# Patient Record
Sex: Female | Born: 1955
Health system: Southern US, Community
[De-identification: ages and names within clinical notes are randomized; demographics above are authoritative.]

## PROBLEM LIST (undated history)

## (undated) DIAGNOSIS — T8859XA Other complications of anesthesia, initial encounter: Secondary | ICD-10-CM

## (undated) DIAGNOSIS — R112 Nausea with vomiting, unspecified: Secondary | ICD-10-CM

## (undated) DIAGNOSIS — I10 Essential (primary) hypertension: Secondary | ICD-10-CM

## (undated) DIAGNOSIS — G8929 Other chronic pain: Secondary | ICD-10-CM

## (undated) DIAGNOSIS — F0390 Unspecified dementia without behavioral disturbance: Secondary | ICD-10-CM

## (undated) DIAGNOSIS — Z9889 Other specified postprocedural states: Secondary | ICD-10-CM

## (undated) DIAGNOSIS — E039 Hypothyroidism, unspecified: Secondary | ICD-10-CM

## (undated) DIAGNOSIS — T7840XA Allergy, unspecified, initial encounter: Secondary | ICD-10-CM

## (undated) DIAGNOSIS — G4733 Obstructive sleep apnea (adult) (pediatric): Secondary | ICD-10-CM

## (undated) DIAGNOSIS — R519 Headache, unspecified: Secondary | ICD-10-CM

## (undated) DIAGNOSIS — Z6841 Body Mass Index (BMI) 40.0 and over, adult: Secondary | ICD-10-CM

## (undated) DIAGNOSIS — J449 Chronic obstructive pulmonary disease, unspecified: Secondary | ICD-10-CM

## (undated) DIAGNOSIS — E0590 Thyrotoxicosis, unspecified without thyrotoxic crisis or storm: Secondary | ICD-10-CM

## (undated) DIAGNOSIS — R51 Headache: Secondary | ICD-10-CM

## (undated) DIAGNOSIS — R0600 Dyspnea, unspecified: Secondary | ICD-10-CM

## (undated) DIAGNOSIS — F32A Depression, unspecified: Secondary | ICD-10-CM

## (undated) DIAGNOSIS — R32 Unspecified urinary incontinence: Secondary | ICD-10-CM

## (undated) DIAGNOSIS — B49 Unspecified mycosis: Secondary | ICD-10-CM

## (undated) DIAGNOSIS — K219 Gastro-esophageal reflux disease without esophagitis: Secondary | ICD-10-CM

## (undated) DIAGNOSIS — S300XXA Contusion of lower back and pelvis, initial encounter: Secondary | ICD-10-CM

## (undated) DIAGNOSIS — J45909 Unspecified asthma, uncomplicated: Secondary | ICD-10-CM

## (undated) DIAGNOSIS — G43909 Migraine, unspecified, not intractable, without status migrainosus: Secondary | ICD-10-CM

## (undated) DIAGNOSIS — F4310 Post-traumatic stress disorder, unspecified: Secondary | ICD-10-CM

## (undated) DIAGNOSIS — F319 Bipolar disorder, unspecified: Secondary | ICD-10-CM

## (undated) DIAGNOSIS — T4145XA Adverse effect of unspecified anesthetic, initial encounter: Secondary | ICD-10-CM

## (undated) DIAGNOSIS — L0390 Cellulitis, unspecified: Secondary | ICD-10-CM

## (undated) DIAGNOSIS — F329 Major depressive disorder, single episode, unspecified: Secondary | ICD-10-CM

## (undated) DIAGNOSIS — R45851 Suicidal ideations: Secondary | ICD-10-CM

## (undated) DIAGNOSIS — I2510 Atherosclerotic heart disease of native coronary artery without angina pectoris: Secondary | ICD-10-CM

## (undated) DIAGNOSIS — E785 Hyperlipidemia, unspecified: Secondary | ICD-10-CM

## (undated) DIAGNOSIS — F419 Anxiety disorder, unspecified: Secondary | ICD-10-CM

## (undated) DIAGNOSIS — M1990 Unspecified osteoarthritis, unspecified site: Secondary | ICD-10-CM

## (undated) DIAGNOSIS — I219 Acute myocardial infarction, unspecified: Secondary | ICD-10-CM

## (undated) DIAGNOSIS — I509 Heart failure, unspecified: Secondary | ICD-10-CM

## (undated) DIAGNOSIS — Z9989 Dependence on other enabling machines and devices: Secondary | ICD-10-CM

## (undated) DIAGNOSIS — M549 Dorsalgia, unspecified: Secondary | ICD-10-CM

## (undated) HISTORY — PX: CHOLECYSTECTOMY: SHX55

## (undated) HISTORY — DX: Headache, unspecified: R51.9

## (undated) HISTORY — DX: Morbid (severe) obesity due to excess calories: E66.01

## (undated) HISTORY — DX: Allergy, unspecified, initial encounter: T78.40XA

## (undated) HISTORY — DX: Suicidal ideations: R45.851

## (undated) HISTORY — PX: TUBAL LIGATION: SHX77

## (undated) HISTORY — PX: CARDIAC CATHETERIZATION: SHX172

## (undated) HISTORY — DX: Unspecified asthma, uncomplicated: J45.909

## (undated) HISTORY — DX: Dependence on other enabling machines and devices: Z99.89

## (undated) HISTORY — DX: Depression, unspecified: F32.A

## (undated) HISTORY — DX: Contusion of lower back and pelvis, initial encounter: S30.0XXA

## (undated) HISTORY — DX: Unspecified mycosis: B49

## (undated) HISTORY — DX: Hyperlipidemia, unspecified: E78.5

## (undated) HISTORY — PX: ROTATOR CUFF REPAIR: SHX139

## (undated) HISTORY — DX: Unspecified osteoarthritis, unspecified site: M19.90

## (undated) HISTORY — PX: TONSILLECTOMY: SUR1361

## (undated) HISTORY — DX: Dyspnea, unspecified: R06.00

## (undated) HISTORY — DX: Atherosclerotic heart disease of native coronary artery without angina pectoris: I25.10

## (undated) HISTORY — DX: Other chronic pain: G89.29

## (undated) HISTORY — DX: Gastro-esophageal reflux disease without esophagitis: K21.9

## (undated) HISTORY — DX: Major depressive disorder, single episode, unspecified: F32.9

## (undated) HISTORY — PX: APPENDECTOMY: SHX54

## (undated) HISTORY — DX: Obstructive sleep apnea (adult) (pediatric): G47.33

## (undated) HISTORY — DX: Dorsalgia, unspecified: M54.9

## (undated) HISTORY — DX: Unspecified dementia, unspecified severity, without behavioral disturbance, psychotic disturbance, mood disturbance, and anxiety: F03.90

## (undated) HISTORY — PX: KNEE ARTHROSCOPY: SUR90

## (undated) HISTORY — PX: TOTAL VAGINAL HYSTERECTOMY: SHX2548

## (undated) HISTORY — DX: Other specified postprocedural states: Z98.890

## (undated) HISTORY — DX: Body Mass Index (BMI) 40.0 and over, adult: Z684

## (undated) HISTORY — PX: ABDOMINAL HYSTERECTOMY: SHX81

## (undated) HISTORY — DX: Anxiety disorder, unspecified: F41.9

## (undated) HISTORY — DX: Essential (primary) hypertension: I10

## (undated) HISTORY — DX: Bipolar disorder, unspecified: F31.9

## (undated) HISTORY — DX: Migraine, unspecified, not intractable, without status migrainosus: G43.909

## (undated) HISTORY — DX: Thyrotoxicosis, unspecified without thyrotoxic crisis or storm: E05.90

## (undated) HISTORY — PX: JOINT REPLACEMENT: SHX530

## (undated) HISTORY — DX: Chronic obstructive pulmonary disease, unspecified: J44.9

## (undated) HISTORY — DX: Post-traumatic stress disorder, unspecified: F43.10

## (undated) HISTORY — DX: Headache: R51

## (undated) HISTORY — DX: Unspecified urinary incontinence: R32

## (undated) HISTORY — DX: Cellulitis, unspecified: L03.90

---

## 1976-05-29 HISTORY — PX: HERNIA REPAIR: SHX51

## 2000-02-08 ENCOUNTER — Emergency Department (HOSPITAL_COMMUNITY): Admission: EM | Admit: 2000-02-08 | Discharge: 2000-02-08 | Payer: Self-pay | Admitting: Internal Medicine

## 2000-02-08 ENCOUNTER — Encounter: Payer: Self-pay | Admitting: Internal Medicine

## 2002-06-09 ENCOUNTER — Inpatient Hospital Stay (HOSPITAL_COMMUNITY): Admission: EM | Admit: 2002-06-09 | Discharge: 2002-06-11 | Payer: Self-pay | Admitting: Emergency Medicine

## 2002-06-11 ENCOUNTER — Encounter: Payer: Self-pay | Admitting: *Deleted

## 2002-09-24 ENCOUNTER — Encounter: Payer: Self-pay | Admitting: Unknown Physician Specialty

## 2002-09-24 ENCOUNTER — Ambulatory Visit (HOSPITAL_COMMUNITY): Admission: RE | Admit: 2002-09-24 | Discharge: 2002-09-24 | Payer: Self-pay | Admitting: Unknown Physician Specialty

## 2002-10-17 ENCOUNTER — Ambulatory Visit (HOSPITAL_COMMUNITY): Admission: RE | Admit: 2002-10-17 | Discharge: 2002-10-17 | Payer: Self-pay | Admitting: Internal Medicine

## 2002-10-17 DIAGNOSIS — Z9889 Other specified postprocedural states: Secondary | ICD-10-CM

## 2002-10-17 HISTORY — DX: Other specified postprocedural states: Z98.890

## 2002-10-17 HISTORY — PX: COLONOSCOPY: SHX174

## 2003-09-23 ENCOUNTER — Emergency Department (HOSPITAL_COMMUNITY): Admission: EM | Admit: 2003-09-23 | Discharge: 2003-09-24 | Payer: Self-pay | Admitting: *Deleted

## 2004-09-22 ENCOUNTER — Ambulatory Visit: Payer: Self-pay | Admitting: Cardiology

## 2004-10-21 ENCOUNTER — Ambulatory Visit: Payer: Self-pay | Admitting: Internal Medicine

## 2004-11-08 ENCOUNTER — Ambulatory Visit (HOSPITAL_COMMUNITY): Admission: RE | Admit: 2004-11-08 | Discharge: 2004-11-08 | Payer: Self-pay | Admitting: Internal Medicine

## 2004-11-08 ENCOUNTER — Ambulatory Visit: Payer: Self-pay | Admitting: Internal Medicine

## 2005-02-21 ENCOUNTER — Ambulatory Visit: Payer: Self-pay | Admitting: Internal Medicine

## 2005-04-28 ENCOUNTER — Ambulatory Visit: Payer: Self-pay | Admitting: Internal Medicine

## 2005-05-19 ENCOUNTER — Ambulatory Visit: Payer: Self-pay | Admitting: Internal Medicine

## 2005-08-08 ENCOUNTER — Encounter: Admission: RE | Admit: 2005-08-08 | Discharge: 2005-08-08 | Payer: Self-pay | Admitting: Internal Medicine

## 2006-01-10 ENCOUNTER — Emergency Department (HOSPITAL_COMMUNITY): Admission: EM | Admit: 2006-01-10 | Discharge: 2006-01-10 | Payer: Self-pay | Admitting: Emergency Medicine

## 2006-02-14 ENCOUNTER — Ambulatory Visit (HOSPITAL_COMMUNITY): Payer: Self-pay | Admitting: Psychiatry

## 2006-03-02 ENCOUNTER — Ambulatory Visit: Payer: Self-pay | Admitting: Licensed Clinical Social Worker

## 2006-03-05 ENCOUNTER — Ambulatory Visit (HOSPITAL_COMMUNITY): Payer: Self-pay | Admitting: Psychiatry

## 2006-03-30 ENCOUNTER — Ambulatory Visit: Payer: Self-pay | Admitting: Licensed Clinical Social Worker

## 2006-05-29 HISTORY — PX: BACK SURGERY: SHX140

## 2006-07-30 ENCOUNTER — Ambulatory Visit (HOSPITAL_COMMUNITY): Payer: Self-pay | Admitting: Psychiatry

## 2006-10-01 ENCOUNTER — Ambulatory Visit (HOSPITAL_COMMUNITY): Payer: Self-pay | Admitting: Psychiatry

## 2006-10-02 ENCOUNTER — Emergency Department (HOSPITAL_COMMUNITY): Admission: EM | Admit: 2006-10-02 | Discharge: 2006-10-02 | Payer: Self-pay | Admitting: Emergency Medicine

## 2007-05-30 LAB — HM COLONOSCOPY

## 2007-06-08 ENCOUNTER — Emergency Department (HOSPITAL_COMMUNITY): Admission: EM | Admit: 2007-06-08 | Discharge: 2007-06-08 | Payer: Self-pay | Admitting: Emergency Medicine

## 2007-07-03 ENCOUNTER — Ambulatory Visit (HOSPITAL_COMMUNITY): Payer: Self-pay | Admitting: Psychiatry

## 2007-07-17 ENCOUNTER — Ambulatory Visit: Payer: Self-pay | Admitting: Cardiovascular Disease

## 2008-11-12 ENCOUNTER — Emergency Department (HOSPITAL_COMMUNITY): Admission: EM | Admit: 2008-11-12 | Discharge: 2008-11-12 | Payer: Self-pay | Admitting: Emergency Medicine

## 2009-01-25 ENCOUNTER — Ambulatory Visit (HOSPITAL_COMMUNITY): Payer: Self-pay | Admitting: Psychiatry

## 2009-01-31 ENCOUNTER — Emergency Department (HOSPITAL_COMMUNITY): Admission: EM | Admit: 2009-01-31 | Discharge: 2009-01-31 | Payer: Self-pay | Admitting: Emergency Medicine

## 2009-02-12 DIAGNOSIS — E039 Hypothyroidism, unspecified: Secondary | ICD-10-CM | POA: Insufficient documentation

## 2009-02-12 DIAGNOSIS — F209 Schizophrenia, unspecified: Secondary | ICD-10-CM | POA: Insufficient documentation

## 2009-02-12 DIAGNOSIS — E7800 Pure hypercholesterolemia, unspecified: Secondary | ICD-10-CM | POA: Insufficient documentation

## 2009-02-15 ENCOUNTER — Ambulatory Visit: Payer: Self-pay | Admitting: Pulmonary Disease

## 2009-02-15 DIAGNOSIS — R0602 Shortness of breath: Secondary | ICD-10-CM | POA: Insufficient documentation

## 2009-02-15 DIAGNOSIS — I219 Acute myocardial infarction, unspecified: Secondary | ICD-10-CM | POA: Insufficient documentation

## 2009-02-23 ENCOUNTER — Encounter: Payer: Self-pay | Admitting: Pulmonary Disease

## 2009-02-23 ENCOUNTER — Ambulatory Visit: Payer: Self-pay

## 2009-02-23 ENCOUNTER — Ambulatory Visit (HOSPITAL_COMMUNITY): Admission: RE | Admit: 2009-02-23 | Discharge: 2009-02-23 | Payer: Self-pay | Admitting: Pulmonary Disease

## 2009-03-01 ENCOUNTER — Ambulatory Visit: Payer: Self-pay | Admitting: Gastroenterology

## 2009-03-01 DIAGNOSIS — K219 Gastro-esophageal reflux disease without esophagitis: Secondary | ICD-10-CM | POA: Insufficient documentation

## 2009-03-01 DIAGNOSIS — R1314 Dysphagia, pharyngoesophageal phase: Secondary | ICD-10-CM | POA: Insufficient documentation

## 2009-03-02 ENCOUNTER — Encounter: Payer: Self-pay | Admitting: Urgent Care

## 2009-03-02 ENCOUNTER — Ambulatory Visit (HOSPITAL_COMMUNITY): Payer: Self-pay | Admitting: Psychiatry

## 2009-03-05 ENCOUNTER — Ambulatory Visit: Payer: Self-pay | Admitting: Pulmonary Disease

## 2009-03-08 ENCOUNTER — Ambulatory Visit: Payer: Self-pay | Admitting: Pulmonary Disease

## 2009-03-12 ENCOUNTER — Ambulatory Visit: Payer: Self-pay | Admitting: Cardiology

## 2009-03-16 ENCOUNTER — Ambulatory Visit (HOSPITAL_COMMUNITY): Admission: RE | Admit: 2009-03-16 | Discharge: 2009-03-16 | Payer: Self-pay | Admitting: Pulmonary Disease

## 2009-03-16 ENCOUNTER — Encounter: Payer: Self-pay | Admitting: Pulmonary Disease

## 2009-03-18 ENCOUNTER — Ambulatory Visit (HOSPITAL_COMMUNITY): Admission: RE | Admit: 2009-03-18 | Discharge: 2009-03-18 | Payer: Self-pay | Admitting: Family Medicine

## 2009-03-18 ENCOUNTER — Encounter: Payer: Self-pay | Admitting: Gastroenterology

## 2009-03-18 ENCOUNTER — Ambulatory Visit: Payer: Self-pay | Admitting: Gastroenterology

## 2009-03-18 HISTORY — PX: ESOPHAGOGASTRODUODENOSCOPY: SHX1529

## 2009-03-24 ENCOUNTER — Telehealth (INDEPENDENT_AMBULATORY_CARE_PROVIDER_SITE_OTHER): Payer: Self-pay

## 2009-03-29 ENCOUNTER — Ambulatory Visit: Payer: Self-pay | Admitting: Pulmonary Disease

## 2009-03-29 DIAGNOSIS — K7689 Other specified diseases of liver: Secondary | ICD-10-CM | POA: Insufficient documentation

## 2009-05-11 ENCOUNTER — Ambulatory Visit (HOSPITAL_COMMUNITY): Payer: Self-pay | Admitting: Psychiatry

## 2009-06-16 ENCOUNTER — Ambulatory Visit: Payer: Self-pay | Admitting: Gastroenterology

## 2009-06-16 DIAGNOSIS — K5909 Other constipation: Secondary | ICD-10-CM | POA: Insufficient documentation

## 2009-06-18 ENCOUNTER — Encounter: Payer: Self-pay | Admitting: Gastroenterology

## 2009-06-21 ENCOUNTER — Ambulatory Visit (HOSPITAL_COMMUNITY): Admission: RE | Admit: 2009-06-21 | Discharge: 2009-06-21 | Payer: Self-pay | Admitting: Gastroenterology

## 2009-08-19 ENCOUNTER — Ambulatory Visit (HOSPITAL_COMMUNITY): Payer: Self-pay | Admitting: Psychiatry

## 2009-09-22 ENCOUNTER — Ambulatory Visit (HOSPITAL_COMMUNITY): Payer: Self-pay | Admitting: Psychology

## 2009-09-29 ENCOUNTER — Encounter (INDEPENDENT_AMBULATORY_CARE_PROVIDER_SITE_OTHER): Payer: Self-pay | Admitting: *Deleted

## 2009-10-19 ENCOUNTER — Ambulatory Visit (HOSPITAL_COMMUNITY): Payer: Self-pay | Admitting: Psychiatry

## 2009-10-21 ENCOUNTER — Ambulatory Visit (HOSPITAL_COMMUNITY): Admission: RE | Admit: 2009-10-21 | Discharge: 2009-10-21 | Payer: Self-pay | Admitting: Family Medicine

## 2009-11-30 ENCOUNTER — Ambulatory Visit (HOSPITAL_COMMUNITY): Payer: Self-pay | Admitting: Psychology

## 2010-03-10 ENCOUNTER — Ambulatory Visit (HOSPITAL_COMMUNITY): Payer: Self-pay | Admitting: Psychiatry

## 2010-04-29 ENCOUNTER — Ambulatory Visit (HOSPITAL_COMMUNITY): Payer: Self-pay | Admitting: Psychology

## 2010-05-03 ENCOUNTER — Ambulatory Visit (HOSPITAL_COMMUNITY): Payer: Self-pay | Admitting: Psychiatry

## 2010-05-31 ENCOUNTER — Ambulatory Visit (HOSPITAL_COMMUNITY)
Admission: RE | Admit: 2010-05-31 | Discharge: 2010-05-31 | Payer: Self-pay | Source: Home / Self Care | Attending: Psychiatry | Admitting: Psychiatry

## 2010-06-01 ENCOUNTER — Ambulatory Visit (HOSPITAL_COMMUNITY): Admit: 2010-06-01 | Payer: Self-pay | Admitting: Psychology

## 2010-06-01 ENCOUNTER — Encounter: Payer: Self-pay | Admitting: Internal Medicine

## 2010-06-30 NOTE — Assessment & Plan Note (Signed)
Summary: DYSPHAGIA/constipation   Visit Type:  Follow-up Visit Primary Care Provider:  Thora Lance, M.D.  Chief Complaint:  F/U procedure/dysphagia.  History of Present Illness: Pt concerned about her weight. Swallowing is good. If doesn't chop up meat then she gets choked on it. BMs: can go a week without bowel smoving. Not using meds regularly. Using Miralax and Dulcolax works for constipation. Never tried Amitiza. Has a lot of gas. And has it at appropriate. Gained 100 lbs in one year but doesn't understand why. Weighed 370 lbs last week. Has lost 10 lbs.  Current Medications (verified): 1)  Gabapentin 300 Mg Caps (Gabapentin) .... Take 4 Tablets Daily 2)  Cyclobenzaprine Hcl 10 Mg Tabs (Cyclobenzaprine Hcl) .... As Needed 3)  Zoloft 100 Mg Tabs (Sertraline Hcl) .... 2 By Mouth Daily 4)  Depakote 500 Mg Tbec (Divalproex Sodium) .... Take 1 Tablet By Mouth Two Times A Day 5)  Seroquel Xr 150 Mg Xr24h-Tab (Quetiapine Fumarate) .... Take 1 Tab By Mouth At Bedtime 6)  Aricept 10 Mg Tabs (Donepezil Hcl) .Marland Kitchen.. 1 By Mouth Daily 7)  Levoxyl 125 Mcg Tabs (Levothyroxine Sodium) .... Take 1 Tablet By Mouth Once A Day 8)  Verapamil Hcl Cr 180 Mg Cr-Tabs (Verapamil Hcl) .Marland Kitchen.. 1 By Mouth Daily 9)  Furosemide 40 Mg Tabs (Furosemide) .... Take 1 Tablet By Mouth Every Morning 10)  Simvastatin 40 Mg Tabs (Simvastatin) .... Take 1 Tablet By Mouth Once A Day 11)  Niaspan 500 Mg Cr-Tabs (Niacin (Antihyperlipidemic)) .... Take 1 Tab By Mouth At Bedtime 12)  Aspirin 81 Mg  Tabs (Aspirin) .... Take 1 Tablet By Mouth Once A Day 13)  Multivitamins   Tabs (Multiple Vitamin) .... Take 1 Tablet By Mouth Once A Day 14)  Vitamin D (Ergocalciferol) 50000 Unit Caps (Ergocalciferol) .... Once Weekly 15)  Omeprazole 20 Mg Cpdr (Omeprazole) .... One By Mouth Two Times A Day For Acid Reflux 16)  Restoril 15 Mg Caps (Temazepam) .Marland Kitchen.. 1 By Mouth At Bedtime  Allergies (verified): No Known Drug Allergies  Past History:  Past  Medical History: Last updated: 03/29/2009 Hyperlipidemia Hypertension CAD, hx MIs  Dr Dietrich Pates Bipolar disorder Schizophrenia GERD OSA on CPAP Hyperthyroidism s/p radio-ablation with hypothyroidism Chronic back pain with neuropathy Morbid obesity Dyspnea      - PFT 03/05/09 FEV1 2.77(98%), FVC 3.25(86%), FEV1% 85, TLC 5.88(99%), DLCO 60%      - Methacholine challenge 03/16/09 normal      - CT chest 03/12/09 no pulmonary disease Colonoscopy 10/17/2002 by Dr Karilyn Cota distal non-specific proctitis, small ext hemorrhoids,  Anxiety Disorder Arthritis Depression  Past Surgical History: Last updated: 03/01/2009 Cholecystectomy Knee Arthroscopy Hysterectomy total Appendectomy Tonsillectomy  Family History: Last updated: 03/01/2009 No known family history of colorectal carcinoma, IBD, liver or chronic GI problems. Father - CAD  Mother - Cancer age 93, HTN Brother - deceased HTN, CAD  Social History: Last updated: 03/01/2009 Marital Status: divorced, lives with niece Children: Two kids Occupation: disabled, due to back problems Patient never smoked.  Illicit Drug Use - no Patient does not get regular exercise.   Vital Signs:  Patient profile:   55 year old female Height:      69 inches Weight:      370 pounds BMI:     54.84 Temp:     97.9 degrees F oral Pulse rate:   88 / minute BP sitting:   132 / 94  (left arm) Cuff size:   large  Vitals Entered By: Cloria Spring LPN (  June 16, 2009 9:40 AM)  Physical Exam  General:  Well developed, well nourished, no acute distress. Head:  Normocephalic and atraumatic. Eyes:  PERRLA, no icterus. Mouth:  No deformity or lesions. Lungs:  Clear throughout to auscultation. Heart:  Regular rate and rhythm; no murmurs Abdomen:  obese, soft, non-tender, no masses Extremities:  no cyanosis Neurologic:  Alert and  oriented x4;  grossly normal neurologically. Walks assisted with a cane.  Impression & Recommendations:  Problem #  1:  DYSPHAGIA, PHARYNGOESOPHAGEAL PHASE (ZOX-096.04) Assessment Unchanged  Afetr dilation, still chopping up meats. BaSw. No indication for additional dilation.  Orders: Est. Patient Level V (54098)  Problem # 2:  CONSTIPATION, CHRONIC (ICD-564.09) Assessment: Unchanged  Not getting the ideal response with hig fiber diet, water, and Miralax. Add Amitiza and Miralax. OPV in 4 mos.  Orders: Est. Patient Level V (11914)  Problem # 3:  MORBID OBESITY (ICD-278.01) Assessment: Unchanged Pt's insurance co sent information RE: lap band. Refer to [facility]. Spoke with [facility]. Pt must complete weight loss seminar at [facility] prior to being seen. Given # to enroll. Will fax records to [facility].  CC: Dr. Rudi Heap  Patient Instructions: 1)  Continue omeprazole daily. 2)  ADD BENEFIBER 2 TSP EVERY MORNING. 3)  ADD AMITIZA 24 micrograms at night for 7 days then take twice a day. 4)  Call [facility] [phone] and ask for free enrollment class for weight loss. All information will be faxed to [facility]. 5)  Return visit in 4 months. 6)  The medication list was reviewed and reconciled.  All changed / newly prescribed medications were explained.  A complete medication list was provided to the patient / caregiver. Prescriptions: AMITIZA 24 MCG CAPS (LUBIPROSTONE) 1 by mouth two times a day. May cause nausea.  #60 x 5   Entered and Authorized by:   West Bali MD   Signed by:   West Bali MD on 06/16/2009   Method used:   Print then Give to Patient   RxID:   [id number]

## 2010-06-30 NOTE — Letter (Signed)
Summary: BARIUM SWALLOW ORDER  BARIUM SWALLOW ORDER   Imported By: Ave Filter 06/18/2009 12:51:03  _____________________________________________________________________  External Attachment:    Type:   Image     Comment:   External Document

## 2010-06-30 NOTE — Letter (Signed)
Summary: Recall Office Visit  [facility]  [address]   [place], [place] [address]   Phone: [phone]  Fax: [phone]      Sep 29, 2009   Allison Sharp [address] [place], [place]  [address] [birth year]   Dear [name],   According to our records, it is time for you to schedule a follow-up office visit with [name].   At your convenience, please call [phone] to schedule an office visit. If you have any questions, concerns, or feel that this letter is in error, we would appreciate your call.   Sincerely,    Diana Eves  [facility] Ph: [phone]   Fax: [phone]

## 2010-07-05 ENCOUNTER — Encounter (INDEPENDENT_AMBULATORY_CARE_PROVIDER_SITE_OTHER): Payer: BC Managed Care – PPO | Admitting: Psychology

## 2010-07-05 DIAGNOSIS — F329 Major depressive disorder, single episode, unspecified: Secondary | ICD-10-CM

## 2010-07-05 DIAGNOSIS — F09 Unspecified mental disorder due to known physiological condition: Secondary | ICD-10-CM

## 2010-07-05 DIAGNOSIS — F3289 Other specified depressive episodes: Secondary | ICD-10-CM

## 2010-07-12 ENCOUNTER — Encounter (INDEPENDENT_AMBULATORY_CARE_PROVIDER_SITE_OTHER): Payer: BC Managed Care – PPO | Admitting: Psychiatry

## 2010-07-12 DIAGNOSIS — F3189 Other bipolar disorder: Secondary | ICD-10-CM

## 2010-07-26 ENCOUNTER — Encounter (HOSPITAL_COMMUNITY): Payer: BC Managed Care – PPO | Admitting: Psychiatry

## 2010-07-27 ENCOUNTER — Encounter (HOSPITAL_COMMUNITY): Payer: Self-pay | Admitting: Psychology

## 2010-08-03 ENCOUNTER — Encounter (HOSPITAL_COMMUNITY): Payer: Self-pay | Admitting: Psychology

## 2010-08-04 ENCOUNTER — Encounter (INDEPENDENT_AMBULATORY_CARE_PROVIDER_SITE_OTHER): Payer: BC Managed Care – PPO | Admitting: Psychiatry

## 2010-08-04 DIAGNOSIS — F3189 Other bipolar disorder: Secondary | ICD-10-CM

## 2010-08-08 ENCOUNTER — Encounter: Payer: Self-pay | Admitting: Internal Medicine

## 2010-08-08 LAB — PULMONARY FUNCTION TEST

## 2010-08-09 ENCOUNTER — Emergency Department (HOSPITAL_COMMUNITY)
Admission: EM | Admit: 2010-08-09 | Discharge: 2010-08-09 | Disposition: A | Discharge status: Home / Self Care | Payer: Medicare Other | Attending: Emergency Medicine | Admitting: Emergency Medicine

## 2010-08-09 ENCOUNTER — Emergency Department (HOSPITAL_COMMUNITY): Payer: Medicare Other

## 2010-08-09 DIAGNOSIS — I2510 Atherosclerotic heart disease of native coronary artery without angina pectoris: Secondary | ICD-10-CM | POA: Insufficient documentation

## 2010-08-09 DIAGNOSIS — E7800 Pure hypercholesterolemia, unspecified: Secondary | ICD-10-CM | POA: Insufficient documentation

## 2010-08-09 DIAGNOSIS — M25569 Pain in unspecified knee: Secondary | ICD-10-CM | POA: Insufficient documentation

## 2010-08-09 DIAGNOSIS — Y929 Unspecified place or not applicable: Secondary | ICD-10-CM | POA: Insufficient documentation

## 2010-08-09 DIAGNOSIS — W010XXA Fall on same level from slipping, tripping and stumbling without subsequent striking against object, initial encounter: Secondary | ICD-10-CM | POA: Insufficient documentation

## 2010-08-09 DIAGNOSIS — R609 Edema, unspecified: Secondary | ICD-10-CM | POA: Insufficient documentation

## 2010-08-09 DIAGNOSIS — E039 Hypothyroidism, unspecified: Secondary | ICD-10-CM | POA: Insufficient documentation

## 2010-08-09 DIAGNOSIS — F319 Bipolar disorder, unspecified: Secondary | ICD-10-CM | POA: Insufficient documentation

## 2010-08-09 DIAGNOSIS — S8000XA Contusion of unspecified knee, initial encounter: Secondary | ICD-10-CM | POA: Insufficient documentation

## 2010-08-09 DIAGNOSIS — M545 Low back pain, unspecified: Secondary | ICD-10-CM | POA: Insufficient documentation

## 2010-08-09 DIAGNOSIS — Z79899 Other long term (current) drug therapy: Secondary | ICD-10-CM | POA: Insufficient documentation

## 2010-08-09 DIAGNOSIS — I509 Heart failure, unspecified: Secondary | ICD-10-CM | POA: Insufficient documentation

## 2010-08-09 DIAGNOSIS — I10 Essential (primary) hypertension: Secondary | ICD-10-CM | POA: Insufficient documentation

## 2010-08-09 DIAGNOSIS — I252 Old myocardial infarction: Secondary | ICD-10-CM | POA: Insufficient documentation

## 2010-08-09 DIAGNOSIS — Z9889 Other specified postprocedural states: Secondary | ICD-10-CM | POA: Insufficient documentation

## 2010-08-09 DIAGNOSIS — M25559 Pain in unspecified hip: Secondary | ICD-10-CM | POA: Insufficient documentation

## 2010-08-16 ENCOUNTER — Encounter: Payer: Self-pay | Admitting: Cardiology

## 2010-08-22 ENCOUNTER — Encounter (INDEPENDENT_AMBULATORY_CARE_PROVIDER_SITE_OTHER): Payer: No Typology Code available for payment source | Admitting: Psychology

## 2010-08-22 DIAGNOSIS — F329 Major depressive disorder, single episode, unspecified: Secondary | ICD-10-CM

## 2010-08-22 DIAGNOSIS — F09 Unspecified mental disorder due to known physiological condition: Secondary | ICD-10-CM

## 2010-08-22 DIAGNOSIS — F3289 Other specified depressive episodes: Secondary | ICD-10-CM

## 2010-08-25 ENCOUNTER — Encounter: Payer: Self-pay | Admitting: Internal Medicine

## 2010-08-25 ENCOUNTER — Institutional Professional Consult (permissible substitution): Payer: BC Managed Care – PPO | Admitting: Cardiovascular Disease

## 2010-08-25 DIAGNOSIS — R0602 Shortness of breath: Secondary | ICD-10-CM

## 2010-08-26 ENCOUNTER — Encounter: Payer: Self-pay | Admitting: Internal Medicine

## 2010-08-26 ENCOUNTER — Ambulatory Visit (INDEPENDENT_AMBULATORY_CARE_PROVIDER_SITE_OTHER): Payer: BC Managed Care – PPO | Admitting: Internal Medicine

## 2010-08-26 VITALS — BP 132/84 | HR 79 | Ht 69.0 in | Wt 394.0 lb

## 2010-08-26 DIAGNOSIS — R079 Chest pain, unspecified: Secondary | ICD-10-CM | POA: Insufficient documentation

## 2010-08-26 DIAGNOSIS — E7800 Pure hypercholesterolemia, unspecified: Secondary | ICD-10-CM

## 2010-08-26 DIAGNOSIS — R0602 Shortness of breath: Secondary | ICD-10-CM

## 2010-08-26 DIAGNOSIS — I2510 Atherosclerotic heart disease of native coronary artery without angina pectoris: Secondary | ICD-10-CM | POA: Insufficient documentation

## 2010-08-26 NOTE — Patient Instructions (Addendum)
Your physician has requested that you have a lexiscan myoview. For further information please visit [email or web address] Please follow instruction sheet, as given.  786.05

## 2010-08-26 NOTE — Assessment & Plan Note (Signed)
Keep on the same medical regimen. Will need to be followed

## 2010-08-26 NOTE — Assessment & Plan Note (Signed)
Counseled on the importance of weight loss.  Indeed, this may explain a lot of her symptoms.

## 2010-08-26 NOTE — Progress Notes (Signed)
HPI Patient  is a 55 year old Who was referred by Dr. Orson Aloe and Dr. Stacie Acres for evaluation of chest pain.  The patient has been seen by at [facility] in the past. She has undergone a few cardiac catheterizations. She reportedly had a myocardial infarction back in 1997. She has also been seen by Glennon Hamilton, and Dr Eden Emms. She she had a nuclear stress test back in 2004 that showed no ischemia. She was seen in clinic by Dr. Eden Emms and not set for followup after 2009.  The patient has had intermittent chest pains per her sister.  This is  getting worse. She has chest pain, arm pain/numbness, neck discomfort. She has progressive shortness of breath. She has also had a problem with retaining fluid. She had PFTs done that showed mild small airway obstruction. She is on CPAP and now oxygen is added. She increased her fluid pill by Dr. Orson Aloe and noted no reall difference in her symptoms. Overall, she is fatigued all the time.  Allergies not on file  Current Outpatient Prescriptions  Medication Sig Dispense Refill  . ARIPiprazole (ABILIFY) 10 MG tablet Take 10 mg by mouth daily.        Marland Kitchen aspirin 81 MG EC tablet Take 81 mg by mouth daily.        Marland Kitchen atorvastatin (LIPITOR) 40 MG tablet Take 40 mg by mouth daily.        . diazepam (VALIUM) 5 MG tablet Take 5 mg by mouth daily.        . divalproex (DEPAKOTE) 500 MG EC tablet Take 1,000 mg by mouth at bedtime.       . ergocalciferol (VITAMIN D2) 50000 UNITS capsule Take 50,000 Units by mouth once a week.        . furosemide (LASIX) 40 MG tablet Take 40 mg by mouth daily.        Marland Kitchen gabapentin (NEURONTIN) 300 MG capsule Take 1 tab in the AM, 2 tabs at noon, 2 tabs bedtime      . HYDROcodone-acetaminophen (VICODIN) 5-500 MG per tablet Take 1 tablet by mouth every 6 (six) hours as needed.        Marland Kitchen levothyroxine (SYNTHROID, LEVOTHROID) 125 MCG tablet Take 125 mcg by mouth daily.        Marland Kitchen lubiprostone (AMITIZA) 24 MCG capsule Take 24  mcg by mouth 2 (two) times daily with a meal.        . methocarbamol (ROBAXIN) 750 MG tablet Take 750 mg by mouth 2 (two) times daily.        . Multiple Vitamin (MULTIVITAMIN) tablet Take 1 tablet by mouth daily.        . niacin (NIASPAN) 500 MG CR tablet Take 2,000 mg by mouth at bedtime.       Marland Kitchen omeprazole (PRILOSEC) 20 MG capsule Take 20 mg by mouth 2 (two) times daily.       Marland Kitchen oxyCODONE-acetaminophen (PERCOCET) 5-325 MG per tablet Take 1 tablet by mouth every 4 (four) hours as needed.        . promethazine (PHENERGAN) 25 MG tablet Take 25 mg by mouth every 6 (six) hours as needed.        . verapamil (COVERA HS) 180 MG (CO) 24 hr tablet Take 180 mg by mouth at bedtime.        . cyclobenzaprine (FLEXERIL) 10 MG tablet Take 10 mg by mouth 3 (three) times daily as needed.        Marland Kitchen  donepezil (ARICEPT) 10 MG tablet Take 10 mg by mouth at bedtime as needed.        Marland Kitchen QUEtiapine Fumarate (SEROQUEL XR) 150 MG TB24 Take by mouth.        . temazepam (RESTORIL) 7.5 MG capsule Take 7.5 mg by mouth at bedtime as needed.        Marland Kitchen DISCONTD: simvastatin (ZOCOR) 40 MG tablet Take 40 mg by mouth at bedtime.          Past Medical History  Diagnosis Date  . Hyperlipidemia   . CAD (coronary artery disease)   . Bipolar 1 disorder   . Schizophrenia   . GERD (gastroesophageal reflux disease)   . Hyperthyroidism   . Chronic back pain   . Morbid obesity   . Dyspnea   . Anxiety   . Arthritis   . Depression   . OSA on CPAP     2 liters    Past Surgical History  Procedure Date  . Cholecystectomy   . Knee arthroscopy   . Appendectomy   . Tonsillectomy   . Back surgery   . Total vaginal hysterectomy     Family History  Problem Relation Age of Onset  . Cancer Mother   . Hypertension Mother   . Coronary artery disease Father   . Hypertension Brother   . Coronary artery disease Brother     History   Social History  . Marital Status: Single    Spouse Name: N/A    Number of Children: N/A  .  Years of Education: N/A   Occupational History  . Not on file.   Social History Main Topics  . Smoking status: Never Smoker   . Smokeless tobacco: Not on file  . Alcohol Use: No  . Drug Use: No  . Sexually Active:    Other Topics Concern  . Not on file   Social History Narrative  . No narrative on file    Review of Systems:  All systems reviewed.  They are negative to the above problem except as previously stated.  Vital Signs: BP 132/84  Pulse 79  Ht 5\' 9"  (1.753 m)  Wt 394 lb (178.717 kg)  BMI 58.18 kg/m2  Physical Exam Pateint is a morbidly obese 55 year old in NAD  HEENT:  Normocephalic, atraumatic. EOMI, PERRLA.  Neck: JVP is normal. . No bruits.  Lungs: clear to auscultation. No rales no wheezes.  Heart: Regular rate and rhythm. Normal S1, S2. No S3.   No significant murmurs. PMI not displaced.  Abdomen:  Supple, nontender. Normal bowel sounds. No obvious masses. No hepatomegaly.  Extremities:   Good distal pulses throughout. No pitting edema. Musculoskeletal :moving all extremities.  Neuro:   alert and oriented x3.  CN II-XII grossly intact.  EKG:NSR  79 bpm.   Assessment and Plan:

## 2010-08-26 NOTE — Assessment & Plan Note (Signed)
Evaluation in process. Diastolic dysfunction on echo. Mild obstructive airway disease on PFTs.  Also has CPAP because of sleep apnea encouraged her to continue to use this

## 2010-08-26 NOTE — Assessment & Plan Note (Signed)
Patient is a 55 year old with mild coronary artery disease by report. I have not seen the cath reports from the [facility]. She had a normal Myoview in 2004 in [place]. She comes in today with increasing chest pain, increasing shortness of breath. An echocardiogram was done at [facility] yesterday. This showed normal LV systolic function, but abnormal diastolic function. This may be explaining some or maybe all of her symptoms. I would, though recommend a Myoview to rule out inducible ischemia. Hopefully, this can be done in a 2 day protocol.  I have asked the patient to keep on her same regimen. I will also check a BMET and a BNP today. In the outside labs her BNP was elevated. In addition, I have asked her to sign a release of information so we can get the records from [facility].

## 2010-08-27 LAB — BASIC METABOLIC PANEL
BUN: 15 mg/dL (ref 6–23)
CO2: 25 mEq/L (ref 19–32)
Calcium: 9.6 mg/dL (ref 8.4–10.5)
Sodium: 141 mEq/L (ref 135–145)

## 2010-08-27 LAB — TSH: TSH: 3.234 u[IU]/mL (ref 0.350–4.500)

## 2010-08-29 ENCOUNTER — Telehealth: Payer: Self-pay | Admitting: *Deleted

## 2010-08-29 ENCOUNTER — Telehealth: Payer: Self-pay | Admitting: Cardiology

## 2010-08-29 NOTE — Telephone Encounter (Signed)
Received phone call from Al Pimple [facility] concerning inability to schedule 2 day myoview because of weight limit of the table at 350lbs.  The patient weighs 394 lbs and the study can only be performed at [facility]. Awilda Metro that I will discuss with Dr.Ross to see if she still would like study to be performed.

## 2010-08-29 NOTE — Telephone Encounter (Signed)
ROI faxed to [facility] @ [phone] 08/29/10/KM

## 2010-08-31 ENCOUNTER — Telehealth (HOSPITAL_COMMUNITY): Payer: Self-pay | Admitting: Radiology

## 2010-08-31 NOTE — Telephone Encounter (Signed)
Pt wants to know if the Nuclear Study is going to be done. States she is stilll having significant SOB.

## 2010-09-01 ENCOUNTER — Encounter (HOSPITAL_COMMUNITY): Payer: BC Managed Care – PPO | Admitting: Psychiatry

## 2010-09-01 NOTE — Telephone Encounter (Signed)
Spoke to patient's sister.  Unfortunately she is to heavy to undergo myoview.  Would have to go to another hospital system.  I can forward records if needed.  I would not change regimen.

## 2010-09-02 LAB — DIFFERENTIAL
Basophils Absolute: 0 10*3/uL (ref 0.0–0.1)
Basophils Relative: 1 % (ref 0–1)
Monocytes Relative: 10 % (ref 3–12)
Neutro Abs: 3.6 10*3/uL (ref 1.7–7.7)
Neutrophils Relative %: 54 % (ref 43–77)

## 2010-09-02 LAB — COMPREHENSIVE METABOLIC PANEL
Alkaline Phosphatase: 72 U/L (ref 39–117)
BUN: 12 mg/dL (ref 6–23)
Glucose, Bld: 101 mg/dL — ABNORMAL HIGH (ref 70–99)
Potassium: 3.5 mEq/L (ref 3.5–5.1)
Total Bilirubin: 0.5 mg/dL (ref 0.3–1.2)
Total Protein: 6.7 g/dL (ref 6.0–8.3)

## 2010-09-02 LAB — CBC
HCT: 39 % (ref 36.0–46.0)
Hemoglobin: 13.6 g/dL (ref 12.0–15.0)
MCHC: 35 g/dL (ref 30.0–36.0)
MCV: 90.1 fL (ref 78.0–100.0)
RDW: 13.6 % (ref 11.5–15.5)

## 2010-09-02 LAB — POCT CARDIAC MARKERS
CKMB, poc: <1 ng/mL — ABNORMAL LOW (ref 1.0–8.0)
Troponin i, poc: <0.05 ng/mL (ref 0.00–0.09)

## 2010-09-02 LAB — BRAIN NATRIURETIC PEPTIDE: Pro B Natriuretic peptide (BNP): <30 pg/mL (ref 0.0–100.0)

## 2010-09-05 LAB — BASIC METABOLIC PANEL
BUN: 8 mg/dL (ref 6–23)
Calcium: 9.3 mg/dL (ref 8.4–10.5)
Creatinine, Ser: 0.65 mg/dL (ref 0.4–1.2)
GFR calc non Af Amer: >60 mL/min (ref >60)

## 2010-09-05 LAB — POCT CARDIAC MARKERS: Troponin i, poc: <0.05 ng/mL (ref 0.00–0.09)

## 2010-09-05 LAB — URINALYSIS, ROUTINE W REFLEX MICROSCOPIC
Glucose, UA: NEGATIVE mg/dL
Protein, ur: NEGATIVE mg/dL
pH: 7 (ref 5.0–8.0)

## 2010-09-05 LAB — CBC
Platelets: 141 10*3/uL — ABNORMAL LOW (ref 150–400)
WBC: 7.4 10*3/uL (ref 4.0–10.5)

## 2010-09-05 LAB — DIFFERENTIAL
Eosinophils Absolute: 0.3 10*3/uL (ref 0.0–0.7)
Lymphocytes Relative: 27 % (ref 12–46)
Lymphs Abs: 2 10*3/uL (ref 0.7–4.0)
Neutrophils Relative %: 60 % (ref 43–77)

## 2010-09-06 ENCOUNTER — Telehealth: Payer: Self-pay | Admitting: *Deleted

## 2010-09-06 NOTE — Telephone Encounter (Signed)
I spoke with patient's sister Clydie Braun) and advised her that we set the patient up for a 2 day Steffanie Dunn at [facility] for 4/18 and 4/19. She is to check in to the hospital at the [facility] Admitting at 930am on 4/18. All instructions given to Clydie Braun and she will let her sister know .

## 2010-09-06 NOTE — Progress Notes (Signed)
Addended by: Hardin Negus on: 09/06/2010 06:17 PM   Modules accepted: Orders

## 2010-09-07 NOTE — Progress Notes (Signed)
Addended by: Hardin Negus on: 09/07/2010 12:53 PM   Modules accepted: Orders

## 2010-09-08 ENCOUNTER — Telehealth: Payer: Self-pay | Admitting: Internal Medicine

## 2010-09-08 NOTE — Telephone Encounter (Signed)
Pt calls today b/c she is concerned that [facility] will not be able to accomodate her in future needs such as a cath because of her size ([id number]).  This thought arises b/c she could not have her myoview done at [facility] due to size.  She is having her myoview performed at [facility] on 4/18-4/19. I called the cath lab and inquired on max weight limit ( 450lb) and reassured pt that if she should need another cath they could accomodate her. Pt is reassured and relieved. She understands [facility] does have beds for persons of her size. She would also like to know when she should follow-up up with Dr. Tenny Craw.  I told her I would forward this to Dr. Tenny Craw for review. Mylo Red RN

## 2010-09-19 ENCOUNTER — Emergency Department (HOSPITAL_COMMUNITY): Payer: No Typology Code available for payment source

## 2010-09-19 ENCOUNTER — Emergency Department (HOSPITAL_COMMUNITY)
Admission: EM | Admit: 2010-09-19 | Discharge: 2010-09-19 | Disposition: A | Discharge status: Home / Self Care | Payer: No Typology Code available for payment source | Attending: Emergency Medicine | Admitting: Emergency Medicine

## 2010-09-19 DIAGNOSIS — Z7982 Long term (current) use of aspirin: Secondary | ICD-10-CM | POA: Insufficient documentation

## 2010-09-19 DIAGNOSIS — E7800 Pure hypercholesterolemia, unspecified: Secondary | ICD-10-CM | POA: Insufficient documentation

## 2010-09-19 DIAGNOSIS — W208XXA Other cause of strike by thrown, projected or falling object, initial encounter: Secondary | ICD-10-CM | POA: Insufficient documentation

## 2010-09-19 DIAGNOSIS — E039 Hypothyroidism, unspecified: Secondary | ICD-10-CM | POA: Insufficient documentation

## 2010-09-19 DIAGNOSIS — S91109A Unspecified open wound of unspecified toe(s) without damage to nail, initial encounter: Secondary | ICD-10-CM | POA: Insufficient documentation

## 2010-09-19 DIAGNOSIS — I252 Old myocardial infarction: Secondary | ICD-10-CM | POA: Insufficient documentation

## 2010-09-19 DIAGNOSIS — I10 Essential (primary) hypertension: Secondary | ICD-10-CM | POA: Insufficient documentation

## 2010-09-19 DIAGNOSIS — Z79899 Other long term (current) drug therapy: Secondary | ICD-10-CM | POA: Insufficient documentation

## 2010-09-19 DIAGNOSIS — I2510 Atherosclerotic heart disease of native coronary artery without angina pectoris: Secondary | ICD-10-CM | POA: Insufficient documentation

## 2010-09-19 DIAGNOSIS — F319 Bipolar disorder, unspecified: Secondary | ICD-10-CM | POA: Insufficient documentation

## 2010-09-19 DIAGNOSIS — G4730 Sleep apnea, unspecified: Secondary | ICD-10-CM | POA: Insufficient documentation

## 2010-09-19 DIAGNOSIS — I509 Heart failure, unspecified: Secondary | ICD-10-CM | POA: Insufficient documentation

## 2010-09-21 NOTE — Telephone Encounter (Signed)
Records received form [facility] [place] gave to [facility] 09/21/10/KM

## 2010-09-23 NOTE — Telephone Encounter (Signed)
Called patient and advised that we did not get nuc stress test report from [facility] that was done on 4/18 and 4/19. She will call them on 4/30and have them fax the report.  She is aware that she needs this for her follow up appointment with Dr.Ross.

## 2010-09-26 NOTE — Telephone Encounter (Signed)
Additional records received from [facility]-given to [facility] 4/30.rmf

## 2010-09-27 ENCOUNTER — Encounter (HOSPITAL_COMMUNITY): Payer: BC Managed Care – PPO | Admitting: Psychiatry

## 2010-09-28 ENCOUNTER — Encounter: Payer: Self-pay | Admitting: Internal Medicine

## 2010-09-29 ENCOUNTER — Encounter (HOSPITAL_COMMUNITY): Payer: BC Managed Care – PPO | Admitting: Psychology

## 2010-09-29 ENCOUNTER — Encounter (INDEPENDENT_AMBULATORY_CARE_PROVIDER_SITE_OTHER): Payer: No Typology Code available for payment source | Admitting: Psychiatry

## 2010-09-29 DIAGNOSIS — F3189 Other bipolar disorder: Secondary | ICD-10-CM

## 2010-09-30 ENCOUNTER — Encounter: Payer: Self-pay | Admitting: Internal Medicine

## 2010-09-30 ENCOUNTER — Ambulatory Visit (INDEPENDENT_AMBULATORY_CARE_PROVIDER_SITE_OTHER): Payer: Medicare Other | Admitting: Internal Medicine

## 2010-09-30 DIAGNOSIS — I2510 Atherosclerotic heart disease of native coronary artery without angina pectoris: Secondary | ICD-10-CM

## 2010-09-30 DIAGNOSIS — R0989 Other specified symptoms and signs involving the circulatory and respiratory systems: Secondary | ICD-10-CM

## 2010-10-04 ENCOUNTER — Telehealth: Payer: Self-pay | Admitting: Internal Medicine

## 2010-10-04 NOTE — Telephone Encounter (Signed)
LMOM for call back. 

## 2010-10-04 NOTE — Telephone Encounter (Signed)
Pt wants to know if you got the records from her family doctor.

## 2010-10-05 NOTE — Telephone Encounter (Signed)
Called and spoke with patient and let her know that Annice Pih is off today and I'm not sure if we have the records.  She says she has a copy and would like to go ahead and reschedule her appointment with Dr Tenny Craw.  I told her I would let Annice Pih know and forward this to her scheduler also.

## 2010-10-05 NOTE — Telephone Encounter (Signed)
Pt rtn call to jackie from yesterday pls contact her at [phone]

## 2010-10-11 NOTE — Assessment & Plan Note (Signed)
[facility]                       [place] [facility] OFFICE NOTE   NAME:Allison Sharp, Allison Sharp                         MRN:          [id number]  DATE:07/17/2007                            DOB:          1956-02-26    HISTORY:  Ms. Allison Sharp is a pleasant 55 year old patient of Dr. Samuel Jester.  She had previously been seen by Dr. Debby Bud.  I actually saw her  back in 1995 for atypical chest pain.  The patient has been having  continued atypical chest pain, dyspnea, and mild lower extremity edema.   The patient also has been to the [facility] ER a couple of times.  She  was concerned about her heart, and also about possibly having a stroke.  She describes in stressful situations having squeezing in her chest and  then a drawing of her mouth.  It is hard for her to explain exactly what  she means but it sounds like her muscles around the perioral area  tighten up.  Since I last saw her, she has been diagnosed as  schizophrenia and bipolar.  She is on Abilify and Depakote.  I suspect  that part of this mouth drawing, has to do with dystonia from the  Abilify.  I told her that she should talk to her psychiatrist or Dr.  Charm Barges about this.  She has never had any evidence of previous seizures,  TIA, or CVAs.   In regards to her chest pain, it is atypical and is not necessarily  exertional.  She may actually have a form of panic attacks as well.  She  gets occasional shortness of breath, but no diaphoresis, no  palpitations.  She has chronic mild lower extremity edema with no  history of DVT.  She does not watch her diet very well, and she has been  fairly overweight her whole life.   Since I last saw her, she has gained about 50 pounds.  ROS otherwise  negative   Looking through the patient's old records she has had a normal cath in  [place] back in early 2000.  She has had normal nuclear studies in  our office on 3 occasions, most recently in January 2004.   She has  previously been seen by Dr. __________  and Dr. Dietrich Pates.   CORONARY RISK FACTORS INCLUDE:  Hypertension and being postmenopausal.   PAST MEDICAL HISTORY REMARKABLE FOR:  1. Hyperthyroidism with previous radioactive iodine therapy.  2. Hypercholesterolemia.  3. Previous cholecystectomy.  4. Previous hysterectomy.  5. Bilateral knee arthroscopies with followup right knee surgery.  6. Recent diagnosis in the past year of schizophrenia and bipolar      disease.  7. Reflux.   FAMILY HISTORY:  Remarkable for mother having hypertension.  No  premature coronary artery disease.  Patient is divorced.  She has two  grown sons.  She is currently living with her niece.  She is sedentary.  She does drive.  She does not smoke or drink.  The only thing that seems  to give her some satisfaction is playing with and caring for her  2-year-  old niece.   MEDICATIONS INCLUDE:  1. Abilify he 30 mg a day.  2. Levoxyl 112 mcg a day.  3. Imdur 60 a day.  4. Aricept 10 a day.  5. Depakote 500 nightly.  6. Detrol 4 mg a day.  7. Celebrex 200 b.i.d.  8. Hydroxyzine 25 t.i.d.  9. Zoloft 200 a day.  10.Verapamil 180 a day.  11.Premarin 0.9 a day.  12.Atacand 4/620 b.i.d.   NKDA   PHYSICAL EXAMINATION:  Remarkable for an overweight white female in no  distress.  Weight is 345, blood pressure 110/80, pulse 68 and regular,  respiratory rate is 16, afebrile.  HEENT:  Unremarkable.  Carotids are without bruit.  No lymphadenopathy, thyromegaly or JVP  elevation.  LUNGS:  Clear with good diaphragmatic motion.  No wheezing.  S1-S2 with distant heart sounds.  PMI not palpable.  ABDOMEN:  Protuberant.  Bowel sounds positive.  No hepatosplenomegaly or  hepatojugular reflux.  Status post hysterectomy and cholecystectomy.  No  bruits.  No AAA.  Distal pulses are intact with trace edema.  NEUROLOGIC:  Nonfocal.  SKIN:  Warm and dry.  No muscular weakness.  No evidence of tardive  dyskinesia.   Her  baseline EKG is normal.   IMPRESSION:  1. Atypical chest pain.  Three previous normal Myoviews and a normal      cath.  No need for further workup.  Continue baby aspirin a day.  2. Dyspnea related to being overweight.  No evidence of significant      cardiopulmonary disease, previous echoes with normal LV function,      and no murmurs on exam.  Continue a low salt diet.  3. Hypertension currently well-controlled.  Continue verapamil and      Atacand.  4. Drawing of the mouth, question of dystonia secondary to Abilify.      Follow up with psychiatry for this, and her bipolar disease.  5. Hypothyroidism.  Status post radioactive iodine treatment, TSH, and      T4 per Dr. Charm Barges.  Continue Levoxyl replacement.  6. A spastic bladder.  Continue Detrol 4 mg a day.  7. Arthritis.  Continue Celebrex p.r.n. the patient does not have any      known coronary disease and has good LV function.  I do not see a      contraindication to it if it works better than any other      nonsteroidal.  8. Postmenopausal.  Premarin 0.9 mg a day at the discretion of her      primary care doctor.  9. History of reflux.  Weight loss encouraged, to continue AcipHex 20      b.i.d.   We will see her on an as-needed basis.     Noralyn Pick. Eden Emms, MD, [facility]  Electronically Signed    PCN/MedQ  DD: 07/17/2007  DT: 07/18/2007  Job #: [id number]   cc:   Samuel Jester

## 2010-10-11 NOTE — Group Therapy Note (Signed)
Allison Sharp, Allison Sharp                  ACCOUNT NO.:  [id number]   MEDICAL RECORD NO.:  [id number]          PATIENT TYPE:  EMS   LOCATION:  ED                            FACILITY:  [facility]   PHYSICIAN:  Syed T. Arfeen, M.D.   DATE OF BIRTH:  1955/08/20                                 PROGRESS NOTE   The patient came in today with her niece. She was last seen in February  2009.  The patient reported that she moved to [place] to live  close to her granddaughter. She moved in with her girlfriend however.  She endorsed having agitation, mood swing, and tired living with her and  decided to move back to [place].  She reported that her medicine  was changed in [place] and her psychiatrist stopped Abilify and  tried her on Seroquel, as he felt that the Abilify was not working.  However, the patient continued to endorse mood lability and paranoia.  She reported being easily irritable, agitated, and angry.  She is also  concerned about her memory. She is taking Aricept 10 mg for the past 4  years.  However, she has no neurology workup done.  Her niece reported  that the patient still gets paranoid, especially at the nighttime and  feels that somebody is going to break into her house.  She brought all her medication bottles with her.  She is now seeing a  doctor at [facility].  She is taking Klonopin 1 mg  b.i.d., Lasix 40 mg in the morning, Levoxyl 125 mcg 1 daily, verapamil  ER 180 mg 1 daily, gabapentin 300 mg 1 t.i.d. Depakote ER 500 mg 1  b.i.d., aspirin 81 mg daily, simvastatin 40 mg 1 daily, Zoloft 100 mg 2  tablets a day, Aricept 10 mg 1 daily, Niaspan 500 mg 1 at bedtime,  Flexeril 10 mg 1 b.i.d., as needed and Prevacid daily. She reported no  side effects of medication but feels sometimes that she has difficulty  remembering things.   MENTAL STATUS EXAM:  The patient is obese, casually dressed. Her speech is normal rate and  rhythm.  Her thought  process is logical and goal-directed.  There is  some distraction in concentration and attention.  She denies any  auditory hallucinations, suicidal thoughts or homicidal thoughts.  There  is some paranoia but no delusion or obsession noted.  Her registration  is okay, but she has difficulty in recall. She is able to do serial 7's  with some difficulty. Her insight, judgment and impulse control are  okay.   ASSESSMENT:  Bipolar disorder, depressed. 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   PLAN:  I talked to her in detail getting a neurology consult since she has no  neurology workup in the past few years, though she is taking Aricept but  there were no causes or rule out for dementia.  She is scheduled to see  her primary care doctor next week, and I recommended to have a neurology  workup. I also recommended to have Depakote level since  she has no  Depakote level in past 1 year.  We may consider increasing the Depakote  depending on the Depakote level to target her mood lability.  The  patient would like to see me in [place] office as she lives in  [place].  We will schedule to see her in 4 weeks until her lab work and  neurology workup are done.     Syed T. Lolly Mustache, M.D.  Electronically Signed    STA/MEDQ  D:  01/25/2009  T:  01/25/2009  Job:  [id number]

## 2010-10-14 ENCOUNTER — Telehealth: Payer: Self-pay | Admitting: *Deleted

## 2010-10-14 NOTE — Consult Note (Signed)
NAME:  Allison Sharp, Allison Sharp                            ACCOUNT NO.:  [id number]   MEDICAL RECORD NO.:  [id number]                   PATIENT TYPE:  INP   LOCATION:  NA                                   FACILITY:  [facility]   PHYSICIAN:  R. Roetta Sessions, M.D.              DATE OF BIRTH:  Jun 20, 1955   DATE OF CONSULTATION:  10/02/2002  DATE OF DISCHARGE:                                   CONSULTATION   REQUESTING PHYSICIAN:  Colon Flattery, MD   REASON FOR CONSULTATION:  Chronic constipation and rectal bleeding.   HISTORY OF PRESENT ILLNESS:  Allison Sharp is a 55 year old to the Caucasian female  patient of Dr. Dewaine Conger who presents today for evaluation of chronic  constipation and rectal bleeding.  Allison Sharp says that Allison Sharp has had constipation  for most of her life.  Recently it has been more problematic.  Allison Sharp can go  anywhere from 1-2 weeks without a bowel movement; however, Allison Sharp does pass a  small amount of stool a few times weekly.  Allison Sharp often has the urge to have a  bowel movement, but cannot seem to get the stool out. Allison Sharp has to strain a  lot.  This is when Allison Sharp sees bright red blood noted in the toilet bowl.  At  times Allison Sharp has to digitally disimpact herself. Allison Sharp has been hospitalized in  the 1990s for this as well.  In the past Allison Sharp has used magnesium citrate,  which has helped, but it seems to have lost this effect.  Allison Sharp has also used  lactulose in the past which has helped.  Allison Sharp complains of abdominal swelling  and cramps.  When Allison Sharp has constipation, Allison Sharp gets nausea and occasionally  vomiting. Allison Sharp denies any heartburn.   CURRENT MEDICATIONS:  1. Altace 10 mg daily.  2. Trazodone 150 mg daily.  3. Vioxx 25 mg daily.  4. Zocor 80 mg daily.  5. Vivelle 0.1 patch every Thursday and Sunday.  6. Celexa 20 mg daily.  7. Isosorbide 25 mg daily.  8. Prilosec 20 mg daily.  9. Xanax 0.5 mg p.r.n.  10.      Vicodin p.r.n.  11.      Stool softener 2 q.h.s.  12.      Levoxyl daily.   ALLERGIES:  No known drug  allergies.   PAST MEDICAL HISTORY:  History of hyperthyroidism, status post radioactive  therapy, currently on supplementation. Allison Sharp says that Allison Sharp recently had her  TSH checked with was normal.  Hypercholesterolemia, history of coronary  artery disease, status post 2 MIs, last one in the year 2000.  Allison Sharp has had 5  cardiac catheterizations previously.  Allison Sharp is followed by Dr. Dietrich Pates. Allison Sharp  was hospitalized in January 2004 for chest pain, but Allison Sharp tells me that Allison Sharp  did not have a cardiac catheterization or MI at that time.  Allison Sharp also has  hypertension, gastroesophageal reflux  disease, depression and anxiety,  bilateral osteoarthritis of the knees.   PAST SURGICAL HISTORY:  Cholecystectomy, total hysterectomy, bilateral knee  arthroscopy as well as right knee surgery for realignment.   FAMILY HISTORY:  Mother has hypertension.  Brother is deceased, had  hypertension, hypercholesterolemia, and heart disease.  No family history of  colorectal cancer or chronic GI illnesses.   SOCIAL HISTORY:  Allison Sharp is divorced. Allison Sharp has 2 grown sons. Allison Sharp is disabled.  Allison Sharp has never been a smoker, denies any alcohol use.   REVIEW OF SYSTEMS:  Please see HPI for GI.  GENERAL:  Denies any weight  loss.  CARDIOPULMONARY:  History of chest pain as outlined above. Currently  not experiencing any chest pain or shortness of breath.   PHYSICAL EXAMINATION:  VITAL SIGNS:  Weight 311. Height 5 feet 9 inches.  Temperature 99, blood pressure 120/78, pulse 80.  GENERAL:  A pleasant, morbidly obese, Caucasian female in no acute distress.  SKIN:  Warm and dry.  No jaundice.  HEENT:  Conjunctivae are pink.  Sclerae anicteric. Oropharyngeal mucosa  moist and pink.  No lesions, erythema or exudate.  NECK:  No lymphadenopathy or thyromegaly.  CHEST:  Lungs are clear to auscultation.  CARDIOVASCULAR:  Cardiac exam reveals regular rate and rhythm.  Normal S1,  S2.  No murmurs, rubs, or gallops.  ABDOMEN:  Obese, but symmetrical,  soft, nontender.  No organomegaly or  masses.  RECTAL:  Examination reveals external hemorrhoid, nonthrombosed,  nonbleeding.  No masses in the rectal vault. No stool in the rectal vault.  Secretions are Hemoccult negative.  EXTREMITIES:  No edema.   IMPRESSION:  Allison Sharp has chronic constipation which has been progressively  worsening.  Allison Sharp is having bright red blood per rectum on occasions as well.  It has been many years since her last colonoscopy (done at [facility]).  At this  time, I feel, that we need to proceed with colonoscopy.   PLAN:  1. Colonoscopy in the near future.  The patient refused GoLYTELY prep.  Allison Sharp     has been unable to tolerate this in the past.  We will try QWIK prep,     although Allison Sharp is aware that the results may not be as complete.  2.     Fiber Choice 2 tablets daily.  3. MiraLax 17 gm p.o. b.i.d. for 3 days then daily #527 gm 1 refill given.   I would like to thank Dr. Dewaine Conger for allowing [name] to take part in the care of  this patient.     Tana Coast, Pricilla Larsson, M.D.    LL/MEDQ  D:  10/02/2002  T:  10/03/2002  Job:  [id number]   cc:   Colon Flattery, MD  [address]  [place]  [place] [address]  Fax: [phone]

## 2010-10-14 NOTE — Consult Note (Signed)
NAME:  Sharp, [name]                            ACCOUNT NO.:  [id number]   MEDICAL RECORD NO.:  [id number]                   PATIENT TYPE:  INP   LOCATION:  IC10                                 FACILITY:  [facility]   PHYSICIAN:  E. Graceann Congress, M.D. [facility]         DATE OF BIRTH:  06/01/1955   DATE OF CONSULTATION:  DATE OF DISCHARGE:                                   CONSULTATION   HISTORY OF PRESENT ILLNESS:  The patient is a very nice, obese 55 year old  white female with prolonged chest pain with left arm heaviness lasting  several hours.  It started as left precordial and left shoulder pain, then  became heavier in the chest region and with the left arm feeling quite  heavy.  She had some relief with nitroglycerin spray.  She has a history of  recurrent chest pain in the past and has apparently had four  catheterizations at [facility] with a finding of only minor coronary  artery disease.  The most recent study 12/01 revealed a 25% proximal LAD,  30% mid-distal LAD, normal RCA and circumflex, and normal LV with an EF of  74%.   At that time the patient was in the REVERSAL trial.  She had questionable MI  in 1997 and 1998.   The patient has had no recent exertional chest discomfort and had been pain-  free for a year or so prior to this episode.   ALLERGIES:  None.   PAST MEDICAL HISTORY:  The patient has hypertension, hyperlipidemia, history  of depression, GERD, and arthritis.  She also has had severe back problems,  for which she is disabled totally.   MEDICATIONS:  Medications at home include:   1. Premarin 1.25 daily.  2. TriCor 160 mg.  3. Altace 10 mg.  4. Toprol XL questionably 100 mg.  5. Prilosec 40 mg.  6. Celexa 20 mg h.s.  7. Levoxyl 100 mcg.  8. Trazodone 100 mg.  9. Vioxx.  10.      Zocor 20 mg.  11.      Clarinex.   Since in the hospital, she is on:  1. Aspirin 325 mg.  2. Lopressor 25 mg b.i.d.  3. Isordil 20 mg b.i.d.  4. Zocor 20 mg.  5.  Synthroid 100 mcg daily.  6. Premarin 1.25 mg daily.  7. Protonix 80 mg.  8. Trazodone 100 mg h.s.  9. Celexa 20 mg.  10.      Altace 5 mg.  11.      Lovenox.   SOCIAL HISTORY:  The patient lives at [place], two children.  She lives with  her niece.   FAMILY HISTORY:  Father died at 54 of heart disease.  Mother died at 69 of  cancer.   REVIEW OF SYSTEMS:  HEENT:  Head, eyes, ears, nose, and throat unremarkable  except for some nasal problems.  CARDIOVASCULAR:  As  noted above.  GENITOURINARY:  Frequency.  MUSCULOSKELETAL:  Arthralgias, ankle and back  pain.  CARDIOPULMONARY:  As noted in the present illness.  GASTROINTESTINAL:  Nausea and vomiting.  Otherwise negative.   PHYSICAL EXAMINATION:  VITAL SIGNS:  Blood pressure 116/76, pulse 79, normal  sinus rhythm, respirations 20.  GENERAL:  The patient is obese, in no distress at the present time, feeling  much better than on admission.  NECK:  JVP is not elevated.  Carotid pulses are palpably equal without  bruits.  HEENT:  Unremarkable.  CHEST:  The lungs are clear.  CARDIAC:  Normal with no murmur.  ABDOMEN:  Obesity.  EXTREMITIES:  No edema.  NEUROLOGIC:  No obvious abnormality.   LABORATORY DATA:  EKG within normal limits.  Enzymes have been negative.  Hemoglobin 13.7, hematocrit 40, platelets 259,000, and white count 7300.  Sodium 136, potassium 3.8, chloride 103, CO2 42, BUN 21, creatinine 1.0, and  glucose 120.  Calcium 9.5.   IMPRESSION:  1. Prolonged chest pain, normal EKG, normal enzymes, with history of     nonobstructive coronary artery disease with most recent catheterization     two years ago.  2. Hypertension, well-controlled.  3. Hyperlipidemia, on Zocor 20 mg.  4. Depression.  5. History of hypothyroidism, on Synthroid.    RECOMMENDATIONS:  Because of the previous catheterizations revealing  nonobstructive disease and the somewhat atypical nature of her symptoms, I  have suggested an adenosine  Cardiolite.  If this is abnormal, we would plan  to catheterize her.   Thank you for the opportunity to participate in this nice patient's care.                                                  Cecil Cranker, M.D. [facility]    EJL/MEDQ  D:  06/10/2002  T:  06/10/2002  Job:  [id number]   cc:   Christella Noa, M.D.  [address]  [place], [place] [address]  Fax: [phone]   Gracelyn Nurse, M.D.  [address] [place]  [place] [address]  Fax: [phone]

## 2010-10-14 NOTE — H&P (Signed)
Allison Sharp, Allison Sharp                            ACCOUNT NO.:  [id number]   MEDICAL RECORD NO.:  [id number]                   PATIENT TYPE:  EMS   LOCATION:  ED                                   FACILITY:  [facility]   PHYSICIAN:  Sarita Bottom, M.D.                  DATE OF BIRTH:  Feb 15, 1956   DATE OF ADMISSION:  06/09/2002  DATE OF DISCHARGE:                                HISTORY & PHYSICAL   PRIMARY CARE PHYSICIAN:  Dr. Excell Seltzer, [place], [place].   CHIEF COMPLAINT:  I have chest pain.   HISTORY OF PRESENT ILLNESS:  Ms. Stephaine Sharp is a 55 year old lady with a  history of hypertension, questionable history of myocardial infarction two  times.  She was apparently well until this morning when she woke up with  numbness in her left arm which subsequently progressed to retrosternal chest  pain.  She describes the chest pain as intensive kind of pain, 10/10  initially, but has since gone down to 1/10 after she was given nitroglycerin  in the emergency room.  The pain occurred when she was asleep.  She admits  to nausea and vomiting which was associated with the pain.  She also had  palpitations early on, since resolved, and she also had dizziness.  She said  the pain radiates to her back.  She was treated with IV nitroglycerin in the  ER, and there was an attempt to transfer to [facility] after they spoke to  Dr. Chales Abrahams, but there were no available beds, so the decision was made to  admit the patient to [facility] until a bed becomes available.   REVIEW OF SYSTEMS:  She denies any fever or weight loss.  RESPIRATORY:  Denies any cough or shortness of breath.  Denies any diarrhea.  All other  systems reviewed and were negative.   PAST MEDICAL HISTORY:  1. Hypertension.  2. Depression.  3. Hyperthyroidism.  4. Gastroesophageal reflux disease.  5. Osteoarthritis.  6. Status post four cardiac catheterizations.  The last one was in 1999.     She is not too sure about the results, but they did not  find any     significant blockages, and they did not put in any stents, and they did     not do any recurrent bypass surgeries.  7. Hyperlipidemia.   MEDICATIONS:  1. Premarin 1.25.  2. Tricor 160 mg  3. Altace 10 mg  4. Toprol XL 20 mg.  5. Prilosec 40 mg.  6. Celexa 20 mg.  7. Levoxyl 100 mcg.  8. Trazodone 100 mg  9. Vioxx 20 mg  10.      Zocor 20 mg   ALLERGIES:  No known drug allergies.   FAMILY HISTORY:  Mother died from heart disease at the age of 46.  Father  died from cancer at the age of 13.  SOCIAL HISTORY:  She is divorced, and she is on disability.  She has two  children.  She does not smoke and does not drink alcohol.   PHYSICAL EXAMINATION:  VITAL SIGNS:  Blood pressure is 108/68 with a heart  rate of 80, temperature 97.9.  GENERAL:  A middle-aged lady who is obese, lying comfortably on a stretcher.  She is not in any distress.  HEENT:  She is anicteric.  Pupils equal, round and reactive to light and  accommodation.  NECK:  Supple.  There is no lymphadenopathy.  There is no thyromegaly.  CHEST:  Clear to auscultation.  CARDIOVASCULAR:  Heart sounds 1 and 2 are normal.  She has a regular rhythm  and rate.  No murmurs were heard.  The PMI was not localized.  ABDOMEN:  Soft.  Surgical scar from surgery for a hiatal hernia.  No masses  or organomegaly were felt.  Bowel sounds were present.  CNS:  She is alert and oriented x3.  She has no gross or focal deficits.  EXTREMITIES:  She has no edema.   LABORATORY DATA:  Sodium of 136, potassium 8.8, CO2 42, chloride 21,  creatinine 1.0, glucose of 120, calcium 9.5, troponin 0.01.  CK is 174.  MB  fraction is 2.3.  WBCs 7.3, hemoglobin 13.7, MCV 88.4, platelet count of  259.  Chest x-ray does not show any cardiomegaly.  There are no acute  infiltrates.  There is a questionable widening of the mediastinum.   ASSESSMENT AND PLAN:  1. Acute coronary syndrome.  The patient will be admitted to intensive care     at  this time.  Will discontinue the IV heparin and start her on Lovenox 1     mg/kg q.12h., and start her on aspirin and metoprolol 25 mg b.i.d.     Continue with the Altace at 5 mg daily.  Also begin Integrilin, and give     morphine for pain and Isordil and nitroglycerin p.r.n. orally.  She will     be transferred to [facility] once a bed becomes available, under the care     of Dr. Chales Abrahams.  2. Chest pain with radiation to the back, and a questionable widening of the     mediastinum.  Rule out any aortic dissection with a CT of the chest.  3. Hypertension.  The patient's blood pressure is monitored in the ICU, and     will continue the Altace at 5 mg daily.  4. Hyperlipidemia.  Will continue the Zocor 20 mg daily.  5. Hyperthyroidism.  The patient will be resumed on Levoxyl 100 mcg daily,     and will check a TSH level.  6. Gastroesophageal reflux disease.  Will continue the Prilosec 40 mg daily.  7. We will continue the patient's preadmission medications which include     Premarin, Celexa, and trazodone.  8. The patient will be admitted under the care of the Hospitalist group, and     she will be transferred to [facility] once a bed becomes     available.   The above plan has been discussed with the patient and the patient's sister,  and they both seem agreeable to the plan.  Further work up and management  may be depend on clinical course.   Course of care is more than 45 minutes.  Sarita Bottom, M.D.    DW/MEDQ  D:  06/09/2002  T:  06/09/2002  Job:  [id number]   cc:   Dr. Excell Seltzer at [place](?)

## 2010-10-14 NOTE — Op Note (Signed)
NAME:  Allison Sharp, Allison Sharp                            ACCOUNT NO.:  [id number]   MEDICAL RECORD NO.:  [id number]                   PATIENT TYPE:  AMB   LOCATION:  DAY                                  FACILITY:  [facility]   PHYSICIAN:  Lionel December, M.D.                 DATE OF BIRTH:  1955-07-24   DATE OF PROCEDURE:  10/17/2002  DATE OF DISCHARGE:                                 OPERATIVE REPORT   PROCEDURE:  Total colonoscopy.   INDICATIONS FOR PROCEDURE:  [name] is a 55 year old Caucasian female with  multiple medical problems who has been having rectal bleeding. She also has  constipation. She was seen in the office recently and did not really have  any symptoms of upper GI tract. She now comes in with nausea, vomiting that  started after she took magnesium citrate. Her prep was satisfactory. Her  nausea and vomiting is felt to be related to magnesium citrate, for which  she was given Phenergan with some benefit. The procedure is reviewed with  the patient and informed consent was obtained.   PREOP MEDICATIONS:  Demerol 25 mg  IV, Versed 6 mg IV in divided dose.   INSTRUMENT:  Olympus video system.   FINDINGS:  Procedure performed in endoscopy suite. The patient's vital signs  and O2 saturations were monitored during the procedure and remained stable.  The patient was placed in the left lateral decubitus position,  rectal examination performed. No abnormality noted on external or digital  exam. The scope was placed in the rectum and advanced under direct vision  into the sigmoid colon and beyond. Preparation was felt to be excellent. The  scope was passed through the cecum which was identified by the appendiceal  orifice and the ileocecal valve. Pictures taken for the record. As the scope  was withdrawn, the mucosa was carefully examined and was normal throughout.  The mucosa of the proximal rectum was normal but distally there was  granularity and erythema and loss of vascularity but  there were no erosions  or ulcers. Pictures taken followed by biopsy. The scope was retroflexed to  examine the anorectal junction and she had hemorrhoids below the dentate  line that were prominent. The endoscope was straightened and withdrawn. The  patient tolerated the procedure well.   FINAL DIAGNOSES:  Distal proctitis, small external hemorrhoids, otherwise  normal colonoscopy. Suspect rectal bleeding secondary to hemorrhoids.  Proctitis felt to be nonspecific.   RECOMMENDATIONS:  1. She will continue FiberChoice and MiraLax as before.  2. Rowasa suppository one per rectum at bedtime daily for two weeks.   I will be contacting patient with biopsy results. I will ask her to keep a  written record as to frequency of these bleeding episodes.  Lionel December, M.D.    NR/MEDQ  D:  10/17/2002  T:  10/17/2002  Job:  [id number]   cc:   Colon Flattery, MD  [address]  [place]  [place] [address]  Fax: [phone]

## 2010-10-14 NOTE — Procedures (Signed)
   NAME:  Allison Sharp, Allison Sharp                            ACCOUNT NO.:  [id number]   MEDICAL RECORD NO.:  [id number]                   PATIENT TYPE:  INP   LOCATION:                                       FACILITY:   PHYSICIAN:  Cecil Cranker, M.D. [facility]         DATE OF BIRTH:  09/06/1955   DATE OF PROCEDURE:  06/11/2002  DATE OF DISCHARGE:                                    STRESS TEST   INDICATION:  The patient has a history of nonobstructive coronary artery  disease by catheterization in 2001.  She presented with chest discomfort.  She has had negative cardiac enzymes x3.  She has not had any ischemia noted  on EKG.   RESTING DATA:  EKG:  Sinus rhythm at 91 beats per minute with nonspecific ST  changes, normal axis.  Resting blood pressure 138/76.   Adenosine was infused over 4 minutes.  Cardiolite was injected at 3 minutes.  The patient experienced substernal chest burning.  This resolved quickly in  the recovery.   STRESS DATA:  EKG with no arrhythmias and no ischemic changes.   Final images and results are pending M.D. review.     [name] Mercy Riding, P.A. [facility]                     E. Graceann Congress, M.D. [facility]    AB/MEDQ  D:  06/11/2002  T:  06/11/2002  Job:  [id number]

## 2010-10-14 NOTE — Assessment & Plan Note (Signed)
REcords not received from [facility] (stress test).  Appointment cancelled as nothing new to discuss (without results).

## 2010-10-14 NOTE — Discharge Summary (Signed)
Allison Sharp, Allison Sharp                            ACCOUNT NO.:  [id number]   MEDICAL RECORD NO.:  [id number]                   PATIENT TYPE:  INP   LOCATION:  A219                                 FACILITY:  [facility]   PHYSICIAN:  Gracelyn Nurse, M.D.              DATE OF BIRTH:  1956-05-29   DATE OF ADMISSION:  06/09/2002  DATE OF DISCHARGE:  06/11/2002                                 DISCHARGE SUMMARY   DISCHARGE DIAGNOSES:  1. Chest pain.  2. Nonobstructive coronary artery disease:  Heart catheterization in     December 2001 at [facility] showed 25% proximal left     anterior descending coronary artery, a 30% mid distal left anterior     descending coronary artery, and a normal right coronary artery, and an     ejection fraction of 74%.  3. Right arm numbness.  4. History of lumbar disk disease.  5. Hypertension.  6. Depression.  7. Hypothyroidism.  8. Gastroesophageal reflux disease.  9. Osteoarthritis.  10.      Hyperlipidemia.   DISCHARGE MEDICATIONS:  1. Imdur 30 mg daily.  2. Vicodin one q.4h. p.r.n.  3. Tricor 160 mg daily.  4. Altace 10 mg daily.  5. Toprol-XL 100 mg daily.  6. Prilosec 40 mg daily.  7. Celexa 20 mg daily.  8. Levoxyl 100 mcg daily.  9. Trazodone 100 mg q.h.s.  10.      Vioxx 25 mg daily.  11.      Zocor 20 mg daily.   PROCEDURES:  Cardiolite which showed EF of 59% and no ischemia.   REASON FOR ADMISSION:  This is a 55 year old white female who has a history  of nonobstructive coronary artery disease.  She awoke with numbness in her  left arm which progressed to substernal chest pain.  She describes the pain  as 10/10 initially but since has gone down significantly.  She was given  nitroglycerin in the emergency room which did relieve this.  She did say she  had some nausea and vomiting which she did associated with the pain.   HOSPITAL COURSE:  1. Chest pain.  The patient was admitted, started on nitroglycerin, beta  blocker, aspirin, and also given Lovenox and Integrilin.  She has a     history of nonobstructive coronary artery disease which was determined on     obtaining records from past heart catheterization at [facility].  She was seen     the next morning by [facility] who stopped her Integrilin,     scheduled a Cardiolite stress test which revealed the above results.  It     was thought that the chest pain could be coming from coronary vasospasm     so she was prescribed long-acting nitrates and will follow up at [facility]     [facility].  2. Nonobstructive coronary artery disease.  She remained  on her current     medications.  We are adding the long-acting nitrates.  Her stress test     was normal so the decision was made not to recatheterize her.  Again, she     will follow up at [facility].  3. Left arm numbness.  She has no focal neurologic deficits on exam, just     some mild numbness that seems to be more in a dermatomal pattern on that     side.  She has a history of lumbar disk disease and I suspect she could     have cervical disk disease.  She has followed up with Dr. Otelia Sergeant in     orthopedics in [place] in the past and I recommended that she see him     for further workup on this.  4. Hypertension.  She was treated with her usual medications.  5. All other medical problems were stable during hospital stay.   DISPOSITION:  The patient is discharged in stable condition.  She will  follow up with [facility] on July 10, 2002.                                               Gracelyn Nurse, M.D.    JDJ/MEDQ  D:  06/11/2002  T:  06/12/2002  Job:  [id number]

## 2010-10-14 NOTE — Progress Notes (Signed)
Did not receive records from [facility] regarding stress test. Will not charge for encounter as clinic visit not done.

## 2010-10-14 NOTE — Telephone Encounter (Signed)
Called patient's sister to advise that Dr.Ross did not receive records from [facility]. She states that she will bring a copy to her appointment with Dr.Ross on 11/04/2010.

## 2010-11-01 ENCOUNTER — Encounter: Payer: Self-pay | Admitting: Physician Assistant

## 2010-11-04 ENCOUNTER — Ambulatory Visit: Payer: Medicare Other | Admitting: Internal Medicine

## 2010-11-04 ENCOUNTER — Encounter: Payer: Self-pay | Admitting: Internal Medicine

## 2010-11-04 ENCOUNTER — Ambulatory Visit (INDEPENDENT_AMBULATORY_CARE_PROVIDER_SITE_OTHER): Payer: Medicare Other | Admitting: Internal Medicine

## 2010-11-04 DIAGNOSIS — I2510 Atherosclerotic heart disease of native coronary artery without angina pectoris: Secondary | ICD-10-CM

## 2010-11-04 DIAGNOSIS — I119 Hypertensive heart disease without heart failure: Secondary | ICD-10-CM

## 2010-11-04 DIAGNOSIS — R0602 Shortness of breath: Secondary | ICD-10-CM

## 2010-11-04 DIAGNOSIS — E7800 Pure hypercholesterolemia, unspecified: Secondary | ICD-10-CM

## 2010-11-04 DIAGNOSIS — R0989 Other specified symptoms and signs involving the circulatory and respiratory systems: Secondary | ICD-10-CM

## 2010-11-04 DIAGNOSIS — R0609 Other forms of dyspnea: Secondary | ICD-10-CM

## 2010-11-04 LAB — BASIC METABOLIC PANEL
BUN: 11 mg/dL (ref 6–23)
Chloride: 105 mEq/L (ref 96–112)
Potassium: 4.4 mEq/L (ref 3.5–5.1)
Sodium: 141 mEq/L (ref 135–145)

## 2010-11-04 LAB — BRAIN NATRIURETIC PEPTIDE: Pro B Natriuretic peptide (BNP): 15 pg/mL (ref 0.0–100.0)

## 2010-11-04 MED ORDER — FUROSEMIDE 20 MG PO TABS: ORAL_TABLET | ORAL | Status: DC

## 2010-11-04 NOTE — Patient Instructions (Addendum)
Your physician wants you to follow-up in: November 2012 with Dr. Tenny Craw.  You will receive a reminder letter in the mail two months in advance. If you don't receive a letter, please call our office to schedule the follow-up appointment. You had lab work done today. Take furosemide 20 mg alternating with 40 mg by mouth every other day.

## 2010-11-07 ENCOUNTER — Other Ambulatory Visit: Payer: Self-pay | Admitting: Obstetrics and Gynecology

## 2010-11-07 DIAGNOSIS — Z1231 Encounter for screening mammogram for malignant neoplasm of breast: Secondary | ICD-10-CM

## 2010-11-07 NOTE — Progress Notes (Signed)
HPI Patient is a 55 year old who I saw earlier this spring .  She was referred for evaluation of SOB and chest pain.  Chest pain was atypical. I recomm a myoview to evaluate.  Given her size it could not be done here   She instead had her stress test at [facility] in [place]. It showed normal perfusion. I had also just received the records from [facility]  Last cardiac cath at [facility] in 2001 showed only a 30% lesion in the LAD.  In the interval she says her breathing is improved.  Her weight is down.  She denies chest pains..  No Known Allergies  Current Outpatient Prescriptions  Medication Sig Dispense Refill  . ARIPiprazole (ABILIFY) 10 MG tablet Take 10 mg by mouth daily.        Marland Kitchen aspirin 81 MG EC tablet Take 81 mg by mouth daily.        Marland Kitchen atorvastatin (LIPITOR) 40 MG tablet Take 40 mg by mouth daily.        Marland Kitchen desvenlafaxine (PRISTIQ) 50 MG 24 hr tablet Take 50 mg by mouth daily.        . diazepam (VALIUM) 5 MG tablet Take 5 mg by mouth daily.        . divalproex (DEPAKOTE) 500 MG EC tablet Take 500 mg by mouth 2 (two) times daily.       . ergocalciferol (VITAMIN D2) 50000 UNITS capsule Take 50,000 Units by mouth once a week.        . furosemide (LASIX) 20 MG tablet Take 20 mg alternating with 40 mg every other day.  45 tablet  11  . gabapentin (NEURONTIN) 300 MG capsule Take 1 tab in the AM, 2 tabs at noon, 2 tabs bedtime      . levothyroxine (SYNTHROID, LEVOTHROID) 125 MCG tablet Take 125 mcg by mouth daily.        Marland Kitchen lubiprostone (AMITIZA) 24 MCG capsule Take 24 mcg by mouth 2 (two) times daily with a meal.        . methocarbamol (ROBAXIN) 750 MG tablet Take 750 mg by mouth 2 (two) times daily.        . Multiple Vitamin (MULTIVITAMIN) tablet Take 1 tablet by mouth daily.        . niacin (NIASPAN) 500 MG CR tablet Take 2,000 mg by mouth at bedtime.       Marland Kitchen omeprazole (PRILOSEC) 20 MG capsule Take 20 mg by mouth 2 (two) times daily.       . verapamil (COVERA HS) 180 MG (CO) 24 hr tablet Take 180 mg  by mouth at bedtime.        Marland Kitchen HYDROcodone-acetaminophen (LORTAB) 7.5-500 MG per tablet Take 1 tablet by mouth as needed.      Marland Kitchen ibuprofen (ADVIL,MOTRIN) 800 MG tablet Take 1 tablet by mouth daily.      . mupirocin (BACTROBAN) 2 % ointment Apply 1 application topically as needed.      Marland Kitchen oxyCODONE-acetaminophen (PERCOCET) 5-325 MG per tablet Take 1 tablet by mouth as needed.      . promethazine (PHENERGAN) 25 MG tablet Take 1 tablet by mouth as needed.        Past Medical History  Diagnosis Date  . Hyperlipidemia   . CAD (coronary artery disease)   . Bipolar 1 disorder   . Schizophrenia   . GERD (gastroesophageal reflux disease)   . Hyperthyroidism   . Chronic back pain   . Morbid obesity   .  Dyspnea     PFT 03/05/09 FEV1 2.77(98%), FVC 3.25(86%), FEV1% 85, TLC 5.88(99%), DLCO 60% ,  Methacholine challenge 03/16/09 normal ,  CT chest 03/12/09 no pulmonary disease  . Anxiety   . Arthritis   . Depression   . OSA on CPAP     2 liters  . HTN (hypertension)   . History of colonoscopy 10/17/2002    by Dr Rehman-> distal non-specific proctitis, small ext hemorrhoids,   . Fungal infection   . Cellulitis   . Contusion of sacrum   . Migraine headache   . Vitamin D deficiency     Past Surgical History  Procedure Date  . Cholecystectomy   . Knee arthroscopy   . Appendectomy   . Tonsillectomy   . Back surgery   . Total vaginal hysterectomy     Family History  Problem Relation Age of Onset  . Cancer Mother   . Hypertension Mother   . Coronary artery disease Father   . Hypertension Brother   . Coronary artery disease Brother     History   Social History  . Marital Status: Single    Spouse Name: N/A    Number of Children: 2  . Years of Education: N/A   Occupational History  . Disabled     back problems   Social History Main Topics  . Smoking status: Never Smoker   . Smokeless tobacco: Not on file  . Alcohol Use: No  . Drug Use: No  . Sexually Active:    Other  Topics Concern  . Not on file   Social History Narrative  . No narrative on file    Review of Systems:  All systems reviewed.  They are negative to the above problem except as previously stated.  Vital Signs: BP 118/62  Pulse 65  Ht 5\' 9"  (1.753 m)  Wt 377 lb (171.006 kg)  BMI 55.67 kg/m2  Physical Exam  HEENT:  Normocephalic, atraumatic. EOMI, PERRLA.  Neck: JVP is normal. No thyromegaly. No bruits.  Lungs: clear to auscultation. No rales no wheezes.  Heart: Regular rate and rhythm. Normal S1, S2. No S3.   No significant murmurs. PMI not displaced.  Abdomen:  Supple, nontender. Normal bowel sounds. No masses. No hepatomegaly.  Extremities:   Good distal pulses throughout. No lower extremity edema.  Musculoskeletal :moving all extremities.  Neuro:   alert and oriented x3.  CN II-XII grossly intact.   Assessment and Plan:

## 2010-11-07 NOTE — Assessment & Plan Note (Signed)
Will need to follow. 

## 2010-11-07 NOTE — Assessment & Plan Note (Signed)
Improved with wt loss

## 2010-11-07 NOTE — Assessment & Plan Note (Signed)
Mild by remote cath.  Normal myoview recently.  Patient denies symptoms   Follow.

## 2010-11-15 ENCOUNTER — Ambulatory Visit
Admission: RE | Admit: 2010-11-15 | Discharge: 2010-11-15 | Disposition: A | Discharge status: Home / Self Care | Payer: Medicare Other | Source: Ambulatory Visit | Attending: Obstetrics and Gynecology | Admitting: Obstetrics and Gynecology

## 2010-11-15 DIAGNOSIS — Z1231 Encounter for screening mammogram for malignant neoplasm of breast: Secondary | ICD-10-CM

## 2010-11-22 ENCOUNTER — Encounter: Payer: Self-pay | Admitting: Internal Medicine

## 2010-11-23 ENCOUNTER — Encounter: Payer: Self-pay | Admitting: Internal Medicine

## 2010-11-29 ENCOUNTER — Encounter (INDEPENDENT_AMBULATORY_CARE_PROVIDER_SITE_OTHER): Payer: Medicare Other | Admitting: Psychiatry

## 2010-11-29 DIAGNOSIS — F3189 Other bipolar disorder: Secondary | ICD-10-CM

## 2010-12-20 ENCOUNTER — Encounter (INDEPENDENT_AMBULATORY_CARE_PROVIDER_SITE_OTHER): Payer: Medicare Other | Admitting: Psychology

## 2010-12-20 DIAGNOSIS — F3289 Other specified depressive episodes: Secondary | ICD-10-CM

## 2010-12-20 DIAGNOSIS — F329 Major depressive disorder, single episode, unspecified: Secondary | ICD-10-CM

## 2010-12-20 DIAGNOSIS — F09 Unspecified mental disorder due to known physiological condition: Secondary | ICD-10-CM

## 2011-01-03 ENCOUNTER — Encounter (HOSPITAL_COMMUNITY): Payer: Medicare Other | Admitting: Psychology

## 2011-01-04 ENCOUNTER — Other Ambulatory Visit: Payer: Self-pay | Admitting: Internal Medicine

## 2011-01-04 ENCOUNTER — Encounter (HOSPITAL_BASED_OUTPATIENT_CLINIC_OR_DEPARTMENT_OTHER): Payer: Medicare Other | Admitting: Internal Medicine

## 2011-01-04 DIAGNOSIS — D696 Thrombocytopenia, unspecified: Secondary | ICD-10-CM

## 2011-01-04 LAB — CBC WITH DIFFERENTIAL/PLATELET
Basophils Absolute: 0 10*3/uL (ref 0.0–0.1)
Eosinophils Absolute: 0.2 10*3/uL (ref 0.0–0.5)
HCT: 42.6 % (ref 34.8–46.6)
HGB: 14.8 g/dL (ref 11.6–15.9)
MONO#: 0.5 10*3/uL (ref 0.1–0.9)
NEUT#: 2.8 10*3/uL (ref 1.5–6.5)
NEUT%: 48.4 % (ref 38.4–76.8)
WBC: 5.8 10*3/uL (ref 3.9–10.3)
lymph#: 2.3 10*3/uL (ref 0.9–3.3)

## 2011-01-04 LAB — COMPREHENSIVE METABOLIC PANEL
Albumin: 4.1 g/dL (ref 3.5–5.2)
BUN: 9 mg/dL (ref 6–23)
CO2: 29 mEq/L (ref 19–32)
Calcium: 9.3 mg/dL (ref 8.4–10.5)
Chloride: 102 mEq/L (ref 96–112)
Creatinine, Ser: 0.84 mg/dL (ref 0.50–1.10)
Potassium: 4 mEq/L (ref 3.5–5.3)

## 2011-01-31 ENCOUNTER — Encounter (HOSPITAL_COMMUNITY): Payer: Medicare Other | Admitting: Psychiatry

## 2011-02-02 ENCOUNTER — Encounter (HOSPITAL_COMMUNITY): Payer: Medicare Other | Admitting: Psychiatry

## 2011-02-03 ENCOUNTER — Encounter (HOSPITAL_COMMUNITY): Payer: Medicare Other | Admitting: Psychology

## 2011-03-02 ENCOUNTER — Encounter (INDEPENDENT_AMBULATORY_CARE_PROVIDER_SITE_OTHER): Payer: Medicare Other | Admitting: Psychiatry

## 2011-03-02 DIAGNOSIS — F3189 Other bipolar disorder: Secondary | ICD-10-CM

## 2011-04-03 ENCOUNTER — Telehealth: Payer: Self-pay | Admitting: Urgent Care

## 2011-04-03 ENCOUNTER — Ambulatory Visit: Payer: No Typology Code available for payment source | Admitting: Urgent Care

## 2011-04-03 NOTE — Telephone Encounter (Signed)
Please send letter-missed appt. Thanks

## 2011-04-03 NOTE — Telephone Encounter (Signed)
Pt was a no show

## 2011-04-04 ENCOUNTER — Encounter: Payer: Self-pay | Admitting: Urgent Care

## 2011-04-04 NOTE — Telephone Encounter (Signed)
Letter was mailed for patient to call and set up OV

## 2011-05-11 ENCOUNTER — Ambulatory Visit (INDEPENDENT_AMBULATORY_CARE_PROVIDER_SITE_OTHER): Payer: No Typology Code available for payment source | Admitting: Psychiatry

## 2011-05-11 ENCOUNTER — Encounter (HOSPITAL_COMMUNITY): Payer: No Typology Code available for payment source | Admitting: Psychiatry

## 2011-05-11 DIAGNOSIS — F3189 Other bipolar disorder: Secondary | ICD-10-CM

## 2011-05-11 MED ORDER — ARIPIPRAZOLE 10 MG PO TABS: 5.0000 mg | ORAL_TABLET | Freq: Every day | ORAL | Status: DC

## 2011-05-11 NOTE — Progress Notes (Signed)
Allison Estabrook Rich[id number]  Subjective Patient came in today for her followup appointment. Recently she has been feeling more depressed and sad. Her best friend died in the summer and since then she has notice more isolated withdrawn with poor sleep and no energy. She's overwhelmed despite she is hoping the holidays will bring her mood back. I have notice she has start feeling more depressed since Abilify as discontinued. Patient told that she was doing very well without Abilify however now she realized she may needed back. She denies any side effects of medication. She denies any agitation anger or mood swings but admitted to depressed socially isolated were drawn.  Mental Status Examination Patient is casually dressed and fairly groomed. She is morbid obese. Her speech is slow soft but clear. She has poor attention and poor concentration. Her thought process is slow but logical linear goal-directed. She denies any active or passive suicidal thoughts or homicidal thoughts. She described her mood as sad and depressed and her affect is constricted. She's alert oriented x3. Her insight judgment and pulse control is okay  Current Medication Current outpatient prescriptions:aspirin 81 MG EC tablet, Take 81 mg by mouth daily.  , Disp: , Rfl: ;  atorvastatin (LIPITOR) 40 MG tablet, Take 40 mg by mouth daily.  , Disp: , Rfl: ;  desvenlafaxine (PRISTIQ) 50 MG 24 hr tablet, Take 50 mg by mouth daily.  , Disp: , Rfl: ;  diazepam (VALIUM) 5 MG tablet, Take 5 mg by mouth daily.  , Disp: , Rfl:  ergocalciferol (VITAMIN D2) 50000 UNITS capsule, Take 50,000 Units by mouth once a week.  , Disp: , Rfl: ;  furosemide (LASIX) 20 MG tablet, Take 20 mg alternating with 40 mg every other day., Disp: 45 tablet, Rfl: 11;  gabapentin (NEURONTIN) 300 MG capsule, Take 1 tab in the AM, 2 tabs at noon, 2 tabs bedtime, Disp: , Rfl: ;  levothyroxine (SYNTHROID, LEVOTHROID) 125 MCG tablet, Take 125 mcg by mouth daily.  , Disp: , Rfl:    Multiple Vitamin (MULTIVITAMIN) tablet, Take 1 tablet by mouth daily.  , Disp: , Rfl: ;  niacin (NIASPAN) 500 MG CR tablet, Take 2,000 mg by mouth at bedtime. , Disp: , Rfl: ;  omeprazole (PRILOSEC) 20 MG capsule, Take 20 mg by mouth 2 (two) times daily. , Disp: , Rfl: ;  promethazine (PHENERGAN) 25 MG tablet, Take 1 tablet by mouth as needed., Disp: , Rfl:  verapamil (COVERA HS) 180 MG (CO) 24 hr tablet, Take 180 mg by mouth at bedtime.  , Disp: , Rfl: ;  ARIPiprazole (ABILIFY) 10 MG tablet, Take 0.5 tablets (5 mg total) by mouth daily., Disp: 30 tablet, Rfl: 1;  divalproex (DEPAKOTE) 500 MG EC tablet, Take 500 mg by mouth 2 (two) times daily. , Disp: , Rfl: ;  HYDROcodone-acetaminophen (LORTAB) 7.5-500 MG per tablet, Take 1 tablet by mouth as needed., Disp: , Rfl:  ibuprofen (ADVIL,MOTRIN) 800 MG tablet, Take 1 tablet by mouth daily., Disp: , Rfl: ;  lubiprostone (AMITIZA) 24 MCG capsule, Take 24 mcg by mouth 2 (two) times daily with a meal.  , Disp: , Rfl: ;  methocarbamol (ROBAXIN) 750 MG tablet, Take 750 mg by mouth 2 (two) times daily.  , Disp: , Rfl: ;  mupirocin (BACTROBAN) 2 % ointment, Apply 1 application topically as needed., Disp: , Rfl:  oxyCODONE-acetaminophen (PERCOCET) 5-325 MG per tablet, Take 1 tablet by mouth as needed., Disp: , Rfl:    Assessment Bipolar disorder NOS  Plan I will restart Abilify 5 mg daily. She used to take 10 mg however he start with low-dose. We will continue her other medication including Depakote does take and Valium. I've explained risks and benefits of medication in length. She had cut down somewhat for the pain medication. I recommended to call us if she has question or concern about the medication. I also encouraged her to see a therapist regularly. I will see her again in 4-6 weeks

## 2011-05-12 ENCOUNTER — Other Ambulatory Visit (HOSPITAL_COMMUNITY): Payer: Self-pay | Admitting: Psychiatry

## 2011-05-25 ENCOUNTER — Encounter (HOSPITAL_COMMUNITY): Payer: No Typology Code available for payment source | Admitting: Psychiatry

## 2011-06-01 ENCOUNTER — Ambulatory Visit: Payer: Medicare Other | Admitting: Gastroenterology

## 2011-06-01 ENCOUNTER — Telehealth: Payer: Self-pay | Admitting: Gastroenterology

## 2011-06-01 NOTE — Telephone Encounter (Signed)
NO show X 2. 

## 2011-06-01 NOTE — Telephone Encounter (Signed)
Pt was a no show

## 2011-06-02 ENCOUNTER — Ambulatory Visit (HOSPITAL_COMMUNITY): Payer: Medicare Other | Admitting: Psychology

## 2011-06-20 ENCOUNTER — Other Ambulatory Visit (HOSPITAL_COMMUNITY): Payer: Self-pay | Admitting: Psychiatry

## 2011-06-20 DIAGNOSIS — F319 Bipolar disorder, unspecified: Secondary | ICD-10-CM

## 2011-06-20 MED ORDER — DIVALPROEX SODIUM ER 500 MG PO TB24: ORAL_TABLET | ORAL | Status: DC

## 2011-06-20 MED ORDER — DIAZEPAM 5 MG PO TABS: 5.0000 mg | ORAL_TABLET | Freq: Every day | ORAL | Status: DC

## 2011-06-20 MED ORDER — DIVALPROEX SODIUM 500 MG PO DR TAB: 500.0000 mg | DELAYED_RELEASE_TABLET | Freq: Two times a day (BID) | ORAL | Status: DC

## 2011-06-22 ENCOUNTER — Ambulatory Visit (HOSPITAL_COMMUNITY): Payer: Medicare Other | Admitting: Psychiatry

## 2011-06-27 ENCOUNTER — Encounter (HOSPITAL_COMMUNITY): Payer: Self-pay | Admitting: *Deleted

## 2011-06-27 ENCOUNTER — Emergency Department (HOSPITAL_COMMUNITY)
Admission: EM | Admit: 2011-06-27 | Discharge: 2011-06-28 | Disposition: A | Discharge status: Other Acute Inpatient Hospital | Payer: Medicare Other | Attending: Psychiatry | Admitting: Psychiatry

## 2011-06-27 DIAGNOSIS — I2510 Atherosclerotic heart disease of native coronary artery without angina pectoris: Secondary | ICD-10-CM | POA: Insufficient documentation

## 2011-06-27 DIAGNOSIS — Z79899 Other long term (current) drug therapy: Secondary | ICD-10-CM | POA: Insufficient documentation

## 2011-06-27 DIAGNOSIS — F3189 Other bipolar disorder: Secondary | ICD-10-CM | POA: Insufficient documentation

## 2011-06-27 DIAGNOSIS — F319 Bipolar disorder, unspecified: Secondary | ICD-10-CM

## 2011-06-27 DIAGNOSIS — I10 Essential (primary) hypertension: Secondary | ICD-10-CM | POA: Insufficient documentation

## 2011-06-27 DIAGNOSIS — R0609 Other forms of dyspnea: Secondary | ICD-10-CM | POA: Insufficient documentation

## 2011-06-27 DIAGNOSIS — IMO0002 Reserved for concepts with insufficient information to code with codable children: Secondary | ICD-10-CM | POA: Insufficient documentation

## 2011-06-27 DIAGNOSIS — R0989 Other specified symptoms and signs involving the circulatory and respiratory systems: Secondary | ICD-10-CM | POA: Insufficient documentation

## 2011-06-27 LAB — RAPID URINE DRUG SCREEN, HOSP PERFORMED
Barbiturates: NOT DETECTED
Benzodiazepines: NOT DETECTED
Cocaine: NOT DETECTED
Opiates: NOT DETECTED
Tetrahydrocannabinol: NOT DETECTED

## 2011-06-27 LAB — CBC
MCH: 30.7 pg (ref 26.0–34.0)
MCV: 90.1 fL (ref 78.0–100.0)
Platelets: 143 10*3/uL — ABNORMAL LOW (ref 150–400)
RBC: 4.46 MIL/uL (ref 3.87–5.11)

## 2011-06-27 LAB — COMPREHENSIVE METABOLIC PANEL
ALT: 11 U/L (ref 0–35)
AST: 19 U/L (ref 0–37)
Albumin: 3.5 g/dL (ref 3.5–5.2)
Calcium: 9.7 mg/dL (ref 8.4–10.5)
Creatinine, Ser: 0.7 mg/dL (ref 0.50–1.10)
Sodium: 139 mEq/L (ref 135–145)
Total Protein: 7.3 g/dL (ref 6.0–8.3)

## 2011-06-27 LAB — URINE MICROSCOPIC-ADD ON

## 2011-06-27 LAB — URINALYSIS, ROUTINE W REFLEX MICROSCOPIC
Bilirubin Urine: NEGATIVE
Hgb urine dipstick: NEGATIVE
Ketones, ur: NEGATIVE mg/dL
Nitrite: NEGATIVE
Urobilinogen, UA: 0.2 mg/dL (ref 0.0–1.0)
pH: 8 (ref 5.0–8.0)

## 2011-06-27 MED ORDER — ZIPRASIDONE MESYLATE 20 MG IM SOLR: 10.0000 mg | Freq: Once | INTRAMUSCULAR | Status: DC

## 2011-06-27 MED ORDER — DIAZEPAM 5 MG PO TABS: 5.0000 mg | ORAL_TABLET | Freq: Every day | ORAL | Status: DC

## 2011-06-27 MED ORDER — FUROSEMIDE 40 MG PO TABS
40.0000 mg | ORAL_TABLET | ORAL | Status: DC
  Filled 2011-06-27: qty 1

## 2011-06-27 MED ORDER — ONDANSETRON HCL 4 MG PO TABS
4.0000 mg | ORAL_TABLET | Freq: Three times a day (TID) | ORAL | Status: DC | PRN
  Administered 2011-06-27: 4 mg via ORAL
  Filled 2011-06-27: qty 1

## 2011-06-27 MED ORDER — NIACIN ER (ANTIHYPERLIPIDEMIC) 500 MG PO TBCR
500.0000 mg | EXTENDED_RELEASE_TABLET | Freq: Every day | ORAL | Status: DC
  Administered 2011-06-27: 500 mg via ORAL
  Filled 2011-06-27 (×2): qty 1

## 2011-06-27 MED ORDER — FUROSEMIDE 20 MG PO TABS: 20.0000 mg | ORAL_TABLET | Freq: Every day | ORAL | Status: DC

## 2011-06-27 MED ORDER — VITAMIN D3 25 MCG (1000 UNIT) PO TABS
2000.0000 [IU] | ORAL_TABLET | Freq: Every day | ORAL | Status: DC
  Administered 2011-06-27: 2000 [IU] via ORAL
  Filled 2011-06-27 (×2): qty 2

## 2011-06-27 MED ORDER — ASPIRIN EC 81 MG PO TBEC
81.0000 mg | DELAYED_RELEASE_TABLET | Freq: Every day | ORAL | Status: DC
  Filled 2011-06-27: qty 1

## 2011-06-27 MED ORDER — DESVENLAFAXINE SUCCINATE ER 50 MG PO TB24
50.0000 mg | ORAL_TABLET | Freq: Every day | ORAL | Status: DC
Start: 2011-06-28 — End: 2011-06-28
  Filled 2011-06-27: qty 1

## 2011-06-27 MED ORDER — GABAPENTIN 300 MG PO CAPS
300.0000 mg | ORAL_CAPSULE | Freq: Every day | ORAL | Status: DC
  Filled 2011-06-27: qty 1

## 2011-06-27 MED ORDER — METHOCARBAMOL 500 MG PO TABS
750.0000 mg | ORAL_TABLET | Freq: Every day | ORAL | Status: DC
  Administered 2011-06-27: 750 mg via ORAL
  Filled 2011-06-27: qty 2

## 2011-06-27 MED ORDER — GABAPENTIN 300 MG PO CAPS
600.0000 mg | ORAL_CAPSULE | ORAL | Status: DC
  Administered 2011-06-27: 600 mg via ORAL
  Filled 2011-06-27 (×3): qty 2

## 2011-06-27 MED ORDER — NICOTINE 21 MG/24HR TD PT24: 21.0000 mg | MEDICATED_PATCH | Freq: Every day | TRANSDERMAL | Status: DC

## 2011-06-27 MED ORDER — VERAPAMIL HCL ER 180 MG PO TBCR
180.0000 mg | EXTENDED_RELEASE_TABLET | Freq: Every day | ORAL | Status: DC
  Filled 2011-06-27: qty 1

## 2011-06-27 MED ORDER — ALUM & MAG HYDROXIDE-SIMETH 200-200-20 MG/5ML PO SUSP: 30.0000 mL | ORAL | Status: DC | PRN

## 2011-06-27 MED ORDER — IBUPROFEN 200 MG PO TABS
600.0000 mg | ORAL_TABLET | Freq: Three times a day (TID) | ORAL | Status: DC | PRN
  Administered 2011-06-27: 600 mg via ORAL
  Filled 2011-06-27: qty 3

## 2011-06-27 MED ORDER — DIAZEPAM 5 MG PO TABS
5.0000 mg | ORAL_TABLET | Freq: Once | ORAL | Status: AC
  Administered 2011-06-27: 5 mg via ORAL
  Filled 2011-06-27: qty 1

## 2011-06-27 MED ORDER — DIVALPROEX SODIUM ER 500 MG PO TB24: 500.0000 mg | ORAL_TABLET | Freq: Every day | ORAL | Status: DC

## 2011-06-27 MED ORDER — ARIPIPRAZOLE 5 MG PO TABS
5.0000 mg | ORAL_TABLET | Freq: Every day | ORAL | Status: DC
  Filled 2011-06-27: qty 1

## 2011-06-27 MED ORDER — LUBIPROSTONE 24 MCG PO CAPS
24.0000 ug | ORAL_CAPSULE | Freq: Two times a day (BID) | ORAL | Status: DC
  Filled 2011-06-27 (×2): qty 1

## 2011-06-27 MED ORDER — DIVALPROEX SODIUM ER 500 MG PO TB24
1000.0000 mg | ORAL_TABLET | Freq: Every day | ORAL | Status: DC
  Administered 2011-06-27: 1000 mg via ORAL
  Filled 2011-06-27 (×2): qty 2

## 2011-06-27 MED ORDER — ADULT MULTIVITAMIN W/MINERALS CH: 1.0000 | ORAL_TABLET | Freq: Every day | ORAL | Status: DC

## 2011-06-27 MED ORDER — SIMVASTATIN 20 MG PO TABS: 20.0000 mg | ORAL_TABLET | Freq: Every day | ORAL | Status: DC

## 2011-06-27 MED ORDER — GABAPENTIN 300 MG PO CAPS: 300.0000 mg | ORAL_CAPSULE | ORAL | Status: DC

## 2011-06-27 MED ORDER — MELOXICAM 15 MG PO TABS
15.0000 mg | ORAL_TABLET | Freq: Every evening | ORAL | Status: DC
  Administered 2011-06-27: 15 mg via ORAL
  Filled 2011-06-27 (×2): qty 1

## 2011-06-27 MED ORDER — FUROSEMIDE 20 MG PO TABS: 20.0000 mg | ORAL_TABLET | ORAL | Status: DC

## 2011-06-27 MED ORDER — LEVOTHYROXINE SODIUM 125 MCG PO TABS
125.0000 ug | ORAL_TABLET | Freq: Every day | ORAL | Status: DC
  Filled 2011-06-27 (×2): qty 1

## 2011-06-27 MED ORDER — ACETAMINOPHEN 325 MG PO TABS
650.0000 mg | ORAL_TABLET | ORAL | Status: DC | PRN
  Administered 2011-06-27: 650 mg via ORAL
  Filled 2011-06-27: qty 2

## 2011-06-27 MED ORDER — PANTOPRAZOLE SODIUM 40 MG PO TBEC
40.0000 mg | DELAYED_RELEASE_TABLET | Freq: Every day | ORAL | Status: DC
  Administered 2011-06-27: 40 mg via ORAL
  Filled 2011-06-27: qty 1

## 2011-06-27 MED ORDER — ROSUVASTATIN CALCIUM 20 MG PO TABS
20.0000 mg | ORAL_TABLET | Freq: Every day | ORAL | Status: DC
  Administered 2011-06-27: 20 mg via ORAL
  Filled 2011-06-27 (×2): qty 1

## 2011-06-27 NOTE — ED Notes (Signed)
Per pt's sister [name], pt has had adverse reactions to Haldol and Ativan during previous hospitalizations and requests the pt is not given the medications.

## 2011-06-27 NOTE — ED Notes (Signed)
Pt in c/o hallucinations and paranoia over last few days, pt is currently being treated for bipolar disorder and states she is compliant with her medications. Pt denies being suicidal but is afraid one of her hallucinations will hurt her.
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
## 2011-06-27 NOTE — ED Provider Notes (Signed)
History     CSN: [id number]  Arrival date & time 06/27/11  1424   First MD Initiated Contact with Patient 06/27/11 1513      Chief Complaint  Patient presents with  . Medical Clearance  . Hallucinations    (Consider location/radiation/quality/duration/timing/severity/associated sxs/prior treatment) HPI... level V caveat for bipolar illness and schizophrenia... she has long-standing mental health problems. Has been hearing voices lately. Has been admitted to [facility] x2 in the past year.  Sister reports paranoia, increased agitation, near death experience this summer. She has been taken medications.  She is in therapy with a [facility] psychiatrist  Past Medical History  Diagnosis Date  . Hyperlipidemia   . CAD (coronary artery disease)   . Bipolar 1 disorder   . Schizophrenia   . GERD (gastroesophageal reflux disease)   . Hyperthyroidism   . Chronic back pain   . Morbid obesity   . Dyspnea     PFT 03/05/09 FEV1 2.77(98%), FVC 3.25(86%), FEV1% 85, TLC 5.88(99%), DLCO 60% ,  Methacholine challenge 03/16/09 normal ,  CT chest 03/12/09 no pulmonary disease  . Anxiety   . Arthritis   . Depression   . OSA on CPAP     2 liters  . HTN (hypertension)   . History of colonoscopy 10/17/2002    by Dr Rehman-> distal non-specific proctitis, small ext hemorrhoids,   . Fungal infection   . Cellulitis   . Contusion of sacrum   . Migraine headache   . Vitamin D deficiency     Past Surgical History  Procedure Date  . Cholecystectomy   . Knee arthroscopy   . Appendectomy   . Tonsillectomy   . Back surgery   . Total vaginal hysterectomy     Family History  Problem Relation Age of Onset  . Cancer Mother   . Hypertension Mother   . Coronary artery disease Father   . Hypertension Brother   . Coronary artery disease Brother     History  Substance Use Topics  . Smoking status: Never Smoker   . Smokeless tobacco: Not on file  . Alcohol Use: No    OB History    Grav Para Term Preterm Abortions TAB SAB Ect Mult Living                  Review of Systems  Unable to perform ROS: Other    Allergies  Ativan and Haldol  Home Medications   Current Outpatient Rx  Name Route Sig Dispense Refill  . ARIPIPRAZOLE 10 MG PO TABS Oral Take 0.5 tablets (5 mg total) by mouth daily. 30 tablet 1  . ASPIRIN 81 MG PO TBEC Oral Take 81 mg by mouth daily.      . ATORVASTATIN CALCIUM 40 MG PO TABS Oral Take 40 mg by mouth daily.      . DESVENLAFAXINE SUCCINATE ER 50 MG PO TB24 Oral Take 50 mg by mouth daily.      Marland Kitchen DIAZEPAM 5 MG PO TABS Oral Take 1 tablet (5 mg total) by mouth daily. 30 tablet 0  . DIVALPROEX SODIUM ER 500 MG PO TB24  Take 2 at bed time 60 tablet 0  . ERGOCALCIFEROL 50000 UNITS PO CAPS Oral Take 50,000 Units by mouth once a week.      . FUROSEMIDE 20 MG PO TABS  Take 20 mg alternating with 40 mg every other day. 45 tablet 11  . GABAPENTIN 300 MG PO CAPS  Take 1  tab in the AM, 2 tabs at noon, 2 tabs bedtime    . HYDROCODONE-ACETAMINOPHEN 7.5-500 MG PO TABS Oral Take 1 tablet by mouth as needed.    . IBUPROFEN 800 MG PO TABS Oral Take 1 tablet by mouth daily.    Marland Kitchen LEVOTHYROXINE SODIUM 125 MCG PO TABS Oral Take 125 mcg by mouth daily.      . LUBIPROSTONE 24 MCG PO CAPS Oral Take 24 mcg by mouth 2 (two) times daily with a meal.      . METHOCARBAMOL 750 MG PO TABS Oral Take 750 mg by mouth 2 (two) times daily.     Marland Kitchen ONE-DAILY MULTI VITAMINS PO TABS Oral Take 1 tablet by mouth daily.      Marland Kitchen MUPIROCIN 2 % EX OINT Topical Apply 1 application topically as needed.    Marland Kitchen NIACIN ER (ANTIHYPERLIPIDEMIC) 500 MG PO TBCR Oral Take 2,000 mg by mouth at bedtime.     . OMEPRAZOLE 20 MG PO CPDR Oral Take 20 mg by mouth 2 (two) times daily.     . OXYCODONE-ACETAMINOPHEN 5-325 MG PO TABS Oral Take 1 tablet by mouth as needed.    Marland Kitchen PROMETHAZINE HCL 25 MG PO TABS Oral Take 1 tablet by mouth as needed.    Marland Kitchen VERAPAMIL HCL 180 MG (CO) PO TB24 Oral Take 180 mg by mouth at  bedtime.        BP 148/86  Pulse 85  Temp(Src) 98.5 F (36.9 C) (Oral)  Resp 16  SpO2 99%  Physical Exam  Nursing note and vitals reviewed. Constitutional: She is oriented to person, place, and time.       Agitated, morbidly obese  HENT:  Head: Normocephalic and atraumatic.  Eyes: Conjunctivae and EOM are normal. Pupils are equal, round, and reactive to light.  Neck: Normal range of motion. Neck supple.  Cardiovascular: Normal rate and regular rhythm.   Pulmonary/Chest: Effort normal and breath sounds normal.  Abdominal: Soft. Bowel sounds are normal.  Musculoskeletal: Normal range of motion.  Neurological: She is alert and oriented to person, place, and time.  Skin: Skin is warm and dry.  Psychiatric:       Flight of ideas, agitated, states she is having auditory hallucinations    ED Course  Procedures (including critical care time)  Labs Reviewed  URINALYSIS, ROUTINE W REFLEX MICROSCOPIC - Abnormal; Notable for the following:    APPearance CLOUDY (*)    Leukocytes, UA TRACE (*)    All other components within normal limits  URINE MICROSCOPIC-ADD ON - Abnormal; Notable for the following:    Squamous Epithelial / LPF FEW (*)    Bacteria, UA FEW (*)    All other components within normal limits  URINE RAPID DRUG SCREEN (HOSP PERFORMED)  CBC  COMPREHENSIVE METABOLIC PANEL  ETHANOL   No results found.   No diagnosis found.    MDM  Behavioral health consult. Probable admission        Donnetta Hutching, MD 06/27/11 1550

## 2011-06-28 ENCOUNTER — Inpatient Hospital Stay (HOSPITAL_COMMUNITY)
Admission: EM | Admit: 2011-06-28 | Discharge: 2011-07-03 | DRG: 885 | Disposition: A | Discharge status: Home / Self Care | Payer: No Typology Code available for payment source | Source: Other Acute Inpatient Hospital | Attending: Psychiatry | Admitting: Psychiatry

## 2011-06-28 ENCOUNTER — Encounter (HOSPITAL_COMMUNITY): Payer: Self-pay | Admitting: *Deleted

## 2011-06-28 DIAGNOSIS — I10 Essential (primary) hypertension: Secondary | ICD-10-CM

## 2011-06-28 DIAGNOSIS — R45851 Suicidal ideations: Secondary | ICD-10-CM

## 2011-06-28 DIAGNOSIS — F4310 Post-traumatic stress disorder, unspecified: Secondary | ICD-10-CM | POA: Diagnosis present

## 2011-06-28 DIAGNOSIS — E785 Hyperlipidemia, unspecified: Secondary | ICD-10-CM

## 2011-06-28 DIAGNOSIS — E039 Hypothyroidism, unspecified: Secondary | ICD-10-CM

## 2011-06-28 DIAGNOSIS — M129 Arthropathy, unspecified: Secondary | ICD-10-CM

## 2011-06-28 DIAGNOSIS — Z79899 Other long term (current) drug therapy: Secondary | ICD-10-CM

## 2011-06-28 DIAGNOSIS — I252 Old myocardial infarction: Secondary | ICD-10-CM

## 2011-06-28 DIAGNOSIS — R51 Headache: Secondary | ICD-10-CM

## 2011-06-28 DIAGNOSIS — K5900 Constipation, unspecified: Secondary | ICD-10-CM

## 2011-06-28 DIAGNOSIS — F259 Schizoaffective disorder, unspecified: Principal | ICD-10-CM

## 2011-06-28 DIAGNOSIS — Z888 Allergy status to other drugs, medicaments and biological substances status: Secondary | ICD-10-CM

## 2011-06-28 DIAGNOSIS — I2510 Atherosclerotic heart disease of native coronary artery without angina pectoris: Secondary | ICD-10-CM

## 2011-06-28 DIAGNOSIS — G8929 Other chronic pain: Secondary | ICD-10-CM

## 2011-06-28 DIAGNOSIS — F411 Generalized anxiety disorder: Secondary | ICD-10-CM

## 2011-06-28 DIAGNOSIS — G4733 Obstructive sleep apnea (adult) (pediatric): Secondary | ICD-10-CM

## 2011-06-28 DIAGNOSIS — K219 Gastro-esophageal reflux disease without esophagitis: Secondary | ICD-10-CM

## 2011-06-28 DIAGNOSIS — Z7982 Long term (current) use of aspirin: Secondary | ICD-10-CM

## 2011-06-28 DIAGNOSIS — F250 Schizoaffective disorder, bipolar type: Secondary | ICD-10-CM | POA: Diagnosis present

## 2011-06-28 DIAGNOSIS — F319 Bipolar disorder, unspecified: Secondary | ICD-10-CM

## 2011-06-28 DIAGNOSIS — M549 Dorsalgia, unspecified: Secondary | ICD-10-CM

## 2011-06-28 HISTORY — DX: Hypothyroidism, unspecified: E03.9

## 2011-06-28 HISTORY — DX: Other complications of anesthesia, initial encounter: T88.59XA

## 2011-06-28 HISTORY — DX: Adverse effect of unspecified anesthetic, initial encounter: T41.45XA

## 2011-06-28 HISTORY — DX: Acute myocardial infarction, unspecified: I21.9

## 2011-06-28 LAB — PLATELET COUNT: Platelets: 154 10*3/uL (ref 150–400)

## 2011-06-28 MED ORDER — DIAZEPAM 5 MG PO TABS
5.0000 mg | ORAL_TABLET | Freq: Every day | ORAL | Status: DC
  Administered 2011-06-28 – 2011-07-02 (×5): 5 mg via ORAL
  Filled 2011-06-28 (×5): qty 1

## 2011-06-28 MED ORDER — LEVOTHYROXINE SODIUM 125 MCG PO TABS
125.0000 ug | ORAL_TABLET | Freq: Every day | ORAL | Status: DC
  Administered 2011-06-28: 125 ug via ORAL
  Filled 2011-06-28: qty 1

## 2011-06-28 MED ORDER — ADULT MULTIVITAMIN W/MINERALS CH
1.0000 | ORAL_TABLET | Freq: Every day | ORAL | Status: DC
  Administered 2011-06-28 – 2011-07-03 (×6): 1 via ORAL
  Filled 2011-06-28 (×6): qty 1

## 2011-06-28 MED ORDER — NIACIN ER 500 MG PO TBCR
500.0000 mg | EXTENDED_RELEASE_TABLET | Freq: Every day | ORAL | Status: DC
  Administered 2011-06-28 – 2011-07-01 (×4): 500 mg via ORAL
  Filled 2011-06-28 (×6): qty 1

## 2011-06-28 MED ORDER — MAGNESIUM HYDROXIDE 400 MG/5ML PO SUSP
30.0000 mL | Freq: Every day | ORAL | Status: DC | PRN
  Administered 2011-07-03: 30 mL via ORAL

## 2011-06-28 MED ORDER — FUROSEMIDE 20 MG PO TABS
20.0000 mg | ORAL_TABLET | Freq: Every day | ORAL | Status: DC
  Administered 2011-06-28: 20 mg via ORAL
  Filled 2011-06-28: qty 1

## 2011-06-28 MED ORDER — MELOXICAM 7.5 MG PO TABS
15.0000 mg | ORAL_TABLET | Freq: Every evening | ORAL | Status: DC
  Administered 2011-06-28 – 2011-07-02 (×5): 15 mg via ORAL
  Filled 2011-06-28 (×9): qty 2

## 2011-06-28 MED ORDER — FUROSEMIDE 20 MG PO TABS
20.0000 mg | ORAL_TABLET | ORAL | Status: DC
  Administered 2011-06-28 – 2011-07-02 (×3): 20 mg via ORAL
  Filled 2011-06-28 (×4): qty 1

## 2011-06-28 MED ORDER — GABAPENTIN 300 MG PO CAPS
300.0000 mg | ORAL_CAPSULE | ORAL | Status: DC
  Administered 2011-06-28 – 2011-07-03 (×6): 300 mg via ORAL
  Filled 2011-06-28 (×7): qty 1

## 2011-06-28 MED ORDER — SIMVASTATIN 20 MG PO TABS
20.0000 mg | ORAL_TABLET | Freq: Every day | ORAL | Status: DC
  Administered 2011-06-28 – 2011-07-03 (×6): 20 mg via ORAL
  Filled 2011-06-28 (×7): qty 1

## 2011-06-28 MED ORDER — SUMATRIPTAN SUCCINATE 50 MG PO TABS
50.0000 mg | ORAL_TABLET | ORAL | Status: DC | PRN
  Administered 2011-06-28 – 2011-06-29 (×2): 50 mg via ORAL
  Filled 2011-06-28 (×2): qty 1

## 2011-06-28 MED ORDER — GABAPENTIN 600 MG PO TABS
600.0000 mg | ORAL_TABLET | Freq: Two times a day (BID) | ORAL | Status: DC
  Filled 2011-06-28 (×2): qty 1

## 2011-06-28 MED ORDER — DIVALPROEX SODIUM ER 500 MG PO TB24
1000.0000 mg | ORAL_TABLET | Freq: Every day | ORAL | Status: DC
  Administered 2011-06-28 – 2011-07-02 (×5): 1000 mg via ORAL
  Filled 2011-06-28 (×6): qty 2

## 2011-06-28 MED ORDER — ARIPIPRAZOLE 10 MG PO TABS
10.0000 mg | ORAL_TABLET | Freq: Every day | ORAL | Status: DC
  Administered 2011-06-28 – 2011-07-03 (×6): 10 mg via ORAL
  Filled 2011-06-28 (×6): qty 1

## 2011-06-28 MED ORDER — GABAPENTIN 600 MG PO TABS
600.0000 mg | ORAL_TABLET | Freq: Every day | ORAL | Status: DC
  Administered 2011-06-28 – 2011-07-02 (×5): 600 mg via ORAL
  Filled 2011-06-28 (×6): qty 1

## 2011-06-28 MED ORDER — DESVENLAFAXINE SUCCINATE ER 50 MG PO TB24
50.0000 mg | ORAL_TABLET | Freq: Every day | ORAL | Status: DC
  Filled 2011-06-28 (×2): qty 1

## 2011-06-28 MED ORDER — FUROSEMIDE 40 MG PO TABS
40.0000 mg | ORAL_TABLET | ORAL | Status: DC
  Administered 2011-06-29 – 2011-07-03 (×3): 40 mg via ORAL
  Filled 2011-06-28 (×4): qty 1

## 2011-06-28 MED ORDER — DULOXETINE HCL 30 MG PO CPEP
30.0000 mg | ORAL_CAPSULE | Freq: Every day | ORAL | Status: DC
  Administered 2011-06-29: 30 mg via ORAL
  Filled 2011-06-28: qty 1

## 2011-06-28 MED ORDER — DULOXETINE HCL 30 MG PO CPEP
30.0000 mg | ORAL_CAPSULE | Freq: Every day | ORAL | Status: DC
Start: 2011-06-28 — End: 2011-06-28
  Administered 2011-06-28: 30 mg via ORAL
  Filled 2011-06-28 (×2): qty 1

## 2011-06-28 MED ORDER — VITAMIN D3 25 MCG (1000 UNIT) PO TABS
2000.0000 [IU] | ORAL_TABLET | Freq: Every day | ORAL | Status: DC
  Administered 2011-06-28 – 2011-07-03 (×6): 2000 [IU] via ORAL
  Filled 2011-06-28 (×7): qty 2

## 2011-06-28 MED ORDER — VERAPAMIL HCL ER 180 MG PO TBCR
180.0000 mg | EXTENDED_RELEASE_TABLET | Freq: Every day | ORAL | Status: DC
  Administered 2011-06-29 – 2011-07-03 (×5): 180 mg via ORAL
  Filled 2011-06-28 (×6): qty 1

## 2011-06-28 MED ORDER — ACETAMINOPHEN 325 MG PO TABS
650.0000 mg | ORAL_TABLET | Freq: Four times a day (QID) | ORAL | Status: DC | PRN
  Administered 2011-06-28 – 2011-07-02 (×3): 650 mg via ORAL

## 2011-06-28 MED ORDER — LUBIPROSTONE 24 MCG PO CAPS
24.0000 ug | ORAL_CAPSULE | Freq: Two times a day (BID) | ORAL | Status: DC
  Administered 2011-06-28 – 2011-07-03 (×10): 24 ug via ORAL
  Filled 2011-06-28 (×12): qty 1

## 2011-06-28 MED ORDER — VENLAFAXINE HCL ER 75 MG PO CP24
75.0000 mg | ORAL_CAPSULE | Freq: Every day | ORAL | Status: DC
  Administered 2011-06-28: 75 mg via ORAL
  Filled 2011-06-28: qty 1

## 2011-06-28 MED ORDER — ALUM & MAG HYDROXIDE-SIMETH 200-200-20 MG/5ML PO SUSP: 30.0000 mL | ORAL | Status: DC | PRN

## 2011-06-28 MED ORDER — ADULT MULTIVITAMIN W/MINERALS CH
1.0000 | ORAL_TABLET | Freq: Every day | ORAL | Status: DC
  Filled 2011-06-28 (×2): qty 1

## 2011-06-28 MED ORDER — FUROSEMIDE 20 MG PO TABS: 20.0000 mg | ORAL_TABLET | ORAL | Status: DC

## 2011-06-28 MED ORDER — ASPIRIN EC 81 MG PO TBEC
81.0000 mg | DELAYED_RELEASE_TABLET | Freq: Every day | ORAL | Status: DC
  Administered 2011-06-28 – 2011-07-03 (×6): 81 mg via ORAL
  Filled 2011-06-28 (×6): qty 1

## 2011-06-28 MED ORDER — BUTALBITAL-APAP-CAFFEINE 50-325-40 MG PO TABS
1.0000 | ORAL_TABLET | ORAL | Status: AC
  Administered 2011-06-28: 1 via ORAL
  Filled 2011-06-28: qty 1

## 2011-06-28 MED ORDER — GABAPENTIN 600 MG PO TABS
600.0000 mg | ORAL_TABLET | Freq: Every day | ORAL | Status: DC
  Administered 2011-06-28 – 2011-07-02 (×5): 600 mg via ORAL
  Filled 2011-06-28 (×6): qty 1
  Filled 2011-06-28: qty 2

## 2011-06-28 MED ORDER — LEVOTHYROXINE SODIUM 125 MCG PO TABS
125.0000 ug | ORAL_TABLET | Freq: Every day | ORAL | Status: DC
  Administered 2011-06-28 – 2011-07-02 (×5): 125 ug via ORAL
  Filled 2011-06-28 (×7): qty 1

## 2011-06-28 MED ORDER — METHOCARBAMOL 750 MG PO TABS
750.0000 mg | ORAL_TABLET | Freq: Every day | ORAL | Status: DC
  Administered 2011-06-28 – 2011-07-03 (×6): 750 mg via ORAL
  Filled 2011-06-28 (×7): qty 1

## 2011-06-28 MED ORDER — DULOXETINE HCL 30 MG PO CPEP: 30.0000 mg | ORAL_CAPSULE | Freq: Every day | ORAL | Status: DC
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
## 2011-06-28 NOTE — Progress Notes (Signed)
In dayroom on approach. Agreed to speak with Clinical research associate in hallway. Appears flat but does brighten at intervals. Calm and cooperative with assessment. States she has had a good day. Discussed her continuous headache, but now states it has completely resolve since getting the Fioricet. Asked Clinical research associate if it was possible to get a prescription for it to use after DC. Encouraged to discuss that with MD in AM. Also states that since her headache has resolved she hasnt had any AH/VH. Discussed O2 requirements for her CPAP and how she typically uses it at home. Concentrator will be provided. Otherwise appears to be doing well and offers no additional questions. Continues to ambulate with a cane without issue. Denies SI/HI and contracts for safety. POC and medications for the shift reviewed and understanding verbalized. Safety has been maintained with Q15 minute observation. Will continue current POC.

## 2011-06-28 NOTE — Tx Team (Addendum)
Interdisciplinary Treatment Plan Update (Adult)  Date:  06/28/2011  Time Reviewed:  10:50 AM   Progress in Treatment: Attending groups:  No, but new Participating in groups:    Seems willing to do so Taking medication as prescribed:  Yes   Tolerating medication:   Yes Family/Significant other contact made:  Yes Patient understands diagnosis:   Yes Discussing patient identified problems/goals with staff:   Yes Medical problems stabilized or resolved:   Yes Denies suicidal/homicidal ideation:  Denies today Issues/concerns per patient self-inventory:   None Other:  New problem(s) identified: No, Describe:    Reason for Continuation of Hospitalization: Depression Medication stabilization Other; describe control of PTSD symptoms, sleep and appetite disturbances  Interventions implemented related to continuation of hospitalization:  Medication monitoring and adjustment, safety checks Q15 min., group therapy, psychoeducation, collateral contact  Additional comments:  Needs some grief work - possibly Hospice; sleep is increased and appetite is decreased.  Estimated length of stay:  5 days  Discharge Plan:  Follow up with [facility] in [place], will return to live with nephew  New goal(s):  #5 - Return sleep and appetite to normal pattern; #6 - Start work on [employer] on grief issues.  Review of initial/current patient goals per problem list:   1.  Goal(s):  Reduce depression from 10 at admission to no more than 3 at discharge.  Met:  No  Target date:  By Discharge   As evidenced by:  "10" today  2.  Goal(s):  Reduce anxiety from 10 at admission to no more than 3 at discharge.  Met:  No  Target date:  By Discharge   As evidenced by:  "0" today - needs longer  3.  Goal(s):  Deny SI for 48 hours prior to discharge.  Met:  No  Target date:  By Discharge   As evidenced by:  Denies today - needs longer  4.  Goal(s):  Return psychotic symptoms to baseline.  Met:   No  Target date:  By Discharge   As evidenced by:  Denies today - needs longer  Attendees: Patient:  Allison Sharp  06/28/2011 10:33 AM   Family:     Physician:  Dr. Harvie Heck Readling 06/28/2011 10:33 AM   Nursing:   Robbie Louis, RN 06/28/2011     Case Manager:  Ambrose Mantle, LCSW 06/28/2011    Counselor:  Veto Kemps, MT-BC 06/28/2011    Other:   Lynann Bologna, NP 06/28/2011 10:33 AM   Other:      Other:      Other:       Scribe for Treatment Team:   Sarina Ser, 06/28/2011, 10:49 AM

## 2011-06-28 NOTE — Discharge Planning (Signed)
Patient will reside at discharge with her nephew.  She will follow up with Dr. Lolly Mustache and Dr. Kieth Brightly in the [facility] in [place].  Case Manager will refer her to support groups, and will look into Hospice in particular for her grief issues.  Ambrose Mantle, LCSW 06/28/2011, 11:27 AM
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
## 2011-06-28 NOTE — Progress Notes (Signed)
Patient ID: Allison Sharp, female   DOB: [birth year], 56 y.o.   MRN: [id number] Pt is awake and active on the unit this AM. Pt c/o HA pain 10/10 today. PRN medication administered. Pt denies SI/HI and AVH but states that she has had hallucinations within the last 24 hours. Pt is attending groups and is cooperative with staff. New order written for CPAP use with O2 at bed time.  Pt is ambulating well with canes. Writer will continue to monitor.
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
## 2011-06-28 NOTE — Progress Notes (Signed)
Recreation Therapy Notes  06/28/2011         Time: 0930      Group Topic/Focus: The focus of this group is on enhancing the patient's understanding of leisure, barriers to leisure, and the importance of engaging in positive leisure activities upon discharge for improved total health.  Participation Level: Active  Participation Quality: Appropriate and Attentive  Affect: Appropriate  Cognitive: Oriented   Additional Comments: Patient pleasant and cooperative, but questioning why RT encouraged patients to leave their homes and./or participate in activities with others at times. The importance of social supports were discussed, but patient continues to say she is happier alone   Allison Sharp 06/28/2011 12:04 PM

## 2011-06-29 DIAGNOSIS — F259 Schizoaffective disorder, unspecified: Principal | ICD-10-CM

## 2011-06-29 DIAGNOSIS — F4310 Post-traumatic stress disorder, unspecified: Secondary | ICD-10-CM

## 2011-06-29 LAB — T4, FREE: Free T4: 1.11 ng/dL (ref 0.80–1.80)

## 2011-06-29 LAB — T3, FREE: T3, Free: 3 pg/mL (ref 2.3–4.2)

## 2011-06-29 MED ORDER — BISACODYL 5 MG PO TBEC
15.0000 mg | DELAYED_RELEASE_TABLET | Freq: Every day | ORAL | Status: DC | PRN
  Administered 2011-06-29: 15 mg via ORAL
  Filled 2011-06-29: qty 3

## 2011-06-29 MED ORDER — FLEET ENEMA 7-19 GM/118ML RE ENEM
1.0000 | ENEMA | Freq: Two times a day (BID) | RECTAL | Status: DC | PRN
  Administered 2011-06-29: 1 via RECTAL
  Filled 2011-06-29: qty 1

## 2011-06-29 MED ORDER — GLYCERIN (LAXATIVE) 2.1 G RE SUPP
1.0000 | Freq: Two times a day (BID) | RECTAL | Status: DC | PRN
  Administered 2011-06-29: 1 via RECTAL
  Filled 2011-06-29: qty 1

## 2011-06-29 MED ORDER — DULOXETINE HCL 60 MG PO CPEP
60.0000 mg | ORAL_CAPSULE | Freq: Every day | ORAL | Status: DC
  Administered 2011-06-30 – 2011-07-03 (×4): 60 mg via ORAL
  Filled 2011-06-29 (×5): qty 1

## 2011-06-29 MED ORDER — BUTALBITAL-APAP-CAFFEINE 50-325-40 MG PO TABS
1.0000 | ORAL_TABLET | Freq: Three times a day (TID) | ORAL | Status: DC | PRN
  Administered 2011-06-29 – 2011-07-03 (×3): 1 via ORAL
  Filled 2011-06-29: qty 1
  Filled 2011-06-29: qty 9
  Filled 2011-06-29 (×3): qty 1

## 2011-06-29 NOTE — Discharge Planning (Signed)
Met with patient in Aftercare Planning Group.   Reported she slept through the night, which was very unusual for her.  Talked about her "nephew" (who is actually niece's ex-husband) has told her to get well and her home with him will be waiting as soon as she is ready.  Was upbeat, more relaxed affect today.    Discussed Hospice options with her.  Case Manager will look into the agency's [facility] presence, and if possible schedule groups there; however, if she needs to come to [facility] for group work for her grief issues, her niece has told her that they will facilitate her transportation for this.  Ambrose Mantle, LCSW 06/29/2011, 12:13 PM

## 2011-06-29 NOTE — Progress Notes (Addendum)
Patient in her bed during this assessment. Pt has C-Pap machine with oxygen running at 2 L per min. She stated; "I must have my oxygen at 2 L, whenever I'm in bed". She reports having a good day. She denies SI/HI and denies hallucinations. Pt complaint of constipation, she said her last bowel moment was on Tues and she is worried of having impaction; "I don't want to have to go across the street to have that taken care". Pt received laxative earlier per report, but she has no result. Writer to notify physician on call to receive order for a stronger laxative for patient. Q 15 minute check continues to maintain safety.  2230: Pt received bedtime medications without any difficulty. Writer offered suppository for constipations; she refused and stated ;"It won't work , need to go across the street". Writer again offered PRN Fleet enema; Pt agreed to try enema. She received and  tolerates enema. Pt have result and reports feeling relieved.

## 2011-06-29 NOTE — Progress Notes (Signed)
[facility] MD Progress Note  06/29/2011 2:30 PM  Diagnosis:  Axis I: Schizoaffective Disorder - Bipolar Type.  Posttraumatic Stress Disorder.   The patient was seen today and reports the following:   ADL's: Intact.  Sleep: The patient reports to sleeping very well last night without difficulty.  Appetite: The patient report a good appetite today.  Mild>(1-10) >Severe  Hopelessness (1-10): 5  Depression (1-10): 10  Anxiety (1-10): 2-3   Suicidal Ideation: The patient denies any suicidal ideations today.  Plan: No  Intent: No  Means: No   Homicidal Ideation: The patient adamantly denies any homicidal ideations today.  Plan: No  Intent: No.  Means: No   General Appearance /Behavior: Casual and cooperative.  Eye Contact: Good.  Speech: Appropriate in rate and volume.  Motor Behavior: Appropriate.  Level of Consciousness: Alert and Oriented x 3.  Mental Status: Alert and Oriented x 3.  Mood: Severely Depressed.  Affect: Essentially Flat.  Anxiety Level: Mild Anxiety Noted.  Thought Process: WNL.  Thought Content: The patient denies any current auditory or visual hallucinations or delusional thinking today.  Perception:. wnl.  Judgment: Fair to Good.  Insight: Fair to Good.  Cognition: Orientated to time, place and person.  Sleep:  Number of Hours: 6.75    Vital Signs:Blood pressure 111/75, pulse 105, temperature 98 F (36.7 C), temperature source Oral, resp. rate 17, height 5\' 9"  (1.753 m), weight 169.645 kg (374 lb). Current Medications: Current Facility-Administered Medications  Medication Dose Route Frequency Provider Last Rate Last Dose  . acetaminophen (TYLENOL) tablet 650 mg  650 mg Oral Q6H PRN Franchot Gallo, MD   650 mg at 06/28/11 1300  . alum & mag hydroxide-simeth (MAALOX/MYLANTA) 200-200-20 MG/5ML suspension 30 mL  30 mL Oral Q4H PRN Franchot Gallo, MD      . ARIPiprazole (ABILIFY) tablet 10 mg  10 mg Oral Daily Franchot Gallo, MD   10 mg at 06/29/11 0806  .  aspirin EC tablet 81 mg  81 mg Oral Daily Franchot Gallo, MD   81 mg at 06/29/11 0806  . butalbital-acetaminophen-caffeine (FIORICET, ESGIC) 50-325-40 MG per tablet 1 tablet  1 tablet Oral NOW Franchot Gallo, MD   1 tablet at 06/28/11 1818  . butalbital-acetaminophen-caffeine (FIORICET, ESGIC) 50-325-40 MG per tablet 1 tablet  1 tablet Oral Q8H PRN Franchot Gallo, MD   1 tablet at 06/29/11 0944  . cholecalciferol (VITAMIN D) tablet 2,000 Units  2,000 Units Oral Daily Franchot Gallo, MD   2,000 Units at 06/29/11 0806  . diazepam (VALIUM) tablet 5 mg  5 mg Oral QHS Franchot Gallo, MD   5 mg at 06/28/11 2155  . divalproex (DEPAKOTE ER) 24 hr tablet 1,000 mg  1,000 mg Oral QHS Franchot Gallo, MD   1,000 mg at 06/28/11 2155  . DULoxetine (CYMBALTA) DR capsule 30 mg  30 mg Oral Daily Franchot Gallo, MD   30 mg at 06/29/11 0808  . furosemide (LASIX) tablet 40 mg  40 mg Oral Q48H Randy Readling, MD   40 mg at 06/29/11 0807   And  . furosemide (LASIX) tablet 20 mg  20 mg Oral Q48H Randy Readling, MD   20 mg at 06/28/11 1045  . gabapentin (NEURONTIN) capsule 300 mg  300 mg Oral Q24H Franchot Gallo, MD   300 mg at 06/29/11 0808  . gabapentin (NEURONTIN) tablet 600 mg  600 mg Oral Q1400 Franchot Gallo, MD   600 mg at 06/29/11 1413  . gabapentin (NEURONTIN) tablet 600 mg  600 mg Oral QHS Franchot Gallo, MD   600 mg at 06/28/11 2156  . Glycerin (Adult) 2.1 G suppository 1 suppository  1 suppository Rectal BID PRN Viviann Spare, NP      . levothyroxine (SYNTHROID, LEVOTHROID) tablet 125 mcg  125 mcg Oral QHS Franchot Gallo, MD   125 mcg at 06/28/11 2155  . lubiprostone (AMITIZA) capsule 24 mcg  24 mcg Oral BID WC Franchot Gallo, MD   24 mcg at 06/29/11 0808  . magnesium hydroxide (MILK OF MAGNESIA) suspension 30 mL  30 mL Oral Daily PRN Franchot Gallo, MD      . meloxicam (MOBIC) tablet 15 mg  15 mg Oral QPM Franchot Gallo, MD   15 mg at 06/28/11 1705  . methocarbamol (ROBAXIN) tablet 750 mg  750 mg Oral Daily  Franchot Gallo, MD   750 mg at 06/29/11 0809  . mulitivitamin with minerals tablet 1 tablet  1 tablet Oral Daily Franchot Gallo, MD   1 tablet at 06/29/11 0808  . niacin (SLO-NIACIN) CR tablet 500 mg  500 mg Oral QHS Franchot Gallo, MD   500 mg at 06/28/11 2156  . simvastatin (ZOCOR) tablet 20 mg  20 mg Oral Daily Franchot Gallo, MD   20 mg at 06/29/11 0809  . sodium phosphate (FLEET) 7-19 GM/118ML enema 1 enema  1 enema Rectal BID PRN Viviann Spare, NP      . SUMAtriptan (IMITREX) tablet 50 mg  50 mg Oral Q2H PRN Viviann Spare, NP   50 mg at 06/29/11 0916  . verapamil (CALAN-SR) CR tablet 180 mg  180 mg Oral Q breakfast Franchot Gallo, MD   180 mg at 06/29/11 1610   Lab Results:  Results for orders placed during the hospital encounter of 06/28/11 (from the past 48 hour(s))  TSH     Status: Normal   Collection Time   06/28/11  7:50 PM      Component Value Range Comment   TSH 1.997  0.350 - 4.500 (uIU/mL)   T3, FREE     Status: Normal   Collection Time   06/28/11  7:50 PM      Component Value Range Comment   T3, Free 3.0  2.3 - 4.2 (pg/mL)   T4, FREE     Status: Normal   Collection Time   06/28/11  7:50 PM      Component Value Range Comment   Free T4 1.11  0.80 - 1.80 (ng/dL)   VALPROIC ACID LEVEL     Status: Normal   Collection Time   06/28/11  7:50 PM      Component Value Range Comment   Valproic Acid Lvl 57.2  50.0 - 100.0 (ug/mL)   PLATELET COUNT     Status: Normal   Collection Time   06/28/11  7:50 PM      Component Value Range Comment   Platelets 154  150 - 400 (K/uL)    Time was spent today discussing with the patient her current symptoms.  The patient states that while her depression is a "10" she does feel this is improving.  She does report concerns about constipation and the Midlevel provider will address this.  Overall she is pleased with her current treatment.  Treatment Plan Summary:  1. Daily contact with patient to assess and evaluate symptoms and progress in  treatment  2. Medication management  3. The patient will deny suicidal ideations or homicidal ideations for 48 hours prior to discharge and  have a depression and anxiety rating of 3 or less. The patient will also deny any auditory or visual hallucinations or delusional thinking.   Plan:  1. Will increase Cymbalta to 60 mgs po q am for depression and pain.  2. Will continue other medications.  3. Will continue to monitor.  4. Midlevel to address constipation.  Randy Readling 06/29/2011, 2:30 PM

## 2011-06-29 NOTE — Progress Notes (Signed)
Pt has been up and has been active and has been attending and participating in various milieu activities throughout the day today, did endorse having feelings of depression, denied any suicidal thoughts, pt has complained of constipation today and has received medications to help, support provided, will continue to monitor

## 2011-06-29 NOTE — Progress Notes (Signed)
Patient ID: Allison Sharp, female   DOB: 07/29/1955, 56 y.o.   MRN: [id number]  Past admission records received from [facility] from 2007 and placed in the shadow record.

## 2011-06-30 MED ORDER — GATORADE (BH)
240.0000 mL | Freq: Three times a day (TID) | ORAL | Status: DC | PRN
  Filled 2011-06-30: qty 480

## 2011-06-30 MED ORDER — MAGNESIUM CITRATE PO SOLN
1.0000 | Freq: Once | ORAL | Status: AC
  Administered 2011-06-30: 1 via ORAL

## 2011-06-30 MED ORDER — POLYETHYLENE GLYCOL 3350 17 G PO PACK
17.0000 g | PACK | Freq: Every day | ORAL | Status: DC
  Administered 2011-06-30: 17 g via ORAL
  Filled 2011-06-30 (×3): qty 1

## 2011-06-30 NOTE — Progress Notes (Signed)
[facility] Group Notes:  (Counselor/Nursing/MHT/Case Management/Adjunct)  06/30/2011 8:18 AM  Type of Therapy:  Music Therapy 06/29/11  Participation Level:  Active  Participation Quality:  Appropriate  Affect:  Depressed  Cognitive:  Appropriate  Insight:  Good  Engagement in Group:  Good  Engagement in Therapy:  Good  Modes of Intervention:  Activity, Support and Music  Summary of Progress/Problems: Patient participated actively in music listening and relaxation activities. Talked about how she had used music in the past to relieve some of her back pain. She was able to express feelings related to sadness however music made her feel at peace.   Allison Sharp, [name] 06/30/2011, 8:18 AM

## 2011-06-30 NOTE — Progress Notes (Signed)
Pt quiet but attending groups with good participation.  Rates depression at 8 and hopelessness at 5.  No physical complaints noted on Patient inventory.  Denies SI.  Reports poor sleep but good appetite.  Extremely focused on bowels and redirection given. Reports greater that 3 stools this shift.  Energy reported as low.  Support offered. Compliant with CPAP during sleep time.  15' checks cont for safety.

## 2011-06-30 NOTE — Progress Notes (Signed)
[facility] MD Progress Note  06/30/2011 11:43 AM  Diagnosis:  Axis I: Schizoaffective Disorder - Bipolar Type.  Posttraumatic Stress Disorder.   The patient was seen today and reports the following:   ADL's: Intact.  Sleep: The patient reports to having some difficulty sleeping last night secondary to worrying about her constipation.  Appetite: The patient report a good appetite today.   Mild>(1-10) >Severe  Hopelessness (1-10): 5  Depression (1-10): 8  Anxiety (1-10): 5 Pain (1-10):  6 (From reported constipation).   Suicidal Ideation: The patient adamantly denies any suicidal ideations today.  Plan: No  Intent: No  Means: No   Homicidal Ideation: The patient adamantly denies any homicidal ideations today.  Plan: No  Intent: No.  Means: No   General Appearance /Behavior: Casual and cooperative.  Eye Contact: Good.  Speech: Appropriate in rate and volume.  Motor Behavior: Appropriate.  Level of Consciousness: Alert and Oriented x 3.  Mental Status: Alert and Oriented x 3.  Mood: Severely Depressed.  Affect: Remains Flat.  Anxiety Level: Moderate Anxiety Noted.  Thought Process: WNL.  Thought Content: The patient denies any current auditory or visual hallucinations or delusional thinking today.  Perception:. wnl.  Judgment: Fair to Good.  Insight: Fair to Good.  Cognition: Orientated to time, place and person.  Sleep:  Number of Hours: 5.75    Vital Signs:Blood pressure 104/57, pulse 82, temperature 98.3 F (36.8 C), temperature source Oral, resp. rate 18, height 5\' 9"  (1.753 m), weight 169.645 kg (374 lb). Current Medications: Current Facility-Administered Medications  Medication Dose Route Frequency Provider Last Rate Last Dose  . acetaminophen (TYLENOL) tablet 650 mg  650 mg Oral Q6H PRN Franchot Gallo, MD   650 mg at 06/28/11 1300  . alum & mag hydroxide-simeth (MAALOX/MYLANTA) 200-200-20 MG/5ML suspension 30 mL  30 mL Oral Q4H PRN Franchot Gallo, MD      .  ARIPiprazole (ABILIFY) tablet 10 mg  10 mg Oral Daily Franchot Gallo, MD   10 mg at 06/30/11 0827  . aspirin EC tablet 81 mg  81 mg Oral Daily Franchot Gallo, MD   81 mg at 06/30/11 1610  . bisacodyl (DULCOLAX) EC tablet 15 mg  15 mg Oral Daily PRN Viviann Spare, NP   15 mg at 06/29/11 1812  . butalbital-acetaminophen-caffeine (FIORICET, ESGIC) 50-325-40 MG per tablet 1 tablet  1 tablet Oral Q8H PRN Franchot Gallo, MD   1 tablet at 06/29/11 0944  . cholecalciferol (VITAMIN D) tablet 2,000 Units  2,000 Units Oral Daily Franchot Gallo, MD   2,000 Units at 06/30/11 [phone]  . diazepam (VALIUM) tablet 5 mg  5 mg Oral QHS Franchot Gallo, MD   5 mg at 06/29/11 2149  . divalproex (DEPAKOTE ER) 24 hr tablet 1,000 mg  1,000 mg Oral QHS Franchot Gallo, MD   1,000 mg at 06/29/11 2149  . DULoxetine (CYMBALTA) DR capsule 60 mg  60 mg Oral Daily Franchot Gallo, MD   60 mg at 06/30/11 0828  . furosemide (LASIX) tablet 40 mg  40 mg Oral Q48H Viaan Knippenberg, MD   40 mg at 06/29/11 0807   And  . furosemide (LASIX) tablet 20 mg  20 mg Oral Q48H Dmario Russom, MD   20 mg at 06/30/11 0829  . gabapentin (NEURONTIN) capsule 300 mg  300 mg Oral Q24H Franchot Gallo, MD   300 mg at 06/30/11 0827  . gabapentin (NEURONTIN) tablet 600 mg  600 mg Oral Q1400 Franchot Gallo, MD  600 mg at 06/29/11 1413  . gabapentin (NEURONTIN) tablet 600 mg  600 mg Oral QHS Franchot Gallo, MD   600 mg at 06/29/11 2151  . Glycerin (Adult) 2.1 G suppository 1 suppository  1 suppository Rectal BID PRN Viviann Spare, NP   1 suppository at 06/29/11 1445  . levothyroxine (SYNTHROID, LEVOTHROID) tablet 125 mcg  125 mcg Oral QHS Franchot Gallo, MD   125 mcg at 06/29/11 2150  . lubiprostone (AMITIZA) capsule 24 mcg  24 mcg Oral BID WC Franchot Gallo, MD   24 mcg at 06/30/11 0827  . magnesium citrate solution 1 Bottle  1 Bottle Oral Once Franchot Gallo, MD   1 Bottle at 06/30/11 0931  . magnesium hydroxide (MILK OF MAGNESIA) suspension 30 mL  30 mL  Oral Daily PRN Franchot Gallo, MD      . meloxicam (MOBIC) tablet 15 mg  15 mg Oral QPM Franchot Gallo, MD   15 mg at 06/29/11 1712  . methocarbamol (ROBAXIN) tablet 750 mg  750 mg Oral Daily Franchot Gallo, MD   750 mg at 06/30/11 0827  . mulitivitamin with minerals tablet 1 tablet  1 tablet Oral Daily Franchot Gallo, MD   1 tablet at 06/30/11 0827  . niacin (SLO-NIACIN) CR tablet 500 mg  500 mg Oral QHS Franchot Gallo, MD   500 mg at 06/29/11 2151  . simvastatin (ZOCOR) tablet 20 mg  20 mg Oral Daily Franchot Gallo, MD   20 mg at 06/30/11 0829  . sodium phosphate (FLEET) 7-19 GM/118ML enema 1 enema  1 enema Rectal BID PRN Viviann Spare, NP   1 enema at 06/29/11 2220  . SUMAtriptan (IMITREX) tablet 50 mg  50 mg Oral Q2H PRN Viviann Spare, NP   50 mg at 06/29/11 0916  . verapamil (CALAN-SR) CR tablet 180 mg  180 mg Oral Q breakfast Franchot Gallo, MD   180 mg at 06/30/11 0827  . DISCONTD: DULoxetine (CYMBALTA) DR capsule 30 mg  30 mg Oral Daily Franchot Gallo, MD   30 mg at 06/29/11 0808   Lab Results:  Results for orders placed during the hospital encounter of 06/28/11 (from the past 48 hour(s))  TSH     Status: Normal   Collection Time   06/28/11  7:50 PM      Component Value Range Comment   TSH 1.997  0.350 - 4.500 (uIU/mL)   T3, FREE     Status: Normal   Collection Time   06/28/11  7:50 PM      Component Value Range Comment   T3, Free 3.0  2.3 - 4.2 (pg/mL)   T4, FREE     Status: Normal   Collection Time   06/28/11  7:50 PM      Component Value Range Comment   Free T4 1.11  0.80 - 1.80 (ng/dL)   VALPROIC ACID LEVEL     Status: Normal   Collection Time   06/28/11  7:50 PM      Component Value Range Comment   Valproic Acid Lvl 57.2  50.0 - 100.0 (ug/mL)   PLATELET COUNT     Status: Normal   Collection Time   06/28/11  7:50 PM      Component Value Range Comment   Platelets 154  150 - 400 (K/uL)    Time was spent today discussing with the patient her concerns about  constipation.  According to Nursing, the patient had a large BM yesterday after a fleets enema.  The patient now reports that she continues to be constipated and needs additional medications.  She reports to drinking miralax mixed with Gatorade thruout the day at her home.  Treatment Plan Summary:  1. Daily contact with patient to assess and evaluate symptoms and progress in treatment  2. Medication management  3. The patient will deny suicidal ideations or homicidal ideations for 48 hours prior to discharge and have a depression and anxiety rating of 3 or less. The patient will also deny any auditory or visual hallucinations or delusional thinking.   Plan:  1. Will continue current medications.  2. Will give Mag Citrate 1 bottle po now for reported constipation. 3. Will order Miralax 17 grams po qhs with Gatorade for constipation. 4. Will continue to monitor.    Kydan Shanholtzer 06/30/2011, 11:43 AM
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
## 2011-06-30 NOTE — Discharge Planning (Signed)
During Aftercare Planning Group, Case Manager provided psychoeducation on "Suicide Prevention Information."  This included descriptions of risk factors for suicide, warning signs that an individual is in crisis and thinking of suicide, and what to do if this occurs.  Pt indicated understanding of information provided, and will read brochure given upon discharge.     Patient more depressed today, did not sleep well last night.  No case management needs today.  Ambrose Mantle, LCSW 06/30/2011, 11:54 AM

## 2011-06-30 NOTE — Progress Notes (Signed)
[facility] Group Notes:  (Counselor/Nursing/MHT/Case Management/Adjunct)  06/30/2011 12:46 PM  Type of Therapy:  Group Therapy   Participation Level:  Active  Participation Quality:  Appropriate  Affect:  Depressed  Cognitive:  Appropriate  Insight:  Good  Engagement in Group:  Good  Engagement in Therapy:  Good  Modes of Intervention:  Education, Problem-solving, Support and Exploration  Summary of Progress/Problems: Patient was active in discussion of expression of anger. She stated that it takes a lot to make her mad. She also holds everything and then she kind of blows up. Receptive to other ways of managing anger,   Antonea Gaut, [name] 06/30/2011, 12:46 PM

## 2011-07-01 MED ORDER — POLYETHYLENE GLYCOL 3350 17 G PO PACK
17.0000 g | PACK | Freq: Every day | ORAL | Status: DC
  Administered 2011-07-01 – 2011-07-03 (×3): 17 g via ORAL
  Filled 2011-07-01 (×3): qty 1

## 2011-07-01 NOTE — Progress Notes (Signed)
  Allison Sharp is a 56 y.o. female [id number] 05/26/1956  06/28/2011 Principal Problem:  *Schizoaffective disorder, bipolar type Active Problems:  PTSD (post-traumatic stress disorder)   Mental Status: alert & oriented says she is anxious wants to know why she has been sleeping all day.  Subjective/Objective: Had her CPAP on  Was passing gas and asks for more constipation meds.Said she had some reults from yesterday but needs some more help.     Filed Vitals:   07/01/11 0814  BP: 128/77  Pulse: 83  Temp:   Resp:     Lab Results:   BMET    Component Value Date/Time   NA 139 06/27/2011 1525   K 4.2 06/27/2011 1525   CL 101 06/27/2011 1525   CO2 29 06/27/2011 1525   GLUCOSE 93 06/27/2011 1525   BUN 17 06/27/2011 1525   CREATININE 0.70 06/27/2011 1525   CREATININE 0.67 08/26/2010 1635   CALCIUM 9.7 06/27/2011 1525   GFRNONAA >90 06/27/2011 1525   GFRAA >90 06/27/2011 1525    Medications:  Scheduled:     . ARIPiprazole  10 mg Oral Daily  . aspirin EC  81 mg Oral Daily  . cholecalciferol  2,000 Units Oral Daily  . diazepam  5 mg Oral QHS  . divalproex  1,000 mg Oral QHS  . DULoxetine  60 mg Oral Daily  . furosemide  40 mg Oral Q48H   And  . furosemide  20 mg Oral Q48H  . gabapentin  300 mg Oral Q24H  . gabapentin  600 mg Oral Q1400  . gabapentin  600 mg Oral QHS  . levothyroxine  125 mcg Oral QHS  . lubiprostone  24 mcg Oral BID WC  . meloxicam  15 mg Oral QPM  . methocarbamol  750 mg Oral Daily  . mulitivitamin with minerals  1 tablet Oral Daily  . niacin  500 mg Oral QHS  . polyethylene glycol  17 g Oral QHS  . simvastatin  20 mg Oral Daily  . verapamil  180 mg Oral Q breakfast     PRN Meds acetaminophen, alum & mag hydroxide-simeth, bisacodyl, butalbital-acetaminophen-caffeine, gatorade (BH), Glycerin (Adult), magnesium hydroxide, sodium phosphate, SUMAtriptan  Can repeat Miralax today.  Continue with rest of plan  of care. Dejai Schubach,MICKIE D. 07/01/2011

## 2011-07-01 NOTE — Progress Notes (Signed)
Patient ID: VLASTA BASKIN, female   DOB: [birth year], 56 y.o.   MRN: [id number]  [facility] Group Notes:  (Counselor/Nursing/MHT/Case Management/Adjunct)  07/01/2011 11 AM  Type of Therapy:  Group Therapy, Dance/Movement Therapy   Participation Level:  Active  Participation Quality:  Appropriate  Affect:  Appropriate  Cognitive:  Oriented  Insight:  Limited  Engagement in Group:  Good  Engagement in Therapy:  Good  Modes of Intervention:  Clarification, Problem-solving, Role-play, Socialization and Support  Summary of Progress/Problems:  Group enacted activities they enjoy to release physical tension as well as to embody tasks to use when feeling stuck. Pt shared that she likes to cook and sew. She showed no resistance to participate and was able to fully open her arms. While she may need to become aware of safety in taking risks to try new coping skills, she showed that she is willing to help herself in her recovery.   Thomasena Edis, [employer]

## 2011-07-01 NOTE — Progress Notes (Signed)
Patient ID: Allison Sharp, female   DOB: 1955-11-28, 56 y.o.   MRN: [id number] The patient spent most of the evening resting in bed. Continues to obsess about her bowels, requesting a laxative. Was pleased to confirm that she would be started on Miralax this evening. Denies any suicidal ideation. Denies any auditory hallucinations. Compliant with medication.

## 2011-07-01 NOTE — Progress Notes (Signed)
Pt is depressed and has a sad look on her face   She interacts appropriately with others and is pleasant on approach   She denies suicidal and homicidal ideation and denies auditory and visual hallucinations   She attends and participates in group activities  Pt has an unsteady gait and walks with a cane  Verbal support given  Medications administered and effectiveness monitored  Q 15 min checks   Pt is safe at present

## 2011-07-02 MED ORDER — BISACODYL 5 MG PO TBEC
5.0000 mg | DELAYED_RELEASE_TABLET | Freq: Three times a day (TID) | ORAL | Status: DC
  Administered 2011-07-02 – 2011-07-03 (×4): 5 mg via ORAL
  Filled 2011-07-02 (×6): qty 1

## 2011-07-02 MED ORDER — NIACIN ER 500 MG PO CPCR
500.0000 mg | ORAL_CAPSULE | Freq: Every day | ORAL | Status: DC
  Administered 2011-07-02: 500 mg via ORAL
  Filled 2011-07-02 (×2): qty 1

## 2011-07-02 NOTE — Progress Notes (Addendum)
  AVALON COPPINGER is a 56 y.o. female [id number] [birth year]  06/28/2011 Principal Problem:  *Schizoaffective disorder, bipolar type Active Problems:  PTSD (post-traumatic stress disorder)   Mental Status: Alert & oriented mood and affect brighter denies SI/HI/AVH.  Subjective/Objective: Wants the Miralax TID and is hoping for discharge tomorrow. Says the Cymbalta is helping but constipates her.    Filed Vitals:   07/02/11 0819  BP: 129/86  Pulse: 102  Temp:   Resp:     Lab Results:   BMET    Component Value Date/Time   NA 139 06/27/2011 1525   K 4.2 06/27/2011 1525   CL 101 06/27/2011 1525   CO2 29 06/27/2011 1525   GLUCOSE 93 06/27/2011 1525   BUN 17 06/27/2011 1525   CREATININE 0.70 06/27/2011 1525   CREATININE 0.67 08/26/2010 1635   CALCIUM 9.7 06/27/2011 1525   GFRNONAA >90 06/27/2011 1525   GFRAA >90 06/27/2011 1525    Medications:  Scheduled:     . ARIPiprazole  10 mg Oral Daily  . aspirin EC  81 mg Oral Daily  . cholecalciferol  2,000 Units Oral Daily  . diazepam  5 mg Oral QHS  . divalproex  1,000 mg Oral QHS  . DULoxetine  60 mg Oral Daily  . furosemide  40 mg Oral Q48H   And  . furosemide  20 mg Oral Q48H  . gabapentin  300 mg Oral Q24H  . gabapentin  600 mg Oral Q1400  . gabapentin  600 mg Oral QHS  . levothyroxine  125 mcg Oral QHS  . lubiprostone  24 mcg Oral BID WC  . meloxicam  15 mg Oral QPM  . methocarbamol  750 mg Oral Daily  . mulitivitamin with minerals  1 tablet Oral Daily  . niacin  500 mg Oral QHS  . polyethylene glycol  17 g Oral Daily  . simvastatin  20 mg Oral Daily  . verapamil  180 mg Oral Q breakfast  . DISCONTD: niacin  500 mg Oral QHS  . DISCONTD: polyethylene glycol  17 g Oral QHS     PRN Meds acetaminophen, alum & mag hydroxide-simeth, bisacodyl, butalbital-acetaminophen-caffeine, gatorade (BH), Glycerin (Adult), magnesium hydroxide, sodium phosphate, SUMAtriptan  Plan: Continue current plan. Will add Dulcolax.                              Amena Dockham,MICKIE D. 07/02/2011

## 2011-07-02 NOTE — Progress Notes (Signed)
Pt is depressed and has a tense look on her face   She said she did not sleep well last night  She did use her cpap machine   She attends and participates in groups  And interacts appropriately with others   She continues to say she has not had a bowel movement  Although she is getting laxatives   She also said her energy level is very low   Verbal support given  Medications administered and effectiveness monitored   Q 15 min checks   Pt safe at present

## 2011-07-02 NOTE — Progress Notes (Signed)
Patient ID: Allison Sharp, female   DOB: 1956/05/01, 55 y.o.   MRN: [id number] The patient was in her room resting in bed with her eyes closed all evening. C-pap in place. Did not attend evening group. Had to be awakened for HS meds. Stated that she was tired this evening and needed to rest.

## 2011-07-02 NOTE — Progress Notes (Signed)
Patient ID: Allison Sharp, female   DOB: 10/23/1955, 56 y.o.   MRN: [id number]  [facility] Group Notes:  (Counselor/Nursing/MHT/Case Management/Adjunct)  07/02/2011 11 AM  Type of Therapy:  Group Therapy, Dance/Movement Therapy   Participation Level:  Active  Participation Quality:  Appropriate  Affect:  Appropriate  Cognitive:  Appropriate  Insight:  Good  Engagement in Group:  Good  Engagement in Therapy:  Good  Modes of Intervention:  Clarification, Problem-solving, Role-play, Socialization and Support  Summary of Progress/Problems: Group focused on what makes a external support healthy or unhealthy. The group then practiced how to show others the ways in which they need to be supported, as well as how to give themselves support. Pt reported that she was feeling partially awake and sleepy. Pt shared good insight about the group process and was creative when she was the leader. This suggests that pt is capable of being active in creating an effective recovery plan.   Thomasena Edis, Jezabella Schriever

## 2011-07-03 MED ORDER — DIAZEPAM 5 MG PO TABS: 5.0000 mg | ORAL_TABLET | Freq: Every day | ORAL | Status: DC

## 2011-07-03 MED ORDER — GABAPENTIN 300 MG PO CAPS: 300.0000 mg | ORAL_CAPSULE | ORAL | Status: DC

## 2011-07-03 MED ORDER — ONE-DAILY MULTI VITAMINS PO TABS: 1.0000 | ORAL_TABLET | Freq: Every day | ORAL | Status: DC

## 2011-07-03 MED ORDER — LUBIPROSTONE 24 MCG PO CAPS: 24.0000 ug | ORAL_CAPSULE | Freq: Two times a day (BID) | ORAL | Status: AC

## 2011-07-03 MED ORDER — DULOXETINE HCL 60 MG PO CPEP: 60.0000 mg | ORAL_CAPSULE | Freq: Every day | ORAL | Status: DC

## 2011-07-03 MED ORDER — POLYETHYLENE GLYCOL 3350 17 G PO PACK: 17.0000 g | PACK | Freq: Every day | ORAL | Status: AC

## 2011-07-03 MED ORDER — BUTALBITAL-APAP-CAFFEINE 50-325-40 MG PO TABS: ORAL_TABLET | ORAL | Status: DC

## 2011-07-03 MED ORDER — DIVALPROEX SODIUM ER 500 MG PO TB24: 1000.0000 mg | ORAL_TABLET | Freq: Every day | ORAL | Status: DC

## 2011-07-03 MED ORDER — OMEPRAZOLE 20 MG PO CPDR: 20.0000 mg | DELAYED_RELEASE_CAPSULE | Freq: Every day | ORAL | Status: DC

## 2011-07-03 MED ORDER — ARIPIPRAZOLE 10 MG PO TABS: 10.0000 mg | ORAL_TABLET | Freq: Every day | ORAL | Status: DC

## 2011-07-03 NOTE — Progress Notes (Addendum)
Patient ID: Allison Sharp, female   DOB: [birth year], 56 y.o.   MRN: [id number] Showered this evening and came out to med window for her meds. Seems focused on her bowels and requested miralax tonight, in addition to dulcolax. stating her pain meds were constipating.  Reported last bm was yesterday. Was offered mom instead as miralax ordered for am.  Pleasant, but rather flat affect,quiet. Legs remain swollen and uncomfortable, walking with cane.  Was in dayroom for snack,  Little interaction noted with peers or staff.  Will continue to monitor.

## 2011-07-03 NOTE — [facility] Suicide Risk Assessment (Signed)
Suicide Risk Assessment  Discharge Assessment     Demographic factors:  Assessment Details Time of Assessment: Admission Information Obtained From: Patient Current Mental Status:  Current Mental Status:  (Denies SI) Risk Reduction Factors:  Risk Reduction Factors: Sense of responsibility to family;Religious beliefs about death;Positive therapeutic relationship  CLINICAL FACTORS:   Depression:   Anhedonia Insomnia More than one psychiatric diagnosis Previous Psychiatric Diagnoses and Treatments Medical Diagnoses and Treatments/Surgeries  COGNITIVE FEATURES THAT CONTRIBUTE TO RISK:  None Noted.    Diagnosis:  Axis I: Schizoaffective Disorder - Bipolar Type.  Posttraumatic Stress Disorder.   The patient was seen today and reports the following:   ADL's: Intact.  Sleep: The patient reports to having some difficulty sleeping but states this is much improved since admission. Appetite: The patient report a good appetite today.   Mild>(1-10) >Severe  Hopelessness (1-10): 0  Depression (1-10): 5  Anxiety (1-10): 0   Suicidal Ideation: The patient adamantly denies any suicidal ideations today.  Plan: No  Intent: No  Means: No   Homicidal Ideation: The patient adamantly denies any homicidal ideations today.  Plan: No  Intent: No.  Means: No   General Appearance /Behavior: Casual and cooperative.  Eye Contact: Good.  Speech: Appropriate in rate and volume.  Motor Behavior: Appropriate.  Level of Consciousness: Alert and Oriented x 3.  Mental Status: Alert and Oriented x 3.  Mood: Mild to Moderately Depressed. (States she will be much less depressed once she is home). Affect: Mildly Constricted..  Anxiety Level: No Anxiety Reported or Noted.  Thought Process: WNL.  Thought Content: The patient denies any auditory or visual hallucinations or delusional thinking today.  Perception:. wnl.  Judgment: Good.  Insight: Good.  Cognition: Orientated to time, place and person.    Time was spent today discussing with the patient her desire for discharge.  The patient states that she is feeling much better but now is somewhat depressed because she misses her dog.   She plans to live with her sister for a few days after discharge and then return to live with her brother.  She also has scheduled follow ups both with her Psychiatrist and her therapist at the end of this week.  Treatment Plan Summary:  1. Daily contact with patient to assess and evaluate symptoms and progress in treatment  2. Medication management  3. The patient will deny suicidal ideations or homicidal ideations for 48 hours prior to discharge and have a depression and anxiety rating of 3 or less. The patient will also deny any auditory or visual hallucinations or delusional thinking.   Plan:  1. Will continue current medications.  2. Will increase Miralax to 17 grams po TID with Gatorade for constipation.  3. Will continue to monitor.  4. Discharge today to outpatient follow up.  SUICIDE RISK:   Minimal: No identifiable suicidal ideation.  Patients presenting with no risk factors but with morbid ruminations; may be classified as minimal risk based on the severity of the depressive symptoms  Allison Sharp 07/03/2011, 12:25 PM

## 2011-07-03 NOTE — Progress Notes (Signed)
Pt was discharged home today.  She denied any S/I H/I or A/V hallucinations.    She was given f/u appointment, rx, sample medications, hotline info booklet, and information on grief counseling.  She voiced understanding to all instructions provided.  She declined the need for smoking cessation materials.

## 2011-07-03 NOTE — Progress Notes (Signed)
[facility] Case Management Discharge Plan:  Will you be returning to the same living situation after discharge: Yes,  will return to her home with nephew, but first will stay with sister for a few days At discharge, do you have transportation home?:Yes,  sister will pick up Do you have the ability to pay for your medications:Yes,  insurance and income  Interagency Information:     Release of information consent forms completed and in the chart;  Patient's signature needed at discharge.  Patient to Follow up at:  Follow-up Information    Follow up with Dr. Lolly Mustache on 07/06/2011. (11:30AM appointment; please arrive at 11:15AM)    Contact information:   Jess Barters [address]Alison Murray  [phone]      Follow up with Dr. Talmadge Chad on 07/07/2011. (1:45PM appointment)    Contact information:   [facility] [phone] [phone]      Follow up with [facility]. (Go to Support Groups as outlined in materials given to you at discharge)          Patient denies SI/HI:   Yes,      Safety Planning and Suicide Prevention discussed:  Yes,  During various groups, Case Managers have provided psychoeducation on "Suicide Prevention Information."  This included descriptions of risk factors for suicide, warning signs that an individual is in crisis and thinking of suicide, and what to do if this occurs.  Pt indicated understanding of information provided, and will read brochure given upon discharge.     Barrier to discharge identified:No.  Summary and Recommendations:  Go to medication management, counseling, and add grief support groups.   Sarina Ser 07/03/2011, 12:51 PM

## 2011-07-03 NOTE — Progress Notes (Signed)
Recreation Therapy Notes  07/03/2011         Time: 0930      Group Topic/Focus: The focus of this group is on enhancing the patient's understanding of leisure, barriers to leisure, and the importance of engaging in positive leisure activities upon discharge for improved total health.  Participation Level: Active  Participation Quality: Appropriate and Attentive  Affect: Appropriate  Cognitive: Oriented   Additional Comments: None.   [name] 07/03/2011 10:04 AM

## 2011-07-03 NOTE — Tx Team (Signed)
Interdisciplinary Treatment Plan Update (Adult)  Date:  07/03/2011  Time Reviewed:  10:15AM-11:00AM  Progress in Treatment: Attending groups:  Yes Participating in groups:    Yes Taking medication as prescribed:    Yes, no refusals Tolerating medication:   Yes, no side effects have been noted by staff or reported by patient Family/Significant other contact made:  Yes Patient understands diagnosis:   Yes Discussing patient identified problems/goals with staff:   Yes Medical problems stabilized or resolved:   Yes, remains focused on bowels Denies suicidal/homicidal ideation:  Yes Issues/concerns per patient self-inventory:   None Other:  New problem(s) identified: No, Describe:    Reason for Continuation of Hospitalization: None  Interventions implemented related to continuation of hospitalization:  Medication monitoring and adjustment, safety checks Q15 min., suicide risk assessment, group therapy, psychoeducation, collateral contact, aftercare planning, ongoing physician assessments, medication education - until discharge  Additional comments:  Can talk about her friends who died in the fire more now, without crying.  Feels this is a sign her medicine is working.  Estimated length of stay:  D/C Today  Discharge Plan:  Sister will transport home, and will stay with sister for a few days with dog as well, before going home to nephew's house.  Will follow up with Dr. Lolly Mustache, Dr. Kieth Brightly, and with [facility] support groups.  New goal(s):  Not applicable  Review of initial/current patient goals per problem list:   1.  Goal(s):  Reduce depression from 10 at admission to no more than 3 at discharge.  Met:  No  Target date:  By Discharge   As evidenced by:  Depression today is "5" and patient feels strongly that it will quickly decrease to 3 when gets home.  2.  Goal(s):  Reduce anxiety from 10 at admission to no more than 3 at discharge.  Met:  Yes  Target date:   By Discharge   As evidenced by:  "0" today  3.  Goal(s):  Deny SI for 48 hours prior to discharge.  Met:  Yes  Target date:  By Discharge   As evidenced by:  Has denied for awhile.  4.  Goal(s):  Return psychotic symptoms to baseline.  Met:  Yes  Target date:  By Discharge   As evidenced by:  No auditory hallucinations since arrived at hospital  5.  Goal(s): Return sleep and appetite to normal pattern  Met:  Yes  Target date:  By Discharge   As evidenced by:  Both are returned to normal, per patient report  4.  Goal(s):  Start work on [employer] on grief issues.  Met:  No  Target date:  By Discharge   As evidenced by:  Did all her groups on [address] hall, comfortably  Attendees: Patient:  Allison Sharp  07/03/2011 10:30AM  Family:     Physician:  Dr. Harvie Heck Readling 07/03/2011 10:30AM  Nursing:   Robbie Louis, RN 07/03/2011 10:30AM    Case Manager:  Ambrose Mantle, LCSW 07/03/2011 10:30AM  Counselor:  Veto Kemps, MT-BC 07/03/2011 10:30AM  Other:   Lynann Bologna, NP 07/03/2011 10:30AM  Other:      Other:      Other:       Scribe for Treatment Team:   Sarina Ser, 07/03/2011, 11:21 AM

## 2011-07-04 NOTE — Progress Notes (Signed)
Patient Discharge Instructions:  Admission Note Faxed,  07/04/2011 After Visit Summary Faxed,  07/04/2011 Faxed to the Next Level Care provider:  07/04/2011 Facesheet faxed 07/04/2011   Faxed to [facility] - Dr. Lolly Mustache, Dr. Talmadge Chad @ [phone]  Heloise Purpura Eduard Clos, 07/04/2011, 4:24 PM

## 2011-07-05 NOTE — Discharge Summary (Signed)
Physician Discharge Summary Note  Patient:  Allison Sharp is an 56 y.o., female MRN:  [id number] DOB:  1955-12-29 Patient phone:  [phone] (home)  Patient address:   [address] [place] [address],   Date of Admission:  06/28/2011 Date of Discharge: 07/03/2011    Discharge Diagnoses: Principal Problem:  *Schizoaffective disorder, bipolar type Active Problems:  PTSD (post-traumatic stress disorder)   Axis Diagnosis:   AXIS I:  Schizoaffective Disorder, Bipolar type; PTSD AXIS II:  No diagnosis AXIS III:  headache Past Medical History  Diagnosis Date  . Hyperlipidemia   . CAD (coronary artery disease)   . Bipolar 1 disorder   . Schizophrenia   . GERD (gastroesophageal reflux disease)   . Hyperthyroidism   . Chronic back pain   . Morbid obesity   . Dyspnea     PFT 03/05/09 FEV1 2.77(98%), FVC 3.25(86%), FEV1% 85, TLC 5.88(99%), DLCO 60% ,  Methacholine challenge 03/16/09 normal ,  CT chest 03/12/09 no pulmonary disease  . Anxiety   . Arthritis   . Depression   . OSA on CPAP     2 liters  . HTN (hypertension)   . History of colonoscopy 10/17/2002    by Dr Rehman-> distal non-specific proctitis, small ext hemorrhoids,   . Fungal infection   . Cellulitis   . Contusion of sacrum   . Migraine headache   . Vitamin d deficiency   . Sleep apnea   . Complication of anesthesia     States she typically gets sick s/p anesthesia  . Hypothyroidism   . Myocardial infarction     NOV 1997   AXIS IV:  Grief and Loss issues AXIS V:  51-60 moderate symptoms  Level of Care:  OP  Hospital Course:   Rennae presented with an exacerbation of her depression for the previous one month. She been finding herself isolating, hygiene had deteriorated, mood very depressed. She had begun to gain some weight. Sleep deteriorated. On the day of presentation she had developed some paranoia believing that her sister may obtain at her food. She was asking for help with her mood.  She was  admitted for acute stabilization unit and we elected to increase her Abilify from 5 mg daily to 10 mg daily. Wynona Canes was discontinued and we started her on Cymbalta to address not only for depression but some issues with chronic pain. She had a headache for a couple of days which responded well to Fioricet and she was given a prescription for this or discharge. While on our unit her group participation was satisfactory, she was cooperative with peers and staff.  By February 4 she was in full contact with reality for the previous 5 days. She had had no suicidal or homicidal thoughts and felt ready for outpatient treatment.  Consults:  none  Significant Diagnostic Studies:  Valproic Acid 57.2 on current dose.  TSH 1.997; T4 1.11; T3 3.0.  Discharge Vitals:   Blood pressure 130/86, pulse 88, temperature 97.2 F (36.2 C), temperature source Oral, resp. rate 18, height 5\' 9"  (1.753 m), weight 169.645 kg (374 lb).  Mental Status Exam: See Mental Status Examination and Suicide Risk Assessment completed by Attending Physician prior to discharge.  Discharge destination:  Home  Is patient on multiple antipsychotic therapies at discharge:  No   Has Patient had three or more failed trials of antipsychotic monotherapy by history:  No  Recommended Plan for Multiple Antipsychotic Therapies: N/A   Medication List  As  of 07/05/2011 12:01 PM   START taking these medications         butalbital-acetaminophen-caffeine 50-325-40 MG per tablet   Commonly known as: FIORICET, ESGIC   Take 1 tablet every 8 hours as needed for headache.  Do not exceed 2 tablets daily.      DULoxetine 60 MG capsule   Commonly known as: CYMBALTA   Take 1 capsule (60 mg total) by mouth daily. For depression      lubiprostone 24 MCG capsule   Commonly known as: AMITIZA   Take 1 capsule (24 mcg total) by mouth 2 (two) times daily with a meal. For IBS with constipation      polyethylene glycol packet   Commonly known as:  MIRALAX / GLYCOLAX   Take 17 g by mouth daily. For constipation         CHANGE how you take these medications         ARIPiprazole 10 MG tablet   Commonly known as: ABILIFY   Take 1 tablet (10 mg total) by mouth daily. For mood stability and voices.   What changed: - dose - doctor's instructions      diazepam 5 MG tablet   Commonly known as: VALIUM   Take 1 tablet (5 mg total) by mouth at bedtime. For anxiety   What changed: doctor's instructions      divalproex 500 MG 24 hr tablet   Commonly known as: DEPAKOTE ER   Take 2 tablets (1,000 mg total) by mouth at bedtime. For mood stability   What changed: doctor's instructions      gabapentin 300 MG capsule   Commonly known as: NEURONTIN   Take 1-2 capsules (300-600 mg total) by mouth See admin instructions. Take 1 tab in the AM, 2 tabs at noon, 2 tabs at bedtime. For chronic pain and anxiety.   What changed: doctor's instructions      multivitamin tablet   Take 1 tablet by mouth daily. Vitamin supplement.   What changed: doctor's instructions      omeprazole 20 MG capsule   Commonly known as: PRILOSEC   Take 1 capsule (20 mg total) by mouth daily with breakfast. For acid reflux.   What changed: doctor's instructions         CONTINUE taking these medications         aspirin 81 MG EC tablet      atorvastatin 40 MG tablet   Commonly known as: LIPITOR      furosemide 20 MG tablet   Commonly known as: LASIX      levothyroxine 125 MCG tablet   Commonly known as: SYNTHROID, LEVOTHROID      meloxicam 15 MG tablet   Commonly known as: MOBIC      methocarbamol 750 MG tablet   Commonly known as: ROBAXIN      niacin 500 MG CR tablet   Commonly known as: NIASPAN      verapamil 180 MG (CO) 24 hr tablet   Commonly known as: COVERA HS      Vitamin D 2000 UNITS tablet         STOP taking these medications         desvenlafaxine 50 MG 24 hr tablet          Where to get your medications    These are the  prescriptions that you need to pick up.   You may get these medications from any pharmacy.  ARIPiprazole 10 MG tablet   butalbital-acetaminophen-caffeine 50-325-40 MG per tablet   diazepam 5 MG tablet   divalproex 500 MG 24 hr tablet   DULoxetine 60 MG capsule         Information on where to get these meds is not yet available. Ask your nurse or doctor.         gabapentin 300 MG capsule   lubiprostone 24 MCG capsule   multivitamin tablet   omeprazole 20 MG capsule   polyethylene glycol packet           Follow-up Information    Follow up with Dr. Lolly Mustache on 07/06/2011. (11:30AM appointment; please arrive at 11:15AM)    Contact information:   [facility] [address]  [phone]      Follow up with Dr. Talmadge Chad on 07/07/2011. (1:45PM appointment)    Contact information:   [facility] [id number] [phone]      Follow [facility] with [facility]. (Go to Support Groups as outlined in materials given to you at discharge)          Follow-up recommendations:  Activity:  unrestricted Diet:  regular    Signed: Tateanna Bach A 07/05/2011, 12:01 PM

## 2011-07-06 ENCOUNTER — Encounter (HOSPITAL_COMMUNITY): Payer: Self-pay | Admitting: Psychiatry

## 2011-07-06 ENCOUNTER — Ambulatory Visit (INDEPENDENT_AMBULATORY_CARE_PROVIDER_SITE_OTHER): Payer: Medicare Other | Admitting: Psychiatry

## 2011-07-06 ENCOUNTER — Ambulatory Visit (HOSPITAL_COMMUNITY): Payer: Medicare Other | Admitting: Psychiatry

## 2011-07-06 VITALS — Wt 390.0 lb

## 2011-07-06 DIAGNOSIS — F319 Bipolar disorder, unspecified: Secondary | ICD-10-CM

## 2011-07-06 NOTE — Progress Notes (Signed)
Chief complaint I'm doing better on her medication  History of presenting illness Patient is 56 year old Caucasian female who came for her appointment with her sister. [facility] to significant depression having suicidal thinking and hearing voices. Patient told her best friend died in 01/25/23 with her husband when their home burned in fire. Patient told since then she's been more depressed and having paranoia. Patient also told living with her other sister did not help her. She was not paying bills on time and patient is behind her bills and financial distress. At hospital her medications were changed she is taking now Cymbalta and her prestiq was discontinued. She remains on Abilify Depakote and Valium. Patient told he is doing much better with Cymbalta however she has constipation and requires every third day enema to relieve constipation. Patient does not want to go back on prestiq since Cymbalta helping her depression. She denies any agitation anger or mood swings. She is sleeping at least 6 hours every night. She is moved to her nephew and her elder sister is managing her medication. She denies any paranoid thinking or reported any hallucination. She denies any other side effects of medication. Her sister also endorse much improvement with Cymbalta and Abilify.  Past psychiatric history Patient has multiple psychiatric admission. She has at least 3 his suicidal attempt in the past. Her last admission was in January 2013. In the past she had tried Paxil Prozac Wellbutrin Effexor Lexapro amitriptyline however she always had a good response with Cymbalta but it causes constipation. She has been diagnosed with bipolar disorder  Medical history Patient has history of hyperlipidemia, GERD, chronic back pain, obesity, arthritis, hypertension, abstract and sleep apnea, vitamin D deficiency and history of myocardial infarction. Her primary care physician is [facility].  Lab results Her blood work is reviewed. Her Depakote level is 57 which was drawn on January 31.  Family history Patient has family member who has been diagnosed with bipolar disorder  Mental status examination Patient is morbid obese female who is casually dressed and fairly groomed. Her speech is monotonous but relevant. Her thought process is slow but logical and linear. Her attention and concentration is distracted. She denies any active or passive suicidal thoughts or homicidal thoughts. She still has residual paranoia that people calling her but there were no delusion or obsessions. She denies any auditory or visual hallucination. She's alert and oriented x3. Her insight judgment and impulse control is fair.  Assessment Axis I bipolar disorder Axis II deferred Axis III see medical history Axis IV mild to moderate Axis V 55-60  Plan I recommended to try Cymbalta 30 mg if that can help reducing her constipation. I also recommended to see her primary care physician if any of other medication can be reduced. She is taking Neurontin and multiple pain medication. I explained risks and benefits of medication. I also recommended to see therapist for increase coping and social skills. I also encouraged her to start some home projects to keep herself busy. Patient used to make quilt and not thinking to restart doing it. I recommended if decrease Cymbalta causing her more depressed and she should call us immediately. I will see in 2 weeks, time spent 30 minutes

## 2011-07-07 ENCOUNTER — Encounter (HOSPITAL_COMMUNITY): Payer: Self-pay | Admitting: Psychology

## 2011-07-07 ENCOUNTER — Ambulatory Visit (INDEPENDENT_AMBULATORY_CARE_PROVIDER_SITE_OTHER): Payer: No Typology Code available for payment source | Admitting: Psychology

## 2011-07-07 DIAGNOSIS — F4310 Post-traumatic stress disorder, unspecified: Secondary | ICD-10-CM

## 2011-07-07 DIAGNOSIS — F312 Bipolar disorder, current episode manic severe with psychotic features: Secondary | ICD-10-CM

## 2011-07-07 NOTE — Progress Notes (Signed)
Patient:  Allison Sharp   DOB: 09-Oct-1955  MR Number: [id number]  Location: [facility] [address] [place] [address] Dept: [phone]  Start: 2 PM End: 3 PM  Provider/Observer:     Hershal Coria PSYD  Chief Complaint:      Chief Complaint  Patient presents with  . Anxiety  . Depression  . Paranoid  . Hallucinations    Reason For Service:     the patient was referred because of the number of difficulties including significant change in her functioning after Aricept was stopped. She had significant difficulty with her severe sleep apnea and they have been working on trying to correct the problems with her CPAP device. The patient is continued to have significant word finding issues. The patient sister reports that the patient stopped reading or engaging in other things even that she used to enjoy a lot of books. The symptoms have deteriorated over the past five years.  More recently, the patient was hospitalized after an extended period of time of not sleeping very well and then developing visual hallucinations another dissociative types of experiences. These do appear to be related to extended sleep deprivation and worsening of her underlying psychiatric condition related to bipolar disorder.  Interventions Strategy:  Cognitive/behavioral psychotherapeutic interventions  Participation Level:   Active  Participation Quality:  Appropriate      Behavioral Observation:  Fairly Groomed, Alert, and Depressed.   Current Psychosocial Factors: The patient has been discharged from the inpatient unit and has since seen Dr. Lolly Mustache as well. She and her sister both report that she has been doing much better since her release from the hospital. They report that she simply was not able to get over the loss of her friend and began to sleep very poorly and been then began to have dissociative-like  experiences and also hallucinatory types of experiences. She was admitted to the hospital and spent a week there.  Content of Session:   Review current symptoms and continued to work on therapeutic interventions.  Current Status:   Much improved over her status prior to her hospitalization.  Patient Progress:   Good  Target Goals:   Target goals include reducing the intensity, frequency, and duration of significant mood changes as well as issues of depression and residual effects of PTSD. Dissociative experiences are also goals for reduction.  Last Reviewed:   07/07/2011  Goals Addressed Today:    Today we worked on issues primarily with regard to understanding the pattern of the development of these dissociative experiences as well some of the basic coping skills she needs to maintain after discharge.  Impression/Diagnosis:   at this point, I do think that there is some significant depression and anxiety types of symptoms but her most recent pattern is consistent with a bipolar disorder with psychotic features. She ignores the sustained pattern of sleep disturbance in sleep loss and inability to deal with and adjust to the loss of a friend which triggered long-standing emotions and conflicts around the death of her parents. Long-standing issues related to PTSD in childhood traumas led to an exacerbation of underlying dissociative coping skills.  Diagnosis:    Axis I:  1. Bipolar I disorder, most recent episode (or current) manic, severe, specified as with psychotic behavior   2. Post traumatic stress disorder (PTSD)         Axis II: Deferred

## 2011-07-25 ENCOUNTER — Ambulatory Visit (INDEPENDENT_AMBULATORY_CARE_PROVIDER_SITE_OTHER): Payer: No Typology Code available for payment source | Admitting: Psychiatry

## 2011-07-25 ENCOUNTER — Encounter (HOSPITAL_COMMUNITY): Payer: Self-pay | Admitting: Psychiatry

## 2011-07-25 VITALS — Wt 388.0 lb

## 2011-07-25 DIAGNOSIS — F319 Bipolar disorder, unspecified: Secondary | ICD-10-CM

## 2011-07-25 MED ORDER — DIAZEPAM 5 MG PO TABS: 5.0000 mg | ORAL_TABLET | Freq: Every day | ORAL | Status: DC

## 2011-07-25 MED ORDER — ARIPIPRAZOLE 10 MG PO TABS: 10.0000 mg | ORAL_TABLET | Freq: Every day | ORAL | Status: DC

## 2011-07-25 MED ORDER — DULOXETINE HCL 60 MG PO CPEP: 60.0000 mg | ORAL_CAPSULE | Freq: Every day | ORAL | Status: DC

## 2011-07-25 MED ORDER — DIVALPROEX SODIUM ER 500 MG PO TB24: 1000.0000 mg | ORAL_TABLET | Freq: Every day | ORAL | Status: DC

## 2011-07-25 NOTE — Progress Notes (Signed)
Chief complaint I tried 30 mg Cymbalta but did not work   History of presenting illness Patient is 56 year old Caucasian female who came for her appointment with her sister. Patient told she tried cutting down her Cymbalta from 60 mg to 30 mg but she experience nervousness anxiety and more depressed. She also believe hearing voices and scared feelings. She is taking 60 mg now and doing very well. Her sister also endorse that patient is doing much better with the current regime and does not want to change the dosage. On her last visit he did do Cymbalta as patient complained of constipation however patient realized that her constipation does not get better with 30 mg of Cymbalta. Patient is scheduled to see her pain doctor and primary care physician and will discuss this issue with them. Overall patient is stable and reported no agitation anger crying spells or any hallucination. She is very excited as she is planning to see her grandchildren in [place]. She is not sure when will happen but hoping to soon see them. She is sleeping fine. Her appetite is unchanged. She is also happy that did not gain more weight. She is spending more time outside and more involved in her life.   Current psychiatric medication Abilify 10 mg daily Valium 5 mg at bedtime Depakote 1000  mg at bedtime Cymbalta 60 mg daily  Past psychiatric history Patient has multiple psychiatric admission. She has at least 3 his suicidal attempt in the past. Her last admission was in January 2013. In the past she had tried Paxil Prozac Wellbutrin Effexor Lexapro amitriptyline however she always had a good response with Cymbalta but it causes constipation. She has been diagnosed with bipolar disorder  Medical history Patient has history of hyperlipidemia, GERD, chronic back pain, obesity, arthritis, hypertension, abstract and sleep apnea, vitamin D deficiency and history of myocardial infarction. Her primary care physician is [facility].  Lab results Her blood work is reviewed. Her Depakote level is 57 which was drawn on January 31.  Family history Patient has family member who has been diagnosed with bipolar disorder  Mental status examination Patient is morbid obese female who is casually dressed and fairly groomed. Her speech is monotonous but relevant. Her thought process is slow but logical and linear. Her attention and concentration is distracted. She denies any active or passive suicidal thoughts or homicidal thoughts. She still has residual paranoia that people calling her but there were no delusion or obsessions. She denies any auditory or visual hallucination. She's alert and oriented x3. Her insight judgment and impulse control is fair.  Assessment Axis I bipolar disorder  Axis II deferred  Axis III see medical history Axis IV mild to moderate  Axis V 55-60  Plan I will continue Cymbalta 60 mg. I explained possible withdrawal symptoms of reducing Cymbalta. Patient does not want to change her medication. We will continue her Valium Depakote and Abilify at present does. We also talked about other possibilities that cause constipation. At this time patient is taking Neurontin 300 mg 5 times a day I recommended to discuss with her pain doctor if she can reduce the dose to 3 times a day that may help reducing her weight and possible constipation. She will continue to see therapist on November basis. I have explained risks and benefits of medication. I will see her again in one month. Time spent 30

## 2011-07-28 ENCOUNTER — Ambulatory Visit (HOSPITAL_COMMUNITY): Payer: Medicare Other | Admitting: Psychology

## 2011-08-04 ENCOUNTER — Other Ambulatory Visit: Payer: Self-pay

## 2011-08-04 ENCOUNTER — Encounter (HOSPITAL_COMMUNITY): Payer: Self-pay

## 2011-08-04 ENCOUNTER — Emergency Department (HOSPITAL_COMMUNITY): Payer: Medicare Other

## 2011-08-04 ENCOUNTER — Emergency Department (HOSPITAL_COMMUNITY)
Admission: EM | Admit: 2011-08-04 | Discharge: 2011-08-04 | Disposition: A | Discharge status: Home / Self Care | Payer: Medicare Other | Attending: Emergency Medicine | Admitting: Emergency Medicine

## 2011-08-04 DIAGNOSIS — R079 Chest pain, unspecified: Secondary | ICD-10-CM | POA: Insufficient documentation

## 2011-08-04 DIAGNOSIS — I252 Old myocardial infarction: Secondary | ICD-10-CM | POA: Insufficient documentation

## 2011-08-04 DIAGNOSIS — R42 Dizziness and giddiness: Secondary | ICD-10-CM | POA: Insufficient documentation

## 2011-08-04 DIAGNOSIS — I2510 Atherosclerotic heart disease of native coronary artery without angina pectoris: Secondary | ICD-10-CM | POA: Insufficient documentation

## 2011-08-04 DIAGNOSIS — M129 Arthropathy, unspecified: Secondary | ICD-10-CM | POA: Insufficient documentation

## 2011-08-04 DIAGNOSIS — Z79899 Other long term (current) drug therapy: Secondary | ICD-10-CM | POA: Insufficient documentation

## 2011-08-04 DIAGNOSIS — I10 Essential (primary) hypertension: Secondary | ICD-10-CM | POA: Insufficient documentation

## 2011-08-04 DIAGNOSIS — K219 Gastro-esophageal reflux disease without esophagitis: Secondary | ICD-10-CM | POA: Insufficient documentation

## 2011-08-04 DIAGNOSIS — E785 Hyperlipidemia, unspecified: Secondary | ICD-10-CM | POA: Insufficient documentation

## 2011-08-04 DIAGNOSIS — R110 Nausea: Secondary | ICD-10-CM | POA: Insufficient documentation

## 2011-08-04 DIAGNOSIS — E039 Hypothyroidism, unspecified: Secondary | ICD-10-CM | POA: Insufficient documentation

## 2011-08-04 DIAGNOSIS — Z7982 Long term (current) use of aspirin: Secondary | ICD-10-CM | POA: Insufficient documentation

## 2011-08-04 DIAGNOSIS — F3130 Bipolar disorder, current episode depressed, mild or moderate severity, unspecified: Secondary | ICD-10-CM | POA: Insufficient documentation

## 2011-08-04 LAB — DIFFERENTIAL
Basophils Relative: 1 % (ref 0–1)
Eosinophils Absolute: 0.3 10*3/uL (ref 0.0–0.7)
Lymphs Abs: 2.7 10*3/uL (ref 0.7–4.0)
Neutrophils Relative %: 55 % (ref 43–77)

## 2011-08-04 LAB — TROPONIN I: Troponin I: <0.3 ng/mL (ref <0.30)

## 2011-08-04 LAB — BASIC METABOLIC PANEL
GFR calc Af Amer: >90 mL/min (ref >90)
GFR calc non Af Amer: >90 mL/min (ref >90)
Glucose, Bld: 92 mg/dL (ref 70–99)
Potassium: 3.6 mEq/L (ref 3.5–5.1)
Sodium: 140 mEq/L (ref 135–145)

## 2011-08-04 LAB — CBC
MCH: 31 pg (ref 26.0–34.0)
Platelets: 140 10*3/uL — ABNORMAL LOW (ref 150–400)
RBC: 4.2 MIL/uL (ref 3.87–5.11)

## 2011-08-04 MED ORDER — PANTOPRAZOLE SODIUM 40 MG IV SOLR
40.0000 mg | Freq: Once | INTRAVENOUS | Status: AC
  Administered 2011-08-04: 40 mg via INTRAVENOUS
  Filled 2011-08-04: qty 40

## 2011-08-04 MED ORDER — MORPHINE SULFATE 2 MG/ML IJ SOLN
2.0000 mg | Freq: Once | INTRAMUSCULAR | Status: AC
  Administered 2011-08-04: 2 mg via INTRAVENOUS
  Filled 2011-08-04: qty 1

## 2011-08-04 MED ORDER — ACETAMINOPHEN 325 MG PO TABS
650.0000 mg | ORAL_TABLET | Freq: Once | ORAL | Status: AC
  Administered 2011-08-04: 650 mg via ORAL
  Filled 2011-08-04: qty 2

## 2011-08-04 MED ORDER — ONDANSETRON HCL 4 MG/2ML IJ SOLN
4.0000 mg | Freq: Once | INTRAMUSCULAR | Status: AC
  Administered 2011-08-04: 4 mg via INTRAVENOUS
  Filled 2011-08-04: qty 2

## 2011-08-04 NOTE — ED Notes (Signed)
Previous assessment charted by Youlanda Mighty Rn

## 2011-08-04 NOTE — Discharge Instructions (Signed)
Your blood work, heart numbers, EKG and chest xray were normal here today.Continue your home medicines. Use a little mylanta before each meal and at bedtime.  Your caregiver has diagnosed you as having chest pain that is not specific for one problem, but does not require admission.  Chest pain comes from many different causes.  SEEK IMMEDIATE MEDICAL ATTENTION IF: You have severe chest pain, especially if the pain is crushing or pressure-like and spreads to the arms, back, neck, or jaw, or if you have sweating, nausea (feeling sick to your stomach), or shortness of breath. THIS IS AN EMERGENCY. Don't wait to see if the pain will go away. Get medical help at once. Call 911 or 0 (operator). DO NOT drive yourself to the hospital.  Your chest pain gets worse and does not go away with rest.  You have an attack of chest pain lasting longer than usual, despite rest and treatment with the medications your caregiver has prescribed.  You wake from sleep with chest pain or shortness of breath.  You feel dizzy or faint.  You have chest pain not typical of your usual pain for which you originally saw your caregiver.

## 2011-08-04 NOTE — ED Notes (Signed)
Pt states that she became dizzy today, then began to experience chest pressure located in center of chest that radiates to back. Pt also c/o sob, nausea.

## 2011-08-04 NOTE — ED Provider Notes (Signed)
History     CSN: [id number]  Arrival date & time 08/04/11  1422   First MD Initiated Contact with Patient 08/04/11 1440      Chief Complaint  Patient presents with  . Chest Pain  . Nausea  . Dizziness    (Consider location/radiation/quality/duration/timing/severity/associated sxs/prior treatment) HPI  Allison Sharp is a 56 y.o. female who presents to the Emergency Department complaining of  Dizziness associated with mid chest pain that radiates to her back. She is currently being treated for URI with second course of antibiotics (Levaquin 750 mg). With sharp pain to back she experienced nausea, no vomiting and shortness of breath.Denies fever, chills, diarrhea, vomiting.She has had a cough associated with the URI for over a week.    PCP Dr. Stacie Acres  Past Medical History  Diagnosis Date  . Hyperlipidemia   . CAD (coronary artery disease)   . Bipolar 1 disorder   . Schizophrenia   . GERD (gastroesophageal reflux disease)   . Hyperthyroidism   . Chronic back pain   . Morbid obesity   . Dyspnea     PFT 03/05/09 FEV1 2.77(98%), FVC 3.25(86%), FEV1% 85, TLC 5.88(99%), DLCO 60% ,  Methacholine challenge 03/16/09 normal ,  CT chest 03/12/09 no pulmonary disease  . Anxiety   . Arthritis   . Depression   . OSA on CPAP     2 liters  . HTN (hypertension)   . History of colonoscopy 10/17/2002    by Dr Rehman-> distal non-specific proctitis, small ext hemorrhoids,   . Fungal infection   . Cellulitis   . Contusion of sacrum   . Migraine headache   . Vitamin d deficiency   . Sleep apnea   . Complication of anesthesia     States she typically gets sick s/p anesthesia  . Hypothyroidism   . Myocardial infarction     NOV 1997    Past Surgical History  Procedure Date  . Cholecystectomy   . Knee arthroscopy   . Appendectomy   . Tonsillectomy   . Back surgery   . Total vaginal hysterectomy   . Cardiac catheterization     nov 1997  . Abdominal hysterectomy     sept 1996  .  Joint replacement     bil knee replacement  . Tubal ligation     Family History  Problem Relation Age of Onset  . Cancer Mother   . Hypertension Mother   . Coronary artery disease Father   . Hypertension Brother   . Coronary artery disease Brother   . Anesthesia problems Neg Hx   . Hypotension Neg Hx   . Malignant hyperthermia Neg Hx   . Pseudochol deficiency Neg Hx     History  Substance Use Topics  . Smoking status: Never Smoker   . Smokeless tobacco: Not on file  . Alcohol Use: No    OB History    Grav Para Term Preterm Abortions TAB SAB Ect Mult Living                  Review of Systems A 10 review of systems reviewed and are negative for acute change except as noted in the HPI. Allergies  Ativan and Haldol  Home Medications   Current Outpatient Rx  Name Route Sig Dispense Refill  . ARIPIPRAZOLE 10 MG PO TABS Oral Take 1 tablet (10 mg total) by mouth daily. 30 tablet 1  . ASPIRIN 81 MG PO TBEC Oral Take 81 mg by  mouth daily.      . ATORVASTATIN CALCIUM 40 MG PO TABS Oral Take 40 mg by mouth at bedtime.     Marland Kitchen BUTALBITAL-APAP-CAFFEINE 50-325-40 MG PO TABS  Take 1 tablet every 8 hours as needed for headache.  Do not exceed 2 tablets daily. 24 tablet 0  . VITAMIN D 2000 UNITS PO TABS Oral Take 2,000 Units by mouth daily.    Marland Kitchen DIAZEPAM 5 MG PO TABS Oral Take 1 tablet (5 mg total) by mouth at bedtime. For anxiety 30 tablet 1  . DIVALPROEX SODIUM ER 500 MG PO TB24 Oral Take 2 tablets (1,000 mg total) by mouth at bedtime. 60 tablet 1  . DULOXETINE HCL 60 MG PO CPEP Oral Take 1 capsule (60 mg total) by mouth daily. 30 capsule 1  . FUROSEMIDE 20 MG PO TABS Oral Take 20-40 mg by mouth See admin instructions. Pt ALTERNATES between 20 mg and 40 mg once daily.    Marland Kitchen GABAPENTIN 300 MG PO CAPS Oral Take 1-2 capsules (300-600 mg total) by mouth See admin instructions. Take 1 tab in the AM, 2 tabs at noon, 2 tabs at bedtime. For chronic pain and anxiety.    Marland Kitchen LEVOTHYROXINE SODIUM  125 MCG PO TABS Oral Take 125 mcg by mouth at bedtime.     . MELOXICAM 15 MG PO TABS Oral Take 15 mg by mouth every evening.    Marland Kitchen METHOCARBAMOL 750 MG PO TABS Oral Take 750 mg by mouth at bedtime.     Marland Kitchen ONE-DAILY MULTI VITAMINS PO TABS Oral Take 1 tablet by mouth daily. Vitamin supplement.    Marland Kitchen NIACIN ER (ANTIHYPERLIPIDEMIC) 500 MG PO TBCR Oral Take 500 mg by mouth at bedtime.    . OMEPRAZOLE 20 MG PO CPDR Oral Take 1 capsule (20 mg total) by mouth daily with breakfast. For acid reflux.    Marland Kitchen VERAPAMIL HCL ER (CO) 180 MG PO TB24 Oral Take 180 mg by mouth daily with breakfast.       BP 134/82  Pulse 83  Temp 98.1 F (36.7 C)  Resp 18  Ht 5\' 9"  (1.753 m)  Wt 388 lb (175.996 kg)  BMI 57.30 kg/m2  SpO2 99%  Physical Exam Physical examination:  Nursing notes reviewed; Vital signs and O2 SAT reviewed;  Constitutional: Well developed, Well nourished, Well hydrated, In no acute distress; Head:  Normocephalic, atraumatic; Eyes: EOMI, PERRL, No scleral icterus; ENMT: Mouth and pharynx normal, Mucous membranes moist; Neck: Supple, Full range of motion, No lymphadenopathy; Cardiovascular: Regular rate and rhythm, No murmur, rub, or gallop; Respiratory: Breath sounds clear & equal bilaterally, No rales, rhonchi, wheezes, or rub, Normal respiratory effort/excursion; Chest: Nontender, Movement normal; Abdomen: Soft, Nontender, Nondistended, Normal bowel sounds; Genitourinary: No CVA tenderness; Extremities: Pulses normal, No tenderness, No edema, No calf edema or asymmetry.; Neuro: AA&Ox3, Major CN grossly intact.  No gross focal motor or sensory deficits in extremities.; Skin: Color normal, Warm, Dry  ED Course  Procedures (including critical care time)   Date: 08/04/2011  1435  Rate: 84  Rhythm: normal sinus rhythm  QRS Axis: normal  Intervals: normal  ST/T Wave abnormalities: normal  Conduction Disutrbances:incomplete RBBB  Narrative Interpretation:   Old EKG Reviewed: changes notedc/w 01/28/09 QT  no longer prolonged  Results for orders placed during the hospital encounter of 08/04/11  CBC      Component Value Range   WBC 7.9  4.0 - 10.5 (K/uL)   RBC 4.20  3.87 - 5.11 (MIL/uL)  Hemoglobin 13.0  12.0 - 15.0 (g/dL)   HCT 40.9  81.1 - 91.4 (%)   MCV 90.5  78.0 - 100.0 (fL)   MCH 31.0  26.0 - 34.0 (pg)   MCHC 34.2  30.0 - 36.0 (g/dL)   RDW 78.2  95.6 - 21.3 (%)   Platelets 140 (*) 150 - 400 (K/uL)  DIFFERENTIAL      Component Value Range   Neutrophils Relative 55  43 - 77 (%)   Neutro Abs 4.4  1.7 - 7.7 (K/uL)   Lymphocytes Relative 34  12 - 46 (%)   Lymphs Abs 2.7  0.7 - 4.0 (K/uL)   Monocytes Relative 8  3 - 12 (%)   Monocytes Absolute 0.6  0.1 - 1.0 (K/uL)   Eosinophils Relative 3  0 - 5 (%)   Eosinophils Absolute 0.3  0.0 - 0.7 (K/uL)   Basophils Relative 1  0 - 1 (%)   Basophils Absolute 0.0  0.0 - 0.1 (K/uL)  BASIC METABOLIC PANEL      Component Value Range   Sodium 140  135 - 145 (mEq/L)   Potassium 3.6  3.5 - 5.1 (mEq/L)   Chloride 102  96 - 112 (mEq/L)   CO2 29  19 - 32 (mEq/L)   Glucose, Bld 92  70 - 99 (mg/dL)   BUN 11  6 - 23 (mg/dL)   Creatinine, Ser 0.86  0.50 - 1.10 (mg/dL)   Calcium 9.7  8.4 - 57.8 (mg/dL)   GFR calc non Af Amer >90  >90 (mL/min)   GFR calc Af Amer >90  >90 (mL/min)  TROPONIN I      Component Value Range   Troponin I <0.30  <0.30 (ng/mL)  Dg Chest 2 View  08/04/2011  *RADIOLOGY REPORT*  Clinical Data: Chest pain.  Nausea and dizziness.  Hypertension. Coronary artery disease.  Morbid obesity.  CHEST - 2 VIEW  Comparison: 02/23/2009 and CT of 03/12/2009  Findings: Numerous leads and wires project over the chest.  Midline trachea.  Mild cardiomegaly. No pleural effusion or pneumothorax. Chronic mild interstitial thickening is nonspecific. No congestive failure.  Clear lungs.  IMPRESSION: Mild cardiomegaly, without acute disease.  Original Report Authenticated By: Consuello Bossier, M.D.    MDM  Patient with sharp mid chest pain that radiates  to back that began today at home. She is currently on antibiotics for URI. Labs are unremarkable. EKG, chest xray, troponin negative. Given IVF, analgesic, antiemetic PPI with relief.  Pt feels improved after observation and/or treatment in ED.Pt stable in ED with no significant deterioration in condition.The patient appears reasonably screened and/or stabilized for discharge and I doubt any other medical condition or other [facility] requiring further screening, evaluation, or treatment in the ED at this time prior to discharge.  MDM Reviewed: previous chart, nursing note and vitals Interpretation: labs, ECG and x-ray           Nicoletta Dress. Colon Branch, MD 08/04/11 [id number]

## 2011-08-04 NOTE — ED Notes (Signed)
Snack given until pt can get a meal.   

## 2011-08-07 ENCOUNTER — Ambulatory Visit (INDEPENDENT_AMBULATORY_CARE_PROVIDER_SITE_OTHER): Payer: No Typology Code available for payment source | Admitting: Psychology

## 2011-08-07 DIAGNOSIS — F4310 Post-traumatic stress disorder, unspecified: Secondary | ICD-10-CM

## 2011-08-07 DIAGNOSIS — F312 Bipolar disorder, current episode manic severe with psychotic features: Secondary | ICD-10-CM

## 2011-08-08 ENCOUNTER — Encounter (HOSPITAL_COMMUNITY): Payer: Self-pay | Admitting: Psychology

## 2011-08-08 NOTE — Progress Notes (Signed)
Patient:  Allison Sharp   DOB: 01-07-1956  MR Number: [id number]  Location: [facility] [address] [place] [address] Dept: [phone]  Start: 2 PM End: 3 PM  Provider/Observer:     Hershal Coria PSYD  Chief Complaint:      Chief Complaint  Patient presents with  . Anxiety  . Depression  . Stress    Reason For Service:     the patient was referred because of the number of difficulties including significant change in her functioning after Aricept was stopped. She had significant difficulty with her severe sleep apnea and they have been working on trying to correct the problems with her CPAP device. The patient is continued to have significant word finding issues. The patient sister reports that the patient stopped reading or engaging in other things even that she used to enjoy a lot of books. The symptoms have deteriorated over the past five years.  More recently, the patient was hospitalized after an extended period of time of not sleeping very well and then developing visual hallucinations another dissociative types of experiences. These do appear to be related to extended sleep deprivation and worsening of her underlying psychiatric condition related to bipolar disorder.  Interventions Strategy:  Cognitive/behavioral psychotherapeutic interventions  Participation Level:   Active  Participation Quality:  Appropriate      Behavioral Observation:  Fairly Groomed, Alert, and Depressed.   Current Psychosocial Factors: The patient reports that she has been doing much better recently. She reports that there has been a significant psychosocial stressor having to do with her daughter and grandchildren. There is a situation where the patient had stayed with her grandchild and her daughter overnight and there were late night pain-free phone conversations between her daughter and her daughter's  ex-husband. The patient reports that she is stressed by this but also maintains a relationship with the ex-husband that is positive as well as the ex-husband's family.  Content of Session:   Review current symptoms and continued to work on therapeutic interventions.  Current Status:   Much improved over her status prior to her hospitalization.  Patient Progress:   Good  Target Goals:   Target goals include reducing the intensity, frequency, and duration of significant mood changes as well as issues of depression and residual effects of PTSD. Dissociative experiences are also goals for reduction.  Last Reviewed:   08/07/2011  Goals Addressed Today:    Today we worked on issues primarily with regard to understanding the pattern of the development of these dissociative experiences as well some of the basic coping skills she needs to maintain after discharge.  Impression/Diagnosis:   at this point, I do think that there is some significant depression and anxiety types of symptoms but her most recent pattern is consistent with a bipolar disorder with psychotic features. She ignores the sustained pattern of sleep disturbance in sleep loss and inability to deal with and adjust to the loss of a friend which triggered long-standing emotions and conflicts around the death of her parents. Long-standing issues related to PTSD in childhood traumas led to an exacerbation of underlying dissociative coping skills.  Diagnosis:    Axis I:  1. Bipolar I disorder, most recent episode (or current) manic, severe, specified as with psychotic behavior   2. Post traumatic stress disorder (PTSD)         Axis II: Deferred

## 2011-08-15 ENCOUNTER — Telehealth (HOSPITAL_COMMUNITY): Payer: Self-pay | Admitting: *Deleted

## 2011-08-15 NOTE — Telephone Encounter (Signed)
She needs to see her Primary Care Physician for headache medicine.

## 2011-08-22 ENCOUNTER — Ambulatory Visit (HOSPITAL_COMMUNITY): Payer: No Typology Code available for payment source | Admitting: Psychiatry

## 2011-08-29 ENCOUNTER — Encounter (HOSPITAL_COMMUNITY): Payer: Self-pay | Admitting: Psychiatry

## 2011-08-29 ENCOUNTER — Ambulatory Visit (INDEPENDENT_AMBULATORY_CARE_PROVIDER_SITE_OTHER): Payer: No Typology Code available for payment source | Admitting: Psychiatry

## 2011-08-29 DIAGNOSIS — F319 Bipolar disorder, unspecified: Secondary | ICD-10-CM

## 2011-08-29 MED ORDER — DIAZEPAM 5 MG PO TABS: 5.0000 mg | ORAL_TABLET | Freq: Every day | ORAL | Status: DC

## 2011-08-29 NOTE — Progress Notes (Signed)
Chief complaint I am doing better on my medication.    History of presenting illness Patient is 56 year old Caucasian female who came for her follow up appointment. Patient told she recently visited her son who has diagnosed with cancer .  Patient was stressed about her son but hoping that he will recover with cancer .  Patient is living with her nephew and feel very relaxed and calm there .  She is been compliant with her Cymbalta , Valium , Abilify and Depakote .  She is sleeping fine and reported no side effects of medication.  Since she increased Cymbalta to 60 mg her anxiety and depression is much improved.  She denies any agitation anger or mood swings.  She denies any crying spells her social isolation .  She is actually handling better on medication despite she is concerned about her son .  She spending more time outside however her weight remained unchanged from the past .  Current psychiatric medication Abilify 10 mg daily Valium 5 mg at bedtime Depakote 1000  mg at bedtime Cymbalta 60 mg daily  Past psychiatric history Patient has multiple psychiatric admission. She has at least 3 his suicidal attempt in the past. Her last admission was in January 2013. In the past she had tried Paxil Prozac Wellbutrin Effexor Lexapro amitriptyline however she always had a good response with Cymbalta but it causes constipation. She has been diagnosed with bipolar disorder  Medical history Patient has history of hyperlipidemia, GERD, chronic back pain, obesity, arthritis, hypertension, abstract and sleep apnea, vitamin D deficiency and history of myocardial infarction. Her primary care physician is [facility].  Family history Patient has family member who has been diagnosed with bipolar disorder  Mental status examination Patient is morbid obese female who is casually dressed and fairly groomed. Her speech is slow but relevant. Her thought process is also slow but logical and  linear. Her attention and concentration is fair.  She denies any active or passive suicidal thoughts or homicidal thoughts. She denies any auditory or visual hallucination.  Her paranoia and delusions are less intense and less frequent.  She's alert and oriented x3. Her insight judgment and impulse control is fair.  Assessment Axis I bipolar disorder  Axis II deferred  Axis III see medical history Axis IV mild to moderate  Axis V 55-60  Plan I will continue her current psychiatric medication which are Cymbalta 60 mg, Abilify 10 mg daily, Valium 5 mg at bedtime and Depakote thousand milligram at bedtime.  She denies any side effects of medication.  She's not drinking alcohol or using any drugs.  She does not ask a refill for her Valium.  She will continue to see therapist for increase coping and social skills I will see her again in 8 weeks.

## 2011-09-08 ENCOUNTER — Ambulatory Visit (HOSPITAL_COMMUNITY): Payer: Self-pay | Admitting: Psychology

## 2011-09-15 ENCOUNTER — Institutional Professional Consult (permissible substitution): Payer: Self-pay | Admitting: Pulmonary Disease

## 2011-09-19 ENCOUNTER — Ambulatory Visit: Payer: Self-pay | Admitting: Pulmonary Disease

## 2011-09-25 ENCOUNTER — Encounter (HOSPITAL_COMMUNITY): Payer: Self-pay | Admitting: Emergency Medicine

## 2011-09-25 ENCOUNTER — Emergency Department (HOSPITAL_COMMUNITY)
Admission: EM | Admit: 2011-09-25 | Discharge: 2011-09-25 | Disposition: A | Discharge status: Other Acute Inpatient Hospital | Payer: Medicare Other | Attending: Emergency Medicine | Admitting: Emergency Medicine

## 2011-09-25 ENCOUNTER — Other Ambulatory Visit (HOSPITAL_COMMUNITY): Payer: Self-pay | Admitting: Psychiatry

## 2011-09-25 ENCOUNTER — Inpatient Hospital Stay (HOSPITAL_COMMUNITY)
Admission: AD | Admit: 2011-09-25 | Discharge: 2011-10-03 | DRG: 885 | Disposition: A | Discharge status: Home / Self Care | Payer: No Typology Code available for payment source | Source: Ambulatory Visit | Attending: Psychiatry | Admitting: Psychiatry

## 2011-09-25 DIAGNOSIS — F319 Bipolar disorder, unspecified: Secondary | ICD-10-CM

## 2011-09-25 DIAGNOSIS — M549 Dorsalgia, unspecified: Secondary | ICD-10-CM | POA: Insufficient documentation

## 2011-09-25 DIAGNOSIS — R45851 Suicidal ideations: Secondary | ICD-10-CM

## 2011-09-25 DIAGNOSIS — R112 Nausea with vomiting, unspecified: Secondary | ICD-10-CM | POA: Diagnosis not present

## 2011-09-25 DIAGNOSIS — G8929 Other chronic pain: Secondary | ICD-10-CM | POA: Insufficient documentation

## 2011-09-25 DIAGNOSIS — Z6841 Body Mass Index (BMI) 40.0 and over, adult: Secondary | ICD-10-CM

## 2011-09-25 DIAGNOSIS — F3289 Other specified depressive episodes: Secondary | ICD-10-CM | POA: Insufficient documentation

## 2011-09-25 DIAGNOSIS — F250 Schizoaffective disorder, bipolar type: Secondary | ICD-10-CM | POA: Diagnosis present

## 2011-09-25 DIAGNOSIS — I2510 Atherosclerotic heart disease of native coronary artery without angina pectoris: Secondary | ICD-10-CM | POA: Insufficient documentation

## 2011-09-25 DIAGNOSIS — I10 Essential (primary) hypertension: Secondary | ICD-10-CM | POA: Insufficient documentation

## 2011-09-25 DIAGNOSIS — G4733 Obstructive sleep apnea (adult) (pediatric): Secondary | ICD-10-CM | POA: Insufficient documentation

## 2011-09-25 DIAGNOSIS — E785 Hyperlipidemia, unspecified: Secondary | ICD-10-CM | POA: Diagnosis present

## 2011-09-25 DIAGNOSIS — I252 Old myocardial infarction: Secondary | ICD-10-CM | POA: Insufficient documentation

## 2011-09-25 DIAGNOSIS — E039 Hypothyroidism, unspecified: Secondary | ICD-10-CM | POA: Diagnosis present

## 2011-09-25 DIAGNOSIS — F259 Schizoaffective disorder, unspecified: Principal | ICD-10-CM | POA: Diagnosis present

## 2011-09-25 DIAGNOSIS — F32A Depression, unspecified: Secondary | ICD-10-CM

## 2011-09-25 DIAGNOSIS — R110 Nausea: Secondary | ICD-10-CM | POA: Insufficient documentation

## 2011-09-25 DIAGNOSIS — F4310 Post-traumatic stress disorder, unspecified: Secondary | ICD-10-CM | POA: Diagnosis present

## 2011-09-25 DIAGNOSIS — R450 Nervousness: Secondary | ICD-10-CM | POA: Insufficient documentation

## 2011-09-25 DIAGNOSIS — F329 Major depressive disorder, single episode, unspecified: Secondary | ICD-10-CM | POA: Insufficient documentation

## 2011-09-25 DIAGNOSIS — G43909 Migraine, unspecified, not intractable, without status migrainosus: Secondary | ICD-10-CM | POA: Diagnosis present

## 2011-09-25 DIAGNOSIS — K5900 Constipation, unspecified: Secondary | ICD-10-CM | POA: Diagnosis present

## 2011-09-25 DIAGNOSIS — Z7982 Long term (current) use of aspirin: Secondary | ICD-10-CM

## 2011-09-25 DIAGNOSIS — Z79899 Other long term (current) drug therapy: Secondary | ICD-10-CM | POA: Insufficient documentation

## 2011-09-25 DIAGNOSIS — K219 Gastro-esophageal reflux disease without esophagitis: Secondary | ICD-10-CM | POA: Diagnosis present

## 2011-09-25 DIAGNOSIS — F202 Catatonic schizophrenia: Secondary | ICD-10-CM | POA: Insufficient documentation

## 2011-09-25 DIAGNOSIS — M129 Arthropathy, unspecified: Secondary | ICD-10-CM | POA: Diagnosis present

## 2011-09-25 LAB — COMPREHENSIVE METABOLIC PANEL WITH GFR
CO2: 29 meq/L (ref 19–32)
Calcium: 10 mg/dL (ref 8.4–10.5)
Creatinine, Ser: 0.72 mg/dL (ref 0.50–1.10)
GFR calc Af Amer: >90 mL/min (ref >90)
GFR calc non Af Amer: >90 mL/min (ref >90)
Glucose, Bld: 94 mg/dL (ref 70–99)

## 2011-09-25 LAB — CBC
HCT: 41.8 % (ref 36.0–46.0)
Hemoglobin: 13.9 g/dL (ref 12.0–15.0)
MCH: 30.7 pg (ref 26.0–34.0)
MCHC: 33.3 g/dL (ref 30.0–36.0)
MCV: 92.3 fL (ref 78.0–100.0)
Platelets: 125 K/uL — ABNORMAL LOW (ref 150–400)
RBC: 4.53 MIL/uL (ref 3.87–5.11)
RDW: 13 % (ref 11.5–15.5)
WBC: 7 10*3/uL (ref 4.0–10.5)

## 2011-09-25 LAB — COMPREHENSIVE METABOLIC PANEL
ALT: 12 U/L (ref 0–35)
AST: 15 U/L (ref 0–37)
Albumin: 3.8 g/dL (ref 3.5–5.2)
Alkaline Phosphatase: 100 U/L (ref 39–117)
BUN: 12 mg/dL (ref 6–23)
Chloride: 101 mEq/L (ref 96–112)
Potassium: 4.2 mEq/L (ref 3.5–5.1)
Sodium: 141 mEq/L (ref 135–145)
Total Bilirubin: 0.4 mg/dL (ref 0.3–1.2)
Total Protein: 7.2 g/dL (ref 6.0–8.3)

## 2011-09-25 LAB — RAPID URINE DRUG SCREEN, HOSP PERFORMED
Amphetamines: NOT DETECTED
Barbiturates: NOT DETECTED
Benzodiazepines: NOT DETECTED
Cocaine: NOT DETECTED
Opiates: NOT DETECTED
Tetrahydrocannabinol: NOT DETECTED

## 2011-09-25 LAB — ETHANOL: Alcohol, Ethyl (B): <11 mg/dL (ref 0–11)

## 2011-09-25 MED ORDER — ARIPIPRAZOLE 15 MG PO TABS
15.0000 mg | ORAL_TABLET | Freq: Every day | ORAL | Status: DC
  Filled 2011-09-25: qty 1

## 2011-09-25 MED ORDER — GABAPENTIN 600 MG PO TABS
600.0000 mg | ORAL_TABLET | Freq: Two times a day (BID) | ORAL | Status: DC
  Administered 2011-09-25: 600 mg via ORAL
  Filled 2011-09-25 (×2): qty 1

## 2011-09-25 MED ORDER — FUROSEMIDE 20 MG PO TABS
20.0000 mg | ORAL_TABLET | ORAL | Status: DC
  Filled 2011-09-25: qty 1

## 2011-09-25 MED ORDER — VITAMIN D3 25 MCG (1000 UNIT) PO TABS
2000.0000 [IU] | ORAL_TABLET | Freq: Every day | ORAL | Status: DC
  Filled 2011-09-25: qty 2

## 2011-09-25 MED ORDER — DIVALPROEX SODIUM ER 500 MG PO TB24
1000.0000 mg | ORAL_TABLET | Freq: Every day | ORAL | Status: DC
  Administered 2011-09-25: 1000 mg via ORAL
  Filled 2011-09-25: qty 2

## 2011-09-25 MED ORDER — ARIPIPRAZOLE 15 MG PO TABS: 15.0000 mg | ORAL_TABLET | Freq: Every day | ORAL | Status: DC

## 2011-09-25 MED ORDER — ASPIRIN 81 MG PO CHEW
CHEWABLE_TABLET | ORAL | Status: AC
  Filled 2011-09-25: qty 1

## 2011-09-25 MED ORDER — ACETAMINOPHEN 325 MG PO TABS: 650.0000 mg | ORAL_TABLET | Freq: Four times a day (QID) | ORAL | Status: DC | PRN

## 2011-09-25 MED ORDER — ALPRAZOLAM 0.5 MG PO TABS
0.5000 mg | ORAL_TABLET | Freq: Three times a day (TID) | ORAL | Status: DC | PRN
  Administered 2011-09-25: 0.5 mg via ORAL
  Filled 2011-09-25: qty 1

## 2011-09-25 MED ORDER — ONDANSETRON HCL 8 MG PO TABS: 4.0000 mg | ORAL_TABLET | Freq: Three times a day (TID) | ORAL | Status: DC | PRN

## 2011-09-25 MED ORDER — FUROSEMIDE 20 MG PO TABS: 40.0000 mg | ORAL_TABLET | ORAL | Status: DC

## 2011-09-25 MED ORDER — PANTOPRAZOLE SODIUM 40 MG PO TBEC
80.0000 mg | DELAYED_RELEASE_TABLET | Freq: Every day | ORAL | Status: DC
  Administered 2011-09-25: 80 mg via ORAL
  Filled 2011-09-25: qty 2

## 2011-09-25 MED ORDER — GABAPENTIN 300 MG PO CAPS
300.0000 mg | ORAL_CAPSULE | Freq: Every day | ORAL | Status: DC
  Filled 2011-09-25: qty 1

## 2011-09-25 MED ORDER — ADULT MULTIVITAMIN W/MINERALS CH
1.0000 | ORAL_TABLET | Freq: Every day | ORAL | Status: DC
  Filled 2011-09-25: qty 1

## 2011-09-25 MED ORDER — ALUM & MAG HYDROXIDE-SIMETH 200-200-20 MG/5ML PO SUSP: 30.0000 mL | ORAL | Status: DC | PRN

## 2011-09-25 MED ORDER — DULOXETINE HCL 60 MG PO CPEP
60.0000 mg | ORAL_CAPSULE | Freq: Every day | ORAL | Status: DC
  Filled 2011-09-25: qty 1

## 2011-09-25 MED ORDER — MAGNESIUM HYDROXIDE 400 MG/5ML PO SUSP: 30.0000 mL | Freq: Every day | ORAL | Status: DC | PRN

## 2011-09-25 MED ORDER — LEVOTHYROXINE SODIUM 125 MCG PO TABS
125.0000 ug | ORAL_TABLET | Freq: Every day | ORAL | Status: DC
  Filled 2011-09-25: qty 1

## 2011-09-25 MED ORDER — ACETAMINOPHEN 325 MG PO TABS: 650.0000 mg | ORAL_TABLET | ORAL | Status: DC | PRN

## 2011-09-25 MED ORDER — BENZTROPINE MESYLATE 1 MG PO TABS
1.0000 mg | ORAL_TABLET | Freq: Two times a day (BID) | ORAL | Status: DC | PRN
  Filled 2011-09-25: qty 1

## 2011-09-25 MED ORDER — BENZTROPINE MESYLATE 1 MG PO TABS: 1.0000 mg | ORAL_TABLET | Freq: Two times a day (BID) | ORAL | Status: DC | PRN

## 2011-09-25 MED ORDER — POLYETHYLENE GLYCOL 3350 17 G PO PACK
17.0000 g | PACK | Freq: Three times a day (TID) | ORAL | Status: DC
  Administered 2011-09-25: 17 g via ORAL
  Filled 2011-09-25 (×3): qty 1

## 2011-09-25 MED ORDER — MAGNESIUM CITRATE PO SOLN
1.0000 | Freq: Every day | ORAL | Status: DC | PRN
  Filled 2011-09-25: qty 296

## 2011-09-25 MED ORDER — VERAPAMIL HCL ER 180 MG PO TBCR
180.0000 mg | EXTENDED_RELEASE_TABLET | Freq: Every day | ORAL | Status: DC
  Filled 2011-09-25: qty 1

## 2011-09-25 MED ORDER — LUBIPROSTONE 8 MCG PO CAPS
8.0000 ug | ORAL_CAPSULE | Freq: Two times a day (BID) | ORAL | Status: DC
  Filled 2011-09-25 (×2): qty 1

## 2011-09-25 MED ORDER — DIAZEPAM 5 MG PO TABS: 5.0000 mg | ORAL_TABLET | Freq: Every evening | ORAL | Status: DC | PRN

## 2011-09-25 MED ORDER — NIACIN ER 500 MG PO CPCR
500.0000 mg | ORAL_CAPSULE | Freq: Every day | ORAL | Status: DC
  Filled 2011-09-25: qty 1

## 2011-09-25 MED ORDER — ALBUTEROL SULFATE HFA 108 (90 BASE) MCG/ACT IN AERS: 2.0000 | INHALATION_SPRAY | Freq: Four times a day (QID) | RESPIRATORY_TRACT | Status: DC | PRN

## 2011-09-25 MED ORDER — ATORVASTATIN CALCIUM 40 MG PO TABS
40.0000 mg | ORAL_TABLET | Freq: Every day | ORAL | Status: DC
  Administered 2011-09-25: 40 mg via ORAL
  Filled 2011-09-25: qty 1

## 2011-09-25 MED ORDER — IBUPROFEN 200 MG PO TABS: 600.0000 mg | ORAL_TABLET | Freq: Three times a day (TID) | ORAL | Status: DC | PRN

## 2011-09-25 MED ORDER — ASPIRIN EC 81 MG PO TBEC
81.0000 mg | DELAYED_RELEASE_TABLET | Freq: Every day | ORAL | Status: DC
  Administered 2011-09-25: 81 mg via ORAL
  Filled 2011-09-25: qty 1

## 2011-09-25 MED ORDER — PROMETHAZINE HCL 25 MG PO TABS
25.0000 mg | ORAL_TABLET | Freq: Four times a day (QID) | ORAL | Status: DC | PRN
  Filled 2011-09-25: qty 1

## 2011-09-25 MED ORDER — BUTALBITAL-APAP-CAFFEINE 50-325-40 MG PO TABS
1.0000 | ORAL_TABLET | Freq: Three times a day (TID) | ORAL | Status: DC | PRN
  Filled 2011-09-25: qty 1

## 2011-09-25 NOTE — ED Notes (Signed)
Belenda Cruise, ACT Team at bedside

## 2011-09-25 NOTE — ED Notes (Signed)
Received report

## 2011-09-25 NOTE — BH Assessment (Signed)
Assessment Note   Allison Sharp is an 56 y.o. female that was referred to [facility] by her sister today due to SI with plan.  Sister present during interview per pt request.  Pt reports she has been having SI over the last week and has had a plan to overdose on her medications for 2 days.  Pt has overdosed in the past in 1978 and 1994 and was hospitalized once in 1994 at [facility] for this.  Pt stated she feels she can do nothing right and does not want to live anymore.  Pt and sister reported she was admitted inpatient to [facility] for Bipolar Disorder in February 2013 for Bipolar Disorder and grief issues, as her friends passed away.  At that time her medications were changed.  Pt stated she felt [facility] helped her.  Pt is a pt of Dr. Sheela Stack and she is followed by him as well as a psychologist on an outpatient basis.  Pt reported she is compliant with her meds.  Pt denies HI.  Pt denies AVH, but reports she has heard voices and seen people not there in the past.  Pt denies SA.  Pt denies mood swings, but admits to racing thoughts.  Pt's sister also stated pt is paranoid and believes that their older sister poisoned one of her sandwiches in the past.  Pt also stepped on her sister's scale and weight 400 lbs, and she is sure her sister saw that weight.  Pt stated her weight, worrying about her sons (one of whom she had a falling out with and the other's whereabouts unknown) and the death of her friends last year in a fire have led to increasing depression.  However, pt stated that she always said if she hit 400 lbs, she would kill herself by report.  Pt reports symptoms of anxiety.  Pt has not bathed in 5 days, has not groomed herself and has not gotten out of the bed except to eat since Friday.  Per pt as well as per [facility], pt has been accepted by Dr. Lolly Mustache and was sent to [facility] for med clearance.  Per Dr. Oletta Lamas, pt is med cleared and was informed [facility] will call when bed available.  Completed assessment, assessment notification and  faxed to [facility] to log.  Updated ED staff.  Axis I: Bipolar, Depressed Axis II: Deferred Axis III:  Past Medical History  Diagnosis Date  . Hyperlipidemia   . CAD (coronary artery disease)   . Bipolar 1 disorder   . Schizophrenia   . GERD (gastroesophageal reflux disease)   . Hyperthyroidism   . Chronic back pain   . Morbid obesity   . Dyspnea     PFT 03/05/09 FEV1 2.77(98%), FVC 3.25(86%), FEV1% 85, TLC 5.88(99%), DLCO 60% ,  Methacholine challenge 03/16/09 normal ,  CT chest 03/12/09 no pulmonary disease  . Anxiety   . Arthritis   . Depression   . OSA on CPAP     2 liters  . HTN (hypertension)   . History of colonoscopy 10/17/2002    by Dr Rehman-> distal non-specific proctitis, small ext hemorrhoids,   . Fungal infection   . Cellulitis   . Contusion of sacrum   . Migraine headache   . Vitamin d deficiency   . Sleep apnea   . Complication of anesthesia     States she typically gets sick s/p anesthesia  . Hypothyroidism   . Myocardial infarction     NOV 1997  Axis IV: other psychosocial or environmental problems, problems related to social environment and problems with primary support group Axis V: 21-30 behavior considerably influenced by delusions or hallucinations OR serious impairment in judgment, communication OR inability to function in almost all areas  Past Medical History:  Past Medical History  Diagnosis Date  . Hyperlipidemia   . CAD (coronary artery disease)   . Bipolar 1 disorder   . Schizophrenia   . GERD (gastroesophageal reflux disease)   . Hyperthyroidism   . Chronic back pain   . Morbid obesity   . Dyspnea     PFT 03/05/09 FEV1 2.77(98%), FVC 3.25(86%), FEV1% 85, TLC 5.88(99%), DLCO 60% ,  Methacholine challenge 03/16/09 normal ,  CT chest 03/12/09 no pulmonary disease  . Anxiety   . Arthritis   . Depression   . OSA on CPAP     2 liters  . HTN (hypertension)   . History of colonoscopy 10/17/2002    by Dr Rehman-> distal non-specific  proctitis, small ext hemorrhoids,   . Fungal infection   . Cellulitis   . Contusion of sacrum   . Migraine headache   . Vitamin d deficiency   . Sleep apnea   . Complication of anesthesia     States she typically gets sick s/p anesthesia  . Hypothyroidism   . Myocardial infarction     NOV 1997    Past Surgical History  Procedure Date  . Cholecystectomy   . Knee arthroscopy   . Appendectomy   . Tonsillectomy   . Back surgery   . Total vaginal hysterectomy   . Cardiac catheterization     nov 1997  . Abdominal hysterectomy     sept 1996  . Joint replacement     bil knee replacement  . Tubal ligation     Family History:  Family History  Problem Relation Age of Onset  . Cancer Mother   . Hypertension Mother   . Coronary artery disease Father   . Hypertension Brother   . Coronary artery disease Brother   . Anesthesia problems Neg Hx   . Hypotension Neg Hx   . Malignant hyperthermia Neg Hx   . Pseudochol deficiency Neg Hx     Social History:  reports that she has never smoked. She does not have any smokeless tobacco history on file. She reports that she does not drink alcohol or use illicit drugs.  Additional Social History:  Alcohol / Drug Use Pain Medications: see list Prescriptions: see list Over the Counter: see list History of alcohol / drug use?: No history of alcohol / drug abuse Longest period of sobriety (when/how long): na Negative Consequences of Use:  (na) Withdrawal Symptoms:  (na) Allergies:  Allergies  Allergen Reactions  . Ativan (Lorazepam) Other (See Comments)    Delirium  . Haldol (Haloperidol Decanoate) Other (See Comments)    Delirium   . Naproxen Nausea And Vomiting    Home Medications:  (Not in a hospital admission)  OB/GYN Status:  No LMP recorded. Patient has had a hysterectomy.  General Assessment Data Location of Assessment: [facility] Living Arrangements: Alone Can pt return to current living arrangement?: Yes Admission  Status: Voluntary Is patient capable of signing voluntary admission?: Yes Transfer from: Acute Hospital Referral Source: Self/Family/Friend  Education Status Is patient currently in school?: No  Risk to self Suicidal Ideation: Yes-Currently Present Suicidal Intent: Yes-Currently Present Is patient at risk for suicide?: Yes Suicidal Plan?: Yes-Currently Present Specify Current Suicidal Plan:  to overdose on her medications Access to Means: Yes Specify Access to Suicidal Means: has medications What has been your use of drugs/alcohol within the last 12 months?: denies use Previous Attempts/Gestures: Yes How many times?: 2  (1978, 1944 - overdosed on medications) Other Self Harm Risks: denies Triggers for Past Attempts: Family contact;Other (Comment) (relationship and family issues, depression) Intentional Self Injurious Behavior: None (pt denies) Family Suicide History: No Recent stressful life event(s): Recent negative physical changes;Conflict (Comment) (conflict with son, significant weight gain) Persecutory voices/beliefs?: No Depression: Yes Depression Symptoms: Despondent;Insomnia;Tearfulness;Isolating;Fatigue;Guilt;Loss of interest in usual pleasures;Feeling worthless/self pity Substance abuse history and/or treatment for substance abuse?: No Suicide prevention information given to non-admitted patients: Not applicable  Risk to Others Homicidal Ideation: No Thoughts of Harm to Others: No Current Homicidal Intent: No Current Homicidal Plan: No Access to Homicidal Means: No Identified Victim: na History of harm to others?: No Assessment of Violence: None Noted Violent Behavior Description: na - pt calm, cooperative Does patient have access to weapons?: No Criminal Charges Pending?: No Does patient have a court date: No  Psychosis Hallucinations: None noted (has had A/V hallucinations in past) Delusions:  (believes her sister poisoned her food, paranoia of  sister)  Mental Status Report Appear/Hygiene: Poor hygiene Eye Contact: Good Motor Activity: Unremarkable Speech: Logical/coherent Level of Consciousness: Alert Mood: Depressed;Sad;Helpless Affect: Depressed;Sad Anxiety Level: Moderate Thought Processes: Coherent;Relevant Judgement: Unimpaired Orientation: Person;Place;Time;Situation Obsessive Compulsive Thoughts/Behaviors: None  Cognitive Functioning Concentration: Decreased Memory: Recent Impaired;Remote Impaired IQ: Average Insight: Poor Impulse Control: Fair Appetite: Poor Weight Loss: 0  Weight Gain: 75  (in last 6 months plus) Sleep: Decreased Total Hours of Sleep: 5  Vegetative Symptoms: Staying in bed;Not bathing;Decreased grooming (has not bathed in 5 days, will not get out of bed)  Prior Inpatient Therapy Prior Inpatient Therapy: Yes Prior Therapy Dates: 1994, 2013 Prior Therapy Facilty/Provider(s): [facility], [facility] Reason for Treatment: Overdose, Bipolar Disorder  Prior Outpatient Therapy Prior Outpatient Therapy: Yes Prior Therapy Dates: 2013 - current Prior Therapy Facilty/Provider(s): Dr. Sheela Stack office Reason for Treatment: Bipolar Disorder  ADL Screening (condition at time of admission) Patient's cognitive ability adequate to safely complete daily activities?: Yes Patient able to express need for assistance with ADLs?: Yes Independently performs ADLs?: Yes Communication: Independent Dressing (OT): Independent Grooming: Independent Feeding: Independent Bathing: Independent Toileting: Independent In/Out Bed: Independent Walks in Home: Independent Weakness of Legs: None Weakness of Arms/Hands: None  Home Assistive Devices/Equipment Home Assistive Devices/Equipment: None    Abuse/Neglect Assessment (Assessment to be complete while patient is alone) Physical Abuse: Denies Verbal Abuse: Denies Sexual Abuse: Denies Exploitation of patient/patient's resources: Denies Self-Neglect: Denies Values /  Beliefs Cultural Requests During Hospitalization: None Spiritual Requests During Hospitalization: None Consults Spiritual Care Consult Needed: No Social Work Consult Needed: No Merchant navy officer (For Healthcare) Advance Directive: Patient does not have advance directive;Patient would not like information Pre-existing out of facility DNR order (yellow form or pink MOST form): Other (comment) (unknown)    Additional Information 1:1 In Past 12 Months?: No CIRT Risk: No Elopement Risk: No Does patient have medical clearance?: Yes     Disposition:  Disposition Disposition of Patient: Inpatient treatment program (Pt accepted [facility] by Dr. Lolly Mustache) Type of inpatient treatment program: Adult (Pt accepted [facility] by Dr. Lolly Mustache)  On Site Evaluation by:   Reviewed with Physician:  Lolita Lenz, Rennis Harding 09/25/2011 6:30 PM

## 2011-09-25 NOTE — ED Notes (Signed)
Pt belongings all sent home with sister. Only items left is CPAP machine (at desk), cane & glasses.

## 2011-09-25 NOTE — ED Notes (Signed)
Gave pt. coke to drink; sitter at bedside.

## 2011-09-25 NOTE — ED Notes (Signed)
Pt. Transported to [facility] via security, pt. Alert and oriented, NAD noted

## 2011-09-25 NOTE — ED Notes (Signed)
Pt changed to large gowns wanded by security sitter called for charge and house coverage notified

## 2011-09-25 NOTE — ED Notes (Signed)
Reports continued depression few months. Has estranged son x 3 yrs, recently found out he has cancer "but he won't even talk to me. I've tried to contact him many times. He won't have a thing to do with me". States she's "a hermit". Never steps foot out of the house, never goes out anywhere. States is disgusted with her weight gain but realizes that she eats more with her depression. States been having suicidal ideations with a plan for past few days.

## 2011-09-25 NOTE — ED Notes (Signed)
Has been depressed and feels like she wants to hurt self has been in the bed

## 2011-09-25 NOTE — ED Provider Notes (Signed)
History     CSN: [id number]  Arrival date & time 09/25/11  1230   First MD Initiated Contact with Patient 09/25/11 1556      Chief Complaint  Patient presents with  . Suicidal    (Consider location/radiation/quality/duration/timing/severity/associated sxs/prior treatment) HPI Comments: Patient reports worsening depression with associated plan to overdose on pills. She reports that she has a prior history of depression and bipolar disorder and sees a psychiatrist. She reports that she is a "stress eater." She reports that she has been very overweight and that makes her depression worse. She reports that she discuss herself. In addition, she has discovered that her estranged son has developed cancer and he still refuses to associate with her and will not see her. This is made her more depressed as well. She reports that she was admitted at behavioral health clinic in February do to severe depression and her medications were adjusted which was verified by the patient's sister. This did improve her symptoms at the time. She reports some physical symptoms of not sleeping, overeating and she reports that she does occasionally have waves of nausea which she attributes to her anxiety levels. She denies any chest pain, fevers, cough, vomiting or diarrhea.  The history is provided by the patient and a relative.    Past Medical History  Diagnosis Date  . Hyperlipidemia   . CAD (coronary artery disease)   . Bipolar 1 disorder   . Schizophrenia   . GERD (gastroesophageal reflux disease)   . Hyperthyroidism   . Chronic back pain   . Morbid obesity   . Dyspnea     PFT 03/05/09 FEV1 2.77(98%), FVC 3.25(86%), FEV1% 85, TLC 5.88(99%), DLCO 60% ,  Methacholine challenge 03/16/09 normal ,  CT chest 03/12/09 no pulmonary disease  . Anxiety   . Arthritis   . Depression   . OSA on CPAP     2 liters  . HTN (hypertension)   . History of colonoscopy 10/17/2002    by Dr Rehman-> distal non-specific  proctitis, small ext hemorrhoids,   . Fungal infection   . Cellulitis   . Contusion of sacrum   . Migraine headache   . Vitamin d deficiency   . Sleep apnea   . Complication of anesthesia     States she typically gets sick s/p anesthesia  . Hypothyroidism   . Myocardial infarction     NOV 1997    Past Surgical History  Procedure Date  . Cholecystectomy   . Knee arthroscopy   . Appendectomy   . Tonsillectomy   . Back surgery   . Total vaginal hysterectomy   . Cardiac catheterization     nov 1997  . Abdominal hysterectomy     sept 1996  . Joint replacement     bil knee replacement  . Tubal ligation     Family History  Problem Relation Age of Onset  . Cancer Mother   . Hypertension Mother   . Coronary artery disease Father   . Hypertension Brother   . Coronary artery disease Brother   . Anesthesia problems Neg Hx   . Hypotension Neg Hx   . Malignant hyperthermia Neg Hx   . Pseudochol deficiency Neg Hx     History  Substance Use Topics  . Smoking status: Never Smoker   . Smokeless tobacco: Not on file  . Alcohol Use: No    OB History    Grav Para Term Preterm Abortions TAB SAB Ect Mult Living  Review of Systems  Constitutional: Negative for fever and chills.  Respiratory: Negative for shortness of breath.   Cardiovascular: Negative for chest pain.  Gastrointestinal: Positive for nausea. Negative for vomiting, abdominal pain and diarrhea.  Psychiatric/Behavioral: Positive for suicidal ideas, sleep disturbance and self-injury. Negative for hallucinations. The patient is nervous/anxious.   All other systems reviewed and are negative.    Allergies  Ativan; Haldol; and Naproxen  Home Medications   Current Outpatient Rx  Name Route Sig Dispense Refill  . ALBUTEROL SULFATE HFA 108 (90 BASE) MCG/ACT IN AERS Inhalation Inhale 2 puffs into the lungs every 6 (six) hours as needed. For shortness of breath    . ARIPIPRAZOLE 10 MG PO TABS Oral  Take 10 mg by mouth daily.    . ASPIRIN 81 MG PO TBEC Oral Take 81 mg by mouth at bedtime.     . ATORVASTATIN CALCIUM 40 MG PO TABS Oral Take 40 mg by mouth at bedtime.     Marland Kitchen BUTALBITAL-APAP-CAFFEINE 50-325-40 MG PO TABS  Take 1 tablet every 8 hours as needed for headache.  Do not exceed 2 tablets daily. 24 tablet 0  . VITAMIN D 2000 UNITS PO TABS Oral Take 2,000 Units by mouth every morning.     Marland Kitchen DIAZEPAM 5 MG PO TABS Oral Take 1 tablet (5 mg total) by mouth at bedtime. For anxiety 30 tablet 1  . DIVALPROEX SODIUM ER 500 MG PO TB24 Oral Take 2 tablets (1,000 mg total) by mouth at bedtime. 60 tablet 1  . DULOXETINE HCL 60 MG PO CPEP Oral Take 60 mg by mouth every morning.    . FUROSEMIDE 20 MG PO TABS Oral Take 20-40 mg by mouth See admin instructions. Pt ALTERNATES between 20 mg and 40 mg once daily.    Marland Kitchen GABAPENTIN 300 MG PO CAPS Oral Take 300-600 mg by mouth See admin instructions. Take 1 tab in the AM, 2 tabs at noon, 2 tabs at bedtime. For chronic pain and anxiety.    Marland Kitchen LEVOTHYROXINE SODIUM 125 MCG PO TABS Oral Take 125 mcg by mouth every morning.     Marland Kitchen AMITIZA PO Oral Take 1 tablet by mouth 2 (two) times daily.    Marland Kitchen MAGNESIUM CITRATE 1.745 GM/30ML PO SOLN Oral Take 1 Bottle by mouth once. **If bowel movement has not occurred in 3 days, take one bottle on the 4th day as directed**    . ONE-DAILY MULTI VITAMINS PO TABS Oral Take 1 tablet by mouth every morning. Vitamin supplement.    Marland Kitchen NIACIN ER (ANTIHYPERLIPIDEMIC) 500 MG PO TBCR Oral Take 500 mg by mouth every evening.     Marland Kitchen OMEPRAZOLE 20 MG PO CPDR Oral Take 20 mg by mouth 2 (two) times daily. For acid reflux.    Marland Kitchen POLYETHYLENE GLYCOL 3350 PO PACK Oral Take 17 g by mouth 3 (three) times daily.    Marland Kitchen PROMETHAZINE HCL 25 MG PO TABS Oral Take 25 mg by mouth every 6 (six) hours as needed. For nausea    . VERAPAMIL HCL ER (CO) 180 MG PO TB24 Oral Take 180 mg by mouth daily with breakfast.     . ARIPIPRAZOLE 10 MG PO TABS Oral Take 10 mg by mouth  every morning.      BP 137/79  Pulse 79  Temp(Src) 98.4 F (36.9 C) (Oral)  Resp 20  SpO2 97%  Physical Exam  Nursing note and vitals reviewed. Constitutional: She appears well-developed and well-nourished. No distress.  HENT:  Head: Normocephalic.  Eyes: Conjunctivae are normal. Pupils are equal, round, and reactive to light.  Neck: Neck supple.  Cardiovascular: Normal rate.   Pulmonary/Chest: Effort normal.  Abdominal: Soft. She exhibits no distension. There is no tenderness.  Neurological: She is alert.  Skin: Skin is warm and dry. No rash noted.  Psychiatric: Her mood appears not anxious. Her speech is not rapid and/or pressured, not delayed and not tangential. She expresses inappropriate judgment. She does not express impulsivity. She exhibits a depressed mood. She expresses suicidal ideation. She expresses suicidal plans.    ED Course  Procedures (including critical care time)  Labs Reviewed  CBC - Abnormal; Notable for the following:    Platelets 125 (*)    All other components within normal limits  URINE RAPID DRUG SCREEN (HOSP PERFORMED)  ETHANOL  COMPREHENSIVE METABOLIC PANEL   No results found.   1. Depression    8:06 PM Pt uses home CPAP which she brought with her.  She can use as needed.  Per [facility], bed is still pending, but pt can go once it is available.     MDM  I discussed with the Oncologist. She informs me that the patient is accepted by Dr. Lolly Mustache at [facility] who has accepted her pending bed availability.  Psych holding orders are placed.  The patient is medically cleared and can follow up as outpatient for any further medical issues.          Gavin Pound. Oletta Lamas, MD 09/25/11 2007

## 2011-09-26 ENCOUNTER — Encounter (HOSPITAL_COMMUNITY): Payer: Self-pay | Admitting: *Deleted

## 2011-09-26 DIAGNOSIS — F259 Schizoaffective disorder, unspecified: Principal | ICD-10-CM

## 2011-09-26 DIAGNOSIS — F4310 Post-traumatic stress disorder, unspecified: Secondary | ICD-10-CM

## 2011-09-26 LAB — VALPROIC ACID LEVEL: Valproic Acid Lvl: 71.4 ug/mL (ref 50.0–100.0)

## 2011-09-26 LAB — TSH: TSH: 4.808 u[IU]/mL — ABNORMAL HIGH (ref 0.350–4.500)

## 2011-09-26 MED ORDER — ALUM & MAG HYDROXIDE-SIMETH 200-200-20 MG/5ML PO SUSP
30.0000 mL | ORAL | Status: DC | PRN
  Administered 2011-09-27: 30 mL via ORAL

## 2011-09-26 MED ORDER — ACETAMINOPHEN 325 MG PO TABS
650.0000 mg | ORAL_TABLET | Freq: Four times a day (QID) | ORAL | Status: DC | PRN
  Administered 2011-09-27 – 2011-10-02 (×4): 650 mg via ORAL
  Filled 2011-09-26: qty 2

## 2011-09-26 MED ORDER — PANTOPRAZOLE SODIUM 40 MG PO TBEC
40.0000 mg | DELAYED_RELEASE_TABLET | Freq: Every day | ORAL | Status: DC
  Administered 2011-09-26 – 2011-10-03 (×8): 40 mg via ORAL
  Filled 2011-09-26 (×8): qty 1

## 2011-09-26 MED ORDER — FUROSEMIDE 40 MG PO TABS
40.0000 mg | ORAL_TABLET | Freq: Every day | ORAL | Status: DC
  Administered 2011-09-26: 40 mg via ORAL
  Filled 2011-09-26 (×2): qty 1

## 2011-09-26 MED ORDER — LEVOTHYROXINE SODIUM 125 MCG PO TABS
125.0000 ug | ORAL_TABLET | Freq: Every day | ORAL | Status: DC
  Administered 2011-09-27 – 2011-10-03 (×7): 125 ug via ORAL
  Filled 2011-09-26 (×7): qty 1
  Filled 2011-09-26: qty 7
  Filled 2011-09-26: qty 1

## 2011-09-26 MED ORDER — FUROSEMIDE 20 MG PO TABS
20.0000 mg | ORAL_TABLET | ORAL | Status: DC
  Administered 2011-09-27 – 2011-10-03 (×4): 20 mg via ORAL
  Filled 2011-09-26 (×4): qty 1

## 2011-09-26 MED ORDER — FUROSEMIDE 20 MG PO TABS
20.0000 mg | ORAL_TABLET | Freq: Every day | ORAL | Status: DC
  Filled 2011-09-26 (×2): qty 1

## 2011-09-26 MED ORDER — SUMATRIPTAN SUCCINATE 6 MG/0.5ML ~~LOC~~ SOLN
6.0000 mg | SUBCUTANEOUS | Status: DC | PRN
  Filled 2011-09-26: qty 0.5

## 2011-09-26 MED ORDER — ASPIRIN EC 81 MG PO TBEC
81.0000 mg | DELAYED_RELEASE_TABLET | Freq: Every day | ORAL | Status: DC
  Administered 2011-09-26 – 2011-10-03 (×8): 81 mg via ORAL
  Filled 2011-09-26 (×6): qty 1
  Filled 2011-09-26: qty 7
  Filled 2011-09-26 (×3): qty 1

## 2011-09-26 MED ORDER — DICLOFENAC SODIUM 1 % TD GEL
Freq: Four times a day (QID) | TRANSDERMAL | Status: DC
  Administered 2011-09-26 – 2011-10-02 (×17): via TOPICAL
  Administered 2011-10-02 (×2): 1 via TOPICAL
  Administered 2011-10-02: via TOPICAL
  Filled 2011-09-26: qty 100

## 2011-09-26 MED ORDER — ALBUTEROL SULFATE HFA 108 (90 BASE) MCG/ACT IN AERS: 2.0000 | INHALATION_SPRAY | RESPIRATORY_TRACT | Status: DC | PRN

## 2011-09-26 MED ORDER — ATORVASTATIN CALCIUM 40 MG PO TABS
40.0000 mg | ORAL_TABLET | Freq: Every day | ORAL | Status: DC
  Administered 2011-09-26 – 2011-10-02 (×7): 40 mg via ORAL
  Filled 2011-09-26 (×6): qty 1
  Filled 2011-09-26: qty 7
  Filled 2011-09-26 (×2): qty 1

## 2011-09-26 MED ORDER — NIACIN 500 MG PO TABS
500.0000 mg | ORAL_TABLET | Freq: Every day | ORAL | Status: DC
Start: 2011-09-26 — End: 2011-09-26
  Filled 2011-09-26: qty 1

## 2011-09-26 MED ORDER — MAGNESIUM HYDROXIDE 400 MG/5ML PO SUSP: 30.0000 mL | Freq: Every day | ORAL | Status: DC | PRN

## 2011-09-26 MED ORDER — NIACIN ER 500 MG PO CPCR
500.0000 mg | ORAL_CAPSULE | Freq: Every day | ORAL | Status: DC
  Administered 2011-09-26 – 2011-10-02 (×7): 500 mg via ORAL
  Filled 2011-09-26 (×4): qty 1
  Filled 2011-09-26: qty 7
  Filled 2011-09-26 (×4): qty 1

## 2011-09-26 MED ORDER — VERAPAMIL HCL ER 180 MG PO TBCR
180.0000 mg | EXTENDED_RELEASE_TABLET | Freq: Every day | ORAL | Status: DC
  Administered 2011-09-26 – 2011-10-03 (×8): 180 mg via ORAL
  Filled 2011-09-26 (×3): qty 1
  Filled 2011-09-26: qty 7
  Filled 2011-09-26 (×6): qty 1

## 2011-09-26 MED ORDER — GABAPENTIN 300 MG PO CAPS
300.0000 mg | ORAL_CAPSULE | Freq: Every day | ORAL | Status: DC
  Administered 2011-09-27 – 2011-10-03 (×7): 300 mg via ORAL
  Filled 2011-09-26: qty 1
  Filled 2011-09-26: qty 35
  Filled 2011-09-26 (×7): qty 1

## 2011-09-26 MED ORDER — POLYETHYLENE GLYCOL 3350 17 G PO PACK
17.0000 g | PACK | Freq: Three times a day (TID) | ORAL | Status: DC
  Administered 2011-09-26 – 2011-10-01 (×15): 17 g via ORAL
  Filled 2011-09-26 (×3): qty 1
  Filled 2011-09-26: qty 9
  Filled 2011-09-26 (×9): qty 1
  Filled 2011-09-26: qty 9
  Filled 2011-09-26 (×4): qty 1
  Filled 2011-09-26: qty 9
  Filled 2011-09-26 (×9): qty 1

## 2011-09-26 MED ORDER — FUROSEMIDE 40 MG PO TABS
40.0000 mg | ORAL_TABLET | ORAL | Status: DC
  Administered 2011-09-26 – 2011-10-02 (×4): 40 mg via ORAL
  Filled 2011-09-26 (×5): qty 1

## 2011-09-26 MED ORDER — GABAPENTIN 300 MG PO CAPS
600.0000 mg | ORAL_CAPSULE | Freq: Two times a day (BID) | ORAL | Status: DC
  Administered 2011-09-26 – 2011-09-27 (×2): 600 mg via ORAL
  Filled 2011-09-26 (×6): qty 2

## 2011-09-26 MED ORDER — VITAMIN D3 25 MCG (1000 UNIT) PO TABS
2000.0000 [IU] | ORAL_TABLET | Freq: Every day | ORAL | Status: DC
  Administered 2011-09-26 – 2011-10-03 (×8): 2000 [IU] via ORAL
  Filled 2011-09-26 (×9): qty 2
  Filled 2011-09-26: qty 14
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
## 2011-09-26 NOTE — Progress Notes (Addendum)
[facility] Group Notes: (Counselor/Nursing/MHT/Case Management/Adjunct)  09/26/2011 @ 11:00  Feelings About Diagnosis  Type of Therapy: Group Therapy   Participation Level: Minimal   Participation Quality: Attentive   Affect: Blunted   Cognitive: Appropriate   Insight: None   Engagement in Group: Minimal   Engagement in Therapy: None   Modes of Intervention: Support and Exploration   Summary of Progress/Problems: Trysta was attentive but not personally engaged in group process.    Billie Lade  09/26/2011 12:19 PM     [facility] Group Notes: (Counselor/Nursing/MHT/Case Management/Adjunct) 09/26/2011   @1 :15pm Breathing & Meditation for Anxiety/Anger   Type of Therapy:  Group Therapy  Participation Level:  Active  Participation Quality: Attentive, Sharing, Appropriate    Affect:  Appropriate  Cognitive:  Appropriate  Insight:  Good  Engagement in Group: Good  Engagement in Therapy: Good  Modes of Intervention:  Support and Exploration  Summary of Progress/Problems: Rachal took part in focused breathing and progressive muscle relaxation exercises. She processed her experience, saying that they helped her relax and are something she can incorporate into everyday life.  Adlai requested a copy of the cd for home use.  Billie Lade 09/26/2011 4:15 PM

## 2011-09-26 NOTE — Tx Team (Signed)
Interdisciplinary Treatment Plan Update (Adult)  Date:  09/26/2011  Time Reviewed:  10:40 AM   Progress in Treatment: Attending groups:   Yes   Participating in groups:  Yes Taking medication as prescribed:  Yes Tolerating medication:  Yes Family/Significant othe contact made: Patient to assess for collateral contact Patient understands diagnosis:  Yes Discussing patient identified problems/goals with staff: Yes Medical problems stabilized or resolved: Yes Denies suicidal/homicidal ideation:No.  Endorses SI but contracts for safety on the milieu Issues/concerns per patient self-inventory:  Other:  New problem(s) identified:  Reason for Continuation of Hospitalization: Anxiety Depression Medication stabilization Suicidal ideation  Interventions implemented related to continuation of hospitalization:  Medication Management; safety checks q 15 mins  Additional comments:  Estimated length of stay: 3-5 days  Discharge Plan:  Home with follow up with [facility] [place], [place]  New goal(s):  Review of initial/current patient goals per problem list:    1.  Goal(s):  Eliminate SI/thoughts of self harm  Met:  No  Target date: d/c  As evidenced [id number] will no longer endorse SI/thoughts of self harm  2.  Goal (s):  Reduce depression/hopelessness/helplessness (all rated at ten today  Met:  No  Target date: d/c  As evidenced by:  Patient will rate symptoms at four or below  3.  Goal(s):  Reduce anxiety (currently rated at eight)  Met:  No  Target date: d/c  As evidenced by: Patient will rate anxiety at four or below  4.  Goal(s):  Stabilize on medications  Met:  No  Target date: d/c  As evidenced by:  Patient will advised medications are working - symptoms have improved  Attendees: Patient:     Family:     Physician:  Orson Aloe, MD 09/26/2011 10:40 AM   Nursing:   Cammy Brochure, RN 09/26/2011 10:40 AM   CaseManager:  Juline Patch, LCSW  09/26/2011 10:40 AM   Counselor:  [name], LCSW 09/26/2011 10:40 AM   Other:  Faythe Dingwall, RN 09/26/2011 10:40 AM   Other:  Reyes Ivan, LCSWA 09/26/2011  10:40 AM   Other:     Other:      Scribe for Treatment Team:   Wynn Banker, LCSW,  09/26/2011 10:40 AM

## 2011-09-26 NOTE — Progress Notes (Signed)
Adult Psychosocial Assessment Update Interdisciplinary Team  Previous [facility] admissions/discharges:  Admissions Discharges  Date:  06/28/11 Date:    07/03/11  Date: Date:  Date: Date:  Date: Date:  Date: Date:   Changes since the last Psychosocial Assessment (including adherence to outpatient mental health and/or substance abuse treatment, situational issues contributing to decompensation and/or relapse). Allison Sharp reports that she had developed a plan of suicide, but sister found out about it and   Brought her to the hospital. Has a son she is estranged from and has learned that he has  Terminal cancer. He refuses to see her. She believes nothing is going right and every-  Thing she touches turns to destruction for the last 3-4 weeks since she learned about   Son's cancer. Has not spoken to him for 5-7 years.      Discharge Plan 1. Will you be returning to the same living situation after discharge?   Yes:     X No:      If no, what is your plan?    Lives with nephew, sister is about 10 minutes away. Environment is good, but she is   Considering moving to senior living apartment so that she can be by Allison Sharp Allison Sharp her   Own space   2. Would you like a referral for services when you are discharged? Yes:     If yes, for what services?  No:   X     Dr Allison Sharp fo medications and Dr. Kieth Sharp for therapy       Summary and Recommendations (to be completed by the evaluator) Allison Sharp is a 56 year old divorced female diagnosed with Bipolar Disorder, Depressed.  She reports that her church family is supportive to her and her support group there can   Help her get through this time, although she has been isolating and not going to church  Much lately. She would like to be able to access support, but also does not want to get  Out of bed and do anything differently.Reports she has another son but in the [employer] and stationed far away so they don't talk much.  Allison Sharp would benefit  from crisis stabilization,   Medication evaluation, therapy groups for processing thoughts/feelings/experiences,   psychoed groups for coping skills and case management for discharge planning.            Signature:  Allison Sharp, 09/26/2011 8:31 AM

## 2011-09-26 NOTE — Progress Notes (Signed)
[facility] MD Progress Note  09/26/2011 6:13 PM  Diagnosis:  Axis I: Post Traumatic Stress Disorder and Schizoaffective Disorder  ADL's:  Intact  Sleep: Fair  Appetite:  Fair  Suicidal Ideation:  Pt denies any suicidal ideation. Homicidal Ideation:  Denies adamantly any homicidal thoughts.  Mental Status Examination/Evaluation: Objective:  Appearance: Casual  Eye Contact::  Good  Speech:  Clear and Coherent  Volume:  Normal  Mood:  Dysphoric  Affect:  Congruent  Thought Process:  Coherent  Orientation:  Full  Thought Content:  WDL  Suicidal Thoughts:  No  Homicidal Thoughts:  No  Memory:  Immediate;   Fair  Judgement:  Fair  Insight:  Fair  Psychomotor Activity:  Normal  Concentration:  Fair  Recall:  Fair  Akathisia:  No  AIMS (if indicated):     Assets:  Communication Skills Desire for Improvement  Sleep:  Number of Hours: 4.5    Vital Signs:Blood pressure 123/87, pulse 92, temperature 97.3 F (36.3 C), temperature source Oral, resp. rate 20, height 5\' 9"  (1.753 m), weight 181.892 kg (401 lb). Current Medications: Current Facility-Administered Medications  Medication Dose Route Frequency Provider Last Rate Last Dose  . acetaminophen (TYLENOL) tablet 650 mg  650 mg Oral Q6H PRN Mickeal Skinner, MD      . albuterol (PROVENTIL HFA;VENTOLIN HFA) 108 (90 BASE) MCG/ACT inhaler 2 puff  2 puff Inhalation Q4H PRN Sanjuana Kava, NP      . alum & mag hydroxide-simeth (MAALOX/MYLANTA) 200-200-20 MG/5ML suspension 30 mL  30 mL Oral Q4H PRN Mickeal Skinner, MD      . aspirin EC tablet 81 mg  81 mg Oral Daily Sanjuana Kava, NP   81 mg at 09/26/11 1630  . atorvastatin (LIPITOR) tablet 40 mg  40 mg Oral q1800 Sanjuana Kava, NP   40 mg at 09/26/11 1711  . cholecalciferol (VITAMIN D) tablet 2,000 Units  2,000 Units Oral Daily Sanjuana Kava, NP   2,000 Units at 09/26/11 1629  . diclofenac sodium (VOLTAREN) 1 % transdermal gel   Topical QID Mike Craze, MD      . furosemide (LASIX) tablet  40 mg  40 mg Oral QODAY Mike Craze, MD   40 mg at 09/26/11 1635   And  . furosemide (LASIX) tablet 20 mg  20 mg Oral QODAY Mike Craze, MD      . gabapentin (NEURONTIN) capsule 300 mg  300 mg Oral QAC breakfast Sanjuana Kava, NP      . gabapentin (NEURONTIN) capsule 600 mg  600 mg Oral BID Sanjuana Kava, NP   600 mg at 09/26/11 1628  . levothyroxine (SYNTHROID, LEVOTHROID) tablet 125 mcg  125 mcg Oral QAC breakfast Sanjuana Kava, NP      . magnesium hydroxide (MILK OF MAGNESIA) suspension 30 mL  30 mL Oral Daily PRN Mickeal Skinner, MD      . niacin tablet 500 mg  500 mg Oral QHS Sanjuana Kava, NP      . pantoprazole (PROTONIX) EC tablet 40 mg  40 mg Oral Q1200 Sanjuana Kava, NP   40 mg at 09/26/11 1630  . polyethylene glycol (MIRALAX / GLYCOLAX) packet 17 g  17 g Oral TID Mike Craze, MD   17 g at 09/26/11 1711  . SUMAtriptan (IMITREX) injection 6 mg  6 mg Subcutaneous Q2H PRN Mike Craze, MD      . verapamil (CALAN-SR) CR tablet 180 mg  180 mg Oral Daily Sanjuana Kava, NP   180 mg at 09/26/11 1628  . DISCONTD: furosemide (LASIX) tablet 20 mg  20 mg Oral Daily Sanjuana Kava, NP      . DISCONTD: furosemide (LASIX) tablet 40 mg  40 mg Oral Daily Sanjuana Kava, NP   40 mg at 09/26/11 1629   Facility-Administered Medications Ordered in Other Encounters  Medication Dose Route Frequency Provider Last Rate Last Dose  . DISCONTD: acetaminophen (TYLENOL) tablet 650 mg  650 mg Oral Q4H PRN Gavin Pound. Ghim, MD      . DISCONTD: acetaminophen (TYLENOL) tablet 650 mg  650 mg Oral Q6H PRN Mickeal Skinner, MD      . DISCONTD: albuterol (PROVENTIL HFA;VENTOLIN HFA) 108 (90 BASE) MCG/ACT inhaler 2 puff  2 puff Inhalation Q6H PRN Mickeal Skinner, MD      . DISCONTD: ALPRAZolam Prudy Feeler) tablet 0.5 mg  0.5 mg Oral TID PRN Gavin Pound. Ghim, MD   0.5 mg at 09/25/11 2055  . DISCONTD: alum & mag hydroxide-simeth (MAALOX/MYLANTA) 200-200-20 MG/5ML suspension 30 mL  30 mL Oral Q4H PRN Mickeal Skinner, MD      .  DISCONTD: ARIPiprazole (ABILIFY) tablet 15 mg  15 mg Oral Daily Mickeal Skinner, MD      . DISCONTD: ARIPiprazole (ABILIFY) tablet 15 mg  15 mg Oral Daily Mickeal Skinner, MD      . DISCONTD: aspirin 81 MG chewable tablet           . DISCONTD: aspirin EC tablet 81 mg  81 mg Oral QHS Mickeal Skinner, MD   81 mg at 09/25/11 2255  . DISCONTD: atorvastatin (LIPITOR) tablet 40 mg  40 mg Oral QHS Mickeal Skinner, MD   40 mg at 09/25/11 2258  . DISCONTD: benztropine (COGENTIN) tablet 1 mg  1 mg Oral BID PRN Mickeal Skinner, MD      . DISCONTD: benztropine (COGENTIN) tablet 1 mg  1 mg Oral BID PRN Mickeal Skinner, MD      . DISCONTD: butalbital-acetaminophen-caffeine (FIORICET, ESGIC) 50-325-40 MG per tablet 1 tablet  1 tablet Oral Q8H PRN Mickeal Skinner, MD      . DISCONTD: cholecalciferol (VITAMIN D) tablet 2,000 Units  2,000 Units Oral Daily Mickeal Skinner, MD      . DISCONTD: diazepam (VALIUM) tablet 5 mg  5 mg Oral QHS PRN Mickeal Skinner, MD      . DISCONTD: divalproex (DEPAKOTE ER) 24 hr tablet 1,000 mg  1,000 mg Oral QHS Mickeal Skinner, MD   1,000 mg at 09/25/11 2257  . DISCONTD: DULoxetine (CYMBALTA) DR capsule 60 mg  60 mg Oral Daily Mickeal Skinner, MD      . DISCONTD: furosemide (LASIX) tablet 20 mg  20 mg Oral QODAY Mickeal Skinner, MD      . DISCONTD: furosemide (LASIX) tablet 40 mg  40 mg Oral QODAY Mickeal Skinner, MD      . DISCONTD: gabapentin (NEURONTIN) capsule 300 mg  300 mg Oral QAC breakfast Mickeal Skinner, MD      . DISCONTD: gabapentin (NEURONTIN) tablet 600 mg  600 mg Oral BID Mickeal Skinner, MD   600 mg at 09/25/11 2258  . DISCONTD: ibuprofen (ADVIL,MOTRIN) tablet 600 mg  600 mg Oral Q8H PRN Gavin Pound. Ghim, MD      . DISCONTD: levothyroxine (SYNTHROID, LEVOTHROID) tablet 125 mcg  125 mcg Oral QAC breakfast Mickeal Skinner, MD      . DISCONTD: lubiprostone (AMITIZA) capsule 8 mcg  8 mcg Oral BID  WC Mickeal Skinner, MD      . DISCONTD: magnesium citrate solution 1 Bottle  1 Bottle Oral Daily  PRN Mickeal Skinner, MD      . DISCONTD: magnesium hydroxide (MILK OF MAGNESIA) suspension 30 mL  30 mL Oral Daily PRN Mickeal Skinner, MD      . DISCONTD: mulitivitamin with minerals tablet 1 tablet  1 tablet Oral Daily Mickeal Skinner, MD      . DISCONTD: niacin CR capsule 500 mg  500 mg Oral q1800 Mickeal Skinner, MD      . DISCONTD: ondansetron (ZOFRAN) tablet 4 mg  4 mg Oral Q8H PRN Gavin Pound. Ghim, MD      . DISCONTD: pantoprazole (PROTONIX) EC tablet 80 mg  80 mg Oral Q1200 Mickeal Skinner, MD   80 mg at 09/25/11 2254  . DISCONTD: polyethylene glycol (MIRALAX / GLYCOLAX) packet 17 g  17 g Oral TID Mickeal Skinner, MD   17 g at 09/25/11 2258  . DISCONTD: promethazine (PHENERGAN) tablet 25 mg  25 mg Oral Q6H PRN Mickeal Skinner, MD      . DISCONTD: verapamil (CALAN-SR) CR tablet 180 mg  180 mg Oral Daily Mickeal Skinner, MD        Lab Results:  Results for orders placed during the hospital encounter of 09/25/11 (from the past 48 hour(s))  TSH     Status: Abnormal   Collection Time   09/26/11  6:15 AM      Component Value Range Comment   TSH 4.808 (*) 0.350 - 4.500 (uIU/mL)   VALPROIC ACID LEVEL     Status: Normal   Collection Time   09/26/11  6:15 AM      Component Value Range Comment   Valproic Acid Lvl 71.4  50.0 - 100.0 (ug/mL)     Physical Findings: AIMS:  , ,  ,  ,    CIWA:    COWS:     Treatment Plan Summary: Daily contact with patient to assess and evaluate symptoms and progress in treatment Medication management  Plan: Admit. Push Neurontin and continue Cymbalta, Lasix, and Synthroid. Kewana Sanon 09/26/2011, 6:13 PM

## 2011-09-26 NOTE — Progress Notes (Signed)
Pt. Has been up & about --ambulating with a cane.Pt. Uses c-pap with concentrator  when in bed.Pt. Was encouraged to attend AA & NA.Pt. Was anxious about her medications but calmed down after they were straightened out.Pt. Seemed surprised that she did not have more medications @ [facility] & stated "I will need to talk to the Dr in the AM."Pt. Has been very pleasant & cooperative.Continues on 15 minute checks. Pt. Safety maintained.

## 2011-09-26 NOTE — Progress Notes (Signed)
Patient seen during discharge planning group.  She reports admitting to hospital due to [facility] with plan.  She shared sister found out and had her admitted to the hospital.  Patient continues to endorse SI but contracts for safety.  She advised that her son from whom she has been estranged for seven years is dying of cancer and will not allow her to make contact with him.  Patient shared that she feels everything in her life is going bad.  She also expressed concern with her weight gain.  She reports having home, transportation, and access to meds.  She is followed outpatient by the [facility] in [place].  She advised that she no longer wants to see Dr. Arlina Robes at discharge.  Patient is open to being see for female therapist at the practice.

## 2011-09-26 NOTE — Progress Notes (Signed)
Did not complete self inventory sheet, has not had any meds since admission, explained to herMD would be seeing her and orders would be written, needs her home meds she states, has SI thoughts, she is depressed and anxious Going to DR for meals and attending group q5min safety checks continue and support offered safety maintained

## 2011-09-26 NOTE — Tx Team (Signed)
Initial Interdisciplinary Treatment Plan  PATIENT STRENGTHS: (choose at least two) Ability for insight Average or above average intelligence Capable of independent living Communication skills General fund of knowledge Motivation for treatment/growth Supportive family/friends  PATIENT STRESSORS: Financial difficulties Health problems Marital or family conflict   PROBLEM LIST: Problem List/Patient Goals Date to be addressed Date deferred Reason deferred Estimated date of resolution  Depression      Anxiety      Suicidal thoughts                                           DISCHARGE CRITERIA:  Ability to meet basic life and health needs Adequate post-discharge living arrangements Improved stabilization in mood, thinking, and/or behavior Medical problems require only outpatient monitoring Motivation to continue treatment in a less acute level of care Need for constant or close observation no longer present Reduction of life-threatening or endangering symptoms to within safe limits Safe-care adequate arrangements made Verbal commitment to aftercare and medication compliance  PRELIMINARY DISCHARGE PLAN: Outpatient therapy Return to previous living arrangement Return to previous work or school arrangements  PATIENT/FAMIILY INVOLVEMENT: This treatment plan has been presented to and reviewed with the patient, Allison Sharp.  The patient has been given the opportunity to ask questions and make suggestions.  [name] 09/26/2011, 12:41 AM

## 2011-09-26 NOTE — [facility] Suicide Risk Assessment (Addendum)
Suicide Risk Assessment  Admission Assessment     Demographic factors:  Assessment Details Time of Assessment: Admission Information Obtained From: Patient Current Mental Status:  Current Mental Status: Suicidal ideation indicated by patient Loss Factors:  Loss Factors: Loss of significant relationship;Decline in physical health;Financial problems / change in socioeconomic status Historical Factors:  Historical Factors: Prior suicide attempts Risk Reduction Factors:  Risk Reduction Factors: Sense of responsibility to family;Positive coping skills or problem solving skills;Positive social support  CLINICAL FACTORS:   Schizophrenia:   Paranoid or undifferentiated type Chronic Pain Previous Psychiatric Diagnoses and Treatments  COGNITIVE FEATURES THAT CONTRIBUTE TO RISK:  Thought constriction (tunnel vision)    SUICIDE RISK:   Moderate:  Frequent suicidal ideation with limited intensity, and duration, some specificity in terms of plans, no associated intent, good self-control, limited dysphoria/symptomatology, some risk factors present, and identifiable protective factors, including available and accessible social support.  Reason for hospitalization: .seeing and hearing things, seeing herself laying dead in the casket  Diagnosis:  Axis I: Post Traumatic Stress Disorder and Schizoaffective Disorder  ADL's:  Intact  Sleep: Fair  Appetite:  Fair  Suicidal Ideation:  Pt denies any suicidal ideation. Homicidal Ideation:  Denies adamantly any homicidal thoughts.  Mental Status Examination/Evaluation: Objective:  Appearance: Casual  Eye Contact::  Good  Speech:  Clear and Coherent  Volume:  Normal  Mood:  Dysphoric  Affect:  Congruent  Thought Process:  Coherent  Orientation:  Full  Thought Content:  WDL  Suicidal Thoughts:  No  Homicidal Thoughts:  No  Memory:  Immediate;   Fair  Judgement:  Fair  Insight:  Fair  Psychomotor Activity:  Normal  Concentration:  Fair    Recall:  Fair  Akathisia:  No  AIMS (if indicated):     Assets:  Communication Skills Desire for Improvement  Sleep:  Number of Hours: 4.5    Vital Signs:Blood pressure 123/87, pulse 92, temperature 97.3 F (36.3 C), temperature source Oral, resp. rate 20, height 5\' 9"  (1.753 m), weight 181.892 kg (401 lb). Current Medications: Current Facility-Administered Medications  Medication Dose Route Frequency Provider Last Rate Last Dose  . acetaminophen (TYLENOL) tablet 650 mg  650 mg Oral Q6H PRN Mickeal Skinner, MD      . albuterol (PROVENTIL HFA;VENTOLIN HFA) 108 (90 BASE) MCG/ACT inhaler 2 puff  2 puff Inhalation Q4H PRN Sanjuana Kava, NP      . alum & mag hydroxide-simeth (MAALOX/MYLANTA) 200-200-20 MG/5ML suspension 30 mL  30 mL Oral Q4H PRN Mickeal Skinner, MD      . aspirin EC tablet 81 mg  81 mg Oral Daily Sanjuana Kava, NP   81 mg at 09/26/11 1630  . atorvastatin (LIPITOR) tablet 40 mg  40 mg Oral q1800 Sanjuana Kava, NP   40 mg at 09/26/11 1711  . cholecalciferol (VITAMIN D) tablet 2,000 Units  2,000 Units Oral Daily Sanjuana Kava, NP   2,000 Units at 09/26/11 1629  . diclofenac sodium (VOLTAREN) 1 % transdermal gel   Topical QID Mike Craze, MD      . furosemide (LASIX) tablet 40 mg  40 mg Oral QODAY Mike Craze, MD   40 mg at 09/26/11 1635   And  . furosemide (LASIX) tablet 20 mg  20 mg Oral QODAY Mike Craze, MD      . gabapentin (NEURONTIN) capsule 300 mg  300 mg Oral QAC breakfast Sanjuana Kava, NP      . gabapentin (  NEURONTIN) capsule 600 mg  600 mg Oral BID Sanjuana Kava, NP   600 mg at 09/26/11 1628  . levothyroxine (SYNTHROID, LEVOTHROID) tablet 125 mcg  125 mcg Oral QAC breakfast Sanjuana Kava, NP      . magnesium hydroxide (MILK OF MAGNESIA) suspension 30 mL  30 mL Oral Daily PRN Mickeal Skinner, MD      . niacin tablet 500 mg  500 mg Oral QHS Sanjuana Kava, NP      . pantoprazole (PROTONIX) EC tablet 40 mg  40 mg Oral Q1200 Sanjuana Kava, NP   40 mg at 09/26/11 1630   . polyethylene glycol (MIRALAX / GLYCOLAX) packet 17 g  17 g Oral TID Mike Craze, MD   17 g at 09/26/11 1711  . SUMAtriptan (IMITREX) injection 6 mg  6 mg Subcutaneous Q2H PRN Mike Craze, MD      . verapamil (CALAN-SR) CR tablet 180 mg  180 mg Oral Daily Sanjuana Kava, NP   180 mg at 09/26/11 1628  . DISCONTD: furosemide (LASIX) tablet 20 mg  20 mg Oral Daily Sanjuana Kava, NP      . DISCONTD: furosemide (LASIX) tablet 40 mg  40 mg Oral Daily Sanjuana Kava, NP   40 mg at 09/26/11 1629   Facility-Administered Medications Ordered in Other Encounters  Medication Dose Route Frequency Provider Last Rate Last Dose  . DISCONTD: acetaminophen (TYLENOL) tablet 650 mg  650 mg Oral Q4H PRN Gavin Pound. Ghim, MD      . DISCONTD: acetaminophen (TYLENOL) tablet 650 mg  650 mg Oral Q6H PRN Mickeal Skinner, MD      . DISCONTD: albuterol (PROVENTIL HFA;VENTOLIN HFA) 108 (90 BASE) MCG/ACT inhaler 2 puff  2 puff Inhalation Q6H PRN Mickeal Skinner, MD      . DISCONTD: ALPRAZolam Prudy Feeler) tablet 0.5 mg  0.5 mg Oral TID PRN Gavin Pound. Ghim, MD   0.5 mg at 09/25/11 2055  . DISCONTD: alum & mag hydroxide-simeth (MAALOX/MYLANTA) 200-200-20 MG/5ML suspension 30 mL  30 mL Oral Q4H PRN Mickeal Skinner, MD      . DISCONTD: ARIPiprazole (ABILIFY) tablet 15 mg  15 mg Oral Daily Mickeal Skinner, MD      . DISCONTD: ARIPiprazole (ABILIFY) tablet 15 mg  15 mg Oral Daily Mickeal Skinner, MD      . DISCONTD: aspirin 81 MG chewable tablet           . DISCONTD: aspirin EC tablet 81 mg  81 mg Oral QHS Mickeal Skinner, MD   81 mg at 09/25/11 2255  . DISCONTD: atorvastatin (LIPITOR) tablet 40 mg  40 mg Oral QHS Mickeal Skinner, MD   40 mg at 09/25/11 2258  . DISCONTD: benztropine (COGENTIN) tablet 1 mg  1 mg Oral BID PRN Mickeal Skinner, MD      . DISCONTD: benztropine (COGENTIN) tablet 1 mg  1 mg Oral BID PRN Mickeal Skinner, MD      . DISCONTD: butalbital-acetaminophen-caffeine (FIORICET, ESGIC) 50-325-40 MG per tablet 1 tablet  1  tablet Oral Q8H PRN Mickeal Skinner, MD      . DISCONTD: cholecalciferol (VITAMIN D) tablet 2,000 Units  2,000 Units Oral Daily Mickeal Skinner, MD      . DISCONTD: diazepam (VALIUM) tablet 5 mg  5 mg Oral QHS PRN Mickeal Skinner, MD      . DISCONTD: divalproex (DEPAKOTE ER) 24 hr tablet 1,000 mg  1,000 mg Oral QHS Mickeal Skinner, MD   1,000 mg  at 09/25/11 2257  . DISCONTD: DULoxetine (CYMBALTA) DR capsule 60 mg  60 mg Oral Daily Mickeal Skinner, MD      . DISCONTD: furosemide (LASIX) tablet 20 mg  20 mg Oral QODAY Mickeal Skinner, MD      . DISCONTD: furosemide (LASIX) tablet 40 mg  40 mg Oral QODAY Mickeal Skinner, MD      . DISCONTD: gabapentin (NEURONTIN) capsule 300 mg  300 mg Oral QAC breakfast Mickeal Skinner, MD      . DISCONTD: gabapentin (NEURONTIN) tablet 600 mg  600 mg Oral BID Mickeal Skinner, MD   600 mg at 09/25/11 2258  . DISCONTD: ibuprofen (ADVIL,MOTRIN) tablet 600 mg  600 mg Oral Q8H PRN Gavin Pound. Ghim, MD      . DISCONTD: levothyroxine (SYNTHROID, LEVOTHROID) tablet 125 mcg  125 mcg Oral QAC breakfast Mickeal Skinner, MD      . DISCONTD: lubiprostone (AMITIZA) capsule 8 mcg  8 mcg Oral BID WC Mickeal Skinner, MD      . DISCONTD: magnesium citrate solution 1 Bottle  1 Bottle Oral Daily PRN Mickeal Skinner, MD      . DISCONTD: magnesium hydroxide (MILK OF MAGNESIA) suspension 30 mL  30 mL Oral Daily PRN Mickeal Skinner, MD      . DISCONTD: mulitivitamin with minerals tablet 1 tablet  1 tablet Oral Daily Mickeal Skinner, MD      . DISCONTD: niacin CR capsule 500 mg  500 mg Oral q1800 Mickeal Skinner, MD      . DISCONTD: ondansetron (ZOFRAN) tablet 4 mg  4 mg Oral Q8H PRN Gavin Pound. Ghim, MD      . DISCONTD: pantoprazole (PROTONIX) EC tablet 80 mg  80 mg Oral Q1200 Mickeal Skinner, MD   80 mg at 09/25/11 2254  . DISCONTD: polyethylene glycol (MIRALAX / GLYCOLAX) packet 17 g  17 g Oral TID Mickeal Skinner, MD   17 g at 09/25/11 2258  . DISCONTD: promethazine (PHENERGAN) tablet 25 mg  25 mg Oral  Q6H PRN Mickeal Skinner, MD      . DISCONTD: verapamil (CALAN-SR) CR tablet 180 mg  180 mg Oral Daily Mickeal Skinner, MD        Lab Results:  Results for orders placed during the hospital encounter of 09/25/11 (from the past 48 hour(s))  TSH     Status: Abnormal   Collection Time   09/26/11  6:15 AM      Component Value Range Comment   TSH 4.808 (*) 0.350 - 4.500 (uIU/mL)   VALPROIC ACID LEVEL     Status: Normal   Collection Time   09/26/11  6:15 AM      Component Value Range Comment   Valproic Acid Lvl 71.4  50.0 - 100.0 (ug/mL)     Physical Findings: AIMS:  , ,  ,  ,    CIWA:    COWS:     Admit. Push Neurontin and continue Cymbalta, Lasix, and Synthroid.  Risk of harm to self is elevated by her past behavior, her diagnoses, and her past addictive behavior  Risk of harm to others is minimal in that she has not been involved in fights or had any legal charges filed on her.  Plan: We will admit the patient for crisis stabilization and treatment. I talked to pt about starting Imitrex for migrains and stopping Xanax because of risk of disinhibition and memory loss as well as stopping Abilify due to ineffectivity. I explained the risks and benefits of medication in  detail.  We will continue on q. 15 checks the unit protocol. At this time there is no clinical indication for one-to-one observation as patient contract for safety and presents little risk to harm themself and others.  We will increase collateral information. I encourage patient to participate in group milieu therapy. Pt will be seen in treatment team meeting soon for further treatment and appropriate discharge planning. Please see history and physical note for more detailed information ELOS: 3 to 5 days.   Loren Sawaya 09/26/2011, 6:13 PM

## 2011-09-26 NOTE — Progress Notes (Signed)
56yo female who presents voluntarily and in no acute distress for the treatment of SI, Depression, and Anxiety. Appears flat and depressed. Calm and cooperative with admission assessment. States that since she d/c'd from here after last February admission, she has been getting increasingly depressed, anxious and recently suffered from Suicidal Ideations. Her main stressors are her weight gain (50lbs since last admission by her report) and being estranged from her son who is dying from cancer. States they had an argument over his former GF and he has never forgiven her. States her plan was to OD on pills. Currently endorses passive SI, but contracts for safety. Denies HI/AVH. Denies ETOH and illicit drug use. See admission assessment for greater medical detail. Denies pain/discomfort. Unit policies and expectations reviewed and understanding verbalized. POC reviewed and understanding verbalized. Consents obtained. Pt is on Q15 minute observation for safety. Escorted to unit and oriented by [facility], [facility]. Emergency Contact: [name] (sister) [phone]

## 2011-09-27 MED ORDER — GABAPENTIN 300 MG PO CAPS
600.0000 mg | ORAL_CAPSULE | Freq: Every day | ORAL | Status: DC
  Administered 2011-09-27 – 2011-10-02 (×6): 600 mg via ORAL
  Filled 2011-09-27 (×8): qty 2

## 2011-09-27 MED ORDER — CHLORPROMAZINE HCL 50 MG PO TABS
50.0000 mg | ORAL_TABLET | Freq: Every evening | ORAL | Status: DC | PRN
  Administered 2011-09-27: 50 mg via ORAL
  Filled 2011-09-27 (×5): qty 1

## 2011-09-27 MED ORDER — DIVALPROEX SODIUM ER 500 MG PO TB24
1000.0000 mg | ORAL_TABLET | Freq: Every day | ORAL | Status: DC
  Administered 2011-09-27 – 2011-10-02 (×6): 1000 mg via ORAL
  Filled 2011-09-27 (×5): qty 2
  Filled 2011-09-27: qty 14
  Filled 2011-09-27 (×3): qty 2

## 2011-09-27 MED ORDER — SUMATRIPTAN SUCCINATE 100 MG PO TABS: 100.0000 mg | ORAL_TABLET | ORAL | Status: DC | PRN

## 2011-09-27 MED ORDER — DULOXETINE HCL 60 MG PO CPEP
60.0000 mg | ORAL_CAPSULE | Freq: Every day | ORAL | Status: DC
  Administered 2011-09-28: 60 mg via ORAL
  Filled 2011-09-27 (×3): qty 1

## 2011-09-27 MED ORDER — GABAPENTIN 300 MG PO CAPS
600.0000 mg | ORAL_CAPSULE | Freq: Every day | ORAL | Status: DC
  Administered 2011-09-27 – 2011-10-02 (×6): 600 mg via ORAL
  Filled 2011-09-27 (×7): qty 2

## 2011-09-27 NOTE — Progress Notes (Signed)
Patient reports both her depression and hopelessness at a 10/10.  Complains of some off and on suicidal ideation, but contracts for safety.  Complains of headache and nausea, refused imitrex stating that is caused "nausea".  She is participating in groups and interacting with others.

## 2011-09-27 NOTE — Progress Notes (Addendum)
[facility] MD Progress Note  09/27/2011 3:43 PM  S: "I did not sleep last night because of my neurontin evening dosing is not right. I need to be given it at bedtime to help me sleep. That is why I have not been sleeping well. My mood is slightly better today. I had a very bad headache this morning, took Imitrex injection, made me very sick. I threw up, and stayed nauseated most of the morning"  Diagnosis:   Axis I: Schizoaffective Disorder, bipolar type, PTSD Axis II: Deferred Axis III:  Past Medical History  Diagnosis Date  . Hyperlipidemia   . CAD (coronary artery disease)   . Bipolar 1 disorder   . Schizophrenia   . GERD (gastroesophageal reflux disease)   . Hyperthyroidism   . Chronic back pain   . Morbid obesity   . Dyspnea     PFT 03/05/09 FEV1 2.77(98%), FVC 3.25(86%), FEV1% 85, TLC 5.88(99%), DLCO 60% ,  Methacholine challenge 03/16/09 normal ,  CT chest 03/12/09 no pulmonary disease  . Anxiety   . Arthritis   . Depression   . OSA on CPAP     2 liters  . HTN (hypertension)   . History of colonoscopy 10/17/2002    by Dr Rehman-> distal non-specific proctitis, small ext hemorrhoids,   . Fungal infection   . Cellulitis   . Contusion of sacrum   . Migraine headache   . Vitamin d deficiency   . Sleep apnea   . Myocardial infarction     NOV 1997  . Hypothyroidism     States she only has hyperthyroidism  . Complication of anesthesia     States she typically gets sick s/p anesthesia   Axis IV: other psychosocial or environmental problems Axis V: 41-50 serious symptoms  ADL's:  Intact  Sleep: Fair  Appetite:  Good  Suicidal Ideation:  Plan:  Na Means: No Intent: No Homicidal Ideation:  Plan:  No Intent:  No Means:  No  AEB (as evidenced by):Per patient's report  Mental Status Examination/Evaluation: Objective:  Appearance: Casual  Eye Contact::  Good  Speech:  Clear and Coherent  Volume:  Normal  Mood:  Euthymic, and yet slightly depressed.  Affect:   Appropriate  Thought Process:  Coherent  Orientation:  Full  Thought Content:  Rumination  Suicidal Thoughts:  No  Homicidal Thoughts:  No  Memory:  Immediate;   Good Recent;   Good Remote;   Good  Judgement:  Fair  Insight:  Fair  Psychomotor Activity:  Normal  Concentration:  Good  Recall:  Good  Akathisia:  No  Handed:  Right  AIMS (if indicated):     Assets:  Desire for Improvement  Sleep:  Number of Hours: 4.75    Vital Signs:Blood pressure 124/77, pulse 78, temperature 97.6 F (36.4 C), temperature source Oral, resp. rate 20, height 5\' 9"  (1.753 m), weight 181.892 kg (401 lb). Current Medications: Current Facility-Administered Medications  Medication Dose Route Frequency Provider Last Rate Last Dose  . acetaminophen (TYLENOL) tablet 650 mg  650 mg Oral Q6H PRN Mickeal Skinner, MD   650 mg at 09/27/11 0833  . albuterol (PROVENTIL HFA;VENTOLIN HFA) 108 (90 BASE) MCG/ACT inhaler 2 puff  2 puff Inhalation Q4H PRN Sanjuana Kava, NP      . alum & mag hydroxide-simeth (MAALOX/MYLANTA) 200-200-20 MG/5ML suspension 30 mL  30 mL Oral Q4H PRN Mickeal Skinner, MD   30 mL at 09/27/11 8469  . aspirin EC tablet 81  mg  81 mg Oral Daily Sanjuana Kava, NP   81 mg at 09/27/11 9604  . atorvastatin (LIPITOR) tablet 40 mg  40 mg Oral q1800 Sanjuana Kava, NP   40 mg at 09/26/11 1711  . cholecalciferol (VITAMIN D) tablet 2,000 Units  2,000 Units Oral Daily Sanjuana Kava, NP   2,000 Units at 09/27/11 [phone]  . diclofenac sodium (VOLTAREN) 1 % transdermal gel   Topical QID Mike Craze, MD      . furosemide (LASIX) tablet 40 mg  40 mg Oral QODAY Mike Craze, MD   40 mg at 09/26/11 1635   And  . furosemide (LASIX) tablet 20 mg  20 mg Oral QODAY Mike Craze, MD   20 mg at 09/27/11 8119  . gabapentin (NEURONTIN) capsule 300 mg  300 mg Oral QAC breakfast Sanjuana Kava, NP   300 mg at 09/27/11 0640  . gabapentin (NEURONTIN) capsule 600 mg  600 mg Oral BID Sanjuana Kava, NP   600 mg at 09/27/11 1478    . levothyroxine (SYNTHROID, LEVOTHROID) tablet 125 mcg  125 mcg Oral QAC breakfast Sanjuana Kava, NP   125 mcg at 09/27/11 0640  . magnesium hydroxide (MILK OF MAGNESIA) suspension 30 mL  30 mL Oral Daily PRN Mickeal Skinner, MD      . niacin CR capsule 500 mg  500 mg Oral QHS Mike Craze, MD   500 mg at 09/26/11 2148  . pantoprazole (PROTONIX) EC tablet 40 mg  40 mg Oral Q1200 Sanjuana Kava, NP   40 mg at 09/27/11 1151  . polyethylene glycol (MIRALAX / GLYCOLAX) packet 17 g  17 g Oral TID Mike Craze, MD   17 g at 09/27/11 1151  . SUMAtriptan (IMITREX) injection 6 mg  6 mg Subcutaneous Q2H PRN Mike Craze, MD      . verapamil (CALAN-SR) CR tablet 180 mg  180 mg Oral Daily Sanjuana Kava, NP   180 mg at 09/27/11 2956  . DISCONTD: furosemide (LASIX) tablet 20 mg  20 mg Oral Daily Sanjuana Kava, NP      . DISCONTD: furosemide (LASIX) tablet 40 mg  40 mg Oral Daily Sanjuana Kava, NP   40 mg at 09/26/11 1629  . DISCONTD: niacin tablet 500 mg  500 mg Oral QHS Sanjuana Kava, NP        Lab Results:  Results for orders placed during the hospital encounter of 09/25/11 (from the past 48 hour(s))  TSH     Status: Abnormal   Collection Time   09/26/11  6:15 AM      Component Value Range Comment   TSH 4.808 (*) 0.350 - 4.500 (uIU/mL)   VALPROIC ACID LEVEL     Status: Normal   Collection Time   09/26/11  6:15 AM      Component Value Range Comment   Valproic Acid Lvl 71.4  50.0 - 100.0 (ug/mL)     Physical Findings: AIMS:  , ,  ,  ,    CIWA:    COWS:     Treatment Plan Summary: Daily contact with patient to assess and evaluate symptoms and progress in treatment Medication management  Plan: Change Neurontin 600 mg evening dosing to 1700, and 2200 per patient's request. Discontinue Imitrex 6 mg injections, switch to po. Continue current treatment plan.  Armandina Stammer I 09/27/2011, 3:43 PM

## 2011-09-27 NOTE — Progress Notes (Signed)
Patient ID: Allison Sharp, female   DOB: Jan 08, 1956, 56 y.o.   MRN: [id number] Restarted pt's Depakote 1000 mg q HS and Cymbalta 60 mg q AM.  Will substitute Thorazine for her anxiety/sleeping pill instead of the Xanax that she has taken for years.  She is on Depakote and Neurontin so there should not be a problem with withdrawal seizures by stopping the Xanax and giving Thorazine.

## 2011-09-27 NOTE — Progress Notes (Signed)
Pt denies SI/HI/AVH. PT mood depressed. Pt assertive. Pt had complaint of not sleeping last night. States that if she does not sleep well tonight then she will be irritable. Spoke with MD. Nighttime medications added to assist pt with sleep. Support and encouragement offered. Pt receptive.

## 2011-09-28 ENCOUNTER — Ambulatory Visit: Payer: Self-pay | Admitting: Internal Medicine

## 2011-09-28 MED ORDER — CHLORPROMAZINE HCL 10 MG PO TABS
5.0000 mg | ORAL_TABLET | Freq: Three times a day (TID) | ORAL | Status: DC
  Administered 2011-09-28 – 2011-09-29 (×5): 5 mg via ORAL
  Administered 2011-09-30: 22:00:00 via ORAL
  Administered 2011-09-30 – 2011-10-03 (×9): 5 mg via ORAL
  Filled 2011-09-28 (×8): qty 1
  Filled 2011-09-28 (×2): qty 11
  Filled 2011-09-28 (×9): qty 1
  Filled 2011-09-28: qty 11
  Filled 2011-09-28: qty 1

## 2011-09-28 MED ORDER — DULOXETINE HCL 20 MG PO CPEP
40.0000 mg | ORAL_CAPSULE | Freq: Two times a day (BID) | ORAL | Status: DC
  Administered 2011-09-28 – 2011-10-03 (×10): 40 mg via ORAL
  Filled 2011-09-28 (×2): qty 28
  Filled 2011-09-28 (×12): qty 2

## 2011-09-28 MED ORDER — CHLORPROMAZINE HCL 25 MG PO TABS
25.0000 mg | ORAL_TABLET | Freq: Every evening | ORAL | Status: DC | PRN
  Administered 2011-09-28 – 2011-09-30 (×3): 25 mg via ORAL
  Administered 2011-10-01: 22:00:00 via ORAL
  Administered 2011-10-02: 25 mg via ORAL
  Filled 2011-09-28 (×9): qty 1
  Filled 2011-09-28: qty 14
  Filled 2011-09-28 (×2): qty 1
  Filled 2011-09-28: qty 14
  Filled 2011-09-28 (×2): qty 1

## 2011-09-28 NOTE — Progress Notes (Signed)
Pt states states that Thorazine relieved her headache.

## 2011-09-28 NOTE — Plan of Care (Signed)
Problem: Food- and Nutrition-Related Knowledge Deficit (NB-1.1) Goal: Nutrition education Formal process to instruct or train a patient/client in a skill or to impart knowledge to help patients/clients voluntarily manage or modify food choices and eating behavior to maintain or improve health.  Outcome: Completed/Met Date Met:  09/28/11 Knowledge deficit resolved 5/2 with diet education.   Comments:  Discussed and provided healthy nutrition handout obtained from ADA nutrition care manual. Instructed patient how to read food labels. Patient very interested in nutrition information and eager to start making healthier choices. She asked very good questions and asked to be provided sample menus and information on low-sodium, low-cholesterol foods. I have provided patient with all handouts and education she requested. She asked very good questions and is without any further nutrition realted questions at this time.  Patient did admit that she has problems with over eating. We discussed portion sizes/ portion conrtol. I recommend patient be referred to:   Overeaters Anonymous - [facility] Various locations in [place] and [place]  [phone] http://www.oa.org Description: 12-step program of recovery for compulsive overeaters. Newcomers welcome. Various meetings locations and times throughout [address] and [employer]. Online and telephone meetings offered as well. Mondays at 7:00pm: [employer], [address], [place] [address]. Wednesdays at [facility]: [facility], [address], [place] [address]. Thursdays at 7:30pm: [facility], [address], [place] [address]. Thursdays at 7:00pm: [facility] [employer] [facility], [address], [place] [address]. Fridays at 7:00pm: [employer], [address] [place] [address].  *Upon discharge please provide patient with eater anonymous information.   Patient  verbalized understanding of the nutrition information we discussed today. She thanked me for helping her understand this information. Expect fair compliance. She stated her sister will help her shop for healthy foods.   RD available for nutrition needs.  Iven Finn [facility] [phone]

## 2011-09-28 NOTE — Progress Notes (Signed)
Patient ID: Allison Sharp, female   DOB: 1955/06/05, 56 y.o.   MRN: [id number]   Patient appears to be sleeping. Respirations even and non-labored. Will continue to monitor on q 15 minute checks.

## 2011-09-28 NOTE — Discharge Planning (Signed)
Allison Sharp did not attend AM group.  She did not get up until 10:00.  Was happy she slept so well as she had not been sleeping well previously.  Does not want the Dr to reduce the dose of Thorazine.  Promises she will go to AM group tomorrow.  She still describes visions of looking down on her dead body, and states she needs to be here "at least through the weekend" since she was just here in Feb.  Acknoweldged that her weight is a major cause of depression, and found the 12 step group that she attended last night helpful.  Sees Dr Lolly Mustache and Dr Kieth Brightly.  Wants to switch to a female therapist.

## 2011-09-28 NOTE — Progress Notes (Signed)
[facility] MD Progress Note  09/28/2011 2:26 PM  Diagnosis:  Axis I: Post Traumatic Stress Disorder and Schizoaffective Disorder  ADL's:  Intact  Sleep: Fair  Appetite:  Fair  Suicidal Ideation:  Pt sees herself lying dead on the grass when she looks down.  She gets teary-eyed when she explains that she doesn't know how to explain it, but she has been depressed a long time and feels so discouraged.  She does not want to go home until she is better. Homicidal Ideation:  Denies adamantly any homicidal thoughts.  Mental Status Examination/Evaluation: Objective:  Appearance: Casual  Eye Contact::  Good  Speech:  Clear and Coherent  Volume:  Normal  Mood:  Dysphoric  Affect:  Congruent  Thought Process:  Coherent  Orientation:  Full  Thought Content:  WDL  Suicidal Thoughts:  No  Homicidal Thoughts:  No  Memory:  Immediate;   Fair  Judgement:  Fair  Insight:  Fair  Psychomotor Activity:  Normal  Concentration:  Fair  Recall:  Fair  Akathisia:  No  AIMS (if indicated):     Assets:  Communication Skills Desire for Improvement  Sleep:  Number of Hours: 6.75    ROS: Neuro: pt reports headaches so bad that she vomits unrelieved by Tylenol, but no ataxia, weakness  Genl health: pt has now topped 401 pounds.  She emotionally eats to comfort herself.  Will get dietitian to consult on her.  Requested info regarding Overeaters Anonymous.   MS: the cream helps the pain in her knees, no soreness or weakness.  Chondrotin Sulfate and Glucosamine caused her to vomit.  Vital Signs:Blood pressure 125/83, pulse 76, temperature 97.6 F (36.4 C), temperature source Oral, resp. rate 16, height 5\' 9"  (1.753 m), weight 181.892 kg (401 lb). Current Medications: Current Facility-Administered Medications  Medication Dose Route Frequency Provider Last Rate Last Dose  . acetaminophen (TYLENOL) tablet 650 mg  650 mg Oral Q6H PRN Mickeal Skinner, MD   650 mg at 09/28/11 1157  . albuterol (PROVENTIL  HFA;VENTOLIN HFA) 108 (90 BASE) MCG/ACT inhaler 2 puff  2 puff Inhalation Q4H PRN Sanjuana Kava, NP      . alum & mag hydroxide-simeth (MAALOX/MYLANTA) 200-200-20 MG/5ML suspension 30 mL  30 mL Oral Q4H PRN Mickeal Skinner, MD   30 mL at 09/27/11 9811  . aspirin EC tablet 81 mg  81 mg Oral Daily Sanjuana Kava, NP   81 mg at 09/28/11 0946  . atorvastatin (LIPITOR) tablet 40 mg  40 mg Oral q1800 Sanjuana Kava, NP   40 mg at 09/27/11 1713  . chlorproMAZINE (THORAZINE) tablet 25 mg  25 mg Oral QHS,MR X 1 Mike Craze, MD      . chlorproMAZINE (THORAZINE) tablet 5 mg  5 mg Oral TID Mike Craze, MD      . cholecalciferol (VITAMIN D) tablet 2,000 Units  2,000 Units Oral Daily Sanjuana Kava, NP   2,000 Units at 09/28/11 0946  . diclofenac sodium (VOLTAREN) 1 % transdermal gel   Topical QID Mike Craze, MD      . divalproex (DEPAKOTE ER) 24 hr tablet 1,000 mg  1,000 mg Oral QHS Mike Craze, MD   1,000 mg at 09/27/11 2124  . DULoxetine (CYMBALTA) DR capsule 40 mg  40 mg Oral BID Mike Craze, MD      . furosemide (LASIX) tablet 40 mg  40 mg Oral QODAY Mike Craze, MD   40 mg at  09/28/11 0948   And  . furosemide (LASIX) tablet 20 mg  20 mg Oral QODAY Mike Craze, MD   20 mg at 09/27/11 4098  . gabapentin (NEURONTIN) capsule 300 mg  300 mg Oral QAC breakfast Sanjuana Kava, NP   300 mg at 09/28/11 1191  . gabapentin (NEURONTIN) capsule 600 mg  600 mg Oral QAC supper Sanjuana Kava, NP   600 mg at 09/27/11 1623  . gabapentin (NEURONTIN) capsule 600 mg  600 mg Oral QHS Sanjuana Kava, NP   600 mg at 09/27/11 2125  . levothyroxine (SYNTHROID, LEVOTHROID) tablet 125 mcg  125 mcg Oral QAC breakfast Sanjuana Kava, NP   125 mcg at 09/28/11 [phone]  . magnesium hydroxide (MILK OF MAGNESIA) suspension 30 mL  30 mL Oral Daily PRN Mickeal Skinner, MD      . niacin CR capsule 500 mg  500 mg Oral QHS Mike Craze, MD   500 mg at 09/27/11 2125  . pantoprazole (PROTONIX) EC tablet 40 mg  40 mg Oral Q1200 Sanjuana Kava, NP   40 mg at 09/28/11 1155  . polyethylene glycol (MIRALAX / GLYCOLAX) packet 17 g  17 g Oral TID Mike Craze, MD   17 g at 09/28/11 1155  . verapamil (CALAN-SR) CR tablet 180 mg  180 mg Oral Daily Sanjuana Kava, NP   180 mg at 09/28/11 0947  . DISCONTD: chlorproMAZINE (THORAZINE) tablet 50 mg  50 mg Oral QHS,MR X 1 Mike Craze, MD   50 mg at 09/27/11 2123  . DISCONTD: DULoxetine (CYMBALTA) DR capsule 60 mg  60 mg Oral Daily Mike Craze, MD   60 mg at 09/28/11 0946  . DISCONTD: gabapentin (NEURONTIN) capsule 600 mg  600 mg Oral BID Sanjuana Kava, NP   600 mg at 09/27/11 9562  . DISCONTD: SUMAtriptan (IMITREX) injection 6 mg  6 mg Subcutaneous Q2H PRN Mike Craze, MD      . DISCONTD: SUMAtriptan (IMITREX) tablet 100 mg  100 mg Oral Q2H PRN Sanjuana Kava, NP        Lab Results:  No results found for this or any previous visit (from the past 48 hour(s)).  Physical Findings: AIMS:  , ,  ,  ,    CIWA:    COWS:     Treatment Plan Summary: Daily contact with patient to assess and evaluate symptoms and progress in treatment Medication management  Plan: Analgesic balm has helped her knees.  Depression still quite deep without suicide.  Will push Cymbalta.  Thorazine was too strong in that she slept until 10 AM.   Will reduce Thorazine at HS, but will offer very small doses during the day to see if that helps.  Zakiah Gauthreaux 09/28/2011, 2:26 PM

## 2011-09-28 NOTE — Progress Notes (Signed)
[facility] Group Notes: (Counselor/Nursing/MHT/Case Management/Adjunct) 09/28/2011   @  11:00am  Finding Balance in Life  Type of Therapy:  Group Therapy  Participation Level:  Active  Participation Quality:  Attentive, Sharing   Affect:  Blunted  Engagement in Therapy:  Good  Modes of Intervention:  Support and Exploration  Summary of Progress/Problems: Allison Sharp processed the lack of happiness in he life. She clarified this by defining her idea of happiness - being able to smile and laugh and find joy in things she used to. Allison Sharp identified that her depression steals her happiness, and that she does not have the energy to even start to get it back. She also expressed concern that she has not seen any change since being in the hospital, and that she is losing the hope she had for getting better. Allison Sharp stated she does not want to die, and is willing to keep looking for a reason to live, though she feels herself losing strength and energy to hold on.  Billie Lade 09/28/2011   3:22 PM     [facility] Group Notes: (Counselor/Nursing/MHT/Case Management/Adjunct) 09/28/2011   @1 :15pm Art Therapy: The Mask I Wear  Type of Therapy:  Group Therapy  Participation Level:  Limited  Participation Quality:  Attentive  Affect:  Blunted  Cognitive:  Appropriate  Insight:  Limited  Engagement in Group:   Engagement in Therapy:  Limited  Modes of Intervention:  Support and Exploration  Summary of Progress/Problems: Allison Sharp participated in art therapy exercise but did not share her experience with others. She stated that she has a mask she wears so much that others cannot tell when she needs help, but did not share what the mask is or what it covers.   Billie Lade 09/28/2011  3:25 PM

## 2011-09-28 NOTE — Progress Notes (Signed)
Pt denies SI/HI/AVH, however pt states that she keeps having visions that she is dead laying in a field of green grass. Pt rates her hopelessness and depression both as a 10. Pt states that she slept will last night and was not able to get up until 10 am. She states that MD decreased her Thorazine so that she would not sleep as long. Pt states that she has attended all groups except for the AM group which she slept through. Support and encouragement offered to pt. Pt receptive.

## 2011-09-28 NOTE — H&P (Signed)
Medical/psychiatric screening examination/treatment/procedure(s) were performed by non-physician practitioner and as supervising physician I was immediately available for consultation/collaboration.  I have seen and examined this patient and agree the major elements of this evaluation.  

## 2011-09-29 MED ORDER — IBUPROFEN 200 MG PO TABS: 100.0000 mg | ORAL_TABLET | ORAL | Status: DC | PRN
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
## 2011-09-29 NOTE — Progress Notes (Signed)
Pt. Very pleasant and cooperative. Stated she was tired and wanted to go to bed early. Very upset that her expensive c-pap machine was removed from her room stating it must remain here see what you can do. Pt. Contracts for safety and stated she had a good day today. Denies SI or hI. Pt. Did attend group this pm.

## 2011-09-29 NOTE — Progress Notes (Signed)
[facility] Adult [facility]patient Family/Significant Other Suicide Prevention Education  Suicide Prevention Education:  Contact Attempts: [name], sister [phone]) has been identified by the patient as the family member/significant other with whom the patient will be residing, and identified as the person(s) who will aid the patient in the event of a mental health crisis.  With written consent from the patient, two attempts were made to provide suicide prevention education, prior to and/or following the patient's discharge.  We were unsuccessful in providing suicide prevention education.  A suicide education pamphlet was given to the patient to share with family/significant other.  Date and time of first attempt: 09/29/2011  3:08pm (called and phone was answered by other sister, Dennie Bible, who counselor does not have consent to talk to; counselor will call back)   Billie Lade 09/29/2011, 3:11 PM

## 2011-09-29 NOTE — Progress Notes (Signed)
Patient seen during d/c planning group.  She reports being a little better today and rates depression and anxiety at eight.  She denies anxiety at zero.  She denies SI but states she still sees herself as dead.  Patient shared she is excited that her sister will be visiting today.  She advised her focus today will be on controlling her eating and making changes in her living arrangements.
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
## 2011-09-30 MED ORDER — NYSTATIN 100000 UNIT/GM EX POWD
Freq: Three times a day (TID) | CUTANEOUS | Status: DC | PRN
  Administered 2011-09-30: 23:00:00 via TOPICAL
  Filled 2011-09-30: qty 15

## 2011-09-30 NOTE — Progress Notes (Signed)
  Allison Sharp is a 56 y.o. female [id number] 1956-03-18  09/25/2011 Principal Problem:  *Schizoaffective disorder, bipolar type Active Problems:  PTSD (post-traumatic stress disorder)   Mental Status: Alert & oriented mood is less depressed now a 7/10. Denies Si/HI/AVH.   Subjective/Objective:  Hasn't slept through the night in years! Thorazine adjustment yesterday has made her less sluggish. Feels she left too soon last time and is learning a lot from groups. Mentioned IOP but she is not sure she could manage commuting from [place]. Did write about her emotions as suggested by Dr.Walker and she looks forward to their time Monday to continue their work.    Filed Vitals:   09/30/11 0908  BP: 146/90  Pulse: 96  Temp:   Resp:     Lab Results:   BMET    Component Value Date/Time   NA 141 09/25/2011 1449   K 4.2 09/25/2011 1449   CL 101 09/25/2011 1449   CO2 29 09/25/2011 1449   GLUCOSE 94 09/25/2011 1449   BUN 12 09/25/2011 1449   CREATININE 0.72 09/25/2011 1449   CREATININE 0.67 08/26/2010 1635   CALCIUM 10.0 09/25/2011 1449   GFRNONAA >90 09/25/2011 1449   GFRAA >90 09/25/2011 1449    Medications:  Scheduled:     . aspirin EC  81 mg Oral Daily  . atorvastatin  40 mg Oral q1800  . chlorproMAZINE  25 mg Oral QHS,MR X 1  . chlorproMAZINE  5 mg Oral TID  . cholecalciferol  2,000 Units Oral Daily  . diclofenac sodium   Topical QID  . divalproex  1,000 mg Oral QHS  . DULoxetine  40 mg Oral BID  . furosemide  40 mg Oral QODAY   And  . furosemide  20 mg Oral QODAY  . gabapentin  300 mg Oral QAC breakfast  . gabapentin  600 mg Oral QAC supper  . gabapentin  600 mg Oral QHS  . levothyroxine  125 mcg Oral QAC breakfast  . niacin  500 mg Oral QHS  . pantoprazole  40 mg Oral Q1200  . polyethylene glycol  17 g Oral TID  . verapamil  180 mg Oral Daily     PRN Meds acetaminophen, albuterol, alum & mag hydroxide-simeth, ibuprofen, magnesium hydroxide Plan: continue current  plan of care.   Jozlyn Schatz,MICKIE D. 09/30/2011

## 2011-09-30 NOTE — Progress Notes (Signed)
Patient ID: Allison Sharp, female   DOB: [birth year], 56 y.o.   MRN: [id number] Pt. In bed, eyes closed, CPAP intact, no distress noted. Staff will monitor q56min for safety.

## 2011-09-30 NOTE — Progress Notes (Signed)
Patient ID: Allison Sharp, female   DOB: 06-10-1955, 56 y.o.   MRN: [id number] Received order from Dr. Charlann Boxer for Nystatin powder to apply beneath left breast, writer will initiate tonight, pharmacy notified.

## 2011-09-30 NOTE — Progress Notes (Signed)
[facility] Group Notes:  (Counselor/Nursing/MHT/Case Management/Adjunct)  09/30/2011 1315  Type of Therapy:  Group Therapy  Participation Level:  Active  Participation Quality:  Appropriate  Affect:  Appropriate  Cognitive:  Appropriate  Insight:  Good  Engagement in Group:  Good  Engagement in Therapy:  Good  Modes of Intervention:  Activity, Clarification, Problem-solving, Socialization and Support  Summary of Progress/Problems: Pt attended group on self-sabotage and enabling behaviors. Pt were asked to describe self-sabotage and get into groups of two too review the "7 signs of self-sabotage" handout". Pt stated that she self-sabotages by setting unattainable goals. Pt states that she isolates herself and purposefully does not answer her cell phone.   Allison Sharp, Allison Sharp 09/30/2011, 4:48 PM

## 2011-09-30 NOTE — Progress Notes (Signed)
[facility] Group Notes:  (Counselor/Nursing/MHT/Case Management/Adjunct)  09/30/2011 0830  Type of Therapy:  Discharge Planning  Summary of Progress/Problems: Pt states that there have not been any changes to her medications. Pt denies SI/HI. Pt states that she slept all night. Pt stated that she will be going to a senior living arrangement after D/C.   CROSSWELL, DESIREE L 09/30/2011, 2:31 PM

## 2011-09-30 NOTE — Progress Notes (Signed)
Pt has been attending the program, partisipating and interacting with her peers. Rates her depression and her hopelessness at a 7 and denies SI and HI. States she continues to work on her issues of loss and letting go. Given support, reassurance and praise.

## 2011-09-30 NOTE — Progress Notes (Signed)
Patient ID: Allison Sharp, female   DOB: 11/21/1955, 56 y.o.   MRN: [id number] Pt. Reports pain at "10" in back, but plans to go to bed early as this brings her relief "been up all day". Pt. Reports group very helpful, but found it hard to think of 25 good things to say about herself. Pt. Says she got some input from her sister. Pt. Told about how she keeps a friend dog when she goes out of town for weeks at the time. Pt. Reports that she is a good person. Pt. Smiles, realizes she has one more thing to add to her list. Pt. Denies SHI. Staff will monitor q25min for safety.

## 2011-10-01 NOTE — Progress Notes (Signed)
[facility] Group Notes:  (Counselor/Nursing/MHT/Case Management/Adjunct)  10/01/2011 1315  Type of Therapy:  Group Therapy  Participation Level:  Active  Participation Quality:  Appropriate  Affect:  Appropriate  Cognitive:  Appropriate  Insight:  Good  Engagement in Group:  Good  Engagement in Therapy:  Good  Modes of Intervention:  Clarification, Problem-solving, Socialization and Support  Summary of Progress/Problems: Pt attended and participated in group on supports. Pt's were asked what they think support means and how they seek support outside of the hospital. Pt stated that she receives support from her sisters because she is always there for her.   CROSSWELL, DESIREE L 10/01/2011, 3:39 PM

## 2011-10-01 NOTE — Progress Notes (Signed)
  Allison Sharp is a 56 y.o. female [id number] 1956-03-12  09/25/2011 Principal Problem:  *Schizoaffective disorder, bipolar type Active Problems:  PTSD (post-traumatic stress disorder)   Mental Status: Alert 7 oriented mood and affect brighter denies SI/HI/AVH says that hopelessness and depression remain a 7.   Subjective/Objective: Still feels that she is benefiting from the groups etc.    Filed Vitals:   10/01/11 0722  BP: 129/91  Pulse: 113  Temp:   Resp:     Lab Results:   BMET    Component Value Date/Time   NA 141 09/25/2011 1449   K 4.2 09/25/2011 1449   CL 101 09/25/2011 1449   CO2 29 09/25/2011 1449   GLUCOSE 94 09/25/2011 1449   BUN 12 09/25/2011 1449   CREATININE 0.72 09/25/2011 1449   CREATININE 0.67 08/26/2010 1635   CALCIUM 10.0 09/25/2011 1449   GFRNONAA >90 09/25/2011 1449   GFRAA >90 09/25/2011 1449    Medications:  Scheduled:     . aspirin EC  81 mg Oral Daily  . atorvastatin  40 mg Oral q1800  . chlorproMAZINE  25 mg Oral QHS,MR X 1  . chlorproMAZINE  5 mg Oral TID  . cholecalciferol  2,000 Units Oral Daily  . diclofenac sodium   Topical QID  . divalproex  1,000 mg Oral QHS  . DULoxetine  40 mg Oral BID  . furosemide  40 mg Oral QODAY   And  . furosemide  20 mg Oral QODAY  . gabapentin  300 mg Oral QAC breakfast  . gabapentin  600 mg Oral QAC supper  . gabapentin  600 mg Oral QHS  . levothyroxine  125 mcg Oral QAC breakfast  . niacin  500 mg Oral QHS  . pantoprazole  40 mg Oral Q1200  . polyethylene glycol  17 g Oral TID  . verapamil  180 mg Oral Daily     PRN Meds acetaminophen, albuterol, alum & mag hydroxide-simeth, ibuprofen, magnesium hydroxide, nystatin  Plan: continue current plan of care.  Shyla Gayheart,MICKIE D. 10/01/2011

## 2011-10-01 NOTE — Progress Notes (Signed)
Pt states she is feeling more hopeful today. States that she prayed that her son would get off of drugs and he did. In the meantime he decided that he didn't want to talk with his mother any more. Pt has been dealing with this separation and loss of her younger son. States that she is going to start praying again and ask God to allow her son to talk with her. States she has no idea what she has done. She wants very much to talk with him before he dies of cancer.  Pt denies SI and HI. Given support and reassurance along with praise.

## 2011-10-01 NOTE — Progress Notes (Signed)
Patient ID: Allison Sharp, female   DOB: Feb 22, 1956, 56 y.o.   MRN: [id number] The patient is resting in bed with her c-pap machine on. No distress noted. 15 minute checks maintained for safety.

## 2011-10-02 MED ORDER — VERAPAMIL HCL 180 MG (CO) PO TB24: 180.0000 mg | ORAL_TABLET | Freq: Every day | ORAL | Status: DC

## 2011-10-02 MED ORDER — NYSTATIN 100000 UNIT/GM EX POWD: 1.0000 g | Freq: Three times a day (TID) | CUTANEOUS | Status: DC | PRN

## 2011-10-02 MED ORDER — ONE-DAILY MULTI VITAMINS PO TABS: 1.0000 | ORAL_TABLET | ORAL | Status: DC

## 2011-10-02 MED ORDER — ASPIRIN 81 MG PO TBEC: 81.0000 mg | DELAYED_RELEASE_TABLET | Freq: Every day | ORAL | Status: DC

## 2011-10-02 MED ORDER — CHLORPROMAZINE HCL 25 MG PO TABS: 25.0000 mg | ORAL_TABLET | Freq: Every evening | ORAL | Status: DC | PRN

## 2011-10-02 MED ORDER — VITAMIN D 50 MCG (2000 UT) PO TABS: 2000.0000 [IU] | ORAL_TABLET | ORAL | Status: DC

## 2011-10-02 MED ORDER — FUROSEMIDE 20 MG PO TABS: 20.0000 mg | ORAL_TABLET | ORAL | Status: DC

## 2011-10-02 MED ORDER — POLYETHYLENE GLYCOL 3350 17 G PO PACK: 17.0000 g | PACK | Freq: Three times a day (TID) | ORAL | Status: DC

## 2011-10-02 MED ORDER — NIACIN ER (ANTIHYPERLIPIDEMIC) 500 MG PO TBCR: 500.0000 mg | EXTENDED_RELEASE_TABLET | Freq: Every evening | ORAL | Status: DC

## 2011-10-02 MED ORDER — LEVOTHYROXINE SODIUM 125 MCG PO TABS: 125.0000 ug | ORAL_TABLET | ORAL | Status: DC

## 2011-10-02 MED ORDER — DIVALPROEX SODIUM ER 500 MG PO TB24: 1000.0000 mg | ORAL_TABLET | Freq: Every day | ORAL | Status: DC

## 2011-10-02 MED ORDER — DICLOFENAC SODIUM 1 % TD GEL: 1.0000 "application " | Freq: Four times a day (QID) | TRANSDERMAL | Status: DC

## 2011-10-02 MED ORDER — ATORVASTATIN CALCIUM 40 MG PO TABS: 40.0000 mg | ORAL_TABLET | Freq: Every day | ORAL | Status: DC

## 2011-10-02 MED ORDER — DULOXETINE HCL 20 MG PO CPEP: 40.0000 mg | ORAL_CAPSULE | Freq: Two times a day (BID) | ORAL | Status: DC

## 2011-10-02 MED ORDER — CHLORPROMAZINE HCL 10 MG PO TABS: 5.0000 mg | ORAL_TABLET | Freq: Three times a day (TID) | ORAL | Status: DC

## 2011-10-02 NOTE — Progress Notes (Signed)
[facility] Group Notes: (Counselor/Nursing/MHT/Case Management/Adjunct) 10/02/2011   @  11:00am Overcoming Obstacles to Wellness   Type of Therapy:  Group Therapy  Participation Level:  Active  Participation Quality:  Attentive, Sharing Appropraite  Affect:  Appropriate  Cognitive:  Appropriate  Insight:  Good  Engagement in Group: Good  Engagement in Therapy:  Good  Modes of Intervention:  Support and Exploration  Summary of Progress/Problems: Allison Sharp explored ways that she knows when she is well, including being able to walk for exercise, walk her dog, do daily activities such as showering and eating, and keeping the house clean. She also identified what it looks like when she begins to go into crisis, and processed ways that her friends and family can support her when she is in crisis. Allison Sharp was open to the [employer] program and asked questions about getting involved.   Billie Lade 10/02/2011 4:40 PM       [facility] Group Notes: (Counselor/Nursing/MHT/Case Management/Adjunct) 10/02/2011   @1 :15pm Breathing & Meditation for Anxiety/Anger   Type of Therapy:  Group Therapy  Participation Level:    Participation Quality:    Affect:    Cognitive:  Appropriate  Insight:    Engagement in Group:   Engagement in Therapy:    Modes of Intervention:  Support and Exploration  Summary of Progress/Problems: Allison Sharp participated in circle breathing and lovingkindness meditation. She explored  How it helped to relax her and asked for a copy of the CD to be used at home.   Billie Lade 10/02/2011 4:43 PM

## 2011-10-02 NOTE — Progress Notes (Signed)
Patient seen during d/c planning group.  She reports being better today and denies SI/HI.  Patient advised groups have been very beneficial and she is applying information learn to her life. Patient hopeful to discharge soon.
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
## 2011-10-02 NOTE — Tx Team (Signed)
Interdisciplinary Treatment Plan Update (Adult)  Date:  10/02/2011  Time Reviewed:  10:40 AM   Progress in Treatment: Attending groups:   Yes   Participating in groups:  Yes Taking medication as prescribed:  Yes Tolerating medication:  Yes Family/Significant othe contact made: Counselor to make contact with sister Patient understands diagnosis:  Yes Discussing patient identified problems/goals with staff: Yes Medical problems stabilized or resolved: Yes Denies suicidal/homicidal ideation:Yes Issues/concerns per patient self-inventory:  Other:  New problem(s) identified:  Reason for Continuation of Hospitalization: Anxiety Depression Medication stabilization  Interventions implemented related to continuation of hospitalization:  Medication Management; safety checks q 15 mins  Additional comments:  Estimated length of stay: 1-2 days  Discharge Plan:  Home with outpatient follow up  New goal(s):  Review of initial/current patient goals per problem list:   1.  Goal(s):  Eliminate S/other thoughts of self harm  Met:  Yes  Target date: d/c As evidenced by:  Patient no longer endorsing SI or other thoughts of self harm  2.  Goal (s):  Reduce depression/anxiety (rated at ten on admission)  Met:  Yes  Target date: d/c  As evidenced by:  Patient will rate symptoms or four or below  3.  Goal(s):   Stabilize on medications  Met:  Yes  Target date:  d/c  As evidenced by:  Patient reports medications are working  4.  Goal(s):  Assist with outpatient follow up  Met:  Yes  Target date: d/c  As evidenced [id number] follow up scheduled  Attendees: Patient:     Family:     Physician:  Orson Aloe, MD 10/02/2011 10:40 AM   Nursing:   Barrie Folk, RN 10/02/2011 10:40 AM   CaseManager:  Juline Patch, LCSW 10/02/2011 10:40 AM   Counselor:  [name], LCSW 10/02/2011 10:40 AM   Other:  Serena Colonel, NP 10/02/2011 10:40 AM   Other:  Reyes Ivan, LCSWA 10/02/2011   10:40 AM   Other:  Berneice Heinrich, RN 10/02/2011  10:50 AM   Other:      Scribe for Treatment Team:   Wynn Banker, LCSW,  10/02/2011 10:40 AM

## 2011-10-02 NOTE — Progress Notes (Signed)
Patient participating in group, active in the millue today. Patient reports sleep as fair last night. Patient reports good appetite and low energy level at this time. Patient describes ability to pay attention as good. On a scale from 1-10 depression rated as 6/10, feeling hopeless rated 6/10. Patient denies si/hi also denies A/V hallucinations. Patient describes chronic back pain as 9/10, medications given. Patient verbalizes "feeling better and looking forward to discharge." Patient remains safe on the unit, will continue to monitor.

## 2011-10-02 NOTE — Progress Notes (Signed)
Patient ID: Allison Sharp, female   DOB: Dec 26, 1955, 56 y.o.   MRN: [id number] The patient still has a flat affect and depressed mood. She is pleasant and interacts appropriately in the milieu. Compliant with medication. Retired to bed immediately after group. Resting in bed with c-pap on.

## 2011-10-03 MED ORDER — DULOXETINE HCL 60 MG PO CPEP
60.0000 mg | ORAL_CAPSULE | Freq: Two times a day (BID) | ORAL | Status: DC
  Filled 2011-10-03: qty 28

## 2011-10-03 MED ORDER — DULOXETINE HCL 60 MG PO CPEP: 60.0000 mg | ORAL_CAPSULE | Freq: Two times a day (BID) | ORAL | Status: DC
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
## 2011-10-03 NOTE — Discharge Summary (Signed)
I agree with this D/C Summary.  

## 2011-10-03 NOTE — Tx Team (Signed)
Interdisciplinary Treatment Plan Update (Adult)  Date:  10/03/2011  Time Reviewed:  10:39 AM   Progress in Treatment: Attending groups:   Yes   Participating in groups:  Yes Taking medication as prescribed:  Yes Tolerating medication:  Yes Family/Significant othe contact made: Contact made with family Patient understands diagnosis:  Yes Discussing patient identified problems/goals with staff: Yes Medical problems stabilized or resolved: Yes Denies suicidal/homicidal ideation:Yes Issues/concerns per patient self-inventory:  Other:  New problem(s) identified:  Reason for Continuation of Hospitalization:  Interventions implemented related to continuation of hospitalization:  Additional comments:  Estimated length of stay:  Discharge home today  Discharge Plan:  Home with outpatient follow up  New goal(s):  Review of initial/current patient goals per problem list:    1.  Goal(s):  Eliminate SI/thoughts of self harm  Met:  Yes  Target date:  D/c  As evidenced by: Patient no longer endorsing SI/self harm  2.  Goal (s):  Reduce depression/anxiety (rated at ten on admission)  Met:  Yes  Target date: d/c   As evidenced by:  Patient will rate symptoms at four or below (currently rates both at one)  3.  Goal(s):  Stabilize on medications  Met:  Yes  Target date: d/c  As evidenced by:  Patient reports being stable - medications working  4.  Goal(s):  Assist with outpatient follow up  Met:  Yes  Target date: d/c  As evidenced by:  Outpatient appointments scheduled  Attendees: Patient:  Allison Sharp 10/03/2011  11:10 AM  Family:     Physician:  Orson Aloe, MD 10/03/2011 10:39 AM   Nursing:   Neill Loft, RN 10/03/2011 10:39 AM   CaseManager:  Juline Patch, LCSW 10/03/2011 10:39 AM   Counselor:  Nori Riis, LCSW 10/03/2011 10:39 AM   Other:  Ellison Hughs, PharmD 10/03/2011 10:39 AM   Other:  Reyes Ivan, LCSWA 10/03/2011  10:39 AM   Other:     Other:       Scribe for Treatment Team:   Wynn Banker, LCSW,  10/03/2011 10:39 AM   .

## 2011-10-03 NOTE — Progress Notes (Signed)
Patient ID: Allison Sharp, female   DOB: 1956-03-14, 56 y.o.   MRN: [id number] Pt. Denies lethality or other problems and was given discharge instructions, medications to take home with explanations and follow-up instructions for after-care. Pt. states she understands; belongings were returned to her, including those from the locker and Pt. was accompanied, in good condition, to the front lobby to meet her ride home.

## 2011-10-03 NOTE — Progress Notes (Signed)
[facility] Case Management Discharge Plan:  Will you be returning to the same living situation after discharge: Yes,  Patient returning to her home with family At discharge, do you have transportation home?:Yes,  Family to transport patient home Do you have the ability to pay for your medications:Yes,  Patient has insurance and can make co-pay  Interagency Information:     Release of information consent forms completed and in the chart;  Patient's signature needed at discharge.  Patient to Follow up at:    Patient denies SI/HI:   Yes,  Patient is no longer endorsing SI    Safety Planning and Suicide Prevention discussed:  Yes,  Reviewed during aftercare groups  Barrier to discharge identified:No.  Summary and Recommendations:  Patient encouraged to follow up with outpatient recommendations   Wynn Banker 10/03/2011, 10:37 AM

## 2011-10-03 NOTE — Progress Notes (Signed)
Currently resting quietly in bed with eyes closed. Respirations are even and unlabored. CPAP and O2 concentrator noted at pts bedside. No acute distress noted. Safety has been maintained with Q15 minute observation. Will continue current POC.

## 2011-10-05 ENCOUNTER — Encounter (HOSPITAL_COMMUNITY): Payer: Self-pay | Admitting: Psychiatry

## 2011-10-05 ENCOUNTER — Ambulatory Visit (INDEPENDENT_AMBULATORY_CARE_PROVIDER_SITE_OTHER): Payer: No Typology Code available for payment source | Admitting: Psychiatry

## 2011-10-05 VITALS — Wt 399.0 lb

## 2011-10-05 DIAGNOSIS — F319 Bipolar disorder, unspecified: Secondary | ICD-10-CM

## 2011-10-05 NOTE — Progress Notes (Signed)
Chief complaint I was [facility] due to severe depression and having suicidal thoughts.      History of presenting illness Patient is 56 year old Caucasian female who came for her follow up appointment. Patient patient was recently [facility] to [facility] after experiencing significant depression having suicidal thoughts.  She was missing his son who has throat cancer and refuse to talk to him.  Patient has not seen him in past 10 years.  On her last visit patient endorsed that she had talked to him and visited him recently but today patient told that she was lying and she has not seen him in 10 years.  Patient endorse he was very upset 10 years ago and I even don't remember what made him so bad .  Patient feels guilty about not helping him who is suffering from pain and needed help .  These thoughts causes patient over depressed with feelings of hopelessness and helplessness.  The patient denied any suicidal plan but having significant depression and needed a safer place to avoid any self abusive behavior.  Patient also endorse significant psychosocial stress in her life.  Her niece is getting divorced and she is feeling very sad about it.  Upon discharge she was staying with her sister but realized that she need to move out.  Prior to psychiatric admission she was staying with her niece and sharing the place with them.  However now she had decided to live in senior living apartment.  She recently applied for place in [place].  She is hoping to have a good news from them.  During her hospital stay her medicine has been changed and adjusted.  She is taking Cymbalta 60 mg twice a day, her Abilify and Valium has been discontinued and she started on Thorazine during the day and also at bedtime.  She endorse much improvement in sleep and racing thoughts but Thorazine however she is not taking today and she feels sedated.  Currently patient denies any active or passive suicidal  thoughts or homicidal thoughts.  She realized that her son is mad at her but there is nothing she can do about it.  She is praying and going to the church regularly.  She denies any crying spells or any hallucination or paranoid thinking.  Currently she denies any side effects of medication.  She is also upset her weight gain and hoping to reduce weight with change of medication.  She endorse in past few weeks she was eating too much as a coping skill to deal with her depression.  She wants to try a female therapist in this office.  Current psychiatric medication Depakote 1000  mg at bedtime, last level 70.1 Cymbalta 60 mg twice a day Thorazine 25 mg at bedtime and 5 mg 3 times a day but she is taking only at bedtime. Neurontin 300 mg up to 4 times a day given by his primary care physician.  Past psychiatric history Patient has multiple psychiatric admission. She has at least 3 his suicidal attempt in the past. Her last admission was  2 weeks ago.  In the past she had tried Paxil Prozac Wellbutrin Effexor Lexapro amitriptyline however she always had a good response with Cymbalta but it causes constipation. She has been diagnosed with bipolar disorder  Medical history Patient has history of hyperlipidemia, GERD, chronic back pain, obesity, arthritis, hypertension, abstract and sleep apnea, vitamin D deficiency and history of myocardial infarction. Her primary care physician is  Dr.  Ileana Ladd in [facility].  Family history Patient has family member who has been diagnosed with bipolar disorder  Social history Patient currently living with her niece and her husband however they are getting divorced.  In the past she was living with her sister however she was not happy with her and decided to move with her niece.  Earlier she was living on her own but she has difficulty keeping appointment and taking medication on time and her sister convince her to live with her .  Patient  now decided to live in senior living apartments .  She recently applied and waiting for response.    Mental status examination Patient is morbid obese female who is casually dressed and fairly groomed. Her speech is slow but relevant. Her thought process is also slow but logical and linear. Her attention and concentration is fair.   she described her mood anxious and depressed and her affect is constricted .  She denies any active or passive suicidal thoughts or homicidal thoughts. She denies any auditory or visual hallucination.  she denies any paranoia, delusional very good session.  She has no tremors or shakes present at this time.  She's alert and oriented x3. Her insight judgment and impulse control is fair.  Assessment Axis I bipolar disorder  Axis II deferred  Axis III see medical history Axis IV mild to moderate  Axis V 55-60  Plan IReviewed psychosocial stressors, recent discharge summary , medication response and blood results.  She likes her current medication .  Her Depakote level is 71.  Her CBC and chemistry is within normal limits.  She is taking Thorazine 25 mg at bedtime and extra 5 mg at bedtime.  She's not taking any Thorazine given today.  She likes increase Cymbalta however in the past she had constipation with Cymbalta .  We will closely monitor side effects of Cymbalta .  I recommend to call us if she is in a question or concern about the medication.  She is scheduled to see Florencia Reasons in this office for counseling.  I discussed safety plan that in case she is having suicidal thoughts or homicidal thoughts and she need to call 911 or go to local emergency room.  I will see her again in 2 weeks.  Time spent 30 minutes.

## 2011-10-06 NOTE — Progress Notes (Signed)
Patient Discharge Instructions:  Next Level Care Provider Has Access to the EMR, 10/05/2011  [facility] has emr access via [facility]/Epic.  Wandra Scot, 10/06/2011, 2:35 PM

## 2011-10-09 ENCOUNTER — Ambulatory Visit: Payer: Self-pay | Admitting: Pulmonary Disease

## 2011-10-10 ENCOUNTER — Ambulatory Visit (INDEPENDENT_AMBULATORY_CARE_PROVIDER_SITE_OTHER): Payer: Self-pay | Admitting: Psychiatry

## 2011-10-10 DIAGNOSIS — F319 Bipolar disorder, unspecified: Secondary | ICD-10-CM
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
## 2011-10-12 NOTE — Patient Instructions (Signed)
Discussed orally 

## 2011-10-17 ENCOUNTER — Other Ambulatory Visit (HOSPITAL_COMMUNITY): Payer: Self-pay | Admitting: Psychiatry

## 2011-10-17 ENCOUNTER — Telehealth (HOSPITAL_COMMUNITY): Payer: Self-pay | Admitting: *Deleted

## 2011-10-20 ENCOUNTER — Telehealth (HOSPITAL_COMMUNITY): Payer: Self-pay | Admitting: *Deleted

## 2011-10-24 ENCOUNTER — Other Ambulatory Visit (HOSPITAL_COMMUNITY): Payer: Self-pay | Admitting: Psychiatry

## 2011-10-26 ENCOUNTER — Encounter (HOSPITAL_COMMUNITY): Payer: Self-pay | Admitting: Psychiatry

## 2011-10-26 ENCOUNTER — Ambulatory Visit (INDEPENDENT_AMBULATORY_CARE_PROVIDER_SITE_OTHER): Payer: No Typology Code available for payment source | Admitting: Psychiatry

## 2011-10-26 VITALS — Wt 398.0 lb

## 2011-10-26 DIAGNOSIS — F319 Bipolar disorder, unspecified: Secondary | ICD-10-CM

## 2011-10-26 MED ORDER — DIVALPROEX SODIUM ER 500 MG PO TB24: 1000.0000 mg | ORAL_TABLET | Freq: Every day | ORAL | Status: DC

## 2011-10-26 MED ORDER — CHLORPROMAZINE HCL 25 MG PO TABS: 25.0000 mg | ORAL_TABLET | Freq: Every day | ORAL | Status: DC

## 2011-10-26 MED ORDER — DULOXETINE HCL 60 MG PO CPEP: 60.0000 mg | ORAL_CAPSULE | Freq: Two times a day (BID) | ORAL | Status: DC

## 2011-10-26 NOTE — Progress Notes (Signed)
Chief complaint Medication management and followup.  I am feeling very tired and like to stop Thorazine during the daytime.      History of presenting illness Patient is 56 year old Caucasian female who came for her follow up appointment with her sister. Patient complained of increased tiredness and fatigue during the day with Thorazine.  She is taking Thorazine at bedtime and sleeping better however wondering if she can stop taking Thorazine during the day.  She takes Thorazine 5 mg 3 times a day.  Overall her depression agitation and mood swings are better.  She wants to be more involved in her daily life.  She's been going outside few times for walking.  She is able to lose 1 pound from the last visit .  She still waiting for her own place however she has a lot of support from her sister.  Patient and her sister are going to [place] for vacation and then in July they're going to visit family member N4.  Patient is taking her medication regularly.  She is taking Cymbalta 60 mg twice a day.  In the past she had complained of constipation with Cymbalta however she start taking MiraLAX and denies any recent constipation.  She sleeps better.  She denies any crying spells or any recent panic attack.  She denies any tremors or shakes.  She's not drinking or using any illegal substance.  She is seeing Florencia Reasons for counseling and she likes to continue her session with her.    Current psychiatric medication Depakote 1000  mg at bedtime, last level 70.1 Cymbalta 60 mg twice a day Thorazine 25 mg at bedtime. Neurontin 300 mg up to 4 times a day given by his primary care physician.  Past psychiatric history Patient has multiple psychiatric admission. She has at least 3 his suicidal attempt in the past. Her last admission was  2 weeks ago.  In the past she had tried Paxil Prozac Wellbutrin Effexor Lexapro amitriptyline however she always had a good response with Cymbalta but it causes constipation. She  has been diagnosed with bipolar disorder  Medical history Patient has history of hyperlipidemia, GERD, chronic back pain, obesity, arthritis, hypertension, abstract and sleep apnea, vitamin D deficiency and history of myocardial infarction. Her primary care physician is  Dr. Ileana Ladd in [facility].  Family history Patient has family member who has been diagnosed with bipolar disorder  Social history Patient currently living with her niece and her husband however they are getting divorced.  In the past she was living with her sister however she was not happy with her and decided to move with her niece.  Earlier she was living on her own but she has difficulty keeping appointment and taking medication on time and her sister convince her to live with her .  Patient now decided to live in senior living apartments .  She recently applied and waiting for response.    Mental status examination Patient is morbid obese female who is casually dressed and fairly groomed. Her speech is slow but relevant. Her thought process is slow but logical and linear. Her attention and concentration is fair.   she described her mood is tired and exhausted and her affect is also constricted.  She denies any active or passive suicidal thoughts or homicidal thoughts. She denies any auditory or visual hallucination.  she denies any paranoia, delusional very good session.  She has no tremors or shakes present at this time.  She's  alert and oriented x3. Her insight judgment and impulse control is fair.  Assessment Axis I bipolar disorder  Axis II deferred  Axis III see medical history Axis IV mild to moderate  Axis V 55-60  Plan I reviewed psychosocial stressors, last progress note, medication response and side effects.  I do believe patient will get better in her energy if we reduced to take Thorazine only at bedtime.  I will discontinue Thorazine during the day.  I recommend to continue her  Depakote and Cymbalta as prescribed.  I encourage to keep walking and do regular exercise and monitor her calorie intake.  She likes to keep appointment with a therapist for coping and social skills.  I explained the risks and benefits of medication and recommended to call us if she has any question or concern about the medication .  I will see her again in 6 weeks.  Time spent 30 minutes.

## 2011-10-30 ENCOUNTER — Ambulatory Visit (INDEPENDENT_AMBULATORY_CARE_PROVIDER_SITE_OTHER): Payer: No Typology Code available for payment source | Admitting: Psychiatry

## 2011-10-30 DIAGNOSIS — F319 Bipolar disorder, unspecified: Secondary | ICD-10-CM
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
## 2011-10-30 NOTE — Patient Instructions (Signed)
Discussed orally 

## 2011-11-03 ENCOUNTER — Other Ambulatory Visit (HOSPITAL_COMMUNITY): Payer: Self-pay | Admitting: Psychiatry

## 2011-11-03 ENCOUNTER — Telehealth (HOSPITAL_COMMUNITY): Payer: Self-pay | Admitting: *Deleted

## 2011-11-27 ENCOUNTER — Ambulatory Visit (HOSPITAL_COMMUNITY): Payer: Self-pay | Admitting: Psychiatry

## 2011-11-29 ENCOUNTER — Encounter: Payer: Self-pay | Admitting: Internal Medicine

## 2011-12-01 ENCOUNTER — Ambulatory Visit (INDEPENDENT_AMBULATORY_CARE_PROVIDER_SITE_OTHER): Payer: Medicare Other | Admitting: Internal Medicine

## 2011-12-01 ENCOUNTER — Encounter: Payer: Self-pay | Admitting: Internal Medicine

## 2011-12-01 VITALS — BP 111/75 | HR 75 | Ht 69.0 in | Wt 399.4 lb

## 2011-12-01 DIAGNOSIS — R0600 Dyspnea, unspecified: Secondary | ICD-10-CM

## 2011-12-01 DIAGNOSIS — R0609 Other forms of dyspnea: Secondary | ICD-10-CM

## 2011-12-01 DIAGNOSIS — R0989 Other specified symptoms and signs involving the circulatory and respiratory systems: Secondary | ICD-10-CM

## 2011-12-01 NOTE — Progress Notes (Signed)
HPI Patient is a 56 year old  With a history of depression, HTN, morbid obesity, dyslipidemia.  I saw her back in June 2012. She was referred for abnormal EKG.   The patient has been evaluated multiple times in the past for CP She had a cath at [facility] in 2000 that was reported normal.  She has had at least 5 myoviews.  Last was at [facility] in 2012 (normal). She notes chronic chest pressure.  Chronic SOB especially with exertion. SHe wears CPAP at night with O2  Allergies  Allergen Reactions  . Ativan (Lorazepam) Other (See Comments)    Delirium  . Naproxen Nausea And Vomiting    Current Outpatient Prescriptions  Medication Sig Dispense Refill  . albuterol (VENTOLIN HFA) 108 (90 BASE) MCG/ACT inhaler Inhale 2 puffs into the lungs every 6 (six) hours as needed. For shortness of breath      . aspirin 81 MG EC tablet Take 1 tablet (81 mg total) by mouth at bedtime. For prevention of stroke and heart attack  30 tablet  0  . atorvastatin (LIPITOR) 40 MG tablet Take 1 tablet (40 mg total) by mouth at bedtime. To help lower cholesterol.  30 tablet  0  . chlorproMAZINE (THORAZINE) 25 MG tablet Take 1 tablet (25 mg total) by mouth at bedtime.  30 tablet  1  . Cholecalciferol (VITAMIN D) 2000 UNITS tablet Take 1 tablet (2,000 Units total) by mouth every morning. For Vitamin D replacement and mood control.  30 tablet  0  . diclofenac sodium (VOLTAREN) 1 % GEL Apply 1 application topically 4 (four) times daily. For management of pain.  1 Tube  0  . divalproex (DEPAKOTE ER) 500 MG 24 hr tablet Take 2 tablets (1,000 mg total) by mouth at bedtime. For mood control.  60 tablet  1  . donepezil (ARICEPT) 10 MG tablet Take 10 mg by mouth every morning.      . DULoxetine (CYMBALTA) 60 MG capsule Take 1 capsule (60 mg total) by mouth 2 (two) times daily. For management of pain and mood.  60 capsule  1  . furosemide (LASIX) 20 MG tablet Take 1-2 tablets (20-40 mg total) by mouth See admin instructions. Pt  ALTERNATES between 20 mg and 40 mg once daily. For edema  45 tablet  0  . gabapentin (NEURONTIN) 300 MG capsule Take 300-600 mg by mouth See admin instructions. Take 1 tab in the AM, 2 tabs at noon, 2 tabs at bedtime. For chronic pain and anxiety.      Marland Kitchen levothyroxine (SYNTHROID, LEVOTHROID) 125 MCG tablet Take 1 tablet (125 mcg total) by mouth every morning. For thyroid replacement.  30 tablet  0  . Multiple Vitamin (MULTIVITAMIN) tablet Take 1 tablet by mouth every morning. Vitamin supplement.  30 tablet  0  . niacin (NIASPAN) 500 MG CR tablet Take 1 tablet (500 mg total) by mouth every evening. For nutritional supplementation.  30 tablet  0  . nystatin (MYCOSTATIN/NYSTOP) 100000 UNIT/GM POWD Apply 1 g (100,000 Units total) topically 3 (three) times daily as needed (reddness and moisture). For yeast infection  60 g  0  . omeprazole (PRILOSEC) 20 MG capsule Take 20 mg by mouth 2 (two) times daily. For acid reflux.      . polyethylene glycol (MIRALAX / GLYCOLAX) packet Take 17 g by mouth 3 (three) times daily. For constipation  14 each  0  . verapamil (COVERA HS) 180 MG (CO) 24 hr tablet Take 1  tablet (180 mg total) by mouth daily with breakfast. For your heart  30 tablet  0    Past Medical History  Diagnosis Date  . Hyperlipidemia   . CAD (coronary artery disease)   . Bipolar 1 disorder   . Schizophrenia   . GERD (gastroesophageal reflux disease)   . Hyperthyroidism   . Chronic back pain   . Morbid obesity   . Dyspnea     PFT 03/05/09 FEV1 2.77(98%), FVC 3.25(86%), FEV1% 85, TLC 5.88(99%), DLCO 60% ,  Methacholine challenge 03/16/09 normal ,  CT chest 03/12/09 no pulmonary disease  . Anxiety   . Arthritis   . Depression   . OSA on CPAP     2 liters  . HTN (hypertension)   . History of colonoscopy 10/17/2002    by Dr Rehman-> distal non-specific proctitis, small ext hemorrhoids,   . Fungal infection   . Cellulitis   . Contusion of sacrum   . Migraine headache   . Vitamin d  deficiency   . Sleep apnea   . Myocardial infarction     NOV 1997  . Hypothyroidism     States she only has hyperthyroidism  . Complication of anesthesia     States she typically gets sick s/p anesthesia    Past Surgical History  Procedure Date  . Cholecystectomy   . Knee arthroscopy   . Appendectomy   . Tonsillectomy   . Back surgery   . Total vaginal hysterectomy   . Cardiac catheterization     nov 1997  . Abdominal hysterectomy     sept 1996  . Tubal ligation   . Joint replacement     bil knee replacement    Family History  Problem Relation Age of Onset  . Cancer Mother   . Hypertension Mother   . Coronary artery disease Father   . Hypertension Brother   . Coronary artery disease Brother   . Anesthesia problems Neg Hx   . Hypotension Neg Hx   . Malignant hyperthermia Neg Hx   . Pseudochol deficiency Neg Hx     History   Social History  . Marital Status: Single    Spouse Name: N/A    Number of Children: 2  . Years of Education: N/A   Occupational History  . Disabled     back problems   Social History Main Topics  . Smoking status: Never Smoker   . Smokeless tobacco: Not on file  . Alcohol Use: No  . Drug Use: No  . Sexually Active: No   Other Topics Concern  . Not on file   Social History Narrative  . No narrative on file    Review of Systems:  All systems reviewed.  They are negative to the above problem except as previously stated.  Vital Signs: BP 111/75  Pulse 75  Ht 5\' 9"  (1.753 m)  Wt 399 lb 6.4 oz (181.167 kg)  BMI 58.98 kg/m2  Physical Exam Patient is a morbidly 56 year old in NAD HEENT:  Normocephalic, atraumatic. EOMI, PERRLA.  Neck: JVP is normal. No bruits.  Lungs: clear to auscultation. No rales no wheezes.  Heart: Regular rate and rhythm. Normal S1, S2. No S3.   No significant murmurs. PMI not displaced.  Abdomen:  Supple, nontender. Normal bowel sounds. No palpable masses. No hepatomegaly.  Extremities:   Good distal  pulses throughout. Lipedema Musculoskeletal :moving all extremities.  Neuro:   alert and oriented x3.  CN II-XII grossly  intact.  EKG:  (08/04/2011)  SR.  INcomp RBBB.  Qtc 460 msec Assessment and Plan:  1.  Abnormal EKG:  I am not concerned about EKG findings for which she was referred.  Not specific  2.  SOB.  I am not convinced that this is an angina equivalent.  I would not pursue other cardiac testing.  I would not rec repeat PFTs.  SHe has gained 50# since last visit.  I encouraged her to lose wt.  3. Obesity.  The patient is morbidly obese and I think this is contrib to majority of symptoms.  I would recomm wt loss  Would consider dietician or weight watchers.  Defer to primary MD.  F/U is PRN.

## 2011-12-07 ENCOUNTER — Encounter (HOSPITAL_COMMUNITY): Payer: Self-pay | Admitting: Psychiatry

## 2011-12-07 ENCOUNTER — Ambulatory Visit (INDEPENDENT_AMBULATORY_CARE_PROVIDER_SITE_OTHER): Payer: Self-pay | Admitting: Psychiatry

## 2011-12-07 ENCOUNTER — Telehealth (HOSPITAL_COMMUNITY): Payer: Self-pay | Admitting: *Deleted

## 2011-12-07 ENCOUNTER — Other Ambulatory Visit (HOSPITAL_COMMUNITY): Payer: Self-pay | Admitting: Psychiatry

## 2011-12-07 VITALS — Wt 388.0 lb

## 2011-12-07 DIAGNOSIS — F319 Bipolar disorder, unspecified: Secondary | ICD-10-CM

## 2011-12-07 MED ORDER — CHLORPROMAZINE HCL 25 MG PO TABS: ORAL_TABLET | ORAL | Status: DC

## 2011-12-07 NOTE — Progress Notes (Signed)
Chief complaint I'm not happy they I'm living and I'm thinking to go back to live with my sister.        History of presenting illness Patient is 56 year old Caucasian female who came for her follow up appointment with her sister. Patient endorse increased anxiety and nervousness living with her niece and her husband.  Patient told her niece husband is drinking and he does not feel comfortable living there.  He was to go back to live with her sister who she was living before however she had argument and do not get along with her sister and decided to live with her nephew.  Patient came today with her other sister who usually comes with her appointment and take care of the medication and doctors appointment.  Patient cannot live with this sister since she has her own family.  Patient is not able to make decision about her living situation.  Her sister who is very involved in her treatment plan and medication management came today with her.  She recommended that patient should go to assisted living place.  I agreed with this advice since patient has tense relationship with her other sister and she cannot live where family members are drinking.  Patient admitted poor sleep feeling tired and having anxiety spells.  Though she denies any suicidal thinking but endorse some time crying spells.  Recently she has seen  Pain management who had prescribed her Butrans pain patches and recommended to decrease Neurontin .  She will schedule to see them again for followup.  Patient feel that her pain has been not getting better with these patches.  She is taking Neurontin only 2 times a day.  She will start taking Neurontin in next few weeks.  She also felt that 25 mg Thorazine sometimes not enough at bedtime.  She admitted racing thoughts and thinking about her future.  She had stopped taking Thorazine during the day as it was making him more drowsy and groggy.  She denies any agitation or severe anger.  She has lost some  weight from her last visit however she continues to have swelling in her feet and legs.  She missed appointment with her therapist however she reschedule another appointment.   Current psychiatric medication Depakote 1000  mg at bedtime, last level 70.1 Cymbalta 60 mg twice a day Thorazine 25 mg at bedtime. Neurontin 300 mg now taking 3 times a day which is prescribed by primary care physician.  Past psychiatric history Patient has multiple psychiatric admission. She has at least 3 his suicidal attempt in the past. Her last admission was  2 weeks ago.  In the past she had tried Paxil Prozac Wellbutrin Effexor Lexapro amitriptyline however she always had a good response with Cymbalta but it causes constipation. She has been diagnosed with bipolar disorder  Medical history Patient has history of hyperlipidemia, GERD, chronic back pain, obesity, arthritis, hypertension, abstract and sleep apnea, vitamin D deficiency and history of myocardial infarction. Her primary care physician is  Dr. Ileana Ladd in [facility].  Family history Patient has family member who has been diagnosed with bipolar disorder  Social history Patient currently living with her niece and her husband however they are getting divorced.  Her niece husband is drinking.  In the past she was living with her sister however she was not happy with her and decided to move with her niece.  Earlier she was living on her own but she has difficulty keeping appointment  and taking medication on time and her sister convince her to live with her .    Mental status examination Patient is morbid obese female who is casually dressed and fairly groomed.  She appeared tense. Her speech is slow but relevant. Her thought process is slow but logical and linear. Her attention and concentration is fair.   she described her mood is anxious and tired.  Her affect is constricted.  She denies any active or passive suicidal thoughts  or homicidal thoughts. She denies any auditory or visual hallucination.  she denies any paranoia, delusional very good session.  She has no tremors or shakes present at this time.  She's alert and oriented x3. Her insight judgment and impulse control is fair.  Assessment Axis I bipolar disorder  Axis II deferred  Axis III see medical history Axis IV mild to moderate  Axis V 55-60  Plan I reviewed psychosocial stressors, medication update, last progress note, medication response and side effects.  I do believe patient having more anxiety since Neurontin has been reduced and Thorazine has been reduced.  However her weight has been lost since Neurontin decreased.  I discussed in detail with the patient and her sister who is involve in her treatment plan to consider assisted living facility for better management and medication compliance.  Patient wants her sister to be in charge of her finances and doctors appointment.  And patient's sister who came today is agreed with the plan.  I recommend to try Thorazine 25 mg up to 50 mg if needed for insomnia which she had tried in the hospital without any side effects.  I believe her anxiety is situational and may get better when she make a decision about her future living.  I recommend to call us if she is a question or concern about the medication or if she feels worsening of the symptoms.  I reinforced to contact her pain management Dr. since detail that is not helping her.  I will continue her current psychiatric medication at this time patient has one additional refill on every medication.  However if she started to take more Thorazine that she will call us for higher dose.  Time spent 30 minutes.  I will see her again in 6 weeks.  Portion of this note is generated with voice dictation software and may contain typographical error.

## 2011-12-29 ENCOUNTER — Ambulatory Visit (HOSPITAL_COMMUNITY): Payer: Self-pay | Admitting: Psychiatry

## 2012-01-02 ENCOUNTER — Emergency Department (HOSPITAL_COMMUNITY)
Admission: EM | Admit: 2012-01-02 | Discharge: 2012-01-02 | Disposition: A | Discharge status: Home / Self Care | Payer: Medicare Other | Attending: Emergency Medicine | Admitting: Emergency Medicine

## 2012-01-02 ENCOUNTER — Emergency Department (HOSPITAL_COMMUNITY): Payer: Medicare Other

## 2012-01-02 ENCOUNTER — Encounter (HOSPITAL_COMMUNITY): Payer: Self-pay | Admitting: Emergency Medicine

## 2012-01-02 DIAGNOSIS — K219 Gastro-esophageal reflux disease without esophagitis: Secondary | ICD-10-CM | POA: Insufficient documentation

## 2012-01-02 DIAGNOSIS — E785 Hyperlipidemia, unspecified: Secondary | ICD-10-CM | POA: Insufficient documentation

## 2012-01-02 DIAGNOSIS — I10 Essential (primary) hypertension: Secondary | ICD-10-CM | POA: Insufficient documentation

## 2012-01-02 DIAGNOSIS — F209 Schizophrenia, unspecified: Secondary | ICD-10-CM | POA: Insufficient documentation

## 2012-01-02 DIAGNOSIS — I517 Cardiomegaly: Secondary | ICD-10-CM | POA: Insufficient documentation

## 2012-01-02 DIAGNOSIS — K5900 Constipation, unspecified: Secondary | ICD-10-CM

## 2012-01-02 DIAGNOSIS — I252 Old myocardial infarction: Secondary | ICD-10-CM | POA: Insufficient documentation

## 2012-01-02 DIAGNOSIS — I2510 Atherosclerotic heart disease of native coronary artery without angina pectoris: Secondary | ICD-10-CM | POA: Insufficient documentation

## 2012-01-02 DIAGNOSIS — F319 Bipolar disorder, unspecified: Secondary | ICD-10-CM | POA: Insufficient documentation

## 2012-01-02 MED ORDER — FLEET ENEMA 7-19 GM/118ML RE ENEM
1.0000 | ENEMA | Freq: Once | RECTAL | Status: AC
  Administered 2012-01-02: 1 via RECTAL

## 2012-01-02 NOTE — ED Notes (Signed)
Large, light brown stool passed. Formed.

## 2012-01-02 NOTE — ED Notes (Signed)
Cleaned herself with wash cloth.

## 2012-01-02 NOTE — ED Notes (Signed)
Patient transported to X-ray 

## 2012-01-02 NOTE — ED Notes (Signed)
Patient up to bedside commode at this time

## 2012-01-02 NOTE — ED Notes (Signed)
Resting sitting up in bed. No distress at this time. Call bell within reach. Will continue to monitor. Family at bedside.

## 2012-01-02 NOTE — ED Notes (Signed)
Patient back to room from xray. No distress. Call bell within reach. Family at bedside. Equal chest rise and fall, regular, unlabored. Will continue to monitor.

## 2012-01-02 NOTE — ED Notes (Signed)
Patient resting sitting up in bed. No distress. Denies needs. Call bell within reach.

## 2012-01-02 NOTE — ED Notes (Signed)
Patient reports, "I have been impacted for two weeks." States she is also experiencing acid reflux and vomited last night.

## 2012-01-02 NOTE — ED Notes (Signed)
MD at bedside. 

## 2012-01-02 NOTE — ED Provider Notes (Signed)
History     CSN: [id number]  Arrival date & time 01/02/12  1901   First MD Initiated Contact with Patient 01/02/12 1914      Chief Complaint  Patient presents with  . Fecal Impaction    (Consider location/radiation/quality/duration/timing/severity/associated sxs/prior treatment) HPI  Patient reports she has chronic back and knee pain from prior surgeries. She was taking gabapentin 5 times a day however she was seen at a chronic pain clinic about 3 weeks ago and was taken off her gabapentin and placed onto bustrans-5 mcg per hour patch, however it wasn't controlling her pain good enough so it was increased to 10 mcg per hour. She reports her last bowel movement was at least 2 weeks ago. She states she is passing gas. She states she has some diffuse abdominal pain and feels bloated. She states last night she had nausea and had vomiting twice. She states it's burning like her reflux. She states she's had chronic constipation but now it just worse. She states that just prior to coming to the ER she had a bowel movement that filled up the toilet and actually clogged the toilet. She relates however she still has a feeling of pressure and feels like she is still constipated. Patient states she has increased her MiraLAX from 3 times a day to 4 times a day.  PCP Dr. Stacie Acres at [facility] in [place]  Past Medical History  Diagnosis Date  . Hyperlipidemia   . CAD (coronary artery disease)   . Bipolar 1 disorder   . Schizophrenia   . GERD (gastroesophageal reflux disease)   . Hyperthyroidism   . Chronic back pain   . Morbid obesity   . Dyspnea     PFT 03/05/09 FEV1 2.77(98%), FVC 3.25(86%), FEV1% 85, TLC 5.88(99%), DLCO 60% ,  Methacholine challenge 03/16/09 normal ,  CT chest 03/12/09 no pulmonary disease  . Anxiety   . Arthritis   . Depression   . OSA on CPAP     2 liters  . HTN (hypertension)   . History of colonoscopy 10/17/2002    by Dr Rehman-> distal non-specific proctitis,  small ext hemorrhoids,   . Fungal infection   . Cellulitis   . Contusion of sacrum   . Migraine headache   . Vitamin d deficiency   . Sleep apnea   . Myocardial infarction     NOV 1997  . Hypothyroidism     States she only has hyperthyroidism  . Complication of anesthesia     States she typically gets sick s/p anesthesia    Past Surgical History  Procedure Date  . Cholecystectomy   . Knee arthroscopy   . Appendectomy   . Tonsillectomy   . Back surgery   . Total vaginal hysterectomy   . Cardiac catheterization     nov 1997  . Abdominal hysterectomy     sept 1996  . Tubal ligation   . Joint replacement     bil knee replacement    Family History  Problem Relation Age of Onset  . Cancer Mother   . Hypertension Mother   . Coronary artery disease Father   . Hypertension Brother   . Coronary artery disease Brother   . Anesthesia problems Neg Hx   . Hypotension Neg Hx   . Malignant hyperthermia Neg Hx   . Pseudochol deficiency Neg Hx     History  Substance Use Topics  . Smoking status: Never Smoker   . Smokeless tobacco: Not  on file  . Alcohol Use: No  lives at home   OB History    Grav Para Term Preterm Abortions TAB SAB Ect Mult Living                  Review of Systems  All other systems reviewed and are negative.    Allergies  Ativan and Naproxen  Home Medications   Current Outpatient Rx  Name Route Sig Dispense Refill  . ALBUTEROL SULFATE HFA 108 (90 BASE) MCG/ACT IN AERS Inhalation Inhale 2 puffs into the lungs every 6 (six) hours as needed. For shortness of breath    . ASPIRIN 81 MG PO TBEC Oral Take 81 mg by mouth every evening. For prevention of stroke and heart attack    . ATORVASTATIN CALCIUM 40 MG PO TABS Oral Take 1 tablet (40 mg total) by mouth at bedtime. To help lower cholesterol. 30 tablet 0  . BUPRENORPHINE 10 MCG/HR TD PTWK Transdermal Place 10 mcg onto the skin once a week.    . CHLORPROMAZINE HCL 25 MG PO TABS  Take 1 to 2 tab at  bed time 60 tablet 1  . VITAMIN D 2000 UNITS PO TABS Oral Take 1 tablet (2,000 Units total) by mouth every morning. For Vitamin D replacement and mood control. 30 tablet 0  . DICLOFENAC SODIUM 1 % TD GEL Topical Apply 1 application topically 4 (four) times daily as needed. For management of pain.    Marland Kitchen DIVALPROEX SODIUM ER 500 MG PO TB24 Oral Take 2 tablets (1,000 mg total) by mouth at bedtime. For mood control. 60 tablet 1  . DONEPEZIL HCL 10 MG PO TABS Oral Take 10 mg by mouth every morning.    . DULOXETINE HCL 60 MG PO CPEP Oral Take 1 capsule (60 mg total) by mouth 2 (two) times daily. For management of pain and mood. 60 capsule 1  . FUROSEMIDE 20 MG PO TABS Oral Take 1-2 tablets (20-40 mg total) by mouth See admin instructions. Pt ALTERNATES between 20 mg and 40 mg once daily. For edema 45 tablet 0  . LEVOTHYROXINE SODIUM 125 MCG PO TABS Oral Take 1 tablet (125 mcg total) by mouth every morning. For thyroid replacement. 30 tablet 0  . ONE-DAILY MULTI VITAMINS PO TABS Oral Take 1 tablet by mouth every morning. Vitamin supplement. 30 tablet 0  . NIACIN ER (ANTIHYPERLIPIDEMIC) 500 MG PO TBCR Oral Take 1 tablet (500 mg total) by mouth every evening. For nutritional supplementation. 30 tablet 0  . NYSTATIN 100000 UNIT/GM EX POWD Topical Apply 1 g (100,000 Units total) topically 3 (three) times daily as needed (reddness and moisture). For yeast infection 60 g 0  . OMEPRAZOLE 20 MG PO CPDR Oral Take 20 mg by mouth 2 (two) times daily. For acid reflux.    Marland Kitchen POLYETHYLENE GLYCOL 3350 PO PACK Oral Take 17 g by mouth 3 (three) times daily. For constipation 14 each 0  . UNABLE TO FIND Oral Take 4-8 oz by mouth daily. Med Name: for JOINT health NAME UNKNOWN    . UNABLE TO FIND  as directed. Med Name: CPAP and OXYGEN used daily at bedtime    . VERAPAMIL HCL ER (CO) 180 MG PO TB24 Oral Take 1 tablet (180 mg total) by mouth daily with breakfast. For your heart 30 tablet 0    BP 122/62  Pulse 73  Temp 97.8 F  (36.6 C) (Oral)  Resp 18  Ht 5\' 9"  (1.753 m)  Wt  388 lb (175.996 kg)  BMI 57.30 kg/m2  SpO2 99%  Vital signs normal    Physical Exam  Nursing note and vitals reviewed. Constitutional: She is oriented to person, place, and time. She appears well-developed and well-nourished.  Non-toxic appearance. She does not appear ill. No distress.  HENT:  Head: Normocephalic and atraumatic.  Right Ear: External ear normal.  Left Ear: External ear normal.  Nose: Nose normal. No mucosal edema or rhinorrhea.  Mouth/Throat: Oropharynx is clear and moist and mucous membranes are normal. No dental abscesses or uvula swelling.  Eyes: Conjunctivae and EOM are normal. Pupils are equal, round, and reactive to light.  Neck: Normal range of motion and full passive range of motion without pain. Neck supple.  Cardiovascular: Normal rate, regular rhythm and normal heart sounds.  Exam reveals no gallop and no friction rub.   No murmur heard. Pulmonary/Chest: Effort normal and breath sounds normal. No respiratory distress. She has no wheezes. She has no rhonchi. She has no rales. She exhibits no tenderness and no crepitus.  Abdominal: Soft. Normal appearance and bowel sounds are normal. She exhibits distension. There is no tenderness. There is no rebound and no guarding.  Musculoskeletal: Normal range of motion. She exhibits no edema and no tenderness.       Moves all extremities well.   Neurological: She is alert and oriented to person, place, and time. She has normal strength. No cranial nerve deficit.  Skin: Skin is warm, dry and intact. No rash noted. No erythema. No pallor.  Psychiatric: She has a normal mood and affect. Her speech is normal and behavior is normal. Her mood appears not anxious.    ED Course  Procedures (including critical care time)   Medications  sodium phosphate (FLEET) 7-19 GM/118ML enema 1 enema (1 enema Rectal Given 01/02/12 2143)    Have discussed her chronic constipation has been  made worse by her new pain medication.   Dg Abd Acute W/chest  01/02/2012  *RADIOLOGY REPORT*  Clinical Data: Abdominal pain and constipation.  ACUTE ABDOMEN SERIES (ABDOMEN 2 VIEW & CHEST 1 VIEW)  Comparison: 08/09/2010  Findings: Stable mild cardiomegaly noted.  Lungs appear clear.  No free intraperitoneal gas is evident beneath the hemidiaphragms.  The upright view only includes the upper abdomen, not extending below the iliac crests.  Prominence of stool throughout the colon suggests constipation.  Lumbosacral posterolateral rod pedicle screw fixation noted.  IMPRESSION:  1. Prominence of stool throughout the colon suggests constipation. 2.  Stable mild cardiomegaly.  Original Report Authenticated By: Dellia Cloud, M.D.     1. Constipation    Plan discharge  Devoria Albe, MD, FACEP    MDM          Ward Givens, MD 01/02/12 2204

## 2012-01-02 NOTE — ED Notes (Signed)
Patient states she has not been able to pass a BM in two weeks. States just prior to arrival she past a very large BM that could not be flushed down the toilet. Remains having diffuse abdominal cramping, mainly on bilateral sides that radiates to lower middle abdomen. States abdomen is swollen. Has vomiting last night but none today. Last fluid intake was throughout the day. Last food intake was a honey bun for lunch. Tolerated well.

## 2012-01-03 ENCOUNTER — Telehealth: Payer: Self-pay | Admitting: *Deleted

## 2012-01-03 NOTE — Telephone Encounter (Signed)
Called and informed pt. APPT on 01/08/2012 @ 9:30 AM with Tana Coast, PA for constipation.

## 2012-01-03 NOTE — Telephone Encounter (Signed)
PLEASE CALL PT.  SHE NEEDS TO CONTINUE MIRALAX Q1H FOR 4 HOURS FOR THE NEXT 3 DAYS. TAKE DULCOLAX 10 MG WITH THE FIRST AND LAST DOSE. SHE WAS A NO SHOW FOR HER LAST 2 APPTS. SHE NEEDS THE FIRST AVAILABLE E30 SLOT FOR CONSTIPATION.

## 2012-01-03 NOTE — Telephone Encounter (Signed)
Called pt. She said she took the Miralax every hr x 4 last night. No results. Then got up twice and did it 2 more times. No results. She has done it twice this morning. Please advise!

## 2012-01-03 NOTE — Telephone Encounter (Signed)
Ms Allison Sharp called today. She was in the ED last night for constipation and they told her to follow up with [name] today. She is taking a lot of laxatives and is getting no relief.  Please follow up with her. Thanks.

## 2012-01-05 ENCOUNTER — Telehealth (HOSPITAL_COMMUNITY): Payer: Self-pay | Admitting: *Deleted

## 2012-01-05 ENCOUNTER — Encounter: Payer: Self-pay | Admitting: Internal Medicine

## 2012-01-05 ENCOUNTER — Emergency Department (HOSPITAL_COMMUNITY)
Admission: EM | Admit: 2012-01-05 | Discharge: 2012-01-05 | Disposition: A | Discharge status: Home / Self Care | Payer: Medicare Other | Attending: Emergency Medicine | Admitting: Emergency Medicine

## 2012-01-05 DIAGNOSIS — I10 Essential (primary) hypertension: Secondary | ICD-10-CM | POA: Insufficient documentation

## 2012-01-05 DIAGNOSIS — Z886 Allergy status to analgesic agent status: Secondary | ICD-10-CM | POA: Insufficient documentation

## 2012-01-05 DIAGNOSIS — Z79899 Other long term (current) drug therapy: Secondary | ICD-10-CM | POA: Insufficient documentation

## 2012-01-05 DIAGNOSIS — E0500 Thyrotoxicosis with diffuse goiter without thyrotoxic crisis or storm: Secondary | ICD-10-CM | POA: Insufficient documentation

## 2012-01-05 DIAGNOSIS — K219 Gastro-esophageal reflux disease without esophagitis: Secondary | ICD-10-CM | POA: Insufficient documentation

## 2012-01-05 DIAGNOSIS — I2510 Atherosclerotic heart disease of native coronary artery without angina pectoris: Secondary | ICD-10-CM | POA: Insufficient documentation

## 2012-01-05 DIAGNOSIS — Z888 Allergy status to other drugs, medicaments and biological substances status: Secondary | ICD-10-CM | POA: Insufficient documentation

## 2012-01-05 DIAGNOSIS — E785 Hyperlipidemia, unspecified: Secondary | ICD-10-CM | POA: Insufficient documentation

## 2012-01-05 DIAGNOSIS — F209 Schizophrenia, unspecified: Secondary | ICD-10-CM

## 2012-01-05 LAB — CBC WITH DIFFERENTIAL/PLATELET
Basophils Absolute: 0 10*3/uL (ref 0.0–0.1)
Basophils Relative: 0 % (ref 0–1)
Eosinophils Absolute: 0.3 10*3/uL (ref 0.0–0.7)
MCH: 30.5 pg (ref 26.0–34.0)
MCHC: 33.3 g/dL (ref 30.0–36.0)
Neutro Abs: 3.7 10*3/uL (ref 1.7–7.7)
Neutrophils Relative %: 49 % (ref 43–77)
RDW: 13.2 % (ref 11.5–15.5)

## 2012-01-05 LAB — URINALYSIS, ROUTINE W REFLEX MICROSCOPIC
Glucose, UA: NEGATIVE mg/dL
Hgb urine dipstick: NEGATIVE
Protein, ur: 30 mg/dL — AB
Specific Gravity, Urine: 1.037 — ABNORMAL HIGH (ref 1.005–1.030)
pH: 5.5 (ref 5.0–8.0)

## 2012-01-05 LAB — COMPREHENSIVE METABOLIC PANEL
ALT: 18 U/L (ref 0–35)
Alkaline Phosphatase: 98 U/L (ref 39–117)
CO2: 31 mEq/L (ref 19–32)
Chloride: 100 mEq/L (ref 96–112)
GFR calc Af Amer: 82 mL/min — ABNORMAL LOW (ref >90)
GFR calc non Af Amer: 71 mL/min — ABNORMAL LOW (ref >90)
Glucose, Bld: 120 mg/dL — ABNORMAL HIGH (ref 70–99)
Potassium: 4.2 mEq/L (ref 3.5–5.1)
Sodium: 138 mEq/L (ref 135–145)
Total Bilirubin: 0.3 mg/dL (ref 0.3–1.2)

## 2012-01-05 LAB — RAPID URINE DRUG SCREEN, HOSP PERFORMED
Benzodiazepines: POSITIVE — AB
Cocaine: NOT DETECTED

## 2012-01-05 LAB — URINE MICROSCOPIC-ADD ON

## 2012-01-05 MED ORDER — CHLORPROMAZINE HCL 10 MG PO TABS: 10.0000 mg | ORAL_TABLET | Freq: Three times a day (TID) | ORAL | Status: DC

## 2012-01-05 MED ORDER — ONDANSETRON 4 MG PO TBDP
4.0000 mg | ORAL_TABLET | Freq: Once | ORAL | Status: AC
  Administered 2012-01-05: 4 mg via ORAL
  Filled 2012-01-05: qty 1

## 2012-01-05 MED ORDER — MORPHINE SULFATE 4 MG/ML IJ SOLN
4.0000 mg | Freq: Once | INTRAMUSCULAR | Status: AC
  Administered 2012-01-05: 4 mg via INTRAMUSCULAR
  Filled 2012-01-05: qty 1

## 2012-01-05 NOTE — ED Notes (Signed)
Attempted to get urine, not enough collected. Will try again. Pt given H20.

## 2012-01-05 NOTE — ED Provider Notes (Signed)
History     CSN: [id number]  Arrival date & time 01/05/12  1413   First MD Initiated Contact with Patient 01/05/12 1505      Chief Complaint  Patient presents with  . Medical Clearance  . Abdominal Pain    (Consider location/radiation/quality/duration/timing/severity/associated sxs/prior treatment) The history is provided by the patient and medical records.   the patient is a 56 year old, female, with a history of schizophrenia.  She presents to the emergency department complaining of hearing voices.  The voices or speaking in the background.  They are not telling her to do anything in particular.  She is frustrated because she cannot understand what the voices are saying.  This has caused her not to be able to sleep.  She also complains of generalized pain.  She denies nausea, vomiting, fevers, chills, cough, shortness breath.  She is constipated.  She denies urinary tract symptoms.  She states she takes Cymbalta 60 mg twice a day and Thorazine 25 mg twice a day.  For her schizophrenia.  She denies suicidal or homicidal ideations.  Past Medical History  Diagnosis Date  . Hyperlipidemia   . CAD (coronary artery disease)   . Bipolar 1 disorder   . Schizophrenia   . GERD (gastroesophageal reflux disease)   . Hyperthyroidism   . Chronic back pain   . Morbid obesity   . Dyspnea     PFT 03/05/09 FEV1 2.77(98%), FVC 3.25(86%), FEV1% 85, TLC 5.88(99%), DLCO 60% ,  Methacholine challenge 03/16/09 normal ,  CT chest 03/12/09 no pulmonary disease  . Anxiety   . Arthritis   . Depression   . OSA on CPAP     2 liters  . HTN (hypertension)   . History of colonoscopy 10/17/2002    by Dr Rehman-> distal non-specific proctitis, small ext hemorrhoids,   . Fungal infection   . Cellulitis   . Contusion of sacrum   . Migraine headache   . Vitamin d deficiency   . Sleep apnea   . Myocardial infarction     NOV 1997  . Hypothyroidism     States she only has hyperthyroidism  . Complication of  anesthesia     States she typically gets sick s/p anesthesia    Past Surgical History  Procedure Date  . Cholecystectomy   . Knee arthroscopy   . Appendectomy   . Tonsillectomy   . Back surgery   . Total vaginal hysterectomy   . Cardiac catheterization     nov 1997  . Abdominal hysterectomy     sept 1996  . Tubal ligation   . Joint replacement     bil knee replacement  . Colonoscopy 10/17/2002     Distal proctitis, small external hemorrhoids, otherwise/  normal colonoscopy. Suspect rectal bleeding secondary to hemorrhoids  . Esophagogastroduodenoscopy 03/18/09    fundic gland polyps/mild gastritis    Family History  Problem Relation Age of Onset  . Cancer Mother   . Hypertension Mother   . Coronary artery disease Father   . Hypertension Brother   . Coronary artery disease Brother   . Anesthesia problems Neg Hx   . Hypotension Neg Hx   . Malignant hyperthermia Neg Hx   . Pseudochol deficiency Neg Hx     History  Substance Use Topics  . Smoking status: Never Smoker   . Smokeless tobacco: Not on file  . Alcohol Use: No    OB History    Grav Para Term Preterm Abortions TAB SAB  Ect Mult Living                  Review of Systems  Constitutional: Negative for fever and chills.  HENT: Negative for congestion.   Eyes: Negative for visual disturbance.  Respiratory: Negative for cough and shortness of breath.   Cardiovascular: Negative for chest pain.  Gastrointestinal: Positive for constipation. Negative for nausea, vomiting and abdominal pain.  Musculoskeletal: Positive for myalgias.  Skin: Negative for rash.  Neurological: Negative for headaches.  Psychiatric/Behavioral: Positive for hallucinations. Negative for suicidal ideas.       Auditory hallucinations  All other systems reviewed and are negative.    Allergies  Ativan and Naproxen  Home Medications     BP 135/87  Pulse 93  Temp 98.5 F (36.9 C) (Oral)  Resp 20  SpO2 94%  Physical Exam    Nursing note and vitals reviewed. Constitutional: She is oriented to person, place, and time. No distress.       Morbidly obese  HENT:  Head: Normocephalic and atraumatic.  Eyes: Conjunctivae are normal.  Neck: Normal range of motion. Neck supple.  Cardiovascular: Normal rate.   No murmur heard. Pulmonary/Chest: Effort normal and breath sounds normal.  Musculoskeletal: Normal range of motion.  Neurological: She is alert and oriented to person, place, and time.  Skin: Skin is warm and dry.  Psychiatric:       Flat affect    ED Course  Procedures (including critical care time) 56 year old, female, with known schizophrenia, presents to emergency department.  Hearing voices.  She is not homicidal or suicidal.  She does not have any evidence of drug or alcohol intoxication.  We will perform labs screening evaluation and console.  Psychiatry concerning the dosages of her medications.  I do not believe she needs admission to the hospital.  At this time.  Labs Reviewed  CBC WITH DIFFERENTIAL - Abnormal; Notable for the following:    Platelets 143 (*)     All other components within normal limits  URINALYSIS, ROUTINE W REFLEX MICROSCOPIC  COMPREHENSIVE METABOLIC PANEL  URINE RAPID DRUG SCREEN (HOSP PERFORMED)   No results found.   No diagnosis found.  7:18 PM telepsych consult obtained.  Psychiatrist, is not find any indication for admission.  He said to increase the Thorazine to 25 mg 3 times a day.  MDM  Schizophrenia hearing voices No indication for admission.        Cheri Guppy, MD 01/05/12 1919

## 2012-01-05 NOTE — ED Notes (Signed)
Pt presenting to ed with c/o hearing voices pt states voices are not telling her to do anything in particular. Pt denies SI/HI. Pt states she has been following up with her psychiatrist and she just needs to know what's causing this

## 2012-01-07 ENCOUNTER — Emergency Department (HOSPITAL_COMMUNITY)
Admission: EM | Admit: 2012-01-07 | Discharge: 2012-01-09 | Disposition: A | Discharge status: Other Acute Inpatient Hospital | Payer: Medicare Other | Attending: Emergency Medicine | Admitting: Emergency Medicine

## 2012-01-07 ENCOUNTER — Encounter (HOSPITAL_COMMUNITY): Payer: Self-pay | Admitting: Emergency Medicine

## 2012-01-07 DIAGNOSIS — I10 Essential (primary) hypertension: Secondary | ICD-10-CM | POA: Insufficient documentation

## 2012-01-07 DIAGNOSIS — I2510 Atherosclerotic heart disease of native coronary artery without angina pectoris: Secondary | ICD-10-CM | POA: Insufficient documentation

## 2012-01-07 DIAGNOSIS — E0590 Thyrotoxicosis, unspecified without thyrotoxic crisis or storm: Secondary | ICD-10-CM | POA: Insufficient documentation

## 2012-01-07 DIAGNOSIS — G4733 Obstructive sleep apnea (adult) (pediatric): Secondary | ICD-10-CM | POA: Insufficient documentation

## 2012-01-07 DIAGNOSIS — R440 Auditory hallucinations: Secondary | ICD-10-CM

## 2012-01-07 DIAGNOSIS — R109 Unspecified abdominal pain: Secondary | ICD-10-CM | POA: Insufficient documentation

## 2012-01-07 DIAGNOSIS — I252 Old myocardial infarction: Secondary | ICD-10-CM | POA: Insufficient documentation

## 2012-01-07 DIAGNOSIS — H5316 Psychophysical visual disturbances: Secondary | ICD-10-CM | POA: Insufficient documentation

## 2012-01-07 DIAGNOSIS — F209 Schizophrenia, unspecified: Secondary | ICD-10-CM | POA: Insufficient documentation

## 2012-01-07 DIAGNOSIS — Z96659 Presence of unspecified artificial knee joint: Secondary | ICD-10-CM | POA: Insufficient documentation

## 2012-01-07 DIAGNOSIS — F319 Bipolar disorder, unspecified: Secondary | ICD-10-CM | POA: Insufficient documentation

## 2012-01-07 DIAGNOSIS — K5900 Constipation, unspecified: Secondary | ICD-10-CM | POA: Insufficient documentation

## 2012-01-07 DIAGNOSIS — R441 Visual hallucinations: Secondary | ICD-10-CM

## 2012-01-07 LAB — RAPID URINE DRUG SCREEN, HOSP PERFORMED
Amphetamines: NOT DETECTED
Barbiturates: NOT DETECTED
Benzodiazepines: POSITIVE — AB
Cocaine: NOT DETECTED
Opiates: NOT DETECTED
Tetrahydrocannabinol: NOT DETECTED

## 2012-01-07 LAB — ETHANOL: Alcohol, Ethyl (B): <11 mg/dL (ref 0–11)

## 2012-01-07 LAB — COMPREHENSIVE METABOLIC PANEL
ALT: 39 U/L — ABNORMAL HIGH (ref 0–35)
Albumin: 3.6 g/dL (ref 3.5–5.2)
Alkaline Phosphatase: 120 U/L — ABNORMAL HIGH (ref 39–117)
BUN: 8 mg/dL (ref 6–23)
Chloride: 99 mEq/L (ref 96–112)
Glucose, Bld: 108 mg/dL — ABNORMAL HIGH (ref 70–99)
Potassium: 3.8 mEq/L (ref 3.5–5.1)
Sodium: 138 mEq/L (ref 135–145)
Total Bilirubin: 0.4 mg/dL (ref 0.3–1.2)

## 2012-01-07 LAB — CBC WITH DIFFERENTIAL/PLATELET
Basophils Relative: 1 % (ref 0–1)
Hemoglobin: 13.9 g/dL (ref 12.0–15.0)
Lymphs Abs: 2.5 10*3/uL (ref 0.7–4.0)
Monocytes Relative: 11 % (ref 3–12)
Neutro Abs: 4.7 10*3/uL (ref 1.7–7.7)
Neutrophils Relative %: 56 % (ref 43–77)
RBC: 4.6 MIL/uL (ref 3.87–5.11)

## 2012-01-07 LAB — URINALYSIS, ROUTINE W REFLEX MICROSCOPIC
Bilirubin Urine: NEGATIVE
Glucose, UA: NEGATIVE mg/dL
Hgb urine dipstick: NEGATIVE
Specific Gravity, Urine: 1.016 (ref 1.005–1.030)
Urobilinogen, UA: 2 mg/dL — ABNORMAL HIGH (ref 0.0–1.0)

## 2012-01-07 MED ORDER — CHLORPROMAZINE HCL 25 MG PO TABS
50.0000 mg | ORAL_TABLET | Freq: Three times a day (TID) | ORAL | Status: DC
  Administered 2012-01-07 – 2012-01-08 (×2): 50 mg via ORAL
  Filled 2012-01-07 (×2): qty 1
  Filled 2012-01-07 (×2): qty 2

## 2012-01-07 MED ORDER — FUROSEMIDE 40 MG PO TABS
40.0000 mg | ORAL_TABLET | ORAL | Status: DC
  Administered 2012-01-08: 40 mg via ORAL
  Filled 2012-01-07: qty 1

## 2012-01-07 MED ORDER — FUROSEMIDE 20 MG PO TABS
20.0000 mg | ORAL_TABLET | ORAL | Status: DC
Start: 2012-01-09 — End: 2012-01-09
  Administered 2012-01-09: 20 mg via ORAL
  Filled 2012-01-07: qty 1

## 2012-01-07 MED ORDER — VERAPAMIL HCL 180 MG (CO) PO TB24: 180.0000 mg | ORAL_TABLET | Freq: Every day | ORAL | Status: DC

## 2012-01-07 MED ORDER — DONEPEZIL HCL 10 MG PO TABS
10.0000 mg | ORAL_TABLET | Freq: Every day | ORAL | Status: DC
  Administered 2012-01-08 – 2012-01-09 (×2): 10 mg via ORAL
  Filled 2012-01-07 (×2): qty 1

## 2012-01-07 MED ORDER — NICOTINE 21 MG/24HR TD PT24: 21.0000 mg | MEDICATED_PATCH | Freq: Every day | TRANSDERMAL | Status: DC

## 2012-01-07 MED ORDER — LEVOTHYROXINE SODIUM 125 MCG PO TABS
125.0000 ug | ORAL_TABLET | Freq: Every day | ORAL | Status: DC
  Administered 2012-01-08 – 2012-01-09 (×2): 125 ug via ORAL
  Filled 2012-01-07 (×3): qty 1

## 2012-01-07 MED ORDER — CEPHALEXIN 500 MG PO CAPS
500.0000 mg | ORAL_CAPSULE | Freq: Three times a day (TID) | ORAL | Status: DC
  Administered 2012-01-07 – 2012-01-09 (×6): 500 mg via ORAL
  Filled 2012-01-07 (×6): qty 1

## 2012-01-07 MED ORDER — DIVALPROEX SODIUM ER 500 MG PO TB24
1000.0000 mg | ORAL_TABLET | Freq: Every day | ORAL | Status: DC
  Administered 2012-01-07 – 2012-01-08 (×2): 1000 mg via ORAL
  Filled 2012-01-07 (×3): qty 2

## 2012-01-07 MED ORDER — ASPIRIN EC 81 MG PO TBEC
81.0000 mg | DELAYED_RELEASE_TABLET | Freq: Every evening | ORAL | Status: DC
  Administered 2012-01-08 – 2012-01-09 (×2): 81 mg via ORAL
  Filled 2012-01-07 (×3): qty 1

## 2012-01-07 MED ORDER — ZOLPIDEM TARTRATE 5 MG PO TABS: 10.0000 mg | ORAL_TABLET | Freq: Every evening | ORAL | Status: DC | PRN

## 2012-01-07 MED ORDER — ALBUTEROL SULFATE HFA 108 (90 BASE) MCG/ACT IN AERS: 2.0000 | INHALATION_SPRAY | Freq: Four times a day (QID) | RESPIRATORY_TRACT | Status: DC | PRN

## 2012-01-07 MED ORDER — POLYETHYLENE GLYCOL 3350 17 G PO PACK
17.0000 g | PACK | Freq: Three times a day (TID) | ORAL | Status: DC
  Administered 2012-01-07: 17 g via ORAL
  Filled 2012-01-07 (×8): qty 1

## 2012-01-07 MED ORDER — FUROSEMIDE 20 MG PO TABS
20.0000 mg | ORAL_TABLET | Freq: Every day | ORAL | Status: DC
Start: 2012-01-07 — End: 2012-01-07

## 2012-01-07 MED ORDER — BUPRENORPHINE 10 MCG/HR TD PTWK: 10.0000 ug | MEDICATED_PATCH | TRANSDERMAL | Status: DC

## 2012-01-07 MED ORDER — ATORVASTATIN CALCIUM 40 MG PO TABS
40.0000 mg | ORAL_TABLET | Freq: Every day | ORAL | Status: DC
  Administered 2012-01-07 – 2012-01-08 (×2): 40 mg via ORAL
  Filled 2012-01-07 (×4): qty 1

## 2012-01-07 MED ORDER — ONDANSETRON HCL 4 MG PO TABS: 4.0000 mg | ORAL_TABLET | Freq: Three times a day (TID) | ORAL | Status: DC | PRN

## 2012-01-07 MED ORDER — NIACIN ER (ANTIHYPERLIPIDEMIC) 500 MG PO TBCR
500.0000 mg | EXTENDED_RELEASE_TABLET | Freq: Every evening | ORAL | Status: DC
  Administered 2012-01-08 – 2012-01-09 (×2): 500 mg via ORAL
  Filled 2012-01-07 (×3): qty 1

## 2012-01-07 MED ORDER — PANTOPRAZOLE SODIUM 40 MG PO TBEC
40.0000 mg | DELAYED_RELEASE_TABLET | Freq: Every day | ORAL | Status: DC
  Administered 2012-01-08 – 2012-01-09 (×2): 40 mg via ORAL
  Filled 2012-01-07 (×2): qty 1

## 2012-01-07 MED ORDER — ALUM & MAG HYDROXIDE-SIMETH 200-200-20 MG/5ML PO SUSP: 30.0000 mL | ORAL | Status: DC | PRN

## 2012-01-07 MED ORDER — ACETAMINOPHEN 325 MG PO TABS
650.0000 mg | ORAL_TABLET | ORAL | Status: DC | PRN
Start: 2012-01-07 — End: 2012-01-09

## 2012-01-07 MED ORDER — VERAPAMIL HCL ER 180 MG PO TBCR
180.0000 mg | EXTENDED_RELEASE_TABLET | Freq: Every day | ORAL | Status: DC
  Administered 2012-01-08 – 2012-01-09 (×2): 180 mg via ORAL
  Filled 2012-01-07 (×2): qty 1

## 2012-01-07 MED ORDER — DULOXETINE HCL 60 MG PO CPEP
60.0000 mg | ORAL_CAPSULE | Freq: Two times a day (BID) | ORAL | Status: DC
  Administered 2012-01-07 – 2012-01-09 (×4): 60 mg via ORAL
  Filled 2012-01-07 (×5): qty 1

## 2012-01-07 NOTE — ED Provider Notes (Cosign Needed)
History     CSN: [id number]  Arrival date & time 01/07/12  1315   First MD Initiated Contact with Patient 01/07/12 1507      Chief Complaint  Patient presents with  . V70.1   chief complaint hearing voices  (Consider location/radiation/quality/duration/timing/severity/associated sxs/prior treatment) HPI  Patient was seen by me at [facility] ED on August 6 for constipation after being started on a new narcotic for her chronic back pain. She was advised to her regimen of MiraLAX which she relates has resolved her constipation. She reports however about that same time she started hearing forces. She states she's heard voices many times before. She's been admitted twice this year to a psychiatric facility for hearing voices. The last time was in April at behavioral health. She states she just hears voices talking around her head she can't always hear what they are saying. She however states today her air concentrator told her "don't fall, don't fall, don't fall" and " stay in bed, stay in bed, stay in bed". She also reports stay with her sister and she sees a shelf on the wall containing pineapple and bananas that she wants to eat. She states however her sister states there's no shelf there. She states however she can touch the shelf. She denies voices tell her to hurt herself or hurt anybody else although they have in the past. Patient also was noted to be hearing voices from the radio today although the radio was not playing. She has an appointment with her psychiatrist on the 22nd and she has an appointment with her therapist tomorrow. She was seen in the ED on August 9 and had a tele-psych consult that recommended increasing her Thorazine to 25 mg 3 times a day.  PCP PA [facility] in [place] Psychiatrist Dr Lolly Mustache  Past Medical History  Diagnosis Date  . Hyperlipidemia   . CAD (coronary artery disease)   . Bipolar 1 disorder   . Schizophrenia   . GERD (gastroesophageal  reflux disease)   . Hyperthyroidism   . Chronic back pain   . Morbid obesity   . Dyspnea     PFT 03/05/09 FEV1 2.77(98%), FVC 3.25(86%), FEV1% 85, TLC 5.88(99%), DLCO 60% ,  Methacholine challenge 03/16/09 normal ,  CT chest 03/12/09 no pulmonary disease  . Anxiety   . Arthritis   . Depression   . OSA on CPAP     2 liters  . HTN (hypertension)   . History of colonoscopy 10/17/2002    by Dr Rehman-> distal non-specific proctitis, small ext hemorrhoids,   . Fungal infection   . Cellulitis   . Contusion of sacrum   . Migraine headache   . Vitamin d deficiency   . Sleep apnea   . Myocardial infarction     NOV 1997  . Hypothyroidism     States she only has hyperthyroidism  . Complication of anesthesia     States she typically gets sick s/p anesthesia    Past Surgical History  Procedure Date  . Cholecystectomy   . Knee arthroscopy   . Appendectomy   . Tonsillectomy   . Back surgery   . Total vaginal hysterectomy   . Abdominal hysterectomy     sept 1996  . Tubal ligation   . Colonoscopy 10/17/2002     Distal proctitis, small external hemorrhoids, otherwise/  normal colonoscopy. Suspect rectal bleeding secondary to hemorrhoids  . Esophagogastroduodenoscopy 03/18/09    fundic gland polyps/mild gastritis  .  Cardiac catheterization     nov 1997  . Joint replacement     bil knee replacement    Family History  Problem Relation Age of Onset  . Cancer Mother   . Hypertension Mother   . Coronary artery disease Father   . Hypertension Brother   . Coronary artery disease Brother   . Anesthesia problems Neg Hx   . Hypotension Neg Hx   . Malignant hyperthermia Neg Hx   . Pseudochol deficiency Neg Hx     History  Substance Use Topics  . Smoking status: Never Smoker   . Smokeless tobacco: Not on file  . Alcohol Use: No  lives with nephew Oxygen 3 lpm [place] at night CPAP at night  OB History    Grav Para Term Preterm Abortions TAB SAB Ect Mult Living                   Review of Systems  All other systems reviewed and are negative.    Allergies  Ativan and Naproxen  Home Medications   Patient's Medications  New Prescriptions   No medications on file  Previous Medications   ALBUTEROL (VENTOLIN HFA) 108 (90 BASE) MCG/ACT INHALER    Inhale 2 puffs into the lungs every 6 (six) hours as needed. For shortness of breath   ASPIRIN 81 MG EC TABLET    Take 81 mg by mouth every evening. For prevention of stroke and heart attack   ATORVASTATIN (LIPITOR) 40 MG TABLET    Take 1 tablet (40 mg total) by mouth at bedtime. To help lower cholesterol.   BUPRENORPHINE (BUTRANS) 10 MCG/HR PTWK    Place 10 mcg onto the skin once a week. Swapped out on Saturdays.   CHLORPROMAZINE (THORAZINE) 25 MG TABLET    Take 50 mg by mouth 3 (three) times daily.    CHOLECALCIFEROL (VITAMIN D) 2000 UNITS TABLET    Take 1 tablet (2,000 Units total) by mouth every morning. For Vitamin D replacement and mood control.   DIVALPROEX (DEPAKOTE ER) 500 MG 24 HR TABLET    Take 2 tablets (1,000 mg total) by mouth at bedtime. For mood control.   DONEPEZIL (ARICEPT) 10 MG TABLET    Take 10 mg by mouth every morning.   DULOXETINE (CYMBALTA) 60 MG CAPSULE    Take 1 capsule (60 mg total) by mouth 2 (two) times daily. For management of pain and mood.   FUROSEMIDE (LASIX) 20 MG TABLET    Take 20-40 mg by mouth daily. Alternates between 1 and 2 tabs daily   LEVOTHYROXINE (SYNTHROID, LEVOTHROID) 125 MCG TABLET    Take 1 tablet (125 mcg total) by mouth every morning. For thyroid replacement.   MULTIPLE VITAMIN (MULTIVITAMIN WITH MINERALS) TABS    Take 1 tablet by mouth daily.   NIACIN (NIASPAN) 500 MG CR TABLET    Take 1 tablet (500 mg total) by mouth every evening. For nutritional supplementation.   NYSTATIN (MYCOSTATIN/NYSTOP) 100000 UNIT/GM POWD    Apply 1 g (100,000 Units total) topically 3 (three) times daily as needed (reddness and moisture). For yeast infection   OMEPRAZOLE (PRILOSEC) 20 MG  CAPSULE    Take 20 mg by mouth 2 (two) times daily. For acid reflux.   POLYETHYLENE GLYCOL (MIRALAX / GLYCOLAX) PACKET    Take 17 g by mouth 3 (three) times daily. For constipation   UNABLE TO FIND    as directed. Med Name: CPAP and OXYGEN used daily at bedtime  VERAPAMIL (COVERA HS) 180 MG (CO) 24 HR TABLET    Take 1 tablet (180 mg total) by mouth daily with breakfast. For your heart  Modified Medications   No medications on file  Discontinued Medications   CHLORPROMAZINE (THORAZINE) 10 MG TABLET    Take 1 tablet (10 mg total) by mouth 3 (three) times daily.     BP 131/79  Pulse 75  Temp 98.5 F (36.9 C) (Oral)  Resp 18  SpO2 97%  Vital signs normal   Physical Exam  Nursing note and vitals reviewed. Constitutional: She is oriented to person, place, and time. She appears well-developed and well-nourished.  Non-toxic appearance. She does not appear ill. No distress.       Morbidly obese  HENT:  Head: Normocephalic and atraumatic.  Right Ear: External ear normal.  Left Ear: External ear normal.  Nose: Nose normal. No mucosal edema or rhinorrhea.  Mouth/Throat: Oropharynx is clear and moist and mucous membranes are normal. No dental abscesses or uvula swelling.  Eyes: Conjunctivae and EOM are normal. Pupils are equal, round, and reactive to light.  Neck: Normal range of motion and full passive range of motion without pain. Neck supple.  Cardiovascular: Normal rate, regular rhythm and normal heart sounds.  Exam reveals no gallop and no friction rub.   No murmur heard. Pulmonary/Chest: Effort normal and breath sounds normal. No respiratory distress. She has no wheezes. She has no rhonchi. She has no rales. She exhibits no tenderness and no crepitus.  Abdominal: Soft. Normal appearance and bowel sounds are normal. She exhibits no distension. There is no tenderness. There is no rebound and no guarding.  Musculoskeletal: Normal range of motion. She exhibits no edema and no tenderness.        Moves all extremities well.   Neurological: She is alert and oriented to person, place, and time. She has normal strength. No cranial nerve deficit.  Skin: Skin is warm, dry and intact. No rash noted. No erythema. No pallor.  Psychiatric: She has a normal mood and affect. Her speech is normal and behavior is normal. Her mood appears not anxious.    ED Course  Procedures (including critical care time)  Patient continued on the Thorazine regimen 25 mg 3 times a day that was recommended this past week by the tele- psychiatrist.  17:14 Reita Cliche [facility] will evaluate patient.   Patient started on Keflex for possible UTI.  [facility] requesting thyroid studies to be done.    Results for orders placed during the hospital encounter of 01/07/12  CBC WITH DIFFERENTIAL      Component Value Range   WBC 8.5  4.0 - 10.5 K/uL   RBC 4.60  3.87 - 5.11 MIL/uL   Hemoglobin 13.9  12.0 - 15.0 g/dL   HCT 46.9  62.9 - 52.8 %   MCV 91.1  78.0 - 100.0 fL   MCH 30.2  26.0 - 34.0 pg   MCHC 33.2  30.0 - 36.0 g/dL   RDW 41.3  24.4 - 01.0 %   Platelets 137 (*) 150 - 400 K/uL   Neutrophils Relative 56  43 - 77 %   Neutro Abs 4.7  1.7 - 7.7 K/uL   Lymphocytes Relative 29  12 - 46 %   Lymphs Abs 2.5  0.7 - 4.0 K/uL   Monocytes Relative 11  3 - 12 %   Monocytes Absolute 1.0  0.1 - 1.0 K/uL   Eosinophils Relative 3  0 - 5 %  Eosinophils Absolute 0.3  0.0 - 0.7 K/uL   Basophils Relative 1  0 - 1 %   Basophils Absolute 0.1  0.0 - 0.1 K/uL  COMPREHENSIVE METABOLIC PANEL      Component Value Range   Sodium 138  135 - 145 mEq/L   Potassium 3.8  3.5 - 5.1 mEq/L   Chloride 99  96 - 112 mEq/L   CO2 32  19 - 32 mEq/L   Glucose, Bld 108 (*) 70 - 99 mg/dL   BUN 8  6 - 23 mg/dL   Creatinine, Ser 1.61  0.50 - 1.10 mg/dL   Calcium 9.6  8.4 - 09.6 mg/dL   Total Protein 7.1  6.0 - 8.3 g/dL   Albumin 3.6  3.5 - 5.2 g/dL   AST 38 (*) 0 - 37 U/L   ALT 39 (*) 0 - 35 U/L   Alkaline Phosphatase 120 (*) 39 - 117 U/L   Total  Bilirubin 0.4  0.3 - 1.2 mg/dL   GFR calc non Af Amer 78 (*) >90 mL/min   GFR calc Af Amer >90  >90 mL/min  ETHANOL      Component Value Range   Alcohol, Ethyl (B) <11  0 - 11 mg/dL  ACETAMINOPHEN LEVEL      Component Value Range   Acetaminophen (Tylenol), Serum <15.0  10 - 30 ug/mL  URINALYSIS, ROUTINE W REFLEX MICROSCOPIC      Component Value Range   Color, Urine YELLOW  YELLOW   APPearance CLOUDY (*) CLEAR   Specific Gravity, Urine 1.016  1.005 - 1.030   pH 7.5  5.0 - 8.0   Glucose, UA NEGATIVE  NEGATIVE mg/dL   Hgb urine dipstick NEGATIVE  NEGATIVE   Bilirubin Urine NEGATIVE  NEGATIVE   Ketones, ur NEGATIVE  NEGATIVE mg/dL   Protein, ur NEGATIVE  NEGATIVE mg/dL   Urobilinogen, UA 2.0 (*) 0.0 - 1.0 mg/dL   Nitrite NEGATIVE  NEGATIVE   Leukocytes, UA MODERATE (*) NEGATIVE  URINE RAPID DRUG SCREEN (HOSP PERFORMED)      Component Value Range   Opiates NONE DETECTED  NONE DETECTED   Cocaine NONE DETECTED  NONE DETECTED   Benzodiazepines POSITIVE (*) NONE DETECTED   Amphetamines NONE DETECTED  NONE DETECTED   Tetrahydrocannabinol NONE DETECTED  NONE DETECTED   Barbiturates NONE DETECTED  NONE DETECTED  URINE MICROSCOPIC-ADD ON      Component Value Range   Squamous Epithelial / LPF RARE  RARE   WBC, UA 11-20  <3 WBC/hpf   Bacteria, UA MANY (*) RARE  VALPROIC ACID LEVEL      Component Value Range   Valproic Acid Lvl 27.1 (*) 50.0 - 100.0 ug/mL   Laboratory interpretation all normal except subtherapeutic upper gases, possible UTI  Dg Abd Acute W/chest  01/02/2012  *RADIOLOGY REPORT*  Clinical Data: Abdominal pain and constipation.  ACUTE ABDOMEN SERIES (ABDOMEN 2 VIEW & CHEST 1 VIEW)  Comparison: 08/09/2010  Findings: Stable mild cardiomegaly noted.  Lungs appear clear.  No free intraperitoneal gas is evident beneath the hemidiaphragms.  The upright view only includes the upper abdomen, not extending below the iliac crests.  Prominence of stool throughout the colon suggests  constipation.  Lumbosacral posterolateral rod pedicle screw fixation noted.  IMPRESSION:  1. Prominence of stool throughout the colon suggests constipation. 2.  Stable mild cardiomegaly.  Original Report Authenticated By: Dellia Cloud, M.D.      1. Bipolar 1 disorder   2. Auditory hallucination  3. Visual hallucination     Plan awaiting psychiatric evaluation and placement.    Devoria Albe, MD, FACEP   MDM          Ward Givens, MD 01/07/12 [phone]

## 2012-01-07 NOTE — ED Notes (Addendum)
Pt cannot ambulate without cane, MD notified.

## 2012-01-07 NOTE — ED Notes (Signed)
ACT consultant at bedside.

## 2012-01-07 NOTE — ED Notes (Signed)
Spoke with RN Chari Manning to call security to wand visitor in pts room.

## 2012-01-07 NOTE — BH Assessment (Signed)
Assessment Note   Allison Sharp is an 56 y.o. female who presents volutnarily to [facility] with her sister Allison Sharp with chief complaints of SI with no plan and increasing hallucinations over the past 7 days.  Pt reports AH, VH, & tactile hallucinations. She says she hears garbled voices of several people and someone singing. During the assessment, she sees a sign on the floor waving and sees the TV set moving towards her and back again rapidly. She says she could feel a desk beside her bed that wasn't really there. Sister states that pt's increasing SI and hallucinations indicate pt is "in a downward spiral" and this is a pattern for her until meds are stabilized. Pt says she sees Dr. Lolly Mustache  And is complaint w/ her psych meds. No delusions noted. Pt endorses depressed mood with fatigue, insomnia (sleeping 1 hr per night), tearfulness, isolating, guilt, loss of interest, worthlessness, decreased grooming and staying in bed all day. She endorses moderate anxiety. Pt has been in [facility] on 1/30 13 and 09/18/11 for bipolar disorder and SI. Her affect is blunted. She can return home to live with her nephew. She denies HI and no hx of SA.    Axis I: 296.54 Bipolar I D/O, Most Recent Episode Depressed, Severe w/ Psychotic Features Axis II: Deferred Axis III:  Past Medical History  Diagnosis Date  . Hyperlipidemia   . CAD (coronary artery disease)   . Bipolar 1 disorder   . Schizophrenia   . GERD (gastroesophageal reflux disease)   . Hyperthyroidism   . Chronic back pain   . Morbid obesity   . Dyspnea     PFT 03/05/09 FEV1 2.77(98%), FVC 3.25(86%), FEV1% 85, TLC 5.88(99%), DLCO 60% ,  Methacholine challenge 03/16/09 normal ,  CT chest 03/12/09 no pulmonary disease  . Anxiety   . Arthritis   . Depression   . OSA on CPAP     2 liters  . HTN (hypertension)   . History of colonoscopy 10/17/2002    by Dr Rehman-> distal non-specific proctitis, small ext hemorrhoids,   . Fungal infection   . Cellulitis   .  Contusion of sacrum   . Migraine headache   . Vitamin d deficiency   . Sleep apnea   . Myocardial infarction     NOV 1997  . Hypothyroidism     States she only has hyperthyroidism  . Complication of anesthesia     States she typically gets sick s/p anesthesia   Axis IV: other psychosocial or environmental problems, problems related to social environment and problems with primary support group Axis V: 21-30 behavior considerably influenced by delusions or hallucinations OR serious impairment in judgment, communication OR inability to function in almost all areas  Past Medical History:  Past Medical History  Diagnosis Date  . Hyperlipidemia   . CAD (coronary artery disease)   . Bipolar 1 disorder   . Schizophrenia   . GERD (gastroesophageal reflux disease)   . Hyperthyroidism   . Chronic back pain   . Morbid obesity   . Dyspnea     PFT 03/05/09 FEV1 2.77(98%), FVC 3.25(86%), FEV1% 85, TLC 5.88(99%), DLCO 60% ,  Methacholine challenge 03/16/09 normal ,  CT chest 03/12/09 no pulmonary disease  . Anxiety   . Arthritis   . Depression   . OSA on CPAP     2 liters  . HTN (hypertension)   . History of colonoscopy 10/17/2002    by Dr Karilyn Cota distal non-specific  proctitis, small ext hemorrhoids,   . Fungal infection   . Cellulitis   . Contusion of sacrum   . Migraine headache   . Vitamin d deficiency   . Sleep apnea   . Myocardial infarction     NOV 1997  . Hypothyroidism     States she only has hyperthyroidism  . Complication of anesthesia     States she typically gets sick s/p anesthesia    Past Surgical History  Procedure Date  . Cholecystectomy   . Knee arthroscopy   . Appendectomy   . Tonsillectomy   . Back surgery   . Total vaginal hysterectomy   . Abdominal hysterectomy     sept 1996  . Tubal ligation   . Colonoscopy 10/17/2002     Distal proctitis, small external hemorrhoids, otherwise/  normal colonoscopy. Suspect rectal bleeding secondary to hemorrhoids  .  Esophagogastroduodenoscopy 03/18/09    fundic gland polyps/mild gastritis  . Cardiac catheterization     nov 1997  . Joint replacement     bil knee replacement    Family History:  Family History  Problem Relation Age of Onset  . Cancer Mother   . Hypertension Mother   . Coronary artery disease Father   . Hypertension Brother   . Coronary artery disease Brother   . Anesthesia problems Neg Hx   . Hypotension Neg Hx   . Malignant hyperthermia Neg Hx   . Pseudochol deficiency Neg Hx     Social History:  reports that she has never smoked. She does not have any smokeless tobacco history on file. She reports that she does not drink alcohol or use illicit drugs.  Additional Social History:  Alcohol / Drug Use Pain Medications: see med list Prescriptions: see PTA list Over the Counter: n/a History of alcohol / drug use?: No history of alcohol / drug abuse Longest period of sobriety (when/how long): na  CIWA: CIWA-Ar BP: 112/95 mmHg Pulse Rate: 63  COWS:    Allergies:  Allergies  Allergen Reactions  . Ativan (Lorazepam) Other (See Comments)    Delirium  . Naproxen Nausea And Vomiting    Home Medications:  (Not in a hospital admission)  OB/GYN Status:  No LMP recorded. Patient has had a hysterectomy.  General Assessment Data Location of Assessment: [facility] Living Arrangements: Other relatives Can pt return to current living arrangement?: Yes Admission Status: Voluntary Is patient capable of signing voluntary admission?: Yes Transfer from: Home Referral Source: Self/Family/Friend  Education Status Is patient currently in school?: No Current Grade: na Highest grade of school patient has completed: 1 Name of school: [employer] person: na  Risk to self Suicidal Ideation: Yes-Currently Present Suicidal Intent: No Is patient at risk for suicide?: No Suicidal Plan?: No Access to Means: No What has been your use of drugs/alcohol within the last 12  months?: none Previous Attempts/Gestures: Yes How many times?: 2  (OD on meds 1978 and 1994) Other Self Harm Risks: pt denies Triggers for Past Attempts: Family contact;Other (Comment) (relationship issues) Intentional Self Injurious Behavior: None Family Suicide History: No Recent stressful life event(s): Other (Comment) (pt states "everything" is stressful but doesn't give specifi) Persecutory voices/beliefs?: No Depression: Yes Depression Symptoms: Insomnia;Tearfulness;Isolating;Loss of interest in usual pleasures;Fatigue;Guilt;Feeling worthless/self pity Substance abuse history and/or treatment for substance abuse?: No Suicide prevention information given to non-admitted patients: Not applicable  Risk to Others Homicidal Ideation: No Thoughts of Harm to Others: No Current Homicidal Intent: No Current Homicidal  Plan: No Access to Homicidal Means: No Identified Victim: none History of harm to others?: No Assessment of Violence: None Noted Violent Behavior Description: na Does patient have access to weapons?: No Criminal Charges Pending?: No Does patient have a court date: No  Psychosis Hallucinations: Tactile;Auditory;Visual Delusions: None noted  Mental Status Report Appear/Hygiene: Other (Comment) (morbidly obese in gown b/c can't fit in scrubs ) Eye Contact: Good Motor Activity: Unable to assess Speech: Logical/coherent Level of Consciousness: Alert Mood: Depressed;Anxious Affect: Blunted Anxiety Level: Moderate Thought Processes: Relevant;Coherent Judgement: Impaired Orientation: Person;Place;Situation;Time Obsessive Compulsive Thoughts/Behaviors: None  Cognitive Functioning Concentration: Decreased Memory: Remote Impaired;Recent Impaired IQ: Average Insight: Poor Impulse Control: Fair Appetite: Poor Weight Loss: 0  Weight Gain: 75  (in last 6+ mos) Sleep: Decreased Total Hours of Sleep: 1  Vegetative Symptoms: Staying in bed;Decreased  grooming  ADLScreening [facility] Assessment Services) Patient's cognitive ability adequate to safely complete daily activities?: Yes Patient able to express need for assistance with ADLs?: Yes Independently performs ADLs?: Yes  Abuse/Neglect [facility]) Physical Abuse: Denies Verbal Abuse: Denies Sexual Abuse: Denies  Prior Inpatient Therapy Prior Inpatient Therapy: Yes Prior Therapy Dates: 1994 & 06/28/2011 & 09/18/11 Prior Therapy Facilty/Provider[facility]): [facility] Reason for Treatment: overdose/bipolar disorder  Prior Outpatient Therapy Prior Outpatient Therapy: Yes Prior Therapy Dates: currently Prior Therapy Facilty/Provider(s): Dr Luana Shu Reason for Treatment: bipolar disorder  ADL Screening (condition at time of admission) Patient's cognitive ability adequate to safely complete daily activities?: Yes Patient able to express need for assistance with ADLs?: Yes Independently performs ADLs?: Yes Weakness of Legs: None Weakness of Arms/Hands: None  Home Assistive Devices/Equipment Home Assistive Devices/Equipment: CPAP;Oxygen;Cane (specify quad or straight)    Abuse/Neglect Assessment (Assessment to be complete while patient is alone) Physical Abuse: Denies Verbal Abuse: Denies Sexual Abuse: Denies Exploitation of patient/patient's resources: Denies Self-Neglect: Denies Values / Beliefs Cultural Requests During Hospitalization: None Spiritual Requests During Hospitalization: None   Advance Directives (For Healthcare) Advance Directive: Patient does not have advance directive;Patient would not like information    Additional Information 1:1 In Past 12 Months?: No CIRT Risk: No Elopement Risk: No Does patient have medical clearance?: Yes     Disposition:  Disposition Disposition of Patient: Inpatient treatment program Type of inpatient treatment program: Adult  On Site Evaluation by:   Reviewed with Physician:     Donnamarie Rossetti P 01/07/2012 9:13 PM

## 2012-01-07 NOTE — ED Notes (Signed)
Pt in gown due to pt unable to fit in scrubs. RN Chari Manning notified.

## 2012-01-07 NOTE — ED Notes (Signed)
Pt here w/ sister. Pt states she is hearing voices, that her oxygen concentrator talks to her. She also describes visual hallucinations, thinking there is a shelf in her bedroom which contains a bag of dried pineapple on one shelf and a bag of dried bananas on the other shelf. Pt seen at [facility] 8/6 for constipation and was quite angry that she was not admitted. She was seen here 8/9 for psych eval and was not admitted. Pt and sister both quite verbal regarding not being admitted here for further evaluation.

## 2012-01-07 NOTE — ED Notes (Signed)
Placed red socks and fall risk bracelet on pt.  

## 2012-01-07 NOTE — ED Notes (Signed)
Pt, visitor, and belongings have been wanded.

## 2012-01-07 NOTE — ED Notes (Signed)
Pt has one bag of belongings. Pt's sister has one bag of belongings. Both bags are under desk behind nurses station.

## 2012-01-08 ENCOUNTER — Ambulatory Visit (HOSPITAL_COMMUNITY): Payer: Self-pay | Admitting: Psychiatry

## 2012-01-08 ENCOUNTER — Ambulatory Visit: Payer: Self-pay | Admitting: Gastroenterology

## 2012-01-08 LAB — TSH: TSH: 3.536 u[IU]/mL (ref 0.350–4.500)

## 2012-01-08 LAB — T4: T4, Total: 6.7 ug/dL (ref 5.0–12.5)

## 2012-01-08 NOTE — ED Notes (Signed)
Pharmacy notified of needs for scheduled meds.

## 2012-01-08 NOTE — ED Notes (Signed)
RT assessed CPAP.

## 2012-01-08 NOTE — ED Notes (Signed)
Pt states "do you know when someone will be around to see me?"; informed pt psychiatrist just came & he should be around shortly, pt verbalized understanding.

## 2012-01-08 NOTE — ED Notes (Signed)
Patient is resting comfortably. 

## 2012-01-08 NOTE — Progress Notes (Signed)
RT set patient up on BIPAP @ approximately 2400, per patient home settings of 11 IPAP, 4.5 EPAP, 3LO2.  Patient was resting comfortably and her vitals were stable.

## 2012-01-08 NOTE — ED Notes (Signed)
Bed:WA30<BR> Expected date:<BR> Expected time:<BR> Means of arrival:<BR> Comments:<BR> CLOSED

## 2012-01-08 NOTE — Progress Notes (Signed)
Patient did not tolerate full face mask. Wanted to try nasal mask. Patient states that the nasal mask is much more comfortable and that is the one she prefers.

## 2012-01-09 ENCOUNTER — Encounter (HOSPITAL_COMMUNITY): Payer: Self-pay | Admitting: Behavioral Health

## 2012-01-09 ENCOUNTER — Inpatient Hospital Stay (HOSPITAL_COMMUNITY)
Admission: AD | Admit: 2012-01-09 | Discharge: 2012-01-19 | DRG: 885 | Disposition: A | Discharge status: Home / Self Care | Payer: No Typology Code available for payment source | Source: Ambulatory Visit | Attending: Psychiatry | Admitting: Psychiatry

## 2012-01-09 ENCOUNTER — Telehealth (HOSPITAL_COMMUNITY): Payer: Self-pay | Admitting: *Deleted

## 2012-01-09 DIAGNOSIS — I219 Acute myocardial infarction, unspecified: Secondary | ICD-10-CM

## 2012-01-09 DIAGNOSIS — G8929 Other chronic pain: Secondary | ICD-10-CM | POA: Diagnosis present

## 2012-01-09 DIAGNOSIS — F319 Bipolar disorder, unspecified: Secondary | ICD-10-CM

## 2012-01-09 DIAGNOSIS — M129 Arthropathy, unspecified: Secondary | ICD-10-CM | POA: Diagnosis present

## 2012-01-09 DIAGNOSIS — K219 Gastro-esophageal reflux disease without esophagitis: Secondary | ICD-10-CM | POA: Diagnosis present

## 2012-01-09 DIAGNOSIS — F259 Schizoaffective disorder, unspecified: Principal | ICD-10-CM | POA: Diagnosis present

## 2012-01-09 DIAGNOSIS — I252 Old myocardial infarction: Secondary | ICD-10-CM

## 2012-01-09 DIAGNOSIS — Z7982 Long term (current) use of aspirin: Secondary | ICD-10-CM

## 2012-01-09 DIAGNOSIS — E7800 Pure hypercholesterolemia, unspecified: Secondary | ICD-10-CM

## 2012-01-09 DIAGNOSIS — M549 Dorsalgia, unspecified: Secondary | ICD-10-CM | POA: Diagnosis present

## 2012-01-09 DIAGNOSIS — F4310 Post-traumatic stress disorder, unspecified: Secondary | ICD-10-CM | POA: Diagnosis present

## 2012-01-09 DIAGNOSIS — F209 Schizophrenia, unspecified: Secondary | ICD-10-CM

## 2012-01-09 DIAGNOSIS — I2510 Atherosclerotic heart disease of native coronary artery without angina pectoris: Secondary | ICD-10-CM

## 2012-01-09 DIAGNOSIS — I10 Essential (primary) hypertension: Secondary | ICD-10-CM | POA: Diagnosis present

## 2012-01-09 DIAGNOSIS — F250 Schizoaffective disorder, bipolar type: Secondary | ICD-10-CM

## 2012-01-09 DIAGNOSIS — R079 Chest pain, unspecified: Secondary | ICD-10-CM | POA: Diagnosis not present

## 2012-01-09 DIAGNOSIS — E039 Hypothyroidism, unspecified: Secondary | ICD-10-CM

## 2012-01-09 DIAGNOSIS — E785 Hyperlipidemia, unspecified: Secondary | ICD-10-CM | POA: Diagnosis present

## 2012-01-09 DIAGNOSIS — Z79899 Other long term (current) drug therapy: Secondary | ICD-10-CM

## 2012-01-09 DIAGNOSIS — G4733 Obstructive sleep apnea (adult) (pediatric): Secondary | ICD-10-CM | POA: Diagnosis present

## 2012-01-09 DIAGNOSIS — Z6841 Body Mass Index (BMI) 40.0 and over, adult: Secondary | ICD-10-CM

## 2012-01-09 MED ORDER — PANTOPRAZOLE SODIUM 40 MG PO TBEC
40.0000 mg | DELAYED_RELEASE_TABLET | ORAL | Status: DC
  Administered 2012-01-09 – 2012-01-19 (×20): 40 mg via ORAL
  Filled 2012-01-09: qty 28
  Filled 2012-01-09 (×3): qty 1
  Filled 2012-01-09 (×2): qty 28
  Filled 2012-01-09 (×6): qty 1
  Filled 2012-01-09: qty 28
  Filled 2012-01-09 (×15): qty 1

## 2012-01-09 MED ORDER — DIVALPROEX SODIUM ER 500 MG PO TB24
1000.0000 mg | ORAL_TABLET | Freq: Every day | ORAL | Status: DC
  Administered 2012-01-09 – 2012-01-18 (×10): 1000 mg via ORAL
  Filled 2012-01-09 (×13): qty 2

## 2012-01-09 MED ORDER — POLYETHYLENE GLYCOL 3350 17 G PO PACK
17.0000 g | PACK | ORAL | Status: DC
  Administered 2012-01-11 – 2012-01-16 (×13): 17 g via ORAL
  Filled 2012-01-09 (×38): qty 1

## 2012-01-09 MED ORDER — VERAPAMIL HCL 180 MG (CO) PO TB24: 180.0000 mg | ORAL_TABLET | Freq: Every day | ORAL | Status: DC

## 2012-01-09 MED ORDER — FUROSEMIDE 20 MG PO TABS
30.0000 mg | ORAL_TABLET | Freq: Every day | ORAL | Status: DC
  Administered 2012-01-10 – 2012-01-11 (×2): 30 mg via ORAL
  Filled 2012-01-09 (×2): qty 1.5
  Filled 2012-01-09: qty 2
  Filled 2012-01-09 (×2): qty 1.5

## 2012-01-09 MED ORDER — ASPIRIN 81 MG PO CHEW
81.0000 mg | CHEWABLE_TABLET | Freq: Every day | ORAL | Status: DC
  Administered 2012-01-10 – 2012-01-19 (×10): 81 mg via ORAL
  Filled 2012-01-09 (×13): qty 1

## 2012-01-09 MED ORDER — NYSTATIN 100000 UNIT/GM EX POWD
1.0000 g | Freq: Three times a day (TID) | CUTANEOUS | Status: DC | PRN
  Administered 2012-01-19: 1 g via TOPICAL
  Filled 2012-01-09: qty 15

## 2012-01-09 MED ORDER — VITAMIN D 1000 UNITS PO TABS
2000.0000 [IU] | ORAL_TABLET | Freq: Every day | ORAL | Status: DC
  Administered 2012-01-10 – 2012-01-19 (×10): 2000 [IU] via ORAL
  Filled 2012-01-09 (×12): qty 2

## 2012-01-09 MED ORDER — ALUM & MAG HYDROXIDE-SIMETH 200-200-20 MG/5ML PO SUSP: 30.0000 mL | ORAL | Status: DC | PRN

## 2012-01-09 MED ORDER — ACETAMINOPHEN 325 MG PO TABS
650.0000 mg | ORAL_TABLET | Freq: Four times a day (QID) | ORAL | Status: DC | PRN
  Administered 2012-01-11: 650 mg via ORAL

## 2012-01-09 MED ORDER — DONEPEZIL HCL 10 MG PO TABS
10.0000 mg | ORAL_TABLET | Freq: Every day | ORAL | Status: DC
  Administered 2012-01-10 – 2012-01-18 (×9): 10 mg via ORAL
  Filled 2012-01-09 (×12): qty 1

## 2012-01-09 MED ORDER — ATORVASTATIN CALCIUM 40 MG PO TABS
40.0000 mg | ORAL_TABLET | Freq: Every day | ORAL | Status: DC
  Administered 2012-01-09 – 2012-01-18 (×10): 40 mg via ORAL
  Filled 2012-01-09 (×13): qty 1

## 2012-01-09 MED ORDER — LEVOTHYROXINE SODIUM 125 MCG PO TABS
125.0000 ug | ORAL_TABLET | Freq: Every day | ORAL | Status: DC
  Administered 2012-01-10 – 2012-01-19 (×10): 125 ug via ORAL
  Filled 2012-01-09 (×14): qty 1

## 2012-01-09 MED ORDER — MAGNESIUM HYDROXIDE 400 MG/5ML PO SUSP: 30.0000 mL | Freq: Every day | ORAL | Status: DC | PRN

## 2012-01-09 MED ORDER — NIACIN ER (ANTIHYPERLIPIDEMIC) 500 MG PO TBCR
500.0000 mg | EXTENDED_RELEASE_TABLET | Freq: Every evening | ORAL | Status: DC
  Administered 2012-01-10 – 2012-01-18 (×9): 500 mg via ORAL
  Filled 2012-01-09 (×11): qty 1

## 2012-01-09 MED ORDER — ADULT MULTIVITAMIN W/MINERALS CH
1.0000 | ORAL_TABLET | Freq: Every day | ORAL | Status: DC
  Administered 2012-01-10 – 2012-01-19 (×10): 1 via ORAL
  Filled 2012-01-09 (×13): qty 1

## 2012-01-09 MED ORDER — ALBUTEROL SULFATE HFA 108 (90 BASE) MCG/ACT IN AERS: 2.0000 | INHALATION_SPRAY | Freq: Four times a day (QID) | RESPIRATORY_TRACT | Status: DC | PRN

## 2012-01-09 MED ORDER — DULOXETINE HCL 60 MG PO CPEP
60.0000 mg | ORAL_CAPSULE | Freq: Two times a day (BID) | ORAL | Status: DC
  Administered 2012-01-10 – 2012-01-19 (×19): 60 mg via ORAL
  Filled 2012-01-09 (×8): qty 1
  Filled 2012-01-09: qty 28
  Filled 2012-01-09 (×4): qty 1
  Filled 2012-01-09: qty 28
  Filled 2012-01-09 (×2): qty 1
  Filled 2012-01-09: qty 28
  Filled 2012-01-09 (×8): qty 1
  Filled 2012-01-09: qty 28
  Filled 2012-01-09 (×2): qty 1

## 2012-01-09 MED ORDER — VERAPAMIL HCL ER 180 MG PO TBCR
180.0000 mg | EXTENDED_RELEASE_TABLET | Freq: Every day | ORAL | Status: DC
  Administered 2012-01-10 – 2012-01-11 (×2): 180 mg via ORAL
  Filled 2012-01-09 (×4): qty 1

## 2012-01-09 NOTE — ED Provider Notes (Addendum)
Act team indicates pt accepted to [facility], dr readling, bed ready. Recent ua noted, culture remains pending. Will start on keflex 500 qid in interim pending culture.   Suzi Roots, MD 01/09/12 1623  Suzi Roots, MD 01/09/12 [phone]

## 2012-01-09 NOTE — Telephone Encounter (Signed)
Spoke to the sister who told that patient has been acting bizarre in past 10 days.  She's been talking to herself.  She is having hallucination.  She was given pain medication last week and patient started to notice the symptoms.  Currently patient is in [place] [facility] however [facility].  She was seen by psychiatrist who with the medication management.  I recommend her sister to contact the inpatient psychiatry when patient transferred to unit.  She acknowledged.
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
## 2012-01-09 NOTE — ED Notes (Signed)
Gave pt belongings to sister.

## 2012-01-09 NOTE — Progress Notes (Signed)
D: Patient calm and pleasant.  Patient settleing in on the unit with no complaints. Patient is familiar with [facility] because she was discharged May 2013.  Patient rates depression and hopelessness 8/10.  Patient using cane and wheelchair on the unit.    A: Staff to monitor Q 15 mins for safety.  CPAP initiated along with 3L of oxygen.  Offered encouragement and support. Scheduled medications administered per MD orders.   R: Patient remains safe on the unit. Taking scheduled medications per orders but  refused miralax tonight.
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
## 2012-01-09 NOTE — Progress Notes (Signed)
Pt has been accepted to [facility] by Dr. Allena Katz, bed 406-1. EDP notified and is in agreement with the disposition. RN made aware to call report. All support paperwork has been completed and sent to [facility] for review. No further needs identified at this time.

## 2012-01-10 DIAGNOSIS — F4310 Post-traumatic stress disorder, unspecified: Secondary | ICD-10-CM

## 2012-01-10 DIAGNOSIS — F259 Schizoaffective disorder, unspecified: Principal | ICD-10-CM

## 2012-01-10 LAB — URINE CULTURE

## 2012-01-10 MED ORDER — CIPROFLOXACIN HCL 500 MG PO TABS
500.0000 mg | ORAL_TABLET | ORAL | Status: AC
  Administered 2012-01-10 – 2012-01-17 (×14): 500 mg via ORAL
  Filled 2012-01-10 (×17): qty 1

## 2012-01-10 MED ORDER — ENSURE COMPLETE PO LIQD
237.0000 mL | Freq: Every day | ORAL | Status: DC
  Administered 2012-01-10 – 2012-01-18 (×9): 237 mL via ORAL

## 2012-01-10 NOTE — Progress Notes (Signed)
Nutrition Brief Note  Patient identified on the Nutrition Risk Report for problems chewing/swallowing foods and/or liquids.   Body mass index is 59.00 kg/(m^2). Pt meets criteria for extreme class III obesity based on current BMI.   - Pt reports eating well PTA and 10 pound intentional weight loss in the past month. Pt reports for the past month she has had problems swallowing food, stating even 1 grain of rice she can get choked on. Pt reports she has been followed by an outpatient gastroenterologist and was supposed to follow up with him this week about this issue. Pt reports recently her partial got broken which makes chewing difficult. Pt states her family has been concerned with her worsening coughing with eating.   Intervention: Discussed with MD, recommend outpatient GI follow up. Pt interested in getting Ensure for additional nutrition as she reports she has not been eating well during admission - will order. Recommend pt continue to select soft foods at mealtimes.   No further nutrition intervention indicated at this time.   Dietitian# [phone]

## 2012-01-10 NOTE — Progress Notes (Signed)
[facility] Group Notes:  (Counselor/Nursing/MHT/Case Management/Adjunct)  01/10/2012 8:27 PM  Type of Therapy:  Wrap up group  Participation Level:  Active  Participation Quality:  Appropriate  Affect:  Appropriate  Cognitive:  Appropriate  Insight:  Good  Engagement in Group:  Good  Engagement in Therapy:  Good  Modes of Intervention:  Wrap up group  Summary of Progress/Problems: Shared that she had a good day to day and that she did complete her daily goal for today.  Allison Sharp 01/10/2012, 8:27 PM

## 2012-01-10 NOTE — Progress Notes (Signed)
01/10/2012         Time: 0930      Group Topic/Focus: The focus of this group is on discussing various aspects of wellness, balancing those aspects and exploring ways to increase the ability to experience wellness.  Participation Level: Active  Participation Quality: Appropriate  Affect: Depressed  Cognitive: Alert   Additional Comments: Patient flat, asking why she was placed on the 400 hall this admission. Patient came to group with encouragement from RT, was ambulating without assistive devices but chose to use her cane with encouragement. Patient appropriate throughout group, appears more anxious than on her previous admission, possible because of the unit she is on.   [name] 01/10/2012 12:43 PM

## 2012-01-10 NOTE — Progress Notes (Signed)
[facility] Group Notes:  (Counselor/Nursing/MHT/Case Management/Adjunct)  01/10/2012 3:07 PM  Type of Therapy:  Psycho-education group Participation Level:  Did Not Attend    Allison Sharp 01/10/2012, 3:07 PM

## 2012-01-10 NOTE — Progress Notes (Signed)
Currently resting quietly in bed with eyes closed. CPAP and O2 concentrator at 3L noted at bedside and appears to be functioning without issue. Respirations are even and unlabored. No acute distress noted. Safety has been maintained with Q15 minute observation. Will continue current POC.

## 2012-01-10 NOTE — Progress Notes (Signed)
Psychoeducational Group Note  Date:  01/10/2012 Time:  1100  Group Topic/Focus:  Personal Choices and Values:   The focus of this group is to help patients assess and explore the importance of values in their lives, how their values affect their decisions, how they express their values and what opposes their expression.  Participation Level:  Active  Participation Quality:  Appropriate, Sharing and Supportive  Affect:  Flat  Cognitive:  Appropriate  Insight:  Good  Engagement in Group:  Good  Additional Comments:  none  Rosaura Bolon M 01/10/2012, 11:38 AM
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
## 2012-01-10 NOTE — Progress Notes (Addendum)
Pt. Stated she feels very tired as did not sleep good and wanted to go back to bed. Facial expression is blank -pt denies Si or HI and contracts for safety. States her depression is a 1/10 and hopelessness a 1/10. Pt refused to attend morning group. Pt returned from lunch stating she felt very short of breath and was freezing cold. Pt temp. Was 98.8 and pulse oz was 97% on room air,. PA made aware. Pt insisted she wanted her C-pap machine and to be place don 2 liters nasal canula. Machine was taken into the room for patient.

## 2012-01-10 NOTE — Discharge Planning (Signed)
01/10/2012  Allison Sharp did not attend d/c planning group on this date. SW met with Allison Sharp individually at this time.   Met with Allison Sharp. In treatment team.  Allison Sharp stated she would like to follow-up with Dr. Lolly Mustache and Georgana Curio at [facility], [place].  Allison Sharp. Stated she is hearing voices but couldn't make them out, and she has been seeing stuff like furniture that is not there at home and knows this because her sister has told her it is not there.  Allison Sharp. Reported depression 10/10 and and anxiety 9-10/10.  SW will continue to assess for referrals.     Clarice Pole, LCASA 01/10/2012, 3:37 PM

## 2012-01-11 MED ORDER — VERAPAMIL HCL ER 180 MG PO TBCR
180.0000 mg | EXTENDED_RELEASE_TABLET | Freq: Every day | ORAL | Status: DC
  Administered 2012-01-12 – 2012-01-19 (×7): 180 mg via ORAL
  Filled 2012-01-11 (×10): qty 1

## 2012-01-11 MED ORDER — FUROSEMIDE 20 MG PO TABS
30.0000 mg | ORAL_TABLET | Freq: Every day | ORAL | Status: DC
  Administered 2012-01-12 – 2012-01-19 (×7): 30 mg via ORAL
  Filled 2012-01-11 (×10): qty 1.5

## 2012-01-11 MED ORDER — CHLORPROMAZINE HCL 25 MG PO TABS
25.0000 mg | ORAL_TABLET | Freq: Every day | ORAL | Status: DC
  Administered 2012-01-11: 25 mg via ORAL
  Filled 2012-01-11 (×3): qty 1

## 2012-01-11 MED ORDER — CHLORPROMAZINE HCL 10 MG PO TABS
10.0000 mg | ORAL_TABLET | ORAL | Status: DC
  Administered 2012-01-11 – 2012-01-13 (×3): 10 mg via ORAL
  Filled 2012-01-11 (×7): qty 1

## 2012-01-11 NOTE — Progress Notes (Signed)
Psychoeducational Group Note  Date:  01/11/2012 Time:  1100  Group Topic/Focus:  Self Esteem Action Plan:   The focus of this group is to help patients create a plan to continue to build self-esteem after discharge.  Participation Level:  Active  Participation Quality:  Drowsy  Affect:  Flat  Cognitive:  Appropriate  Insight:  Good  Engagement in Group:  Good  Additional Comments:  Pt stated she was not with it today, pt stated she feel tired.  Isla Pence M 01/11/2012, 12:27 PM

## 2012-01-11 NOTE — Tx Team (Signed)
Interdisciplinary Treatment Plan Update (Adult) Date: 01/11/2012  Time Reviewed: 3:23 PM  Progress in Treatment: Attending groups: Yes Participating in groups: Yes Taking medication as prescribed: Yes Tolerating medication: Yes Family/Significant othe contact made: Contact needed with pt. Sister to explore aftercare medication compliance and consistency Patient understands diagnosis: Yes Discussing patient identified problems/goals with staff: Yes Medical problems stabilized or resolved: Yes Denies suicidal/homicidal ideation: Yes Issues/concerns per patient self-inventory: None identified Other: N/A New problem(s) identified: None Identified Reason for Continuation of Hospitalization: Anxiety Depression Hallucinations Medication stabilization Interventions implemented related to continuation of hospitalization: mood stabilization, medication monitoring and adjustment, group therapy and psycho education, safety checks q 15 mins Additional comments: N/A Estimated length of stay:  Discharge Plan: SW is assessing for appropriate referrals.  New goal(s): N/A Review of initial/current patient goals per problem list:  1. Goal(s): Reduce depressive symptoms  Met: No  Target date: by discharge  As evidenced by: Reducing depression from a 10 to a 3 as reported by pt.  2. Goal (s): Eliminate Suicidal Ideation  Met: No  Target date: by discharge  As evidenced by: Eliminate suicidal ideation.  3. Goal(s): Reduce Psychosis  Met: No  Target date: by discharge  As evidenced by: Reduce psychotic symptoms to baseline, as reported by pt.  4. Goal(s):  Medication Stabilization  Met: No  Target date: by discharge  As evidenced by: Increased depakote levels. 4. Goal(s):  Address Aftercare Plan  Met: No  Target date: by discharge  As evidenced by:  Confirmation of referrals needed.  Explore referral to In-home services if needed.  Attendees: Patient: Allison Sharp   01/11/2012  3:27 PM  Family:    Physician: Franchot Gallo, MD 01/11/2012 3:23 PM   Nursing:    Case Manager: Clarice Pole, LCASA 01/11/2012 3:23 PM   Counselor: Veto Kemps, MT-BC 01/11/2012 3:23 PM   Other:  01/11/2012 3:23 PM   Other:    Other:    Other:    Scribe for Treatment Team:  Clarice Pole, LCASA 01/11/2012 3:23 PM

## 2012-01-11 NOTE — Progress Notes (Signed)
[facility] Group Notes:  (Counselor/Nursing/MHT/Case Management/Adjunct)  01/11/2012 3:41 PM  Type of Therapy:  Group Therapy 9:30, Music Therapy  1:15  Participation Level:  None  Participation Quality:  Drowsy  Affect:  Depressed  Cognitive:  Not enough interaction to assess  Insight:  Not enough interaction to assess  Engagement in Group:  None  Engagement in Therapy:  None  Modes of Intervention:  Education and Support  Summary of Progress/Problems: Patient came into group late. She was drowsy and did not participate.  Patient did not attend music therapy.   HartisAram Sharp 01/11/2012, 3:41 PM

## 2012-01-11 NOTE — Discharge Planning (Signed)
01/11/2012  SW met with Zetta Bills in discharge planning group.  SW found JAVIONNA LEDER to need contacts made on their behalf.  SW found DANICA CAMARENA has current service providers, and they are [facility] in [place] with Dr. Lolly Mustache and Gigi Gin for couseling.  SW found Denna Fryberger Rich to rate her depression at 10/10 and anxiety at 10/10 today.  SW will continue to assess for referrals.  Clarice Pole, LCASA 01/11/2012, 3:23 PM

## 2012-01-11 NOTE — Progress Notes (Signed)
[facility] MD Progress Note  01/11/2012 9:32 PM  Current Mental Status Per Physician:  Diagnosis:  Axis I: Schizoaffective Disorder - Depressed Type.  Posttraumatic Stress Disorder.   The patient was seen today and reports the following:   ADL's: Intact.  Sleep: The patient reports that she is having significant difficulty initiating and maintaining sleep, but according to staff she slept 6.75 hours last night. Appetite: The patient reports her appetite is improving..   Mild>(1-10) >Severe  Hopelessness (1-10): 10  Depression (1-10): 10  Anxiety (1-10): 10   Suicidal Ideation: The patient denies any current suicidal ideations today.  Plan: No  Intent: No  Means: No   Homicidal Ideation: The patient denies any homicidal ideations today.  Plan: No  Intent: No.  Means: No   General Appearance/Behavior: The patient was cooperative today with this provider but significantly depressed in appearance.  Eye Contact: Fair to Good.  Speech: Appropriate in rate and volume with no pressuring noted today.  Motor Behavior: wnl.  Level of Consciousness: Alert and Oriented x 3.  Mental Status: Alert and Oriented x 3.  Mood: Severely depressed.  Affect: Essentially flat.  Anxiety Level: Severe anxiety reported.  Thought Process: wnl.  Thought Content: The patient denies any current auditory  hallucinations today. The patient also denies any delusional thinking. She states however that she is experiencing "visual hallucinations" of seeing "little objects" moving past her. Perception: wnl.  Judgment: Good.  Insight: Good.  Cognition: Oriented to person, place and time.  Sleep:  Number of Hours: 6.75    Vital Signs:Blood pressure 114/75, pulse 90, temperature 98.2 F (36.8 C), temperature source Oral, resp. rate 20, height 5\' 8"  (1.727 m), weight 175.996 kg (388 lb).  Current Medications: Current Facility-Administered Medications  Medication Dose Route Frequency Provider Last Rate Last Dose    . acetaminophen (TYLENOL) tablet 650 mg  650 mg Oral Q6H PRN Curlene Labrum Dann Galicia, MD   650 mg at 01/11/12 1709  . albuterol (PROVENTIL HFA;VENTOLIN HFA) 108 (90 BASE) MCG/ACT inhaler 2 puff  2 puff Inhalation Q6H PRN Curlene Labrum Lajoya Dombek, MD      . alum & mag hydroxide-simeth (MAALOX/MYLANTA) 200-200-20 MG/5ML suspension 30 mL  30 mL Oral Q4H PRN Curlene Labrum Kadon Andrus, MD      . aspirin chewable tablet 81 mg  81 mg Oral Daily Curlene Labrum Gianne Shugars, MD   81 mg at 01/11/12 0811  . atorvastatin (LIPITOR) tablet 40 mg  40 mg Oral QHS Curlene Labrum Maryjo Ragon, MD   40 mg at 01/11/12 2127  . chlorproMAZINE (THORAZINE) tablet 10 mg  10 mg Oral BH-q8a2p Curlene Labrum Joliyah Lippens, MD   10 mg at 01/11/12 1410  . chlorproMAZINE (THORAZINE) tablet 25 mg  25 mg Oral QHS Curlene Labrum Lexus Shampine, MD   25 mg at 01/11/12 2127  . cholecalciferol (VITAMIN D) tablet 2,000 Units  2,000 Units Oral Q breakfast Ronny Bacon, MD   2,000 Units at 01/11/12 [phone]  . ciprofloxacin (CIPRO) tablet 500 mg  500 mg Oral BH-qamhs Curlene Labrum Saphyra Hutt, MD   500 mg at 01/11/12 2126  . divalproex (DEPAKOTE ER) 24 hr tablet 1,000 mg  1,000 mg Oral QHS Curlene Labrum Denton Derks, MD   1,000 mg at 01/11/12 2126  . donepezil (ARICEPT) tablet 10 mg  10 mg Oral QHS Curlene Labrum Wright Gravely, MD   10 mg at 01/11/12 2126  . DULoxetine (CYMBALTA) DR capsule 60 mg  60 mg Oral BID Ronny Bacon, MD   60 mg  at 01/11/12 1709  . feeding supplement (ENSURE COMPLETE) liquid 237 mL  237 mL Oral Q2000 Lavena Bullion, RD   237 mL at 01/11/12 2130  . furosemide (LASIX) tablet 30 mg  30 mg Oral Daily Curlene Labrum Obert Espindola, MD      . levothyroxine (SYNTHROID, LEVOTHROID) tablet 125 mcg  125 mcg Oral QAC breakfast Curlene Labrum Dixie Jafri, MD   125 mcg at 01/11/12 0630  . magnesium hydroxide (MILK OF MAGNESIA) suspension 30 mL  30 mL Oral Daily PRN Curlene Labrum Korby Ratay, MD      . multivitamin with minerals tablet 1 tablet  1 tablet Oral Daily Ronny Bacon, MD   1 tablet at 01/11/12 0810  . niacin (NIASPAN) CR tablet 500 mg   500 mg Oral QPM Curlene Labrum Keyvon Herter, MD   500 mg at 01/11/12 1708  . nystatin (MYCOSTATIN/NYSTOP) topical powder 1 g  1 g Topical TID PRN Curlene Labrum Elonda Giuliano, MD      . pantoprazole (PROTONIX) EC tablet 40 mg  40 mg Oral BH-qamhs Curlene Labrum Apryll Hinkle, MD   40 mg at 01/11/12 2127  . polyethylene glycol (MIRALAX / GLYCOLAX) packet 17 g  17 g Oral BH-[id number] Curlene Labrum Virna Livengood, MD   17 g at 01/11/12 2126  . verapamil (CALAN-SR) CR tablet 180 mg  180 mg Oral Daily Curlene Labrum Huy Majid, MD      . DISCONTD: furosemide (LASIX) tablet 30 mg  30 mg Oral Daily Curlene Labrum Nyree Yonker, MD   30 mg at 01/11/12 0810  . DISCONTD: verapamil (CALAN-SR) CR tablet 180 mg  180 mg Oral Daily Curlene Labrum Oluwatobi Visser, MD   180 mg at 01/11/12 1207   Lab Results: No results found for this or any previous visit (from the past 48 hour(s)).  Physical Findings: AIMS: Facial and Oral Movements Muscles of Facial Expression: None, normal Lips and Perioral Area: None, normal Jaw: None, normal Tongue: None, normal,Extremity Movements Upper (arms, wrists, hands, fingers): None, normal Lower (legs, knees, ankles, toes): None, normal, Trunk Movements Neck, shoulders, hips: None, normal, Overall Severity Severity of abnormal movements (highest score from questions above): None, normal Incapacitation due to abnormal movements: None, normal Patient's awareness of abnormal movements (rate only patient's report): No Awareness, Dental Status Current problems with teeth and/or dentures?: No Does patient usually wear dentures?: No   Review of Systems:  Neurological: The patient denies any headaches today. She denies any seizures or dizziness.  G.I.: The patient denies any constipation or G.I. Upset today.  Musculoskeletal: The patient reports some ongoing chronic musculoskeletal pain.   The patient was seen today and reports significant difficulty initiating and maintaining sleep.  However according to staff she slept 6.75 hours last night. She reports that  her appetite is improving and also reports ongoing severe feelings of sadness, anhedonia and depressed mood.  She denies any current suicidal or homicidal ideations and reports severe anxiety.  The patient denies any current auditory hallucinations today. The patient also denies any delusional thinking. She states however that she is experiencing "visual hallucinations" of seeing "little objects" moving past her. She states that she would like to be restarted on the medication Thorazine as she was taking prior to admission.  Treatment Plan Summary:  1. Daily contact with patient to assess and evaluate symptoms and progress in treatment.  2. Medication management  3. The patient will deny suicidal ideations or homicidal ideations for 48 hours prior to discharge and have a depression and anxiety rating of 3  or less. The patient will also deny any auditory or visual hallucinations or delusional thinking.  4. The patient will deny any symptoms of substance withdrawal at time of discharge.   Plan:  1. Will continue the patient on her current medications as listed above.  2. Will restart the patient on the medication Thorazine at 10 mgs po q am and 2 pm and 25 mgs po qhs as she was taking at home for anxiety and hallucinations. 3. Will continue C-PAP with O2 to be used at anytime the patient is laying down.  4. Laboratory Studies reviewed. 5. Will continue to monitor.  6. Will consider moving the patient to the [address] when a bed is available.  Jeneva Schweizer 01/11/2012, 9:32 PM

## 2012-01-11 NOTE — Progress Notes (Signed)
D:  Patient very sad and tearful today.  States that she is upset that a female patient keeps trying to talk to her and she does not want to interact with him.  She is up and attending groups, but is not as interactive today.  She complained of visual hallucinations this afternoon stating she was seeing flashing like movements. A:  Patient was reassured that the hallucinations are not real and cannot hurt her.  Dr. Allena Katz was notified.   R:  Very depressed today.  Receptive to education.  She has been able to keep herself distanced from the female patient.  She has been assertive in keeping away from him and states it has helped her to feel safer.

## 2012-01-11 NOTE — Progress Notes (Signed)
Psychoeducational Group Note  Date:  01/11/2012 Time:  2000  Group Topic/Focus:  karaoke  Participation Level:  Minimal  Participation Quality:  Appropriate  Affect:  Blunted  Cognitive:  Alert  Insight:  Limited  Engagement in Group:  Limited  Additional Comments:  Pt did attend karaoke, affect has been flat and blunted during group.   Kaleen Odea R 01/11/2012, 8:51 PM
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
## 2012-01-11 NOTE — Progress Notes (Signed)
D:Pt seems extremely sad & depressed. Pt.stated that she had a HA = 10 & decided to go back to bed & opted not to go to the cafeteria for supper. Pt. Resting in bed with C-PAP & O2 on.Pt. Continues to c/o  Of visual hallucinations that come in bursts.A: Pt given tylenol for HA ;Supported & encouraged; Continues on 15 minute checks. R: Pt safety maintained.

## 2012-01-12 LAB — COMPREHENSIVE METABOLIC PANEL
AST: 15 U/L (ref 0–37)
Albumin: 3.3 g/dL — ABNORMAL LOW (ref 3.5–5.2)
BUN: 9 mg/dL (ref 6–23)
Calcium: 9.2 mg/dL (ref 8.4–10.5)
Chloride: 102 mEq/L (ref 96–112)
Creatinine, Ser: 0.94 mg/dL (ref 0.50–1.10)
Total Bilirubin: 0.3 mg/dL (ref 0.3–1.2)

## 2012-01-12 LAB — URINALYSIS, ROUTINE W REFLEX MICROSCOPIC
Ketones, ur: NEGATIVE mg/dL
Nitrite: NEGATIVE
Protein, ur: NEGATIVE mg/dL
Urobilinogen, UA: 1 mg/dL (ref 0.0–1.0)

## 2012-01-12 LAB — URINE MICROSCOPIC-ADD ON

## 2012-01-12 MED ORDER — CHLORPROMAZINE HCL 25 MG PO TABS
25.0000 mg | ORAL_TABLET | Freq: Every evening | ORAL | Status: DC | PRN
  Administered 2012-01-12 – 2012-01-13 (×2): 25 mg via ORAL
  Filled 2012-01-12 (×5): qty 1

## 2012-01-12 NOTE — Progress Notes (Signed)
[facility] Group Notes:  (Counselor/Nursing/MHT/Case Management/Adjunct)  01/12/2012 2:40 PM  Type of Therapy:  Group Therapy  Participation Level:  Did Not Attend    Allison Sharp 01/12/2012, 2:40 PM

## 2012-01-12 NOTE — Progress Notes (Signed)
Allison Sharp IS ACUTELY SAD, DEPRESSED AND FLAT THIS MORNING. sHE TAKES HER MEDS AS ORDERED. SHE IS UNABEL TO IDENTIFY WHAT HAPPENED TO MAKE HER DECOMPLENSATE. SHE SPEAKS AND MOVES WITH PSYCHOOTOR RETARDATION.              A SHE ATTENDS HER GROUPS AND IS TRANSFERRED TO THE [address] md AND PT REQUEST.              r sAFETY IS MAINTAINED AND poc CONT pd rn bc

## 2012-01-12 NOTE — Progress Notes (Signed)
[facility] MD Progress Note  01/12/2012 6:15 PM  Current Mental Status Per Physician:  Diagnosis:  Axis I: Schizoaffective Disorder - Depressed Type.  Posttraumatic Stress Disorder.   The patient was seen today and reports the following:   ADL's: Intact.  Sleep: The patient reports that she is having ongoing significant difficulty initiating and maintaining sleep, but according to staff she again slept 6.75 hours last night.  Appetite: The patient reports her appetite is poor today.   Mild>(1-10) >Severe  Hopelessness (1-10): 10  Depression (1-10): 10  Anxiety (1-10): 10   Suicidal Ideation: The patient denies any current suicidal ideations today.  Plan: No  Intent: No  Means: No   Homicidal Ideation: The patient denies any homicidal ideations today.  Plan: No  Intent: No.  Means: No   General Appearance/Behavior: The patient remained cooperative today with this provider but significantly depressed in appearance.  Eye Contact: Fair to Good.  Speech: Appropriate in rate and volume with no pressuring noted today.  Motor Behavior: wnl.  Level of Consciousness: Alert and Oriented x 3.  Mental Status: Alert and Oriented x 3.  Mood: Severely depressed.  Affect: Essentially flat.  Anxiety Level: Severe anxiety reported.  Thought Process: wnl.  Thought Content: The patient denies any current auditory hallucinations today. The patient also denies any delusional thinking as well as resolution of her visual hallucinations. Perception: wnl.  Judgment: Good.  Insight: Good.  Cognition: Oriented to person, place and time.  Sleep:  Number of Hours: 6.75    Vital Signs:Blood pressure 132/85, pulse 85, temperature 97.6 F (36.4 C), temperature source Oral, resp. rate 18, height 5\' 8"  (1.727 m), weight 175.996 kg (388 lb).  Current Medications: Current Facility-Administered Medications  Medication Dose Route Frequency Provider Last Rate Last Dose  . acetaminophen (TYLENOL) tablet 650 mg   650 mg Oral Q6H PRN Curlene Labrum Arbie Reisz, MD   650 mg at 01/11/12 1709  . albuterol (PROVENTIL HFA;VENTOLIN HFA) 108 (90 BASE) MCG/ACT inhaler 2 puff  2 puff Inhalation Q6H PRN Curlene Labrum Talor Cheema, MD      . alum & mag hydroxide-simeth (MAALOX/MYLANTA) 200-200-20 MG/5ML suspension 30 mL  30 mL Oral Q4H PRN Curlene Labrum Aliegha Paullin, MD      . aspirin chewable tablet 81 mg  81 mg Oral Daily Curlene Labrum Rickell Wiehe, MD   81 mg at 01/12/12 0817  . atorvastatin (LIPITOR) tablet 40 mg  40 mg Oral QHS Curlene Labrum Cherice Glennie, MD   40 mg at 01/11/12 2127  . chlorproMAZINE (THORAZINE) tablet 10 mg  10 mg Oral BH-[id number] Curlene Labrum Joleigh Mineau, MD   10 mg at 01/12/12 0817  . chlorproMAZINE (THORAZINE) tablet 25 mg  25 mg Oral QHS,MR X 1 Shauntell Iglesia D Traeger Sultana, MD      . cholecalciferol (VITAMIN D) tablet 2,000 Units  2,000 Units Oral Q breakfast Ronny Bacon, MD   2,000 Units at 01/12/12 0816  . ciprofloxacin (CIPRO) tablet 500 mg  500 mg Oral BH-qamhs Curlene Labrum Daine Croker, MD   500 mg at 01/12/12 0817  . divalproex (DEPAKOTE ER) 24 hr tablet 1,000 mg  1,000 mg Oral QHS Curlene Labrum Mecca Guitron, MD   1,000 mg at 01/11/12 2126  . donepezil (ARICEPT) tablet 10 mg  10 mg Oral QHS Curlene Labrum Bobbe Quilter, MD   10 mg at 01/11/12 2126  . DULoxetine (CYMBALTA) DR capsule 60 mg  60 mg Oral BID Curlene Labrum Winta Barcelo, MD   60 mg at 01/12/12 1727  . feeding supplement (  ENSURE COMPLETE) liquid 237 mL  237 mL Oral Q2000 Lavena Bullion, RD   237 mL at 01/11/12 2130  . furosemide (LASIX) tablet 30 mg  30 mg Oral Daily Curlene Labrum Lathaniel Legate, MD   30 mg at 01/12/12 0816  . levothyroxine (SYNTHROID, LEVOTHROID) tablet 125 mcg  125 mcg Oral QAC breakfast Ronny Bacon, MD   125 mcg at 01/12/12 9604  . magnesium hydroxide (MILK OF MAGNESIA) suspension 30 mL  30 mL Oral Daily PRN Curlene Labrum Rage Beever, MD      . multivitamin with minerals tablet 1 tablet  1 tablet Oral Daily Curlene Labrum Callista Hoh, MD   1 tablet at 01/12/12 0817  . niacin (NIASPAN) CR tablet 500 mg  500 mg Oral QPM Curlene Labrum Audryna Wendt, MD    500 mg at 01/12/12 1727  . nystatin (MYCOSTATIN/NYSTOP) topical powder 1 g  1 g Topical TID PRN Ronny Bacon, MD      . pantoprazole (PROTONIX) EC tablet 40 mg  40 mg Oral BH-qamhs Curlene Labrum Marylouise Mallet, MD   40 mg at 01/12/12 0817  . polyethylene glycol (MIRALAX / GLYCOLAX) packet 17 g  17 g Oral [id number] Curlene Labrum Chalese Peach, MD   17 g at 01/12/12 0817  . verapamil (CALAN-SR) CR tablet 180 mg  180 mg Oral Daily Curlene Labrum Marielis Samara, MD   180 mg at 01/12/12 1433  . DISCONTD: chlorproMAZINE (THORAZINE) tablet 25 mg  25 mg Oral QHS Curlene Labrum Consuela Widener, MD   25 mg at 01/11/12 2127   Lab Results: No results found for this or any previous visit (from the past 48 hour(s)).  Physical Findings: AIMS: Facial and Oral Movements Muscles of Facial Expression: None, normal Lips and Perioral Area: None, normal Jaw: None, normal Tongue: None, normal,Extremity Movements Upper (arms, wrists, hands, fingers): None, normal Lower (legs, knees, ankles, toes): None, normal, Trunk Movements Neck, shoulders, hips: None, normal, Overall Severity Severity of abnormal movements (highest score from questions above): None, normal Incapacitation due to abnormal movements: None, normal Patient's awareness of abnormal movements (rate only patient's report): No Awareness, Dental Status Current problems with teeth and/or dentures?: No Does patient usually wear dentures?: No   Review of Systems:  Neurological: The patient denies any headaches today. She denies any seizures or dizziness.  G.I.: The patient denies any constipation or G.I. Upset today.  Musculoskeletal: The patient reports some ongoing chronic musculoskeletal pain.   The patient was seen today and reports significant difficulty initiating and maintaining sleep. However according to staff she once again slept 6.75 hours last night. She reports that her appetite is poor today and also reports ongoing severe feelings of sadness, anhedonia and depressed mood. She denies  any current suicidal or homicidal ideations and reports severe anxiety. The patient denies any current auditory hallucinations today or delusional thinking. She states that her visual hallucinations have resolved.  The patient denies any medication related side effects today and requests if it would be possible to order her nighttime Thorazine with a "repeat" if needed for sleep.  Treatment Plan Summary:  1. Daily contact with patient to assess and evaluate symptoms and progress in treatment.  2. Medication management  3. The patient will deny suicidal ideations or homicidal ideations for 48 hours prior to discharge and have a depression and anxiety rating of 3 or less. The patient will also deny any auditory or visual hallucinations or delusional thinking.  4. The patient will deny any symptoms of substance withdrawal at time of  discharge.   Plan:  1. Will continue the patient on her current medications as listed above.  2. Will increase the medication Thorazine to 10 mgs po q am and 2 pm and 25 mgs po qhs with a repeat - prn - for sleep. 3. Will continue C-PAP with O2 to be used at anytime the patient is laying down.  4. Laboratory Studies reviewed.  5. Will order a repeat UA as well as a repeat serum Depakote Level and CMP for this evening. 6. Will continue to monitor.  7. Will move the patient to the [address] today and will make a Do Not Admit once the patient's roommate discharges.  Lasharon Dunivan 01/12/2012, 6:15 PM

## 2012-01-12 NOTE — Progress Notes (Signed)
Report received from S.Johnson RN. Patient currently lying in bed asleep with c-pap and 3L O2. No distress noted, safety maintained, will continue to monitor.

## 2012-01-12 NOTE — Progress Notes (Signed)
Patient ID: Allison Sharp, female   DOB: 07-28-1955, 56 y.o.   MRN: [id number] D: Pt. In room, reports on this admission "the bottom feel out again." Pt. Rates depression at 10. A: Pt encouraged to continue to verbalize feelings and attend group. Staff will monitor q21min for safety. R: Pt. Is safe on the unit.

## 2012-01-12 NOTE — Progress Notes (Signed)
Adult Psychosocial Assessment Update Interdisciplinary Team  Previous [facility] admissions/discharges:  Admissions Discharges  Date:09/25/11 Date: 10/03/11  Date:06/28/11 Date:  Date: Date:  Date: Date:  Date: Date:   Changes since the last Psychosocial Assessment (including adherence to outpatient mental health and/or substance abuse treatment, situational issues contributing to decompensation and/or relapse). Increased depression, visual, auditory and tactile hallucinations, increase depression with insomnia, isolation, not getting out of bed, not taking care of HDL's             Discharge Plan 1. Will you be returning to the same living situation after discharge?   Yes:x No:      If no, what is your plan?    Lives with her nephew       2. Would you like a referral for services when you are discharged? Yes:     If yes, for what services?  No:   x    Patient will return to Dr. Lolly Mustache and Florencia Reasons       Summary and Recommendations (to be completed by the evaluator) Patient is a 56 year old white obese female with diagnosis of Bipolar D/O, most recent episode depressed, severe, with psychotic features. Patient was admitted after decompensation and depressive symptoms. She is experiencing auditory, visual and tactile hallucinations. She will benefit from crisis stabilization, medication evaluation, group therapy, and psycho-education groups to work on coping skills, case management for outpatient appointments and counselor to contact family for suicide prevention.                       Signature:  Veto Kemps, 01/12/2012 9:30 AM

## 2012-01-12 NOTE — Progress Notes (Signed)
Psychoeducational Group Note  Date:  01/12/2012 Time:  2000  Group Topic/Focus:  Wrap-Up Group:   The focus of this group is to help patients review their daily goal of treatment and discuss progress on daily workbooks.  Participation Level: Did Not Attend  Participation Quality:  Not Applicable  Affect:  Not Applicable  Cognitive:  Not Applicable  Insight:  Not Applicable  Engagement in Group: Not Applicable  Additional Comments:  The pt. did not feel well enough to attend group this evening.                         :

## 2012-01-12 NOTE — Progress Notes (Signed)
Pt laying in bed resting with eyes closed. Respirations even and unlabored. No distress noted.  

## 2012-01-12 NOTE — Progress Notes (Signed)
01/12/2012         Time: 0930      Group Topic/Focus: The focus of the group is on enhancing the patients' ability to cope with stressors by understanding what coping is, why it is important, the negative effects of stress and developing healthier coping skills. Patients practice [place] and discuss how exercise can be used as a healthy coping strategy.  Participation Level: Did not attend  Participation Quality: Not Applicable  Affect: Not Applicable  Cognitive: Not Applicable   Additional Comments: Patient in bed, using CPAP machine, reports poor sleep last night. RT provided patient with her Friday workbook for unit programming after group.    Mariene Dickerman 01/12/2012 12:05 PM

## 2012-01-13 LAB — VALPROIC ACID LEVEL: Valproic Acid Lvl: 61.9 ug/mL (ref 50.0–100.0)

## 2012-01-13 MED ORDER — OXYCODONE HCL 5 MG PO TABS
5.0000 mg | ORAL_TABLET | Freq: Four times a day (QID) | ORAL | Status: DC
  Administered 2012-01-13 – 2012-01-15 (×8): 5 mg via ORAL
  Filled 2012-01-13 (×8): qty 1

## 2012-01-13 MED ORDER — NON FORMULARY: Freq: Every day | Status: DC

## 2012-01-13 MED ORDER — CHLORPROMAZINE HCL 25 MG PO TABS
25.0000 mg | ORAL_TABLET | Freq: Every evening | ORAL | Status: DC | PRN
  Administered 2012-01-13 – 2012-01-15 (×4): 25 mg via ORAL
  Filled 2012-01-13 (×8): qty 1

## 2012-01-13 MED ORDER — ILOPERIDONE 4 MG PO TABS
2.0000 mg | ORAL_TABLET | Freq: Two times a day (BID) | ORAL | Status: DC
  Administered 2012-01-13 – 2012-01-16 (×6): 2 mg via ORAL
  Filled 2012-01-13 (×8): qty 1

## 2012-01-13 NOTE — Progress Notes (Signed)
Patient attended wrap up group this evening. She is complaint with her medications but reports that she has had auditory and visual hallucinations. She reports that she saw her mother. Patient appears sad and depressed. Support and encouragement offered, safety maintained on unit, will continue to monitor.

## 2012-01-13 NOTE — Progress Notes (Signed)
Psychoeducational Group Note  Date:  01/13/2012 Time:  2000  Group Topic/Focus:  Wrap-Up Group:   The focus of this group is to help patients review their daily goal of treatment and discuss progress on daily workbooks.  Participation Level:  Active  Participation Quality:  Attentive  Affect:  Appropriate  Cognitive:  Oriented  Insight:  Good  Engagement in Group:  Good  Additional Comments:  Pt said she was glad to be at [facility], Vonte Rossin Celcia 01/13/2012, 10:17 PM

## 2012-01-13 NOTE — Progress Notes (Signed)
Psychoeducational Group Note  Date:  01/13/2012 Time:    Group Topic/Focus:  Identifying Needs:   The focus of this group is to help patients identify their personal needs that have been historically problematic and identify healthy behaviors to address their needs.  Participation Level: Did Not Attend  Participation Quality:  Not Applicable  Affect:  Not Applicable  Cognitive:  Not Applicable  Insight:  Not Applicable  Engagement in Group: Not Applicable  Additional Comments:    Rich Brave 01/13/2012, 2:10 PM

## 2012-01-13 NOTE — Progress Notes (Signed)
[facility] Group Notes:  (Counselor/Nursing/MHT/Case Management/Adjunct)  01/13/2012 3:37 PM  Type of Therapy:  Group Therapy  Participation Level:  Did Not Attend  Allison Sharp 01/13/2012, 3:37 PM

## 2012-01-13 NOTE — Progress Notes (Signed)
[facility] MD Progress Note  01/13/2012 10:31 AM  Current Mental Status Per Physician:  Diagnosis:  Axis I: Schizoaffective Disorder - Depressed Type.  Posttraumatic Stress Disorder.   The patient was seen today and reports the following:   ADL's: Intact.  Sleep: Reports poor sleep and can't seem to wake up this AM.  Feels that the Throazine is knocking her out during the day.  She had failed on more than 4 antipsychotics, will add Fanapt during the day and keep the Thorazine at night. Appetite: The patient reports her appetite is poor today.   Mild>(1-10) >Severe  Hopelessness (1-10): 10  Depression (1-10): 10  Anxiety (1-10): 10   Suicidal Ideation: The patient denies any current suicidal ideations today.  Plan: No  Intent: No  Means: No   Homicidal Ideation: The patient denies any homicidal ideations today.  Plan: No  Intent: No.  Means: No   General Appearance/Behavior: The patient remained cooperative today with this provider but significantly depressed in appearance.  Eye Contact: Fair to Good.  Speech: Appropriate in rate and volume with no pressuring noted today.  Motor Behavior: wnl.  Level of Consciousness: Alert and Oriented x 3.  Mental Status: Alert and Oriented x 3.  Mood: Severely depressed.  Affect: Essentially flat.  Anxiety Level: Severe anxiety reported.  Thought Process: wnl.  Thought Content: The patient noters auditory hallucinations today. No delusions or visual hallucinations other than grief hallucinations about her parents Perception: wnl.  Judgment: Good.  Insight: Good.  Cognition: Oriented to person, place and time.  Sleep:  Number of Hours: 5.5    Vital Signs:Blood pressure 117/76, pulse 82, temperature 96.8 F (36 C), temperature source Oral, resp. rate 16, height 5\' 8"  (1.727 m), weight 175.996 kg (388 lb).  Current Medications: Current Facility-Administered Medications  Medication Dose Route Frequency Provider Last Rate Last Dose  .  acetaminophen (TYLENOL) tablet 650 mg  650 mg Oral Q6H PRN Curlene Labrum Readling, MD   650 mg at 01/11/12 1709  . albuterol (PROVENTIL HFA;VENTOLIN HFA) 108 (90 BASE) MCG/ACT inhaler 2 puff  2 puff Inhalation Q6H PRN Curlene Labrum Readling, MD      . alum & mag hydroxide-simeth (MAALOX/MYLANTA) 200-200-20 MG/5ML suspension 30 mL  30 mL Oral Q4H PRN Curlene Labrum Readling, MD      . aspirin chewable tablet 81 mg  81 mg Oral Daily Curlene Labrum Readling, MD   81 mg at 01/13/12 0902  . atorvastatin (LIPITOR) tablet 40 mg  40 mg Oral QHS Curlene Labrum Readling, MD   40 mg at 01/12/12 2120  . chlorproMAZINE (THORAZINE) tablet 25 mg  25 mg Oral QHS,MR X 1 Mike Craze, MD      . cholecalciferol (VITAMIN D) tablet 2,000 Units  2,000 Units Oral Q breakfast Ronny Bacon, MD   2,000 Units at 01/13/12 0901  . ciprofloxacin (CIPRO) tablet 500 mg  500 mg Oral BH-qamhs Curlene Labrum Readling, MD   500 mg at 01/13/12 0902  . divalproex (DEPAKOTE ER) 24 hr tablet 1,000 mg  1,000 mg Oral QHS Curlene Labrum Readling, MD   1,000 mg at 01/12/12 2121  . donepezil (ARICEPT) tablet 10 mg  10 mg Oral QHS Curlene Labrum Readling, MD   10 mg at 01/12/12 2121  . DULoxetine (CYMBALTA) DR capsule 60 mg  60 mg Oral BID Curlene Labrum Readling, MD   60 mg at 01/13/12 0900  . feeding supplement (ENSURE COMPLETE) liquid 237 mL  237 mL Oral Q2000  Lavena Bullion, RD   237 mL at 01/12/12 2124  . furosemide (LASIX) tablet 30 mg  30 mg Oral Daily Curlene Labrum Readling, MD   30 mg at 01/13/12 0901  . iloperidone (FANAPT) tablet 2 mg  2 mg Oral BID Mike Craze, MD      . levothyroxine (SYNTHROID, LEVOTHROID) tablet 125 mcg  125 mcg Oral QAC breakfast Ronny Bacon, MD   125 mcg at 01/13/12 0636  . magnesium hydroxide (MILK OF MAGNESIA) suspension 30 mL  30 mL Oral Daily PRN Curlene Labrum Readling, MD      . multivitamin with minerals tablet 1 tablet  1 tablet Oral Daily Ronny Bacon, MD   1 tablet at 01/13/12 0901  . niacin (NIASPAN) CR tablet 500 mg  500 mg Oral QPM Curlene Labrum Readling, MD    500 mg at 01/12/12 1727  . nystatin (MYCOSTATIN/NYSTOP) topical powder 1 g  1 g Topical TID PRN Curlene Labrum Readling, MD      . pantoprazole (PROTONIX) EC tablet 40 mg  40 mg Oral BH-qamhs Curlene Labrum Readling, MD   40 mg at 01/13/12 0902  . polyethylene glycol (MIRALAX / GLYCOLAX) packet 17 g  17 g Oral [id number] Curlene Labrum Readling, MD   17 g at 01/13/12 0900  . verapamil (CALAN-SR) CR tablet 180 mg  180 mg Oral Daily Curlene Labrum Readling, MD   180 mg at 01/13/12 0900  . DISCONTD: chlorproMAZINE (THORAZINE) tablet 10 mg  10 mg Oral BH-[id number] Curlene Labrum Readling, MD   10 mg at 01/13/12 0903  . DISCONTD: chlorproMAZINE (THORAZINE) tablet 25 mg  25 mg Oral QHS Curlene Labrum Readling, MD   25 mg at 01/11/12 2127  . DISCONTD: chlorproMAZINE (THORAZINE) tablet 25 mg  25 mg Oral QHS,MR X 1 Curlene Labrum Readling, MD   25 mg at 01/13/12 0140   Lab Results:  Results for orders placed during the hospital encounter of 01/09/12 (from the past 48 hour(s))  URINALYSIS, ROUTINE W REFLEX MICROSCOPIC     Status: Abnormal   Collection Time   01/12/12  6:39 PM      Component Value Range Comment   Color, Urine YELLOW  YELLOW    APPearance TURBID (*) CLEAR    Specific Gravity, Urine 1.012  1.005 - 1.030    pH 6.5  5.0 - 8.0    Glucose, UA NEGATIVE  NEGATIVE mg/dL    Hgb urine dipstick MODERATE (*) NEGATIVE    Bilirubin Urine NEGATIVE  NEGATIVE    Ketones, ur NEGATIVE  NEGATIVE mg/dL    Protein, ur NEGATIVE  NEGATIVE mg/dL    Urobilinogen, UA 1.0  0.0 - 1.0 mg/dL    Nitrite NEGATIVE  NEGATIVE    Leukocytes, UA MODERATE (*) NEGATIVE   URINE MICROSCOPIC-ADD ON     Status: Abnormal   Collection Time   01/12/12  6:39 PM      Component Value Range Comment   Squamous Epithelial / LPF MANY (*) RARE    WBC, UA 0-2  <3 WBC/hpf    RBC / HPF 0-2  <3 RBC/hpf    Bacteria, UA FEW (*) RARE    Casts HYALINE CASTS (*) NEGATIVE    Urine-Other AMORPHOUS URATES/PHOSPHATES   MUCOUS PRESENT  VALPROIC ACID LEVEL     Status: Normal   Collection Time     01/12/12  7:36 PM      Component Value Range Comment   Valproic Acid Lvl 61.9  50.0 -  100.0 ug/mL   COMPREHENSIVE METABOLIC PANEL     Status: Abnormal   Collection Time   01/12/12  7:36 PM      Component Value Range Comment   Sodium 140  135 - 145 mEq/L    Potassium 3.9  3.5 - 5.1 mEq/L    Chloride 102  96 - 112 mEq/L    CO2 33 (*) 19 - 32 mEq/L    Glucose, Bld 122 (*) 70 - 99 mg/dL    BUN 9  6 - 23 mg/dL    Creatinine, Ser 1.61  0.50 - 1.10 mg/dL    Calcium 9.2  8.4 - 09.6 mg/dL    Total Protein 6.4  6.0 - 8.3 g/dL    Albumin 3.3 (*) 3.5 - 5.2 g/dL    AST 15  0 - 37 U/L    ALT 14  0 - 35 U/L    Alkaline Phosphatase 83  39 - 117 U/L    Total Bilirubin 0.3  0.3 - 1.2 mg/dL    GFR calc non Af Amer 66 (*) >90 mL/min    GFR calc Af Amer 77 (*) >90 mL/min     Physical Findings: AIMS: Facial and Oral Movements Muscles of Facial Expression: None, normal Lips and Perioral Area: None, normal Jaw: None, normal Tongue: None, normal,Extremity Movements Upper (arms, wrists, hands, fingers): None, normal Lower (legs, knees, ankles, toes): None, normal, Trunk Movements Neck, shoulders, hips: None, normal, Overall Severity Severity of abnormal movements (highest score from questions above): None, normal Incapacitation due to abnormal movements: None, normal Patient's awareness of abnormal movements (rate only patient's report): No Awareness, Dental Status Current problems with teeth and/or dentures?: No Does patient usually wear dentures?: No   Review of Systems:  Neurological: The patient denies any headaches today. She denies any seizures or dizziness.  G.I.: The patient denies any constipation or G.I. Upset today.  Musculoskeletal: The patient reports some ongoing chronic musculoskeletal pain.   Missed her 1400 dose of Thorazine yesterday. Am seeking clarification why from nursing.  Notes still having voices and seeing her parents.  This seems to be grief hallucinations. Notes pain  now that her bup   Treatment Plan Summary:  1. Daily contact with patient to assess and evaluate symptoms and progress in treatment.  2. Medication management  3. The patient will deny suicidal ideations or homicidal ideations for 48 hours prior to discharge and have a depression and anxiety rating of 3 or less. The patient will also deny any auditory or visual hallucinations or delusional thinking.  4. The patient will deny any symptoms of substance withdrawal at time of discharge.  5. The patient will report sustained pain control with BuTrans when we can get that restarted.  Plan:  1. Will continue the patient on her current medications as listed above.  2. Will keep Thorazine 25 mg po qhs with a repeat - prn - for sleep. 3. Will continue C-PAP with O2 to be used at anytime the patient is laying down.  4. Laboratory Studies reviewed.  5. Will order Fanapt in place of daytime Throazine.  She had failed on 4 other antipsychotics. 6. Will continue to monitor.   Syleena Mchan 01/13/2012, 10:31 AM

## 2012-01-13 NOTE — Progress Notes (Signed)
D Franki is acutely depressed...spending most of her day, alone, sleeping in her bed. She awakens periodically..comes to the med window and asks for meds and then returns to her bed. She makes little to no eye contact.She maintains a flat affect. She takes no interest in any activity in the hospital.             A She takes her medications as ordered. She completed her AM self inventory this morning and on it she wrote she denied SI, she rated her feelings of depression and hopelessness " 10 / 10 " and wrote: " I had nightmares last night, saw my mom and dad and heard old timey music" " it scared me".             R Safety is in place and POC moves forward with therapeutic relationship fostered PD RN [facility]

## 2012-01-13 NOTE — Progress Notes (Signed)
Psychoeducational Group Note  Date:  01/13/2012 Time:  1515  Group Topic/Focus:  Developing a Wellness Toolbox:   The focus of this group is to help patients develop a "wellness toolbox" with skills and strategies to promote recovery upon discharge.  Participation Level:  Active  Participation Quality:  Attentive  Affect:  Appropriate  Cognitive:  Oriented  Insight:  Good  Engagement in Group:  Good  Additional Comments:  Was very active  Allison Sharp 01/13/2012, 5:01 PM

## 2012-01-14 NOTE — Progress Notes (Signed)
[facility] Group Notes:  (Counselor/Nursing/MHT/Case Management/Adjunct)  01/14/2012 7:01 PM  Type of Therapy:  After Care Planning group  Pt. participated in after care planning group  And was given  SI pamphlet and crisis hot line numbers. The pt. Agreed to use them if needed. Pt. Was also given information about the [employer] and a list of free support groups located in [facility].  Each pt. Was encouraged to attend support groups out side of the hospital. The them book was introduced on Health support systems this morning in group and each pt. Was encourage to reach out and utilize their supports in times of crisis as well as becoming self supporting for themselves. Pt. Stated that she was okay today but came to hospital for hearing voices. Pt. Slept okay and denies SI/HI today.   Lamar Blinks [place] 01/14/2012, 7:01 PM

## 2012-01-14 NOTE — Progress Notes (Signed)
Writer entered patients room and observed her lying in bed asleep wearing her c-pap. Patient was easily aroused when her name was called. Patient was given her scheduled ensure. Patient reported that she had been up all day today and was tired being the reason she did not attend wrap up group. Patient currently denies having pain, -si/hi/a/v hall. Support and encouragement offered, patient informed that there were no changes to her medication and she was pleased. Safety maintained on unit, will continue to monitor.

## 2012-01-14 NOTE — Progress Notes (Signed)
[facility] MD Progress Note  01/14/2012 7:42 AM  Current Mental Status Per Physician:  Diagnosis:  Axis I: Schizoaffective Disorder - Depressed Type.  Posttraumatic Stress Disorder.   The patient was seen today and reports the following:   ADL's: Intact.  Sleep: Reports good sleep.  She woke up at 0630 and was seen by herself in the day room.Marland Kitchen Appetite: The patient reports her appetite is still poor today.   Suicidal Ideation: The patient denies any current suicidal ideations today.  Plan: No  Intent: No  Means: No   Homicidal Ideation: The patient denies any homicidal ideations today.  Plan: No  Intent: No.  Means: No   General Appearance/Behavior: The patient remained cooperative today with this provider but less depressed in appearance.  Eye Contact: Good.  Speech: Appropriate in rate and volume with no pressuring noted today.  Motor Behavior: wnl.  Level of Consciousness: Alert and Oriented x 3.  Mental Status: Alert and Oriented x 3.  Mood: Severely depressed.  Affect: Essentially flat.  Anxiety Level: Severe anxiety reported.  Thought Process: wnl.  Thought Content: The patient notes NO auditory hallucinations today. No delusions or visual hallucinations other than grief hallucinations about her parents Perception: wnl.  Judgment: Good.  Insight: Good.  Cognition: Oriented to person, place and time.  Sleep:  Number of Hours: 5.75    Vital Signs:Blood pressure 117/76, pulse 82, temperature 96.8 F (36 C), temperature source Oral, resp. rate 16, height 5\' 8"  (1.727 m), weight 175.996 kg (388 lb).  Current Medications: Current Facility-Administered Medications  Medication Dose Route Frequency Provider Last Rate Last Dose  . acetaminophen (TYLENOL) tablet 650 mg  650 mg Oral Q6H PRN Curlene Labrum Readling, MD   650 mg at 01/11/12 1709  . albuterol (PROVENTIL HFA;VENTOLIN HFA) 108 (90 BASE) MCG/ACT inhaler 2 puff  2 puff Inhalation Q6H PRN Curlene Labrum Readling, MD      . alum & mag  hydroxide-simeth (MAALOX/MYLANTA) 200-200-20 MG/5ML suspension 30 mL  30 mL Oral Q4H PRN Curlene Labrum Readling, MD      . aspirin chewable tablet 81 mg  81 mg Oral Daily Curlene Labrum Readling, MD   81 mg at 01/13/12 0902  . atorvastatin (LIPITOR) tablet 40 mg  40 mg Oral QHS Curlene Labrum Readling, MD   40 mg at 01/13/12 2201  . chlorproMAZINE (THORAZINE) tablet 25 mg  25 mg Oral QHS,MR X 1 Mike Craze, MD   25 mg at 01/13/12 2314  . cholecalciferol (VITAMIN D) tablet 2,000 Units  2,000 Units Oral Q breakfast Ronny Bacon, MD   2,000 Units at 01/13/12 0901  . ciprofloxacin (CIPRO) tablet 500 mg  500 mg Oral BH-qamhs Curlene Labrum Readling, MD   500 mg at 01/13/12 2202  . divalproex (DEPAKOTE ER) 24 hr tablet 1,000 mg  1,000 mg Oral QHS Curlene Labrum Readling, MD   1,000 mg at 01/13/12 2202  . donepezil (ARICEPT) tablet 10 mg  10 mg Oral QHS Curlene Labrum Readling, MD   10 mg at 01/13/12 2202  . DULoxetine (CYMBALTA) DR capsule 60 mg  60 mg Oral BID Curlene Labrum Readling, MD   60 mg at 01/13/12 1619  . feeding supplement (ENSURE COMPLETE) liquid 237 mL  237 mL Oral Q2000 Lavena Bullion, RD   237 mL at 01/13/12 2004  . furosemide (LASIX) tablet 30 mg  30 mg Oral Daily Curlene Labrum Readling, MD   30 mg at 01/13/12 0901  . iloperidone (FANAPT) tablet  2 mg  2 mg Oral BID Mike Craze, MD   2 mg at 01/13/12 1619  . levothyroxine (SYNTHROID, LEVOTHROID) tablet 125 mcg  125 mcg Oral QAC breakfast Ronny Bacon, MD   125 mcg at 01/14/12 [phone]  . magnesium hydroxide (MILK OF MAGNESIA) suspension 30 mL  30 mL Oral Daily PRN Curlene Labrum Readling, MD      . multivitamin with minerals tablet 1 tablet  1 tablet Oral Daily Ronny Bacon, MD   1 tablet at 01/13/12 0901  . niacin (NIASPAN) CR tablet 500 mg  500 mg Oral QPM Curlene Labrum Readling, MD   500 mg at 01/13/12 1619  . NON FORMULARY   Apply externally Daily Mike Craze, MD      . nystatin (MYCOSTATIN/NYSTOP) topical powder 1 g  1 g Topical TID PRN Curlene Labrum Readling, MD      . oxyCODONE (Oxy  IR/ROXICODONE) immediate release tablet 5 mg  5 mg Oral Q6H Mike Craze, MD   5 mg at 01/14/12 9604  . pantoprazole (PROTONIX) EC tablet 40 mg  40 mg Oral BH-qamhs Curlene Labrum Readling, MD   40 mg at 01/13/12 2202  . polyethylene glycol (MIRALAX / GLYCOLAX) packet 17 g  17 g Oral [id number] Curlene Labrum Readling, MD   17 g at 01/13/12 2201  . verapamil (CALAN-SR) CR tablet 180 mg  180 mg Oral Daily Curlene Labrum Readling, MD   180 mg at 01/13/12 0900  . DISCONTD: chlorproMAZINE (THORAZINE) tablet 10 mg  10 mg Oral BH-[id number] Curlene Labrum Readling, MD   10 mg at 01/13/12 0903  . DISCONTD: chlorproMAZINE (THORAZINE) tablet 25 mg  25 mg Oral QHS,MR X 1 Curlene Labrum Readling, MD   25 mg at 01/13/12 0140   Lab Results:  Results for orders placed during the hospital encounter of 01/09/12 (from the past 48 hour(s))  URINALYSIS, ROUTINE W REFLEX MICROSCOPIC     Status: Abnormal   Collection Time   01/12/12  6:39 PM      Component Value Range Comment   Color, Urine YELLOW  YELLOW    APPearance TURBID (*) CLEAR    Specific Gravity, Urine 1.012  1.005 - 1.030    pH 6.5  5.0 - 8.0    Glucose, UA NEGATIVE  NEGATIVE mg/dL    Hgb urine dipstick MODERATE (*) NEGATIVE    Bilirubin Urine NEGATIVE  NEGATIVE    Ketones, ur NEGATIVE  NEGATIVE mg/dL    Protein, ur NEGATIVE  NEGATIVE mg/dL    Urobilinogen, UA 1.0  0.0 - 1.0 mg/dL    Nitrite NEGATIVE  NEGATIVE    Leukocytes, UA MODERATE (*) NEGATIVE   URINE MICROSCOPIC-ADD ON     Status: Abnormal   Collection Time   01/12/12  6:39 PM      Component Value Range Comment   Squamous Epithelial / LPF MANY (*) RARE    WBC, UA 0-2  <3 WBC/hpf    RBC / HPF 0-2  <3 RBC/hpf    Bacteria, UA FEW (*) RARE    Casts HYALINE CASTS (*) NEGATIVE    Urine-Other AMORPHOUS URATES/PHOSPHATES   MUCOUS PRESENT  VALPROIC ACID LEVEL     Status: Normal   Collection Time   01/12/12  7:36 PM      Component Value Range Comment   Valproic Acid Lvl 61.9  50.0 - 100.0 ug/mL   COMPREHENSIVE METABOLIC PANEL      Status: Abnormal   Collection Time  01/12/12  7:36 PM      Component Value Range Comment   Sodium 140  135 - 145 mEq/L    Potassium 3.9  3.5 - 5.1 mEq/L    Chloride 102  96 - 112 mEq/L    CO2 33 (*) 19 - 32 mEq/L    Glucose, Bld 122 (*) 70 - 99 mg/dL    BUN 9  6 - 23 mg/dL    Creatinine, Ser 4.09  0.50 - 1.10 mg/dL    Calcium 9.2  8.4 - 81.1 mg/dL    Total Protein 6.4  6.0 - 8.3 g/dL    Albumin 3.3 (*) 3.5 - 5.2 g/dL    AST 15  0 - 37 U/L    ALT 14  0 - 35 U/L    Alkaline Phosphatase 83  39 - 117 U/L    Total Bilirubin 0.3  0.3 - 1.2 mg/dL    GFR calc non Af Amer 66 (*) >90 mL/min    GFR calc Af Amer 77 (*) >90 mL/min     Physical Findings: AIMS: Facial and Oral Movements Muscles of Facial Expression: None, normal Lips and Perioral Area: None, normal Jaw: None, normal Tongue: None, normal,Extremity Movements Upper (arms, wrists, hands, fingers): None, normal Lower (legs, knees, ankles, toes): None, normal, Trunk Movements Neck, shoulders, hips: None, normal, Overall Severity Severity of abnormal movements (highest score from questions above): None, normal Incapacitation due to abnormal movements: None, normal Patient's awareness of abnormal movements (rate only patient's report): No Awareness, Dental Status Current problems with teeth and/or dentures?: No Does patient usually wear dentures?: No   Review of Systems:  Neurological: No headaches, seizures or dizziness.  G.I.: Taking something for constipation and noting no change with the switch back to Oxy.  Musculoskeletal: no pain, aches, or weakness.   Treatment Plan Summary:  1. Daily contact with patient to assess and evaluate symptoms and progress in treatment.  2. Medication management  3. The patient will deny suicidal ideations or homicidal ideations for 48 hours prior to discharge and have a depression and anxiety rating of 3 or less. The patient will also deny any auditory or visual hallucinations or delusional  thinking.  4. The patient will deny any symptoms of substance withdrawal at time of discharge.  5. The patient will report sustained pain control with BuTrans when we can get that restarted.  Plan:  1. Will continue the patient on her current medications as listed above.  2. Will keep Thorazine 25 mg po qhs with a repeat - prn - for sleep. 3. Will continue C-PAP with O2 to be used at anytime the patient is laying down.  4. Laboratory Studies reviewed.  5. Will continuer Fanapt during the day. 6. Will continue to monitor.  7. Pt thought that her niece driving here and picking up a BuTrans patch and bringing it back here was totally ridiculous.  Pt also thought going home today was totally ridiculous, too.  She is looking forward to participating in group and other programming here today.  May be ready for D/C tomorrow.  Jayke Caul 01/14/2012, 7:42 AM

## 2012-01-14 NOTE — Progress Notes (Signed)
[facility] Group Notes:  (Counselor/Nursing/MHT/Case Management/Adjunct)  01/14/2012 7:24 PM  Type of Therapy:  Group Therapy  Participation Level:  Active  Participation Quality:  Appropriate and Attentive  Affect:  Appropriate  Cognitive:  Appropriate  Insight:  Good  Engagement in Group:  Good  Engagement in Therapy:  Good  Modes of Intervention:  Clarification, Education, Socialization and Support  Summary of Progress/Problems: Pt. participated in group on finding and having healthy supports systems in their lives after they have been discharged. Each pt. Stated what support meant to them and stated who a support was in their life. Patients who did not have may supports were encouraged to find healthy supports through local supports groups and  Places within there communities. Each pt. participated in activity on what it means to feel support verses having someone just tell you that they will be a support to you. The pt. Spoke about a support being someone who doe snot judge you. Pt. Spoke about friends and family being a support  Neila Gear 01/14/2012, 7:24 PM

## 2012-01-14 NOTE — Progress Notes (Signed)
Psychoeducational Group Note  Date:  01/14/2012 Time:  1515  Group Topic/Focus:  Self Care:   The focus of this group is to help patients understand the importance of self-care in order to improve or restore emotional, physical, spiritual, interpersonal, and financial health.  Participation Level:  Active  Participation Quality:  Appropriate  Affect:  Appropriate  Cognitive:  Oriented  Insight:  Good  Engagement in Group:  Good  Additional Comments:  Pt said she is learning to take care of herself  Starleen Arms 01/14/2012, 7:15 PM

## 2012-01-14 NOTE — Progress Notes (Signed)
Allison Sharp has been out of her room many times today. She has made pleasant, casual conversation. She has attended groups, she has taken her meals in the cafe'...she has spoken with staff many different times. She even has made jokes with this nurse.                                       A She takes her meds as ordered. SHe agrees with her physician and this writer that she feels better today and is acting less depressed. She completed her self inventory and on it she wrote she denied SI, she rated her depression and hopelessness " 9/9" and stated her DC plan was to " get out more".                                       R Safety is in place, poc includes continuing to foster therapeutic relationship PD RN [facility]

## 2012-01-14 NOTE — Progress Notes (Signed)
Psychoeducational Group Note  Date:  01/14/2012 Time:  2000  Group Topic/Focus:  Wrap-Up Group:   The focus of this group is to help patients review their daily goal of treatment and discuss progress on daily workbooks.  Participation Level: Did Not Attend  Participation Quality:  Not Applicable  Affect:  Not Applicable  Cognitive:  Not Applicable  Insight:  Not Applicable  Engagement in Group: Not Applicable  Additional Comments:  Pt. Did not feel well enough to attend group.   Westly Pam 01/14/2012, 9:58 PM[facility]HH Group Notes:  (Counselor/Nursing/MHT/Case Management/Adjunct)  01/14/2012 9:58 PM                    :

## 2012-01-15 MED ORDER — BUPRENORPHINE 10 MCG/HR TD PTWK: 10.0000 ug | MEDICATED_PATCH | TRANSDERMAL | Status: DC

## 2012-01-15 MED ORDER — BUPRENORPHINE 10 MCG/HR TD PTWK
10.0000 ug | MEDICATED_PATCH | Freq: Once | TRANSDERMAL | Status: DC
  Filled 2012-01-15: qty 1

## 2012-01-15 MED ORDER — NON FORMULARY: Freq: Every day | Status: DC

## 2012-01-15 MED ORDER — GRX ANALGESIC BALM EX OINT
1.0000 "application " | TOPICAL_OINTMENT | CUTANEOUS | Status: DC
  Administered 2012-01-15 – 2012-01-16 (×2): 1 via CUTANEOUS
  Filled 2012-01-15: qty 85
  Filled 2012-01-15: qty 28

## 2012-01-15 NOTE — Progress Notes (Signed)
Psychoeducational Group Note  Date:  01/15/2012 Time:  2025  Group Topic/Focus:  Wrap-Up Group:   The focus of this group is to help patients review their daily goal of treatment and discuss progress on daily workbooks.  Participation Level:  Active  Participation Quality:  Appropriate, Attentive, Drowsy, Sharing and Supportive  Affect:  Appropriate, Depressed and Flat  Cognitive:  Alert, Appropriate and Oriented  Insight:  Good  Engagement in Group:  Good  Additional Comments:  Patient attended and participated in wrap-up group this evening. Patient shared that she was tired all day and that she had a nice time visiting with her family today.  Domonick Sittner, Newton Pigg 01/15/2012, 9:00 PM

## 2012-01-15 NOTE — Progress Notes (Signed)
D: Pt denies SI/HI/AV. Pt is pleasant and cooperative. Pt rates depression at a 8, anxiety at a 2, and Helplessness/hopelessness at a 8. Pt when she returns home would to follow up with [facility] outpatient clinic in [place].  A: Pt was offered support and encouragement. Pt was given scheduled medications. Pt was encourage to attend groups. Q 15 minute checks were done for safety.  R:Pt attends groups and interacts well with peers and staff. Pt is taking medication. Pt has no complaints.Pt receptive to treatment and safety maintained on unit.

## 2012-01-15 NOTE — Progress Notes (Signed)
[facility] MD Progress Note  01/15/2012 12:28 PM  S: "I was hearing voices and seeing things last week. But it is better now, I hope that it continues to get better. I am sleeping well also. I wear C-pap machine at night as well. My appetite is not so good, I will try to go to lunch to see what I can grab. No suicidal thoughts".  Diagnosis:   Axis I: Post Traumatic Stress Disorder and Schizoaffective disorder, bipolar type Axis II: Deferred Axis III:  Past Medical History  Diagnosis Date  . Hyperlipidemia   . CAD (coronary artery disease)   . Bipolar 1 disorder   . Schizophrenia   . GERD (gastroesophageal reflux disease)   . Hyperthyroidism   . Chronic back pain   . Morbid obesity   . Dyspnea     PFT 03/05/09 FEV1 2.77(98%), FVC 3.25(86%), FEV1% 85, TLC 5.88(99%), DLCO 60% ,  Methacholine challenge 03/16/09 normal ,  CT chest 03/12/09 no pulmonary disease  . Anxiety   . Arthritis   . Depression   . OSA on CPAP     2 liters  . HTN (hypertension)   . History of colonoscopy 10/17/2002    by Dr Rehman-> distal non-specific proctitis, small ext hemorrhoids,   . Fungal infection   . Cellulitis   . Contusion of sacrum   . Migraine headache   . Vitamin d deficiency   . Sleep apnea   . Myocardial infarction     NOV 1997  . Hypothyroidism     States she only has hyperthyroidism  . Complication of anesthesia     States she typically gets sick s/p anesthesia   Axis IV: other psychosocial or environmental problems Axis V: 61-70 mild symptoms  ADL's:  Intact  Sleep: Good  Appetite:  Fair  Suicidal Ideation: "No" Plan:  No Intent:  No Means:  No Homicidal Ideation: "No" Plan:  No Intent:  No Means:  No  AEB (as evidenced by): per patient's report  Mental Status Examination/Evaluation: Objective:  Appearance: Casual, Obese  Eye Contact::  Good  Speech:  Clear and Coherent  Volume:  Normal  Mood:  "My mood is improving"  Affect:  Flat  Thought Process:  Coherent and Intact   Orientation:  Full  Thought Content:  Rumination  Suicidal Thoughts:  No  Homicidal Thoughts:  No  Memory:  Immediate;   Good Recent;   Good Remote;   Good  Judgement:  Fair  Insight:  Fair  Psychomotor Activity:  Normal  Concentration:  Good  Recall:  Good  Akathisia:  No  Handed:  Right  AIMS (if indicated):     Assets:  Desire for Improvement  Sleep:  Number of Hours: 6.75    Vital Signs:Blood pressure 108/67, pulse 73, temperature 97.9 F (36.6 C), temperature source Oral, resp. rate 16, height 5\' 8"  (1.727 m), weight 175.996 kg (388 lb). Current Medications: Current Facility-Administered Medications  Medication Dose Route Frequency Provider Last Rate Last Dose  . acetaminophen (TYLENOL) tablet 650 mg  650 mg Oral Q6H PRN Curlene Labrum Readling, MD   650 mg at 01/11/12 1709  . albuterol (PROVENTIL HFA;VENTOLIN HFA) 108 (90 BASE) MCG/ACT inhaler 2 puff  2 puff Inhalation Q6H PRN Curlene Labrum Readling, MD      . alum & mag hydroxide-simeth (MAALOX/MYLANTA) 200-200-20 MG/5ML suspension 30 mL  30 mL Oral Q4H PRN Curlene Labrum Readling, MD      . aspirin chewable tablet 81 mg  81 mg Oral Daily Curlene Labrum Readling, MD   81 mg at 01/15/12 0829  . atorvastatin (LIPITOR) tablet 40 mg  40 mg Oral QHS Curlene Labrum Readling, MD   40 mg at 01/14/12 2145  . chlorproMAZINE (THORAZINE) tablet 25 mg  25 mg Oral QHS,MR X 1 Mike Craze, MD   25 mg at 01/14/12 2146  . cholecalciferol (VITAMIN D) tablet 2,000 Units  2,000 Units Oral Q breakfast Ronny Bacon, MD   2,000 Units at 01/15/12 0829  . ciprofloxacin (CIPRO) tablet 500 mg  500 mg Oral BH-qamhs Curlene Labrum Readling, MD   500 mg at 01/15/12 0830  . divalproex (DEPAKOTE ER) 24 hr tablet 1,000 mg  1,000 mg Oral QHS Curlene Labrum Readling, MD   1,000 mg at 01/14/12 2145  . donepezil (ARICEPT) tablet 10 mg  10 mg Oral QHS Curlene Labrum Readling, MD   10 mg at 01/14/12 2145  . DULoxetine (CYMBALTA) DR capsule 60 mg  60 mg Oral BID Curlene Labrum Readling, MD   60 mg at 01/15/12 0830  .  feeding supplement (ENSURE COMPLETE) liquid 237 mL  237 mL Oral Q2000 Lavena Bullion, RD   237 mL at 01/14/12 2107  . furosemide (LASIX) tablet 30 mg  30 mg Oral Daily Curlene Labrum Readling, MD   30 mg at 01/15/12 0829  . iloperidone (FANAPT) tablet 2 mg  2 mg Oral BID Mike Craze, MD   2 mg at 01/15/12 0830  . levothyroxine (SYNTHROID, LEVOTHROID) tablet 125 mcg  125 mcg Oral QAC breakfast Ronny Bacon, MD   125 mcg at 01/15/12 [phone]  . magnesium hydroxide (MILK OF MAGNESIA) suspension 30 mL  30 mL Oral Daily PRN Curlene Labrum Readling, MD      . multivitamin with minerals tablet 1 tablet  1 tablet Oral Daily Ronny Bacon, MD   1 tablet at 01/15/12 0829  . niacin (NIASPAN) CR tablet 500 mg  500 mg Oral QPM Curlene Labrum Readling, MD   500 mg at 01/14/12 1847  . NON FORMULARY   Apply externally Daily Mike Craze, MD      . nystatin (MYCOSTATIN/NYSTOP) topical powder 1 g  1 g Topical TID PRN Curlene Labrum Readling, MD      . oxyCODONE (Oxy IR/ROXICODONE) immediate release tablet 5 mg  5 mg Oral Q6H Mike Craze, MD   5 mg at 01/15/12 1213  . pantoprazole (PROTONIX) EC tablet 40 mg  40 mg Oral BH-qamhs Curlene Labrum Readling, MD   40 mg at 01/15/12 0829  . polyethylene glycol (MIRALAX / GLYCOLAX) packet 17 g  17 g Oral [id number] Curlene Labrum Readling, MD   17 g at 01/15/12 0830  . verapamil (CALAN-SR) CR tablet 180 mg  180 mg Oral Daily Curlene Labrum Readling, MD   180 mg at 01/15/12 9604    Lab Results: No results found for this or any previous visit (from the past 48 hour(s)).  Physical Findings: AIMS: Facial and Oral Movements Muscles of Facial Expression: None, normal Lips and Perioral Area: None, normal Jaw: None, normal Tongue: None, normal,Extremity Movements Upper (arms, wrists, hands, fingers): None, normal Lower (legs, knees, ankles, toes): None, normal, Trunk Movements Neck, shoulders, hips: None, normal, Overall Severity Severity of abnormal movements (highest score from questions above): None,  normal Incapacitation due to abnormal movements: None, normal Patient's awareness of abnormal movements (rate only patient's report): No Awareness, Dental Status Current problems with teeth and/or dentures?: No  Does patient usually wear dentures?: No  CIWA:    COWS:     Treatment Plan Summary: Daily contact with patient to assess and evaluate symptoms and progress in treatment Medication management  Plan: Continue current treatment plan.  Armandina Stammer I 01/15/2012, 12:28 PM

## 2012-01-15 NOTE — Progress Notes (Signed)
[facility] Group Notes: (Counselor/Nursing/MHT/Case Management/Adjunct) 01/15/2012   @  1:15-2:30pm Overcoming Obstacles to Wellness   Type of Therapy:  Group Therapy  Participation Level:  Minimal  Participation Quality: Attentive    Affect:  Blunted  Cognitive:  Appropriate  Insight:  None  Engagement in Group: Minimal  Engagement in Therapy:  Minimal  Modes of Intervention:  Support and Exploration  Summary of Progress/Problems: Allison Sharp was attentive and nodded along with statements of other group members, but did not share her own experience.  [name], LCSW 01/15/2012  2:34 PM

## 2012-01-15 NOTE — Progress Notes (Signed)
Date: 01/15/2012        Time: 1145      Group Topic/Focus: Patient invited to participate in animal assisted therapy. Pets as a coping skill and responsibility were discussed.  Participation Level: Active  Participation Quality: Attentive  Affect: Blunted  Cognitive: Alert  Additional Comments: Patient continues to look very flat, reports that she is feeling tired today.  Nicolaas Savo 01/15/2012 12:30 PM

## 2012-01-15 NOTE — Progress Notes (Signed)
Psychoeducational Group Note  Date:  01/15/2012 Time:  1100  Group Topic/Focus:  Self Care:   The focus of this group is to help patients understand the importance of self-care in order to improve or restore emotional, physical, spiritual, interpersonal, and financial health.  Participation Level:  Active  Participation Quality:  Appropriate, Attentive and Sharing  Affect:  Appropriate  Cognitive:  Appropriate  Insight:  Good  Engagement in Group:  Good  Additional Comments:  Pt was appropriate and attentive. Pt shared that she had taken a vacation before being admitted to our facility and it helped with her stress level.  Sharyn Lull 01/15/2012, 11:50 AM

## 2012-01-15 NOTE — Progress Notes (Signed)
Patient seen during d/c planning group.  She currently denies A/VH  And SI/HI.  She rates symptoms at eight across the board.  She plans to return to her home and follow up with [facility] outpatient clinic in [place].  Patient asked that her sister be contacted for discharge planning.

## 2012-01-16 ENCOUNTER — Other Ambulatory Visit: Payer: Self-pay

## 2012-01-16 MED ORDER — BUPROPION HCL ER (SR) 150 MG PO TB12
150.0000 mg | ORAL_TABLET | Freq: Every day | ORAL | Status: DC
  Administered 2012-01-16 – 2012-01-19 (×4): 150 mg via ORAL
  Filled 2012-01-16 (×2): qty 1
  Filled 2012-01-16: qty 14
  Filled 2012-01-16: qty 1
  Filled 2012-01-16: qty 14
  Filled 2012-01-16 (×2): qty 1

## 2012-01-16 MED ORDER — CHLORPROMAZINE HCL 25 MG PO TABS
25.0000 mg | ORAL_TABLET | Freq: Every day | ORAL | Status: DC
  Administered 2012-01-16: 25 mg via ORAL
  Filled 2012-01-16 (×3): qty 1

## 2012-01-16 NOTE — Progress Notes (Signed)
Patient ID: Allison Sharp, female   DOB: 04/10/1956, 55 y.o.   MRN: [id number] Pt reports she slept well last night and her appetite is poor .  She did not eat breakfast and did eat her lunch.  Her energy level is low by her report and she appears very drowsy and has been sleeping most of the day.  She rouses easily but returns to sleep.  She does use a cane to ambulate when she gets up but has really only gotten up to see MD and to use the restroom.  She does use her C-pap when sleeping.  Pt c/o  chest pain in area of sternum.  She says she has had this before, had an EKG done and the MD was unable to determine the cause.  BP was 131/80, pulse was 78 and regular.  EKG done per MD order.

## 2012-01-16 NOTE — Progress Notes (Signed)
[facility] MD Progress Note  01/16/2012 11:51 AM  Current Mental Status Per Physician:  Diagnosis:  Axis I:  Schizoaffective Disorder - Depressed Type.   Posttraumatic Stress Disorder.   The patient was seen today and reports the following:   ADL's: Intact.  Sleep: The patient reports that she is having ongoing significant difficulty initiating and maintaining sleep, but according to staff she again slept 6.75 hours last night.  Appetite: The patient reports her appetite is poor today.   Mild>(1-10) >Severe  Hopelessness (1-10): 8  Depression (1-10): 8  Anxiety (1-10): 8   Suicidal Ideation: The patient denies any current suicidal ideations today.  Plan: No  Intent: No  Means: No   Homicidal Ideation: The patient denies any homicidal ideations today.  Plan: No  Intent: No.  Means: No   General Appearance/Behavior: The patient remained cooperative today with this provider but remained significantly depressed in appearance.  Eye Contact: Fair to Good.  Speech: Appropriate in rate and volume with no pressuring noted today.  Motor Behavior: wnl.  Level of Consciousness: Sedated but Oriented x 3.  Mental Status: Sedated but Oriented x 3.  Mood: Severely depressed.  Affect: Essentially flat.  Anxiety Level: Severe anxiety reported.  Thought Process: wnl.  Thought Content: The patient denies any current auditory hallucinations today. The patient also denies any delusional thinking today as well as any visual hallucinations.  Perception: wnl.  Judgment: Fair.  Insight: Fair.  Cognition: Oriented to person, place and time.  Sleep:  Number of Hours: 6    Vital Signs:Blood pressure 108/67, pulse 73, temperature 97.9 F (36.6 C), temperature source Oral, resp. rate 16, height 5\' 8"  (1.727 m), weight 175.996 kg (388 lb).  Current Medications: Current Facility-Administered Medications  Medication Dose Route Frequency Provider Last Rate Last Dose  . acetaminophen (TYLENOL) tablet 650 mg   650 mg Oral Q6H PRN Curlene Labrum Zaylon Bossier, MD   650 mg at 01/11/12 1709  . albuterol (PROVENTIL HFA;VENTOLIN HFA) 108 (90 BASE) MCG/ACT inhaler 2 puff  2 puff Inhalation Q6H PRN Curlene Labrum Diem Pagnotta, MD      . alum & mag hydroxide-simeth (MAALOX/MYLANTA) 200-200-20 MG/5ML suspension 30 mL  30 mL Oral Q4H PRN Curlene Labrum Keilly Fatula, MD      . aspirin chewable tablet 81 mg  81 mg Oral Daily Curlene Labrum Marialuisa Basara, MD   81 mg at 01/16/12 0844  . atorvastatin (LIPITOR) tablet 40 mg  40 mg Oral QHS Curlene Labrum Jaqlyn Gruenhagen, MD   40 mg at 01/15/12 0000  . buPROPion (WELLBUTRIN SR) 12 hr tablet 150 mg  150 mg Oral Daily Curlene Labrum Hairo Garraway, MD      . chlorproMAZINE (THORAZINE) tablet 25 mg  25 mg Oral QHS Curlene Labrum Janelli Welling, MD      . cholecalciferol (VITAMIN D) tablet 2,000 Units  2,000 Units Oral Q breakfast Ronny Bacon, MD   2,000 Units at 01/16/12 [phone]  . ciprofloxacin (CIPRO) tablet 500 mg  500 mg Oral BH-qamhs Curlene Labrum Rance Smithson, MD   500 mg at 01/16/12 0844  . divalproex (DEPAKOTE ER) 24 hr tablet 1,000 mg  1,000 mg Oral QHS Curlene Labrum Sherelle Castelli, MD   1,000 mg at 01/15/12 2156  . donepezil (ARICEPT) tablet 10 mg  10 mg Oral QHS Curlene Labrum Leonela Kivi, MD   10 mg at 01/15/12 2156  . DULoxetine (CYMBALTA) DR capsule 60 mg  60 mg Oral BID Curlene Labrum Anushree Dorsi, MD   60 mg at 01/16/12 0844  . feeding  supplement (ENSURE COMPLETE) liquid 237 mL  237 mL Oral Q2000 Lavena Bullion, RD   237 mL at 01/15/12 2000  . furosemide (LASIX) tablet 30 mg  30 mg Oral Daily Curlene Labrum Chalyn Amescua, MD   30 mg at 01/16/12 0843  . GRX ANALGESIC BALM OINT 1 application  1 application Apply externally [id number] Ronny Bacon, MD   1 application at 01/16/12 0800  . levothyroxine (SYNTHROID, LEVOTHROID) tablet 125 mcg  125 mcg Oral QAC breakfast Ronny Bacon, MD   125 mcg at 01/16/12 1610  . magnesium hydroxide (MILK OF MAGNESIA) suspension 30 mL  30 mL Oral Daily PRN Curlene Labrum Leticia Coletta, MD      . multivitamin with minerals tablet 1 tablet  1 tablet Oral Daily Ronny Bacon, MD   1 tablet at 01/16/12 0844  . niacin (NIASPAN) CR tablet 500 mg  500 mg Oral QPM Curlene Labrum Cayleigh Paull, MD   500 mg at 01/15/12 1607  . nystatin (MYCOSTATIN/NYSTOP) topical powder 1 g  1 g Topical TID PRN Curlene Labrum Nikko Quast, MD      . pantoprazole (PROTONIX) EC tablet 40 mg  40 mg Oral BH-qamhs Curlene Labrum Doxie Augenstein, MD   40 mg at 01/16/12 0843  . polyethylene glycol (MIRALAX / GLYCOLAX) packet 17 g  17 g Oral [id number] Curlene Labrum Lindwood Mogel, MD   17 g at 01/16/12 0845  . verapamil (CALAN-SR) CR tablet 180 mg  180 mg Oral Daily Curlene Labrum Halley Shepheard, MD   180 mg at 01/16/12 0845  . DISCONTD: buprenorphine (BUTRANS - dosed mcg/hr) patch 10 mcg  10 mcg Transdermal Weekly Curlene Labrum Elbridge Magowan, MD      . DISCONTD: buprenorphine (BUTRANS - dosed mcg/hr) patch 10 mcg  10 mcg Transdermal Weekly Curlene Labrum Deyanira Fesler, MD      . DISCONTD: buprenorphine (BUTRANS - dosed mcg/hr) patch 10 mcg  10 mcg Transdermal Once Ronny Bacon, MD      . DISCONTD: buprenorphine (BUTRANS - dosed mcg/hr) patch 10 mcg  10 mcg Transdermal Weekly Curlene Labrum Alycen Mack, MD      . DISCONTD: chlorproMAZINE (THORAZINE) tablet 25 mg  25 mg Oral QHS,MR X 1 Mike Craze, MD   25 mg at 01/15/12 2156  . DISCONTD: iloperidone (FANAPT) tablet 2 mg  2 mg Oral BID Mike Craze, MD   2 mg at 01/16/12 0845  . DISCONTD: NON FORMULARY   Apply externally Daily Mike Craze, MD      . DISCONTD: NON FORMULARY   Apply externally Daily Ronny Bacon, MD      . DISCONTD: oxyCODONE (Oxy IR/ROXICODONE) immediate release tablet 5 mg  5 mg Oral Q6H Mike Craze, MD   5 mg at 01/15/12 1213   Lab Results: No results found for this or any previous visit (from the past 48 hour(s)).  Physical Findings: AIMS: Facial and Oral Movements Muscles of Facial Expression: None, normal Lips and Perioral Area: None, normal Jaw: None, normal Tongue: None, normal,Extremity Movements Upper (arms, wrists, hands, fingers): None, normal Lower (legs, knees, ankles, toes):  None, normal, Trunk Movements Neck, shoulders, hips: None, normal, Overall Severity Severity of abnormal movements (highest score from questions above): None, normal Incapacitation due to abnormal movements: None, normal Patient's awareness of abnormal movements (rate only patient's report): No Awareness, Dental Status Current problems with teeth and/or dentures?: No Does patient usually wear dentures?: No   Review of Systems:  Neurological: The patient denies any headaches today. She  denies any seizures or dizziness.  G.I.: The patient denies any constipation or G.I. Upset today.  Musculoskeletal: The patient reports some ongoing chronic musculoskeletal pain but in fair to good control today.   The patient was seen today by this provider and reports to sleeping well but feeling significantly oversedated during the day.  The patient reports a decreased appetite and reports severe feelings of sadness, anhedonia and depressed mood.  The patient denies any suicidal or homicidal ideations today.  The patient reports severe anxiety today but also denies any auditory or visual hallucinations or delusional thinking.  It appears that the patient was started on Fanapt over the weekend and this will be discontinued today.   Treatment Plan Summary:  1. Daily contact with patient to assess and evaluate symptoms and progress in treatment.  2. Medication management  3. The patient will deny suicidal ideations or homicidal ideations for 48 hours prior to discharge and have a depression and anxiety rating of 3 or less. The patient will also deny any auditory or visual hallucinations or delusional thinking.  4. The patient will deny any symptoms of substance withdrawal at time of discharge.   Plan:  1. Will continue the patient on her current medications as listed above.  2. Will decrease the medication Thorazine to 25 mgs po qhs without a repeat for sleep.  3. Will discontinue the medication Fanapt today due  to oversedation. 4. Will start the patient on the medication Wellbutrin SR at 150 mgs po q am to also address her depressive symptoms. 5. Will continue C-PAP with O2 to be used at anytime the patient is laying down.  6. Laboratory Studies reviewed.  7. Will continue to monitor.   Emmanuela Ghazi 01/16/2012, 11:51 AM

## 2012-01-16 NOTE — Progress Notes (Signed)
Psychoeducational Group Note  Date:  01/16/2012 Time:  1100  Group Topic/Focus:  Recovery Goals:   The focus of this group is to identify appropriate goals for recovery and establish a plan to achieve them.  Participation Level: Did Not Attend  Participation Quality:  Not Applicable  Affect:  Not Applicable  Cognitive:  Not Applicable  Insight:  Not Applicable  Engagement in Group: Not Applicable  Additional Comments:  Pt did not attend group and was observed lying in bed resting.   Sharyn Lull 01/16/2012, 1:21 PM

## 2012-01-16 NOTE — Progress Notes (Signed)
Progress Note:   Subjective:  Patient reported having chest pain today.  On further questioning the patient states she has had chest pain, substernal that she notes feels "heavy" that she rates as a 6-7.  It is non-radiating in nature, and exacerbated by moving.  She reports that she has had this before and has had a cardiac work up by Dr. Tenny Craw at [place].  Allison Sharp states she had the chest pain last night when she was playing the piano, and it stopped when she started moving furniture.  She denies any nausea, vomiting or diaphoresis associated with this pain.  EKG was done and was largely unremarkable.  Her cardiology office was contacted and the cardiologist on call Dr. Shirlee Latch reviewed the EKG and felt that no other action was needed at this time.   height is 5\' 8"  (1.727 m) and weight is 175.996 kg (388 lb). Her oral temperature is 97.9 F (36.6 C). Her blood pressure is 108/67 and her pulse is 73. Her respiration is 16.  Assessment: Chest pain of non cardiac etiology.  Plan: Continue current plan of care. Rona Ravens. Jshaun Abernathy [facility] 01/16/2012

## 2012-01-16 NOTE — Progress Notes (Signed)
Pt currently resting in bed. Pt has no complaints.Pt breath sounds are even and unlabored. Pt safety is maintain by q 15 minute checks. 

## 2012-01-16 NOTE — Progress Notes (Signed)
[facility] Group Notes:  (Counselor/Nursing/MHT/Case Management/Adjunct) 01/16/2012  1:15pm-2:30pm Feelings about Diagnosis and Recovery   Type of Therapy:  Group Therapy  Participation Level:  Did Not Attend     [name], LCSW 01/16/2012  3:17 PM

## 2012-01-16 NOTE — Progress Notes (Signed)
Allison Sharp did not attend group and has not been out of the bed except to see the doctor this morning.   [employer]

## 2012-01-17 LAB — COMPREHENSIVE METABOLIC PANEL
AST: 17 U/L (ref 0–37)
Albumin: 3.3 g/dL — ABNORMAL LOW (ref 3.5–5.2)
Alkaline Phosphatase: 74 U/L (ref 39–117)
BUN: 9 mg/dL (ref 6–23)
Chloride: 101 mEq/L (ref 96–112)
Potassium: 4 mEq/L (ref 3.5–5.1)
Total Bilirubin: 0.4 mg/dL (ref 0.3–1.2)
Total Protein: 6.5 g/dL (ref 6.0–8.3)

## 2012-01-17 LAB — VALPROIC ACID LEVEL: Valproic Acid Lvl: 54.9 ug/mL (ref 50.0–100.0)

## 2012-01-17 MED ORDER — CHLORPROMAZINE HCL 10 MG PO TABS
10.0000 mg | ORAL_TABLET | Freq: Every day | ORAL | Status: DC
  Administered 2012-01-17 – 2012-01-18 (×2): 10 mg via ORAL
  Filled 2012-01-17: qty 1
  Filled 2012-01-17 (×3): qty 14
  Filled 2012-01-17 (×2): qty 1
  Filled 2012-01-17: qty 14

## 2012-01-17 MED ORDER — BUPRENORPHINE 10 MCG/HR TD PTWK
10.0000 ug | MEDICATED_PATCH | TRANSDERMAL | Status: DC
  Administered 2012-01-17: 10 ug via TRANSDERMAL
  Filled 2012-01-17 (×2): qty 1

## 2012-01-17 NOTE — Progress Notes (Signed)
D: Pt in bed resting with eyes closed. Respirations even and unlabored with Cpap in use. Pt appears to be in no signs of distress at this time. A: Q59min checks remains for this pt. R: Pt remains safe at this time.

## 2012-01-17 NOTE — Progress Notes (Signed)
[facility] Group Notes:  (Counselor/Nursing/MHT/Case Management/Adjunct)  01/17/2012 10:20 PM  Type of Therapy:  Wrap up group  Participation Level:  Active  Participation Quality:  Appropriate  Affect:  Appropriate  Cognitive:  Appropriate  Insight:  Good  Engagement in Group:  Good  Engagement in Therapy:  Good  Modes of Intervention:  Wrap up group  Summary of Progress/Problems:   Allison Sharp 01/17/2012, 10:20 PM

## 2012-01-17 NOTE — Progress Notes (Signed)
[facility] Group Notes: (Counselor/Nursing/MHT/Case Management/Adjunct) 01/17/2012   1:15-2:30pm Emotion Regulation  Type of Therapy:  Group Therapy  Participation Level: Allison Sharp did not attend group  [name], LCSW 01/17/2012  2:31 PM

## 2012-01-17 NOTE — Progress Notes (Signed)
D: Patient denies SI/HI. The patient has a depressed mood and affect. The patient rates her hopelessness and depression both an 8 out of 10 (1 low/10 high). The patient reports sleeping well but states that her appetite and energy level are poor. The patient is having minimal interaction in the milieu and states that she is feeling "too tired today." The patient is interacting poorly on the unit and isolates to her room at time.  A: Patient given emotional support from RN. Patient encouraged to come to staff with concerns and/or questions. Patient's medication routine continued. Patient's orders and plan of care reviewed. The patient was encouraged to attend groups and to participate on the unit.  R: Patient remains appropriate and cooperative but still isolates to room. Will continue to monitor patient q15 minutes for safety and will continue to encourage patient to attend groups.

## 2012-01-17 NOTE — Progress Notes (Signed)
Psychoeducational Group Note  Date:  01/17/2012 Time:  1100  Group Topic/Focus:  Personal Choices and Values:   The focus of this group is to help patients assess and explore the importance of values in their lives, how their values affect their decisions, how they express their values and what opposes their expression.  Participation Level: Did Not Attend  Participation Quality:  Not Applicable  Affect:  Not Applicable  Cognitive:  Not Applicable  Insight:  Not Applicable  Engagement in Group: Not Applicable  Additional Comments:  Pt remained in bed, pt did not attend group.  Karleen Hampshire Brittini 01/17/2012, 1:11 PM

## 2012-01-17 NOTE — Progress Notes (Signed)
[facility] MD Progress Note  01/17/2012 3:21 PM  Current Mental Status Per Physician:  Diagnosis:  Axis I: Schizoaffective Disorder - Depressed Type.  Posttraumatic Stress Disorder.   The patient was seen today and reports the following:   ADL's: Intact.  Sleep: The patient reports that she slept reasonably well last night. Appetite: The patient reports her appetite is again decreased today.   Mild>(1-10) >Severe  Hopelessness (1-10): 8  Depression (1-10): 8  Anxiety (1-10): 8   Suicidal Ideation: The patient denies any current suicidal ideations today.  Plan: No  Intent: No  Means: No   Homicidal Ideation: The patient denies any homicidal ideations today.  Plan: No  Intent: No.  Means: No   General Appearance/Behavior: The patient remained cooperative today with this provider but remained significantly depressed in appearance.  Eye Contact: Fair to Good.  Speech: Appropriate in rate and volume with no pressuring noted today.  Motor Behavior: wnl.  Level of Consciousness: Sedated but Oriented x 3.  Mental Status: Sedated but Oriented x 3.  Mood: Severely depressed.  Affect: Essentially flat.  Anxiety Level: Severe anxiety reported.  Thought Process: wnl.  Thought Content: The patient denies any current auditory or visual hallucinations today. The patient also denies any delusional thinking today.  Perception: wnl.  Judgment: Fair.  Insight: Fair.  Cognition: Oriented to person, place and time.  Sleep:  Number of Hours: 6.5    Vital Signs:Blood pressure 87/60, pulse 101, temperature 97.7 F (36.5 C), temperature source Oral, resp. rate 20, height 5\' 8"  (1.727 m), weight 175.996 kg (388 lb).  Current Medications: Current Facility-Administered Medications  Medication Dose Route Frequency Provider Last Rate Last Dose  . acetaminophen (TYLENOL) tablet 650 mg  650 mg Oral Q6H PRN Curlene Labrum Faizah Kandler, MD   650 mg at 01/11/12 1709  . albuterol (PROVENTIL HFA;VENTOLIN HFA) 108 (90  BASE) MCG/ACT inhaler 2 puff  2 puff Inhalation Q6H PRN Curlene Labrum Melaya Hoselton, MD      . alum & mag hydroxide-simeth (MAALOX/MYLANTA) 200-200-20 MG/5ML suspension 30 mL  30 mL Oral Q4H PRN Curlene Labrum Ardice Boyan, MD      . aspirin chewable tablet 81 mg  81 mg Oral Daily Curlene Labrum Shirlyn Savin, MD   81 mg at 01/17/12 0841  . atorvastatin (LIPITOR) tablet 40 mg  40 mg Oral QHS Curlene Labrum Kirti Carl, MD   40 mg at 01/16/12 2126  . buprenorphine (BUTRANS - dosed mcg/hr) patch 10 mcg  10 mcg Transdermal Weekly Curlene Labrum Meaghan Whistler, MD      . buPROPion [facility] SR) 12 hr tablet 150 mg  150 mg Oral Daily Curlene Labrum Ayomide Purdy, MD   150 mg at 01/17/12 0842  . chlorproMAZINE (THORAZINE) tablet 10 mg  10 mg Oral QHS Curlene Labrum Jatin Naumann, MD      . cholecalciferol (VITAMIN D) tablet 2,000 Units  2,000 Units Oral Q breakfast Ronny Bacon, MD   2,000 Units at 01/17/12 [phone]  . ciprofloxacin (CIPRO) tablet 500 mg  500 mg Oral BH-qamhs Curlene Labrum Kalea Perine, MD   500 mg at 01/17/12 0841  . divalproex (DEPAKOTE ER) 24 hr tablet 1,000 mg  1,000 mg Oral QHS Curlene Labrum Daniella Dewberry, MD   1,000 mg at 01/16/12 2126  . donepezil (ARICEPT) tablet 10 mg  10 mg Oral QHS Curlene Labrum Kesley Gaffey, MD   10 mg at 01/16/12 2126  . DULoxetine (CYMBALTA) DR capsule 60 mg  60 mg Oral BID Ronny Bacon, MD   60 mg at  01/17/12 8469  . feeding supplement (ENSURE COMPLETE) liquid 237 mL  237 mL Oral Q2000 Lavena Bullion, RD   237 mL at 01/16/12 2123  . furosemide (LASIX) tablet 30 mg  30 mg Oral Daily Curlene Labrum Nomi Rudnicki, MD   30 mg at 01/16/12 0843  . GRX ANALGESIC BALM OINT 1 application  1 application Apply externally [id number] Ronny Bacon, MD   1 application at 01/16/12 0800  . levothyroxine (SYNTHROID, LEVOTHROID) tablet 125 mcg  125 mcg Oral QAC breakfast Ronny Bacon, MD   125 mcg at 01/17/12 6295  . magnesium hydroxide (MILK OF MAGNESIA) suspension 30 mL  30 mL Oral Daily PRN Curlene Labrum Vanisha Whiten, MD      . multivitamin with minerals tablet 1 tablet  1 tablet Oral  Daily Ronny Bacon, MD   1 tablet at 01/17/12 0841  . niacin (NIASPAN) CR tablet 500 mg  500 mg Oral QPM Curlene Labrum Christmas Faraci, MD   500 mg at 01/16/12 1703  . nystatin (MYCOSTATIN/NYSTOP) topical powder 1 g  1 g Topical TID PRN Curlene Labrum Bethann Qualley, MD      . pantoprazole (PROTONIX) EC tablet 40 mg  40 mg Oral BH-qamhs Curlene Labrum Vernis Eid, MD   40 mg at 01/17/12 0842  . polyethylene glycol (MIRALAX / GLYCOLAX) packet 17 g  17 g Oral [id number] Curlene Labrum  Shellhammer, MD   17 g at 01/16/12 0845  . verapamil (CALAN-SR) CR tablet 180 mg  180 mg Oral Daily Curlene Labrum Josimar Corning, MD   180 mg at 01/16/12 0845  . DISCONTD: chlorproMAZINE (THORAZINE) tablet 25 mg  25 mg Oral QHS Curlene Labrum Marlo Arriola, MD   25 mg at 01/16/12 2126   Lab Results: No results found for this or any previous visit (from the past 48 hour(s)).  Physical Findings: AIMS: Facial and Oral Movements Muscles of Facial Expression: None, normal Lips and Perioral Area: None, normal Jaw: None, normal Tongue: None, normal,Extremity Movements Upper (arms, wrists, hands, fingers): None, normal Lower (legs, knees, ankles, toes): None, normal, Trunk Movements Neck, shoulders, hips: None, normal, Overall Severity Severity of abnormal movements (highest score from questions above): None, normal Incapacitation due to abnormal movements: None, normal Patient's awareness of abnormal movements (rate only patient's report): No Awareness, Dental Status Current problems with teeth and/or dentures?: No Does patient usually wear dentures?: No   Review of Systems:  Neurological: The patient denies any headaches today. She denies any seizures or dizziness.  G.I.: The patient denies any constipation or G.I. Upset today.  Musculoskeletal: The patient reports some ongoing chronic musculoskeletal pain but in fair to poor control today.   The patient was seen today by this provider and reports to sleeping well but again reports to feeling oversedated during the day. The  patient reports a decreased appetite and reports severe feelings of sadness, anhedonia and depressed mood. The patient denies any suicidal or homicidal ideations today and continues to report severe anxiety.  She denies any auditory or visual hallucinations or delusional thinking today.    The patient asked that the medication Butrans patch be restarted today to address her pain and this will be ordered.  Treatment Plan Summary:  1. Daily contact with patient to assess and evaluate symptoms and progress in treatment.  2. Medication management  3. The patient will deny suicidal ideations or homicidal ideations for 48 hours prior to discharge and have a depression and anxiety rating of 3 or less. The patient will also deny any  auditory or visual hallucinations or delusional thinking.  4. The patient will deny any symptoms of substance withdrawal at time of discharge.   Plan:   1.  The patient was continued on the medication Wellbutrin SR at 150 mgs po q am for depression.  2.  The patient was continued on the medication Depakote ER at 1000 mgs po qhs for mood stabilization.  3.  The patient was continued on the medication Cymbalta at 60 mgs po BID for depression and pain.  4.  The patient was continued on the medication Aricept at 10 mgs po qhs for "memory" issues.  5.  The patient was continued on the medication Thorazine at the reduced dosage of 10 mgs po qhs for sleep.  This was decreased due to oversedation today.  6.  The patient was restarted on the medication Butrans at 10 mcg patch every 7 days for pain.   7.  Will continue the patient on her current non-psychiatric medications as listed above.   8.  Will continue C-PAP with O2 to be used at anytime the patient is laying down.   9.  Laboratory Studies reviewed.   10. Will order a repeat CMP, Serum Depakote Level and UA for this evening.  11. Will continue to monitor.   Talyn Eddie 01/17/2012, 3:21 PM

## 2012-01-17 NOTE — Progress Notes (Signed)
[facility] Group Notes:  (Counselor/Nursing/MHT/Case Management/Adjunct)  01/17/2012 3:36 PM  Type of Therapy:  Psychoeducational Skills  Participation Level:  Did Not Attend  Summary of Progress/Problems: Allison Sharp did not attend Psycho education group on labels.    Wandra Scot 01/17/2012, 3:36 PM

## 2012-01-18 ENCOUNTER — Ambulatory Visit (HOSPITAL_COMMUNITY): Payer: Self-pay | Admitting: Psychiatry

## 2012-01-18 LAB — URINALYSIS, ROUTINE W REFLEX MICROSCOPIC
Glucose, UA: NEGATIVE mg/dL
Ketones, ur: NEGATIVE mg/dL
Nitrite: NEGATIVE
Specific Gravity, Urine: 1.027 (ref 1.005–1.030)
pH: 6.5 (ref 5.0–8.0)

## 2012-01-18 LAB — URINE MICROSCOPIC-ADD ON

## 2012-01-18 NOTE — Progress Notes (Signed)
Psychoeducational Group Note  Date:  01/18/2012 Time:  1100  Group Topic/Focus:  Overcoming Stress:   The focus of this group is to define stress and help patients assess their triggers.  Participation Level:  Active  Participation Quality:  Appropriate, Attentive and Sharing  Affect:  Appropriate  Cognitive:  Alert  Insight:  Good  Engagement in Group:  Good  Additional Comments:  Pt was very appropriate and attentive while attending group. Pt shared that confrontation causes her to become stressed out.   Sharyn Lull 01/18/2012, 11:56 AM

## 2012-01-18 NOTE — Progress Notes (Signed)
Patient attended Karaoke group this evening and was attentive and supportive.  Pt left group at 9 pm, reporting it became too loud for her.

## 2012-01-18 NOTE — Progress Notes (Signed)
[facility] MD Progress Note  01/18/2012 4:52 PM  Current Mental Status Per Physician:  Diagnosis:  Axis I: Schizoaffective Disorder - Depressed Type.  Posttraumatic Stress Disorder.   The patient was seen today and reports the following:   ADL's: Intact.  Sleep: The patient reports that she slept very well last night.  Appetite: The patient reports her appetite is good today.   Mild>(1-10) >Severe  Hopelessness (1-10): 4  Depression (1-10): 6  Anxiety (1-10): 4   Suicidal Ideation: The patient denies any current suicidal ideations today.  Plan: No  Intent: No  Means: No   Homicidal Ideation: The patient denies any homicidal ideations today.  Plan: No  Intent: No.  Means: No   General Appearance/Behavior: The patient remained cooperative today with this provider but was much brighter in appearance.  Eye Contact: Fair to Good.  Speech: Appropriate in rate and volume with no pressuring noted today.  Motor Behavior: wnl.  Level of Consciousness: Alert and Oriented x 3.  Mental Status: Alert and Oriented x 3.  Mood: Moderately depressed.  Affect: Moderately constricted.  Anxiety Level: Mild to moderate anxiety reported.  Thought Process: wnl.  Thought Content: The patient denies any current auditory or visual hallucinations today. The patient also denies any delusional thinking today.  Perception: wnl.  Judgment: Fair to Good.  Insight: Fair to Good.  Cognition: Oriented to person, place and time.  Sleep:  Number of Hours: 6.25    Vital Signs:Blood pressure 118/84, pulse 101, temperature 98 F (36.7 C), temperature source Oral, resp. rate 20, height 5\' 8"  (1.727 m), weight 175.996 kg (388 lb).  Current Medications: Current Facility-Administered Medications  Medication Dose Route Frequency Provider Last Rate Last Dose  . acetaminophen (TYLENOL) tablet 650 mg  650 mg Oral Q6H PRN Curlene Labrum Vaughn Beaumier, MD   650 mg at 01/11/12 1709  . albuterol (PROVENTIL HFA;VENTOLIN HFA) 108 (90  BASE) MCG/ACT inhaler 2 puff  2 puff Inhalation Q6H PRN Curlene Labrum Tally Mattox, MD      . alum & mag hydroxide-simeth (MAALOX/MYLANTA) 200-200-20 MG/5ML suspension 30 mL  30 mL Oral Q4H PRN Curlene Labrum Teyah Rossy, MD      . aspirin chewable tablet 81 mg  81 mg Oral Daily Curlene Labrum Iley Breeden, MD   81 mg at 01/18/12 0757  . atorvastatin (LIPITOR) tablet 40 mg  40 mg Oral QHS Curlene Labrum Nicie Milan, MD   40 mg at 01/17/12 2211  . buprenorphine (BUTRANS - dosed mcg/hr) patch 10 mcg  10 mcg Transdermal Weekly Curlene Labrum Kameelah Minish, MD   10 mcg at 01/17/12 1638  . buPROPion (WELLBUTRIN SR) 12 hr tablet 150 mg  150 mg Oral Daily Curlene Labrum Ned Kakar, MD   150 mg at 01/18/12 0757  . chlorproMAZINE (THORAZINE) tablet 10 mg  10 mg Oral QHS Curlene Labrum Zamariah Seaborn, MD   10 mg at 01/17/12 2211  . cholecalciferol (VITAMIN D) tablet 2,000 Units  2,000 Units Oral Q breakfast Ronny Bacon, MD   2,000 Units at 01/18/12 0756  . divalproex (DEPAKOTE ER) 24 hr tablet 1,000 mg  1,000 mg Oral QHS Curlene Labrum Alexius Ellington, MD   1,000 mg at 01/17/12 2210  . donepezil (ARICEPT) tablet 10 mg  10 mg Oral QHS Curlene Labrum Riyad Keena, MD   10 mg at 01/17/12 2210  . DULoxetine (CYMBALTA) DR capsule 60 mg  60 mg Oral BID Curlene Labrum Rawleigh Rode, MD   60 mg at 01/18/12 1645  . feeding supplement (ENSURE COMPLETE) liquid 237 mL  237 mL Oral Q2000 Lavena Bullion, RD   237 mL at 01/17/12 2210  . furosemide (LASIX) tablet 30 mg  30 mg Oral Daily Curlene Labrum Ikechukwu Cerny, MD   30 mg at 01/18/12 0757  . GRX ANALGESIC BALM OINT 1 application  1 application Apply externally [id number] Ronny Bacon, MD   1 application at 01/16/12 0800  . levothyroxine (SYNTHROID, LEVOTHROID) tablet 125 mcg  125 mcg Oral QAC breakfast Ronny Bacon, MD   125 mcg at 01/18/12 [phone]  . magnesium hydroxide (MILK OF MAGNESIA) suspension 30 mL  30 mL Oral Daily PRN Curlene Labrum Matti Minney, MD      . multivitamin with minerals tablet 1 tablet  1 tablet Oral Daily Ronny Bacon, MD   1 tablet at 01/18/12 0757  . niacin  (NIASPAN) CR tablet 500 mg  500 mg Oral QPM Curlene Labrum Geoge Lawrance, MD   500 mg at 01/18/12 1645  . nystatin (MYCOSTATIN/NYSTOP) topical powder 1 g  1 g Topical TID PRN Curlene Labrum Chauntel Windsor, MD      . pantoprazole (PROTONIX) EC tablet 40 mg  40 mg Oral BH-qamhs Curlene Labrum Bohdi Leeds, MD   40 mg at 01/18/12 0757  . polyethylene glycol (MIRALAX / GLYCOLAX) packet 17 g  17 g Oral [id number] Curlene Labrum Erron Wengert, MD   17 g at 01/16/12 0845  . verapamil (CALAN-SR) CR tablet 180 mg  180 mg Oral Daily Ronny Bacon, MD   180 mg at 01/18/12 0756   Lab Results:  Results for orders placed during the hospital encounter of 01/09/12 (from the past 48 hour(s))  COMPREHENSIVE METABOLIC PANEL     Status: Abnormal   Collection Time   01/17/12  7:37 PM      Component Value Range Comment   Sodium 138  135 - 145 mEq/L    Potassium 4.0  3.5 - 5.1 mEq/L    Chloride 101  96 - 112 mEq/L    CO2 30  19 - 32 mEq/L    Glucose, Bld 115 (*) 70 - 99 mg/dL    BUN 9  6 - 23 mg/dL    Creatinine, Ser 9.60  0.50 - 1.10 mg/dL    Calcium 9.6  8.4 - 45.4 mg/dL    Total Protein 6.5  6.0 - 8.3 g/dL    Albumin 3.3 (*) 3.5 - 5.2 g/dL    AST 17  0 - 37 U/L    ALT 10  0 - 35 U/L    Alkaline Phosphatase 74  39 - 117 U/L    Total Bilirubin 0.4  0.3 - 1.2 mg/dL    GFR calc non Af Amer 75 (*) >90 mL/min    GFR calc Af Amer 86 (*) >90 mL/min   VALPROIC ACID LEVEL     Status: Normal   Collection Time   01/17/12  7:37 PM      Component Value Range Comment   Valproic Acid Lvl 54.9  50.0 - 100.0 ug/mL   URINALYSIS, ROUTINE W REFLEX MICROSCOPIC     Status: Abnormal   Collection Time   01/17/12  9:25 PM      Component Value Range Comment   Color, Urine AMBER (*) YELLOW BIOCHEMICALS MAY BE AFFECTED BY COLOR   APPearance CLOUDY (*) CLEAR    Specific Gravity, Urine 1.027  1.005 - 1.030    pH 6.5  5.0 - 8.0    Glucose, UA NEGATIVE  NEGATIVE mg/dL    Hgb urine dipstick  SMALL (*) NEGATIVE    Bilirubin Urine SMALL (*) NEGATIVE    Ketones, ur NEGATIVE   NEGATIVE mg/dL    Protein, ur NEGATIVE  NEGATIVE mg/dL    Urobilinogen, UA 1.0  0.0 - 1.0 mg/dL    Nitrite NEGATIVE  NEGATIVE    Leukocytes, UA LARGE (*) NEGATIVE   URINE MICROSCOPIC-ADD ON     Status: Abnormal   Collection Time   01/17/12  9:25 PM      Component Value Range Comment   Squamous Epithelial / LPF FEW (*) RARE    WBC, UA 11-20  <3 WBC/hpf    RBC / HPF 3-6  <3 RBC/hpf    Bacteria, UA MANY (*) RARE    Crystals CA OXALATE CRYSTALS (*) NEGATIVE    Urine-Other MUCOUS PRESENT   AMORPHOUS URATES/PHOSPHATES   Physical Findings: AIMS: Facial and Oral Movements Muscles of Facial Expression: None, normal Lips and Perioral Area: None, normal Jaw: None, normal Tongue: None, normal,Extremity Movements Upper (arms, wrists, hands, fingers): None, normal Lower (legs, knees, ankles, toes): None, normal, Trunk Movements Neck, shoulders, hips: None, normal, Overall Severity Severity of abnormal movements (highest score from questions above): None, normal Incapacitation due to abnormal movements: None, normal Patient's awareness of abnormal movements (rate only patient's report): No Awareness, Dental Status Current problems with teeth and/or dentures?: No Does patient usually wear dentures?: No   Review of Systems:  Neurological: The patient denies any headaches today. She denies any seizures or dizziness.  G.I.: The patient denies any constipation or G.I. Upset today.  Musculoskeletal: The patient reports some ongoing chronic musculoskeletal pain but states this is under good control today.   The patient was seen today by this provider and reports to sleeping very well.  She reports a much improved appetite as well as improved symptoms of depression.  The patient reports moderate feels of sadness, anhedonia and depressed mood.  She denies any suicidal or homicidal ideations today as well as any auditory or visual hallucinations or delusional thinking.  She reports mild to moderate anxiety  symptoms today and denies any medication related side effects today.  It was discussed with the patient a possible discharge tomorrow if her symptoms continue to improve and she agreed.  Treatment Plan Summary:  1. Daily contact with patient to assess and evaluate symptoms and progress in treatment.  2. Medication management  3. The patient will deny suicidal ideations or homicidal ideations for 48 hours prior to discharge and have a depression and anxiety rating of 3 or less. The patient will also deny any auditory or visual hallucinations or delusional thinking.  4. The patient will deny any symptoms of substance withdrawal at time of discharge.   Plan:  1. The patient was continued on the medication Wellbutrin SR at 150 mgs po q am for depression.  2. The patient was continued on the medication Depakote ER at 1000 mgs po qhs for mood stabilization.  3. The patient was continued on the medication Cymbalta at 60 mgs po BID for depression and pain.  4. The patient was continued on the medication Aricept at 10 mgs po qhs for "memory" issues.  5. The patient was continued on the medication Thorazine at 10 mgs po qhs for sleep.  6. The patient was continue on the medication Butrans at 10 mcg patch every 7 days for pain.  7. Will continue the patient on her current non-psychiatric medications as listed above.  8. Will continue C-PAP with O2 to be  used at anytime the patient is laying down.  9. Laboratory Studies reviewed.  10. Will continue to monitor.   Allison Sharp 01/18/2012, 4:52 PM

## 2012-01-18 NOTE — Progress Notes (Signed)
D: Patient denies SI/HI and auditory and visual hallucinations. Patient has depressed mood and a flat affect. The patient rates her depression a 6 out of 10 and her hopelessness a 6 out of 10 (1 low/10 high). The patient reports sleeping well and states that her appetite and energy level are good. The patient also states that she "feels good today, for the first time in a long time."  A: Patient given emotional support from RN. Patient encouraged to come to staff with concerns and/or questions. Patient's medication routine continued. Patient's orders and plan of care reviewed.  R: Patient remains appropriate and cooperative. Will continue to monitor patient q15 minutes for safety.

## 2012-01-18 NOTE — Progress Notes (Signed)
D: Pt seems much brighter this PM. Pt. More talkative--stated that she is doing great,& has been up all day.Denies SI,HI, & AVH.Pt is currently in [place].A: Supported & encouraged;on 15 minute checks. R: Pt safety maintained.

## 2012-01-18 NOTE — Progress Notes (Signed)
Psychoeducational Group Note  Date:  01/18/2012 Time:  1000  Group Topic/Focus:  Therapeutic Activity-Question Ball  Participation Level:  Active  Participation Quality:  Appropriate, Attentive, Sharing and Supportive  Affect:  Depressed  Cognitive:  Alert and Oriented  Insight:  Good  Engagement in Group:  Good  Additional Comments:  Patient shared with group coping skills she has learned during her admission.  Allison Sharp 01/18/2012, 10:56 AM 

## 2012-01-19 MED ORDER — VERAPAMIL HCL ER 180 MG PO TBCR: 180.0000 mg | EXTENDED_RELEASE_TABLET | Freq: Every day | ORAL | Status: DC

## 2012-01-19 MED ORDER — POLYETHYLENE GLYCOL 3350 17 G PO PACK: 17.0000 g | PACK | ORAL | Status: AC

## 2012-01-19 MED ORDER — ATORVASTATIN CALCIUM 40 MG PO TABS: 40.0000 mg | ORAL_TABLET | Freq: Every day | ORAL | Status: DC

## 2012-01-19 MED ORDER — GRX ANALGESIC BALM EX OINT: 1.0000 "application " | TOPICAL_OINTMENT | CUTANEOUS | Status: DC

## 2012-01-19 MED ORDER — DIVALPROEX SODIUM ER 500 MG PO TB24: 1000.0000 mg | ORAL_TABLET | Freq: Every day | ORAL | Status: DC

## 2012-01-19 MED ORDER — ASPIRIN 81 MG PO CHEW: 81.0000 mg | CHEWABLE_TABLET | Freq: Every day | ORAL | Status: DC

## 2012-01-19 MED ORDER — BUPROPION HCL ER (SR) 150 MG PO TB12: 150.0000 mg | ORAL_TABLET | Freq: Every day | ORAL | Status: DC

## 2012-01-19 MED ORDER — DONEPEZIL HCL 10 MG PO TABS: 10.0000 mg | ORAL_TABLET | Freq: Every day | ORAL | Status: DC

## 2012-01-19 MED ORDER — FUROSEMIDE 20 MG PO TABS: 30.0000 mg | ORAL_TABLET | Freq: Every day | ORAL | Status: DC

## 2012-01-19 MED ORDER — CHOLECALCIFEROL 50 MCG (2000 UT) PO TABS: 2000.0000 [IU] | ORAL_TABLET | Freq: Every day | ORAL | Status: DC

## 2012-01-19 MED ORDER — ADULT MULTIVITAMIN W/MINERALS CH: 1.0000 | ORAL_TABLET | Freq: Every day | ORAL | Status: DC

## 2012-01-19 MED ORDER — NYSTATIN 100000 UNIT/GM EX POWD: 1.0000 g | Freq: Three times a day (TID) | CUTANEOUS | Status: DC | PRN

## 2012-01-19 MED ORDER — CHLORPROMAZINE HCL 10 MG PO TABS: 10.0000 mg | ORAL_TABLET | Freq: Every day | ORAL | Status: DC

## 2012-01-19 MED ORDER — ALBUTEROL SULFATE HFA 108 (90 BASE) MCG/ACT IN AERS: 2.0000 | INHALATION_SPRAY | Freq: Four times a day (QID) | RESPIRATORY_TRACT | Status: DC | PRN

## 2012-01-19 MED ORDER — LEVOTHYROXINE SODIUM 125 MCG PO TABS: 125.0000 ug | ORAL_TABLET | Freq: Every day | ORAL | Status: DC

## 2012-01-19 MED ORDER — ENSURE COMPLETE PO LIQD: 237.0000 mL | Freq: Every day | ORAL | Status: DC

## 2012-01-19 MED ORDER — PANTOPRAZOLE SODIUM 40 MG PO TBEC: 40.0000 mg | DELAYED_RELEASE_TABLET | ORAL | Status: DC

## 2012-01-19 MED ORDER — NIACIN ER (ANTIHYPERLIPIDEMIC) 500 MG PO TBCR: 500.0000 mg | EXTENDED_RELEASE_TABLET | Freq: Every evening | ORAL | Status: DC

## 2012-01-19 MED ORDER — BUPRENORPHINE 10 MCG/HR TD PTWK: 10.0000 ug | MEDICATED_PATCH | TRANSDERMAL | Status: DC

## 2012-01-19 MED ORDER — DULOXETINE HCL 60 MG PO CPEP: 60.0000 mg | ORAL_CAPSULE | Freq: Two times a day (BID) | ORAL | Status: DC

## 2012-01-19 NOTE — Progress Notes (Signed)
[facility] Case Management Discharge Plan:  Will you be returning to the same living situation after discharge: Yes,  Patient returning to her home At discharge, do you have transportation home?:Yes,  sister will transport patient home Do you have the ability to pay for your medications:Yes,  Patient has ability to obtain medications  Interagency Information:     Release of information consent forms completed and in the chart;  Patient's signature needed at discharge.  Patient to Follow up at:  Follow-up Information    Follow up with Dr. Lolly Mustache - [facility] on 01/25/2012. (appt. at    with Dr. Lolly Mustache at  9:45)    Contact information:   [facility]  [place], [place] [phone]      Follow up with Florencia Reasons - [facility] on 01/22/2012. (You are schedule with Florencia Reasons on Monday, January 22, 2012 at 1:00PM )    Contact information:   [address] [place], [place]  [address]  [phone]         Patient denies SI/HI:   Yes,  Patient is no longer endorsign SI or other thoughts of self harm    Safety Planning and Suicide Prevention discussed:  Yes,  Reviewed during aftercare gorup  Barrier to discharge identified: No barriers identified  Summary and Recommendations: Patient encourage to be compliant with medications and follow up with outpatient recommendations    Livio Ledwith, Joesph July 01/19/2012, 11:21 AM

## 2012-01-19 NOTE — Progress Notes (Signed)
D: Pt in bed resting with eyes closed. Respirations even and unlabored with Cpap machine in use at 3l/min of O2 added. At this time pt appears to be in no signs of distress A: Q44min checks remains for this pt. R: Pt remains safe at this time.

## 2012-01-19 NOTE — Progress Notes (Signed)
[facility] Family/Significant Other Suicide Prevention Education  Suicide Prevention Education:  Education Completed; Allison Sharp (neice) has been identified by the patient as the family member/significant other with whom the patient will be residing, and identified as the person(s) who will aid the patient in the event of a mental health crisis (suicidal ideations/suicide attempt).  With written consent from the patient, the family member/significant other has been provided the following suicide prevention education, prior to the and/or following the discharge of the patient.  The suicide prevention education provided includes the following:  Suicide risk factors  Suicide prevention and interventions  National Suicide Hotline telephone number  [facility] assessment telephone number  [facility] Emergency Assistance 911  [facility] and/or Residential Mobile Crisis Unit telephone number  Request made of family/significant other to:  Remove weapons (e.g., guns, rifles, knives), all items previously/currently identified as safety concern.    Remove drugs/medications (over-the-counter, prescriptions, illicit drugs), all items previously/currently identified as a safety concern.  The family member/significant other verbalizes understanding of the suicide prevention education information provided.  The family member/significant other agrees to remove the items of safety concern listed above.  Met with patient's niece in the lobby. Told her that counselor had attempted to call her mother, Allison Sharp to go over suicide prevention information. Niece was willing to take pamphlet and stated that she was very familiar with the information since patient had been here several times. The plans are for patient to stay with them for awhile.  Allison Sharp 01/19/2012, 12:38 PM

## 2012-01-19 NOTE — Progress Notes (Signed)
Psychoeducational Group Note  Date:  01/19/2012 Time: 1100  Group Topic/Focus:  Recovery Goals:   The focus of this group is to identify appropriate goals for recovery and establish a plan to achieve them.  Participation Level:  Active  Participation Quality:  Appropriate and Attentive  Affect:  Appropriate  Cognitive:  Appropriate  Insight:  Good  Engagement in Group:  Good  Additional Comments:  Pt participated in group. Pt explained defined in own words of relapse and was given term of relapse.Pt also was given information on recognizing when in crisis of relapsing. Pt stated ways, times, behaviors, and attitudes when in stages of relapsing. Pt also was given a handout on defining relapse, and coping skills to prevent relapsing. Pt was appropriate and shared during group.  Karleen Hampshire Brittini 01/19/2012, 3:08 PM

## 2012-01-19 NOTE — Progress Notes (Signed)
[facility] Group Notes:  (Counselor/Nursing/MHT/Case Management/Adjunct)  01/19/2012  2:30  PM  Type of Therapy: Group Therapy   Participation Level: Did not attend       Allison Sharp C 01/19/2012  2:30 PM  
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
## 2012-01-19 NOTE — Progress Notes (Addendum)
Nrsg DC note  MD completed suicide risk assessment as well as DC order in chart. Pt denied SI, HI, and / or presence of audit, vis, tactile halluc. Nrsg DC SRA completed. Pt was given AVS, states she understands and can / will comply.Belongings returned to her and she was escorted to bldg entrance per policy and DC'd [facility]

## 2012-01-22 ENCOUNTER — Ambulatory Visit (HOSPITAL_COMMUNITY): Payer: Self-pay | Admitting: Psychiatry

## 2012-01-23 NOTE — Progress Notes (Signed)
Patient Discharge Instructions:  After Visit Summary (AVS):   Access to EMR:  01/23/2012 Psychiatric Admission Assessment Note:   Access to EMR:  01/23/2012 Suicide Risk Assessment - Discharge Assessment:   Access to EMR:  01/23/2012 Next Level Care Provider Has Access to the EMR, 01/23/2012  Records provided to [facility] Dr. Lolly Mustache and Florencia Reasons via [facility]/Epic access.  Wandra Scot, 01/23/2012, 5:04 PM

## 2012-01-25 ENCOUNTER — Encounter (HOSPITAL_COMMUNITY): Payer: Self-pay | Admitting: Psychiatry

## 2012-01-25 ENCOUNTER — Ambulatory Visit (INDEPENDENT_AMBULATORY_CARE_PROVIDER_SITE_OTHER): Payer: Self-pay | Admitting: Psychiatry

## 2012-01-25 VITALS — Wt 380.0 lb

## 2012-01-25 DIAGNOSIS — F319 Bipolar disorder, unspecified: Secondary | ICD-10-CM

## 2012-01-25 NOTE — Progress Notes (Signed)
Chief complaint I was admitted to the hospital due to visual hallucination.  I'm feeling better now.          History of presenting illness Patient is 56 year old Caucasian female who came for her follow up appointment with her sister.  [facility] due to increased visual hallucination.  Patient was taking pain medication which believed to be causing hallucination and patient does not feel comfortable and required inpatient hospitalization.  Patient also endorse increase distressed due to her living situation.  She is not thinking to live with her sister at [facility].  Patient has issue with her sister before however she wants to again.  She does not want to stay with her nephew anymore.  During hospitalization her Thorazine has been reduced.  She is taking only 10 mg at bedtime.  Her Neurontin and other pain medications were also start.  She is taking pain patches which is helping her.  Patient admitted taking decision sometime difficult about her living.  She had applied for assisted living but not sure when she will be able to get place.  Today she came with her sister who is very involved in her treatment plan.   she also want her to be living in assisted living.  Overall patient is doing better now.  She is sleeping better.  She denies any hallucination paranoia or agitation.  She denies any crying spells.  She has no issues with the medication.  She's excited as she lost weight from the past.    Current psychiatric medication Depakote 1000 mg at bedtime, last level  54.9 on August 21. Cmbalta 60 mg twice a day Thorazine 10 mg at bedtime.  Wellbutrin SR 150 mg daily She's also taking Aricept 10 mg a neurologist.  Past psychiatric history Patient has multiple psychiatric admission. She has at least 3 his suicidal attempt in the past. Her last admission was  2 weeks ago.  In the past she had tried Paxil Prozac Wellbutrin Effexor Lexapro amitriptyline  however she always had a good response with Cymbalta but it causes constipation. She has been diagnosed with bipolar disorder  Medical history Patient has history of hyperlipidemia, GERD, chronic back pain, obesity, arthritis, hypertension, abstract and sleep apnea, vitamin D deficiency and history of myocardial infarction. Her primary care physician is  Dr. Ileana Ladd in [facility].  Family history Patient has family member who has been diagnosed with bipolar disorder  Social history Patient currently living with her niece and her husband however they are getting divorced.  Her niece husband is drinking.  In the past she was living with her sister however she was not happy with her and decided to move with her niece.  Earlier she was living on her own but she has difficulty keeping appointment and taking medication on time and her sister convince her to live with her .    Mental status examination Patient is morbid obese female who is casually dressed and fairly groomed.   she is pleasant and cooperative.  Her speech is clear and coherent.  She has no tremors or shakes.  Her thought processes slow but logical.  She described her mood is good and her affect is mood appropriate.  She denies any auditory or visual hallucination.  There were no psychotic symptoms present at this time.  Her attention concentration is fair.  She's alert and oriented x3.  Her insight judgment and impulse control  is fair.  Assessment Axis I bipolar disorder  Axis II deferred  Axis III see medical history Axis IV mild to moderate  Axis V 55-60  Plan I reviewed psychosocial stressors, medication update,  recent discharge summary and blood results.  At this time patient is fairly stable on her current psychiatric medication.  Her Neurontin has been discontinued.  She was started on Wellbutrin and her Thorazine dose has been reduced.  Patient denies any side effects of medication.  Patient is  thinking to move to live with her sister however she liked ago assisted living eventually.  I discussed in detail the risks and benefits of medication and recommended to call us if she is any question or concern or if she feels worsening of the symptoms.  I will see her again in 4 weeks.  Time spent 30 minutes.  Portion of this note is generated with voice dictation software and may contain typographical error.

## 2012-01-30 ENCOUNTER — Ambulatory Visit (INDEPENDENT_AMBULATORY_CARE_PROVIDER_SITE_OTHER): Payer: Self-pay | Admitting: Psychiatry

## 2012-01-30 DIAGNOSIS — F319 Bipolar disorder, unspecified: Secondary | ICD-10-CM

## 2012-01-30 NOTE — Patient Instructions (Signed)
Discussed orally 

## 2012-01-30 NOTE — Progress Notes (Signed)
Patient:  Allison Sharp   DOB: [birth year]  MR Number: [id number]  [facility]  [address] [place],  [place], [address]  Start: Tuesday 01/30/2012 2:00 PM End: Tuesday 01/30/2012 2:50 PM  Provider/Observer:     Florencia Reasons, MSW, LCSW   Chief Complaint:      Chief Complaint  Patient presents with  . Depression  . Anxiety    Reason For Service:     The patient is referred for services by psychiatrist Dr. Lolly Mustache to improve coping skills as patient has a long-standing history of mood disturbances. She is resuming services with this clinician after 2-1/2 month absence. Patient has had 2 psychiatric hospitalizations since last session due to depression and hallucinations. She was discharged about 10 days ago. Patient is seen today for follow up appointment.   Interventions Strategy:  Supportive therapy  Participation Level:   Active  Participation Quality:  Appropriate      Behavioral Observation:  Casual, Alert, and Appropriate.   Current Psychosocial Factors: The patient is planning to move from her nephew's home to her sister's home.  Content of Session:    reviewing symptoms, processing feelings, identifying ways to use support system, exploring relaxation and coping techniques, identifying ways to improve sleep hygiene  Current Status:   The patient reports improved mood but continued sleep difficulty as well as anxiety.  Patient Progress:   Fair. The patient reports she has had 2 psychiatric hospitalizations since her last session. The first one occurred due to to medication issues, hallucinations, and depression. She reports the second psychiatric hospitalization was triggered by increased stress regarding patient residing with her nephew. Per patient's report, her nephew's girlfriend feels uncomfortable with the patient residing with her nephew. She states that her nephew had made comments to patient about his girlfriend's remarks. She reports talking with  her nephew last night about moving out the beginning of October. Patient plans to reside with her sister with whom she has had conflict in the past. However, patient and sister have discussed the past issues, have been able to resolve them,  and have open communication about expectations of their upcoming living arrangement. Patient expresses some sadness and loss issues related to leaving her nephew's home as she and her nephew are very close. This also will reduce the frequency that she normally sees her 58-year-old great nephew. However, patient reports she'll still be able to see her great nephew and has already made a plan for regular visits and contact. Patient is pleased that she was able to be assertive with her nephew and expresses relief now that she has a plan. The patient reports sleep difficulty. However, she has tried to maintain involvement in activities and reports she now is driving again and visiting friends and family.     d Target Goals:   Improve mood, decrease anxiety  Last Reviewed:     Goals Addressed Today:    Improve mood, decrease anxiety  Impression/Diagnosis:   The patient presents with a long-standing history of mood disturbances with 3 suicide attempts and several psychiatric hospitalizations. The most recent hospitalization occurred in August 2013 due to to depression and hallucinations . Patient has a history of bipolar disorder and a significant family history of bipolar disorder. Her symptoms have included guilt, depressed mood, anxiety, mood swings,  and excessive worrying. Diagnosis: Bipolar 1 disorder   Diagnosis:  Axis I:  1. Bipolar 1 disorder  Axis II: Deferred

## 2012-01-31 ENCOUNTER — Ambulatory Visit (INDEPENDENT_AMBULATORY_CARE_PROVIDER_SITE_OTHER): Payer: Medicare Other | Admitting: Gastroenterology

## 2012-01-31 ENCOUNTER — Encounter: Payer: Self-pay | Admitting: Gastroenterology

## 2012-01-31 VITALS — BP 132/90 | HR 74 | Temp 98.2°F | Ht 69.0 in | Wt 378.4 lb

## 2012-01-31 DIAGNOSIS — K219 Gastro-esophageal reflux disease without esophagitis: Secondary | ICD-10-CM

## 2012-01-31 DIAGNOSIS — K5909 Other constipation: Secondary | ICD-10-CM

## 2012-01-31 DIAGNOSIS — R1314 Dysphagia, pharyngoesophageal phase: Secondary | ICD-10-CM

## 2012-01-31 MED ORDER — LUBIPROSTONE 8 MCG PO CAPS: ORAL_CAPSULE | ORAL | Status: AC

## 2012-01-31 MED ORDER — DEXLANSOPRAZOLE 60 MG PO CPDR: DELAYED_RELEASE_CAPSULE | ORAL | Status: DC

## 2012-01-31 NOTE — Patient Instructions (Signed)
THE GOAL IS TO HAVE 2-3 BMs A WEEK & GET YOUR REFLUX BETTER CONTROLLED.   Complete barium swallow.  STOP OMEPRAZOLE. START DEXILANT.  FOLLOW A LOW FAT/HIGH FIBER DIET. SEE INFO BELOW.  DRINK WATER TO KEEP URINE LIGHT YELLOW. AVOID FRUIT JUICE, SODA, AND SWEET TEA.  START AMITIZA 8 MCG AT BEDTIME. USE SAMPLE AND IF NO NAUSEA OR DIARRHEA, PICK UP PRESCRIPTION AND TAKE BID.  CONTINUE MIRALAX BID UNLESS YOU ARE HAVING DIARRHEA. IF YOU HAVE DIARRHEA. USE AS NEEDED.  SEE NUTRITION.  FOLLOW UP IN 4 MOS.  Low-Fat Diet BREADS, CEREALS, PASTA, RICE, DRIED PEAS, AND BEANS These products are high in carbohydrates and most are low in fat. Therefore, they can be increased in the diet as substitutes for fatty foods. They too, however, contain calories and should not be eaten in excess. Cereals can be eaten for snacks as well as for breakfast.  Include foods that contain fiber (fruits, vegetables, whole grains, and legumes). Research shows that fiber may lower blood cholesterol levels, especially the water-soluble fiber found in fruits, vegetables, oat products, and legumes. FRUITS AND VEGETABLES It is good to eat fruits and vegetables. Besides being sources of fiber, both are rich in vitamins and some minerals. They help you get the daily allowances of these nutrients. Fruits and vegetables can be used for snacks and desserts. MEATS Limit lean meat, chicken, [place], and fish to no more than 6 ounces per day. Beef, Pork, and Lamb Use lean cuts of beef, pork, and lamb. Lean cuts include:  Extra-lean ground beef.  Arm roast.  Sirloin tip.  Center-cut ham.  Round steak.  Loin chops.  Rump roast.  Tenderloin.  Trim all fat off the outside of meats before cooking. It is not necessary to severely decrease the intake of red meat, but lean choices should be made. Lean meat is rich in protein and contains a highly absorbable form of iron. Premenopausal women, in particular, should avoid reducing lean red  meat because this could increase the risk for low red blood cells (iron-deficiency anemia).  Chicken and [place] These are good sources of protein. The fat of poultry can be reduced by removing the skin and underlying fat layers before cooking. Chicken and [place] can be substituted for lean red meat in the diet. Poultry should not be fried or covered with high-fat sauces. Fish and Shellfish Fish is a good source of protein. Shellfish contain cholesterol, but they usually are low in saturated fatty acids. The preparation of fish is important. Like chicken and [place], they should not be fried or covered with high-fat sauces. EGGS Egg whites contain no fat or cholesterol. They can be eaten often. Try 1 to 2 egg whites instead of whole eggs in recipes or use egg substitutes that do not contain yolk.  MILK AND DAIRY PRODUCTS Use skim or 1% milk instead of 2% or whole milk. Decrease whole milk, natural, and processed cheeses. Use nonfat or low-fat (2%) cottage cheese or low-fat cheeses made from vegetable oils. Choose nonfat or low-fat (1 to 2%) yogurt. Experiment with evaporated skim milk in recipes that call for heavy cream. Substitute low-fat yogurt or low-fat cottage cheese for sour cream in dips and salad dressings. Have at least 2 servings of low-fat dairy products, such as 2 glasses of skim (or 1%) milk each day to help get your daily calcium intake.  FATS AND OILS Butterfat, lard, and beef fats are high in saturated fat and cholesterol. These should be avoided.Vegetable fats  do not contain cholesterol. AVOID coconut oil, palm oil, and palm kernel oil, WHICH are very high in saturated fats. These should be limited. These fats are often used in bakery goods, processed foods, popcorn, oils, and nondairy creamers. Vegetable shortenings and some peanut butters contain hydrogenated oils, which are also saturated fats. Read the labels on these foods and check for saturated vegetable oils.  Desirable liquid  vegetable oils are corn oil, cottonseed oil, olive oil, canola oil, safflower oil, soybean oil, and sunflower oil. Peanut oil is not as good, but small amounts are acceptable. Buy a heart-healthy tub margarine that has no partially hydrogenated oils in the ingredients. AVOID Mayonnaise and salad dressings often are made from unsaturated fats.  OTHER EATING TIPS Snacks  Most sweets should be limited as snacks. They tend to be rich in calories and fats, and their caloric content outweighs their nutritional value. Some good choices in snacks are graham crackers, melba toast, soda crackers, bagels (no egg), English muffins, fruits, and vegetables. These snacks are preferable to snack crackers, [place] fries, and chips. Popcorn should be air-popped or cooked in small amounts of liquid vegetable oil.  Desserts Eat fruit, low-fat yogurt, and fruit ices instead of pastries, cake, and cookies. Sherbet, angel food cake, gelatin dessert, frozen low-fat yogurt, or other frozen products that do not contain saturated fat (pure fruit juice bars, frozen ice pops) are also acceptable.   COOKING METHODS Choose those methods that use little or no fat. They include: Poaching.  Braising.  Steaming.  Grilling.  Baking.  Stir-frying.  Broiling.  Microwaving.  Foods can be cooked in a nonstick pan without added fat, or use a nonfat cooking spray in regular cookware. Limit fried foods and avoid frying in saturated fat. Add moisture to lean meats by using water, broth, cooking wines, and other nonfat or low-fat sauces along with the cooking methods mentioned above. Soups and stews should be chilled after cooking. The fat that forms on top after a few hours in the refrigerator should be skimmed off. When preparing meals, avoid using excess salt. Salt can contribute to raising blood pressure in some people.  EATING AWAY FROM HOME Order entres, potatoes, and vegetables without sauces or butter. When meat exceeds the size  of a deck of cards (3 to 4 ounces), the rest can be taken home for another meal. Choose vegetable or fruit salads and ask for low-calorie salad dressings to be served on the side. Use dressings sparingly. Limit high-fat toppings, such as bacon, crumbled eggs, cheese, sunflower seeds, and olives. Ask for heart-healthy tub margarine instead of butter.  High-Fiber Diet A high-fiber diet changes your normal diet to include more whole grains, legumes, fruits, and vegetables. Changes in the diet involve replacing refined carbohydrates with unrefined foods. The calorie level of the diet is essentially unchanged. The Dietary Reference Intake (recommended amount) for adult males is 38 grams per day. For adult females, it is 25 grams per day. Pregnant and lactating women should consume 28 grams of fiber per day. Fiber is the intact part of a plant that is not broken down during digestion. Functional fiber is fiber that has been isolated from the plant to provide a beneficial effect in the body. PURPOSE  Increase stool bulk.   Ease and regulate bowel movements.   Lower cholesterol.  INDICATIONS THAT YOU NEED MORE FIBER  Constipation and hemorrhoids.   Uncomplicated diverticulosis (intestine condition) and irritable bowel syndrome.   Weight management.   As a protective  measure against hardening of the arteries (atherosclerosis), diabetes, and cancer.   GUIDELINES FOR INCREASING FIBER IN THE DIET  Start adding fiber to the diet slowly. A gradual increase of about 5 more grams (2 slices of whole-wheat bread, 2 servings of most fruits or vegetables, or 1 bowl of high-fiber cereal) per day is best. Too rapid an increase in fiber may result in constipation, flatulence, and bloating.   Drink enough water and fluids to keep your urine clear or pale yellow. Water, juice, or caffeine-free drinks are recommended. Not drinking enough fluid may cause constipation.   Eat a variety of high-fiber foods rather  than one type of fiber.   Try to increase your intake of fiber through using high-fiber foods rather than fiber pills or supplements that contain small amounts of fiber.   The goal is to change the types of food eaten. Do not supplement your present diet with high-fiber foods, but replace foods in your present diet.  INCLUDE A VARIETY OF FIBER SOURCES  Replace refined and processed grains with whole grains, canned fruits with fresh fruits, and incorporate other fiber sources. White rice, white breads, and most bakery goods contain little or no fiber.   Brown whole-grain rice, buckwheat oats, and many fruits and vegetables are all good sources of fiber. These include: broccoli, Brussels sprouts, cabbage, cauliflower, beets, sweet potatoes, white potatoes (skin on), carrots, tomatoes, eggplant, squash, berries, fresh fruits, and dried fruits.   Cereals appear to be the richest source of fiber. Cereal fiber is found in whole grains and bran. Bran is the fiber-rich outer coat of cereal grain, which is largely removed in refining. In whole-grain cereals, the bran remains. In breakfast cereals, the largest amount of fiber is found in those with "bran" in their names. The fiber content is sometimes indicated on the label.   You may need to include additional fruits and vegetables each day.   In baking, for 1 cup white flour, you may use the following substitutions:   1 cup whole-wheat flour minus 2 tablespoons.   1/2 cup white flour plus 1/2 cup whole-wheat flour.

## 2012-01-31 NOTE — Progress Notes (Addendum)
Subjective:    Patient ID: Allison Sharp, female    DOB: 01/28/1956, 56 y.o.   MRN: [id number]  PCP: [facility]  HPI  LAST SEEN JAN 2011 C/O DYSPHAGIA-WEIGHT 370 LBS. EGD/DIL W/ PROPOFOL OCT 2010-EMPIRIC & BASW JAN 2011: NL  TROUBLE SWALLOWING SOLIDS. REFLUX OUT OF CONTROL FOR 2 MOS. GAINED UP TO 399 LBS AND NOW DOWN TO 378 LBS. STILL UP 8 LBS SINCE 201). TAKING MIRALAX TWICE AND MGCITRATE: 1-2 TIMES A WEEK. GOTS GAS FROM BELOW. BELCHING. REFLUX Sx: COMES BACK UP NASTY TASTE. APPETITE BEEN ALL RIGHT. AMITIZA 24 MCG MADE HER HAVE DIARRHEA EVEN WHEN SHE TOOL IOT EVERY OTHER DAY.  BM: ONCE A WEEK SINCE > 10 YEARS.  Past Medical History  Diagnosis Date  . Hyperlipidemia   . CAD (coronary artery disease)   . Bipolar 1 disorder   . Schizophrenia   . GERD (gastroesophageal reflux disease)   . Hyperthyroidism   . Chronic back pain   . Dyspnea     PFT 03/05/09 FEV1 2.77(98%), FVC 3.25(86%), FEV1% 85, TLC 5.88(99%), DLCO 60% ,  Methacholine challenge 03/16/09 normal ,  CT chest 03/12/09 no pulmonary disease  . Anxiety   . Arthritis   . Depression   . OSA on CPAP     2 liters  . HTN (hypertension)   . History of colonoscopy 10/17/2002    by Dr Rehman-> distal non-specific proctitis, small ext hemorrhoids,   . Fungal infection   . Cellulitis   . Contusion of sacrum   . Migraine headache   . Vitamin d deficiency   . Sleep apnea   . Myocardial infarction     NOV 1997  . Hypothyroidism     States she only has hyperthyroidism  . Complication of anesthesia     States she typically gets sick s/p anesthesia  . Morbid obesity with body mass index of 50.0-59.9 in adult JAN 2011 370 LBS BMI 54.6    2004 311 BMI 45.9     Past Surgical History  Procedure Date  . Cholecystectomy   . Knee arthroscopy   . Appendectomy   . Tonsillectomy   . Back surgery   . Total vaginal hysterectomy   . Abdominal hysterectomy     sept 1996  . Tubal ligation   . Colonoscopy 10/17/2002     Distal proctitis,  small external hemorrhoids, otherwise/  normal colonoscopy. Suspect rectal bleeding secondary to hemorrhoids  . Esophagogastroduodenoscopy 03/18/09    fundic gland polyps/mild gastritis  . Cardiac catheterization     nov 1997  . Joint replacement     bil knee replacement    Allergies  Allergen Reactions  . Ativan (Lorazepam) Other (See Comments)    Delirium  . Haldol (Haloperidol)     Unsure of reaction  . Naproxen Nausea And Vomiting    Current Outpatient Prescriptions  Medication Sig Dispense Refill  . albuterol (VENTOLIN HFA) 108 (90 BASE) MCG/ACT inhaler Inhale 2 puffs into the lungs every 6 (six) hours as needed for wheezing or shortness of breath.    Marland Kitchen aspirin 81 MG chewable tablet Chew 1 tablet (81 mg total) by mouth daily. For blood thinner.    Marland Kitchen atorvastatin (LIPITOR) 40 MG tablet Take 1 tablet (40 mg total) by mouth at bedtime. For high cholesterol.    . buprenorphine (BUTRANS) 10 MCG/HR PTWK Place 1 patch (10 mcg total) onto the skin once a week. For chronic moderate to severe pain.    Marland Kitchen buPROPion [facility]  SR) 150 MG 12 hr tablet Take 1 tablet (150 mg total) by mouth daily. For depression.    . chlorproMAZINE (THORAZINE) 10 MG tablet Take 1 tablet (10 mg total) by mouth at bedtime. For sleep and voices.    . cholecalciferol 2000 UNITS TABS Take 1 tablet (2,000 Units total) by mouth daily with breakfast. Nutritional supplement.    . divalproex (DEPAKOTE ER) 500 MG 24 hr tablet Take 2 tablets (1,000 mg total) by mouth at bedtime. For mood stabilization.    Marland Kitchen donepezil (ARICEPT) 10 MG tablet Take 1 tablet (10 mg total) by mouth at bedtime. For memory issues.    . DULoxetine (CYMBALTA) 60 MG capsule Take 1 capsule (60 mg total) by mouth 2 (two) times daily. For depression and pain.    . feeding supplement (ENSURE COMPLETE) LIQD Take 237 mLs by mouth daily at 8 pm. As a nutritional supplement.    . furosemide (LASIX) 20 MG tablet Take 1.5 tablets (30 mg total) by mouth daily.  For blood pressure control.    . levothyroxine (SYNTHROID, LEVOTHROID) 125 MCG tablet Take 1 tablet (125 mcg total) by mouth daily before breakfast. For hypothyroidism.    . Multiple Vitamin (MULTIVITAMIN WITH MINERALS) TABS Take 1 tablet by mouth daily. For nutritional supplementation.    . niacin (NIASPAN) 500 MG CR tablet Take 1 tablet (500 mg total) by mouth every evening. For elevated cholesterol.    Marland Kitchen omeprazole (PRILOSEC) 20 MG capsule Take 20 mg by mouth daily.     . polyethylene glycol (MIRALAX / GLYCOLAX) packet Take 17 g by mouth BID    . verapamil (CALAN-SR) 180 MG CR tablet Take 1 tablet (180 mg total) by mouth daily. For blood pressure.    . Menthol-Methyl Salicylate (GRX ANALGESIC BALM) OINT Apply 1 application topically 3 (three) times daily at 8am, 2pm and bedtime. For back pain.    Marland Kitchen nystatin (MYCOSTATIN/NYSTOP) 100000 UNIT/GM POWD Apply 1 g topically 3 (three) times daily as needed (redness and moisture).        Review of Systems     Objective:   Physical Exam  Constitutional: She is oriented to person, place, and time. She appears well-nourished. No distress.  HENT:  Head: Normocephalic and atraumatic.  Mouth/Throat: Oropharynx is clear and moist. No oropharyngeal exudate.  Eyes: Pupils are equal, round, and reactive to light. No scleral icterus.  Neck: Normal range of motion. Neck supple.  Cardiovascular: Normal rate, regular rhythm and normal heart sounds.   Pulmonary/Chest: Effort normal and breath sounds normal. No respiratory distress.  Abdominal: Soft. Bowel sounds are normal. She exhibits no distension. There is no tenderness.       OBESE   Musculoskeletal: She exhibits no edema.  Lymphadenopathy:    She has no cervical adenopathy.  Neurological: She is alert and oriented to person, place, and time.       WALKS ASSISTED WITH A CANE. HAS DIFFICULTY GETTING OUT OF CHAIR. NO  NEW FOCAL DEFICITS   Psychiatric:       FLAT AFFECT, NL MOOD            Assessment & Plan:

## 2012-01-31 NOTE — Assessment & Plan Note (Signed)
DUE TO MEDS AND LIKELY SLOW COLON TRANSIT. AMITZA 24 MCG BID GAVE HER DIARRHEA. THE GOAL IS TO HAVE 2-3 BMs A WEEK.  FOLLOW A LOW FAT/HIGH FIBER DIET. SEE INFO BELOW.  DRINK WATER TO KEEP URINE LIGHT YELLOW. AVOID FRUIT JUICE, SODA, AND SWEET TEA.  START AMITIZA 8 MCG AT BEDTIME. USE SAMPLE AND IF NO NAUSEA OR DIARRHEA, PICK UP PRESCRIPTION AND TAKE BID.  CONTINUE MIRALAX BID UNLESS YOU ARE HAVING DIARRHEA. IF YOU HAVE DIARRHEA. USE AS NEEDED.  SEE NUTRITION.  FOLLOW UP IN 4 MOS.

## 2012-01-31 NOTE — Progress Notes (Signed)
Faxed to PCP

## 2012-01-31 NOTE — Progress Notes (Signed)
Reminder in epic to follow up with SF in E[facility] in 4 months °

## 2012-01-31 NOTE — Addendum Note (Signed)
Addended by: Jennings Books on: 01/31/2012 09:29 AM   Modules accepted: Orders

## 2012-01-31 NOTE — Assessment & Plan Note (Signed)
  FOLLOW A LOW FAT/HIGH FIBER DIET. SEE INFO BELOW.  SEE NUTRITION.  FOLLOW UP IN 4 MOS.

## 2012-01-31 NOTE — Assessment & Plan Note (Signed)
UNCONTROLLED LIKELY DUE TO LIFESTYLE FACTORS. FAILED PROTONIX AND OMEPRAZOLE.  THE GOAL IS  GET YOUR REFLUX BETTER CONTROLLED.  Complete barium swallow.  STOP OMEPRAZOLE. START DEXILANT.  FOLLOW A LOW FAT/HIGH FIBER DIET. HO GIVEN.  DRINK WATER TO KEEP URINE LIGHT YELLOW. AVOID FRUIT JUICE, SODA, AND SWEET TEA.  SEE NUTRITION.  FOLLOW UP IN 4 MOS.

## 2012-01-31 NOTE — Assessment & Plan Note (Signed)
PT C/O DYSPHAGIA IN FEB 2012. GAINED 8 LBS SINCE LAST VISIT. SX LIKELY DUE TO UNCONTROLLED GERD.  THE GOAL IS TO HAVE 2-3 BMs A WEEK & GET YOUR REFLUX BETTER CONTROLLED.   Complete barium swallow.  STOP OMEPRAZOLE. START DEXILANT.  FOLLOW A LOW FAT/HIGH FIBER DIET. SEE INFO BELOW.  SEE NUTRITION.  FOLLOW UP IN 4 MOS.

## 2012-02-01 ENCOUNTER — Telehealth: Payer: Self-pay | Admitting: Gastroenterology

## 2012-02-01 NOTE — Telephone Encounter (Signed)
Referral has been faxed to [facility] consult at [facility] and she will contact the patient with date and time

## 2012-02-01 NOTE — Telephone Encounter (Signed)
REVIEWED.  

## 2012-02-01 NOTE — Progress Notes (Signed)
PT NEEDS A BARIUM PILL ESOPHAGRAM NOT JUST A BARIUM SWALLOW.

## 2012-02-05 ENCOUNTER — Telehealth (HOSPITAL_COMMUNITY): Payer: Self-pay | Admitting: Dietician

## 2012-02-05 ENCOUNTER — Ambulatory Visit (HOSPITAL_COMMUNITY): Payer: Medicare Other

## 2012-02-05 NOTE — Telephone Encounter (Signed)
Received referral via fax from Dr. Darrick Penna for dx: morbid obesity (low fat, high fiber calorie restricted diet with adequate protein intake) on 02/01/12.

## 2012-02-05 NOTE — Telephone Encounter (Signed)
Opened in error

## 2012-02-06 ENCOUNTER — Other Ambulatory Visit: Payer: Self-pay | Admitting: Obstetrics and Gynecology

## 2012-02-06 ENCOUNTER — Other Ambulatory Visit: Payer: Self-pay | Admitting: Family Medicine

## 2012-02-06 DIAGNOSIS — Z1231 Encounter for screening mammogram for malignant neoplasm of breast: Secondary | ICD-10-CM

## 2012-02-09 NOTE — Telephone Encounter (Signed)
Sent letter to pt home via [employer] in attempt to contact pt to schedule appointment.  

## 2012-02-11 NOTE — Discharge Summary (Signed)
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 in alcohol and or illegal drug use while on prescription medicines. In the event of worsening symptoms, patient is instructed to call the crisis hotline, 911 and or go to the nearest ED for appropriate evaluation and treatment of symptoms.  Signed: Franchot Gallo 02/11/2012 9:52 PM

## 2012-02-12 ENCOUNTER — Telehealth: Payer: Self-pay | Admitting: Gastroenterology

## 2012-02-12 NOTE — Telephone Encounter (Signed)
Routing to Julie.  

## 2012-02-12 NOTE — Telephone Encounter (Signed)
Pt called this afternoon to let us know that her insurance will not cover her amitiza or the new acid reflux medication we put her on. She asked if we could send in something else that her insurance will cover and is there any samples she can take to hold her over because her reflux has gotten really bad. [phone]

## 2012-02-12 NOTE — Telephone Encounter (Signed)
Pt informed. Protonix called to [facility] at [facility]. They have faxed over the info for the PA on the Amitiza. Number to call if needed is [phone].

## 2012-02-12 NOTE — Telephone Encounter (Signed)
We are already working on PA's for pts medications.

## 2012-02-12 NOTE — Telephone Encounter (Signed)
Try protonix 40mg  before breakfast & dinner, #63, 0RF Pt may pick up amitiza #20 samples to take BID prn constipation. Can we get Prior Auth?

## 2012-02-13 NOTE — Telephone Encounter (Signed)
Sent letter to pt home via [employer] in attempt to contact pt to schedule appointment.  

## 2012-02-15 ENCOUNTER — Ambulatory Visit (HOSPITAL_COMMUNITY): Payer: Self-pay | Admitting: Psychiatry

## 2012-02-15 ENCOUNTER — Other Ambulatory Visit (HOSPITAL_COMMUNITY): Payer: Self-pay | Admitting: *Deleted

## 2012-02-17 ENCOUNTER — Other Ambulatory Visit (HOSPITAL_COMMUNITY): Payer: Self-pay | Admitting: Psychiatry

## 2012-02-17 MED ORDER — BUPROPION HCL ER (SR) 150 MG PO TB12: 150.0000 mg | ORAL_TABLET | Freq: Every day | ORAL | Status: DC

## 2012-02-19 NOTE — Telephone Encounter (Signed)
Pt has not responded to attempts to contact to schedule appointment. Referral filed.  

## 2012-02-22 ENCOUNTER — Other Ambulatory Visit (HOSPITAL_COMMUNITY): Payer: Self-pay | Admitting: *Deleted

## 2012-02-27 ENCOUNTER — Other Ambulatory Visit (HOSPITAL_COMMUNITY): Payer: Self-pay | Admitting: *Deleted

## 2012-02-27 DIAGNOSIS — F319 Bipolar disorder, unspecified: Secondary | ICD-10-CM

## 2012-02-27 MED ORDER — BUPROPION HCL ER (SR) 150 MG PO TB12: 150.0000 mg | ORAL_TABLET | Freq: Every day | ORAL | Status: DC

## 2012-02-27 MED ORDER — CHLORPROMAZINE HCL 10 MG PO TABS: 10.0000 mg | ORAL_TABLET | Freq: Every day | ORAL | Status: DC

## 2012-02-27 NOTE — Telephone Encounter (Signed)
Prescriptions for Bupropion and Chlorpromazine were"Phoned IN" to pharmacy. "Print" function selected in error.

## 2012-02-27 NOTE — Telephone Encounter (Signed)
Please disregard routing information. RX authorized by [employer]

## 2012-02-27 NOTE — Telephone Encounter (Addendum)
Received faxed order for refills for Bupropion and Chlorpromazine

## 2012-02-28 ENCOUNTER — Other Ambulatory Visit (HOSPITAL_COMMUNITY): Payer: Self-pay | Admitting: *Deleted

## 2012-02-28 DIAGNOSIS — F319 Bipolar disorder, unspecified: Secondary | ICD-10-CM

## 2012-02-28 MED ORDER — CHLORPROMAZINE HCL 10 MG PO TABS: 10.0000 mg | ORAL_TABLET | Freq: Every day | ORAL | Status: DC

## 2012-02-28 MED ORDER — BUPROPION HCL ER (SR) 150 MG PO TB12: 150.0000 mg | ORAL_TABLET | Freq: Every day | ORAL | Status: DC

## 2012-02-28 NOTE — Telephone Encounter (Signed)
Pharmacy called.They stated they had not receivd the RX given yesterday by phone. Will resend electronically today

## 2012-02-29 ENCOUNTER — Ambulatory Visit (HOSPITAL_COMMUNITY): Payer: Self-pay | Admitting: Psychiatry

## 2012-02-29 ENCOUNTER — Ambulatory Visit
Admission: RE | Admit: 2012-02-29 | Discharge: 2012-02-29 | Disposition: A | Discharge status: Home / Self Care | Payer: Medicare Other | Source: Ambulatory Visit | Attending: Obstetrics and Gynecology | Admitting: Obstetrics and Gynecology

## 2012-02-29 DIAGNOSIS — Z1231 Encounter for screening mammogram for malignant neoplasm of breast: Secondary | ICD-10-CM

## 2012-03-04 ENCOUNTER — Other Ambulatory Visit (HOSPITAL_COMMUNITY): Payer: Self-pay

## 2012-03-05 ENCOUNTER — Ambulatory Visit (INDEPENDENT_AMBULATORY_CARE_PROVIDER_SITE_OTHER): Payer: Self-pay | Admitting: Psychiatry

## 2012-03-05 ENCOUNTER — Encounter (HOSPITAL_COMMUNITY): Payer: Self-pay | Admitting: Psychiatry

## 2012-03-05 VITALS — Wt 367.0 lb

## 2012-03-05 DIAGNOSIS — F319 Bipolar disorder, unspecified: Secondary | ICD-10-CM

## 2012-03-05 MED ORDER — DIVALPROEX SODIUM ER 500 MG PO TB24: 1000.0000 mg | ORAL_TABLET | Freq: Every day | ORAL | Status: DC

## 2012-03-05 MED ORDER — DULOXETINE HCL 60 MG PO CPEP: 60.0000 mg | ORAL_CAPSULE | Freq: Two times a day (BID) | ORAL | Status: DC

## 2012-03-05 MED ORDER — CHLORPROMAZINE HCL 25 MG PO TABS: 25.0000 mg | ORAL_TABLET | Freq: Every day | ORAL | Status: DC

## 2012-03-05 NOTE — Progress Notes (Signed)
Chief complaint I cannot sleep.  However I'm doing better on the medication.  I'm very happy he lost weight.            History of presenting illness Patient is 56 year old Caucasian female who came for her follow up appointment.  Patient is doing better on her medication.  She is complying with her medication and denies any side effects.  She complained insomnia and despite taking Depakote and all her other medication she only sleeps 2-3 hours.  She's staying with her sister and her plan is to move at assisted-living center when bed is available.  She feels her living situation is going to settle basal.  She is wondering if Thorazine can be increased which was reduced on her last hospitalization.  She's not sleeping well with Thorazine 10 mg.  I remember higher dose of Thorazine was making her very sedated and restless.  I recommend to try Thorazine 25 mg and if she still has insomnia and then she can discuss this issue on her next visit.  Overall patient doing better.  She denies any agitation anger mood swing.  She denies any crying spells.  She is more pleasant and cooperative with the family members.  She's not drinking or using any illegal substance.  She is more active and able to reduce her weight from her last visit.  She is more cautious about her diet and calorie intake.  Current psychiatric medication Depakote 1000 mg at bedtime, last level  54.9 on August 21. Cmbalta 60 mg twice a day Thorazine 10 mg at bedtime.  Wellbutrin SR 150 mg daily She's also taking Aricept 10 mg a neurologist.  Past psychiatric history Patient has multiple psychiatric admission. She has at least 3 his suicidal attempt in the past. Her last admission was in August 2013.  In the past she had tried Paxil Prozac Wellbutrin Effexor Lexapro amitriptyline however she always had a good response with Cymbalta but it causes constipation. She has been diagnosed with bipolar disorder  Medical history Patient has history of  hyperlipidemia, GERD, chronic back pain, obesity, arthritis, hypertension, abstract and sleep apnea, vitamin D deficiency and history of myocardial infarction. Her primary care physician is  Dr. Ileana Ladd in [facility].  Family history Patient has family member who has been diagnosed with bipolar disorder  Social history Patient currently living with her sister .  She has a plan to move into assisted living in the future.    Mental status examination Patient is casually dressed and fairly groomed.  She is obese female.  She maintained fair eye contact.  Her speech is fast but clear and coherent.  Her thought process is also fast but logical linear and goal-directed.  She appears today much, .  She described her mood is good and her affect is bright.  He denies any active or passive suicidal thoughts or homicidal thoughts.  She denies any auditory or visual hallucination.  Her fund of knowledge is adequate.  There were no tremors or shakes.  She denies any delusion or paranoid thinking.  Her attention and concentration is better.  She's alert oriented x3.  Her insight judgment and impulse control is okay.  Assessment Axis I bipolar disorder  Axis II deferred  Axis III see medical history Axis IV mild to moderate  Axis V 55-60  Plan I reviewed psychosocial stressors, medication update,  her living situation and response to the medication.  Patient complained of insomnia .  I  would increase her Thorazine to 25 mg at bedtime.  Patient use to take much higher doses approaching however she complaint off sedation and tired feeling in the daytime.  I recommend to call us if she is any question or concern if she feels worsening of the symptom.  I discuss in detail the risk and benefits of medication including extrapyramidal side effects with Thorazine.  I also discussed that is a possibility she may see a new psychiatrist on her next visit since I am going full-time to  [place] office.  Greater than 50% of the time is a spent on counseling and coordination of care.  Followup in 4 weeks.    Portion of this note is generated with voice dictation software and may contain typographical error.

## 2012-03-13 ENCOUNTER — Other Ambulatory Visit (HOSPITAL_COMMUNITY): Payer: Self-pay | Admitting: *Deleted

## 2012-03-13 ENCOUNTER — Other Ambulatory Visit (HOSPITAL_COMMUNITY): Payer: Self-pay | Admitting: Psychiatry

## 2012-03-13 DIAGNOSIS — F319 Bipolar disorder, unspecified: Secondary | ICD-10-CM

## 2012-03-14 ENCOUNTER — Other Ambulatory Visit (HOSPITAL_COMMUNITY): Payer: Self-pay | Admitting: Psychiatry

## 2012-03-14 ENCOUNTER — Telehealth (HOSPITAL_COMMUNITY): Payer: Self-pay | Admitting: *Deleted

## 2012-03-14 DIAGNOSIS — F319 Bipolar disorder, unspecified: Secondary | ICD-10-CM

## 2012-03-15 MED ORDER — CHLORPROMAZINE HCL 50 MG PO TABS: 50.0000 mg | ORAL_TABLET | Freq: Every day | ORAL | Status: DC

## 2012-03-15 NOTE — Telephone Encounter (Signed)
Call returned.  Patient complained of poor sleep and Thorazine 25 mg.  She had tried 50 mg in the past.  She is requesting to increase her Thorazine.  I recommend that we will try 50 mg however if she started to feel a dizzy then she need to inform us immediately.  Patient acknowledged.

## 2012-03-19 ENCOUNTER — Other Ambulatory Visit (HOSPITAL_COMMUNITY): Payer: Self-pay | Admitting: Psychiatry

## 2012-03-19 DIAGNOSIS — F319 Bipolar disorder, unspecified: Secondary | ICD-10-CM

## 2012-03-19 MED ORDER — BUPROPION HCL ER (SR) 150 MG PO TB12: 150.0000 mg | ORAL_TABLET | Freq: Every day | ORAL | Status: DC

## 2012-04-04 ENCOUNTER — Encounter (HOSPITAL_COMMUNITY): Payer: Self-pay | Admitting: Psychiatry

## 2012-04-04 ENCOUNTER — Ambulatory Visit (HOSPITAL_COMMUNITY): Payer: Self-pay | Admitting: Psychiatry

## 2012-04-04 ENCOUNTER — Ambulatory Visit (INDEPENDENT_AMBULATORY_CARE_PROVIDER_SITE_OTHER): Payer: Self-pay | Admitting: Psychiatry

## 2012-04-04 VITALS — BP 122/84 | HR 64 | Ht 68.0 in | Wt 363.8 lb

## 2012-04-04 DIAGNOSIS — F429 Obsessive-compulsive disorder, unspecified: Secondary | ICD-10-CM

## 2012-04-04 DIAGNOSIS — F250 Schizoaffective disorder, bipolar type: Secondary | ICD-10-CM

## 2012-04-04 DIAGNOSIS — E559 Vitamin D deficiency, unspecified: Secondary | ICD-10-CM

## 2012-04-04 DIAGNOSIS — F319 Bipolar disorder, unspecified: Secondary | ICD-10-CM

## 2012-04-04 DIAGNOSIS — F4310 Post-traumatic stress disorder, unspecified: Secondary | ICD-10-CM

## 2012-04-04 DIAGNOSIS — F5105 Insomnia due to other mental disorder: Secondary | ICD-10-CM

## 2012-04-04 DIAGNOSIS — F419 Anxiety disorder, unspecified: Secondary | ICD-10-CM | POA: Insufficient documentation

## 2012-04-04 MED ORDER — DIVALPROEX SODIUM ER 500 MG PO TB24: 1000.0000 mg | ORAL_TABLET | Freq: Every day | ORAL | Status: DC

## 2012-04-04 MED ORDER — BUPROPION HCL ER (SR) 150 MG PO TB12: 150.0000 mg | ORAL_TABLET | Freq: Every day | ORAL | Status: DC

## 2012-04-04 MED ORDER — CHLORPROMAZINE HCL 50 MG PO TABS: 50.0000 mg | ORAL_TABLET | Freq: Every day | ORAL | Status: AC

## 2012-04-04 MED ORDER — SPHYGMOMANOMETER MISC: 1.0000 | Freq: Once | Status: DC

## 2012-04-04 MED ORDER — DULOXETINE HCL 60 MG PO CPEP: 60.0000 mg | ORAL_CAPSULE | Freq: Two times a day (BID) | ORAL | Status: DC

## 2012-04-04 MED ORDER — CHLORPROMAZINE HCL 25 MG PO TABS: 25.0000 mg | ORAL_TABLET | Freq: Three times a day (TID) | ORAL | Status: DC
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
## 2012-04-04 NOTE — Patient Instructions (Addendum)
SAMe 200mg  at night might help with getting to sleep or pushing to 400mg  at night might help with getting to sleep.  It also may help with managing stress, anxiety, or depression and KNEE pain.  [employer] is the best brand.    BEHOLD BEAUTY!!!!!  Get to church and get a back up plan for rides there.   Keep using the CD for relaxation and for going to sleep with at bed time.

## 2012-04-16 ENCOUNTER — Other Ambulatory Visit (HOSPITAL_COMMUNITY): Payer: Self-pay | Admitting: *Deleted

## 2012-04-22 ENCOUNTER — Other Ambulatory Visit (HOSPITAL_COMMUNITY): Payer: Self-pay | Admitting: Psychiatry

## 2012-05-06 ENCOUNTER — Ambulatory Visit (INDEPENDENT_AMBULATORY_CARE_PROVIDER_SITE_OTHER): Payer: Self-pay | Admitting: Psychiatry

## 2012-05-06 ENCOUNTER — Encounter (HOSPITAL_COMMUNITY): Payer: Self-pay | Admitting: Psychiatry

## 2012-05-06 VITALS — Wt 360.2 lb

## 2012-05-06 DIAGNOSIS — F319 Bipolar disorder, unspecified: Secondary | ICD-10-CM

## 2012-05-06 DIAGNOSIS — F429 Obsessive-compulsive disorder, unspecified: Secondary | ICD-10-CM

## 2012-05-06 DIAGNOSIS — F250 Schizoaffective disorder, bipolar type: Secondary | ICD-10-CM

## 2012-05-06 DIAGNOSIS — F419 Anxiety disorder, unspecified: Secondary | ICD-10-CM

## 2012-05-06 DIAGNOSIS — F4310 Post-traumatic stress disorder, unspecified: Secondary | ICD-10-CM

## 2012-05-06 DIAGNOSIS — E039 Hypothyroidism, unspecified: Secondary | ICD-10-CM

## 2012-05-06 DIAGNOSIS — G4700 Insomnia, unspecified: Secondary | ICD-10-CM

## 2012-05-06 MED ORDER — BUPROPION HCL ER (SR) 150 MG PO TB12: 150.0000 mg | ORAL_TABLET | Freq: Every day | ORAL | Status: DC

## 2012-05-06 MED ORDER — DIVALPROEX SODIUM ER 500 MG PO TB24: 1000.0000 mg | ORAL_TABLET | Freq: Every day | ORAL | Status: DC

## 2012-05-06 MED ORDER — MIRTAZAPINE 7.5 MG PO TABS: 7.5000 mg | ORAL_TABLET | Freq: Every day | ORAL | Status: DC

## 2012-05-06 MED ORDER — DULOXETINE HCL 60 MG PO CPEP: 60.0000 mg | ORAL_CAPSULE | Freq: Every day | ORAL | Status: DC

## 2012-05-06 NOTE — Patient Instructions (Addendum)
Get Depakote level today.  Stop the Thorazine, in substitution will try the Remeron.  Try Remeron for the nightmares and daytime anxiety.  An alternative is Minipress.  Use meditation!  (That is listening to God or your higher power.)  Getting into yoga.  Yoga by Renee Ramus on DVD would be a very good alternative to actual classes.    Listening to relaxing music and soaking in a bath tub and using bubble bath soap bubbles is helpful too.   Fragrances that are calming to you should be used every day.   "I am Wishes Fulfilled Meditation" by Marylene Buerger and Lyndal Pulley may be helpful for meditation  We are aiming for BALANCE.  Call if problems.

## 2012-05-06 NOTE — Progress Notes (Signed)
Chief complaint Chief Complaint  Patient presents with  . Follow-up  . Manic Behavior  . Medication Refill   Subjective: I've lost almost 65 pounds!  I've been having nightmares and I am sleepy during the day.    History of presenting illness Patient is 56 year old Caucasian female who came for her follow up appointment.  She has stopped taking the Thorazine during the day and is not as sleepy during the day.  She is having nightmares and screaming in her CPAP mask.  She has never taken Minipress.  She has never taken Remeron either. The Remeron may be the better choice now. Will use it instead of the Thorazine.  The Remeron may help the day time anxiety as well as the nightmares.  She is not noting much memory advancement with the Aricept.  Due for Depakote level.  Current psychiatric medication Depakote 1000 mg at bedtime, will get level on Monday Cmbalta 60 mg twice a day Thorazine 10 mg at bedtime. Wellbutrin SR 150 mg daily She's also taking Aricept 10 mg a neurologist.  Past psychiatric history Patient has multiple psychiatric admission. She has at least 3 his suicidal attempt in the past. Her last admission was in August 2013.  In the past she had tried Paxil Prozac Wellbutrin Effexor Lexapro amitriptyline however she always had a good response with Cymbalta but it causes constipation, which is relieved by Miralax. She has been diagnosed with bipolar disorder  Medical history Patient has history of hyperlipidemia, GERD, chronic back pain, obesity, arthritis, hypertension, abstract and sleep apnea, vitamin D deficiency and history of myocardial infarction. Her primary care physician is  Dr. Ileana Ladd in [facility].  Family history Patient has family member who has been diagnosed with bipolar disorder  Social history Patient currently living with her sister .  She has a plan to move into assisted living in the future.    Mental status  examination Patient is casually dressed and fairly groomed.  She is obese female.  She maintained fair eye contact.  Her speech is fast but clear and coherent.  Her thought process is also fast but logical linear and goal-directed.  She appears today much, .  She described her mood is good and her affect is bright.  He denies any active or passive suicidal thoughts or homicidal thoughts.  She denies any auditory or visual hallucination.  Her fund of knowledge is adequate.  There were no tremors or shakes.  She denies any delusion or paranoid thinking.  Her attention and concentration is better.  She's alert oriented x3.  Her insight judgment and impulse control is okay.  The same today.   Assessment Axis I bipolar disorder, OCD, PTSD, record indicates schizophrenia, but that may be doubtful.  Axis II deferred  Axis III see medical history Axis IV mild to moderate  Axis V 55-60  Plan I took her vitals.  I reviewed CC, tobacco/med/surg Hx, meds effects/ side effects, problem list, therapies and responses as well as current situation/symptoms discussed options. See orders and pt instructions for more details.  Dan Humphreys, Marlaya Turck 05/06/2012

## 2012-05-06 NOTE — Addendum Note (Signed)
Addended by: Mike Craze on: 05/06/2012 12:02 PM   Modules accepted: Orders

## 2012-05-08 ENCOUNTER — Encounter: Payer: Self-pay | Admitting: *Deleted

## 2012-06-03 ENCOUNTER — Ambulatory Visit (HOSPITAL_COMMUNITY): Payer: Self-pay | Admitting: Psychiatry

## 2012-06-12 ENCOUNTER — Telehealth (HOSPITAL_COMMUNITY): Payer: Self-pay | Admitting: Psychiatry

## 2012-06-12 DIAGNOSIS — F250 Schizoaffective disorder, bipolar type: Secondary | ICD-10-CM

## 2012-06-12 NOTE — Telephone Encounter (Signed)
Lab ordered.

## 2012-06-19 NOTE — Telephone Encounter (Signed)
Phone message completed in the phone message section.  

## 2012-07-04 ENCOUNTER — Ambulatory Visit (HOSPITAL_COMMUNITY): Payer: Self-pay | Admitting: Psychiatry

## 2012-07-10 ENCOUNTER — Other Ambulatory Visit (HOSPITAL_COMMUNITY): Payer: Self-pay | Admitting: Psychiatry

## 2012-07-10 DIAGNOSIS — F250 Schizoaffective disorder, bipolar type: Secondary | ICD-10-CM

## 2012-07-10 NOTE — Telephone Encounter (Signed)
Pt has appointment on 2/14 at 9:00 AM. Script sent via eScript.

## 2012-07-12 ENCOUNTER — Ambulatory Visit (HOSPITAL_COMMUNITY): Payer: Self-pay | Admitting: Psychiatry

## 2012-07-16 ENCOUNTER — Telehealth (HOSPITAL_COMMUNITY): Payer: Self-pay | Admitting: Psychiatry

## 2012-07-16 DIAGNOSIS — R413 Other amnesia: Secondary | ICD-10-CM

## 2012-07-16 MED ORDER — DONEPEZIL HCL 10 MG PO TABS: 10.0000 mg | ORAL_TABLET | Freq: Every day | ORAL | Status: DC

## 2012-07-16 NOTE — Telephone Encounter (Signed)
Script approved for 14 days until appointment.

## 2012-07-22 ENCOUNTER — Other Ambulatory Visit (HOSPITAL_COMMUNITY): Payer: Self-pay | Admitting: Psychiatry

## 2012-07-22 DIAGNOSIS — F4310 Post-traumatic stress disorder, unspecified: Secondary | ICD-10-CM

## 2012-07-22 DIAGNOSIS — R413 Other amnesia: Secondary | ICD-10-CM

## 2012-07-22 NOTE — Telephone Encounter (Signed)
Refill request approved via eScripts.  

## 2012-07-29 ENCOUNTER — Ambulatory Visit (INDEPENDENT_AMBULATORY_CARE_PROVIDER_SITE_OTHER): Payer: No Typology Code available for payment source | Admitting: Psychiatry

## 2012-07-29 ENCOUNTER — Encounter (HOSPITAL_COMMUNITY): Payer: Self-pay | Admitting: Psychiatry

## 2012-07-29 VITALS — Wt 360.6 lb

## 2012-07-29 DIAGNOSIS — F5105 Insomnia due to other mental disorder: Secondary | ICD-10-CM

## 2012-07-29 DIAGNOSIS — F4310 Post-traumatic stress disorder, unspecified: Secondary | ICD-10-CM

## 2012-07-29 DIAGNOSIS — G4700 Insomnia, unspecified: Secondary | ICD-10-CM

## 2012-07-29 DIAGNOSIS — R413 Other amnesia: Secondary | ICD-10-CM

## 2012-07-29 DIAGNOSIS — F419 Anxiety disorder, unspecified: Secondary | ICD-10-CM

## 2012-07-29 DIAGNOSIS — F429 Obsessive-compulsive disorder, unspecified: Secondary | ICD-10-CM

## 2012-07-29 DIAGNOSIS — F319 Bipolar disorder, unspecified: Secondary | ICD-10-CM

## 2012-07-29 DIAGNOSIS — F250 Schizoaffective disorder, bipolar type: Secondary | ICD-10-CM

## 2012-07-29 MED ORDER — DULOXETINE HCL 60 MG PO CPEP: 60.0000 mg | ORAL_CAPSULE | Freq: Two times a day (BID) | ORAL | Status: DC

## 2012-07-29 MED ORDER — DONEPEZIL HCL 10 MG PO TABS: 10.0000 mg | ORAL_TABLET | Freq: Every day | ORAL | Status: DC

## 2012-07-29 MED ORDER — DIVALPROEX SODIUM ER 500 MG PO TB24: 1000.0000 mg | ORAL_TABLET | Freq: Every day | ORAL | Status: DC

## 2012-07-29 MED ORDER — BUPROPION HCL ER (SR) 150 MG PO TB12: 150.0000 mg | ORAL_TABLET | Freq: Every day | ORAL | Status: DC

## 2012-07-29 MED ORDER — MIRTAZAPINE 7.5 MG PO TABS: 7.5000 mg | ORAL_TABLET | Freq: Every day | ORAL | Status: DC

## 2012-07-29 NOTE — Patient Instructions (Addendum)
For arthritic pain.  Tumeric is also helpful for arthritis.   Krill oil and cod liver oil may be helpful for arthritis.   [employer] is a great source for all of these.  [phone]  Glori Luis is a mushroom that has the strongest antiinflammatory properties of any substance known to mankind.  It can be ordered from [email or web address]  Yoga is a very helpful exercise method.  On TV, on line, or by DVD Adelfa Koh is a source of high quality information about yoga and videos on yoga.  Renee Ramus is the world's number one video yoga instructor according to some experts.  There are exceptional health benefits that can be achieved through yoga.  The main principles of yoga is acceptance, no competition, no comparison, and no judgement.  It is exceptional in helping people meditate and get to a very relaxed state.   Call if problems or concerns.

## 2012-07-29 NOTE — Progress Notes (Signed)
[facility] [id number] Progress Note Allison Sharp MRN: [id number] DOB: 02/03/1956 Age: 57 y.o.  Date: 07/29/2012 Start Time: 1:15 PM End Time: 1:45 PM  Chief Complaint: Chief Complaint  Patient presents with  . Depression  . Follow-up  . Medication Refill   Subjective: "Im holding my own.  I've moved in with my other sister's house". Depression 0/10 and Anxiety 6/10, where 1 is the best and 10 is the worst.  Pain is 6/10 is primarily in her knees and her swollen hands.  History of presenting illness Patient is 57 year old Caucasian female who came for her follow up appointment.  Pt reports that she is compliant with the psychotropic medications with good benefit and no noticeable side effects. She is in bright spirits and looking the best as I have seen her in over a year period of time.  The move to her other sister's house suits her well.  She is looking forward to walking in the nearby park.  Depakote level is 81.2.  Current psychiatric medication Depakote 1000 mg at bedtime, will get level on Monday Remeron 7.5 mg at bedtime Cymbalta 60 mg at night Aricept 10 mg in AM Wellbutrin SR 150 mg daily  Past psychiatric history Patient has multiple psychiatric admission. She has at least 3 his suicidal attempt in the past. Her last admission was in August 2013.  In the past she had tried Paxil Prozac Wellbutrin Effexor Lexapro amitriptyline however she always had a good response with Cymbalta but it causes constipation, which is relieved by Miralax. She has been diagnosed with bipolar disorder  Medical history Patient has history of hyperlipidemia, GERD, chronic back pain, obesity, arthritis, hypertension, abstract and sleep apnea, vitamin D deficiency and history of myocardial infarction. Her primary care physician is  Dr. Ileana Sharp in [facility].  Family history Patient has family member who has been diagnosed with bipolar disorder Family  History family history includes Alcohol abuse in her father; Bipolar disorder in her brother, mother, and sister; Cancer in her mother; Coronary artery disease in her brother and father; Dementia in her mother; Depression in her brother, mother, and sisters; Hypertension in her brother and mother; and Paranoid behavior in her sister.  There is no history of Anesthesia problems, and Hypotension, and Malignant hyperthermia, and Pseudochol deficiency, .  Social history Patient currently living with her sister .  She has a plan to move into assisted living in the future.    Mental status examination Patient is casually dressed and fairly groomed.  She is obese female.  She maintained fair eye contact.  Her speech is fast but clear and coherent.  Her thought process is also fast but logical linear and goal-directed.  She appears today much, .  She described her mood is good and her affect is bright.  He denies any active or passive suicidal thoughts or homicidal thoughts.  She denies any auditory or visual hallucination.  Her fund of knowledge is adequate.  There were no tremors or shakes.  She denies any delusion or paranoid thinking.  Her attention and concentration is the best as I have seen with her.  She's alert oriented x3.  Her insight judgment and impulse control is okay.    Lab Results:  Results for orders placed in visit on 06/12/12 (from the past 8736 hour(s))  VALPROIC ACID LEVEL   Collection Time    06/12/12  2:40 PM      Result Value Range   Valproic  Acid Lvl 81.2  50.0 - 100.0 ug/mL  Results for orders placed during the hospital encounter of 01/09/12 (from the past 8736 hour(s))  URINALYSIS, ROUTINE W REFLEX MICROSCOPIC   Collection Time    01/12/12  6:39 PM      Result Value Range   Color, Urine YELLOW  YELLOW   APPearance TURBID (*) CLEAR   Specific Gravity, Urine 1.012  1.005 - 1.030   pH 6.5  5.0 - 8.0   Glucose, UA NEGATIVE  NEGATIVE mg/dL   Hgb urine dipstick MODERATE (*)  NEGATIVE   Bilirubin Urine NEGATIVE  NEGATIVE   Ketones, ur NEGATIVE  NEGATIVE mg/dL   Protein, ur NEGATIVE  NEGATIVE mg/dL   Urobilinogen, UA 1.0  0.0 - 1.0 mg/dL   Nitrite NEGATIVE  NEGATIVE   Leukocytes, UA MODERATE (*) NEGATIVE  URINE MICROSCOPIC-ADD ON   Collection Time    01/12/12  6:39 PM      Result Value Range   Squamous Epithelial / LPF MANY (*) RARE   WBC, UA 0-2  <3 WBC/hpf   RBC / HPF 0-2  <3 RBC/hpf   Bacteria, UA FEW (*) RARE   Casts HYALINE CASTS (*) NEGATIVE   Urine-Other AMORPHOUS URATES/PHOSPHATES    VALPROIC ACID LEVEL   Collection Time    01/12/12  7:36 PM      Result Value Range   Valproic Acid Lvl 61.9  50.0 - 100.0 ug/mL  COMPREHENSIVE METABOLIC PANEL   Collection Time    01/12/12  7:36 PM      Result Value Range   Sodium 140  135 - 145 mEq/L   Potassium 3.9  3.5 - 5.1 mEq/L   Chloride 102  96 - 112 mEq/L   CO2 33 (*) 19 - 32 mEq/L   Glucose, Bld 122 (*) 70 - 99 mg/dL   BUN 9  6 - 23 mg/dL   Creatinine, Ser 8.65  0.50 - 1.10 mg/dL   Calcium 9.2  8.4 - 78.4 mg/dL   Total Protein 6.4  6.0 - 8.3 g/dL   Albumin 3.3 (*) 3.5 - 5.2 g/dL   AST 15  0 - 37 U/L   ALT 14  0 - 35 U/L   Alkaline Phosphatase 83  39 - 117 U/L   Total Bilirubin 0.3  0.3 - 1.2 mg/dL   GFR calc non Af Amer 66 (*) >90 mL/min   GFR calc Af Amer 77 (*) >90 mL/min  COMPREHENSIVE METABOLIC PANEL   Collection Time    01/17/12  7:37 PM      Result Value Range   Sodium 138  135 - 145 mEq/L   Potassium 4.0  3.5 - 5.1 mEq/L   Chloride 101  96 - 112 mEq/L   CO2 30  19 - 32 mEq/L   Glucose, Bld 115 (*) 70 - 99 mg/dL   BUN 9  6 - 23 mg/dL   Creatinine, Ser 6.96  0.50 - 1.10 mg/dL   Calcium 9.6  8.4 - 29.5 mg/dL   Total Protein 6.5  6.0 - 8.3 g/dL   Albumin 3.3 (*) 3.5 - 5.2 g/dL   AST 17  0 - 37 U/L   ALT 10  0 - 35 U/L   Alkaline Phosphatase 74  39 - 117 U/L   Total Bilirubin 0.4  0.3 - 1.2 mg/dL   GFR calc non Af Amer 75 (*) >90 mL/min   GFR calc Af Amer 86 (*) >90 mL/min   VALPROIC  ACID LEVEL   Collection Time    01/17/12  7:37 PM      Result Value Range   Valproic Acid Lvl 54.9  50.0 - 100.0 ug/mL  URINALYSIS, ROUTINE W REFLEX MICROSCOPIC   Collection Time    01/17/12  9:25 PM      Result Value Range   Color, Urine AMBER (*) YELLOW   APPearance CLOUDY (*) CLEAR   Specific Gravity, Urine 1.027  1.005 - 1.030   pH 6.5  5.0 - 8.0   Glucose, UA NEGATIVE  NEGATIVE mg/dL   Hgb urine dipstick SMALL (*) NEGATIVE   Bilirubin Urine SMALL (*) NEGATIVE   Ketones, ur NEGATIVE  NEGATIVE mg/dL   Protein, ur NEGATIVE  NEGATIVE mg/dL   Urobilinogen, UA 1.0  0.0 - 1.0 mg/dL   Nitrite NEGATIVE  NEGATIVE   Leukocytes, UA LARGE (*) NEGATIVE  URINE MICROSCOPIC-ADD ON   Collection Time    01/17/12  9:25 PM      Result Value Range   Squamous Epithelial / LPF FEW (*) RARE   WBC, UA 11-20  <3 WBC/hpf   RBC / HPF 3-6  <3 RBC/hpf   Bacteria, UA MANY (*) RARE   Crystals CA OXALATE CRYSTALS (*) NEGATIVE   Urine-Other MUCOUS PRESENT    Results for orders placed during the hospital encounter of 01/07/12 (from the past 8736 hour(s))  URINE CULTURE   Collection Time    01/07/12  3:36 PM      Result Value Range   Specimen Description URINE, CLEAN CATCH     Special Requests NONE     Culture  Setup Time 01/08/2012 03:15     Colony Count 20,OOO COLONIES/ML     Culture KLEBSIELLA PNEUMONIAE     Report Status 01/10/2012 FINAL     Organism ID, Bacteria KLEBSIELLA PNEUMONIAE    URINALYSIS, ROUTINE W REFLEX MICROSCOPIC   Collection Time    01/07/12  3:36 PM      Result Value Range   Color, Urine YELLOW  YELLOW   APPearance CLOUDY (*) CLEAR   Specific Gravity, Urine 1.016  1.005 - 1.030   pH 7.5  5.0 - 8.0   Glucose, UA NEGATIVE  NEGATIVE mg/dL   Hgb urine dipstick NEGATIVE  NEGATIVE   Bilirubin Urine NEGATIVE  NEGATIVE   Ketones, ur NEGATIVE  NEGATIVE mg/dL   Protein, ur NEGATIVE  NEGATIVE mg/dL   Urobilinogen, UA 2.0 (*) 0.0 - 1.0 mg/dL   Nitrite NEGATIVE  NEGATIVE    Leukocytes, UA MODERATE (*) NEGATIVE  URINE MICROSCOPIC-ADD ON   Collection Time    01/07/12  3:36 PM      Result Value Range   Squamous Epithelial / LPF RARE  RARE   WBC, UA 11-20  <3 WBC/hpf   Bacteria, UA MANY (*) RARE  URINE RAPID DRUG SCREEN (HOSP PERFORMED)   Collection Time    01/07/12  3:37 PM      Result Value Range   Opiates NONE DETECTED  NONE DETECTED   Cocaine NONE DETECTED  NONE DETECTED   Benzodiazepines POSITIVE (*) NONE DETECTED   Amphetamines NONE DETECTED  NONE DETECTED   Tetrahydrocannabinol NONE DETECTED  NONE DETECTED   Barbiturates NONE DETECTED  NONE DETECTED  CBC WITH DIFFERENTIAL   Collection Time    01/07/12  4:19 PM      Result Value Range   WBC 8.5  4.0 - 10.5 K/uL   RBC 4.60  3.87 - 5.11 MIL/uL   Hemoglobin  13.9  12.0 - 15.0 g/dL   HCT 40.9  81.1 - 91.4 %   MCV 91.1  78.0 - 100.0 fL   MCH 30.2  26.0 - 34.0 pg   MCHC 33.2  30.0 - 36.0 g/dL   RDW 78.2  95.6 - 21.3 %   Platelets 137 (*) 150 - 400 K/uL   Neutrophils Relative 56  43 - 77 %   Neutro Abs 4.7  1.7 - 7.7 K/uL   Lymphocytes Relative 29  12 - 46 %   Lymphs Abs 2.5  0.7 - 4.0 K/uL   Monocytes Relative 11  3 - 12 %   Monocytes Absolute 1.0  0.1 - 1.0 K/uL   Eosinophils Relative 3  0 - 5 %   Eosinophils Absolute 0.3  0.0 - 0.7 K/uL   Basophils Relative 1  0 - 1 %   Basophils Absolute 0.1  0.0 - 0.1 K/uL  COMPREHENSIVE METABOLIC PANEL   Collection Time    01/07/12  4:19 PM      Result Value Range   Sodium 138  135 - 145 mEq/L   Potassium 3.8  3.5 - 5.1 mEq/L   Chloride 99  96 - 112 mEq/L   CO2 32  19 - 32 mEq/L   Glucose, Bld 108 (*) 70 - 99 mg/dL   BUN 8  6 - 23 mg/dL   Creatinine, Ser 0.86  0.50 - 1.10 mg/dL   Calcium 9.6  8.4 - 57.8 mg/dL   Total Protein 7.1  6.0 - 8.3 g/dL   Albumin 3.6  3.5 - 5.2 g/dL   AST 38 (*) 0 - 37 U/L   ALT 39 (*) 0 - 35 U/L   Alkaline Phosphatase 120 (*) 39 - 117 U/L   Total Bilirubin 0.4  0.3 - 1.2 mg/dL   GFR calc non Af Amer 78 (*) >90  mL/min   GFR calc Af Amer >90  >90 mL/min  ETHANOL   Collection Time    01/07/12  4:19 PM      Result Value Range   Alcohol, Ethyl (B) <11  0 - 11 mg/dL  ACETAMINOPHEN LEVEL   Collection Time    01/07/12  4:19 PM      Result Value Range   Acetaminophen (Tylenol), Serum <15.0  10 - 30 ug/mL  VALPROIC ACID LEVEL   Collection Time    01/07/12  7:04 PM      Result Value Range   Valproic Acid Lvl 27.1 (*) 50.0 - 100.0 ug/mL  T3   Collection Time    01/07/12 11:44 PM      Result Value Range   T3, Total 91.9  80.0 - 204.0 ng/dl  T4   Collection Time    01/07/12 11:44 PM      Result Value Range   T4, Total 6.7  5.0 - 12.5 ug/dL  TSH   Collection Time    01/07/12 11:44 PM      Result Value Range   TSH 3.536  0.350 - 4.500 uIU/mL  Results for orders placed during the hospital encounter of 01/05/12 (from the past 8736 hour(s))  COMPREHENSIVE METABOLIC PANEL   Collection Time    01/05/12  3:16 PM      Result Value Range   Sodium 138  135 - 145 mEq/L   Potassium 4.2  3.5 - 5.1 mEq/L   Chloride 100  96 - 112 mEq/L   CO2 31  19 - 32 mEq/L  Glucose, Bld 120 (*) 70 - 99 mg/dL   BUN 11  6 - 23 mg/dL   Creatinine, Ser 1.61  0.50 - 1.10 mg/dL   Calcium 9.5  8.4 - 09.6 mg/dL   Total Protein 7.2  6.0 - 8.3 g/dL   Albumin 3.6  3.5 - 5.2 g/dL   AST 20  0 - 37 U/L   ALT 18  0 - 35 U/L   Alkaline Phosphatase 98  39 - 117 U/L   Total Bilirubin 0.3  0.3 - 1.2 mg/dL   GFR calc non Af Amer 71 (*) >90 mL/min   GFR calc Af Amer 82 (*) >90 mL/min  CBC WITH DIFFERENTIAL   Collection Time    01/05/12  3:16 PM      Result Value Range   WBC 7.6  4.0 - 10.5 K/uL   RBC 4.62  3.87 - 5.11 MIL/uL   Hemoglobin 14.1  12.0 - 15.0 g/dL   HCT 04.5  40.9 - 81.1 %   MCV 91.6  78.0 - 100.0 fL   MCH 30.5  26.0 - 34.0 pg   MCHC 33.3  30.0 - 36.0 g/dL   RDW 91.4  78.2 - 95.6 %   Platelets 143 (*) 150 - 400 K/uL   Neutrophils Relative 49  43 - 77 %   Neutro Abs 3.7  1.7 - 7.7 K/uL   Lymphocytes  Relative 38  12 - 46 %   Lymphs Abs 2.9  0.7 - 4.0 K/uL   Monocytes Relative 9  3 - 12 %   Monocytes Absolute 0.7  0.1 - 1.0 K/uL   Eosinophils Relative 4  0 - 5 %   Eosinophils Absolute 0.3  0.0 - 0.7 K/uL   Basophils Relative 0  0 - 1 %   Basophils Absolute 0.0  0.0 - 0.1 K/uL  URINALYSIS, ROUTINE W REFLEX MICROSCOPIC   Collection Time    01/05/12  4:07 PM      Result Value Range   Color, Urine AMBER (*) YELLOW   APPearance CLOUDY (*) CLEAR   Specific Gravity, Urine 1.037 (*) 1.005 - 1.030   pH 5.5  5.0 - 8.0   Glucose, UA NEGATIVE  NEGATIVE mg/dL   Hgb urine dipstick NEGATIVE  NEGATIVE   Bilirubin Urine MODERATE (*) NEGATIVE   Ketones, ur TRACE (*) NEGATIVE mg/dL   Protein, ur 30 (*) NEGATIVE mg/dL   Urobilinogen, UA 1.0  0.0 - 1.0 mg/dL   Nitrite NEGATIVE  NEGATIVE   Leukocytes, UA MODERATE (*) NEGATIVE  URINE RAPID DRUG SCREEN (HOSP PERFORMED)   Collection Time    01/05/12  4:07 PM      Result Value Range   Opiates NONE DETECTED  NONE DETECTED   Cocaine NONE DETECTED  NONE DETECTED   Benzodiazepines POSITIVE (*) NONE DETECTED   Amphetamines NONE DETECTED  NONE DETECTED   Tetrahydrocannabinol NONE DETECTED  NONE DETECTED   Barbiturates NONE DETECTED  NONE DETECTED  URINE MICROSCOPIC-ADD ON   Collection Time    01/05/12  4:07 PM      Result Value Range   Squamous Epithelial / LPF FEW (*) RARE   WBC, UA 11-20  <3 WBC/hpf   Crystals CA OXALATE CRYSTALS (*) NEGATIVE   Urine-Other MUCOUS PRESENT    Results for orders placed during the hospital encounter of 09/25/11 (from the past 8736 hour(s))  TSH   Collection Time    09/26/11  6:15 AM      Result  Value Range   TSH 4.808 (*) 0.350 - 4.500 uIU/mL  VALPROIC ACID LEVEL   Collection Time    09/26/11  6:15 AM      Result Value Range   Valproic Acid Lvl 71.4  50.0 - 100.0 ug/mL  Results for orders placed during the hospital encounter of 09/25/11 (from the past 8736 hour(s))  URINE RAPID DRUG SCREEN (HOSP PERFORMED)    Collection Time    09/25/11  2:42 PM      Result Value Range   Opiates NONE DETECTED  NONE DETECTED   Cocaine NONE DETECTED  NONE DETECTED   Benzodiazepines NONE DETECTED  NONE DETECTED   Amphetamines NONE DETECTED  NONE DETECTED   Tetrahydrocannabinol NONE DETECTED  NONE DETECTED   Barbiturates NONE DETECTED  NONE DETECTED  ETHANOL   Collection Time    09/25/11  2:49 PM      Result Value Range   Alcohol, Ethyl (B) <11  0 - 11 mg/dL  CBC   Collection Time    09/25/11  2:49 PM      Result Value Range   WBC 7.0  4.0 - 10.5 K/uL   RBC 4.53  3.87 - 5.11 MIL/uL   Hemoglobin 13.9  12.0 - 15.0 g/dL   HCT 96.0  45.4 - 09.8 %   MCV 92.3  78.0 - 100.0 fL   MCH 30.7  26.0 - 34.0 pg   MCHC 33.3  30.0 - 36.0 g/dL   RDW 11.9  14.7 - 82.9 %   Platelets 125 (*) 150 - 400 K/uL  COMPREHENSIVE METABOLIC PANEL   Collection Time    09/25/11  2:49 PM      Result Value Range   Sodium 141  135 - 145 mEq/L   Potassium 4.2  3.5 - 5.1 mEq/L   Chloride 101  96 - 112 mEq/L   CO2 29  19 - 32 mEq/L   Glucose, Bld 94  70 - 99 mg/dL   BUN 12  6 - 23 mg/dL   Creatinine, Ser 5.62  0.50 - 1.10 mg/dL   Calcium 13.0  8.4 - 86.5 mg/dL   Total Protein 7.2  6.0 - 8.3 g/dL   Albumin 3.8  3.5 - 5.2 g/dL   AST 15  0 - 37 U/L   ALT 12  0 - 35 U/L   Alkaline Phosphatase 100  39 - 117 U/L   Total Bilirubin 0.4  0.3 - 1.2 mg/dL   GFR calc non Af Amer >90  >90 mL/min   GFR calc Af Amer >90  >90 mL/min  Results for orders placed during the hospital encounter of 08/04/11 (from the past 8736 hour(s))  CBC   Collection Time    08/04/11  3:35 PM      Result Value Range   WBC 7.9  4.0 - 10.5 K/uL   RBC 4.20  3.87 - 5.11 MIL/uL   Hemoglobin 13.0  12.0 - 15.0 g/dL   HCT 78.4  69.6 - 29.5 %   MCV 90.5  78.0 - 100.0 fL   MCH 31.0  26.0 - 34.0 pg   MCHC 34.2  30.0 - 36.0 g/dL   RDW 28.4  13.2 - 44.0 %   Platelets 140 (*) 150 - 400 K/uL  DIFFERENTIAL   Collection Time    08/04/11  3:35 PM      Result Value Range    Neutrophils Relative 55  43 - 77 %   Neutro Abs 4.4  1.7 - 7.7 K/uL   Lymphocytes Relative 34  12 - 46 %   Lymphs Abs 2.7  0.7 - 4.0 K/uL   Monocytes Relative 8  3 - 12 %   Monocytes Absolute 0.6  0.1 - 1.0 K/uL   Eosinophils Relative 3  0 - 5 %   Eosinophils Absolute 0.3  0.0 - 0.7 K/uL   Basophils Relative 1  0 - 1 %   Basophils Absolute 0.0  0.0 - 0.1 K/uL  BASIC METABOLIC PANEL   Collection Time    08/04/11  3:35 PM      Result Value Range   Sodium 140  135 - 145 mEq/L   Potassium 3.6  3.5 - 5.1 mEq/L   Chloride 102  96 - 112 mEq/L   CO2 29  19 - 32 mEq/L   Glucose, Bld 92  70 - 99 mg/dL   BUN 11  6 - 23 mg/dL   Creatinine, Ser 1.61  0.50 - 1.10 mg/dL   Calcium 9.7  8.4 - 09.6 mg/dL   GFR calc non Af Amer >90  >90 mL/min   GFR calc Af Amer >90  >90 mL/min  TROPONIN I   Collection Time    08/04/11  3:35 PM      Result Value Range   Troponin I <0.30  <0.30 ng/mL   Assessment Axis I bipolar disorder, OCD, PTSD, record indicates schizophrenia, but that may be doubtful.  Axis II deferred  Axis III see medical history Axis IV mild to moderate  Axis V 55-60  Plan/Discussion: I took her vitals.  I reviewed CC, tobacco/med/surg Hx, meds effects/ side effects, problem list, therapies and responses as well as current situation/symptoms discussed options. Continue current effective medications. See orders and pt instructions for more details.  Medical Decision Making Problem Points:  Established problem, stable/improving (1), Review of last therapy session (1) and Review of psycho-social stressors (1) Data Points:  Review or order clinical lab tests (1) Review of medication regiment & side effects (2)  I certify that outpatient services furnished can reasonably be expected to improve the patient's condition.   Orson Aloe, MD, [facility]

## 2012-07-30 ENCOUNTER — Ambulatory Visit (HOSPITAL_COMMUNITY): Payer: Self-pay | Admitting: Psychiatry

## 2012-08-13 ENCOUNTER — Telehealth: Payer: Self-pay | Admitting: Physician Assistant

## 2012-08-13 NOTE — Telephone Encounter (Signed)
[facility] after speaking with Dr. Christell Constant.  She was placed on Augmentin.  Symptoms haven't resolved and wants to know if we want to add additional antibiotics.  Instructed patient that the antibiotics will take some time to work and to let us know if her symptoms worsen while on antibiotic or don't improve after a few more days.    Also concerned that her urine output is less than usual since starting Augmentin despite increasing fluid intake and wants to know if Augmentin can affect this.

## 2012-08-13 NOTE — Telephone Encounter (Signed)
Please call about a follow up appt patient is sick.

## 2012-08-15 ENCOUNTER — Ambulatory Visit (INDEPENDENT_AMBULATORY_CARE_PROVIDER_SITE_OTHER): Payer: Medicare Other | Admitting: Family Medicine

## 2012-08-15 ENCOUNTER — Encounter: Payer: Self-pay | Admitting: Family Medicine

## 2012-08-15 ENCOUNTER — Ambulatory Visit (INDEPENDENT_AMBULATORY_CARE_PROVIDER_SITE_OTHER): Payer: Medicare Other

## 2012-08-15 ENCOUNTER — Telehealth: Payer: Self-pay | Admitting: Nurse Practitioner

## 2012-08-15 VITALS — BP 152/86 | HR 97 | Temp 97.4°F | Ht 69.0 in | Wt 354.6 lb

## 2012-08-15 DIAGNOSIS — R0989 Other specified symptoms and signs involving the circulatory and respiratory systems: Secondary | ICD-10-CM

## 2012-08-15 DIAGNOSIS — R0609 Other forms of dyspnea: Secondary | ICD-10-CM

## 2012-08-15 DIAGNOSIS — I10 Essential (primary) hypertension: Secondary | ICD-10-CM

## 2012-08-15 DIAGNOSIS — G4733 Obstructive sleep apnea (adult) (pediatric): Secondary | ICD-10-CM

## 2012-08-15 DIAGNOSIS — I25810 Atherosclerosis of coronary artery bypass graft(s) without angina pectoris: Secondary | ICD-10-CM

## 2012-08-15 DIAGNOSIS — J45901 Unspecified asthma with (acute) exacerbation: Secondary | ICD-10-CM

## 2012-08-15 DIAGNOSIS — R0600 Dyspnea, unspecified: Secondary | ICD-10-CM

## 2012-08-15 MED ORDER — ALBUTEROL SULFATE HFA 108 (90 BASE) MCG/ACT IN AERS: 2.0000 | INHALATION_SPRAY | Freq: Four times a day (QID) | RESPIRATORY_TRACT | Status: DC | PRN

## 2012-08-15 MED ORDER — MONTELUKAST SODIUM 10 MG PO TABS: 10.0000 mg | ORAL_TABLET | Freq: Every day | ORAL | Status: DC

## 2012-08-15 MED ORDER — PREDNISONE 10 MG PO TABS: 10.0000 mg | ORAL_TABLET | Freq: Every day | ORAL | Status: DC

## 2012-08-15 NOTE — Telephone Encounter (Signed)
PT AWARE OF APPT 

## 2012-08-15 NOTE — Telephone Encounter (Signed)
WTBS. Tight in chest

## 2012-08-15 NOTE — Progress Notes (Signed)
Subjective:     Patient ID: Allison Sharp, female   DOB: 1955/12/11, 57 y.o.   MRN: [id number]  HPI   Review of Systems     Objective:   Physical Exam     Assessment:         Plan:         [facility] reading (PRIMARY) by  Dr. Leodis Sias: Chest x-ray: Preliminary reading: Hyperinflation no acute abnormality detected.

## 2012-08-15 NOTE — Progress Notes (Signed)
Subjective:     Patient ID: Allison Sharp, female   DOB: 12-11-1955, 57 y.o.   MRN: [id number]  Sinusitis Associated symptoms include coughing and shortness of breath.   Patient was seen last Saturday at a local urgent care clinic. Prescribed Augmentin. Not getting any better. Chest congestion chest tightness wheezing dyspnea and symptoms getting worse. Patient does history of obstructive sleep apnea. She has a history of asthma. She has a history of coronary artery disease. She denies having any anginal type chest pain. Definitely getting worse Past Medical History  Diagnosis Date  . Hyperlipidemia   . CAD (coronary artery disease)   . Bipolar 1 disorder   . Schizophrenia   . GERD (gastroesophageal reflux disease)   . Hyperthyroidism   . Chronic back pain   . Dyspnea     PFT 03/05/09 FEV1 2.77(98%), FVC 3.25(86%), FEV1% 85, TLC 5.88(99%), DLCO 60% ,  Methacholine challenge 03/16/09 normal ,  CT chest 03/12/09 no pulmonary disease  . Anxiety   . Arthritis   . Depression   . OSA on CPAP     2 liters  . HTN (hypertension)   . History of colonoscopy 10/17/2002    by Dr Rehman-> distal non-specific proctitis, small ext hemorrhoids,   . Fungal infection   . Cellulitis   . Contusion of sacrum   . Migraine headache   . Vitamin D deficiency   . Sleep apnea   . Myocardial infarction     NOV 1997  . Hypothyroidism     States she only has hyperthyroidism  . Complication of anesthesia     States she typically gets sick s/p anesthesia  . Morbid obesity with body mass index of 50.0-59.9 in adult JAN 2011 370 LBS    2004 311 BMI 45.9  . Obsessive-compulsive disorder   . PTSD (post-traumatic stress disorder)    Past Surgical History  Procedure Laterality Date  . Cholecystectomy    . Knee arthroscopy    . Appendectomy    . Tonsillectomy    . Back surgery    . Total vaginal hysterectomy    . Abdominal hysterectomy      sept 1996  . Tubal ligation    . Colonoscopy  10/17/2002      Distal proctitis, small external hemorrhoids, otherwise/  normal colonoscopy. Suspect rectal bleeding secondary to hemorrhoids  . Esophagogastroduodenoscopy  03/18/09    fundic gland polyps/mild gastritis  . Cardiac catheterization      nov 1997  . Joint replacement      bil knee replacement   History   Social History  . Marital Status: Single    Spouse Name: N/A    Number of Children: 2  . Years of Education: N/A   Occupational History  . Disabled     back problems   Social History Main Topics  . Smoking status: Never Smoker   . Smokeless tobacco: Not on file  . Alcohol Use: No  . Drug Use: No  . Sexually Active: No   Other Topics Concern  . Not on file   Social History Narrative  . No narrative on file   Family History  Problem Relation Age of Onset  . Cancer Mother   . Hypertension Mother   . Bipolar disorder Mother   . Dementia Mother   . Depression Mother   . Coronary artery disease Father   . Alcohol abuse Father   . Hypertension Brother   . Coronary artery disease Brother   .  Bipolar disorder Brother   . Depression Brother   . Anesthesia problems Neg Hx   . Hypotension Neg Hx   . Malignant hyperthermia Neg Hx   . Pseudochol deficiency Neg Hx   . Depression Sister   . Paranoid behavior Sister   . Bipolar disorder Sister   . Depression Sister    Current Outpatient Prescriptions on File Prior to Visit  Medication Sig Dispense Refill  . albuterol (VENTOLIN HFA) 108 (90 BASE) MCG/ACT inhaler Inhale 2 puffs into the lungs every 6 (six) hours as needed for wheezing or shortness of breath.  18 g  0  . aspirin 81 MG chewable tablet Chew 1 tablet (81 mg total) by mouth daily. For blood thinner.  30 tablet  0  . atorvastatin (LIPITOR) 40 MG tablet Take 1 tablet (40 mg total) by mouth at bedtime. For high cholesterol.  30 tablet  0  . Blood Pressure Monitoring (SPHYGMOMANOMETER) MISC 1 Device by Does not apply route once.  1 each  0  . buprenorphine (BUTRANS)  10 MCG/HR PTWK Place 1 patch (10 mcg total) onto the skin once a week. For chronic moderate to severe pain.  2 patch  0  . buPROPion (WELLBUTRIN SR) 150 MG 12 hr tablet Take 1 tablet (150 mg total) by mouth daily with breakfast.  30 tablet  2  . cholecalciferol 2000 UNITS TABS Take 1 tablet (2,000 Units total) by mouth daily with breakfast. Nutritional supplement.  30 tablet  0  . donepezil (ARICEPT) 10 MG tablet Take 1 tablet (10 mg total) by mouth daily with breakfast.  30 tablet  2  . DULoxetine (CYMBALTA) 60 MG capsule Take 1 capsule (60 mg total) by mouth 2 (two) times daily.  60 capsule  3  . feeding supplement (ENSURE COMPLETE) LIQD Take 237 mLs by mouth daily at 8 pm. As a nutritional supplement.  30 Bottle  0  . furosemide (LASIX) 20 MG tablet Take 1.5 tablets (30 mg total) by mouth daily. For blood pressure control.  45 tablet  0  . levothyroxine (SYNTHROID, LEVOTHROID) 125 MCG tablet Take 1 tablet (125 mcg total) by mouth daily before breakfast. For hypothyroidism.  30 tablet  0  . Menthol-Methyl Salicylate (GRX ANALGESIC BALM) OINT Apply 1 application topically 3 (three) times daily at 8am, 2pm and bedtime. For back pain.  410 g  0  . mirtazapine (REMERON) 7.5 MG tablet Take 1 tablet (7.5 mg total) by mouth at bedtime.  30 tablet  2  . Multiple Vitamin (MULTIVITAMIN WITH MINERALS) TABS Take 1 tablet by mouth daily. For nutritional supplementation.  30 tablet  0  . niacin (NIASPAN) 500 MG CR tablet Take 1 tablet (500 mg total) by mouth every evening. For elevated cholesterol.  30 tablet  0  . nystatin (MYCOSTATIN/NYSTOP) 100000 UNIT/GM POWD Apply 1 g topically 3 (three) times daily as needed (redness and moisture).  30 g  0  . polyethylene glycol (MIRALAX / GLYCOLAX) packet Take 17 g by mouth daily.      . verapamil (CALAN-SR) 180 MG CR tablet Take 1 tablet (180 mg total) by mouth daily. For blood pressure.  30 tablet  0  . dexlansoprazole (DEXILANT) 60 MG capsule 1 PO EVERY MORNING  31  capsule  11  . divalproex (DEPAKOTE ER) 500 MG 24 hr tablet Take 2 tablets (1,000 mg total) by mouth daily.  60 tablet  2   No current facility-administered medications on file prior to visit.  Allergies  Allergen Reactions  . Haldol (Haloperidol) Other (See Comments)    Hallucinating   . Ativan (Lorazepam) Other (See Comments)    Delirium  . Naproxen Nausea And Vomiting    There is no immunization history on file for this patient. Prior to Admission medications   Medication Sig Start Date End Date Taking? Authorizing Provider  albuterol (VENTOLIN HFA) 108 (90 BASE) MCG/ACT inhaler Inhale 2 puffs into the lungs every 6 (six) hours as needed for wheezing or shortness of breath. 01/19/12 01/18/13 Yes Curlene Labrum Readling, MD  aspirin 81 MG chewable tablet Chew 1 tablet (81 mg total) by mouth daily. For blood thinner. 01/19/12 01/18/13 Yes Curlene Labrum Readling, MD  atorvastatin (LIPITOR) 40 MG tablet Take 1 tablet (40 mg total) by mouth at bedtime. For high cholesterol. 01/19/12 01/18/13 Yes Curlene Labrum Readling, MD  Blood Pressure Monitoring (SPHYGMOMANOMETER) MISC 1 Device by Does not apply route once. 04/04/12  Yes Mike Craze, MD  buprenorphine (BUTRANS) 10 MCG/HR PTWK Place 1 patch (10 mcg total) onto the skin once a week. For chronic moderate to severe pain. 01/19/12  Yes Ronny Bacon, MD  buPROPion (WELLBUTRIN SR) 150 MG 12 hr tablet Take 1 tablet (150 mg total) by mouth daily with breakfast. 07/29/12  Yes Mike Craze, MD  cholecalciferol 2000 UNITS TABS Take 1 tablet (2,000 Units total) by mouth daily with breakfast. Nutritional supplement. 01/19/12 01/18/13 Yes Curlene Labrum Readling, MD  donepezil (ARICEPT) 10 MG tablet Take 1 tablet (10 mg total) by mouth daily with breakfast. 07/29/12  Yes Mike Craze, MD  DULoxetine (CYMBALTA) 60 MG capsule Take 1 capsule (60 mg total) by mouth 2 (two) times daily. 07/29/12  Yes Mike Craze, MD  feeding supplement (ENSURE COMPLETE) LIQD Take 237 mLs by mouth daily  at 8 pm. As a nutritional supplement. 01/19/12  Yes Curlene Labrum Readling, MD  furosemide (LASIX) 20 MG tablet Take 1.5 tablets (30 mg total) by mouth daily. For blood pressure control. 01/19/12 01/18/13 Yes Curlene Labrum Readling, MD  levothyroxine (SYNTHROID, LEVOTHROID) 125 MCG tablet Take 1 tablet (125 mcg total) by mouth daily before breakfast. For hypothyroidism. 01/19/12 01/18/13 Yes Curlene Labrum Readling, MD  Menthol-Methyl Salicylate (GRX ANALGESIC BALM) OINT Apply 1 application topically 3 (three) times daily at 8am, 2pm and bedtime. For back pain. 01/19/12  Yes Curlene Labrum Readling, MD  mirtazapine (REMERON) 7.5 MG tablet Take 1 tablet (7.5 mg total) by mouth at bedtime. 07/29/12  Yes Mike Craze, MD  Multiple Vitamin (MULTIVITAMIN WITH MINERALS) TABS Take 1 tablet by mouth daily. For nutritional supplementation. 01/19/12  Yes Curlene Labrum Readling, MD  niacin (NIASPAN) 500 MG CR tablet Take 1 tablet (500 mg total) by mouth every evening. For elevated cholesterol. 01/19/12 01/18/13 Yes Curlene Labrum Readling, MD  nystatin (MYCOSTATIN/NYSTOP) 100000 UNIT/GM POWD Apply 1 g topically 3 (three) times daily as needed (redness and moisture). 01/19/12  Yes Ronny Bacon, MD  omeprazole (PRILOSEC) 20 MG capsule  06/22/12  Yes Historical Provider, MD  polyethylene glycol (MIRALAX / GLYCOLAX) packet Take 17 g by mouth daily.   Yes Historical Provider, MD  verapamil (CALAN-SR) 180 MG CR tablet Take 1 tablet (180 mg total) by mouth daily. For blood pressure. 01/19/12 01/18/13 Yes Curlene Labrum Readling, MD  dexlansoprazole (DEXILANT) 60 MG capsule 1 PO EVERY MORNING 01/31/12 03/01/12  [place] Bali, MD  divalproex (DEPAKOTE ER) 500 MG 24 hr tablet Take 2 tablets (1,000 mg total) by mouth  daily. 07/29/12   Mike Craze, MD    Review of Systems  Constitutional: Positive for activity change and fatigue.  HENT: Positive for rhinorrhea.   Respiratory: Positive for cough, chest tightness, shortness of breath and wheezing.   Cardiovascular: Negative.    Gastrointestinal: Negative.   Endocrine: Negative.   Genitourinary: Negative.   Musculoskeletal: Negative.   Allergic/Immunologic: Negative.   Neurological: Negative.   Hematological: Negative.        Objective:   Physical Exam BP 152/86  Pulse 97  Temp(Src) 97.4 F (36.3 C) (Oral)  Ht 5\' 9"  (1.753 m)  Wt 354 lb 9.6 oz (160.846 kg)  BMI 52.34 kg/m2 Patient is morbidly obese. Chest paroxysmal coughing. The cough is characterized by wheezy coughVital signs as documented.  Skin warm and dry and without overt rashes. Head & Neck without JVD. Lungs : Good bilateral air entry equal. She has bilateral wheezes and rattling. Scattered crackles all over the lung fields. Talking precipitates paroxysms of cough. There is no retractions or use of accessory muscles. Heart exam notable for regular rhythm, normal sounds and absence of murmurs, rubs or gallops. Abdomen unremarkable and without evidence of organomegaly, masses, or abdominal aortic enlargement.  Breast exam: not performed. Gyn Exam: Not performed. External Genitalia: Vagina:: Cervix: Uterus: Adnexae: R/V: Extremities nonedematous.     Assessment:       Dyspnea and respiratory abnormality - Plan: DG Chest 2 View, Pulse oximetry (single), CANCELED: Pulse oximetry (single)  Chest congestion - Plan: DG Chest 2 View, Pulse oximetry (single), CANCELED: Pulse oximetry (single)  Obstructive sleep apnea  Asthma with acute exacerbation - Plan: Pulse oximetry (single), albuterol (VENTOLIN HFA) 108 (90 BASE) MCG/ACT inhaler, predniSONE (DELTASONE) 10 MG tablet, montelukast (SINGULAIR) 10 MG tablet, CANCELED: Pulse oximetry (single)  CAD (coronary artery disease) of artery bypass graft - Plan: EKG 12-Lead  Unspecified essential hypertension  Morbid obesity   Plan:         Orders Placed This Encounter  Procedures  . DG Chest 2 View    Order Specific Question:  Reason for Exam (SYMPTOM  OR DIAGNOSIS REQUIRED)    Answer:   Congestion and cough    Order Specific Question:  Is the patient pregnant?    Answer:  No    Order Specific Question:  Preferred imaging location?    Answer:  Internal  . EKG 12-Lead   Re: order for her albuterol inhaler was made. Also gave her a prednisone Dosepak. And started on Singulair 10 mg daily by mouth daily. Discussed with patient asthma she gets worse to the emergency room stat. Otherwise I like to follow her up in one week. Petros Ahart P. [place] Charon, M.D.

## 2012-08-26 ENCOUNTER — Ambulatory Visit: Payer: Medicare Other | Admitting: Family Medicine

## 2012-08-27 ENCOUNTER — Encounter (HOSPITAL_COMMUNITY): Payer: Self-pay | Admitting: Psychiatry

## 2012-08-27 ENCOUNTER — Ambulatory Visit (INDEPENDENT_AMBULATORY_CARE_PROVIDER_SITE_OTHER): Payer: No Typology Code available for payment source | Admitting: Psychiatry

## 2012-08-27 VITALS — Wt 356.0 lb

## 2012-08-27 DIAGNOSIS — E559 Vitamin D deficiency, unspecified: Secondary | ICD-10-CM

## 2012-08-27 DIAGNOSIS — F419 Anxiety disorder, unspecified: Secondary | ICD-10-CM

## 2012-08-27 DIAGNOSIS — F319 Bipolar disorder, unspecified: Secondary | ICD-10-CM

## 2012-08-27 DIAGNOSIS — F250 Schizoaffective disorder, bipolar type: Secondary | ICD-10-CM

## 2012-08-27 DIAGNOSIS — F5105 Insomnia due to other mental disorder: Secondary | ICD-10-CM

## 2012-08-27 DIAGNOSIS — G8929 Other chronic pain: Secondary | ICD-10-CM | POA: Insufficient documentation

## 2012-08-27 DIAGNOSIS — M549 Dorsalgia, unspecified: Secondary | ICD-10-CM | POA: Insufficient documentation

## 2012-08-27 DIAGNOSIS — R413 Other amnesia: Secondary | ICD-10-CM

## 2012-08-27 DIAGNOSIS — F4310 Post-traumatic stress disorder, unspecified: Secondary | ICD-10-CM

## 2012-08-27 DIAGNOSIS — F429 Obsessive-compulsive disorder, unspecified: Secondary | ICD-10-CM

## 2012-08-27 MED ORDER — DULOXETINE HCL 60 MG PO CPEP: 60.0000 mg | ORAL_CAPSULE | Freq: Two times a day (BID) | ORAL | Status: DC

## 2012-08-27 MED ORDER — BUPROPION HCL ER (SR) 150 MG PO TB12: 150.0000 mg | ORAL_TABLET | Freq: Every day | ORAL | Status: DC

## 2012-08-27 MED ORDER — DONEPEZIL HCL 10 MG PO TABS: 10.0000 mg | ORAL_TABLET | Freq: Every day | ORAL | Status: DC

## 2012-08-27 MED ORDER — DIVALPROEX SODIUM ER 500 MG PO TB24: ORAL_TABLET | ORAL | Status: DC

## 2012-08-27 MED ORDER — PRAZOSIN HCL 1 MG PO CAPS: ORAL_CAPSULE | ORAL | Status: DC

## 2012-08-27 MED ORDER — AMITRIPTYLINE HCL 25 MG PO TABS: 25.0000 mg | ORAL_TABLET | Freq: Every day | ORAL | Status: DC

## 2012-08-27 MED ORDER — GABAPENTIN 100 MG PO CAPS: ORAL_CAPSULE | ORAL | Status: DC

## 2012-08-27 NOTE — Patient Instructions (Signed)
Yoga is a very helpful exercise method.  On TV, on line, or by DVD Adelfa Koh is a source of high quality information about yoga and videos on yoga.  Renee Ramus is the world's number one video yoga instructor according to some experts.  There are exceptional health benefits that can be achieved through yoga.  The main principles of yoga is acceptance, no competition, no comparison, and no judgement.  It is exceptional in helping people meditate and get to a very relaxed state.   Positions that may be helpful for relaxing your back include monkey, plank and side plank.  Lying spinal twist is also very helpful.    Set a timer for 8 minutes and walk for that amount of time in the house or in the yard.  Mark "8" on a calendar for that day.  Do that every day this week.  Then next week increase the time to 9 minutes and then mark the calendar with a 9 for that day.  Each week increase your exercise by one minute.  Keep a record of this so you can see what progress you are making.  Do this every day, just like eating and sleeping.  It is good for pain control, depression, and for your soul/spirit.  Bring the record in for your next visit so we can talk about your effort and how you feel with the new exercise program going and working for you.  Strongly consider attending at least 6 [employer] Meetings to help you learn about how your helping others to the exclusion of helping yourself is actually hurting yourself and is actually an addiction to fixing others and that you need to work the 12 Step to Happiness through the [employer]. [employer] Family Groups could be helpful with how to deal with substance abusing family and friends. Or your own issues of being in victim role.  There are only 40 [employer] Family Group meetings a week here in [place].  Online are current listing of those meetings @ [email or web address]  There are [employer].  Search on line and there you can learn the format  and can access the schedule for yourself.  Their number is [phone]  Call if problems or concerns.

## 2012-08-27 NOTE — Progress Notes (Signed)
[facility] [id number] Progress Note BAUDELIA SCHROEPFER MRN: [id number] DOB: 07-Oct-1955 Age: 57 y.o.  Date: 08/27/2012 Start Time: 2:45 PM End Time: 3:25 PM  Chief Complaint: Chief Complaint  Patient presents with  . Depression  . Follow-up  . Medication Refill   Subjective: "Im so full of rage that I can't contain myself". Depression 10/10 and Anxiety 10/10, where 1 is the best and 10 is the worst.  Pain is 2 or 3/10 is primarily in her knees and back  History of presenting illness Patient is 57 year old Caucasian female who came for her follow up appointment.  Pt reports that she is compliant with the psychotropic medications with poor benefit and no noticeable side effects.  Pt has not been sleeping well recently.  She has stopped the BuTrans because it was ineffective.  She was thinking about harming someone yesterday.  She retreated to her room and turned on the CPAP and O2 and just layed in bed until today when she had this appointment.  She is not able to got o her friends house which was her primary retreat because that friend died in a fire 2 years ago.  She feels so trapped and unable to contain her emotions.    Current psychiatric medication Depakote 1000 mg at bedtime Cymbalta 60 mg twice a day Aricept 10 mg in AM Wellbutrin SR 150 mg daily Is off BuTrans as it was not effective  Past psychiatric history Patient has multiple psychiatric admission. She has at least 3 his suicidal attempt in the past. Her last admission was in August 2013.  In the past she had tried Paxil Prozac Wellbutrin Effexor Lexapro amitriptyline however she always had a good response with Cymbalta but it causes constipation, which is relieved by Miralax. She has been diagnosed with bipolar disorder  Medical history Patient has history of hyperlipidemia, GERD, chronic back pain, obesity, arthritis, hypertension, abstract and sleep apnea, vitamin D deficiency and history of myocardial infarction. Her  primary care physician is  Dr. Ileana Ladd in [facility].  Family history Patient has family member who has been diagnosed with bipolar disorder Family History family history includes Alcohol abuse in her father; Bipolar disorder in her brother, mother, and sister; Cancer in her mother; Coronary artery disease in her brother and father; Dementia in her mother; Depression in her brother, mother, and sisters; Hypertension in her brother and mother; and Paranoid behavior in her sister.  There is no history of Anesthesia problems, and Hypotension, and Malignant hyperthermia, and Pseudochol deficiency, .  Social history Patient currently living with her sister .  She has a plan to move into assisted living in the future.    Mental status examination Patient is casually dressed and fairly groomed.  She is obese female.  She maintained fair eye contact.  Her speech is fast but clear and coherent.  Her thought process is also fast but logical linear and goal-directed.  She appears today more agitated than her last visit.  She described her mood is good and her affect is bright.  He denies any active or passive suicidal thoughts or homicidal thoughts.  She denies any auditory or visual hallucination.  Her fund of knowledge is adequate.  There were no tremors or shakes.  She denies any delusion or paranoid thinking.  Her attention and concentration is the best as I have seen with her.  She's alert oriented x3.  Her insight judgment and impulse control is okay.  Lab Results:  Results for orders placed in visit on 06/12/12 (from the past 8736 hour(s))  VALPROIC ACID LEVEL   Collection Time    06/12/12  2:40 PM      Result Value Range   Valproic Acid Lvl 81.2  50.0 - 100.0 ug/mL  Results for orders placed during the hospital encounter of 01/09/12 (from the past 8736 hour(s))  URINALYSIS, ROUTINE W REFLEX MICROSCOPIC   Collection Time    01/12/12  6:39 PM      Result Value Range    Color, Urine YELLOW  YELLOW   APPearance TURBID (*) CLEAR   Specific Gravity, Urine 1.012  1.005 - 1.030   pH 6.5  5.0 - 8.0   Glucose, UA NEGATIVE  NEGATIVE mg/dL   Hgb urine dipstick MODERATE (*) NEGATIVE   Bilirubin Urine NEGATIVE  NEGATIVE   Ketones, ur NEGATIVE  NEGATIVE mg/dL   Protein, ur NEGATIVE  NEGATIVE mg/dL   Urobilinogen, UA 1.0  0.0 - 1.0 mg/dL   Nitrite NEGATIVE  NEGATIVE   Leukocytes, UA MODERATE (*) NEGATIVE  URINE MICROSCOPIC-ADD ON   Collection Time    01/12/12  6:39 PM      Result Value Range   Squamous Epithelial / LPF MANY (*) RARE   WBC, UA 0-2  <3 WBC/hpf   RBC / HPF 0-2  <3 RBC/hpf   Bacteria, UA FEW (*) RARE   Casts HYALINE CASTS (*) NEGATIVE   Urine-Other AMORPHOUS URATES/PHOSPHATES    VALPROIC ACID LEVEL   Collection Time    01/12/12  7:36 PM      Result Value Range   Valproic Acid Lvl 61.9  50.0 - 100.0 ug/mL  COMPREHENSIVE METABOLIC PANEL   Collection Time    01/12/12  7:36 PM      Result Value Range   Sodium 140  135 - 145 mEq/L   Potassium 3.9  3.5 - 5.1 mEq/L   Chloride 102  96 - 112 mEq/L   CO2 33 (*) 19 - 32 mEq/L   Glucose, Bld 122 (*) 70 - 99 mg/dL   BUN 9  6 - 23 mg/dL   Creatinine, Ser 8.29  0.50 - 1.10 mg/dL   Calcium 9.2  8.4 - 56.2 mg/dL   Total Protein 6.4  6.0 - 8.3 g/dL   Albumin 3.3 (*) 3.5 - 5.2 g/dL   AST 15  0 - 37 U/L   ALT 14  0 - 35 U/L   Alkaline Phosphatase 83  39 - 117 U/L   Total Bilirubin 0.3  0.3 - 1.2 mg/dL   GFR calc non Af Amer 66 (*) >90 mL/min   GFR calc Af Amer 77 (*) >90 mL/min  COMPREHENSIVE METABOLIC PANEL   Collection Time    01/17/12  7:37 PM      Result Value Range   Sodium 138  135 - 145 mEq/L   Potassium 4.0  3.5 - 5.1 mEq/L   Chloride 101  96 - 112 mEq/L   CO2 30  19 - 32 mEq/L   Glucose, Bld 115 (*) 70 - 99 mg/dL   BUN 9  6 - 23 mg/dL   Creatinine, Ser 1.30  0.50 - 1.10 mg/dL   Calcium 9.6  8.4 - 86.5 mg/dL   Total Protein 6.5  6.0 - 8.3 g/dL   Albumin 3.3 (*) 3.5 - 5.2 g/dL    AST 17  0 - 37 U/L   ALT 10  0 - 35 U/L  Alkaline Phosphatase 74  39 - 117 U/L   Total Bilirubin 0.4  0.3 - 1.2 mg/dL   GFR calc non Af Amer 75 (*) >90 mL/min   GFR calc Af Amer 86 (*) >90 mL/min  VALPROIC ACID LEVEL   Collection Time    01/17/12  7:37 PM      Result Value Range   Valproic Acid Lvl 54.9  50.0 - 100.0 ug/mL  URINALYSIS, ROUTINE W REFLEX MICROSCOPIC   Collection Time    01/17/12  9:25 PM      Result Value Range   Color, Urine AMBER (*) YELLOW   APPearance CLOUDY (*) CLEAR   Specific Gravity, Urine 1.027  1.005 - 1.030   pH 6.5  5.0 - 8.0   Glucose, UA NEGATIVE  NEGATIVE mg/dL   Hgb urine dipstick SMALL (*) NEGATIVE   Bilirubin Urine SMALL (*) NEGATIVE   Ketones, ur NEGATIVE  NEGATIVE mg/dL   Protein, ur NEGATIVE  NEGATIVE mg/dL   Urobilinogen, UA 1.0  0.0 - 1.0 mg/dL   Nitrite NEGATIVE  NEGATIVE   Leukocytes, UA LARGE (*) NEGATIVE  URINE MICROSCOPIC-ADD ON   Collection Time    01/17/12  9:25 PM      Result Value Range   Squamous Epithelial / LPF FEW (*) RARE   WBC, UA 11-20  <3 WBC/hpf   RBC / HPF 3-6  <3 RBC/hpf   Bacteria, UA MANY (*) RARE   Crystals CA OXALATE CRYSTALS (*) NEGATIVE   Urine-Other MUCOUS PRESENT    Results for orders placed during the hospital encounter of 01/07/12 (from the past 8736 hour(s))  URINE CULTURE   Collection Time    01/07/12  3:36 PM      Result Value Range   Specimen Description URINE, CLEAN CATCH     Special Requests NONE     Culture  Setup Time 01/08/2012 03:15     Colony Count 20,OOO COLONIES/ML     Culture KLEBSIELLA PNEUMONIAE     Report Status 01/10/2012 FINAL     Organism ID, Bacteria KLEBSIELLA PNEUMONIAE    URINALYSIS, ROUTINE W REFLEX MICROSCOPIC   Collection Time    01/07/12  3:36 PM      Result Value Range   Color, Urine YELLOW  YELLOW   APPearance CLOUDY (*) CLEAR   Specific Gravity, Urine 1.016  1.005 - 1.030   pH 7.5  5.0 - 8.0   Glucose, UA NEGATIVE  NEGATIVE mg/dL   Hgb urine dipstick NEGATIVE   NEGATIVE   Bilirubin Urine NEGATIVE  NEGATIVE   Ketones, ur NEGATIVE  NEGATIVE mg/dL   Protein, ur NEGATIVE  NEGATIVE mg/dL   Urobilinogen, UA 2.0 (*) 0.0 - 1.0 mg/dL   Nitrite NEGATIVE  NEGATIVE   Leukocytes, UA MODERATE (*) NEGATIVE  URINE MICROSCOPIC-ADD ON   Collection Time    01/07/12  3:36 PM      Result Value Range   Squamous Epithelial / LPF RARE  RARE   WBC, UA 11-20  <3 WBC/hpf   Bacteria, UA MANY (*) RARE  URINE RAPID DRUG SCREEN (HOSP PERFORMED)   Collection Time    01/07/12  3:37 PM      Result Value Range   Opiates NONE DETECTED  NONE DETECTED   Cocaine NONE DETECTED  NONE DETECTED   Benzodiazepines POSITIVE (*) NONE DETECTED   Amphetamines NONE DETECTED  NONE DETECTED   Tetrahydrocannabinol NONE DETECTED  NONE DETECTED   Barbiturates NONE DETECTED  NONE DETECTED  CBC WITH  DIFFERENTIAL   Collection Time    01/07/12  4:19 PM      Result Value Range   WBC 8.5  4.0 - 10.5 K/uL   RBC 4.60  3.87 - 5.11 MIL/uL   Hemoglobin 13.9  12.0 - 15.0 g/dL   HCT 40.9  81.1 - 91.4 %   MCV 91.1  78.0 - 100.0 fL   MCH 30.2  26.0 - 34.0 pg   MCHC 33.2  30.0 - 36.0 g/dL   RDW 78.2  95.6 - 21.3 %   Platelets 137 (*) 150 - 400 K/uL   Neutrophils Relative 56  43 - 77 %   Neutro Abs 4.7  1.7 - 7.7 K/uL   Lymphocytes Relative 29  12 - 46 %   Lymphs Abs 2.5  0.7 - 4.0 K/uL   Monocytes Relative 11  3 - 12 %   Monocytes Absolute 1.0  0.1 - 1.0 K/uL   Eosinophils Relative 3  0 - 5 %   Eosinophils Absolute 0.3  0.0 - 0.7 K/uL   Basophils Relative 1  0 - 1 %   Basophils Absolute 0.1  0.0 - 0.1 K/uL  COMPREHENSIVE METABOLIC PANEL   Collection Time    01/07/12  4:19 PM      Result Value Range   Sodium 138  135 - 145 mEq/L   Potassium 3.8  3.5 - 5.1 mEq/L   Chloride 99  96 - 112 mEq/L   CO2 32  19 - 32 mEq/L   Glucose, Bld 108 (*) 70 - 99 mg/dL   BUN 8  6 - 23 mg/dL   Creatinine, Ser 0.86  0.50 - 1.10 mg/dL   Calcium 9.6  8.4 - 57.8 mg/dL   Total Protein 7.1  6.0 - 8.3 g/dL    Albumin 3.6  3.5 - 5.2 g/dL   AST 38 (*) 0 - 37 U/L   ALT 39 (*) 0 - 35 U/L   Alkaline Phosphatase 120 (*) 39 - 117 U/L   Total Bilirubin 0.4  0.3 - 1.2 mg/dL   GFR calc non Af Amer 78 (*) >90 mL/min   GFR calc Af Amer >90  >90 mL/min  ETHANOL   Collection Time    01/07/12  4:19 PM      Result Value Range   Alcohol, Ethyl (B) <11  0 - 11 mg/dL  ACETAMINOPHEN LEVEL   Collection Time    01/07/12  4:19 PM      Result Value Range   Acetaminophen (Tylenol), Serum <15.0  10 - 30 ug/mL  VALPROIC ACID LEVEL   Collection Time    01/07/12  7:04 PM      Result Value Range   Valproic Acid Lvl 27.1 (*) 50.0 - 100.0 ug/mL  T3   Collection Time    01/07/12 11:44 PM      Result Value Range   T3, Total 91.9  80.0 - 204.0 ng/dl  T4   Collection Time    01/07/12 11:44 PM      Result Value Range   T4, Total 6.7  5.0 - 12.5 ug/dL  TSH   Collection Time    01/07/12 11:44 PM      Result Value Range   TSH 3.536  0.350 - 4.500 uIU/mL  Results for orders placed during the hospital encounter of 01/05/12 (from the past 8736 hour(s))  COMPREHENSIVE METABOLIC PANEL   Collection Time    01/05/12  3:16 PM  Result Value Range   Sodium 138  135 - 145 mEq/L   Potassium 4.2  3.5 - 5.1 mEq/L   Chloride 100  96 - 112 mEq/L   CO2 31  19 - 32 mEq/L   Glucose, Bld 120 (*) 70 - 99 mg/dL   BUN 11  6 - 23 mg/dL   Creatinine, Ser 4.09  0.50 - 1.10 mg/dL   Calcium 9.5  8.4 - 81.1 mg/dL   Total Protein 7.2  6.0 - 8.3 g/dL   Albumin 3.6  3.5 - 5.2 g/dL   AST 20  0 - 37 U/L   ALT 18  0 - 35 U/L   Alkaline Phosphatase 98  39 - 117 U/L   Total Bilirubin 0.3  0.3 - 1.2 mg/dL   GFR calc non Af Amer 71 (*) >90 mL/min   GFR calc Af Amer 82 (*) >90 mL/min  CBC WITH DIFFERENTIAL   Collection Time    01/05/12  3:16 PM      Result Value Range   WBC 7.6  4.0 - 10.5 K/uL   RBC 4.62  3.87 - 5.11 MIL/uL   Hemoglobin 14.1  12.0 - 15.0 g/dL   HCT 91.4  78.2 - 95.6 %   MCV 91.6  78.0 - 100.0 fL   MCH 30.5  26.0  - 34.0 pg   MCHC 33.3  30.0 - 36.0 g/dL   RDW 21.3  08.6 - 57.8 %   Platelets 143 (*) 150 - 400 K/uL   Neutrophils Relative 49  43 - 77 %   Neutro Abs 3.7  1.7 - 7.7 K/uL   Lymphocytes Relative 38  12 - 46 %   Lymphs Abs 2.9  0.7 - 4.0 K/uL   Monocytes Relative 9  3 - 12 %   Monocytes Absolute 0.7  0.1 - 1.0 K/uL   Eosinophils Relative 4  0 - 5 %   Eosinophils Absolute 0.3  0.0 - 0.7 K/uL   Basophils Relative 0  0 - 1 %   Basophils Absolute 0.0  0.0 - 0.1 K/uL  URINALYSIS, ROUTINE W REFLEX MICROSCOPIC   Collection Time    01/05/12  4:07 PM      Result Value Range   Color, Urine AMBER (*) YELLOW   APPearance CLOUDY (*) CLEAR   Specific Gravity, Urine 1.037 (*) 1.005 - 1.030   pH 5.5  5.0 - 8.0   Glucose, UA NEGATIVE  NEGATIVE mg/dL   Hgb urine dipstick NEGATIVE  NEGATIVE   Bilirubin Urine MODERATE (*) NEGATIVE   Ketones, ur TRACE (*) NEGATIVE mg/dL   Protein, ur 30 (*) NEGATIVE mg/dL   Urobilinogen, UA 1.0  0.0 - 1.0 mg/dL   Nitrite NEGATIVE  NEGATIVE   Leukocytes, UA MODERATE (*) NEGATIVE  URINE RAPID DRUG SCREEN (HOSP PERFORMED)   Collection Time    01/05/12  4:07 PM      Result Value Range   Opiates NONE DETECTED  NONE DETECTED   Cocaine NONE DETECTED  NONE DETECTED   Benzodiazepines POSITIVE (*) NONE DETECTED   Amphetamines NONE DETECTED  NONE DETECTED   Tetrahydrocannabinol NONE DETECTED  NONE DETECTED   Barbiturates NONE DETECTED  NONE DETECTED  URINE MICROSCOPIC-ADD ON   Collection Time    01/05/12  4:07 PM      Result Value Range   Squamous Epithelial / LPF FEW (*) RARE   WBC, UA 11-20  <3 WBC/hpf   Crystals CA OXALATE CRYSTALS (*) NEGATIVE  Urine-Other MUCOUS PRESENT    Results for orders placed during the hospital encounter of 09/25/11 (from the past 8736 hour(s))  TSH   Collection Time    09/26/11  6:15 AM      Result Value Range   TSH 4.808 (*) 0.350 - 4.500 uIU/mL  VALPROIC ACID LEVEL   Collection Time    09/26/11  6:15 AM      Result Value Range    Valproic Acid Lvl 71.4  50.0 - 100.0 ug/mL  Results for orders placed during the hospital encounter of 09/25/11 (from the past 8736 hour(s))  URINE RAPID DRUG SCREEN (HOSP PERFORMED)   Collection Time    09/25/11  2:42 PM      Result Value Range   Opiates NONE DETECTED  NONE DETECTED   Cocaine NONE DETECTED  NONE DETECTED   Benzodiazepines NONE DETECTED  NONE DETECTED   Amphetamines NONE DETECTED  NONE DETECTED   Tetrahydrocannabinol NONE DETECTED  NONE DETECTED   Barbiturates NONE DETECTED  NONE DETECTED  ETHANOL   Collection Time    09/25/11  2:49 PM      Result Value Range   Alcohol, Ethyl (B) <11  0 - 11 mg/dL  CBC   Collection Time    09/25/11  2:49 PM      Result Value Range   WBC 7.0  4.0 - 10.5 K/uL   RBC 4.53  3.87 - 5.11 MIL/uL   Hemoglobin 13.9  12.0 - 15.0 g/dL   HCT 16.1  09.6 - 04.5 %   MCV 92.3  78.0 - 100.0 fL   MCH 30.7  26.0 - 34.0 pg   MCHC 33.3  30.0 - 36.0 g/dL   RDW 40.9  81.1 - 91.4 %   Platelets 125 (*) 150 - 400 K/uL  COMPREHENSIVE METABOLIC PANEL   Collection Time    09/25/11  2:49 PM      Result Value Range   Sodium 141  135 - 145 mEq/L   Potassium 4.2  3.5 - 5.1 mEq/L   Chloride 101  96 - 112 mEq/L   CO2 29  19 - 32 mEq/L   Glucose, Bld 94  70 - 99 mg/dL   BUN 12  6 - 23 mg/dL   Creatinine, Ser 7.82  0.50 - 1.10 mg/dL   Calcium 95.6  8.4 - 21.3 mg/dL   Total Protein 7.2  6.0 - 8.3 g/dL   Albumin 3.8  3.5 - 5.2 g/dL   AST 15  0 - 37 U/L   ALT 12  0 - 35 U/L   Alkaline Phosphatase 100  39 - 117 U/L   Total Bilirubin 0.4  0.3 - 1.2 mg/dL   GFR calc non Af Amer >90  >90 mL/min   GFR calc Af Amer >90  >90 mL/min   Assessment Axis I bipolar disorder, OCD, PTSD, record indicates schizophrenia, but that may be doubtful.  Axis II deferred  Axis III see medical history Axis IV mild to moderate  Axis V 55-60  Plan/Discussion: I took her vitals.  I reviewed CC, tobacco/med/surg Hx, meds effects/ side effects, problem list, therapies and  responses as well as current situation/symptoms discussed options. Increase Depakote and add Elavil and Minipress for sleep. See orders and pt instructions for more details.  MEDICATIONS this encounter: Meds ordered this encounter  Medications  . DULoxetine (CYMBALTA) 60 MG capsule    Sig: Take 1 capsule (60 mg total) by mouth 2 (two) times daily.  Dispense:  60 capsule    Refill:  3  . donepezil (ARICEPT) 10 MG tablet    Sig: Take 1 tablet (10 mg total) by mouth daily with breakfast.    Dispense:  30 tablet    Refill:  2  . divalproex (DEPAKOTE ER) 500 MG 24 hr tablet    Sig: Take by mouth 3 at night. On occasions may take one during the day for extreme stress.    Dispense:  100 tablet    Refill:  2  . buPROPion (WELLBUTRIN SR) 150 MG 12 hr tablet    Sig: Take 1 tablet (150 mg total) by mouth daily with breakfast.    Dispense:  30 tablet    Refill:  2  . gabapentin (NEURONTIN) 100 MG capsule    Sig: Take by mouth start with 1 three times a day, may take 2 at bed for sleep, then can increase to 2 three times a day and 2-4 at night.    Dispense:  130 capsule    Refill:  2  . amitriptyline (ELAVIL) 25 MG tablet    Sig: Take 1 tablet (25 mg total) by mouth at bedtime.    Dispense:  30 tablet    Refill:  1  . prazosin (MINIPRESS) 1 MG capsule    Sig: Take by mouth one at night, may repeat in 1 hour, the next night may try 2 and repeat with ONE in 1 hour, then the next night may try THREE and may repeat with ONE cap.  May take up to 5 at night.  Find the dose that works the best. CALL with the best dose, because you will run out and I need to prescribe the apporpriate stronger dose.    Dispense:  150 capsule    Refill:  1    Medical Decision Making Problem Points:  Established problem, stable/improving (1), Review of last therapy session (1) and Review of psycho-social stressors (1) Data Points:  Review or order clinical lab tests (1) Review of medication regiment & side effects  (2)  I certify that outpatient services furnished can reasonably be expected to improve the patient's condition.   Orson Aloe, MD, [facility]

## 2012-08-29 ENCOUNTER — Ambulatory Visit (INDEPENDENT_AMBULATORY_CARE_PROVIDER_SITE_OTHER): Payer: No Typology Code available for payment source | Admitting: Psychiatry

## 2012-08-29 ENCOUNTER — Encounter (HOSPITAL_COMMUNITY): Payer: Self-pay | Admitting: Psychiatry

## 2012-08-29 VITALS — Wt 351.0 lb

## 2012-08-29 DIAGNOSIS — F4310 Post-traumatic stress disorder, unspecified: Secondary | ICD-10-CM

## 2012-08-29 DIAGNOSIS — F319 Bipolar disorder, unspecified: Secondary | ICD-10-CM

## 2012-08-29 DIAGNOSIS — F5105 Insomnia due to other mental disorder: Secondary | ICD-10-CM

## 2012-08-29 DIAGNOSIS — F429 Obsessive-compulsive disorder, unspecified: Secondary | ICD-10-CM

## 2012-08-29 MED ORDER — PRAZOSIN HCL 1 MG PO CAPS: 3.0000 mg | ORAL_CAPSULE | Freq: Every day | ORAL | Status: DC

## 2012-08-29 NOTE — Progress Notes (Signed)
[facility] [id number] Progress Note Allison Sharp MRN: [id number] DOB: Sep 20, 1955 Age: 57 y.o.  Date: 08/29/2012 Start Time: 1:22 PM End Time: 1:45 PM  Chief Complaint: Chief Complaint  Patient presents with  . Anxiety  . Depression  . Follow-up  . Medication Refill   Subjective: "Im so glad to be at 351, 350 was my first goal". Depression 3/10 and Anxiety 0/10, where 1 is the best and 10 is the worst.  Pain is 0/10   History of presenting illness Patient is 57 year old Caucasian female who came for her follow up appointment.  Pt reports that she is compliant with the psychotropic medications with good benefit and no noticeable side effects.  Her sleeping has improved with Elavil 25 and Minipress 3 mg.  Her anger and rage have subsided with the increase in Depakote.  Current psychiatric medication Depakote 1500 mg at bedtime and 500 during the day for a couple of day Cymbalta 60 mg twice a day Aricept 10 mg in AM Wellbutrin SR 150 mg daily Neurontin 100 mg two times a day Elavil 25 mg at bed time Minipress 2 or 3 mg at bed time  Past psychiatric history Patient has multiple psychiatric admission. She has at least 3 his suicidal attempt in the past. Her last admission was in August 2013.  In the past she had tried Paxil Prozac Wellbutrin Effexor Lexapro amitriptyline however she always had a good response with Cymbalta but it causes constipation, which is relieved by Miralax. She has been diagnosed with bipolar disorder  Medical history Patient has history of hyperlipidemia, GERD, chronic back pain, obesity, arthritis, hypertension, abstract and sleep apnea, vitamin D deficiency and history of myocardial infarction. Her primary care physician is  Dr. Ileana Ladd in [facility].  Family history Patient has family member who has been diagnosed with bipolar disorder Family History family history includes Alcohol abuse in her father; Bipolar disorder  in her brother, mother, and sister; Cancer in her mother; Coronary artery disease in her brother and father; Dementia in her mother; Depression in her brother, mother, and sisters; Hypertension in her brother and mother; and Paranoid behavior in her sister.  There is no history of Anesthesia problems, and Hypotension, and Malignant hyperthermia, and Pseudochol deficiency, .  Social history Patient currently living with her sister .  She has a plan to move into assisted living in the future.    Mental status examination Patient is casually dressed and fairly groomed.  She is obese female.  She maintained fair eye contact.  Her speech is fast but clear and coherent.  Her thought process is also fast but logical linear and goal-directed.  She appears today more agitated than her last visit.  She described her mood is good and her affect is bright.  He denies any active or passive suicidal thoughts or homicidal thoughts.  She denies any auditory or visual hallucination.  Her fund of knowledge is adequate.  There were no tremors or shakes.  She denies any delusion or paranoid thinking.  Her attention and concentration is the best as I have seen with her.  She's alert oriented x3.  Her insight judgment and impulse control is okay.    Lab Results:  Results for orders placed in visit on 06/12/12 (from the past 8736 hour(s))  VALPROIC ACID LEVEL   Collection Time    06/12/12  2:40 PM      Result Value Range   Valproic Acid Lvl 81.2  50.0 - 100.0 ug/mL  Results for orders placed during the hospital encounter of 01/09/12 (from the past 8736 hour(s))  URINALYSIS, ROUTINE W REFLEX MICROSCOPIC   Collection Time    01/12/12  6:39 PM      Result Value Range   Color, Urine YELLOW  YELLOW   APPearance TURBID (*) CLEAR   Specific Gravity, Urine 1.012  1.005 - 1.030   pH 6.5  5.0 - 8.0   Glucose, UA NEGATIVE  NEGATIVE mg/dL   Hgb urine dipstick MODERATE (*) NEGATIVE   Bilirubin Urine NEGATIVE  NEGATIVE    Ketones, ur NEGATIVE  NEGATIVE mg/dL   Protein, ur NEGATIVE  NEGATIVE mg/dL   Urobilinogen, UA 1.0  0.0 - 1.0 mg/dL   Nitrite NEGATIVE  NEGATIVE   Leukocytes, UA MODERATE (*) NEGATIVE  URINE MICROSCOPIC-ADD ON   Collection Time    01/12/12  6:39 PM      Result Value Range   Squamous Epithelial / LPF MANY (*) RARE   WBC, UA 0-2  <3 WBC/hpf   RBC / HPF 0-2  <3 RBC/hpf   Bacteria, UA FEW (*) RARE   Casts HYALINE CASTS (*) NEGATIVE   Urine-Other AMORPHOUS URATES/PHOSPHATES    VALPROIC ACID LEVEL   Collection Time    01/12/12  7:36 PM      Result Value Range   Valproic Acid Lvl 61.9  50.0 - 100.0 ug/mL  COMPREHENSIVE METABOLIC PANEL   Collection Time    01/12/12  7:36 PM      Result Value Range   Sodium 140  135 - 145 mEq/L   Potassium 3.9  3.5 - 5.1 mEq/L   Chloride 102  96 - 112 mEq/L   CO2 33 (*) 19 - 32 mEq/L   Glucose, Bld 122 (*) 70 - 99 mg/dL   BUN 9  6 - 23 mg/dL   Creatinine, Ser 1.61  0.50 - 1.10 mg/dL   Calcium 9.2  8.4 - 09.6 mg/dL   Total Protein 6.4  6.0 - 8.3 g/dL   Albumin 3.3 (*) 3.5 - 5.2 g/dL   AST 15  0 - 37 U/L   ALT 14  0 - 35 U/L   Alkaline Phosphatase 83  39 - 117 U/L   Total Bilirubin 0.3  0.3 - 1.2 mg/dL   GFR calc non Af Amer 66 (*) >90 mL/min   GFR calc Af Amer 77 (*) >90 mL/min  COMPREHENSIVE METABOLIC PANEL   Collection Time    01/17/12  7:37 PM      Result Value Range   Sodium 138  135 - 145 mEq/L   Potassium 4.0  3.5 - 5.1 mEq/L   Chloride 101  96 - 112 mEq/L   CO2 30  19 - 32 mEq/L   Glucose, Bld 115 (*) 70 - 99 mg/dL   BUN 9  6 - 23 mg/dL   Creatinine, Ser 0.45  0.50 - 1.10 mg/dL   Calcium 9.6  8.4 - 40.9 mg/dL   Total Protein 6.5  6.0 - 8.3 g/dL   Albumin 3.3 (*) 3.5 - 5.2 g/dL   AST 17  0 - 37 U/L   ALT 10  0 - 35 U/L   Alkaline Phosphatase 74  39 - 117 U/L   Total Bilirubin 0.4  0.3 - 1.2 mg/dL   GFR calc non Af Amer 75 (*) >90 mL/min   GFR calc Af Amer 86 (*) >90 mL/min  VALPROIC ACID LEVEL   Collection  Time    01/17/12   7:37 PM      Result Value Range   Valproic Acid Lvl 54.9  50.0 - 100.0 ug/mL  URINALYSIS, ROUTINE W REFLEX MICROSCOPIC   Collection Time    01/17/12  9:25 PM      Result Value Range   Color, Urine AMBER (*) YELLOW   APPearance CLOUDY (*) CLEAR   Specific Gravity, Urine 1.027  1.005 - 1.030   pH 6.5  5.0 - 8.0   Glucose, UA NEGATIVE  NEGATIVE mg/dL   Hgb urine dipstick SMALL (*) NEGATIVE   Bilirubin Urine SMALL (*) NEGATIVE   Ketones, ur NEGATIVE  NEGATIVE mg/dL   Protein, ur NEGATIVE  NEGATIVE mg/dL   Urobilinogen, UA 1.0  0.0 - 1.0 mg/dL   Nitrite NEGATIVE  NEGATIVE   Leukocytes, UA LARGE (*) NEGATIVE  URINE MICROSCOPIC-ADD ON   Collection Time    01/17/12  9:25 PM      Result Value Range   Squamous Epithelial / LPF FEW (*) RARE   WBC, UA 11-20  <3 WBC/hpf   RBC / HPF 3-6  <3 RBC/hpf   Bacteria, UA MANY (*) RARE   Crystals CA OXALATE CRYSTALS (*) NEGATIVE   Urine-Other MUCOUS PRESENT    Results for orders placed during the hospital encounter of 01/07/12 (from the past 8736 hour(s))  URINE CULTURE   Collection Time    01/07/12  3:36 PM      Result Value Range   Specimen Description URINE, CLEAN CATCH     Special Requests NONE     Culture  Setup Time 01/08/2012 03:15     Colony Count 20,OOO COLONIES/ML     Culture KLEBSIELLA PNEUMONIAE     Report Status 01/10/2012 FINAL     Organism ID, Bacteria KLEBSIELLA PNEUMONIAE    URINALYSIS, ROUTINE W REFLEX MICROSCOPIC   Collection Time    01/07/12  3:36 PM      Result Value Range   Color, Urine YELLOW  YELLOW   APPearance CLOUDY (*) CLEAR   Specific Gravity, Urine 1.016  1.005 - 1.030   pH 7.5  5.0 - 8.0   Glucose, UA NEGATIVE  NEGATIVE mg/dL   Hgb urine dipstick NEGATIVE  NEGATIVE   Bilirubin Urine NEGATIVE  NEGATIVE   Ketones, ur NEGATIVE  NEGATIVE mg/dL   Protein, ur NEGATIVE  NEGATIVE mg/dL   Urobilinogen, UA 2.0 (*) 0.0 - 1.0 mg/dL   Nitrite NEGATIVE  NEGATIVE   Leukocytes, UA MODERATE (*) NEGATIVE  URINE  MICROSCOPIC-ADD ON   Collection Time    01/07/12  3:36 PM      Result Value Range   Squamous Epithelial / LPF RARE  RARE   WBC, UA 11-20  <3 WBC/hpf   Bacteria, UA MANY (*) RARE  URINE RAPID DRUG SCREEN (HOSP PERFORMED)   Collection Time    01/07/12  3:37 PM      Result Value Range   Opiates NONE DETECTED  NONE DETECTED   Cocaine NONE DETECTED  NONE DETECTED   Benzodiazepines POSITIVE (*) NONE DETECTED   Amphetamines NONE DETECTED  NONE DETECTED   Tetrahydrocannabinol NONE DETECTED  NONE DETECTED   Barbiturates NONE DETECTED  NONE DETECTED  CBC WITH DIFFERENTIAL   Collection Time    01/07/12  4:19 PM      Result Value Range   WBC 8.5  4.0 - 10.5 K/uL   RBC 4.60  3.87 - 5.11 MIL/uL   Hemoglobin 13.9  12.0 -  15.0 g/dL   HCT 16.1  09.6 - 04.5 %   MCV 91.1  78.0 - 100.0 fL   MCH 30.2  26.0 - 34.0 pg   MCHC 33.2  30.0 - 36.0 g/dL   RDW 40.9  81.1 - 91.4 %   Platelets 137 (*) 150 - 400 K/uL   Neutrophils Relative 56  43 - 77 %   Neutro Abs 4.7  1.7 - 7.7 K/uL   Lymphocytes Relative 29  12 - 46 %   Lymphs Abs 2.5  0.7 - 4.0 K/uL   Monocytes Relative 11  3 - 12 %   Monocytes Absolute 1.0  0.1 - 1.0 K/uL   Eosinophils Relative 3  0 - 5 %   Eosinophils Absolute 0.3  0.0 - 0.7 K/uL   Basophils Relative 1  0 - 1 %   Basophils Absolute 0.1  0.0 - 0.1 K/uL  COMPREHENSIVE METABOLIC PANEL   Collection Time    01/07/12  4:19 PM      Result Value Range   Sodium 138  135 - 145 mEq/L   Potassium 3.8  3.5 - 5.1 mEq/L   Chloride 99  96 - 112 mEq/L   CO2 32  19 - 32 mEq/L   Glucose, Bld 108 (*) 70 - 99 mg/dL   BUN 8  6 - 23 mg/dL   Creatinine, Ser 7.82  0.50 - 1.10 mg/dL   Calcium 9.6  8.4 - 95.6 mg/dL   Total Protein 7.1  6.0 - 8.3 g/dL   Albumin 3.6  3.5 - 5.2 g/dL   AST 38 (*) 0 - 37 U/L   ALT 39 (*) 0 - 35 U/L   Alkaline Phosphatase 120 (*) 39 - 117 U/L   Total Bilirubin 0.4  0.3 - 1.2 mg/dL   GFR calc non Af Amer 78 (*) >90 mL/min   GFR calc Af Amer >90  >90 mL/min  ETHANOL    Collection Time    01/07/12  4:19 PM      Result Value Range   Alcohol, Ethyl (B) <11  0 - 11 mg/dL  ACETAMINOPHEN LEVEL   Collection Time    01/07/12  4:19 PM      Result Value Range   Acetaminophen (Tylenol), Serum <15.0  10 - 30 ug/mL  VALPROIC ACID LEVEL   Collection Time    01/07/12  7:04 PM      Result Value Range   Valproic Acid Lvl 27.1 (*) 50.0 - 100.0 ug/mL  T3   Collection Time    01/07/12 11:44 PM      Result Value Range   T3, Total 91.9  80.0 - 204.0 ng/dl  T4   Collection Time    01/07/12 11:44 PM      Result Value Range   T4, Total 6.7  5.0 - 12.5 ug/dL  TSH   Collection Time    01/07/12 11:44 PM      Result Value Range   TSH 3.536  0.350 - 4.500 uIU/mL  Results for orders placed during the hospital encounter of 01/05/12 (from the past 8736 hour(s))  COMPREHENSIVE METABOLIC PANEL   Collection Time    01/05/12  3:16 PM      Result Value Range   Sodium 138  135 - 145 mEq/L   Potassium 4.2  3.5 - 5.1 mEq/L   Chloride 100  96 - 112 mEq/L   CO2 31  19 - 32 mEq/L   Glucose, Bld 120 (*)  70 - 99 mg/dL   BUN 11  6 - 23 mg/dL   Creatinine, Ser 4.09  0.50 - 1.10 mg/dL   Calcium 9.5  8.4 - 81.1 mg/dL   Total Protein 7.2  6.0 - 8.3 g/dL   Albumin 3.6  3.5 - 5.2 g/dL   AST 20  0 - 37 U/L   ALT 18  0 - 35 U/L   Alkaline Phosphatase 98  39 - 117 U/L   Total Bilirubin 0.3  0.3 - 1.2 mg/dL   GFR calc non Af Amer 71 (*) >90 mL/min   GFR calc Af Amer 82 (*) >90 mL/min  CBC WITH DIFFERENTIAL   Collection Time    01/05/12  3:16 PM      Result Value Range   WBC 7.6  4.0 - 10.5 K/uL   RBC 4.62  3.87 - 5.11 MIL/uL   Hemoglobin 14.1  12.0 - 15.0 g/dL   HCT 91.4  78.2 - 95.6 %   MCV 91.6  78.0 - 100.0 fL   MCH 30.5  26.0 - 34.0 pg   MCHC 33.3  30.0 - 36.0 g/dL   RDW 21.3  08.6 - 57.8 %   Platelets 143 (*) 150 - 400 K/uL   Neutrophils Relative 49  43 - 77 %   Neutro Abs 3.7  1.7 - 7.7 K/uL   Lymphocytes Relative 38  12 - 46 %   Lymphs Abs 2.9  0.7 - 4.0 K/uL    Monocytes Relative 9  3 - 12 %   Monocytes Absolute 0.7  0.1 - 1.0 K/uL   Eosinophils Relative 4  0 - 5 %   Eosinophils Absolute 0.3  0.0 - 0.7 K/uL   Basophils Relative 0  0 - 1 %   Basophils Absolute 0.0  0.0 - 0.1 K/uL  URINALYSIS, ROUTINE W REFLEX MICROSCOPIC   Collection Time    01/05/12  4:07 PM      Result Value Range   Color, Urine AMBER (*) YELLOW   APPearance CLOUDY (*) CLEAR   Specific Gravity, Urine 1.037 (*) 1.005 - 1.030   pH 5.5  5.0 - 8.0   Glucose, UA NEGATIVE  NEGATIVE mg/dL   Hgb urine dipstick NEGATIVE  NEGATIVE   Bilirubin Urine MODERATE (*) NEGATIVE   Ketones, ur TRACE (*) NEGATIVE mg/dL   Protein, ur 30 (*) NEGATIVE mg/dL   Urobilinogen, UA 1.0  0.0 - 1.0 mg/dL   Nitrite NEGATIVE  NEGATIVE   Leukocytes, UA MODERATE (*) NEGATIVE  URINE RAPID DRUG SCREEN (HOSP PERFORMED)   Collection Time    01/05/12  4:07 PM      Result Value Range   Opiates NONE DETECTED  NONE DETECTED   Cocaine NONE DETECTED  NONE DETECTED   Benzodiazepines POSITIVE (*) NONE DETECTED   Amphetamines NONE DETECTED  NONE DETECTED   Tetrahydrocannabinol NONE DETECTED  NONE DETECTED   Barbiturates NONE DETECTED  NONE DETECTED  URINE MICROSCOPIC-ADD ON   Collection Time    01/05/12  4:07 PM      Result Value Range   Squamous Epithelial / LPF FEW (*) RARE   WBC, UA 11-20  <3 WBC/hpf   Crystals CA OXALATE CRYSTALS (*) NEGATIVE   Urine-Other MUCOUS PRESENT    Results for orders placed during the hospital encounter of 09/25/11 (from the past 8736 hour(s))  TSH   Collection Time    09/26/11  6:15 AM      Result Value Range  TSH 4.808 (*) 0.350 - 4.500 uIU/mL  VALPROIC ACID LEVEL   Collection Time    09/26/11  6:15 AM      Result Value Range   Valproic Acid Lvl 71.4  50.0 - 100.0 ug/mL  Results for orders placed during the hospital encounter of 09/25/11 (from the past 8736 hour(s))  URINE RAPID DRUG SCREEN (HOSP PERFORMED)   Collection Time    09/25/11  2:42 PM      Result Value  Range   Opiates NONE DETECTED  NONE DETECTED   Cocaine NONE DETECTED  NONE DETECTED   Benzodiazepines NONE DETECTED  NONE DETECTED   Amphetamines NONE DETECTED  NONE DETECTED   Tetrahydrocannabinol NONE DETECTED  NONE DETECTED   Barbiturates NONE DETECTED  NONE DETECTED  ETHANOL   Collection Time    09/25/11  2:49 PM      Result Value Range   Alcohol, Ethyl (B) <11  0 - 11 mg/dL  CBC   Collection Time    09/25/11  2:49 PM      Result Value Range   WBC 7.0  4.0 - 10.5 K/uL   RBC 4.53  3.87 - 5.11 MIL/uL   Hemoglobin 13.9  12.0 - 15.0 g/dL   HCT 11.9  14.7 - 82.9 %   MCV 92.3  78.0 - 100.0 fL   MCH 30.7  26.0 - 34.0 pg   MCHC 33.3  30.0 - 36.0 g/dL   RDW 56.2  13.0 - 86.5 %   Platelets 125 (*) 150 - 400 K/uL  COMPREHENSIVE METABOLIC PANEL   Collection Time    09/25/11  2:49 PM      Result Value Range   Sodium 141  135 - 145 mEq/L   Potassium 4.2  3.5 - 5.1 mEq/L   Chloride 101  96 - 112 mEq/L   CO2 29  19 - 32 mEq/L   Glucose, Bld 94  70 - 99 mg/dL   BUN 12  6 - 23 mg/dL   Creatinine, Ser 7.84  0.50 - 1.10 mg/dL   Calcium 69.6  8.4 - 29.5 mg/dL   Total Protein 7.2  6.0 - 8.3 g/dL   Albumin 3.8  3.5 - 5.2 g/dL   AST 15  0 - 37 U/L   ALT 12  0 - 35 U/L   Alkaline Phosphatase 100  39 - 117 U/L   Total Bilirubin 0.4  0.3 - 1.2 mg/dL   GFR calc non Af Amer >90  >90 mL/min   GFR calc Af Amer >90  >90 mL/min   Assessment Axis I bipolar disorder, OCD, PTSD, record indicates schizophrenia, but that may be doubtful.  Axis II deferred  Axis III see medical history Axis IV mild to moderate  Axis V 55-60  Plan/Discussion: I took her vitals.  I reviewed CC, tobacco/med/surg Hx, meds effects/ side effects, problem list, therapies and responses as well as current situation/symptoms discussed options. Continue current effective medications. See orders and pt instructions for more details.  MEDICATIONS this encounter: Meds ordered this encounter  Medications  . prazosin  (MINIPRESS) 1 MG capsule    Sig: Take 3 capsules (3 mg total) by mouth at bedtime. For insomnia    Dispense:  90 capsule    Refill:  1    Medical Decision Making Problem Points:  Established problem, stable/improving (1), Review of last therapy session (1) and Review of psycho-social stressors (1) Data Points:  Review or order clinical lab tests (1) Review  of medication regiment & side effects (2)  I certify that outpatient services furnished can reasonably be expected to improve the patient's condition.   Orson Aloe, MD, [facility]

## 2012-08-29 NOTE — Patient Instructions (Signed)
CUT BACK/CUT OUT on sugar and carbohydrates, that means very limited fruits and starchy vegetables and very limited grains, breads  The goal is low GLYCEMIC INDEX.  CUT OUT all wheat, rye, or barley for the GLUTEN in them.  HIGH fat and LOW carbohydrate diet is the KEY.  Eat avocados, eggs, lean meat like grass fed beef and chicken  Nuts and seeds would be good foods as well.   Stevia is an excellent sweetener.  Safe for the brain.   Lowella Grip is also a good safe sweetener, not the baking blend form of Truvia  Almond butter is awesome.  Check out all this on the Internet.  Dr Heber [place] is on the Internet with some good info about this.   http://www.drperlmutter.com is where that is.  An excellent site for info on this diet is http://paleoleap.com  [employer] makes dark chocolate that is sweetened with Stevia that is safe.  Relaxation is the ultimate solution for you.  You can seek it through tub baths, bubble baths, essential oils or incense, walking or chatting with friends, listening to soft music, watching a candle burn and just letting all thoughts go and appreciating the true essence of the Creator.  Pets or animals may be very helpful.  You might spend some time with them and then go do more directed meditation.  Take care of yourself.  No one else is standing up to do the job and only you know what you need.   GET SERIOUS about taking care of yourself.  Do the next right thing and that often means doing something to care for yourself along the lines of are you hungry, are you angry, are you lonely, are you tired, are you scared?  [employer] is what that stands for.  Call if problems or concerns.

## 2012-09-02 ENCOUNTER — Telehealth: Payer: Self-pay | Admitting: Physician Assistant

## 2012-09-02 NOTE — Telephone Encounter (Signed)
Please write what she needs and I will sign

## 2012-09-02 NOTE — Telephone Encounter (Signed)
NEEDS A NOTE STATING WHY SHE NEEDS OXYGEN AND CPAP WITH A DX. PLEASE WRITE OUT ON A NOTE AND SHE WILL PICK IT UP. FORWARD TO [facility] B/C [facility] WAS PROVIDER. NEED THIS SO THAT SHE CAN GET HELP WITH MEDICATIONS (CHEAPER). (CHART IN ROUTE)

## 2012-09-03 ENCOUNTER — Ambulatory Visit (HOSPITAL_COMMUNITY): Payer: Self-pay | Admitting: Psychiatry

## 2012-09-05 NOTE — Telephone Encounter (Signed)
Please review and order .

## 2012-09-05 NOTE — Telephone Encounter (Signed)
Please review

## 2012-09-07 NOTE — Telephone Encounter (Signed)
Please review

## 2012-09-16 NOTE — Telephone Encounter (Signed)
Pt picked up letter on 09/16/12

## 2012-09-26 ENCOUNTER — Telehealth: Payer: Self-pay | Admitting: Nurse Practitioner

## 2012-09-26 NOTE — Telephone Encounter (Signed)
New rs sent to [facility] for cpap mask

## 2012-09-30 ENCOUNTER — Encounter: Payer: Self-pay | Admitting: Nurse Practitioner

## 2012-09-30 ENCOUNTER — Ambulatory Visit (INDEPENDENT_AMBULATORY_CARE_PROVIDER_SITE_OTHER): Payer: Medicare Other | Admitting: Nurse Practitioner

## 2012-09-30 ENCOUNTER — Ambulatory Visit (HOSPITAL_COMMUNITY): Payer: Self-pay | Admitting: Psychiatry

## 2012-09-30 VITALS — BP 150/96 | HR 92 | Temp 97.6°F | Ht 69.0 in | Wt 367.2 lb

## 2012-09-30 DIAGNOSIS — F0390 Unspecified dementia without behavioral disturbance: Secondary | ICD-10-CM

## 2012-09-30 DIAGNOSIS — Z1211 Encounter for screening for malignant neoplasm of colon: Secondary | ICD-10-CM

## 2012-09-30 DIAGNOSIS — M25469 Effusion, unspecified knee: Secondary | ICD-10-CM

## 2012-09-30 DIAGNOSIS — M25569 Pain in unspecified knee: Secondary | ICD-10-CM

## 2012-09-30 DIAGNOSIS — M25561 Pain in right knee: Secondary | ICD-10-CM

## 2012-09-30 DIAGNOSIS — Z131 Encounter for screening for diabetes mellitus: Secondary | ICD-10-CM

## 2012-09-30 DIAGNOSIS — G8929 Other chronic pain: Secondary | ICD-10-CM

## 2012-09-30 DIAGNOSIS — M25461 Effusion, right knee: Secondary | ICD-10-CM

## 2012-09-30 DIAGNOSIS — Z0000 Encounter for general adult medical examination without abnormal findings: Secondary | ICD-10-CM

## 2012-09-30 DIAGNOSIS — Z23 Encounter for immunization: Secondary | ICD-10-CM

## 2012-09-30 DIAGNOSIS — R079 Chest pain, unspecified: Secondary | ICD-10-CM

## 2012-09-30 DIAGNOSIS — E039 Hypothyroidism, unspecified: Secondary | ICD-10-CM

## 2012-09-30 DIAGNOSIS — R51 Headache: Secondary | ICD-10-CM

## 2012-09-30 DIAGNOSIS — K5900 Constipation, unspecified: Secondary | ICD-10-CM

## 2012-09-30 DIAGNOSIS — M549 Dorsalgia, unspecified: Secondary | ICD-10-CM

## 2012-09-30 LAB — POCT URINALYSIS DIPSTICK
Glucose, UA: NEGATIVE
Leukocytes, UA: NEGATIVE
Nitrite, UA: NEGATIVE
Protein, UA: NEGATIVE
Spec Grav, UA: 1.02
Urobilinogen, UA: 1

## 2012-09-30 LAB — CBC
Hemoglobin: 13.7 g/dL (ref 12.0–15.0)
MCHC: 34.1 g/dL (ref 30.0–36.0)
MCV: 91.3 fl (ref 78.0–100.0)
Platelets: 187 10*3/uL (ref 150.0–400.0)
RBC: 4.39 Mil/uL (ref 3.87–5.11)

## 2012-09-30 LAB — SEDIMENTATION RATE: Sed Rate: 12 mm/hr (ref 0–22)

## 2012-09-30 MED ORDER — TETANUS-DIPHTH-ACELL PERTUSSIS 5-2.5-18.5 LF-MCG/0.5 IM SUSP
0.5000 mL | Freq: Once | INTRAMUSCULAR | Status: AC
  Administered 2012-09-30: 0.5 mL via INTRAMUSCULAR

## 2012-09-30 NOTE — Progress Notes (Signed)
Patient ID: Allison Sharp, female   DOB: 1955/08/26, 57 y.o.   MRN: [id number] Subjective:     Allison Sharp is a 57 y.o. female and is here for a comprehensive physical exam. The patient reports problems - headache for 2 weeks, worse in last 3 days; chronic back & knee pain; chest heaviness with cough; constipation.  History   Social History  . Marital Status: Single    Spouse Name: N/A    Number of Children: 2  . Years of Education: N/A   Occupational History  . Disabled     back problems   Social History Main Topics  . Smoking status: Never Smoker   . Smokeless tobacco: Not on file  . Alcohol Use: No  . Drug Use: No  . Sexually Active: No   Other Topics Concern  . Not on file   Social History Narrative  . No narrative on file   Health Maintenance  Topic Date Due  . Pap Smear  02/18/1972  . Tetanus/tdap  02/17/1973  . Colonoscopy  02/18/2004  . Influenza Vaccine  01/27/2013  . Mammogram  02/28/2014    The following portions of the patient's history were reviewed and updated as appropriate: allergies, current medications, past family history, past medical history, past social history, past surgical history and problem list.  Review of Systems Constitutional: positive for sweats, negative for chills, fatigue and fevers Eyes: negative Ears, nose, mouth, throat, and face: negative for hearing loss, hoarseness, nasal congestion, sore throat and voice change Respiratory: positive for cough, dyspnea on exertion and heaviness in chest for 2 weeks, accompanied by cough, negative for sputum and wheezing Cardiovascular: positive for chest pressure/discomfort, dyspnea and orthopnea, negative for chest pain, fatigue, irregular heart beat, lower extremity edema, orthopnea and syncope, . Gastrointestinal: positive for constipation and dysphagia, negative for abdominal pain, change in bowel habits, diarrhea, dyspepsia, nausea and vomiting Genitourinary:negative Integument/breast:  negative for rash Musculoskeletal:positive for arthralgias, back pain and bilateral knee pain with R knee swelling. Post bilateral knee replacements., negative for muscle weakness and stiff joints Neurological: positive for headaches and memory problems, negative for coordination problems, seizures, tremors and weakness Behavioral/Psych: positive for sleep disturbance, negative for aggressive behavior, decreased appetite, excessive alcohol consumption, illegal drug usage and increased appetite Endocrine: negative for temperature intolerance Allergic/Immunologic: negative for urticaria   Objective:    BP 150/96  Pulse 92  Temp(Src) 97.6 F (36.4 C) (Oral)  Ht 5\' 9"  (1.753 m)  Wt 367 lb 4 oz (166.584 kg)  BMI 54.21 kg/m2  SpO2 97% General appearance: alert, cooperative, appears stated age, no distress and morbidly obese Head: Normocephalic, without obvious abnormality, atraumatic Eyes: conjunctivae/corneas clear. PERRL, EOM's intact. Fundi benign. Ears: normal TM and external ear canal right ear, abnormal TM left ear - air-fluid level and normal canal & external L ear and . Throat: lips, mucosa, and tongue normal; teeth and gums normal Back: Slight kyphosis, midline scar lumbar spine with thickening at thoracic/lumbar junction at scar, tender to palpation over thickening and at bilateral sacroiliac joints. Lungs: clear to auscultation bilaterally Heart: regular rate and rhythm, S1, S2 normal, no murmur, click, rub or gallop Abdomen: soft, non-tender; bowel sounds normal; no masses,  no organomegaly Extremities: Homans sign is negative, no sign of DVT and R knee warm to touch, slightly swollen, feet cool to touch, normal color, pedal pulses +1 bilaterally Pulses: strong radial pulses, pedal pulses diminished, large body habitus Skin: Skin color, texture, turgor normal. No rashes  or lesions Lymph nodes: Cervical, supraclavicular, and axillary nodes normal. and tonsilar nodes enlarged,  nontender, moveable Neurologic: Grossly normal    Assessment:    1) Morbid obesity: reports 60 lb. Weight loss, continues to make efforts to increase activity and make diet changes with smaller portions, fewer carbs, and sugary foods. 2)Need for Tdap. 3)Chronic back and knee pain 4)Headache, reported dementia by family member 5) history hypothyroidism 6)Chest heaviness 7)Hypertension 8)Constipation, dysphasia, need for colon cancer screen   Plan:    1) Offered MNT to compliment efforts at weight loss, pt will consider in future. LIPIDS today.   2)Administered in office today. 3)Order Tens unit, Pain management referral, ANA, uric acid, ESR today 4)Neurology referral 5) TSH today 6)EKG in office, normal. Consider referral to pulmonology. 7)CBC, CMET 8)Referral to GI See After Visit Summary for Counseling Recommendations

## 2012-09-30 NOTE — Patient Instructions (Addendum)
You have a referral to neurology for headache and dementia evaluation, GI for constipation, dysphagia and colon cancer screen, pain management referral for chronic back and knee pain. I will call you regarding any abnormal lab results. Pleasure to meet you today!

## 2012-10-01 LAB — COMPREHENSIVE METABOLIC PANEL
AST: 16 U/L (ref 0–37)
BUN: 13 mg/dL (ref 6–23)
Calcium: 9.3 mg/dL (ref 8.4–10.5)
Chloride: 101 mEq/L (ref 96–112)
Creatinine, Ser: 0.9 mg/dL (ref 0.4–1.2)
Glucose, Bld: 75 mg/dL (ref 70–99)

## 2012-10-01 LAB — TSH: TSH: 2.72 u[IU]/mL (ref 0.35–5.50)

## 2012-10-01 LAB — CHOLESTEROL, TOTAL: Cholesterol: 167 mg/dL (ref 0–200)

## 2012-10-01 LAB — URIC ACID: Uric Acid, Serum: 5 mg/dL (ref 2.4–7.0)

## 2012-10-02 ENCOUNTER — Telehealth: Payer: Self-pay | Admitting: Nurse Practitioner

## 2012-10-02 DIAGNOSIS — K5900 Constipation, unspecified: Secondary | ICD-10-CM

## 2012-10-02 DIAGNOSIS — I10 Essential (primary) hypertension: Secondary | ICD-10-CM

## 2012-10-02 DIAGNOSIS — J45901 Unspecified asthma with (acute) exacerbation: Secondary | ICD-10-CM

## 2012-10-02 DIAGNOSIS — Z299 Encounter for prophylactic measures, unspecified: Secondary | ICD-10-CM

## 2012-10-02 DIAGNOSIS — E785 Hyperlipidemia, unspecified: Secondary | ICD-10-CM

## 2012-10-02 DIAGNOSIS — E559 Vitamin D deficiency, unspecified: Secondary | ICD-10-CM

## 2012-10-02 DIAGNOSIS — K219 Gastro-esophageal reflux disease without esophagitis: Secondary | ICD-10-CM

## 2012-10-02 DIAGNOSIS — E039 Hypothyroidism, unspecified: Secondary | ICD-10-CM

## 2012-10-02 MED ORDER — ALBUTEROL SULFATE HFA 108 (90 BASE) MCG/ACT IN AERS: 2.0000 | INHALATION_SPRAY | Freq: Four times a day (QID) | RESPIRATORY_TRACT | Status: DC | PRN

## 2012-10-02 MED ORDER — MONTELUKAST SODIUM 10 MG PO TABS: 10.0000 mg | ORAL_TABLET | Freq: Every day | ORAL | Status: DC

## 2012-10-02 MED ORDER — ASPIRIN 81 MG PO CHEW: 81.0000 mg | CHEWABLE_TABLET | Freq: Every day | ORAL | Status: DC

## 2012-10-02 MED ORDER — MAGNESIUM 200 MG PO TABS: 1.0000 | ORAL_TABLET | ORAL | Status: DC

## 2012-10-02 MED ORDER — ATORVASTATIN CALCIUM 20 MG PO TABS: 20.0000 mg | ORAL_TABLET | Freq: Every day | ORAL | Status: DC

## 2012-10-02 MED ORDER — FUROSEMIDE 20 MG PO TABS
30.0000 mg | ORAL_TABLET | Freq: Every day | ORAL | Status: DC
Start: 2012-10-02 — End: 2012-10-31

## 2012-10-02 MED ORDER — FLUTICASONE PROPIONATE 50 MCG/ACT NA SUSP: 1.0000 | Freq: Two times a day (BID) | NASAL | Status: DC

## 2012-10-02 MED ORDER — CHOLECALCIFEROL 50 MCG (2000 UT) PO TABS: 2000.0000 [IU] | ORAL_TABLET | Freq: Every day | ORAL | Status: DC

## 2012-10-02 MED ORDER — OMEPRAZOLE 20 MG PO CPDR: 20.0000 mg | DELAYED_RELEASE_CAPSULE | Freq: Every day | ORAL | Status: DC

## 2012-10-02 MED ORDER — POLYETHYLENE GLYCOL 3350 17 G PO PACK: 17.0000 g | PACK | Freq: Every day | ORAL | Status: DC

## 2012-10-02 MED ORDER — LEVOTHYROXINE SODIUM 125 MCG PO TABS: 125.0000 ug | ORAL_TABLET | Freq: Every day | ORAL | Status: DC

## 2012-10-02 NOTE — Telephone Encounter (Signed)
Patient advised of medication changes.

## 2012-10-02 NOTE — Telephone Encounter (Signed)
See telephone notes

## 2012-10-12 ENCOUNTER — Other Ambulatory Visit (HOSPITAL_COMMUNITY): Payer: Self-pay | Admitting: Psychiatry

## 2012-10-12 ENCOUNTER — Other Ambulatory Visit (HOSPITAL_COMMUNITY): Payer: Self-pay | Admitting: Family Medicine

## 2012-10-22 ENCOUNTER — Ambulatory Visit (INDEPENDENT_AMBULATORY_CARE_PROVIDER_SITE_OTHER): Payer: No Typology Code available for payment source | Admitting: Psychiatry

## 2012-10-22 ENCOUNTER — Encounter (HOSPITAL_COMMUNITY): Payer: Self-pay | Admitting: Psychiatry

## 2012-10-22 VITALS — BP 136/101 | HR 98 | Ht 68.25 in | Wt 371.2 lb

## 2012-10-22 DIAGNOSIS — F4310 Post-traumatic stress disorder, unspecified: Secondary | ICD-10-CM

## 2012-10-22 DIAGNOSIS — F429 Obsessive-compulsive disorder, unspecified: Secondary | ICD-10-CM

## 2012-10-22 DIAGNOSIS — F5105 Insomnia due to other mental disorder: Secondary | ICD-10-CM

## 2012-10-22 DIAGNOSIS — F319 Bipolar disorder, unspecified: Secondary | ICD-10-CM

## 2012-10-22 DIAGNOSIS — F250 Schizoaffective disorder, bipolar type: Secondary | ICD-10-CM

## 2012-10-22 DIAGNOSIS — E559 Vitamin D deficiency, unspecified: Secondary | ICD-10-CM

## 2012-10-22 DIAGNOSIS — F419 Anxiety disorder, unspecified: Secondary | ICD-10-CM

## 2012-10-22 DIAGNOSIS — R413 Other amnesia: Secondary | ICD-10-CM

## 2012-10-22 DIAGNOSIS — G8929 Other chronic pain: Secondary | ICD-10-CM

## 2012-10-22 MED ORDER — GABAPENTIN 100 MG PO CAPS: ORAL_CAPSULE | ORAL | Status: DC

## 2012-10-22 MED ORDER — PRAZOSIN HCL 2 MG PO CAPS: 6.0000 mg | ORAL_CAPSULE | Freq: Every day | ORAL | Status: DC

## 2012-10-22 MED ORDER — DULOXETINE HCL 60 MG PO CPEP: 60.0000 mg | ORAL_CAPSULE | Freq: Two times a day (BID) | ORAL | Status: DC

## 2012-10-22 MED ORDER — MIRTAZAPINE 15 MG PO TABS: 7.5000 mg | ORAL_TABLET | Freq: Every day | ORAL | Status: DC

## 2012-10-22 MED ORDER — BUPROPION HCL ER (SR) 150 MG PO TB12: 150.0000 mg | ORAL_TABLET | Freq: Every day | ORAL | Status: DC

## 2012-10-22 MED ORDER — DIVALPROEX SODIUM ER 500 MG PO TB24: ORAL_TABLET | ORAL | Status: DC

## 2012-10-22 MED ORDER — AMITRIPTYLINE HCL 50 MG PO TABS: ORAL_TABLET | ORAL | Status: DC

## 2012-10-22 MED ORDER — DONEPEZIL HCL 10 MG PO TABS: 10.0000 mg | ORAL_TABLET | Freq: Every day | ORAL | Status: DC

## 2012-10-22 NOTE — Patient Instructions (Signed)
Minipress has been changed to TWO mg, so increase to 3 tonight and 4 tomorrow night if that doesn't work.  Increase Elavil to 50 mg at night  Cut Remeron to 7.5 mg that seems to work better for getting to sleep for most folks.  I intend for these changes to get you back to sleeping.  Call [facility] if that doesn't happen.  Try [employer], they help people with skewed issues with food.  Take care of yourself.  No one else is standing up to do the job and only you know what you need.   GET SERIOUS about taking care of yourself.  Do the next right thing and that often means doing something to care for yourself along the lines of are you hungry, are you angry, are you lonely, are you tired, are you scared?  [employer] is what that stands for.  Call if problems or concerns.

## 2012-10-22 NOTE — Addendum Note (Signed)
Addended by: Mike Craze on: 10/22/2012 10:10 AM   Modules accepted: Orders

## 2012-10-22 NOTE — Progress Notes (Signed)
[facility] [id number] Progress Note Allison Sharp MRN: [id number] DOB: 22-Nov-1955 Age: 57 y.o.  Date: 10/22/2012 Start Time: 9:45 AM End Time: 10:05 AM  Chief Complaint: Chief Complaint  Patient presents with  . Depression  . Follow-up  . Medication Refill   Subjective: "Im not sleeping and I've eatten my way back to 371". Depression 10/10 and Anxiety 9/10, where 1 is the best and 10 is the worst.  Pain is 6/10 her back and shoulder are the primary problem  History of presenting illness Patient is 57 year old Caucasian female who came for her follow up appointment.  Pt reports that she is compliant with the psychotropic medications with good benefit and no noticeable side effects.  Her sleeping has improved with Elavil 25 and Minipress 3 mg.  Her anger and rage have subsided with the increase in Depakote.  Current psychiatric medication Depakote 1500 mg at bedtime and 500 during the day for a couple of day Cymbalta 60 mg twice a day Aricept 10 mg in AM Wellbutrin SR 150 mg daily Neurontin 100 mg three times a day Elavil 25 mg at bed time Minipress 5 mg at bed time  Past psychiatric history Patient has multiple psychiatric admission. She has at least 3 his suicidal attempt in the past. Her last admission was in August 2013.  In the past she had tried Paxil Prozac Wellbutrin Effexor Lexapro amitriptyline however she always had a good response with Cymbalta but it causes constipation, which is relieved by Miralax. She has been diagnosed with bipolar disorder  Allergies: Allergies  Allergen Reactions  . Haldol (Haloperidol) Other (See Comments)    Hallucinating   . Ativan (Lorazepam) Other (See Comments)    Delirium  . Naproxen Nausea And Vomiting    Medical History: Past Medical History  Diagnosis Date  . Hyperlipidemia   . CAD (coronary artery disease)   . Bipolar 1 disorder   . Schizophrenia   . GERD (gastroesophageal reflux disease)   . Hyperthyroidism   .  Chronic back pain   . Dyspnea     PFT 03/05/09 FEV1 2.77(98%), FVC 3.25(86%), FEV1% 85, TLC 5.88(99%), DLCO 60% ,  Methacholine challenge 03/16/09 normal ,  CT chest 03/12/09 no pulmonary disease  . Anxiety   . Arthritis   . Depression   . OSA on CPAP     2 liters  . HTN (hypertension)   . History of colonoscopy 10/17/2002    by Dr Rehman-> distal non-specific proctitis, small ext hemorrhoids,   . Fungal infection   . Cellulitis   . Contusion of sacrum   . Migraine headache   . Vitamin D deficiency   . Sleep apnea   . Myocardial infarction     NOV 1997  . Hypothyroidism     States she only has hyperthyroidism  . Complication of anesthesia     States she typically gets sick s/p anesthesia  . Morbid obesity with body mass index of 50.0-59.9 in adult JAN 2011 370 LBS    2004 311 BMI 45.9  . Obsessive-compulsive disorder   . PTSD (post-traumatic stress disorder)   . Asthma   . Allergy   . Urine incontinence   . Chronic headaches   . Suicidal ideation   . COPD (chronic obstructive pulmonary disease)   Patient has history of hyperlipidemia, GERD, chronic back pain, obesity, arthritis, hypertension, abstract and sleep apnea, vitamin D deficiency and history of myocardial infarction. Her primary care physician is  Dr. Ileana Ladd  in [place] [facility].  Surgical History: Past Surgical History  Procedure Laterality Date  . Cholecystectomy    . Knee arthroscopy    . Appendectomy    . Tonsillectomy    . Back surgery  2008  . Total vaginal hysterectomy    . Abdominal hysterectomy      sept 1996  . Tubal ligation    . Colonoscopy  10/17/2002     Distal proctitis, small external hemorrhoids, otherwise/  normal colonoscopy. Suspect rectal bleeding secondary to hemorrhoids  . Esophagogastroduodenoscopy  03/18/09    fundic gland polyps/mild gastritis  . Cardiac catheterization      nov 1997  . Joint replacement      bil knee replacement  . Hernia repair  1978    Family History family history includes Alcohol abuse in her father; Bipolar disorder in her brother, mother, and sister; Cancer in her mother and son; Coronary artery disease in her brother and father; Dementia in her mother; Depression in her brother, mother, and sisters; Hypertension in her brother, mother, and sister; and Paranoid behavior in her sister.  There is no history of Anesthesia problems, and Hypotension, and Malignant hyperthermia, and Pseudochol deficiency, .  Social history Patient currently living with her sister .  She has a plan to move into assisted living in the future.    Mental status examination Patient is casually dressed and fairly groomed.  She is obese female.  She maintained fair eye contact.  Her speech is fast but clear and coherent.  Her thought process is also fast but logical linear and goal-directed.  She appears today more agitated than her last visit.  She described her mood is good and her affect is bright.  He denies any active or passive suicidal thoughts or homicidal thoughts.  She denies any auditory or visual hallucination.  Her fund of knowledge is adequate.  There were no tremors or shakes.  She denies any delusion or paranoid thinking.  Her attention and concentration is the best as I have seen with her.  She's alert oriented x3.  Her insight judgment and impulse control is okay.    Lab Results:  Results for orders placed in visit on 09/30/12 (from the past 8736 hour(s))  HM PAP SMEAR   Collection Time    02/27/12 12:00 AM      Result Value Range   HM Pap smear Mammogram    CBC   Collection Time    09/30/12 11:57 AM      Result Value Range   WBC 9.5  4.5 - 10.5 K/uL   RBC 4.39  3.87 - 5.11 Mil/uL   Platelets 187.0  150.0 - 400.0 K/uL   Hemoglobin 13.7  12.0 - 15.0 g/dL   HCT 78.2  95.6 - 21.3 %   MCV 91.3  78.0 - 100.0 fl   MCHC 34.1  30.0 - 36.0 g/dL   RDW 08.6  57.8 - 46.9 %  COMPREHENSIVE METABOLIC PANEL   Collection Time    09/30/12  11:57 AM      Result Value Range   Sodium 139  135 - 145 mEq/L   Potassium 4.2  3.5 - 5.1 mEq/L   Chloride 101  96 - 112 mEq/L   CO2 30  19 - 32 mEq/L   Glucose, Bld 75  70 - 99 mg/dL   BUN 13  6 - 23 mg/dL   Creatinine, Ser 0.9  0.4 - 1.2 mg/dL   Total Bilirubin  0.5  0.3 - 1.2 mg/dL   Alkaline Phosphatase 96  39 - 117 U/L   AST 16  0 - 37 U/L   ALT 13  0 - 35 U/L   Total Protein 7.3  6.0 - 8.3 g/dL   Albumin 4.0  3.5 - 5.2 g/dL   Calcium 9.3  8.4 - 16.1 mg/dL   GFR 09.60  >45.40 mL/min  CHOLESTEROL, TOTAL   Collection Time    09/30/12 11:57 AM      Result Value Range   Cholesterol 167  0 - 200 mg/dL  TSH   Collection Time    09/30/12 11:57 AM      Result Value Range   TSH 2.72  0.35 - 5.50 uIU/mL  HEMOGLOBIN A1C   Collection Time    09/30/12 11:57 AM      Result Value Range   Hemoglobin A1C 5.4  4.6 - 6.5 %  VITAMIN D 25 HYDROXY   Collection Time    09/30/12 11:57 AM      Result Value Range   Vit D, 25-Hydroxy 37  30 - 89 ng/mL  SEDIMENTATION RATE   Collection Time    09/30/12 11:57 AM      Result Value Range   Sed Rate 12  0 - 22 mm/hr  ANA   Collection Time    09/30/12 11:57 AM      Result Value Range   ANA NEG  NEGATIVE  URIC ACID   Collection Time    09/30/12 11:57 AM      Result Value Range   Uric Acid, Serum 5.0  2.4 - 7.0 mg/dL  POCT URINALYSIS DIPSTICK   Collection Time    09/30/12 12:08 PM      Result Value Range   Color, UA yellow     Clarity, UA clear     Glucose, UA neg     Bilirubin, UA neg     Ketones, UA neg     Spec Grav, UA 1.020     Blood, UA neg     pH, UA 6.5     Protein, UA neg     Urobilinogen, UA 1.0     Nitrite, UA neg     Leukocytes, UA Negative    Results for orders placed in visit on 06/12/12 (from the past 8736 hour(s))  VALPROIC ACID LEVEL   Collection Time    06/12/12  2:40 PM      Result Value Range   Valproic Acid Lvl 81.2  50.0 - 100.0 ug/mL  Results for orders placed during the hospital encounter of 01/09/12  (from the past 8736 hour(s))  URINALYSIS, ROUTINE W REFLEX MICROSCOPIC   Collection Time    01/12/12  6:39 PM      Result Value Range   Color, Urine YELLOW  YELLOW   APPearance TURBID (*) CLEAR   Specific Gravity, Urine 1.012  1.005 - 1.030   pH 6.5  5.0 - 8.0   Glucose, UA NEGATIVE  NEGATIVE mg/dL   Hgb urine dipstick MODERATE (*) NEGATIVE   Bilirubin Urine NEGATIVE  NEGATIVE   Ketones, ur NEGATIVE  NEGATIVE mg/dL   Protein, ur NEGATIVE  NEGATIVE mg/dL   Urobilinogen, UA 1.0  0.0 - 1.0 mg/dL   Nitrite NEGATIVE  NEGATIVE   Leukocytes, UA MODERATE (*) NEGATIVE  URINE MICROSCOPIC-ADD ON   Collection Time    01/12/12  6:39 PM      Result Value Range   Squamous Epithelial /  LPF MANY (*) RARE   WBC, UA 0-2  <3 WBC/hpf   RBC / HPF 0-2  <3 RBC/hpf   Bacteria, UA FEW (*) RARE   Casts HYALINE CASTS (*) NEGATIVE   Urine-Other AMORPHOUS URATES/PHOSPHATES    VALPROIC ACID LEVEL   Collection Time    01/12/12  7:36 PM      Result Value Range   Valproic Acid Lvl 61.9  50.0 - 100.0 ug/mL  COMPREHENSIVE METABOLIC PANEL   Collection Time    01/12/12  7:36 PM      Result Value Range   Sodium 140  135 - 145 mEq/L   Potassium 3.9  3.5 - 5.1 mEq/L   Chloride 102  96 - 112 mEq/L   CO2 33 (*) 19 - 32 mEq/L   Glucose, Bld 122 (*) 70 - 99 mg/dL   BUN 9  6 - 23 mg/dL   Creatinine, Ser 1.61  0.50 - 1.10 mg/dL   Calcium 9.2  8.4 - 09.6 mg/dL   Total Protein 6.4  6.0 - 8.3 g/dL   Albumin 3.3 (*) 3.5 - 5.2 g/dL   AST 15  0 - 37 U/L   ALT 14  0 - 35 U/L   Alkaline Phosphatase 83  39 - 117 U/L   Total Bilirubin 0.3  0.3 - 1.2 mg/dL   GFR calc non Af Amer 66 (*) >90 mL/min   GFR calc Af Amer 77 (*) >90 mL/min  COMPREHENSIVE METABOLIC PANEL   Collection Time    01/17/12  7:37 PM      Result Value Range   Sodium 138  135 - 145 mEq/L   Potassium 4.0  3.5 - 5.1 mEq/L   Chloride 101  96 - 112 mEq/L   CO2 30  19 - 32 mEq/L   Glucose, Bld 115 (*) 70 - 99 mg/dL   BUN 9  6 - 23 mg/dL   Creatinine,  Ser 0.45  0.50 - 1.10 mg/dL   Calcium 9.6  8.4 - 40.9 mg/dL   Total Protein 6.5  6.0 - 8.3 g/dL   Albumin 3.3 (*) 3.5 - 5.2 g/dL   AST 17  0 - 37 U/L   ALT 10  0 - 35 U/L   Alkaline Phosphatase 74  39 - 117 U/L   Total Bilirubin 0.4  0.3 - 1.2 mg/dL   GFR calc non Af Amer 75 (*) >90 mL/min   GFR calc Af Amer 86 (*) >90 mL/min  VALPROIC ACID LEVEL   Collection Time    01/17/12  7:37 PM      Result Value Range   Valproic Acid Lvl 54.9  50.0 - 100.0 ug/mL  URINALYSIS, ROUTINE W REFLEX MICROSCOPIC   Collection Time    01/17/12  9:25 PM      Result Value Range   Color, Urine AMBER (*) YELLOW   APPearance CLOUDY (*) CLEAR   Specific Gravity, Urine 1.027  1.005 - 1.030   pH 6.5  5.0 - 8.0   Glucose, UA NEGATIVE  NEGATIVE mg/dL   Hgb urine dipstick SMALL (*) NEGATIVE   Bilirubin Urine SMALL (*) NEGATIVE   Ketones, ur NEGATIVE  NEGATIVE mg/dL   Protein, ur NEGATIVE  NEGATIVE mg/dL   Urobilinogen, UA 1.0  0.0 - 1.0 mg/dL   Nitrite NEGATIVE  NEGATIVE   Leukocytes, UA LARGE (*) NEGATIVE  URINE MICROSCOPIC-ADD ON   Collection Time    01/17/12  9:25 PM      Result  Value Range   Squamous Epithelial / LPF FEW (*) RARE   WBC, UA 11-20  <3 WBC/hpf   RBC / HPF 3-6  <3 RBC/hpf   Bacteria, UA MANY (*) RARE   Crystals CA OXALATE CRYSTALS (*) NEGATIVE   Urine-Other MUCOUS PRESENT    Results for orders placed during the hospital encounter of 01/07/12 (from the past 8736 hour(s))  URINE CULTURE   Collection Time    01/07/12  3:36 PM      Result Value Range   Specimen Description URINE, CLEAN CATCH     Special Requests NONE     Culture  Setup Time 01/08/2012 03:15     Colony Count 20,OOO COLONIES/ML     Culture KLEBSIELLA PNEUMONIAE     Report Status 01/10/2012 FINAL     Organism ID, Bacteria KLEBSIELLA PNEUMONIAE    URINALYSIS, ROUTINE W REFLEX MICROSCOPIC   Collection Time    01/07/12  3:36 PM      Result Value Range   Color, Urine YELLOW  YELLOW   APPearance CLOUDY (*) CLEAR    Specific Gravity, Urine 1.016  1.005 - 1.030   pH 7.5  5.0 - 8.0   Glucose, UA NEGATIVE  NEGATIVE mg/dL   Hgb urine dipstick NEGATIVE  NEGATIVE   Bilirubin Urine NEGATIVE  NEGATIVE   Ketones, ur NEGATIVE  NEGATIVE mg/dL   Protein, ur NEGATIVE  NEGATIVE mg/dL   Urobilinogen, UA 2.0 (*) 0.0 - 1.0 mg/dL   Nitrite NEGATIVE  NEGATIVE   Leukocytes, UA MODERATE (*) NEGATIVE  URINE MICROSCOPIC-ADD ON   Collection Time    01/07/12  3:36 PM      Result Value Range   Squamous Epithelial / LPF RARE  RARE   WBC, UA 11-20  <3 WBC/hpf   Bacteria, UA MANY (*) RARE  URINE RAPID DRUG SCREEN (HOSP PERFORMED)   Collection Time    01/07/12  3:37 PM      Result Value Range   Opiates NONE DETECTED  NONE DETECTED   Cocaine NONE DETECTED  NONE DETECTED   Benzodiazepines POSITIVE (*) NONE DETECTED   Amphetamines NONE DETECTED  NONE DETECTED   Tetrahydrocannabinol NONE DETECTED  NONE DETECTED   Barbiturates NONE DETECTED  NONE DETECTED  CBC WITH DIFFERENTIAL   Collection Time    01/07/12  4:19 PM      Result Value Range   WBC 8.5  4.0 - 10.5 K/uL   RBC 4.60  3.87 - 5.11 MIL/uL   Hemoglobin 13.9  12.0 - 15.0 g/dL   HCT 30.8  65.7 - 84.6 %   MCV 91.1  78.0 - 100.0 fL   MCH 30.2  26.0 - 34.0 pg   MCHC 33.2  30.0 - 36.0 g/dL   RDW 96.2  95.2 - 84.1 %   Platelets 137 (*) 150 - 400 K/uL   Neutrophils Relative % 56  43 - 77 %   Neutro Abs 4.7  1.7 - 7.7 K/uL   Lymphocytes Relative 29  12 - 46 %   Lymphs Abs 2.5  0.7 - 4.0 K/uL   Monocytes Relative 11  3 - 12 %   Monocytes Absolute 1.0  0.1 - 1.0 K/uL   Eosinophils Relative 3  0 - 5 %   Eosinophils Absolute 0.3  0.0 - 0.7 K/uL   Basophils Relative 1  0 - 1 %   Basophils Absolute 0.1  0.0 - 0.1 K/uL  COMPREHENSIVE METABOLIC PANEL   Collection Time  01/07/12  4:19 PM      Result Value Range   Sodium 138  135 - 145 mEq/L   Potassium 3.8  3.5 - 5.1 mEq/L   Chloride 99  96 - 112 mEq/L   CO2 32  19 - 32 mEq/L   Glucose, Bld 108 (*) 70 - 99 mg/dL    BUN 8  6 - 23 mg/dL   Creatinine, Ser 0.98  0.50 - 1.10 mg/dL   Calcium 9.6  8.4 - 11.9 mg/dL   Total Protein 7.1  6.0 - 8.3 g/dL   Albumin 3.6  3.5 - 5.2 g/dL   AST 38 (*) 0 - 37 U/L   ALT 39 (*) 0 - 35 U/L   Alkaline Phosphatase 120 (*) 39 - 117 U/L   Total Bilirubin 0.4  0.3 - 1.2 mg/dL   GFR calc non Af Amer 78 (*) >90 mL/min   GFR calc Af Amer >90  >90 mL/min  ETHANOL   Collection Time    01/07/12  4:19 PM      Result Value Range   Alcohol, Ethyl (B) <11  0 - 11 mg/dL  ACETAMINOPHEN LEVEL   Collection Time    01/07/12  4:19 PM      Result Value Range   Acetaminophen (Tylenol), Serum <15.0  10 - 30 ug/mL  VALPROIC ACID LEVEL   Collection Time    01/07/12  7:04 PM      Result Value Range   Valproic Acid Lvl 27.1 (*) 50.0 - 100.0 ug/mL  T3   Collection Time    01/07/12 11:44 PM      Result Value Range   T3, Total 91.9  80.0 - 204.0 ng/dl  T4   Collection Time    01/07/12 11:44 PM      Result Value Range   T4, Total 6.7  5.0 - 12.5 ug/dL  TSH   Collection Time    01/07/12 11:44 PM      Result Value Range   TSH 3.536  0.350 - 4.500 uIU/mL  Results for orders placed during the hospital encounter of 01/05/12 (from the past 8736 hour(s))  COMPREHENSIVE METABOLIC PANEL   Collection Time    01/05/12  3:16 PM      Result Value Range   Sodium 138  135 - 145 mEq/L   Potassium 4.2  3.5 - 5.1 mEq/L   Chloride 100  96 - 112 mEq/L   CO2 31  19 - 32 mEq/L   Glucose, Bld 120 (*) 70 - 99 mg/dL   BUN 11  6 - 23 mg/dL   Creatinine, Ser 1.47  0.50 - 1.10 mg/dL   Calcium 9.5  8.4 - 82.9 mg/dL   Total Protein 7.2  6.0 - 8.3 g/dL   Albumin 3.6  3.5 - 5.2 g/dL   AST 20  0 - 37 U/L   ALT 18  0 - 35 U/L   Alkaline Phosphatase 98  39 - 117 U/L   Total Bilirubin 0.3  0.3 - 1.2 mg/dL   GFR calc non Af Amer 71 (*) >90 mL/min   GFR calc Af Amer 82 (*) >90 mL/min  CBC WITH DIFFERENTIAL   Collection Time    01/05/12  3:16 PM      Result Value Range   WBC 7.6  4.0 - 10.5 K/uL    RBC 4.62  3.87 - 5.11 MIL/uL   Hemoglobin 14.1  12.0 - 15.0 g/dL   HCT 42.3  36.0 - 46.0 %   MCV 91.6  78.0 - 100.0 fL   MCH 30.5  26.0 - 34.0 pg   MCHC 33.3  30.0 - 36.0 g/dL   RDW 11.9  14.7 - 82.9 %   Platelets 143 (*) 150 - 400 K/uL   Neutrophils Relative % 49  43 - 77 %   Neutro Abs 3.7  1.7 - 7.7 K/uL   Lymphocytes Relative 38  12 - 46 %   Lymphs Abs 2.9  0.7 - 4.0 K/uL   Monocytes Relative 9  3 - 12 %   Monocytes Absolute 0.7  0.1 - 1.0 K/uL   Eosinophils Relative 4  0 - 5 %   Eosinophils Absolute 0.3  0.0 - 0.7 K/uL   Basophils Relative 0  0 - 1 %   Basophils Absolute 0.0  0.0 - 0.1 K/uL  URINALYSIS, ROUTINE W REFLEX MICROSCOPIC   Collection Time    01/05/12  4:07 PM      Result Value Range   Color, Urine AMBER (*) YELLOW   APPearance CLOUDY (*) CLEAR   Specific Gravity, Urine 1.037 (*) 1.005 - 1.030   pH 5.5  5.0 - 8.0   Glucose, UA NEGATIVE  NEGATIVE mg/dL   Hgb urine dipstick NEGATIVE  NEGATIVE   Bilirubin Urine MODERATE (*) NEGATIVE   Ketones, ur TRACE (*) NEGATIVE mg/dL   Protein, ur 30 (*) NEGATIVE mg/dL   Urobilinogen, UA 1.0  0.0 - 1.0 mg/dL   Nitrite NEGATIVE  NEGATIVE   Leukocytes, UA MODERATE (*) NEGATIVE  URINE RAPID DRUG SCREEN (HOSP PERFORMED)   Collection Time    01/05/12  4:07 PM      Result Value Range   Opiates NONE DETECTED  NONE DETECTED   Cocaine NONE DETECTED  NONE DETECTED   Benzodiazepines POSITIVE (*) NONE DETECTED   Amphetamines NONE DETECTED  NONE DETECTED   Tetrahydrocannabinol NONE DETECTED  NONE DETECTED   Barbiturates NONE DETECTED  NONE DETECTED  URINE MICROSCOPIC-ADD ON   Collection Time    01/05/12  4:07 PM      Result Value Range   Squamous Epithelial / LPF FEW (*) RARE   WBC, UA 11-20  <3 WBC/hpf   Crystals CA OXALATE CRYSTALS (*) NEGATIVE   Urine-Other MUCOUS PRESENT     Assessment Axis I bipolar disorder, OCD, PTSD, record indicates schizophrenia, but that may be doubtful.  Axis II deferred  Axis III see medical  history Axis IV mild to moderate  Axis V 55-60  Plan/Discussion: I took her vitals.  I reviewed CC, tobacco/med/surg Hx, meds effects/ side effects, problem list, therapies and responses as well as current situation/symptoms discussed options. Adjust sleeping meds or admit to hospital See orders and pt instructions for more details.  MEDICATIONS this encounter: No orders of the defined types were placed in this encounter.   Medical Decision Making Problem Points:  Established problem, stable/improving (1), Review of last therapy session (1) and Review of psycho-social stressors (1) Data Points:  Review or order clinical lab tests (1) Review of medication regiment & side effects (2)  I certify that outpatient services furnished can reasonably be expected to improve the patient's condition.   Orson Aloe, MD, [facility]

## 2012-10-23 ENCOUNTER — Encounter: Payer: Self-pay | Admitting: Internal Medicine

## 2012-10-24 ENCOUNTER — Telehealth (HOSPITAL_COMMUNITY): Payer: Self-pay | Admitting: Licensed Clinical Social Worker

## 2012-10-24 ENCOUNTER — Encounter (HOSPITAL_COMMUNITY): Payer: Self-pay | Admitting: Emergency Medicine

## 2012-10-24 ENCOUNTER — Encounter (HOSPITAL_COMMUNITY): Payer: Self-pay | Admitting: *Deleted

## 2012-10-24 ENCOUNTER — Inpatient Hospital Stay (HOSPITAL_COMMUNITY)
Admission: AD | Admit: 2012-10-24 | Discharge: 2012-10-31 | DRG: 885 | Disposition: A | Discharge status: Home / Self Care | Payer: No Typology Code available for payment source | Source: Intra-hospital | Attending: Psychiatry | Admitting: Psychiatry

## 2012-10-24 ENCOUNTER — Emergency Department (HOSPITAL_COMMUNITY)
Admission: EM | Admit: 2012-10-24 | Discharge: 2012-10-24 | Disposition: A | Discharge status: Other Acute Inpatient Hospital | Payer: Medicare Other | Attending: Emergency Medicine | Admitting: Emergency Medicine

## 2012-10-24 ENCOUNTER — Telehealth (HOSPITAL_COMMUNITY): Payer: Self-pay | Admitting: Psychiatry

## 2012-10-24 DIAGNOSIS — R1314 Dysphagia, pharyngoesophageal phase: Secondary | ICD-10-CM

## 2012-10-24 DIAGNOSIS — Z8709 Personal history of other diseases of the respiratory system: Secondary | ICD-10-CM | POA: Insufficient documentation

## 2012-10-24 DIAGNOSIS — Z7982 Long term (current) use of aspirin: Secondary | ICD-10-CM | POA: Insufficient documentation

## 2012-10-24 DIAGNOSIS — F429 Obsessive-compulsive disorder, unspecified: Secondary | ICD-10-CM

## 2012-10-24 DIAGNOSIS — Z8619 Personal history of other infectious and parasitic diseases: Secondary | ICD-10-CM | POA: Insufficient documentation

## 2012-10-24 DIAGNOSIS — E7800 Pure hypercholesterolemia, unspecified: Secondary | ICD-10-CM

## 2012-10-24 DIAGNOSIS — I10 Essential (primary) hypertension: Secondary | ICD-10-CM

## 2012-10-24 DIAGNOSIS — K5900 Constipation, unspecified: Secondary | ICD-10-CM

## 2012-10-24 DIAGNOSIS — R45851 Suicidal ideations: Secondary | ICD-10-CM

## 2012-10-24 DIAGNOSIS — F329 Major depressive disorder, single episode, unspecified: Secondary | ICD-10-CM | POA: Insufficient documentation

## 2012-10-24 DIAGNOSIS — F4310 Post-traumatic stress disorder, unspecified: Secondary | ICD-10-CM

## 2012-10-24 DIAGNOSIS — R1013 Epigastric pain: Secondary | ICD-10-CM | POA: Insufficient documentation

## 2012-10-24 DIAGNOSIS — Z9889 Other specified postprocedural states: Secondary | ICD-10-CM | POA: Insufficient documentation

## 2012-10-24 DIAGNOSIS — Z6841 Body Mass Index (BMI) 40.0 and over, adult: Secondary | ICD-10-CM | POA: Insufficient documentation

## 2012-10-24 DIAGNOSIS — M129 Arthropathy, unspecified: Secondary | ICD-10-CM | POA: Insufficient documentation

## 2012-10-24 DIAGNOSIS — J449 Chronic obstructive pulmonary disease, unspecified: Secondary | ICD-10-CM | POA: Insufficient documentation

## 2012-10-24 DIAGNOSIS — F607 Dependent personality disorder: Secondary | ICD-10-CM | POA: Diagnosis present

## 2012-10-24 DIAGNOSIS — F489 Nonpsychotic mental disorder, unspecified: Secondary | ICD-10-CM | POA: Insufficient documentation

## 2012-10-24 DIAGNOSIS — E039 Hypothyroidism, unspecified: Secondary | ICD-10-CM

## 2012-10-24 DIAGNOSIS — F319 Bipolar disorder, unspecified: Secondary | ICD-10-CM

## 2012-10-24 DIAGNOSIS — I219 Acute myocardial infarction, unspecified: Secondary | ICD-10-CM

## 2012-10-24 DIAGNOSIS — Z79899 Other long term (current) drug therapy: Secondary | ICD-10-CM | POA: Insufficient documentation

## 2012-10-24 DIAGNOSIS — E785 Hyperlipidemia, unspecified: Secondary | ICD-10-CM | POA: Insufficient documentation

## 2012-10-24 DIAGNOSIS — M549 Dorsalgia, unspecified: Secondary | ICD-10-CM | POA: Insufficient documentation

## 2012-10-24 DIAGNOSIS — E559 Vitamin D deficiency, unspecified: Secondary | ICD-10-CM

## 2012-10-24 DIAGNOSIS — K219 Gastro-esophageal reflux disease without esophagitis: Secondary | ICD-10-CM

## 2012-10-24 DIAGNOSIS — Z9981 Dependence on supplemental oxygen: Secondary | ICD-10-CM | POA: Insufficient documentation

## 2012-10-24 DIAGNOSIS — Z87828 Personal history of other (healed) physical injury and trauma: Secondary | ICD-10-CM | POA: Insufficient documentation

## 2012-10-24 DIAGNOSIS — F332 Major depressive disorder, recurrent severe without psychotic features: Principal | ICD-10-CM | POA: Diagnosis present

## 2012-10-24 DIAGNOSIS — F250 Schizoaffective disorder, bipolar type: Secondary | ICD-10-CM | POA: Diagnosis present

## 2012-10-24 DIAGNOSIS — Z299 Encounter for prophylactic measures, unspecified: Secondary | ICD-10-CM

## 2012-10-24 DIAGNOSIS — F259 Schizoaffective disorder, unspecified: Secondary | ICD-10-CM | POA: Insufficient documentation

## 2012-10-24 DIAGNOSIS — R45850 Homicidal ideations: Secondary | ICD-10-CM

## 2012-10-24 DIAGNOSIS — F5105 Insomnia due to other mental disorder: Secondary | ICD-10-CM

## 2012-10-24 DIAGNOSIS — R413 Other amnesia: Secondary | ICD-10-CM

## 2012-10-24 DIAGNOSIS — J4489 Other specified chronic obstructive pulmonary disease: Secondary | ICD-10-CM | POA: Insufficient documentation

## 2012-10-24 DIAGNOSIS — F419 Anxiety disorder, unspecified: Secondary | ICD-10-CM

## 2012-10-24 DIAGNOSIS — G8929 Other chronic pain: Secondary | ICD-10-CM

## 2012-10-24 DIAGNOSIS — J45901 Unspecified asthma with (acute) exacerbation: Secondary | ICD-10-CM

## 2012-10-24 DIAGNOSIS — R450 Nervousness: Secondary | ICD-10-CM | POA: Insufficient documentation

## 2012-10-24 DIAGNOSIS — K5909 Other constipation: Secondary | ICD-10-CM

## 2012-10-24 DIAGNOSIS — F3289 Other specified depressive episodes: Secondary | ICD-10-CM | POA: Insufficient documentation

## 2012-10-24 DIAGNOSIS — R1310 Dysphagia, unspecified: Secondary | ICD-10-CM | POA: Insufficient documentation

## 2012-10-24 DIAGNOSIS — IMO0002 Reserved for concepts with insufficient information to code with codable children: Secondary | ICD-10-CM | POA: Insufficient documentation

## 2012-10-24 DIAGNOSIS — I2510 Atherosclerotic heart disease of native coronary artery without angina pectoris: Secondary | ICD-10-CM

## 2012-10-24 DIAGNOSIS — R51 Headache: Secondary | ICD-10-CM | POA: Insufficient documentation

## 2012-10-24 DIAGNOSIS — E569 Vitamin deficiency, unspecified: Secondary | ICD-10-CM | POA: Insufficient documentation

## 2012-10-24 DIAGNOSIS — K7689 Other specified diseases of liver: Secondary | ICD-10-CM

## 2012-10-24 DIAGNOSIS — R0602 Shortness of breath: Secondary | ICD-10-CM

## 2012-10-24 DIAGNOSIS — Z872 Personal history of diseases of the skin and subcutaneous tissue: Secondary | ICD-10-CM | POA: Insufficient documentation

## 2012-10-24 DIAGNOSIS — F411 Generalized anxiety disorder: Secondary | ICD-10-CM | POA: Insufficient documentation

## 2012-10-24 DIAGNOSIS — J45909 Unspecified asthma, uncomplicated: Secondary | ICD-10-CM | POA: Diagnosis present

## 2012-10-24 DIAGNOSIS — G4730 Sleep apnea, unspecified: Secondary | ICD-10-CM | POA: Insufficient documentation

## 2012-10-24 DIAGNOSIS — Z87448 Personal history of other diseases of urinary system: Secondary | ICD-10-CM | POA: Insufficient documentation

## 2012-10-24 DIAGNOSIS — G4733 Obstructive sleep apnea (adult) (pediatric): Secondary | ICD-10-CM | POA: Insufficient documentation

## 2012-10-24 LAB — BASIC METABOLIC PANEL
BUN: 15 mg/dL (ref 6–23)
Chloride: 100 mEq/L (ref 96–112)
GFR calc Af Amer: >90 mL/min (ref >90)
GFR calc non Af Amer: >90 mL/min (ref >90)
Potassium: 4 mEq/L (ref 3.5–5.1)

## 2012-10-24 LAB — URINALYSIS, ROUTINE W REFLEX MICROSCOPIC
Glucose, UA: NEGATIVE mg/dL
Hgb urine dipstick: NEGATIVE
Ketones, ur: NEGATIVE mg/dL
Leukocytes, UA: NEGATIVE
Protein, ur: NEGATIVE mg/dL
Urobilinogen, UA: 1 mg/dL (ref 0.0–1.0)

## 2012-10-24 LAB — CBC
HCT: 38.7 % (ref 36.0–46.0)
MCHC: 32.6 g/dL (ref 30.0–36.0)
Platelets: 166 10*3/uL (ref 150–400)
RDW: 12.9 % (ref 11.5–15.5)
WBC: 7.8 10*3/uL (ref 4.0–10.5)

## 2012-10-24 LAB — RAPID URINE DRUG SCREEN, HOSP PERFORMED
Amphetamines: NOT DETECTED
Tetrahydrocannabinol: NOT DETECTED

## 2012-10-24 LAB — VALPROIC ACID LEVEL: Valproic Acid Lvl: 69.4 ug/mL (ref 50.0–100.0)

## 2012-10-24 MED ORDER — PREDNISONE 10 MG PO TABS
10.0000 mg | ORAL_TABLET | Freq: Every day | ORAL | Status: DC
  Administered 2012-10-25 – 2012-10-26 (×2): 10 mg via ORAL
  Filled 2012-10-24 (×4): qty 1

## 2012-10-24 MED ORDER — AMITRIPTYLINE HCL 25 MG PO TABS: 50.0000 mg | ORAL_TABLET | Freq: Every day | ORAL | Status: DC

## 2012-10-24 MED ORDER — ALBUTEROL SULFATE HFA 108 (90 BASE) MCG/ACT IN AERS
2.0000 | INHALATION_SPRAY | Freq: Four times a day (QID) | RESPIRATORY_TRACT | Status: DC | PRN
  Administered 2012-10-26 – 2012-10-31 (×9): 2 via RESPIRATORY_TRACT
  Filled 2012-10-24: qty 6.7

## 2012-10-24 MED ORDER — PANTOPRAZOLE SODIUM 40 MG PO TBEC
40.0000 mg | DELAYED_RELEASE_TABLET | Freq: Every day | ORAL | Status: DC
  Administered 2012-10-24: 40 mg via ORAL

## 2012-10-24 MED ORDER — FLUTICASONE PROPIONATE 50 MCG/ACT NA SUSP
1.0000 | Freq: Two times a day (BID) | NASAL | Status: DC
  Administered 2012-10-24: 1 via NASAL
  Filled 2012-10-24: qty 16

## 2012-10-24 MED ORDER — ALUM & MAG HYDROXIDE-SIMETH 200-200-20 MG/5ML PO SUSP: 30.0000 mL | ORAL | Status: DC | PRN

## 2012-10-24 MED ORDER — PANTOPRAZOLE SODIUM 40 MG PO TBEC
40.0000 mg | DELAYED_RELEASE_TABLET | Freq: Every day | ORAL | Status: DC
  Administered 2012-10-25 – 2012-10-31 (×7): 40 mg via ORAL
  Filled 2012-10-24 (×9): qty 1

## 2012-10-24 MED ORDER — PREDNISONE 20 MG PO TABS
10.0000 mg | ORAL_TABLET | Freq: Every day | ORAL | Status: DC
  Administered 2012-10-24: 10 mg via ORAL
  Filled 2012-10-24: qty 1

## 2012-10-24 MED ORDER — ADULT MULTIVITAMIN W/MINERALS CH
1.0000 | ORAL_TABLET | Freq: Every day | ORAL | Status: DC
  Administered 2012-10-25 – 2012-10-31 (×7): 1 via ORAL
  Filled 2012-10-24 (×9): qty 1

## 2012-10-24 MED ORDER — POLYETHYLENE GLYCOL 3350 17 G PO PACK
17.0000 g | PACK | Freq: Every day | ORAL | Status: DC
  Administered 2012-10-24: 17 g via ORAL
  Filled 2012-10-24: qty 1

## 2012-10-24 MED ORDER — ATORVASTATIN CALCIUM 20 MG PO TABS
20.0000 mg | ORAL_TABLET | Freq: Every day | ORAL | Status: DC
  Administered 2012-10-25 – 2012-10-31 (×7): 20 mg via ORAL
  Filled 2012-10-24 (×9): qty 1

## 2012-10-24 MED ORDER — PRAZOSIN HCL 1 MG PO CAPS
6.0000 mg | ORAL_CAPSULE | Freq: Every day | ORAL | Status: DC
  Administered 2012-10-24: 6 mg via ORAL
  Filled 2012-10-24 (×3): qty 6

## 2012-10-24 MED ORDER — AMITRIPTYLINE HCL 50 MG PO TABS
50.0000 mg | ORAL_TABLET | Freq: Every day | ORAL | Status: DC
  Administered 2012-10-24 – 2012-10-30 (×7): 50 mg via ORAL
  Filled 2012-10-24 (×9): qty 1

## 2012-10-24 MED ORDER — ALPRAZOLAM 0.5 MG PO TABS
1.0000 mg | ORAL_TABLET | Freq: Once | ORAL | Status: AC
  Administered 2012-10-24: 1 mg via ORAL
  Filled 2012-10-24: qty 2

## 2012-10-24 MED ORDER — MAGNESIUM HYDROXIDE 400 MG/5ML PO SUSP: 30.0000 mL | Freq: Every day | ORAL | Status: DC | PRN

## 2012-10-24 MED ORDER — BUPROPION HCL ER (SR) 150 MG PO TB12
150.0000 mg | ORAL_TABLET | Freq: Every day | ORAL | Status: DC
  Administered 2012-10-25: 150 mg via ORAL
  Filled 2012-10-24 (×2): qty 1

## 2012-10-24 MED ORDER — PANTOPRAZOLE SODIUM 40 MG PO TBEC
40.0000 mg | DELAYED_RELEASE_TABLET | Freq: Every day | ORAL | Status: DC
  Filled 2012-10-24: qty 1

## 2012-10-24 MED ORDER — MIRTAZAPINE 7.5 MG PO TABS
7.5000 mg | ORAL_TABLET | Freq: Every day | ORAL | Status: DC
  Filled 2012-10-24: qty 1

## 2012-10-24 MED ORDER — ATORVASTATIN CALCIUM 20 MG PO TABS
20.0000 mg | ORAL_TABLET | Freq: Every day | ORAL | Status: DC
  Administered 2012-10-24: 20 mg via ORAL
  Filled 2012-10-24: qty 1

## 2012-10-24 MED ORDER — DULOXETINE HCL 60 MG PO CPEP
60.0000 mg | ORAL_CAPSULE | Freq: Two times a day (BID) | ORAL | Status: DC
  Administered 2012-10-24: 60 mg via ORAL
  Filled 2012-10-24 (×2): qty 1

## 2012-10-24 MED ORDER — POLYETHYLENE GLYCOL 3350 17 G PO PACK
17.0000 g | PACK | Freq: Every day | ORAL | Status: DC
  Administered 2012-10-25 – 2012-10-31 (×7): 17 g via ORAL
  Filled 2012-10-24 (×10): qty 1

## 2012-10-24 MED ORDER — MIRTAZAPINE 7.5 MG PO TABS
7.5000 mg | ORAL_TABLET | Freq: Every day | ORAL | Status: DC
  Administered 2012-10-24: 7.5 mg via ORAL
  Filled 2012-10-24 (×2): qty 1

## 2012-10-24 MED ORDER — MONTELUKAST SODIUM 10 MG PO TABS
10.0000 mg | ORAL_TABLET | Freq: Every day | ORAL | Status: DC
  Filled 2012-10-24: qty 1

## 2012-10-24 MED ORDER — DONEPEZIL HCL 10 MG PO TABS
10.0000 mg | ORAL_TABLET | Freq: Every day | ORAL | Status: DC
  Filled 2012-10-24: qty 1

## 2012-10-24 MED ORDER — ALBUTEROL SULFATE HFA 108 (90 BASE) MCG/ACT IN AERS: 2.0000 | INHALATION_SPRAY | Freq: Four times a day (QID) | RESPIRATORY_TRACT | Status: DC | PRN

## 2012-10-24 MED ORDER — BUPROPION HCL ER (SR) 150 MG PO TB12
150.0000 mg | ORAL_TABLET | Freq: Every day | ORAL | Status: DC
  Filled 2012-10-24: qty 1

## 2012-10-24 MED ORDER — DIVALPROEX SODIUM ER 500 MG PO TB24
1500.0000 mg | ORAL_TABLET | Freq: Every day | ORAL | Status: DC
  Filled 2012-10-24: qty 3

## 2012-10-24 MED ORDER — DONEPEZIL HCL 10 MG PO TABS
10.0000 mg | ORAL_TABLET | Freq: Every day | ORAL | Status: DC
  Administered 2012-10-25 – 2012-10-31 (×7): 10 mg via ORAL
  Filled 2012-10-24 (×10): qty 1

## 2012-10-24 MED ORDER — PRAZOSIN HCL 2 MG PO CAPS
6.0000 mg | ORAL_CAPSULE | Freq: Every day | ORAL | Status: DC
  Filled 2012-10-24: qty 4

## 2012-10-24 MED ORDER — ASPIRIN 81 MG PO CHEW
81.0000 mg | CHEWABLE_TABLET | Freq: Every day | ORAL | Status: DC
  Administered 2012-10-25 – 2012-10-31 (×7): 81 mg via ORAL
  Filled 2012-10-24 (×9): qty 1

## 2012-10-24 MED ORDER — ACETAMINOPHEN 325 MG PO TABS
650.0000 mg | ORAL_TABLET | Freq: Four times a day (QID) | ORAL | Status: DC | PRN
  Administered 2012-10-27 – 2012-10-28 (×2): 650 mg via ORAL

## 2012-10-24 MED ORDER — LEVOTHYROXINE SODIUM 125 MCG PO TABS
125.0000 ug | ORAL_TABLET | Freq: Every day | ORAL | Status: DC
  Filled 2012-10-24: qty 1

## 2012-10-24 MED ORDER — LEVOTHYROXINE SODIUM 125 MCG PO TABS
125.0000 ug | ORAL_TABLET | Freq: Every day | ORAL | Status: DC
  Administered 2012-10-25 – 2012-10-31 (×7): 125 ug via ORAL
  Filled 2012-10-24 (×10): qty 1

## 2012-10-24 MED ORDER — MONTELUKAST SODIUM 10 MG PO TABS
10.0000 mg | ORAL_TABLET | Freq: Every day | ORAL | Status: DC
  Administered 2012-10-24 – 2012-10-26 (×3): 10 mg via ORAL
  Filled 2012-10-24 (×5): qty 1

## 2012-10-24 MED ORDER — FLUTICASONE PROPIONATE 50 MCG/ACT NA SUSP
1.0000 | Freq: Two times a day (BID) | NASAL | Status: DC
  Administered 2012-10-25 – 2012-10-28 (×6): 1 via NASAL
  Filled 2012-10-24: qty 16

## 2012-10-24 MED ORDER — DULOXETINE HCL 60 MG PO CPEP
60.0000 mg | ORAL_CAPSULE | Freq: Two times a day (BID) | ORAL | Status: DC
  Administered 2012-10-24 – 2012-10-31 (×14): 60 mg via ORAL
  Filled 2012-10-24 (×18): qty 1

## 2012-10-24 MED ORDER — ADULT MULTIVITAMIN W/MINERALS CH
1.0000 | ORAL_TABLET | Freq: Every day | ORAL | Status: DC
  Administered 2012-10-24: 1 via ORAL
  Filled 2012-10-24: qty 1

## 2012-10-24 MED ORDER — DIVALPROEX SODIUM ER 500 MG PO TB24
1500.0000 mg | ORAL_TABLET | Freq: Every day | ORAL | Status: DC
  Administered 2012-10-24 – 2012-10-30 (×7): 1500 mg via ORAL
  Filled 2012-10-24 (×9): qty 3

## 2012-10-24 MED ORDER — ASPIRIN 81 MG PO CHEW
81.0000 mg | CHEWABLE_TABLET | Freq: Every day | ORAL | Status: DC
  Administered 2012-10-24: 81 mg via ORAL
  Filled 2012-10-24: qty 1

## 2012-10-24 NOTE — [facility] Suicide Risk Assessment (Deleted)
[facility] INPATIENT:  Family/Significant Other Suicide Prevention Education  Suicide Prevention Education:  Contact Attempts: Joselyn Arrow, Daughter, [phone]; has been identified by the patient as the family member/significant other with whom the patient will be residing, and identified as the person(s) who will aid the patient in the event of a mental health crisis.  With written consent from the patient, two attempts were made to provide suicide prevention education, prior to and/or following the patient's discharge.  We were unsuccessful in providing suicide prevention education.  A suicide education pamphlet was given to the patient to share with family/significant other.  Daughter's phone number is not working.  Date and time of first attempt:   10/24/12@ 10:30 AM Date and time of second attempt:  10/24/12 @ 1:00 PM  Tanaysha Alkins Hairston 10/24/2012, 1:03 PM

## 2012-10-24 NOTE — Telephone Encounter (Signed)
Discussed the need to admit and adjust sleep meds with Intake worker at [facility].  They wil get he radmitted.

## 2012-10-24 NOTE — ED Notes (Signed)
Report called to Zella Ball, RN. To be transported via [facility]

## 2012-10-24 NOTE — Tx Team (Signed)
Initial Interdisciplinary Treatment Plan  PATIENT STRENGTHS: (choose at least two) Ability for insight Active sense of humor Average or above average intelligence Capable of independent living Communication skills Financial means General fund of knowledge Motivation for treatment/growth Religious Affiliation Special hobby/interest  PATIENT STRESSORS: Marital or family conflict   PROBLEM LIST: Problem List/Patient Goals Date to be addressed Date deferred Reason deferred Estimated date of resolution  "Want to learn to cope with the feeling of rage that has come over me." 24 Oct 2012     "Need to find something that helps me sleep." 24 Oct 2012     "Get depression back under control." 24 Oct 2012                                          DISCHARGE CRITERIA:  Ability to meet basic life and health needs Adequate post-discharge living arrangements Improved stabilization in mood, thinking, and/or behavior Medical problems require only outpatient monitoring Need for constant or close observation no longer present  PRELIMINARY DISCHARGE PLAN: Outpatient therapy   PATIENT/FAMIILY INVOLVEMENT: This treatment plan has been presented to and reviewed with the patient, Allison Sharp, and/or family member.  The patient and family have been given the opportunity to ask questions and make suggestions.  Izola Price Mae 10/24/2012, 3:22 PM

## 2012-10-24 NOTE — ED Notes (Signed)
Patient is aware that we need a urine specimen. Patient is unable to urinate at this time.

## 2012-10-24 NOTE — Progress Notes (Signed)
Patient ID: Allison Sharp, female   DOB: [birth year], 57 y.o.   MRN: [id number] D:  57 year old widowed Caucasian female admitted with increased depression and feelings of rage.  She is well know to the unit and was last here nearly one year ago.  States she and her niece took an apartment together in March and their lifestyles are very different.  Patient states that niece refuses to unpack boxes that have been present since the move and the clutter and disorganization are becoming too much for her to handle.  She states she has a best friend that she is considering asking for help to get the work done after she is discharged.  She is on multiple medications.  She has a history of MI, bilateral knee replacement, back surgery, sleep apnea, COPD, HTN, and hypothyroidism.  She sleeps with a CPAP machine connected to oxygen.  She is close with her sister, who also helps her with transportation.  She is a patient of Dr. Nicholes Calamity who is leaving his outpatient practice soon and this is another source of distress for her.  States she has finally become very comfortable with him and hates the fact that he is leaving.  Would like to get back in with Dr. Lolly Mustache after discharge as she worked with him for six years prior to Dr. Dan Humphreys.  Also sees a PCP at [facility] and sees him regularly.  A:  Admission completed.  Patient oriented to the unit and to her surroundings.  Also oriented to the group schedules.  She was offered food and fluids.  15 minute checks were initiated.  Skin assessment completed and is intact, but she does have a couple of bruises on her upper arms.  She states she has been off balance recently and the bruising is from falling against the wall.  R:  Pleasant and cooperative.  Interacting well with staff and peers.  Started attending and participating in groups right away on arrival to the unit.  Safety is maintained.  Patient is receptive to ideas and suggestions regarding lifestyle changes.

## 2012-10-24 NOTE — BH Assessment (Addendum)
Assessment Note   Allison Sharp is an 57 y.o. female referred to [facility] by her psychiatrist Dr. Dan Humphreys. Sts that she is increasingly angry and irritable. Her anger is directed to her family whom she resides with, sister and niece. Sts that their is also a 29 yr old great nephew but feels calm around him. Her feelings toward her niece has escalated to homicidal thoughts. Sts that last night she had urges to strangle her niece while sleeping. Patient has felt homicidal for a week or so and also feels suicidal currently. She has a plan to overdose. She sts, "I have plenty of medications to do what I need to do!!". Patient reports 2-3 prior inpatient hospitalizations for suicide attempts (all overdoses). She was admitted to [facility] (1x) and [facility] (2x's). Patient is unable to contract for safety against harming herself and also others. Patient's reports increased depression and anxiety triggered by recently moving into a new home with her family. Says that boxes are everywhere and she is the only one taking responsibility and unpacking the boxes. Patient says when she left home this am she told her family, "Those darn boxes better be unpacked at least the the living room should be neat". Patient admits to feeling OCD in addition to her diagnosis of depression, schizophrenia, and Bipolar I Disorder.   She denies current symptoms of  AVH's. However, admits to a previous history of AVH's.   No alcohol and drug use reported.   Patient referred to [facility] stating she would like her medications reviewed and possibly adjusted.      Axis I: Bipolar, Depressed without psychotic features Axis II: Deferred Axis III:  Past Medical History  Diagnosis Date  . Hyperlipidemia   . CAD (coronary artery disease)   . Bipolar 1 disorder   . Schizophrenia   . GERD (gastroesophageal reflux disease)   . Hyperthyroidism   . Chronic back pain   . Dyspnea     PFT 03/05/09 FEV1 2.77(98%), FVC 3.25(86%), FEV1% 85, TLC  5.88(99%), DLCO 60% ,  Methacholine challenge 03/16/09 normal ,  CT chest 03/12/09 no pulmonary disease  . Anxiety   . Arthritis   . Depression   . OSA on CPAP     2 liters  . HTN (hypertension)   . History of colonoscopy 10/17/2002    by Dr Rehman-> distal non-specific proctitis, small ext hemorrhoids,   . Fungal infection   . Cellulitis   . Contusion of sacrum   . Migraine headache   . Vitamin D deficiency   . Sleep apnea   . Myocardial infarction     NOV 1997  . Hypothyroidism     States she only has hyperthyroidism  . Complication of anesthesia     States she typically gets sick s/p anesthesia  . Morbid obesity with body mass index of 50.0-59.9 in adult JAN 2011 370 LBS    2004 311 BMI 45.9  . Obsessive-compulsive disorder   . PTSD (post-traumatic stress disorder)   . Asthma   . Allergy   . Urine incontinence   . Chronic headaches   . Suicidal ideation   . COPD (chronic obstructive pulmonary disease)    Axis IV: other psychosocial or environmental problems, problems related to social environment and problems with primary support group Axis V: 31-40 impairment in reality testing  Past Medical History:  Past Medical History  Diagnosis Date  . Hyperlipidemia   . CAD (coronary artery disease)   . Bipolar 1  disorder   . Schizophrenia   . GERD (gastroesophageal reflux disease)   . Hyperthyroidism   . Chronic back pain   . Dyspnea     PFT 03/05/09 FEV1 2.77(98%), FVC 3.25(86%), FEV1% 85, TLC 5.88(99%), DLCO 60% ,  Methacholine challenge 03/16/09 normal ,  CT chest 03/12/09 no pulmonary disease  . Anxiety   . Arthritis   . Depression   . OSA on CPAP     2 liters  . HTN (hypertension)   . History of colonoscopy 10/17/2002    by Dr Rehman-> distal non-specific proctitis, small ext hemorrhoids,   . Fungal infection   . Cellulitis   . Contusion of sacrum   . Migraine headache   . Vitamin D deficiency   . Sleep apnea   . Myocardial infarction     NOV 1997  .  Hypothyroidism     States she only has hyperthyroidism  . Complication of anesthesia     States she typically gets sick s/p anesthesia  . Morbid obesity with body mass index of 50.0-59.9 in adult JAN 2011 370 LBS    2004 311 BMI 45.9  . Obsessive-compulsive disorder   . PTSD (post-traumatic stress disorder)   . Asthma   . Allergy   . Urine incontinence   . Chronic headaches   . Suicidal ideation   . COPD (chronic obstructive pulmonary disease)     Past Surgical History  Procedure Laterality Date  . Cholecystectomy    . Knee arthroscopy    . Appendectomy    . Tonsillectomy    . Back surgery  2008  . Total vaginal hysterectomy    . Abdominal hysterectomy      sept 1996  . Tubal ligation    . Colonoscopy  10/17/2002     Distal proctitis, small external hemorrhoids, otherwise/  normal colonoscopy. Suspect rectal bleeding secondary to hemorrhoids  . Esophagogastroduodenoscopy  03/18/09    fundic gland polyps/mild gastritis  . Cardiac catheterization      nov 1997  . Joint replacement      bil knee replacement  . Hernia repair  1978    Family History:  Family History  Problem Relation Age of Onset  . Cancer Mother   . Hypertension Mother   . Bipolar disorder Mother   . Dementia Mother   . Depression Mother   . Coronary artery disease Father   . Alcohol abuse Father   . Hypertension Brother   . Coronary artery disease Brother   . Bipolar disorder Brother   . Depression Brother   . Anesthesia problems Neg Hx   . Hypotension Neg Hx   . Malignant hyperthermia Neg Hx   . Pseudochol deficiency Neg Hx   . Depression Sister   . Paranoid behavior Sister   . Bipolar disorder Sister   . Depression Sister   . Hypertension Sister   . Cancer Son     thyroid    Social History:  reports that she has never smoked. She has never used smokeless tobacco. She reports that she does not drink alcohol or use illicit drugs.  Additional Social History:     CIWA:   COWS:     Allergies:  Allergies  Allergen Reactions  . Haldol (Haloperidol) Other (See Comments)    Hallucinating   . Ativan (Lorazepam) Other (See Comments)    Delirium  . Naproxen Nausea And Vomiting    Home Medications:  (Not in a hospital admission)  OB/GYN Status:  No LMP recorded. Patient has had a hysterectomy.                                                            Disposition:     On Site Evaluation by:   Reviewed with Physician:     Melynda Ripple [facility] 10/24/2012 12:48 PM

## 2012-10-24 NOTE — Progress Notes (Signed)
Pt did not attend Karaoke group.  

## 2012-10-24 NOTE — ED Notes (Signed)
Pt c/o of increased depression and anger issue since seeing her psychiatrist. States that he has "changed her medication", and doesn't seem to be working. HI/SI.

## 2012-10-24 NOTE — BHH Counselor (Signed)
Patient accepted to [facility] by Alvy Beal, NP to Dr. Daleen Bo. The room assignment is 502-2. EDP-Dr. Patria Mane made aware of patient's disposition. Patient's nurse-Eric also made aware. The nursing report # is [phone]. Support paperwork completed and faxed to [facility] assessment office. Patient is voluntary and will be transported to [facility] via hospital security.

## 2012-10-24 NOTE — Consult Note (Signed)
Reason for Consult:  SI/HI and worsening depression Referring Physician: EDP  Allison Sharp is an 57 y.o. female.  HPI: Patient states that she has had a worsening of depression and increased rage and thoughts of hurting herself or other for 1 week.  States that she saw her primary psychiatrist Dr. Dan Humphreys and her medications were adjusted but there has been no improvement only worsening.  States that she was informed by Dr. Dan Humphreys if she got no better to come to the hospital.  Patient states that she has had a problem with depression for a long time and that her first admission for treatment was in the late seventies.  Patient states that she has been compliant with medications and out patient services.  Patient is currently having SI with plan "I got enough pills".  Patient is currently HI with plan "I am going to kill my niece by smothering her."  Patient denies AVH.  Patient states that her mind feels "like it is going 200 mph and I can't stop it."  Patient lives with her sister and daughter (niece).    Past Medical History  Diagnosis Date  . Hyperlipidemia   . CAD (coronary artery disease)   . Bipolar 1 disorder   . Schizophrenia   . GERD (gastroesophageal reflux disease)   . Hyperthyroidism   . Chronic back pain   . Dyspnea     PFT 03/05/09 FEV1 2.77(98%), FVC 3.25(86%), FEV1% 85, TLC 5.88(99%), DLCO 60% ,  Methacholine challenge 03/16/09 normal ,  CT chest 03/12/09 no pulmonary disease  . Anxiety   . Arthritis   . Depression   . OSA on CPAP     2 liters  . HTN (hypertension)   . History of colonoscopy 10/17/2002    by Dr Rehman-> distal non-specific proctitis, small ext hemorrhoids,   . Fungal infection   . Cellulitis   . Contusion of sacrum   . Migraine headache   . Vitamin D deficiency   . Sleep apnea   . Myocardial infarction     NOV 1997  . Hypothyroidism     States she only has hyperthyroidism  . Complication of anesthesia     States she typically gets sick s/p anesthesia   . Morbid obesity with body mass index of 50.0-59.9 in adult JAN 2011 370 LBS    2004 311 BMI 45.9  . Obsessive-compulsive disorder   . PTSD (post-traumatic stress disorder)   . Asthma   . Allergy   . Urine incontinence   . Chronic headaches   . Suicidal ideation   . COPD (chronic obstructive pulmonary disease)     Past Surgical History  Procedure Laterality Date  . Cholecystectomy    . Knee arthroscopy    . Appendectomy    . Tonsillectomy    . Back surgery  2008  . Total vaginal hysterectomy    . Abdominal hysterectomy      sept 1996  . Tubal ligation    . Colonoscopy  10/17/2002     Distal proctitis, small external hemorrhoids, otherwise/  normal colonoscopy. Suspect rectal bleeding secondary to hemorrhoids  . Esophagogastroduodenoscopy  03/18/09    fundic gland polyps/mild gastritis  . Cardiac catheterization      nov 1997  . Joint replacement      bil knee replacement  . Hernia repair  1978    Family History  Problem Relation Age of Onset  . Cancer Mother   . Hypertension Mother   .  Bipolar disorder Mother   . Dementia Mother   . Depression Mother   . Coronary artery disease Father   . Alcohol abuse Father   . Hypertension Brother   . Coronary artery disease Brother   . Bipolar disorder Brother   . Depression Brother   . Anesthesia problems Neg Hx   . Hypotension Neg Hx   . Malignant hyperthermia Neg Hx   . Pseudochol deficiency Neg Hx   . Depression Sister   . Paranoid behavior Sister   . Bipolar disorder Sister   . Depression Sister   . Hypertension Sister   . Cancer Son     thyroid    Social History:  reports that she has never smoked. She has never used smokeless tobacco. She reports that she does not drink alcohol or use illicit drugs.  Allergies:  Allergies  Allergen Reactions  . Haldol (Haloperidol) Other (See Comments)    Hallucinating   . Ativan (Lorazepam) Other (See Comments)    Delirium  . Naproxen Nausea And Vomiting     Medications: I have reviewed the patient's current medications.  Results for orders placed during the hospital encounter of 10/24/12 (from the past 48 hour(s))  CBC     Status: None   Collection Time    10/24/12  8:30 AM      Result Value Range   WBC 7.8  4.0 - 10.5 K/uL   RBC 4.28  3.87 - 5.11 MIL/uL   Hemoglobin 12.6  12.0 - 15.0 g/dL   HCT 16.1  09.6 - 04.5 %   MCV 90.4  78.0 - 100.0 fL   MCH 29.4  26.0 - 34.0 pg   MCHC 32.6  30.0 - 36.0 g/dL   RDW 40.9  81.1 - 91.4 %   Platelets 166  150 - 400 K/uL  BASIC METABOLIC PANEL     Status: Abnormal   Collection Time    10/24/12  8:30 AM      Result Value Range   Sodium 137  135 - 145 mEq/L   Potassium 4.0  3.5 - 5.1 mEq/L   Chloride 100  96 - 112 mEq/L   CO2 29  19 - 32 mEq/L   Glucose, Bld 102 (*) 70 - 99 mg/dL   BUN 15  6 - 23 mg/dL   Creatinine, Ser 7.82  0.50 - 1.10 mg/dL   Calcium 9.8  8.4 - 95.6 mg/dL   GFR calc non Af Amer >90  >90 mL/min   GFR calc Af Amer >90  >90 mL/min   Comment:            The eGFR has been calculated     using the CKD EPI equation.     This calculation has not been     validated in all clinical     situations.     eGFR's persistently     <90 mL/min signify     possible Chronic Kidney Disease.  VALPROIC ACID LEVEL     Status: None   Collection Time    10/24/12  8:30 AM      Result Value Range   Valproic Acid Lvl 69.4  50.0 - 100.0 ug/mL  ETHANOL     Status: None   Collection Time    10/24/12  8:30 AM      Result Value Range   Alcohol, Ethyl (B) <11  0 - 11 mg/dL   Comment:  LOWEST DETECTABLE LIMIT FOR     SERUM ALCOHOL IS 11 mg/dL     FOR MEDICAL PURPOSES ONLY  URINALYSIS, ROUTINE W REFLEX MICROSCOPIC     Status: None   Collection Time    10/24/12  9:13 AM      Result Value Range   Color, Urine YELLOW  YELLOW   APPearance CLEAR  CLEAR   Specific Gravity, Urine 1.021  1.005 - 1.030   pH 5.5  5.0 - 8.0   Glucose, UA NEGATIVE  NEGATIVE mg/dL   Hgb urine dipstick  NEGATIVE  NEGATIVE   Bilirubin Urine NEGATIVE  NEGATIVE   Ketones, ur NEGATIVE  NEGATIVE mg/dL   Protein, ur NEGATIVE  NEGATIVE mg/dL   Urobilinogen, UA 1.0  0.0 - 1.0 mg/dL   Nitrite NEGATIVE  NEGATIVE   Leukocytes, UA NEGATIVE  NEGATIVE   Comment: MICROSCOPIC NOT DONE ON URINES WITH NEGATIVE PROTEIN, BLOOD, LEUKOCYTES, NITRITE, OR GLUCOSE <1000 mg/dL.  URINE RAPID DRUG SCREEN (HOSP PERFORMED)     Status: None   Collection Time    10/24/12  9:13 AM      Result Value Range   Opiates NONE DETECTED  NONE DETECTED   Cocaine NONE DETECTED  NONE DETECTED   Benzodiazepines NONE DETECTED  NONE DETECTED   Amphetamines NONE DETECTED  NONE DETECTED   Tetrahydrocannabinol NONE DETECTED  NONE DETECTED   Barbiturates NONE DETECTED  NONE DETECTED   Comment:            DRUG SCREEN FOR MEDICAL PURPOSES     ONLY.  IF CONFIRMATION IS NEEDED     FOR ANY PURPOSE, NOTIFY LAB     WITHIN 5 DAYS.                LOWEST DETECTABLE LIMITS     FOR URINE DRUG SCREEN     Drug Class       Cutoff (ng/mL)     Amphetamine      1000     Barbiturate      200     Benzodiazepine   200     Tricyclics       300     Opiates          300     Cocaine          300     THC              50    No results found.  Review of Systems  Cardiovascular: Negative.        HTN   Gastrointestinal: Negative.        GERD  Musculoskeletal: Positive for back pain (chronic back pain and arthritis).  Neurological: Negative.   Psychiatric/Behavioral: Positive for depression and suicidal ideas. Negative for hallucinations, memory loss and substance abuse. The patient is nervous/anxious and has insomnia.    Blood pressure 128/75, pulse 110, temperature 98.4 F (36.9 C), temperature source Oral, SpO2 98.00%. Physical Exam  Psychiatric: Cognition and memory are normal. She expresses homicidal and suicidal ideation. She expresses suicidal plans and homicidal plans.    Assessment/Plan: Recommendation:  In patient treatment     Patient accepted to [facility]  [address]  Assunta Found 10/24/2012, 10:42 AM   Patient was seen and examined personally. She was morbidly obese and has chronic mental illness like, bipolar disorder, PTSD from only survivor of house fine about two years ago and has been on out patient care over six years. Patient was  referred by outpatient psychiatrist as she failed to respond to out patient medication management. Case discussed with treatment team and in agreement with the above plan of in patient treatment for crisis stabilization, safety and appropriate medication management.   Vibhav Waddill,JANARDHAHA R. 10/24/2012 3:53 PM

## 2012-10-24 NOTE — BHH Group Notes (Signed)
BHH LCSW Group Therapy  10/24/2012  1:15 PM   Type of Therapy:  Group Therapy  Participation Level:  Active  Participation Quality:  Appropriate and Attentive  Affect:  Appropriate  Cognitive:  Alert and Appropriate  Insight:  Developing/Improving and Engaged  Engagement in Therapy:  Developing/Improving and Engaged  Modes of Intervention:  Activity, Clarification, Confrontation, Discussion, Education, Exploration, Limit-setting, Orientation, Problem-solving, Rapport Building, Reality Testing, Socialization and Support  Summary of Progress/Problems: Patient was attentive and engaged with speaker from [employer].  Patient expressed interest in their programs and services and received information on their agency.  Patient processed ways they can relate to the speaker.       Briscoe Daniello Horton, LCSWA 10/24/2012 12:58 PM    

## 2012-10-24 NOTE — Discharge Instructions (Signed)
Be sure to follow up with resources and follow ups given.  If you find that you can not keep an appointment, call and reschedule. °

## 2012-10-24 NOTE — Progress Notes (Signed)
Adult Psychoeducational Group Note  Date:  10/24/2012 Time:  1:28 PM  Group Topic/Focus:  Rediscovering Joy:   The focus of this group is to explore various ways to relieve stress in a positive manner.  Participation Level:  Active  Participation Quality:  Appropriate  Affect:  Appropriate  Cognitive:  Appropriate  Insight: Appropriate  Engagement in Group:  Engaged  Modes of Intervention:  Discussion  Additional Comments:  Pt participated in the discussion of what brings joy to your life. She wrote on the white board that watching a baby learn to walk brings her joy. She spoke of her grandchildren and wishing she saw them more than she does.  Reynolds Bowl 10/24/2012, 1:28 PM

## 2012-10-24 NOTE — ED Provider Notes (Signed)
History     CSN: [id number]  Arrival date & time 10/24/12  4098   First MD Initiated Contact with Patient 10/24/12 0757      Chief Complaint  Patient presents with  . Medical Clearance    The history is provided by the patient.   patient reports increasing depression and anger issues.  She followed up with her psychiatrist 2 days ago and he made some medication recommendations and medication changes.  She's been trying this and still not sleeping.  The patient reports that her psychiatrist asked her come [facility] if her symptoms did not improve.  She states not only that her symptoms are improved but there worsening.  She has homicidal and suicidal thoughts.  She is extremely agitated and angry per the family.  The patient is been compliant with her medications.  She is requesting help and assistance.  Her symptoms are moderate to severe in severity.  Nothing worsens or improves her symptoms.  She has only slept 4 hours in the past 2 nights.   Past Medical History  Diagnosis Date  . Hyperlipidemia   . CAD (coronary artery disease)   . Bipolar 1 disorder   . Schizophrenia   . GERD (gastroesophageal reflux disease)   . Hyperthyroidism   . Chronic back pain   . Dyspnea     PFT 03/05/09 FEV1 2.77(98%), FVC 3.25(86%), FEV1% 85, TLC 5.88(99%), DLCO 60% ,  Methacholine challenge 03/16/09 normal ,  CT chest 03/12/09 no pulmonary disease  . Anxiety   . Arthritis   . Depression   . OSA on CPAP     2 liters  . HTN (hypertension)   . History of colonoscopy 10/17/2002    by Dr Rehman-> distal non-specific proctitis, small ext hemorrhoids,   . Fungal infection   . Cellulitis   . Contusion of sacrum   . Migraine headache   . Vitamin D deficiency   . Sleep apnea   . Myocardial infarction     NOV 1997  . Hypothyroidism     States she only has hyperthyroidism  . Complication of anesthesia     States she typically gets sick s/p  anesthesia  . Morbid obesity with body mass index of 50.0-59.9 in adult JAN 2011 370 LBS    2004 311 BMI 45.9  . Obsessive-compulsive disorder   . PTSD (post-traumatic stress disorder)   . Asthma   . Allergy   . Urine incontinence   . Chronic headaches   . Suicidal ideation   . COPD (chronic obstructive pulmonary disease)     Past Surgical History  Procedure Laterality Date  . Cholecystectomy    . Knee arthroscopy    . Appendectomy    . Tonsillectomy    . Back surgery  2008  . Total vaginal hysterectomy    . Abdominal hysterectomy      sept 1996  . Tubal ligation    . Colonoscopy  10/17/2002     Distal proctitis, small external hemorrhoids, otherwise/  normal colonoscopy. Suspect rectal bleeding secondary to hemorrhoids  . Esophagogastroduodenoscopy  03/18/09    fundic gland polyps/mild gastritis  . Cardiac catheterization      nov 1997  . Joint replacement      bil knee replacement  . Hernia repair  1978    Family History  Problem Relation Age of Onset  . Cancer Mother   . Hypertension Mother   . Bipolar disorder  Mother   . Dementia Mother   . Depression Mother   . Coronary artery disease Father   . Alcohol abuse Father   . Hypertension Brother   . Coronary artery disease Brother   . Bipolar disorder Brother   . Depression Brother   . Anesthesia problems Neg Hx   . Hypotension Neg Hx   . Malignant hyperthermia Neg Hx   . Pseudochol deficiency Neg Hx   . Depression Sister   . Paranoid behavior Sister   . Bipolar disorder Sister   . Depression Sister   . Hypertension Sister   . Cancer Son     thyroid    History  Substance Use Topics  . Smoking status: Never Smoker   . Smokeless tobacco: Never Used  . Alcohol Use: No    OB History   Grav Para Term Preterm Abortions TAB SAB Ect Mult Living                  Review of Systems  All other systems reviewed and are negative.    Allergies  Haldol; Ativan; and Naproxen  Home Medications    Current Outpatient Rx  Name  Route  Sig  Dispense  Refill  . albuterol (VENTOLIN HFA) 108 (90 BASE) MCG/ACT inhaler   Inhalation   Inhale 2 puffs into the lungs every 6 (six) hours as needed for wheezing or shortness of breath.   18 g   3   . amitriptyline (ELAVIL) 50 MG tablet      TAKE ONE TABLET AT BEDTIME   30 tablet   1   . amoxicillin-clavulanate (AUGMENTIN) 875-125 MG per tablet               . aspirin 81 MG chewable tablet   Oral   Chew 1 tablet (81 mg total) by mouth daily. For blood thinner.   30 tablet   0   . atorvastatin (LIPITOR) 20 MG tablet   Oral   Take 1 tablet (20 mg total) by mouth daily.   90 tablet   3   . Blood Pressure Monitoring (SPHYGMOMANOMETER) MISC   Does not apply   1 Device by Does not apply route once.   1 each   0   . buPROPion (WELLBUTRIN SR) 150 MG 12 hr tablet   Oral   Take 1 tablet (150 mg total) by mouth daily with breakfast.   30 tablet   2   . Cholecalciferol 2000 UNITS TABS   Oral   Take 1 tablet (2,000 Units total) by mouth daily with breakfast. Nutritional supplement.   30 tablet   0   . EXPIRED: dexlansoprazole (DEXILANT) 60 MG capsule      1 PO EVERY MORNING   31 capsule   11   . divalproex (DEPAKOTE ER) 500 MG 24 hr tablet      Take by mouth 3 at night. On occasions may take one during the day for extreme stress.   100 tablet   2   . donepezil (ARICEPT) 10 MG tablet   Oral   Take 1 tablet (10 mg total) by mouth daily with breakfast.   30 tablet   2   . DULoxetine (CYMBALTA) 60 MG capsule   Oral   Take 1 capsule (60 mg total) by mouth 2 (two) times daily.   60 capsule   3   . feeding supplement (ENSURE COMPLETE) LIQD   Oral   Take 237 mLs by mouth daily  at 8 pm. As a nutritional supplement.   30 Bottle   0   . fluticasone (FLONASE) 50 MCG/ACT nasal spray   Nasal   Place 1 spray into the nose 2 (two) times daily.   16 g   6   . furosemide (LASIX) 20 MG tablet   Oral   Take 1.5  tablets (30 mg total) by mouth daily. For blood pressure control.   45 tablet   0   . gabapentin (NEURONTIN) 100 MG capsule      Take by mouth start with 1 three times a day, may take 2 at bed for sleep, then can increase to 2 three times a day and 2-4 at night.   130 capsule   2   . levothyroxine (SYNTHROID, LEVOTHROID) 125 MCG tablet   Oral   Take 1 tablet (125 mcg total) by mouth daily before breakfast. For hypothyroidism.   30 tablet   0   . Magnesium 200 MG TABS   Oral   Take 1 tablet (200 mg total) by mouth 1 day or 1 dose.   30 each      . Menthol-Methyl Salicylate (GRX ANALGESIC BALM) OINT   Apply externally   Apply 1 application topically 3 (three) times daily at 8am, 2pm and bedtime. For back pain.   410 g   0   . mirtazapine (REMERON) 15 MG tablet   Oral   Take 0.5 tablets (7.5 mg total) by mouth at bedtime.   15 tablet   1   . montelukast (SINGULAIR) 10 MG tablet   Oral   Take 1 tablet (10 mg total) by mouth at bedtime.   30 tablet   3   . Multiple Vitamin (MULTIVITAMIN WITH MINERALS) TABS   Oral   Take 1 tablet by mouth daily. For nutritional supplementation.   30 tablet   0   . nabumetone (RELAFEN) 500 MG tablet               . niacin (NIASPAN) 500 MG CR tablet      TAKE ONE TABLET AT BEDTIME   30 tablet   0     Must be seen before next refill   . nystatin (MYCOSTATIN/NYSTOP) 100000 UNIT/GM POWD   Topical   Apply 1 g topically 3 (three) times daily as needed (redness and moisture).   30 g   0   . omeprazole (PRILOSEC) 20 MG capsule   Oral   Take 1 capsule (20 mg total) by mouth daily.   30 capsule   6   . polyethylene glycol (MIRALAX / GLYCOLAX) packet   Oral   Take 17 g by mouth daily.   14 each   6   . prazosin (MINIPRESS) 2 MG capsule   Oral   Take 3-4 capsules (6-8 mg total) by mouth at bedtime. For insomnia   120 capsule   1   . predniSONE (DELTASONE) 10 MG tablet   Oral   Take 1 tablet (10 mg total) by mouth  daily.   24 tablet   0     40 mg daily for 3 days then 30 mg daily for 2 day ...     BP 128/75  Pulse 110  Temp(Src) 98.4 F (36.9 C) (Oral)  SpO2 98%  Physical Exam  Nursing note and vitals reviewed. Constitutional: She is oriented to person, place, and time. She appears well-developed and well-nourished. No distress.  HENT:  Head: Normocephalic and atraumatic.  Eyes: EOM are normal.  Neck: Normal range of motion.  Cardiovascular: Normal rate, regular rhythm and normal heart sounds.   Pulmonary/Chest: Effort normal and breath sounds normal.  Abdominal: Soft. She exhibits no distension. There is no tenderness.  Musculoskeletal: Normal range of motion.  Neurological: She is alert and oriented to person, place, and time.  Skin: Skin is warm and dry.  Psychiatric: Judgment normal. Her mood appears anxious. Her speech is rapid and/or pressured. She is agitated and hyperactive. She expresses homicidal and suicidal ideation. She is inattentive.    ED Course  Procedures (including critical care time)  Labs Reviewed - No data to display No results found.   No diagnosis found.    MDM  Spoke with the ACT team.  They are aware.  Dr. Tilman Neat nurse is also aware.  The patient will likely need to be admitted a behavior health.  Benzodiazepines now to help with acute agitation.        Lyanne Co, MD 10/24/12 [phone]

## 2012-10-24 NOTE — ED Notes (Signed)
Per pt, history of bipolar disorder, med changes on Tuesday, not able to sleep, increased depression-HI/SI

## 2012-10-25 DIAGNOSIS — F332 Major depressive disorder, recurrent severe without psychotic features: Principal | ICD-10-CM

## 2012-10-25 MED ORDER — TRAZODONE HCL 100 MG PO TABS
100.0000 mg | ORAL_TABLET | Freq: Every evening | ORAL | Status: DC | PRN
  Administered 2012-10-25 – 2012-10-30 (×7): 100 mg via ORAL
  Filled 2012-10-25 (×7): qty 1

## 2012-10-25 MED ORDER — BUPROPION HCL ER (SR) 100 MG PO TB12
200.0000 mg | ORAL_TABLET | Freq: Every day | ORAL | Status: DC
  Administered 2012-10-26 – 2012-10-31 (×6): 200 mg via ORAL
  Filled 2012-10-25 (×9): qty 2

## 2012-10-25 NOTE — Progress Notes (Signed)
Adult Psychoeducational Group Note  Date:  10/25/2012 Time:  11:00 AM  Group Topic/Focus:  Relapse Prevention Planning:   The focus of this group is to define relapse and discuss the need for planning to combat relapse.  Participation Level:  Active  Participation Quality:  Appropriate  Affect:  Appropriate  Cognitive:  Appropriate  Insight: Appropriate  Engagement in Group:  Engaged  Modes of Intervention:  Discussion and Education  Additional Comments:    Barth Kirks 10/25/2012, 12:30 PM
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
## 2012-10-25 NOTE — Progress Notes (Signed)
  D) Patient pleasant and cooperative upon my assessment. Patient completed Patient Self Inventory, reports slept "fair," and  appetite is "good." Patient rates depression as   10/10, patient rates hopeless feelings as  8/10. Patient denies SI/HI, denies A/V hallucinations.   A) Patient offered support and encouragement, patient encouraged to discuss feelings/concerns with staff. Patient verbalized understanding. Patient monitored Q15 minutes for safety. Patient met with MD  to discuss today's goals and plan of care.  R) Patient visible in milieu, attending groups in day room and meals in dining room. Patient appropriate with staff and peers.   Patient taking medications as ordered. Will continue to monitor.

## 2012-10-25 NOTE — Progress Notes (Signed)
D: Patient denies SI/HI/AVH. Patient rates hopelessness as 9,  depression as 10, and anxiety as 9.  Patient affect is sad. Mood is depressed.  Pt states "Everything is happening all at once and I can't deal with it.  It's just everything.  I can't process it all."  Pt forwards little.  Patient did NOT attend evening group. Patient visible on the milieu. No distress noted. A: Support and encouragement offered. Scheduled medications given to pt. Q 15 min checks continued for patient safety. R: Patient receptive. Patient remains safe on the unit.

## 2012-10-25 NOTE — [facility] Suicide Risk Assessment (Signed)
Suicide Risk Assessment  Admission Assessment     Nursing information obtained from:  Patient Demographic factors:  Caucasian;Divorced or widowed;Low socioeconomic status Current Mental Status:  Self-harm thoughts Loss Factors:    Historical Factors:  Prior suicide attempts;Family history of mental illness or substance abuse Risk Reduction Factors:  Sense of responsibility to family;Religious beliefs about death;Living with another person, especially a relative;Positive therapeutic relationship;Positive coping skills or problem solving skills  CLINICAL FACTORS:   Depression:   Anhedonia Hopelessness Impulsivity Insomnia Severe  COGNITIVE FEATURES THAT CONTRIBUTE TO RISK:  Thought constriction (tunnel vision)    SUICIDE RISK:   Mild:  Suicidal ideation of limited frequency, intensity, duration, and specificity.  There are no identifiable plans, no associated intent, mild dysphoria and related symptoms, good self-control (both objective and subjective assessment), few other risk factors, and identifiable protective factors, including available and accessible social support.  PLAN OF CARE: Adjust medications as needed. Provide supportive counselling and education.  I certify that inpatient services furnished can reasonably be expected to improve the patient's condition.  Lakena Sparlin 10/25/2012, 11:15 AM

## 2012-10-25 NOTE — [facility] Group Notes (Signed)
[facility] LCSW Aftercare Discharge Planning Group Note   10/25/2012 8:45 AM  Participation Quality:  Alert and Appropriate   Mood/Affect:  Appropriate  Depression Rating:  10   Anxiety Rating:  9  Thoughts of Suicide:  Pt denies SI/HI  Will you contract for safety?   Yes  Current AVH:  No  Plan for Discharge/Comments:  Pt attended discharge planning group and actively participated in group.  CSW provided pt with today's workbook.  Pt shared she came to the hospital for thoughts of hurting herself and others.  Pt states that living with her sister and niece is stressful.  Pt states that she sees Dr. Dan Humphreys outpatient in [facility] for medication management.  Pt states that she doesn't see a therapist due to finances.  Pt states that she can go back home.  No further needs voiced by pt at this time.    Transportation Means: Pt states that her sister will pick her up upon d/c  Supports: Pt states that her sister is supportive despite living situation being stressful  Allison Sharp, LCSWA 10/25/2012 9:54 AM

## 2012-10-25 NOTE — Tx Team (Signed)
Interdisciplinary Treatment Plan Update (Adult)  Date: 10/25/2012  Time Reviewed:  9:45 AM  Progress in Treatment: Attending groups: Yes Participating in groups:  Yes Taking medication as prescribed:  Yes Tolerating medication:  Yes Family/Significant othe contact made: CSW assessing Patient understands diagnosis:  Yes Discussing patient identified problems/goals with staff:  Yes Medical problems stabilized or resolved:  Yes Denies suicidal/homicidal ideation: Yes Issues/concerns per patient self-inventory:  Yes Other:  New problem(s) identified: N/A  Discharge Plan or Barriers: CSW assessing for appropriate follow up for pt.    Reason for Continuation of Hospitalization: Anxiety Depression Medication Stabilization  Comments: N/A  Estimated length of stay: 3-5 days  For review of initial/current patient goals, please see plan of care.  Attendees: Patient:     Family:     Physician:  Dr. Daleen Bo 10/25/2012 8:34 AM   Nursing:   Merrily Brittle, RN 10/25/2012 8:34 AM   Clinical Social Worker:  Reyes Ivan, LCSWA 10/25/2012 8:34 AM   Other:  Nestor Ramp, RN 10/25/2012 8:34 AM   Other:  Fransisca Kaufmann, NP 10/25/2012 8:34 AM  Other:  Juline Patch, LCSW 10/25/2012 8:34 AM  Other:     Other:    Other:    Other:    Other:    Other:    Other:     Scribe for Treatment Team:   Carmina Miller, 10/25/2012 8:34 AM

## 2012-10-25 NOTE — Progress Notes (Signed)
The focus of this group is to help patients review their daily goal of treatment and discuss progress on daily workbooks.  During wrap up group, Allison Sharp shared that she had a good day that seemed to go downhill. Patient stated that she was proud of herself for utilizing a new skill, which is asking others for help. Patient stated that she is normally who others go to, and that asking for help is new for her.

## 2012-10-26 MED ORDER — NIACIN ER 500 MG PO CPCR
500.0000 mg | ORAL_CAPSULE | Freq: Every day | ORAL | Status: DC
  Filled 2012-10-26 (×2): qty 1

## 2012-10-26 MED ORDER — RISPERIDONE 0.5 MG PO TABS
0.5000 mg | ORAL_TABLET | Freq: Two times a day (BID) | ORAL | Status: DC
  Administered 2012-10-26 – 2012-10-28 (×4): 0.5 mg via ORAL
  Filled 2012-10-26 (×6): qty 1

## 2012-10-26 MED ORDER — POTASSIUM CHLORIDE CRYS ER 10 MEQ PO TBCR
10.0000 meq | EXTENDED_RELEASE_TABLET | Freq: Every day | ORAL | Status: DC
  Administered 2012-10-26 – 2012-10-31 (×6): 10 meq via ORAL
  Filled 2012-10-26 (×8): qty 1

## 2012-10-26 MED ORDER — NIACIN ER 500 MG PO TBCR
500.0000 mg | EXTENDED_RELEASE_TABLET | Freq: Every day | ORAL | Status: DC
  Administered 2012-10-26 – 2012-10-30 (×5): 500 mg via ORAL
  Filled 2012-10-26 (×7): qty 1

## 2012-10-26 MED ORDER — DIAZEPAM 2 MG PO TABS
2.0000 mg | ORAL_TABLET | Freq: Once | ORAL | Status: AC
  Administered 2012-10-26: 2 mg via ORAL
  Filled 2012-10-26: qty 1

## 2012-10-26 MED ORDER — FUROSEMIDE 20 MG PO TABS
30.0000 mg | ORAL_TABLET | Freq: Every day | ORAL | Status: DC
  Administered 2012-10-26 – 2012-10-31 (×6): 30 mg via ORAL
  Filled 2012-10-26 (×8): qty 1.5

## 2012-10-26 MED ORDER — VERAPAMIL HCL ER 180 MG PO TBCR
180.0000 mg | EXTENDED_RELEASE_TABLET | Freq: Every day | ORAL | Status: DC
  Administered 2012-10-26 – 2012-10-31 (×6): 180 mg via ORAL
  Filled 2012-10-26 (×8): qty 1

## 2012-10-26 NOTE — Progress Notes (Signed)
[facility] MD Progress Note  10/26/2012 11:51 AM Allison Sharp  MRN:  [id number] Subjective:  10/10 depression and 9/10 anxiety, complaining of agitation--Risperdal 0.5 mg BID started, she still had thoughts of wanting to "strangle someone" this morning--no one in particular.  Her blood pressure was elevated and her pharmacy called and her cardiac medications verified since she could not remember them and reinstated after verification (see below in note).  Sleep improved greatly with the Trazodone last night and repeat x 1 dose added PRN, appetite "good", her sister and niece are supportive--should visit later.  Diagnosis:   Axis I: Anxiety Disorder NOS and Bipolar, Depressed Axis II: Deferred Axis III:  Past Medical History  Diagnosis Date  . Hyperlipidemia   . CAD (coronary artery disease)   . Bipolar 1 disorder   . Schizophrenia   . GERD (gastroesophageal reflux disease)   . Hyperthyroidism   . Chronic back pain   . Dyspnea     PFT 03/05/09 FEV1 2.77(98%), FVC 3.25(86%), FEV1% 85, TLC 5.88(99%), DLCO 60% ,  Methacholine challenge 03/16/09 normal ,  CT chest 03/12/09 no pulmonary disease  . Anxiety   . Arthritis   . Depression   . OSA on CPAP     2 liters  . HTN (hypertension)   . History of colonoscopy 10/17/2002    by Dr Rehman-> distal non-specific proctitis, small ext hemorrhoids,   . Fungal infection   . Cellulitis   . Contusion of sacrum   . Migraine headache   . Vitamin D deficiency   . Sleep apnea   . Myocardial infarction     NOV 1997  . Hypothyroidism     States she only has hyperthyroidism  . Complication of anesthesia     States she typically gets sick s/p anesthesia  . Morbid obesity with body mass index of 50.0-59.9 in adult JAN 2011 370 LBS    2004 311 BMI 45.9  . Obsessive-compulsive disorder   . PTSD (post-traumatic stress disorder)   . Asthma   . Allergy   . Urine incontinence   . Chronic headaches   . Suicidal ideation   . COPD (chronic obstructive  pulmonary disease)    Axis IV: other psychosocial or environmental problems, problems related to social environment and problems with primary support group Axis V: 41-50 serious symptoms  ADL's:  Intact  Sleep: Good  Appetite:  Good  Suicidal Ideation:  Denies Homicidal Ideation:  Plan:  None Intent:  None Means:  None AEB (as evidenced by):  Psychiatric Specialty Exam: Review of Systems  Constitutional: Negative.   HENT: Negative.   Eyes: Negative.   Respiratory: Negative.   Cardiovascular: Negative.   Gastrointestinal: Negative.   Genitourinary: Negative.   Musculoskeletal: Positive for back pain.  Skin: Negative.   Neurological: Negative.   Endo/Heme/Allergies: Negative.   Psychiatric/Behavioral: Positive for depression. The patient is nervous/anxious.     Blood pressure 152/86, pulse 128, temperature 98.2 F (36.8 C), temperature source Oral, resp. rate 20, weight 167.831 kg (370 lb), last menstrual period 02/18/1995.Body mass index is 55.82 kg/(m^2).  General Appearance: Casual  Eye Contact::  Good  Speech:  Normal Rate  Volume:  Normal  Mood:  Anxious and Depressed  Affect:  Congruent  Thought Process:  Coherent  Orientation:  Full (Time, Place, and Person)  Thought Content:  WDL  Suicidal Thoughts:  No  Homicidal Thoughts:  Yes.  without intent/plan  Memory:  Immediate;   Fair Recent;   Fair Remote;  Fair  Judgement:  Fair  Insight:  Fair  Psychomotor Activity:  Increased  Concentration:  Fair  Recall:  Fair  Akathisia:  No  Handed:  Right  AIMS (if indicated):     Assets:  Communication Skills Resilience Social Support  Sleep:  Number of Hours: 6.75   Current Medications: Current Facility-Administered Medications  Medication Dose Route Frequency Provider Last Rate Last Dose  . acetaminophen (TYLENOL) tablet 650 mg  650 mg Oral Q6H PRN Shuvon Rankin, NP      . albuterol (PROVENTIL HFA;VENTOLIN HFA) 108 (90 BASE) MCG/ACT inhaler 2 puff  2 puff  Inhalation Q6H PRN Shuvon Rankin, NP   2 puff at 10/26/12 0852  . alum & mag hydroxide-simeth (MAALOX/MYLANTA) 200-200-20 MG/5ML suspension 30 mL  30 mL Oral Q4H PRN Shuvon Rankin, NP      . amitriptyline (ELAVIL) tablet 50 mg  50 mg Oral QHS Shuvon Rankin, NP   50 mg at 10/25/12 2104  . aspirin chewable tablet 81 mg  81 mg Oral Daily Shuvon Rankin, NP   81 mg at 10/26/12 0853  . atorvastatin (LIPITOR) tablet 20 mg  20 mg Oral Daily Shuvon Rankin, NP   20 mg at 10/26/12 0853  . [facility] SR) 12 hr tablet 200 mg  200 mg Oral Q breakfast Himabindu Ravi, MD   200 mg at 10/26/12 0852  . diazepam (VALIUM) tablet 2 mg  2 mg Oral Once Nanine Means, NP      . divalproex (DEPAKOTE ER) 24 hr tablet 1,500 mg  1,500 mg Oral QHS Shuvon Rankin, NP   1,500 mg at 10/25/12 2103  . donepezil (ARICEPT) tablet 10 mg  10 mg Oral Q breakfast Shuvon Rankin, NP   10 mg at 10/26/12 0853  . DULoxetine (CYMBALTA) DR capsule 60 mg  60 mg Oral BID Shuvon Rankin, NP   60 mg at 10/26/12 0853  . fluticasone (FLONASE) 50 MCG/ACT nasal spray 1 spray  1 spray Each Nare BID Shuvon Rankin, NP   1 spray at 10/26/12 0852  . furosemide (LASIX) tablet 30 mg  30 mg Oral Daily Nanine Means, NP      . levothyroxine (SYNTHROID, LEVOTHROID) tablet 125 mcg  125 mcg Oral QAC breakfast Shuvon Rankin, NP   125 mcg at 10/26/12 0618  . magnesium hydroxide (MILK OF MAGNESIA) suspension 30 mL  30 mL Oral Daily PRN Shuvon Rankin, NP      . montelukast (SINGULAIR) tablet 10 mg  10 mg Oral QHS Shuvon Rankin, NP   10 mg at 10/25/12 2104  . multivitamin with minerals tablet 1 tablet  1 tablet Oral Daily Shuvon Rankin, NP   1 tablet at 10/26/12 0853  . niacin CR capsule 500 mg  500 mg Oral QHS Nanine Means, NP      . pantoprazole (PROTONIX) EC tablet 40 mg  40 mg Oral Daily Shuvon Rankin, NP   40 mg at 10/26/12 0853  . polyethylene glycol (MIRALAX / GLYCOLAX) packet 17 g  17 g Oral Daily Shuvon Rankin, NP   17 g at 10/26/12 0853  . potassium  chloride (K-DUR,KLOR-CON) CR tablet 10 mEq  10 mEq Oral Daily Nanine Means, NP      . risperiDONE (RISPERDAL) tablet 0.5 mg  0.5 mg Oral BID Nanine Means, NP      . traZODone (DESYREL) tablet 100 mg  100 mg Oral QHS PRN Nanine Means, NP   100 mg at 10/25/12 2103  . verapamil (CALAN-SR) CR tablet  180 mg  180 mg Oral Daily Nanine Means, NP        Lab Results: No results found for this or any previous visit (from the past 48 hour(s)).  Physical Findings: AIMS: Facial and Oral Movements Muscles of Facial Expression: None, normal Lips and Perioral Area: None, normal Jaw: None, normal Tongue: None, normal,Extremity Movements Upper (arms, wrists, hands, fingers): None, normal Lower (legs, knees, ankles, toes): None, normal, Trunk Movements Neck, shoulders, hips: None, normal, Overall Severity Severity of abnormal movements (highest score from questions above): None, normal Incapacitation due to abnormal movements: None, normal Patient's awareness of abnormal movements (rate only patient's report): No Awareness, Dental Status Current problems with teeth and/or dentures?: No Does patient usually wear dentures?: No  CIWA:    COWS:     Treatment Plan Summary: Daily contact with patient to assess and evaluate symptoms and progress in treatment Medication management  Plan:  Review of chart, vital signs, medications, and notes. 1-Individual and group therapy 2-Medication management for depression and anxiety:  Medications reviewed with the patient and she stated agitation--Risperdal 0.5 mg BID added with a one time dose of Valium 2 mg (worked for her in the ED), prednisone was cancelled due to her only taking it last month for acute asthma issues for one week.  Blood pressure elevated along with pulse--patient could not remember her cardiac medications so her pharmacy was called and medications verified---Lasix 30 mg daily for edema and HTN started with potassium 10 mEq for hypokalemic effects of  Lasix, Verapamil SR 180 mg daily HTN, Niacin CR 500 mg at bedtime for hypercholesteremia, and Lipitor 20 mg for hyperlipidemia---these medications added after pharmacist verification. 3-Coping skills for anxiety, chronic mental illness, and agitation 4-Continue crisis stabilization and management 5-Address health issues--monitoring vital signs, blood pressure elevated (see above to medications) 6-Treatment plan in progress to prevent relapse of mood instability and agitation with HI  Medical Decision Making Problem Points:  Established problem, stable/improving (1) and Review of psycho-social stressors (1) Data Points:  Review of new medications or change in dosage (2)  I certify that inpatient services furnished can reasonably be expected to improve the patient's condition.   Nanine Means, PMH-NP 10/26/2012, 11:51 AM I agreed for the finding and involve in the treatment plan.

## 2012-10-26 NOTE — Progress Notes (Signed)
.  Psychoeducational Group Note    Date: 10/26/2012 Time:  0900   Goal Setting Purpose of Group: To be able to set a goal that is measurable and that can be accomplished in one day Participation Level:  Active  Participation Quality:  Appropriate  Affect:  Appropriate  Cognitive:  Alert  Insight:  Improving  Engagement in Group:  Engaged  Additional Comments:  Sherene Plancarte A 

## 2012-10-26 NOTE — Progress Notes (Signed)
Psychoeducational Group Note  Date: 10/26/2012 Time:  1015  Group Topic/Focus:  Identifying Needs:   The focus of this group is to help patients identify their personal needs that have been historically problematic and identify healthy behaviors to address their needs.  Participation Level:  Active  Participation Quality:  Appropriate  Affect:  Appropriate  Cognitive:  Alert  Insight:  Improving  Engagement in Group:  Engaged  Additional Comments:    Indiana Pechacek A  
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
## 2012-10-27 DIAGNOSIS — F410 Panic disorder [episodic paroxysmal anxiety] without agoraphobia: Secondary | ICD-10-CM

## 2012-10-27 DIAGNOSIS — F3130 Bipolar disorder, current episode depressed, mild or moderate severity, unspecified: Secondary | ICD-10-CM

## 2012-10-27 MED ORDER — LORATADINE 10 MG PO TABS
10.0000 mg | ORAL_TABLET | Freq: Every day | ORAL | Status: DC
  Administered 2012-10-28 – 2012-10-31 (×4): 10 mg via ORAL
  Filled 2012-10-27 (×7): qty 1

## 2012-10-27 NOTE — Progress Notes (Signed)
[facility] Group Notes:  (Nursing/MHT/Case Management/Adjunct)  Date:  10/27/2012  Time:  12:07 AM  Type of Therapy:  Group Therapy  Participation Level:  Minimal  Participation Quality:  Appropriate  Affect:  Appropriate  Cognitive:  Appropriate  Insight:  Appropriate  Engagement in Group:  Engaged  Modes of Intervention:  Socialization and Support  Summary of Progress/Problems: Pt. Stated her energy level was a 8 out of 10.  Pt. Stated she was feeling a little better after having a little stress in the morning.  Pt. Stated "peace" makes her whole, and this could be attain by learning to let things go and not loose temper.  Sondra Come 10/27/2012, 12:07 AM

## 2012-10-27 NOTE — [facility] Group Notes (Signed)
[facility] Group Notes:  (Clinical Social Work)  10/27/2012   3:00-4:00PM  Summary of Progress/Problems:   The main focus of today's process group was to   identify the patient's current support system and decide on other supports that can be put in place.  Four definitions/levels of support were discussed.  An emphasis was placed on using counselor, doctor, therapy groups, 12-step groups, and problem-specific support groups to expand supports. The patient stated her family is supportive, and talked about how her sister does not even realize sometimes that the patient will go a few weeks without even leaving the house.  She admitted to the group that the more she does this, the more difficult it is to go outside, especially to go things like support groups.  Type of Therapy:  Process Group  Participation Level:  Active  Participation Quality:  Attentive and Sharing  Affect:  Depressed and Flat  Cognitive:  Appropriate and Oriented  Insight:  Engaged  Engagement in Therapy:  Engaged  Modes of Intervention:  Education,  Support and [employer], LCSW 10/27/2012, 5:11 PM

## 2012-10-27 NOTE — Progress Notes (Signed)
Psychoeducational Group Note  Date:  10/27/2012 Time:  1015  Group Topic/Focus:  Making Healthy Choices:   The focus of this group is to help patients identify negative/unhealthy choices they were using prior to admission and identify positive/healthier coping strategies to replace them upon discharge.  Participation Level:  Active  Participation Quality:  Appropriate  Affect:  Appropriate  Cognitive:  Alert  Insight:  Improving  Engagement in Group:  Engaged  Additional Comments:  Paid attention and participated   Eddye Broxterman A 10/27/2012 

## 2012-10-27 NOTE — Progress Notes (Signed)
D   Allison Sharp feels " a little" more in control today, she says " a little better". She takes her meds as they are ordered by the MD. [facility] is quite attentive in  [employer]  this afternoon , is actively trying to understand  Her feelings and her behavior and is trying to develop healthier coping skills.   A She completes her AM self inventory and on it she writes she denies SI, she rates her depression and hopelessness " 7/7" and she states her depresiion

## 2012-10-27 NOTE — Progress Notes (Signed)
Adult Psychoeducational Group Note  Date:  10/27/2012 Time:  10:15 PM  Group Topic/Focus:  Goals Group:   The focus of this group is to help patients establish daily goals to achieve during treatment and discuss how the patient can incorporate goal setting into their daily lives to aide in recovery.  Participation Level:  None  Participation Quality:  none  Affect:  None   Cognitive:  None  Insight: None  Engagement in Group:  None  Modes of Intervention:None    Additional Comments: Pt knees where hurting and didn't attend group.  Terie Purser R 10/27/2012, 10:15 PM

## 2012-10-27 NOTE — Progress Notes (Signed)
Late entry for 10/26/2012  1500 ,  entered 10/27/2012    D  Allison Sharp remains quite in distress today.[name] states " I've never had rage like this before...". She shares that she is worried about  Staying in control today...that she has never had so much trouble just being with  Other people. She is seen standing in front of this nurse, at the med window and she is encouraged by this nurse, to keep her voice low, to slow her breathing rate down and to think about how good it feels to think positively.   A She  Takes her meds as they are ordered. SHe completes her AM self inventory and on it she writes she denies SI, she rates her depression and hopelessness " 10/9" and states her depression and hopelessness "7/5".   r sAFETY IS IN PLACE AND poc CONT.

## 2012-10-27 NOTE — Progress Notes (Signed)
Patient ID: Allison Sharp, female   DOB: [birth year], 57 y.o.   MRN: [id number] [facility] MD Progress Note  10/27/2012 3:17 PM Allison Sharp  MRN:  [id number] Subjective:  Still depressed but notes that she is better today than yesterday. Reports that her symptoms have worsened over the last month. The only new issue is her Singulair which she has been on for the past month and a half. She notes that she is more irritable, more aggressive and hostile with her family. She is frustrated that hey have moved into their home in January, but there are still boxes everywhere and her family all work during the day, and shis is on disability and stays home. She does not feel able to move the boxes and notes that her family's unwillingness to put things away is making her angery. Diagnosis:   Axis I: Anxiety Disorder NOS and Bipolar, Depressed Axis II: Deferred Axis III:  Past Medical History  Diagnosis Date  . Hyperlipidemia   . CAD (coronary artery disease)   . Bipolar 1 disorder   . Schizophrenia   . GERD (gastroesophageal reflux disease)   . Hyperthyroidism   . Chronic back pain   . Dyspnea     PFT 03/05/09 FEV1 2.77(98%), FVC 3.25(86%), FEV1% 85, TLC 5.88(99%), DLCO 60% ,  Methacholine challenge 03/16/09 normal ,  CT chest 03/12/09 no pulmonary disease  . Anxiety   . Arthritis   . Depression   . OSA on CPAP     2 liters  . HTN (hypertension)   . History of colonoscopy 10/17/2002    by Dr Rehman-> distal non-specific proctitis, small ext hemorrhoids,   . Fungal infection   . Cellulitis   . Contusion of sacrum   . Migraine headache   . Vitamin D deficiency   . Sleep apnea   . Myocardial infarction     NOV 1997  . Hypothyroidism     States she only has hyperthyroidism  . Complication of anesthesia     States she typically gets sick s/p anesthesia  . Morbid obesity with body mass index of 50.0-59.9 in adult JAN 2011 370 LBS    2004 311 BMI 45.9  . Obsessive-compulsive disorder   . PTSD  (post-traumatic stress disorder)   . Asthma   . Allergy   . Urine incontinence   . Chronic headaches   . Suicidal ideation   . COPD (chronic obstructive pulmonary disease)    Axis IV: other psychosocial or environmental problems, problems related to social environment and problems with primary support group Axis V: 41-50 serious symptoms  ADL's:  Intact  Sleep: Good  Appetite:  Good  Suicidal Ideation:  Denies Homicidal Ideation:  Plan:  None Intent:  None Means:  None AEB (as evidenced by): Patient's report of decreased symptoms, improved mood, sleep and appetite.  Psychiatric Specialty Exam: Review of Systems  Constitutional: Negative.   HENT: Negative.   Eyes: Negative.   Respiratory: Negative.   Cardiovascular: Negative.   Gastrointestinal: Negative.   Genitourinary: Negative.   Musculoskeletal: Positive for back pain.  Skin: Negative.   Neurological: Negative.   Endo/Heme/Allergies: Negative.   Psychiatric/Behavioral: Positive for depression. The patient is nervous/anxious.     Blood pressure 115/79, pulse 89, temperature 98.4 F (36.9 C), temperature source Oral, resp. rate 18, weight 167.831 kg (370 lb), last menstrual period 02/18/1995.Body mass index is 55.82 kg/(m^2).  General Appearance: Casual  Eye Contact::  Good  Speech:  Normal Rate  Volume:  Normal  Mood:  Anxious and Depressed  Affect:  Congruent  Thought Process:  Coherent  Orientation:  Full (Time, Place, and Person)  Thought Content:  WDL  Suicidal Thoughts:  No  Homicidal Thoughts:  Yes.  without intent/plan  Memory:  Immediate;   Fair Recent;   Fair Remote;   Fair  Judgement:  Fair  Insight:  Fair  Psychomotor Activity:  Increased  Concentration:  Fair  Recall:  Fair  Akathisia:  No  Handed:  Right  AIMS (if indicated):     Assets:  Communication Skills Resilience Social Support  Sleep:  Number of Hours: 6   Current Medications: Current Facility-Administered Medications   Medication Dose Route Frequency Provider Last Rate Last Dose  . acetaminophen (TYLENOL) tablet 650 mg  650 mg Oral Q6H PRN Shuvon Rankin, NP      . albuterol (PROVENTIL HFA;VENTOLIN HFA) 108 (90 BASE) MCG/ACT inhaler 2 puff  2 puff Inhalation Q6H PRN Shuvon Rankin, NP   2 puff at 10/27/12 0845  . alum & mag hydroxide-simeth (MAALOX/MYLANTA) 200-200-20 MG/5ML suspension 30 mL  30 mL Oral Q4H PRN Shuvon Rankin, NP      . amitriptyline (ELAVIL) tablet 50 mg  50 mg Oral QHS Shuvon Rankin, NP   50 mg at 10/26/12 2141  . aspirin chewable tablet 81 mg  81 mg Oral Daily Shuvon Rankin, NP   81 mg at 10/27/12 0846  . atorvastatin (LIPITOR) tablet 20 mg  20 mg Oral Daily Shuvon Rankin, NP   20 mg at 10/27/12 0845  . buPROPion [employer] SR) 12 hr tablet 200 mg  200 mg Oral Q breakfast Himabindu Ravi, MD   200 mg at 10/27/12 0846  . divalproex (DEPAKOTE ER) 24 hr tablet 1,500 mg  1,500 mg Oral QHS Shuvon Rankin, NP   1,500 mg at 10/26/12 2141  . donepezil (ARICEPT) tablet 10 mg  10 mg Oral Q breakfast Shuvon Rankin, NP   10 mg at 10/27/12 0846  . DULoxetine (CYMBALTA) DR capsule 60 mg  60 mg Oral BID Shuvon Rankin, NP   60 mg at 10/27/12 0846  . fluticasone (FLONASE) 50 MCG/ACT nasal spray 1 spray  1 spray Each Nare BID Shuvon Rankin, NP   1 spray at 10/26/12 0852  . furosemide (LASIX) tablet 30 mg  30 mg Oral Daily Nanine Means, NP   30 mg at 10/27/12 0845  . levothyroxine (SYNTHROID, LEVOTHROID) tablet 125 mcg  125 mcg Oral QAC breakfast Shuvon Rankin, NP   125 mcg at 10/27/12 0626  . magnesium hydroxide (MILK OF MAGNESIA) suspension 30 mL  30 mL Oral Daily PRN Shuvon Rankin, NP      . montelukast (SINGULAIR) tablet 10 mg  10 mg Oral QHS Shuvon Rankin, NP   10 mg at 10/26/12 2141  . multivitamin with minerals tablet 1 tablet  1 tablet Oral Daily Shuvon Rankin, NP   1 tablet at 10/27/12 0800  . niacin (SLO-NIACIN) CR tablet 500 mg  500 mg Oral QHS Himabindu Ravi, MD   500 mg at 10/26/12 2142  .  pantoprazole (PROTONIX) EC tablet 40 mg  40 mg Oral Daily Shuvon Rankin, NP   40 mg at 10/27/12 0846  . polyethylene glycol (MIRALAX / GLYCOLAX) packet 17 g  17 g Oral Daily Shuvon Rankin, NP   17 g at 10/27/12 0844  . potassium chloride (K-DUR,KLOR-CON) CR tablet 10 mEq  10 mEq Oral Daily Nanine Means, NP   10 mEq at 10/27/12 0846  .  risperiDONE (RISPERDAL) tablet 0.5 mg  0.5 mg Oral BID Nanine Means, NP   0.5 mg at 10/27/12 0846  . traZODone (DESYREL) tablet 100 mg  100 mg Oral QHS PRN Nanine Means, NP   100 mg at 10/27/12 0110  . verapamil (CALAN-SR) CR tablet 180 mg  180 mg Oral Daily Nanine Means, NP   180 mg at 10/27/12 1610    Lab Results: No results found for this or any previous visit (from the past 48 hour(s)).  Physical Findings: AIMS: Facial and Oral Movements Muscles of Facial Expression: None, normal Lips and Perioral Area: None, normal Jaw: None, normal Tongue: None, normal,Extremity Movements Upper (arms, wrists, hands, fingers): None, normal Lower (legs, knees, ankles, toes): None, normal, Trunk Movements Neck, shoulders, hips: None, normal, Overall Severity Severity of abnormal movements (highest score from questions above): None, normal Incapacitation due to abnormal movements: None, normal Patient's awareness of abnormal movements (rate only patient's report): No Awareness, Dental Status Current problems with teeth and/or dentures?: No Does patient usually wear dentures?: No  CIWA:    COWS:     Treatment Plan Summary: Daily contact with patient to assess and evaluate symptoms and progress in treatment Medication management  Plan:  Review of chart, vital signs, medications, and notes. 1-Individual and group therapy 2-Medication management for depression and anxiety:  Medications reviewed with the patient and she stated agitation--Risperdal 0.5 mg BID added with a one time dose of Valium 2 mg (worked for her in the ED), prednisone was cancelled due to her only  taking it last month for acute asthma issues for one week.  Blood pressure elevated along with pulse--patient could not remember her cardiac medications so her pharmacy was called and medications verified---Lasix 30 mg daily for edema and HTN started with potassium 10 mEq for hypokalemic effects of Lasix, Verapamil SR 180 mg daily HTN, Niacin CR 500 mg at bedtime for hypercholesteremia, and Lipitor 20 mg for hyperlipidemia---these medications added after pharmacist verification. 3-Coping skills for anxiety, chronic mental illness, and agitation 4-Continue crisis stabilization and management 5-Address health issues--monitoring vital signs, blood pressure elevated (see above to medications) 6-Treatment plan in progress to prevent relapse of mood instability and agitation with HI 7. Will discontinue the Singulair at hs due to incidental reports of aggressive behavior changes. Will add Zyrtec to cover allergies as written. 8. Will continue to monitor for changes at this time, will increase risperdal if symptoms increase. Medical Decision Making Problem Points:  Established problem, stable/improving (1) and Review of psycho-social stressors (1) Data Points:  Review of new medications or change in dosage (2)  I certify that inpatient services furnished can reasonably be expected to improve the patient's condition.  Rona Ravens. [place]burn RPAC 12:14 AM 10/28/2012     I agreed for the finding and involve in the treatment plan.I am agreed with the findings and involve in the treatment plan.

## 2012-10-27 NOTE — Progress Notes (Signed)
Psychoeducational Group Note  Psychoeducational Group Note  Date: 10/27/2012 Time:  10/27/2012  Group Topic/Focus:  Gratefulness:  The focus of this group is to help patients identify what two things they are most grateful for in their lives. What helps ground them and to center them on their work to their recovery.  Participation Level:  Active  Participation Quality:  Appropriate  Affect:  Flat  Cognitive:  Alert  Insight:  Improving  Engagement in Group:  Engaged  Additional Comments: Pt stated "I am grateful for my nephew. He called me yesterday. Even though I was here, he still felt safe to call me and I am grateful for that".

## 2012-10-27 NOTE — Progress Notes (Signed)
Writer spoke with patient 1:1 and she reports that her day started out pretty rough but got better as the day went on. Writer asked was there anything in particular that caused her day to start off bad and she reported that 2 particular patients were in the dayroom this morning and one talked non-stop and the other was cursing every other word. She reports that she returned to her room because it was too much for her. Writer informed patient of her scheduled meds and trazadone available to aid with sleep. Patient is agreeable to medications ordered. Patient denies si/hi/a/v hallucinations, safety maintained on unit, will continue to monitor.

## 2012-10-28 ENCOUNTER — Ambulatory Visit: Payer: Medicare Other | Attending: Physical Therapy | Admitting: Physical Therapy

## 2012-10-28 DIAGNOSIS — F411 Generalized anxiety disorder: Secondary | ICD-10-CM

## 2012-10-28 MED ORDER — GABAPENTIN 100 MG PO CAPS
200.0000 mg | ORAL_CAPSULE | Freq: Three times a day (TID) | ORAL | Status: DC
  Administered 2012-10-28 – 2012-10-31 (×10): 200 mg via ORAL
  Filled 2012-10-28 (×15): qty 2

## 2012-10-28 NOTE — Tx Team (Signed)
Interdisciplinary Treatment Plan Update (Adult)  Date: 10/28/2012  Time Reviewed:  9:45 AM  Progress in Treatment: Attending groups: Yes Participating in groups:  Yes Taking medication as prescribed:  Yes Tolerating medication:  Yes Family/Significant othe contact made: CSW assessing  Patient understands diagnosis:  Yes Discussing patient identified problems/goals with staff:  Yes Medical problems stabilized or resolved:  Yes Denies suicidal/homicidal ideation: Yes Issues/concerns per patient self-inventory:  Yes Other:  New problem(s) identified: N/A  Discharge Plan or Barriers: CSW assessing for appropriate referrals.  Reason for Continuation of Hospitalization: Anxiety Depression Medication Stabilization  Comments: N/A  Estimated length of stay: 3-5 days  For review of initial/current patient goals, please see plan of care.  Attendees: Patient:     Family:     Physician:  Dr. Johnalagadda 10/28/2012 9:59 AM   Nursing:   Beverly Knight, RN 10/28/2012 9:59 AM   Clinical Social Worker:  Jessiah Steinhart Horton, LCSWA 10/28/2012 9:59 AM   Other: Laura Davis, NP 10/28/2012 9:59 AM   Other:  Maseta Dorley, MA care coordination 10/28/2012 9:59 AM   Other:  Andrea Thorne, RN 10/28/2012 10:00 AM   Other:  Quylle Hodnett, LCSW 10/28/2012 10:00 AM   Other:    Other:    Other:    Other:    Other:    Other:     Scribe for Treatment Team:   Horton, Tkai Serfass Nicole, 10/28/2012 9:59 AM  

## 2012-10-28 NOTE — Progress Notes (Signed)
PT screen note:  Note on order that MD wants PT to bring cane to pt for her to use at [facility] due to she uses one at home, however PT cannot leave Rehab depts cane at [facility].  Spoke with RN and they feel they have one there that she can use.  Please re-order if need to assess safety with cane.  PT to sign off at this time.  Thanks, Harriet Butte, PT

## 2012-10-28 NOTE — Progress Notes (Signed)
Recreation Therapy Notes  Date: 06.02.2014 Time: 3:00pm Location: [address] Dayroom      Group Topic/Focus: Leisure Education  Participation Level: Active  Participation Quality: Appropriate  Affect: Euthymic  Cognitive: Appropriate   Additional Comments: Activity: Healthy Eating & Fitness Trivia ; Explanation: Patients were asked to divide themselves into three teams. Teams were asked questions from one of the following five categories: Fruits, Fitness, Veggies, Exercises, Producer, television/film/video. Patient teams were awarded one point for each correct answer.   Patient actively participated in group activity. Patient worked well with her team mates. Patient listened to wrap up discussion about the benefits of exercise to whole wellness.   Marykay Lex Foy Vanduyne, LRT/CTRS  Jearl Klinefelter 10/28/2012 4:44 PM

## 2012-10-28 NOTE — Progress Notes (Signed)
Adult Psychoeducational Group Note  Date:  10/28/2012 Time:  11:00AM Group Topic/Focus:  Wellness Toolbox:   The focus of this group is to discuss various aspects of wellness, balancing those aspects and exploring ways to increase the ability to experience wellness.  Patients will create a wellness toolbox for use upon discharge.  Participation Level:  Active  Participation Quality:  Appropriate and Attentive  Affect:  Appropriate  Cognitive:  Alert and Appropriate  Insight: Appropriate  Engagement in Group:  Engaged  Modes of Intervention:  Discussion  Additional Comments:  Pt. Was attentive and appropriate during today's group discussion. Pt. Was able to complete Wellness Self Care Assessment. Pt. Shared that she has no problem with telling people NO and not feeling guilty about it. Pt. However did share on areas she need improvement on. Pt. Stated that she is willing to start writing in a journal.   Bing Plume D 10/28/2012, 1:23 PM

## 2012-10-28 NOTE — Progress Notes (Signed)
D:  Patient's self inventory sheet, patient has fair sleep, good appetite, low energy level, improving attention span.  Rated depression and hopelessness #7.  Denied withdrawals.  Denied SI.  Has pain in knees and swollen hands.  Unsure of pain goal.  Worst pain #8.   "Want to ask for help and speak my mind.  Pain cream for hands and knees."  No problems for taking medications after discharge. A:  Medications administered per MD orders.  Emotional support and encouragement given to patient. R:  Denied SI and HI.  Denied A/V hallucinations.  Will continue with 15 minute checks for safety.  Safety maintained.   Patient will be evaluated by PT, requests cane.   Would like to discuss neurotin tid that she takes at home.

## 2012-10-28 NOTE — Progress Notes (Signed)
Patient ID: Allison Sharp, female   DOB: February 25, 1956, 57 y.o.   MRN: [id number] D: Pt. Reports she's here because "about to rage and having SH thoughts" Pt. Denies any thoughts at this time. Pt. Reports depression at "7" of 10. Pt. Uses a cane for chronic back and leg pain. A: Writer provided emotional support. Pt. Will be monitored q55min for safety. Staff encouraged group. R: Pt. Is safe on the unit. Pt. Attended group.

## 2012-10-28 NOTE — [facility] Group Notes (Signed)
[facility] LCSW Aftercare Discharge Planning Group Note   10/28/2012 8:45 AM  Participation Quality:  Did Not Attend  Allison Sharp, LCSWA 10/28/2012 10:19 AM

## 2012-10-28 NOTE — [facility] Group Notes (Signed)
[facility] LCSW Group Therapy  10/28/2012  1:15 PM   Type of Therapy:  Group Therapy  Participation Level:  Active  Participation Quality:  Appropriate and Attentive  Affect:  Appropriate  Cognitive:  Alert and Appropriate  Insight:  Developing/Improving and Engaged  Engagement in Therapy:  Developing/Improving and Engaged  Modes of Intervention:  Clarification, Confrontation, Discussion, Education, Exploration, Limit-setting, Orientation, Problem-solving, Rapport Building, Dance movement psychotherapist, Socialization and Support  Summary of Progress/Problems: The topic for group today was overcoming obstacles.  Pt discussed overcoming obstacles and what this means for pt. Pt shared that her biggest obstacle is lowering her expectations for others.  Pt explained that she has high expectations for others and goes into a rage when these expectations are not met to her standards; pt states that this is her OCD.  Pt states that she is actively working on this now and has been tested on the unit and feels she is handling her anger well here.  Pt actively participated to group discussion.    Reyes Ivan, [place] 10/28/2012 2:58 PM

## 2012-10-28 NOTE — Tx Team (Signed)
Interdisciplinary Treatment Plan Update (Adult)  Date: 10/28/2012  Time Reviewed:  9:45 AM  Progress in Treatment: Attending groups: Yes Participating in groups:  Yes Taking medication as prescribed:  Yes Tolerating medication:  Yes Family/Significant othe contact made: CSW assessing  Patient understands diagnosis:  Yes Discussing patient identified problems/goals with staff:  Yes Medical problems stabilized or resolved:  Yes Denies suicidal/homicidal ideation: Yes Issues/concerns per patient self-inventory:  Yes Other:  New problem(s) identified: N/A  Discharge Plan or Barriers: CSW assessing for appropriate referrals.  Reason for Continuation of Hospitalization: Anxiety Depression Medication Stabilization  Comments: N/A  Estimated length of stay: 3-5 days  For review of initial/current patient goals, please see plan of care.  Attendees: Patient:     Family:     Physician:  Dr. Johnalagadda 10/28/2012 9:59 AM   Nursing:   Beverly Knight, RN 10/28/2012 9:59 AM   Clinical Social Worker:  Shavar Gorka Horton, LCSWA 10/28/2012 9:59 AM   Other: Laura Davis, NP 10/28/2012 9:59 AM   Other:  Maseta Dorley, MA care coordination 10/28/2012 9:59 AM   Other:  Andrea Thorne, RN 10/28/2012 10:00 AM   Other:  Quylle Hodnett, LCSW 10/28/2012 10:00 AM   Other:    Other:    Other:    Other:    Other:    Other:     Scribe for Treatment Team:   Horton, Akansha Wyche Nicole, 10/28/2012 9:59 AM  

## 2012-10-28 NOTE — Progress Notes (Signed)
[facility] MD Progress Note  10/28/2012 12:45 PM Allison Sharp  MRN:  [id number]  Subjective:  Patient stated that she continue to feel stress, depressed, suicidal and homicidal thoughts. She is worried about Dr. Dan Humphreys leaving and unable to find the provider next month. She would like to follow up with Dr. Lolly Mustache. She has been participating on unit activities and feels she is learning coping skills to deal with her depression and stress. Reports that her symptoms have worsened over the last month. She notes that she is more irritable, more aggressive and hostile with her family who moved into their home in January, but there are still boxes everywhere and her family all work during the day, and she is on disability and stays home. She does not feel able to move the boxes and notes that her family's unwillingness to put things away is making her angry. She has been not steady on her feet and asks for a cane.   Diagnosis:   Axis I: Anxiety Disorder NOS and Bipolar, Depressed Axis II: Deferred Axis III:  Past Medical History  Diagnosis Date  . Hyperlipidemia   . CAD (coronary artery disease)   . Bipolar 1 disorder   . Schizophrenia   . GERD (gastroesophageal reflux disease)   . Hyperthyroidism   . Chronic back pain   . Dyspnea     PFT 03/05/09 FEV1 2.77(98%), FVC 3.25(86%), FEV1% 85, TLC 5.88(99%), DLCO 60% ,  Methacholine challenge 03/16/09 normal ,  CT chest 03/12/09 no pulmonary disease  . Anxiety   . Arthritis   . Depression   . OSA on CPAP     2 liters  . HTN (hypertension)   . History of colonoscopy 10/17/2002    by Dr Rehman-> distal non-specific proctitis, small ext hemorrhoids,   . Fungal infection   . Cellulitis   . Contusion of sacrum   . Migraine headache   . Vitamin D deficiency   . Sleep apnea   . Myocardial infarction     NOV 1997  . Hypothyroidism     States she only has hyperthyroidism  . Complication of anesthesia     States she typically gets sick s/p anesthesia  .  Morbid obesity with body mass index of 50.0-59.9 in adult JAN 2011 370 LBS    2004 311 BMI 45.9  . Obsessive-compulsive disorder   . PTSD (post-traumatic stress disorder)   . Asthma   . Allergy   . Urine incontinence   . Chronic headaches   . Suicidal ideation   . COPD (chronic obstructive pulmonary disease)    Axis IV: other psychosocial or environmental problems, problems related to social environment and problems with primary support group Axis V: 41-50 serious symptoms  ADL's:  Intact  Sleep: Good  Appetite:  Good  Suicidal Ideation:  Denies Homicidal Ideation:  Plan:  None Intent:  None Means:  None AEB (as evidenced by): Patient's report of decreased symptoms, improved mood, sleep and appetite.  Psychiatric Specialty Exam: Review of Systems  Constitutional: Negative.   HENT: Negative.   Eyes: Negative.   Respiratory: Negative.   Cardiovascular: Negative.   Gastrointestinal: Negative.   Genitourinary: Negative.   Musculoskeletal: Positive for back pain.  Skin: Negative.   Neurological: Negative.   Endo/Heme/Allergies: Negative.   Psychiatric/Behavioral: Positive for depression. The patient is nervous/anxious.     Blood pressure 110/74, pulse 98, temperature 97.9 F (36.6 C), temperature source Oral, resp. rate 20, weight 370 lb (167.831 kg), last menstrual  period 02/18/1995.Body mass index is 55.82 kg/(m^2).  General Appearance: Casual  Eye Contact::  Good  Speech:  Normal Rate  Volume:  Normal  Mood:  Anxious and Depressed  Affect:  Congruent  Thought Process:  Coherent  Orientation:  Full (Time, Place, and Person)  Thought Content:  WDL  Suicidal Thoughts:  No  Homicidal Thoughts:  Yes.  without intent/plan  Memory:  Immediate;   Fair Recent;   Fair Remote;   Fair  Judgement:  Fair  Insight:  Fair  Psychomotor Activity:  Increased  Concentration:  Fair  Recall:  Fair  Akathisia:  No  Handed:  Right  AIMS (if indicated):     Assets:   Communication Skills Resilience Social Support  Sleep:  Number of Hours: 6   Current Medications: Current Facility-Administered Medications  Medication Dose Route Frequency Provider Last Rate Last Dose  . acetaminophen (TYLENOL) tablet 650 mg  650 mg Oral Q6H PRN Shuvon Rankin, NP   650 mg at 10/28/12 0941  . albuterol (PROVENTIL HFA;VENTOLIN HFA) 108 (90 BASE) MCG/ACT inhaler 2 puff  2 puff Inhalation Q6H PRN Shuvon Rankin, NP   2 puff at 10/28/12 0609  . alum & mag hydroxide-simeth (MAALOX/MYLANTA) 200-200-20 MG/5ML suspension 30 mL  30 mL Oral Q4H PRN Shuvon Rankin, NP      . amitriptyline (ELAVIL) tablet 50 mg  50 mg Oral QHS Shuvon Rankin, NP   50 mg at 10/27/12 2148  . aspirin chewable tablet 81 mg  81 mg Oral Daily Shuvon Rankin, NP   81 mg at 10/28/12 0826  . atorvastatin (LIPITOR) tablet 20 mg  20 mg Oral Daily Shuvon Rankin, NP   20 mg at 10/28/12 0827  . buPROPion [facility] SR) 12 hr tablet 200 mg  200 mg Oral Q breakfast Himabindu Ravi, MD   200 mg at 10/28/12 0827  . divalproex (DEPAKOTE ER) 24 hr tablet 1,500 mg  1,500 mg Oral QHS Shuvon Rankin, NP   1,500 mg at 10/27/12 2148  . donepezil (ARICEPT) tablet 10 mg  10 mg Oral Q breakfast Shuvon Rankin, NP   10 mg at 10/28/12 0828  . DULoxetine (CYMBALTA) DR capsule 60 mg  60 mg Oral BID Shuvon Rankin, NP   60 mg at 10/28/12 0828  . fluticasone (FLONASE) 50 MCG/ACT nasal spray 1 spray  1 spray Each Nare BID Shuvon Rankin, NP   1 spray at 10/28/12 0829  . furosemide (LASIX) tablet 30 mg  30 mg Oral Daily Nanine Means, NP   30 mg at 10/28/12 0829  . levothyroxine (SYNTHROID, LEVOTHROID) tablet 125 mcg  125 mcg Oral QAC breakfast Shuvon Rankin, NP   125 mcg at 10/28/12 0609  . loratadine (CLARITIN) tablet 10 mg  10 mg Oral Daily Verne Spurr, PA-C   10 mg at 10/28/12 0830  . magnesium hydroxide (MILK OF MAGNESIA) suspension 30 mL  30 mL Oral Daily PRN Shuvon Rankin, NP      . multivitamin with minerals tablet 1 tablet  1 tablet Oral  Daily Shuvon Rankin, NP   1 tablet at 10/28/12 0830  . niacin (SLO-NIACIN) CR tablet 500 mg  500 mg Oral QHS Himabindu Ravi, MD   500 mg at 10/27/12 2148  . pantoprazole (PROTONIX) EC tablet 40 mg  40 mg Oral Daily Shuvon Rankin, NP   40 mg at 10/28/12 0830  . polyethylene glycol (MIRALAX / GLYCOLAX) packet 17 g  17 g Oral Daily Shuvon Rankin, NP   17 g at  10/28/12 1151  . potassium chloride (K-DUR,KLOR-CON) CR tablet 10 mEq  10 mEq Oral Daily Nanine Means, NP   10 mEq at 10/28/12 0831  . risperiDONE (RISPERDAL) tablet 0.5 mg  0.5 mg Oral BID Nanine Means, NP   0.5 mg at 10/28/12 0831  . traZODone (DESYREL) tablet 100 mg  100 mg Oral QHS PRN Nanine Means, NP   100 mg at 10/27/12 2149  . verapamil (CALAN-SR) CR tablet 180 mg  180 mg Oral Daily Nanine Means, NP   180 mg at 10/28/12 1610    Lab Results: No results found for this or any previous visit (from the past 48 hour(s)).  Physical Findings: AIMS: Facial and Oral Movements Muscles of Facial Expression: None, normal Lips and Perioral Area: None, normal Jaw: None, normal Tongue: None, normal,Extremity Movements Upper (arms, wrists, hands, fingers): None, normal Lower (legs, knees, ankles, toes): None, normal, Trunk Movements Neck, shoulders, hips: None, normal, Overall Severity Severity of abnormal movements (highest score from questions above): None, normal Incapacitation due to abnormal movements: None, normal Patient's awareness of abnormal movements (rate only patient's report): No Awareness, Dental Status Current problems with teeth and/or dentures?: No Does patient usually wear dentures?: No  CIWA:  CIWA-Ar Total: 1 COWS:  COWS Total Score: 2  Treatment Plan Summary: Daily contact with patient to assess and evaluate symptoms and progress in treatment Medication management  Plan:  Review of chart, vital signs, medications, and notes. 1-Individual and group therapy 2-Medication management for depression and anxiety:    Medications reviewed with the patient -Start neurontin 200 mg TID and discontinue.  Blood pressure elevated along with pulse--patient could not remember her cardiac medications so her pharmacy was called and medications verified---Lasix 30 mg daily for edema and HTN started with potassium 10 mEq for hypokalemic effects of Lasix, Verapamil SR 180 mg daily HTN, Niacin CR 500 mg at bedtime for hypercholesteremia, and Lipitor 20 mg for hyperlipidemia---these medications added after pharmacist verification.  3-Coping skills for anxiety, chronic mental illness, and agitation 4-Continue crisis stabilization and management 5-Address health issues--monitoring vital signs, blood pressure elevated 6-Treatment plan in progress to prevent relapse of mood instability and agitation with HI 7. Continue Zyrtec to cover allergies as written. 8. Will continue to monitor for changes at this time, will increase risperdal if symptoms increase.  Medical Decision Making Problem Points:  Established problem, stable/improving (1) and Review of psycho-social stressors (1) Data Points:  Review of new medications or change in dosage (2)  I certify that inpatient services furnished can reasonably be expected to improve the patient's condition.     I agreed for the finding and involve in the treatment plan.I am agreed with the findings and involve in the treatment plan.

## 2012-10-28 NOTE — Progress Notes (Signed)
Writer spoke with patient at medication window and she requested her inhaler which she received. Patient reports having had a good day which was better than her day on yesterday but she did c/o her knees hurting her and was given tylenol for pain she rated an 8 along with cold packs. Patient inquired about the changes to her medications and was informed of what medications to expect in the am. Patient was observed earlier sitting in the dayroom watching tv and talking with peers. Patient did not plan on attending group d/t her knees hurting. Support and encouragement offered, safety maintained on unit, will continue to monitor.

## 2012-10-28 NOTE — Progress Notes (Signed)
Adult Psychoeducational Group Note  Date:  10/28/2012 Time:  8:00PM Group Topic/Focus:  Wrap-Up Group:   The focus of this group is to help patients review their daily goal of treatment and discuss progress on daily workbooks.  Participation Level:  Active  Participation Quality:  Appropriate and Attentive  Affect:  Appropriate  Cognitive:  Alert and Appropriate  Insight: Appropriate  Engagement in Group:  Engaged  Modes of Intervention:  Discussion  Additional Comments:  Pt. Was attentive and appropriate during tonight's group discussion. Pt. Stated that she is doing a lot better now that she has received a cane to assist her with getting around better. Pt shared that she is had a good day today. Pt. Enjoyed the Wellness self Care assessment and was able to learn new things and how she need to take the time and care for herself.   Bing Plume D 10/28/2012, 8:37 PM

## 2012-10-29 ENCOUNTER — Ambulatory Visit (HOSPITAL_COMMUNITY): Payer: Self-pay | Admitting: Psychiatry

## 2012-10-29 NOTE — Progress Notes (Signed)
Recreation Therapy Notes  Date: 06.03.2014 Time: 2:45pm Location: [address] Dayroom      Group Topic/Focus: Musician (AAA/T)  Participation Level: Active  Participation Quality: Appropriate  Affect: Euthymic  Cognitive: Appropriate  Additional Comments: 06.03.2014 Session =  AAA  ; Dog Team = [facility] & handler  Patient pet and visited with [place]. Patient shared stories with peers about her dog. Patient interacted appropriately with peer, LRT and dog team.   Jearl Klinefelter, LRT/CTRS  Ghislaine Harcum L 10/29/2012 4:30 PM

## 2012-10-29 NOTE — [facility] Group Notes (Signed)
[facility] LCSW Aftercare Discharge Planning Group Note   10/29/2012 8:45 AM  Participation Quality:  Alert and Appropriate   Mood/Affect:  Appropriate  Depression Rating:  7   Anxiety Rating:  7  Thoughts of Suicide:  Pt denies SI/HI  Will you contract for safety?   Yes  Current AVH:  No  Plan for Discharge/Comments:  Pt attended discharge planning group and actively participated in group.  CSW provided pt with today's workbook.  Pt states that she had poor sleep last night due to going to bed too early.  Pt will return home in [place].  Pt was seeing Dr. Dan Humphreys in [facility] in [place] for medication management, but due to him leaving the practice pt would like to switch back to Dr. Lolly Mustache here in [facility] outpatient clinic.  CSW scheduled pt's follow up appointment with Dr. Lolly Mustache.  Pt states that she doesn't see a therapist due to finances.  No further needs voiced by pt at this time.    Transportation Means: Pt states that her sister will pick her up upon d/c  Supports: Pt states that her sister is supportive despite living situation being stressful  Reyes Ivan, LCSWA 10/29/2012 10:03 AM

## 2012-10-29 NOTE — [facility] Group Notes (Signed)
[facility] LCSW Group Therapy  10/29/2012  1:15 PM   Type of Therapy:  Group Therapy  Participation Level:  Active  Participation Quality:  Appropriate and Attentive  Affect:  Appropriate  Cognitive:  Alert and Appropriate  Insight:  Developing/Improving and Engaged  Engagement in Therapy:  Developing/Improving and Engaged  Modes of Intervention:  Clarification, Confrontation, Discussion, Education, Exploration, Limit-setting, Orientation, Problem-solving, Rapport Building, Dance movement psychotherapist, Socialization and Support  Summary of Progress/Problems:  The topic for group therapy was feelings about diagnosis.  Pt actively participated in group discussion on their past and current diagnosis and how they feel towards this.  Pt also identified how society and family members judge them, based on their diagnosis as well as stereotypes and stigmas.   Pt shared that she often feels why does she have this diagnosis and wants answers.  Pt was able to process symptoms of anger, rage and depression and voice how this has impacted her and her family.  Pt states that she feels guilty for her anger towards her family when they are the one caring for her.  Pt actively participated in group discussion.    Allison Sharp, [place] 10/29/2012 2:48 PM

## 2012-10-29 NOTE — Progress Notes (Signed)
Adult Psychoeducational Group Note  Date:  10/29/2012 Time:  1:22 PM  Group Topic/Focus:  Recovery Goals:   The focus of this group is to identify appropriate goals for recovery and establish a plan to achieve them.  Participation Level:  Active  Participation Quality:  Appropriate and Attentive  Affect:  Appropriate  Cognitive:  Alert and Appropriate  Insight: Appropriate  Engagement in Group:  Engaged  Modes of Intervention:  Discussion and Education  Additional Comments:  Pt attended group and participated in group discussion.  Shelly Bombard D 10/29/2012, 1:22 PM

## 2012-10-29 NOTE — [facility] Suicide Risk Assessment (Signed)
[facility] INPATIENT:  Family/Significant Other Suicide Prevention Education  Suicide Prevention Education:  Education Completed; Allison Sharp - sister [phone]),  (name of family member/significant other) has been identified by the patient as the family member/significant other with whom the patient will be residing, and identified as the person(s) who will aid the patient in the event of a mental health crisis (suicidal ideations/suicide attempt).  With written consent from the patient, the family member/significant other has been provided the following suicide prevention education, prior to the and/or following the discharge of the patient.  The suicide prevention education provided includes the following:  Suicide risk factors  Suicide prevention and interventions  National Suicide Hotline telephone number  [facility] assessment telephone number  [facility] Emergency Assistance 911  [facility] and/or Residential Mobile Crisis Unit telephone number  Request made of family/significant other to:  Remove weapons (e.g., guns, rifles, knives), all items previously/currently identified as safety concern.    Remove drugs/medications (over-the-counter, prescriptions, illicit drugs), all items previously/currently identified as a safety concern.  The family member/significant other verbalizes understanding of the suicide prevention education information provided.  The family member/significant other agrees to remove the items of safety concern listed above.  Allison Sharp 10/29/2012, 10:14 AM

## 2012-10-29 NOTE — Progress Notes (Signed)
Grief & Loss Group   The group discussed significant losses through the use of a creative photograph activity. The activity involved the selection of a photograph that connected with members' grief and loss experiences and emotions.  Pt was present and attentive in group. Pt contributed an idea for a group rule. Pt left the group midway when another group member was talking about the importance of letting women go first in the cafeteria. The pt seemed frustrated when she heard the comment, returned the picture to the table, and left the room.   Sherol Dade Counselor Intern Haroldine Laws

## 2012-10-29 NOTE — Progress Notes (Signed)
D:  Patient's self inventory sheet, patient has poor sleep, good appetite, low energy level, improving attentin span.  Rated depression and hopelessness #7.  Denied withdrawals.  Denied SI.   Has less physical problems today than yesterday.  After discharge, plans to "tell how I feel more often, ask for help." A:  Medications administered per MD orders.  Emotional support and encouragement given patient. R:  Denied SI and HI.   Denied A/V hallucinations.   Will continue to monitor patient every 15 minutes with safety checks.  Safety maintained.

## 2012-10-29 NOTE — Progress Notes (Signed)
Patient ID: Allison Sharp, female   DOB: May 03, 1956, 57 y.o.   MRN: [id number] [facility] MD Progress Note  10/29/2012 4:08 PM Allison Sharp  MRN:  [id number]  Subjective: Allison Sharp reports feeling better since a certain patient on the unit was discharged today stating "He really triggered me just like my family at home." However, patient reports she was better able to deal with the stressor stating "Usually I would start yelling. But I would just come to my room to collect myself." Patient has talked to her family about how she feels and reports they will try to do more to help her. Reports depression is at six versus a ten on admission. The patient feels that her symptoms are improving and she is satisfied with her current regimen. Patient stated "I'm getting to the point where I do not get upset and angered so easily."   Diagnosis:   Axis I: Anxiety Disorder NOS and Bipolar, Depressed Axis II: Deferred Axis III:  Past Medical History  Diagnosis Date  . Hyperlipidemia   . CAD (coronary artery disease)   . Bipolar 1 disorder   . Schizophrenia   . GERD (gastroesophageal reflux disease)   . Hyperthyroidism   . Chronic back pain   . Dyspnea     PFT 03/05/09 FEV1 2.77(98%), FVC 3.25(86%), FEV1% 85, TLC 5.88(99%), DLCO 60% ,  Methacholine challenge 03/16/09 normal ,  CT chest 03/12/09 no pulmonary disease  . Anxiety   . Arthritis   . Depression   . OSA on CPAP     2 liters  . HTN (hypertension)   . History of colonoscopy 10/17/2002    by Dr Rehman-> distal non-specific proctitis, small ext hemorrhoids,   . Fungal infection   . Cellulitis   . Contusion of sacrum   . Migraine headache   . Vitamin D deficiency   . Sleep apnea   . Myocardial infarction     NOV 1997  . Hypothyroidism     States she only has hyperthyroidism  . Complication of anesthesia     States she typically gets sick s/p anesthesia  . Morbid obesity with body mass index of 50.0-59.9 in adult JAN 2011 370 LBS    2004 311 BMI 45.9   . Obsessive-compulsive disorder   . PTSD (post-traumatic stress disorder)   . Asthma   . Allergy   . Urine incontinence   . Chronic headaches   . Suicidal ideation   . COPD (chronic obstructive pulmonary disease)    Axis IV: other psychosocial or environmental problems, problems related to social environment and problems with primary support group Axis V: 41-50 serious symptoms  ADL's:  Intact  Sleep: Good  Appetite:  Good  Suicidal Ideation:  Denies Homicidal Ideation:  Plan:  None Intent:  None Means:  None AEB (as evidenced by): Patient's report of decreased symptoms, improved mood, sleep and appetite.  Psychiatric Specialty Exam: Review of Systems  Constitutional: Negative.   HENT: Negative.   Eyes: Negative.   Respiratory: Negative.   Cardiovascular: Negative.   Gastrointestinal: Negative.   Genitourinary: Negative.   Musculoskeletal: Positive for back pain.  Skin: Negative.   Neurological: Negative.   Endo/Heme/Allergies: Negative.   Psychiatric/Behavioral: Positive for depression. Negative for suicidal ideas, hallucinations, memory loss and substance abuse. The patient is nervous/anxious. The patient does not have insomnia.     Blood pressure 119/81, pulse 83, temperature 97.9 F (36.6 C), temperature source Oral, resp. rate 18, weight 167.831 kg (370 lb),  last menstrual period 02/18/1995.Body mass index is 55.82 kg/(m^2).  General Appearance: Casual  Eye Contact::  Good  Speech:  Normal Rate  Volume:  Normal  Mood:  Anxious and Depressed  Affect:  Congruent  Thought Process:  Coherent  Orientation:  Full (Time, Place, and Person)  Thought Content:  WDL  Suicidal Thoughts:  No  Homicidal Thoughts:  Denies  Memory:  Immediate;   Fair Recent;   Fair Remote;   Fair  Judgement:  Fair  Insight:  Fair  Psychomotor Activity:  Increased  Concentration:  Fair  Recall:  Fair  Akathisia:  No  Handed:  Right  AIMS (if indicated):     Assets:   Communication Skills Resilience Social Support  Sleep:  Number of Hours: 5   Current Medications: Current Facility-Administered Medications  Medication Dose Route Frequency Provider Last Rate Last Dose  . acetaminophen (TYLENOL) tablet 650 mg  650 mg Oral Q6H PRN Shuvon Rankin, NP   650 mg at 10/28/12 0941  . albuterol (PROVENTIL HFA;VENTOLIN HFA) 108 (90 BASE) MCG/ACT inhaler 2 puff  2 puff Inhalation Q6H PRN Shuvon Rankin, NP   2 puff at 10/29/12 0757  . alum & mag hydroxide-simeth (MAALOX/MYLANTA) 200-200-20 MG/5ML suspension 30 mL  30 mL Oral Q4H PRN Shuvon Rankin, NP      . amitriptyline (ELAVIL) tablet 50 mg  50 mg Oral QHS Shuvon Rankin, NP   50 mg at 10/28/12 2128  . aspirin chewable tablet 81 mg  81 mg Oral Daily Shuvon Rankin, NP   81 mg at 10/29/12 0750  . atorvastatin (LIPITOR) tablet 20 mg  20 mg Oral Daily Shuvon Rankin, NP   20 mg at 10/29/12 0750  . buPROPion [facility] SR) 12 hr tablet 200 mg  200 mg Oral Q breakfast Himabindu Ravi, MD   200 mg at 10/29/12 0751  . divalproex (DEPAKOTE ER) 24 hr tablet 1,500 mg  1,500 mg Oral QHS Shuvon Rankin, NP   1,500 mg at 10/28/12 2127  . donepezil (ARICEPT) tablet 10 mg  10 mg Oral Q breakfast Shuvon Rankin, NP   10 mg at 10/29/12 0752  . DULoxetine (CYMBALTA) DR capsule 60 mg  60 mg Oral BID Shuvon Rankin, NP   60 mg at 10/29/12 0752  . fluticasone (FLONASE) 50 MCG/ACT nasal spray 1 spray  1 spray Each Nare BID Shuvon Rankin, NP   1 spray at 10/28/12 1656  . furosemide (LASIX) tablet 30 mg  30 mg Oral Daily Nanine Means, NP   30 mg at 10/29/12 0753  . gabapentin (NEURONTIN) capsule 200 mg  200 mg Oral TID Nehemiah Settle, MD   200 mg at 10/29/12 1148  . levothyroxine (SYNTHROID, LEVOTHROID) tablet 125 mcg  125 mcg Oral QAC breakfast Shuvon Rankin, NP   125 mcg at 10/29/12 0557  . loratadine (CLARITIN) tablet 10 mg  10 mg Oral Daily Verne Spurr, PA-C   10 mg at 10/29/12 0754  . magnesium hydroxide (MILK OF MAGNESIA)  suspension 30 mL  30 mL Oral Daily PRN Shuvon Rankin, NP      . multivitamin with minerals tablet 1 tablet  1 tablet Oral Daily Shuvon Rankin, NP   1 tablet at 10/29/12 0754  . niacin (SLO-NIACIN) CR tablet 500 mg  500 mg Oral QHS Himabindu Ravi, MD   500 mg at 10/28/12 2127  . pantoprazole (PROTONIX) EC tablet 40 mg  40 mg Oral Daily Shuvon Rankin, NP   40 mg at 10/29/12 0755  .  polyethylene glycol (MIRALAX / GLYCOLAX) packet 17 g  17 g Oral Daily Shuvon Rankin, NP   17 g at 10/29/12 0829  . potassium chloride (K-DUR,KLOR-CON) CR tablet 10 mEq  10 mEq Oral Daily Nanine Means, NP   10 mEq at 10/29/12 0755  . traZODone (DESYREL) tablet 100 mg  100 mg Oral QHS PRN Nanine Means, NP   100 mg at 10/28/12 2128  . verapamil (CALAN-SR) CR tablet 180 mg  180 mg Oral Daily Nanine Means, NP   180 mg at 10/29/12 4540    Lab Results: No results found for this or any previous visit (from the past 48 hour(s)).  Physical Findings: AIMS: Facial and Oral Movements Muscles of Facial Expression: None, normal Lips and Perioral Area: None, normal Jaw: None, normal Tongue: None, normal,Extremity Movements Upper (arms, wrists, hands, fingers): None, normal Lower (legs, knees, ankles, toes): None, normal, Trunk Movements Neck, shoulders, hips: None, normal, Overall Severity Severity of abnormal movements (highest score from questions above): None, normal Incapacitation due to abnormal movements: None, normal Patient's awareness of abnormal movements (rate only patient's report): No Awareness, Dental Status Current problems with teeth and/or dentures?: No Does patient usually wear dentures?: No  CIWA:  CIWA-Ar Total: 2 COWS:  COWS Total Score: 2  Treatment Plan Summary: Daily contact with patient to assess and evaluate symptoms and progress in treatment Medication management  Plan:  Review of chart, vital signs, medications, and notes. Continue crisis management and stabilization.  Medication management:  Medications reviewed with patient who reports no untoward effects.  Encouraged patient to attend groups and participate in group counseling sessions and activities.  Discharge plan in progress.  Address health issues: Vitals reviewed and stable.  Continue current treatment plan.  Medical Decision Making Problem Points:  Established problem, stable/improving (1) and Review of psycho-social stressors (1) Data Points:  Review of new medications or change in dosage (2)  Fransisca Kaufmann NP-C 10/29/12 16:17 I certify that inpatient services furnished can reasonably be expected to improve the patient's condition.

## 2012-10-29 NOTE — Progress Notes (Signed)
Pt met with other pt's in 500 day room following recreation therapy group. Pt's spoke with chaplain about frustrations with conflict generated by a fellow pt. Spoke about distraction from focusing on themselves and anxiety level. Pt's spoke about what they were working on at [facility] and ways they have been able to remain focused.  Another pt wished to pray with the group and [place] to participate. She stated she would like to pray for inner peace and spoke with the rest of the group about this being a common need.  Stated that she was unable to attend church Sunday and that her faith figures prominently in her self understanding and healing. Feels supported by her bible study small group, who know her struggles.

## 2012-10-29 NOTE — Progress Notes (Signed)
Adult Psychoeducational Group Note  Date:  10/29/2012 Time:  8:58 PM  Group Topic/Focus:  Wrap-Up Group:   The focus of this group is to help patients review their daily goal of treatment and discuss progress on daily workbooks.  Participation Level:  Active  Participation Quality:  Appropriate  Affect:  Blunted and Flat  Cognitive:  Appropriate  Insight: Improving  Engagement in Group:  Engaged  Modes of Intervention:  Discussion and Exploration  Additional Comments:  Pt stated that her goal was to work on her anger issues which she wasn't able to do until today due to another patient. However, that patient has since discharged and the patient has felt better since. Stated that she learned to "walk away" when she got angry with him.   Humberto Seals Monique 10/29/2012, 8:58 PM

## 2012-10-30 MED ORDER — ALBUTEROL SULFATE (5 MG/ML) 0.5% IN NEBU: 2.5000 mg | INHALATION_SOLUTION | Freq: Four times a day (QID) | RESPIRATORY_TRACT | Status: DC | PRN

## 2012-10-30 MED ORDER — ALBUTEROL SULFATE (5 MG/ML) 0.5% IN NEBU
2.5000 mg | INHALATION_SOLUTION | Freq: Once | RESPIRATORY_TRACT | Status: AC
  Administered 2012-10-30: 2.5 mg via RESPIRATORY_TRACT
  Filled 2012-10-30: qty 0.5

## 2012-10-30 NOTE — Progress Notes (Signed)
D:  Ziya reports that she slept poorly and her appetite is good.  She rates her depression and hopelessness at 6/10 and states that she is much better than when she came in.  She denies SI/HI/AVH at this time.  She is attending groups and is interacting appropriately with staff and other patients.   A:  Medications administered as ordered.  Safety checks q 15 minutes.  Emotional support provided. R:  Safety maintained on unit.

## 2012-10-30 NOTE — Clinical Social Work Note (Signed)
CSW spoke with pt's sister, Ms Jarold Motto again on this date.  CSW discussed pt's discharge plans with sister and how pt can best be supported at home.  Ms. Jarold Motto states that her daughter, pt's niece came to live with them a few months ago and she is what stresses pt out.  Ms. Jarold Motto states that she knows how pt is and how to support her but her niece doesn't and doesn't want to hear it from her.  Ms. Jarold Motto states that it would be helpful for niece to hear these concerns by CSW.  CSW will relay this to pt tomorrow and possibly meet with niece or talk to her on the phone to support pt in d/c.    Reyes Ivan, [place] 10/30/2012  3:51 PM

## 2012-10-30 NOTE — Progress Notes (Signed)
Recreation Therapy Notes  Date: 06.04.2014  Time: 3:00pm  Location: 500 Hall Dayroom   Group Topic/Focus: Geophysicist/field seismologist   Participation Level:  Active   Participation Quality:  Appropriate and Attentive   Affect:  Euthymic   Cognitive:  Appropriate   Additional Comments: Activity: Adapted Toilet Paper Game; Explanation: Patients were instructed to take as many squares of toilet paper as they will need for the remainder of the afternoon. Patients were then instructed they were to identify that many number of ways they personally develop or grow. Due to concerned looks on patient faces, as well as lack of motivation of patients to participate in activity LRT adapted activity for group to come up with a list of 18 ways they can personally develop.   Patient participated in group activity. Patient offered suggestions and activities to group list. Patient with peers successfully identified 18 ways they can personally develop post d/c. Patient contributed to wrap up discussion about the importance of personal development.   Allison Sharp Allison Sharp, LRT/CTRS  Shraddha Lebron L 10/30/2012 4:40 PM

## 2012-10-30 NOTE — BHH Group Notes (Signed)
[facility] LCSW Aftercare Discharge Planning Group Note   10/30/2012 8:45 AM  Participation Quality:  Alert and Appropriate   Mood/Affect:  Appropriate  Depression Rating:  6-7   Anxiety Rating:  5  Thoughts of Suicide:  Pt denies SI/HI  Will you contract for safety?   Yes  Current AVH:  No  Plan for Discharge/Comments:  Pt attended discharge planning group and actively participated in group.  CSW provided pt with today's workbook.  Pt states that she feels much better today and contributes it to going to groups and learning to cope with her symptoms.  Pt will return home in [place].  Pt was seeing Dr. Dan Humphreys in [facility] in [place] for medication management, but due to him leaving the practice pt would like to switch back to Dr. Lolly Mustache here in [facility] outpatient clinic.  CSW scheduled pt's follow up appointment with Dr. Lolly Mustache in July as well as confirmed pt's last appointment with Dr. Dan Humphreys in June.  Pt now is open to following up with a therapist in [place]'s outpatient clinic. CSW will schedule this appointment.  No further needs voiced by pt at this time.    Transportation Means: Pt states that her sister will pick her up upon d/c  Supports: Family is supportive  Reyes Ivan, LCSWA 10/30/2012 10:05 AM

## 2012-10-30 NOTE — Progress Notes (Signed)
Patient ID: Allison Sharp, female   DOB: 1955-09-27, 57 y.o.   MRN: [id number] Patient ID: Allison Sharp, female   DOB: 08-May-1956, 57 y.o.   MRN: [id number] [facility] MD Progress Note  10/30/2012 2:58 PM Allison Sharp  MRN:  [id number]  Subjective: Allison Sharp reports feeling an improvement in her symptoms. She is hopeful about going home tomorrow. Rates her depression at six and anxiety at five. Patient reports increase in symptoms is related to worrying about what will happen after her discharge. She reports talking to sister about her expectations related to coming home and having "a stable house." She also requested that the case manager call her sister to talk about her concerns. Patient intends to save her daily handouts from her stay and show them to her family.   Objective: Patient observed coughing during assessment. Lungs with bilateral wheezes throughout.  Diagnosis:   Axis I: Anxiety Disorder NOS and Bipolar, Depressed Axis II: Deferred Axis III:  Past Medical History  Diagnosis Date  . Hyperlipidemia   . CAD (coronary artery disease)   . Bipolar 1 disorder   . Schizophrenia   . GERD (gastroesophageal reflux disease)   . Hyperthyroidism   . Chronic back pain   . Dyspnea     PFT 03/05/09 FEV1 2.77(98%), FVC 3.25(86%), FEV1% 85, TLC 5.88(99%), DLCO 60% ,  Methacholine challenge 03/16/09 normal ,  CT chest 03/12/09 no pulmonary disease  . Anxiety   . Arthritis   . Depression   . OSA on CPAP     2 liters  . HTN (hypertension)   . History of colonoscopy 10/17/2002    by Dr Rehman-> distal non-specific proctitis, small ext hemorrhoids,   . Fungal infection   . Cellulitis   . Contusion of sacrum   . Migraine headache   . Vitamin D deficiency   . Sleep apnea   . Myocardial infarction     NOV 1997  . Hypothyroidism     States she only has hyperthyroidism  . Complication of anesthesia     States she typically gets sick s/p anesthesia  . Morbid obesity with body mass index of 50.0-59.9 in  adult JAN 2011 370 LBS    2004 311 BMI 45.9  . Obsessive-compulsive disorder   . PTSD (post-traumatic stress disorder)   . Asthma   . Allergy   . Urine incontinence   . Chronic headaches   . Suicidal ideation   . COPD (chronic obstructive pulmonary disease)    Axis IV: other psychosocial or environmental problems, problems related to social environment and problems with primary support group Axis V: 41-50 serious symptoms  ADL's:  Intact  Sleep: Fair  Appetite:  Good  Suicidal Ideation:  Denies Homicidal Ideation:  Plan:  None Intent:  None Means:  None AEB (as evidenced by): Patient's report of decreased symptoms, improved mood, sleep and appetite.  Psychiatric Specialty Exam: Review of Systems  Constitutional: Negative.   HENT: Negative.   Eyes: Negative.   Respiratory: Positive for cough, shortness of breath and wheezing.   Cardiovascular: Negative.   Gastrointestinal: Negative.   Genitourinary: Negative.   Musculoskeletal: Positive for back pain.  Skin: Negative.   Neurological: Negative.   Endo/Heme/Allergies: Negative.   Psychiatric/Behavioral: Positive for depression. Negative for suicidal ideas, hallucinations, memory loss and substance abuse. The patient is nervous/anxious. The patient does not have insomnia.     Blood pressure 113/68, pulse 116, temperature 97.9 F (36.6 C), temperature source Oral, resp. rate 16,  weight 167.831 kg (370 lb), last menstrual period 02/18/1995.Body mass index is 55.82 kg/(m^2).  General Appearance: Casual  Eye Contact::  Good  Speech:  Normal Rate  Volume:  Normal  Mood:  Anxious and Depressed  Affect:  Congruent  Thought Process:  Coherent  Orientation:  Full (Time, Place, and Person)  Thought Content:  WDL  Suicidal Thoughts:  No  Homicidal Thoughts:  Denies  Memory:  Immediate;   Fair Recent;   Fair Remote;   Fair  Judgement:  Fair  Insight:  Fair  Psychomotor Activity:  Increased  Concentration:  Fair  Recall:   Fair  Akathisia:  No  Handed:  Right  AIMS (if indicated):     Assets:  Communication Skills Resilience Social Support  Sleep:  Number of Hours: 5.5   Current Medications: Current Facility-Administered Medications  Medication Dose Route Frequency Provider Last Rate Last Dose  . acetaminophen (TYLENOL) tablet 650 mg  650 mg Oral Q6H PRN Shuvon Rankin, NP   650 mg at 10/28/12 0941  . albuterol (PROVENTIL HFA;VENTOLIN HFA) 108 (90 BASE) MCG/ACT inhaler 2 puff  2 puff Inhalation Q6H PRN Shuvon Rankin, NP   2 puff at 10/30/12 1409  . albuterol (PROVENTIL) (5 MG/ML) 0.5% nebulizer solution 2.5 mg  2.5 mg Nebulization Q6H PRN Fransisca Kaufmann, NP      . albuterol (PROVENTIL) (5 MG/ML) 0.5% nebulizer solution 2.5 mg  2.5 mg Nebulization Once Fransisca Kaufmann, NP      . alum & mag hydroxide-simeth (MAALOX/MYLANTA) 200-200-20 MG/5ML suspension 30 mL  30 mL Oral Q4H PRN Shuvon Rankin, NP      . amitriptyline (ELAVIL) tablet 50 mg  50 mg Oral QHS Shuvon Rankin, NP   50 mg at 10/29/12 2120  . aspirin chewable tablet 81 mg  81 mg Oral Daily Shuvon Rankin, NP   81 mg at 10/30/12 0757  . atorvastatin (LIPITOR) tablet 20 mg  20 mg Oral Daily Shuvon Rankin, NP   20 mg at 10/30/12 0758  . buPROPion [facility] SR) 12 hr tablet 200 mg  200 mg Oral Q breakfast Himabindu Ravi, MD   200 mg at 10/30/12 0757  . divalproex (DEPAKOTE ER) 24 hr tablet 1,500 mg  1,500 mg Oral QHS Shuvon Rankin, NP   1,500 mg at 10/29/12 2119  . donepezil (ARICEPT) tablet 10 mg  10 mg Oral Q breakfast Shuvon Rankin, NP   10 mg at 10/30/12 0756  . DULoxetine (CYMBALTA) DR capsule 60 mg  60 mg Oral BID Shuvon Rankin, NP   60 mg at 10/30/12 0756  . fluticasone (FLONASE) 50 MCG/ACT nasal spray 1 spray  1 spray Each Nare BID Shuvon Rankin, NP   1 spray at 10/28/12 1656  . furosemide (LASIX) tablet 30 mg  30 mg Oral Daily Nanine Means, NP   30 mg at 10/30/12 0758  . gabapentin (NEURONTIN) capsule 200 mg  200 mg Oral TID Nehemiah Settle, MD    200 mg at 10/30/12 1201  . levothyroxine (SYNTHROID, LEVOTHROID) tablet 125 mcg  125 mcg Oral QAC breakfast Shuvon Rankin, NP   125 mcg at 10/30/12 0619  . loratadine (CLARITIN) tablet 10 mg  10 mg Oral Daily Verne Spurr, PA-C   10 mg at 10/30/12 0757  . magnesium hydroxide (MILK OF MAGNESIA) suspension 30 mL  30 mL Oral Daily PRN Shuvon Rankin, NP      . multivitamin with minerals tablet 1 tablet  1 tablet Oral Daily Shuvon Rankin, NP  1 tablet at 10/30/12 0758  . niacin (SLO-NIACIN) CR tablet 500 mg  500 mg Oral QHS Himabindu Ravi, MD   500 mg at 10/29/12 2120  . pantoprazole (PROTONIX) EC tablet 40 mg  40 mg Oral Daily Shuvon Rankin, NP   40 mg at 10/30/12 0757  . polyethylene glycol (MIRALAX / GLYCOLAX) packet 17 g  17 g Oral Daily Shuvon Rankin, NP   17 g at 10/30/12 0755  . potassium chloride (K-DUR,KLOR-CON) CR tablet 10 mEq  10 mEq Oral Daily Nanine Means, NP   10 mEq at 10/30/12 0757  . traZODone (DESYREL) tablet 100 mg  100 mg Oral QHS PRN Nanine Means, NP   100 mg at 10/29/12 2120  . verapamil (CALAN-SR) CR tablet 180 mg  180 mg Oral Daily Nanine Means, NP   180 mg at 10/30/12 0757    Lab Results: No results found for this or any previous visit (from the past 48 hour(s)).  Physical Findings: AIMS: Facial and Oral Movements Muscles of Facial Expression: None, normal Lips and Perioral Area: None, normal Jaw: None, normal Tongue: None, normal,Extremity Movements Upper (arms, wrists, hands, fingers): None, normal Lower (legs, knees, ankles, toes): None, normal, Trunk Movements Neck, shoulders, hips: None, normal, Overall Severity Severity of abnormal movements (highest score from questions above): None, normal Incapacitation due to abnormal movements: None, normal Patient's awareness of abnormal movements (rate only patient's report): No Awareness, Dental Status Current problems with teeth and/or dentures?: No Does patient usually wear dentures?: No  CIWA:  CIWA-Ar Total:  2 COWS:  COWS Total Score: 2  Treatment Plan Summary: Daily contact with patient to assess and evaluate symptoms and progress in treatment Medication management  Plan:  Review of chart, vital signs, medications, and notes. Continue crisis management and stabilization.  Medication management: Medications reviewed with patient who reports no untoward effects.  Encouraged patient to attend groups and participate in group counseling sessions and activities.  Discharge plan in progress.  Address health issues: Vitals reviewed and stable. Patient reports increased wheezing and SOB related to her asthma. Ordered now dose of albuterol nebulizer treatment and also ordered every six hours as needed.  Continue current treatment plan.  Medical Decision Making Problem Points:  Established problem, stable/improving (1) and Review of psycho-social stressors (1) Data Points:  Review of new medications or change in dosage (2)  Fransisca Kaufmann NP-C 10/30/12 16:17 I certify that inpatient services furnished can reasonably be expected to improve the patient's condition.

## 2012-10-30 NOTE — Tx Team (Signed)
Interdisciplinary Treatment Plan Update (Adult)  Date: 10/30/2012  Time Reviewed:  9:45 AM  Progress in Treatment: Attending groups: Yes Participating in groups:  Yes Taking medication as prescribed:  Yes Tolerating medication:  Yes Family/Significant othe contact made: Contact made with sister Patient understands diagnosis:  Yes Discussing patient identified problems/goals with staff:  Yes Medical problems stabilized or resolved:  Yes Denies suicidal/homicidal ideation: Yes Issues/concerns per patient self-inventory:  Yes Other:  New problem(s) identified: N/A  Discharge Plan or Barriers: Pt has follow up scheduled with [facility] for medication management and therapy.    Reason for Continuation of Hospitalization: Anxiety Depression Medication Stabilization  Comments: N/A  Estimated length of stay: 1 day, d/c tomorrow  For review of initial/current patient goals, please see plan of care.  Attendees: Patient:     Family:     Physician:  Dr. Javier Glazier 10/30/2012 9:37 AM   Nursing:   Carney Living, RN 10/30/2012 9:37 AM   Clinical Social Worker:  Reyes Ivan, LCSWA 10/30/2012 9:37 AM   Other: Fransisca Kaufmann, NP 10/30/2012 9:37 AM   Other:  Frankey Shown, MA care coordination 10/30/2012 9:37 AM   Other:  Juline Patch, LCSW 10/30/2012 9:37 AM   Other:  Chinita Greenland, RN 10/30/2012 9:37 AM  Other: Tomasita Morrow, care coordination  10/30/2012 9:37 AM  Other:    Other:    Other:    Other:    Other:     Scribe for Treatment Team:   Carmina Miller, 10/30/2012 9:37 AM

## 2012-10-30 NOTE — Progress Notes (Signed)
Chaplain follow up.  Pt expressed feeling better / less anxious.  Reports she has not been sleeping well.   Spoke with chaplain about her home environment (living with sister, niece and great nephew).  Described sister and niece fighting often and pt is hopeful that she will be able to communicate to family that this is not helpful for her.   Living with sister due to early onset dementia.   Motivated by great nephew (7), whom she talks with often.    Spoke with chaplain about her bible study group, which she finds very helpful.  She has known these 6 people over a 5 year period and feels they are people with whom she is able to be honest.    Requested prayers with chaplain.   Will continue to follow for support during admission.   Belva Crome MDiv

## 2012-10-30 NOTE — [facility] Group Notes (Signed)
[facility] LCSW Group Therapy  10/30/2012  1:15 PM   Type of Therapy:  Group Therapy  Participation Level:  Active  Participation Quality:  Appropriate and Attentive  Affect:  Appropriate  Cognitive:  Alert and Appropriate  Insight:  Developing/Improving and Engaged  Engagement in Therapy:  Developing/Improving and Engaged  Modes of Intervention:  Clarification, Confrontation, Discussion, Education, Exploration, Limit-setting, Orientation, Problem-solving, Rapport Building, Dance movement psychotherapist, Socialization and Support  Summary of Progress/Problems: The topic for group today was emotional regulation.  Pt participated in the discussion regarding what emotional regulation is and how it affects their life, positive and negative.  Pt discussed coping skills and ways they can regulate their emotions in a positive manner.   Pt shared that she regulates her emotions negatively by bottling up her emotions which results in rage and anger.  Pt shared that she is nervous about going home, as she believes not much will change.  Pt discussed ways she can cope in the home when she is feeling overwhelmed, such as going to a friend's house.    Reyes Ivan, LCSWA 10/30/2012 2:06 PM

## 2012-10-30 NOTE — Progress Notes (Signed)
Patient ID: Allison GOGUEN, female   DOB: September 04, 1955, 57 y.o.   MRN: [id number] D: Pt. Visible on unit in dayroom watching TV and interacting with other clients. Pt. Reports depression at "3" of 10. "doing better I talked to the SW today and I talked to my family twice today" "I think they finally understand what I need" "peace and not stressors" Pt. Reports she feels ready to go home. A: Writer reviewed meds with client, and encouraged group. Staff will monitor q62min for safety. R: Pt. Attended and participated in group. Pt. Is safe on the unit.

## 2012-10-30 NOTE — Progress Notes (Signed)
Pt.  States that her depression has decreased from a 10 to a 7.  She states that it is getting better since a pt. That was on the same hall was discharged, says he kept her upset and things are much calmer on the hall now.  Pt. Had requested something for anxiety but no longer needs it now.  Pt. States she has learned to ask for help at [facility] and not to isolate but to allow others to help her.  No complaints of pain or discomfort this pm.  Negative SI and HI. At this time.

## 2012-10-30 NOTE — Progress Notes (Signed)
Adult Psychoeducational Group Note  Date:  10/30/2012 Time:  8:00PM Group Topic/Focus:  Wrap-Up Group:   The focus of this group is to help patients review their daily goal of treatment and discuss progress on daily workbooks.  Participation Level:  Active  Participation Quality:  Appropriate and Attentive  Affect:  Appropriate  Cognitive:  Alert and Appropriate  Insight: Appropriate  Engagement in Group:  Engaged  Modes of Intervention:  Discussion  Additional Comments:  Pt. Was attentive and appropriate during tonight's group discussion. Pt. Shared that she is nervous about being discharged tomorrow. Pt. Stated that social worker was able to talk to her family members about how they can make life at home easier. Pt shared that she is continuing to learn how to deal with stress better and will use coping skills learned once discharged.   Bing Plume D 10/30/2012, 9:10 PM

## 2012-10-31 ENCOUNTER — Telehealth (HOSPITAL_COMMUNITY): Payer: Self-pay | Admitting: Psychiatry

## 2012-10-31 DIAGNOSIS — F259 Schizoaffective disorder, unspecified: Secondary | ICD-10-CM

## 2012-10-31 MED ORDER — CETIRIZINE HCL 10 MG PO TABS: 10.0000 mg | ORAL_TABLET | Freq: Every day | ORAL | Status: DC

## 2012-10-31 MED ORDER — DIVALPROEX SODIUM ER 500 MG PO TB24: 1500.0000 mg | ORAL_TABLET | Freq: Every day | ORAL | Status: DC

## 2012-10-31 MED ORDER — GABAPENTIN 100 MG PO CAPS: 200.0000 mg | ORAL_CAPSULE | Freq: Three times a day (TID) | ORAL | Status: DC

## 2012-10-31 MED ORDER — ALBUTEROL SULFATE HFA 108 (90 BASE) MCG/ACT IN AERS: 2.0000 | INHALATION_SPRAY | Freq: Four times a day (QID) | RESPIRATORY_TRACT | Status: DC | PRN

## 2012-10-31 MED ORDER — TRAZODONE HCL 100 MG PO TABS: 100.0000 mg | ORAL_TABLET | Freq: Every evening | ORAL | Status: DC | PRN

## 2012-10-31 MED ORDER — DULOXETINE HCL 60 MG PO CPEP: 60.0000 mg | ORAL_CAPSULE | Freq: Two times a day (BID) | ORAL | Status: DC

## 2012-10-31 MED ORDER — FLUTICASONE PROPIONATE 50 MCG/ACT NA SUSP: 1.0000 | Freq: Two times a day (BID) | NASAL | Status: DC

## 2012-10-31 MED ORDER — POLYETHYLENE GLYCOL 3350 17 G PO PACK: 17.0000 g | PACK | Freq: Every day | ORAL | Status: DC

## 2012-10-31 MED ORDER — ASPIRIN 81 MG PO CHEW: 81.0000 mg | CHEWABLE_TABLET | Freq: Every day | ORAL | Status: DC

## 2012-10-31 MED ORDER — NYSTATIN 100000 UNIT/GM EX POWD: 1.0000 g | Freq: Three times a day (TID) | CUTANEOUS | Status: DC | PRN

## 2012-10-31 MED ORDER — SENNA-DOCUSATE SODIUM 8.6-50 MG PO TABS: 1.0000 | ORAL_TABLET | Freq: Every day | ORAL | Status: DC

## 2012-10-31 MED ORDER — AMITRIPTYLINE HCL 50 MG PO TABS: 50.0000 mg | ORAL_TABLET | Freq: Every day | ORAL | Status: DC

## 2012-10-31 MED ORDER — FUROSEMIDE 20 MG PO TABS: 30.0000 mg | ORAL_TABLET | Freq: Every day | ORAL | Status: DC

## 2012-10-31 MED ORDER — OMEPRAZOLE 20 MG PO CPDR: 20.0000 mg | DELAYED_RELEASE_CAPSULE | Freq: Every day | ORAL | Status: DC

## 2012-10-31 MED ORDER — LEVOTHYROXINE SODIUM 125 MCG PO TABS: 125.0000 ug | ORAL_TABLET | Freq: Every day | ORAL | Status: DC

## 2012-10-31 MED ORDER — VERAPAMIL HCL ER 180 MG PO TBCR: 180.0000 mg | EXTENDED_RELEASE_TABLET | Freq: Every day | ORAL | Status: DC

## 2012-10-31 MED ORDER — MAGNESIUM 200 MG PO TABS: 1.0000 | ORAL_TABLET | ORAL | Status: DC

## 2012-10-31 MED ORDER — BUPROPION HCL ER (SR) 200 MG PO TB12: 200.0000 mg | ORAL_TABLET | Freq: Every day | ORAL | Status: DC

## 2012-10-31 MED ORDER — DONEPEZIL HCL 10 MG PO TABS: 10.0000 mg | ORAL_TABLET | Freq: Every day | ORAL | Status: DC

## 2012-10-31 MED ORDER — CHOLECALCIFEROL 50 MCG (2000 UT) PO TABS: 2000.0000 [IU] | ORAL_TABLET | Freq: Every day | ORAL | Status: AC

## 2012-10-31 MED ORDER — ADULT MULTIVITAMIN W/MINERALS CH: 1.0000 | ORAL_TABLET | Freq: Every day | ORAL | Status: DC

## 2012-10-31 MED ORDER — ATORVASTATIN CALCIUM 20 MG PO TABS: 20.0000 mg | ORAL_TABLET | Freq: Every day | ORAL | Status: DC

## 2012-10-31 MED ORDER — PRAZOSIN HCL 2 MG PO CAPS: 6.0000 mg | ORAL_CAPSULE | Freq: Every day | ORAL | Status: DC

## 2012-10-31 MED ORDER — MONTELUKAST SODIUM 10 MG PO TABS: 10.0000 mg | ORAL_TABLET | Freq: Every day | ORAL | Status: DC

## 2012-10-31 NOTE — [facility] Group Notes (Signed)
Adult Psychoeducational Group Note  Date:  10/31/2012 Time:  10:00am  Group Topic/Focus: Therapeutic Activity - Swing Man Self Esteem Action Plan:   The focus of this group is to help patients create a plan to continue to build self-esteem after discharge.  Participation Level:  Active  Participation Quality:  Appropriate  Affect:  Appropriate  Cognitive:  Appropriate  Insight: Appropriate  Engagement in Group:  Engaged  Modes of Intervention:  Activity  Additional Comments:  Neeti was engaged and answered questions in group.  Caroll Rancher A 10/31/2012, 11:16 AM

## 2012-10-31 NOTE — [facility] Group Notes (Signed)
[facility] LCSW Aftercare Discharge Planning Group Note   10/31/2012 8:45 AM  Participation Quality:  Alert and Appropriate   Mood/Affect:  Appropriate and Calm  Depression Rating: 3   Anxiety Rating:  2  Thoughts of Suicide:  Pt denies SI/HI  Will you contract for safety?   Yes  Current AVH:  No  Plan for Discharge/Comments:  Pt attended discharge planning group and actively participated in group.  CSW provided pt with today's workbook.  Pt reports feeling stable to d/c today.  Pt states that after CSW spoke with pt's sister yesterday, pt spoke with both sister and niece about her needs and how to best support her when she returns home.  Pt states that she has never done this before or spoke up for herself to them before and feels things will be different when she returns home.  Pt has follow up scheduled at [facility] for medication management and therapy. No further needs voiced by pt at this time.    Transportation Means: Pt states that her niece will pick her up today  Supports: Family is supportive  Reyes Ivan, LCSWA 10/31/2012 10:25 AM

## 2012-10-31 NOTE — Progress Notes (Signed)
[facility] Adult Case Management Discharge Plan :  Will you be returning to the same living situation after discharge: Yes,  returning home At discharge, do you have transportation home?:Yes,  niece is picking pt up Do you have the ability to pay for your medications:Yes,  access to meds  Release of information consent forms completed and in the chart;  Patient's signature needed at discharge.  Patient to Follow up at: Follow-up Information   Follow up with [facility] Out[facility] On 11/12/2012. (Appointment scheduled at 10:45 am with Dr. Dan Humphreys for medication management)    Contact information:   [address] [place], [place] [address] [phone]      Follow up with [facility] On 12/02/2012. (Appointment scheduled at 1:15 pm with Dr. Lolly Mustache for medication management)    Contact information:   [address] [place], [place] [address] [phone]      Follow up with [facility] - Outpatient On 11/04/2012. (Appointment scheduled at 2:45 pm with Florencia Reasons for therapy)    Contact information:   [address] [place], [place] [address] [phone]      Patient denies SI/HI:   Yes,  denies SI/HI    Safety Planning and Suicide Prevention discussed:  Yes,  discussed with pt and pt's sister (see suicide prevention note)  Horton, Salome Arnt 10/31/2012, 10:27 AM

## 2012-10-31 NOTE — [facility] Suicide Risk Assessment (Signed)
Suicide Risk Assessment  Discharge Assessment     Demographic Factors:  Adolescent or young adult, Caucasian, Low socioeconomic status, Living alone and Unemployed  Mental Status Per Nursing Assessment::   On Admission:  Self-harm thoughts  Current Mental Status by Physician: She is calm, quiet and cooperative. she is walking with cane. she has good mood and bright affect. she has normal speech and thoughts. she has denied SI/HI. She will be staying with her sister after discharge.  Loss Factors: Financial problems/change in socioeconomic status  Historical Factors: Prior suicide attempts and Family history of mental illness or substance abuse  Risk Reduction Factors:   Sense of responsibility to family, Religious beliefs about death, Living with another person, especially a relative, Positive social support, Positive therapeutic relationship and Positive coping skills or problem solving skills  Continued Clinical Symptoms:  Severe Anxiety and/or Agitation Depression:   Aggression Anhedonia Schizophrenia:   Paranoid or undifferentiated type Previous Psychiatric Diagnoses and Treatments Medical Diagnoses and Treatments/Surgeries  Cognitive Features That Contribute To Risk:  Closed-mindedness Polarized thinking    Suicide Risk:  Minimal: No identifiable suicidal ideation.  Patients presenting with no risk factors but with morbid ruminations; may be classified as minimal risk based on the severity of the depressive symptoms  Discharge Diagnoses:   AXIS I:  Major Depression, Recurrent severe, Post Traumatic Stress Disorder and Schizoaffective Disorder AXIS II:  Dependent Personality and asthma AXIS III:   Past Medical History  Diagnosis Date  . Hyperlipidemia   . CAD (coronary artery disease)   . Bipolar 1 disorder   . Schizophrenia   . GERD (gastroesophageal reflux disease)   . Hyperthyroidism   . Chronic back pain   . Dyspnea     PFT 03/05/09 FEV1 2.77(98%), FVC  3.25(86%), FEV1% 85, TLC 5.88(99%), DLCO 60% ,  Methacholine challenge 03/16/09 normal ,  CT chest 03/12/09 no pulmonary disease  . Anxiety   . Arthritis   . Depression   . OSA on CPAP     2 liters  . HTN (hypertension)   . History of colonoscopy 10/17/2002    by Dr Rehman-> distal non-specific proctitis, small ext hemorrhoids,   . Fungal infection   . Cellulitis   . Contusion of sacrum   . Migraine headache   . Vitamin D deficiency   . Sleep apnea   . Myocardial infarction     NOV 1997  . Hypothyroidism     States she only has hyperthyroidism  . Complication of anesthesia     States she typically gets sick s/p anesthesia  . Morbid obesity with body mass index of 50.0-59.9 in adult JAN 2011 370 LBS    2004 311 BMI 45.9  . Obsessive-compulsive disorder   . PTSD (post-traumatic stress disorder)   . Asthma   . Allergy   . Urine incontinence   . Chronic headaches   . Suicidal ideation   . COPD (chronic obstructive pulmonary disease)    AXIS IV:  economic problems, occupational problems, other psychosocial or environmental problems, problems related to social environment and problems with access to health care services AXIS V:  51-60 moderate symptoms  Plan Of Care/Follow-up recommendations:  Activity:  as tolerated Diet:  regular  Is patient on multiple antipsychotic therapies at discharge:  No   Has Patient had three or more failed trials of antipsychotic monotherapy by history:  No  Recommended Plan for Multiple Antipsychotic Therapies: Not applicable  Allison Sharp,Allison Sharp. 10/31/2012, 9:57 AM

## 2012-10-31 NOTE — Discharge Summary (Signed)
Physician Discharge Summary Note  Patient:  Allison Sharp is an 57 y.o., female MRN:  [id number] DOB:  12-19-1955 Patient phone:  [phone] (home)  Patient address:   [address] [place] [address],   Date of Admission:  10/24/2012 Date of Discharge: 10/31/12  Reason for Admission:  Depression with SI and HI  Discharge Diagnoses: Principal Problem:   Schizoaffective disorder, bipolar type  Review of Systems  Constitutional: Negative.  Negative for fever, chills, weight loss and malaise/fatigue.  HENT: Negative.  Negative for hearing loss, ear pain, neck pain, tinnitus and ear discharge.   Eyes: Negative.   Respiratory: Positive for wheezing (Much decreased from yesterday.). Negative for cough.   Cardiovascular: Negative.   Gastrointestinal: Negative for heartburn, nausea, vomiting, abdominal pain and diarrhea.  Genitourinary: Negative for dysuria, urgency and frequency.  Musculoskeletal: Positive for back pain. Negative for myalgias.  Skin: Negative.  Negative for itching and rash.  Neurological: Negative for dizziness, tingling, tremors, sensory change, speech change and headaches.  Endo/Heme/Allergies: Negative for environmental allergies and polydipsia. Does not bruise/bleed easily.  Psychiatric/Behavioral: Positive for depression. Negative for suicidal ideas, hallucinations, memory loss and substance abuse. The patient is nervous/anxious. The patient does not have insomnia.    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 Hyperlipidemia   . CAD (coronary artery disease)   . Bipolar 1 disorder   . Schizophrenia   . GERD (gastroesophageal reflux disease)   . Hyperthyroidism   . Chronic back pain   . Dyspnea     PFT 03/05/09 FEV1 2.77(98%), FVC 3.25(86%), FEV1% 85, TLC 5.88(99%), DLCO 60% ,   Methacholine challenge 03/16/09 normal ,  CT chest 03/12/09 no pulmonary disease  . Anxiety   . Arthritis   . Depression   . OSA on CPAP     2 liters  . HTN (hypertension)   . History of colonoscopy 10/17/2002    by Dr Rehman-> distal non-specific proctitis, small ext hemorrhoids,   . Fungal infection   . Cellulitis   . Contusion of sacrum   . Migraine headache   . Vitamin D deficiency   . Sleep apnea   . Myocardial infarction     NOV 1997  . Hypothyroidism     States she only has hyperthyroidism  . Complication of anesthesia     States she typically gets sick s/p anesthesia  . Morbid obesity with body mass index of 50.0-59.9 in adult JAN 2011 370 LBS    2004 311 BMI 45.9  . Obsessive-compulsive disorder   . PTSD (post-traumatic stress disorder)   . Asthma   . Allergy   . Urine incontinence   . Chronic headaches   . Suicidal ideation   . COPD (chronic obstructive pulmonary disease)    AXIS IV:  economic problems, occupational problems, other psychosocial or environmental problems, problems related to social environment and problems with access to health care services AXIS V:  61-70 mild symptoms  Level of Care:  OP  Hospital Course:  Allison Sharp is an 57 y.o. Caucasian female referred to [facility] by her psychiatrist Dr. Dan Humphreys. Patient reports she has been more depressed lately, denies any particular reason. Sts that she is increasingly angry and irritable. Per [facility] assessment, patient reports her anger is directed to her family whom she resides with, sister  and niece. Her feelings toward her niece has escalated to homicidal thoughts. Sts that last night she had urges to strangle her niece while sleeping. Patient has felt homicidal for a week or so and also feels suicidal currently. She has a plan to overdose. She sts, "I have plenty of medications to do what I need to do!!". Patient reports 2-3 prior inpatient hospitalizations for suicide attempts (all overdoses). She was admitted to  [facility] (1x) and [facility] (2x's). Patient's reports increased depression and anxiety triggered by recently moving into a new home with her family. Says that boxes are everywhere and she is the only one taking responsibility and unpacking the boxes. Patient says when she left home this am she told her family, "Those darn boxes better be unpacked at least the the living room should be neat". Patient admits to feeling OCD in addition to her diagnosis of depression, schizophrenia, and Bipolar I Disorder.       The duration of stay was seven days. The patient was seen and evaluated by the Treatment team consisting of Psychiatrist, NP-C, RN, Case Manager, and Therapist for evaluation and treatment plan with goal of stabilization upon discharge. The patient's physical and mental health problems were identified and treated appropriately. Allison Sharp complained of having an asthma flare during her admission and was noted to be wheezing heavily upon assessment by midlevel provider. She received prn doses of albuterol nebulizer treatments with significant improvement in symptoms prior to her discharge.       Multiple modalities of treatment were used including medication, individual and group therapies, unit programming, improved nutrition, physical activity, and family sessions as needed. Her medications were managed by the MD. The patient's home medications for numerous medical problems were continued while she was in the hospital. On admission to [facility] her wellbutrin was increased to 300 mg. Patient went to the scheduled groups and working on developing coping skills for her issues with anger and depression. The patient was able to express her feelings to her family reporting that "My sister has heard me. She handles everything for me. I think things will be better when I get home."      The symptoms of depression were monitored daily by evaluation by clinical provider.  The patient's mental and emotional status was  evaluated by a daily self inventory completed by the patient. Improvement was demonstrated by declining numbers on the self assessment, improving vital signs, increased cognition, and improvement in mood, sleep, appetite as well as a reduction in physical symptoms.      The patient was evaluated and found to be stable enough for discharge and was released to home per the initial plan of treatment. Allison Sharp denied any SI or HI prior to her discharge. She was given prescription for changes in medication dosages and instructed to resume her home medications. Allison Sharp was found physically and mentally stable for discharge. Patient was upset to find out during her stay that Dr. Dan Humphreys is leaving the practice but felt good about possibly following up with Dr.Arfeen.   Mental Status Exam:  For mental status exam please see mental status exam and  suicide risk assessment completed by attending physician prior to discharge.  Consults:  psychiatry  Significant Diagnostic Studies:  labs: Chem profile, CBC, UA, UDS  Discharge Vitals:   Blood pressure 125/85, pulse 97, temperature 97 F (36.1 C), temperature source Oral, resp. rate 18, weight 167.831 kg (370 lb), last menstrual period 02/18/1995. Body mass index is 55.82 kg/(m^2).  Lab Results:   No results found for this or any previous visit (from the past 72 hour(s)).  Physical Findings: AIMS: Facial and Oral Movements Muscles of Facial Expression: None, normal Lips and Perioral Area: None, normal Jaw: None, normal Tongue: None, normal,Extremity Movements Upper (arms, wrists, hands, fingers): None, normal Lower (legs, knees, ankles, toes): None, normal, Trunk Movements Neck, shoulders, hips: None, normal, Overall Severity Severity of abnormal movements (highest score from questions above): None, normal Incapacitation due to abnormal movements: None, normal Patient's awareness of abnormal movements (rate only patient's report): No Awareness, Dental  Status Current problems with teeth and/or dentures?: No Does patient usually wear dentures?: No  CIWA:  CIWA-Ar Total: 2 COWS:  COWS Total Score: 2  Psychiatric Specialty Exam: See Psychiatric Specialty Exam and Suicide Risk Assessment completed by Attending Physician prior to discharge.  Discharge destination:  Home  Is patient on multiple antipsychotic therapies at discharge:  No   Has Patient had three or more failed trials of antipsychotic monotherapy by history:  No  Recommended Plan for Multiple Antipsychotic Therapies: N/A  Discharge Orders   Future Appointments Provider Department Dept Phone   11/11/2012 8:30 AM Casimiro Needle L. Smiley Houseman, MD [facility] [phone]   12/24/2012 9:15 AM Hart Carwin, MD [facility] [phone]   Future Orders Complete By Expires     Activity as tolerated - No restrictions  As directed     Diet - low sodium heart healthy  As directed         Medication List    STOP taking these medications       mirtazapine 15 MG tablet  Commonly known as:  REMERON      TAKE these medications     Indication   albuterol 108 (90 BASE) MCG/ACT inhaler  Commonly known as:  PROVENTIL HFA;VENTOLIN HFA  Inhale 2 puffs into the lungs every 6 (six) hours as needed for wheezing or shortness of breath.   Indication:  Chronic Obstructive Lung Disease     amitriptyline 50 MG tablet  Commonly known as:  ELAVIL  Take 1 tablet (50 mg total) by mouth at bedtime.   Indication:  Depression, Neurogenic Pain     aspirin 81 MG chewable tablet  Chew 1 tablet (81 mg total) by mouth daily. For blood thinner.   Indication:  Mild to Moderate Pain, Joint Damage causing Pain and Loss of Function     atorvastatin 20 MG tablet  Commonly known as:  LIPITOR  Take 1 tablet (20 mg total) by mouth daily.   Indication:  Disease of the Heart and Blood Vessels     buPROPion 200 MG 12 hr tablet  Commonly known as:  WELLBUTRIN SR  Take 1 tablet  (200 mg total) by mouth daily with breakfast. For depression   Indication:  Major Depressive Disorder     cetirizine 10 MG tablet  Commonly known as:  ZYRTEC  Take 1 tablet (10 mg total) by mouth daily.   Indication:  Hayfever     Cholecalciferol 2000 UNITS Tabs  Take 1 tablet (2,000 Units total) by mouth daily with breakfast. Nutritional supplement.   Indication:  Vitamin D Deficiency     divalproex 500 MG 24 hr tablet  Commonly known as:  DEPAKOTE ER  Take 3 tablets (1,500 mg total) by mouth at bedtime. On occasions may take one during the day for extreme stress.   Indication:  mood stability     donepezil 10 MG tablet  Commonly known as:  ARICEPT  Take 1 tablet (10 mg total) by mouth daily with breakfast.   Indication:  Alzheimer's Disease     DULoxetine 60 MG capsule  Commonly known as:  CYMBALTA  Take 1 capsule (60 mg total) by mouth 2 (two) times daily.   Indication:  Major Depressive Disorder, Musculoskeletal Pain     fluticasone 50 MCG/ACT nasal spray  Commonly known as:  FLONASE  Place 1 spray into the nose 2 (two) times daily.   Indication:  Nonallergic Rhinitis     furosemide 20 MG tablet  Commonly known as:  LASIX  Take 1.5 tablets (30 mg total) by mouth daily. For blood pressure control.   Indication:  Edema, High Blood Pressure     gabapentin 100 MG capsule  Commonly known as:  NEURONTIN  Take 2 capsules (200 mg total) by mouth 3 (three) times daily. For pain and mood control   Indication:  Neuropathic Pain     levothyroxine 125 MCG tablet  Commonly known as:  SYNTHROID, LEVOTHROID  Take 1 tablet (125 mcg total) by mouth daily before breakfast. For hypothyroidism.   Indication:  Underactive Thyroid     Magnesium 200 MG Tabs  Take 1 tablet (200 mg total) by mouth 1 day or 1 dose.   Indication:  low magnesium levels     montelukast 10 MG tablet  Commonly known as:  SINGULAIR  Take 1 tablet (10 mg total) by mouth at bedtime.   Indication:  Asthma      multivitamin with minerals Tabs  Take 1 tablet by mouth daily. For nutritional supplementation.   Indication:  vitamin supplementation     niacin 500 MG CR tablet  Commonly known as:  NIASPAN  TAKE ONE TABLET AT BEDTIME      nystatin 100000 UNIT/GM Powd  Apply 1 g topically 3 (three) times daily as needed (redness and moisture).   Indication:  Skin Infection due to Candida Yeast     omeprazole 20 MG capsule  Commonly known as:  PRILOSEC  Take 1 capsule (20 mg total) by mouth daily.   Indication:  Gastroesophageal Reflux Disease with Current Symptoms     polyethylene glycol packet  Commonly known as:  MIRALAX / GLYCOLAX  Take 17 g by mouth daily.   Indication:  Constipation     prazosin 2 MG capsule  Commonly known as:  MINIPRESS  Take 3-4 capsules (6-8 mg total) by mouth at bedtime. For insomnia   Indication:  High Blood Pressure     sennosides-docusate sodium 8.6-50 MG tablet  Commonly known as:  SENOKOT-S  Take 1 tablet by mouth daily.   Indication:  Constipation     verapamil 180 MG CR tablet  Commonly known as:  CALAN-SR  Take 1 tablet (180 mg total) by mouth daily.   Indication:  High Blood Pressure of Unknown Cause           Follow-up Information   Follow up with [facility] - Outpatient On 11/12/2012. (Appointment scheduled at 10:45 am with Dr. Dan Humphreys for medication management)    Contact information:   [address] [place] [place], [place] [address] [phone]      Follow up with [facility] - Outpatient On 12/02/2012. (Appointment scheduled at 1:15 pm with Dr. Lolly Mustache for medication management)    Contact information:   [address] [place], [place] [address] [phone]      Follow up with [facility] -  Outpatient On 11/04/2012. (Appointment scheduled at 2:45 pm with Florencia Reasons for therapy)    Contact information:   [address], [address] [place], [place] [address] [phone]      Follow-up recommendations:   Activity:  Resume usual activities Diet:  Regular  Comments:   Take all your medications as prescribed by your mental healthcare provider.  Report any adverse effects and or reactions from your medicines to your outpatient provider promptly.  Patient is instructed and cautioned to not engage in alcohol and or illegal drug use while on prescription medicines.  In the event of worsening symptoms, patient is instructed to call the crisis hotline, 911 and or go to the nearest ED for appropriate evaluation and treatment of symptoms.  Follow-up with your primary care provider for your other medical issues, concerns and or health care needs.   Total Discharge Time:  Greater than 30 minutes.  SignedFransisca Kaufmann NP-C 10/31/2012, 11:52 AM  Patient is personally seen, examined for suicidal risk assessment, case discussed with case manager and physician extender. Reviewed the information documented and agree with the treatment plan.  Delyle Weider,JANARDHAHA R. 10/31/2012 6:46 PM

## 2012-10-31 NOTE — Progress Notes (Signed)
Allison Sharp was discharged home with her family.  Discharge instructions, follow up information and prescriptions given to patient upon discharge.  Patient verbalized understanding.  Personal belongings returned to patient from her locker.  Patient denies SI/HI/AVH at this time and she is very positive about returning home.

## 2012-11-04 ENCOUNTER — Emergency Department (HOSPITAL_COMMUNITY): Payer: Medicare Other

## 2012-11-04 ENCOUNTER — Ambulatory Visit (INDEPENDENT_AMBULATORY_CARE_PROVIDER_SITE_OTHER): Payer: No Typology Code available for payment source | Admitting: Psychiatry

## 2012-11-04 ENCOUNTER — Encounter (HOSPITAL_COMMUNITY): Payer: Self-pay

## 2012-11-04 ENCOUNTER — Inpatient Hospital Stay (HOSPITAL_COMMUNITY)
Admission: EM | Admit: 2012-11-04 | Discharge: 2012-11-07 | DRG: 190 | Disposition: A | Discharge status: Home Health | Payer: Medicare Other | Attending: Internal Medicine | Admitting: Internal Medicine

## 2012-11-04 DIAGNOSIS — F411 Generalized anxiety disorder: Secondary | ICD-10-CM | POA: Diagnosis present

## 2012-11-04 DIAGNOSIS — G43909 Migraine, unspecified, not intractable, without status migrainosus: Secondary | ICD-10-CM | POA: Diagnosis present

## 2012-11-04 DIAGNOSIS — I2510 Atherosclerotic heart disease of native coronary artery without angina pectoris: Secondary | ICD-10-CM

## 2012-11-04 DIAGNOSIS — F319 Bipolar disorder, unspecified: Secondary | ICD-10-CM

## 2012-11-04 DIAGNOSIS — R45850 Homicidal ideations: Secondary | ICD-10-CM

## 2012-11-04 DIAGNOSIS — Z96659 Presence of unspecified artificial knee joint: Secondary | ICD-10-CM

## 2012-11-04 DIAGNOSIS — F5105 Insomnia due to other mental disorder: Secondary | ICD-10-CM

## 2012-11-04 DIAGNOSIS — K219 Gastro-esophageal reflux disease without esophagitis: Secondary | ICD-10-CM

## 2012-11-04 DIAGNOSIS — I10 Essential (primary) hypertension: Secondary | ICD-10-CM | POA: Diagnosis present

## 2012-11-04 DIAGNOSIS — F419 Anxiety disorder, unspecified: Secondary | ICD-10-CM

## 2012-11-04 DIAGNOSIS — F332 Major depressive disorder, recurrent severe without psychotic features: Secondary | ICD-10-CM

## 2012-11-04 DIAGNOSIS — F4310 Post-traumatic stress disorder, unspecified: Secondary | ICD-10-CM

## 2012-11-04 DIAGNOSIS — F250 Schizoaffective disorder, bipolar type: Secondary | ICD-10-CM

## 2012-11-04 DIAGNOSIS — K7689 Other specified diseases of liver: Secondary | ICD-10-CM

## 2012-11-04 DIAGNOSIS — F17200 Nicotine dependence, unspecified, uncomplicated: Secondary | ICD-10-CM | POA: Diagnosis present

## 2012-11-04 DIAGNOSIS — E559 Vitamin D deficiency, unspecified: Secondary | ICD-10-CM

## 2012-11-04 DIAGNOSIS — Z66 Do not resuscitate: Secondary | ICD-10-CM | POA: Diagnosis present

## 2012-11-04 DIAGNOSIS — G4700 Insomnia, unspecified: Secondary | ICD-10-CM | POA: Diagnosis present

## 2012-11-04 DIAGNOSIS — I252 Old myocardial infarction: Secondary | ICD-10-CM

## 2012-11-04 DIAGNOSIS — J9600 Acute respiratory failure, unspecified whether with hypoxia or hypercapnia: Secondary | ICD-10-CM

## 2012-11-04 DIAGNOSIS — J441 Chronic obstructive pulmonary disease with (acute) exacerbation: Principal | ICD-10-CM

## 2012-11-04 DIAGNOSIS — F429 Obsessive-compulsive disorder, unspecified: Secondary | ICD-10-CM

## 2012-11-04 DIAGNOSIS — E785 Hyperlipidemia, unspecified: Secondary | ICD-10-CM | POA: Diagnosis present

## 2012-11-04 DIAGNOSIS — Z9989 Dependence on other enabling machines and devices: Secondary | ICD-10-CM | POA: Diagnosis present

## 2012-11-04 DIAGNOSIS — G4733 Obstructive sleep apnea (adult) (pediatric): Secondary | ICD-10-CM | POA: Diagnosis present

## 2012-11-04 DIAGNOSIS — J45901 Unspecified asthma with (acute) exacerbation: Secondary | ICD-10-CM | POA: Diagnosis present

## 2012-11-04 DIAGNOSIS — E039 Hypothyroidism, unspecified: Secondary | ICD-10-CM

## 2012-11-04 DIAGNOSIS — G8929 Other chronic pain: Secondary | ICD-10-CM

## 2012-11-04 DIAGNOSIS — R413 Other amnesia: Secondary | ICD-10-CM

## 2012-11-04 DIAGNOSIS — J4541 Moderate persistent asthma with (acute) exacerbation: Secondary | ICD-10-CM

## 2012-11-04 DIAGNOSIS — Z6841 Body Mass Index (BMI) 40.0 and over, adult: Secondary | ICD-10-CM

## 2012-11-04 DIAGNOSIS — E7800 Pure hypercholesterolemia, unspecified: Secondary | ICD-10-CM

## 2012-11-04 DIAGNOSIS — Z79899 Other long term (current) drug therapy: Secondary | ICD-10-CM

## 2012-11-04 LAB — URINALYSIS, ROUTINE W REFLEX MICROSCOPIC
Nitrite: NEGATIVE
Specific Gravity, Urine: 1.01 (ref 1.005–1.030)
Urobilinogen, UA: 0.2 mg/dL (ref 0.0–1.0)
pH: 8 (ref 5.0–8.0)

## 2012-11-04 LAB — CBC WITH DIFFERENTIAL/PLATELET
Basophils Absolute: 0.1 10*3/uL (ref 0.0–0.1)
HCT: 38.3 % (ref 36.0–46.0)
Lymphocytes Relative: 35 % (ref 12–46)
Lymphs Abs: 3.2 10*3/uL (ref 0.7–4.0)
Monocytes Absolute: 0.6 10*3/uL (ref 0.1–1.0)
Neutro Abs: 4.6 10*3/uL (ref 1.7–7.7)
Platelets: 158 10*3/uL (ref 150–400)
RBC: 4.17 MIL/uL (ref 3.87–5.11)
RDW: 12.9 % (ref 11.5–15.5)
WBC: 9 10*3/uL (ref 4.0–10.5)

## 2012-11-04 LAB — TROPONIN I: Troponin I: <0.3 ng/mL (ref <0.30)

## 2012-11-04 LAB — BASIC METABOLIC PANEL
CO2: 29 mEq/L (ref 19–32)
Chloride: 101 mEq/L (ref 96–112)
Creatinine, Ser: 0.81 mg/dL (ref 0.50–1.10)
GFR calc Af Amer: >90 mL/min (ref >90)
Sodium: 139 mEq/L (ref 135–145)

## 2012-11-04 MED ORDER — TRAZODONE HCL 50 MG PO TABS
100.0000 mg | ORAL_TABLET | Freq: Every evening | ORAL | Status: DC | PRN
  Administered 2012-11-04 – 2012-11-06 (×3): 100 mg via ORAL
  Filled 2012-11-04 (×3): qty 2

## 2012-11-04 MED ORDER — LORATADINE 10 MG PO TABS
10.0000 mg | ORAL_TABLET | Freq: Every day | ORAL | Status: DC
  Administered 2012-11-05 – 2012-11-07 (×3): 10 mg via ORAL
  Filled 2012-11-04 (×3): qty 1

## 2012-11-04 MED ORDER — OXYCODONE HCL 5 MG PO TABS: 5.0000 mg | ORAL_TABLET | ORAL | Status: DC | PRN

## 2012-11-04 MED ORDER — ACETAMINOPHEN 650 MG RE SUPP: 650.0000 mg | Freq: Four times a day (QID) | RECTAL | Status: DC | PRN

## 2012-11-04 MED ORDER — SODIUM CHLORIDE 0.9 % IV SOLN: 250.0000 mL | INTRAVENOUS | Status: DC | PRN

## 2012-11-04 MED ORDER — PREDNISONE 50 MG PO TABS
60.0000 mg | ORAL_TABLET | Freq: Once | ORAL | Status: AC
  Administered 2012-11-04: 60 mg via ORAL
  Filled 2012-11-04: qty 1

## 2012-11-04 MED ORDER — GABAPENTIN 100 MG PO CAPS
200.0000 mg | ORAL_CAPSULE | Freq: Three times a day (TID) | ORAL | Status: DC
  Administered 2012-11-04 – 2012-11-07 (×9): 200 mg via ORAL
  Filled 2012-11-04 (×3): qty 2
  Filled 2012-11-04: qty 1
  Filled 2012-11-04 (×6): qty 2

## 2012-11-04 MED ORDER — POLYETHYLENE GLYCOL 3350 17 G PO PACK
17.0000 g | PACK | Freq: Every day | ORAL | Status: DC
  Administered 2012-11-05 – 2012-11-07 (×3): 17 g via ORAL
  Filled 2012-11-04 (×3): qty 1

## 2012-11-04 MED ORDER — IPRATROPIUM BROMIDE 0.02 % IN SOLN
0.5000 mg | Freq: Four times a day (QID) | RESPIRATORY_TRACT | Status: DC
  Administered 2012-11-04 – 2012-11-07 (×8): 0.5 mg via RESPIRATORY_TRACT
  Filled 2012-11-04 (×9): qty 2.5

## 2012-11-04 MED ORDER — NIACIN ER (ANTIHYPERLIPIDEMIC) 500 MG PO TBCR
500.0000 mg | EXTENDED_RELEASE_TABLET | Freq: Every day | ORAL | Status: DC
  Administered 2012-11-04 – 2012-11-06 (×3): 500 mg via ORAL
  Filled 2012-11-04 (×4): qty 1

## 2012-11-04 MED ORDER — DULOXETINE HCL 60 MG PO CPEP
60.0000 mg | ORAL_CAPSULE | Freq: Two times a day (BID) | ORAL | Status: DC
  Administered 2012-11-04 – 2012-11-07 (×6): 60 mg via ORAL
  Filled 2012-11-04 (×6): qty 1

## 2012-11-04 MED ORDER — FUROSEMIDE 20 MG PO TABS
30.0000 mg | ORAL_TABLET | Freq: Every day | ORAL | Status: DC
  Administered 2012-11-05 – 2012-11-07 (×3): 30 mg via ORAL
  Filled 2012-11-04 (×3): qty 2

## 2012-11-04 MED ORDER — ALBUTEROL SULFATE (5 MG/ML) 0.5% IN NEBU
10.0000 mg | INHALATION_SOLUTION | Freq: Once | RESPIRATORY_TRACT | Status: AC
  Administered 2012-11-04: 10 mg via RESPIRATORY_TRACT
  Filled 2012-11-04: qty 2

## 2012-11-04 MED ORDER — FLUTICASONE PROPIONATE 50 MCG/ACT NA SUSP
1.0000 | Freq: Two times a day (BID) | NASAL | Status: DC
  Administered 2012-11-04 – 2012-11-07 (×6): 1 via NASAL
  Filled 2012-11-04: qty 16

## 2012-11-04 MED ORDER — DONEPEZIL HCL 5 MG PO TABS
10.0000 mg | ORAL_TABLET | Freq: Every day | ORAL | Status: DC
  Administered 2012-11-05 – 2012-11-07 (×3): 10 mg via ORAL
  Filled 2012-11-04 (×4): qty 2

## 2012-11-04 MED ORDER — VERAPAMIL HCL ER 180 MG PO TBCR
180.0000 mg | EXTENDED_RELEASE_TABLET | Freq: Every morning | ORAL | Status: DC
  Administered 2012-11-05 – 2012-11-07 (×3): 180 mg via ORAL
  Filled 2012-11-04 (×4): qty 1

## 2012-11-04 MED ORDER — SODIUM CHLORIDE 0.9 % IJ SOLN: 3.0000 mL | INTRAMUSCULAR | Status: DC | PRN

## 2012-11-04 MED ORDER — SENNOSIDES-DOCUSATE SODIUM 8.6-50 MG PO TABS
1.0000 | ORAL_TABLET | Freq: Every day | ORAL | Status: DC
  Administered 2012-11-04 – 2012-11-07 (×4): 1 via ORAL
  Filled 2012-11-04 (×4): qty 1

## 2012-11-04 MED ORDER — BUPROPION HCL ER (SR) 100 MG PO TB12
200.0000 mg | ORAL_TABLET | Freq: Every day | ORAL | Status: DC
  Administered 2012-11-04 – 2012-11-06 (×3): 200 mg via ORAL
  Filled 2012-11-04 (×4): qty 2

## 2012-11-04 MED ORDER — PREDNISONE 10 MG PO TABS
50.0000 mg | ORAL_TABLET | Freq: Every day | ORAL | Status: DC
  Administered 2012-11-05: 50 mg via ORAL
  Filled 2012-11-04: qty 2

## 2012-11-04 MED ORDER — ACETAMINOPHEN 325 MG PO TABS
650.0000 mg | ORAL_TABLET | Freq: Four times a day (QID) | ORAL | Status: DC | PRN
  Administered 2012-11-04 – 2012-11-06 (×3): 650 mg via ORAL
  Filled 2012-11-04 (×3): qty 2

## 2012-11-04 MED ORDER — AMITRIPTYLINE HCL 25 MG PO TABS
50.0000 mg | ORAL_TABLET | Freq: Every day | ORAL | Status: DC
  Administered 2012-11-04 – 2012-11-06 (×3): 50 mg via ORAL
  Filled 2012-11-04 (×3): qty 2

## 2012-11-04 MED ORDER — ALBUTEROL SULFATE (5 MG/ML) 0.5% IN NEBU: 2.5000 mg | INHALATION_SOLUTION | RESPIRATORY_TRACT | Status: DC

## 2012-11-04 MED ORDER — SODIUM CHLORIDE 0.9 % IV SOLN: INTRAVENOUS | Status: AC

## 2012-11-04 MED ORDER — IPRATROPIUM BROMIDE 0.02 % IN SOLN
1.0000 mg | Freq: Once | RESPIRATORY_TRACT | Status: AC
  Administered 2012-11-04: 1 mg via RESPIRATORY_TRACT
  Filled 2012-11-04: qty 5

## 2012-11-04 MED ORDER — ALBUTEROL SULFATE (5 MG/ML) 0.5% IN NEBU: 2.5000 mg | INHALATION_SOLUTION | RESPIRATORY_TRACT | Status: DC | PRN

## 2012-11-04 MED ORDER — ENOXAPARIN SODIUM 80 MG/0.8ML ~~LOC~~ SOLN
80.0000 mg | SUBCUTANEOUS | Status: DC
  Administered 2012-11-04: 80 mg via SUBCUTANEOUS
  Filled 2012-11-04: qty 0.8

## 2012-11-04 MED ORDER — ALBUTEROL SULFATE (5 MG/ML) 0.5% IN NEBU
2.5000 mg | INHALATION_SOLUTION | Freq: Four times a day (QID) | RESPIRATORY_TRACT | Status: DC
  Administered 2012-11-04 – 2012-11-07 (×8): 2.5 mg via RESPIRATORY_TRACT
  Filled 2012-11-04 (×9): qty 0.5

## 2012-11-04 MED ORDER — FLUTICASONE PROPIONATE 50 MCG/ACT NA SUSP
NASAL | Status: AC
  Filled 2012-11-04: qty 16

## 2012-11-04 MED ORDER — NIACIN ER 250 MG PO CPCR
ORAL_CAPSULE | ORAL | Status: AC
  Filled 2012-11-04: qty 2

## 2012-11-04 MED ORDER — SODIUM CHLORIDE 0.9 % IJ SOLN
3.0000 mL | Freq: Two times a day (BID) | INTRAMUSCULAR | Status: DC
  Administered 2012-11-04 – 2012-11-07 (×6): 3 mL via INTRAVENOUS

## 2012-11-04 MED ORDER — ATORVASTATIN CALCIUM 20 MG PO TABS
20.0000 mg | ORAL_TABLET | Freq: Every day | ORAL | Status: DC
  Administered 2012-11-04 – 2012-11-06 (×3): 20 mg via ORAL
  Filled 2012-11-04 (×3): qty 1

## 2012-11-04 MED ORDER — DIVALPROEX SODIUM ER 500 MG PO TB24
1500.0000 mg | ORAL_TABLET | Freq: Every day | ORAL | Status: DC
  Administered 2012-11-04 – 2012-11-06 (×3): 1500 mg via ORAL
  Filled 2012-11-04 (×3): qty 3

## 2012-11-04 MED ORDER — ASPIRIN 81 MG PO CHEW
81.0000 mg | CHEWABLE_TABLET | Freq: Every day | ORAL | Status: DC
  Administered 2012-11-05 – 2012-11-06 (×3): 81 mg via ORAL
  Filled 2012-11-04 (×3): qty 1

## 2012-11-04 MED ORDER — MONTELUKAST SODIUM 10 MG PO TABS
10.0000 mg | ORAL_TABLET | Freq: Every day | ORAL | Status: DC
  Administered 2012-11-04 – 2012-11-06 (×3): 10 mg via ORAL
  Filled 2012-11-04 (×3): qty 1

## 2012-11-04 MED ORDER — LEVOTHYROXINE SODIUM 25 MCG PO TABS
125.0000 ug | ORAL_TABLET | Freq: Every day | ORAL | Status: DC
  Administered 2012-11-05 – 2012-11-07 (×3): 125 ug via ORAL
  Filled 2012-11-04 (×3): qty 1

## 2012-11-04 NOTE — H&P (Signed)
PCP:   Cindra Presume, FNP   Chief Complaint:  Shortness of breath  HPI: 57 year old female with a history of COPD, obesity sleep apnea, major depression who [facility] [place] after she was admitted for suicidal ideations. Patient says that she has been having shortness of breath over the past one week, she also admits to have cough but no phlegm. She denies fever. Patient says that she tried to use albuterol inhaler which did not help her. She denies chest pain, denies nausea vomiting or diarrhea. Denies dysuria urgency or frequency of urination Allergies:   Allergies  Allergen Reactions  . Haldol (Haloperidol) Other (See Comments)    Hallucinating   . Ativan (Lorazepam) Other (See Comments)    Delirium  . Naproxen Nausea And Vomiting      Past Medical History  Diagnosis Date  . Hyperlipidemia   . CAD (coronary artery disease)   . Bipolar 1 disorder   . GERD (gastroesophageal reflux disease)   . Hyperthyroidism   . Chronic back pain   . Dyspnea     PFT 03/05/09 FEV1 2.77(98%), FVC 3.25(86%), FEV1% 85, TLC 5.88(99%), DLCO 60% ,  Methacholine challenge 03/16/09 normal ,  CT chest 03/12/09 no pulmonary disease  . Anxiety   . Arthritis   . Depression   . OSA on CPAP     2 liters  . HTN (hypertension)   . History of colonoscopy 10/17/2002    by Dr Rehman-> distal non-specific proctitis, small ext hemorrhoids,   . Fungal infection   . Cellulitis   . Contusion of sacrum   . Migraine headache   . Vitamin D deficiency   . Sleep apnea   . Myocardial infarction     NOV 1997  . Hypothyroidism     States she only has hyperthyroidism  . Complication of anesthesia     States she typically gets sick s/p anesthesia  . Morbid obesity with body mass index of 50.0-59.9 in adult JAN 2011 370 LBS    2004 311 BMI 45.9  . Obsessive-compulsive disorder   . PTSD (post-traumatic stress disorder)   . Asthma   . Allergy   . Urine incontinence    . Chronic headaches   . Suicidal ideation   . COPD (chronic obstructive pulmonary disease)   . Schizoaffective disorder, bipolar type     Past Surgical History  Procedure Laterality Date  . Cholecystectomy    . Knee arthroscopy    . Appendectomy    . Tonsillectomy    . Back surgery  2008  . Total vaginal hysterectomy    . Abdominal hysterectomy      sept 1996  . Tubal ligation    . Colonoscopy  10/17/2002     Distal proctitis, small external hemorrhoids, otherwise/  normal colonoscopy. Suspect rectal bleeding secondary to hemorrhoids  . Esophagogastroduodenoscopy  03/18/09    fundic gland polyps/mild gastritis  . Cardiac catheterization      nov 1997  . Joint replacement      bil knee replacement  . Hernia repair  1978    Prior to Admission medications   Medication Sig Start Date End Date Taking? Authorizing Provider  albuterol (PROVENTIL HFA;VENTOLIN HFA) 108 (90 BASE) MCG/ACT inhaler Inhale 2 puffs into the lungs every 6 (six) hours as needed for wheezing or shortness of breath. 10/31/12 10/31/13 Yes Fransisca Kaufmann, NP  amitriptyline (ELAVIL) 50 MG tablet Take 1 tablet (50 mg total) by mouth at bedtime.  10/31/12  Yes Fransisca Kaufmann, NP  aspirin 81 MG chewable tablet Chew 1 tablet (81 mg total) by mouth daily. For blood thinner. 10/31/12  Yes Fransisca Kaufmann, NP  atorvastatin (LIPITOR) 20 MG tablet Take 20 mg by mouth at bedtime. 10/31/12  Yes Fransisca Kaufmann, NP  buPROPion [facility] SR) 200 MG 12 hr tablet Take 200 mg by mouth at bedtime. For depression 10/31/12  Yes Fransisca Kaufmann, NP  cetirizine (ZYRTEC) 10 MG tablet Take 10 mg by mouth every morning. 10/31/12  Yes Fransisca Kaufmann, NP  Cholecalciferol 2000 UNITS TABS Take 1 tablet (2,000 Units total) by mouth daily with breakfast. Nutritional supplement. 10/31/12 10/31/13 Yes Fransisca Kaufmann, NP  divalproex (DEPAKOTE ER) 500 MG 24 hr tablet Take 3 tablets (1,500 mg total) by mouth at bedtime. On occasions may take one during the day for extreme stress. 10/31/12  Yes  Fransisca Kaufmann, NP  donepezil (ARICEPT) 10 MG tablet Take 1 tablet (10 mg total) by mouth daily with breakfast. 10/31/12  Yes Fransisca Kaufmann, NP  DULoxetine (CYMBALTA) 60 MG capsule Take 1 capsule (60 mg total) by mouth 2 (two) times daily. 10/31/12  Yes Fransisca Kaufmann, NP  fluticasone (FLONASE) 50 MCG/ACT nasal spray Place 1 spray into the nose 2 (two) times daily. 10/31/12  Yes Fransisca Kaufmann, NP  furosemide (LASIX) 20 MG tablet Take 1.5 tablets (30 mg total) by mouth daily. For blood pressure control. 10/31/12  Yes Fransisca Kaufmann, NP  gabapentin (NEURONTIN) 100 MG capsule Take 2 capsules (200 mg total) by mouth 3 (three) times daily. For pain and mood control 10/31/12  Yes Fransisca Kaufmann, NP  levothyroxine (SYNTHROID, LEVOTHROID) 125 MCG tablet Take 1 tablet (125 mcg total) by mouth daily before breakfast. For hypothyroidism. 10/31/12 10/31/13 Yes Fransisca Kaufmann, NP  Magnesium 200 MG TABS Take 1 tablet by mouth daily. 10/31/12  Yes Fransisca Kaufmann, NP  montelukast (SINGULAIR) 10 MG tablet Take 1 tablet (10 mg total) by mouth at bedtime. 10/31/12  Yes Fransisca Kaufmann, NP  Multiple Vitamin (MULTIVITAMIN WITH MINERALS) TABS Take 1 tablet by mouth daily. For nutritional supplementation. 10/31/12  Yes Fransisca Kaufmann, NP  niacin (NIASPAN) 500 MG CR tablet Take 500 mg by mouth at bedtime.   Yes Historical Provider, MD  nystatin (MYCOSTATIN/NYSTOP) 100000 UNIT/GM POWD Apply 1 g topically 3 (three) times daily as needed (redness and moisture). 10/31/12  Yes Fransisca Kaufmann, NP  omeprazole (PRILOSEC) 20 MG capsule Take 1 capsule (20 mg total) by mouth daily. 10/31/12  Yes Fransisca Kaufmann, NP  polyethylene glycol (MIRALAX / GLYCOLAX) packet Take 17 g by mouth daily. 10/31/12  Yes Fransisca Kaufmann, NP  traZODone (DESYREL) 100 MG tablet Take 1 tablet (100 mg total) by mouth at bedtime as needed for sleep or depression. 10/31/12  Yes Fransisca Kaufmann, NP  verapamil (CALAN-SR) 180 MG CR tablet Take 180 mg by mouth every morning. 10/31/12  Yes Fransisca Kaufmann, NP  sennosides-docusate sodium  (SENOKOT-S) 8.6-50 MG tablet Take 1 tablet by mouth daily. 10/31/12   Fransisca Kaufmann, NP    Social History:  reports that she has been passively smoking.  She has never used smokeless tobacco. She reports that she does not drink alcohol or use illicit drugs.  Family History  Problem Relation Age of Onset  . Cancer Mother   . Hypertension Mother   . Bipolar disorder Mother   . Dementia Mother   . Depression Mother   . Coronary artery disease Father   . Alcohol abuse Father   . Hypertension  Brother   . Coronary artery disease Brother   . Bipolar disorder Brother   . Depression Brother   . Anesthesia problems Neg Hx   . Hypotension Neg Hx   . Malignant hyperthermia Neg Hx   . Pseudochol deficiency Neg Hx   . Depression Sister   . Paranoid behavior Sister   . Bipolar disorder Sister   . Depression Sister   . Hypertension Sister   . Cancer Son     thyroid    Review of Systems:  HEENT: Denies headache, blurred vision, runny nose, sore throat,  Neck: Positive history of thyroid problems,lymphadenopathy Chest : See history of present illness Heart : Denies Chest pain,  positive history of coronary arterey disease GI: Denies  nausea, vomiting, diarrhea, constipation GU: Denies dysuria, urgency, frequency of urination, hematuria Neuro: Denies stroke, seizures, syncope Psych: Positive history of depression, bipolar disorder, she denies suicidal ideations at this time   Physical Exam: Blood pressure 123/55, pulse 92, temperature 98 F (36.7 C), resp. rate 14, height 5' 8.5" (1.74 m), weight 167.831 kg (370 lb), last menstrual period 02/18/1995, SpO2 95.00%. Constitutional:   Patient is a well-developed and well-nourished female* in no acute distress and cooperative with exam. Head: Normocephalic and atraumatic Mouth: Mucus membranes moist Eyes: PERRL, EOMI, conjunctivae normal Neck: Supple, No Thyromegaly Cardiovascular: RRR, S1 normal, S2 normal Pulmonary/Chest: Bilateral  wheezing Abdominal: Soft. Non-tender, non-distended, bowel sounds are normal, no masses, organomegaly, or guarding present.  Neurological: A&O x3, Strenght is normal and symmetric bilaterally, cranial nerve II-XII are grossly intact, no focal motor deficit, sensory intact to light touch bilaterally.  Extremities : Trace edema in the lower extremities   Labs on Admission:  Results for orders placed during the hospital encounter of 11/04/12 (from the past 48 hour(s))  BASIC METABOLIC PANEL     Status: Abnormal   Collection Time    11/04/12  4:34 PM      Result Value Range   Sodium 139  135 - 145 mEq/L   Potassium 3.9  3.5 - 5.1 mEq/L   Chloride 101  96 - 112 mEq/L   CO2 29  19 - 32 mEq/L   Glucose, Bld 100 (*) 70 - 99 mg/dL   BUN 14  6 - 23 mg/dL   Creatinine, Ser 5.40  0.50 - 1.10 mg/dL   Calcium 9.1  8.4 - 98.1 mg/dL   GFR calc non Af Amer 79 (*) >90 mL/min   GFR calc Af Amer >90  >90 mL/min   Comment:            The eGFR has been calculated     using the CKD EPI equation.     This calculation has not been     validated in all clinical     situations.     eGFR's persistently     <90 mL/min signify     possible Chronic Kidney Disease.  CBC WITH DIFFERENTIAL     Status: Abnormal   Collection Time    11/04/12  4:34 PM      Result Value Range   WBC 9.0  4.0 - 10.5 K/uL   RBC 4.17  3.87 - 5.11 MIL/uL   Hemoglobin 13.0  12.0 - 15.0 g/dL   HCT 19.1  47.8 - 29.5 %   MCV 91.8  78.0 - 100.0 fL   MCH 31.2  26.0 - 34.0 pg   MCHC 33.9  30.0 - 36.0 g/dL   RDW 62.1  30.8 -  15.5 %   Platelets 158  150 - 400 K/uL   Neutrophils Relative % 52  43 - 77 %   Neutro Abs 4.6  1.7 - 7.7 K/uL   Lymphocytes Relative 35  12 - 46 %   Lymphs Abs 3.2  0.7 - 4.0 K/uL   Monocytes Relative 7  3 - 12 %   Monocytes Absolute 0.6  0.1 - 1.0 K/uL   Eosinophils Relative 6 (*) 0 - 5 %   Eosinophils Absolute 0.5  0.0 - 0.7 K/uL   Basophils Relative 1  0 - 1 %   Basophils Absolute 0.1  0.0 - 0.1 K/uL   TROPONIN I     Status: None   Collection Time    11/04/12  4:34 PM      Result Value Range   Troponin I <0.30  <0.30 ng/mL   Comment:            Due to the release kinetics of cTnI,     a negative result within the first hours     of the onset of symptoms does not rule out     myocardial infarction with certainty.     If myocardial infarction is still suspected,     repeat the test at appropriate intervals.  URINALYSIS, ROUTINE W REFLEX MICROSCOPIC     Status: None   Collection Time    11/04/12  5:35 PM      Result Value Range   Color, Urine YELLOW  YELLOW   APPearance CLEAR  CLEAR   Specific Gravity, Urine 1.010  1.005 - 1.030   pH 8.0  5.0 - 8.0   Glucose, UA NEGATIVE  NEGATIVE mg/dL   Hgb urine dipstick NEGATIVE  NEGATIVE   Bilirubin Urine NEGATIVE  NEGATIVE   Ketones, ur NEGATIVE  NEGATIVE mg/dL   Protein, ur NEGATIVE  NEGATIVE mg/dL   Urobilinogen, UA 0.2  0.0 - 1.0 mg/dL   Nitrite NEGATIVE  NEGATIVE   Leukocytes, UA NEGATIVE  NEGATIVE   Comment: MICROSCOPIC NOT DONE ON URINES WITH NEGATIVE PROTEIN, BLOOD, LEUKOCYTES, NITRITE, OR GLUCOSE <1000 mg/dL.    Radiological Exams on Admission: Dg Chest 2 View  11/04/2012   *RADIOLOGY REPORT*  Clinical Data: Shortness of breath, cough  CHEST - 2 VIEW  Comparison: 08/15/2012  Findings: Lungs mildly hyperinflated with somewhat coarse interstitial markings.  Heart size normal.  Tortuous thoracic aorta.  No effusion.  IMPRESSION:  1.  No acute disease   Original Report Authenticated By: D. Andria Rhein, MD    Assessment/Plan Active Problems:   HYPOTHYROIDISM   Morbid obesity   Coronary artery disease   Anxiety   MDD (major depressive disorder), recurrent severe, without psychosis   COPD exacerbation   OSA on CPAP  COPD exacerbation Patient has bilateral wheezing, will start on DuoNeb nebulizers every 6 hours along with albuterol every 2 hours when necessary nebulizers. Patient has been given prednisone 60 mg in the ED, will  start prednisone 50 mg from tomorrow which can be tapered over next few days. I do not think patient would benefit from antibiotics at this time.  History of CAD Patient had MI in 1997, I will obtain an echocardiogram to rule out CHF. We'll also obtain BNP Patient is currently on Lasix 30 mg by mouth daily which will be continued  Hypothyroidism Continue Synthroid  Obstructive sleep apnea Patient uses CPAP at bedtime  Depression Will continue with Cymbalta, amitriptyline, valproate  Insomnia Continue trazodone  DVTProphylaxis  Lovenox  Code status: DO NOT RESUSCITATE, discussed with patient in detail regarding CPR, intubation and mechanical ventilation. Patient wants to be DO NOT RESUSCITATE, which was  confirmed with patient's sister at bedside  Family discussion: Discussed with patient's sister at bedside   Time Spent on Admission: 75 min  [facility] Pager: [phone] 11/04/2012, 8:57 PM

## 2012-11-04 NOTE — ED Notes (Signed)
Pt c/o SOB and cough x1 week. Pt states symptoms have progressively worsened even with the use of her home inhaler. Pt states "my chest hurts when I cough". Cough in unproductive.

## 2012-11-04 NOTE — Patient Instructions (Signed)
Discussed orally 

## 2012-11-04 NOTE — ED Notes (Signed)
Pt c/o SOB and nonproductive cough since last Wednesday.   Also c/o mouth staying dry.

## 2012-11-04 NOTE — ED Notes (Signed)
Pt ambulated in hall with cane and assistance. Sat dropped to 83 % pt became SOB and wheezing increased

## 2012-11-04 NOTE — ED Provider Notes (Signed)
History     CSN: [id number]  Arrival date & time 11/04/12  1552   First MD Initiated Contact with Patient 11/04/12 1605      Chief Complaint  Patient presents with  . Shortness of Breath     HPI Pt was seen at 1615.   Per pt, c/o gradual onset and worsening of persistent non-productive cough, wheezing and SOB for the past 1 week.  Describes her symptoms as "my asthma is acting up."  Symptoms worsen when she ambulates. Pt states her symptoms began when she was admitted at [facility] last week, and was being given nebs with transient relief. States since she was discharged 4 days ago, she has been using her home MDI without relief. Has been associated with generalized weakness/fatigue. Denies CP/palpitations, no back pain, no abd pain, no N/V/D, no fevers, no rash, no focal motor weakness, no tingling/numbness in extremities.     Past Medical History  Diagnosis Date  . Hyperlipidemia   . CAD (coronary artery disease)   . Bipolar 1 disorder   . GERD (gastroesophageal reflux disease)   . Hyperthyroidism   . Chronic back pain   . Dyspnea     PFT 03/05/09 FEV1 2.77(98%), FVC 3.25(86%), FEV1% 85, TLC 5.88(99%), DLCO 60% ,  Methacholine challenge 03/16/09 normal ,  CT chest 03/12/09 no pulmonary disease  . Anxiety   . Arthritis   . Depression   . OSA on CPAP     2 liters  . HTN (hypertension)   . History of colonoscopy 10/17/2002    by Dr Rehman-> distal non-specific proctitis, small ext hemorrhoids,   . Fungal infection   . Cellulitis   . Contusion of sacrum   . Migraine headache   . Vitamin D deficiency   . Sleep apnea   . Myocardial infarction     NOV 1997  . Hypothyroidism     States she only has hyperthyroidism  . Complication of anesthesia     States she typically gets sick s/p anesthesia  . Morbid obesity with body mass index of 50.0-59.9 in adult JAN 2011 370 LBS    2004 311 BMI 45.9  . Obsessive-compulsive disorder   . PTSD (post-traumatic stress disorder)   . Asthma   .  Allergy   . Urine incontinence   . Chronic headaches   . Suicidal ideation   . COPD (chronic obstructive pulmonary disease)   . Schizoaffective disorder, bipolar type     Past Surgical History  Procedure Laterality Date  . Cholecystectomy    . Knee arthroscopy    . Appendectomy    . Tonsillectomy    . Back surgery  2008  . Total vaginal hysterectomy    . Abdominal hysterectomy      sept 1996  . Tubal ligation    . Colonoscopy  10/17/2002     Distal proctitis, small external hemorrhoids, otherwise/  normal colonoscopy. Suspect rectal bleeding secondary to hemorrhoids  . Esophagogastroduodenoscopy  03/18/09    fundic gland polyps/mild gastritis  . Cardiac catheterization      nov 1997  . Joint replacement      bil knee replacement  . Hernia repair  1978    Family History  Problem Relation Age of Onset  . Cancer Mother   . Hypertension Mother   . Bipolar disorder Mother   . Dementia Mother   . Depression Mother   . Coronary artery disease Father   . Alcohol abuse Father   . Hypertension  Brother   . Coronary artery disease Brother   . Bipolar disorder Brother   . Depression Brother   . Anesthesia problems Neg Hx   . Hypotension Neg Hx   . Malignant hyperthermia Neg Hx   . Pseudochol deficiency Neg Hx   . Depression Sister   . Paranoid behavior Sister   . Bipolar disorder Sister   . Depression Sister   . Hypertension Sister   . Cancer Son     thyroid    History  Substance Use Topics  . Smoking status: Passive Smoke Exposure - Never Smoker  . Smokeless tobacco: Never Used  . Alcohol Use: No      Review of Systems ROS: Statement: All systems negative except as marked or noted in the HPI; Constitutional: Negative for fever and chills. +generalized weakness/fatigue.; ; Eyes: Negative for eye pain, redness and discharge. ; ; ENMT: Negative for ear pain, hoarseness, nasal congestion, sinus pressure and sore throat. ; ; Cardiovascular: Negative for chest pain,  palpitations, diaphoresis, and peripheral edema. ; ; Respiratory: +cough, wheezing, SOB. Negative for stridor. ; ; Gastrointestinal: Negative for nausea, vomiting, diarrhea, abdominal pain, blood in stool, hematemesis, jaundice and rectal bleeding. . ; ; Genitourinary: Negative for dysuria, flank pain and hematuria. ; ; Musculoskeletal: Negative for back pain and neck pain. Negative for swelling and trauma.; ; Skin: Negative for pruritus, rash, abrasions, blisters, bruising and skin lesion.; ; Neuro: Negative for headache, lightheadedness and neck stiffness. Negative for weakness, altered level of consciousness , altered mental status, extremity weakness, paresthesias, involuntary movement, seizure and syncope.       Allergies  Haldol; Ativan; and Naproxen  Home Medications   Current Outpatient Rx  Name  Route  Sig  Dispense  Refill  . albuterol (PROVENTIL HFA;VENTOLIN HFA) 108 (90 BASE) MCG/ACT inhaler   Inhalation   Inhale 2 puffs into the lungs every 6 (six) hours as needed for wheezing or shortness of breath.         Marland Kitchen amitriptyline (ELAVIL) 50 MG tablet   Oral   Take 1 tablet (50 mg total) by mouth at bedtime.         Marland Kitchen aspirin 81 MG chewable tablet   Oral   Chew 1 tablet (81 mg total) by mouth daily. For blood thinner.   30 tablet   0   . atorvastatin (LIPITOR) 20 MG tablet   Oral   Take 20 mg by mouth at bedtime.         Marland Kitchen buPROPion (WELLBUTRIN SR) 200 MG 12 hr tablet   Oral   Take 200 mg by mouth at bedtime. For depression         . cetirizine (ZYRTEC) 10 MG tablet   Oral   Take 10 mg by mouth every morning.         . Cholecalciferol 2000 UNITS TABS   Oral   Take 1 tablet (2,000 Units total) by mouth daily with breakfast. Nutritional supplement.   30 each      . divalproex (DEPAKOTE ER) 500 MG 24 hr tablet   Oral   Take 3 tablets (1,500 mg total) by mouth at bedtime. On occasions may take one during the day for extreme stress.         . donepezil  (ARICEPT) 10 MG tablet   Oral   Take 1 tablet (10 mg total) by mouth daily with breakfast.   30 tablet   2   . DULoxetine (CYMBALTA) 60 MG capsule  Oral   Take 1 capsule (60 mg total) by mouth 2 (two) times daily.   60 capsule   3   . fluticasone (FLONASE) 50 MCG/ACT nasal spray   Nasal   Place 1 spray into the nose 2 (two) times daily.   16 g   6   . furosemide (LASIX) 20 MG tablet   Oral   Take 1.5 tablets (30 mg total) by mouth daily. For blood pressure control.   45 tablet   0   . gabapentin (NEURONTIN) 100 MG capsule   Oral   Take 2 capsules (200 mg total) by mouth 3 (three) times daily. For pain and mood control   90 capsule   0   . levothyroxine (SYNTHROID, LEVOTHROID) 125 MCG tablet   Oral   Take 1 tablet (125 mcg total) by mouth daily before breakfast. For hypothyroidism.         . Magnesium 200 MG TABS   Oral   Take 1 tablet by mouth daily.         . montelukast (SINGULAIR) 10 MG tablet   Oral   Take 1 tablet (10 mg total) by mouth at bedtime.         . Multiple Vitamin (MULTIVITAMIN WITH MINERALS) TABS   Oral   Take 1 tablet by mouth daily. For nutritional supplementation.   30 tablet   0   . niacin (NIASPAN) 500 MG CR tablet   Oral   Take 500 mg by mouth at bedtime.         Marland Kitchen nystatin (MYCOSTATIN/NYSTOP) 100000 UNIT/GM POWD   Topical   Apply 1 g topically 3 (three) times daily as needed (redness and moisture).      0   . omeprazole (PRILOSEC) 20 MG capsule   Oral   Take 1 capsule (20 mg total) by mouth daily.   30 capsule   6   . polyethylene glycol (MIRALAX / GLYCOLAX) packet   Oral   Take 17 g by mouth daily.   14 each   0   . traZODone (DESYREL) 100 MG tablet   Oral   Take 1 tablet (100 mg total) by mouth at bedtime as needed for sleep or depression.   30 tablet   0   . verapamil (CALAN-SR) 180 MG CR tablet   Oral   Take 180 mg by mouth every morning.         . sennosides-docusate sodium (SENOKOT-S) 8.6-50 MG  tablet   Oral   Take 1 tablet by mouth daily.           BP 125/78  Pulse 90  Temp(Src) 98 F (36.7 C)  Resp 26  Ht 5' 8.5" (1.74 m)  Wt 370 lb (167.831 kg)  BMI 55.43 kg/m2  SpO2 98%  LMP 02/18/1995  Physical Exam 1620: Physical examination:  Nursing notes reviewed; Vital signs and O2 SAT reviewed;  Constitutional: Well developed, Well nourished, Well hydrated, Uncomfortable appearing; Head:  Normocephalic, atraumatic; Eyes: EOMI, PERRL, No scleral icterus; ENMT: Mouth and pharynx normal, Mucous membranes moist; Neck: Supple, Full range of motion, No lymphadenopathy; Cardiovascular: Regular rate and rhythm, No gallop; Respiratory: Breath sounds coarse & equal bilaterally, scattered insp/exp wheezes bilat, with occasional audible wheezing. Tachypneic. Speaking in phrases. No retrax or access mm use.; Chest: Nontender, Movement normal; Abdomen: Soft, Nontender, Nondistended, Normal bowel sounds; Genitourinary: No CVA tenderness; Extremities: Pulses normal, No tenderness, No edema, No calf edema or asymmetry.; Neuro: AA&Ox3, Major CN grossly  intact.  Speech clear. No gross focal motor or sensory deficits in extremities.; Skin: Color normal, Warm, Dry.; Psych:  Anxious.   ED Course  Procedures   1625:  Sitting upright, tachypneic, wheezing, uncomfortable appearing.  Steroids and continuous neb started.  1825:  Hour long neb completed. Lungs continue coarse with insp/exp wheezing bilat, Sats 99% R/A.  Pt ambulated with c/o increasing SOB, wheezing, and tachypnea, with Sats dropping to 83% R/A.  Pt escorted back to stretcher with Sats again increasing to 98% R/A at rest. Lungs continue coarse with insp/exp wheezing.  Will admit. Dx and testing d/w pt and family.  Questions answered.  Verb understanding, agreeable to admit.  1845: T/C to [facility]. Kerry Hough, case discussed, including:  HPI, pertinent PM/SHx, VS/PE, dx testing, ED course and treatment:  Agreeable to observation admit, requests to  write temporary orders, obtain medical bed to team 2.   MDM  MDM Reviewed: previous chart, nursing note and vitals Reviewed previous: labs and ECG Interpretation: labs, ECG and x-ray Total time providing critical care: 30-74 minutes. This excludes time spent performing separately reportable procedures and services. Consults: admitting MD   CRITICAL CARE Performed by: Laray Anger Total critical care time: 35 Critical care time was exclusive of separately billable procedures and treating other patients. Critical care was necessary to treat or prevent imminent or life-threatening deterioration. Critical care was time spent personally by me on the following activities: development of treatment plan with patient and/or surrogate as well as nursing, discussions with consultants, evaluation of patient's response to treatment, examination of patient, obtaining history from patient or surrogate, ordering and performing treatments and interventions, ordering and review of laboratory studies, ordering and review of radiographic studies, pulse oximetry and re-evaluation of patient's condition.    Date: 11/04/2012  Rate: 76  Rhythm: normal sinus rhythm  QRS Axis: normal  Intervals: normal  ST/T Wave abnormalities: normal  Conduction Disutrbances:none  Narrative Interpretation:   Old EKG Reviewed: unchanged; no significant changes from previous EKG dated 01/16/2012.  Results for orders placed during the hospital encounter of 11/04/12  BASIC METABOLIC PANEL      Result Value Range   Sodium 139  135 - 145 mEq/L   Potassium 3.9  3.5 - 5.1 mEq/L   Chloride 101  96 - 112 mEq/L   CO2 29  19 - 32 mEq/L   Glucose, Bld 100 (*) 70 - 99 mg/dL   BUN 14  6 - 23 mg/dL   Creatinine, Ser 1.61  0.50 - 1.10 mg/dL   Calcium 9.1  8.4 - 09.6 mg/dL   GFR calc non Af Amer 79 (*) >90 mL/min   GFR calc Af Amer >90  >90 mL/min  CBC WITH DIFFERENTIAL      Result Value Range   WBC 9.0  4.0 - 10.5 K/uL    RBC 4.17  3.87 - 5.11 MIL/uL   Hemoglobin 13.0  12.0 - 15.0 g/dL   HCT 04.5  40.9 - 81.1 %   MCV 91.8  78.0 - 100.0 fL   MCH 31.2  26.0 - 34.0 pg   MCHC 33.9  30.0 - 36.0 g/dL   RDW 91.4  78.2 - 95.6 %   Platelets 158  150 - 400 K/uL   Neutrophils Relative % 52  43 - 77 %   Neutro Abs 4.6  1.7 - 7.7 K/uL   Lymphocytes Relative 35  12 - 46 %   Lymphs Abs 3.2  0.7 - 4.0 K/uL  Monocytes Relative 7  3 - 12 %   Monocytes Absolute 0.6  0.1 - 1.0 K/uL   Eosinophils Relative 6 (*) 0 - 5 %   Eosinophils Absolute 0.5  0.0 - 0.7 K/uL   Basophils Relative 1  0 - 1 %   Basophils Absolute 0.1  0.0 - 0.1 K/uL  TROPONIN I      Result Value Range   Troponin I <0.30  <0.30 ng/mL                   Laray Anger, DO 11/07/12 0106

## 2012-11-04 NOTE — Progress Notes (Signed)
Patient:  Allison Sharp   DOB: May 18, 1956  MR Number: [id number]  Location: [facility]:  [address] [place],  [place], [address]  Start: Monday 11/04/2012 3:00 PM End: Monday 11/04/2012 3:30 PM  Provider/Observer:     Florencia Reasons, MSW, LCSW   Chief Complaint:      Chief Complaint  Patient presents with  . Depression  . Anxiety    Reason For Service:     The patient has a long-standing history of mood disturbances and several psychiatric hospitalizations. She is resuming services with this clinician after 8 month absence. She has been referred for followup treatment upon her discharge on 10/31/2012  from hospitalization at the [facility] where she was treated for suicidal and homicidal ideations.    Interventions Strategy:  Supportive therapy  Participation Level:   Active  Participation Quality:  Appropriate      Behavioral Observation:  Casual, Alert, and Appropriate.   Current Psychosocial Factors:   Content of Session:    reviewing symptoms, processing feelings, identifying ways to be assertive , reinforcing patient's efforts to improve self-care  Current Status:   The patient reports improved mood and decreased anxiety. She rates anxiety and depression at 1 on a 10 point scale with one being none and  10 being severe. She denies suicidal ideations, homicidal ideations, and hallucinations. Patient agrees to call this practice, call 911, or have someone take her to the emergency room should symptoms worsen.  Patient Progress:   Good. The patient reports being  hospitalized in June 2013 due to experiencing suicidal and homicidal ideations triggered by anger and frustration regarding arguing between her sister and her niece. She has been residing with them since November 2013 and reports she had been internalizing her feelings regarding their behavior. She reports being upset  by the effects on her as well as the effects on her 36-year-old nephew. She is  pleased that her sister and niece had a positive response when she expressed her concerns while she was hospitalized. Since her return home, patient reports things have been going very well and that sister and niece no longer argue in patient's presence. 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 Therapist works with patient to reinforce use of assertiveness skills and efforts to improve self-care.    Target Goals:   Improve mood, decrease anxiety  Last Reviewed:     Goals Addressed Today:    Improve mood, decrease anxiety  Impression/Diagnosis:   The patient presents with a long-standing history of mood disturbances with 3 suicide attempts and several psychiatric hospitalizations. The most recent hospitalization occurred in August 2013 due to to depression and hallucinations . Patient has a history of bipolar disorder and a significant family history of bipolar disorder. Her symptoms have included guilt, depressed mood, anxiety, mood swings,  and excessive worrying. Diagnosis: Bipolar 1 disorder   Diagnosis:  Axis I:  Bipolar 1 disorder          Axis II: Deferred

## 2012-11-04 NOTE — Progress Notes (Signed)
Patient Discharge Instructions:  Next Level Care Provider Has Access to the EMR, 11/04/12 Records provided to [facility] via [facility]/Epic Access  Jerelene Redden, 11/04/2012, 3:21 PM

## 2012-11-05 DIAGNOSIS — J45901 Unspecified asthma with (acute) exacerbation: Secondary | ICD-10-CM

## 2012-11-05 DIAGNOSIS — I517 Cardiomegaly: Secondary | ICD-10-CM

## 2012-11-05 DIAGNOSIS — J9600 Acute respiratory failure, unspecified whether with hypoxia or hypercapnia: Secondary | ICD-10-CM | POA: Diagnosis present

## 2012-11-05 LAB — COMPREHENSIVE METABOLIC PANEL
BUN: 13 mg/dL (ref 6–23)
Calcium: 9.3 mg/dL (ref 8.4–10.5)
GFR calc Af Amer: >90 mL/min (ref >90)
Glucose, Bld: 114 mg/dL — ABNORMAL HIGH (ref 70–99)
Sodium: 141 mEq/L (ref 135–145)
Total Protein: 6.9 g/dL (ref 6.0–8.3)

## 2012-11-05 LAB — CBC
HCT: 38.6 % (ref 36.0–46.0)
Hemoglobin: 12.7 g/dL (ref 12.0–15.0)
MCH: 30.2 pg (ref 26.0–34.0)
MCHC: 32.9 g/dL (ref 30.0–36.0)
RDW: 13 % (ref 11.5–15.5)

## 2012-11-05 LAB — PRO B NATRIURETIC PEPTIDE: Pro B Natriuretic peptide (BNP): 64.9 pg/mL (ref 0–125)

## 2012-11-05 MED ORDER — LEVOFLOXACIN 750 MG PO TABS
750.0000 mg | ORAL_TABLET | Freq: Every day | ORAL | Status: DC
  Administered 2012-11-05 – 2012-11-07 (×3): 750 mg via ORAL
  Filled 2012-11-05 (×3): qty 1

## 2012-11-05 MED ORDER — METHYLPREDNISOLONE SODIUM SUCC 125 MG IJ SOLR
60.0000 mg | Freq: Four times a day (QID) | INTRAMUSCULAR | Status: DC
  Administered 2012-11-05 – 2012-11-07 (×8): 60 mg via INTRAVENOUS
  Filled 2012-11-05 (×8): qty 2

## 2012-11-05 MED ORDER — ENOXAPARIN SODIUM 60 MG/0.6ML ~~LOC~~ SOLN
60.0000 mg | SUBCUTANEOUS | Status: DC
  Administered 2012-11-05: 60 mg via SUBCUTANEOUS
  Filled 2012-11-05: qty 0.6

## 2012-11-05 MED ORDER — BIOTENE DRY MOUTH MT LIQD
15.0000 mL | Freq: Two times a day (BID) | OROMUCOSAL | Status: DC
  Administered 2012-11-05 – 2012-11-07 (×5): 15 mL via OROMUCOSAL

## 2012-11-05 NOTE — Progress Notes (Signed)
UR chart review completed.  

## 2012-11-05 NOTE — Progress Notes (Signed)
Pt setup on CPAP of 11 with 2lpm bleed in per setting at home. Pt tolerating well will continue to monitor throughout the night

## 2012-11-05 NOTE — Progress Notes (Signed)
[facility] PROGRESS NOTE  Allison Sharp AVW:[id number] DOB: 02-04-1956 DOA: 11/04/2012 PCP: Maximino Sarin Cox, FNP  Assessment/Plan: Asthma exacerbation: Patient continues with bilateral wheezing and decreased air flow. Continue  DuoNeb nebulizers every 6 hours along with albuterol every 2 hours when necessary nebulizers. Patient  given prednisone 60 mg in the ED, She will be started on solumedrol. Will start Levaquin as well.    History of CAD: Patient had MI in 1997, await echocardiogram to rule out CHF. BNP pending. Patient is currently on Lasix 30 mg by mouth daily which will be continued. Intake and output. Continue ASA.  HTN. Fair control.    Hypothyroidism: Continue Synthroid   Obstructive sleep apnea: Patient uses CPAP at bedtime. Tolerated well last night.    Depression: stable at baseline. Continue with Cymbalta, amitriptyline, valproate   Insomnia:Continue trazodone. Stable   DVT Prophylaxis:Lovenox   Morbid obesity. BMI 55.6. Will request nutritional consult.    Code Status: DNR Family Communication: none available Disposition Plan: home when ready hopefully tomorrow   Consultants:  none  Procedures:  none  Antibiotics:  Levaquin 11/05/12>>  HPI/Subjective: Sitting up in bed. Reports breathing better but not at baseline  Objective: Filed Vitals:   11/04/12 2311 11/05/12 0250 11/05/12 0607 11/05/12 0655  BP:  118/77 120/88   Pulse:  84 82   Temp:   97.3 F (36.3 C)   TempSrc:   Oral   Resp:  20 20   Height:      Weight:   171.8 kg (378 lb 12 oz)   SpO2: 96% 93% 96% 96%    Intake/Output Summary (Last 24 hours) at 11/05/12 0909 Last data filed at 11/05/12 0841  Gross per 24 hour  Intake    880 ml  Output    750 ml  Net    130 ml   Filed Weights   11/04/12 1558 11/05/12 0607  Weight: 167.831 kg (370 lb) 171.8 kg (378 lb 12 oz)    Exam:   General:  Morbidly obese, NAD  Cardiovascular: RRR No MGR 1+ LE edema  Respiratory: mild  increased work of breathing with conversation. BS with diffuse rhonchi and expiratory wheeze   Abdomen: soft +BS non-tender to palpation  Musculoskeletal: no clubbing no cyanosis   Data Reviewed: Basic Metabolic Panel:  Recent Labs Lab 11/04/12 1634 11/05/12 0442  NA 139 141  K 3.9 4.2  CL 101 104  CO2 29 28  GLUCOSE 100* 114*  BUN 14 13  CREATININE 0.81 0.66  CALCIUM 9.1 9.3   Liver Function Tests:  Recent Labs Lab 11/05/12 0442  AST 13  ALT 8  ALKPHOS 82  BILITOT 0.3  PROT 6.9  ALBUMIN 3.4*   No results found for this basename: LIPASE, AMYLASE,  in the last 168 hours No results found for this basename: AMMONIA,  in the last 168 hours CBC:  Recent Labs Lab 11/04/12 1634 11/05/12 0442  WBC 9.0 9.0  NEUTROABS 4.6  --   HGB 13.0 12.7  HCT 38.3 38.6  MCV 91.8 91.7  PLT 158 154   Cardiac Enzymes:  Recent Labs Lab 11/04/12 1634  TROPONINI <0.30   BNP (last 3 results) No results found for this basename: PROBNP,  in the last 8760 hours CBG: No results found for this basename: GLUCAP,  in the last 168 hours  No results found for this or any previous visit (from the past 240 hour(s)).   Studies: Dg Chest 2 View  11/04/2012   *  RADIOLOGY REPORT*  Clinical Data: Shortness of breath, cough  CHEST - 2 VIEW  Comparison: 08/15/2012  Findings: Lungs mildly hyperinflated with somewhat coarse interstitial markings.  Heart size normal.  Tortuous thoracic aorta.  No effusion.  IMPRESSION:  1.  No acute disease   Original Report Authenticated By: D. Andria Rhein, MD    Scheduled Meds: . sodium chloride   Intravenous STAT  . albuterol  2.5 mg Nebulization Q6H  . amitriptyline  50 mg Oral QHS  . antiseptic oral rinse  15 mL Mouth Rinse BID  . aspirin  81 mg Oral Daily  . atorvastatin  20 mg Oral QHS  . buPROPion  200 mg Oral QHS  . divalproex  1,500 mg Oral QHS  . donepezil  10 mg Oral Q breakfast  . DULoxetine  60 mg Oral BID  . enoxaparin (LOVENOX) injection  80  mg Subcutaneous Q24H  . fluticasone  1 spray Each Nare BID  . furosemide  30 mg Oral Daily  . gabapentin  200 mg Oral TID  . ipratropium  0.5 mg Nebulization Q6H  . levofloxacin  750 mg Oral Daily  . levothyroxine  125 mcg Oral QAC breakfast  . loratadine  10 mg Oral Daily  . montelukast  10 mg Oral QHS  . niacin  500 mg Oral QHS  . polyethylene glycol  17 g Oral Daily  . predniSONE  50 mg Oral Q breakfast  . senna-docusate  1 tablet Oral Daily  . sodium chloride  3 mL Intravenous Q12H  . verapamil  180 mg Oral q morning - 10a   Continuous Infusions:   Active Problems:   HYPOTHYROIDISM   Morbid obesity   Coronary artery disease   Anxiety   MDD (major depressive disorder), recurrent severe, without psychosis   COPD exacerbation   OSA on CPAP    Time spent: 30 minutes    [facility]  [facility] Pager [phone]. If 7PM-7AM, please contact night-coverage at [email or web address], password [facility] 11/05/2012, 9:09 AM  LOS: 1 day   Attending note:  Patient seen and examined.  Above note reviewed and agree.  She continues to wheeze and is short of breath and will need IV steroids.  Continue neb treatments. BNP normal.  MEMON,JEHANZEB

## 2012-11-05 NOTE — Care Management Note (Signed)
    Page 1 of 2   11/07/2012     9:56:03 AM   CARE MANAGEMENT NOTE 11/07/2012  Patient:  Allison Sharp, Allison Sharp   Account Number:  [id number]  Date Initiated:  11/05/2012  Documentation initiated by:  Sharrie Rothman  Subjective/Objective Assessment:   Pt admitted from home with COPD exacerbation. Pt lives with her sister who helps care for her. Pt has O2 and cpap, and cane for home use. Pt would like a rolling walker for home use.     Action/Plan:   Will continue to follow for [facility] needs. Pt would like [place] for rolling walker.   Anticipated DC Date:  11/07/2012   Anticipated DC Plan:  HOME/SELF CARE      DC Planning Services  CM consult      PAC Choice  DURABLE MEDICAL EQUIPMENT   Choice offered to / List presented to:  C-1 Patient   DME arranged  WALKER - ROLLING      DME agency  [facility]     HH arranged  HH-2 PT      HH agency  [facility]   Status of service:  Completed, signed off Medicare Important Message given?  YES (If response is "NO", the following Medicare IM given date fields will be blank) Date Medicare IM given:  11/07/2012 Date Additional Medicare IM given:    Discharge Disposition:  HOME W HOME HEALTH SERVICES  Per UR Regulation:    If discussed at Long Length of Stay Meetings, dates discussed:    Comments:  11/07/12 0955 Arlyss Queen, RN BSN CM Pt discharged home today with [facility] PT. Alroy Bailiff of [facility] is aware and will collect the pts information from the chart. Rolling walker ordered from [facility] per pts request. Dan Humphreys to be delivered to pts room prior to discharge. Hh services to start within 48 hours. Pt and pts nurse aware of discharge arrangements.  11/05/12 1335 Arlyss Queen, RN BSN CM

## 2012-11-05 NOTE — Progress Notes (Signed)
*  PRELIMINARY RESULTS* Echocardiogram 2D Echocardiogram has been performed.  Conrad Freeport 11/05/2012, 10:51 AM

## 2012-11-06 LAB — CBC
Hemoglobin: 13.1 g/dL (ref 12.0–15.0)
RBC: 4.31 MIL/uL (ref 3.87–5.11)
WBC: 10.4 10*3/uL (ref 4.0–10.5)

## 2012-11-06 LAB — BASIC METABOLIC PANEL
CO2: 29 mEq/L (ref 19–32)
Chloride: 102 mEq/L (ref 96–112)
GFR calc non Af Amer: >90 mL/min (ref >90)
Glucose, Bld: 155 mg/dL — ABNORMAL HIGH (ref 70–99)
Potassium: 4.1 mEq/L (ref 3.5–5.1)
Sodium: 141 mEq/L (ref 135–145)

## 2012-11-06 LAB — URINE CULTURE

## 2012-11-06 MED ORDER — GUAIFENESIN ER 600 MG PO TB12
600.0000 mg | ORAL_TABLET | Freq: Two times a day (BID) | ORAL | Status: DC | PRN
  Administered 2012-11-06: 600 mg via ORAL
  Filled 2012-11-06: qty 1

## 2012-11-06 MED ORDER — FUROSEMIDE 10 MG/ML IJ SOLN
40.0000 mg | Freq: Once | INTRAMUSCULAR | Status: AC
  Administered 2012-11-06: 40 mg via INTRAVENOUS
  Filled 2012-11-06: qty 4

## 2012-11-06 MED ORDER — ENOXAPARIN SODIUM 80 MG/0.8ML ~~LOC~~ SOLN
80.0000 mg | SUBCUTANEOUS | Status: DC
  Administered 2012-11-06: 80 mg via SUBCUTANEOUS
  Filled 2012-11-06: qty 0.8

## 2012-11-06 NOTE — Progress Notes (Signed)
[facility] PROGRESS NOTE  Allison Sharp ZOX:[id number] DOB: [birth year] DOA: 11/04/2012 PCP: Maximino Sarin Cox, FNP  Assessment/Plan: Asthma exacerbation: Much improved this am. Better air flow, less wheeze.  Continue DuoNeb nebulizers every 6 hours along with albuterol every 2 hours when necessary nebulizers. Will start taper. Levaquin day #2.   History of CAD: Patient had MI in 1997, echocardiogram with mild LVH and grade 1 diastolic dysfunction. BNP 64. Patient is currently on Lasix 30 mg by mouth daily which will be continued. Will also give one dose IV lasix.Volume status -265.  Continue ASA.   HTN. Fair control. Monitor  Hypothyroidism: Continue Synthroid   Obstructive sleep apnea: Patient uses CPAP at bedtime. Tolerating well.   Depression: stable at baseline. Continue with Cymbalta, amitriptyline, valproate  Insomnia:Continue trazodone. Stable   DVT Prophylaxis:Lovenox   Morbid obesity. BMI 55.6. Will request nutritional consult.   Code Status: DNR Family Communication: none available Disposition Plan: home likely tomorrow   Consultants:  none  Procedures:  none  Antibiotics:  Levaquin 11/05/12>>  HPI/Subjective: Awake reports feeling better but weak  Objective: Filed Vitals:   11/05/12 2221 11/06/12 0538 11/06/12 0827 11/06/12 0934  BP: 135/86 161/94  127/83  Pulse: 90 91  90  Temp: 97.5 F (36.4 C) 98.2 F (36.8 C)    TempSrc:      Resp: 22 20    Height:      Weight:      SpO2: 92% 98% 98% 95%    Intake/Output Summary (Last 24 hours) at 11/06/12 1117 Last data filed at 11/06/12 0836  Gross per 24 hour  Intake    880 ml  Output    955 ml  Net    -75 ml   Filed Weights   11/04/12 1558 11/05/12 0607  Weight: 167.831 kg (370 lb) 171.8 kg (378 lb 12 oz)    Exam:   General:  Morbidly obese NAD  Cardiovascular: RRR No MGR trace LE edema  Respiratory: normal effort improved air movement. Only occasional faint wheeze, no rhonchi fine  crackles left base.   Abdomen: obese soft +BS non-tender to palpation  Musculoskeletal: no clubbing no cyanosis   Data Reviewed: Basic Metabolic Panel:  Recent Labs Lab 11/04/12 1634 11/05/12 0442 11/06/12 0435  NA 139 141 141  K 3.9 4.2 4.1  CL 101 104 102  CO2 29 28 29   GLUCOSE 100* 114* 155*  BUN 14 13 14   CREATININE 0.81 0.66 0.74  CALCIUM 9.1 9.3 9.7   Liver Function Tests:  Recent Labs Lab 11/05/12 0442  AST 13  ALT 8  ALKPHOS 82  BILITOT 0.3  PROT 6.9  ALBUMIN 3.4*   No results found for this basename: LIPASE, AMYLASE,  in the last 168 hours No results found for this basename: AMMONIA,  in the last 168 hours CBC:  Recent Labs Lab 11/04/12 1634 11/05/12 0442 11/06/12 0435  WBC 9.0 9.0 10.4  NEUTROABS 4.6  --   --   HGB 13.0 12.7 13.1  HCT 38.3 38.6 39.7  MCV 91.8 91.7 92.1  PLT 158 154 167   Cardiac Enzymes:  Recent Labs Lab 11/04/12 1634  TROPONINI <0.30   BNP (last 3 results)  Recent Labs  11/05/12 0442  PROBNP 64.9   CBG: No results found for this basename: GLUCAP,  in the last 168 hours  Recent Results (from the past 240 hour(s))  URINE CULTURE     Status: None   Collection Time  11/04/12  5:35 PM      Result Value Range Status   Specimen Description URINE, CLEAN CATCH   Final   Special Requests NONE   Final   Culture  Setup Time 11/05/2012 02:46   Final   Colony Count NO GROWTH   Final   Culture NO GROWTH   Final   Report Status 11/06/2012 FINAL   Final     Studies: Dg Chest 2 View  11/04/2012   *RADIOLOGY REPORT*  Clinical Data: Shortness of breath, cough  CHEST - 2 VIEW  Comparison: 08/15/2012  Findings: Lungs mildly hyperinflated with somewhat coarse interstitial markings.  Heart size normal.  Tortuous thoracic aorta.  No effusion.  IMPRESSION:  1.  No acute disease   Original Report Authenticated By: D. Deanne Coffer III, MD    Scheduled Meds: . albuterol  2.5 mg Nebulization Q6H  . amitriptyline  50 mg Oral QHS  .  antiseptic oral rinse  15 mL Mouth Rinse BID  . aspirin  81 mg Oral Daily  . atorvastatin  20 mg Oral QHS  . buPROPion  200 mg Oral QHS  . divalproex  1,500 mg Oral QHS  . donepezil  10 mg Oral Q breakfast  . DULoxetine  60 mg Oral BID  . enoxaparin (LOVENOX) injection  80 mg Subcutaneous Q24H  . fluticasone  1 spray Each Nare BID  . furosemide  40 mg Intravenous Once  . furosemide  30 mg Oral Daily  . gabapentin  200 mg Oral TID  . ipratropium  0.5 mg Nebulization Q6H  . levofloxacin  750 mg Oral Daily  . levothyroxine  125 mcg Oral QAC breakfast  . loratadine  10 mg Oral Daily  . methylPREDNISolone (SOLU-MEDROL) injection  60 mg Intravenous Q6H  . montelukast  10 mg Oral QHS  . niacin  500 mg Oral QHS  . polyethylene glycol  17 g Oral Daily  . senna-docusate  1 tablet Oral Daily  . sodium chloride  3 mL Intravenous Q12H  . verapamil  180 mg Oral q morning - 10a   Continuous Infusions:   Active Problems:   HYPOTHYROIDISM   Morbid obesity   Coronary artery disease   Anxiety   MDD (major depressive disorder), recurrent severe, without psychosis   Asthma with acute exacerbation   OSA on CPAP   Acute respiratory failure    Time spent: 30 minutes [facility] M  [facility] Pager [phone]. If 7PM-7AM, please contact night-coverage at [email or web address], password [email or web address] 11/06/2012, 11:17 AM  LOS: 2 days   Attending note:  Patient seen and examined.  Above note reviewed.  Patient's wheezing has appeared to improved with IV steroids. I think we need to continue current treatments for one more day. Continue nebulizer treatments and steroids. She does have some crackles at her bases and has some diastolic dysfunction on echo. We will give 1 dose of IV Lasix and monitor for response. Anticipate that she may be able to discharge tomorrow.  Jacoby Zanni

## 2012-11-06 NOTE — Plan of Care (Signed)
Problem: Phase I Progression Outcomes Goal: Progress activity as tolerated unless otherwise ordered Outcome: Completed/Met Date Met:  11/06/12 Pt up to chair today and ambulated in hallway with PT using walker.  Tolerated well.

## 2012-11-06 NOTE — Evaluation (Signed)
Physical Therapy Evaluation Patient Details Name: Allison Sharp MRN: [id number] DOB: [birth year] Today's Date: 11/06/2012 Time: 4098-1191 PT Time Calculation (min): 26 min  PT Assessment / Plan / Recommendation Clinical Impression  Pt was seen for evaluation. She is a very pleasant, morbidly obese pt with exacerbation of COPD.  She lives with her sister but is alone during the day.  She states that she has become weakened and deconditioned during the past year and now feels unstable with gait, even using a cane.  She has had bilateral TKR and rehabbed  well from this.  Her gait with a cane is actually stable today, however the pt states that sometimes her RLE "gives way" .  Her gait with a walker appears to be very stable and is appropriate for her to use.  She would benefit from a general strengthening/conditioning program and is agreeable to do OP PT.  We will try to get this arranged.    PT Assessment  All further PT needs can be met in the next venue of care    Follow Up Recommendations       Does the patient have the potential to tolerate intense rehabilitation      Barriers to Discharge        Equipment Recommendations       Recommendations for Other Services     Frequency      Precautions / Restrictions Precautions Precautions: Fall Restrictions Weight Bearing Restrictions: No   Pertinent Vitals/Pain       Mobility  Bed Mobility Bed Mobility: Supine to Sit;Sit to Supine Supine to Sit: HOB elevated;6: Modified independent (Device/Increase time) Details for Bed Mobility Assistance: sleeps with HOB elevated Transfers Transfers: Sit to Stand;Stand to Sit Sit to Stand: 6: Modified independent (Device/Increase time);From bed;With upper extremity assist Stand to Sit: 6: Modified independent (Device/Increase time);With upper extremity assist;To bed;To chair/3-in-1 Ambulation/Gait Ambulation/Gait Assistance: 6: Modified independent (Device/Increase time) Ambulation  Distance (Feet): 200 Feet Assistive device: Rolling walker Gait Pattern: Within Functional Limits Gait velocity: WNL Stairs: No Wheelchair Mobility Wheelchair Mobility: No    Exercises     PT Diagnosis:    PT Problem List: Decreased strength;Decreased activity tolerance;Decreased mobility;Cardiopulmonary status limiting activity;Obesity PT Treatment Interventions:     PT Goals    Visit Information  Last PT Received On: 11/06/12    Subjective Data  Subjective: I don't feel safe when I'm walking Patient Stated Goal: to get stronger   Prior Functioning  Home Living Lives With: Family Available Help at Discharge: Family;Available PRN/intermittently Type of Home: House Home Access: Stairs to enter [employer] of Steps: 2 Entrance Stairs-Rails: None Home Layout: One level Bathroom Shower/Tub: Engineer, manufacturing systems: Standard Home Adaptive Equipment: Straight cane Additional Comments: uses cane for gait Prior Function Level of Independence: Independent with assistive device(s) Able to Take Stairs?: Yes Driving: No Vocation: Unemployed Communication Communication: No difficulties    Cognition  Cognition Arousal/Alertness: Awake/alert Behavior During Therapy: WFL for tasks assessed/performed Overall Cognitive Status: Within Functional Limits for tasks assessed    Extremity/Trunk Assessment Right Lower Extremity Assessment RLE ROM/Strength/Tone: WFL for tasks assessed RLE Sensation: WFL - Light Touch RLE Coordination: WFL - gross motor Left Lower Extremity Assessment LLE ROM/Strength/Tone: WFL for tasks assessed LLE Sensation: WFL - Light Touch LLE Coordination: WFL - gross motor Trunk Assessment Trunk Assessment: Normal   Balance Balance Balance Assessed: Yes (WNL by functional observation)  End of Session PT - End of Session Equipment Utilized During Treatment: Gait belt  Activity Tolerance: Patient tolerated treatment well Patient left: in  chair;with call bell/phone within reach  GP     Myrlene Broker L 11/06/2012, 11:21 AM

## 2012-11-07 LAB — BASIC METABOLIC PANEL
BUN: 17 mg/dL (ref 6–23)
CO2: 31 mEq/L (ref 19–32)
Chloride: 102 mEq/L (ref 96–112)
GFR calc Af Amer: >90 mL/min (ref >90)
Glucose, Bld: 149 mg/dL — ABNORMAL HIGH (ref 70–99)
Potassium: 4.3 mEq/L (ref 3.5–5.1)

## 2012-11-07 MED ORDER — LEVOFLOXACIN 750 MG PO TABS: 750.0000 mg | ORAL_TABLET | Freq: Every day | ORAL | Status: DC

## 2012-11-07 MED ORDER — PREDNISONE 10 MG PO TABS: ORAL_TABLET | ORAL | Status: DC

## 2012-11-07 MED ORDER — IPRATROPIUM BROMIDE 0.02 % IN SOLN: 0.5000 mg | Freq: Two times a day (BID) | RESPIRATORY_TRACT | Status: DC

## 2012-11-07 MED ORDER — ALBUTEROL SULFATE (5 MG/ML) 0.5% IN NEBU: 2.5000 mg | INHALATION_SOLUTION | Freq: Two times a day (BID) | RESPIRATORY_TRACT | Status: DC

## 2012-11-07 MED ORDER — PREDNISONE 20 MG PO TABS
40.0000 mg | ORAL_TABLET | Freq: Every day | ORAL | Status: DC
  Administered 2012-11-07: 40 mg via ORAL
  Filled 2012-11-07: qty 2

## 2012-11-07 NOTE — Discharge Summary (Signed)
Physician Discharge Summary  Allison Sharp JXB:[id number] DOB: April 13, 1956 DOA: 11/04/2012  PCP: Cindra Presume, FNP  Admit date: 11/04/2012 Discharge date: 11/07/2012  Time spent:  minutes  Recommendations for Outpatient Follow-up:  1. Follow up with PCP 1 week for evaluation of symptoms. 2. HH PT for strength   Discharge Diagnoses:  Principal Problem:   Asthma with acute exacerbation Active Problems:   HYPOTHYROIDISM   Morbid obesity   Coronary artery disease   Anxiety   MDD (major depressive disorder), recurrent severe, without psychosis   OSA on CPAP   Acute respiratory failure   Discharge Condition: stable Diet recommendation: carb modified  Filed Weights   11/04/12 1558 11/05/12 0607  Weight: 167.831 kg (370 lb) 171.8 kg (378 lb 12 oz)    History of present illness:  57 year old female with a history of COPD, obesity sleep apnea, major depression who [facility] [place] after she was admitted for suicidal ideations presented to ED on 11/04/12 with cc dyspnea. Patient reported that she had been having shortness of breath over the previous one week, she also admited to having cough but no phlegm. She denied fever. Patient said that she tried to use albuterol inhaler which did not help her. She denied chest pain, denied nausea vomiting or diarrhea. Denied dysuria urgency or frequency of urination   Hospital Course:  Asthma exacerbation: Admitted to telemetry. Given DuoNeb nebulizers every 6 hours along with albuterol every 2 hours when necessary nebulizers. Given prednisone 60 mg in the ED, and started on solumedrol as well as Levaquin. After day #1 she quickly improved. At discharge she has normal respiratory effort with only faint wheeze. Oxygen saturation level maintained >90 on room air at rest and with exertion. She will be discharged with prednisone taper and 2 more days of levaquin to complete 5 day course.    History of CAD: Patient  had MI in 1997, echocardiogram with mild LVH and grade 1 diastolic dysfunction and BNP 64. She was given one bolus dose of lasix.  Patient is currently on Lasix 30 mg by mouth daily and will resume this at discharge. Volume status at discharge 1.4L.  HTN. Fair control during this hospitalization  Hypothyroidism: Continue Synthroid   Obstructive sleep apnea: Patient uses CPAP at bedtime. T  Depression:  Remained stable at baseline. Continue with Cymbalta, amitriptyline, valproate  Insomnia:Continue trazodone.   Morbid obesity. BMI 55.6. Nutritional consult.      Procedures:  none  Consultations:  none  Discharge Exam: Filed Vitals:   11/06/12 2055 11/07/12 0601 11/07/12 0713 11/07/12 0757  BP: 126/72 128/82    Pulse: 101   85  Temp: 97.9 F (36.6 C) 97.6 F (36.4 C)    TempSrc: Oral Oral    Resp: 20 18  17   Height:      Weight:      SpO2: 93% 92% 94% 95%    General: obese NAD Cardiovascular: RRR No MGR trace LE edema Respiratory: normal effort BS with faint expiratory wheeze otherwise clear Abdomen: soft +BS non-tender  Discharge Instructions  Discharge Orders   Future Appointments Provider Department Dept Phone   11/11/2012 8:30 AM Casimiro Needle L. Smiley Houseman, MD [facility] [phone]   12/24/2012 9:15 AM Hart Carwin, MD [facility] [phone]   Future Orders Complete By Expires     Call MD for:  difficulty breathing, headache or visual disturbances  As directed     Call MD for:  persistant nausea and vomiting  As directed     Diet - low sodium heart healthy  As directed     Increase activity slowly  As directed         Medication List    TAKE these medications       albuterol 108 (90 BASE) MCG/ACT inhaler  Commonly known as:  PROVENTIL HFA;VENTOLIN HFA  Inhale 2 puffs into the lungs every 6 (six) hours as needed for wheezing or shortness of breath.     amitriptyline 50 MG tablet  Commonly known as:  ELAVIL  Take 1  tablet (50 mg total) by mouth at bedtime.     aspirin 81 MG chewable tablet  Chew 1 tablet (81 mg total) by mouth daily. For blood thinner.     atorvastatin 20 MG tablet  Commonly known as:  LIPITOR  Take 20 mg by mouth at bedtime.     buPROPion 200 MG 12 hr tablet  Commonly known as:  WELLBUTRIN SR  Take 200 mg by mouth at bedtime. For depression     cetirizine 10 MG tablet  Commonly known as:  ZYRTEC  Take 10 mg by mouth every morning.     Cholecalciferol 2000 UNITS Tabs  Take 1 tablet (2,000 Units total) by mouth daily with breakfast. Nutritional supplement.     divalproex 500 MG 24 hr tablet  Commonly known as:  DEPAKOTE ER  Take 3 tablets (1,500 mg total) by mouth at bedtime. On occasions may take one during the day for extreme stress.     donepezil 10 MG tablet  Commonly known as:  ARICEPT  Take 1 tablet (10 mg total) by mouth daily with breakfast.     DULoxetine 60 MG capsule  Commonly known as:  CYMBALTA  Take 1 capsule (60 mg total) by mouth 2 (two) times daily.     fluticasone 50 MCG/ACT nasal spray  Commonly known as:  FLONASE  Place 1 spray into the nose 2 (two) times daily.     furosemide 20 MG tablet  Commonly known as:  LASIX  Take 1.5 tablets (30 mg total) by mouth daily. For blood pressure control.     gabapentin 100 MG capsule  Commonly known as:  NEURONTIN  Take 2 capsules (200 mg total) by mouth 3 (three) times daily. For pain and mood control     levofloxacin 750 MG tablet  Commonly known as:  LEVAQUIN  Take 1 tablet (750 mg total) by mouth daily.     levothyroxine 125 MCG tablet  Commonly known as:  SYNTHROID, LEVOTHROID  Take 1 tablet (125 mcg total) by mouth daily before breakfast. For hypothyroidism.     Magnesium 200 MG Tabs  Take 1 tablet by mouth daily.     montelukast 10 MG tablet  Commonly known as:  SINGULAIR  Take 1 tablet (10 mg total) by mouth at bedtime.     multivitamin with minerals Tabs  Take 1 tablet by mouth daily.  For nutritional supplementation.     niacin 500 MG CR tablet  Commonly known as:  NIASPAN  Take 500 mg by mouth at bedtime.     nystatin 100000 UNIT/GM Powd  Apply 1 g topically 3 (three) times daily as needed (redness and moisture).     omeprazole 20 MG capsule  Commonly known as:  PRILOSEC  Take 1 capsule (20 mg total) by mouth daily.     polyethylene glycol packet  Commonly known as:  MIRALAX / GLYCOLAX  Take 17 g by mouth daily.     predniSONE 10 MG tablet  Commonly known as:  DELTASONE  Take 4 tabs 6/13, 6/14, 6/15, take 3 tabs 6/16, 6/17, 6/18, take 2 tabs 6/19, 6/20, 6/21, take 1 tab 6/22, 6/23, 6/24 then stop     sennosides-docusate sodium 8.6-50 MG tablet  Commonly known as:  SENOKOT-S  Take 1 tablet by mouth daily.     traZODone 100 MG tablet  Commonly known as:  DESYREL  Take 1 tablet (100 mg total) by mouth at bedtime as needed for sleep or depression.     verapamil 180 MG CR tablet  Commonly known as:  CALAN-SR  Take 180 mg by mouth every morning.       Allergies  Allergen Reactions  . Haldol (Haloperidol) Other (See Comments)    Hallucinating   . Ativan (Lorazepam) Other (See Comments)    Delirium  . Naproxen Nausea And Vomiting      The results of significant diagnostics from this hospitalization (including imaging, microbiology, ancillary and laboratory) are listed below for reference.    Significant Diagnostic Studies: Dg Chest 2 View  11/04/2012   *RADIOLOGY REPORT*  Clinical Data: Shortness of breath, cough  CHEST - 2 VIEW  Comparison: 08/15/2012  Findings: Lungs mildly hyperinflated with somewhat coarse interstitial markings.  Heart size normal.  Tortuous thoracic aorta.  No effusion.  IMPRESSION:  1.  No acute disease   Original Report Authenticated By: D. Andria Rhein, MD    Microbiology: Recent Results (from the past 240 hour(s))  URINE CULTURE     Status: None   Collection Time    11/04/12  5:35 PM      Result Value Range Status    Specimen Description URINE, CLEAN CATCH   Final   Special Requests NONE   Final   Culture  Setup Time 11/05/2012 02:46   Final   Colony Count NO GROWTH   Final   Culture NO GROWTH   Final   Report Status 11/06/2012 FINAL   Final     Labs: Basic Metabolic Panel:  Recent Labs Lab 11/04/12 1634 11/05/12 0442 11/06/12 0435 11/07/12 0445  NA 139 141 141 141  K 3.9 4.2 4.1 4.3  CL 101 104 102 102  CO2 29 28 29 31   GLUCOSE 100* 114* 155* 149*  BUN 14 13 14 17   CREATININE 0.81 0.66 0.74 0.73  CALCIUM 9.1 9.3 9.7 9.6   Liver Function Tests:  Recent Labs Lab 11/05/12 0442  AST 13  ALT 8  ALKPHOS 82  BILITOT 0.3  PROT 6.9  ALBUMIN 3.4*   No results found for this basename: LIPASE, AMYLASE,  in the last 168 hours No results found for this basename: AMMONIA,  in the last 168 hours CBC:  Recent Labs Lab 11/04/12 1634 11/05/12 0442 11/06/12 0435  WBC 9.0 9.0 10.4  NEUTROABS 4.6  --   --   HGB 13.0 12.7 13.1  HCT 38.3 38.6 39.7  MCV 91.8 91.7 92.1  PLT 158 154 167   Cardiac Enzymes:  Recent Labs Lab 11/04/12 1634  TROPONINI <0.30   BNP: BNP (last 3 results)  Recent Labs  11/05/12 0442  PROBNP 64.9   CBG: No results found for this basename: GLUCAP,  in the last 168 hours     Signed:  Gwenyth Bender  [facility] 11/07/2012, 9:15 AM Attending: Patient seen and examined. The patient is medically stable for discharge.

## 2012-11-07 NOTE — Progress Notes (Signed)
Patient with orders to be discharge home. Discharge instructions given, patient verbalized understanding. Prescriptions given. Patient in stable condition upon discharge. Patient left with sister in private vehicle.

## 2012-11-11 ENCOUNTER — Telehealth (HOSPITAL_COMMUNITY): Payer: Self-pay | Admitting: Psychiatry

## 2012-11-11 ENCOUNTER — Ambulatory Visit: Payer: Self-pay | Admitting: Neurology

## 2012-11-11 MED ORDER — TRAZODONE HCL 100 MG PO TABS: 100.0000 mg | ORAL_TABLET | Freq: Every evening | ORAL | Status: DC | PRN

## 2012-11-11 MED ORDER — BUPROPION HCL ER (SR) 200 MG PO TB12: 200.0000 mg | ORAL_TABLET | Freq: Every morning | ORAL | Status: DC

## 2012-11-11 NOTE — Telephone Encounter (Signed)
Scripts written per request.

## 2012-11-12 ENCOUNTER — Ambulatory Visit (HOSPITAL_COMMUNITY): Payer: No Typology Code available for payment source | Admitting: Psychiatry

## 2012-11-18 ENCOUNTER — Ambulatory Visit (INDEPENDENT_AMBULATORY_CARE_PROVIDER_SITE_OTHER): Payer: Medicare Other | Admitting: Nurse Practitioner

## 2012-11-18 ENCOUNTER — Encounter: Payer: Self-pay | Admitting: Nurse Practitioner

## 2012-11-18 VITALS — BP 128/86 | HR 83 | Temp 98.1°F | Resp 18 | Ht 69.0 in | Wt 356.0 lb

## 2012-11-18 DIAGNOSIS — Z09 Encounter for follow-up examination after completed treatment for conditions other than malignant neoplasm: Secondary | ICD-10-CM

## 2012-11-18 DIAGNOSIS — J45901 Unspecified asthma with (acute) exacerbation: Secondary | ICD-10-CM

## 2012-11-18 DIAGNOSIS — J441 Chronic obstructive pulmonary disease with (acute) exacerbation: Secondary | ICD-10-CM

## 2012-11-18 DIAGNOSIS — M1710 Unilateral primary osteoarthritis, unspecified knee: Secondary | ICD-10-CM

## 2012-11-18 DIAGNOSIS — J4489 Other specified chronic obstructive pulmonary disease: Secondary | ICD-10-CM

## 2012-11-18 DIAGNOSIS — M1711 Unilateral primary osteoarthritis, right knee: Secondary | ICD-10-CM

## 2012-11-18 DIAGNOSIS — J449 Chronic obstructive pulmonary disease, unspecified: Secondary | ICD-10-CM

## 2012-11-18 DIAGNOSIS — IMO0002 Reserved for concepts with insufficient information to code with codable children: Secondary | ICD-10-CM

## 2012-11-18 DIAGNOSIS — I10 Essential (primary) hypertension: Secondary | ICD-10-CM

## 2012-11-18 DIAGNOSIS — G4733 Obstructive sleep apnea (adult) (pediatric): Secondary | ICD-10-CM

## 2012-11-18 NOTE — Patient Instructions (Signed)
I'll see you again in November for follow up on Hypertension, thyroid, cholesterol and pap smear. In the meantime, you will see pulmonology, Gi, and the [facility]. Great to see you today!

## 2012-11-19 ENCOUNTER — Encounter: Payer: Self-pay | Admitting: Nurse Practitioner

## 2012-11-19 MED ORDER — DICLOFENAC SODIUM 1 % TD GEL: 4.0000 g | Freq: Four times a day (QID) | TRANSDERMAL | Status: DC

## 2012-11-19 NOTE — Progress Notes (Signed)
Subjective:     Allison Sharp is a 57 y.o. female and is here for a follow up post-hospitalization for asthma exacerbation (notes reviewed). She was hospitalized for 4 days and treated with prednisone, duonebs q6h, and levaquin. She will finish the prednisone taper in 2 days. She reports relief of SOB and wheezing since being discharged from the hospital. CXR during hospitalization showed interstitial markings. Pt has Hx of COPD and obstructive sleep apnea. Likely these conditions are exacerbated by her weight, BMI 52. She has not had a sleep study in several years, nor has her CPAP been re-evaluated since 2012. I will consult pulmonology for eval & treatment of ongoing management.  Prior to this hospitalization, she was admitted to behavioral health for worsened depression with homicidal ideation (notes reviewed). She will continue to be followed as outpatient by behavioral health.   We discussed weight loss and the fact that many of her health problems are r/t to weight, such as hypercholesterolemia, arthritis, asthma, and sleep apnea. I will refer to Allison Sharp at [facility] for medical management of weight loss.   History   Social History  . Marital Status: Single    Spouse Name: N/A    Number of Children: 2  . Years of Education: N/A   Occupational History  . Disabled     back problems   Social History Main Topics  . Smoking status: Passive Smoke Exposure - Never Smoker  . Smokeless tobacco: Never Used  . Alcohol Use: No  . Drug Use: No  . Sexually Active: No   Other Topics Concern  . Not on file   Social History Narrative   Allison Sharp is disabled and lives with her sister and niece. Her sister is trained as a Publishing rights manager, but not currently practicing.   She has two grown sons that she does not see often, as they live in other states.   Allison Sharp has a long history of mental illness including depression, PTSD, suicidal and homicidal ideation.   She has been  obese most all of her life. Her weight has significantly impacted her QOL. She is currently using a walker to help with ambulation.    Health Maintenance  Topic Date Due  . Influenza Vaccine  01/27/2013  . Mammogram  02/28/2014  . Pap Smear  02/27/2015  . Colonoscopy  05/29/2017  . Tetanus/tdap  10/01/2022    The following portions of the patient's history were reviewed and updated as appropriate: allergies, current medications, past family history, past medical history, past social history, past surgical history and problem list.  Review of Systems Constitutional: positive for obesity, negative for anorexia and fevers Eyes: negative for irritation, redness and visual disturbance Ears, nose, mouth, throat, and face: positive for extremely dry mouth Respiratory: negative for wheezing and recent hospitalization for asthma exacerbation Cardiovascular: negative for chest pain and chest pressure/discomfort Gastrointestinal: negative for abdominal pain, constipation, diarrhea, dysphagia and reflux symptoms Musculoskeletal:positive for arthralgias, back pain and bilateral knee pain, has had bilateral knee replacements Neurological: positive for headaches and memory problems, negative for seizures and tremors Behavioral/Psych: positive for depression, sleep disturbance and recent hospitalization for worsened depression and homicidal and suicidal ideation. Endocrine: negative for diabetic symptoms including polydipsia, polyphagia and polyuria, temperature intolerance and currently treated for hypothyroidism   Objective:    BP 128/86  Pulse 83  Temp(Src) 98.1 F (36.7 C) (Oral)  Resp 18  Ht 5\' 9"  (1.753 m)  Wt 356 lb (161.481 kg)  BMI 52.55 kg/m2  SpO2 98%  LMP 02/18/1995 General appearance: alert, cooperative, appears stated age, no distress and morbidly obese Head: Normocephalic, without obvious abnormality, atraumatic Eyes: negative findings: lids and lashes normal, conjunctivae and  sclerae normal, corneas clear and pupils equal, round, reactive to light and accomodation Throat: abnormal findings: angular stomatitis, fissured tongue and xerostomia Neck: no adenopathy, no carotid bruit, supple, symmetrical, trachea midline and thyroid not enlarged, symmetric, no tenderness/mass/nodules Lungs: clear to auscultation bilaterally and takes deep breath every few minutes-resembles tic Heart: regular rate and rhythm, S1, S2 normal, no murmur, click, rub or gallop Abdomen: abnormal findings:  obese and epigastric tenderness Extremities: extremities normal, atraumatic, no cyanosis or edema R knee warm Pulses: 2+ and symmetric Skin: Skin color, texture, turgor normal. No rashes or lesions Lymph nodes: Cervical, supraclavicular, and axillary nodes normal. Neurologic: Grossly normal    Assessment:   1. Hospital follow-up for asthma/COPD exacerbation 2.obstructive sleep apnea 3.severe obesity 4.HTN-well controlled 5. Osteoarthritis knees, neck. Hx bilateral knee replacements 6.hypercholesterolemia 7.hypothyroidism       Plan:    1-2. Ref to pulmonology fro eval & Tx of COPD, obstructive sleep apnea 3. Refer to [facility] for medical weight loss 4.Continue current meds 5.Voltaren gel & weight loss 6.Continue current meds, check labs in Sept. 7. Cont current meds, check TSH in Sept.  See After Visit Summary for Counseling Recommendations

## 2012-11-20 ENCOUNTER — Encounter (HOSPITAL_COMMUNITY): Payer: Self-pay | Admitting: Psychiatry

## 2012-11-20 ENCOUNTER — Ambulatory Visit (INDEPENDENT_AMBULATORY_CARE_PROVIDER_SITE_OTHER): Payer: No Typology Code available for payment source | Admitting: Psychiatry

## 2012-11-20 VITALS — BP 110/63 | HR 85 | Ht 68.0 in | Wt 377.4 lb

## 2012-11-20 DIAGNOSIS — F5105 Insomnia due to other mental disorder: Secondary | ICD-10-CM

## 2012-11-20 DIAGNOSIS — F319 Bipolar disorder, unspecified: Secondary | ICD-10-CM

## 2012-11-20 DIAGNOSIS — M549 Dorsalgia, unspecified: Secondary | ICD-10-CM

## 2012-11-20 DIAGNOSIS — E559 Vitamin D deficiency, unspecified: Secondary | ICD-10-CM

## 2012-11-20 DIAGNOSIS — F429 Obsessive-compulsive disorder, unspecified: Secondary | ICD-10-CM

## 2012-11-20 DIAGNOSIS — F250 Schizoaffective disorder, bipolar type: Secondary | ICD-10-CM

## 2012-11-20 DIAGNOSIS — F419 Anxiety disorder, unspecified: Secondary | ICD-10-CM

## 2012-11-20 DIAGNOSIS — R413 Other amnesia: Secondary | ICD-10-CM

## 2012-11-20 DIAGNOSIS — G8929 Other chronic pain: Secondary | ICD-10-CM

## 2012-11-20 DIAGNOSIS — F4310 Post-traumatic stress disorder, unspecified: Secondary | ICD-10-CM

## 2012-11-20 MED ORDER — GABAPENTIN 100 MG PO CAPS: 200.0000 mg | ORAL_CAPSULE | Freq: Three times a day (TID) | ORAL | Status: DC

## 2012-11-20 MED ORDER — TRAZODONE HCL 100 MG PO TABS: 150.0000 mg | ORAL_TABLET | Freq: Every evening | ORAL | Status: DC | PRN

## 2012-11-20 MED ORDER — BUPROPION HCL ER (SR) 200 MG PO TB12: 200.0000 mg | ORAL_TABLET | Freq: Every morning | ORAL | Status: DC

## 2012-11-20 MED ORDER — AMITRIPTYLINE HCL 50 MG PO TABS: 50.0000 mg | ORAL_TABLET | Freq: Every day | ORAL | Status: DC

## 2012-11-20 NOTE — Patient Instructions (Signed)
Keep it up.  Find some creative outlet.  Call if problems or concerns.

## 2012-11-20 NOTE — Progress Notes (Signed)
[facility] [id number] Progress Note Allison Sharp MRN: [id number] DOB: 04/16/1956 Age: 57 y.o.  Date: 11/20/2012 Start Time: 11:30 AM End Time: 11:53 AM  Chief Complaint: Chief Complaint  Patient presents with  . Depression  . Other  . Follow-up  . Medication Refill   Subjective: "I got my meds adjusted in [facility], but then went back in the hospital for another 4 days for my asthma". Depression 0/10 and Anxiety 0/10, where 1 is the best and 10 is the worst.  Pain is 4/10 her knees  History of presenting illness Patient is 57 year old Caucasian female who came for her follow up appointment.  Pt reports that she is compliant with the psychotropic medications with good benefit and no noticeable side effects.  Side effect of hand tremors went away.  She is looking pretty well now.  Current psychiatric medication Depakote 1500 mg at bedtime Cymbalta 60 mg twice a day Aricept 10 mg in AM Wellbutrin SR 200 mg daily Neurontin 200 mg three times a day Elavil 50 mg at bed time Trazodone 100 mg   Past psychiatric history Patient has multiple psychiatric admission. She has at least 3 his suicidal attempt in the past. Her last admission was in August 2013.  In the past she had tried Paxil Prozac Wellbutrin Effexor Lexapro amitriptyline however she always had a good response with Cymbalta but it causes constipation, which is relieved by Miralax. She has been diagnosed with bipolar disorder  Vitals: BP 110/63  Pulse 85  Ht 5\' 8"  (1.727 m)  Wt 377 lb 6.4 oz (171.188 kg)  BMI 57.4 kg/m2  LMP 02/18/1995  Allergies: Allergies  Allergen Reactions  . Haldol (Haloperidol) Other (See Comments)    Hallucinating   . Ativan (Lorazepam) Other (See Comments)    Delirium  . Naproxen Nausea And Vomiting   Medical History: Past Medical History  Diagnosis Date  . Hyperlipidemia   . CAD (coronary artery disease)   . Bipolar 1 disorder   . GERD (gastroesophageal reflux disease)   .  Hyperthyroidism   . Chronic back pain   . Dyspnea     PFT 03/05/09 FEV1 2.77(98%), FVC 3.25(86%), FEV1% 85, TLC 5.88(99%), DLCO 60% ,  Methacholine challenge 03/16/09 normal ,  CT chest 03/12/09 no pulmonary disease  . Anxiety   . Arthritis   . Depression   . OSA on CPAP     2 liters  . HTN (hypertension)   . History of colonoscopy 10/17/2002    by Dr Rehman-> distal non-specific proctitis, small ext hemorrhoids,   . Fungal infection   . Cellulitis   . Contusion of sacrum   . Migraine headache   . Vitamin D deficiency   . Sleep apnea   . Myocardial infarction     NOV 1997  . Hypothyroidism     States she only has hyperthyroidism  . Complication of anesthesia     States she typically gets sick s/p anesthesia  . Morbid obesity with body mass index of 50.0-59.9 in adult JAN 2011 370 LBS    2004 311 BMI 45.9  . Obsessive-compulsive disorder   . PTSD (post-traumatic stress disorder)   . Asthma   . Allergy   . Urine incontinence   . Chronic headaches   . Suicidal ideation   . COPD (chronic obstructive pulmonary disease)   . Schizoaffective disorder, bipolar type   Patient has history of hyperlipidemia, GERD, chronic back pain, obesity, arthritis, hypertension, abstract and sleep apnea, vitamin  D deficiency and history of myocardial infarction. Her primary care physician is  Dr. Ileana Ladd in [facility].  Surgical History: Past Surgical History  Procedure Laterality Date  . Cholecystectomy    . Knee arthroscopy    . Appendectomy    . Tonsillectomy    . Back surgery  2008  . Total vaginal hysterectomy    . Abdominal hysterectomy      sept 1996  . Tubal ligation    . Colonoscopy  10/17/2002     Distal proctitis, small external hemorrhoids, otherwise/  normal colonoscopy. Suspect rectal bleeding secondary to hemorrhoids  . Esophagogastroduodenoscopy  03/18/09    fundic gland polyps/mild gastritis  . Cardiac catheterization      nov 1997  . Joint  replacement      bil knee replacement  . Hernia repair  1978   Family History family history includes Alcohol abuse in her father; Bipolar disorder in her brother, mother, and sister; Cancer in her mother and son; Coronary artery disease in her brother and father; Dementia in her mother; Depression in her brother, mother, and sisters; Hypertension in her brother, mother, and sister; and Paranoid behavior in her sister.  There is no history of Anesthesia problems, and Hypotension, and Malignant hyperthermia, and Pseudochol deficiency, . Reviewed and nothing is new today.  Social history Patient currently living with her sister .  She has a plan to move into assisted living in the future.    Mental status examination Patient is casually dressed and fairly groomed.  She is obese female.  She maintained fair eye contact.  Her speech is fast but clear and coherent.  Her thought process is also fast but logical linear and goal-directed.  She appears today more agitated than her last visit.  She described her mood is good and her affect is bright.  He denies any active or passive suicidal thoughts or homicidal thoughts.  She denies any auditory or visual hallucination.  Her fund of knowledge is adequate.  There were no tremors or shakes.  She denies any delusion or paranoid thinking.  Her attention and concentration is the best as I have seen with her.  She's alert oriented x3.  Her insight judgment and impulse control is okay.    Lab Results:  Results for orders placed during the hospital encounter of 11/04/12 (from the past 2016 hour(s))  BASIC METABOLIC PANEL   Collection Time    11/04/12  4:34 PM      Result Value Range   Sodium 139  135 - 145 mEq/L   Potassium 3.9  3.5 - 5.1 mEq/L   Chloride 101  96 - 112 mEq/L   CO2 29  19 - 32 mEq/L   Glucose, Bld 100 (*) 70 - 99 mg/dL   BUN 14  6 - 23 mg/dL   Creatinine, Ser 1.61  0.50 - 1.10 mg/dL   Calcium 9.1  8.4 - 09.6 mg/dL   GFR calc non Af Amer  79 (*) >90 mL/min   GFR calc Af Amer >90  >90 mL/min  CBC WITH DIFFERENTIAL   Collection Time    11/04/12  4:34 PM      Result Value Range   WBC 9.0  4.0 - 10.5 K/uL   RBC 4.17  3.87 - 5.11 MIL/uL   Hemoglobin 13.0  12.0 - 15.0 g/dL   HCT 04.5  40.9 - 81.1 %   MCV 91.8  78.0 - 100.0 fL   MCH  31.2  26.0 - 34.0 pg   MCHC 33.9  30.0 - 36.0 g/dL   RDW 16.1  09.6 - 04.5 %   Platelets 158  150 - 400 K/uL   Neutrophils Relative % 52  43 - 77 %   Neutro Abs 4.6  1.7 - 7.7 K/uL   Lymphocytes Relative 35  12 - 46 %   Lymphs Abs 3.2  0.7 - 4.0 K/uL   Monocytes Relative 7  3 - 12 %   Monocytes Absolute 0.6  0.1 - 1.0 K/uL   Eosinophils Relative 6 (*) 0 - 5 %   Eosinophils Absolute 0.5  0.0 - 0.7 K/uL   Basophils Relative 1  0 - 1 %   Basophils Absolute 0.1  0.0 - 0.1 K/uL  TROPONIN I   Collection Time    11/04/12  4:34 PM      Result Value Range   Troponin I <0.30  <0.30 ng/mL  URINE CULTURE   Collection Time    11/04/12  5:35 PM      Result Value Range   Specimen Description URINE, CLEAN CATCH     Special Requests NONE     Culture  Setup Time 11/05/2012 02:46     Colony Count NO GROWTH     Culture NO GROWTH     Report Status 11/06/2012 FINAL    URINALYSIS, ROUTINE W REFLEX MICROSCOPIC   Collection Time    11/04/12  5:35 PM      Result Value Range   Color, Urine YELLOW  YELLOW   APPearance CLEAR  CLEAR   Specific Gravity, Urine 1.010  1.005 - 1.030   pH 8.0  5.0 - 8.0   Glucose, UA NEGATIVE  NEGATIVE mg/dL   Hgb urine dipstick NEGATIVE  NEGATIVE   Bilirubin Urine NEGATIVE  NEGATIVE   Ketones, ur NEGATIVE  NEGATIVE mg/dL   Protein, ur NEGATIVE  NEGATIVE mg/dL   Urobilinogen, UA 0.2  0.0 - 1.0 mg/dL   Nitrite NEGATIVE  NEGATIVE   Leukocytes, UA NEGATIVE  NEGATIVE  CBC   Collection Time    11/05/12  4:42 AM      Result Value Range   WBC 9.0  4.0 - 10.5 K/uL   RBC 4.21  3.87 - 5.11 MIL/uL   Hemoglobin 12.7  12.0 - 15.0 g/dL   HCT 40.9  81.1 - 91.4 %   MCV 91.7  78.0 -  100.0 fL   MCH 30.2  26.0 - 34.0 pg   MCHC 32.9  30.0 - 36.0 g/dL   RDW 78.2  95.6 - 21.3 %   Platelets 154  150 - 400 K/uL  COMPREHENSIVE METABOLIC PANEL   Collection Time    11/05/12  4:42 AM      Result Value Range   Sodium 141  135 - 145 mEq/L   Potassium 4.2  3.5 - 5.1 mEq/L   Chloride 104  96 - 112 mEq/L   CO2 28  19 - 32 mEq/L   Glucose, Bld 114 (*) 70 - 99 mg/dL   BUN 13  6 - 23 mg/dL   Creatinine, Ser 0.86  0.50 - 1.10 mg/dL   Calcium 9.3  8.4 - 57.8 mg/dL   Total Protein 6.9  6.0 - 8.3 g/dL   Albumin 3.4 (*) 3.5 - 5.2 g/dL   AST 13  0 - 37 U/L   ALT 8  0 - 35 U/L   Alkaline Phosphatase 82  39 - 117 U/L  Total Bilirubin 0.3  0.3 - 1.2 mg/dL   GFR calc non Af Amer >90  >90 mL/min   GFR calc Af Amer >90  >90 mL/min  PRO B NATRIURETIC PEPTIDE   Collection Time    11/05/12  4:42 AM      Result Value Range   Pro B Natriuretic peptide (BNP) 64.9  0 - 125 pg/mL  CBC   Collection Time    11/06/12  4:35 AM      Result Value Range   WBC 10.4  4.0 - 10.5 K/uL   RBC 4.31  3.87 - 5.11 MIL/uL   Hemoglobin 13.1  12.0 - 15.0 g/dL   HCT 82.9  56.2 - 13.0 %   MCV 92.1  78.0 - 100.0 fL   MCH 30.4  26.0 - 34.0 pg   MCHC 33.0  30.0 - 36.0 g/dL   RDW 86.5  78.4 - 69.6 %   Platelets 167  150 - 400 K/uL  BASIC METABOLIC PANEL   Collection Time    11/06/12  4:35 AM      Result Value Range   Sodium 141  135 - 145 mEq/L   Potassium 4.1  3.5 - 5.1 mEq/L   Chloride 102  96 - 112 mEq/L   CO2 29  19 - 32 mEq/L   Glucose, Bld 155 (*) 70 - 99 mg/dL   BUN 14  6 - 23 mg/dL   Creatinine, Ser 2.95  0.50 - 1.10 mg/dL   Calcium 9.7  8.4 - 28.4 mg/dL   GFR calc non Af Amer >90  >90 mL/min   GFR calc Af Amer >90  >90 mL/min  BASIC METABOLIC PANEL   Collection Time    11/07/12  4:45 AM      Result Value Range   Sodium 141  135 - 145 mEq/L   Potassium 4.3  3.5 - 5.1 mEq/L   Chloride 102  96 - 112 mEq/L   CO2 31  19 - 32 mEq/L   Glucose, Bld 149 (*) 70 - 99 mg/dL   BUN 17  6 - 23  mg/dL   Creatinine, Ser 1.32  0.50 - 1.10 mg/dL   Calcium 9.6  8.4 - 44.0 mg/dL   GFR calc non Af Amer >90  >90 mL/min   GFR calc Af Amer >90  >90 mL/min  Results for orders placed during the hospital encounter of 10/24/12 (from the past 2016 hour(s))  CBC   Collection Time    10/24/12  8:30 AM      Result Value Range   WBC 7.8  4.0 - 10.5 K/uL   RBC 4.28  3.87 - 5.11 MIL/uL   Hemoglobin 12.6  12.0 - 15.0 g/dL   HCT 10.2  72.5 - 36.6 %   MCV 90.4  78.0 - 100.0 fL   MCH 29.4  26.0 - 34.0 pg   MCHC 32.6  30.0 - 36.0 g/dL   RDW 44.0  34.7 - 42.5 %   Platelets 166  150 - 400 K/uL  BASIC METABOLIC PANEL   Collection Time    10/24/12  8:30 AM      Result Value Range   Sodium 137  135 - 145 mEq/L   Potassium 4.0  3.5 - 5.1 mEq/L   Chloride 100  96 - 112 mEq/L   CO2 29  19 - 32 mEq/L   Glucose, Bld 102 (*) 70 - 99 mg/dL   BUN 15  6 - 23  mg/dL   Creatinine, Ser 4.78  0.50 - 1.10 mg/dL   Calcium 9.8  8.4 - 29.5 mg/dL   GFR calc non Af Amer >90  >90 mL/min   GFR calc Af Amer >90  >90 mL/min  VALPROIC ACID LEVEL   Collection Time    10/24/12  8:30 AM      Result Value Range   Valproic Acid Lvl 69.4  50.0 - 100.0 ug/mL  ETHANOL   Collection Time    10/24/12  8:30 AM      Result Value Range   Alcohol, Ethyl (B) <11  0 - 11 mg/dL  URINALYSIS, ROUTINE W REFLEX MICROSCOPIC   Collection Time    10/24/12  9:13 AM      Result Value Range   Color, Urine YELLOW  YELLOW   APPearance CLEAR  CLEAR   Specific Gravity, Urine 1.021  1.005 - 1.030   pH 5.5  5.0 - 8.0   Glucose, UA NEGATIVE  NEGATIVE mg/dL   Hgb urine dipstick NEGATIVE  NEGATIVE   Bilirubin Urine NEGATIVE  NEGATIVE   Ketones, ur NEGATIVE  NEGATIVE mg/dL   Protein, ur NEGATIVE  NEGATIVE mg/dL   Urobilinogen, UA 1.0  0.0 - 1.0 mg/dL   Nitrite NEGATIVE  NEGATIVE   Leukocytes, UA NEGATIVE  NEGATIVE  URINE RAPID DRUG SCREEN (HOSP PERFORMED)   Collection Time    10/24/12  9:13 AM      Result Value Range   Opiates NONE  DETECTED  NONE DETECTED   Cocaine NONE DETECTED  NONE DETECTED   Benzodiazepines NONE DETECTED  NONE DETECTED   Amphetamines NONE DETECTED  NONE DETECTED   Tetrahydrocannabinol NONE DETECTED  NONE DETECTED   Barbiturates NONE DETECTED  NONE DETECTED  Results for orders placed in visit on 09/30/12 (from the past 2016 hour(s))  CBC   Collection Time    09/30/12 11:57 AM      Result Value Range   WBC 9.5  4.5 - 10.5 K/uL   RBC 4.39  3.87 - 5.11 Mil/uL   Platelets 187.0  150.0 - 400.0 K/uL   Hemoglobin 13.7  12.0 - 15.0 g/dL   HCT 62.1  30.8 - 65.7 %   MCV 91.3  78.0 - 100.0 fl   MCHC 34.1  30.0 - 36.0 g/dL   RDW 84.6  96.2 - 95.2 %  COMPREHENSIVE METABOLIC PANEL   Collection Time    09/30/12 11:57 AM      Result Value Range   Sodium 139  135 - 145 mEq/L   Potassium 4.2  3.5 - 5.1 mEq/L   Chloride 101  96 - 112 mEq/L   CO2 30  19 - 32 mEq/L   Glucose, Bld 75  70 - 99 mg/dL   BUN 13  6 - 23 mg/dL   Creatinine, Ser 0.9  0.4 - 1.2 mg/dL   Total Bilirubin 0.5  0.3 - 1.2 mg/dL   Alkaline Phosphatase 96  39 - 117 U/L   AST 16  0 - 37 U/L   ALT 13  0 - 35 U/L   Total Protein 7.3  6.0 - 8.3 g/dL   Albumin 4.0  3.5 - 5.2 g/dL   Calcium 9.3  8.4 - 84.1 mg/dL   GFR 32.44  >01.02 mL/min  CHOLESTEROL, TOTAL   Collection Time    09/30/12 11:57 AM      Result Value Range   Cholesterol 167  0 - 200 mg/dL  TSH  Collection Time    09/30/12 11:57 AM      Result Value Range   TSH 2.72  0.35 - 5.50 uIU/mL  HEMOGLOBIN A1C   Collection Time    09/30/12 11:57 AM      Result Value Range   Hemoglobin A1C 5.4  4.6 - 6.5 %  VITAMIN D 25 HYDROXY   Collection Time    09/30/12 11:57 AM      Result Value Range   Vit D, 25-Hydroxy 37  30 - 89 ng/mL  SEDIMENTATION RATE   Collection Time    09/30/12 11:57 AM      Result Value Range   Sed Rate 12  0 - 22 mm/hr  ANA   Collection Time    09/30/12 11:57 AM      Result Value Range   ANA NEG  NEGATIVE  URIC ACID   Collection Time     09/30/12 11:57 AM      Result Value Range   Uric Acid, Serum 5.0  2.4 - 7.0 mg/dL  POCT URINALYSIS DIPSTICK   Collection Time    09/30/12 12:08 PM      Result Value Range   Color, UA yellow     Clarity, UA clear     Glucose, UA neg     Bilirubin, UA neg     Ketones, UA neg     Spec Grav, UA 1.020     Blood, UA neg     pH, UA 6.5     Protein, UA neg     Urobilinogen, UA 1.0     Nitrite, UA neg     Leukocytes, UA Negative      Assessment Axis I bipolar disorder, OCD, PTSD, record indicates schizophrenia, but that may be doubtful.  Axis II deferred  Axis III see medical history Axis IV mild to moderate  Axis V 55-60  Plan/Discussion: I took her vitals.  I reviewed CC, tobacco/med/surg Hx, meds effects/ side effects, problem list, therapies and responses as well as current situation/symptoms discussed options. Continue current effective medications adjust up Trazodone for better night time sedation.  See orders and pt instructions for more details.  MEDICATIONS this encounter: Meds ordered this encounter  Medications  . traZODone (DESYREL) 100 MG tablet    Sig: Take 1.5-2 tablets (150-200 mg total) by mouth at bedtime as needed for depression (insomnia).    Dispense:  60 tablet    Refill:  2  . gabapentin (NEURONTIN) 100 MG capsule    Sig: Take 2 capsules (200 mg total) by mouth 3 (three) times daily. For pain and mood control    Dispense:  180 capsule    Refill:  2  . buPROPion (WELLBUTRIN SR) 200 MG 12 hr tablet    Sig: Take 1 tablet (200 mg total) by mouth every morning. For depression    Dispense:  30 tablet    Refill:  2  . amitriptyline (ELAVIL) 50 MG tablet    Sig: Take 1 tablet (50 mg total) by mouth at bedtime.    Dispense:  30 tablet    Refill:  2   Medical Decision Making Problem Points:  Established problem, stable/improving (1), Established problem, worsening (2), Review of last therapy session (1) and Review of psycho-social stressors (1) Data Points:   Review or order clinical lab tests (1) Review of medication regiment & side effects (2) Review of new medications or change in dosage (2)  I certify that outpatient services furnished can  reasonably be expected to improve the patient's condition.   Orson Aloe, MD, [facility]

## 2012-11-21 ENCOUNTER — Telehealth: Payer: Self-pay | Admitting: Nurse Practitioner

## 2012-11-24 ENCOUNTER — Encounter (HOSPITAL_COMMUNITY): Payer: Self-pay

## 2012-11-24 ENCOUNTER — Emergency Department (HOSPITAL_COMMUNITY)
Admission: EM | Admit: 2012-11-24 | Discharge: 2012-11-24 | Disposition: A | Discharge status: Home / Self Care | Payer: Medicare Other | Attending: Emergency Medicine | Admitting: Emergency Medicine

## 2012-11-24 ENCOUNTER — Emergency Department (HOSPITAL_COMMUNITY): Payer: Medicare Other

## 2012-11-24 DIAGNOSIS — I10 Essential (primary) hypertension: Secondary | ICD-10-CM | POA: Insufficient documentation

## 2012-11-24 DIAGNOSIS — Z8619 Personal history of other infectious and parasitic diseases: Secondary | ICD-10-CM | POA: Insufficient documentation

## 2012-11-24 DIAGNOSIS — S92309A Fracture of unspecified metatarsal bone(s), unspecified foot, initial encounter for closed fracture: Secondary | ICD-10-CM | POA: Insufficient documentation

## 2012-11-24 DIAGNOSIS — F411 Generalized anxiety disorder: Secondary | ICD-10-CM | POA: Insufficient documentation

## 2012-11-24 DIAGNOSIS — Z6841 Body Mass Index (BMI) 40.0 and over, adult: Secondary | ICD-10-CM | POA: Insufficient documentation

## 2012-11-24 DIAGNOSIS — Z79899 Other long term (current) drug therapy: Secondary | ICD-10-CM | POA: Insufficient documentation

## 2012-11-24 DIAGNOSIS — W108XXA Fall (on) (from) other stairs and steps, initial encounter: Secondary | ICD-10-CM | POA: Insufficient documentation

## 2012-11-24 DIAGNOSIS — K219 Gastro-esophageal reflux disease without esophagitis: Secondary | ICD-10-CM | POA: Insufficient documentation

## 2012-11-24 DIAGNOSIS — Y9389 Activity, other specified: Secondary | ICD-10-CM | POA: Insufficient documentation

## 2012-11-24 DIAGNOSIS — G43909 Migraine, unspecified, not intractable, without status migrainosus: Secondary | ICD-10-CM | POA: Insufficient documentation

## 2012-11-24 DIAGNOSIS — E785 Hyperlipidemia, unspecified: Secondary | ICD-10-CM | POA: Insufficient documentation

## 2012-11-24 DIAGNOSIS — R45851 Suicidal ideations: Secondary | ICD-10-CM | POA: Insufficient documentation

## 2012-11-24 DIAGNOSIS — F319 Bipolar disorder, unspecified: Secondary | ICD-10-CM | POA: Insufficient documentation

## 2012-11-24 DIAGNOSIS — S92351A Displaced fracture of fifth metatarsal bone, right foot, initial encounter for closed fracture: Secondary | ICD-10-CM

## 2012-11-24 DIAGNOSIS — I252 Old myocardial infarction: Secondary | ICD-10-CM | POA: Insufficient documentation

## 2012-11-24 DIAGNOSIS — Y9289 Other specified places as the place of occurrence of the external cause: Secondary | ICD-10-CM | POA: Insufficient documentation

## 2012-11-24 DIAGNOSIS — E559 Vitamin D deficiency, unspecified: Secondary | ICD-10-CM | POA: Insufficient documentation

## 2012-11-24 DIAGNOSIS — Z87448 Personal history of other diseases of urinary system: Secondary | ICD-10-CM | POA: Insufficient documentation

## 2012-11-24 DIAGNOSIS — G8929 Other chronic pain: Secondary | ICD-10-CM | POA: Insufficient documentation

## 2012-11-24 DIAGNOSIS — S91109A Unspecified open wound of unspecified toe(s) without damage to nail, initial encounter: Secondary | ICD-10-CM | POA: Insufficient documentation

## 2012-11-24 DIAGNOSIS — IMO0002 Reserved for concepts with insufficient information to code with codable children: Secondary | ICD-10-CM | POA: Insufficient documentation

## 2012-11-24 DIAGNOSIS — Z872 Personal history of diseases of the skin and subcutaneous tissue: Secondary | ICD-10-CM | POA: Insufficient documentation

## 2012-11-24 DIAGNOSIS — I2510 Atherosclerotic heart disease of native coronary artery without angina pectoris: Secondary | ICD-10-CM | POA: Insufficient documentation

## 2012-11-24 DIAGNOSIS — S91119A Laceration without foreign body of unspecified toe without damage to nail, initial encounter: Secondary | ICD-10-CM

## 2012-11-24 DIAGNOSIS — M129 Arthropathy, unspecified: Secondary | ICD-10-CM | POA: Insufficient documentation

## 2012-11-24 DIAGNOSIS — J449 Chronic obstructive pulmonary disease, unspecified: Secondary | ICD-10-CM | POA: Insufficient documentation

## 2012-11-24 DIAGNOSIS — Z8659 Personal history of other mental and behavioral disorders: Secondary | ICD-10-CM | POA: Insufficient documentation

## 2012-11-24 DIAGNOSIS — G4733 Obstructive sleep apnea (adult) (pediatric): Secondary | ICD-10-CM | POA: Insufficient documentation

## 2012-11-24 DIAGNOSIS — E0590 Thyrotoxicosis, unspecified without thyrotoxic crisis or storm: Secondary | ICD-10-CM | POA: Insufficient documentation

## 2012-11-24 DIAGNOSIS — J4489 Other specified chronic obstructive pulmonary disease: Secondary | ICD-10-CM | POA: Insufficient documentation

## 2012-11-24 MED ORDER — OXYCODONE-ACETAMINOPHEN 5-325 MG PO TABS
1.0000 | ORAL_TABLET | Freq: Once | ORAL | Status: AC
  Administered 2012-11-24: 1 via ORAL
  Filled 2012-11-24: qty 1

## 2012-11-24 MED ORDER — OXYCODONE-ACETAMINOPHEN 5-325 MG PO TABS: 1.0000 | ORAL_TABLET | ORAL | Status: DC | PRN

## 2012-11-24 NOTE — ED Notes (Signed)
The patient states that she tripped and fell injuring her right ankle and right foot.  States that the area is painful, however denies the need for pain medication at this point.

## 2012-11-24 NOTE — ED Notes (Signed)
Pt lost balance on her steps and fell, injured right ankle.  Denies any other injury.  Arrived form home by ems.

## 2012-11-24 NOTE — ED Provider Notes (Signed)
History    CSN: [id number] Arrival date & time 11/24/12  1105  First MD Initiated Contact with Patient 11/24/12 1116     Chief Complaint  Patient presents with  . Fall   (Consider location/radiation/quality/duration/timing/severity/associated sxs/prior Treatment) Patient is a 57 y.o. female presenting with foot injury. The history is provided by the patient.  Foot Injury Location:  Foot Time since incident:  1 hour Injury: yes   Mechanism of injury: fall   Mechanism of injury comment:  Slipped and fell down two steps.   Fall:    Fall occurred:  Down stairs   Impact surface:  Psychiatric nurse of impact:  Feet   Entrapped after fall: no   Foot location:  R foot Pain details:    Quality:  Aching   Radiates to:  Does not radiate   Severity:  Moderate   Onset quality:  Sudden   Timing:  Constant   Progression:  Unchanged Chronicity:  New Dislocation: no   Foreign body present:  No foreign bodies Tetanus status:  Out of date Prior injury to area:  No Relieved by:  Nothing Worsened by:  Activity and bearing weight Ineffective treatments:  None tried Associated symptoms: no back pain, no decreased ROM, no fever, no muscle weakness, no neck pain, no numbness, no stiffness, no swelling and no tingling    Past Medical History  Diagnosis Date  . Hyperlipidemia   . CAD (coronary artery disease)   . Bipolar 1 disorder   . GERD (gastroesophageal reflux disease)   . Hyperthyroidism   . Chronic back pain   . Dyspnea     PFT 03/05/09 FEV1 2.77(98%), FVC 3.25(86%), FEV1% 85, TLC 5.88(99%), DLCO 60% ,  Methacholine challenge 03/16/09 normal ,  CT chest 03/12/09 no pulmonary disease  . Anxiety   . Arthritis   . Depression   . OSA on CPAP     2 liters  . HTN (hypertension)   . History of colonoscopy 10/17/2002    by Dr Rehman-> distal non-specific proctitis, small ext hemorrhoids,   . Fungal infection   . Cellulitis   . Contusion of sacrum   . Migraine headache   . Vitamin D  deficiency   . Sleep apnea   . Myocardial infarction     NOV 1997  . Hypothyroidism     States she only has hyperthyroidism  . Complication of anesthesia     States she typically gets sick s/p anesthesia  . Morbid obesity with body mass index of 50.0-59.9 in adult JAN 2011 370 LBS    2004 311 BMI 45.9  . Obsessive-compulsive disorder   . PTSD (post-traumatic stress disorder)   . Asthma   . Allergy   . Urine incontinence   . Chronic headaches   . Suicidal ideation   . COPD (chronic obstructive pulmonary disease)   . Schizoaffective disorder, bipolar type    Past Surgical History  Procedure Laterality Date  . Cholecystectomy    . Knee arthroscopy    . Appendectomy    . Tonsillectomy    . Back surgery  2008  . Total vaginal hysterectomy    . Abdominal hysterectomy      sept 1996  . Tubal ligation    . Colonoscopy  10/17/2002     Distal proctitis, small external hemorrhoids, otherwise/  normal colonoscopy. Suspect rectal bleeding secondary to hemorrhoids  . Esophagogastroduodenoscopy  03/18/09    fundic gland polyps/mild gastritis  . Cardiac catheterization  nov 1997  . Joint replacement      bil knee replacement  . Hernia repair  1978   Family History  Problem Relation Age of Onset  . Cancer Mother   . Hypertension Mother   . Bipolar disorder Mother   . Dementia Mother   . Depression Mother   . Coronary artery disease Father   . Alcohol abuse Father   . Hypertension Brother   . Coronary artery disease Brother   . Bipolar disorder Brother   . Depression Brother   . Anesthesia problems Neg Hx   . Hypotension Neg Hx   . Malignant hyperthermia Neg Hx   . Pseudochol deficiency Neg Hx   . Depression Sister   . Paranoid behavior Sister   . Bipolar disorder Sister   . Depression Sister   . Hypertension Sister   . Cancer Son     thyroid   History  Substance Use Topics  . Smoking status: Passive Smoke Exposure - Never Smoker  . Smokeless tobacco: Never Used   . Alcohol Use: No   OB History   Grav Para Term Preterm Abortions TAB SAB Ect Mult Living                 Review of Systems  Constitutional: Negative for fever and chills.  HENT: Negative for neck pain.   Respiratory: Negative for chest tightness, shortness of breath and wheezing.   Cardiovascular: Negative for chest pain.  Genitourinary: Negative for dysuria and difficulty urinating.  Musculoskeletal: Positive for joint swelling and arthralgias. Negative for back pain and stiffness.  Skin: Negative for color change and wound.  Neurological: Negative for dizziness, syncope, weakness, numbness and headaches.  All other systems reviewed and are negative.    Allergies  Haldol; Ativan; and Naproxen  Home Medications   Current Outpatient Rx  Name  Route  Sig  Dispense  Refill  . albuterol (PROVENTIL HFA;VENTOLIN HFA) 108 (90 BASE) MCG/ACT inhaler   Inhalation   Inhale 2 puffs into the lungs every 6 (six) hours as needed for wheezing or shortness of breath.         Marland Kitchen amitriptyline (ELAVIL) 50 MG tablet   Oral   Take 1 tablet (50 mg total) by mouth at bedtime.   30 tablet   2   . aspirin 81 MG chewable tablet   Oral   Chew 1 tablet (81 mg total) by mouth daily. For blood thinner.   30 tablet   0   . atorvastatin (LIPITOR) 20 MG tablet   Oral   Take 20 mg by mouth at bedtime.         Marland Kitchen buPROPion (WELLBUTRIN SR) 200 MG 12 hr tablet   Oral   Take 1 tablet (200 mg total) by mouth every morning. For depression   30 tablet   2   . cetirizine (ZYRTEC) 10 MG tablet   Oral   Take 10 mg by mouth every morning.         . Cholecalciferol 2000 UNITS TABS   Oral   Take 1 tablet (2,000 Units total) by mouth daily with breakfast. Nutritional supplement.   30 each      . divalproex (DEPAKOTE ER) 500 MG 24 hr tablet   Oral   Take 3 tablets (1,500 mg total) by mouth at bedtime. On occasions may take one during the day for extreme stress.         . donepezil  (ARICEPT) 10 MG tablet  Oral   Take 1 tablet (10 mg total) by mouth daily with breakfast.   30 tablet   2   . DULoxetine (CYMBALTA) 60 MG capsule   Oral   Take 1 capsule (60 mg total) by mouth 2 (two) times daily.   60 capsule   3   . fluticasone (FLONASE) 50 MCG/ACT nasal spray   Nasal   Place 1 spray into the nose 2 (two) times daily.   16 g   6   . furosemide (LASIX) 20 MG tablet   Oral   Take 1.5 tablets (30 mg total) by mouth daily. For blood pressure control.   45 tablet   0   . gabapentin (NEURONTIN) 100 MG capsule   Oral   Take 2 capsules (200 mg total) by mouth 3 (three) times daily. For pain and mood control   180 capsule   2   . levothyroxine (SYNTHROID, LEVOTHROID) 125 MCG tablet   Oral   Take 1 tablet (125 mcg total) by mouth daily before breakfast. For hypothyroidism.         . Magnesium 200 MG TABS   Oral   Take 1 tablet by mouth daily.         . Multiple Vitamin (MULTIVITAMIN WITH MINERALS) TABS   Oral   Take 1 tablet by mouth daily. For nutritional supplementation.   30 tablet   0   . niacin (NIASPAN) 500 MG CR tablet   Oral   Take 500 mg by mouth at bedtime.         Marland Kitchen nystatin (MYCOSTATIN/NYSTOP) 100000 UNIT/GM POWD   Topical   Apply 1 g topically 3 (three) times daily as needed (redness and moisture).      0   . omeprazole (PRILOSEC) 20 MG capsule   Oral   Take 1 capsule (20 mg total) by mouth daily.   30 capsule   6   . polyethylene glycol (MIRALAX / GLYCOLAX) packet   Oral   Take 17 g by mouth daily.   14 each   0   . sennosides-docusate sodium (SENOKOT-S) 8.6-50 MG tablet   Oral   Take 1 tablet by mouth daily.         . traZODone (DESYREL) 100 MG tablet   Oral   Take 1.5-2 tablets (150-200 mg total) by mouth at bedtime as needed for depression (insomnia).   60 tablet   2   . verapamil (CALAN-SR) 180 MG CR tablet   Oral   Take 180 mg by mouth every morning.          BP 137/85  Pulse 86  Temp(Src) 98.3  F (36.8 C) (Oral)  Resp 22  Ht 5\' 9"  (1.753 m)  Wt 371 lb (168.284 kg)  BMI 54.76 kg/m2  SpO2 100%  LMP 02/18/1995 Physical Exam  Nursing note and vitals reviewed. Constitutional: She is oriented to person, place, and time. She appears well-developed and well-nourished. No distress.  HENT:  Head: Normocephalic and atraumatic.  Cardiovascular: Normal rate, regular rhythm, normal heart sounds and intact distal pulses.   Pulmonary/Chest: Effort normal and breath sounds normal. No respiratory distress.  Musculoskeletal: She exhibits tenderness.  ttp of the lateral right foot, mild STS is present.  ROM is preserved.  DP pulse is brisk, distal sensation intact. Small superificial laceration to plantar surface of right fifth toe.  Bleeding controlled.   No erythema or deformity.  No proximal tenderness  Neurological: She is alert and oriented to  person, place, and time. She exhibits normal muscle tone. Coordination normal.  Skin: Skin is warm and dry.    ED Course  Procedures (including critical care time) Labs Reviewed - No data to display Dg Ankle Complete Right  11/24/2012   *RADIOLOGY REPORT*  Clinical Data: Fall.  RIGHT ANKLE - COMPLETE 3+ VIEW  Comparison: Report from 02/08/2000  Findings: Jones fracture of the proximal fifth metatarsal noted. Small plantar calcaneal spurs are observed.  On the frontal projection of there is a suggestion of a lateral avulsion from the anterior calcaneus.  IMPRESSION:  1.  Nondisplaced Jones fracture, proximal fifth metatarsal. 2.  Suspected avulsion of the extensor digitorum brevis attachment from the calcaneus.   Original Report Authenticated By: Gaylyn Rong, M.D.     MDM     Cam walker applied, laceration to the right fifth toe was cleaned , bandaged and toes were buddy taped for support.  Wound is < 1 cm and edges are well approximated.  Remains NV intact.  Pt agrees to elevate, ice and close f/u with orthopedics.    Tula Schryver L. Trisha Mangle,  PA-C 11/25/12 2237

## 2012-11-25 ENCOUNTER — Other Ambulatory Visit (HOSPITAL_COMMUNITY): Payer: Self-pay | Admitting: Family Medicine

## 2012-11-25 NOTE — ED Provider Notes (Signed)
Medical screening examination/treatment/procedure(s) were performed by non-physician practitioner and as supervising physician I was immediately available for consultation/collaboration.   Benny Lennert, MD 11/25/12 [phone]

## 2012-11-26 ENCOUNTER — Telehealth: Payer: Self-pay | Admitting: Nurse Practitioner

## 2012-11-26 DIAGNOSIS — S92909S Unspecified fracture of unspecified foot, sequela: Secondary | ICD-10-CM

## 2012-11-26 NOTE — Telephone Encounter (Signed)
Patient fell Sunday and fractured her foot. She is unable to stand unassisted, she just falls down when she tries to stand. Patient already has home health coming in for PT but she needs a referral for an evualation. Patient will need assessment as to what home health can do to help her with daily activities.

## 2012-11-26 NOTE — Telephone Encounter (Signed)
Left message for patient to return call.

## 2012-11-26 NOTE — Telephone Encounter (Signed)
Pt request PT referral for home PT for decreased mobility secondary to fractured foot.

## 2012-11-26 NOTE — Telephone Encounter (Signed)
Please advise 

## 2012-11-26 NOTE — Telephone Encounter (Signed)
Last lipid 11/29/11    DWM

## 2012-11-27 ENCOUNTER — Telehealth: Payer: Self-pay | Admitting: *Deleted

## 2012-11-27 DIAGNOSIS — R269 Unspecified abnormalities of gait and mobility: Secondary | ICD-10-CM

## 2012-11-27 NOTE — Telephone Encounter (Signed)
[facility] called after they received referral fax from [name]. Laney Potash the PT coordinator stated that they have already discharged patient on Monday 11/25/12. Please advise?

## 2012-11-27 NOTE — Telephone Encounter (Signed)
Patient's treatment started 11/13/12 and ended 11/25/12.

## 2012-11-27 NOTE — Telephone Encounter (Addendum)
  Pt fell 5 days ago and fractured her R foot. She was evaluated and treated in ED. She is wearing an Print production planner, but having great difficulty bearing weight on the r foot due to pain. Her situation is complicated by morbid obesity. This is causing difficulty with transfers and gait. Additionally, she fell in her home, 4 days ago, after the incident while attempting to get out of bed. I am concerned that she is at high risk for falling and will fall again. She will benefit from bariatric sized wheelchair use for several weeks-4-6 weeks, while her foot heals. Wheelchair use will decrease her risk for additional falls. I also recommend a bariatric sized bedside commode. She will follow up with orthopedist tomorrow.

## 2012-11-27 NOTE — Telephone Encounter (Deleted)
Pt fell 5 days ago and fractured her R foot. She was evaluated and treated in ED. She is wearing an Print production planner, but having great difficulty bearing weight on the r foot due to pain. Her situation is complicated by morbid obesity. Additionally, she fell in her home, 4 days ago, after the incident while attempting to get out of bed. I am concerned that she is at high risk for falling and will fall again. She will benefit from bariatric sized wheelchair use for several weeks-4-6 weeks, while her foot heals. Wheelchair use will decrease her risk for additional falls.

## 2012-12-02 ENCOUNTER — Ambulatory Visit (HOSPITAL_COMMUNITY): Payer: Self-pay | Admitting: Psychiatry

## 2012-12-02 ENCOUNTER — Institutional Professional Consult (permissible substitution): Payer: Self-pay | Admitting: Pulmonary Disease

## 2012-12-03 ENCOUNTER — Ambulatory Visit (HOSPITAL_COMMUNITY): Payer: No Typology Code available for payment source | Admitting: Psychiatry

## 2012-12-03 ENCOUNTER — Encounter (HOSPITAL_COMMUNITY): Payer: Self-pay | Admitting: Psychiatry

## 2012-12-04 ENCOUNTER — Other Ambulatory Visit: Payer: Self-pay | Admitting: Family Medicine

## 2012-12-04 ENCOUNTER — Other Ambulatory Visit (HOSPITAL_COMMUNITY): Payer: Self-pay | Admitting: Family Medicine

## 2012-12-06 NOTE — Telephone Encounter (Signed)
Last seen 12/26/11  Last lipids 11/29/11   [facility]

## 2012-12-06 NOTE — Telephone Encounter (Signed)
Last seen 11/2011   [facility]

## 2012-12-09 ENCOUNTER — Ambulatory Visit: Payer: Self-pay | Admitting: Neurology

## 2012-12-11 ENCOUNTER — Encounter: Payer: Self-pay | Admitting: Physical Medicine & Rehabilitation

## 2012-12-16 ENCOUNTER — Ambulatory Visit (HOSPITAL_COMMUNITY): Payer: Self-pay | Admitting: Psychiatry

## 2012-12-24 ENCOUNTER — Ambulatory Visit: Payer: Self-pay | Admitting: Internal Medicine

## 2012-12-26 ENCOUNTER — Other Ambulatory Visit (HOSPITAL_COMMUNITY): Payer: Self-pay | Admitting: *Deleted

## 2012-12-26 ENCOUNTER — Other Ambulatory Visit (HOSPITAL_COMMUNITY): Payer: Self-pay | Admitting: Psychiatry

## 2012-12-26 DIAGNOSIS — F259 Schizoaffective disorder, unspecified: Secondary | ICD-10-CM

## 2012-12-26 DIAGNOSIS — F4310 Post-traumatic stress disorder, unspecified: Secondary | ICD-10-CM

## 2012-12-26 NOTE — Telephone Encounter (Signed)
Elavil refilled through escripts. Called pharmacy to confirm they had no refills on file. No refills are on file and last fill was in June. Upon reviewing chart it appeared a prescription was sent June with 2 refills but while reviewing with Dr. Lucianne Muss it never went through electronically but stated "no print". Dr. Lucianne Muss gave approval for refill through escripts.

## 2012-12-30 ENCOUNTER — Encounter (HOSPITAL_COMMUNITY): Payer: Self-pay | Admitting: Psychiatry

## 2012-12-30 ENCOUNTER — Ambulatory Visit (HOSPITAL_COMMUNITY): Payer: Self-pay | Admitting: Psychiatry

## 2012-12-30 NOTE — Progress Notes (Unsigned)
Outpatient Therapist Discharge Summary   Allison Sharp    Apr 07, 1956   Admission Date: ***    Discharge Date:  12/30/2012  Reason for Discharge:  Patient left phone message indicating she decided to pursue services from another practice.   Diagnosis:  Axis I:  No diagnosis found.  Axis II:  ***  Axis III:  ***  Axis IV:  ***  Axis V:  ***  Comments:    [name]

## 2012-12-31 ENCOUNTER — Other Ambulatory Visit: Payer: Self-pay | Admitting: *Deleted

## 2012-12-31 DIAGNOSIS — I10 Essential (primary) hypertension: Secondary | ICD-10-CM

## 2012-12-31 MED ORDER — FUROSEMIDE 20 MG PO TABS: 30.0000 mg | ORAL_TABLET | Freq: Every day | ORAL | Status: DC

## 2012-12-31 NOTE — Telephone Encounter (Signed)
Patient last seen in office on 08-15-12. Was to follow up in 1 week. Please advise

## 2013-01-06 ENCOUNTER — Other Ambulatory Visit (HOSPITAL_COMMUNITY): Payer: Self-pay | Admitting: Family Medicine

## 2013-01-06 ENCOUNTER — Other Ambulatory Visit: Payer: Self-pay | Admitting: Family Medicine

## 2013-01-09 ENCOUNTER — Other Ambulatory Visit: Payer: Self-pay | Admitting: *Deleted

## 2013-01-09 DIAGNOSIS — I10 Essential (primary) hypertension: Secondary | ICD-10-CM

## 2013-01-09 MED ORDER — FUROSEMIDE 20 MG PO TABS: 30.0000 mg | ORAL_TABLET | Freq: Every day | ORAL | Status: DC

## 2013-01-09 NOTE — Telephone Encounter (Signed)
Please advise refill? 

## 2013-01-10 ENCOUNTER — Ambulatory Visit (HOSPITAL_COMMUNITY): Payer: Self-pay | Admitting: Psychiatry

## 2013-01-13 ENCOUNTER — Encounter: Payer: Medicare Other | Attending: Physical Medicine & Rehabilitation | Admitting: Physical Medicine & Rehabilitation

## 2013-01-14 ENCOUNTER — Ambulatory Visit (HOSPITAL_COMMUNITY): Payer: Self-pay | Admitting: Psychiatry

## 2013-01-21 ENCOUNTER — Telehealth: Payer: Self-pay | Admitting: Nurse Practitioner

## 2013-01-21 NOTE — Telephone Encounter (Signed)
Patient Information:  Caller Name: Allison Sharp  Phone: [phone]  Patient: Allison, Sharp  Gender: Female  DOB: 1956-02-19  Age: 57 Years  PCP: Allison Sharp  Office Follow Up:  Does the office need to follow up with this patient?: No  Instructions For The Office: N/A  RN Note:  informed Allison Sharp that if the Monistat did not help, she really needed to be evaluated and confirm a yeast/bacterial infection; said she can't be seen before next Wed; advised an earlier appt or even using [facility] one evening  Symptoms  Reason For Call & Symptoms: sxs started a wk ago; having yellowish d/c and vaginal irritation; c/o foul odor to the d/c; used Monistat and using personal wipes; denies UTI sxs  Reviewed Health History In EMR: Yes  Reviewed Medications In EMR: Yes  Reviewed Allergies In EMR: Yes  Reviewed Surgeries / Procedures: Yes  Date of Onset of Symptoms: 01/14/2013  Treatments Tried: Monistat for 7 days  Treatments Tried Worked: No  Guideline(s) Used:  Vaginal Discharge  Disposition Per Guideline:   See Within 3 Days in Office  Reason For Disposition Reached:   Bad smelling vaginal discharge  Advice Given:  N/A  Patient Will Follow Care Advice:  YES

## 2013-01-21 NOTE — Telephone Encounter (Signed)
Just an FYI

## 2013-01-23 ENCOUNTER — Other Ambulatory Visit (HOSPITAL_COMMUNITY): Payer: Self-pay | Admitting: Psychiatry

## 2013-01-29 ENCOUNTER — Ambulatory Visit: Payer: Medicare Other | Admitting: Nurse Practitioner

## 2013-04-14 ENCOUNTER — Ambulatory Visit: Payer: Self-pay | Admitting: Nurse Practitioner

## 2013-05-28 NOTE — Telephone Encounter (Signed)
Close Encounter 

## 2013-07-01 ENCOUNTER — Encounter: Payer: Self-pay | Admitting: Nurse Practitioner

## 2013-07-01 ENCOUNTER — Ambulatory Visit (INDEPENDENT_AMBULATORY_CARE_PROVIDER_SITE_OTHER): Payer: Medicare Other | Admitting: Nurse Practitioner

## 2013-07-01 ENCOUNTER — Other Ambulatory Visit (HOSPITAL_COMMUNITY)
Admission: RE | Admit: 2013-07-01 | Discharge: 2013-07-01 | Disposition: A | Discharge status: Home / Self Care | Payer: Medicare Other | Source: Ambulatory Visit | Attending: Nurse Practitioner | Admitting: Nurse Practitioner

## 2013-07-01 VITALS — BP 138/90 | HR 118 | Temp 97.6°F | Ht 69.0 in | Wt 351.0 lb

## 2013-07-01 DIAGNOSIS — R350 Frequency of micturition: Secondary | ICD-10-CM

## 2013-07-01 DIAGNOSIS — L538 Other specified erythematous conditions: Secondary | ICD-10-CM

## 2013-07-01 DIAGNOSIS — E785 Hyperlipidemia, unspecified: Secondary | ICD-10-CM

## 2013-07-01 DIAGNOSIS — N760 Acute vaginitis: Secondary | ICD-10-CM | POA: Insufficient documentation

## 2013-07-01 DIAGNOSIS — Z124 Encounter for screening for malignant neoplasm of cervix: Secondary | ICD-10-CM | POA: Insufficient documentation

## 2013-07-01 DIAGNOSIS — L304 Erythema intertrigo: Secondary | ICD-10-CM

## 2013-07-01 DIAGNOSIS — K5909 Other constipation: Secondary | ICD-10-CM

## 2013-07-01 DIAGNOSIS — K219 Gastro-esophageal reflux disease without esophagitis: Secondary | ICD-10-CM

## 2013-07-01 DIAGNOSIS — E039 Hypothyroidism, unspecified: Secondary | ICD-10-CM

## 2013-07-01 DIAGNOSIS — I10 Essential (primary) hypertension: Secondary | ICD-10-CM

## 2013-07-01 DIAGNOSIS — K921 Melena: Secondary | ICD-10-CM

## 2013-07-01 DIAGNOSIS — Z131 Encounter for screening for diabetes mellitus: Secondary | ICD-10-CM

## 2013-07-01 DIAGNOSIS — Z1151 Encounter for screening for human papillomavirus (HPV): Secondary | ICD-10-CM | POA: Insufficient documentation

## 2013-07-01 DIAGNOSIS — Z1239 Encounter for other screening for malignant neoplasm of breast: Secondary | ICD-10-CM

## 2013-07-01 DIAGNOSIS — E559 Vitamin D deficiency, unspecified: Secondary | ICD-10-CM

## 2013-07-01 DIAGNOSIS — N898 Other specified noninflammatory disorders of vagina: Secondary | ICD-10-CM

## 2013-07-01 DIAGNOSIS — Z113 Encounter for screening for infections with a predominantly sexual mode of transmission: Secondary | ICD-10-CM | POA: Insufficient documentation

## 2013-07-01 DIAGNOSIS — K146 Glossodynia: Secondary | ICD-10-CM

## 2013-07-01 LAB — URINALYSIS, ROUTINE W REFLEX MICROSCOPIC
BILIRUBIN URINE: NEGATIVE
HGB URINE DIPSTICK: NEGATIVE
NITRITE: NEGATIVE
PH: 6 (ref 5.0–8.0)
Specific Gravity, Urine: 1.02 (ref 1.000–1.030)
URINE GLUCOSE: NEGATIVE
Urobilinogen, UA: 1 (ref 0.0–1.0)

## 2013-07-01 LAB — CBC
HEMATOCRIT: 47.8 % — AB (ref 36.0–46.0)
Hemoglobin: 15.6 g/dL — ABNORMAL HIGH (ref 12.0–15.0)
MCHC: 32.7 g/dL (ref 30.0–36.0)
MCV: 94.9 fl (ref 78.0–100.0)
Platelets: 152 10*3/uL (ref 150.0–400.0)
RBC: 5.03 Mil/uL (ref 3.87–5.11)
RDW: 13.8 % (ref 11.5–14.6)
WBC: 9.4 10*3/uL (ref 4.5–10.5)

## 2013-07-01 LAB — COMPREHENSIVE METABOLIC PANEL
ALBUMIN: 4.3 g/dL (ref 3.5–5.2)
ALT: 10 U/L (ref 0–35)
AST: 17 U/L (ref 0–37)
Alkaline Phosphatase: 72 U/L (ref 39–117)
BUN: 11 mg/dL (ref 6–23)
CALCIUM: 9.4 mg/dL (ref 8.4–10.5)
CHLORIDE: 103 meq/L (ref 96–112)
CO2: 25 meq/L (ref 19–32)
CREATININE: 1 mg/dL (ref 0.4–1.2)
GFR: 60.94 mL/min (ref >60.00)
GLUCOSE: 75 mg/dL (ref 70–99)
POTASSIUM: 3.4 meq/L — AB (ref 3.5–5.1)
Sodium: 138 mEq/L (ref 135–145)
Total Bilirubin: 0.6 mg/dL (ref 0.3–1.2)
Total Protein: 7.8 g/dL (ref 6.0–8.3)

## 2013-07-01 LAB — TSH: TSH: 0.88 u[IU]/mL (ref 0.35–5.50)

## 2013-07-01 LAB — POCT URINALYSIS DIPSTICK
Blood: NEGATIVE
Glucose: NEGATIVE
Nitrite: NEGATIVE
PH: 6
PROTEIN: 30
SPECIFIC GRAVITY: 1.025
UUROB: 2

## 2013-07-01 LAB — HEMOGLOBIN A1C: Hgb A1c MFr Bld: 5.3 % (ref 4.6–6.5)

## 2013-07-01 LAB — LIPID PANEL
CHOLESTEROL: 138 mg/dL (ref 0–200)
HDL: 44.5 mg/dL (ref >39.00)
LDL Cholesterol: 64 mg/dL (ref 0–99)
TRIGLYCERIDES: 149 mg/dL (ref 0.0–149.0)
Total CHOL/HDL Ratio: 3
VLDL: 29.8 mg/dL (ref 0.0–40.0)

## 2013-07-01 LAB — VITAMIN B12: Vitamin B-12: 425 pg/mL (ref 211–911)

## 2013-07-01 LAB — H. PYLORI ANTIBODY, IGG: H Pylori IgG: NEGATIVE

## 2013-07-01 MED ORDER — MICONAZOLE NITRATE 2 % EX OINT: TOPICAL_OINTMENT | CUTANEOUS | Status: DC

## 2013-07-01 NOTE — Assessment & Plan Note (Signed)
Start probiotic: alternate Culterelle & Align daily. Eat fresh fruit daily (pear). Use citrucel daily. 4-5 16 oz water daily.

## 2013-07-01 NOTE — Assessment & Plan Note (Signed)
Continue meds at current dose. Check TSH today.

## 2013-07-01 NOTE — Assessment & Plan Note (Signed)
Recent 20 lb weight loss. Exercise goal: 10-15 minute walk 2-3 times daily. Calorie goal 2000 cals/day. Increase proteins with nuts, seeds, & eggs. Follow up with [facility] for on-going treatment & support.

## 2013-07-01 NOTE — Assessment & Plan Note (Signed)
CMET today. Fair control: 138/90, not taken meds today. No pitting edema. Mild LE swelling. Continue verapamil.

## 2013-07-01 NOTE — Patient Instructions (Addendum)
Our office will call you with lab results. For reflux, start probiotcs daily. Begin with Culterelle & take 1 T daily for 2 weeks. Than Add Align and alternate each daily. This will help with constipation and reflux. For constipation: take citrucel daily, drink 4 to 5 16 oz bottles water daily. Eat something fresh every day-pears work well for constipation. For nasal stuffiness, start Neilmed sinus rinses daily. Continue with diet & exercise for ongoing weight loss. Get back to [facility] if possible. In the meantime, try to walk for 10 - 15 minutes, 2 -3 times daily. Calorie goal is 2000 calories daily. Continue to increase protein with eggs, nuts, and seeds. For intertrigo, keep skin as dry as possible-change bra if needed or keep dry tissue in place. Use cream twice daily. Wash with mild soap at least once daily [facility]). Once redness clears, start using A&D zinc daily. For Scar under L breast, cover w/band-aid, mole skin, or bunion pads to prevent chaffing. Great to see you!  Intertrigo Intertrigo is a skin condition that occurs in between folds of skin in places on the body that rub together a lot and do not get much ventilation. It is caused by heat, moisture, friction, sweat retention, and lack of air circulation, which produces red, irritated patches and, sometimes, scaling or drainage. People who have diabetes, who are obese, or who have treatment with antibiotics are at increased risk for intertrigo. The most common sites for intertrigo to occur include:  The groin.  The breasts.  The armpits.  Folds of abdominal skin.  Webbed spaces between the fingers or toes. Intertrigo may be aggravated by:  Sweat.  Feces.  Yeast or bacteria that are present near skin folds.  Urine.  Vaginal discharge. HOME CARE INSTRUCTIONS  The following steps can be taken to reduce friction and keep the affected area cool and dry:  Expose skin folds to the air.  Keep deep skin folds separated with cotton or  linen cloth. Avoid tight fitting clothing that could cause chafing.  Wear open-toed shoes or sandals to help reduce moisture between the toes.  Apply absorbent powders to affected areas as directed by your caregiver.  Apply over-the-counter barrier pastes, such as zinc oxide, as directed by your caregiver.  If you develop a fungal infection in the affected area, your caregiver may have you use antifungal creams. SEEK MEDICAL CARE IF:   The rash is not improving after 1 week of treatment.  The rash is getting worse (more red, more swollen, more painful, or spreading).  You have a fever or chills. MAKE SURE YOU:   Understand these instructions.  Will watch your condition.  Will get help right away if you are not doing well or get worse. Document Released: 05/15/2005 Document Revised: 08/07/2011 Document Reviewed: 10/28/2009 [facility] Patient Information 2014 [place].  Constipation, Adult Constipation is when a person has fewer than 3 bowel movements a week; has difficulty having a bowel movement; or has stools that are dry, hard, or larger than normal. As people grow older, constipation is more common. If you try to fix constipation with medicines that make you have a bowel movement (laxatives), the problem may get worse. Long-term laxative use may cause the muscles of the colon to become weak. A low-fiber diet, not taking in enough fluids, and taking certain medicines may make constipation worse. CAUSES   Certain medicines, such as antidepressants, pain medicine, iron supplements, antacids, and water pills.   Certain diseases, such as diabetes, irritable  bowel syndrome (IBS), thyroid disease, or depression.   Not drinking enough water.   Not eating enough fiber-rich foods.   Stress or travel.  Lack of physical activity or exercise.  Not going to the restroom when there is the urge to have a bowel movement.  Ignoring the urge to have a bowel movement.  Using  laxatives too much. SYMPTOMS   Having fewer than 3 bowel movements a week.   Straining to have a bowel movement.   Having hard, dry, or larger than normal stools.   Feeling full or bloated.   Pain in the lower abdomen.  Not feeling relief after having a bowel movement. DIAGNOSIS  Your caregiver will take a medical history and perform a physical exam. Further testing may be done for severe constipation. Some tests may include:   A barium enema X-ray to examine your rectum, colon, and sometimes, your small intestine.  A sigmoidoscopy to examine your lower colon.  A colonoscopy to examine your entire colon. TREATMENT  Treatment will depend on the severity of your constipation and what is causing it. Some dietary treatments include drinking more fluids and eating more fiber-rich foods. Lifestyle treatments may include regular exercise. If these diet and lifestyle recommendations do not help, your caregiver may recommend taking over-the-counter laxative medicines to help you have bowel movements. Prescription medicines may be prescribed if over-the-counter medicines do not work.  HOME CARE INSTRUCTIONS   Increase dietary fiber in your diet, such as fruits, vegetables, whole grains, and beans. Limit high-fat and processed sugars in your diet, such as [place] fries, hamburgers, cookies, candies, and soda.   A fiber supplement may be added to your diet if you cannot get enough fiber from foods.   Drink enough fluids to keep your urine clear or pale yellow.   Exercise regularly or as directed by your caregiver.   Go to the restroom when you have the urge to go. Do not hold it.  Only take medicines as directed by your caregiver. Do not take other medicines for constipation without talking to your caregiver first. [place] IF:   You have bright red blood in your stool.   Your constipation lasts for more than 4 days or gets worse.   You have abdominal or  rectal pain.   You have thin, pencil-like stools.  You have unexplained weight loss. MAKE SURE YOU:   Understand these instructions.  Will watch your condition.  Will get help right away if you are not doing well or get worse. Document Released: 02/11/2004 Document Revised: 08/07/2011 Document Reviewed: 02/24/2013 [facility] Patient Information 2014 [place], [place].

## 2013-07-01 NOTE — Assessment & Plan Note (Signed)
Stop nystatin powder. Start miconozole cream. Skin care instructions given.

## 2013-07-01 NOTE — Progress Notes (Signed)
Pre-visit discussion using our clinic review tool. No additional management support is needed unless otherwise documented below in the visit note.  

## 2013-07-01 NOTE — Assessment & Plan Note (Signed)
Continue meds. Consider stopping niacin. Check lipids today.

## 2013-07-01 NOTE — Assessment & Plan Note (Signed)
Check vit D level today   

## 2013-07-01 NOTE — Progress Notes (Signed)
Subjective:     Allison Sharp is a 58 y.o. female. She returns for follow up of HTN, reflux, hyperlipidemia, and thyroid disease after 7 mos. Since I last saw Allison Sharp, she remarried, moved to [facility]; divorced and returned to [facility]. She lives with her sister, Allison Sharp who is a heavy smoker. Allison Sharp is concerned that her asthma will flare and has recently experienced nasal stuffiness.   She intentionally lost 20 pounds and is no longer using a walker or a handicap placard. She is motivated to continue with weight loss.   She complains of: chronic knee pain with R worse than L. She had bilateral knee replacements in the past; water brash, occuring during day and night; constipation unrelieved by miralax; urinary frequency described as 10 urinations in a day and stress incontinence; recurrent sores in mouth & nose; vaginal discharge; bright red blood & clots in toilet.   The following portions of the patient's history were reviewed and updated as appropriate: allergies, current medications, past medical history, past social history, past surgical history and problem list.  Review of Systems Pertinent items are noted in HPI.    Objective:    BP 138/90  Pulse 118  Temp(Src) 97.6 F (36.4 C) (Temporal)  Ht 5\' 9"  (1.753 m)  Wt 351 lb (159.213 kg)  BMI 51.81 kg/m2  SpO2 97%  LMP 02/18/1995 BP 138/90  Pulse 118  Temp(Src) 97.6 F (36.4 C) (Temporal)  Ht 5\' 9"  (1.753 m)  Wt 351 lb (159.213 kg)  BMI 51.81 kg/m2  SpO2 97%  LMP 02/18/1995 General appearance: alert, cooperative, appears stated age and no distress Head: Normocephalic, without obvious abnormality, atraumatic Eyes: negative findings: lids and lashes normal and conjunctivae and sclerae normal Ears: bilateral middle ear effusions, o/w nml TM & canal Nose: Nares normal. Septum midline. Mucosa normal. No drainage or sinus tenderness. Throat: dry mucous membranes, missing several teeth Neck: no adenopathy, supple, symmetrical, trachea  midline and thyroid not enlarged, symmetric, no tenderness/mass/nodules Lungs: clear to auscultation bilaterally Heart: regular rate and rhythm, S1, S2 normal, no murmur, click, rub or gallop Abdomen: soft, non-tender; bowel sounds normal; no masses,  no organomegaly Pelvic: external genitalia normal, no bladder tenderness, perianal skin: no external genital warts noted, urethra without abnormality or discharge, uterus surgically absent and positive scant yellow mucous in vagina. Do not find cervix. has raised purple lesion-looks like mole on R vag wall.  Extremities: edema non-pitting, LE edema, obese Pulses: 2+ and symmetric Skin: sweaty. disfiguring large scar L breast. intertrigo under R breast-erythema, thickened skin, red satellite lesions along periphery Lymph nodes: Cervical, supraclavicular, and axillary nodes normal.    Assessment:    1 HTN, fair control.  2 hyperlipidemia, taking niaspan & lipitor 20 mg 3 hypothyroidism, diaphoretic today. Taking 125 mcg synthroid 4 urinary frequency & vag d/c. Stopped lasix 2 mos ago. 10 urinations daily.DD: UTI, Diabetes insipidus, DM, anxiety, caffeine 5 xerostomia, sores nose & mouth. DD: SE of meds, b12 deficiency, AI disease, HSV 6 reflux, water brash, taking 20 mg omperazole 7 constipation, using stool softener, miralax. Likely SE of psych meds. 8 morbid obesity. Recent intentional 20 pound weight loss. 9 blood in toilet, external non thrombosed hemorrhoid on exam,  POC stool card neg for blood 10 preventive care: needs pap, MMG       Plan:     1 Continue verapamil. Exercise, weight loss. CBC. CMET, ua 2 continue meds. Lipids today. Exercise, plant fats & proteins, cut out sugar. 3  continue meds. TSH. 4 UA, culture 5 continue biotene products, hydration: 4-5, 16 oz water daily  6 Start probiotic, small meals, remain upright 3 hrs after eating, H. Pylori IgG 7 eat pear daily. citrucel daily 8 Exercise & calorie goal given. F/u w/  [facility] 9  Tx constipation, 10 pap, wet prep, sched MMG

## 2013-07-01 NOTE — Assessment & Plan Note (Signed)
Start probiotic. Do not over eat. Do not lie down within 2 hours of eating. Watch for food triggers. Test H. Pylori. Treat constipation.

## 2013-07-02 ENCOUNTER — Ambulatory Visit: Payer: No Typology Code available for payment source | Admitting: Psychology

## 2013-07-02 ENCOUNTER — Telehealth: Payer: Self-pay | Admitting: Nurse Practitioner

## 2013-07-02 LAB — URINE CULTURE
Colony Count: NO GROWTH
ORGANISM ID, BACTERIA: NO GROWTH

## 2013-07-02 LAB — VITAMIN D 25 HYDROXY (VIT D DEFICIENCY, FRACTURES): VIT D 25 HYDROXY: 31 ng/mL (ref 30–89)

## 2013-07-02 NOTE — Telephone Encounter (Signed)
Relevant patient education assigned to patient using Emmi. ° °

## 2013-07-04 ENCOUNTER — Telehealth: Payer: Self-pay | Admitting: Nurse Practitioner

## 2013-07-04 ENCOUNTER — Other Ambulatory Visit: Payer: Self-pay | Admitting: *Deleted

## 2013-07-04 DIAGNOSIS — M549 Dorsalgia, unspecified: Secondary | ICD-10-CM

## 2013-07-04 DIAGNOSIS — B9689 Other specified bacterial agents as the cause of diseases classified elsewhere: Secondary | ICD-10-CM

## 2013-07-04 DIAGNOSIS — N760 Acute vaginitis: Principal | ICD-10-CM

## 2013-07-04 DIAGNOSIS — F419 Anxiety disorder, unspecified: Secondary | ICD-10-CM

## 2013-07-04 DIAGNOSIS — F5105 Insomnia due to other mental disorder: Secondary | ICD-10-CM

## 2013-07-04 DIAGNOSIS — E039 Hypothyroidism, unspecified: Secondary | ICD-10-CM

## 2013-07-04 DIAGNOSIS — G8929 Other chronic pain: Secondary | ICD-10-CM

## 2013-07-04 DIAGNOSIS — F4310 Post-traumatic stress disorder, unspecified: Secondary | ICD-10-CM

## 2013-07-04 MED ORDER — NIACIN ER (ANTIHYPERLIPIDEMIC) 500 MG PO TBCR: 500.0000 mg | EXTENDED_RELEASE_TABLET | Freq: Every day | ORAL | Status: DC

## 2013-07-04 MED ORDER — LEVOTHYROXINE SODIUM 112 MCG PO TABS: 112.0000 ug | ORAL_TABLET | Freq: Every day | ORAL | Status: DC

## 2013-07-04 MED ORDER — MIRTAZAPINE 15 MG PO TABS: 7.5000 mg | ORAL_TABLET | Freq: Every day | ORAL | Status: DC

## 2013-07-04 MED ORDER — DIVALPROEX SODIUM ER 500 MG PO TB24: 1500.0000 mg | ORAL_TABLET | Freq: Every day | ORAL | Status: DC

## 2013-07-04 MED ORDER — AMITRIPTYLINE HCL 50 MG PO TABS: 50.0000 mg | ORAL_TABLET | Freq: Every day | ORAL | Status: DC

## 2013-07-04 MED ORDER — METRONIDAZOLE 500 MG PO TABS: 500.0000 mg | ORAL_TABLET | Freq: Two times a day (BID) | ORAL | Status: DC

## 2013-07-04 NOTE — Telephone Encounter (Signed)
Patient notified of results and given information. Pt scheduled f/u appt for 08/15/13 at 10:30 am.

## 2013-07-04 NOTE — Telephone Encounter (Signed)
Urinary frequency not due to UTI or DM. Marland Kitchen Needs to eliminate caffeine. Vag d/c-pos gardnerella, will treat w/oral metronidazole.May need vag estrogen. HGB slightly elevated-probably due to 2nd hand smoke. Cont w/chol meds. TSH low, decrease synthroid. Needs to cont w/Vit D 2000 iu qd. Take b complex qd. Water brash: Neg h pylori. Weight loss, small meals no lying down or reclining within 3 hrs of eating.

## 2013-07-04 NOTE — Telephone Encounter (Signed)
Please advise refills 

## 2013-07-09 ENCOUNTER — Telehealth: Payer: Self-pay | Admitting: Nurse Practitioner

## 2013-07-09 NOTE — Telephone Encounter (Signed)
Please call patient, she has a question about her recent lab results.

## 2013-07-09 NOTE — Telephone Encounter (Signed)
Pt left a voicemail stating that  Allison Sharp was going to help her with her medication until she was able to her doctor. She needs a refill on Depakote She also states someone called her with her test results saying her thyroid was too high but she was unsure what we were going to now. Please advise.

## 2013-07-10 MED ORDER — DIVALPROEX SODIUM ER 500 MG PO TB24: 1500.0000 mg | ORAL_TABLET | Freq: Every day | ORAL | Status: DC

## 2013-07-10 NOTE — Telephone Encounter (Signed)
Called [facility], who stated that they did not receive rx. Resent rx to pharmacy.

## 2013-07-10 NOTE — Telephone Encounter (Signed)
Lm w/ sister. Depakote was ordered w/other meds 2/6. Thyroid was not high, TSH was low, so synthroid was decreased. Pap nml.

## 2013-07-28 ENCOUNTER — Ambulatory Visit (HOSPITAL_COMMUNITY): Payer: Self-pay | Admitting: Psychiatry

## 2013-08-15 ENCOUNTER — Encounter: Payer: Medicare Other | Admitting: Nurse Practitioner

## 2013-08-15 NOTE — Progress Notes (Signed)
Erroneous encounter

## 2013-08-18 ENCOUNTER — Telehealth: Payer: Self-pay | Admitting: *Deleted

## 2013-08-18 NOTE — Telephone Encounter (Signed)
            Pls call pt to advise she missed follow up appt. Last week. Ask if she wants to reschedule.     Irene Pap, NP

## 2013-08-18 NOTE — Telephone Encounter (Signed)
Pt stated that she will call office in a few days to reschedule appointment.

## 2013-09-29 ENCOUNTER — Ambulatory Visit: Payer: Self-pay | Admitting: Nurse Practitioner

## 2013-12-10 ENCOUNTER — Ambulatory Visit
Admission: RE | Admit: 2013-12-10 | Discharge: 2013-12-10 | Disposition: A | Discharge status: Home / Self Care | Payer: Self-pay | Source: Ambulatory Visit | Attending: Nurse Practitioner | Admitting: Nurse Practitioner

## 2013-12-10 ENCOUNTER — Ambulatory Visit (INDEPENDENT_AMBULATORY_CARE_PROVIDER_SITE_OTHER): Payer: Medicare Other | Admitting: Nurse Practitioner

## 2013-12-10 ENCOUNTER — Encounter: Payer: Self-pay | Admitting: Nurse Practitioner

## 2013-12-10 VITALS — BP 140/95 | HR 95 | Temp 98.4°F | Ht 69.0 in | Wt 343.0 lb

## 2013-12-10 DIAGNOSIS — K5909 Other constipation: Secondary | ICD-10-CM

## 2013-12-10 DIAGNOSIS — I10 Essential (primary) hypertension: Secondary | ICD-10-CM

## 2013-12-10 DIAGNOSIS — L905 Scar conditions and fibrosis of skin: Secondary | ICD-10-CM

## 2013-12-10 DIAGNOSIS — E039 Hypothyroidism, unspecified: Secondary | ICD-10-CM

## 2013-12-10 DIAGNOSIS — F329 Major depressive disorder, single episode, unspecified: Secondary | ICD-10-CM

## 2013-12-10 DIAGNOSIS — F319 Bipolar disorder, unspecified: Secondary | ICD-10-CM

## 2013-12-10 DIAGNOSIS — F489 Nonpsychotic mental disorder, unspecified: Secondary | ICD-10-CM

## 2013-12-10 DIAGNOSIS — E559 Vitamin D deficiency, unspecified: Secondary | ICD-10-CM

## 2013-12-10 DIAGNOSIS — K219 Gastro-esophageal reflux disease without esophagitis: Secondary | ICD-10-CM

## 2013-12-10 DIAGNOSIS — E785 Hyperlipidemia, unspecified: Secondary | ICD-10-CM

## 2013-12-10 DIAGNOSIS — F3289 Other specified depressive episodes: Secondary | ICD-10-CM

## 2013-12-10 DIAGNOSIS — Z1239 Encounter for other screening for malignant neoplasm of breast: Secondary | ICD-10-CM

## 2013-12-10 DIAGNOSIS — F5105 Insomnia due to other mental disorder: Secondary | ICD-10-CM

## 2013-12-10 LAB — VITAMIN D 25 HYDROXY (VIT D DEFICIENCY, FRACTURES): VITD: 26.91 ng/mL

## 2013-12-10 LAB — COMPREHENSIVE METABOLIC PANEL
ALBUMIN: 4.5 g/dL (ref 3.5–5.2)
ALT: 11 U/L (ref 0–35)
AST: 17 U/L (ref 0–37)
Alkaline Phosphatase: 87 U/L (ref 39–117)
BUN: 12 mg/dL (ref 6–23)
CO2: 31 meq/L (ref 19–32)
Calcium: 10.6 mg/dL — ABNORMAL HIGH (ref 8.4–10.5)
Chloride: 102 mEq/L (ref 96–112)
Creatinine, Ser: 1 mg/dL (ref 0.4–1.2)
GFR: 59.46 mL/min — AB (ref >60.00)
GLUCOSE: 114 mg/dL — AB (ref 70–99)
POTASSIUM: 4.5 meq/L (ref 3.5–5.1)
SODIUM: 142 meq/L (ref 135–145)
TOTAL PROTEIN: 7.9 g/dL (ref 6.0–8.3)
Total Bilirubin: 0.8 mg/dL (ref 0.2–1.2)

## 2013-12-10 LAB — CBC
HCT: 48.7 % — ABNORMAL HIGH (ref 36.0–46.0)
Hemoglobin: 16.4 g/dL — ABNORMAL HIGH (ref 12.0–15.0)
MCHC: 33.6 g/dL (ref 30.0–36.0)
MCV: 92.7 fl (ref 78.0–100.0)
Platelets: 185 10*3/uL (ref 150.0–400.0)
RBC: 5.25 Mil/uL — AB (ref 3.87–5.11)
RDW: 13.5 % (ref 11.5–15.5)
WBC: 10.6 10*3/uL — AB (ref 4.0–10.5)

## 2013-12-10 LAB — TSH: TSH: 4.26 u[IU]/mL (ref 0.35–4.50)

## 2013-12-10 LAB — MICROALBUMIN / CREATININE URINE RATIO
Creatinine,U: 138.6 mg/dL
Microalb Creat Ratio: 0.6 mg/g (ref 0.0–30.0)
Microalb, Ur: 0.9 mg/dL (ref 0.0–1.9)

## 2013-12-10 MED ORDER — TRAZODONE HCL 100 MG PO TABS: 100.0000 mg | ORAL_TABLET | Freq: Every day | ORAL | Status: DC

## 2013-12-10 MED ORDER — DIVALPROEX SODIUM ER 500 MG PO TB24: 500.0000 mg | ORAL_TABLET | Freq: Three times a day (TID) | ORAL | Status: DC

## 2013-12-10 MED ORDER — DULOXETINE HCL 60 MG PO CPEP: 60.0000 mg | ORAL_CAPSULE | Freq: Every day | ORAL | Status: DC

## 2013-12-10 NOTE — Patient Instructions (Signed)
Take meds as discussed. Follow up in 4 weeks-take blood pressure medicine before coming to appointment. See plastic surgeon. See psychiatry. My office will call with lab results. Great job with weight loss!!! You are being good to yourself with your new diet changes! Great to see you!

## 2013-12-10 NOTE — Progress Notes (Signed)
Pre visit review using our clinic review tool, if applicable. No additional management support is needed unless otherwise documented below in the visit note. 

## 2013-12-11 DIAGNOSIS — L905 Scar conditions and fibrosis of skin: Secondary | ICD-10-CM | POA: Insufficient documentation

## 2013-12-11 LAB — LIPID PANEL
CHOLESTEROL: 168 mg/dL (ref 0–200)
HDL: 48.6 mg/dL (ref >39.00)
LDL CALC: 89 mg/dL (ref 0–99)
NonHDL: 119.4
Total CHOL/HDL Ratio: 3
Triglycerides: 150 mg/dL — ABNORMAL HIGH (ref 0.0–149.0)
VLDL: 30 mg/dL (ref 0.0–40.0)
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
## 2013-12-11 NOTE — Assessment & Plan Note (Signed)
Symptoms well controlled.  Lost 8 more pounds. Eating better & smaller portions.

## 2013-12-11 NOTE — Assessment & Plan Note (Signed)
Taking 2000 iu qd. Check level today.

## 2013-12-11 NOTE — Assessment & Plan Note (Signed)
No underwire. Wear sports bra if tolerat. Ref to plastic surgeon for eval & treat.

## 2013-12-11 NOTE — Assessment & Plan Note (Signed)
Continue trazodone. Stop remeron & amitryptilline.

## 2013-12-12 ENCOUNTER — Telehealth: Payer: Self-pay | Admitting: Nurse Practitioner

## 2013-12-12 DIAGNOSIS — E039 Hypothyroidism, unspecified: Secondary | ICD-10-CM

## 2013-12-12 NOTE — Telephone Encounter (Signed)
pls call pt: Advise Vit D still too low-needs 2000 iu D3 daily, take with supper. Not getting enough thyroid hormone-instruct her to take daily, 1 hour before breakfst and coffee-keep beside bed w/glass of water & set alarm 1 hr before gets up or just wait to eat or drink for 1 hour after she takes it. I will check level again when she returns in 4 weeks. All other labs look good.

## 2013-12-12 NOTE — Telephone Encounter (Signed)
Patient notified of results. Patient expressed understanding.  

## 2013-12-22 ENCOUNTER — Telehealth: Payer: Self-pay | Admitting: *Deleted

## 2013-12-22 DIAGNOSIS — F319 Bipolar disorder, unspecified: Secondary | ICD-10-CM

## 2013-12-22 MED ORDER — DULOXETINE HCL 60 MG PO CPEP: 60.0000 mg | ORAL_CAPSULE | Freq: Every day | ORAL | Status: DC

## 2013-12-22 NOTE — Telephone Encounter (Signed)
Patient left vm requesting cb. Returned call, patient requested that Cymbalta rx be resent to pharmacy. Pharmacy did not receiv original rx sent on 715/2015. Patient also needed the directions for vit d & dose. Patient informed.

## 2013-12-25 ENCOUNTER — Other Ambulatory Visit: Payer: Self-pay | Admitting: *Deleted

## 2013-12-25 ENCOUNTER — Telehealth: Payer: Self-pay | Admitting: *Deleted

## 2013-12-25 DIAGNOSIS — J410 Simple chronic bronchitis: Secondary | ICD-10-CM

## 2013-12-25 MED ORDER — ALBUTEROL SULFATE HFA 108 (90 BASE) MCG/ACT IN AERS: 2.0000 | INHALATION_SPRAY | Freq: Four times a day (QID) | RESPIRATORY_TRACT | Status: DC | PRN

## 2013-12-25 NOTE — Telephone Encounter (Addendum)
Patient called office requesting proair hfa, 90 mcg 2 puffs 4 times daily prn. Rx has not been filled by provider.

## 2014-01-07 ENCOUNTER — Ambulatory Visit (INDEPENDENT_AMBULATORY_CARE_PROVIDER_SITE_OTHER): Payer: Medicare Other | Admitting: Nurse Practitioner

## 2014-01-07 ENCOUNTER — Encounter: Payer: Self-pay | Admitting: Nurse Practitioner

## 2014-01-07 VITALS — BP 119/73 | HR 88 | Temp 98.6°F | Ht 69.0 in | Wt 347.0 lb

## 2014-01-07 DIAGNOSIS — M25562 Pain in left knee: Secondary | ICD-10-CM

## 2014-01-07 DIAGNOSIS — F332 Major depressive disorder, recurrent severe without psychotic features: Secondary | ICD-10-CM

## 2014-01-07 DIAGNOSIS — K5900 Constipation, unspecified: Secondary | ICD-10-CM

## 2014-01-07 DIAGNOSIS — K219 Gastro-esophageal reflux disease without esophagitis: Secondary | ICD-10-CM

## 2014-01-07 DIAGNOSIS — M25561 Pain in right knee: Secondary | ICD-10-CM

## 2014-01-07 DIAGNOSIS — K5909 Other constipation: Secondary | ICD-10-CM

## 2014-01-07 DIAGNOSIS — M25569 Pain in unspecified knee: Secondary | ICD-10-CM

## 2014-01-07 DIAGNOSIS — E039 Hypothyroidism, unspecified: Secondary | ICD-10-CM

## 2014-01-07 LAB — TSH: TSH: 0.35 u[IU]/mL (ref 0.35–4.50)

## 2014-01-07 MED ORDER — OMEPRAZOLE 20 MG PO CPDR: 20.0000 mg | DELAYED_RELEASE_CAPSULE | Freq: Every day | ORAL | Status: DC

## 2014-01-07 MED ORDER — BISACODYL 10 MG RE SUPP: 10.0000 mg | RECTAL | Status: DC | PRN

## 2014-01-07 MED ORDER — CELECOXIB 200 MG PO CAPS: 200.0000 mg | ORAL_CAPSULE | Freq: Two times a day (BID) | ORAL | Status: DC

## 2014-01-07 MED ORDER — DULOXETINE HCL 60 MG PO CPEP: 60.0000 mg | ORAL_CAPSULE | Freq: Two times a day (BID) | ORAL | Status: DC

## 2014-01-07 NOTE — Assessment & Plan Note (Signed)
Pt has been taking med 1 hr before breakfast & coffee as instructed for 1 mo.  TSH today Adjust as needed.

## 2014-01-07 NOTE — Assessment & Plan Note (Addendum)
Risk factor: obsesity Bilat knee replcmts Celebrex trial. GERD.

## 2014-01-07 NOTE — Progress Notes (Signed)
Subjective:     Allison Sharp is a 58 y.o. female presents for follow up thyroid med adjustment. She had been taking w/breakfast. I instructed to take 1 hr before food & coffee 4 wks ago-she has been doing this. Will check level today.  She has appt pending w/BH in [place], [place] this week. She needs cymbalta refilled. Plan is for psychiatry to begin filling meds again. She c/o worse indigestion & constipation last week. Ran out of omeprazole, hasn't refilled due to cost. Feels like food coming back up. Gained 4 lbs. Since last visit. States not overeating. C/o bilateral knee pain. Used celebrex before w/relief. Best NSAID choice given GERD. C/o trouble going back to sleep after waking to go to b-room at night. Feels tired during day-takes nap after lunch. Discussed other causes of daytime sleepiness-lack of activity, foods-low quality carbs meats w/tryptophan.  The following portions of the patient's history were reviewed and updated as appropriate: allergies, current medications, past medical history, past social history, past surgical history and problem list.  Review of Systems Pertinent items are noted in HPI.    Objective:    BP 119/73  Pulse 88  Temp(Src) 98.6 F (37 C) (Temporal)  Ht 5\' 9"  (1.753 m)  Wt 347 lb (157.398 kg)  BMI 51.22 kg/m2  SpO2 97%  LMP 02/18/1995 BP 119/73  Pulse 88  Temp(Src) 98.6 F (37 C) (Temporal)  Ht 5\' 9"  (1.753 m)  Wt 347 lb (157.398 kg)  BMI 51.22 kg/m2  SpO2 97%  LMP 02/18/1995 General appearance: alert, cooperative, appears stated age and no distress Head: Normocephalic, without obvious abnormality, atraumatic Eyes: negative findings: lids and lashes normal and conjunctivae and sclerae normal    Assessment:     1. Unspecified hypothyroidism - TSH  2. Unspecified constipation - bisacodyl (DULCOLAX) 10 MG suppository; Place 1 suppository (10 mg total) rectally as needed for moderate constipation.  Dispense: 12 suppository; Refill: 0  3.  Gastroesophageal reflux disease without esophagitis - omeprazole (PRILOSEC) 20 MG capsule; Take 1 capsule (20 mg total) by mouth daily.  Dispense: 30 capsule; Refill: 6  4. Bilateral knee pain - celecoxib (CELEBREX) 200 MG capsule; Take 1 capsule (200 mg total) by mouth 2 (two) times daily.  Dispense: 30 capsule; Refill: 3  5. Major depressive disorder, recurrent episode, severe, without mention of psychotic behavior - DULoxetine (CYMBALTA) 60 MG capsule; Take 1 capsule (60 mg total) by mouth 2 (two) times daily.  Dispense: 60 capsule; Refill: 0  See problem list for complete A&P See pt instructions. F/u 6 mos or PRN constipation

## 2014-01-07 NOTE — Progress Notes (Signed)
Pre visit review using our clinic review tool, if applicable. No additional management support is needed unless otherwise documented below in the visit note. 

## 2014-01-07 NOTE — Assessment & Plan Note (Signed)
Pt ran out of med. Last week got constipated & began to have indigestion again. Refill med Tx constipation.

## 2014-01-07 NOTE — Assessment & Plan Note (Signed)
Dulcolax suppositories daily until 3 bowel movements 3 servings uncooked foods daily.

## 2014-01-07 NOTE — Assessment & Plan Note (Signed)
Appt pending w/BH in [place] this week. Dr Reather Converse. Refill cymbalta today. psych to treat in future.

## 2014-01-07 NOTE — Patient Instructions (Signed)
Continue to take thyroid medicine 1 hour before breakfast & coffee. I will call with lab results.  Keep appointment with behavioral health.  Start dulcolax suppositories, 1 daily until you have at least 3-5 bowel movements. This will make reflux better, but re-start omeprazole. Continue to eat fresh foods (uncooked) every day-at least 3 servings. More is better.  Napping may interfere with sleep quality at night. Try to avoid chicken & fish at lunch, low quality carbohydrates (less than 4 grams fiber per serving) at breakfast. This will help with not being so tired in afternoons. Also a 15 to 20  Minute walk at lunch will boost energy in afternoons.  If constipation & reflux don't get better, come back to see me in 1 month, otherwise I will see you in 6 months.  Flu shots will be available in a few weeks.  Nice to see you!

## 2014-01-08 ENCOUNTER — Telehealth: Payer: Self-pay | Admitting: Nurse Practitioner

## 2014-01-08 ENCOUNTER — Other Ambulatory Visit: Payer: Self-pay | Admitting: *Deleted

## 2014-01-08 DIAGNOSIS — K219 Gastro-esophageal reflux disease without esophagitis: Secondary | ICD-10-CM

## 2014-01-08 MED ORDER — OMEPRAZOLE 20 MG PO CPDR: 20.0000 mg | DELAYED_RELEASE_CAPSULE | Freq: Every day | ORAL | Status: DC

## 2014-01-08 NOTE — Telephone Encounter (Signed)
pls call pt: Advise Thyroid control is much better with taking med correctly, but because it changed so much, let's check 1 more time in 6 weeks to make sure it is stable. Pls sched lab appt. In 6 wks.

## 2014-01-08 NOTE — Telephone Encounter (Signed)
Patient was notified of results. Patient will cb to schedule f/u lab.

## 2014-01-16 ENCOUNTER — Telehealth: Payer: Self-pay | Admitting: Family Medicine

## 2014-01-16 ENCOUNTER — Other Ambulatory Visit: Payer: Self-pay | Admitting: *Deleted

## 2014-01-16 ENCOUNTER — Other Ambulatory Visit: Payer: Self-pay | Admitting: Family Medicine

## 2014-01-16 DIAGNOSIS — M25561 Pain in right knee: Secondary | ICD-10-CM

## 2014-01-16 DIAGNOSIS — M25562 Pain in left knee: Secondary | ICD-10-CM

## 2014-01-16 DIAGNOSIS — E039 Hypothyroidism, unspecified: Secondary | ICD-10-CM

## 2014-01-16 DIAGNOSIS — F332 Major depressive disorder, recurrent severe without psychotic features: Secondary | ICD-10-CM

## 2014-01-16 MED ORDER — LEVOTHYROXINE SODIUM 112 MCG PO TABS: 112.0000 ug | ORAL_TABLET | Freq: Every day | ORAL | Status: DC

## 2014-01-16 MED ORDER — CELECOXIB 200 MG PO CAPS: 200.0000 mg | ORAL_CAPSULE | Freq: Two times a day (BID) | ORAL | Status: DC

## 2014-01-16 MED ORDER — DULOXETINE HCL 60 MG PO CPEP: 60.0000 mg | ORAL_CAPSULE | Freq: Two times a day (BID) | ORAL | Status: DC

## 2014-01-16 NOTE — Telephone Encounter (Signed)
Patient called office requesting corrections on her 2 of her meds, Cymbalta and Celebrex. Patient stated that Cymbalta should read 2 tabs 2x daily. Patient has 2 tabs left of Cymbalta. Celebrex rx had quanity of #30 tabs instead of #60. Patient is out of Celebrex. Please advise?

## 2014-01-16 NOTE — Telephone Encounter (Signed)
Pt called requesting a "correction" in her cymbalta rx: she has apparently been taking TWO of the 60mg  caps TWICE per day instead of 1 bid as prescribed. She has run out of meds and wants rf and wants [name] to change the sig to 2 tabs bid.  I refused to do this, and I recommended pt decrease dosing of cymbalta to ONE 60mg  cap bid.  I did allow for RF of this med today with this sig, #60, no RF's.  As per review of Layne Weaver's last o/v, Pt has appt with [facility] next month.

## 2014-01-22 ENCOUNTER — Other Ambulatory Visit: Payer: Self-pay

## 2014-01-22 DIAGNOSIS — E039 Hypothyroidism, unspecified: Secondary | ICD-10-CM

## 2014-01-22 MED ORDER — LEVOTHYROXINE SODIUM 112 MCG PO TABS: 112.0000 ug | ORAL_TABLET | Freq: Every day | ORAL | Status: DC

## 2014-01-23 ENCOUNTER — Other Ambulatory Visit: Payer: Self-pay

## 2014-02-09 ENCOUNTER — Telehealth: Payer: Self-pay

## 2014-02-09 NOTE — Telephone Encounter (Signed)
[facility] Last OV 01/07/2014

## 2014-02-10 NOTE — Telephone Encounter (Signed)
Allison Sharp told me she had appt. Pending w/Dr Pierre-psychiatry, in [place], [name]. He should be prescribing all her psych meds.Did she keep appt.?

## 2014-02-11 NOTE — Telephone Encounter (Signed)
Spoke w/ pt and she said that Dr. Reather Converse will be refilling this medication.  [facility] and make sure the requests go to correct MD.

## 2014-02-24 ENCOUNTER — Telehealth: Payer: Self-pay

## 2014-02-24 ENCOUNTER — Other Ambulatory Visit: Payer: Self-pay

## 2014-02-24 DIAGNOSIS — M25561 Pain in right knee: Secondary | ICD-10-CM

## 2014-02-24 DIAGNOSIS — E039 Hypothyroidism, unspecified: Secondary | ICD-10-CM

## 2014-02-24 DIAGNOSIS — M25562 Pain in left knee: Secondary | ICD-10-CM

## 2014-02-24 DIAGNOSIS — K219 Gastro-esophageal reflux disease without esophagitis: Secondary | ICD-10-CM

## 2014-02-24 MED ORDER — CELECOXIB 200 MG PO CAPS: 200.0000 mg | ORAL_CAPSULE | Freq: Two times a day (BID) | ORAL | Status: DC

## 2014-02-24 MED ORDER — NYSTATIN-TRIAMCINOLONE 100000-0.1 UNIT/GM-% EX OINT: TOPICAL_OINTMENT | Freq: Two times a day (BID) | CUTANEOUS | Status: DC

## 2014-02-24 MED ORDER — OMEPRAZOLE 20 MG PO CPDR: 20.0000 mg | DELAYED_RELEASE_CAPSULE | Freq: Every day | ORAL | Status: DC

## 2014-02-24 MED ORDER — DIVALPROEX SODIUM ER 500 MG PO TB24: 500.0000 mg | ORAL_TABLET | Freq: Three times a day (TID) | ORAL | Status: DC

## 2014-02-24 MED ORDER — VERAPAMIL HCL ER 180 MG PO TBCR: 180.0000 mg | EXTENDED_RELEASE_TABLET | Freq: Every morning | ORAL | Status: DC

## 2014-02-24 MED ORDER — ATORVASTATIN CALCIUM 20 MG PO TABS: 20.0000 mg | ORAL_TABLET | Freq: Every day | ORAL | Status: DC

## 2014-02-24 MED ORDER — LEVOTHYROXINE SODIUM 112 MCG PO TABS: 112.0000 ug | ORAL_TABLET | Freq: Every day | ORAL | Status: DC

## 2014-02-24 NOTE — Addendum Note (Signed)
Addended by: Monico Blitz T on: 02/24/2014 03:10 PM   Modules accepted: Orders

## 2014-02-24 NOTE — Telephone Encounter (Signed)
Requesting 90 supply for divalproex sod er 500 mg tab, 1 tab po tid on occasions may take 1 during the day for extreme stress, qty 90 w/ 1 rf, atorvastatin 20 mg 1 tab po daily qty 90 w/ 1 rf, and celecoxib 200 mg capsule 1 cap bid qty 180 w. 1 rf, rx done and sent to pharmacy

## 2014-02-25 ENCOUNTER — Other Ambulatory Visit: Payer: Self-pay | Admitting: *Deleted

## 2014-02-25 DIAGNOSIS — F5105 Insomnia due to other mental disorder: Secondary | ICD-10-CM

## 2014-02-25 NOTE — Telephone Encounter (Signed)
All psych meds should be filled by Dr Reather Converse in [place], [name]. I think I filled this for her 1 time, with understanding that Dr Reather Converse would take over. Please ask pt to call Dr Nuala Alpha office.

## 2014-02-25 NOTE — Telephone Encounter (Signed)
Refill request for trazodone Last filled by MD on- 12/10/13 #30 x1 Last Appt: 01/07/2014 Next Appt: 02/27/2014 Please advise refill?

## 2014-02-27 ENCOUNTER — Ambulatory Visit: Payer: Medicare Other | Admitting: Nurse Practitioner

## 2014-03-06 NOTE — Telephone Encounter (Signed)
Per Ival Bible, pharmacy was contacted & told to contact Dr Reather Converse.

## 2014-04-01 ENCOUNTER — Ambulatory Visit (INDEPENDENT_AMBULATORY_CARE_PROVIDER_SITE_OTHER): Payer: Medicare Other | Admitting: Nurse Practitioner

## 2014-04-01 ENCOUNTER — Encounter: Payer: Self-pay | Admitting: Nurse Practitioner

## 2014-04-01 VITALS — BP 124/84 | HR 95 | Temp 98.0°F | Resp 18 | Ht 69.0 in | Wt 338.0 lb

## 2014-04-01 DIAGNOSIS — N3090 Cystitis, unspecified without hematuria: Secondary | ICD-10-CM | POA: Insufficient documentation

## 2014-04-01 DIAGNOSIS — N3001 Acute cystitis with hematuria: Secondary | ICD-10-CM

## 2014-04-01 DIAGNOSIS — M25561 Pain in right knee: Secondary | ICD-10-CM

## 2014-04-01 DIAGNOSIS — R300 Dysuria: Secondary | ICD-10-CM

## 2014-04-01 DIAGNOSIS — E039 Hypothyroidism, unspecified: Secondary | ICD-10-CM

## 2014-04-01 DIAGNOSIS — M25562 Pain in left knee: Secondary | ICD-10-CM

## 2014-04-01 LAB — CBC
HEMATOCRIT: 42.6 % (ref 36.0–46.0)
HEMOGLOBIN: 14 g/dL (ref 12.0–15.0)
MCHC: 32.8 g/dL (ref 30.0–36.0)
MCV: 93.1 fl (ref 78.0–100.0)
Platelets: 110 10*3/uL — ABNORMAL LOW (ref 150.0–400.0)
RBC: 4.58 Mil/uL (ref 3.87–5.11)
RDW: 14.6 % (ref 11.5–15.5)
WBC: 5.1 10*3/uL (ref 4.0–10.5)

## 2014-04-01 LAB — POCT URINALYSIS DIPSTICK
Bilirubin, UA: NEGATIVE
Glucose, UA: NEGATIVE
KETONES UA: NEGATIVE
Nitrite, UA: NEGATIVE
PROTEIN UA: 30
Spec Grav, UA: 1.02
UROBILINOGEN UA: 0.2
pH, UA: 6.5

## 2014-04-01 MED ORDER — PHENAZOPYRIDINE HCL 200 MG PO TABS: 200.0000 mg | ORAL_TABLET | Freq: Three times a day (TID) | ORAL | Status: DC | PRN

## 2014-04-01 MED ORDER — CELECOXIB 200 MG PO CAPS: 200.0000 mg | ORAL_CAPSULE | Freq: Two times a day (BID) | ORAL | Status: DC

## 2014-04-01 MED ORDER — NITROFURANTOIN MONOHYD MACRO 100 MG PO CAPS: 100.0000 mg | ORAL_CAPSULE | Freq: Two times a day (BID) | ORAL | Status: DC

## 2014-04-01 NOTE — Progress Notes (Signed)
Pre visit review using our clinic review tool, if applicable. No additional management support is needed unless otherwise documented below in the visit note. 

## 2014-04-01 NOTE — Progress Notes (Signed)
Subjective:     Allison Sharp is a 58 y.o. female who presents for f/u of hypothyroidism, knee pain & dysuria. She reports no overt s&s of thyroid disease. She continues to struggle with obesity, but is keeping weight stable. Last TSH had changed considerably after she starting taking levothyroxine appropriately. I will check level today to see if stable. She has ongoing R knee pain-has had  Bilateral knee replacements. Shehas had minimal relief w/celebrex-taking qd. Advised OK to increase to BID on days she is running errands. Also advised to apply aspercream to knee TID followed by heat. Regarding dysuria: symptoms started about 1 mo ago. Associated symptoms: suprapubic pressure & back pain, occasional dizzyness. She denies fever, nausea, chills.   The following portions of the patient's history were reviewed and updated as appropriate: allergies, current medications, past medical history, past social history, past surgical history and problem list.  Review of Systems Pertinent items are noted in HPI.    Objective:    BP 124/84 mmHg  Pulse 95  Temp(Src) 98 F (36.7 C) (Oral)  Resp 18  Ht 5\' 9"  (1.753 m)  Wt 338 lb (153.316 kg)  BMI 49.89 kg/m2  SpO2 93%  LMP 02/18/1995 BP 124/84 mmHg  Pulse 95  Temp(Src) 98 F (36.7 C) (Oral)  Resp 18  Ht 5\' 9"  (1.753 m)  Wt 338 lb (153.316 kg)  BMI 49.89 kg/m2  SpO2 93%  LMP 02/18/1995 General appearance: alert, cooperative, appears stated age and no distress Head: Normocephalic, without obvious abnormality, atraumatic Eyes: negative findings: lids and lashes normal and conjunctivae and sclerae normal Back: no CVA tenderness   Abdomen: pendulous, obese   Assessment:     1. Dysuria - Urine culture - POCT urinalysis dipstick - CBC  2. Hypothyroidism, unspecified hypothyroidism type - TSH  3. Acute cystitis with hematuria - nitrofurantoin, macrocrystal-monohydrate, (MACROBID) 100 MG capsule; Take 1 capsule (100 mg total) by mouth  2 (two) times daily.  Dispense: 10 capsule; Refill: 0 - phenazopyridine (PYRIDIUM) 200 MG tablet; Take 1 tablet (200 mg total) by mouth 3 (three) times daily as needed for pain.  Dispense: 6 tablet; Refill: 0  4. Bilateral knee pain - celecoxib (CELEBREX) 200 MG capsule; Take 1 capsule (200 mg total) by mouth 2 (two) times daily.  Dispense: 180 capsule; Refill: 1 Aspercream tid followed by heat  Gave pt HO to maximize nutrition & help with weight loss. F/u 6 mos or sooner if no resolve of urinary symptoms within 4 days.

## 2014-04-01 NOTE — Patient Instructions (Signed)
Start antibiotic. Our office will call if we need to change the antibiotic. Take pyridium to relax bladder, caution: urine tears & sweat will be orange. Do not be alarmed! Sip hydrating fluids (water, juice, colorless soda, decaff tea) every hour to flush kidneys. Return if symptoms do not improve or you feel worse.  OK to take celebrex twice daily on days that you have many errands. Refer to handout for best nutrition.  We will call with lab results regarding thyroid. Remember to take vitamin D3 2000 iu daily with meal. Nice to see you!   Urinary Tract Infection Urinary tract infections (UTIs) can develop anywhere along your urinary tract. Your urinary tract is your body's drainage system for removing wastes and extra water. Your urinary tract includes two kidneys, two ureters, a bladder, and a urethra. Your kidneys are a pair of bean-shaped organs. Each kidney is about the size of your fist. They are located below your ribs, one on each side of your spine. CAUSES Infections are caused by microbes, which are microscopic organisms, including fungi, viruses, and bacteria. These organisms are so small that they can only be seen through a microscope. Bacteria are the microbes that most commonly cause UTIs. SYMPTOMS  Symptoms of UTIs may vary by age and gender of the patient and by the location of the infection. Symptoms in young women typically include a frequent and intense urge to urinate and a painful, burning feeling in the bladder or urethra during urination. Older women and men are more likely to be tired, shaky, and weak and have muscle aches and abdominal pain. A fever may mean the infection is in your kidneys. Other symptoms of a kidney infection include pain in your back or sides below the ribs, nausea, and vomiting. DIAGNOSIS To diagnose a UTI, your caregiver will ask you about your symptoms. Your caregiver also will ask to provide a urine sample. The urine sample will be tested for bacteria  and white blood cells. White blood cells are made by your body to help fight infection. TREATMENT  Typically, UTIs can be treated with medication. Because most UTIs are caused by a bacterial infection, they usually can be treated with the use of antibiotics. The choice of antibiotic and length of treatment depend on your symptoms and the type of bacteria causing your infection. HOME CARE INSTRUCTIONS  If you were prescribed antibiotics, take them exactly as your caregiver instructs you. Finish the medication even if you feel better after you have only taken some of the medication.  Drink enough water and fluids to keep your urine clear or pale yellow.  Avoid caffeine, tea, and carbonated beverages. They tend to irritate your bladder.  Empty your bladder often. Avoid holding urine for long periods of time.  Empty your bladder before and after sexual intercourse.  After a bowel movement, women should cleanse from front to back. Use each tissue only once. SEEK MEDICAL CARE IF:   You have back pain.  You develop a fever.  Your symptoms do not begin to resolve within 3 days. SEEK IMMEDIATE MEDICAL CARE IF:   You have severe back pain or lower abdominal pain.  You develop chills.  You have nausea or vomiting.  You have continued burning or discomfort with urination. MAKE SURE YOU:   Understand these instructions.  Will watch your condition.  Will get help right away if you are not doing well or get worse. Document Released: 02/22/2005 Document Revised: 11/14/2011 Document Reviewed: 06/23/2011 Exit[employer] Patient Information  2014 [place], [place].

## 2014-04-02 LAB — TSH: TSH: 2.86 u[IU]/mL (ref 0.35–4.50)

## 2014-04-03 ENCOUNTER — Ambulatory Visit: Payer: Self-pay | Admitting: Nurse Practitioner

## 2014-04-04 LAB — URINE CULTURE

## 2014-04-06 ENCOUNTER — Telehealth: Payer: Self-pay | Admitting: Nurse Practitioner

## 2014-04-06 DIAGNOSIS — N3001 Acute cystitis with hematuria: Secondary | ICD-10-CM

## 2014-04-06 DIAGNOSIS — D696 Thrombocytopenia, unspecified: Secondary | ICD-10-CM

## 2014-04-06 MED ORDER — SULFAMETHOXAZOLE-TRIMETHOPRIM 800-160 MG PO TABS: 1.0000 | ORAL_TABLET | Freq: Two times a day (BID) | ORAL | Status: DC

## 2014-04-06 NOTE — Telephone Encounter (Signed)
Spoke with pt, advised message from [place] in detail. Pt made appt to see Layne.

## 2014-04-06 NOTE — Telephone Encounter (Signed)
pls call pt: Advise Urine culture result shows bacteria is resistant to ABX. Will need to change to bactrim. Sent script.  Thyroid studies look good, no change. Platelets were low, may be medication related. She needs to come in for more labs & discuss med changes. Schedule visit for about 3 weeks, I can recheck urine also. In the meantime, she should take meds as follows: Stop lasix & atorvastatin. Celebrex once daily. Take antibiotic at breakfast & bedtime. Morning: levothyroxine 1 hr before coffee, breakfast & other meds. Breakfast: cymbalta, depakote, aspercream on knee Lunch: omeprazole, depakote, celebrex Supper: verapamil, depakote, Vitamin D, aspercream on knee Bedtime: cymbalta, remeron, trazodone, dulcolax, aspercream on knee  Copy & paste med schedule into mychart note if she has access to computer at home or mail copy.  I will discuss reasons for med schedule at follow up. Future labs are ordered for f/u visit.

## 2014-04-07 ENCOUNTER — Telehealth: Payer: Self-pay | Admitting: Family Medicine

## 2014-04-07 ENCOUNTER — Other Ambulatory Visit: Payer: Self-pay | Admitting: Nurse Practitioner

## 2014-04-07 DIAGNOSIS — Z9181 History of falling: Secondary | ICD-10-CM

## 2014-04-07 NOTE — Telephone Encounter (Signed)
I spoke with pt, can we give her a Rx for a cane?

## 2014-04-07 NOTE — Telephone Encounter (Signed)
Called pt back. Patient had questions about instructions for taking divalproex.

## 2014-04-07 NOTE — Telephone Encounter (Signed)
Allison Sharp called and would like someone to call her with the latest test results. She would also like for Dr. Anitra Lauth to send a RX to [facility] in [facility] for a heavy duty cane. Her BD: 04/16/2056. Phone: [phone].

## 2014-04-07 NOTE — Telephone Encounter (Signed)
pls call pt: Clarify what her questions are. Jonelle Sidle just spoke with her yesterday. I will send order for cane.

## 2014-05-05 ENCOUNTER — Telehealth: Payer: Self-pay

## 2014-05-05 ENCOUNTER — Ambulatory Visit: Payer: Medicare Other | Admitting: Nurse Practitioner

## 2014-05-05 ENCOUNTER — Other Ambulatory Visit: Payer: Self-pay | Admitting: Nurse Practitioner

## 2014-05-05 DIAGNOSIS — E039 Hypothyroidism, unspecified: Secondary | ICD-10-CM

## 2014-05-05 MED ORDER — LEVOTHYROXINE SODIUM 112 MCG PO TABS: 112.0000 ug | ORAL_TABLET | Freq: Every day | ORAL | Status: DC

## 2014-05-05 NOTE — Telephone Encounter (Signed)
Pt would like refill on Levothyroxine 112 mcg. Last refill 02/24/2014. Please advise.

## 2014-05-08 ENCOUNTER — Other Ambulatory Visit: Payer: Self-pay | Admitting: *Deleted

## 2014-05-08 DIAGNOSIS — E039 Hypothyroidism, unspecified: Secondary | ICD-10-CM

## 2014-05-08 MED ORDER — LEVOTHYROXINE SODIUM 112 MCG PO TABS: 112.0000 ug | ORAL_TABLET | Freq: Every day | ORAL | Status: DC

## 2014-05-28 ENCOUNTER — Other Ambulatory Visit: Payer: Self-pay | Admitting: *Deleted

## 2014-06-01 DIAGNOSIS — F3130 Bipolar disorder, current episode depressed, mild or moderate severity, unspecified: Secondary | ICD-10-CM | POA: Diagnosis not present

## 2014-06-01 MED ORDER — NYSTATIN-TRIAMCINOLONE 100000-0.1 UNIT/GM-% EX OINT: TOPICAL_OINTMENT | Freq: Two times a day (BID) | CUTANEOUS | Status: DC

## 2014-06-01 NOTE — Telephone Encounter (Signed)
Refill request for nystatin-triam ointment Last filled by MD on- 02/24/14 #30g x0 Last Appt: 04/01/2014 Next Appt: none Please advise refill?

## 2014-06-08 DIAGNOSIS — F3130 Bipolar disorder, current episode depressed, mild or moderate severity, unspecified: Secondary | ICD-10-CM | POA: Diagnosis not present

## 2014-06-11 DIAGNOSIS — F3130 Bipolar disorder, current episode depressed, mild or moderate severity, unspecified: Secondary | ICD-10-CM | POA: Diagnosis not present

## 2014-06-12 ENCOUNTER — Other Ambulatory Visit: Payer: Self-pay | Admitting: Nurse Practitioner

## 2014-06-12 DIAGNOSIS — B372 Candidiasis of skin and nail: Secondary | ICD-10-CM

## 2014-06-12 MED ORDER — CLOTRIMAZOLE 1 % EX OINT: TOPICAL_OINTMENT | CUTANEOUS | Status: DC

## 2014-06-15 NOTE — Progress Notes (Signed)
Patient notified

## 2014-06-18 DIAGNOSIS — F3130 Bipolar disorder, current episode depressed, mild or moderate severity, unspecified: Secondary | ICD-10-CM | POA: Diagnosis not present

## 2014-06-30 DIAGNOSIS — S301XXA Contusion of abdominal wall, initial encounter: Secondary | ICD-10-CM | POA: Diagnosis not present

## 2014-06-30 DIAGNOSIS — W19XXXA Unspecified fall, initial encounter: Secondary | ICD-10-CM | POA: Diagnosis not present

## 2014-06-30 DIAGNOSIS — Z043 Encounter for examination and observation following other accident: Secondary | ICD-10-CM | POA: Diagnosis not present

## 2014-06-30 DIAGNOSIS — I10 Essential (primary) hypertension: Secondary | ICD-10-CM | POA: Diagnosis not present

## 2014-06-30 DIAGNOSIS — W1830XA Fall on same level, unspecified, initial encounter: Secondary | ICD-10-CM | POA: Diagnosis not present

## 2014-06-30 DIAGNOSIS — M25562 Pain in left knee: Secondary | ICD-10-CM | POA: Diagnosis not present

## 2014-06-30 DIAGNOSIS — S8002XA Contusion of left knee, initial encounter: Secondary | ICD-10-CM | POA: Diagnosis not present

## 2014-06-30 DIAGNOSIS — Z96652 Presence of left artificial knee joint: Secondary | ICD-10-CM | POA: Diagnosis not present

## 2014-06-30 DIAGNOSIS — M25569 Pain in unspecified knee: Secondary | ICD-10-CM | POA: Diagnosis not present

## 2014-06-30 DIAGNOSIS — M899 Disorder of bone, unspecified: Secondary | ICD-10-CM | POA: Diagnosis not present

## 2014-06-30 DIAGNOSIS — G8911 Acute pain due to trauma: Secondary | ICD-10-CM | POA: Diagnosis not present

## 2014-06-30 DIAGNOSIS — I252 Old myocardial infarction: Secondary | ICD-10-CM | POA: Diagnosis not present

## 2014-07-02 ENCOUNTER — Ambulatory Visit: Payer: Self-pay

## 2014-08-13 ENCOUNTER — Ambulatory Visit: Payer: Self-pay | Admitting: Gastroenterology

## 2014-08-20 DIAGNOSIS — M2550 Pain in unspecified joint: Secondary | ICD-10-CM | POA: Diagnosis not present

## 2014-08-20 DIAGNOSIS — M25561 Pain in right knee: Secondary | ICD-10-CM | POA: Diagnosis not present

## 2014-08-20 DIAGNOSIS — F4310 Post-traumatic stress disorder, unspecified: Secondary | ICD-10-CM | POA: Diagnosis not present

## 2014-08-20 DIAGNOSIS — F331 Major depressive disorder, recurrent, moderate: Secondary | ICD-10-CM | POA: Diagnosis not present

## 2014-08-20 DIAGNOSIS — F3175 Bipolar disorder, in partial remission, most recent episode depressed: Secondary | ICD-10-CM | POA: Diagnosis not present

## 2014-08-26 DIAGNOSIS — F4310 Post-traumatic stress disorder, unspecified: Secondary | ICD-10-CM | POA: Diagnosis not present

## 2014-08-26 DIAGNOSIS — Z886 Allergy status to analgesic agent status: Secondary | ICD-10-CM | POA: Diagnosis not present

## 2014-08-26 DIAGNOSIS — Z79899 Other long term (current) drug therapy: Secondary | ICD-10-CM | POA: Diagnosis not present

## 2014-08-26 DIAGNOSIS — Z90710 Acquired absence of both cervix and uterus: Secondary | ICD-10-CM | POA: Diagnosis not present

## 2014-08-26 DIAGNOSIS — Z888 Allergy status to other drugs, medicaments and biological substances status: Secondary | ICD-10-CM | POA: Diagnosis not present

## 2014-08-26 DIAGNOSIS — Z96653 Presence of artificial knee joint, bilateral: Secondary | ICD-10-CM | POA: Diagnosis not present

## 2014-08-26 DIAGNOSIS — M7631 Iliotibial band syndrome, right leg: Secondary | ICD-10-CM | POA: Diagnosis not present

## 2014-08-26 DIAGNOSIS — F331 Major depressive disorder, recurrent, moderate: Secondary | ICD-10-CM | POA: Diagnosis not present

## 2014-08-26 DIAGNOSIS — E039 Hypothyroidism, unspecified: Secondary | ICD-10-CM | POA: Diagnosis not present

## 2014-08-26 DIAGNOSIS — Z96651 Presence of right artificial knee joint: Secondary | ICD-10-CM | POA: Diagnosis not present

## 2014-08-26 DIAGNOSIS — Z471 Aftercare following joint replacement surgery: Secondary | ICD-10-CM | POA: Diagnosis not present

## 2014-08-26 DIAGNOSIS — M25561 Pain in right knee: Secondary | ICD-10-CM | POA: Diagnosis not present

## 2014-08-28 DIAGNOSIS — H40033 Anatomical narrow angle, bilateral: Secondary | ICD-10-CM | POA: Diagnosis not present

## 2014-08-28 DIAGNOSIS — H2513 Age-related nuclear cataract, bilateral: Secondary | ICD-10-CM | POA: Diagnosis not present

## 2014-08-31 DIAGNOSIS — S0501XA Injury of conjunctiva and corneal abrasion without foreign body, right eye, initial encounter: Secondary | ICD-10-CM | POA: Diagnosis not present

## 2014-08-31 DIAGNOSIS — H578 Other specified disorders of eye and adnexa: Secondary | ICD-10-CM | POA: Diagnosis not present

## 2014-09-14 DIAGNOSIS — R319 Hematuria, unspecified: Secondary | ICD-10-CM | POA: Diagnosis not present

## 2014-09-14 DIAGNOSIS — R1030 Lower abdominal pain, unspecified: Secondary | ICD-10-CM | POA: Diagnosis not present

## 2014-09-14 DIAGNOSIS — N390 Urinary tract infection, site not specified: Secondary | ICD-10-CM | POA: Diagnosis not present

## 2014-09-14 DIAGNOSIS — R300 Dysuria: Secondary | ICD-10-CM | POA: Diagnosis not present

## 2014-09-17 ENCOUNTER — Ambulatory Visit: Payer: Self-pay | Admitting: Gastroenterology

## 2014-10-29 DIAGNOSIS — F319 Bipolar disorder, unspecified: Secondary | ICD-10-CM | POA: Diagnosis not present

## 2014-11-03 ENCOUNTER — Ambulatory Visit (HOSPITAL_COMMUNITY): Payer: Self-pay | Admitting: Psychiatry

## 2014-11-16 ENCOUNTER — Other Ambulatory Visit: Payer: Self-pay | Admitting: Family Medicine

## 2014-11-16 ENCOUNTER — Telehealth: Payer: Self-pay | Admitting: Nurse Practitioner

## 2014-11-16 DIAGNOSIS — E039 Hypothyroidism, unspecified: Secondary | ICD-10-CM

## 2014-11-16 MED ORDER — LEVOTHYROXINE SODIUM 112 MCG PO TABS: 112.0000 ug | ORAL_TABLET | Freq: Every day | ORAL | Status: DC

## 2014-11-16 NOTE — Telephone Encounter (Signed)
i sent 90 days on thyroid med. pls sched OV within next 3 mos.

## 2014-11-16 NOTE — Telephone Encounter (Signed)
Refill request for levothyroxine.  PCP states none but you saw her a few times.  Last OV 04/01/14.  Please advise.

## 2014-11-16 NOTE — Telephone Encounter (Signed)
Pt's sister answered phone.  She will have patient CB.  No one is on DPR.

## 2014-11-18 NOTE — Telephone Encounter (Signed)
LM w/ patient's sister.  She is to tell pt to CB.

## 2014-12-29 ENCOUNTER — Other Ambulatory Visit: Payer: Self-pay

## 2014-12-29 DIAGNOSIS — Z1231 Encounter for screening mammogram for malignant neoplasm of breast: Secondary | ICD-10-CM

## 2015-01-13 ENCOUNTER — Encounter: Payer: Self-pay | Admitting: Gastroenterology

## 2015-01-13 ENCOUNTER — Ambulatory Visit: Payer: Self-pay | Admitting: Gastroenterology

## 2015-01-13 ENCOUNTER — Telehealth: Payer: Self-pay | Admitting: Gastroenterology

## 2015-01-13 NOTE — Telephone Encounter (Signed)
PATIENT WAS A NO SHOW AND LETTER SENT  °

## 2015-01-16 ENCOUNTER — Emergency Department (HOSPITAL_COMMUNITY)
Admission: EM | Admit: 2015-01-16 | Discharge: 2015-01-17 | Disposition: A | Discharge status: Other Acute Inpatient Hospital | Payer: Medicare Other | Attending: Emergency Medicine | Admitting: Emergency Medicine

## 2015-01-16 ENCOUNTER — Encounter (HOSPITAL_COMMUNITY): Payer: Self-pay | Admitting: Emergency Medicine

## 2015-01-16 DIAGNOSIS — F419 Anxiety disorder, unspecified: Secondary | ICD-10-CM | POA: Diagnosis not present

## 2015-01-16 DIAGNOSIS — E785 Hyperlipidemia, unspecified: Secondary | ICD-10-CM | POA: Diagnosis not present

## 2015-01-16 DIAGNOSIS — E039 Hypothyroidism, unspecified: Secondary | ICD-10-CM | POA: Insufficient documentation

## 2015-01-16 DIAGNOSIS — I10 Essential (primary) hypertension: Secondary | ICD-10-CM | POA: Insufficient documentation

## 2015-01-16 DIAGNOSIS — Z872 Personal history of diseases of the skin and subcutaneous tissue: Secondary | ICD-10-CM | POA: Insufficient documentation

## 2015-01-16 DIAGNOSIS — J449 Chronic obstructive pulmonary disease, unspecified: Secondary | ICD-10-CM | POA: Insufficient documentation

## 2015-01-16 DIAGNOSIS — R45851 Suicidal ideations: Secondary | ICD-10-CM | POA: Diagnosis not present

## 2015-01-16 DIAGNOSIS — I2510 Atherosclerotic heart disease of native coronary artery without angina pectoris: Secondary | ICD-10-CM | POA: Diagnosis not present

## 2015-01-16 DIAGNOSIS — G8929 Other chronic pain: Secondary | ICD-10-CM | POA: Insufficient documentation

## 2015-01-16 DIAGNOSIS — G4733 Obstructive sleep apnea (adult) (pediatric): Secondary | ICD-10-CM | POA: Diagnosis not present

## 2015-01-16 DIAGNOSIS — Z9981 Dependence on supplemental oxygen: Secondary | ICD-10-CM | POA: Diagnosis not present

## 2015-01-16 DIAGNOSIS — Z79899 Other long term (current) drug therapy: Secondary | ICD-10-CM | POA: Diagnosis not present

## 2015-01-16 DIAGNOSIS — I252 Old myocardial infarction: Secondary | ICD-10-CM | POA: Insufficient documentation

## 2015-01-16 DIAGNOSIS — F329 Major depressive disorder, single episode, unspecified: Secondary | ICD-10-CM | POA: Diagnosis not present

## 2015-01-16 DIAGNOSIS — K219 Gastro-esophageal reflux disease without esophagitis: Secondary | ICD-10-CM | POA: Insufficient documentation

## 2015-01-16 DIAGNOSIS — R4589 Other symptoms and signs involving emotional state: Secondary | ICD-10-CM

## 2015-01-16 DIAGNOSIS — R4689 Other symptoms and signs involving appearance and behavior: Secondary | ICD-10-CM

## 2015-01-16 DIAGNOSIS — G43909 Migraine, unspecified, not intractable, without status migrainosus: Secondary | ICD-10-CM | POA: Insufficient documentation

## 2015-01-16 LAB — COMPREHENSIVE METABOLIC PANEL
ALK PHOS: 57 U/L (ref 38–126)
ALT: 10 U/L — AB (ref 14–54)
AST: 16 U/L (ref 15–41)
Albumin: 3.9 g/dL (ref 3.5–5.0)
Anion gap: 7 (ref 5–15)
BUN: 11 mg/dL (ref 6–20)
CHLORIDE: 104 mmol/L (ref 101–111)
CO2: 29 mmol/L (ref 22–32)
CREATININE: 0.88 mg/dL (ref 0.44–1.00)
Calcium: 9.3 mg/dL (ref 8.9–10.3)
GFR calc Af Amer: >60 mL/min (ref >60)
GFR calc non Af Amer: >60 mL/min (ref >60)
Glucose, Bld: 100 mg/dL — ABNORMAL HIGH (ref 65–99)
Potassium: 3.9 mmol/L (ref 3.5–5.1)
SODIUM: 140 mmol/L (ref 135–145)
Total Bilirubin: 0.5 mg/dL (ref 0.3–1.2)
Total Protein: 6.8 g/dL (ref 6.5–8.1)

## 2015-01-16 LAB — CBC WITH DIFFERENTIAL/PLATELET
BASOS ABS: 0 10*3/uL (ref 0.0–0.1)
Basophils Relative: 0 % (ref 0–1)
EOS ABS: 0.2 10*3/uL (ref 0.0–0.7)
EOS PCT: 2 % (ref 0–5)
HCT: 41.3 % (ref 36.0–46.0)
HEMOGLOBIN: 14 g/dL (ref 12.0–15.0)
LYMPHS PCT: 42 % (ref 12–46)
Lymphs Abs: 2.6 10*3/uL (ref 0.7–4.0)
MCH: 31.2 pg (ref 26.0–34.0)
MCHC: 33.9 g/dL (ref 30.0–36.0)
MCV: 92 fL (ref 78.0–100.0)
Monocytes Absolute: 0.6 10*3/uL (ref 0.1–1.0)
Monocytes Relative: 10 % (ref 3–12)
NEUTROS PCT: 46 % (ref 43–77)
Neutro Abs: 2.8 10*3/uL (ref 1.7–7.7)
PLATELETS: 87 10*3/uL — AB (ref 150–400)
RBC: 4.49 MIL/uL (ref 3.87–5.11)
RDW: 13.1 % (ref 11.5–15.5)
WBC: 6.2 10*3/uL (ref 4.0–10.5)

## 2015-01-16 LAB — URINALYSIS, ROUTINE W REFLEX MICROSCOPIC
BILIRUBIN URINE: NEGATIVE
GLUCOSE, UA: NEGATIVE mg/dL
HGB URINE DIPSTICK: NEGATIVE
KETONES UR: NEGATIVE mg/dL
Nitrite: NEGATIVE
PROTEIN: 30 mg/dL — AB
Specific Gravity, Urine: 1.037 — ABNORMAL HIGH (ref 1.005–1.030)
Urobilinogen, UA: 1 mg/dL (ref 0.0–1.0)
pH: 8 (ref 5.0–8.0)

## 2015-01-16 LAB — RAPID URINE DRUG SCREEN, HOSP PERFORMED
AMPHETAMINES: NOT DETECTED
BARBITURATES: NOT DETECTED
BENZODIAZEPINES: NOT DETECTED
Cocaine: NOT DETECTED
Opiates: NOT DETECTED
TETRAHYDROCANNABINOL: NOT DETECTED

## 2015-01-16 LAB — URINE MICROSCOPIC-ADD ON

## 2015-01-16 LAB — ETHANOL: Alcohol, Ethyl (B): <5 mg/dL (ref <5)

## 2015-01-16 LAB — VALPROIC ACID LEVEL: Valproic Acid Lvl: 93 ug/mL (ref 50.0–100.0)

## 2015-01-16 MED ORDER — DIVALPROEX SODIUM ER 500 MG PO TB24
1500.0000 mg | ORAL_TABLET | Freq: Every day | ORAL | Status: DC
  Administered 2015-01-16: 1500 mg via ORAL
  Filled 2015-01-16 (×2): qty 3

## 2015-01-16 MED ORDER — LEVOTHYROXINE SODIUM 112 MCG PO TABS
112.0000 ug | ORAL_TABLET | Freq: Every day | ORAL | Status: DC
  Administered 2015-01-17: 112 ug via ORAL
  Filled 2015-01-16 (×2): qty 1

## 2015-01-16 MED ORDER — ALUM & MAG HYDROXIDE-SIMETH 200-200-20 MG/5ML PO SUSP: 30.0000 mL | ORAL | Status: DC | PRN

## 2015-01-16 MED ORDER — TRAZODONE HCL 100 MG PO TABS
200.0000 mg | ORAL_TABLET | Freq: Every day | ORAL | Status: DC
  Administered 2015-01-16: 200 mg via ORAL
  Filled 2015-01-16: qty 2

## 2015-01-16 MED ORDER — PANTOPRAZOLE SODIUM 40 MG PO TBEC
40.0000 mg | DELAYED_RELEASE_TABLET | Freq: Every day | ORAL | Status: DC
  Administered 2015-01-16 – 2015-01-17 (×2): 40 mg via ORAL
  Filled 2015-01-16 (×2): qty 1

## 2015-01-16 MED ORDER — DULOXETINE HCL 60 MG PO CPEP
60.0000 mg | ORAL_CAPSULE | Freq: Two times a day (BID) | ORAL | Status: DC
  Administered 2015-01-16 – 2015-01-17 (×2): 60 mg via ORAL
  Filled 2015-01-16 (×3): qty 1

## 2015-01-16 MED ORDER — CLONIDINE HCL 0.1 MG PO TABS
0.2000 mg | ORAL_TABLET | Freq: Every day | ORAL | Status: DC
  Administered 2015-01-16: 0.2 mg via ORAL
  Filled 2015-01-16: qty 2

## 2015-01-16 MED ORDER — ONDANSETRON HCL 4 MG PO TABS: 4.0000 mg | ORAL_TABLET | Freq: Three times a day (TID) | ORAL | Status: DC | PRN

## 2015-01-16 MED ORDER — DIVALPROEX SODIUM ER 500 MG PO TB24: 1500.0000 mg | ORAL_TABLET | Freq: Every evening | ORAL | Status: DC | PRN

## 2015-01-16 MED ORDER — ALBUTEROL SULFATE HFA 108 (90 BASE) MCG/ACT IN AERS: 2.0000 | INHALATION_SPRAY | Freq: Four times a day (QID) | RESPIRATORY_TRACT | Status: DC | PRN

## 2015-01-16 MED ORDER — ACETAMINOPHEN 325 MG PO TABS
650.0000 mg | ORAL_TABLET | ORAL | Status: DC | PRN
  Administered 2015-01-17: 650 mg via ORAL
  Filled 2015-01-16: qty 2

## 2015-01-16 NOTE — ED Notes (Signed)
Pt states she has not used CPAP x 2 years

## 2015-01-16 NOTE — ED Notes (Signed)
Pt presents with family with c/o hearing voices x 1 week, no commands, pt describes  them as "background noise"Pt endorses SI with plan to overdose on mediation as she did in the past.

## 2015-01-16 NOTE — ED Provider Notes (Addendum)
CSN: [id number]     Arrival date & time 01/16/15  1854 History   First MD Initiated Contact with Patient 01/16/15 2020     Chief Complaint  Patient presents with  . Suicidal     (Consider location/radiation/quality/duration/timing/severity/associated sxs/prior Treatment) HPI Comments: Patient here complaining of increasing suicidal ideations with plan to overdose on medication. History of suicide attempt in the past. Denies any current ingestions. No command hallucinations. No homicidal ideations. Has been compliant with her medications for her depression. Symptoms have been persistent and nothing makes them better or worse.  The history is provided by the patient.    Past Medical History  Diagnosis Date  . Hyperlipidemia   . CAD (coronary artery disease)   . Bipolar 1 disorder   . GERD (gastroesophageal reflux disease)   . Hyperthyroidism   . Chronic back pain   . Dyspnea     PFT 03/05/09 FEV1 2.77(98%), FVC 3.25(86%), FEV1% 85, TLC 5.88(99%), DLCO 60% ,  Methacholine challenge 03/16/09 normal ,  CT chest 03/12/09 no pulmonary disease  . Anxiety   . Arthritis   . Depression   . OSA on CPAP     2 liters  . HTN (hypertension)   . History of colonoscopy 10/17/2002    by Dr Rehman-> distal non-specific proctitis, small ext hemorrhoids,   . Fungal infection   . Cellulitis   . Contusion of sacrum   . Migraine headache   . Vitamin D deficiency   . Sleep apnea   . Myocardial infarction     NOV 1997  . Hypothyroidism     States she only has hyperthyroidism  . Complication of anesthesia     States she typically gets sick s/p anesthesia  . Morbid obesity with body mass index of 50.0-59.9 in adult JAN 2011 370 LBS    2004 311 BMI 45.9  . Obsessive-compulsive disorder   . PTSD (post-traumatic stress disorder)   . Asthma   . Allergy   . Urine incontinence   . Chronic headaches   . Suicidal ideation   . COPD (chronic obstructive pulmonary disease)   . Schizoaffective disorder,  bipolar type    Past Surgical History  Procedure Laterality Date  . Cholecystectomy    . Knee arthroscopy    . Appendectomy    . Tonsillectomy    . Back surgery  2008  . Total vaginal hysterectomy    . Abdominal hysterectomy      sept 1996  . Tubal ligation    . Colonoscopy  10/17/2002     Distal proctitis, small external hemorrhoids, otherwise/  normal colonoscopy. Suspect rectal bleeding secondary to hemorrhoids  . Esophagogastroduodenoscopy  03/18/09    fundic gland polyps/mild gastritis  . Cardiac catheterization      nov 1997  . Joint replacement      bil knee replacement  . Hernia repair  1978   Family History  Problem Relation Age of Onset  . Cancer Mother   . Hypertension Mother   . Bipolar disorder Mother   . Dementia Mother   . Depression Mother   . Coronary artery disease Father   . Alcohol abuse Father   . Hypertension Brother   . Coronary artery disease Brother   . Bipolar disorder Brother   . Depression Brother   . Anesthesia problems Neg Hx   . Hypotension Neg Hx   . Malignant hyperthermia Neg Hx   . Pseudochol deficiency Neg Hx   . Depression Sister   .  Paranoid behavior Sister   . Bipolar disorder Sister   . Depression Sister   . Hypertension Sister   . Cancer Son     thyroid   Social History  Substance Use Topics  . Smoking status: Never Smoker   . Smokeless tobacco: Never Used  . Alcohol Use: No   OB History    No data available     Review of Systems  All other systems reviewed and are negative.     Allergies  Haldol; Ativan; and Naproxen  Home Medications   Prior to Admission medications   Medication Sig Start Date End Date Taking? Authorizing Provider  albuterol (PROVENTIL HFA;VENTOLIN HFA) 108 (90 BASE) MCG/ACT inhaler Inhale 2 puffs into the lungs every 6 (six) hours as needed for wheezing or shortness of breath. 12/25/13 02/03/15 Yes Irene Pap, NP  cloNIDine (CATAPRES) 0.2 MG tablet Take 0.2 mg by mouth at bedtime.  12/28/14  Yes Historical Provider, MD  divalproex (DEPAKOTE ER) 500 MG 24 hr tablet Take 1 tablet (500 mg total) by mouth 3 (three) times daily. On occasions may take one during the day for extreme stress. Patient taking differently: Take 1,500 mg by mouth at bedtime and may repeat dose one time if needed.  02/24/14  Yes Irene Pap, NP  DULoxetine (CYMBALTA) 60 MG capsule Take 1 capsule (60 mg total) by mouth 2 (two) times daily. 01/16/14  Yes Tammi Sou, MD  levothyroxine (SYNTHROID, LEVOTHROID) 112 MCG tablet Take 1 tablet (112 mcg total) by mouth daily before breakfast. For hypothyroidism. 11/16/14 11/16/15 Yes Irene Pap, NP  Melatonin 10 MG CAPS Take 10 mg by mouth at bedtime.   Yes Historical Provider, MD  omeprazole (PRILOSEC) 20 MG capsule Take 1 capsule (20 mg total) by mouth daily. 02/24/14  Yes Irene Pap, NP  traZODone (DESYREL) 100 MG tablet Take 1 tablet (100 mg total) by mouth at bedtime. Patient taking differently: Take 200 mg by mouth at bedtime.  12/10/13  Yes Irene Pap, NP  bisacodyl (DULCOLAX) 10 MG suppository Place 1 suppository (10 mg total) rectally as needed for moderate constipation. Patient not taking: Reported on 01/16/2015 01/07/14   Irene Pap, NP  celecoxib (CELEBREX) 200 MG capsule Take 1 capsule (200 mg total) by mouth 2 (two) times daily. Patient not taking: Reported on 01/16/2015 04/01/14   Irene Pap, NP  Clotrimazole 1 % OINT Apply sparingly twice daily to rash. Patient not taking: Reported on 01/16/2015 06/12/14   Irene Pap, NP  Multiple Vitamin (MULTIVITAMIN WITH MINERALS) TABS Take 1 tablet by mouth daily. For nutritional supplementation. Patient not taking: Reported on 01/16/2015 10/31/12   Niel Hummer, NP  verapamil (CALAN-SR) 180 MG CR tablet Take 1 tablet (180 mg total) by mouth every morning. Patient not taking: Reported on 01/16/2015 02/24/14   Irene Pap, NP   BP 143/90 mmHg  Pulse 76  Temp(Src) 98.2 F (36.8 C) (Oral)   Resp 23  Ht 5\' 9"  (1.753 m)  Wt 350 lb (158.759 kg)  BMI 51.66 kg/m2  SpO2 96%  LMP 02/18/1995 Physical Exam  Constitutional: She is oriented to person, place, and time. She appears well-developed and well-nourished.  Non-toxic appearance. No distress.  HENT:  Head: Normocephalic and atraumatic.  Eyes: Conjunctivae, EOM and lids are normal. Pupils are equal, round, and reactive to light.  Neck: Normal range of motion. Neck supple. No tracheal deviation present. No thyroid mass present.  Cardiovascular: Normal rate,  regular rhythm and normal heart sounds.  Exam reveals no gallop.   No murmur heard. Pulmonary/Chest: Effort normal and breath sounds normal. No stridor. No respiratory distress. She has no decreased breath sounds. She has no wheezes. She has no rhonchi. She has no rales.  Abdominal: Soft. Normal appearance and bowel sounds are normal. She exhibits no distension. There is no tenderness. There is no rebound and no CVA tenderness.  Musculoskeletal: Normal range of motion. She exhibits no edema or tenderness.  Neurological: She is alert and oriented to person, place, and time. She has normal strength. No cranial nerve deficit or sensory deficit. GCS eye subscore is 4. GCS verbal subscore is 5. GCS motor subscore is 6.  Skin: Skin is warm and dry. No abrasion and no rash noted.  Psychiatric: Her speech is normal. Her affect is blunt. She is slowed. She expresses suicidal ideation. She expresses suicidal plans. She expresses no homicidal plans.  Nursing note and vitals reviewed.   ED Course  Procedures (including critical care time) Labs Review Labs Reviewed  CBC WITH DIFFERENTIAL/PLATELET  COMPREHENSIVE METABOLIC PANEL  VALPROIC ACID LEVEL  ETHANOL  URINE RAPID DRUG SCREEN, HOSP PERFORMED    Imaging Review No results found. I have personally reviewed and evaluated these images and lab results as part of my medical decision-making.   EKG Interpretation None      MDM    Final diagnoses:  None   Pt to medically cleared and then seen by psych for placement    Lacretia Leigh, MD 01/16/15 2046  Lacretia Leigh, MD 03/08/15 1302

## 2015-01-16 NOTE — ED Notes (Signed)
Please update niece [name] at [phone] anytime after 0800 hrs, sister Mardene Celeste [phone] at anytime.

## 2015-01-16 NOTE — BH Assessment (Signed)
Assessment completed. Consulted Serena Colonel, NP who recommended inpatient treatment.

## 2015-01-16 NOTE — ED Notes (Signed)
Staffing notified of need for sitter.  

## 2015-01-16 NOTE — BH Assessment (Addendum)
Tele Assessment Note   Allison Sharp is an 59 y.o. female presenting to [facility] endorsing suicidal ideations with a plan to overdose with her medications. Pt stated "I have just reached that point of not being able to deal". "I have been hearing voices for the past two weeks". "I knew I needed to come and get help". "I'm just as low as I have ever been in my numerous days at [facility]". Pt reported that she has attempted suicide in the past and has been hospitalized as well. PT is currently not receiving any mental health treatment but she reported that she would like to have access to a psychiatrist and a therapist. PT is endorsing multiple depressive symptoms and shared that her sleep and appetite are poor.  Pt also reported that she is experiencing auditory hallucinations which she referred to as "background noise". Pt did not report alcohol or illicit drug abuse. Pt denies having access to weapon or firearms at this time. PT did not report any pending criminal charges or upcoming court dates. Pt denied any physical, sexual or emotional abuse. Inpatient treatment is recommended.   Axis I: Major Depression, Recurrent severe  Past Medical History:  Past Medical History  Diagnosis Date  . Hyperlipidemia   . CAD (coronary artery disease)   . Bipolar 1 disorder   . GERD (gastroesophageal reflux disease)   . Hyperthyroidism   . Chronic back pain   . Dyspnea     PFT 03/05/09 FEV1 2.77(98%), FVC 3.25(86%), FEV1% 85, TLC 5.88(99%), DLCO 60% ,  Methacholine challenge 03/16/09 normal ,  CT chest 03/12/09 no pulmonary disease  . Anxiety   . Arthritis   . Depression   . OSA on CPAP     2 liters  . HTN (hypertension)   . History of colonoscopy 10/17/2002    by Dr Rehman-> distal non-specific proctitis, small ext hemorrhoids,   . Fungal infection   . Cellulitis   . Contusion of sacrum   . Migraine headache   . Vitamin D deficiency   . Sleep apnea   . Myocardial infarction     NOV 1997  . Hypothyroidism      States she only has hyperthyroidism  . Complication of anesthesia     States she typically gets sick s/p anesthesia  . Morbid obesity with body mass index of 50.0-59.9 in adult JAN 2011 370 LBS    2004 311 BMI 45.9  . Obsessive-compulsive disorder   . PTSD (post-traumatic stress disorder)   . Asthma   . Allergy   . Urine incontinence   . Chronic headaches   . Suicidal ideation   . COPD (chronic obstructive pulmonary disease)   . Schizoaffective disorder, bipolar type     Past Surgical History  Procedure Laterality Date  . Cholecystectomy    . Knee arthroscopy    . Appendectomy    . Tonsillectomy    . Back surgery  2008  . Total vaginal hysterectomy    . Abdominal hysterectomy      sept 1996  . Tubal ligation    . Colonoscopy  10/17/2002     Distal proctitis, small external hemorrhoids, otherwise/  normal colonoscopy. Suspect rectal bleeding secondary to hemorrhoids  . Esophagogastroduodenoscopy  03/18/09    fundic gland polyps/mild gastritis  . Cardiac catheterization      nov 1997  . Joint replacement      bil knee replacement  . Hernia repair  1978    Family History:  Family History  Problem Relation Age of Onset  . Cancer Mother   . Hypertension Mother   . Bipolar disorder Mother   . Dementia Mother   . Depression Mother   . Coronary artery disease Father   . Alcohol abuse Father   . Hypertension Brother   . Coronary artery disease Brother   . Bipolar disorder Brother   . Depression Brother   . Anesthesia problems Neg Hx   . Hypotension Neg Hx   . Malignant hyperthermia Neg Hx   . Pseudochol deficiency Neg Hx   . Depression Sister   . Paranoid behavior Sister   . Bipolar disorder Sister   . Depression Sister   . Hypertension Sister   . Cancer Son     thyroid    Social History:  reports that she has never smoked. She has never used smokeless tobacco. She reports that she does not drink alcohol or use illicit drugs.  Additional Social History:   Alcohol / Drug Use History of alcohol / drug use?: No history of alcohol / drug abuse  CIWA: CIWA-Ar BP: 143/90 mmHg Pulse Rate: 76 COWS:    PATIENT STRENGTHS: (choose at least two) Average or above average intelligence Motivation for treatment/growth Supportive family/friends  Allergies:  Allergies  Allergen Reactions  . Haldol [Haloperidol] Other (See Comments)    Hallucinating   . Ativan [Lorazepam] Other (See Comments)    Delirium  . Naproxen Nausea And Vomiting    Home Medications:  (Not in a hospital admission)  OB/GYN Status:  Patient's last menstrual period was 02/18/1995.  General Assessment Data Location of Assessment: [facility] TTS Assessment: In system Is this a Tele or Face-to-Face Assessment?: Face-to-Face Is this an Initial Assessment or a Re-assessment for this encounter?: Initial Assessment Marital status: Single Living Arrangements: Other relatives Can pt return to current living arrangement?: Yes Admission Status: Voluntary Is patient capable of signing voluntary admission?: Yes Referral Source: Self/Family/Friend Insurance type: [place] Living Arrangements: Other relatives Name of Psychiatrist: No provider reported at this time  Name of Therapist: No provider reported at this time.   Education Status Is patient currently in school?: No Current Grade: N/A Highest grade of school patient has completed: N/A Name of school: N/A Contact person: N/A  Risk to self with the past 6 months Suicidal Ideation: Yes-Currently Present Has patient been a risk to self within the past 6 months prior to admission? : No Suicidal Intent: Yes-Currently Present Has patient had any suicidal intent within the past 6 months prior to admission? : No Is patient at risk for suicide?: Yes Suicidal Plan?: Yes-Currently Present Has patient had any suicidal plan within the past 6 months prior to admission? : No Specify Current Suicidal Plan:  "Overdose on pills"  Access to Means: Yes Specify Access to Suicidal Means: Pt reported that she has a variety of prescriptions.  What has been your use of drugs/alcohol within the last 12 months?: No alcohol or drug use reported.  Previous Attempts/Gestures: Yes How many times?: 1 Other Self Harm Risks: No other self harm risk reported.  Triggers for Past Attempts: Other (Comment) (Death of a friend ) Intentional Self Injurious Behavior: None Family Suicide History: No Recent stressful life event(s): Other (Comment) (Moving, conflict with older sister and mental health ) Persecutory voices/beliefs?: No Depression: Yes Depression Symptoms: Insomnia, Tearfulness, Despondent, Fatigue, Isolating, Loss of interest in usual pleasures, Guilt, Feeling angry/irritable Substance abuse history and/or treatment  for substance abuse?: No Suicide prevention information given to non-admitted patients: Not applicable  Risk to Others within the past 6 months Homicidal Ideation: No Does patient have any lifetime risk of violence toward others beyond the six months prior to admission? : No Thoughts of Harm to Others: No Current Homicidal Intent: No Current Homicidal Plan: No Access to Homicidal Means: No Identified Victim: N/A History of harm to others?: No Assessment of Violence: On admission Violent Behavior Description: No violent behaviors observed. Pt is calm and cooperative at this time.  Does patient have access to weapons?: No Criminal Charges Pending?: No Does patient have a court date: No Is patient on probation?: No  Psychosis Hallucinations: Auditory Delusions: None noted  Mental Status Report Appearance/Hygiene: In hospital gown Eye Contact: Good Motor Activity: Unable to assess Speech: Logical/coherent Level of Consciousness: Alert Mood: Depressed, Sad Affect: Appropriate to circumstance Anxiety Level: Moderate Thought Processes: Coherent, Relevant Judgement:  Unimpaired Orientation: Person, Place, Time, Situation Obsessive Compulsive Thoughts/Behaviors: None  Cognitive Functioning Concentration: Decreased Memory: Recent Intact, Remote Intact IQ: Average Insight: Fair Impulse Control: Fair Appetite: Poor Weight Loss:  (Unknown ) Weight Gain: 0 Sleep: Decreased Total Hours of Sleep: 4 Vegetative Symptoms: None  ADLScreening [facility] Assessment Services) Patient's cognitive ability adequate to safely complete daily activities?: Yes Patient able to express need for assistance with ADLs?: Yes Independently performs ADLs?: Yes (appropriate for developmental age)  Prior Inpatient Therapy Prior Inpatient Therapy: Yes Prior Therapy Dates: 2014 Prior Therapy Facilty[facility]Provider([facility]): [facility]  Reason for Treatment: MDD  Prior Outpatient Therapy Prior Outpatient Therapy: Yes Prior Therapy Dates: 2015 Prior Therapy Facilty/Provider(s): Dr. Reather Converse  Reason for Treatment: MDD Does patient have an ACCT team?: No Does patient have Intensive In-House Services?  : No Does patient have [facility] services? : No Does patient have P4CC services?: No  ADL Screening (condition at time of admission) Patient's cognitive ability adequate to safely complete daily activities?: Yes Patient able to express need for assistance with ADLs?: Yes Independently performs ADLs?: Yes (appropriate for developmental age)       Abuse/Neglect Assessment (Assessment to be complete while patient is alone) Physical Abuse: Denies Verbal Abuse: Denies Sexual Abuse: Denies Exploitation of patient/patient's resources: Denies Self-Neglect: Denies     Regulatory affairs officer (For Healthcare) Does patient have an advance directive?: No Would patient like information on creating an advanced directive?: No - patient declined information    Additional Information 1:1 In Past 12 Months?: No CIRT Risk: No Elopement Risk: No Does patient have medical clearance?: Yes     Disposition:   Disposition Initial Assessment Completed for this Encounter: Yes  Trevan Messman S 01/16/2015 9:49 PM

## 2015-01-17 ENCOUNTER — Inpatient Hospital Stay (HOSPITAL_COMMUNITY)
Admission: AD | Admit: 2015-01-17 | Discharge: 2015-01-23 | DRG: 885 | Disposition: A | Discharge status: Home / Self Care | Payer: 59 | Source: Intra-hospital | Attending: Psychiatry | Admitting: Psychiatry

## 2015-01-17 ENCOUNTER — Encounter (HOSPITAL_COMMUNITY): Payer: Self-pay

## 2015-01-17 DIAGNOSIS — G4700 Insomnia, unspecified: Secondary | ICD-10-CM | POA: Diagnosis present

## 2015-01-17 DIAGNOSIS — I2510 Atherosclerotic heart disease of native coronary artery without angina pectoris: Secondary | ICD-10-CM | POA: Diagnosis not present

## 2015-01-17 DIAGNOSIS — Z6841 Body Mass Index (BMI) 40.0 and over, adult: Secondary | ICD-10-CM | POA: Diagnosis not present

## 2015-01-17 DIAGNOSIS — Z79899 Other long term (current) drug therapy: Secondary | ICD-10-CM | POA: Diagnosis not present

## 2015-01-17 DIAGNOSIS — I10 Essential (primary) hypertension: Secondary | ICD-10-CM | POA: Diagnosis present

## 2015-01-17 DIAGNOSIS — F323 Major depressive disorder, single episode, severe with psychotic features: Principal | ICD-10-CM | POA: Diagnosis present

## 2015-01-17 DIAGNOSIS — Z872 Personal history of diseases of the skin and subcutaneous tissue: Secondary | ICD-10-CM | POA: Diagnosis not present

## 2015-01-17 DIAGNOSIS — Z96653 Presence of artificial knee joint, bilateral: Secondary | ICD-10-CM | POA: Diagnosis present

## 2015-01-17 DIAGNOSIS — D682 Hereditary deficiency of other clotting factors: Secondary | ICD-10-CM | POA: Clinically undetermined

## 2015-01-17 DIAGNOSIS — R45851 Suicidal ideations: Secondary | ICD-10-CM | POA: Diagnosis not present

## 2015-01-17 DIAGNOSIS — K219 Gastro-esophageal reflux disease without esophagitis: Secondary | ICD-10-CM | POA: Diagnosis not present

## 2015-01-17 DIAGNOSIS — F332 Major depressive disorder, recurrent severe without psychotic features: Secondary | ICD-10-CM | POA: Diagnosis not present

## 2015-01-17 DIAGNOSIS — G8929 Other chronic pain: Secondary | ICD-10-CM | POA: Diagnosis not present

## 2015-01-17 DIAGNOSIS — G4733 Obstructive sleep apnea (adult) (pediatric): Secondary | ICD-10-CM | POA: Diagnosis not present

## 2015-01-17 DIAGNOSIS — F329 Major depressive disorder, single episode, unspecified: Secondary | ICD-10-CM | POA: Diagnosis not present

## 2015-01-17 DIAGNOSIS — I252 Old myocardial infarction: Secondary | ICD-10-CM | POA: Diagnosis not present

## 2015-01-17 DIAGNOSIS — E039 Hypothyroidism, unspecified: Secondary | ICD-10-CM | POA: Diagnosis not present

## 2015-01-17 DIAGNOSIS — J449 Chronic obstructive pulmonary disease, unspecified: Secondary | ICD-10-CM | POA: Diagnosis present

## 2015-01-17 DIAGNOSIS — F4310 Post-traumatic stress disorder, unspecified: Secondary | ICD-10-CM | POA: Diagnosis not present

## 2015-01-17 DIAGNOSIS — G43909 Migraine, unspecified, not intractable, without status migrainosus: Secondary | ICD-10-CM | POA: Diagnosis not present

## 2015-01-17 DIAGNOSIS — E785 Hyperlipidemia, unspecified: Secondary | ICD-10-CM | POA: Diagnosis not present

## 2015-01-17 DIAGNOSIS — Z9981 Dependence on supplemental oxygen: Secondary | ICD-10-CM | POA: Diagnosis not present

## 2015-01-17 DIAGNOSIS — F419 Anxiety disorder, unspecified: Secondary | ICD-10-CM | POA: Diagnosis not present

## 2015-01-17 MED ORDER — DIVALPROEX SODIUM ER 500 MG PO TB24
1500.0000 mg | ORAL_TABLET | Freq: Every day | ORAL | Status: DC
  Administered 2015-01-17: 1500 mg via ORAL
  Filled 2015-01-17 (×4): qty 3

## 2015-01-17 MED ORDER — ALBUTEROL SULFATE HFA 108 (90 BASE) MCG/ACT IN AERS
2.0000 | INHALATION_SPRAY | Freq: Four times a day (QID) | RESPIRATORY_TRACT | Status: DC | PRN
  Administered 2015-01-18 – 2015-01-19 (×2): 2 via RESPIRATORY_TRACT
  Filled 2015-01-17 (×2): qty 6.7

## 2015-01-17 MED ORDER — DIAZEPAM 2 MG PO TABS
2.0000 mg | ORAL_TABLET | Freq: Once | ORAL | Status: AC
Start: 2015-01-17 — End: 2015-01-17
  Administered 2015-01-17: 2 mg via ORAL
  Filled 2015-01-17: qty 1

## 2015-01-17 MED ORDER — DULOXETINE HCL 60 MG PO CPEP
60.0000 mg | ORAL_CAPSULE | Freq: Two times a day (BID) | ORAL | Status: DC
  Administered 2015-01-17 – 2015-01-20 (×6): 60 mg via ORAL
  Filled 2015-01-17 (×12): qty 1

## 2015-01-17 MED ORDER — PANTOPRAZOLE SODIUM 40 MG PO TBEC
40.0000 mg | DELAYED_RELEASE_TABLET | Freq: Every day | ORAL | Status: DC
  Administered 2015-01-18 – 2015-01-23 (×6): 40 mg via ORAL
  Filled 2015-01-17 (×9): qty 1

## 2015-01-17 MED ORDER — LEVOTHYROXINE SODIUM 112 MCG PO TABS
112.0000 ug | ORAL_TABLET | Freq: Every day | ORAL | Status: DC
  Administered 2015-01-18 – 2015-01-23 (×6): 112 ug via ORAL
  Filled 2015-01-17 (×9): qty 1

## 2015-01-17 MED ORDER — CLONIDINE HCL 0.2 MG PO TABS
0.2000 mg | ORAL_TABLET | Freq: Every day | ORAL | Status: DC
  Administered 2015-01-17 – 2015-01-20 (×3): 0.2 mg via ORAL
  Filled 2015-01-17: qty 1
  Filled 2015-01-17: qty 2
  Filled 2015-01-17 (×4): qty 1

## 2015-01-17 MED ORDER — TRAZODONE HCL 100 MG PO TABS
200.0000 mg | ORAL_TABLET | Freq: Every day | ORAL | Status: DC
  Administered 2015-01-17 – 2015-01-18 (×2): 200 mg via ORAL
  Filled 2015-01-17 (×4): qty 2

## 2015-01-17 NOTE — BHH Counselor (Addendum)
[facility] Assessment Progress Note  Counselor reassessed pt this morning. She continues to endorse SI with a plan to take pills. She denies HI. She says that she continues to hear "background noises", but implies that she is able to tolerate the noise, as she is used to hearing it.  Consulted with Dr. Adele Schilder and Reginold Agent, NP. Pt continues to meet IP criteria. TTS will continue to seek placement.     Kenna Gilbert. Lovena Le, [place], [place], LPCA Counselor

## 2015-01-17 NOTE — ED Notes (Signed)
Pt resting quietly.  NAD noted.  Sitter at bedside

## 2015-01-17 NOTE — ED Notes (Signed)
Pt has safety sitter bedside for SI

## 2015-01-17 NOTE — ED Notes (Signed)
Pt given her personal bible from her belongings bag.  There is a 5x7 photo used as a Medical laboratory scientific officer.  RN encouraged her to leave that photo in her belongings bag but Pt asked for it to remain in her bible.

## 2015-01-17 NOTE — ED Notes (Addendum)
Pt has no complaints this morning.  She is curious as to what the plans are for her placement.  States "she slept well last night".  Glasses given to patient per her request

## 2015-01-17 NOTE — BHH Counselor (Signed)
[facility] Assessment Progress Note  Per Dr. Adele Schilder and Reginold Agent, NP, pt meets criteria for IP hospitalization. She has been accepted to [facility] 506-2. She can come after 830pm. Support paperwork has been signed and faxed to [facility]. Pt's nurse, Otho Perl, made aware.   Kenna Gilbert. Lovena Le, [place], [place], LPCA Counselor

## 2015-01-17 NOTE — ED Notes (Signed)
Pt awake.   Eating breakfast.  Sitter at bedside.  No needs at this time per patient.

## 2015-01-17 NOTE — ED Notes (Signed)
Pt given cheese stick

## 2015-01-17 NOTE — Tx Team (Signed)
Initial Interdisciplinary Treatment Plan   PATIENT STRESSORS: Health problems Marital or family conflict   PATIENT STRENGTHS: Ability for insight General fund of knowledge Motivation for treatment/growth Supportive family/friends   PROBLEM LIST: Problem List/Patient Goals Date to be addressed Date deferred Reason deferred Estimated date of resolution  Risk for suicide      Psychosis (AH)      "management skills to keep from getting overwhelmed"                                           DISCHARGE CRITERIA:  Improved stabilization in mood, thinking, and/or behavior Verbal commitment to aftercare and medication compliance  PRELIMINARY DISCHARGE PLAN: Attend aftercare/continuing care group Outpatient therapy  PATIENT/FAMIILY INVOLVEMENT: This treatment plan has been presented to and reviewed with the patient, Allison Sharp.  The patient and family have been given the opportunity to ask questions and make suggestions.  Vonzella Nipple A 01/17/2015, 11:48 PM
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
## 2015-01-17 NOTE — ED Notes (Signed)
Sitter at bedside.

## 2015-01-17 NOTE — ED Notes (Signed)
Safety sitter bedside with Pt due to SI

## 2015-01-17 NOTE — ED Notes (Signed)
Patient given coffee. 

## 2015-01-18 ENCOUNTER — Encounter (HOSPITAL_COMMUNITY): Payer: Self-pay | Admitting: Psychiatry

## 2015-01-18 DIAGNOSIS — F4310 Post-traumatic stress disorder, unspecified: Secondary | ICD-10-CM

## 2015-01-18 MED ORDER — CLOTRIMAZOLE 1 % EX SOLN
Freq: Two times a day (BID) | CUTANEOUS | Status: DC
  Filled 2015-01-18: qty 10

## 2015-01-18 MED ORDER — CLOTRIMAZOLE 1 % EX CREA
TOPICAL_CREAM | Freq: Once | CUTANEOUS | Status: AC
  Administered 2015-01-18: 12:00:00 via TOPICAL

## 2015-01-18 MED ORDER — CLOTRIMAZOLE 1 % EX CREA
TOPICAL_CREAM | Freq: Two times a day (BID) | CUTANEOUS | Status: DC
  Administered 2015-01-18 – 2015-01-19 (×3): via TOPICAL
  Filled 2015-01-18: qty 15

## 2015-01-18 MED ORDER — MELATONIN 10 MG PO CAPS: 10.0000 mg | ORAL_CAPSULE | Freq: Every day | ORAL | Status: DC

## 2015-01-18 MED ORDER — DIVALPROEX SODIUM ER 500 MG PO TB24
2000.0000 mg | ORAL_TABLET | Freq: Every day | ORAL | Status: DC
  Administered 2015-01-18: 2000 mg via ORAL
  Filled 2015-01-18 (×2): qty 4

## 2015-01-18 MED ORDER — DICLOFENAC SODIUM 1 % TD GEL
2.0000 g | Freq: Four times a day (QID) | TRANSDERMAL | Status: DC
  Administered 2015-01-18 – 2015-01-22 (×6): 2 g via TOPICAL
  Filled 2015-01-18 (×2): qty 100
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
## 2015-01-18 NOTE — Plan of Care (Signed)
Problem: Alteration in mood Goal: LTG-Patient reports reduction in suicidal thoughts (Patient reports reduction in suicidal thoughts and is able to verbalize a safety plan for whenever patient is feeling suicidal)  Outcome: Not Progressing Pt passive SI-contracts for safety

## 2015-01-18 NOTE — BHH Group Notes (Signed)
[facility] LCSW Aftercare Discharge Planning Group Note   01/18/2015 4:21 PM  Participation Quality:  Invited.  In bed.  Chose to not attend    [place], Barbaraann Rondo B

## 2015-01-18 NOTE — Progress Notes (Cosign Needed)
D) Pt sleepy and c/o pain this a.m. Did not attend a.m. Discharge planning group. However pt was oob shortly after and participated in recreation and group therapy. At times pt has been out in the milieu reading or watching t.v. Jeanny endorses auditory hallucinations although not command in nature. Pt c/o anxiety and depression and feeling "not too good about myself". Pt inquired about having her cane brought in from home. Writer relayed this information to M.D. Pt provided with a shower chair per order. A) Level 3 obs for safety, med ed reinforced, support and encouragement provided. Safety precautions reinforced. R) Cooperative.

## 2015-01-18 NOTE — Progress Notes (Signed)
PHARMACIST - PHYSICIAN ORDER COMMUNICATION  CONCERNING: P&T Medication Policy on Herbal Medications  DESCRIPTION:  This patient's order for:  Melatonin  has been noted.  This product(s) is classified as an "herbal" or natural product. Due to a lack of definitive safety studies or FDA approval, nonstandard manufacturing practices, plus the potential risk of unknown drug-drug interactions while on inpatient medications, the Pharmacy and Therapeutics Committee does not permit the use of "herbal" or natural products of this type within [facility].   ACTION TAKEN: Please reevaluate patient's clinical condition at discharge and address if the herbal or natural product(s) should be resumed at that time.  Netta Cedars, PharmD, BCPS Pager: [phone] 01/18/2015@3 :35 PM

## 2015-01-18 NOTE — H&P (Signed)
Psychiatric Admission Assessment Child/Adolescent  Patient Identification: Allison Sharp MRN:  [id number] Date of Evaluation:  01/18/2015 Chief Complaint:  MDD, RECURRENT,SEVERE WITH PSYCHOTIC FEATURES Principal Diagnosis: <principal problem not specified> Diagnosis:   Patient Active Problem List   Diagnosis Date Noted  . Acute cystitis with hematuria [N30.01] 04/01/2014  . Bilateral knee pain [M25.561, M25.562] 01/07/2014  . Scar condition and fibrosis of skin [L90.5] 12/11/2013  . Essential hypertension, benign [I10] 07/01/2013  . Acute respiratory failure [J96.00] 11/05/2012  . Asthma with acute exacerbation [J45.901] 11/04/2012  . OSA on CPAP [G47.33] 11/04/2012  . MDD (major depressive disorder), recurrent severe, without psychosis [F33.2] 10/24/2012  . Back pain, chronic [M54.9, G89.29] 08/27/2012  . Vitamin D insufficiency [E55.9] 04/04/2012  . Insomnia due to mental disorder [F48.9, F51.05] 04/04/2012  . Anxiety [F41.9] 04/04/2012  . OCD (obsessive compulsive disorder) [F42] 04/04/2012  . PTSD (post-traumatic stress disorder) [F43.10] 06/28/2011  . Schizoaffective disorder, bipolar type [F25.0] 06/28/2011  . Coronary artery disease [I25.10] 08/26/2010  . FATTY LIVER DISEASE [K76.89] 03/29/2009  . GERD [K21.9] 03/01/2009  . Morbid obesity [E66.01] 02/15/2009  . HYPOTHYROIDISM [E03.9] 02/12/2009  . HYPERCHOLESTEROLEMIA [E78.0] 02/12/2009   History of Present Illness: Allison Sharp is a 59 year old obese Caucasian female. She says she has long history of depression. Diagnosed with bipolar disorder about 8 years ago. Has been hospitalized in  psychiatric hospitals for a least 3 separate times in the past. This time around, she states, "It was on Saturday evening, my niece took me to the [facility]. I have just been hearing voices talking around me. The voices were not telling me to do anything. I have been feeling very depressed, hurting emotionally, unable to get myself to poke  up. I was lying down in my bed on Saturday, had my bottle of pills in my hand. Then, I thought, what if I take all the pills in these bottles. I did not take the pills, rather, I called my niece, told her, you either will come here & find me dead or you take me to the hospital. The suicidal thoughts started about 2 weeks ago. My depression has been worsening since about 4 weeks. The voices started about 2 weeks ago as well. In 1994, I attempted suicide by overdose, was hospitalized, my stomach pumped as a result. I was then transferred to this hospital where I stayed between 1-2 weeks. I was seeing a psychiatrist at the [facility] in [place], [place]. But this behavioral hospital closed as of January, 2016. I don't have a primary psychiatrist as of yet".  Elements:  Location:  Bipolar affective disorder, depressed. Quality:  Worsening symptoms of depression, auditory hallucinations, suicidal ideations. Severity:  Severe. Timing:  Current, acute. Duration:  Chronic, been depressed for a long time. Context:  "My depression got bad, hears voices, became suicidal".  Associated Signs/Symptoms:  Depression Symptoms:  depressed mood, insomnia, psychomotor agitation, feelings of worthlessness/guilt, hopelessness, anxiety, weight gain,  (Hypo) Manic Symptoms:  Irritable Mood, Labiality of Mood,  Anxiety Symptoms:  Excessive Worry, high anxiety  Psychotic Symptoms:  Hallucinations: Auditory  PTSD Symptoms: NA  Total Time spent with patient: 1 hour  Past Medical History:  Past Medical History  Diagnosis Date  . Hyperlipidemia   . CAD (coronary artery disease)   . Bipolar 1 disorder   . GERD (gastroesophageal reflux disease)   . Hyperthyroidism   . Chronic back pain   . Dyspnea     PFT 03/05/09 FEV1 2.77(98%),  FVC 3.25(86%), FEV1% 85, TLC 5.88(99%), DLCO 60% ,  Methacholine challenge 03/16/09 normal ,  CT chest 03/12/09 no pulmonary disease  . Anxiety   . Arthritis    . Depression   . OSA on CPAP     2 liters  . HTN (hypertension)   . History of colonoscopy 10/17/2002    by Dr Rehman-> distal non-specific proctitis, small ext hemorrhoids,   . Fungal infection   . Cellulitis   . Contusion of sacrum   . Migraine headache   . Vitamin D deficiency   . Sleep apnea   . Myocardial infarction     NOV 1997  . Hypothyroidism     States she only has hyperthyroidism  . Complication of anesthesia     States she typically gets sick s/p anesthesia  . Morbid obesity with body mass index of 50.0-59.9 in adult JAN 2011 370 LBS    2004 311 BMI 45.9  . Obsessive-compulsive disorder   . PTSD (post-traumatic stress disorder)   . Asthma   . Allergy   . Urine incontinence   . Chronic headaches   . Suicidal ideation   . COPD (chronic obstructive pulmonary disease)   . Schizoaffective disorder, bipolar type     Past Surgical History  Procedure Laterality Date  . Cholecystectomy    . Knee arthroscopy    . Appendectomy    . Tonsillectomy    . Back surgery  2008  . Total vaginal hysterectomy    . Abdominal hysterectomy      sept 1996  . Tubal ligation    . Colonoscopy  10/17/2002     Distal proctitis, small external hemorrhoids, otherwise/  normal colonoscopy. Suspect rectal bleeding secondary to hemorrhoids  . Esophagogastroduodenoscopy  03/18/09    fundic gland polyps/mild gastritis  . Cardiac catheterization      nov 1997  . Joint replacement      bil knee replacement  . Hernia repair  1978   Family History:  Family History  Problem Relation Age of Onset  . Cancer Mother   . Hypertension Mother   . Bipolar disorder Mother   . Dementia Mother   . Depression Mother   . Coronary artery disease Father   . Alcohol abuse Father   . Hypertension Brother   . Coronary artery disease Brother   . Bipolar disorder Brother   . Depression Brother   . Anesthesia problems Neg Hx   . Hypotension Neg Hx   . Malignant hyperthermia Neg Hx   . Pseudochol  deficiency Neg Hx   . Depression Sister   . Paranoid behavior Sister   . Bipolar disorder Sister   . Depression Sister   . Hypertension Sister   . Cancer Son     thyroid   Social History:  History  Alcohol Use No     History  Drug Use No    Social History   Social History  . Marital Status: Single    Spouse Name: N/A  . Number of Children: 2  . Years of Education: N/A   Occupational History  . Disabled     back problems   Social History Main Topics  . Smoking status: Never Smoker   . Smokeless tobacco: Never Used  . Alcohol Use: No  . Drug Use: No  . Sexual Activity: No   Other Topics Concern  . None   Social History Narrative   [name] is disabled and lives with her sister, Layali.  She has another sister with whom she has lived with in the past, and who is a Designer, jewellery, but not currently practicing.   She has two grown sons that she does not see often, as they live in other states. She has 5 grand children.   [name] has a long history of mental illness including depression, PTSD, suicidal and homicidal ideation.   She has been obese most all of her life. Her weight has significantly impacted her QOL. She recently lost 20 lbs. By decreasing portion size & increasing proteins.    Additional Social History:  Musculoskeletal: Strength & Muscle Tone: within normal limits Gait & Station: normal Patient leans: N/A  Psychiatric Specialty Exam: Physical Exam  Constitutional: She is oriented to person, place, and time. She appears well-developed.  HENT:  Head: Normocephalic.  Eyes: Pupils are equal, round, and reactive to light.  Neck: Normal range of motion.  Cardiovascular: Normal rate.   Hx high blood pressure   Respiratory: Effort normal.  GI: Soft.  Genitourinary:  Denies any issues in this area  Musculoskeletal:  Bilateral legs & ankles  Neurological: She is oriented to person, place, and time.  Skin: Skin is warm and dry.  Psychiatric: Her  speech is normal and behavior is normal. Judgment and thought content normal. Her mood appears anxious. Her affect is not angry, not blunt, not labile and not inappropriate. Cognition and memory are normal. She exhibits a depressed mood.    Review of Systems  Constitutional: Positive for malaise/fatigue.  HENT: Negative.   Eyes: Negative.   Respiratory: Negative.   Cardiovascular: Negative.   Gastrointestinal: Negative.   Genitourinary: Negative.   Musculoskeletal: Positive for back pain and joint pain.  Skin: Negative.   Neurological: Negative.   Endo/Heme/Allergies: Negative.   Psychiatric/Behavioral: Positive for depression and hallucinations (auditory hallucinations). Negative for suicidal ideas, memory loss and substance abuse. The patient is nervous/anxious and has insomnia.     Blood pressure 112/82, pulse 80, temperature 98.2 F (36.8 C), temperature source Oral, resp. rate 20, height _0  (1.702 m), weight 158.305 kg (349 lb), last menstrual period 02/18/1995.Body mass index is 54.65 kg/(m^2).  General Appearance: Casual and obese  Eye Contact::  Good  Speech:  Clear and Coherent  Volume:  Normal  Mood:  Anxious and Depressed  Affect:  Non-Congruent, appropriate  Thought Process:  Coherent  Orientation:  Full (Time, Place, and Person)  Thought Content:  Hallucinations: Auditory and Rumination  Suicidal Thoughts:  No  Homicidal Thoughts:  No  Memory:  Grossly intact  Judgement:  Fair  Insight:  Present  Psychomotor Activity:  Normal  Concentration:  Good  Recall:  Good  Fund of Knowledge:Good  Language: Good  Akathisia:  No  Handed:  Right  AIMS (if indicated):     Assets:  Communication Skills Desire for Improvement  ADL's:  Intact  Cognition: WNL  Sleep:  Number of Hours: 4.5   Risk to Self: Is patient at risk for suicide?: Yes (contracts for safety) Risk to Others: No Prior Inpatient Therapy: Yes Prior Outpatient Therapy: Yes  Alcohol Screening: 1. How  often do you have a drink containing alcohol?: Never 9. Have you or someone else been injured as a result of your drinking?: No 10. Has a relative or friend or a doctor or another health worker been concerned about your drinking or suggested you cut down?: No Alcohol Use Disorder Identification Test Final Score (AUDIT): 0 Brief Intervention: AUDIT score less than 7  or less-screening does not suggest unhealthy drinking-brief intervention not indicated  Allergies:   Allergies  Allergen Reactions  . Haldol [Haloperidol] Other (See Comments)    Hallucinating   . Ativan [Lorazepam] Other (See Comments)    Delirium  . Naproxen Nausea And Vomiting   Lab Results:  Results for orders placed or performed during the hospital encounter of 01/16/15 (from the past 48 hour(s))  Urine rapid drug screen (hosp performed)     Status: None   Collection Time: 01/16/15  6:54 PM  Result Value Ref Range   Opiates NONE DETECTED NONE DETECTED   Cocaine NONE DETECTED NONE DETECTED   Benzodiazepines NONE DETECTED NONE DETECTED   Amphetamines NONE DETECTED NONE DETECTED   Tetrahydrocannabinol NONE DETECTED NONE DETECTED   Barbiturates NONE DETECTED NONE DETECTED    Comment:        DRUG SCREEN FOR MEDICAL PURPOSES ONLY.  IF CONFIRMATION IS NEEDED FOR ANY PURPOSE, NOTIFY LAB WITHIN 5 DAYS.        LOWEST DETECTABLE LIMITS FOR URINE DRUG SCREEN Drug Class       Cutoff (ng/mL) Amphetamine      1000 Barbiturate      200 Benzodiazepine   785 Tricyclics       885 Opiates          300 Cocaine          300 THC              50   Urinalysis, Routine w reflex microscopic (not at [facility])     Status: Abnormal   Collection Time: 01/16/15  6:54 PM  Result Value Ref Range   Color, Urine AMBER (A) YELLOW    Comment: BIOCHEMICALS MAY BE AFFECTED BY COLOR   APPearance CLOUDY (A) CLEAR   Specific Gravity, Urine 1.037 (H) 1.005 - 1.030   pH 8.0 5.0 - 8.0   Glucose, UA NEGATIVE NEGATIVE mg/dL   Hgb urine dipstick  NEGATIVE NEGATIVE   Bilirubin Urine NEGATIVE NEGATIVE   Ketones, ur NEGATIVE NEGATIVE mg/dL   Protein, ur 30 (A) NEGATIVE mg/dL   Urobilinogen, UA 1.0 0.0 - 1.0 mg/dL   Nitrite NEGATIVE NEGATIVE   Leukocytes, UA SMALL (A) NEGATIVE  Urine microscopic-add on     Status: Abnormal   Collection Time: 01/16/15  6:54 PM  Result Value Ref Range   Squamous Epithelial / LPF RARE RARE   WBC, UA 3-6 <3 WBC/hpf   Bacteria, UA RARE RARE   Crystals CA OXALATE CRYSTALS (A) NEGATIVE   Urine-Other MUCOUS PRESENT   CBC with Differential/Platelet     Status: Abnormal   Collection Time: 01/16/15  7:52 PM  Result Value Ref Range   WBC 6.2 4.0 - 10.5 K/uL   RBC 4.49 3.87 - 5.11 MIL/uL   Hemoglobin 14.0 12.0 - 15.0 g/dL   HCT 41.3 36.0 - 46.0 %   MCV 92.0 78.0 - 100.0 fL   MCH 31.2 26.0 - 34.0 pg   MCHC 33.9 30.0 - 36.0 g/dL   RDW 13.1 11.5 - 15.5 %   Platelets 87 (L) 150 - 400 K/uL    Comment: REPEATED TO VERIFY SPECIMEN CHECKED FOR CLOTS PLATELET COUNT CONFIRMED BY SMEAR    Neutrophils Relative % 46 43 - 77 %   Neutro Abs 2.8 1.7 - 7.7 K/uL   Lymphocytes Relative 42 12 - 46 %   Lymphs Abs 2.6 0.7 - 4.0 K/uL   Monocytes Relative 10 3 - 12 %   Monocytes  Absolute 0.6 0.1 - 1.0 K/uL   Eosinophils Relative 2 0 - 5 %   Eosinophils Absolute 0.2 0.0 - 0.7 K/uL   Basophils Relative 0 0 - 1 %   Basophils Absolute 0.0 0.0 - 0.1 K/uL  Comprehensive metabolic panel     Status: Abnormal   Collection Time: 01/16/15  7:52 PM  Result Value Ref Range   Sodium 140 135 - 145 mmol/L   Potassium 3.9 3.5 - 5.1 mmol/L   Chloride 104 101 - 111 mmol/L   CO2 29 22 - 32 mmol/L   Glucose, Bld 100 (H) 65 - 99 mg/dL   BUN 11 6 - 20 mg/dL   Creatinine, Ser 0.88 0.44 - 1.00 mg/dL   Calcium 9.3 8.9 - 10.3 mg/dL   Total Protein 6.8 6.5 - 8.1 g/dL   Albumin 3.9 3.5 - 5.0 g/dL   AST 16 15 - 41 U/L   ALT 10 (L) 14 - 54 U/L   Alkaline Phosphatase 57 38 - 126 U/L   Total Bilirubin 0.5 0.3 - 1.2 mg/dL   GFR calc non Af  Amer >60 >60 mL/min   GFR calc Af Amer >60 >60 mL/min    Comment: (NOTE) The eGFR has been calculated using the CKD EPI equation. This calculation has not been validated in all clinical situations. eGFR's persistently <60 mL/min signify possible Chronic Kidney Disease.    Anion gap 7 5 - 15  Valproic acid level     Status: None   Collection Time: 01/16/15  7:52 PM  Result Value Ref Range   Valproic Acid Lvl 93 50.0 - 100.0 ug/mL  Ethanol     Status: None   Collection Time: 01/16/15  7:52 PM  Result Value Ref Range   Alcohol, Ethyl (B) <5 <5 mg/dL    Comment:        LOWEST DETECTABLE LIMIT FOR SERUM ALCOHOL IS 5 mg/dL FOR MEDICAL PURPOSES ONLY    Current Medications: Current Facility-Administered Medications  Medication Dose Route Frequency Provider Last Rate Last Dose  . albuterol (PROVENTIL HFA;VENTOLIN HFA) 108 (90 BASE) MCG/ACT inhaler 2 puff  2 puff Inhalation Q6H PRN Delfin Gant, NP      . cloNIDine (CATAPRES) tablet 0.2 mg  0.2 mg Oral QHS Delfin Gant, NP   0.2 mg at 01/17/15 2250  . clotrimazole (LOTRIMIN) 1 % cream   Topical BID Saramma Eappen, MD      . divalproex (DEPAKOTE ER) 24 hr tablet 1,500 mg  1,500 mg Oral QHS Delfin Gant, NP   1,500 mg at 01/17/15 2250  . DULoxetine (CYMBALTA) DR capsule 60 mg  60 mg Oral BID Delfin Gant, NP   60 mg at 01/18/15 0851  . levothyroxine (SYNTHROID, LEVOTHROID) tablet 112 mcg  112 mcg Oral QAC breakfast Delfin Gant, NP   112 mcg at 01/18/15 0850  . pantoprazole (PROTONIX) EC tablet 40 mg  40 mg Oral Daily Delfin Gant, NP   40 mg at 01/18/15 0850  . traZODone (DESYREL) tablet 200 mg  200 mg Oral QHS Delfin Gant, NP   200 mg at 01/17/15 2250   PTA Medications: Prescriptions prior to admission  Medication Sig Dispense Refill Last Dose  . albuterol (PROVENTIL HFA;VENTOLIN HFA) 108 (90 BASE) MCG/ACT inhaler Inhale 2 puffs into the lungs every 6 (six) hours as needed for wheezing or  shortness of breath. 1 Inhaler 6 Past Week at Unknown time  . bisacodyl (DULCOLAX) 10 MG  suppository Place 1 suppository (10 mg total) rectally as needed for moderate constipation. 12 suppository 0 Past Week at Unknown time  . cloNIDine (CATAPRES) 0.2 MG tablet Take 0.2 mg by mouth at bedtime.   01/16/2015 at Unknown time  . Clotrimazole 1 % OINT Apply sparingly twice daily to rash. 60 g 1 01/16/2015 at Unknown time  . divalproex (DEPAKOTE ER) 500 MG 24 hr tablet Take 1 tablet (500 mg total) by mouth 3 (three) times daily. On occasions may take one during the day for extreme stress. (Patient taking differently: Take 1,500 mg by mouth at bedtime and may repeat dose one time if needed. ) 90 tablet 1 01/16/2015 at Unknown time  . DULoxetine (CYMBALTA) 60 MG capsule Take 1 capsule (60 mg total) by mouth 2 (two) times daily. 60 capsule 0 01/16/2015 at Unknown time  . levothyroxine (SYNTHROID, LEVOTHROID) 112 MCG tablet Take 1 tablet (112 mcg total) by mouth daily before breakfast. For hypothyroidism. 90 tablet 0 01/17/2015 at Unknown time  . Melatonin 10 MG CAPS Take 10 mg by mouth at bedtime.   01/16/2015 at Unknown time  . omeprazole (PRILOSEC) 20 MG capsule Take 1 capsule (20 mg total) by mouth daily. 30 capsule 6 01/17/2015 at Unknown time  . celecoxib (CELEBREX) 200 MG capsule Take 1 capsule (200 mg total) by mouth 2 (two) times daily. (Patient not taking: Reported on 01/16/2015) 180 capsule 1 Unknown at Unknown time  . Multiple Vitamin (MULTIVITAMIN WITH MINERALS) TABS Take 1 tablet by mouth daily. For nutritional supplementation. (Patient not taking: Reported on 01/16/2015) 30 tablet 0 Unknown at Unknown time  . verapamil (CALAN-SR) 180 MG CR tablet Take 1 tablet (180 mg total) by mouth every morning. (Patient not taking: Reported on 01/16/2015) 30 tablet 6 Unknown at Unknown time    Previous Psychotropic Medications: Yes   Substance Abuse History in the last 12 months:  No.  Consequences of Substance  Abuse: NA  Results for orders placed or performed during the hospital encounter of 01/16/15 (from the past 72 hour(s))  Urine rapid drug screen (hosp performed)     Status: None   Collection Time: 01/16/15  6:54 PM  Result Value Ref Range   Opiates NONE DETECTED NONE DETECTED   Cocaine NONE DETECTED NONE DETECTED   Benzodiazepines NONE DETECTED NONE DETECTED   Amphetamines NONE DETECTED NONE DETECTED   Tetrahydrocannabinol NONE DETECTED NONE DETECTED   Barbiturates NONE DETECTED NONE DETECTED    Comment:        DRUG SCREEN FOR MEDICAL PURPOSES ONLY.  IF CONFIRMATION IS NEEDED FOR ANY PURPOSE, NOTIFY LAB WITHIN 5 DAYS.        LOWEST DETECTABLE LIMITS FOR URINE DRUG SCREEN Drug Class       Cutoff (ng/mL) Amphetamine      1000 Barbiturate      200 Benzodiazepine   372 Tricyclics       902 Opiates          300 Cocaine          300 THC              50   Urinalysis, Routine w reflex microscopic (not at [facility])     Status: Abnormal   Collection Time: 01/16/15  6:54 PM  Result Value Ref Range   Color, Urine AMBER (A) YELLOW    Comment: BIOCHEMICALS MAY BE AFFECTED BY COLOR   APPearance CLOUDY (A) CLEAR   Specific Gravity, Urine 1.037 (H) 1.005 - 1.030  pH 8.0 5.0 - 8.0   Glucose, UA NEGATIVE NEGATIVE mg/dL   Hgb urine dipstick NEGATIVE NEGATIVE   Bilirubin Urine NEGATIVE NEGATIVE   Ketones, ur NEGATIVE NEGATIVE mg/dL   Protein, ur 30 (A) NEGATIVE mg/dL   Urobilinogen, UA 1.0 0.0 - 1.0 mg/dL   Nitrite NEGATIVE NEGATIVE   Leukocytes, UA SMALL (A) NEGATIVE  Urine microscopic-add on     Status: Abnormal   Collection Time: 01/16/15  6:54 PM  Result Value Ref Range   Squamous Epithelial / LPF RARE RARE   WBC, UA 3-6 <3 WBC/hpf   Bacteria, UA RARE RARE   Crystals CA OXALATE CRYSTALS (A) NEGATIVE   Urine-Other MUCOUS PRESENT   CBC with Differential/Platelet     Status: Abnormal   Collection Time: 01/16/15  7:52 PM  Result Value Ref Range   WBC 6.2 4.0 - 10.5 K/uL   RBC 4.49  3.87 - 5.11 MIL/uL   Hemoglobin 14.0 12.0 - 15.0 g/dL   HCT 41.3 36.0 - 46.0 %   MCV 92.0 78.0 - 100.0 fL   MCH 31.2 26.0 - 34.0 pg   MCHC 33.9 30.0 - 36.0 g/dL   RDW 13.1 11.5 - 15.5 %   Platelets 87 (L) 150 - 400 K/uL    Comment: REPEATED TO VERIFY SPECIMEN CHECKED FOR CLOTS PLATELET COUNT CONFIRMED BY SMEAR    Neutrophils Relative % 46 43 - 77 %   Neutro Abs 2.8 1.7 - 7.7 K/uL   Lymphocytes Relative 42 12 - 46 %   Lymphs Abs 2.6 0.7 - 4.0 K/uL   Monocytes Relative 10 3 - 12 %   Monocytes Absolute 0.6 0.1 - 1.0 K/uL   Eosinophils Relative 2 0 - 5 %   Eosinophils Absolute 0.2 0.0 - 0.7 K/uL   Basophils Relative 0 0 - 1 %   Basophils Absolute 0.0 0.0 - 0.1 K/uL  Comprehensive metabolic panel     Status: Abnormal   Collection Time: 01/16/15  7:52 PM  Result Value Ref Range   Sodium 140 135 - 145 mmol/L   Potassium 3.9 3.5 - 5.1 mmol/L   Chloride 104 101 - 111 mmol/L   CO2 29 22 - 32 mmol/L   Glucose, Bld 100 (H) 65 - 99 mg/dL   BUN 11 6 - 20 mg/dL   Creatinine, Ser 0.88 0.44 - 1.00 mg/dL   Calcium 9.3 8.9 - 10.3 mg/dL   Total Protein 6.8 6.5 - 8.1 g/dL   Albumin 3.9 3.5 - 5.0 g/dL   AST 16 15 - 41 U/L   ALT 10 (L) 14 - 54 U/L   Alkaline Phosphatase 57 38 - 126 U/L   Total Bilirubin 0.5 0.3 - 1.2 mg/dL   GFR calc non Af Amer >60 >60 mL/min   GFR calc Af Amer >60 >60 mL/min    Comment: (NOTE) The eGFR has been calculated using the CKD EPI equation. This calculation has not been validated in all clinical situations. eGFR's persistently <60 mL/min signify possible Chronic Kidney Disease.    Anion gap 7 5 - 15  Valproic acid level     Status: None   Collection Time: 01/16/15  7:52 PM  Result Value Ref Range   Valproic Acid Lvl 93 50.0 - 100.0 ug/mL  Ethanol     Status: None   Collection Time: 01/16/15  7:52 PM  Result Value Ref Range   Alcohol, Ethyl (B) <5 <5 mg/dL    Comment:  LOWEST DETECTABLE LIMIT FOR SERUM ALCOHOL IS 5 mg/dL FOR MEDICAL PURPOSES ONLY      Observation Level/Precautions:  15 minute checks  Laboratory:  Per ED  Psychotherapy: Group sessions  Medications: Cymbalta 60 mg, Depakote ER15,00 mg, Trazodone 200 mg,  Consultations: As needed  Discharge Concerns:  Safety, mood stability  Estimated LOS: 3-5 days  Other:     Psychological Evaluations: Yes   Treatment Plan Summary: Daily contact with patient to assess and evaluate symptoms and progress in treatment and Medication management: 1. Admit for crisis management and stabilization, estimated length of stay 3-5 days.  2. Medication management to reduce current symptoms to base line and improve the patient's overall level of functioning; continue Cymbalta 60 mg. Depakote ER 1,500 mg for mood stabilization, Trazodone 200 mg for insomnia 3. Treat health problems as indicated; resume Clonidine 0.2 mg for HTN, Synthroid 112 mg for Hypothyroidism, Protonix 40 mg for acid reflux, Lotrimin 1% cream, Albuterol inhaler for Asthma.  4. Develop treatment plan to decrease risk of relapse upon discharge and the need for readmission.  5. Psycho-social education regarding relapse prevention and self care.  6. Health care follow up as needed for medical problems.  7. Review, reconcile, and reinstate any pertinent home medications for other health issues where appropriate. 8. Call for consults with hospitalist for any additional specialty patient care services as needed.  Medical Decision Making:  New problem, with additional work up planned, Review of Psycho-Social Stressors (1), Review or order clinical lab tests (1), Review and summation of old records (2), Review of Medication Regimen & Side Effects (2) and Review of New Medication or Change in Dosage (2)  I certify that inpatient services furnished can reasonably be expected to improve the patient's condition.   Encarnacion Slates, PMHNP, FNP-BC 8/22/20161:57 PM  I personally assessed the patient, reviewed the physical exam and labs and  formulated the treatment plan Geralyn Flash A. Sabra Heck, M.D.

## 2015-01-18 NOTE — Progress Notes (Signed)
D:  Passive SI- contracts for safety. AH-non command, but can;'t make out what they are saying.   Pt denies HI/VH. Pt is pleasant and cooperative. Pt stated she feels a little better today, she is glad she came in to get help. Pt has good support system from her niece. Pt stated she needs a prescription for a walker when she leaves  So her insurance could pay for it.    A: Pt was offered support and encouragement. Pt was given scheduled medications. Pt was encourage to attend groups. Q 15 minute checks were done for safety.   R:Pt attends groups and interacts well with peers and staff. Pt is taking medication. Pt has no complaints at this time .Pt receptive to treatment and safety maintained on unit.

## 2015-01-18 NOTE — BHH Group Notes (Signed)
[place] LCSW Group Therapy  01/18/2015 1:15 pm  Type of Therapy: Process Group Therapy  Participation Level:  Active  Participation Quality:  Appropriate  Affect:  Flat  Cognitive:  Oriented  Insight:  Improving  Engagement in Group:  Limited  Engagement in Therapy:  Limited  Modes of Intervention:  Activity, Clarification, Education, Problem-solving and Support  Summary of Progress/Problems: Today's group addressed the issue of overcoming obstacles.  Patients were asked to identify their biggest obstacle post d/c that stands in the way of their on-going success, and then problem solve as to how to manage this.  Allison Sharp attended and stayed the entire time.  Gave others appropriate resources and feedback.  As for her situation, talked about having "a very dysfunctional family" and her inability to "say no to them.  If anyone asks me for a ride, or to go to the store for them, I will do it."  It was difficult to ascertain exactly how this lead to hospitalization, but she admitted that she was hearing voices telling her to take an overdose of medication.  [name] 01/18/2015   4:23 PM
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
## 2015-01-18 NOTE — Progress Notes (Cosign Needed)
Writer provided pt with a walker in lieu of a cane.

## 2015-01-19 DIAGNOSIS — D682 Hereditary deficiency of other clotting factors: Secondary | ICD-10-CM | POA: Clinically undetermined

## 2015-01-19 DIAGNOSIS — F332 Major depressive disorder, recurrent severe without psychotic features: Secondary | ICD-10-CM

## 2015-01-19 LAB — TSH: TSH: 2.994 u[IU]/mL (ref 0.350–4.500)

## 2015-01-19 LAB — PROTIME-INR
INR: 0.93 (ref 0.00–1.49)
Prothrombin Time: 12.7 seconds (ref 11.6–15.2)

## 2015-01-19 MED ORDER — TRAZODONE HCL 50 MG PO TABS
50.0000 mg | ORAL_TABLET | Freq: Every evening | ORAL | Status: DC | PRN
  Administered 2015-01-19: 50 mg via ORAL
  Filled 2015-01-19: qty 1

## 2015-01-19 MED ORDER — IBUPROFEN 600 MG PO TABS
600.0000 mg | ORAL_TABLET | Freq: Four times a day (QID) | ORAL | Status: DC | PRN
  Administered 2015-01-19 – 2015-01-22 (×3): 600 mg via ORAL
  Filled 2015-01-19 (×3): qty 1

## 2015-01-19 MED ORDER — ESZOPICLONE 2 MG PO TABS
2.0000 mg | ORAL_TABLET | Freq: Every day | ORAL | Status: DC
  Administered 2015-01-19: 2 mg via ORAL
  Filled 2015-01-19: qty 1

## 2015-01-19 MED ORDER — NON FORMULARY: 2.0000 mg | Freq: Every day | Status: DC

## 2015-01-19 MED ORDER — LAMOTRIGINE 25 MG PO TABS
25.0000 mg | ORAL_TABLET | Freq: Every day | ORAL | Status: DC
  Administered 2015-01-19 – 2015-01-21 (×3): 25 mg via ORAL
  Filled 2015-01-19 (×6): qty 1

## 2015-01-19 MED ORDER — CLONIDINE HCL 0.1 MG PO TABS
0.1000 mg | ORAL_TABLET | ORAL | Status: AC
  Administered 2015-01-20: 0.1 mg via ORAL
  Filled 2015-01-19 (×2): qty 1

## 2015-01-19 NOTE — BHH Suicide Risk Assessment (Signed)
[place] INPATIENT:  Family/Significant Other Suicide Prevention Education  Suicide Prevention Education:  Education Completed;[name] Redmond Pulling (pt's niece) [phone] has been identified by the patient as the family member/significant other with whom the patient will be residing, and identified as the person(s) who will aid the patient in the event of a mental health crisis (suicidal ideations/suicide attempt).  With written consent from the patient, the family member/significant other has been provided the following suicide prevention education, prior to the and/or following the discharge of the patient.  The suicide prevention education provided includes the following:  Suicide risk factors  Suicide prevention and interventions  National Suicide Hotline telephone number  [facility] assessment telephone number  [facility] Emergency Assistance [employer] and/or Residential Mobile Crisis Unit telephone number  Request made of family/significant other to:  Remove weapons (e.g., guns, rifles, knives), all items previously/currently identified as safety concern.    Remove drugs/medications (over-the-counter, prescriptions, illicit drugs), all items previously/currently identified as a safety concern.  The family member/significant other verbalizes understanding of the suicide prevention education information provided.  The family member/significant other agrees to remove the items of safety concern listed above.  Smart, Osa Fogarty LCSWA 01/19/2015, 4:18 PM

## 2015-01-19 NOTE — Progress Notes (Signed)
D: Pt denies SI/HI/AVH. Pt is pleasant and cooperative. Pt stated she was feeling better, but tired earlier today. Pt sad about possibly missing homecoming at her church on Sunday, but pt appeared to understand if she was still here that that was more important.    A: Pt was offered support and encouragement. Pt was given scheduled medications. Pt was encourage to attend groups. Q 15 minute checks were done for safety.    R:Pt attends groups and interacts well with peers and staff. Pt is taking medication. Pt has no complaints at this time .Pt receptive to treatment and safety maintained on unit.

## 2015-01-19 NOTE — Plan of Care (Signed)
Problem: Alteration in mood Goal: LTG-Patient reports reduction in suicidal thoughts (Patient reports reduction in suicidal thoughts and is able to verbalize a safety plan for whenever patient is feeling suicidal)  Outcome: Progressing Pt denies SI at this time     

## 2015-01-19 NOTE — BHH Group Notes (Signed)
[place] LCSW Group Therapy  01/19/2015 , 1:13 PM   Type of Therapy:  Group Therapy  Participation Level:  Active  Participation Quality:  Attentive  Affect:  Appropriate  Cognitive:  Alert  Insight:  Improving  Engagement in Therapy:  Engaged  Modes of Intervention:  Discussion, Exploration and Socialization  Summary of Progress/Problems: Today's group focused on the term Diagnosis.  Participants were asked to define the term, and then pronounce whether it is a negative, positive or neutral term.  Allison Sharp was present the entire time.  "I am not really paying attention.  I feel a little of it today."  Later rebounded when the subject of supports came up.  Talked about her church, and how she has has been involved until about 6 to 8 weeks ago, when she did not feel like going out anymore.  States this Sunday is "homecoming" day, and she is already looking forward to attend that service.  Confirmed that this means she is currently feeling better than she has in awhile.  Roque Lias B 01/19/2015 , 1:13 PM

## 2015-01-19 NOTE — Tx Team (Signed)
Interdisciplinary Treatment Plan Update (Adult)  Date:  01/19/2015  Time Reviewed:  9:02 AM   Progress in Treatment: Attending groups: Yes. Participating in groups:  Yes. Taking medication as prescribed:  Yes. Tolerating medication:  Yes. Family/Significant othe contact made:  SPE required for this pt. Pt gave permission for CSW to contact her niece.  Patient understands diagnosis:  Yes. and As evidenced by:  seeking treatment for AH, depression, SI with plan, and for medication stabilization.  Discussing patient identified problems/goals with staff:  Yes. Medical problems stabilized or resolved:  Yes. Denies suicidal/homicidal ideation: Yes. Issues/concerns per patient self-inventory:  Other:  Discharge Plan or Barriers: Pt plans to return to her home where she lives with her niece and her great nephew. Pt has no current providers and would like referral for med management and therapy to [facility] in [place]. CSW assessing.   Reason for Continuation of Hospitalization: Depression Hallucinations Medication stabilization Suicidal ideation  Comments:  Allison Sharp is a 59 year old obese Caucasian female. She says she has long history of depression. Diagnosed with bipolar disorder about 8 years ago. Has been hospitalized in psychiatric hospitals for a least 3 separate times in the past. This time around, she states, "It was on Saturday evening, my niece took me to the [facility]. I have just been hearing voices talking around me. The voices were not telling me to do anything. I have been feeling very depressed, hurting emotionally, unable to get myself to poke up. I was lying down in my bed on Saturday, had my bottle of pills in my hand. Then, I thought, what if I take all the pills in these bottles. I did not take the pills, rather, I called my niece, told her, you either will come here & find me dead or you take me to the hospital. The suicidal thoughts started about 2 weeks  ago. My depression has been worsening since about 4 weeks. The voices started about 2 weeks ago as well. In 1994, I attempted suicide by overdose, was hospitalized, my stomach pumped as a result. I was then transferred to this hospital where I stayed between 1-2 weeks. I was seeing a psychiatrist at the [facility] in [place], [place]. But this behavioral hospital closed as of January, 2016. I don't have a primary psychiatrist as of yet".   Estimated length of stay:  3-5 days    Additional Comments:  Patient and CSW reviewed pt's identified goals and treatment plan. Patient verbalized understanding and agreed to treatment plan. CSW reviewed [facility] "Discharge Process and Patient Involvement" Form. Pt verbalized understanding of information provided and signed form.    Review of initial/current patient goals per problem list:  1. Goal(s): Patient will participate in aftercare plan  Met: Yes    Target date: at discharge  As evidenced by: Patient will participate within aftercare plan AEB aftercare provider and housing plan at discharge being identified.  8/23: Pt plans to return home with niece and great-nephew. followup at [facility] in [place] per pt request.  2. Goal (s): Patient will exhibit decreased depressive symptoms and suicidal ideations.  Met: No   Target date: at discharge  As evidenced by: Patient will utilize self rating of depression at 3 or below and demonstrate decreased signs of depression or be deemed stable for discharge by MD.  8/23: Pt rates depression as moderate this morning. No SI/HI today. Pt pleasant/calm during assessment. Goal progressing.    3. Goal(s): Patient will demonstrate  decreased signs of psychosis.  Met: No.  Target Date: At discharge  As evidenced by: Patient will deny AVH and will demonstrate decreased signs of psychosis.  8/23: Pt reports muffled voices. "they are starting to get harder to hear." Pt oriented to  time/person/place. Pt denies VH. Goal progressing.   Attendees: Patient:   01/19/2015 9:02 AM   Family:   01/19/2015 9:02 AM   Physician:  Dr. Carlton Adam, MD 01/19/2015 9:02 AM   Nursing:   Trinna Post RN 01/19/2015 9:02 AM   Clinical Social Worker: Maxie Better, [place]  01/19/2015 9:02 AM   Clinical Social Worker: Erasmo Downer Drinkard LCSWA; Peri Maris LCSWA 01/19/2015 9:02 AM   Other:  Gerline Legacy Nurse Case Manager 01/19/2015 9:02 AM   Other:  Lucinda Dell; [facility]  01/19/2015 9:02 AM   Other:   01/19/2015 9:02 AM   Other:  01/19/2015 9:02 AM   Other:  01/19/2015 9:02 AM   Other:  01/19/2015 9:02 AM    01/19/2015 9:02 AM    01/19/2015 9:02 AM    01/19/2015 9:02 AM    01/19/2015 9:02 AM    Scribe for Treatment Team:   Maxie Better, [place]  01/19/2015 9:02 AM
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
## 2015-01-19 NOTE — [facility] Group Notes (Signed)
[facility] Group Notes:  (Nursing/MHT/Case Management/Adjunct)  Date:  01/19/2015  Time:  12:28 PM  Type of Therapy:  Group Therapy  Participation Level:  Did Not Attend  Participation Quality:  Did not attend  Affect:  Did not attend group  Cognitive:  Did not attend group.  Insight:  None  Engagement in Group:  Did not attend group.  Modes of Intervention:  Discussion and Education  Summary of Progress/Problems:  Purpose of the group was recovery and goals. Discussed the importance of setting appropriate and measurable goals. Reviewed sleep hygiene. Lafonda did not attend group but was encouraged to attend.   Barbette Or Tenita Cue 01/19/2015, 12:28 PM

## 2015-01-19 NOTE — Progress Notes (Signed)
D:Affect is sad at times,mood is depressed. Pt c/o HA earlier toady and received PRN Motrin with relief.Pt rates her depression as 8 with anxiety as a 9. Says that she is working on ways to improve her self control. A:Support and encouragement offered. R:Receptive. No further complaints of pain or problems at this time.

## 2015-01-20 LAB — URINE CULTURE: Special Requests: NORMAL

## 2015-01-20 MED ORDER — DOXEPIN HCL 10 MG PO CAPS
10.0000 mg | ORAL_CAPSULE | Freq: Every day | ORAL | Status: DC
  Administered 2015-01-20: 10 mg via ORAL
  Filled 2015-01-20 (×2): qty 1

## 2015-01-20 MED ORDER — FLUVOXAMINE MALEATE 50 MG PO TABS
25.0000 mg | ORAL_TABLET | Freq: Every day | ORAL | Status: DC
  Administered 2015-01-20 – 2015-01-21 (×2): 25 mg via ORAL
  Filled 2015-01-20 (×4): qty 1

## 2015-01-20 MED ORDER — DULOXETINE HCL 60 MG PO CPEP
60.0000 mg | ORAL_CAPSULE | Freq: Every day | ORAL | Status: DC
  Filled 2015-01-20: qty 1

## 2015-01-20 MED ORDER — DULOXETINE HCL 30 MG PO CPEP
30.0000 mg | ORAL_CAPSULE | Freq: Every day | ORAL | Status: DC
  Administered 2015-01-21: 30 mg via ORAL
  Filled 2015-01-20 (×2): qty 1

## 2015-01-20 MED ORDER — HYDROXYZINE HCL 50 MG PO TABS: 50.0000 mg | ORAL_TABLET | Freq: Every evening | ORAL | Status: DC | PRN

## 2015-01-20 NOTE — [facility] Group Notes (Signed)
[facility] Mental [facility] [employer] Group Therapy  01/20/2015 , 1:28 PM    Type of Therapy:  Mental Health Association Presentation  Participation Level:  Active  Participation Quality:  Attentive  Affect:  Blunted  Cognitive:  Oriented  Insight:  Limited  Engagement in Therapy:  Engaged  Modes of Intervention:  Discussion, Education and Socialization  Summary of Progress/Problems:  [name] from [employer] came to present his recovery story and play the guitar.  Stayed for the presenter's talk, but left just before he started playing guitar, and did not return.  Allison Sharp B 01/20/2015 , 1:28 PM

## 2015-01-20 NOTE — Progress Notes (Signed)
Adult Psychoeducational Group Note  Date:  01/20/2015 Time:  9:40 PM  Group Topic/Focus:  Wrap-Up Group:   The focus of this group is to help patients review their daily goal of treatment and discuss progress on daily workbooks.  Participation Level:  Minimal  Participation Quality:  Appropriate  Affect:  Flat  Cognitive:  Appropriate  Insight: Limited  Engagement in Group:  Limited  Modes of Intervention:  Socialization and Support  Additional Comments:  Patient attended and participated in group tonight. She reports having a rough day. She had to at one point step off the unit in order to calm herself down. Since she returned to the unit things has been better. Today she went for her meals, attended group and spoke with her doctor.  Salley Scarlet [facility] 01/20/2015, 9:40 PM

## 2015-01-20 NOTE — Progress Notes (Signed)
DAR Note: Allison Sharp has been up on the unit.  Attending groups.  She reports that she had a rough night because of the medication changes.  "I am not sleeping."  She states that since she was up and down all night her back started hurting.  Pain 8/10 but states that she doesn't want to take anything to see if it will go away on her own.  She reports that her depression is 8/10, hopelessness 4/10 and anxiety 5/10.  She denies any SI/HI or A/V hallucinations.  Her goal for the day is to take her medications and attend groups.  She took her medications without difficulty.  She appears to be in no distress.  Q 15 minute checks maintained for safety.

## 2015-01-20 NOTE — Plan of Care (Signed)
Problem: Alteration in mood Goal: LTG-Patient reports reduction in suicidal thoughts (Patient reports reduction in suicidal thoughts and is able to verbalize a safety plan for whenever patient is feeling suicidal)  Outcome: Progressing Allison Sharp reports no suicidal thoughts today.  She states that she is still feeling depressed and is hoping some medication changes will help.

## 2015-01-20 NOTE — Progress Notes (Addendum)
[facility] MD Progress Note  01/20/2015 2:15 PM Allison Sharp  MRN:  [id number] Subjective: Patient states " I did not sleep at all last night. I kept waking up every 2 hrs.'  Objective; Allison Sharp is a 59 year old obese Caucasian female with a long hx of depression presented to [facility] with worsening depression. Patient seen and chart reviewed.Discussed patient with treatment team.  Pt on evaluation does not seem to meet criteria for bipolar do, she denies ever having a period where she had extreme energy/financial extravagance, sleep issues due to high energy or other bipolar do sx. Pt hence was started on treatment for MDD.  Pt today continues to report sleep difficulties . Pt was on Depakote on admission - but it had to be discontinued 2/2 low platelet count which can happen in 1-27% of pt's on Depakote rx. Pt today depressed as well as anxious about her sleep issues . She did not tolerate the Lunesta last night. She was on Trazodone 200 mg and still had sleep issues. Will change to Doxepin tonight. Plan to taper off Cymbalta and start Luvox for affective sx. Pt agrees with plan.  Principal Problem: MDD (major depressive disorder), recurrent severe, without psychosis Diagnosis:   Patient Active Problem List   Diagnosis Date Noted  . 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 Acute cystitis with hematuria [N30.01] 04/01/2014  . Bilateral knee pain [M25.561, M25.562] 01/07/2014  . Scar condition and fibrosis of skin [L90.5] 12/11/2013  . Essential hypertension, benign [I10] 07/01/2013  . Acute respiratory failure [J96.00] 11/05/2012  . Asthma with acute exacerbation [J45.901] 11/04/2012  . OSA on CPAP [G47.33] 11/04/2012  . MDD (major depressive disorder), recurrent severe, without psychosis [F33.2] 10/24/2012  . Back pain, chronic [M54.9, G89.29] 08/27/2012  . Vitamin D insufficiency [E55.9] 04/04/2012  . Insomnia due to mental disorder [F48.9, F51.05] 04/04/2012  . Anxiety [F41.9] 04/04/2012  .  OCD (obsessive compulsive disorder) [F42] 04/04/2012  . PTSD (post-traumatic stress disorder) [F43.10] 06/28/2011  . Schizoaffective disorder, bipolar type [F25.0] 06/28/2011  . Coronary artery disease [I25.10] 08/26/2010  . FATTY LIVER DISEASE [K76.89] 03/29/2009  . GERD [K21.9] 03/01/2009  . Morbid obesity [E66.01] 02/15/2009  . HYPOTHYROIDISM [E03.9] 02/12/2009  . HYPERCHOLESTEROLEMIA [E78.0] 02/12/2009   Total Time spent with patient: 30 minutes   Past Medical History:  Past Medical History  Diagnosis Date  . Hyperlipidemia   . CAD (coronary artery disease)   . Bipolar 1 disorder   . GERD (gastroesophageal reflux disease)   . Hyperthyroidism   . Chronic back pain   . Dyspnea     PFT 03/05/09 FEV1 2.77(98%), FVC 3.25(86%), FEV1% 85, TLC 5.88(99%), DLCO 60% ,  Methacholine challenge 03/16/09 normal ,  CT chest 03/12/09 no pulmonary disease  . Anxiety   . Arthritis   . Depression   . OSA on CPAP     2 liters  . HTN (hypertension)   . History of colonoscopy 10/17/2002    by Dr Rehman-> distal non-specific proctitis, small ext hemorrhoids,   . Fungal infection   . Cellulitis   . Contusion of sacrum   . Migraine headache   . Vitamin D deficiency   . Sleep apnea   . Myocardial infarction     NOV 1997  . Hypothyroidism     States she only has hyperthyroidism  . Complication of anesthesia     States she typically gets sick s/p anesthesia  . Morbid obesity with body mass index of 50.0-59.9 in adult JAN 2011  370 LBS    2004 311 BMI 45.9  . Obsessive-compulsive disorder   . PTSD (post-traumatic stress disorder)   . Asthma   . Allergy   . Urine incontinence   . Chronic headaches   . Suicidal ideation   . COPD (chronic obstructive pulmonary disease)   . Schizoaffective disorder, bipolar type     Past Surgical History  Procedure Laterality Date  . Cholecystectomy    . Knee arthroscopy    . Appendectomy    . Tonsillectomy    . Back surgery  2008  . Total vaginal  hysterectomy    . Abdominal hysterectomy      sept 1996  . Tubal ligation    . Colonoscopy  10/17/2002     Distal proctitis, small external hemorrhoids, otherwise/  normal colonoscopy. Suspect rectal bleeding secondary to hemorrhoids  . Esophagogastroduodenoscopy  03/18/09    fundic gland polyps/mild gastritis  . Cardiac catheterization      nov 1997  . Joint replacement      bil knee replacement  . Hernia repair  1978   Family History:  Family History  Problem Relation Age of Onset  . Cancer Mother   . Hypertension Mother   . Bipolar disorder Mother   . Dementia Mother   . Depression Mother   . Coronary artery disease Father   . Alcohol abuse Father   . Hypertension Brother   . Coronary artery disease Brother   . Bipolar disorder Brother   . Depression Brother   . Anesthesia problems Neg Hx   . Hypotension Neg Hx   . Malignant hyperthermia Neg Hx   . Pseudochol deficiency Neg Hx   . Depression Sister   . Paranoid behavior Sister   . Bipolar disorder Sister   . Depression Sister   . Hypertension Sister   . Cancer Son     thyroid   Social History:  History  Alcohol Use No     History  Drug Use No    Social History   Social History  . Marital Status: Single    Spouse Name: N/A  . Number of Children: 2  . Years of Education: N/A   Occupational History  . Disabled     back problems   Social History Main Topics  . Smoking status: Never Smoker   . Smokeless tobacco: Never Used  . Alcohol Use: No  . Drug Use: No  . Sexual Activity: No   Other Topics Concern  . None   Social History Narrative   Ms. Allison Sharp is disabled and lives with her sister, Allison Sharp. She has another sister with whom she has lived with in the past, and who is a Designer, jewellery, but not currently practicing.   She has two grown sons that she does not see often, as they live in other states. She has 5 grand children.   Ms. Allison Sharp has a long history of mental illness including depression,  PTSD, suicidal and homicidal ideation.   She has been obese most all of her life. Her weight has significantly impacted her QOL. She recently lost 20 lbs. By decreasing portion size & increasing proteins.    Additional History:    Sleep: Poor  Appetite:  Fair    Musculoskeletal: Strength & Muscle Tone: within normal limits Gait & Station: normal Patient leans: N/A   Psychiatric Specialty Exam: Physical Exam  Review of Systems  Musculoskeletal: Positive for back pain and joint pain.  Psychiatric/Behavioral: Positive for depression.  The patient is nervous/anxious and has insomnia.   All other systems reviewed and are negative.   Blood pressure 130/62, pulse 68, temperature 97.8 F (36.6 C), temperature source Oral, resp. rate 16, height 5\' 7"  (1.702 m), weight 158.305 kg (349 lb), last menstrual period 02/18/1995.Body mass index is 54.65 kg/(m^2).  General Appearance: Fairly Groomed  Engineer, water::  Fair  Speech:  Clear and Coherent  Volume:  Normal  Mood:  Anxious and Depressed  Affect:  Labile  Thought Process:  Coherent  Orientation:  Full (Time, Place, and Person)  Thought Content:  Rumination  Suicidal Thoughts:  No  Homicidal Thoughts:  No  Memory:  Immediate;   Fair Recent;   Fair Remote;   Fair  Judgement:  Fair  Insight:  Fair  Psychomotor Activity:  Decreased  Concentration:  Poor  Recall:  [employer] of Knowledge:Fair  Language: Fair  Akathisia:  No  Handed:  Right  AIMS (if indicated):     Assets:  Social Support  ADL's:  Intact  Cognition: WNL  Sleep:  Number of Hours: 5.25     Current Medications: Current Facility-Administered Medications  Medication Dose Route Frequency Provider Last Rate Last Dose  . albuterol (PROVENTIL HFA;VENTOLIN HFA) 108 (90 BASE) MCG/ACT inhaler 2 puff  2 puff Inhalation Q6H PRN Delfin Gant, NP   2 puff at 01/19/15 1721  . cloNIDine (CATAPRES) tablet 0.2 mg  0.2 mg Oral QHS Delfin Gant, NP   0.2 mg at  01/18/15 2151  . clotrimazole (LOTRIMIN) 1 % cream   Topical BID Ursula Alert, MD      . diclofenac sodium (VOLTAREN) 1 % transdermal gel 2 g  2 g Topical QID Encarnacion Slates, NP   2 g at 01/19/15 2115  . doxepin (SINEQUAN) capsule 10 mg  10 mg Oral QHS Ursula Alert, MD      . Derrill Memo ON 01/21/2015] DULoxetine (CYMBALTA) DR capsule 30 mg  30 mg Oral Daily Kevan Prouty, MD      . fluvoxaMINE (LUVOX) tablet 25 mg  25 mg Oral QHS Jaquesha Boroff, MD      . hydrOXYzine (ATARAX/VISTARIL) tablet 50 mg  50 mg Oral QHS PRN Ursula Alert, MD      . ibuprofen (ADVIL,MOTRIN) tablet 600 mg  600 mg Oral Q6H PRN Ursula Alert, MD   600 mg at 01/19/15 1154  . lamoTRIgine (LAMICTAL) tablet 25 mg  25 mg Oral Daily Ursula Alert, MD   25 mg at 01/20/15 0801  . levothyroxine (SYNTHROID, LEVOTHROID) tablet 112 mcg  112 mcg Oral QAC breakfast Delfin Gant, NP   112 mcg at 01/20/15 0700  . pantoprazole (PROTONIX) EC tablet 40 mg  40 mg Oral Daily Delfin Gant, NP   40 mg at 01/20/15 4132    Lab Results:  Results for orders placed or performed during the [facility] encounter of 01/17/15 (from the past 48 hour(s))  Urine culture     Status: None   Collection Time: 01/19/15  6:57 AM  Result Value Ref Range   Specimen Description URINE, RANDOM Performed at [place] Requests      Normal Performed at [place], SUGGEST RECOLLECTION Performed at [facility]    Report Status 01/20/2015 FINAL   TSH     Status: None   Collection Time: 01/19/15  7:46 PM  Result  Value Ref Range   TSH 2.994 0.350 - 4.500 uIU/mL    Comment: Performed at [place]     Status: None   Collection Time: 01/19/15  7:46 PM  Result Value Ref Range   Prothrombin Time 12.7 11.6 - 15.2 seconds   INR 0.93 0.00 - 1.49    Comment: Performed at [facility]    Physical  Findings: AIMS: Facial and Oral Movements Muscles of Facial Expression: None, normal Lips and Perioral Area: None, normal Jaw: None, normal Tongue: None, normal,Extremity Movements Upper (arms, wrists, hands, fingers): None, normal Lower (legs, knees, ankles, toes): None, normal, Trunk Movements Neck, shoulders, hips: None, normal, Overall Severity Severity of abnormal movements (highest score from questions above): None, normal Incapacitation due to abnormal movements: None, normal Patient's awareness of abnormal movements (rate only patient's report): No Awareness, Dental Status Current problems with teeth and/or dentures?: No Does patient usually wear dentures?: No  CIWA:  CIWA-Ar Total: 0 COWS:  COWS Total Score: 1  Assessment: Elara is a 26 y old CF with hx of depression , chronic pain - presented with worsening depression. Will continue to readjust medications . Will address her sleep issues .     Treatment Plan Summary: Daily contact with patient to assess and evaluate symptoms and progress in treatment and Medication management  Reviewed past medical records,treatment plan.  Will continue Lamictal 25 mg po daily for augmenting the effect of the antidepressant. Will start Luvox 25 mg po qhs for affective sx- taper off Cymbalta for lack of efficacy. Will DC Lunesta for lack of efficacy , will start Doxepin 10 mg po qhs for sleep. Pt denies hx of cardiac issues /seizures. Restarted home medications where indicated. Will continue to monitor vitals ,medication compliance and treatment side effects while patient is here.  Will monitor for medical issues as well as call consult as needed.  Reviewed labs -tsh -wnl, PT/INR - wnl. CSW will start working on disposition. Patient to be referred to CBT/IOP on DC. Patient to participate in therapeutic milieu .        Medical Decision Making:  Review of Psycho-Social Stressors (1), Review or order clinical lab tests (1), Established  Problem, Worsening (2), Review of Last Therapy Session (1), Review of Medication Regimen & Side Effects (2) and Review of New Medication or Change in Dosage (2)     Jonnette Nuon md 01/20/2015, 2:15 PM

## 2015-01-21 DIAGNOSIS — F323 Major depressive disorder, single episode, severe with psychotic features: Secondary | ICD-10-CM | POA: Diagnosis present

## 2015-01-21 MED ORDER — OLANZAPINE 5 MG PO TBDP
5.0000 mg | ORAL_TABLET | Freq: Three times a day (TID) | ORAL | Status: DC | PRN
  Administered 2015-01-21: 5 mg via ORAL
  Filled 2015-01-21 (×2): qty 1

## 2015-01-21 MED ORDER — HYDROXYZINE HCL 25 MG PO TABS
25.0000 mg | ORAL_TABLET | Freq: Every evening | ORAL | Status: DC | PRN
  Administered 2015-01-22 – 2015-01-23 (×2): 25 mg via ORAL
  Filled 2015-01-21: qty 1
  Filled 2015-01-21: qty 3
  Filled 2015-01-21: qty 1

## 2015-01-21 MED ORDER — OLANZAPINE 5 MG PO TBDP
2.5000 mg | ORAL_TABLET | Freq: Every day | ORAL | Status: DC
  Administered 2015-01-21: 2.5 mg via ORAL
  Filled 2015-01-21 (×2): qty 0.5

## 2015-01-21 MED ORDER — HYDROXYZINE HCL 25 MG PO TABS: 25.0000 mg | ORAL_TABLET | Freq: Three times a day (TID) | ORAL | Status: DC | PRN

## 2015-01-21 MED ORDER — CLONIDINE HCL 0.1 MG PO TABS
0.1000 mg | ORAL_TABLET | Freq: Every day | ORAL | Status: DC
  Administered 2015-01-21 – 2015-01-22 (×2): 0.1 mg via ORAL
  Filled 2015-01-21 (×4): qty 1

## 2015-01-21 MED ORDER — BENZTROPINE MESYLATE 0.5 MG PO TABS
0.5000 mg | ORAL_TABLET | Freq: Every day | ORAL | Status: DC
  Administered 2015-01-21 – 2015-01-22 (×2): 0.5 mg via ORAL
  Filled 2015-01-21 (×2): qty 1
  Filled 2015-01-21: qty 3
  Filled 2015-01-21 (×2): qty 1

## 2015-01-21 MED ORDER — DOXEPIN HCL 10 MG PO CAPS
20.0000 mg | ORAL_CAPSULE | Freq: Every day | ORAL | Status: DC
  Administered 2015-01-21 – 2015-01-22 (×2): 20 mg via ORAL
  Filled 2015-01-21 (×2): qty 2
  Filled 2015-01-21: qty 6
  Filled 2015-01-21 (×2): qty 2

## 2015-01-21 NOTE — Progress Notes (Addendum)
[facility] MD Progress Note  01/21/2015 1:28 PM Allison Sharp  MRN:  [id number] Subjective: Patient states " I slept better last night , but I feel dizzy this AM. I also hear voices arguing - they are my voice , but it is weird that I can hear them arguing out loud in my head and it scares me.'   Objective; Allison Sharp is a 59 year old obese Caucasian female with a long hx of depression presented to [facility] with worsening depression. Patient seen and chart reviewed.Discussed patient with treatment team.  Pt on evaluation does not seem to meet criteria for bipolar do, she denies ever having a period where she had extreme energy/financial extravagance, sleep issues due to high energy or other bipolar do sx. Pt hence was started on treatment for MDD.  Pt today continues to report sleep difficulties, but improved since yesterday . Pt also with several somatic complaints - feels dizzy , unknown if this is 2/2 medications . Will observe.VS reviewed. Pt continues to have affective sx, is depressed , anxious  And today also c/o AH arguing in her head out loud . Pt required Zyprexa 5 mg prn to calm herself down. Discussed with pt about adding an antipsychotic to help the [facility] - She agrees with plan.   Principal Problem: MDD (major depressive disorder), single episode, severe with psychotic features Diagnosis:   Patient Active Problem List   Diagnosis Date Noted  . MDD (major depressive disorder), single episode, severe with psychotic features [F32.3] 01/21/2015  . 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 Acute cystitis with hematuria [N30.01] 04/01/2014  . Bilateral knee pain [M25.561, M25.562] 01/07/2014  . Scar condition and fibrosis of skin [L90.5] 12/11/2013  . Essential hypertension, benign [I10] 07/01/2013  . Acute respiratory failure [J96.00] 11/05/2012  . Asthma with acute exacerbation [J45.901] 11/04/2012  . OSA on CPAP [G47.33] 11/04/2012  . Back pain, chronic [M54.9, G89.29] 08/27/2012  . Vitamin D  insufficiency [E55.9] 04/04/2012  . Insomnia due to mental disorder [F48.9, F51.05] 04/04/2012  . Anxiety [F41.9] 04/04/2012  . OCD (obsessive compulsive disorder) [F42] 04/04/2012  . PTSD (post-traumatic stress disorder) [F43.10] 06/28/2011  . Coronary artery disease [I25.10] 08/26/2010  . FATTY LIVER DISEASE [K76.89] 03/29/2009  . GERD [K21.9] 03/01/2009  . Morbid obesity [E66.01] 02/15/2009  . HYPOTHYROIDISM [E03.9] 02/12/2009  . HYPERCHOLESTEROLEMIA [E78.0] 02/12/2009   Total Time spent with patient: 30 minutes   Past Medical History:  Past Medical History  Diagnosis Date  . Hyperlipidemia   . CAD (coronary artery disease)   . Bipolar 1 disorder   . GERD (gastroesophageal reflux disease)   . Hyperthyroidism   . Chronic back pain   . Dyspnea     PFT 03/05/09 FEV1 2.77(98%), FVC 3.25(86%), FEV1% 85, TLC 5.88(99%), DLCO 60% ,  Methacholine challenge 03/16/09 normal ,  CT chest 03/12/09 no pulmonary disease  . Anxiety   . Arthritis   . Depression   . OSA on CPAP     2 liters  . HTN (hypertension)   . History of colonoscopy 10/17/2002    by Dr Rehman-> distal non-specific proctitis, small ext hemorrhoids,   . Fungal infection   . Cellulitis   . Contusion of sacrum   . Migraine headache   . Vitamin D deficiency   . Sleep apnea   . Myocardial infarction     NOV 1997  . Hypothyroidism     States she only has hyperthyroidism  . Complication of anesthesia     States  she typically gets sick s/p anesthesia  . Morbid obesity with body mass index of 50.0-59.9 in adult JAN 2011 370 LBS    2004 311 BMI 45.9  . Obsessive-compulsive disorder   . PTSD (post-traumatic stress disorder)   . Asthma   . Allergy   . Urine incontinence   . Chronic headaches   . Suicidal ideation   . COPD (chronic obstructive pulmonary disease)   . Schizoaffective disorder, bipolar type     Past Surgical History  Procedure Laterality Date  . Cholecystectomy    . Knee arthroscopy    .  Appendectomy    . Tonsillectomy    . Back surgery  2008  . Total vaginal hysterectomy    . Abdominal hysterectomy      sept 1996  . Tubal ligation    . Colonoscopy  10/17/2002     Distal proctitis, small external hemorrhoids, otherwise/  normal colonoscopy. Suspect rectal bleeding secondary to hemorrhoids  . Esophagogastroduodenoscopy  03/18/09    fundic gland polyps/mild gastritis  . Cardiac catheterization      nov 1997  . Joint replacement      bil knee replacement  . Hernia repair  1978   Family History:  Family History  Problem Relation Age of Onset  . Cancer Mother   . Hypertension Mother   . Bipolar disorder Mother   . Dementia Mother   . Depression Mother   . Coronary artery disease Father   . Alcohol abuse Father   . Hypertension Brother   . Coronary artery disease Brother   . Bipolar disorder Brother   . Depression Brother   . Anesthesia problems Neg Hx   . Hypotension Neg Hx   . Malignant hyperthermia Neg Hx   . Pseudochol deficiency Neg Hx   . Depression Sister   . Paranoid behavior Sister   . Bipolar disorder Sister   . Depression Sister   . Hypertension Sister   . Cancer Son     thyroid   Social History:  History  Alcohol Use No     History  Drug Use No    Social History   Social History  . Marital Status: Single    Spouse Name: N/A  . Number of Children: 2  . Years of Education: N/A   Occupational History  . Disabled     back problems   Social History Main Topics  . Smoking status: Never Smoker   . Smokeless tobacco: Never Used  . Alcohol Use: No  . Drug Use: No  . Sexual Activity: No   Other Topics Concern  . None   Social History Narrative   Allison Sharp is disabled and lives with her sister, Ashlon. She has another sister with whom she has lived with in the past, and who is a Designer, jewellery, but not currently practicing.   She has two grown sons that she does not see often, as they live in other states. She has 5 grand  children.   Allison Sharp has a long history of mental illness including depression, PTSD, suicidal and homicidal ideation.   She has been obese most all of her life. Her weight has significantly impacted her QOL. She recently lost 20 lbs. By decreasing portion size & increasing proteins.    Additional History:    Sleep: Poor  Appetite:  Fair    Musculoskeletal: Strength & Muscle Tone: within normal limits Gait & Station: normal Patient leans: N/A   Psychiatric Specialty Exam:  Physical Exam  Review of Systems  Musculoskeletal: Positive for back pain and joint pain.  Psychiatric/Behavioral: Positive for depression and hallucinations. The patient is nervous/anxious and has insomnia.   All other systems reviewed and are negative.   Blood pressure 95/60, pulse 75, temperature 97.7 F (36.5 C), temperature source Oral, resp. rate 18, height 5\' 7"  (1.702 m), weight 158.305 kg (349 lb), last menstrual period 02/18/1995.Body mass index is 54.65 kg/(m^2).  General Appearance: Fairly Groomed  Engineer, water::  Fair  Speech:  Clear and Coherent  Volume:  Normal  Mood:  Anxious and Depressed  Affect:  Labile  Thought Process:  Coherent  Orientation:  Full (Time, Place, and Person)  Thought Content:  Hallucinations: Auditory and Rumination voices arguing   Suicidal Thoughts:  No  Homicidal Thoughts:  No  Memory:  Immediate;   Fair Recent;   Fair Remote;   Fair  Judgement:  Fair  Insight:  Fair  Psychomotor Activity:  Decreased  Concentration:  Poor  Recall:  [employer] of Knowledge:Fair  Language: Fair  Akathisia:  No  Handed:  Right  AIMS (if indicated):     Assets:  Social Support  ADL's:  Intact  Cognition: WNL  Sleep:  Number of Hours: 5.25     Current Medications: Current Facility-Administered Medications  Medication Dose Route Frequency Provider Last Rate Last Dose  . albuterol (PROVENTIL HFA;VENTOLIN HFA) 108 (90 BASE) MCG/ACT inhaler 2 puff  2 puff Inhalation Q6H PRN  Delfin Gant, NP   2 puff at 01/19/15 1721  . benztropine (COGENTIN) tablet 0.5 mg  0.5 mg Oral QHS Teofil Maniaci, MD      . cloNIDine (CATAPRES) tablet 0.2 mg  0.2 mg Oral QHS Delfin Gant, NP   0.2 mg at 01/20/15 2128  . clotrimazole (LOTRIMIN) 1 % cream   Topical BID Ursula Alert, MD      . diclofenac sodium (VOLTAREN) 1 % transdermal gel 2 g  2 g Topical QID Encarnacion Slates, NP   2 g at 01/19/15 2115  . doxepin (SINEQUAN) capsule 20 mg  20 mg Oral QHS Phil Corti, MD      . fluvoxaMINE (LUVOX) tablet 25 mg  25 mg Oral QHS Ursula Alert, MD   25 mg at 01/20/15 2125  . hydrOXYzine (ATARAX/VISTARIL) tablet 25 mg  25 mg Oral TID PRN Ursula Alert, MD      . hydrOXYzine (ATARAX/VISTARIL) tablet 50 mg  50 mg Oral QHS PRN Ursula Alert, MD      . ibuprofen (ADVIL,MOTRIN) tablet 600 mg  600 mg Oral Q6H PRN Ursula Alert, MD   600 mg at 01/20/15 1650  . levothyroxine (SYNTHROID, LEVOTHROID) tablet 112 mcg  112 mcg Oral QAC breakfast Delfin Gant, NP   112 mcg at 01/21/15 0612  . OLANZapine zydis (ZYPREXA) disintegrating tablet 2.5 mg  2.5 mg Oral QHS Emilea Goga, MD      . OLANZapine zydis (ZYPREXA) disintegrating tablet 5 mg  5 mg Oral TID PRN Ursula Alert, MD   5 mg at 01/21/15 1211  . pantoprazole (PROTONIX) EC tablet 40 mg  40 mg Oral Daily Delfin Gant, NP   40 mg at 01/21/15 5427    Lab Results:  Results for orders placed or performed during the hospital encounter of 01/17/15 (from the past 48 hour(s))  TSH     Status: None   Collection Time: 01/19/15  7:46 PM  Result Value Ref Range   TSH 2.994  0.350 - 4.500 uIU/mL    Comment: Performed at [place]     Status: None   Collection Time: 01/19/15  7:46 PM  Result Value Ref Range   Prothrombin Time 12.7 11.6 - 15.2 seconds   INR 0.93 0.00 - 1.49    Comment: Performed at [facility]    Physical Findings: AIMS: Facial and Oral Movements Muscles of  Facial Expression: None, normal Lips and Perioral Area: None, normal Jaw: None, normal Tongue: None, normal,Extremity Movements Upper (arms, wrists, hands, fingers): None, normal Lower (legs, knees, ankles, toes): None, normal, Trunk Movements Neck, shoulders, hips: None, normal, Overall Severity Severity of abnormal movements (highest score from questions above): None, normal Incapacitation due to abnormal movements: None, normal Patient's awareness of abnormal movements (rate only patient's report): No Awareness, Dental Status Current problems with teeth and/or dentures?: No Does patient usually wear dentures?: No  CIWA:  CIWA-Ar Total: 0 COWS:  COWS Total Score: 1  Assessment: Emeree is a 47 y old CF with hx of depression , chronic pain - presented with worsening depression. Pt today with sleep issues as well as new complaint of AH arguing .Will continue to readjust medications .     Treatment Plan Summary: Daily contact with patient to assess and evaluate symptoms and progress in treatment and Medication management  Reviewed past medical records,treatment plan.  Will discontinue Lamictal 25 mg- lack of efficacy as well as dizziness. Will taper off cymbalta, with plan to maximize Luvox 25 mg po qhs for affective sx. Will continue Doxepin, increase to 20 mg po qhs for sleep. Pt denies hx of cardiac issues /seizures. EKG reviewed - 01/20/15. Will add Zyprexa Zydis 2.5 mg po qhs for AH.  Restarted home medications where indicated. Will continue to monitor vitals ,medication compliance and treatment side effects while patient is here.  Will monitor for medical issues as well as call consult as needed.  CSW will start working on disposition. Patient to be referred to CBT/IOP on DC. Patient to participate in therapeutic milieu .        Medical Decision Making:  Review of Psycho-Social Stressors (1), Review or order clinical lab tests (1), Established Problem, Worsening (2), Review of  Last Therapy Session (1), Review of Medication Regimen & Side Effects (2) and Review of New Medication or Change in Dosage (2)     Maninder Deboer md 01/21/2015, 1:28 PM

## 2015-01-21 NOTE — Plan of Care (Signed)
Problem: Diagnosis: Increased Risk For Suicide Attempt Goal: STG-Patient Will Comply With Medication Regime Outcome: Progressing Pt compliant with medication regimen     

## 2015-01-21 NOTE — Plan of Care (Signed)
Problem: Diagnosis: Increased Risk For Suicide Attempt Goal: STG-Patient Will Attend All Groups On The Unit Outcome: Progressing Pt attended evening group on 01/21/15

## 2015-01-21 NOTE — Plan of Care (Signed)
Problem: Ineffective individual coping Goal: STG: Patient will remain free from self harm Outcome: Progressing Pt has remained free from self harm     

## 2015-01-21 NOTE — Progress Notes (Addendum)
D:Per patient self inventory form pt reports poor sleep last night. She reports a good appetite, low energy level, poor concentration. She rates depression 8/10, hopelessness 6/10, anxiety 8/10- all on 1-10 scale, 10 being the worse. She denies SI/HI. Denies physical pain. Pt reports her goal today is "getting my medication right." Pt reports she will meet her goal by "What my doctor says." At start of shift pt denies A/V/H. As shift progressed pt presents with anxiety. "The voices in my head are arguing." Pt not attending nursing group on the unit. Using walker to assist with ambulation, gait slow, but steady.  A:Special checks q 15 mins in place for safety. Medication administered per MD order (See eMAR) PRN medication given for psychosis. Encouragement and support provided. MD notified of pt change in condition.  R:Safety maintained. No falls. Will continue to monitor.

## 2015-01-21 NOTE — BHH Group Notes (Signed)
[facility] Group Notes:  (Counselor/Nursing/MHT/Case Management/Adjunct)  01/21/2015 1:15PM  Type of Therapy:  Group Therapy  Participation Level:  Active  Participation Quality:  Appropriate  Affect:  Flat  Cognitive:  Oriented  Insight:  Improving  Engagement in Group:  Limited  Engagement in Therapy:  Limited  Modes of Intervention:  Discussion, Exploration and Socialization  Summary of Progress/Problems: The topic for group was balance in life.  Pt participated in the discussion about when their life was in balance and out of balance and how this feels.  Pt discussed ways to get back in balance and short term goals they can work on to get where they want to be. Stayed the entire time. Engaged throughout.  Started out a bit grumpy, talking about how she still is not getting enough sleep, but by the end of group was presenting as much more animated, happy.  Stated she feels unbalanced because she is still hearing voices, and still suffering from sleep deprivation.  When we talked about how to find balance, she talked about both her ability to listen to background noise of ocean sounds and she is transported to the beach that she goes to yearly at Christmas.  Also talked about volunteering at church with various functions, and helping out when others are in need of support.   [name] 01/21/2015 4:37 PM

## 2015-01-21 NOTE — Progress Notes (Signed)
Assumed care of pt at 2245.  Pt was in her room upon approach.  When asked about her day, she reports it "started out kind of rough but it got better."  Pt reports her mood is "okay" when Probation officer met with pt.  She denies SI/HI, denies hallucinations, denies pain.  Pt verbally contracts for safety and reports that she will notify staff of needs and concerns.  Will continue to monitor and assess.

## 2015-01-21 NOTE — Progress Notes (Signed)
Adult Psychoeducational Group Note  Date:  01/21/2015 Time:  11:27 PM  Group Topic/Focus:  Wrap-Up Group:   The focus of this group is to help patients review their daily goal of treatment and discuss progress on daily workbooks.  Participation Level:  Active  Participation Quality:  Appropriate and Attentive  Affect:  Appropriate  Cognitive:  Appropriate  Insight: Appropriate and Good  Engagement in Group:  Engaged  Modes of Intervention:  Education  Additional Comments:  Patient mentioned her day started out slow but she was able to pull it together. Patient goal for tomorrow is to get some rest tonight so she can have a better day tomorrow.   Jerline Pain 01/21/2015, 11:27 PM

## 2015-01-21 NOTE — BHH Group Notes (Signed)
[place] Group Notes:  (Nursing/MHT/Case Management/Adjunct)  Date:  01/21/2015  Time:0930  Type of Therapy:  Nurse Education  Participation Level:  Did Not Attend   Summary of Progress/Problems:  Allison Sharp 01/21/2015, 10:26 AM

## 2015-01-22 MED ORDER — FLUVOXAMINE MALEATE 50 MG PO TABS
50.0000 mg | ORAL_TABLET | Freq: Every day | ORAL | Status: DC
  Administered 2015-01-22: 50 mg via ORAL
  Filled 2015-01-22: qty 3
  Filled 2015-01-22 (×3): qty 1

## 2015-01-22 MED ORDER — OLANZAPINE 2.5 MG PO TABS
2.5000 mg | ORAL_TABLET | Freq: Every day | ORAL | Status: DC
  Administered 2015-01-22: 2.5 mg via ORAL
  Filled 2015-01-22: qty 3
  Filled 2015-01-22 (×3): qty 1

## 2015-01-22 NOTE — Progress Notes (Signed)
Pt reports that she slept better last night than she did on previous nights.  Denies needs and concerns at this time.  Will continue to monitor and assess.

## 2015-01-22 NOTE — BHH Group Notes (Signed)
[facility] LCSW Aftercare Discharge Planning Group Note   01/22/2015 11:16 AM  Participation Quality: Active  Mood/Affect:  Irritable  Depression Rating:  3  Anxiety Rating:  3  Thoughts of Suicide:  No Will you contract for safety?   NA  Current AVH:  No  Plan for Discharge/Comments: Allison Sharp had complaints of issues with a peer and is looking forward to going home to be at peace and away from said peer. Otherwise, she was pleasant and made appropriate contributions to the group.  Transportation Means: Being picked up by a family member   Supports: [facility], [place] B

## 2015-01-22 NOTE — Plan of Care (Signed)
Problem: Diagnosis: Increased Risk For Suicide Attempt Goal: LTG-Patient Will Report Improvement in Psychotic Symptoms LTG (by discharge) : Patient will report improvement in psychotic symptoms.  Outcome: Progressing Pt is discharging tomorrow, denies SI/HI/AVH. Pt  will follow up OP on monday

## 2015-01-22 NOTE — Progress Notes (Addendum)
[facility] MD Progress Note  01/22/2015 12:25 PM Allison Sharp  MRN:  [id number] Subjective: Patient states " I slept better last night , but I am really mad this AM. That other patient is coming and sitting beside me and calling me a "grandma " and he is very upsetting.'    Objective; Allison Sharp is a 59 year old obese Caucasian female with a long hx of depression presented to [facility] with worsening depression. Patient seen and chart reviewed.Discussed patient with treatment team.  Pt on evaluation does not seem to meet criteria for bipolar do, she denies ever having a period where she had extreme energy/financial extravagance, sleep issues due to high energy or other bipolar do sx. Pt hence was started on treatment for MDD.  Pt today with some improvement of her depressive sx. She reports sleep as good last night and that she is no longer dizzy this AM. Pt denies any AH today - she was started on Zyprexa yesterday for having arguments in her head, which she reported were  Very distressing. Pt today seen as irritable - mostly situational - from another peer who has been very intrusive on the unit. Pt otherwise denies any SI/HI.    Principal Problem: MDD (major depressive disorder), single episode, severe with psychotic features Diagnosis:   Patient Active Problem List   Diagnosis Date Noted  . MDD (major depressive disorder), single episode, severe with psychotic features [F32.3] 01/21/2015  . 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 Acute cystitis with hematuria [N30.01] 04/01/2014  . Bilateral knee pain [M25.561, M25.562] 01/07/2014  . Scar condition and fibrosis of skin [L90.5] 12/11/2013  . Essential hypertension, benign [I10] 07/01/2013  . Acute respiratory failure [J96.00] 11/05/2012  . Asthma with acute exacerbation [J45.901] 11/04/2012  . OSA on CPAP [G47.33] 11/04/2012  . Back pain, chronic [M54.9, G89.29] 08/27/2012  . Vitamin D insufficiency [E55.9] 04/04/2012  . Insomnia due to mental  disorder [F48.9, F51.05] 04/04/2012  . Anxiety [F41.9] 04/04/2012  . OCD (obsessive compulsive disorder) [F42] 04/04/2012  . PTSD (post-traumatic stress disorder) [F43.10] 06/28/2011  . Coronary artery disease [I25.10] 08/26/2010  . FATTY LIVER DISEASE [K76.89] 03/29/2009  . GERD [K21.9] 03/01/2009  . Morbid obesity [E66.01] 02/15/2009  . HYPOTHYROIDISM [E03.9] 02/12/2009  . HYPERCHOLESTEROLEMIA [E78.0] 02/12/2009   Total Time spent with patient: 30 minutes   Past Medical History:  Past Medical History  Diagnosis Date  . Hyperlipidemia   . CAD (coronary artery disease)   . Bipolar 1 disorder   . GERD (gastroesophageal reflux disease)   . Hyperthyroidism   . Chronic back pain   . Dyspnea     PFT 03/05/09 FEV1 2.77(98%), FVC 3.25(86%), FEV1% 85, TLC 5.88(99%), DLCO 60% ,  Methacholine challenge 03/16/09 normal ,  CT chest 03/12/09 no pulmonary disease  . Anxiety   . Arthritis   . Depression   . OSA on CPAP     2 liters  . HTN (hypertension)   . History of colonoscopy 10/17/2002    by Dr Rehman-> distal non-specific proctitis, small ext hemorrhoids,   . Fungal infection   . Cellulitis   . Contusion of sacrum   . Migraine headache   . Vitamin D deficiency   . Sleep apnea   . Myocardial infarction     NOV 1997  . Hypothyroidism     States she only has hyperthyroidism  . Complication of anesthesia     States she typically gets sick s/p anesthesia  . Morbid obesity with body mass  index of 50.0-59.9 in adult JAN 2011 370 LBS    2004 311 BMI 45.9  . Obsessive-compulsive disorder   . PTSD (post-traumatic stress disorder)   . Asthma   . Allergy   . Urine incontinence   . Chronic headaches   . Suicidal ideation   . COPD (chronic obstructive pulmonary disease)   . Schizoaffective disorder, bipolar type     Past Surgical History  Procedure Laterality Date  . Cholecystectomy    . Knee arthroscopy    . Appendectomy    . Tonsillectomy    . Back surgery  2008  . Total  vaginal hysterectomy    . Abdominal hysterectomy      sept 1996  . Tubal ligation    . Colonoscopy  10/17/2002     Distal proctitis, small external hemorrhoids, otherwise/  normal colonoscopy. Suspect rectal bleeding secondary to hemorrhoids  . Esophagogastroduodenoscopy  03/18/09    fundic gland polyps/mild gastritis  . Cardiac catheterization      nov 1997  . Joint replacement      bil knee replacement  . Hernia repair  1978   Family History:  Family History  Problem Relation Age of Onset  . Cancer Mother   . Hypertension Mother   . Bipolar disorder Mother   . Dementia Mother   . Depression Mother   . Coronary artery disease Father   . Alcohol abuse Father   . Hypertension Brother   . Coronary artery disease Brother   . Bipolar disorder Brother   . Depression Brother   . Anesthesia problems Neg Hx   . Hypotension Neg Hx   . Malignant hyperthermia Neg Hx   . Pseudochol deficiency Neg Hx   . Depression Sister   . Paranoid behavior Sister   . Bipolar disorder Sister   . Depression Sister   . Hypertension Sister   . Cancer Son     thyroid   Social History:  History  Alcohol Use No     History  Drug Use No    Social History   Social History  . Marital Status: Single    Spouse Name: N/A  . Number of Children: 2  . Years of Education: N/A   Occupational History  . Disabled     back problems   Social History Main Topics  . Smoking status: Never Smoker   . Smokeless tobacco: Never Used  . Alcohol Use: No  . Drug Use: No  . Sexual Activity: No   Other Topics Concern  . None   Social History Narrative   Ms. Allison Sharp is disabled and lives with her sister, Allison Sharp. She has another sister with whom she has lived with in the past, and who is a Designer, jewellery, but not currently practicing.   She has two grown sons that she does not see often, as they live in other states. She has 5 grand children.   Ms. Allison Sharp has a long history of mental illness including  depression, PTSD, suicidal and homicidal ideation.   She has been obese most all of her life. Her weight has significantly impacted her QOL. She recently lost 20 lbs. By decreasing portion size & increasing proteins.    Additional History:    Sleep: Fair  Appetite:  Fair    Musculoskeletal: Strength & Muscle Tone: within normal limits Gait & Station: normal Patient leans: N/A   Psychiatric Specialty Exam: Physical Exam  Review of Systems  Musculoskeletal: Positive for back pain and  joint pain.  Psychiatric/Behavioral: Positive for depression. Negative for hallucinations. The patient is nervous/anxious. The patient does not have insomnia.   All other systems reviewed and are negative.   Blood pressure 100/49, pulse 83, temperature 97.9 F (36.6 C), temperature source Oral, resp. rate 18, height 5\' 7"  (1.702 m), weight 158.305 kg (349 lb), last menstrual period 02/18/1995.Body mass index is 54.65 kg/(m^2).  General Appearance: Fairly Groomed  Engineer, water::  Fair  Speech:  Clear and Coherent  Volume:  Normal  Mood:  Anxious and Depressed improving  Affect:  Labile  Thought Process:  Coherent  Orientation:  Full (Time, Place, and Person)  Thought Content:  Rumination voices arguing   Suicidal Thoughts:  No  Homicidal Thoughts:  No  Memory:  Immediate;   Fair Recent;   Fair Remote;   Fair  Judgement:  Fair  Insight:  Fair  Psychomotor Activity:  Decreased  Concentration:  Fair  Recall:  [employer] of Knowledge:Fair  Language: Fair  Akathisia:  No  Handed:  Right  AIMS (if indicated):     Assets:  Social Support  ADL's:  Intact  Cognition: WNL  Sleep:  Number of Hours: 5.75     Current Medications: Current Facility-Administered Medications  Medication Dose Route Frequency Provider Last Rate Last Dose  . albuterol (PROVENTIL HFA;VENTOLIN HFA) 108 (90 BASE) MCG/ACT inhaler 2 puff  2 puff Inhalation Q6H PRN Delfin Gant, NP   2 puff at 01/19/15 1721  .  benztropine (COGENTIN) tablet 0.5 mg  0.5 mg Oral QHS Ursula Alert, MD   0.5 mg at 01/21/15 2107  . cloNIDine (CATAPRES) tablet 0.1 mg  0.1 mg Oral QHS Ursula Alert, MD   0.1 mg at 01/21/15 2107  . clotrimazole (LOTRIMIN) 1 % cream   Topical BID Ursula Alert, MD      . diclofenac sodium (VOLTAREN) 1 % transdermal gel 2 g  2 g Topical QID Encarnacion Slates, NP   2 g at 01/19/15 2115  . doxepin (SINEQUAN) capsule 20 mg  20 mg Oral QHS Ursula Alert, MD   20 mg at 01/21/15 2106  . fluvoxaMINE (LUVOX) tablet 50 mg  50 mg Oral QHS Jarryn Altland, MD      . hydrOXYzine (ATARAX/VISTARIL) tablet 25 mg  25 mg Oral TID PRN Ursula Alert, MD      . hydrOXYzine (ATARAX/VISTARIL) tablet 25 mg  25 mg Oral QHS PRN Ursula Alert, MD   25 mg at 01/22/15 0003  . ibuprofen (ADVIL,MOTRIN) tablet 600 mg  600 mg Oral Q6H PRN Ursula Alert, MD   600 mg at 01/22/15 0824  . levothyroxine (SYNTHROID, LEVOTHROID) tablet 112 mcg  112 mcg Oral QAC breakfast Delfin Gant, NP   112 mcg at 01/22/15 0617  . OLANZapine (ZYPREXA) tablet 2.5 mg  2.5 mg Oral QHS Shelise Maron, MD      . OLANZapine zydis (ZYPREXA) disintegrating tablet 5 mg  5 mg Oral TID PRN Ursula Alert, MD   5 mg at 01/21/15 1211  . pantoprazole (PROTONIX) EC tablet 40 mg  40 mg Oral Daily Delfin Gant, NP   40 mg at 01/22/15 2542    Lab Results:  No results found for this or any previous visit (from the past 48 hour(s)).  Physical Findings: AIMS: Facial and Oral Movements Muscles of Facial Expression: None, normal Lips and Perioral Area: None, normal Jaw: None, normal Tongue: None, normal,Extremity Movements Upper (arms, wrists, hands, fingers): None, normal Lower (legs,  knees, ankles, toes): None, normal, Trunk Movements Neck, shoulders, hips: None, normal, Overall Severity Severity of abnormal movements (highest score from questions above): None, normal Incapacitation due to abnormal movements: None, normal Patient's awareness of  abnormal movements (rate only patient's report): No Awareness, Dental Status Current problems with teeth and/or dentures?: No Does patient usually wear dentures?: No  CIWA:  CIWA-Ar Total: 0 COWS:  COWS Total Score: 1  Assessment: Cassanda is a 10 y old CF with hx of depression , chronic pain - presented with worsening depression. Pt today with improvement of sleep as well as affective sx .Will continue to readjust medications .     Treatment Plan Summary: Daily contact with patient to assess and evaluate symptoms and progress in treatment and Medication management  Reviewed past medical records,treatment plan.  Will increase Luvox to 50 mg po qhs for affective sx. Will continue Doxepin, increased to 20 mg po qhs for sleep. Pt denies hx of cardiac issues /seizures. EKG reviewed - 01/20/15. Will continue Zyprexa Zydis 2.5 mg po qhs for AH.  Restarted home medications where indicated. Will continue to monitor vitals ,medication compliance and treatment side effects while patient is here.  Will monitor for medical issues as well as call consult as needed.  CSW will start working on disposition. Patient to be referred to CBT/IOP on DC. Patient to participate in therapeutic milieu .    Patient to be discharged on Saturday if she continues to be stable.    Medical Decision Making:  Review of Psycho-Social Stressors (1), Review or order clinical lab tests (1), Established Problem, Worsening (2), Review of Last Therapy Session (1), Review of Medication Regimen & Side Effects (2) and Review of New Medication or Change in Dosage (2)     Haron Beilke md 01/22/2015, 12:25 PM

## 2015-01-22 NOTE — BHH Group Notes (Signed)
[place] LCSW Group Therapy  01/22/2015  1:05 PM  Type of Therapy:  Group therapy  Participation Level:  Active  Participation Quality:  Attentive  Affect:  Flat  Cognitive:  Oriented  Insight:  Limited  Engagement in Therapy:  Limited  Modes of Intervention:  Discussion, Socialization  Summary of Progress/Problems:  Chaplain was here to lead a group on themes of hope and courage. "Hope is focusing on tomorrow.  If I don't, I get stuck in a bad place.  I need to get back to me, and just let everything go."  Later, "I took some things too seriously and I don't like the way I responded to them.  I have a hard time letting things go.  But I realized I was stuck, and I had to let it go and sincerely apologize."  [name] 01/22/2015 1:27 PM

## 2015-01-22 NOTE — Progress Notes (Signed)
Patient ID: Allison Sharp, female   DOB: [birth year], 59 y.o.   MRN: [id number] D: Patient in dayroom on approach. Pt reports she is discharging tomorrow and will be returning home. Pt report she follow up OP services/appointment on Monday. Pt reports she is tolerating medication well. Pt denies SI/HI/AVH and pain. Pt attended and participated in evening wrap up group. Cooperative with assessment.  A: Met with pt 1:1. Medications administered as prescribed. Support and encouragement provided. Pt encouraged to discuss feelings and come to staff with any question or concerns.  R: Patient remains safe and complaint with medications.

## 2015-01-22 NOTE — Progress Notes (Signed)
NSG 7a-7p shift:   D:  Pt. Has been labile this shift.  This morning, she was upset with a peer and focused on going home because of this.  She stated that she had slept well last night and that her appetite was slowly improving.  "I feel that I'll be able to recover better in my own bed without all of these other people getting on my nerves".    A: Support, education, and encouragement provided as needed.  Level 3 checks continued for safety.  R: Pt.  receptive to intervention/s.  Safety maintained.  Prudencio Pair, RN

## 2015-01-22 NOTE — Progress Notes (Signed)
  [facility] Adult Case Management Discharge Plan :  Will you be returning to the same living situation after discharge:  Yes,  home At discharge, do you have transportation home?: Yes,  family Do you have the ability to pay for your medications: Yes,  insurance  Release of information consent forms completed and in the chart;  Patient's signature needed at discharge.  Patient to Follow up at: Follow-up Information    Follow up with [facility] On 02/17/2015.   Why:  Appt on this date with Dr. Adele Schilder at 10:15AM for medication management.    Contact information:   [address] [place], [place] [address] Phone: [phone] Fax: [phone]      Follow up with [facility]  On 03/03/2015.   Why:  Appt on this date at 11:00AM with [facility] for therapy.    Contact information:   [address] [place], [place] [address] Phone: [phone] Fax: [phone]      Follow up with [facility]  On 01/25/2015.   Why:  Appt on Monday with Dellia Nims at 8:45AM for Psych IOP orientation. Please make sure to bring insurance card to this appt.    Contact information:   [place], [place] [address] Phone: [phone] Fax: [phone]      Patient denies SI/HI: Yes,  yes    Safety Planning and Suicide Prevention discussed: Yes,  yes  Have you used any form of tobacco in the last 30 days? (Cigarettes, Smokeless Tobacco, Cigars, and/or Pipes): No  Has patient been referred to the Quitline?: N/A patient is not a smoker  [place], Barbaraann Rondo B 01/22/2015, 11:16 AM

## 2015-01-22 NOTE — Progress Notes (Signed)
D: Pt has appropriate affect and anxious mood.  She reports her day "started out a little slow, but it got better."  When asked about her mood, she says she's in a "pretty good mood."  Pt reports she "started hearing voices earlier, it was really me arguing with myself in my head.  I think I'm just really tired."  Pt denies hallucinations tonight.  Pt denies SI/HI, denies pain.  Pt has been visible in milieu interacting with peers and staff appropriately.  Pt attended evening group.  Pt has been ambulating with a walker tonight. A: Introduced self to pt.  Encouraged, supported, and actively listened to pt.  Medications administered per order.  PRN medication administered for sleep.   R: Pt is compliant with medications except for scheduled Voltaren gel.  Pt verbally contracts for safety.  Will continue to monitor and assess.

## 2015-01-22 NOTE — Progress Notes (Signed)
[place] Group Notes:  (Nursing/MHT/Case Management/Adjunct)  Date:  01/22/2015  Time:  9:28 PM  Type of Therapy:  Psychoeducational Skills  Participation Level:  Active  Participation Quality:  Appropriate  Affect:  Appropriate  Cognitive:  Appropriate  Insight:  Improving  Engagement in Group:  Engaged  Modes of Intervention:  Education  Summary of Progress/Problems: The patient shared with the group that she had a good day since she was able to "release things" (emotions). She anticipates being discharged tomorrow. As a theme for the day, her relapse prevention will be to "tell people no" and to concentrate on herself.   Archie Balboa S 01/22/2015, 9:28 PM

## 2015-01-22 NOTE — Tx Team (Signed)
Interdisciplinary Treatment Plan Update (Adult)  Date:  01/22/2015  Time Reviewed:  11:11 AM   Progress in Treatment: Attending groups: Yes. Participating in groups:  Yes. Taking medication as prescribed:  Yes. Tolerating medication:  Yes. Family/Significant othe contact made:  SPE required for this pt. Pt gave permission for CSW to contact her niece.  Patient understands diagnosis:  Yes. and As evidenced by:  seeking treatment for AH, depression, SI with plan, and for medication stabilization.  Discussing patient identified problems/goals with staff:  Yes. Medical problems stabilized or resolved:  Yes. Denies suicidal/homicidal ideation: Yes. Issues/concerns per patient self-inventory:  Other:  Discharge Plan or Barriers: Pt plans to return to her home where she lives with her niece and her great nephew. Pt has no current providers and would like referral for med management and therapy to [facility]  Reason for Continuation of Hospitalization:   Comments:  Allison Sharp is a 59 year old obese Caucasian female. She says she has long history of depression. Diagnosed with bipolar disorder about 8 years ago. Has been hospitalized in psychiatric hospitals for a least 3 separate times in the past. This time around, she states, "It was on Saturday evening, my niece took me to the [facility]. I have just been hearing voices talking around me. The voices were not telling me to do anything. I have been feeling very depressed, hurting emotionally, unable to get myself to poke up. I was lying down in my bed on Saturday, had my bottle of pills in my hand. Then, I thought, what if I take all the pills in these bottles. I did not take the pills, rather, I called my niece, told her, you either will come here & find me dead or you take me to the hospital. The suicidal thoughts started about 2 weeks ago. My depression has been worsening since about 4 weeks. The voices started about 2 weeks ago as well. In  1994, I attempted suicide by overdose, was hospitalized, my stomach pumped as a result. I was then transferred to this hospital where I stayed between 1-2 weeks. I was seeing a psychiatrist at the [facility] in [place], [place]. But this behavioral hospital closed as of January, 2016. I don't have a primary psychiatrist as of yet".   Estimated length of stay:  Likely d/c tomorrow   Additional Comments:  Patient and CSW reviewed pt's identified goals and treatment plan. Patient verbalized understanding and agreed to treatment plan. CSW reviewed [facility] "Discharge Process and Patient Involvement" Form. Pt verbalized understanding of information provided and signed form.    Review of initial/current patient goals per problem list:  1. Goal(s): Patient will participate in aftercare plan  Met: Yes    Target date: at discharge  As evidenced by: Patient will participate within aftercare plan AEB aftercare provider and housing plan at discharge being identified.   8/23: Pt plans to return home with niece and great-nephew. followup at [facility] 2. Goal (s): Patient will exhibit decreased depressive symptoms and suicidal ideations.  Met: Yes   Target date: at discharge  As evidenced by: Patient will utilize self rating of depression at 3 or below and demonstrate decreased signs of depression or be deemed stable for discharge by MD.  8/23: Pt rates depression as moderate this morning. No SI/HI today. Pt pleasant/calm during assessment. Goal progressing.  01/22/2015 Pt rates her depression at a 3 today.     3. Goal(s): Patient will demonstrate decreased signs of psychosis.  Met: Yes  Target Date: At discharge  As evidenced by: Patient will deny AVH and will demonstrate decreased signs of psychosis.  8/23: Pt reports muffled voices. "they are starting to get harder to hear." Pt oriented to time/person/place. Pt denies VH. Goal progressing. 01/22/2015 No signs nor symptoms  of psychosis today   Attendees: Patient:   01/22/2015 11:11 AM   Family:   01/22/2015 11:11 AM   Physician:  Ursula Alert, MD 01/22/2015 11:11 AM   Nursing:   Marcella Dubs RN 01/22/2015 11:11 AM   Clinical Social Worker: Maxie Better, [place]  01/22/2015 11:11 AM   Clinical Social Worker: Ripley Fraise 01/22/2015 11:11 AM   Other:  Gerline Legacy Nurse Case Manager 01/22/2015 11:11 AM   Other:  Lucinda Dell; [facility]  01/22/2015 11:11 AM   Other:   01/22/2015 11:11 AM   Other:  01/22/2015 11:11 AM   Other:  01/22/2015 11:11 AM   Other:  01/22/2015 11:11 AM    01/22/2015 11:11 AM    01/22/2015 11:11 AM    01/22/2015 11:11 AM    01/22/2015 11:11 AM    Scribe for Treatment Team:   Maxie Better, [place]  01/22/2015 11:11 AM

## 2015-01-22 NOTE — Progress Notes (Signed)
Adult Psychoeducational Group Note  Date:  01/22/2015 Time:  10:50 AM  Group Topic/Focus:  Crisis Planning:   The purpose of this group is to help patients create a crisis plan for use upon discharge or in the future, as needed. Relapse Prevention Planning:   The focus of this group is to define relapse and discuss the need for planning to combat relapse.  Participation Level:  Active  Participation Quality:  Attentive  Affect:  Appropriate  Cognitive:  Appropriate  Insight: Good  Engagement in Group:  Engaged  Modes of Intervention:  Discussion  Additional Comments:   Pt was attentive and she discussed that she felt as if she had a good support system.  Pt listed some people she counted on and she explained why she choose those people.  Pt discussed her plans to reach out to her support sytem today.    Paris Lore Aundraya Dripps 01/22/2015, 10:50 AM

## 2015-01-23 MED ORDER — DOXEPIN HCL 10 MG PO CAPS: 20.0000 mg | ORAL_CAPSULE | Freq: Every day | ORAL | Status: DC

## 2015-01-23 MED ORDER — FLUVOXAMINE MALEATE 50 MG PO TABS: 50.0000 mg | ORAL_TABLET | Freq: Every day | ORAL | Status: DC

## 2015-01-23 MED ORDER — BENZTROPINE MESYLATE 0.5 MG PO TABS: 0.5000 mg | ORAL_TABLET | Freq: Every day | ORAL | Status: DC

## 2015-01-23 MED ORDER — OLANZAPINE 2.5 MG PO TABS: 2.5000 mg | ORAL_TABLET | Freq: Every day | ORAL | Status: DC

## 2015-01-23 MED ORDER — OMEPRAZOLE 20 MG PO CPDR: 20.0000 mg | DELAYED_RELEASE_CAPSULE | Freq: Every day | ORAL | Status: DC

## 2015-01-23 MED ORDER — HYDROXYZINE HCL 25 MG PO TABS: 25.0000 mg | ORAL_TABLET | Freq: Every evening | ORAL | Status: DC | PRN

## 2015-01-23 MED ORDER — CLONIDINE HCL 0.1 MG PO TABS: 0.1000 mg | ORAL_TABLET | Freq: Every day | ORAL | Status: DC

## 2015-01-23 NOTE — [facility] Suicide Risk Assessment (Signed)
Allison Sharp   Demographic Factors:  Caucasian  Total Time spent with patient: 30 minutes  Musculoskeletal: Strength & Muscle Tone: within normal limits Gait & Station: uses walker  Patient leans: normal  Psychiatric Specialty Exam: Physical Exam  Review of Systems  Constitutional: Negative.   HENT: Negative.   Eyes: Negative.   Respiratory: Negative.   Cardiovascular: Negative.   Gastrointestinal: Negative.   Genitourinary: Negative.   Musculoskeletal: Positive for back pain and joint pain.  Skin: Negative.   Neurological: Negative.   Endo/Heme/Allergies: Negative.   Psychiatric/Behavioral: Positive for depression.    Blood pressure 108/70, pulse 83, temperature 97.9 F (36.6 C), temperature source Oral, resp. rate 16, height 5\' 7"  (1.702 m), weight 158.305 kg (349 lb), last menstrual period 02/18/1995.Body mass index is 54.65 kg/(m^2).  General Appearance: Fairly Groomed  Engineer, water::  Fair  Speech:  Clear and [id number]  Volume:  Normal  Mood:  Euthymic  Affect:  Appropriate  Thought Process:  Coherent and Goal Directed  Orientation:  Full (Time, Place, and Person)  Thought Content:  plans as she moves on  Suicidal Thoughts:  No  Homicidal Thoughts:  No  Memory:  Immediate;   Fair Recent;   Fair Remote;   Fair  Judgement:  Fair  Insight:  Present  Psychomotor Activity:  Normal  Concentration:  Fair  Recall:  [employer] [place]  Language: Fair  Akathisia:  No  Handed:  Right  AIMS (if indicated):     Assets:  Desire for Improvement Social Support  Sleep:  Number of Hours: 6  Cognition: WNL  ADL's:  Intact   Have you used any form of tobacco in the last 30 days? (Cigarettes, Smokeless Tobacco, Cigars, and/or Pipes): No  Has this patient used any form of tobacco in the last 30 days? (Cigarettes, Smokeless Tobacco, Cigars, and/or Pipes) No  Mental Status Per Nursing [place]::   On Admission:     Current Mental  Status by Physician: In full contact with reality. There are no active SI plans or intent. She states she is feeling much better than when she came in. States she is ready to go home   Loss Factors: Decline in physical health  Historical Factors: trauma death of friend in [place] fire  Risk Reduction Factors:   Living with another person, especially Sharp relative and Positive social support  Continued Clinical Symptoms:  Depression:   Severe Chronic Pain  Cognitive Features That Contribute To Risk:  Closed-mindedness, Polarized thinking and Thought constriction (tunnel vision)    Suicide Risk:  Minimal: No identifiable suicidal ideation.  Patients presenting with no risk factors but with morbid ruminations; may be classified as minimal risk based on the severity of the depressive symptoms  Principal Problem: MDD (major depressive disorder), single episode, severe with psychotic features Discharge Diagnoses:  Patient Active Problem List   Diagnosis Date Noted  . MDD (major depressive disorder), single episode, severe with psychotic features [F32.3] 01/21/2015  . 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 Acute cystitis with hematuria [N30.01] 04/01/2014  . Bilateral knee pain [M25.561, M25.562] 01/07/2014  . Scar condition and fibrosis of skin [L90.5] 12/11/2013  . Essential hypertension, benign [I10] 07/01/2013  . Acute respiratory failure [J96.00] 11/05/2012  . Asthma with acute exacerbation [J45.901] 11/04/2012  . OSA on CPAP [G47.33] 11/04/2012  . Back pain, chronic [M54.9, G89.29] 08/27/2012  . Vitamin D insufficiency [E55.9] 04/04/2012  . Insomnia due to mental disorder [F48.9, F51.05] 04/04/2012  .  Anxiety [F41.9] 04/04/2012  . OCD (obsessive compulsive disorder) [F42] 04/04/2012  . PTSD (post-traumatic stress disorder) [F43.10] 06/28/2011  . Coronary artery disease [I25.10] 08/26/2010  . FATTY LIVER DISEASE [K76.89] 03/29/2009  . GERD [K21.9] 03/01/2009  . Morbid obesity  [E66.01] 02/15/2009  . HYPOTHYROIDISM [E03.9] 02/12/2009  . HYPERCHOLESTEROLEMIA [E78.0] 02/12/2009    Follow-up Information    Follow up with [facility] On 02/17/2015.   Why:  Appt on this date with Dr. Adele Schilder at 10:15AM for medication management.    Contact information:   [address] [place], [place] [address] Phone: [phone] Fax: [phone]      Follow up with [facility]  On 03/03/2015.   Why:  Appt on this date at 11:00AM with [facility] for therapy.    Contact information:   [address] [place], [place] [address] Phone: [phone] Fax: [phone]      Follow up with [facility]  On 01/25/2015.   Why:  Appt on Monday with Dellia Nims at 8:45AM for Psych IOP orientation. Please make sure to bring insurance card to this appt.    Contact information:   [place], [place] [address] Phone: (RITA) [phone] Fax: [phone]      Plan Of Care/Follow-up recommendations:  Activity:  as tolerated Diet:  regular Follow up [place] as above Is patient on multiple antipsychotic therapies at discharge:  No   Has Patient had three or more failed trials of antipsychotic monotherapy by history:  No  Recommended Plan for Multiple Antipsychotic Therapies: NA    Allison Sharp 01/23/2015, 2:11 PM

## 2015-01-23 NOTE — Discharge Summary (Signed)
Physician Discharge Summary Note  Patient:  Allison Sharp is an 59 y.o., female MRN:  [id number] DOB:  Oct 03, 1955 Patient phone:  [phone] (home)  Patient address:   [place] [address],  Total Time spent with patient: 30 minutes  Date of Admission:  01/17/2015 Date of Discharge: 01/23/2015  Reason for Admission:  Depression with SI  Principal Problem: MDD (major depressive disorder), single episode, severe with psychotic features Discharge Diagnoses: Patient Active Problem List   Diagnosis Date Noted  . MDD (major depressive disorder), single episode, severe with psychotic features [F32.3] 01/21/2015  . 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 Acute cystitis with hematuria [N30.01] 04/01/2014  . Bilateral knee pain [M25.561, M25.562] 01/07/2014  . Scar condition and fibrosis of skin [L90.5] 12/11/2013  . Essential hypertension, benign [I10] 07/01/2013  . Acute respiratory failure [J96.00] 11/05/2012  . Asthma with acute exacerbation [J45.901] 11/04/2012  . OSA on CPAP [G47.33] 11/04/2012  . Back pain, chronic [M54.9, G89.29] 08/27/2012  . Vitamin D insufficiency [E55.9] 04/04/2012  . Insomnia due to mental disorder [F48.9, F51.05] 04/04/2012  . Anxiety [F41.9] 04/04/2012  . OCD (obsessive compulsive disorder) [F42] 04/04/2012  . PTSD (post-traumatic stress disorder) [F43.10] 06/28/2011  . Coronary artery disease [I25.10] 08/26/2010  . FATTY LIVER DISEASE [K76.89] 03/29/2009  . GERD [K21.9] 03/01/2009  . Morbid obesity [E66.01] 02/15/2009  . HYPOTHYROIDISM [E03.9] 02/12/2009  . HYPERCHOLESTEROLEMIA [E78.0] 02/12/2009    Musculoskeletal: Strength & Muscle Tone: within normal limits Gait & Station: uses walker Patient leans: N/A  Psychiatric Specialty Exam: Physical Exam  Review of Systems  Psychiatric/Behavioral: Positive for depression (Stabilized with treatments). Negative for suicidal ideas, hallucinations, memory loss and substance  abuse. The patient is not nervous/anxious and does not have insomnia.     Blood pressure 108/70, pulse 83, temperature 97.9 F (36.6 C), temperature source Oral, resp. rate 16, height 5\' 7"  (1.702 m), weight 158.305 kg (349 lb), last menstrual period 02/18/1995.Body mass index is 54.65 kg/(m^2).  See Physician SRA     Have you used any form of tobacco in the last 30 days? (Cigarettes, Smokeless Tobacco, Cigars, and/or Pipes): No  Has this patient used any form of tobacco in the last 30 days? (Cigarettes, Smokeless Tobacco, Cigars, and/or Pipes) No  Past Medical History:  Past Medical History  Diagnosis Date  . Hyperlipidemia   . CAD (coronary artery disease)   . Bipolar 1 disorder   . GERD (gastroesophageal reflux disease)   . Hyperthyroidism   . Chronic back pain   . Dyspnea     PFT 03/05/09 FEV1 2.77(98%), FVC 3.25(86%), FEV1% 85, TLC 5.88(99%), DLCO 60% ,  Methacholine challenge 03/16/09 normal ,  CT chest 03/12/09 no pulmonary disease  . Anxiety   . Arthritis   . Depression   . OSA on CPAP     2 liters  . HTN (hypertension)   . History of colonoscopy 10/17/2002    by Dr Rehman-> distal non-specific proctitis, small ext hemorrhoids,   . Fungal infection   . Cellulitis   . Contusion of sacrum   . Migraine headache   . Vitamin D deficiency   . Sleep apnea   . Myocardial infarction     NOV 1997  . Hypothyroidism     States she only has hyperthyroidism  . Complication of anesthesia     States she typically gets sick s/p anesthesia  . Morbid obesity with body mass index of 50.0-59.9 in adult [facility] 2011 370 LBS  2004 311 BMI 45.9  . Obsessive-compulsive disorder   . PTSD (post-traumatic stress disorder)   . Asthma   . Allergy   . Urine incontinence   . Chronic headaches   . Suicidal ideation   . COPD (chronic obstructive pulmonary disease)   . Schizoaffective disorder, bipolar type     Past Surgical History  Procedure Laterality Date  . Cholecystectomy    . Knee  arthroscopy    . Appendectomy    . Tonsillectomy    . Back surgery  2008  . Total vaginal hysterectomy    . Abdominal hysterectomy      sept 1996  . Tubal ligation    . Colonoscopy  10/17/2002     Distal proctitis, small external hemorrhoids, otherwise/  normal colonoscopy. Suspect rectal bleeding secondary to hemorrhoids  . Esophagogastroduodenoscopy  03/18/09    fundic gland polyps/mild gastritis  . Cardiac catheterization      nov 1997  . Joint replacement      bil knee replacement  . Hernia repair  1978   Family History:  Family History  Problem Relation Age of Onset  . Cancer Mother   . Hypertension Mother   . Bipolar disorder Mother   . Dementia Mother   . Depression Mother   . Coronary artery disease Father   . Alcohol abuse Father   . Hypertension Brother   . Coronary artery disease Brother   . Bipolar disorder Brother   . Depression Brother   . Anesthesia problems Neg Hx   . Hypotension Neg Hx   . Malignant hyperthermia Neg Hx   . Pseudochol deficiency Neg Hx   . Depression Sister   . Paranoid behavior Sister   . Bipolar disorder Sister   . Depression Sister   . Hypertension Sister   . Cancer Son     thyroid   Social History:  History  Alcohol Use No     History  Drug Use No    Social History   Social History  . Marital Status: Single    Spouse Name: N/A  . Number of Children: 2  . Years of Education: N/A   Occupational History  . Disabled     back problems   Social History Main Topics  . Smoking status: Never Smoker   . Smokeless tobacco: Never Used  . Alcohol Use: No  . Drug Use: No  . Sexual Activity: No   Other Topics Concern  . None   Social History Narrative   [name] is disabled and lives with her sister, Kindell. She has another sister with whom she has lived with in the past, and who is a Designer, jewellery, but not currently practicing.   She has two grown sons that she does not see often, as they live in other states. She  has 5 grand children.   [name] has a long history of mental illness including depression, PTSD, suicidal and homicidal ideation.   She has been obese most all of her life. Her weight has significantly impacted her QOL. She recently lost 20 lbs. By decreasing portion size & increasing proteins.     Risk to Self: Is patient at risk for suicide?: Yes (contracts for safety) Risk to Others:   Prior Inpatient Therapy:   Prior Outpatient Therapy:    Level of Care:  OP  Hospital Course:    ROSE HEGNER is an 59 y.o. female presenting to [facility] endorsing suicidal ideations with a plan to  overdose with her medications. Patient stated "I have just reached that point of not being able to deal". "I have been hearing voices for the past two weeks". "I knew I needed to come and get help". "I'm just as low as I have ever been in my numerous days at [facility]". Patient reported that she has attempted suicide in the past and has been hospitalized as well. Patient is currently not receiving any mental health treatment but she reported that she would like to have access to a psychiatrist and a therapist. Patient is endorsing multiple depressive symptoms and shared that her sleep and appetite are poor. Patient also reported that she is experiencing auditory hallucinations which she referred to as "background noise". Patient did not report alcohol or illicit drug abuse. Patient denies having access to weapon or firearms at this time. Patient did not report any pending criminal charges or upcoming court dates. Patient denied any physical, sexual or emotional abuse.          Alisse Tuite was admitted to the adult unit where she was evaluated and her symptoms were identified. Medication management was discussed and implemented. Patient was started on Zyprexa Zydis for auditory hallucinations. She was started on Luvox for affective symptoms. Patient received Doxepin 20 mg for insomnia.  She was encouraged to participate in  unit programming. Medical problems were identified and treated appropriately. Home medication was restarted as needed.  She was evaluated each day by a clinical provider to ascertain the patient's response to treatment.  Improvement was noted by the patient's report of decreasing symptoms, improved sleep and appetite, affect, medication tolerance, behavior, and participation in unit programming.  The patient was asked each day to complete a self inventory noting mood, mental status, pain, new symptoms, anxiety and concerns.         She responded well to medication and being in a therapeutic and supportive environment. The patient  was noted to not meet criteria for Bipolar Disorder so her diagnosis was changed to MDD.  Positive and appropriate behavior was noted and the patient was motivated for recovery.  She worked closely with the treatment team and case manager to develop a discharge plan with appropriate goals. Coping skills, problem solving as well as relaxation therapies were also part of the unit programming.         By the day of discharge she was in much improved condition than upon admission.  Symptoms were reported as significantly decreased or resolved completely. The patient denied SI/HI and voiced no AVH. She was motivated to continue taking medication with a goal of continued improvement in mental health.  Pranika Finks was discharged home with a plan to follow up as noted below. The patient was provided with sample medications and prescriptions at time of discharge. She left [facility] in stable condition with all belongings returned to her. Patient will be referred to CBT/IOP on discharge for further treatments.    Consults:  None  Significant Diagnostic Studies:  Chemistry panel, CBC, TSH, UDS negative  Discharge Vitals:   Blood pressure 108/70, pulse 83, temperature 97.9 F (36.6 C), temperature source Oral, resp. rate 16, height 5\' 7"  (1.702 m), weight 158.305 kg (349 lb), last menstrual  period 02/18/1995. Body mass index is 54.65 kg/(m^2). Lab Results:   No results found for this or any previous visit (from the past 72 hour(s)).  Physical Findings: AIMS: Facial and Oral Movements Muscles of Facial Expression: None, normal Lips and Perioral Area: None,  normal Jaw: None, normal Tongue: None, normal,Extremity Movements Upper (arms, wrists, hands, fingers): None, normal Lower (legs, knees, ankles, toes): None, normal, Trunk Movements Neck, shoulders, hips: None, normal, Overall Severity Severity of abnormal movements (highest score from questions above): None, normal Incapacitation due to abnormal movements: None, normal Patient's awareness of abnormal movements (rate only patient's report): No Awareness, Dental Status Current problems with teeth and/or dentures?: No Does patient usually wear dentures?: No  CIWA:  CIWA-Ar Total: 0 COWS:  COWS Total Score: 1   See Psychiatric Specialty Exam and Suicide Risk Assessment completed by Attending Physician prior to discharge.  Discharge destination:  Home  Is patient on multiple antipsychotic therapies at discharge:  No   Has Patient had three or more failed trials of antipsychotic monotherapy by history:  No  Recommended Plan for Multiple Antipsychotic Therapies: NA     Medication List    STOP taking these medications        celecoxib 200 MG capsule  Commonly known as:  CELEBREX     divalproex 500 MG 24 hr tablet  Commonly known as:  DEPAKOTE ER     DULoxetine 60 MG capsule  Commonly known as:  CYMBALTA     Melatonin 10 MG Caps     verapamil 180 MG CR tablet  Commonly known as:  CALAN-SR      TAKE these medications      Indication   albuterol 108 (90 BASE) MCG/ACT inhaler  Commonly known as:  PROVENTIL HFA;VENTOLIN HFA  Inhale 2 puffs into the lungs every 6 (six) hours as needed for wheezing or shortness of breath.   Indication:  Chronic Obstructive Lung Disease     benztropine 0.5 MG tablet   Commonly known as:  COGENTIN  Take 1 tablet (0.5 mg total) by mouth at bedtime.   Indication:  Arteriosclerotic Parkinsonism     bisacodyl 10 MG suppository  Commonly known as:  DULCOLAX  Place 1 suppository (10 mg total) rectally as needed for moderate constipation.      cloNIDine 0.1 MG tablet  Commonly known as:  CATAPRES  Take 1 tablet (0.1 mg total) by mouth at bedtime.   Indication:  Hot Flash     Clotrimazole 1 % Oint  Apply sparingly twice daily to rash.      doxepin 10 MG capsule  Commonly known as:  SINEQUAN  Take 2 capsules (20 mg total) by mouth at bedtime.   Indication:  Depression     fluvoxaMINE 50 MG tablet  Commonly known as:  LUVOX  Take 1 tablet (50 mg total) by mouth at bedtime.   Indication:  Depression     hydrOXYzine 25 MG tablet  Commonly known as:  ATARAX/VISTARIL  Take 1 tablet (25 mg total) by mouth at bedtime as needed (sleep).   Indication:  Sedation     levothyroxine 112 MCG tablet  Commonly known as:  SYNTHROID, LEVOTHROID  Take 1 tablet (112 mcg total) by mouth daily before breakfast. For hypothyroidism.   Indication:  Underactive Thyroid     multivitamin with minerals Tabs tablet  Take 1 tablet by mouth daily. For nutritional supplementation.   Indication:  vitamin supplementation     OLANZapine 2.5 MG tablet  Commonly known as:  ZYPREXA  Take 1 tablet (2.5 mg total) by mouth at bedtime.   Indication:  Major Depressive Disorder     omeprazole 20 MG capsule  Commonly known as:  PRILOSEC  Take 1 capsule (20 mg total) by  mouth daily.   Indication:  Gastroesophageal Reflux Disease with Current Symptoms       Follow-up Information    Follow up with [facility] On 02/17/2015.   Why:  Appt on this date with Dr. Adele Schilder at 10:15AM for medication management.    Contact information:   [address] [place], [place] [address] Phone: [phone] Fax: [phone]      Follow up with -Therapy  On  03/03/2015.   Why:  Appt on this date at 11:00AM with [facility] for therapy.    Contact information:   [address] [place], [place] [address] Phone: [phone] Fax: [phone]      Follow up with -Psych IOP  On 01/25/2015.   Why:  Appt on Monday with Dellia Nims at 8:45AM for Psych IOP orientation. Please make sure to bring insurance card to this appt.    Contact information:   [place], [place] [address] Phone: (RITA) [phone] Fax: [phone]      Follow-up recommendations:    Activity: as tolerated Diet: regular Follow up [facility] as above  Comments:   Take all your medications as prescribed by your mental healthcare provider.  Report any adverse effects and or reactions from your medicines to your outpatient provider promptly.  Patient is instructed and cautioned to not engage in alcohol and or illegal drug use while on prescription medicines.  In the event of worsening symptoms, patient is instructed to call the crisis hotline, 911 and or go to the nearest ED for appropriate evaluation and treatment of symptoms.  Follow-up with your primary care provider for your other medical issues, concerns and or health care needs.   Total Discharge Time: Greater than 30 minutes  Signed: Elmarie Shiley, NP-C 01/23/2015, 7:00 PM  I personally assessed the patient and formulated the plan Geralyn Flash A. Sabra Heck, M.D.

## 2015-01-23 NOTE — [facility] Group Notes (Signed)
[facility] Group Notes:  (Clinical Social Work)  01/23/2015  11:15-12:00PM  Summary of Progress/Problems:   The main focus of today's process group was to discuss patients' feelings related to being hospitalized, as well as the difference between "being" and "having" a mental health diagnosis.  It was agreed in general by the group that it would be preferable to avoid future hospitalizations, and we discussed means of doing that.  As a follow-up, problems with adhering to medication recommendations were discussed.  The patient expressed her primary feeling about being hospitalized is very good because this is her safety net, and she was unable to get her medications straightened out because of her provider agency closing down.  She interacted very appropriately throughout group, became visibly upset when another patient was talking negatively about depression being an "excuse" and not a legitimate illness.  Type of Therapy:  Group Therapy - Process  Participation Level:  Active  Participation Quality:  Attentive and Sharing  Affect:  Anxious and Blunted  Cognitive:  Alert and Appropriate  Insight:  Engaged  Engagement in Therapy:  Engaged  Modes of Intervention:  Exploration, Discussion  Selmer Dominion, LCSW 01/23/2015, 12:51 PM

## 2015-01-23 NOTE — Progress Notes (Signed)
Discharge D- Patient verbalizes readiness for discharge: Denies SI/HI, is not psychotic or delusional. A- Discharge instructions read and discussed with patient.  All belongings returned to patient. R- Patient cooperative with discharge process.  Patient verbalized understanding of discharge instructions.  Signed for return of belongings. Escorted to the lobby.  Patient was observed leaving with her niece.

## 2015-01-23 NOTE — [facility] Notes (Signed)
Adult Psychoeducational Group Note  Date:  01/23/2015 Time:  9:15 am  Group Topic/Focus:  Healthy Communication:   The focus of this group is to discuss communication, barriers to communication, as well as healthy ways to communicate with others.  Participation Level:  Did Not Attend  Participation Quality:  Did Not Attend  Affect:  Did Not Attend  Cognitive:  Did Not Attend  Insight: None  Engagement in Group:  Did Not Attend  Modes of Intervention:  Discussion and Education  Additional Comments:  Patient did not attend in group and was observed in the bed resting.  Donne Hazel P 01/23/2015, 10:05 AM

## 2015-01-24 ENCOUNTER — Emergency Department (HOSPITAL_COMMUNITY)
Admission: EM | Admit: 2015-01-24 | Discharge: 2015-01-24 | Disposition: A | Discharge status: Home / Self Care | Payer: Medicare Other | Attending: Emergency Medicine | Admitting: Emergency Medicine

## 2015-01-24 ENCOUNTER — Encounter (HOSPITAL_COMMUNITY): Payer: Self-pay

## 2015-01-24 DIAGNOSIS — E0590 Thyrotoxicosis, unspecified without thyrotoxic crisis or storm: Secondary | ICD-10-CM | POA: Diagnosis not present

## 2015-01-24 DIAGNOSIS — I2510 Atherosclerotic heart disease of native coronary artery without angina pectoris: Secondary | ICD-10-CM | POA: Insufficient documentation

## 2015-01-24 DIAGNOSIS — G4733 Obstructive sleep apnea (adult) (pediatric): Secondary | ICD-10-CM | POA: Insufficient documentation

## 2015-01-24 DIAGNOSIS — Z79899 Other long term (current) drug therapy: Secondary | ICD-10-CM | POA: Diagnosis not present

## 2015-01-24 DIAGNOSIS — Z8739 Personal history of other diseases of the musculoskeletal system and connective tissue: Secondary | ICD-10-CM | POA: Insufficient documentation

## 2015-01-24 DIAGNOSIS — F419 Anxiety disorder, unspecified: Secondary | ICD-10-CM | POA: Insufficient documentation

## 2015-01-24 DIAGNOSIS — Z9889 Other specified postprocedural states: Secondary | ICD-10-CM | POA: Insufficient documentation

## 2015-01-24 DIAGNOSIS — K625 Hemorrhage of anus and rectum: Secondary | ICD-10-CM

## 2015-01-24 DIAGNOSIS — I10 Essential (primary) hypertension: Secondary | ICD-10-CM | POA: Diagnosis not present

## 2015-01-24 DIAGNOSIS — Z8719 Personal history of other diseases of the digestive system: Secondary | ICD-10-CM | POA: Diagnosis not present

## 2015-01-24 DIAGNOSIS — R197 Diarrhea, unspecified: Secondary | ICD-10-CM | POA: Diagnosis not present

## 2015-01-24 DIAGNOSIS — Z9981 Dependence on supplemental oxygen: Secondary | ICD-10-CM | POA: Insufficient documentation

## 2015-01-24 DIAGNOSIS — R251 Tremor, unspecified: Secondary | ICD-10-CM | POA: Diagnosis present

## 2015-01-24 DIAGNOSIS — F319 Bipolar disorder, unspecified: Secondary | ICD-10-CM | POA: Insufficient documentation

## 2015-01-24 DIAGNOSIS — I252 Old myocardial infarction: Secondary | ICD-10-CM | POA: Diagnosis not present

## 2015-01-24 DIAGNOSIS — G8929 Other chronic pain: Secondary | ICD-10-CM | POA: Diagnosis not present

## 2015-01-24 DIAGNOSIS — Z872 Personal history of diseases of the skin and subcutaneous tissue: Secondary | ICD-10-CM | POA: Diagnosis not present

## 2015-01-24 LAB — CBC WITH DIFFERENTIAL/PLATELET
BASOS PCT: 0 % (ref 0–1)
Basophils Absolute: 0 10*3/uL (ref 0.0–0.1)
EOS ABS: 0.1 10*3/uL (ref 0.0–0.7)
Eosinophils Relative: 1 % (ref 0–5)
HCT: 43.5 % (ref 36.0–46.0)
Hemoglobin: 14.7 g/dL (ref 12.0–15.0)
Lymphocytes Relative: 37 % (ref 12–46)
Lymphs Abs: 3.4 10*3/uL (ref 0.7–4.0)
MCH: 31.2 pg (ref 26.0–34.0)
MCHC: 33.8 g/dL (ref 30.0–36.0)
MCV: 92.4 fL (ref 78.0–100.0)
MONO ABS: 0.8 10*3/uL (ref 0.1–1.0)
Monocytes Relative: 9 % (ref 3–12)
NEUTROS ABS: 4.8 10*3/uL (ref 1.7–7.7)
Neutrophils Relative %: 53 % (ref 43–77)
PLATELETS: 110 10*3/uL — AB (ref 150–400)
RBC: 4.71 MIL/uL (ref 3.87–5.11)
RDW: 13.4 % (ref 11.5–15.5)
WBC: 9.1 10*3/uL (ref 4.0–10.5)

## 2015-01-24 LAB — BASIC METABOLIC PANEL
Anion gap: 8 (ref 5–15)
BUN: 11 mg/dL (ref 6–20)
CO2: 26 mmol/L (ref 22–32)
CREATININE: 0.89 mg/dL (ref 0.44–1.00)
Calcium: 9.3 mg/dL (ref 8.9–10.3)
Chloride: 109 mmol/L (ref 101–111)
GFR calc Af Amer: >60 mL/min (ref >60)
Glucose, Bld: 102 mg/dL — ABNORMAL HIGH (ref 65–99)
Potassium: 4 mmol/L (ref 3.5–5.1)
SODIUM: 143 mmol/L (ref 135–145)

## 2015-01-24 LAB — POC OCCULT BLOOD, ED: Fecal Occult Bld: POSITIVE — AB

## 2015-01-24 MED ORDER — SODIUM CHLORIDE 0.9 % IV BOLUS (SEPSIS)
1000.0000 mL | Freq: Once | INTRAVENOUS | Status: AC
  Administered 2015-01-24: 1000 mL via INTRAVENOUS

## 2015-01-24 NOTE — ED Notes (Signed)
Pt states released from [facility] yesterday.  Pt had all meds changed and went home.  Today pt has noticed she is having shakes, feels dizzy and having some diarrhea.  Slight abdominal pain.

## 2015-01-24 NOTE — Discharge Instructions (Signed)
Bloody Stools Return for dizziness or weakness. Follow-up with your gastroenterologist. Bloody stools often mean that there is a problem in the digestive tract. Your caregiver may use the term "melena" to describe black, tarry, and bad smelling stools or "hematochezia" to describe red or maroon-colored stools. Blood seen in the stool can be caused by bleeding anywhere along the intestinal tract.  A black stool usually means that blood is coming from the upper part of the gastrointestinal tract (esophagus, stomach, or small bowel). Passing maroon-colored stools or bright red blood usually means that blood is coming from lower down in the large bowel or the rectum. However, sometimes massive bleeding in the stomach or small intestine can cause bright red bloody stools.  Consuming black licorice, lead, iron pills, medicines containing bismuth subsalicylate, or blueberries can also cause black stools. Your caregiver can test black stools to see if blood is present. It is important that the cause of the bleeding be found. Treatment can then be started, and the problem can be corrected. Rectal bleeding may not be serious, but you should not assume everything is okay until you know the cause.It is very important to follow up with your caregiver or a specialist in gastrointestinal problems. CAUSES  Blood in the stools can come from various underlying causes.Often, the cause is not found during your first visit. Testing is often needed to discover the cause of bleeding in the gastrointestinal tract. Causes range from simple to serious or even life-threatening.Possible causes include:  Hemorrhoids.These are veins that are full of blood (engorged) in the rectum. They cause pain, inflammation, and may bleed.  Anal fissures.These are areas of painful tearing which may bleed. They are often caused by passing hard stool.  Diverticulosis.These are pouches that form on the colon over time, with age, and may bleed  significantly.  Diverticulitis.This is inflammation in areas with diverticulosis. It can cause pain, fever, and bloody stools, although bleeding is rare.  Proctitis and colitis. These are inflamed areas of the rectum or colon. They may cause pain, fever, and bloody stools.  Polyps and cancer. Colon cancer is a leading cause of preventable cancer death.It often starts out as precancerous polyps that can be removed during a colonoscopy, preventing progression into cancer. Sometimes, polyps and cancer may cause rectal bleeding.  Gastritis and ulcers.Bleeding from the upper gastrointestinal tract (near the stomach) may travel through the intestines and produce black, sometimes tarry, often bad smelling stools. In certain cases, if the bleeding is fast enough, the stools may not be black, but red and the condition may be life-threatening. SYMPTOMS  You may have stools that are bright red and bloody, that are normal color with blood on them, or that are dark black and tarry. In some cases, you may only have blood in the toilet bowl. Any of these cases need medical care. You may also have:  Pain at the anus or anywhere in the rectum.  Lightheadedness or feeling faint.  Extreme weakness.  Nausea or vomiting.  Fever. DIAGNOSIS Your caregiver may use the following methods to find the cause of your bleeding:  Taking a medical history. Age is important. Older people tend to develop polyps and cancer more often. If there is anal pain and a hard, large stool associated with bleeding, a tear of the anus may be the cause. If blood drips into the toilet after a bowel movement, bleeding hemorrhoids may be the problem. The color and frequency of the bleeding are additional considerations. In most cases,  the medical history provides clues, but seldom the final answer.  A visual and finger (digital) exam. Your caregiver will inspect the anal area, looking for tears and hemorrhoids. A finger exam can provide  information when there is tenderness or a growth inside. In men, the prostate is also examined.  Endoscopy. Several types of small, long scopes (endoscopes) are used to view the colon.  In the office, your caregiver may use a rigid, or more commonly, a flexible viewing sigmoidoscope. This exam is called flexible sigmoidoscopy. It is performed in 5 to 10 minutes.  A more thorough exam is accomplished with a colonoscope. It allows your caregiver to view the entire 5 to 6 foot long colon. Medicine to help you relax (sedative) is usually given for this exam. Frequently, a bleeding lesion may be present beyond the reach of the sigmoidoscope. So, a colonoscopy may be the best exam to start with. Both exams are usually done on an outpatient basis. This means the patient does not stay overnight in the hospital or surgery center.  An upper endoscopy may be needed to examine your stomach. Sedation is used and a flexible endoscope is put in your mouth, down to your stomach.  A barium enema X-ray. This is an X-ray exam. It uses liquid barium inserted by enema into the rectum. This test alone may not identify an actual bleeding point. X-rays highlight abnormal shadows, such as those made by lumps (tumors), diverticuli, or colitis. TREATMENT  Treatment depends on the cause of your bleeding.   For bleeding from the stomach or colon, the caregiver doing your endoscopy or colonoscopy may be able to stop the bleeding as part of the procedure.  Inflammation or infection of the colon can be treated with medicines.  Many rectal problems can be treated with creams, suppositories, or warm baths.  Surgery is sometimes needed.  Blood transfusions are sometimes needed if you have lost a lot of blood.  For any bleeding problem, let your caregiver know if you take aspirin or other blood thinners regularly. HOME CARE INSTRUCTIONS   Take any medicines exactly as prescribed.  Keep your stools soft by eating a diet  high in fiber. Prunes (1 to 3 a day) work well for many people.  Drink enough water and fluids to keep your urine clear or pale yellow.  Take sitz baths if advised. A sitz bath is when you sit in a bathtub with warm water for 10 to 15 minutes to soak, soothe, and cleanse the rectal area.  If enemas or suppositories are advised, be sure you know how to use them. Tell your caregiver if you have problems with this.  Monitor your bowel movements to look for signs of improvement or worsening. SEEK MEDICAL CARE IF:   You do not improve in the time expected.  Your condition worsens after initial improvement.  You develop any new symptoms. SEEK IMMEDIATE MEDICAL CARE IF:   You develop severe or prolonged rectal bleeding.  You vomit blood.  You feel weak or faint.  You have a fever. MAKE SURE YOU:  Understand these instructions.  Will watch your condition.  Will get help right away if you are not doing well or get worse. Document Released: 05/05/2002 Document Revised: 08/07/2011 Document Reviewed: 09/30/2010 [facility] Patient Information 2015 [place], [place]. This information is not intended to replace advice given to you by your health care provider. Make sure you discuss any questions you have with your health care provider.

## 2015-01-24 NOTE — ED Provider Notes (Signed)
CSN: [id number]     Arrival date & time 01/24/15  1740 History   First MD Initiated Contact with Patient 01/24/15 1826     Chief Complaint  Patient presents with  . Tremors     (Consider location/radiation/quality/duration/timing/severity/associated sxs/prior Treatment) The history is provided by the patient and a relative. No language interpreter was used.  Allison Sharp is a 59 y.o female with a history of thyroid disease, anxiety, depression, hypertension, MI, sleep apnea, morbid obesity, OCD, PTSD, SI, and Schizoaffective disorder who presents with dizziness, tremors and diarrhea with bright red blood that began this morning. She states the blood became more dark as the day progressed and she had another bowel movement.  She was [facility] from [facility] yesterday and had most of her medications changed.  She denies any fever, chills, recent illness, recent antibiotic use, chest pain, abdominal pain, nausea, or vomiting. Past Medical History  Diagnosis Date  . Hyperlipidemia   . CAD (coronary artery disease)   . Bipolar 1 disorder   . GERD (gastroesophageal reflux disease)   . Hyperthyroidism   . Chronic back pain   . Dyspnea     PFT 03/05/09 FEV1 2.77(98%), FVC 3.25(86%), FEV1% 85, TLC 5.88(99%), DLCO 60% ,  Methacholine challenge 03/16/09 normal ,  CT chest 03/12/09 no pulmonary disease  . Anxiety   . Arthritis   . Depression   . OSA on CPAP     2 liters  . HTN (hypertension)   . History of colonoscopy 10/17/2002    by Dr Rehman-> distal non-specific proctitis, small ext hemorrhoids,   . Fungal infection   . Cellulitis   . Contusion of sacrum   . Migraine headache   . Vitamin D deficiency   . Sleep apnea   . Myocardial infarction     NOV 1997  . Hypothyroidism     States she only has hyperthyroidism  . Complication of anesthesia     States she typically gets sick s/p anesthesia  . Morbid obesity with body mass index of 50.0-59.9 in adult JAN 2011 370 LBS    2004 311  BMI 45.9  . Obsessive-compulsive disorder   . PTSD (post-traumatic stress disorder)   . Asthma   . Allergy   . Urine incontinence   . Chronic headaches   . Suicidal ideation   . COPD (chronic obstructive pulmonary disease)   . Schizoaffective disorder, bipolar type    Past Surgical History  Procedure Laterality Date  . Cholecystectomy    . Knee arthroscopy    . Appendectomy    . Tonsillectomy    . Back surgery  2008  . Total vaginal hysterectomy    . Abdominal hysterectomy      sept 1996  . Tubal ligation    . Colonoscopy  10/17/2002     Distal proctitis, small external hemorrhoids, otherwise/  normal colonoscopy. Suspect rectal bleeding secondary to hemorrhoids  . Esophagogastroduodenoscopy  03/18/09    fundic gland polyps/mild gastritis  . Cardiac catheterization      nov 1997  . Joint replacement      bil knee replacement  . Hernia repair  1978   Family History  Problem Relation Age of Onset  . Cancer Mother   . Hypertension Mother   . Bipolar disorder Mother   . Dementia Mother   . Depression Mother   . Coronary artery disease Father   . Alcohol abuse Father   . Hypertension Brother   . Coronary artery disease  Brother   . Bipolar disorder Brother   . Depression Brother   . Anesthesia problems Neg Hx   . Hypotension Neg Hx   . Malignant hyperthermia Neg Hx   . Pseudochol deficiency Neg Hx   . Depression Sister   . Paranoid behavior Sister   . Bipolar disorder Sister   . Depression Sister   . Hypertension Sister   . Cancer Son     thyroid   Social History  Substance Use Topics  . Smoking status: Never Smoker   . Smokeless tobacco: Never Used  . Alcohol Use: No   OB History    No data available     Review of Systems  Constitutional: Negative for fever.  Cardiovascular: Negative for chest pain.  Gastrointestinal: Positive for diarrhea. Negative for nausea, vomiting and abdominal pain.  Neurological: Positive for dizziness and tremors. Negative  for weakness.  All other systems reviewed and are negative.     Allergies  Haldol; Ativan; and Naproxen  Home Medications   Prior to Admission medications   Medication Sig Start Date End Date Taking? Authorizing Provider  albuterol (PROVENTIL HFA;VENTOLIN HFA) 108 (90 BASE) MCG/ACT inhaler Inhale 2 puffs into the lungs every 6 (six) hours as needed for wheezing or shortness of breath. 12/25/13 02/03/15 Yes Irene Pap, NP  benztropine (COGENTIN) 0.5 MG tablet Take 1 tablet (0.5 mg total) by mouth at bedtime. 01/23/15  Yes Niel Hummer, NP  bisacodyl (DULCOLAX) 10 MG suppository Place 1 suppository (10 mg total) rectally as needed for moderate constipation. 01/07/14  Yes Irene Pap, NP  cloNIDine (CATAPRES) 0.1 MG tablet Take 1 tablet (0.1 mg total) by mouth at bedtime. 01/23/15  Yes Niel Hummer, NP  Clotrimazole 1 % OINT Apply sparingly twice daily to rash. Patient taking differently: Apply sparingly twice daily prn for rash. 06/12/14  Yes Irene Pap, NP  doxepin (SINEQUAN) 10 MG capsule Take 2 capsules (20 mg total) by mouth at bedtime. 01/23/15  Yes Niel Hummer, NP  fluvoxaMINE (LUVOX) 50 MG tablet Take 1 tablet (50 mg total) by mouth at bedtime. 01/23/15  Yes Niel Hummer, NP  hydrOXYzine (ATARAX/VISTARIL) 25 MG tablet Take 1 tablet (25 mg total) by mouth at bedtime as needed (sleep). Patient taking differently: Take 25 mg by mouth at bedtime.  01/23/15  Yes Niel Hummer, NP  levothyroxine (SYNTHROID, LEVOTHROID) 112 MCG tablet Take 1 tablet (112 mcg total) by mouth daily before breakfast. For hypothyroidism. 11/16/14 11/16/15 Yes Irene Pap, NP  OLANZapine (ZYPREXA) 2.5 MG tablet Take 1 tablet (2.5 mg total) by mouth at bedtime. 01/23/15  Yes Niel Hummer, NP  omeprazole (PRILOSEC) 20 MG capsule Take 1 capsule (20 mg total) by mouth daily. 01/23/15  Yes Niel Hummer, NP  Multiple Vitamin (MULTIVITAMIN WITH MINERALS) TABS Take 1 tablet by mouth daily. For nutritional  supplementation. Patient not taking: Reported on 01/16/2015 10/31/12   Niel Hummer, NP   BP 161/95 mmHg  Pulse 74  Temp(Src) 98.3 F (36.8 C) (Oral)  Resp 18  SpO2 100%  LMP 02/18/1995 Physical Exam  Constitutional: She is oriented to person, place, and time. She appears well-developed and well-nourished.  HENT:  Head: Normocephalic and atraumatic.  Eyes: Conjunctivae are normal.  Neck: Normal range of motion. Neck supple.  Cardiovascular: Normal rate and regular rhythm.   Pulmonary/Chest: Effort normal. No respiratory distress.  Abdominal: Soft. There is no tenderness.  Obese abdomen.   Genitourinary: Rectal  exam shows external hemorrhoid. Rectal exam shows no fissure. Guaiac positive stool. Pelvic exam was performed with patient in the knee-chest position.  Rectal exam: Chaperone present. Moderate amount of rectal bleeding. Several external non-prolapsed hemorrhoids that are not actively bleeding. No pain on rectal exam.  Musculoskeletal: Normal range of motion.  Neurological: She is alert and oriented to person, place, and time.  Skin: Skin is warm and dry.  Psychiatric: She has a normal mood and affect. Her behavior is normal.  Nursing note and vitals reviewed.   ED Course  Procedures (including critical care time) Labs Review Labs Reviewed  CBC WITH DIFFERENTIAL/PLATELET - Abnormal; Notable for the following:    Platelets 110 (*)    All other components within normal limits  BASIC METABOLIC PANEL - Abnormal; Notable for the following:    Glucose, Bld 102 (*)    All other components within normal limits  POC OCCULT BLOOD, ED - Abnormal; Notable for the following:    Fecal Occult Bld POSITIVE (*)    All other components within normal limits    Imaging Review No results found. I have personally reviewed and evaluated these images and lab results as part of my medical decision-making.   EKG Interpretation None      MDM   Final diagnoses:  Rectal bleeding   Patient presents for rectal bleeding and diarrhea. She denies any recent anabiotic use. She has no abdominal pain, nausea, vomiting. She is a local positive but otherwise her hemoglobin and labs are normal. I do not believe imaging is necessary at this time. I have given her strict return precautions as well as gastroenterology follow-up. She states that she has a GI physician in [place] and will call make an appointment tomorrow. She is currently taking omeprazole which she is to continue taking. Medications  sodium chloride 0.9 % bolus 1,000 mL (1,000 mLs Intravenous New Bag/Given 01/24/15 2009)  [name] after fluids. She has no longer dizzy. Vitals are stable. She is well-appearing and in no acute distress.    Ottie Glazier, PA-C 01/24/15 2153  Forde Dandy, MD 01/25/15 [phone]

## 2015-02-05 ENCOUNTER — Ambulatory Visit
Admission: RE | Admit: 2015-02-05 | Discharge: 2015-02-05 | Disposition: A | Discharge status: Home / Self Care | Payer: Medicare Other | Source: Ambulatory Visit

## 2015-02-05 DIAGNOSIS — Z1231 Encounter for screening mammogram for malignant neoplasm of breast: Secondary | ICD-10-CM | POA: Diagnosis not present

## 2015-02-17 ENCOUNTER — Encounter (HOSPITAL_COMMUNITY): Payer: Self-pay | Admitting: Psychiatry

## 2015-02-17 ENCOUNTER — Ambulatory Visit (INDEPENDENT_AMBULATORY_CARE_PROVIDER_SITE_OTHER): Payer: Medicare Other | Admitting: Psychiatry

## 2015-02-17 VITALS — BP 140/82 | HR 98 | Ht 69.0 in | Wt 344.2 lb

## 2015-02-17 DIAGNOSIS — F3160 Bipolar disorder, current episode mixed, unspecified: Secondary | ICD-10-CM | POA: Diagnosis not present

## 2015-02-17 DIAGNOSIS — F4310 Post-traumatic stress disorder, unspecified: Secondary | ICD-10-CM | POA: Diagnosis not present

## 2015-02-17 MED ORDER — BENZTROPINE MESYLATE 0.5 MG PO TABS: 0.5000 mg | ORAL_TABLET | Freq: Every day | ORAL | Status: DC

## 2015-02-17 MED ORDER — DOXEPIN HCL 25 MG PO CAPS: 25.0000 mg | ORAL_CAPSULE | Freq: Every day | ORAL | Status: DC

## 2015-02-17 MED ORDER — FLUVOXAMINE MALEATE 50 MG PO TABS: 50.0000 mg | ORAL_TABLET | Freq: Every day | ORAL | Status: DC

## 2015-02-17 MED ORDER — OLANZAPINE 5 MG PO TABS: 5.0000 mg | ORAL_TABLET | Freq: Every day | ORAL | Status: DC
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
## 2015-02-27 ENCOUNTER — Other Ambulatory Visit: Payer: Self-pay

## 2015-02-27 ENCOUNTER — Emergency Department (HOSPITAL_COMMUNITY): Payer: Medicare Other

## 2015-02-27 ENCOUNTER — Encounter (HOSPITAL_COMMUNITY): Payer: Self-pay | Admitting: *Deleted

## 2015-02-27 ENCOUNTER — Emergency Department (HOSPITAL_COMMUNITY)
Admission: EM | Admit: 2015-02-27 | Discharge: 2015-02-27 | Disposition: A | Discharge status: Home / Self Care | Payer: Medicare Other | Attending: Emergency Medicine | Admitting: Emergency Medicine

## 2015-02-27 ENCOUNTER — Ambulatory Visit (INDEPENDENT_AMBULATORY_CARE_PROVIDER_SITE_OTHER): Payer: Medicare Other | Admitting: Physician Assistant

## 2015-02-27 ENCOUNTER — Encounter: Payer: Self-pay | Admitting: Physician Assistant

## 2015-02-27 VITALS — BP 189/114 | HR 92 | Temp 99.0°F | Ht 69.0 in | Wt 344.0 lb

## 2015-02-27 DIAGNOSIS — Z8619 Personal history of other infectious and parasitic diseases: Secondary | ICD-10-CM | POA: Diagnosis not present

## 2015-02-27 DIAGNOSIS — I10 Essential (primary) hypertension: Secondary | ICD-10-CM

## 2015-02-27 DIAGNOSIS — F419 Anxiety disorder, unspecified: Secondary | ICD-10-CM | POA: Diagnosis not present

## 2015-02-27 DIAGNOSIS — G8929 Other chronic pain: Secondary | ICD-10-CM | POA: Diagnosis not present

## 2015-02-27 DIAGNOSIS — Z8249 Family history of ischemic heart disease and other diseases of the circulatory system: Secondary | ICD-10-CM

## 2015-02-27 DIAGNOSIS — K219 Gastro-esophageal reflux disease without esophagitis: Secondary | ICD-10-CM | POA: Insufficient documentation

## 2015-02-27 DIAGNOSIS — Z720 Tobacco use: Secondary | ICD-10-CM | POA: Diagnosis not present

## 2015-02-27 DIAGNOSIS — Z79899 Other long term (current) drug therapy: Secondary | ICD-10-CM | POA: Diagnosis not present

## 2015-02-27 DIAGNOSIS — F4310 Post-traumatic stress disorder, unspecified: Secondary | ICD-10-CM | POA: Diagnosis not present

## 2015-02-27 DIAGNOSIS — F319 Bipolar disorder, unspecified: Secondary | ICD-10-CM | POA: Insufficient documentation

## 2015-02-27 DIAGNOSIS — I2510 Atherosclerotic heart disease of native coronary artery without angina pectoris: Secondary | ICD-10-CM | POA: Diagnosis not present

## 2015-02-27 DIAGNOSIS — G4733 Obstructive sleep apnea (adult) (pediatric): Secondary | ICD-10-CM | POA: Diagnosis not present

## 2015-02-27 DIAGNOSIS — R109 Unspecified abdominal pain: Secondary | ICD-10-CM | POA: Diagnosis not present

## 2015-02-27 DIAGNOSIS — R079 Chest pain, unspecified: Secondary | ICD-10-CM | POA: Diagnosis not present

## 2015-02-27 DIAGNOSIS — M1990 Unspecified osteoarthritis, unspecified site: Secondary | ICD-10-CM | POA: Diagnosis not present

## 2015-02-27 DIAGNOSIS — J449 Chronic obstructive pulmonary disease, unspecified: Secondary | ICD-10-CM | POA: Diagnosis not present

## 2015-02-27 DIAGNOSIS — E039 Hypothyroidism, unspecified: Secondary | ICD-10-CM | POA: Diagnosis not present

## 2015-02-27 DIAGNOSIS — R51 Headache: Secondary | ICD-10-CM | POA: Diagnosis not present

## 2015-02-27 DIAGNOSIS — Z9889 Other specified postprocedural states: Secondary | ICD-10-CM | POA: Diagnosis not present

## 2015-02-27 DIAGNOSIS — I252 Old myocardial infarction: Secondary | ICD-10-CM | POA: Insufficient documentation

## 2015-02-27 LAB — CBC WITH DIFFERENTIAL/PLATELET
BASOS ABS: 0 10*3/uL (ref 0.0–0.1)
BASOS PCT: 1 %
EOS ABS: 0.1 10*3/uL (ref 0.0–0.7)
Eosinophils Relative: 2 %
HEMATOCRIT: 42 % (ref 36.0–46.0)
HEMOGLOBIN: 14.5 g/dL (ref 12.0–15.0)
Lymphocytes Relative: 37 %
Lymphs Abs: 2.5 10*3/uL (ref 0.7–4.0)
MCH: 31 pg (ref 26.0–34.0)
MCHC: 34.5 g/dL (ref 30.0–36.0)
MCV: 89.7 fL (ref 78.0–100.0)
Monocytes Absolute: 0.4 10*3/uL (ref 0.1–1.0)
Monocytes Relative: 6 %
NEUTROS ABS: 3.8 10*3/uL (ref 1.7–7.7)
NEUTROS PCT: 54 %
Platelets: 226 10*3/uL (ref 150–400)
RBC: 4.68 MIL/uL (ref 3.87–5.11)
RDW: 13.1 % (ref 11.5–15.5)
WBC: 6.8 10*3/uL (ref 4.0–10.5)

## 2015-02-27 LAB — BASIC METABOLIC PANEL
ANION GAP: 7 (ref 5–15)
BUN: 12 mg/dL (ref 6–20)
CALCIUM: 8.9 mg/dL (ref 8.9–10.3)
CHLORIDE: 106 mmol/L (ref 101–111)
CO2: 28 mmol/L (ref 22–32)
Creatinine, Ser: 0.69 mg/dL (ref 0.44–1.00)
GFR calc non Af Amer: >60 mL/min (ref >60)
Glucose, Bld: 118 mg/dL — ABNORMAL HIGH (ref 65–99)
Potassium: 3.8 mmol/L (ref 3.5–5.1)
SODIUM: 141 mmol/L (ref 135–145)

## 2015-02-27 LAB — TROPONIN I

## 2015-02-27 MED ORDER — ACETAMINOPHEN 500 MG PO TABS
1000.0000 mg | ORAL_TABLET | Freq: Once | ORAL | Status: AC
  Administered 2015-02-27: 1000 mg via ORAL
  Filled 2015-02-27: qty 2

## 2015-02-27 MED ORDER — TRAMADOL HCL 50 MG PO TABS
50.0000 mg | ORAL_TABLET | Freq: Once | ORAL | Status: AC
  Administered 2015-02-27: 50 mg via ORAL
  Filled 2015-02-27: qty 1

## 2015-02-27 MED ORDER — CLONIDINE HCL 0.1 MG PO TABS
0.1000 mg | ORAL_TABLET | Freq: Once | ORAL | Status: AC
  Administered 2015-02-27: 0.1 mg via ORAL
  Filled 2015-02-27: qty 1

## 2015-02-27 MED ORDER — LISINOPRIL-HYDROCHLOROTHIAZIDE 20-25 MG PO TABS: 1.0000 | ORAL_TABLET | Freq: Every day | ORAL | Status: DC

## 2015-02-27 MED ORDER — OXYCODONE-ACETAMINOPHEN 5-325 MG PO TABS: 1.0000 | ORAL_TABLET | ORAL | Status: DC | PRN

## 2015-02-27 NOTE — Discharge Instructions (Signed)
Start taking your blood pressure medication. Also prescription for headache. Follow-up your primary care doctor.

## 2015-02-27 NOTE — ED Provider Notes (Signed)
CSN: [id number]     Arrival date & time 02/27/15  1208 History   First MD Initiated Contact with Patient 02/27/15 1249     Chief Complaint  Patient presents with  . Hypertension     (Consider location/radiation/quality/duration/timing/severity/associated sxs/prior Treatment) HPI....Allison KitchenMarland Sharp blood pressure has been elevated for several days. Patient reports 185/124 on Thursday. She had been on blood pressure medication in the past, but not presently. Review of systems positive for chest discomfort and headache. She reports lots of stress lately. No dyspnea, diaphoresis, nausea. Past medical history positive for a "mild MI" in 1997 and a cardiac catheterization showed minimal blockages.  Past Medical History  Diagnosis Date  . Hyperlipidemia   . CAD (coronary artery disease)   . Bipolar 1 disorder ([place])   . GERD (gastroesophageal reflux disease)   . Hyperthyroidism   . Chronic back pain   . Dyspnea     PFT 03/05/09 FEV1 2.77(98%), FVC 3.25(86%), FEV1% 85, TLC 5.88(99%), DLCO 60% ,  Methacholine challenge 03/16/09 normal ,  CT chest 03/12/09 no pulmonary disease  . Anxiety   . Arthritis   . Depression   . OSA on CPAP     2 liters  . HTN (hypertension)   . History of colonoscopy 10/17/2002    by Dr Rehman-> distal non-specific proctitis, small ext hemorrhoids,   . Fungal infection   . Cellulitis   . Contusion of sacrum   . Migraine headache   . Vitamin D deficiency   . Sleep apnea   . Myocardial infarction ([place])     NOV 1997  . Hypothyroidism     States she only has hyperthyroidism  . Complication of anesthesia     States she typically gets sick s/p anesthesia  . Morbid obesity with body mass index of 50.0-59.9 in adult [facility]) JAN 2011 370 LBS    2004 311 BMI 45.9  . PTSD (post-traumatic stress disorder)   . Asthma   . Allergy   . Urine incontinence   . Chronic headaches   . Suicidal ideation   . COPD (chronic obstructive pulmonary disease) [facility])    Past Surgical History   Procedure Laterality Date  . Cholecystectomy    . Knee arthroscopy    . Appendectomy    . Tonsillectomy    . Back surgery  2008  . Total vaginal hysterectomy    . Abdominal hysterectomy      sept 1996  . Tubal ligation    . Colonoscopy  10/17/2002     Distal proctitis, small external hemorrhoids, otherwise/  normal colonoscopy. Suspect rectal bleeding secondary to hemorrhoids  . Esophagogastroduodenoscopy  03/18/09    fundic gland polyps/mild gastritis  . Cardiac catheterization      nov 1997  . Joint replacement      bil knee replacement  . Hernia repair  1978   Family History  Problem Relation Age of Onset  . Cancer Mother   . Hypertension Mother   . Bipolar disorder Mother   . Dementia Mother   . Depression Mother   . Coronary artery disease Father   . Alcohol abuse Father   . Hypertension Brother   . Coronary artery disease Brother   . Bipolar disorder Brother   . Depression Brother   . Anesthesia problems Neg Hx   . Hypotension Neg Hx   . Malignant hyperthermia Neg Hx   . Pseudochol deficiency Neg Hx   . Depression Sister   . Paranoid behavior Sister   .  Bipolar disorder Sister   . Depression Sister   . Hypertension Sister   . Cancer Son     thyroid   Social History  Substance Use Topics  . Smoking status: Never Smoker   . Smokeless tobacco: Never Used  . Alcohol Use: No   OB History    No data available     Review of Systems  All other systems reviewed and are negative.     Allergies  Haldol; Ativan; and Naproxen  Home Medications   Prior to Admission medications   Medication Sig Start Date End Date Taking? Authorizing Provider  acetaminophen (TYLENOL) 500 MG tablet Take 1,000 mg by mouth every 6 (six) hours as needed.   Yes Historical Provider, MD  albuterol (PROVENTIL HFA;VENTOLIN HFA) 108 (90 BASE) MCG/ACT inhaler Inhale 2 puffs into the lungs every 6 (six) hours as needed for wheezing or shortness of breath. 12/25/13 02/27/15 Yes Irene Pap, NP  benztropine (COGENTIN) 0.5 MG tablet Take 1 tablet (0.5 mg total) by mouth at bedtime. 02/17/15  Yes Kathlee Nations, MD  cloNIDine (CATAPRES) 0.1 MG tablet Take 1 tablet (0.1 mg total) by mouth at bedtime. 01/23/15  Yes Niel Hummer, NP  Clotrimazole 1 % OINT Apply sparingly twice daily to rash. Patient taking differently: Apply sparingly twice daily prn for rash. 06/12/14  Yes Irene Pap, NP  doxepin (SINEQUAN) 25 MG capsule Take 1 capsule (25 mg total) by mouth at bedtime. 02/17/15  Yes Kathlee Nations, MD  fluvoxaMINE (LUVOX) 50 MG tablet Take 1 tablet (50 mg total) by mouth at bedtime. 02/17/15  Yes Kathlee Nations, MD  hydrOXYzine (ATARAX/VISTARIL) 25 MG tablet Take 25 mg by mouth daily.  01/26/15  Yes Historical Provider, MD  levothyroxine (SYNTHROID, LEVOTHROID) 112 MCG tablet Take 1 tablet (112 mcg total) by mouth daily before breakfast. For hypothyroidism. 11/16/14 11/16/15 Yes Irene Pap, NP  Multiple Vitamin (MULTIVITAMIN WITH MINERALS) TABS Take 1 tablet by mouth daily. For nutritional supplementation. 10/31/12  Yes Niel Hummer, NP  OLANZapine (ZYPREXA) 5 MG tablet Take 1 tablet (5 mg total) by mouth at bedtime. 02/17/15  Yes Kathlee Nations, MD  omeprazole (PRILOSEC) 20 MG capsule Take 1 capsule (20 mg total) by mouth daily. 01/23/15  Yes Niel Hummer, NP  lisinopril-hydrochlorothiazide (PRINZIDE,ZESTORETIC) 20-25 MG tablet Take 1 tablet by mouth daily. 02/27/15   Nat Christen, MD  oxyCODONE-acetaminophen (PERCOCET/ROXICET) 5-325 MG tablet Take 1-2 tablets by mouth every 4 (four) hours as needed for severe pain. 02/27/15   Nat Christen, MD   BP 141/82 mmHg  Pulse 78  Temp(Src) 98.1 F (36.7 C) (Oral)  Resp 18  Ht 5\' 9"  (1.753 m)  Wt 344 lb (156.037 kg)  BMI 50.78 kg/m2  SpO2 96%  LMP 02/18/1995 Physical Exam  Constitutional: She is oriented to person, place, and time.  Obese;  NAD  HENT:  Head: Normocephalic and atraumatic.  Eyes: Conjunctivae and EOM are normal. Pupils are  equal, round, and reactive to light.  Neck: Normal range of motion. Neck supple.  Cardiovascular: Normal rate and regular rhythm.   Pulmonary/Chest: Effort normal and breath sounds normal.  Abdominal: Soft. Bowel sounds are normal.  Musculoskeletal: Normal range of motion.  Neurological: She is alert and oriented to person, place, and time.  Skin: Skin is warm and dry.  Psychiatric: She has a normal mood and affect. Her behavior is normal.  Nursing note and vitals reviewed.   ED Course  Procedures (including critical care time) Labs Review Labs Reviewed  BASIC METABOLIC PANEL - Abnormal; Notable for the following:    Glucose, Bld 118 (*)    All other components within normal limits  CBC WITH DIFFERENTIAL/PLATELET  TROPONIN I    Imaging Review Dg Chest 2 View  02/27/2015   CLINICAL DATA:  Acute chest pain and hypertension  EXAM: CHEST  2 VIEW  COMPARISON:  11/04/2012  FINDINGS: Normal heart size and vascularity. No focal pneumonia, collapse or consolidation. Negative for edema, effusion or pneumothorax. Stable atherosclerosis and mild tortuosity of aorta. Small left upper lobe 10 mm nodular density versus nodule. This appears new compared 11/04/2012. Recommend follow-up nonemergent chest CT.  Trachea is midline. Degenerative changes of the spine. Monitor leads overlie the chest.  IMPRESSION: Stable chest exam without acute process  10 mm left upper lobe nodule. Recommend follow-up nonemergent chest CT.   Electronically Signed   By: Jerilynn Mages.  Shick M.D.   On: 02/27/2015 14:42   I have personally reviewed and evaluated these images and lab results as part of my medical decision-making.   EKG Interpretation None      Date: 02/27/2015  Rate: 93  Rhythm: normal sinus rhythm  QRS Axis: normal  Intervals: normal  ST/T Wave abnormalities: normal  Conduction Disutrbances: none  Narrative Interpretation: unremarkable    MDM   Final diagnoses:  Essential hypertension    Patient is in  no acute distress. Screening labs, troponin, EKG, chest x-ray showed no acute processes. Blood pressure has dramatically reduced with clonidine 0.1 mg.  Discharge medication lisinopril/hydrochlorothiazide 20/25.  She has primary care follow-up. Discussed 10 mm nodule in left upper lobe.    Nat Christen, MD 02/28/15 [phone]

## 2015-02-27 NOTE — Progress Notes (Signed)
   Subjective:    Patient ID: Allison Sharp, female    DOB: [birth year], 59 y.o.   MRN: [id number]  HPI 59 y/o female present with c/o high blood pressure. She states that her bp has been increased since Sept 21, 2016. Noticed at her MD appt. It has progressively gotten higher.     Review of Systems  Constitutional: Negative.   Eyes: Negative for visual disturbance.  Respiratory: Positive for shortness of breath.   Cardiovascular: Positive for chest pain (heaviness in her chest ) and leg swelling.  Gastrointestinal: Positive for nausea.  Neurological: Positive for dizziness (with standing ) and headaches.       Objective:   Physical Exam  Constitutional: She is oriented to person, place, and time. She appears well-developed and well-nourished. No distress.  Morbidly obese   Cardiovascular: Normal rate, regular rhythm and normal heart sounds.   No murmur heard. Hypertensive   Pulmonary/Chest: Effort normal and breath sounds normal. No respiratory distress. She has no wheezes. She has no rales. She exhibits no tenderness.  Musculoskeletal: She exhibits edema.  Neurological: She is alert and oriented to person, place, and time.  Skin: She is not diaphoretic.  Psychiatric: She has a normal mood and affect. Her behavior is normal. Judgment and thought content normal.  Nursing note and vitals reviewed.   <ECG> EKG: {left atrial enlargement RRR, no ST elevation       Assessment & Plan:  1. Essential hypertension  - lisinopril-hydrochlorothiazide (PRINZIDE,ZESTORETIC) 20-25 MG tablet; Take 1 tablet by mouth daily.  Dispense: 30 tablet; Refill: 1 - Ambulatory referral to Cardiology  2. Family history of aortic aneurysm  - Ambulatory referral to Cardiology  3. Chest pain, unspecified chest pain type  - Ambulatory referral to Cardiology  After discussion, patient plans to go to the ED when leaving office for further evaluation of cardiac enzymes. I feel that this is a good  idea since we can not test those here for acute MI. She will follow up in office in 2 weeks as well as referral to Cardiology.    Domanick Cuccia A. Benjamin Stain PA-C

## 2015-02-27 NOTE — ED Notes (Signed)
Pt states she has had high blood pressure since September which is getting worse.  Seen by PCP this morning and here blood pressure.  184/114 was told to come to the er.  Pt c/o headaches and some chest heaviness that goes through to her back.

## 2015-03-01 ENCOUNTER — Encounter: Payer: Self-pay | Admitting: Family Medicine

## 2015-03-01 ENCOUNTER — Ambulatory Visit (INDEPENDENT_AMBULATORY_CARE_PROVIDER_SITE_OTHER): Payer: Medicare Other | Admitting: Family Medicine

## 2015-03-01 ENCOUNTER — Ambulatory Visit: Payer: Self-pay | Admitting: Pediatrics

## 2015-03-01 VITALS — BP 129/92 | HR 94 | Temp 97.1°F | Ht 69.0 in | Wt 343.4 lb

## 2015-03-01 DIAGNOSIS — M1990 Unspecified osteoarthritis, unspecified site: Secondary | ICD-10-CM

## 2015-03-01 DIAGNOSIS — K219 Gastro-esophageal reflux disease without esophagitis: Secondary | ICD-10-CM

## 2015-03-01 DIAGNOSIS — M25561 Pain in right knee: Secondary | ICD-10-CM

## 2015-03-01 DIAGNOSIS — Z23 Encounter for immunization: Secondary | ICD-10-CM | POA: Diagnosis not present

## 2015-03-01 DIAGNOSIS — E039 Hypothyroidism, unspecified: Secondary | ICD-10-CM

## 2015-03-01 DIAGNOSIS — M25562 Pain in left knee: Secondary | ICD-10-CM

## 2015-03-01 DIAGNOSIS — R911 Solitary pulmonary nodule: Secondary | ICD-10-CM

## 2015-03-01 DIAGNOSIS — M19049 Primary osteoarthritis, unspecified hand: Secondary | ICD-10-CM

## 2015-03-01 MED ORDER — MELOXICAM 7.5 MG PO TABS: 7.5000 mg | ORAL_TABLET | Freq: Every day | ORAL | Status: DC

## 2015-03-01 MED ORDER — LEVOTHYROXINE SODIUM 112 MCG PO TABS: 112.0000 ug | ORAL_TABLET | Freq: Every day | ORAL | Status: DC

## 2015-03-01 MED ORDER — OMEPRAZOLE 20 MG PO CPDR: 20.0000 mg | DELAYED_RELEASE_CAPSULE | Freq: Every day | ORAL | Status: DC

## 2015-03-01 NOTE — Progress Notes (Signed)
BP 129/92 mmHg  Pulse 94  Temp(Src) 97.1 F (36.2 C) (Oral)  Ht 5\' 9"  (1.753 m)  Wt 343 lb 6.4 oz (155.765 kg)  BMI 50.69 kg/m2  LMP 02/18/1995   Subjective:    Patient ID: Allison Sharp, female    DOB: February 28, 1956, 59 y.o.   MRN: [id number]  HPI: CHARIDY CAPPELLETTI is a 59 y.o. female presenting on 03/01/2015 for Referral for chest CT and Achy joints   HPI Lung nodule Patient presents today for follow-up after a hospital visit where her chest x-ray showed a lung nodule. Lung nodule as an 11 mm nodule in the left upper lobe. The radiology recommended CT chest with contrast for follow-up. Patient denies any personal smoking history but has been around her sister who was a smoker for quite a few years. She denies any cough/chest pain/shortness of breath.  Hand pain Patient has bilateral hand pain in the MCP joints. The pain is worse as the day progresses. She denies any fevers, chills, erythema, warmth or loss of range of motion. Her main complaint is the pain with movement in those joints. She's never had rheumatic arthritis disease does not run in her family. Patient has also had bilateral knee pain for quite a few years which she seems to be arthritis.  Relevant past medical, surgical, family and social history reviewed and updated as indicated. Interim medical history since our last visit reviewed. Allergies and medications reviewed and updated.  Review of Systems  Constitutional: Negative for fever and chills.  HENT: Negative for congestion, ear discharge and ear pain.   Eyes: Negative for redness and visual disturbance.  Respiratory: Negative for cough, chest tightness, shortness of breath and wheezing.   Cardiovascular: Negative for chest pain and leg swelling.  Genitourinary: Negative for dysuria and difficulty urinating.  Musculoskeletal: Positive for arthralgias. Negative for back pain and gait problem.  Skin: Negative for rash.  Neurological: Negative for dizziness,  light-headedness and headaches.  Psychiatric/Behavioral: Negative for behavioral problems and agitation.  All other systems reviewed and are negative.   Per HPI unless specifically indicated above     Medication List       This list is accurate as of: 03/01/15 11:59 PM.  Always use your most recent med list.               acetaminophen 500 MG tablet  Commonly known as:  TYLENOL  Take 1,000 mg by mouth every 6 (six) hours as needed.     albuterol 108 (90 BASE) MCG/ACT inhaler  Commonly known as:  PROVENTIL HFA;VENTOLIN HFA  Inhale 2 puffs into the lungs every 6 (six) hours as needed for wheezing or shortness of breath.     benztropine 0.5 MG tablet  Commonly known as:  COGENTIN  Take 1 tablet (0.5 mg total) by mouth at bedtime.     cloNIDine 0.1 MG tablet  Commonly known as:  CATAPRES  Take 1 tablet (0.1 mg total) by mouth at bedtime.     Clotrimazole 1 % Oint  Apply sparingly twice daily to rash.     doxepin 25 MG capsule  Commonly known as:  SINEQUAN  Take 1 capsule (25 mg total) by mouth at bedtime.     fluvoxaMINE 50 MG tablet  Commonly known as:  LUVOX  Take 1 tablet (50 mg total) by mouth at bedtime.     hydrOXYzine 25 MG tablet  Commonly known as:  ATARAX/VISTARIL  Take 25 mg by mouth daily.  levothyroxine 112 MCG tablet  Commonly known as:  SYNTHROID, LEVOTHROID  Take 1 tablet (112 mcg total) by mouth daily before breakfast. For hypothyroidism.     lisinopril-hydrochlorothiazide 20-25 MG tablet  Commonly known as:  PRINZIDE,ZESTORETIC  Take 1 tablet by mouth daily.     meloxicam 7.5 MG tablet  Commonly known as:  MOBIC  Take 1 tablet (7.5 mg total) by mouth daily.     multivitamin with minerals Tabs tablet  Take 1 tablet by mouth daily. For nutritional supplementation.     OLANZapine 5 MG tablet  Commonly known as:  ZYPREXA  Take 1 tablet (5 mg total) by mouth at bedtime.     omeprazole 20 MG capsule  Commonly known as:  PRILOSEC  Take 1  capsule (20 mg total) by mouth daily.     oxyCODONE-acetaminophen 5-325 MG tablet  Commonly known as:  PERCOCET/ROXICET  Take 1-2 tablets by mouth every 4 (four) hours as needed for severe pain.           Objective:    BP 129/92 mmHg  Pulse 94  Temp(Src) 97.1 F (36.2 C) (Oral)  Ht 5\' 9"  (1.753 m)  Wt 343 lb 6.4 oz (155.765 kg)  BMI 50.69 kg/m2  LMP 02/18/1995  Wt Readings from Last 3 Encounters:  03/01/15 343 lb 6.4 oz (155.765 kg)  02/27/15 344 lb (156.037 kg)  02/27/15 344 lb (156.037 kg)    Physical Exam  Constitutional: She is oriented to person, place, and time. She appears well-developed and well-nourished. No distress.  Eyes: Conjunctivae and EOM are normal. Pupils are equal, round, and reactive to light.  Neck: Neck supple. No thyromegaly present.  Cardiovascular: Normal rate, regular rhythm, normal heart sounds and intact distal pulses.   No murmur heard. Pulmonary/Chest: Effort normal and breath sounds normal. No respiratory distress. She has no wheezes. She has no rales.  Musculoskeletal: Normal range of motion. She exhibits no edema or tenderness.  Pain with range of motion of bilateral MCP joints. There is minimal overlying swelling, but no erythema or warmth.  Lymphadenopathy:    She has no cervical adenopathy.  Neurological: She is alert and oriented to person, place, and time. Coordination normal.  Skin: Skin is warm and dry. No rash noted. She is not diaphoretic.  Psychiatric: She has a normal mood and affect. Her behavior is normal.  Vitals reviewed.   Results for orders placed or performed in visit on 03/01/15  Sedimentation rate  Result Value Ref Range   Sed Rate 2 0 - 40 mm/hr      Assessment & Plan:   Problem List Items Addressed This Visit      Digestive   GERD (Chronic)    Refill      Relevant Medications   omeprazole (PRILOSEC) 20 MG capsule     Other   Bilateral knee pain   Relevant Orders   Sedimentation rate (Completed)      Other Visit Diagnoses    Lung nodule seen on imaging study    -  Primary    11 mm nodule on CXR, check CT    Relevant Orders    CT Chest W Contrast    Arthritis of hand        Bilateral tenderness in MCP joints of all 4 fingers. We'll check sedimentation rate to see if related to autoimmune disease. Give Mobic    Relevant Medications    meloxicam (MOBIC) 7.5 MG tablet    Other Relevant Orders  Sedimentation rate (Completed)    Encounter for immunization        Hypothyroidism, unspecified hypothyroidism type        Refill    Relevant Medications    levothyroxine (SYNTHROID, LEVOTHROID) 112 MCG tablet        Follow up plan: Return in about 2 weeks (around 03/15/2015), or if symptoms worsen or fail to improve, for hypertension.  [name], MD [facility] 03/03/2015, 7:49 AM

## 2015-03-02 LAB — SEDIMENTATION RATE: Sed Rate: 2 mm/hr (ref 0–40)

## 2015-03-03 ENCOUNTER — Encounter (HOSPITAL_COMMUNITY): Payer: Self-pay | Admitting: Clinical

## 2015-03-03 ENCOUNTER — Ambulatory Visit (INDEPENDENT_AMBULATORY_CARE_PROVIDER_SITE_OTHER): Payer: Medicare Other | Admitting: Clinical

## 2015-03-03 DIAGNOSIS — F250 Schizoaffective disorder, bipolar type: Secondary | ICD-10-CM

## 2015-03-03 NOTE — Assessment & Plan Note (Signed)
Refill
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
## 2015-03-04 ENCOUNTER — Ambulatory Visit (HOSPITAL_COMMUNITY)
Admission: RE | Admit: 2015-03-04 | Discharge: 2015-03-04 | Disposition: A | Discharge status: Home / Self Care | Payer: Medicare Other | Source: Ambulatory Visit | Attending: Family Medicine | Admitting: Family Medicine

## 2015-03-04 DIAGNOSIS — R911 Solitary pulmonary nodule: Secondary | ICD-10-CM

## 2015-03-04 DIAGNOSIS — R0602 Shortness of breath: Secondary | ICD-10-CM | POA: Diagnosis not present

## 2015-03-04 MED ORDER — IOHEXOL 300 MG/ML  SOLN
75.0000 mL | Freq: Once | INTRAMUSCULAR | Status: AC | PRN
  Administered 2015-03-04: 75 mL via INTRAVENOUS

## 2015-03-05 ENCOUNTER — Telehealth (HOSPITAL_COMMUNITY): Payer: Self-pay

## 2015-03-05 ENCOUNTER — Encounter: Payer: Self-pay | Admitting: Family Medicine

## 2015-03-05 ENCOUNTER — Ambulatory Visit (INDEPENDENT_AMBULATORY_CARE_PROVIDER_SITE_OTHER): Payer: Medicare Other | Admitting: Family Medicine

## 2015-03-05 VITALS — BP 123/81 | HR 81 | Temp 97.4°F | Ht 69.0 in | Wt 344.4 lb

## 2015-03-05 DIAGNOSIS — R1013 Epigastric pain: Secondary | ICD-10-CM

## 2015-03-05 MED ORDER — CLONIDINE HCL 0.1 MG PO TABS: 0.1000 mg | ORAL_TABLET | Freq: Every day | ORAL | Status: DC

## 2015-03-05 NOTE — Progress Notes (Signed)
BP 123/81 mmHg  Pulse 81  Temp(Src) 97.4 F (36.3 C) (Oral)  Ht _0  (1.753 m)  Wt 344 lb 6.4 oz (156.219 kg)  BMI 50.84 kg/m2  LMP 02/18/1995   Subjective:    Patient ID: Allison Sharp, female    DOB: Jan 18, 1956, 59 y.o.   MRN: [id number]  HPI: Allison Sharp is a 59 y.o. female presenting on 03/05/2015 for Discuss CT scan   HPI Abdominal pain Patient has continued epigastric abdominal pain despite being on reflux medications. When she was here last time the pain didn't radiate into her back. It does not radiate into her back today as much. She denies any fevers or chills, she denies any nausea or vomiting, or diarrhea or constipation. She had a CT chest which showed possibly some pancreatic inflammation.  Relevant past medical, surgical, family and social history reviewed and updated as indicated. Interim medical history since our last visit reviewed. Allergies and medications reviewed and updated.  Review of Systems  Constitutional: Negative for fever and chills.  HENT: Negative for congestion, ear discharge and ear pain.   Eyes: Negative for redness and visual disturbance.  Respiratory: Negative for chest tightness and shortness of breath.   Cardiovascular: Negative for chest pain and leg swelling.  Gastrointestinal: Positive for nausea and abdominal pain. Negative for vomiting, diarrhea, constipation and blood in stool.  Genitourinary: Negative for dysuria, urgency, frequency and difficulty urinating.  Musculoskeletal: Negative for back pain and gait problem.  Skin: Negative for rash.  Neurological: Negative for light-headedness and headaches.  Psychiatric/Behavioral: Negative for behavioral problems and agitation.  All other systems reviewed and are negative.   Per HPI unless specifically indicated above     Medication List       This list is accurate as of: 03/05/15  2:50 PM.  Always use your most recent med list.               acetaminophen 500 MG  tablet  Commonly known as:  TYLENOL  Take 1,000 mg by mouth every 6 (six) hours as needed.     albuterol 108 (90 BASE) MCG/ACT inhaler  Commonly known as:  PROVENTIL HFA;VENTOLIN HFA  Inhale 2 puffs into the lungs every 6 (six) hours as needed for wheezing or shortness of breath.     benztropine 0.5 MG tablet  Commonly known as:  COGENTIN  Take 1 tablet (0.5 mg total) by mouth at bedtime.     cloNIDine 0.1 MG tablet  Commonly known as:  CATAPRES  Take 1 tablet (0.1 mg total) by mouth at bedtime.     Clotrimazole 1 % Oint  Apply sparingly twice daily to rash.     doxepin 25 MG capsule  Commonly known as:  SINEQUAN  Take 1 capsule (25 mg total) by mouth at bedtime.     fluvoxaMINE 50 MG tablet  Commonly known as:  LUVOX  Take 1 tablet (50 mg total) by mouth at bedtime.     hydrOXYzine 25 MG tablet  Commonly known as:  ATARAX/VISTARIL  Take 25 mg by mouth daily.     levothyroxine 112 MCG tablet  Commonly known as:  SYNTHROID, LEVOTHROID  Take 1 tablet (112 mcg total) by mouth daily before breakfast. For hypothyroidism.     lisinopril-hydrochlorothiazide 20-25 MG tablet  Commonly known as:  PRINZIDE,ZESTORETIC  Take 1 tablet by mouth daily.     meloxicam 7.5 MG tablet  Commonly known as:  MOBIC  Take 1 tablet (7.5  mg total) by mouth daily.     multivitamin with minerals Tabs tablet  Take 1 tablet by mouth daily. For nutritional supplementation.     OLANZapine 5 MG tablet  Commonly known as:  ZYPREXA  Take 1 tablet (5 mg total) by mouth at bedtime.     omeprazole 20 MG capsule  Commonly known as:  PRILOSEC  Take 1 capsule (20 mg total) by mouth daily.     oxyCODONE-acetaminophen 5-325 MG tablet  Commonly known as:  PERCOCET/ROXICET  Take 1-2 tablets by mouth every 4 (four) hours as needed for severe pain.           Objective:    BP 123/81 mmHg  Pulse 81  Temp(Src) 97.4 F (36.3 C) (Oral)  Ht _0  (1.753 m)  Wt 344 lb 6.4 oz (156.219 kg)  BMI 50.84  kg/m2  LMP 02/18/1995  Wt Readings from Last 3 Encounters:  03/05/15 344 lb 6.4 oz (156.219 kg)  03/01/15 343 lb 6.4 oz (155.765 kg)  02/27/15 344 lb (156.037 kg)    Physical Exam  Constitutional: She is oriented to person, place, and time. She appears well-developed and well-nourished. No distress.  Eyes: Conjunctivae and EOM are normal. Pupils are equal, round, and reactive to light.  Cardiovascular: Normal rate, regular rhythm and normal heart sounds.   No murmur heard. Pulmonary/Chest: Effort normal and breath sounds normal. No respiratory distress. She has no wheezes.  Abdominal: Soft. Bowel sounds are normal. There is no hepatosplenomegaly. There is tenderness in the epigastric area. There is no rebound, no guarding and no CVA tenderness.  Musculoskeletal: Normal range of motion. She exhibits no edema or tenderness.  Neurological: She is alert and oriented to person, place, and time. Coordination normal.  Skin: Skin is warm and dry. No rash noted. She is not diaphoretic.  Psychiatric: She has a normal mood and affect. Her behavior is normal.  Vitals reviewed.   Results for orders placed or performed in visit on 03/01/15  Sedimentation rate  Result Value Ref Range   Sed Rate 2 0 - 40 mm/hr      Assessment & Plan:   Problem List Items Addressed This Visit    None    Visit Diagnoses    Epigastric pain    -  Primary    CT chest showed possibly a little pancreatitis, we'll test labs and do a CT abdomen pelvis with contrast    Relevant Orders    Lipase    CMP14+EGFR    Bilirubin, fractionated(tot/dir/indir)    CT Abdomen Pelvis W Contrast    CBC with Differential/Platelet        Follow up plan: Return in about 3 months (around 06/05/2015), or if symptoms worsen or fail to improve, for htn f/u.  Allison Pina, MD [facility] 03/05/2015, 2:50 PM

## 2015-03-05 NOTE — Telephone Encounter (Signed)
Medication management - Faxes received from [facility] requesting refills of Hydroxyzine and Clonidine.  Dr. Adele Schilder discontinued Hydroxyzine on 02/17/15 and Dr. Salem Senate reviewed and ordered 1 time refill of Clonidine, continued by PCP 03/01/15.  [place] and spoke with Luanna Cole inform patient's Hydroxyzine was discontinued by Dr. Adele Schilder on 02/17/15 but would be e-scribing over a one time refill of patient's Clonidine until patient sees Dr. Adele Schilder again on 03/10/15.  Informed patient would need to call our office if any concerns or questions about discontinued Hydroxyzine from 02/17/15.  New one time order for patient's prescribed Clonidine, continued by PCP 03/01/15 e-scribed to patient's [facility] this date as ordered by [place].

## 2015-03-06 LAB — CBC WITH DIFFERENTIAL/PLATELET
BASOS: 1 %
Basophils Absolute: 0 10*3/uL (ref 0.0–0.2)
EOS (ABSOLUTE): 0.2 10*3/uL (ref 0.0–0.4)
Eos: 2 %
HEMOGLOBIN: 14.6 g/dL (ref 11.1–15.9)
Hematocrit: 42.8 % (ref 34.0–46.6)
IMMATURE GRANS (ABS): 0 10*3/uL (ref 0.0–0.1)
Immature Granulocytes: 0 %
LYMPHS ABS: 3 10*3/uL (ref 0.7–3.1)
LYMPHS: 37 %
MCH: 30.9 pg (ref 26.6–33.0)
MCHC: 34.1 g/dL (ref 31.5–35.7)
MCV: 91 fL (ref 79–97)
Monocytes Absolute: 0.6 10*3/uL (ref 0.1–0.9)
Monocytes: 7 %
NEUTROS ABS: 4.3 10*3/uL (ref 1.4–7.0)
Neutrophils: 53 %
PLATELETS: 246 10*3/uL (ref 150–379)
RBC: 4.73 x10E6/uL (ref 3.77–5.28)
RDW: 14.2 % (ref 12.3–15.4)
WBC: 8.1 10*3/uL (ref 3.4–10.8)

## 2015-03-06 LAB — CMP14+EGFR
A/G RATIO: 1.6 (ref 1.1–2.5)
ALBUMIN: 4.4 g/dL (ref 3.6–4.8)
ALK PHOS: 98 IU/L (ref 39–117)
ALT: 18 IU/L (ref 0–32)
AST: 14 IU/L (ref 0–40)
BILIRUBIN TOTAL: 0.4 mg/dL (ref 0.0–1.2)
BUN / CREAT RATIO: 19 (ref 11–26)
BUN: 14 mg/dL (ref 8–27)
CO2: 26 mmol/L (ref 18–29)
CREATININE: 0.74 mg/dL (ref 0.57–1.00)
Calcium: 9.9 mg/dL (ref 8.7–10.3)
Chloride: 97 mmol/L (ref 97–108)
GFR calc Af Amer: 101 mL/min/{1.73_m2} (ref >59)
GFR calc non Af Amer: 88 mL/min/{1.73_m2} (ref >59)
GLOBULIN, TOTAL: 2.7 g/dL (ref 1.5–4.5)
Glucose: 92 mg/dL (ref 65–99)
POTASSIUM: 4.3 mmol/L (ref 3.5–5.2)
SODIUM: 139 mmol/L (ref 134–144)
Total Protein: 7.1 g/dL (ref 6.0–8.5)

## 2015-03-06 LAB — LIPASE: Lipase: 32 U/L (ref 0–59)

## 2015-03-06 LAB — BILIRUBIN, FRACTIONATED(TOT/DIR/INDIR)
BILIRUBIN, DIRECT: 0.11 mg/dL (ref 0.00–0.40)
Bilirubin, Indirect: 0.29 mg/dL (ref 0.10–0.80)

## 2015-03-08 NOTE — Progress Notes (Signed)
Patient aware. Will wait for referral for ct.

## 2015-03-10 ENCOUNTER — Telehealth: Payer: Self-pay

## 2015-03-10 ENCOUNTER — Ambulatory Visit (HOSPITAL_COMMUNITY): Payer: Self-pay | Admitting: Psychiatry

## 2015-03-12 ENCOUNTER — Ambulatory Visit (HOSPITAL_COMMUNITY)
Admission: RE | Admit: 2015-03-12 | Discharge: 2015-03-12 | Disposition: A | Discharge status: Home / Self Care | Payer: Medicare Other | Source: Ambulatory Visit | Attending: Family Medicine | Admitting: Family Medicine

## 2015-03-12 DIAGNOSIS — Z9049 Acquired absence of other specified parts of digestive tract: Secondary | ICD-10-CM | POA: Diagnosis not present

## 2015-03-12 DIAGNOSIS — Q438 Other specified congenital malformations of intestine: Secondary | ICD-10-CM | POA: Diagnosis not present

## 2015-03-12 DIAGNOSIS — D3502 Benign neoplasm of left adrenal gland: Secondary | ICD-10-CM | POA: Diagnosis not present

## 2015-03-12 DIAGNOSIS — R1013 Epigastric pain: Secondary | ICD-10-CM

## 2015-03-12 DIAGNOSIS — R935 Abnormal findings on diagnostic imaging of other abdominal regions, including retroperitoneum: Secondary | ICD-10-CM | POA: Diagnosis not present

## 2015-03-12 MED ORDER — IOHEXOL 300 MG/ML  SOLN
100.0000 mL | Freq: Once | INTRAMUSCULAR | Status: AC | PRN
  Administered 2015-03-12: 100 mL via INTRAVENOUS

## 2015-03-15 ENCOUNTER — Telehealth (HOSPITAL_COMMUNITY): Payer: Self-pay

## 2015-03-15 ENCOUNTER — Ambulatory Visit: Payer: Medicare Other | Admitting: Family Medicine

## 2015-03-15 ENCOUNTER — Other Ambulatory Visit (HOSPITAL_COMMUNITY): Payer: Self-pay | Admitting: Psychiatry

## 2015-03-15 DIAGNOSIS — F3160 Bipolar disorder, current episode mixed, unspecified: Secondary | ICD-10-CM

## 2015-03-15 MED ORDER — OLANZAPINE 5 MG PO TABS: 5.0000 mg | ORAL_TABLET | Freq: Every day | ORAL | Status: DC

## 2015-03-15 MED ORDER — FLUVOXAMINE MALEATE 50 MG PO TABS: 50.0000 mg | ORAL_TABLET | Freq: Every day | ORAL | Status: DC

## 2015-03-15 MED ORDER — DOXEPIN HCL 25 MG PO CAPS: 25.0000 mg | ORAL_CAPSULE | Freq: Every day | ORAL | Status: DC

## 2015-03-15 NOTE — Telephone Encounter (Signed)
Medication refill requests by fax for patient's Doxepin and Zyprexa - Both medications last ordered on 02/17/15 with no refills and patient does not return until 03/30/15.

## 2015-03-16 ENCOUNTER — Telehealth: Payer: Self-pay | Admitting: Family Medicine

## 2015-03-16 NOTE — Progress Notes (Signed)
PT. Aware of results

## 2015-03-17 NOTE — Telephone Encounter (Signed)
Yesterday she probably follow up with them and she can also take something stronger to see if it can clear out such as milk of magnesium which is also over-the-counter. She could also try an enema. She also needs to increase her fiber intake and increase liquid intake and decrease fatty food intake. [name], MD [facility] 03/17/2015, 8:46 AM

## 2015-03-17 NOTE — Telephone Encounter (Signed)
Patient notifed. Will contact GI this am for an appt

## 2015-03-17 NOTE — Telephone Encounter (Signed)
Please advise and route to Pool A 

## 2015-03-17 NOTE — Telephone Encounter (Signed)
Patient states that she was already notified of results

## 2015-03-19 ENCOUNTER — Ambulatory Visit: Payer: Self-pay | Admitting: Internal Medicine

## 2015-03-24 ENCOUNTER — Ambulatory Visit (HOSPITAL_COMMUNITY): Payer: Self-pay | Admitting: Clinical

## 2015-03-26 ENCOUNTER — Telehealth (HOSPITAL_COMMUNITY): Payer: Self-pay

## 2015-03-26 DIAGNOSIS — F3160 Bipolar disorder, current episode mixed, unspecified: Secondary | ICD-10-CM

## 2015-03-26 MED ORDER — BENZTROPINE MESYLATE 0.5 MG PO TABS
0.5000 mg | ORAL_TABLET | Freq: Every day | ORAL | Status: DC
Start: 2015-03-26 — End: 2015-03-30

## 2015-03-26 NOTE — Telephone Encounter (Signed)
Medication refill request - Fax refill request for Cogentin received for patient order 02/17/15 with no refills but pt does not return until 03/30/15.  Dr. Adele Schilder authorized a one time refill and new 1 time order was e-scribed into pt's [facility].

## 2015-03-29 ENCOUNTER — Encounter: Payer: Self-pay | Admitting: Internal Medicine

## 2015-03-29 ENCOUNTER — Ambulatory Visit (INDEPENDENT_AMBULATORY_CARE_PROVIDER_SITE_OTHER): Payer: Medicare Other | Admitting: Internal Medicine

## 2015-03-29 VITALS — BP 120/81 | HR 103 | Ht 69.0 in | Wt 351.0 lb

## 2015-03-29 DIAGNOSIS — G4730 Sleep apnea, unspecified: Secondary | ICD-10-CM

## 2015-03-29 DIAGNOSIS — R519 Headache, unspecified: Secondary | ICD-10-CM

## 2015-03-29 DIAGNOSIS — I158 Other secondary hypertension: Secondary | ICD-10-CM | POA: Diagnosis not present

## 2015-03-29 DIAGNOSIS — R51 Headache: Secondary | ICD-10-CM | POA: Diagnosis not present

## 2015-03-29 LAB — BASIC METABOLIC PANEL
BUN: 14 mg/dL (ref 7–25)
CALCIUM: 9.9 mg/dL (ref 8.6–10.4)
CO2: 28 mmol/L (ref 20–31)
CREATININE: 0.95 mg/dL (ref 0.50–0.99)
Chloride: 96 mmol/L — ABNORMAL LOW (ref 98–110)
GLUCOSE: 99 mg/dL (ref 65–99)
Potassium: 3.7 mmol/L (ref 3.5–5.3)
Sodium: 136 mmol/L (ref 135–146)

## 2015-03-29 NOTE — Patient Instructions (Signed)
Medication Instructions:  Your physician recommends that you continue on your current medications as directed. Please refer to the Current Medication list given to you today.   Labwork: Your physician recommends that you return for lab work in: today (bmet)   Testing/Procedure:  Your physician has recommended that you have a sleep study. This test records several body functions during sleep, including: brain activity, eye movement, oxygen and carbon dioxide blood levels, heart rate and rhythm, breathing rate and rhythm, the flow of air through your mouth and nose, snoring, body muscle movements, and chest and belly movement.    Follow-Up: Follow up with Dr. Harrington Challenger pending the results of your test.  Any Other Special Instructions Will Be Listed Below (If Applicable).     If you need a refill on your cardiac medications before your next appointment, please call your pharmacy.

## 2015-03-29 NOTE — Progress Notes (Signed)
Cardiology Office Note   Date:  03/29/2015   ID:  Allison Sharp, DOB [birth year], MRN [id number]  PCP:  Worthy Rancher, MD  Cardiologist:   Dorris Carnes, MD   No chief complaint on file.     History of Present Illness: Allison Sharp is a 59 y.o. female with a history ofHTN   I saw remotely (cannot find records)Was on BP meds in past  Tapered by primary MD This summer went to give blood  BP high  Seen by primary     Rx BP med and went to ER.  Placed on lisinopril / HCTZ  Denies CP  Chronic HA  Does have sleep apnea.  Not on CPAP       Current Outpatient Prescriptions  Medication Sig Dispense Refill  . acetaminophen (TYLENOL) 500 MG tablet Take 1,000 mg by mouth every 6 (six) hours as needed for mild pain.    Marland Kitchen albuterol (PROVENTIL HFA;VENTOLIN HFA) 108 (90 BASE) MCG/ACT inhaler Inhale 2 puffs into the lungs every 6 (six) hours as needed for wheezing or shortness of breath.    . benztropine (COGENTIN) 0.5 MG tablet Take 1 tablet (0.5 mg total) by mouth at bedtime. 30 tablet 0  . cloNIDine (CATAPRES) 0.1 MG tablet Take 1 tablet (0.1 mg total) by mouth at bedtime. 30 tablet 0  . Clotrimazole 1 % OINT Apply 1 application topically 2 (two) times daily as needed (SKIN RASHES).    Marland Kitchen doxepin (SINEQUAN) 25 MG capsule Take 1 capsule (25 mg total) by mouth at bedtime. 30 capsule 0  . fluvoxaMINE (LUVOX) 50 MG tablet Take 1 tablet (50 mg total) by mouth at bedtime. 30 tablet 0  . hydrOXYzine (ATARAX/VISTARIL) 25 MG tablet Take 25 mg by mouth daily.     Marland Kitchen levothyroxine (SYNTHROID, LEVOTHROID) 112 MCG tablet Take 1 tablet (112 mcg total) by mouth daily before breakfast. For hypothyroidism. 90 tablet 2  . lisinopril-hydrochlorothiazide (PRINZIDE,ZESTORETIC) 20-25 MG tablet Take 1 tablet by mouth daily. 7 tablet 0  . meloxicam (MOBIC) 7.5 MG tablet Take 1 tablet (7.5 mg total) by mouth daily. 30 tablet 1  . Multiple Vitamin (MULTIVITAMIN WITH MINERALS) TABS Take 1 tablet by mouth daily.  For nutritional supplementation. 30 tablet 0  . OLANZapine (ZYPREXA) 5 MG tablet Take 1 tablet (5 mg total) by mouth at bedtime. 30 tablet 0  . omeprazole (PRILOSEC) 20 MG capsule Take 1 capsule (20 mg total) by mouth daily. 30 capsule 6  . oxyCODONE-acetaminophen (PERCOCET/ROXICET) 5-325 MG tablet Take 1-2 tablets by mouth every 4 (four) hours as needed for severe pain. 12 tablet 0   No current facility-administered medications for this visit.    Allergies:   Haldol; Ativan; and Naproxen   Past Medical History  Diagnosis Date  . Hyperlipidemia   . CAD (coronary artery disease)   . Bipolar 1 disorder ([place])   . GERD (gastroesophageal reflux disease)   . Hyperthyroidism   . Chronic back pain   . Dyspnea     PFT 03/05/09 FEV1 2.77(98%), FVC 3.25(86%), FEV1% 85, TLC 5.88(99%), DLCO 60% ,  Methacholine challenge 03/16/09 normal ,  CT chest 03/12/09 no pulmonary disease  . Anxiety   . Arthritis   . Depression   . OSA on CPAP     2 liters  . HTN (hypertension)   . History of colonoscopy 10/17/2002    by Dr Rehman-> distal non-specific proctitis, small ext hemorrhoids,   . Fungal infection   .  Cellulitis   . Contusion of sacrum   . Migraine headache   . Vitamin D deficiency   . Sleep apnea   . Myocardial infarction ([place])     NOV 1997  . Hypothyroidism     States she only has hyperthyroidism  . Complication of anesthesia     States she typically gets sick s/p anesthesia  . Morbid obesity with body mass index of 50.0-59.9 in adult [facility]) JAN 2011 370 LBS    2004 311 BMI 45.9  . PTSD (post-traumatic stress disorder)   . Asthma   . Allergy   . Urine incontinence   . Chronic headaches   . Suicidal ideation   . COPD (chronic obstructive pulmonary disease) [facility])     Past Surgical History  Procedure Laterality Date  . Cholecystectomy    . Knee arthroscopy    . Appendectomy    . Tonsillectomy    . Back surgery  2008  . Total vaginal hysterectomy    . Abdominal hysterectomy        sept 1996  . Tubal ligation    . Colonoscopy  10/17/2002     Distal proctitis, small external hemorrhoids, otherwise/  normal colonoscopy. Suspect rectal bleeding secondary to hemorrhoids  . Esophagogastroduodenoscopy  03/18/09    fundic gland polyps/mild gastritis  . Cardiac catheterization      nov 1997  . Joint replacement      bil knee replacement  . Hernia repair  1978     Social History:  The patient  reports that she has never smoked. She has never used smokeless tobacco. She reports that she does not drink alcohol or use illicit drugs.   Family History:  The patient's family history includes Alcohol abuse in her father; Bipolar disorder in her brother, mother, and sister; Cancer in her mother and son; Coronary artery disease in her brother and father; Dementia in her mother; Depression in her brother, mother, sister, and sister; Hypertension in her brother, mother, and sister; Paranoid behavior in her sister. There is no history of Anesthesia problems, Hypotension, Malignant hyperthermia, or Pseudochol deficiency.    ROS:  Please see the history of present illness. All other systems are reviewed and  Negative to the above problem except as noted.    PHYSICAL EXAM: VS:  BP 120/81 mmHg  Pulse 103  Ht 5\' 9"  (1.753 m)  Wt 351 lb (159.213 kg)  BMI 51.81 kg/m2  SpO2 98%  LMP 02/18/1995  GEN: Well nourished, well developed, in no acute distress HEENT: normal Neck: no JVD, carotid bruits, or masses Cardiac: RRR; no murmurs, rubs, or gallops,no edema  Respiratory:  clear to auscultation bilaterally, normal work of breathing GI: soft, nontender, nondistended, + BS  No hepatomegaly  MS: no deformity Moving all extremities   Skin: warm and dry, no rash Neuro:  Strength and sensation are intact Psych: euthymic mood, full affect   EKG:  EKG is not ordered today.  On 10/1  SR 93 bpm     Lipid Panel    Component Value Date/Time   CHOL 168 12/10/2013 1201   TRIG 150.0*  12/10/2013 1201   HDL 48.60 12/10/2013 1201   CHOLHDL 3 12/10/2013 1201   VLDL 30.0 12/10/2013 1201   LDLCALC 89 12/10/2013 1201      Wt Readings from Last 3 Encounters:  03/29/15 351 lb (159.213 kg)  03/05/15 344 lb 6.4 oz (156.219 kg)  03/01/15 343 lb 6.4 oz (155.765 kg)  ASSESSMENT AND PLAN:  1.  HTN  BP is oK today back on meds  I cannot explain why it got better then went high again.   She does have sleep apena  Not on CPAP   Would test again. Will check BMET  2.  Sleep apnea  Will set up for sleep study.     PRN f/u  Signed, Dorris Carnes, MD  03/29/2015 3:54 PM    [address], [place], [place]  [address] Phone: [phone]; Fax: [phone]

## 2015-03-30 ENCOUNTER — Ambulatory Visit (INDEPENDENT_AMBULATORY_CARE_PROVIDER_SITE_OTHER): Payer: Medicare Other | Admitting: Psychiatry

## 2015-03-30 ENCOUNTER — Encounter (HOSPITAL_COMMUNITY): Payer: Self-pay | Admitting: Psychiatry

## 2015-03-30 VITALS — BP 125/80 | HR 105 | Ht 69.0 in | Wt 352.8 lb

## 2015-03-30 DIAGNOSIS — F3160 Bipolar disorder, current episode mixed, unspecified: Secondary | ICD-10-CM | POA: Diagnosis not present

## 2015-03-30 DIAGNOSIS — Z79899 Other long term (current) drug therapy: Secondary | ICD-10-CM

## 2015-03-30 MED ORDER — DULOXETINE HCL 30 MG PO CPEP: ORAL_CAPSULE | ORAL | Status: DC

## 2015-03-30 MED ORDER — DIVALPROEX SODIUM 250 MG PO DR TAB: 250.0000 mg | DELAYED_RELEASE_TABLET | Freq: Two times a day (BID) | ORAL | Status: DC

## 2015-03-30 MED ORDER — LURASIDONE HCL 40 MG PO TABS: 40.0000 mg | ORAL_TABLET | Freq: Every day | ORAL | Status: DC
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
## 2015-03-31 LAB — HEMOGLOBIN A1C
HEMOGLOBIN A1C: 5.8 % — AB (ref <5.7)
MEAN PLASMA GLUCOSE: 120 mg/dL — AB (ref <117)

## 2015-04-02 ENCOUNTER — Telehealth (HOSPITAL_COMMUNITY): Payer: Self-pay

## 2015-04-02 NOTE — Telephone Encounter (Signed)
Telephone call with Stew, pharmacist at [facility], per request of Dr. Adele Schilder to inform them patient should be getting Clonidiine from her PCP or cardiologist as he reports Dr. Salem Senate filled one time until patient could get back in with her PCP and would now need them ordering this medication.  Pharmacist agreed to inform patient and will have her call our office if any problems.

## 2015-04-05 ENCOUNTER — Encounter: Payer: Self-pay | Admitting: Gastroenterology

## 2015-04-05 ENCOUNTER — Telehealth: Payer: Self-pay | Admitting: Gastroenterology

## 2015-04-05 ENCOUNTER — Other Ambulatory Visit: Payer: Self-pay | Admitting: *Deleted

## 2015-04-05 ENCOUNTER — Ambulatory Visit: Payer: Self-pay | Admitting: Gastroenterology

## 2015-04-05 MED ORDER — CLONIDINE HCL 0.1 MG PO TABS: 0.1000 mg | ORAL_TABLET | Freq: Every day | ORAL | Status: DC

## 2015-04-05 NOTE — Telephone Encounter (Signed)
PATIENT WAS A NO SHOW AND LETTER SENT  °

## 2015-04-07 ENCOUNTER — Ambulatory Visit (HOSPITAL_COMMUNITY): Payer: Self-pay | Admitting: Clinical

## 2015-04-09 ENCOUNTER — Other Ambulatory Visit (HOSPITAL_COMMUNITY): Payer: Self-pay | Admitting: *Deleted

## 2015-04-09 NOTE — Telephone Encounter (Signed)
Refill request for Doxepin received. Chart reviewed, order was discontinued on 03/30/15 by Dr. Adele Schilder. Pharmacy notified that refill can not be given.

## 2015-04-16 ENCOUNTER — Telehealth (HOSPITAL_COMMUNITY): Payer: Self-pay

## 2015-04-16 ENCOUNTER — Ambulatory Visit (HOSPITAL_COMMUNITY): Payer: Self-pay | Admitting: Psychiatry

## 2015-04-16 NOTE — Telephone Encounter (Signed)
Telehphone call with Nicole Kindred, pharmacist at [facility] to inform patient's Cogentin was discontinued on 03/30/15 by Dr. Adele Schilder at the same time he discontinued her Zyprexa.  Requested he have patient call if any problems with this change.

## 2015-04-21 ENCOUNTER — Other Ambulatory Visit: Payer: Self-pay | Admitting: Family Medicine

## 2015-04-23 ENCOUNTER — Encounter: Payer: Self-pay | Admitting: Family Medicine

## 2015-04-23 ENCOUNTER — Ambulatory Visit (INDEPENDENT_AMBULATORY_CARE_PROVIDER_SITE_OTHER): Payer: Medicare Other | Admitting: Family Medicine

## 2015-04-23 VITALS — BP 111/77 | HR 80 | Temp 97.7°F | Ht 69.0 in | Wt 348.4 lb

## 2015-04-23 DIAGNOSIS — J019 Acute sinusitis, unspecified: Secondary | ICD-10-CM | POA: Diagnosis not present

## 2015-04-23 MED ORDER — CETIRIZINE HCL 10 MG PO TABS: 10.0000 mg | ORAL_TABLET | Freq: Every day | ORAL | Status: DC

## 2015-04-23 MED ORDER — FLUTICASONE PROPIONATE 50 MCG/ACT NA SUSP: 1.0000 | Freq: Two times a day (BID) | NASAL | Status: DC | PRN

## 2015-04-23 MED ORDER — AZITHROMYCIN 250 MG PO TABS: ORAL_TABLET | ORAL | Status: DC

## 2015-04-23 NOTE — Patient Instructions (Signed)

## 2015-04-23 NOTE — Progress Notes (Signed)
BP 111/77 mmHg  Pulse 80  Temp(Src) 97.7 F (36.5 C) (Oral)  Ht 5\' 9"  (1.753 m)  Wt 348 lb 6.4 oz (158.033 kg)  BMI 51.43 kg/m2  LMP 02/18/1995   Subjective:    Patient ID: Allison Sharp, female    DOB: 03-Aug-1955, 59 y.o.   MRN: [id number]  HPI: Allison Sharp is a 59 y.o. female presenting on 04/23/2015 for Headache and Sinusitis   HPI Sinus congestion Patient is been having headache and sinus congestion and sinus pressure especially under her eyes for the past week. She's tried over-the-counter Tylenol and a decongestant without much success. She denies any fevers or chills. She does admit she has postnasal drainage causing her to have a little bit of a sore throat. She does admit to having a cough that is producing only clear drainage. She does not know of any sick contacts.  Relevant past medical, surgical, family and social history reviewed and updated as indicated. Interim medical history since our last visit reviewed. Allergies and medications reviewed and updated.  Review of Systems  Constitutional: Negative for fever and chills.  HENT: Positive for congestion, postnasal drip, rhinorrhea, sinus pressure, sneezing and sore throat. Negative for ear discharge and ear pain.   Eyes: Negative for pain, redness and visual disturbance.  Respiratory: Positive for cough. Negative for chest tightness and shortness of breath.   Cardiovascular: Negative for chest pain and leg swelling.  Genitourinary: Negative for dysuria and difficulty urinating.  Musculoskeletal: Negative for back pain and gait problem.  Skin: Negative for rash.  Neurological: Negative for light-headedness and headaches.  Psychiatric/Behavioral: Negative for behavioral problems and agitation.  All other systems reviewed and are negative.   Per HPI unless specifically indicated above     Medication List       This list is accurate as of: 04/23/15 10:31 AM.  Always use your most recent med list.               acetaminophen 500 MG tablet  Commonly known as:  TYLENOL  Take 1,000 mg by mouth every 6 (six) hours as needed for mild pain.     albuterol 108 (90 BASE) MCG/ACT inhaler  Commonly known as:  PROVENTIL HFA;VENTOLIN HFA  Inhale 2 puffs into the lungs every 6 (six) hours as needed for wheezing or shortness of breath.     azithromycin 250 MG tablet  Commonly known as:  ZITHROMAX  Take 2 the first day and then one each day after.     cetirizine 10 MG tablet  Commonly known as:  ZYRTEC HIVES RELIEF  Take 1 tablet (10 mg total) by mouth daily.     cloNIDine 0.1 MG tablet  Commonly known as:  CATAPRES  Take 1 tablet (0.1 mg total) by mouth at bedtime.     Clotrimazole 1 % Oint  Apply 1 application topically 2 (two) times daily as needed (SKIN RASHES).     divalproex 250 MG DR tablet  Commonly known as:  DEPAKOTE  Take 1 tablet (250 mg total) by mouth 2 (two) times daily.     DULoxetine 30 MG capsule  Commonly known as:  CYMBALTA  Take 1 capsule daily for 1 week and than 2 daily     fluticasone 50 MCG/ACT nasal spray  Commonly known as:  FLONASE  Place 1 spray into both nostrils 2 (two) times daily as needed for allergies or rhinitis.     levothyroxine 112 MCG tablet  Commonly known  as:  SYNTHROID, LEVOTHROID  Take 1 tablet (112 mcg total) by mouth daily before breakfast. For hypothyroidism.     lisinopril-hydrochlorothiazide 20-25 MG tablet  Commonly known as:  PRINZIDE,ZESTORETIC  Take 1 tablet by mouth daily.     lurasidone 40 MG Tabs tablet  Commonly known as:  LATUDA  Take 1 tablet (40 mg total) by mouth daily with breakfast.     meloxicam 7.5 MG tablet  Commonly known as:  MOBIC  Take 1 tablet (7.5 mg total) by mouth daily.     multivitamin with minerals Tabs tablet  Take 1 tablet by mouth daily. For nutritional supplementation.     omeprazole 20 MG capsule  Commonly known as:  PRILOSEC  Take 1 capsule (20 mg total) by mouth daily.             Objective:    BP 111/77 mmHg  Pulse 80  Temp(Src) 97.7 F (36.5 C) (Oral)  Ht 5\' 9"  (1.753 m)  Wt 348 lb 6.4 oz (158.033 kg)  BMI 51.43 kg/m2  LMP 02/18/1995  Wt Readings from Last 3 Encounters:  04/23/15 348 lb 6.4 oz (158.033 kg)  03/30/15 352 lb 12.8 oz (160.029 kg)  03/29/15 351 lb (159.213 kg)    Physical Exam  Constitutional: She is oriented to person, place, and time. She appears well-developed and well-nourished. No distress.  HENT:  Right Ear: Tympanic membrane, external ear and ear canal normal.  Left Ear: Tympanic membrane, external ear and ear canal normal.  Nose: Mucosal edema and rhinorrhea present. No epistaxis. Right sinus exhibits maxillary sinus tenderness. Right sinus exhibits no frontal sinus tenderness. Left sinus exhibits maxillary sinus tenderness. Left sinus exhibits no frontal sinus tenderness.  Mouth/Throat: Uvula is midline and mucous membranes are normal. Posterior oropharyngeal edema and posterior oropharyngeal erythema present. No oropharyngeal exudate or tonsillar abscesses.  Eyes: Conjunctivae and EOM are normal. Pupils are equal, round, and reactive to light.  Cardiovascular: Normal rate, regular rhythm, normal heart sounds and intact distal pulses.   No murmur heard. Pulmonary/Chest: Effort normal and breath sounds normal. No respiratory distress. She has no wheezes.  Musculoskeletal: Normal range of motion. She exhibits no edema or tenderness.  Neurological: She is alert and oriented to person, place, and time. Coordination normal.  Skin: Skin is warm and dry. No rash noted. She is not diaphoretic.  Psychiatric: She has a normal mood and affect. Her behavior is normal.  Nursing note and vitals reviewed.   Results for orders placed or performed in visit on 03/30/15  Hemoglobin A1c  Result Value Ref Range   Hgb A1c MFr Bld 5.8 (H) <5.7 %   Mean Plasma Glucose 120 (H) <117 mg/dL      Assessment & Plan:   Problem List Items Addressed This  Visit    None    Visit Diagnoses    Acute rhinosinusitis    -  Primary    Recommended to use Flonase and Zyrtec first and if no better after 3 days then get Z-Pak    Relevant Medications    azithromycin (ZITHROMAX) 250 MG tablet    fluticasone (FLONASE) 50 MCG/ACT nasal spray    cetirizine (ZYRTEC HIVES RELIEF) 10 MG tablet        Follow up plan: Return if symptoms worsen or fail to improve.  Counseling provided for all of the vaccine components No orders of the defined types were placed in this encounter.    [name], MD [facility] 04/23/2015,  10:31 AM

## 2015-04-28 ENCOUNTER — Telehealth (HOSPITAL_COMMUNITY): Payer: Self-pay

## 2015-04-28 NOTE — Telephone Encounter (Signed)
Medication problem - States for the past 5 nights she has not slept much at all.  Reports she is watching the clock at night, not napping during the day and needs something to help sleep.  Patient states she was getting Valium 5mg , one at night when at [facility] and after recent stay in August from Dr. Sabra Heck and requests this back or something to help sleep.  Patient states, "I am my wits end" and requests Dr. Adele Schilder assist her with something to help sleep.  Patient was scheduled for 04/16/15 but was rescheduled to 06/04/15 due to provider being out of town then.  Patient requests refills of medications be sent to her [facility].

## 2015-04-28 NOTE — Telephone Encounter (Signed)
Medication management - Attempted to reach pt to follow up on message left that she is not sleeping, going on 4 days and would like something for sleep.  Pt. also in need fo refills.  Last seen 03/30/15, rescheduled from 04/16/15 to 06/04/15.  No answer at phone number provided.

## 2015-04-28 NOTE — Telephone Encounter (Signed)
Telephone call with patient after discussing with Dr. Adele Schilder to inform he would need to see patient before trying more medication with patient for sleep.  Informed patient she could be seen on 05/04/15 at 2 pm and patient agreed with time but stated she was worried she would end up in the hospital if something was not done before then.  Discussed past medications and sleep hygiene to assist patient with managing issue until 05/04/15 but reiterated Dr. Adele Schilder was not willing to add more medication to her current regimen without evaluating patient first.  Patient will call back as needed and agreed to keep appointment on 05/04/15. Agreed to follow up for emergency care if needed and no SI/HI reported at this time.

## 2015-04-29 ENCOUNTER — Other Ambulatory Visit: Payer: Self-pay | Admitting: *Deleted

## 2015-04-29 MED ORDER — LISINOPRIL-HYDROCHLOROTHIAZIDE 20-25 MG PO TABS: 1.0000 | ORAL_TABLET | Freq: Every day | ORAL | Status: DC

## 2015-04-29 MED ORDER — MELOXICAM 7.5 MG PO TABS: ORAL_TABLET | ORAL | Status: DC

## 2015-05-03 ENCOUNTER — Telehealth (HOSPITAL_COMMUNITY): Payer: Self-pay

## 2015-05-03 DIAGNOSIS — F3160 Bipolar disorder, current episode mixed, unspecified: Secondary | ICD-10-CM

## 2015-05-03 MED ORDER — LURASIDONE HCL 40 MG PO TABS: 40.0000 mg | ORAL_TABLET | Freq: Every day | ORAL | Status: DC

## 2015-05-03 MED ORDER — DIVALPROEX SODIUM 250 MG PO DR TAB: 250.0000 mg | DELAYED_RELEASE_TABLET | Freq: Two times a day (BID) | ORAL | Status: DC

## 2015-05-03 MED ORDER — DULOXETINE HCL 30 MG PO CPEP: ORAL_CAPSULE | ORAL | Status: DC

## 2015-05-03 NOTE — Telephone Encounter (Signed)
Met with Dr. Adele Schilder who approved refills of patient's Cymbalta, Latuda, and Depakote and called patient's [facility] to verify these were the orders needed after faxes received.  [name], pharmacist reported they help patient weekly with doing a medication box and reported they needed these orders to complete her box for the coming week.  Pharmacist agreed to remind patient of scheduled evaluation set for Dr.Arfeen on 05/04/15 and e-scribed in one time refills of patient's Latuda, Cymbalta and Depakote ER as approved by Dr. Adele Schilder for one time refills of each.

## 2015-05-04 ENCOUNTER — Encounter (HOSPITAL_COMMUNITY): Payer: Self-pay | Admitting: Psychiatry

## 2015-05-04 ENCOUNTER — Ambulatory Visit (INDEPENDENT_AMBULATORY_CARE_PROVIDER_SITE_OTHER): Payer: Medicare Other | Admitting: Psychiatry

## 2015-05-04 VITALS — BP 124/88 | HR 99 | Ht 69.0 in | Wt 346.0 lb

## 2015-05-04 DIAGNOSIS — F3160 Bipolar disorder, current episode mixed, unspecified: Secondary | ICD-10-CM | POA: Diagnosis not present

## 2015-05-04 MED ORDER — DULOXETINE HCL 60 MG PO CPEP: 60.0000 mg | ORAL_CAPSULE | Freq: Every day | ORAL | Status: DC

## 2015-05-04 MED ORDER — LURASIDONE HCL 40 MG PO TABS: 40.0000 mg | ORAL_TABLET | Freq: Every day | ORAL | Status: DC

## 2015-05-04 MED ORDER — DIVALPROEX SODIUM 500 MG PO DR TAB: 500.0000 mg | DELAYED_RELEASE_TABLET | Freq: Two times a day (BID) | ORAL | Status: DC

## 2015-05-04 NOTE — Progress Notes (Signed)
[facility] [phone] Progress Note  Allison Sharp [id number] 59 y.o.  05/04/2015 2:30 PM  Chief Complaint:  I was unable to sleep for 4 nights.  Finally I'm feeling better but I still have a lot of irritability anger and mood swing.    History of Present Illness:  Allison Sharp came for her follow-up appointment.  She had called multiple times complaining of increase in irritability poor sleep and restlessness.  She is taking her medication but does not feel it is working very well.  She is to take a higher dose of Depakote.  For past few days she started to feel that medicine finally start working.  She is somewhat calmer and sleeping 4 hours.  However she still have easily agitation, highs and lows, labile mood and racing thoughts.  She endorse nightmares and flashback.  She is happy that her weight is a stable .  She had a good Thanksgiving as she was able to see her sister but she admitted not getting along with other family members.  She has not seen Allison Sharp in a while but promised to see her for coping skills.  She is tolerating her medication.  She denies any side effects including any tremors or shakes.  She insists that she wants to take the medication that helped her mood swings but does not cause weight gain.  She recalled Depakote did not cause weight issue in the past.  She denies any paranoia or any hallucination.  She denies any feeling of hopelessness or worthlessness.  She is taking Cymbalta, Depakote and latuda.  She denies drinking or using any illegal substances.  She lives with her niece and her 64 year old child.  Her appetite is okay.  Her vitals are stable.  She is planning to visit [place] after Christmas for 2 weeks and she is excited about it.  Suicidal Ideation: No Plan Formed: No Patient has means to carry out plan: No  Homicidal Ideation: No Plan Formed: No Patient has means to carry out plan: No  Past Psychiatric History/Hospitalization(s): Patient has history of  psychiatric illness and treatment.  She has at least 4-5 psychiatric hospitalization.  She has history of suicidal attempt in the past by taking overdose on her medication.  She has diagnosed with PTSD, major depressive disorder and bipolar disorder.  In the past she had tried Paxil, Prozac, Wellbutrin, Effexor, Lexapro, amitriptyline, Cymbalta, Neurontin, Depakote, trazodone, Thorazine.  During her last psychiatric hospitalization she was given hydroxyzine, Luvox, Sinequan, Zyprexa .  She was seeing this Probation officer in [place] and then Dr. Gilford Rile.  She has history of psychosis, mania, severe mood swing and depression. Anxiety: Yes Bipolar Disorder: Yes Depression: Yes Mania: Yes Psychosis: Yes Schizophrenia: No Personality Disorder: No Hospitalization for psychiatric illness: Yes History of Electroconvulsive Shock Therapy: No Prior Suicide Attempts: Yes  Medical History; Patient has multiple health problem.  She has hyperlipidemia, GERD, chronic back pain, arthritis, sleep apnea, hypertension, COPD, obesity, thyroid problem, coronary artery disease, headaches and she had history of knee arthroscopy, tonsillectomy, back surgery, hysterectomy, cholecystectomy, hernia repair, joint replacement and appendectomy.  Family History; Patient sister, nieces, son has psychiatric issues.  Her sister and niece has been seen in this office.  Her son has drug problem.  Review of Systems: Psychiatric: Agitation: Irritability Hallucination: No Depressed Mood: Yes Insomnia: Yes Hypersomnia: No Altered Concentration: No Feels Worthless: No Grandiose Ideas: No Belief In Special Powers: No New/Increased Substance Abuse: No Compulsions: No  Neurologic: Headache: No Seizure: No Paresthesias:  No   Outpatient Encounter Prescriptions as of 05/04/2015  Medication Sig  . diclofenac sodium (VOLTAREN) 1 % GEL Apply to affected area twice daily as needed  . acetaminophen (TYLENOL) 500 MG tablet Take 1,000 mg  by mouth every 6 (six) hours as needed for mild pain.  Marland Kitchen albuterol (PROVENTIL HFA;VENTOLIN HFA) 108 (90 BASE) MCG/ACT inhaler Inhale 2 puffs into the lungs every 6 (six) hours as needed for wheezing or shortness of breath.  Marland Kitchen azithromycin (ZITHROMAX) 250 MG tablet Take 2 the first day and then one each day after.  . cetirizine (ZYRTEC HIVES RELIEF) 10 MG tablet Take 1 tablet (10 mg total) by mouth daily.  . cloNIDine (CATAPRES) 0.1 MG tablet Take 1 tablet (0.1 mg total) by mouth at bedtime.  . Clotrimazole 1 % OINT Apply 1 application topically 2 (two) times daily as needed (SKIN RASHES).  Marland Kitchen divalproex (DEPAKOTE) 500 MG DR tablet Take 1 tablet (500 mg total) by mouth 2 (two) times daily.  . DULoxetine (CYMBALTA) 60 MG capsule Take 1 capsule (60 mg total) by mouth daily.  . fluticasone (FLONASE) 50 MCG/ACT nasal spray Place 1 spray into both nostrils 2 (two) times daily as needed for allergies or rhinitis.  Marland Kitchen levothyroxine (SYNTHROID, LEVOTHROID) 112 MCG tablet Take 1 tablet (112 mcg total) by mouth daily before breakfast. For hypothyroidism.  Marland Kitchen lisinopril-hydrochlorothiazide (PRINZIDE,ZESTORETIC) 20-25 MG tablet Take 1 tablet by mouth daily.  Marland Kitchen lurasidone (LATUDA) 40 MG TABS tablet Take 1 tablet (40 mg total) by mouth daily with breakfast.  . meloxicam (MOBIC) 7.5 MG tablet Take 1 tablet (7.5 mg total) by mouth daily.  . Multiple Vitamin (MULTIVITAMIN WITH MINERALS) TABS Take 1 tablet by mouth daily. For nutritional supplementation.  Marland Kitchen omeprazole (PRILOSEC) 20 MG capsule Take 1 capsule (20 mg total) by mouth daily.  . [DISCONTINUED] divalproex (DEPAKOTE) 250 MG DR tablet Take 1 tablet (250 mg total) by mouth 2 (two) times daily.  . [DISCONTINUED] DULoxetine (CYMBALTA) 30 MG capsule Take 2 capsules (60 mg total) by mouth daily  . [DISCONTINUED] lurasidone (LATUDA) 40 MG TABS tablet Take 1 tablet (40 mg total) by mouth daily with breakfast.   No facility-administered encounter medications on file as  of 05/04/2015.    Recent Results (from the past 2160 hour(s))  CBC with Differential     Status: None   Collection Time: 02/27/15  1:26 PM  Result Value Ref Range   WBC 6.8 4.0 - 10.5 K/uL   RBC 4.68 3.87 - 5.11 MIL/uL   Hemoglobin 14.5 12.0 - 15.0 g/dL   HCT 42.0 36.0 - 46.0 %   MCV 89.7 78.0 - 100.0 fL   MCH 31.0 26.0 - 34.0 pg   MCHC 34.5 30.0 - 36.0 g/dL   RDW 13.1 11.5 - 15.5 %   Platelets 226 150 - 400 K/uL   Neutrophils Relative % 54 %   Neutro Abs 3.8 1.7 - 7.7 K/uL   Lymphocytes Relative 37 %   Lymphs Abs 2.5 0.7 - 4.0 K/uL   Monocytes Relative 6 %   Monocytes Absolute 0.4 0.1 - 1.0 K/uL   Eosinophils Relative 2 %   Eosinophils Absolute 0.1 0.0 - 0.7 K/uL   Basophils Relative 1 %   Basophils Absolute 0.0 0.0 - 0.1 K/uL  Basic metabolic panel     Status: Abnormal   Collection Time: 02/27/15  1:26 PM  Result Value Ref Range   Sodium 141 135 - 145 mmol/L   Potassium 3.8 3.5 -  5.1 mmol/L   Chloride 106 101 - 111 mmol/L   CO2 28 22 - 32 mmol/L   Glucose, Bld 118 (H) 65 - 99 mg/dL   BUN 12 6 - 20 mg/dL   Creatinine, Ser 0.69 0.44 - 1.00 mg/dL   Calcium 8.9 8.9 - 10.3 mg/dL   GFR calc non Af Amer >60 >60 mL/min   GFR calc Af Amer >60 >60 mL/min    Comment: (NOTE) The eGFR has been calculated using the CKD EPI equation. This calculation has not been validated in all clinical situations. eGFR's persistently <60 mL/min signify possible Chronic Kidney Disease.    Anion gap 7 5 - 15  Troponin I     Status: None   Collection Time: 02/27/15  1:26 PM  Result Value Ref Range   Troponin I <0.03 <0.031 ng/mL    Comment:        NO INDICATION OF MYOCARDIAL INJURY.   Sedimentation rate     Status: None   Collection Time: 03/01/15  2:49 PM  Result Value Ref Range   Sed Rate 2 0 - 40 mm/hr  Lipase     Status: None   Collection Time: 03/05/15  2:53 PM  Result Value Ref Range   Lipase 32 0 - 59 U/L  CMP14+EGFR     Status: None   Collection Time: 03/05/15  2:53 PM   Result Value Ref Range   Glucose 92 65 - 99 mg/dL   BUN 14 8 - 27 mg/dL   Creatinine, Ser 0.74 0.57 - 1.00 mg/dL   GFR calc non Af Amer 88 >59 mL/min/1.73   GFR calc Af Amer 101 >59 mL/min/1.73   BUN/Creatinine Ratio 19 11 - 26   Sodium 139 134 - 144 mmol/L    Comment: **Effective March 15, 2015 the reference interval**   for Sodium, Serum will be changing to:                                             136 - 144    Potassium 4.3 3.5 - 5.2 mmol/L    Comment: **Effective March 15, 2015 the reference interval**   for Potassium, Serum will be changing to:                          0 -  7 days        3.7 - 5.2                          8 - 30 days        3.7 - 6.4                          1 -  6 months      3.8 - 6.0                   7 months -  1 year        3.8 - 5.3                              >1 year        3.5 - 5.2    Chloride 97 97 - 108 mmol/L  Comment: **Effective March 15, 2015 the reference interval**   for Chloride, Serum will be changing to:                                              97 - 106    CO2 26 18 - 29 mmol/L   Calcium 9.9 8.7 - 10.3 mg/dL   Total Protein 7.1 6.0 - 8.5 g/dL   Albumin 4.4 3.6 - 4.8 g/dL   Globulin, Total 2.7 1.5 - 4.5 g/dL   Albumin/Globulin Ratio 1.6 1.1 - 2.5   Bilirubin Total 0.4 0.0 - 1.2 mg/dL   Alkaline Phosphatase 98 39 - 117 IU/L   AST 14 0 - 40 IU/L   ALT 18 0 - 32 IU/L  Bilirubin, fractionated(tot/dir/indir)     Status: None   Collection Time: 03/05/15  2:53 PM  Result Value Ref Range   Bilirubin, Direct 0.11 0.00 - 0.40 mg/dL   Bilirubin, Indirect 0.29 0.10 - 0.80 mg/dL  CBC with Differential/Platelet     Status: None   Collection Time: 03/05/15  2:53 PM  Result Value Ref Range   WBC 8.1 3.4 - 10.8 x10E3/uL   RBC 4.73 3.77 - 5.28 x10E6/uL   Hemoglobin 14.6 11.1 - 15.9 g/dL   Hematocrit 42.8 34.0 - 46.6 %   MCV 91 79 - 97 fL   MCH 30.9 26.6 - 33.0 pg   MCHC 34.1 31.5 - 35.7 g/dL   RDW 14.2 12.3 - 15.4 %    Platelets 246 150 - 379 x10E3/uL   Neutrophils 53 %   Lymphs 37 %   Monocytes 7 %   Eos 2 %   Basos 1 %   Neutrophils Absolute 4.3 1.4 - 7.0 x10E3/uL   Lymphocytes Absolute 3.0 0.7 - 3.1 x10E3/uL   Monocytes Absolute 0.6 0.1 - 0.9 x10E3/uL   EOS (ABSOLUTE) 0.2 0.0 - 0.4 x10E3/uL   Basophils Absolute 0.0 0.0 - 0.2 x10E3/uL   Immature Granulocytes 0 %   Immature Grans (Abs) 0.0 0.0 - 0.1 B51W2/HE  Basic metabolic panel     Status: Abnormal   Collection Time: 03/29/15  4:19 PM  Result Value Ref Range   Sodium 136 135 - 146 mmol/L   Potassium 3.7 3.5 - 5.3 mmol/L   Chloride 96 (L) 98 - 110 mmol/L   CO2 28 20 - 31 mmol/L   Glucose, Bld 99 65 - 99 mg/dL   BUN 14 7 - 25 mg/dL   Creat 0.95 0.50 - 0.99 mg/dL   Calcium 9.9 8.6 - 10.4 mg/dL  Hemoglobin A1c     Status: Abnormal   Collection Time: 03/30/15  3:16 PM  Result Value Ref Range   Hgb A1c MFr Bld 5.8 (H) <5.7 %    Comment:                                                                        According to the ADA Clinical Practice Recommendations for 2011, when HbA1c is used as a screening test:     >=6.5%   Diagnostic of Diabetes Mellitus            (  if abnormal result is confirmed)   5.7-6.4%   Increased risk of developing Diabetes Mellitus   References:Diagnosis and Classification of Diabetes Mellitus,Diabetes UPJS,3159,45(OPFYT 1):S62-S69 and Standards of Medical Care in         Diabetes - 2011,Diabetes [id number],2863,81 (Suppl 1):S11-S61.      Mean Plasma Glucose 120 (H) <117 mg/dL      Constitutional:  BP 124/88 mmHg  Pulse 99  Ht '5\' 9"'  (1.753 m)  Wt 346 lb (156.945 kg)  BMI 51.07 kg/m2  LMP 02/18/1995   Musculoskeletal: Strength & Muscle Tone: decreased Gait & Station: unsteady Patient leans: Backward  Psychiatric Specialty Exam: General Appearance: Casual  Eye Contact::  Fair  Speech:  Slow  Volume:  Normal  Mood:  Irritable  Affect:  Constricted and Labile  Thought Process:  Circumstantial   Orientation:  Full (Time, Place, and Person)  Thought Content:  Rumination  Suicidal Thoughts:  No  Homicidal Thoughts:  No  Memory:  Immediate;   Fair Recent;   Fair Remote;   Fair  Judgement:  Intact  Insight:  Fair  Psychomotor Activity:  Increased  Concentration:  Fair  Recall:  [employer] of Knowledge:  Fair  Language:  Fair  Akathisia:  No  Handed:  Right  AIMS (if indicated):     Assets:  Communication Skills Desire for Improvement Housing  ADL's:  Intact  Cognition:  Impaired,  Mild  Sleep:        Established Problem, Stable/Improving (1), Review of Psycho-Social Stressors (1), Review or order clinical lab tests (1), Review and summation of old records (2), Established Problem, Worsening (2), Review of Last Therapy Session (1), Review of Medication Regimen & Side Effects (2) and Review of New Medication or Change in Dosage (2)  Assessment: Axis I: Bipolar disorder mixed.  Rule out schizoaffective disorder depressed type, PTSD, rule out major depressive disorder recurrent  Axis II: Deferred  Axis III:  Past Medical History  Diagnosis Date  . Hyperlipidemia   . CAD (coronary artery disease)   . Bipolar 1 disorder (Minor)   . GERD (gastroesophageal reflux disease)   . Hyperthyroidism   . Chronic back pain   . Dyspnea     PFT 03/05/09 FEV1 2.77(98%), FVC 3.25(86%), FEV1% 85, TLC 5.88(99%), DLCO 60% ,  Methacholine challenge 03/16/09 normal ,  CT chest 03/12/09 no pulmonary disease  . Anxiety   . Arthritis   . Depression   . OSA on CPAP     2 liters  . HTN (hypertension)   . History of colonoscopy 10/17/2002    by Dr Rehman-> distal non-specific proctitis, small ext hemorrhoids,   . Fungal infection   . Cellulitis   . Contusion of sacrum   . Migraine headache   . Vitamin D deficiency   . Sleep apnea   . Myocardial infarction ([place])     NOV 1997  . Hypothyroidism     States she only has hyperthyroidism  . Complication of anesthesia     States she  typically gets sick s/p anesthesia  . Morbid obesity with body mass index of 50.0-59.9 in adult [facility]) JAN 2011 370 LBS    2004 311 BMI 45.9  . PTSD (post-traumatic stress disorder)   . Asthma   . Allergy   . Urine incontinence   . Chronic headaches   . Suicidal ideation   . COPD (chronic obstructive pulmonary disease) (HCC)      Plan:  I discussed medication side effects and  benefits.  She used to take a higher dose of Depakote.  I recommended to increase Depakote 500 mg twice a day and if still have irritability and anger then she may be taking 1500 mg a day.  Continue Cymbalta 60 mg daily and Latuda 40 mg daily.  I encouraged to see therapist for coping and social skills.  Patient promised that she will see therapist once she returned from [place].  She has no tremors shakes or any EPS.  We will consider hemoglobin A1c and Depakote level on her next appointment. Discuss safety plan that anytime having active suicidal thoughts or homicidal thoughts and she need to call 911 or go to local emergency room.  I will see her again in 4 weeks.  Recommended to call us back if she has any question or any concern.  Ayana Imhof T., MD 05/04/2015

## 2015-05-14 ENCOUNTER — Ambulatory Visit (HOSPITAL_BASED_OUTPATIENT_CLINIC_OR_DEPARTMENT_OTHER): Payer: Medicare Other | Attending: Internal Medicine

## 2015-05-14 ENCOUNTER — Ambulatory Visit: Payer: Medicare Other | Admitting: Pediatrics

## 2015-05-15 ENCOUNTER — Other Ambulatory Visit: Payer: Self-pay | Admitting: *Deleted

## 2015-05-15 MED ORDER — DICLOFENAC SODIUM 1 % TD GEL: TRANSDERMAL | Status: DC

## 2015-05-17 ENCOUNTER — Encounter: Payer: Self-pay | Admitting: Family Medicine

## 2015-05-23 ENCOUNTER — Emergency Department (HOSPITAL_COMMUNITY)
Admission: EM | Admit: 2015-05-23 | Discharge: 2015-05-23 | Disposition: A | Discharge status: Home / Self Care | Payer: Medicare Other | Attending: Emergency Medicine | Admitting: Emergency Medicine

## 2015-05-23 ENCOUNTER — Emergency Department (HOSPITAL_COMMUNITY): Payer: Medicare Other

## 2015-05-23 ENCOUNTER — Encounter (HOSPITAL_COMMUNITY): Payer: Self-pay | Admitting: Emergency Medicine

## 2015-05-23 DIAGNOSIS — E0590 Thyrotoxicosis, unspecified without thyrotoxic crisis or storm: Secondary | ICD-10-CM | POA: Insufficient documentation

## 2015-05-23 DIAGNOSIS — I252 Old myocardial infarction: Secondary | ICD-10-CM | POA: Insufficient documentation

## 2015-05-23 DIAGNOSIS — F319 Bipolar disorder, unspecified: Secondary | ICD-10-CM | POA: Insufficient documentation

## 2015-05-23 DIAGNOSIS — Z872 Personal history of diseases of the skin and subcutaneous tissue: Secondary | ICD-10-CM | POA: Insufficient documentation

## 2015-05-23 DIAGNOSIS — J069 Acute upper respiratory infection, unspecified: Secondary | ICD-10-CM | POA: Insufficient documentation

## 2015-05-23 DIAGNOSIS — R05 Cough: Secondary | ICD-10-CM | POA: Diagnosis not present

## 2015-05-23 DIAGNOSIS — G43909 Migraine, unspecified, not intractable, without status migrainosus: Secondary | ICD-10-CM | POA: Insufficient documentation

## 2015-05-23 DIAGNOSIS — Z792 Long term (current) use of antibiotics: Secondary | ICD-10-CM | POA: Insufficient documentation

## 2015-05-23 DIAGNOSIS — R0602 Shortness of breath: Secondary | ICD-10-CM | POA: Diagnosis not present

## 2015-05-23 DIAGNOSIS — Z79899 Other long term (current) drug therapy: Secondary | ICD-10-CM | POA: Diagnosis not present

## 2015-05-23 DIAGNOSIS — K219 Gastro-esophageal reflux disease without esophagitis: Secondary | ICD-10-CM | POA: Insufficient documentation

## 2015-05-23 DIAGNOSIS — Z8619 Personal history of other infectious and parasitic diseases: Secondary | ICD-10-CM | POA: Insufficient documentation

## 2015-05-23 DIAGNOSIS — G8929 Other chronic pain: Secondary | ICD-10-CM | POA: Diagnosis not present

## 2015-05-23 DIAGNOSIS — F4310 Post-traumatic stress disorder, unspecified: Secondary | ICD-10-CM | POA: Insufficient documentation

## 2015-05-23 DIAGNOSIS — J441 Chronic obstructive pulmonary disease with (acute) exacerbation: Secondary | ICD-10-CM | POA: Diagnosis not present

## 2015-05-23 DIAGNOSIS — R059 Cough, unspecified: Secondary | ICD-10-CM

## 2015-05-23 DIAGNOSIS — I509 Heart failure, unspecified: Secondary | ICD-10-CM | POA: Diagnosis not present

## 2015-05-23 DIAGNOSIS — J40 Bronchitis, not specified as acute or chronic: Secondary | ICD-10-CM | POA: Diagnosis not present

## 2015-05-23 DIAGNOSIS — J9801 Acute bronchospasm: Secondary | ICD-10-CM | POA: Diagnosis not present

## 2015-05-23 DIAGNOSIS — Z6841 Body Mass Index (BMI) 40.0 and over, adult: Secondary | ICD-10-CM | POA: Diagnosis not present

## 2015-05-23 DIAGNOSIS — Z87828 Personal history of other (healed) physical injury and trauma: Secondary | ICD-10-CM | POA: Insufficient documentation

## 2015-05-23 DIAGNOSIS — M1990 Unspecified osteoarthritis, unspecified site: Secondary | ICD-10-CM | POA: Diagnosis not present

## 2015-05-23 DIAGNOSIS — G4730 Sleep apnea, unspecified: Secondary | ICD-10-CM | POA: Insufficient documentation

## 2015-05-23 DIAGNOSIS — F419 Anxiety disorder, unspecified: Secondary | ICD-10-CM | POA: Insufficient documentation

## 2015-05-23 DIAGNOSIS — Z9889 Other specified postprocedural states: Secondary | ICD-10-CM | POA: Diagnosis not present

## 2015-05-23 DIAGNOSIS — Z791 Long term (current) use of non-steroidal anti-inflammatories (NSAID): Secondary | ICD-10-CM | POA: Insufficient documentation

## 2015-05-23 DIAGNOSIS — I2510 Atherosclerotic heart disease of native coronary artery without angina pectoris: Secondary | ICD-10-CM | POA: Diagnosis not present

## 2015-05-23 DIAGNOSIS — J209 Acute bronchitis, unspecified: Secondary | ICD-10-CM

## 2015-05-23 DIAGNOSIS — Z9981 Dependence on supplemental oxygen: Secondary | ICD-10-CM | POA: Insufficient documentation

## 2015-05-23 HISTORY — DX: Heart failure, unspecified: I50.9

## 2015-05-23 LAB — CBC WITH DIFFERENTIAL/PLATELET
BASOS ABS: 0 10*3/uL (ref 0.0–0.1)
Basophils Relative: 1 %
EOS PCT: 3 %
Eosinophils Absolute: 0.2 10*3/uL (ref 0.0–0.7)
HEMATOCRIT: 42.4 % (ref 36.0–46.0)
Hemoglobin: 14.6 g/dL (ref 12.0–15.0)
LYMPHS ABS: 2.7 10*3/uL (ref 0.7–4.0)
LYMPHS PCT: 42 %
MCH: 30.7 pg (ref 26.0–34.0)
MCHC: 34.4 g/dL (ref 30.0–36.0)
MCV: 89.1 fL (ref 78.0–100.0)
MONO ABS: 0.9 10*3/uL (ref 0.1–1.0)
Monocytes Relative: 14 %
NEUTROS ABS: 2.6 10*3/uL (ref 1.7–7.7)
Neutrophils Relative %: 40 %
Platelets: 117 10*3/uL — ABNORMAL LOW (ref 150–400)
RBC: 4.76 MIL/uL (ref 3.87–5.11)
RDW: 12.8 % (ref 11.5–15.5)
WBC: 6.5 10*3/uL (ref 4.0–10.5)

## 2015-05-23 LAB — BASIC METABOLIC PANEL
ANION GAP: 7 (ref 5–15)
BUN: 15 mg/dL (ref 6–20)
CHLORIDE: 100 mmol/L — AB (ref 101–111)
CO2: 30 mmol/L (ref 22–32)
Calcium: 9.2 mg/dL (ref 8.9–10.3)
Creatinine, Ser: 0.92 mg/dL (ref 0.44–1.00)
GFR calc non Af Amer: >60 mL/min (ref >60)
GLUCOSE: 105 mg/dL — AB (ref 65–99)
Potassium: 3.7 mmol/L (ref 3.5–5.1)
Sodium: 137 mmol/L (ref 135–145)

## 2015-05-23 LAB — RAPID STREP SCREEN (MED CTR MEBANE ONLY): Streptococcus, Group A Screen (Direct): NEGATIVE

## 2015-05-23 LAB — BRAIN NATRIURETIC PEPTIDE: B Natriuretic Peptide: 20 pg/mL (ref 0.0–100.0)

## 2015-05-23 LAB — TROPONIN I

## 2015-05-23 MED ORDER — ALBUTEROL SULFATE HFA 108 (90 BASE) MCG/ACT IN AERS: 2.0000 | INHALATION_SPRAY | RESPIRATORY_TRACT | Status: DC | PRN

## 2015-05-23 MED ORDER — ALBUTEROL SULFATE (2.5 MG/3ML) 0.083% IN NEBU
2.5000 mg | INHALATION_SOLUTION | Freq: Once | RESPIRATORY_TRACT | Status: AC
  Administered 2015-05-23: 2.5 mg via RESPIRATORY_TRACT
  Filled 2015-05-23: qty 3

## 2015-05-23 MED ORDER — PREDNISONE 50 MG PO TABS
60.0000 mg | ORAL_TABLET | Freq: Once | ORAL | Status: AC
  Administered 2015-05-23: 60 mg via ORAL
  Filled 2015-05-23: qty 1

## 2015-05-23 MED ORDER — IPRATROPIUM-ALBUTEROL 0.5-2.5 (3) MG/3ML IN SOLN
3.0000 mL | Freq: Once | RESPIRATORY_TRACT | Status: AC
  Administered 2015-05-23: 3 mL via RESPIRATORY_TRACT
  Filled 2015-05-23: qty 3

## 2015-05-23 MED ORDER — ALBUTEROL SULFATE (2.5 MG/3ML) 0.083% IN NEBU: 2.5000 mg | INHALATION_SOLUTION | RESPIRATORY_TRACT | Status: DC | PRN

## 2015-05-23 MED ORDER — PREDNISONE 20 MG PO TABS: 40.0000 mg | ORAL_TABLET | Freq: Every day | ORAL | Status: DC

## 2015-05-23 MED ORDER — BENZONATATE 100 MG PO CAPS: 100.0000 mg | ORAL_CAPSULE | Freq: Three times a day (TID) | ORAL | Status: DC | PRN

## 2015-05-23 NOTE — ED Notes (Signed)
Patient with no complaints at this time. Respirations even and unlabored. Skin warm/dry. Discharge instructions reviewed with patient at this time. Patient given opportunity to voice concerns/ask questions. Patient discharged at this time and left Emergency Department with steady gait.   

## 2015-05-23 NOTE — ED Notes (Addendum)
Pt reports persistent cough, SOB, and hoarseness over the past month. States she sometimes coughs up green sputum. Pt also reports vomiting 2x this morning. Pt diagnosed with bronchitis 1 month ago. Pt states she was only given a "partial z-pack" and symptoms have not improved.

## 2015-05-23 NOTE — ED Provider Notes (Signed)
CSN: [id number]     Arrival date & time 05/23/15  1409 History   First MD Initiated Contact with Patient 05/23/15 1422     Chief Complaint  Patient presents with  . Cough      HPI Pt was seen at 1420.  Per pt, c/o gradual onset and persistence of constant sore throat, runny/stuffy nose, sinus congestion, and cough for the past 1 month. Has been associated with constant generalized chest "soreness," which worsens with coughing. Pt states she was evaluated by her PMD for same, dx bronchitis, rx z-pack without improvement. States multiple others in household have similar symptoms. Denies fevers, no rash, no palpitations, no N/V/D, no abd pain.     Past Medical History  Diagnosis Date  . Hyperlipidemia   . CAD (coronary artery disease)   . Bipolar 1 disorder ([place])   . GERD (gastroesophageal reflux disease)   . Hyperthyroidism   . Chronic back pain   . Dyspnea     PFT 03/05/09 FEV1 2.77(98%), FVC 3.25(86%), FEV1% 85, TLC 5.88(99%), DLCO 60% ,  Methacholine challenge 03/16/09 normal ,  CT chest 03/12/09 no pulmonary disease  . Anxiety   . Arthritis   . Depression   . OSA on CPAP     2 liters  . HTN (hypertension)   . History of colonoscopy 10/17/2002    by Dr Rehman-> distal non-specific proctitis, small ext hemorrhoids,   . Fungal infection   . Cellulitis   . Contusion of sacrum   . Migraine headache   . Vitamin D deficiency   . Sleep apnea   . Myocardial infarction ([place])     NOV 1997  . Hypothyroidism     States she only has hyperthyroidism  . Complication of anesthesia     States she typically gets sick s/p anesthesia  . Morbid obesity with body mass index of 50.0-59.9 in adult [facility]) JAN 2011 370 LBS    2004 311 BMI 45.9  . PTSD (post-traumatic stress disorder)   . Asthma   . Allergy   . Urine incontinence   . Chronic headaches   . Suicidal ideation   . COPD (chronic obstructive pulmonary disease) ([place])   . CHF (congestive heart failure) ([facility])     diastolic  dysfunction   Past Surgical History  Procedure Laterality Date  . Cholecystectomy    . Knee arthroscopy    . Appendectomy    . Tonsillectomy    . Back surgery  2008  . Total vaginal hysterectomy    . Abdominal hysterectomy      sept 1996  . Tubal ligation    . Colonoscopy  10/17/2002     Distal proctitis, small external hemorrhoids, otherwise/  normal colonoscopy. Suspect rectal bleeding secondary to hemorrhoids  . Esophagogastroduodenoscopy  03/18/09    fundic gland polyps/mild gastritis  . Cardiac catheterization      nov 1997  . Joint replacement      bil knee replacement  . Hernia repair  1978   Family History  Problem Relation Age of Onset  . Cancer Mother   . Hypertension Mother   . Bipolar disorder Mother   . Dementia Mother   . Depression Mother   . Coronary artery disease Father   . Alcohol abuse Father   . Hypertension Brother   . Coronary artery disease Brother   . Bipolar disorder Brother   . Depression Brother   . Anesthesia problems Neg Hx   . Hypotension Neg Hx   .  Malignant hyperthermia Neg Hx   . Pseudochol deficiency Neg Hx   . Depression Sister   . Paranoid behavior Sister   . Bipolar disorder Sister   . Depression Sister   . Hypertension Sister   . Cancer Son     thyroid   Social History  Substance Use Topics  . Smoking status: Never Smoker   . Smokeless tobacco: Never Used  . Alcohol Use: No    Review of Systems ROS: Statement: All systems negative except as marked or noted in the HPI; Constitutional: Negative for fever and chills. ; ; Eyes: Negative for eye pain, redness and discharge. ; ; ENMT: Negative for ear pain, hoarseness, +nasal congestion, sinus pressure and sore throat. ; ; Cardiovascular: Negative for palpitations, diaphoresis, and peripheral edema. ; ; Respiratory: +cough, SOB. Negative for wheezing and stridor. ; ; Gastrointestinal: Negative for nausea, vomiting, diarrhea, abdominal pain, blood in stool, hematemesis, jaundice  and rectal bleeding. . ; ; Genitourinary: Negative for dysuria, flank pain and hematuria. ; ; Musculoskeletal: +chest wall pain. Negative for back pain and neck pain. Negative for swelling and trauma.; ; Skin: Negative for pruritus, rash, abrasions, blisters, bruising and skin lesion.; ; Neuro: Negative for headache, lightheadedness and neck stiffness. Negative for weakness, altered level of consciousness , altered mental status, extremity weakness, paresthesias, involuntary movement, seizure and syncope.      Allergies  Haldol; Ativan; and Naproxen  Home Medications   Prior to Admission medications   Medication Sig Start Date End Date Taking? Authorizing Provider  acetaminophen (TYLENOL) 500 MG tablet Take 1,000 mg by mouth every 6 (six) hours as needed for mild pain.    Historical Provider, MD  albuterol (PROVENTIL HFA;VENTOLIN HFA) 108 (90 BASE) MCG/ACT inhaler Inhale 2 puffs into the lungs every 6 (six) hours as needed for wheezing or shortness of breath.    Historical Provider, MD  azithromycin (ZITHROMAX) 250 MG tablet Take 2 the first day and then one each day after. 04/23/15   Fransisca Kaufmann Dettinger, MD  cetirizine (ZYRTEC HIVES RELIEF) 10 MG tablet Take 1 tablet (10 mg total) by mouth daily. 04/23/15   Fransisca Kaufmann Dettinger, MD  cloNIDine (CATAPRES) 0.1 MG tablet Take 1 tablet (0.1 mg total) by mouth at bedtime. 04/05/15   Fransisca Kaufmann Dettinger, MD  Clotrimazole 1 % OINT Apply 1 application topically 2 (two) times daily as needed (SKIN RASHES).    Historical Provider, MD  diclofenac sodium (VOLTAREN) 1 % GEL Apply to affected area twice daily as needed 05/15/15   Fransisca Kaufmann Dettinger, MD  divalproex (DEPAKOTE) 500 MG DR tablet Take 1 tablet (500 mg total) by mouth 2 (two) times daily. 05/04/15 05/03/16  Kathlee Nations, MD  DULoxetine (CYMBALTA) 60 MG capsule Take 1 capsule (60 mg total) by mouth daily. 05/04/15   Kathlee Nations, MD  fluticasone (FLONASE) 50 MCG/ACT nasal spray Place 1 spray into both  nostrils 2 (two) times daily as needed for allergies or rhinitis. 04/23/15   Fransisca Kaufmann Dettinger, MD  levothyroxine (SYNTHROID, LEVOTHROID) 112 MCG tablet Take 1 tablet (112 mcg total) by mouth daily before breakfast. For hypothyroidism. 03/01/15 02/29/16  Fransisca Kaufmann Dettinger, MD  lisinopril-hydrochlorothiazide (PRINZIDE,ZESTORETIC) 20-25 MG tablet Take 1 tablet by mouth daily. 04/29/15   Fransisca Kaufmann Dettinger, MD  lurasidone (LATUDA) 40 MG TABS tablet Take 1 tablet (40 mg total) by mouth daily with breakfast. 05/04/15   Kathlee Nations, MD  meloxicam (MOBIC) 7.5 MG tablet Take 1 tablet (7.5 mg  total) by mouth daily. 04/29/15   Fransisca Kaufmann Dettinger, MD  Multiple Vitamin (MULTIVITAMIN WITH MINERALS) TABS Take 1 tablet by mouth daily. For nutritional supplementation. 10/31/12   Niel Hummer, NP  omeprazole (PRILOSEC) 20 MG capsule Take 1 capsule (20 mg total) by mouth daily. 03/01/15   Fransisca Kaufmann Dettinger, MD   BP 125/56 mmHg  Pulse 82  Temp(Src) 97.9 F (36.6 C) (Oral)  Resp 18  SpO2 94%  LMP 02/18/1995 Physical Exam  1425: Physical examination:  Nursing notes reviewed; Vital signs and O2 SAT reviewed;  Constitutional: Well developed, Well nourished, Well hydrated, In no acute distress; Head:  Normocephalic, atraumatic; Eyes: EOMI, PERRL, No scleral icterus; ENMT: TM's clear bilat. +edemetous nasal turbinates bilat with clear rhinorrhea. Mouth and pharynx without lesions. No tonsillar exudates. No intra-oral edema. No submandibular or sublingual edema. No hoarse voice, no drooling, no stridor. No pain with manipulation of larynx. No trismus. Mouth and pharynx normal, Mucous membranes moist; Neck: Supple, Full range of motion, No lymphadenopathy; Cardiovascular: Regular rate and rhythm, No gallop; Respiratory: Breath sounds coarse & equal bilaterally, No wheezes. +non-productive cough during exam. Speaking full sentences with ease, Normal respiratory effort/excursion; Chest: Nontender, Movement normal; Abdomen: Soft,  Nontender, Nondistended, Normal bowel sounds; Genitourinary: No CVA tenderness; Extremities: Pulses normal, No tenderness, No edema, No calf edema or asymmetry.; Neuro: AA&Ox3, Major CN grossly intact.  Speech clear. No gross focal motor or sensory deficits in extremities.; Skin: Color normal, Warm, Dry.   ED Course  Procedures (including critical care time) Labs Review   Imaging Review  I have personally reviewed and evaluated these images and lab results as part of my medical decision-making.   EKG Interpretation None      MDM  MDM Reviewed: previous chart, nursing note and vitals Reviewed previous: labs and ECG Interpretation: labs, ECG and x-ray   Results for orders placed or performed during the hospital encounter of 05/23/15  Rapid strep screen  Result Value Ref Range   Streptococcus, Group A Screen (Direct) NEGATIVE NEGATIVE  Troponin I  Result Value Ref Range   Troponin I <0.03 <0.031 ng/mL  CBC with Differential  Result Value Ref Range   WBC 6.5 4.0 - 10.5 K/uL   RBC 4.76 3.87 - 5.11 MIL/uL   Hemoglobin 14.6 12.0 - 15.0 g/dL   HCT 42.4 36.0 - 46.0 %   MCV 89.1 78.0 - 100.0 fL   MCH 30.7 26.0 - 34.0 pg   MCHC 34.4 30.0 - 36.0 g/dL   RDW 12.8 11.5 - 15.5 %   Platelets 117 (L) 150 - 400 K/uL   Neutrophils Relative % 40 %   Neutro Abs 2.6 1.7 - 7.7 K/uL   Lymphocytes Relative 42 %   Lymphs Abs 2.7 0.7 - 4.0 K/uL   Monocytes Relative 14 %   Monocytes Absolute 0.9 0.1 - 1.0 K/uL   Eosinophils Relative 3 %   Eosinophils Absolute 0.2 0.0 - 0.7 K/uL   Basophils Relative 1 %   Basophils Absolute 0.0 0.0 - 0.1 K/uL  Brain natriuretic peptide  Result Value Ref Range   B Natriuretic Peptide 20.0 0.0 - 100.0 pg/mL  Basic metabolic panel  Result Value Ref Range   Sodium 137 135 - 145 mmol/L   Potassium 3.7 3.5 - 5.1 mmol/L   Chloride 100 (L) 101 - 111 mmol/L   CO2 30 22 - 32 mmol/L   Glucose, Bld 105 (H) 65 - 99 mg/dL   BUN 15 6 -  20 mg/dL   Creatinine, Ser  0.92 0.44 - 1.00 mg/dL   Calcium 9.2 8.9 - 10.3 mg/dL   GFR calc non Af Amer >60 >60 mL/min   GFR calc Af Amer >60 >60 mL/min   Anion gap 7 5 - 15   Dg Chest 2 View 05/23/2015  CLINICAL DATA:  Cough, shortness of breath and hoarseness for a month. Initial encounter. EXAM: CHEST  2 VIEW COMPARISON:  CT chest 03/04/2015.  PA and lateral chest 02/27/2015. FINDINGS: The lungs are clear. Heart size is normal. No pneumothorax or pleural effusion. No focal bony abnormality. IMPRESSION: No acute disease. Electronically Signed   By: Inge Rise M.D.   On: 05/23/2015 15:05     1430:  Pt states she came to the ED today because she "is tired of coughing" and "have a trip to [place] on Tuesday and want to be all better." Expectations set by myself and ED RN. Pt verb understanding.   1715:  Feels better after neb and prednisone and wants to go home now. Has tol PO well without N/V. Sats 97% R/A, resps easy, lungs CTA bilat. Tx symptomatically at this time. Dx and testing d/w pt and family.  Questions answered.  Verb understanding, agreeable to d/c home with outpt f/u.   Francine Graven, DO 05/27/15 2007

## 2015-05-23 NOTE — Discharge Instructions (Signed)
°Emergency Department Resource Guide °1) Find a Doctor and Pay Out of Pocket °Although you won't have to find out who is covered by your insurance plan, it is a good idea to ask around and get recommendations. You will then need to call the office and see if the doctor you have chosen will accept you as a new patient and what types of options they offer for patients who are self-pay. Some doctors offer discounts or will set up payment plans for their patients who do not have insurance, but you will need to ask so you aren't surprised when you get to your appointment. ° °2) Contact Your Local Health Department °Not all health departments have doctors that can see patients for sick visits, but many do, so it is worth a call to see if yours does. If you don't know where your local health department is, you can check in your phone book. The CDC also has a tool to help you locate your state's health department, and many state websites also have listings of all of their local health departments. ° °3) Find a Walk-in Clinic °If your illness is not likely to be very severe or complicated, you may want to try a walk in clinic. These are popping up all over the country in pharmacies, drugstores, and shopping centers. They're usually staffed by nurse practitioners or physician assistants that have been trained to treat common illnesses and complaints. They're usually fairly quick and inexpensive. However, if you have serious medical issues or chronic medical problems, these are probably not your best option. ° °No Primary Care Doctor: °- Call [facility] Connect at  [phone] - they can help you locate a primary care doctor that  accepts your insurance, provides certain services, etc. °- Physician Referral Service- 1-800-533-3463 ° °Chronic Pain Problems: °Organization         Address  Phone   Notes  °[facility]  ([phone]) [phone] Patients need to be referred by their primary care doctor.  ° °Medication  Assistance: °Organization         Address  Phone   Notes  °[place] [facility] [address] °[place], [place] [address] (336) 641-8030 --Must be a resident of [place] °-- Must have NO insurance coverage whatsoever (no Medicaid/ Medicare, etc.) °-- The pt. MUST have a primary care doctor that directs their care regularly and follows them in the community °  °[facility]  (866) 331-1348   °[employer]  (888) 892-1162   ° °Agencies that provide inexpensive medical care: °Organization         Address  Phone   Notes  °[facility]  (336) 832-8035   ° Internal Medicine    (336) 832-7272   °[facility] [address] °[place], [place] [address] (336) 832-4777   °[facility] [address], °[place] (336) 271-4999   °[facility]    (336) 373-0678   °[facility]    (336) 272-1050   °[facility] ° [address], [place] Phone:  (336) 832-4444, Fax:  (336) 832-4440 Hours of Operation:  9 am - 6 pm, M-F.  Also accepts Medicaid/Medicare and self-pay.  °[facility] ° [address], [place] Phone: (336) [phone], Fax: (336) [phone]. Hours of Operation:  8:30 am - 5:30 pm, M-F.  Also accepts Medicaid and self-pay.  °[facility] [place] [address], [place] Phone: (336) 878-6027   °[facility] [address], [place], [place] (336)723-1848, Ext. 123 Mondays & Thursdays: 7-9 AM.  First 15 patients are seen on a first come, first serve basis. °  ° °Medicaid-accepting [place] Providers: ° °Organization         Address  Phone   Notes  °[facility] [address], [place] (336) [phone] Also accepts self-pay patients.  °[facility] [address], [place] ° (336) [phone]   °[facility] [address], [place]  (336) 288-8857   °[facility] [address] [place] [address], [place] (336) 299-7000   °Veita Bland [address], [place]  ° (336) 373-1557 Only accepts [place] Access Medicaid patients after they have their name applied to their card.  ° °Self-Pay (no insurance) in [place]: ° °Organization         Address  Phone   Notes  °Sickle Cell Patients, [facility] [address], [place] (336) 832-1970   °[facility] [address], [place] (336) 832-4400   ° [facility] ° [address], [place] (336) 992-4800   °[facility]/Dr. Osei-Bonsu ° [address] [place] [address], [place] or [address], [place] (336) [phone] Phone number for both [place] and [place] locations is the same.  °[facility] [address], [place] (336) 299-0000   °[facility] [address] [place] [address], [place] or [address] (336) 852-7530 °(336) 878-2260   °[facility] [address], [place] (336) 350-1642, phone; (336) 294-5005, fax Sees patients 1st and 3rd Saturday of every month.  Must not qualify for public or private insurance (i.e. Medicaid, Medicare, [place] Health Choice, Veterans' Benefits) • Household income should be no more than 200% of the poverty level •The clinic cannot treat you if you are pregnant or think you are pregnant • Sexually transmitted diseases are not treated at the clinic.  ° ° °Dental Care: °Organization         Address  Phone  Notes  °[place] [facility] [address], [place] (336) 641-6152 Accepts children up to age 21 who are enrolled in Medicaid or [place] Health Choice; pregnant women with a Medicaid card; and children who have applied for Medicaid or [place] Health Choice, but were declined, whose parents can pay a reduced fee at time of service.  °[place]  [facility] [place]  [address], [place] (336) 641-7733 Accepts children up to age 21 who are enrolled in Medicaid or [place] Health Choice; pregnant women with a Medicaid card; and children who have applied for Medicaid or [place] Health Choice, but were declined, whose parents can pay a reduced fee at time of service.  °[facility] ° [address],  (336) 641-4533 Patients are seen by appointment only. Walk-ins are not accepted. [facility] will see patients 18 years of age and older. °Monday - Tuesday (8am-5pm) °Most Wednesdays (8:30-5pm) °$30 per visit, cash only  °[facility] ° [address], [place] (336) 641-4533 Patients are seen by appointment only. Walk-ins are not accepted. [facility] will see patients 18 years of age and older. °One   Wednesday Evening (Monthly: Volunteer Based).  $30 per visit, cash only  °[facility]  (919) [phone] for adults; Children under age 4, call Graduate Pediatric Dentistry at ([phone]) [phone]. Children aged 4-14, please call ([phone]) [phone] to request a pediatric application. ° Dental services are provided in all areas of dental care including fillings, crowns and bridges, complete and partial dentures, implants, gum treatment, root canals, and extractions. Preventive care is also provided. Treatment is provided to both adults and children. °Patients are selected via a lottery and there is often a waiting list. °  °[facility] [address], °[place] ° (336) 763-8833 [email or web address] °  °[facility] [address], [place], [place] (336)723-1848, Ext. 123 Second and Fourth Thursday of each month, opens at 6:30 AM; Clinic ends at 9 AM.  Patients are seen on a first-come first-served basis, and a limited number are seen during each clinic.  ° °[facility] ° [address], [place], [place] (336) [phone]    Eligibility Requirements °You must have lived in [place], or [place] counties for at least the last three months. °  You cannot be eligible for state or federal sponsored healthcare insurance, including Veterans Administration, Medicaid, or Medicare. °  You generally cannot be eligible for healthcare insurance through your employer.  °  How to apply: °Eligibility screenings are held every Tuesday and Wednesday afternoon from 1:00 pm until 4:00 pm. You do not need an appointment for the interview!  °[facility] [address], [place], [place] 336-631-2330   °[facility]  336-342-8273   °[facility]  336-703-3100   °[facility]  336-570-6415   ° °Behavioral Health Resources in the Community: °Intensive Outpatient Programs °Organization         Address  Phone  Notes  °[facility] [address], [place], [place] 336-878-6098   °[facility] [address], [place], [place] 336-832-9800   °[facility] [address], [place], [place] ° 336-882-2125   °[facility] [address],  °[place], [place] 1-800-853-5163 or 336-641-4981   °Substance Abuse Resources °Organization         Address  Phone  Notes  °[facility]  336-882-2125   °[facility]  336-784-9470   °[facility]  336-285-9073   °[facility]  336-845-3988   °Residential & Outpatient Substance Abuse Program  1-800-659-3381   °Psychological Services °Organization         Address  Phone  Notes  °[facility]  336- 832-9600   °[facility]  336- 378-7881   °[facility] [address], [place] 1-800-853-5163 or 336-641-4981   ° °Mobile Crisis Teams °Organization         Address  Phone  Notes  °[facility], Mobile Crisis Care Unit  1-877-626-1772   °Assertive °[facility] ° [address]  [place], [place] 336-834-9664   °Sharon DeEsch [address] °[place] 336-554-5454   ° °Self-Help/Support Groups °Organization         Address  Phone             Notes  °[facility] [place] - variety of support groups  336- 373-1402 Call for more information  °Narcotics Anonymous (NA), [facility] [address], °[place]  2 meetings at this location  ° °  Residential Treatment Programs Organization         Address  Phone  Notes  [facility] [address],    [place]  1-512-723-1287   [facility]  [address] [place] [id number], [place], [place]   [place], [place] [phone] Admissions: 8am-3pm M-F  [facility] [place] [address]    [place], [place] [phone]   [facility] [address] [place], [place], [place]   [facility] [address]  [place], [place]   [facility] - Intensive Outpatient [address], [place], [place]   [facility] [address]  [place], [place] 1-(321)103-1524 or 207 065 6220   [facility] [address] [place], [place] Accepts Medicaid  [facility] [address]  [place] 1-579-570-1333 Substance Abuse/Addiction Treatment   [facility]         Address  Phone  Notes  [facility]  912-478-2271   Domenic Schwab, PhD [address] Arlis Porta [place], [place]   (386)123-4341 or (419) 820-9166   [facility] [place] [facility] [place], [place] (414) 598-4017   [facility] [address], [place], [place] 336-102-1391 Insurance/Medicaid/sponsorship through [facility] [address]                                    [place], [place] 508-589-1867 [place] [address], [place] [phone]    Dr. Adele Schilder  ([phone]   [facility] Dept. 1) [address] [place] 2) [place] 3)  [address], [place] ([phone]) [phone]  (367) [phone]   [place] (260) [phone] or (915)489-9672 (After Hours)      Take the prescriptions as directed.  Use your albuterol inhaler (2 to 4 puffs) or your albuterol nebulizer (1 unit dose) every 4 hours for the next 7 days, then as needed for cough, wheezing, or shortness of breath.  Take over the counter decongestant (such as sudafed), as directed on packaging, for the next week. Continue to take your antihistamine.  Use over the counter normal saline nasal spray (or your Neti Pot), as instructed in the Emergency Department, several times per day for the next 2 weeks.  Call your regular medical doctor on Tuesday to schedule a follow up appointment this week.  Return to the Emergency Department immediately if worsening.

## 2015-05-25 LAB — CULTURE, GROUP A STREP: Strep A Culture: NEGATIVE

## 2015-06-03 ENCOUNTER — Ambulatory Visit (INDEPENDENT_AMBULATORY_CARE_PROVIDER_SITE_OTHER): Payer: Medicare Other | Admitting: Clinical

## 2015-06-03 ENCOUNTER — Encounter (HOSPITAL_COMMUNITY): Payer: Self-pay | Admitting: Clinical

## 2015-06-03 ENCOUNTER — Encounter (HOSPITAL_COMMUNITY): Payer: Self-pay | Admitting: Emergency Medicine

## 2015-06-03 ENCOUNTER — Telehealth (HOSPITAL_COMMUNITY): Payer: Self-pay

## 2015-06-03 ENCOUNTER — Emergency Department (HOSPITAL_COMMUNITY)
Admission: EM | Admit: 2015-06-03 | Discharge: 2015-06-03 | Discharge status: Against Medical Advice | Payer: Medicare Other | Attending: Emergency Medicine | Admitting: Emergency Medicine

## 2015-06-03 ENCOUNTER — Ambulatory Visit: Payer: Medicare Other | Admitting: Family

## 2015-06-03 DIAGNOSIS — F3160 Bipolar disorder, current episode mixed, unspecified: Secondary | ICD-10-CM

## 2015-06-03 DIAGNOSIS — I2510 Atherosclerotic heart disease of native coronary artery without angina pectoris: Secondary | ICD-10-CM | POA: Insufficient documentation

## 2015-06-03 DIAGNOSIS — F250 Schizoaffective disorder, bipolar type: Secondary | ICD-10-CM

## 2015-06-03 DIAGNOSIS — G8929 Other chronic pain: Secondary | ICD-10-CM | POA: Insufficient documentation

## 2015-06-03 DIAGNOSIS — I10 Essential (primary) hypertension: Secondary | ICD-10-CM | POA: Insufficient documentation

## 2015-06-03 DIAGNOSIS — I509 Heart failure, unspecified: Secondary | ICD-10-CM | POA: Insufficient documentation

## 2015-06-03 DIAGNOSIS — I252 Old myocardial infarction: Secondary | ICD-10-CM | POA: Insufficient documentation

## 2015-06-03 DIAGNOSIS — J449 Chronic obstructive pulmonary disease, unspecified: Secondary | ICD-10-CM | POA: Insufficient documentation

## 2015-06-03 DIAGNOSIS — R51 Headache: Secondary | ICD-10-CM | POA: Insufficient documentation

## 2015-06-03 DIAGNOSIS — R440 Auditory hallucinations: Secondary | ICD-10-CM | POA: Insufficient documentation

## 2015-06-03 MED ORDER — RISPERIDONE 1 MG PO TABS: 1.0000 mg | ORAL_TABLET | Freq: Every day | ORAL | Status: DC

## 2015-06-03 NOTE — ED Notes (Signed)
Per pt, states she started hearing voices 2 days ago-states she is taking meds-states she has appointment with therapist at 0900 and an appointment with psyche tomorrow-states wont shut up and she has a headache

## 2015-06-03 NOTE — Telephone Encounter (Signed)
Medication problem - Pt. in for therapy with Audelia Acton and reports positive auditory hallucinations, minimal sleep and increased depressive symptoms over the past few days.  Pt. crying and stated she came for appt. after trip to ED.   Patient admitted to increased family stressors over the holidays.  Consulted with Dr. Adele Schilder who met with patient and agreed for patient to discontinue Lutuda and start Risperdal 32m, one at bedtime.  [facility]and patient will keep already scheduled evaluation with Dr. AAdele Schilderon 06/04/15 for follow up.  Patient agreed to start new medication tonight this date and will inform on 06/04/15 if helps.  [facility]ed in [facility] [place] as they fix medication boxes for patient and collateral reported this would be changed this date. Patient to return to see Dr. AAdele Schilderon 06/04/15.

## 2015-06-04 ENCOUNTER — Telehealth: Payer: Self-pay | Admitting: Family Medicine

## 2015-06-04 ENCOUNTER — Ambulatory Visit: Payer: Medicare Other | Admitting: Pediatrics

## 2015-06-04 ENCOUNTER — Encounter (HOSPITAL_COMMUNITY): Payer: Self-pay | Admitting: Psychiatry

## 2015-06-04 ENCOUNTER — Ambulatory Visit (INDEPENDENT_AMBULATORY_CARE_PROVIDER_SITE_OTHER): Payer: Medicare Other | Admitting: Psychiatry

## 2015-06-04 VITALS — BP 106/80 | HR 110 | Resp 16 | Ht 69.0 in | Wt 354.0 lb

## 2015-06-04 DIAGNOSIS — F3160 Bipolar disorder, current episode mixed, unspecified: Secondary | ICD-10-CM | POA: Diagnosis not present

## 2015-06-04 MED ORDER — RISPERIDONE 1 MG PO TABS: 1.0000 mg | ORAL_TABLET | Freq: Every day | ORAL | Status: DC

## 2015-06-04 NOTE — Telephone Encounter (Signed)
Allison Sharp, can you take care of this

## 2015-06-04 NOTE — Progress Notes (Signed)
[facility] [phone] Progress Note  Allison Sharp [id number] 60 y.o.  06/04/2015 9:24 AM  Chief Complaint:  I am under a lot of stress because I'm not getting along with niece.  She is not helping me.  I cannot sleep.  I have a lot of irritability anger.  But I like Risperdal which I took yesterday.     History of Present Illness:  Allison Sharp came earlier than her scheduled appointment.  She had a very difficult Christmas.  She was sick and having bronchitis and recently finished antibiotic.  She also endorsed increase family problem especially with her niece.  She is not happy that she is not supporting her financially.  Patient is feeling overwhelmed due to financial burden.  She admitted poor sleep, irritability, anger, mood swing and admitted having hallucination and paranoia.  Yesterday she saw Allison Sharp and she endorse having hallucination and she appeared very stressful emotional and tearful.  She do not self Latuda helping and I recommended to discontinue and try Risperdal.  She took last night Risperdal 1 mg and she feels somewhat better.  She slept at least 5 hours.  She endorse her hallucinations are gone.  She is also compliant with Cymbalta, Depakote and reported no side effects.  She has no tremors or shakes.  Her last Depakote level was 4 months ago and it was 97.  Patient denies any suicidal parts or homicidal thought.  Yesterday she also went to emergency room however she was recommended to see therapist as she had appointment.  Patient had blood work in November and her hemoglobin A1c is 5.8 .  Patient lives with her niece and her 65 year old child.  She is not sure how long she can continue living with her.  Patient denies drinking or using any illegal substances.  Suicidal Ideation: No Plan Formed: No Patient has means to carry out plan: No  Homicidal Ideation: No Plan Formed: No Patient has means to carry out plan: No  Past Psychiatric History/Hospitalization(s): Patient  has history of psychiatric illness and treatment.  She has at least 4-5 psychiatric hospitalization.  She has history of suicidal attempt in the past by taking overdose on her medication.  She has diagnosed with PTSD, major depressive disorder and bipolar disorder.  In the past she had tried Paxil, Prozac, Wellbutrin, Effexor, Lexapro, amitriptyline, Cymbalta, Neurontin, Depakote, trazodone, Thorazine.  During her last psychiatric hospitalization she was given hydroxyzine, Luvox, Sinequan, Zyprexa .  Recently she was given [place] however to stop due to poor response.  She was seeing this Probation officer in [place] and then Dr. Gilford Sharp.  She has history of psychosis, mania, severe mood swing and depression. Anxiety: Yes Bipolar Disorder: Yes Depression: Yes Mania: Yes Psychosis: Yes Schizophrenia: No Personality Disorder: No Hospitalization for psychiatric illness: Yes History of Electroconvulsive Shock Therapy: No Prior Suicide Attempts: Yes  Medical History; Patient has multiple health problem.  She has hyperlipidemia, GERD, chronic back pain, arthritis, sleep apnea, hypertension, COPD, obesity, thyroid problem, coronary artery disease, headaches and she had history of knee arthroscopy, tonsillectomy, back surgery, hysterectomy, cholecystectomy, hernia repair, joint replacement and appendectomy.  Family History; Patient sister, nieces, son has psychiatric issues.  Her sister and niece has been seen in this office.  Her son has drug problem.  Review of Systems: Psychiatric: Agitation: Irritability Hallucination: No Depressed Mood: Yes Insomnia: Yes Hypersomnia: No Altered Concentration: No Feels Worthless: No Grandiose Ideas: No Belief In Special Powers: No New/Increased Substance Abuse: No Compulsions: No  Neurologic:  Headache: No Seizure: No Paresthesias: No   Outpatient Encounter Prescriptions as of 06/04/2015  Medication Sig  . acetaminophen (TYLENOL) 500 MG tablet Take 1,000 mg by  mouth every 6 (six) hours as needed for mild pain.  Marland Kitchen albuterol (PROVENTIL HFA;VENTOLIN HFA) 108 (90 BASE) MCG/ACT inhaler Inhale 2 puffs into the lungs every 4 (four) hours as needed for wheezing or shortness of breath.  Marland Kitchen albuterol (PROVENTIL) (2.5 MG/3ML) 0.083% nebulizer solution Take 3 mLs (2.5 mg total) by nebulization every 4 (four) hours as needed for wheezing or shortness of breath.  Marland Kitchen azithromycin (ZITHROMAX) 250 MG tablet Take 2 the first day and then one each day after. (Patient not taking: Reported on 05/23/2015)  . cetirizine (ZYRTEC HIVES RELIEF) 10 MG tablet Take 1 tablet (10 mg total) by mouth daily.  . cloNIDine (CATAPRES) 0.1 MG tablet Take 1 tablet (0.1 mg total) by mouth at bedtime.  . Clotrimazole 1 % OINT Apply 1 application topically 2 (two) times daily as needed (SKIN RASHES).  Marland Kitchen diclofenac sodium (VOLTAREN) 1 % GEL Apply to affected area twice daily as needed  . divalproex (DEPAKOTE) 500 MG DR tablet Take 1 tablet (500 mg total) by mouth 2 (two) times daily. (Patient taking differently: Take 1,000 mg by mouth at bedtime. )  . doxepin (SINEQUAN) 25 MG capsule Take 1 capsule by mouth daily.  . DULoxetine (CYMBALTA) 60 MG capsule Take 1 capsule (60 mg total) by mouth daily.  . fluticasone (FLONASE) 50 MCG/ACT nasal spray Place 1 spray into both nostrils 2 (two) times daily as needed for allergies or rhinitis.  Marland Kitchen levothyroxine (SYNTHROID, LEVOTHROID) 112 MCG tablet Take 1 tablet (112 mcg total) by mouth daily before breakfast. For hypothyroidism.  Marland Kitchen lisinopril-hydrochlorothiazide (PRINZIDE,ZESTORETIC) 20-25 MG tablet Take 1 tablet by mouth daily.  . Multiple Vitamin (MULTIVITAMIN WITH MINERALS) TABS Take 1 tablet by mouth daily. For nutritional supplementation.  Marland Kitchen omeprazole (PRILOSEC) 20 MG capsule Take 1 capsule (20 mg total) by mouth daily.  . risperiDONE (RISPERDAL) 1 MG tablet Take 1 tablet (1 mg total) by mouth at bedtime.  . [DISCONTINUED] albuterol (PROVENTIL  HFA;VENTOLIN HFA) 108 (90 BASE) MCG/ACT inhaler Inhale 2 puffs into the lungs every 6 (six) hours as needed for wheezing or shortness of breath.  . [DISCONTINUED] benzonatate (TESSALON) 100 MG capsule Take 1 capsule (100 mg total) by mouth 3 (three) times daily as needed for cough.  . [DISCONTINUED] dextromethorphan-guaiFENesin (MUCINEX DM) 30-600 MG 12hr tablet Take 1 tablet by mouth 2 (two) times daily as needed for cough.  . [DISCONTINUED] meloxicam (MOBIC) 7.5 MG tablet Take 1 tablet (7.5 mg total) by mouth daily. (Patient not taking: Reported on 05/23/2015)  . [DISCONTINUED] predniSONE (DELTASONE) 20 MG tablet Take 2 tablets (40 mg total) by mouth daily.  . [DISCONTINUED] risperiDONE (RISPERDAL) 1 MG tablet Take 1 tablet (1 mg total) by mouth at bedtime.   No facility-administered encounter medications on file as of 06/04/2015.    Recent Results (from the past 2160 hour(s))  Basic metabolic panel     Status: Abnormal   Collection Time: 03/29/15  4:19 PM  Result Value Ref Range   Sodium 136 135 - 146 mmol/L   Potassium 3.7 3.5 - 5.3 mmol/L   Chloride 96 (L) 98 - 110 mmol/L   CO2 28 20 - 31 mmol/L   Glucose, Bld 99 65 - 99 mg/dL   BUN 14 7 - 25 mg/dL   Creat 0.95 0.50 - 0.99 mg/dL   Calcium 9.9  8.6 - 10.4 mg/dL  Hemoglobin A1c     Status: Abnormal   Collection Time: 03/30/15  3:16 PM  Result Value Ref Range   Hgb A1c MFr Bld 5.8 (H) <5.7 %    Comment:                                                                        According to the ADA Clinical Practice Recommendations for 2011, when HbA1c is used as a screening test:     >=6.5%   Diagnostic of Diabetes Mellitus            (if abnormal result is confirmed)   5.7-6.4%   Increased risk of developing Diabetes Mellitus   References:Diagnosis and Classification of Diabetes Mellitus,Diabetes ZOXW,9604,54(UJWJX 1):S62-S69 and Standards of Medical Care in         Diabetes - 2011,Diabetes Care,2011,34 (Suppl 1):S11-S61.       Mean Plasma Glucose 120 (H) <117 mg/dL  Rapid strep screen     Status: None   Collection Time: 05/23/15  2:30 PM  Result Value Ref Range   Streptococcus, Group A Screen (Direct) NEGATIVE NEGATIVE    Comment: (NOTE) A Rapid Antigen test may result negative if the antigen level in the sample is below the detection level of this test. The FDA has not cleared this test as a stand-alone test therefore the rapid antigen negative result has reflexed to a Group A Strep culture.   Culture, Group A Strep     Status: None   Collection Time: 05/23/15  2:30 PM  Result Value Ref Range   Strep A Culture Negative     Comment: (NOTE) Performed At: [facility] [address] [place], [place] [id number] Lindon Romp MD OZ:[id number]   Troponin I     Status: None   Collection Time: 05/23/15  3:41 PM  Result Value Ref Range   Troponin I <0.03 <0.031 ng/mL    Comment:        NO INDICATION OF MYOCARDIAL INJURY.   CBC with Differential     Status: Abnormal   Collection Time: 05/23/15  3:41 PM  Result Value Ref Range   WBC 6.5 4.0 - 10.5 K/uL   RBC 4.76 3.87 - 5.11 MIL/uL   Hemoglobin 14.6 12.0 - 15.0 g/dL   HCT 42.4 36.0 - 46.0 %   MCV 89.1 78.0 - 100.0 fL   MCH 30.7 26.0 - 34.0 pg   MCHC 34.4 30.0 - 36.0 g/dL   RDW 12.8 11.5 - 15.5 %   Platelets 117 (L) 150 - 400 K/uL    Comment: SPECIMEN CHECKED FOR CLOTS PLATELET COUNT CONFIRMED BY SMEAR    Neutrophils Relative % 40 %   Neutro Abs 2.6 1.7 - 7.7 K/uL   Lymphocytes Relative 42 %   Lymphs Abs 2.7 0.7 - 4.0 K/uL   Monocytes Relative 14 %   Monocytes Absolute 0.9 0.1 - 1.0 K/uL   Eosinophils Relative 3 %   Eosinophils Absolute 0.2 0.0 - 0.7 K/uL   Basophils Relative 1 %   Basophils Absolute 0.0 0.0 - 0.1 K/uL  Brain natriuretic peptide     Status: None   Collection Time: 05/23/15  3:41 PM  Result Value  Ref Range   B Natriuretic Peptide 20.0 0.0 - 100.0 pg/mL  Basic metabolic panel     Status: Abnormal   Collection Time:  05/23/15  3:41 PM  Result Value Ref Range   Sodium 137 135 - 145 mmol/L   Potassium 3.7 3.5 - 5.1 mmol/L   Chloride 100 (L) 101 - 111 mmol/L   CO2 30 22 - 32 mmol/L   Glucose, Bld 105 (H) 65 - 99 mg/dL   BUN 15 6 - 20 mg/dL   Creatinine, Ser 0.92 0.44 - 1.00 mg/dL   Calcium 9.2 8.9 - 10.3 mg/dL   GFR calc non Af Amer >60 >60 mL/min   GFR calc Af Amer >60 >60 mL/min    Comment: (NOTE) The eGFR has been calculated using the CKD EPI equation. This calculation has not been validated in all clinical situations. eGFR's persistently <60 mL/min signify possible Chronic Kidney Disease.    Anion gap 7 5 - 15      Constitutional:  BP 106/80 mmHg  Pulse 110  Resp 16  Ht _0  (1.753 m)  Wt 354 lb (160.573 kg)  BMI 52.25 kg/m2  LMP 02/18/1995   Musculoskeletal: Strength & Muscle Tone: decreased Gait & Station: unsteady Patient leans: Backward  Psychiatric Specialty Exam: General Appearance: Casual  Eye Contact::  Fair  Speech:  Pressured and At times rambling  Volume:  Normal  Mood:  Irritable  Affect:  Constricted and Labile  Thought Process:  Circumstantial  Orientation:  Full (Time, Place, and Person)  Thought Content:  Rumination  Suicidal Thoughts:  No  Homicidal Thoughts:  No  Memory:  Immediate;   Fair Recent;   Fair Remote;   Fair  Judgement:  Intact  Insight:  Fair  Psychomotor Activity:  Increased  Concentration:  Fair  Recall:  [employer] of Knowledge:  Fair  Language:  Fair  Akathisia:  No  Handed:  Right  AIMS (if indicated):     Assets:  Communication Skills Desire for Improvement Housing  ADL's:  Intact  Cognition:  Impaired,  Mild  Sleep:        Established Problem, Stable/Improving (1), Review of Psycho-Social Stressors (1), Review or order clinical lab tests (1), Review and summation of old records (2), Established Problem, Worsening (2), Review of Last Therapy Session (1), Review of Medication Regimen & Side Effects (2) and Review of New  Medication or Change in Dosage (2)  Assessment: Axis I: Bipolar disorder mixed.  Rule out schizoaffective disorder depressed type, PTSD, rule out major depressive disorder recurrent  Axis II: Deferred  Axis III:  Past Medical History  Diagnosis Date  . Hyperlipidemia   . CAD (coronary artery disease)   . Bipolar 1 disorder ([place])   . GERD (gastroesophageal reflux disease)   . Hyperthyroidism   . Chronic back pain   . Dyspnea     PFT 03/05/09 FEV1 2.77(98%), FVC 3.25(86%), FEV1% 85, TLC 5.88(99%), DLCO 60% ,  Methacholine challenge 03/16/09 normal ,  CT chest 03/12/09 no pulmonary disease  . Anxiety   . Arthritis   . Depression   . OSA on CPAP     2 liters  . HTN (hypertension)   . History of colonoscopy 10/17/2002    by Dr Rehman-> distal non-specific proctitis, small ext hemorrhoids,   . Fungal infection   . Cellulitis   . Contusion of sacrum   . Migraine headache   . Vitamin D deficiency   . Sleep apnea   .  Myocardial infarction ([place])     NOV 1997  . Hypothyroidism     States she only has hyperthyroidism  . Complication of anesthesia     States she typically gets sick s/p anesthesia  . Morbid obesity with body mass index of 50.0-59.9 in adult [facility]) JAN 2011 370 LBS    2004 311 BMI 45.9  . PTSD (post-traumatic stress disorder)   . Asthma   . Allergy   . Urine incontinence   . Chronic headaches   . Suicidal ideation   . COPD (chronic obstructive pulmonary disease) ([place])   . CHF (congestive heart failure) ([facility])     diastolic dysfunction     Plan:  I discuss her psychosocial stressors and emphasize her living situation to improve.  Discontinue Latuda as she like Risperdal and recommended to continue Risperdal 1 mg at bedtime.  Discussed medication side effects especially weight gain, EPS , tremors and shakes.  I also reviewed her blood work her hemoglobin A1c is normal.  She recently finished antibiotic and I reviewed collateral information from emergency room.   Patient has appointment to see her primary care physician this afternoon for follow-up.  Continue Depakote thousand milligram at bedtime, Cymbalta 60 mg daily.  Encouraged to keep appointment with Allison Sharp for coping and social skills.  Patient is getting Sinequan from her primary care physician.  Recommended to call us back if she has any question or any concern.  Follow-up in 4 weeks. Discuss safety plan that anytime having active suicidal thoughts or homicidal thoughts and she need to call 911 or go to local emergency room.    Heraclio Seidman T., MD 06/04/2015

## 2015-06-05 NOTE — Progress Notes (Addendum)
   THERAPIST PROGRESS NOTE  Session Time: 9:00 - 9:55  Participation Level: Active  Behavioral Response: CasualAlertAnxious, Depressed and troubled by hallucinations  Type of Therapy: Individual Therapy  Treatment Goals addressed: Crisis care and assessment  Interventions: Motivational Interviewing and grounding techniques   Summary: Allison Sharp is a 60 y.o. female who presents with schizoaffective disorder, bipolar type.   Suicidal/Homicidal: Nowithout intent/plan  Therapist Response: Allison Sharp met with clinician for an individual session. She was crying when she sat down. She shared that she had went to the emergency room prior to coming to clinician office because she has been hearing a lot of voices and was feeling overwhelmed by them. She shared that she was told to she should come to her appointment with clinician because we could help her.  Clinician asked Allison Climes, RN to join [name]. Allison Sharp shared with clinician and Allison Sharp about her symptoms and her belief that the holiday stresses had exasperated them. Allison Sharp contacted Dr. Adele Schilder who made came in briefly and  Made an adjustment in her medication.   Clinician introduced a grounding technique which clint and clinician practiced together. [name] reported that her head was quiter and that she felt calmer. Client and clinician discussed the process and her attention.  Client and clinician then worked together to update her assessment. She had an assessment in October but had not followed through with therapy Client and clinician then practiced the grounding technique again so that Allison Sharp could practice it on her own, which she agreed to do until next session. Allison Sharp shared at the end of the session that she felt much better and was grateful to all the staff for helping.   Plan: Return again in 1 weeks.  Diagnosis: Axis I: schizoaffective disorder, bipolar type        Sadao Weyer A, LCSW 06/05/2015

## 2015-06-07 ENCOUNTER — Telehealth (HOSPITAL_COMMUNITY): Payer: Self-pay

## 2015-06-07 NOTE — Telephone Encounter (Signed)
Telephone call with [facility] after speaking with patient and Dr. Adele Schilder to verify patient is still taking Cymbalta 60 mg one a day.  Called the pharmacy to verify their order from 05/04/15 plus one refill was still correct for them to complete patient's medication box.

## 2015-06-09 NOTE — Telephone Encounter (Signed)
New rx and ins info sent by fax to [facility]

## 2015-06-11 ENCOUNTER — Other Ambulatory Visit: Payer: Self-pay | Admitting: Family Medicine

## 2015-06-21 ENCOUNTER — Encounter (HOSPITAL_COMMUNITY): Payer: Self-pay | Admitting: Clinical

## 2015-06-21 ENCOUNTER — Ambulatory Visit (INDEPENDENT_AMBULATORY_CARE_PROVIDER_SITE_OTHER): Payer: Medicare Other | Admitting: Clinical

## 2015-06-21 DIAGNOSIS — F250 Schizoaffective disorder, bipolar type: Secondary | ICD-10-CM

## 2015-06-21 NOTE — Progress Notes (Signed)
THERAPIST PROGRESS NOTE  Session Time: 9:01 -9:58  Participation Level: Active  Behavioral Response: CasualAlertNA  Type of Therapy: Individual Therapy  Treatment Goals addressed: Improve Psychiatric Symptoms, Elevate Mood, Improve Unhelpful Thought Patterns, Emotional Regulation Skills, Reduce hallucinations  Interventions: Motivational Interviewing, CBT, Grounding & Mindfulness Techniques  Summary: Allison Sharp is a 59. y.o. female who presents with Schizoaffective disorder, bipolar type  Suicidal/Homicidal: No -without intent/plan  Therapist Response: Allison Sharp met with clinician for an individual session. Allison Sharp discussed her psychiatric symptoms, her current life events and her homework. Allison Sharp shared that she was doing better since last session. She shared that her medications are working well and that the voices have been quitter. Allison Sharp shared that she has been practicing her grounding techniques. She stated that she finds them helpful in calming herself down and distracting her (sometimes from negative thoughts). Client shared that when she has less anxiety the voices are also softer. She shared that she is having "conversations with herself in her head" that is a way of "reasoning with herself" - She stated that sometimes it is good but often at night time, it is bothersome because "It is difficult to get it to shut off.". Clinician suggested she try a grounding or mindfulness technique prior to going to sleep or to use a white noise machine.  Clinician introduced an additional techniques which client and clinician practiced. Allison Sharp shared that she has difficulty due to negative thoughts. Clinician introduced some basic cbt concepts. Client and clinician began a conversation about challenging and changing negative thoughts. Clinician briefly gave an example of the process. Client and clinician agreed to explore this further at a future session. Allison Sharp agreed to complete a homework packet on  Distress Tolerance (emotional regulation) before next session and practice her grounding and mindfulness techniques daily.   Plan: Return again in 1- 2 week  Diagnosis:     Axis I: Schizoaffective disorder, bipolar type          , A, LCSW 06/21/2015  

## 2015-06-23 DIAGNOSIS — G4733 Obstructive sleep apnea (adult) (pediatric): Secondary | ICD-10-CM | POA: Diagnosis not present

## 2015-06-23 DIAGNOSIS — R0902 Hypoxemia: Secondary | ICD-10-CM | POA: Diagnosis not present

## 2015-06-24 ENCOUNTER — Telehealth (HOSPITAL_COMMUNITY): Payer: Self-pay

## 2015-06-24 DIAGNOSIS — F3160 Bipolar disorder, current episode mixed, unspecified: Secondary | ICD-10-CM

## 2015-06-24 NOTE — Telephone Encounter (Signed)
Medication refill request - fax received from [facility] requesting a refill of patient's Depakote, last ordered 05/04/15 + 1 refill.  Patient filled last on 06/03/15 and needs refill prior to evaluation set for 07/06/15.

## 2015-06-28 ENCOUNTER — Encounter: Payer: Self-pay | Admitting: Pharmacist

## 2015-06-28 ENCOUNTER — Ambulatory Visit (INDEPENDENT_AMBULATORY_CARE_PROVIDER_SITE_OTHER): Payer: Medicare Other | Admitting: Pharmacist

## 2015-06-28 VITALS — BP 110/62 | HR 68 | Ht 69.0 in | Wt 354.0 lb

## 2015-06-28 DIAGNOSIS — Z114 Encounter for screening for human immunodeficiency virus [HIV]: Secondary | ICD-10-CM

## 2015-06-28 DIAGNOSIS — R7303 Prediabetes: Secondary | ICD-10-CM

## 2015-06-28 DIAGNOSIS — Z0000 Encounter for general adult medical examination without abnormal findings: Secondary | ICD-10-CM | POA: Diagnosis not present

## 2015-06-28 DIAGNOSIS — Z1159 Encounter for screening for other viral diseases: Secondary | ICD-10-CM

## 2015-06-28 DIAGNOSIS — R531 Weakness: Secondary | ICD-10-CM

## 2015-06-28 NOTE — Telephone Encounter (Signed)
30 days only.

## 2015-06-28 NOTE — Patient Instructions (Addendum)
Allison Sharp , Thank you for taking time to come for your Medicare Wellness Visit. I appreciate your ongoing commitment to your health goals. Please review the following plan we discussed and let me know if I can assist you in the future.   These are the goals we discussed: Increase physical activity as able - try Chair exercises from handout given.   I am also going to send referral physical therapy for balance and strength assessment. Return to office Friday, February 3rd for Zostavax and labs.  Increase non-starchy vegetables - carrots, green bean, squash, zucchini, tomatoes, onions, peppers, spinach and other green leafy vegetables, cabbage, lettuce, cucumbers, asparagus, okra (not fried), eggplant limit sugar and processed foods (cakes, cookies, ice cream, crackers and chips) Increase fresh fruit but limit serving sizes 1/2 cup or about the size of tennis or baseball limit red meat to no more than 1-2 times per week (serving size about the size of your palm) Choose whole grains / lean proteins - whole wheat bread, quinoa, whole grain rice (1/2 cup), fish, chicken, [place]   This is a list of the screening recommended for you and due dates:  Health Maintenance  Topic Date Due  . Shingles Vaccine  Due now - will get 07/02/15  .  Hepatitis C: One time screening is recommended by Center for Disease Control  (CDC) for  adults born from [birth year] through 1967.   Due now - will get 07/02/15  . HIV Screening  Due now - will get 07/02/15  . Flu Shot  12/28/2015  . Pap Smear  07/01/2016  . Mammogram  02/04/2017  . Colon Cancer Screening  05/29/2017  . Tetanus Vaccine  10/01/2022  *Topic was postponed. The date shown is not the original due date.   Health Maintenance, Female Adopting a healthy lifestyle and getting preventive care can go a long way to promote health and wellness. Talk with your health care provider about what schedule of regular examinations is right for you. This is a good chance for you  to check in with your provider about disease prevention and staying healthy. In between checkups, there are plenty of things you can do on your own. Experts have done a lot of research about which lifestyle changes and preventive measures are most likely to keep you healthy. Ask your health care provider for more information. WEIGHT AND DIET  Eat a healthy diet  Be sure to include plenty of vegetables, fruits, low-fat dairy products, and lean protein.  Do not eat a lot of foods high in solid fats, added sugars, or salt.  Get regular exercise. This is one of the most important things you can do for your health.  Most adults should exercise for at least 150 minutes each week. The exercise should increase your heart rate and make you sweat (moderate-intensity exercise).  Most adults should also do strengthening exercises at least twice a week. This is in addition to the moderate-intensity exercise.  Maintain a healthy weight  Body mass index (BMI) is a measurement that can be used to identify possible weight problems. It estimates body fat based on height and weight. Your health care provider can help determine your BMI and help you achieve or maintain a healthy weight.  For females 9 years of age and older:   A BMI below 18.5 is considered underweight.  A BMI of 18.5 to 24.9 is normal.  A BMI of 25 to 29.9 is considered overweight.  A BMI of 30 and  above is considered obese.  Watch levels of cholesterol and blood lipids  You should start having your blood tested for lipids and cholesterol at 62 years of age, then have this test every 5 years.  You may need to have your cholesterol levels checked more often if:  Your lipid or cholesterol levels are high.  You are older than 62 years of age.  You are at high risk for heart disease.  CANCER SCREENING   Lung Cancer  Lung cancer screening is recommended for adults 82-65 years old who are at high risk for lung cancer because  of a history of smoking.  A yearly low-dose CT scan of the lungs is recommended for people who:  Currently smoke.  Have quit within the past 15 years.  Have at least a 30-pack-year history of smoking. A pack year is smoking an average of one pack of cigarettes a day for 1 year.  Yearly screening should continue until it has been 15 years since you quit.  Yearly screening should stop if you develop a health problem that would prevent you from having lung cancer treatment.  Breast Cancer  Practice breast self-awareness. This means understanding how your breasts normally appear and feel.  It also means doing regular breast self-exams. Let your health care provider know about any changes, no matter how small.  If you are in your 20s or 30s, you should have a clinical breast exam (CBE) by a health care provider every 1-3 years as part of a regular health exam.  If you are 64 or older, have a CBE every year. Also consider having a breast X-ray (mammogram) every year.  If you have a family history of breast cancer, talk to your health care provider about genetic screening.  If you are at high risk for breast cancer, talk to your health care provider about having an MRI and a mammogram every year.  Breast cancer gene (BRCA) assessment is recommended for women who have family members with BRCA-related cancers. BRCA-related cancers include:  Breast.  Ovarian.  Tubal.  Peritoneal cancers.  Results of the assessment will determine the need for genetic counseling and BRCA1 and BRCA2 testing. Cervical Cancer Your health care provider may recommend that you be screened regularly for cancer of the pelvic organs (ovaries, uterus, and vagina). This screening involves a pelvic examination, including checking for microscopic changes to the surface of your cervix (Pap test). You may be encouraged to have this screening done every 3 years, beginning at age 59.  For women ages 51-65, health care  providers may recommend pelvic exams and Pap testing every 3 years, or they may recommend the Pap and pelvic exam, combined with testing for human papilloma virus (HPV), every 5 years. Some types of HPV increase your risk of cervical cancer. Testing for HPV may also be done on women of any age with unclear Pap test results.  Other health care providers may not recommend any screening for nonpregnant women who are considered low risk for pelvic cancer and who do not have symptoms. Ask your health care provider if a screening pelvic exam is right for you.  If you have had past treatment for cervical cancer or a condition that could lead to cancer, you need Pap tests and screening for cancer for at least 20 years after your treatment. If Pap tests have been discontinued, your risk factors (such as having a new sexual partner) need to be reassessed to determine if screening should resume. Some  women have medical problems that increase the chance of getting cervical cancer. In these cases, your health care provider may recommend more frequent screening and Pap tests. Colorectal Cancer  This type of cancer can be detected and often prevented.  Routine colorectal cancer screening usually begins at 62 years of age and continues through 62 years of age.  Your health care provider may recommend screening at an earlier age if you have risk factors for colon cancer.  Your health care provider may also recommend using home test kits to check for hidden blood in the stool.  A small camera at the end of a tube can be used to examine your colon directly (sigmoidoscopy or colonoscopy). This is done to check for the earliest forms of colorectal cancer.  Routine screening usually begins at age 69.  Direct examination of the colon should be repeated every 5-10 years through 62 years of age. However, you may need to be screened more often if early forms of precancerous polyps or small growths are found. Skin  Cancer  Check your skin from head to toe regularly.  Tell your health care provider about any new moles or changes in moles, especially if there is a change in a mole's shape or color.  Also tell your health care provider if you have a mole that is larger than the size of a pencil eraser.  Always use sunscreen. Apply sunscreen liberally and repeatedly throughout the day.  Protect yourself by wearing long sleeves, pants, a wide-brimmed hat, and sunglasses whenever you are outside. HEART DISEASE, DIABETES, AND HIGH BLOOD PRESSURE   High blood pressure causes heart disease and increases the risk of stroke. High blood pressure is more likely to develop in:  People who have blood pressure in the high end of the normal range (130-139/85-89 mm Hg).  People who are overweight or obese.  People who are African American.  If you are 86-70 years of age, have your blood pressure checked every 3-5 years. If you are 25 years of age or older, have your blood pressure checked every year. You should have your blood pressure measured twice--once when you are at a hospital or clinic, and once when you are not at a hospital or clinic. Record the average of the two measurements. To check your blood pressure when you are not at a hospital or clinic, you can use:  An automated blood pressure machine at a pharmacy.  A home blood pressure monitor.  If you are between 73 years and 9 years old, ask your health care provider if you should take aspirin to prevent strokes.  Have regular diabetes screenings. This involves taking a blood sample to check your fasting blood sugar level.  If you are at a normal weight and have a low risk for diabetes, have this test once every three years after 62 years of age.  If you are overweight and have a high risk for diabetes, consider being tested at a younger age or more often. PREVENTING INFECTION  Hepatitis B  If you have a higher risk for hepatitis B, you should be  screened for this virus. You are considered at high risk for hepatitis B if:  You were born in a country where hepatitis B is common. Ask your health care provider which countries are considered high risk.  Your parents were born in a high-risk country, and you have not been immunized against hepatitis B (hepatitis B vaccine).  You have HIV or AIDS.  You  use needles to inject street drugs.  You live with someone who has hepatitis B.  You have had sex with someone who has hepatitis B.  You get hemodialysis treatment.  You take certain medicines for conditions, including cancer, organ transplantation, and autoimmune conditions. Hepatitis C  Blood testing is recommended for:  Everyone born from [birth year] through 1967.  Anyone with known risk factors for hepatitis C. Sexually transmitted infections (STIs)  You should be screened for sexually transmitted infections (STIs) including gonorrhea and chlamydia if:  You are sexually active and are younger than 62 years of age.  You are older than 62 years of age and your health care provider tells you that you are at risk for this type of infection.  Your sexual activity has changed since you were last screened and you are at an increased risk for chlamydia or gonorrhea. Ask your health care provider if you are at risk.  If you do not have HIV, but are at risk, it may be recommended that you take a prescription medicine daily to prevent HIV infection. This is called pre-exposure prophylaxis (PrEP). You are considered at risk if:  You are sexually active and do not regularly use condoms or know the HIV status of your partner(s).  You take drugs by injection.  You are sexually active with a partner who has HIV. Talk with your health care provider about whether you are at high risk of being infected with HIV. If you choose to begin PrEP, you should first be tested for HIV. You should then be tested every 3 months for as long as you are taking  PrEP.  PREGNANCY   If you are premenopausal and you may become pregnant, ask your health care provider about preconception counseling.  If you may become pregnant, take 400 to 800 micrograms (mcg) of folic acid every day.  If you want to prevent pregnancy, talk to your health care provider about birth control (contraception). OSTEOPOROSIS AND MENOPAUSE   Osteoporosis is a disease in which the bones lose minerals and strength with aging. This can result in serious bone fractures. Your risk for osteoporosis can be identified using a bone density scan.  If you are 78 years of age or older, or if you are at risk for osteoporosis and fractures, ask your health care provider if you should be screened.  Ask your health care provider whether you should take a calcium or vitamin D supplement to lower your risk for osteoporosis.  Menopause may have certain physical symptoms and risks.  Hormone replacement therapy may reduce some of these symptoms and risks. Talk to your health care provider about whether hormone replacement therapy is right for you.  HOME CARE INSTRUCTIONS   Schedule regular health, dental, and eye exams.  Stay current with your immunizations.   Do not use any tobacco products including cigarettes, chewing tobacco, or electronic cigarettes.  If you are pregnant, do not drink alcohol.  If you are breastfeeding, limit how much and how often you drink alcohol.  Limit alcohol intake to no more than 1 drink per day for nonpregnant women. One drink equals 12 ounces of beer, 5 ounces of wine, or 1 ounces of hard liquor.  Do not use street drugs.  Do not share needles.  Ask your health care provider for help if you need support or information about quitting drugs.  Tell your health care provider if you often feel depressed.  Tell your health care provider if you have ever  been abused or do not feel safe at home.   This information is not intended to replace advice given  to you by your health care provider. Make sure you discuss any questions you have with your health care provider.   Document Released: 11/28/2010 Document Revised: 06/05/2014 Document Reviewed: 04/16/2013 [employer]vier Interactive Patient Education [employer].

## 2015-06-28 NOTE — Progress Notes (Signed)
Patient ID: Allison Sharp, female   DOB: 1956/03/19, 60 y.o.   MRN: [id number]   Subjective:   Allison Sharp is a 60 y.o. white, female who presents for an Initial Medicare Annual Wellness Visit. Allison Sharp lives in [place], [place].  Her niece and great nephew live with her.  She is very pleasant and is in NAD today.   Review of Systems  Review of Systems  Constitutional: Negative.   HENT: Negative.   Eyes: Negative.   Respiratory: Negative.   Cardiovascular: Positive for leg swelling.  Gastrointestinal: Negative.   Genitourinary: Positive for frequency.  Musculoskeletal: Positive for back pain.  Skin: Negative.   Neurological: Negative.   Endo/Heme/Allergies: Positive for polydipsia.  Psychiatric/Behavioral: Positive for depression.    Current Medications (verified) Outpatient Encounter Prescriptions as of 06/28/2015  Medication Sig  . acetaminophen (TYLENOL) 500 MG tablet Take 1,000 mg by mouth every 6 (six) hours as needed for mild pain.  Marland Kitchen albuterol (PROVENTIL HFA;VENTOLIN HFA) 108 (90 BASE) MCG/ACT inhaler Inhale 2 puffs into the lungs every 4 (four) hours as needed for wheezing or shortness of breath.  Marland Kitchen albuterol (PROVENTIL) (2.5 MG/3ML) 0.083% nebulizer solution Take 3 mLs (2.5 mg total) by nebulization every 4 (four) hours as needed for wheezing or shortness of breath.  . cetirizine (ZYRTEC) 10 MG tablet Take 1 tablet (10 mg total) by mouth daily.  . cloNIDine (CATAPRES) 0.1 MG tablet Take 1 tablet (0.1 mg total) by mouth at bedtime.  . Clotrimazole 1 % OINT Apply 1 application topically 2 (two) times daily as needed (SKIN RASHES).  Marland Kitchen diclofenac sodium (VOLTAREN) 1 % GEL Apply to affected area twice daily as needed  . divalproex (DEPAKOTE) 500 MG DR tablet Take 1 tablet (500 mg total) by mouth 2 (two) times daily. (Patient taking differently: Take 1,000 mg by mouth at bedtime. )  . doxepin (SINEQUAN) 25 MG capsule Take 1 capsule by mouth daily.  . DULoxetine (CYMBALTA)  60 MG capsule Take 1 capsule (60 mg total) by mouth daily.  . fluticasone (FLONASE) 50 MCG/ACT nasal spray Place 1 spray into both nostrils 2 (two) times daily as needed for allergies or rhinitis.  Marland Kitchen levothyroxine (SYNTHROID, LEVOTHROID) 112 MCG tablet Take 1 tablet (112 mcg total) by mouth daily before breakfast. For hypothyroidism.  Marland Kitchen lisinopril-hydrochlorothiazide (PRINZIDE,ZESTORETIC) 20-25 MG tablet Take 1 tablet by mouth daily.  . Multiple Vitamin (MULTIVITAMIN WITH MINERALS) TABS Take 1 tablet by mouth daily. For nutritional supplementation.  Marland Kitchen omeprazole (PRILOSEC) 20 MG capsule Take 1 capsule (20 mg total) by mouth daily.  . risperiDONE (RISPERDAL) 1 MG tablet Take 1 tablet (1 mg total) by mouth at bedtime.  . [DISCONTINUED] azithromycin (ZITHROMAX) 250 MG tablet Take 2 the first day and then one each day after. (Patient not taking: Reported on 05/23/2015)   No facility-administered encounter medications on file as of 06/28/2015.    Allergies (verified) Haldol; Ativan; and Naproxen   History: Past Medical History  Diagnosis Date  . Hyperlipidemia   . CAD (coronary artery disease)   . Bipolar 1 disorder ([place])   . GERD (gastroesophageal reflux disease)   . Hyperthyroidism   . Chronic back pain   . Dyspnea     PFT 03/05/09 FEV1 2.77(98%), FVC 3.25(86%), FEV1% 85, TLC 5.88(99%), DLCO 60% ,  Methacholine challenge 03/16/09 normal ,  CT chest 03/12/09 no pulmonary disease  . Anxiety   . Arthritis   . Depression   . OSA on CPAP  2 liters  . HTN (hypertension)   . History of colonoscopy 10/17/2002    by Dr Rehman-> distal non-specific proctitis, small ext hemorrhoids,   . Fungal infection   . Cellulitis   . Contusion of sacrum   . Migraine headache   . Vitamin D deficiency   . Sleep apnea   . Myocardial infarction ([place])     NOV 1997  . Hypothyroidism     States she only has hyperthyroidism  . Complication of anesthesia     States she typically gets sick s/p anesthesia    . Morbid obesity with body mass index of 50.0-59.9 in adult [facility]) JAN 2011 370 LBS    2004 311 BMI 45.9  . PTSD (post-traumatic stress disorder)   . Asthma   . Allergy   . Urine incontinence   . Chronic headaches   . Suicidal ideation   . COPD (chronic obstructive pulmonary disease) ([place])   . CHF (congestive heart failure) ([facility])     diastolic dysfunction   Past Surgical History  Procedure Laterality Date  . Cholecystectomy    . Knee arthroscopy    . Appendectomy    . Tonsillectomy    . Back surgery  2008  . Total vaginal hysterectomy    . Abdominal hysterectomy      sept 1996  . Tubal ligation    . Colonoscopy  10/17/2002     Distal proctitis, small external hemorrhoids, otherwise/  normal colonoscopy. Suspect rectal bleeding secondary to hemorrhoids  . Esophagogastroduodenoscopy  03/18/09    fundic gland polyps/mild gastritis  . Cardiac catheterization      nov 1997  . Joint replacement      bil knee replacement  . Hernia repair  1978   Family History  Problem Relation Age of Onset  . Hypertension Mother   . Bipolar disorder Mother   . Dementia Mother   . Depression Mother   . Heart attack Mother   . AAA (abdominal aortic aneurysm) Mother   . Coronary artery disease Father   . Alcohol abuse Father   . Hypertension Brother   . Coronary artery disease Brother   . Bipolar disorder Brother   . Depression Brother   . Anesthesia problems Neg Hx   . Hypotension Neg Hx   . Malignant hyperthermia Neg Hx   . Pseudochol deficiency Neg Hx   . Depression Sister   . Paranoid behavior Sister   . Cancer Sister     breast  . Bipolar disorder Sister   . Depression Sister   . Hypertension Sister   . Cancer Son     thyroid  . Cancer Maternal Aunt     breast metastatized to brain   Social History   Occupational History  . Disabled     back problems   Social History Main Topics  . Smoking status: Never Smoker   . Smokeless tobacco: Never Used  . Alcohol Use: No   . Drug Use: No  . Sexual Activity: No    Do you feel safe at home?  Yes  Dietary issues and exercise activities: Current Exercise Habits:: Exercise is limited by, Limited by:: cardiac condition(s);respiratory conditions(s)  Current Dietary habits:  Patient reports that she eats lots of vegetables.  She likes fish but not chicken.   She also eats whole grain bread.   Objective:    Today's Vitals   06/28/15 0908  BP: 110/62  Pulse: 68  Height: 5\' 9"  (1.753 m)  Weight:  354 lb (160.573 kg)  PainSc: 4   PainLoc: Back   Body mass index is 52.25 kg/(m^2).  Activities of Daily Living In your present state of health, do you have any difficulty performing the following activities: 06/28/2015 01/17/2015  Hearing? Y N  Vision? N N  Difficulty concentrating or making decisions? N Y  Walking or climbing stairs? Y - uses cane Y  Dressing or bathing? N N  Doing errands, shopping? Y N  Preparing Food and eating ? N -  Using the Toilet? N -  In the past six months, have you accidently leaked urine? N -  Do you have problems with loss of bowel control? N -  Managing your Medications? Y -  Managing your Finances? N -  Housekeeping or managing your Housekeeping? N -    Are there smokers in your home (other than you)? No but she spends a lot of time with her sister who smokes   Cardiac Risk Factors include: advanced age (>37men, >17 women);family history of premature cardiovascular disease;hypertension;obesity (BMI >30kg/m2);sedentary lifestyle  Depression Screen PHQ 2/9 Scores 06/28/2015 06/28/2015 04/23/2015 03/05/2015  PHQ - 2 Score 2 0 0 0  PHQ- 9 Score 4 - - -    Fall Risk Fall Risk  06/28/2015 03/05/2015 03/01/2015  Falls in the past year? No No No  Risk for fall due to : Impaired mobility;Impaired balance/gait - -    Cognitive Function: MMSE - Mini Mental State Exam 06/28/2015  Orientation to time 5  Orientation to Place 5  Registration 3  Attention/ Calculation 5  Recall 2   Language- name 2 objects 2  Language- repeat 1  Language- follow 3 step command 3  Language- read & follow direction 1  Write a sentence 1  Copy design 1  Total score 29    Immunizations and Health Maintenance Immunization History  Administered Date(s) Administered  . Influenza Split 04/09/2013  . Influenza,inj,Quad PF,36+ Mos 03/03/2014, 03/01/2015, 03/01/2015  . Pneumococcal Conjugate-13 05/30/2011  . Tdap 09/30/2012   There are no preventive care reminders to display for this patient.  Patient Care Team: Worthy Rancher, MD as PCP - General (Family Medicine) Kathlee Nations, MD (Psychiatry) Danie Binder, MD (Gastroenterology) Fay Records, MD as Consulting Physician (Cardiology)  Indicate any recent Medical Services you may have received from other than [facility] providers in the past year (date may be approximate).    Assessment:    Annual Wellness Visit  Pre diabetes Obesity  Screening Tests Health Maintenance  Topic Date Due  . ZOSTAVAX  02/29/2016 (Originally 02/17/2014)  . Hepatitis C Screening  02/29/2016 (Originally 1955-06-02)  . HIV Screening  02/29/2016 (Originally 02/17/1969)  . INFLUENZA VACCINE  12/28/2015  . PAP SMEAR  07/01/2016  . MAMMOGRAM  02/04/2017  . COLONOSCOPY  05/29/2017  . TETANUS/TDAP  10/01/2022        Plan:   During the course of the visit Saryah was educated and counseled about the following appropriate screening and preventive services:   Vaccines to include Pneumoccal, Influenza, Hepatitis B, Td, Zostavax - patient is due Zostavax.  Cost checked today was $8.25.  She wants to get Friday after she is paid - appt given for 07/02/15 at 10am  Colorectal cancer screening - UTD on colonoscopy and FOBT  Cardiovascular disease screening - last ECHO 2014; Last EKG 04/2015  Diabetes screening - checked FBG today and was 118  Bone Denisty / Osteoporosis Screening (patient over weight limit of office DEXA)  Mammogram - UTD  PAP - UTD    Nutrition counseling  - discussed healthy food choices and limiting portion sizes  Discussed weight loss medications - patient was given information about Saxanda to consider.  Patient to review and let me know if she wants to try.  Insurance coverage might be an issue.  Advanced Directives - Information provided  Physical Activity - patient was given handout about Chair Exercises.  Will all refer patient to physical therapy for balance and strength training.  Goal to decrease weight 7%  Patient will return to clinic 07/02/15 for labs below  Orders Placed This Encounter  Procedures  . Lipid panel    Standing Status: Future     Number of Occurrences:      Standing Expiration Date: 07/05/2015  . Hepatitis C antibody    Standing Status: Future     Number of Occurrences:      Standing Expiration Date: 07/05/2015  . HIV antibody    Standing Status: Future     Number of Occurrences:      Standing Expiration Date: 07/05/2015  . POCT glycosylated hemoglobin (Hb A1C)    Standing Status: Future     Number of Occurrences:      Standing Expiration Date: 07/05/2015     Patient Instructions (the written plan) were given to the patient.   Cherre Robins, [facility]   06/28/2015

## 2015-06-29 MED ORDER — DIVALPROEX SODIUM 500 MG PO DR TAB
1000.0000 mg | DELAYED_RELEASE_TABLET | Freq: Every day | ORAL | Status: DC
Start: 2015-06-29 — End: 2015-07-20

## 2015-06-29 NOTE — Telephone Encounter (Signed)
I sent prescription to pharmacy, called patient to let her know, she did not have voicemail set up.

## 2015-07-02 ENCOUNTER — Other Ambulatory Visit: Payer: Medicare Other

## 2015-07-02 ENCOUNTER — Ambulatory Visit (INDEPENDENT_AMBULATORY_CARE_PROVIDER_SITE_OTHER): Payer: Medicare Other | Admitting: *Deleted

## 2015-07-02 DIAGNOSIS — Z114 Encounter for screening for human immunodeficiency virus [HIV]: Secondary | ICD-10-CM | POA: Diagnosis not present

## 2015-07-02 DIAGNOSIS — R7303 Prediabetes: Secondary | ICD-10-CM

## 2015-07-02 DIAGNOSIS — Z1159 Encounter for screening for other viral diseases: Secondary | ICD-10-CM

## 2015-07-02 DIAGNOSIS — Z23 Encounter for immunization: Secondary | ICD-10-CM

## 2015-07-02 LAB — POCT GLYCOSYLATED HEMOGLOBIN (HGB A1C): Hemoglobin A1C: 5.6

## 2015-07-02 NOTE — Progress Notes (Signed)
Pt given Zostavax injection SubQ left upper arm.

## 2015-07-03 LAB — LIPID PANEL
CHOL/HDL RATIO: 6.1 ratio — AB (ref 0.0–4.4)
Cholesterol, Total: 236 mg/dL — ABNORMAL HIGH (ref 100–199)
HDL: 39 mg/dL — ABNORMAL LOW (ref >39)
LDL Calculated: 161 mg/dL — ABNORMAL HIGH (ref 0–99)
Triglycerides: 181 mg/dL — ABNORMAL HIGH (ref 0–149)
VLDL CHOLESTEROL CAL: 36 mg/dL (ref 5–40)

## 2015-07-03 LAB — HIV ANTIBODY (ROUTINE TESTING W REFLEX): HIV SCREEN 4TH GENERATION: NONREACTIVE

## 2015-07-03 LAB — HEPATITIS C ANTIBODY

## 2015-07-05 ENCOUNTER — Telehealth: Payer: Self-pay | Admitting: Family Medicine

## 2015-07-05 ENCOUNTER — Ambulatory Visit (INDEPENDENT_AMBULATORY_CARE_PROVIDER_SITE_OTHER): Payer: Medicare Other | Admitting: Family Medicine

## 2015-07-05 ENCOUNTER — Encounter: Payer: Self-pay | Admitting: Family Medicine

## 2015-07-05 DIAGNOSIS — E785 Hyperlipidemia, unspecified: Secondary | ICD-10-CM | POA: Insufficient documentation

## 2015-07-05 DIAGNOSIS — R079 Chest pain, unspecified: Secondary | ICD-10-CM | POA: Diagnosis not present

## 2015-07-05 DIAGNOSIS — M1712 Unilateral primary osteoarthritis, left knee: Secondary | ICD-10-CM

## 2015-07-05 DIAGNOSIS — M25511 Pain in right shoulder: Secondary | ICD-10-CM

## 2015-07-05 DIAGNOSIS — M1711 Unilateral primary osteoarthritis, right knee: Secondary | ICD-10-CM | POA: Diagnosis not present

## 2015-07-05 MED ORDER — LIRAGLUTIDE 18 MG/3ML ~~LOC~~ SOPN: PEN_INJECTOR | SUBCUTANEOUS | Status: DC

## 2015-07-05 MED ORDER — ATORVASTATIN CALCIUM 20 MG PO TABS: 20.0000 mg | ORAL_TABLET | Freq: Every day | ORAL | Status: DC

## 2015-07-05 NOTE — Telephone Encounter (Signed)
Thanks for letting us know. 

## 2015-07-05 NOTE — Progress Notes (Signed)
BP 113/79 mmHg  Pulse 86  Temp(Src) 98 F (36.7 C) (Oral)  Ht 5\' 9"  (1.753 m)  Wt 362 lb 6.4 oz (164.384 kg)  BMI 53.49 kg/m2  LMP 02/18/1995   Subjective:    Patient ID: Allison Sharp, female    DOB: 06-Dec-1955, 60 y.o.   MRN: [id number]  HPI: Allison Sharp is a 60 y.o. female presenting on 07/05/2015 for Weight Loss; Shoulder Pain; and Shortness of Breath   HPI Chest pain and tightness Patient has been having chest pain and chest tightness that is increased over the past couple weeks with shortness of breath. she is having increased shortness of breath on exertion and cannot walk as far do as much as she previously could. She has had stress testing in the past but it is been quite a few years. She is obese and has hyperlipidemia as risk factors.  Arthritis Patient has been diagnosed previously with osteoarthritis of both knees and over the past couple years the pain has been increasing and causing more issues. She has pain and effusion in that knee off and on over the past couple years. She does take some Tylenol or Aleve intermittently for the pain. She's also had Voltaren gel which helped some. She denies any overlying skin changes or warmth. The right knee is often worse than left  Right shoulder pain Patient believes that in the past she was diagnosed with rotator cuff issues in the past. She was offered surgery declined and would like to go back to orthopedic and see about possibilities at this current time.   Relevant past medical, surgical, family and social history reviewed and updated as indicated. Interim medical history since our last visit reviewed. Allergies and medications reviewed and updated.  Review of Systems  Constitutional: Negative for fever and chills.  HENT: Negative for congestion, ear discharge and ear pain.   Eyes: Negative for redness and visual disturbance.  Respiratory: Positive for chest tightness and shortness of breath (on exertion). Negative  for cough.   Cardiovascular: Positive for chest pain. Negative for palpitations and leg swelling.  Genitourinary: Negative for dysuria and difficulty urinating.  Musculoskeletal: Positive for myalgias, joint swelling and arthralgias. Negative for back pain and gait problem.  Skin: Negative for rash.  Neurological: Negative for light-headedness and headaches.  Psychiatric/Behavioral: Negative for behavioral problems and agitation.  All other systems reviewed and are negative.   Per HPI unless specifically indicated above     Medication List       This list is accurate as of: 07/05/15  9:14 AM.  Always use your most recent med list.               acetaminophen 500 MG tablet  Commonly known as:  TYLENOL  Take 1,000 mg by mouth every 6 (six) hours as needed for mild pain.     albuterol 108 (90 Base) MCG/ACT inhaler  Commonly known as:  PROVENTIL HFA;VENTOLIN HFA  Inhale 2 puffs into the lungs every 4 (four) hours as needed for wheezing or shortness of breath.     albuterol (2.5 MG/3ML) 0.083% nebulizer solution  Commonly known as:  PROVENTIL  Take 3 mLs (2.5 mg total) by nebulization every 4 (four) hours as needed for wheezing or shortness of breath.     atorvastatin 20 MG tablet  Commonly known as:  LIPITOR  Take 1 tablet (20 mg total) by mouth daily.     cetirizine 10 MG tablet  Commonly known as:  ZYRTEC  Take 1 tablet (10 mg total) by mouth daily.     cloNIDine 0.1 MG tablet  Commonly known as:  CATAPRES  Take 1 tablet (0.1 mg total) by mouth at bedtime.     Clotrimazole 1 % Oint  Apply 1 application topically 2 (two) times daily as needed (SKIN RASHES).     diclofenac sodium 1 % Gel  Commonly known as:  VOLTAREN  Apply to affected area twice daily as needed     divalproex 500 MG DR tablet  Commonly known as:  DEPAKOTE  Take 2 tablets (1,000 mg total) by mouth at bedtime.     doxepin 25 MG capsule  Commonly known as:  SINEQUAN  Take 1 capsule by mouth daily.       DULoxetine 60 MG capsule  Commonly known as:  CYMBALTA  Take 1 capsule (60 mg total) by mouth daily.     fluticasone 50 MCG/ACT nasal spray  Commonly known as:  FLONASE  Place 1 spray into both nostrils 2 (two) times daily as needed for allergies or rhinitis.     levothyroxine 112 MCG tablet  Commonly known as:  SYNTHROID, LEVOTHROID  Take 1 tablet (112 mcg total) by mouth daily before breakfast. For hypothyroidism.     Liraglutide 18 MG/3ML Sopn  Start with 0.6 mg daily 1st week, then increase by 0.6 mg every week until get up to 3 mg once daily.     lisinopril-hydrochlorothiazide 20-25 MG tablet  Commonly known as:  PRINZIDE,ZESTORETIC  Take 1 tablet by mouth daily.     multivitamin with minerals Tabs tablet  Take 1 tablet by mouth daily. For nutritional supplementation.     omeprazole 20 MG capsule  Commonly known as:  PRILOSEC  Take 1 capsule (20 mg total) by mouth daily.     risperiDONE 1 MG tablet  Commonly known as:  RISPERDAL  Take 1 tablet (1 mg total) by mouth at bedtime.           Objective:    BP 113/79 mmHg  Pulse 86  Temp(Src) 98 F (36.7 C) (Oral)  Ht 5\' 9"  (1.753 m)  Wt 362 lb 6.4 oz (164.384 kg)  BMI 53.49 kg/m2  LMP 02/18/1995  Wt Readings from Last 3 Encounters:  07/05/15 362 lb 6.4 oz (164.384 kg)  06/28/15 354 lb (160.573 kg)  06/04/15 354 lb (160.573 kg)    Physical Exam  Constitutional: She is oriented to person, place, and time. She appears well-developed and well-nourished. No distress.  Eyes: Conjunctivae and EOM are normal. Pupils are equal, round, and reactive to light.  Neck: Neck supple. No thyromegaly present.  Cardiovascular: Normal rate, regular rhythm, normal heart sounds and intact distal pulses.   No murmur heard. Pulmonary/Chest: Effort normal and breath sounds normal. No respiratory distress. She has no wheezes. She has no rales. She exhibits no tenderness.  Musculoskeletal: Normal range of motion. She exhibits no  edema or tenderness.       Right shoulder: She exhibits normal range of motion, no tenderness, no bony tenderness, no swelling, no effusion and no deformity.  Positive Hawkins impingement and pain increased with external rotation and overhead movement. No weakness noted.   Knee pain: Bilateral knee pain anteriorly and laterally along the joint lines. No joint laxity is noted. Slight effusion is noted as well. No overlying erythema or skin changes or warmth.  Lymphadenopathy:    She has no cervical adenopathy.  Neurological: She is alert and oriented to  person, place, and time. Coordination normal.  Skin: Skin is warm and dry. No rash noted. She is not diaphoretic.  Psychiatric: She has a normal mood and affect. Her behavior is normal.  Nursing note and vitals reviewed.   Results for orders placed or performed in visit on 07/02/15  Lipid panel  Result Value Ref Range   Cholesterol, Total 236 (H) 100 - 199 mg/dL   Triglycerides 181 (H) 0 - 149 mg/dL   HDL 39 (L) >39 mg/dL   VLDL Cholesterol Cal 36 5 - 40 mg/dL   LDL Calculated 161 (H) 0 - 99 mg/dL   Chol/HDL Ratio 6.1 (H) 0.0 - 4.4 ratio units  Hepatitis C antibody  Result Value Ref Range   Hep C Virus Ab <0.1 0.0 - 0.9 s/co ratio  HIV antibody  Result Value Ref Range   HIV Screen 4th Generation wRfx Non Reactive Non Reactive  POCT glycosylated hemoglobin (Hb A1C)  Result Value Ref Range   Hemoglobin A1C 5.6    EKG: Normal sinus rhythm with signs of left hemifascicular block, was present previously, no changes from previous, based on symptoms recommended to see cardiology.    Assessment & Plan:       Problem List Items Addressed This Visit      Other   Severe obesity (BMI >= 40) ([facility]) - Primary   Relevant Medications   Liraglutide 18 MG/3ML SOPN   Hyperlipidemia LDL goal <130   Relevant Medications   atorvastatin (LIPITOR) 20 MG tablet    Other Visit Diagnoses    Chest pain, unspecified chest pain type        Chest  pain yesterday and chest tightness and shortness of breath is been increased over the past couple weeks, sent back to cardiology for stress testing    Relevant Orders    EKG 12-Lead (Completed)    Primary osteoarthritis of left knee        Relevant Orders    Ambulatory referral to Physical Therapy    Primary osteoarthritis of right knee        Relevant Orders    Ambulatory referral to Physical Therapy    Right shoulder pain        Concern for chronic rotator cuff syndrome, send to orthopedics    Relevant Orders    Ambulatory referral to Orthopedic Surgery    Ambulatory referral to Physical Therapy       Follow up plan: Return in about 3 months (around 10/02/2015), or if symptoms worsen or fail to improve.  Counseling provided for all of the vaccine components Orders Placed This Encounter  Procedures  . Ambulatory referral to Orthopedic Surgery  . Ambulatory referral to Physical Therapy  . EKG 12-Lead    [name], MD [facility] 07/05/2015, 9:14 AM

## 2015-07-05 NOTE — Telephone Encounter (Signed)
Spoke with stew.

## 2015-07-06 ENCOUNTER — Ambulatory Visit (HOSPITAL_COMMUNITY): Payer: Self-pay | Admitting: Psychiatry

## 2015-07-07 ENCOUNTER — Telehealth: Payer: Self-pay | Admitting: Family Medicine

## 2015-07-08 ENCOUNTER — Ambulatory Visit: Payer: Self-pay | Admitting: Physical Therapy

## 2015-07-08 ENCOUNTER — Encounter: Payer: Self-pay | Admitting: Family Medicine

## 2015-07-08 ENCOUNTER — Other Ambulatory Visit (HOSPITAL_COMMUNITY): Payer: Self-pay

## 2015-07-08 DIAGNOSIS — F3160 Bipolar disorder, current episode mixed, unspecified: Secondary | ICD-10-CM

## 2015-07-08 NOTE — Telephone Encounter (Signed)
Telephone call with patient to follow up on a message she left she had to reschedule appointment from 07/13/15 to 07/20/15 but was in need of a refill of Risperdal and Cymbalta as does not have enough to make it until appointment.  Agreed to send request to Dr. Adele Schilder and to follow up with her upon discussion.  Agreed to call patient back at [phone] once discussed.

## 2015-07-09 MED ORDER — RISPERIDONE 1 MG PO TABS: 1.0000 mg | ORAL_TABLET | Freq: Every day | ORAL | Status: DC

## 2015-07-09 MED ORDER — DULOXETINE HCL 60 MG PO CPEP: 60.0000 mg | ORAL_CAPSULE | Freq: Every day | ORAL | Status: DC

## 2015-07-09 NOTE — Telephone Encounter (Signed)
I spoke with Dr. Adele Schilder and he gave verbal authorization to refill the 2 medications, Cymbalta and Risperadol for 1 month. I sent these to the pharmacy via escribe and called the patient to let her know that this had been done.

## 2015-07-13 ENCOUNTER — Ambulatory Visit (HOSPITAL_COMMUNITY): Payer: Self-pay | Admitting: Psychiatry

## 2015-07-14 ENCOUNTER — Ambulatory Visit (HOSPITAL_COMMUNITY): Payer: Self-pay | Admitting: Clinical

## 2015-07-14 NOTE — Telephone Encounter (Signed)
Patient aware of results on 2/9 by TBE

## 2015-07-19 ENCOUNTER — Ambulatory Visit: Payer: Self-pay | Admitting: Physical Therapy

## 2015-07-19 ENCOUNTER — Ambulatory Visit (INDEPENDENT_AMBULATORY_CARE_PROVIDER_SITE_OTHER): Payer: Medicare Other | Admitting: Pharmacist

## 2015-07-19 DIAGNOSIS — R7303 Prediabetes: Secondary | ICD-10-CM | POA: Diagnosis not present

## 2015-07-19 NOTE — Patient Instructions (Signed)
Weight Watchers - Wednesdays 5pm Continue Kirke Shaggy Continue with keeping food journal  Try to start physical activity - walking

## 2015-07-19 NOTE — Progress Notes (Signed)
Patient ID: ODIA TOLLIVER, female   DOB: [birth year], 60 y.o.   MRN: [id number]   Subjective:   BRITTNEYANN DELAPUENTE is a 60 y.o. white, female who presents for reassessment of obesity / weight management. Ms. Hommel lives in [place], [place].  Her niece and great nephew live with her.  She is very pleasant and is in NAD today.  Mrs. Balland started [name] for weight loss about 2 weeks ago.  She reports decreased appetite.  She did have nausea and some diarrhea yesterday but that has resolved today.   Diet - eating more vegetables and fruits.  She states she is surprised that she likes as many vegetables as she does such as peppers and zucchini.  She is limiting her sweets and processed foods.  She has also bought a small children's plate that she uses to control portions.   Exercise - has not started exercise yet.  She has appt with PT today but she did not feel like going due to diarrhea yesterday.  She is going to reschedule ASAP  Review of Systems  Review of Systems  Constitutional: Positive for weight loss.  Gastrointestinal: Positive for nausea and diarrhea.    Current Medications (verified) Outpatient Encounter Prescriptions as of 07/19/2015  Medication Sig  . acetaminophen (TYLENOL) 500 MG tablet Take 1,000 mg by mouth every 6 (six) hours as needed for mild pain.  Marland Kitchen albuterol (PROVENTIL HFA;VENTOLIN HFA) 108 (90 BASE) MCG/ACT inhaler Inhale 2 puffs into the lungs every 4 (four) hours as needed for wheezing or shortness of breath.  Marland Kitchen albuterol (PROVENTIL) (2.5 MG/3ML) 0.083% nebulizer solution Take 3 mLs (2.5 mg total) by nebulization every 4 (four) hours as needed for wheezing or shortness of breath.  Marland Kitchen atorvastatin (LIPITOR) 20 MG tablet Take 1 tablet (20 mg total) by mouth daily.  . cetirizine (ZYRTEC) 10 MG tablet Take 1 tablet (10 mg total) by mouth daily.  . cloNIDine (CATAPRES) 0.1 MG tablet Take 1 tablet (0.1 mg total) by mouth at bedtime.  . Clotrimazole 1 % OINT Apply 1  application topically 2 (two) times daily as needed (SKIN RASHES).  Marland Kitchen diclofenac sodium (VOLTAREN) 1 % GEL Apply to affected area twice daily as needed  . divalproex (DEPAKOTE) 500 MG DR tablet Take 2 tablets (1,000 mg total) by mouth at bedtime.  Marland Kitchen doxepin (SINEQUAN) 25 MG capsule Take 1 capsule by mouth daily.  . DULoxetine (CYMBALTA) 60 MG capsule Take 1 capsule (60 mg total) by mouth daily.  . fluticasone (FLONASE) 50 MCG/ACT nasal spray Place 1 spray into both nostrils 2 (two) times daily as needed for allergies or rhinitis.  Marland Kitchen levothyroxine (SYNTHROID, LEVOTHROID) 112 MCG tablet Take 1 tablet (112 mcg total) by mouth daily before breakfast. For hypothyroidism.  . Liraglutide 18 MG/3ML SOPN Start with 0.6 mg daily 1st week, then increase by 0.6 mg every week until get up to 3 mg once daily.  Marland Kitchen lisinopril-hydrochlorothiazide (PRINZIDE,ZESTORETIC) 20-25 MG tablet Take 1 tablet by mouth daily.  . Multiple Vitamin (MULTIVITAMIN WITH MINERALS) TABS Take 1 tablet by mouth daily. For nutritional supplementation.  Marland Kitchen omeprazole (PRILOSEC) 20 MG capsule Take 1 capsule (20 mg total) by mouth daily.  . risperiDONE (RISPERDAL) 1 MG tablet Take 1 tablet (1 mg total) by mouth at bedtime.   No facility-administered encounter medications on file as of 07/19/2015.    Allergies (verified) Haldol; Ativan; and Naproxen   Objective:    Today's Vitals   07/19/15 1527  BP: 118/78  Pulse: 70  Height: 5\' 9"  (1.753 m)  Weight: 361 lb 9.6 oz (164.021 kg)   Body mass index is 53.37 kg/(m^2).   Assessment:    Pre diabetes Obesity      Plan:   1.  Continue Saxenda currently as 1.2mg  daily.  2.  Discussed diet - she is to continue with food journal, continue to limit portion sizes and increase fruits and vegetables.  3.  Increase physical activity - encouraged her to reschedule PT appt and she is also going to ask her sister to start walking with her daily.  4.  Other behavior modifications discussed  such as having niece and nephew place high calorie / junk food out of sight and to move fruits and vegetables and other healthier foods to upper shelves of refridge so they are more in sight than unhealthier choices.  5.  Patient also asked about the local Weight Watchers program - information provided.   Cherre Robins, PharmD, CPP, CDE       Cherre Robins, [facility]   07/19/2015

## 2015-07-20 ENCOUNTER — Ambulatory Visit (INDEPENDENT_AMBULATORY_CARE_PROVIDER_SITE_OTHER): Payer: Medicare Other | Admitting: Psychiatry

## 2015-07-20 ENCOUNTER — Encounter (HOSPITAL_COMMUNITY): Payer: Self-pay | Admitting: Psychiatry

## 2015-07-20 VITALS — BP 121/81 | HR 89 | Ht 69.0 in | Wt 359.4 lb

## 2015-07-20 DIAGNOSIS — F3160 Bipolar disorder, current episode mixed, unspecified: Secondary | ICD-10-CM

## 2015-07-20 MED ORDER — DULOXETINE HCL 60 MG PO CPEP: 60.0000 mg | ORAL_CAPSULE | Freq: Every day | ORAL | Status: DC

## 2015-07-20 MED ORDER — RISPERIDONE 1 MG PO TABS: 1.0000 mg | ORAL_TABLET | Freq: Every day | ORAL | Status: DC

## 2015-07-20 MED ORDER — DOXEPIN HCL 25 MG PO CAPS: 25.0000 mg | ORAL_CAPSULE | Freq: Every day | ORAL | Status: DC

## 2015-07-20 MED ORDER — DIVALPROEX SODIUM 500 MG PO DR TAB: 1000.0000 mg | DELAYED_RELEASE_TABLET | Freq: Every day | ORAL | Status: DC

## 2015-07-20 NOTE — Progress Notes (Signed)
Allison Sharp [id number] Progress Note  Allison Sharp [id number] 60 y.o.  07/20/2015 4:23 PM  Chief Complaint:  I'm doing better but I'm not sleeping well.  I don't think I'm taking sleep medication my niece is giving the money and that is helping .  I'm able to pay my bills.  History of Present Illness:  Allison Sharp came for her follow-up appointment.  She is taking Risperdal, Depakote, Cymbalta .  She supposed to take Sinequan but she has been not taken it for past few weeks.  She admitted poor sleep but otherwise she reported her mood is much better.  She is happy that her niece is giving her money and she is able to pay the bills.  Her irritability, paranoia, mood swings are much better.  She has no tremors, shakes or any EPS.  She started seeing Allison Sharp like to continue counseling.  She denies any hallucination or any crying spells.  She started Victoza and notice weight loss since start taking the medication.  Overall she described things are going well.  She does not want to change her medication but like to get sleep medication restarted.  Her appetite is okay.  Her vitals are stable.  She continues to live with her niece and her 79 year old child.  Patient denies drinking or using any illegal substances.  Suicidal Ideation: No Plan Formed: No Patient has means to carry out plan: No  Homicidal Ideation: No Plan Formed: No Patient has means to carry out plan: No  Past Psychiatric History/Hospitalization(s): Patient has history of psychiatric illness and treatment.  She has at least 4-5 psychiatric hospitalization.  She has history of suicidal attempt in the past by taking overdose on her medication.  She has diagnosed with PTSD, major depressive disorder and bipolar disorder.  In the past she had tried Paxil, Prozac, Wellbutrin, Effexor, Lexapro, amitriptyline, Cymbalta, Neurontin, Depakote, trazodone, Thorazine.  During her last psychiatric hospitalization she was given hydroxyzine,  Luvox, Sinequan, Zyprexa .  Recently she was given [place] however to stop due to poor response.  She was seeing this Probation officer in [place] and then Dr. Gilford Sharp.  She has history of psychosis, mania, severe mood swing and depression. Anxiety: Yes Bipolar Disorder: Yes Depression: Yes Mania: Yes Psychosis: Yes Schizophrenia: No Personality Disorder: No Hospitalization for psychiatric illness: Yes History of Electroconvulsive Shock Therapy: No Prior Suicide Attempts: Yes  Medical History; Patient has multiple Sharp problem.  She has hyperlipidemia, GERD, chronic back pain, arthritis, sleep apnea, hypertension, COPD, obesity, thyroid problem, coronary artery disease, headaches and she had history of knee arthroscopy, tonsillectomy, back surgery, hysterectomy, cholecystectomy, hernia repair, joint replacement and appendectomy.  Family History; Patient sister, nieces, son has psychiatric issues.  Her sister and niece has been seen in this office.  Her son has drug problem.  Review of Systems: Psychiatric: Agitation: Irritability Hallucination: No Depressed Mood: Yes Insomnia: Yes Hypersomnia: No Altered Concentration: No Feels Worthless: No Grandiose Ideas: No Belief In Special Powers: No New/Increased Substance Abuse: No Compulsions: No  Neurologic: Headache: No Seizure: No Paresthesias: No   Outpatient Encounter Prescriptions as of 07/20/2015  Medication Sig  . acetaminophen (TYLENOL) 500 MG tablet Take 1,000 mg by mouth every 6 (six) hours as needed for mild pain.  Marland Kitchen albuterol (PROVENTIL HFA;VENTOLIN HFA) 108 (90 BASE) MCG/ACT inhaler Inhale 2 puffs into the lungs every 4 (four) hours as needed for wheezing or shortness of breath.  Marland Kitchen albuterol (PROVENTIL) (2.5 MG/3ML) 0.083% nebulizer solution Take 3 mLs (2.5 mg  total) by nebulization every 4 (four) hours as needed for wheezing or shortness of breath.  Marland Kitchen atorvastatin (LIPITOR) 20 MG tablet Take 1 tablet (20 mg total) by mouth  daily.  . cetirizine (ZYRTEC) 10 MG tablet Take 1 tablet (10 mg total) by mouth daily.  . cloNIDine (CATAPRES) 0.1 MG tablet Take 1 tablet (0.1 mg total) by mouth at bedtime.  . Clotrimazole 1 % OINT Apply 1 application topically 2 (two) times daily as needed (SKIN RASHES).  Marland Kitchen diclofenac sodium (VOLTAREN) 1 % GEL Apply to affected area twice daily as needed  . divalproex (DEPAKOTE) 500 MG DR tablet Take 2 tablets (1,000 mg total) by mouth at bedtime.  Marland Kitchen doxepin (SINEQUAN) 25 MG capsule Take 1 capsule (25 mg total) by mouth daily.  . DULoxetine (CYMBALTA) 60 MG capsule Take 1 capsule (60 mg total) by mouth daily.  . fluticasone (FLONASE) 50 MCG/ACT nasal spray Place 1 spray into both nostrils 2 (two) times daily as needed for allergies or rhinitis.  Marland Kitchen levothyroxine (SYNTHROID, LEVOTHROID) 112 MCG tablet Take 1 tablet (112 mcg total) by mouth daily before breakfast. For hypothyroidism.  . Liraglutide 18 MG/3ML SOPN Start with 0.6 mg daily 1st week, then increase by 0.6 mg every week until get up to 3 mg once daily.  Marland Kitchen lisinopril-hydrochlorothiazide (PRINZIDE,ZESTORETIC) 20-25 MG tablet Take 1 tablet by mouth daily.  . Multiple Vitamin (MULTIVITAMIN WITH MINERALS) TABS Take 1 tablet by mouth daily. For nutritional supplementation.  Marland Kitchen omeprazole (PRILOSEC) 20 MG capsule Take 1 capsule (20 mg total) by mouth daily.  . risperiDONE (RISPERDAL) 1 MG tablet Take 1 tablet (1 mg total) by mouth at bedtime.  . [DISCONTINUED] divalproex (DEPAKOTE) 500 MG DR tablet Take 2 tablets (1,000 mg total) by mouth at bedtime.  . [DISCONTINUED] doxepin (SINEQUAN) 25 MG capsule Take 1 capsule by mouth daily.  . [DISCONTINUED] DULoxetine (CYMBALTA) 60 MG capsule Take 1 capsule (60 mg total) by mouth daily.  . [DISCONTINUED] risperiDONE (RISPERDAL) 1 MG tablet Take 1 tablet (1 mg total) by mouth at bedtime.   No facility-administered encounter medications on file as of 07/20/2015.    Recent Results (from the past 2160  hour(s))  Rapid strep screen     Status: None   Collection Time: 05/23/15  2:30 PM  Result Value Ref Range   Streptococcus, Group A Screen (Direct) NEGATIVE NEGATIVE    Comment: (NOTE) A Rapid Antigen test may result negative if the antigen level in the sample is below the detection level of this test. The FDA has not cleared this test as a stand-alone test therefore the rapid antigen negative result has reflexed to a Group A Strep culture.   Culture, Group A Strep     Status: None   Collection Time: 05/23/15  2:30 PM  Result Value Ref Range   Strep A Culture Negative     Comment: (NOTE) Performed At: [facility] [address] [place], [place] [id number] Lindon Romp MD FW:[id number]   Troponin I     Status: None   Collection Time: 05/23/15  3:41 PM  Result Value Ref Range   Troponin I <0.03 <0.031 ng/mL    Comment:        NO INDICATION OF MYOCARDIAL INJURY.   CBC with Differential     Status: Abnormal   Collection Time: 05/23/15  3:41 PM  Result Value Ref Range   WBC 6.5 4.0 - 10.5 K/uL   RBC 4.76 3.87 - 5.11 MIL/uL  Hemoglobin 14.6 12.0 - 15.0 g/dL   HCT 42.4 36.0 - 46.0 %   MCV 89.1 78.0 - 100.0 fL   MCH 30.7 26.0 - 34.0 pg   MCHC 34.4 30.0 - 36.0 g/dL   RDW 12.8 11.5 - 15.5 %   Platelets 117 (L) 150 - 400 K/uL    Comment: SPECIMEN CHECKED FOR CLOTS PLATELET COUNT CONFIRMED BY SMEAR    Neutrophils Relative % 40 %   Neutro Abs 2.6 1.7 - 7.7 K/uL   Lymphocytes Relative 42 %   Lymphs Abs 2.7 0.7 - 4.0 K/uL   Monocytes Relative 14 %   Monocytes Absolute 0.9 0.1 - 1.0 K/uL   Eosinophils Relative 3 %   Eosinophils Absolute 0.2 0.0 - 0.7 K/uL   Basophils Relative 1 %   Basophils Absolute 0.0 0.0 - 0.1 K/uL  Brain natriuretic peptide     Status: None   Collection Time: 05/23/15  3:41 PM  Result Value Ref Range   B Natriuretic Peptide 20.0 0.0 - 100.0 pg/mL  Basic metabolic panel     Status: Abnormal   Collection Time: 05/23/15  3:41 PM  Result  Value Ref Range   Sodium 137 135 - 145 mmol/L   Potassium 3.7 3.5 - 5.1 mmol/L   Chloride 100 (L) 101 - 111 mmol/L   CO2 30 22 - 32 mmol/L   Glucose, Bld 105 (H) 65 - 99 mg/dL   BUN 15 6 - 20 mg/dL   Creatinine, Ser 0.92 0.44 - 1.00 mg/dL   Calcium 9.2 8.9 - 10.3 mg/dL   GFR calc non Af Amer >60 >60 mL/min   GFR calc Af Amer >60 >60 mL/min    Comment: (NOTE) The eGFR has been calculated using the CKD EPI equation. This calculation has not been validated in all clinical situations. eGFR's persistently <60 mL/min signify possible Chronic Kidney Disease.    Anion gap 7 5 - 15  Lipid panel     Status: Abnormal   Collection Time: 07/02/15 10:03 AM  Result Value Ref Range   Cholesterol, Total 236 (H) 100 - 199 mg/dL   Triglycerides 181 (H) 0 - 149 mg/dL   HDL 39 (L) >39 mg/dL   VLDL Cholesterol Cal 36 5 - 40 mg/dL   LDL Calculated 161 (H) 0 - 99 mg/dL   Chol/HDL Ratio 6.1 (H) 0.0 - 4.4 ratio units    Comment:                                   T. Chol/HDL Ratio                                             Men  Women                               1/2 Avg.Risk  3.4    3.3                                   Avg.Risk  5.0    4.4  2X Avg.Risk  9.6    7.1                                3X Avg.Risk 23.4   11.0   Hepatitis C antibody     Status: None   Collection Time: 07/02/15 10:03 AM  Result Value Ref Range   Hep C Virus Ab <0.1 0.0 - 0.9 s/co ratio    Comment:                                   Negative:     < 0.8                              Indeterminate: 0.8 - 0.9                                   Positive:     > 0.9  The CDC recommends that a positive HCV antibody result  be followed up with a HCV Nucleic Acid Amplification  test (119147).   HIV antibody     Status: None   Collection Time: 07/02/15 10:03 AM  Result Value Ref Range   HIV Screen 4th Generation wRfx Non Reactive Non Reactive  POCT glycosylated hemoglobin (Hb A1C)     Status: None    Collection Time: 07/02/15 10:23 AM  Result Value Ref Range   Hemoglobin A1C 5.6     Comment: 4.8-5.6      Constitutional:  BP 121/81 mmHg  Pulse 89  Ht '5\' 9"'$  (1.753 m)  Wt 359 lb 6.4 oz (163.023 kg)  BMI 53.05 kg/m2  LMP 02/18/1995   Musculoskeletal: Strength & Muscle Tone: decreased Gait & Station: normal Patient leans: N/A  Psychiatric Specialty Exam: General Appearance: Casual  Eye Contact::  Fair  Speech:  Normal Rate  Volume:  Normal  Mood:  Euthymic  Affect:  Labile  Thought Process:  Coherent  Orientation:  Full (Time, Place, and Person)  Thought Content:  Rumination  Suicidal Thoughts:  No  Homicidal Thoughts:  No  Memory:  Immediate;   Fair Recent;   Fair Remote;   Fair  Judgement:  Intact  Insight:  Fair  Psychomotor Activity:  Normal  Concentration:  Fair  Recall:  [employer] of Knowledge:  Fair  Language:  Fair  Akathisia:  No  Handed:  Right  AIMS (if indicated):     Assets:  Communication Skills Desire for Improvement Housing  ADL's:  Intact  Cognition:  Impaired,  Mild  Sleep:        Established Problem, Stable/Improving (1), Review of Psycho-Social Stressors (1), Review of Last Therapy Session (1) and Review of Medication Regimen & Side Effects (2)  Assessment: Axis I: Bipolar disorder mixed.  Rule out schizoaffective disorder depressed type, PTSD, rule out major depressive disorder recurrent  Axis II: Deferred  Axis III:  Past Medical History  Diagnosis Date  . Hyperlipidemia   . CAD (coronary artery disease)   . Bipolar 1 disorder ([place])   . GERD (gastroesophageal reflux disease)   . Hyperthyroidism   . Chronic back pain   . Dyspnea     PFT 03/05/09 FEV1 2.77(98%), FVC 3.25(86%), FEV1% 85, TLC 5.88(99%), DLCO 60% ,  Methacholine challenge 03/16/09 normal ,  CT chest 03/12/09 no pulmonary disease  . Anxiety   . Arthritis   . Depression   . OSA on CPAP     2 liters  . HTN (hypertension)   . History of colonoscopy 10/17/2002     by Dr Rehman-> distal non-specific proctitis, small ext hemorrhoids,   . Fungal infection   . Cellulitis   . Contusion of sacrum   . Migraine headache   . Vitamin D deficiency   . Sleep apnea   . Myocardial infarction ([place])     NOV 1997  . Hypothyroidism     States she only has hyperthyroidism  . Complication of anesthesia     States she typically gets sick s/p anesthesia  . Morbid obesity with body mass index of 50.0-59.9 in adult [facility]) JAN 2011 370 LBS    2004 311 BMI 45.9  . PTSD (post-traumatic stress disorder)   . Asthma   . Allergy   . Urine incontinence   . Chronic headaches   . Suicidal ideation   . COPD (chronic obstructive pulmonary disease) ([place])   . CHF (congestive heart failure) ([facility])     diastolic dysfunction     Plan:  Patient doing better on her current psychiatric medication however not able to continue Sinequan causing poor sleep.  Recommended to restart doxepin 25 mg at bedtime.  It was helping her sleep.  Discussed medication side effects and benefits.  Continue Depakote thousand milligram at bedtime, Cymbalta 60 mg daily and Risperdal 1 mg at bedtime.  Encouraged to keep appointment with [facility] for counseling.  We will consider Depakote level on her next appointment.  Recommended to call us back if she has any question or concern if she feel worsening of the symptoms.  Follow-up in 2 months.  Tarri Guilfoil T., MD 07/20/2015

## 2015-07-22 DIAGNOSIS — M75101 Unspecified rotator cuff tear or rupture of right shoulder, not specified as traumatic: Secondary | ICD-10-CM | POA: Diagnosis not present

## 2015-07-22 DIAGNOSIS — M25511 Pain in right shoulder: Secondary | ICD-10-CM | POA: Diagnosis not present

## 2015-07-23 ENCOUNTER — Other Ambulatory Visit: Payer: Self-pay | Admitting: Family Medicine

## 2015-07-28 ENCOUNTER — Ambulatory Visit (HOSPITAL_COMMUNITY): Payer: Self-pay | Admitting: Clinical

## 2015-07-29 ENCOUNTER — Telehealth: Payer: Self-pay

## 2015-07-29 ENCOUNTER — Ambulatory Visit: Payer: Medicare Other | Admitting: Physical Therapy

## 2015-07-29 ENCOUNTER — Ambulatory Visit (INDEPENDENT_AMBULATORY_CARE_PROVIDER_SITE_OTHER): Payer: Medicare Other | Admitting: Family Medicine

## 2015-07-29 ENCOUNTER — Encounter: Payer: Self-pay | Admitting: Family Medicine

## 2015-07-29 VITALS — BP 120/78 | HR 87 | Temp 98.0°F | Ht 69.0 in | Wt 356.8 lb

## 2015-07-29 DIAGNOSIS — R21 Rash and other nonspecific skin eruption: Secondary | ICD-10-CM

## 2015-07-29 DIAGNOSIS — T8090XA Unspecified complication following infusion and therapeutic injection, initial encounter: Secondary | ICD-10-CM

## 2015-07-29 DIAGNOSIS — J4531 Mild persistent asthma with (acute) exacerbation: Secondary | ICD-10-CM

## 2015-07-29 DIAGNOSIS — J4541 Moderate persistent asthma with (acute) exacerbation: Secondary | ICD-10-CM

## 2015-07-29 MED ORDER — FLUTICASONE FUROATE-VILANTEROL 100-25 MCG/INH IN AEPB: 1.0000 | INHALATION_SPRAY | Freq: Every day | RESPIRATORY_TRACT | Status: DC

## 2015-07-29 NOTE — Telephone Encounter (Signed)
Okay to go ahead and put in referral to pulmonology

## 2015-07-29 NOTE — Progress Notes (Signed)
BP 120/78 mmHg  Pulse 87  Temp(Src) 98 F (36.7 C) (Oral)  Ht 5\' 9"  (1.753 m)  Wt 356 lb 12.8 oz (161.843 kg)  BMI 52.67 kg/m2  LMP 02/18/1995   Subjective:    Patient ID: Allison Sharp, female    DOB: 1955/06/04, 60 y.o.   MRN: [id number]  HPI: Allison Sharp is a 59 y.o. female presenting on 07/29/2015 for Rash at places where she is giving herself Victoza shots and Shortness of Breath   HPI Asthma Patient has had persistent asthma but is not improving after her last exacerbation. She has continued to have to use her rescue inhalers of both albuterol and the nebulized albuterol at least 2-3 times daily. She denies any nighttime symptoms waking her up. It is mostly daytime symptoms that are affecting her. She denies any fevers or chills. She does have a cough that is nonproductive. She also has some wheezing intermittently. She has been on maintenance inhalers previously but was not on one currently. She is even feeling more short of breath with walking short distances now than what she had been previously.  Injection site reaction Patient was placed on Pitocin for weight loss and on 5 different injection sites she has had a local site reaction and blistered up and then became very itchy and then is now starting to improve on the first sites of earlier in the week. She denies any systemic rashes just a local reaction where she injects it. She's never had a reaction like this to anything else previously.  Relevant past medical, surgical, family and social history reviewed and updated as indicated. Interim medical history since our last visit reviewed. Allergies and medications reviewed and updated.  Review of Systems  Constitutional: Negative for fever and chills.  HENT: Negative for congestion, ear discharge and ear pain.   Eyes: Negative for redness and visual disturbance.  Respiratory: Positive for cough, shortness of breath and wheezing. Negative for chest tightness.     Cardiovascular: Negative for chest pain and leg swelling.  Genitourinary: Negative for dysuria and difficulty urinating.  Musculoskeletal: Negative for back pain and gait problem.  Skin: Positive for color change and rash.  Neurological: Negative for light-headedness and headaches.  Psychiatric/Behavioral: Negative for behavioral problems and agitation.  All other systems reviewed and are negative.   Per HPI unless specifically indicated above     Medication List       This list is accurate as of: 07/29/15  4:36 PM.  Always use your most recent med list.               acetaminophen 500 MG tablet  Commonly known as:  TYLENOL  Take 1,000 mg by mouth every 6 (six) hours as needed for mild pain.     albuterol 108 (90 Base) MCG/ACT inhaler  Commonly known as:  PROVENTIL HFA;VENTOLIN HFA  Inhale 2 puffs into the lungs every 4 (four) hours as needed for wheezing or shortness of breath.     albuterol (2.5 MG/3ML) 0.083% nebulizer solution  Commonly known as:  PROVENTIL  Take 3 mLs (2.5 mg total) by nebulization every 4 (four) hours as needed for wheezing or shortness of breath.     atorvastatin 20 MG tablet  Commonly known as:  LIPITOR  Take 1 tablet (20 mg total) by mouth daily.     cetirizine 10 MG tablet  Commonly known as:  ZYRTEC  Take 1 tablet (10 mg total) by mouth daily.  cloNIDine 0.1 MG tablet  Commonly known as:  CATAPRES  TAKE ONE TABLET AT BEDTIME     Clotrimazole 1 % Oint  Apply 1 application topically 2 (two) times daily as needed (SKIN RASHES).     diclofenac sodium 1 % Gel  Commonly known as:  VOLTAREN  Apply to affected area twice daily as needed     divalproex 500 MG DR tablet  Commonly known as:  DEPAKOTE  Take 2 tablets (1,000 mg total) by mouth at bedtime.     doxepin 25 MG capsule  Commonly known as:  SINEQUAN  Take 1 capsule (25 mg total) by mouth daily.     DULoxetine 60 MG capsule  Commonly known as:  CYMBALTA  Take 1 capsule (60 mg  total) by mouth daily.     fluticasone 50 MCG/ACT nasal spray  Commonly known as:  FLONASE  Place 1 spray into both nostrils 2 (two) times daily as needed for allergies or rhinitis.     fluticasone furoate-vilanterol 100-25 MCG/INH Aepb  Commonly known as:  BREO ELLIPTA  Inhale 1 puff into the lungs daily.     levothyroxine 112 MCG tablet  Commonly known as:  SYNTHROID, LEVOTHROID  Take 1 tablet (112 mcg total) by mouth daily before breakfast. For hypothyroidism.     Liraglutide 18 MG/3ML Sopn  Start with 0.6 mg daily 1st week, then increase by 0.6 mg every week until get up to 3 mg once daily.     lisinopril-hydrochlorothiazide 20-25 MG tablet  Commonly known as:  PRINZIDE,ZESTORETIC  Take 1 tablet by mouth daily.     meloxicam 7.5 MG tablet  Commonly known as:  MOBIC  Take 1 tablet (7.5 mg total) by mouth daily.     multivitamin with minerals Tabs tablet  Take 1 tablet by mouth daily. For nutritional supplementation.     omeprazole 20 MG capsule  Commonly known as:  PRILOSEC  Take 1 capsule (20 mg total) by mouth daily.     risperiDONE 1 MG tablet  Commonly known as:  RISPERDAL  Take 1 tablet (1 mg total) by mouth at bedtime.           Objective:    BP 120/78 mmHg  Pulse 87  Temp(Src) 98 F (36.7 C) (Oral)  Ht 5\' 9"  (1.753 m)  Wt 356 lb 12.8 oz (161.843 kg)  BMI 52.67 kg/m2  LMP 02/18/1995  Wt Readings from Last 3 Encounters:  07/29/15 356 lb 12.8 oz (161.843 kg)  07/20/15 359 lb 6.4 oz (163.023 kg)  07/19/15 361 lb 9.6 oz (164.021 kg)    Physical Exam  Constitutional: She is oriented to person, place, and time. She appears well-developed and well-nourished. No distress.  Eyes: Conjunctivae and EOM are normal. Pupils are equal, round, and reactive to light.  Cardiovascular: Normal rate, regular rhythm, normal heart sounds and intact distal pulses.   No murmur heard. Pulmonary/Chest: Effort normal and breath sounds normal. No respiratory distress. She has  no wheezes.  Musculoskeletal: Normal range of motion. She exhibits no edema or tenderness.  Neurological: She is alert and oriented to person, place, and time. Coordination normal.  Skin: Skin is warm and dry. Rash noted. Rash is maculopapular (Maculopapular rash with excoriations in a 1 cm round area on 5 different sites where she is injected though the toe so. 2 on abdomen one on each arm and one on her anterior right thigh.). She is not diaphoretic.  Psychiatric: She has a normal mood and  affect. Her behavior is normal.  Nursing note and vitals reviewed.     Assessment & Plan:   Problem List Items Addressed This Visit      Respiratory   Asthma with acute exacerbation   Relevant Medications   fluticasone furoate-vilanterol (BREO ELLIPTA) 100-25 MCG/INH AEPB    Other Visit Diagnoses    Injection site reaction, initial encounter    -  Primary    local injection site reaction to victoza, working for weight loss, gave samples for trulicity to see if better, benadryl and hydrocortisone       Follow up plan: Return if symptoms worsen or fail to improve.  Counseling provided for all of the vaccine components No orders of the defined types were placed in this encounter.    [name], MD [facility] 07/29/2015, 4:36 PM

## 2015-07-29 NOTE — Telephone Encounter (Signed)
Patient states you had mentioned to her referring her to a pulmonologist, patient would like for you to go ahead and with this.

## 2015-07-30 ENCOUNTER — Encounter: Payer: Self-pay | Admitting: Physical Therapy

## 2015-07-30 ENCOUNTER — Ambulatory Visit: Payer: Medicare Other | Attending: Orthopedic Surgery | Admitting: Physical Therapy

## 2015-07-30 ENCOUNTER — Ambulatory Visit (HOSPITAL_BASED_OUTPATIENT_CLINIC_OR_DEPARTMENT_OTHER): Payer: Medicare Other | Attending: Internal Medicine | Admitting: *Deleted

## 2015-07-30 VITALS — Ht 69.0 in | Wt >= 6400 oz

## 2015-07-30 DIAGNOSIS — I493 Ventricular premature depolarization: Secondary | ICD-10-CM | POA: Diagnosis not present

## 2015-07-30 DIAGNOSIS — R0683 Snoring: Secondary | ICD-10-CM | POA: Insufficient documentation

## 2015-07-30 DIAGNOSIS — R6889 Other general symptoms and signs: Secondary | ICD-10-CM | POA: Diagnosis not present

## 2015-07-30 DIAGNOSIS — E669 Obesity, unspecified: Secondary | ICD-10-CM | POA: Insufficient documentation

## 2015-07-30 DIAGNOSIS — M25511 Pain in right shoulder: Secondary | ICD-10-CM | POA: Diagnosis not present

## 2015-07-30 DIAGNOSIS — G4733 Obstructive sleep apnea (adult) (pediatric): Secondary | ICD-10-CM | POA: Diagnosis not present

## 2015-07-30 DIAGNOSIS — G4730 Sleep apnea, unspecified: Secondary | ICD-10-CM

## 2015-07-30 DIAGNOSIS — Z9989 Dependence on other enabling machines and devices: Secondary | ICD-10-CM

## 2015-07-30 DIAGNOSIS — R519 Headache, unspecified: Secondary | ICD-10-CM

## 2015-07-30 DIAGNOSIS — Z79899 Other long term (current) drug therapy: Secondary | ICD-10-CM | POA: Insufficient documentation

## 2015-07-30 DIAGNOSIS — R51 Headache: Secondary | ICD-10-CM

## 2015-07-30 DIAGNOSIS — Z6841 Body Mass Index (BMI) 40.0 and over, adult: Secondary | ICD-10-CM | POA: Insufficient documentation

## 2015-07-30 DIAGNOSIS — J449 Chronic obstructive pulmonary disease, unspecified: Secondary | ICD-10-CM | POA: Diagnosis not present

## 2015-07-30 DIAGNOSIS — I158 Other secondary hypertension: Secondary | ICD-10-CM

## 2015-07-30 NOTE — Therapy (Addendum)
[facility] [place], [place], [address] Phone: [phone]   Fax:  [phone]  Physical Therapy Evaluation  Patient Details  Name: Allison Sharp MRN: [id number] Date of Birth: 05-Sep-1955 Referring Provider: Gwyndolyn Saxon L. Altamese Cabal  Encounter Date: 07/30/2015      PT End of Session - 07/30/15 1119    Visit Number 1   Number of Visits 1   PT Start Time 4801   PT Stop Time 1118   PT Time Calculation (min) 40 min   Activity Tolerance Patient tolerated treatment well   Behavior During Therapy WFL for tasks assessed/performed      Past Medical History  Diagnosis Date  . Hyperlipidemia   . CAD (coronary artery disease)   . Bipolar 1 disorder ([place])   . GERD (gastroesophageal reflux disease)   . Hyperthyroidism   . Chronic back pain   . Dyspnea     PFT 03/05/09 FEV1 2.77(98%), FVC 3.25(86%), FEV1% 85, TLC 5.88(99%), DLCO 60% ,  Methacholine challenge 03/16/09 normal ,  CT chest 03/12/09 no pulmonary disease  . Anxiety   . Arthritis   . Depression   . OSA on CPAP     2 liters  . HTN (hypertension)   . History of colonoscopy 10/17/2002    by Dr Rehman-> distal non-specific proctitis, small ext hemorrhoids,   . Fungal infection   . Cellulitis   . Contusion of sacrum   . Migraine headache   . Vitamin D deficiency   . Sleep apnea   . Myocardial infarction ([place])     NOV 1997  . Hypothyroidism     States she only has hyperthyroidism  . Complication of anesthesia     States she typically gets sick s/p anesthesia  . Morbid obesity with body mass index of 50.0-59.9 in adult [facility]) JAN 2011 370 LBS    2004 311 BMI 45.9  . PTSD (post-traumatic stress disorder)   . Asthma   . Allergy   . Urine incontinence   . Chronic headaches   . Suicidal ideation   . COPD (chronic obstructive pulmonary disease) ([place])   . CHF (congestive heart failure) ([facility])     diastolic dysfunction    Past Surgical History  Procedure Laterality  Date  . Cholecystectomy    . Knee arthroscopy    . Appendectomy    . Tonsillectomy    . Back surgery  2008  . Total vaginal hysterectomy    . Abdominal hysterectomy      sept 1996  . Tubal ligation    . Colonoscopy  10/17/2002     Distal proctitis, small external hemorrhoids, otherwise/  normal colonoscopy. Suspect rectal bleeding secondary to hemorrhoids  . Esophagogastroduodenoscopy  03/18/09    fundic gland polyps/mild gastritis  . Cardiac catheterization      nov 1997  . Joint replacement      bil knee replacement  . Hernia repair  1978    There were no vitals filed for this visit.  Visit Diagnosis:  Right shoulder pain - Plan: PT plan of care cert/re-cert  Activity intolerance - Plan: PT plan of care cert/re-cert      Subjective Assessment - 07/30/15 1037    Subjective Patient reports her R shoulder began hurting two months ago. Xrays showed a bone spur and arthritis. Patient presents for a HEP.   Diagnostic tests xray   Patient Stated Goals to get a HEP   Currently in Pain? Yes  Pain Score 7    Pain Location Shoulder   Pain Orientation Right   Pain Descriptors / Indicators Nagging   Pain Type Acute pain   Pain Onset More than a month ago   Pain Frequency Constant   Aggravating Factors  movement, overuse   Pain Relieving Factors heat   Effect of Pain on Daily Activities limited with ADLS            [facility] PT Assessment - 07/30/15 0001    Assessment   Medical Diagnosis R shoulder pain   Referring Provider Gwyndolyn Saxon L. Cecilie Lowers III   Onset Date/Surgical Date 06/01/15   Next MD Visit 6 weeks   Precautions   Precaution Comments Bee sting allergy   Balance Screen   Has the patient fallen in the past 6 months Yes   How many times? 2   Has the patient had a decrease in activity level because of a fear of falling?  Yes   Is the patient reluctant to leave their home because of a fear of falling?  No   Home Environment   Living Environment Private residence    Living Arrangements Other relatives  niece   Type of Home Mobile home   Home Access Stairs to enter   Entrance Stairs-Rails Right  aggravates shoulder   Home Equipment [place] - single point   Prior Function   Level of Independence Independent   Observation/Other Assessments   Focus on Therapeutic Outcomes (FOTO)  clinical judgement   Posture/Postural Control   Posture Comments significantly rounded and depressed shoulders.and forward head   ROM / Strength   AROM / PROM / Strength AROM;Strength   AROM   AROM Assessment Site Shoulder   Right/Left Shoulder Right   Right Shoulder Flexion 110 Degrees  124 passive   Right Shoulder ABduction 125 Degrees  138 passive   Right Shoulder Internal Rotation 75 Degrees  78 passive   Right Shoulder External Rotation 28 Degrees  55 passive   Strength   Overall Strength Comments empty can position 4/5   Strength Assessment Site Shoulder   Right/Left Shoulder Right   Right Shoulder Flexion 4+/5   Right Shoulder Extension 5/5   Right Shoulder ABduction 4+/5   Right Shoulder Internal Rotation 5/5   Right Shoulder External Rotation 4+/5   Palpation   Palpation comment marked tenderness of R RC insertion points (all) as well as pecs and supraspinatus muscle belly   Special Tests    Special Tests Rotator Cuff Impingement   Rotator Cuff Impingment tests Michel Bickers test;Neer impingement test;Empty Can test;Drop Arm test   Neer Impingement test    Findings Positive   Side Right   Hawkins-Kennedy test   Findings Negative   Side Right   Empty Can test   Findings Negative   Side Right   Drop Arm test   Findings Negative   Side Right                           PT Education - 07/30/15 1244    Education provided Yes   Education Details HEP - attempted numerous exercises including supine and seated cane and [place]wood to determine most appropriate for HEP.   Person(s) Educated Patient   Methods  Explanation;Demonstration;Handout   Comprehension Returned demonstration;Verbalized understanding;Verbal cues required;Tactile cues required          PT Short Term Goals - 07/30/15 1248    PT SHORT TERM GOAL #1  Title I with HEP   Time 1   Period Days   Status Achieved                  Plan - August 25, 2015 1245    Clinical Impression Statement Patient presents for one time PT eval for a HEP to treat her R shoulder pain that is limiting ADLS. She has limited ROM and poor posture, but her strength is fairly good. She demos poor posture resulting in post weakness and ant tightness of R upper quadrant.   Pt will benefit from skilled therapeutic intervention in order to improve on the following deficits Decreased range of motion;Pain;Impaired UE functional use;Decreased activity tolerance;Decreased strength;Postural dysfunction   Rehab Potential Good   PT Frequency One time visit   PT Treatment/Interventions Therapeutic exercise;Patient/family education   PT Next Visit Plan one time visit   PT Home Exercise Plan wall slides for flex/abd; pec stretch; TBand horizontal ABD and PNF diagonals;    Consulted and Agree with Plan of Care Patient          G-Codes - August 25, 2015 1249    Functional Assessment Tool Used Clinical Judgement   Functional Limitation Carrying, moving and handling objects   Carrying, Moving and Handling Objects Current Status [phone]) At least 40 percent but less than 60 percent impaired, limited or restricted   Carrying, Moving and Handling Objects Goal Status ([id number]) At least 40 percent but less than 60 percent impaired, limited or restricted       Problem List Patient Active Problem List   Diagnosis Date Noted  . Severe obesity (BMI >= 40) ([place]) 07/05/2015  . Hyperlipidemia LDL goal <130 07/05/2015  . MDD (major depressive disorder), single episode, severe with psychotic features ([place]) 01/21/2015  . 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 Acute cystitis  with hematuria 04/01/2014  . Bilateral knee pain 01/07/2014  . Scar condition and fibrosis of skin 12/11/2013  . Essential hypertension, benign 07/01/2013  . Acute respiratory failure ([place]) 11/05/2012  . Asthma with acute exacerbation 11/04/2012  . OSA on CPAP 11/04/2012  . Back pain, chronic 08/27/2012  . Vitamin D insufficiency 04/04/2012  . Insomnia due to mental disorder 04/04/2012  . Anxiety 04/04/2012  . OCD (obsessive compulsive disorder) 04/04/2012  . PTSD (post-traumatic stress disorder) 06/28/2011  . Coronary artery disease 08/26/2010  . FATTY LIVER DISEASE 03/29/2009  . GERD 03/01/2009  . Morbid obesity ([place]) 02/15/2009  . HYPOTHYROIDISM 02/12/2009   Madelyn Flavors PT  August 25, 2015, 12:53 PM  [facility] [address] [place], [place], [address] Phone: [phone]   Fax:  [phone]  Name: Allison Sharp MRN: [id number] Date of Birth: 1956-03-17   PHYSICAL THERAPY DISCHARGE SUMMARY  Visits from Start of Care: 1  Current functional level related to goals / functional outcomes: See above   Remaining deficits: See above   Education / Equipment: HEP  Plan: Patient agrees to discharge.  Patient goals were met. Patient is being discharged due to meeting the stated rehab goals.  ?????       Madelyn Flavors, PT 25-Aug-2015 12:55 PM [facility] [place], [place], [address] Phone: [phone]   Fax:  [phone]

## 2015-07-30 NOTE — Telephone Encounter (Signed)
Left detailed message stating requested referral was placed and to CB with any further questions or concerns.

## 2015-07-30 NOTE — Patient Instructions (Addendum)
  Flexibility: Corner Stretch  Standing in corner or doorway with hands just above shoulder level and feet ____ inches from corner, lean forward until a comfortable stretch is felt across chest. Hold _30-60___ seconds. Repeat 3____ times per set. Do ____ sets per session. Do __2 __ sessions per day.   Strengthening: Chest Pull - Resisted   With resistive band looped around each hand, and arms straight out in front, stretch band across chest. Repeat __10__ times per set. Do 1-3____ sets per session. Do _1___ sessions per day.  http://orth.exer.us/926   PNF Strengthening: Resisted   Standing with resistive band around each hand, bring right arm up and away, thumb back. Do both sides. Repeat 10____ times per set. Do _1-3___ sets per session. Do ___1_ sessions per day.  Robber: Scapular Retraction: Elbow Flexion (Standing)   With elbows bent to 90, pinch shoulder blades together and rotate arms out, keeping elbows bent. Repeat _10___ times per set. Do _1-3___ sets per session. Do __1__ sessions per day. May use _____ pound weights.  http://orth.exer.us/948   Copyright  VHI. All rights reserved.

## 2015-07-31 ENCOUNTER — Telehealth: Payer: Self-pay | Admitting: Cardiology

## 2015-07-31 NOTE — Addendum Note (Signed)
Addended by: Sueanne Margarita on: 07/31/2015 07:56 PM   Modules accepted: Orders

## 2015-07-31 NOTE — Sleep Study (Signed)
Patient Name: Allison Sharp, Allison Sharp MRN: [id number] Study Date: 07/30/2015 Gender: Female D.O.B: 03-02-1956 Age (years): 59 Referring Provider: Dorris Carnes Interpreting Physician: Fransico Him MD, ABSM RPSGT: [place], Holly  Weight (lbs): 457 BMI: 67 Height (inches): 69 Neck Size: 15.50  CLINICAL INFORMATION Sleep Study Type: Split Night CPAP Indication for sleep study: COPD, Obesity, OSA Epworth Sleepiness Score: 3  SLEEP STUDY TECHNIQUE As per the AASM Manual for the Scoring of Sleep and Associated Events v2.3 (April 2016) with a hypopnea requiring 4% desaturations. The channels recorded and monitored were frontal, central and occipital EEG, electrooculogram (EOG), submentalis EMG (chin), nasal and oral airflow, thoracic and abdominal wall motion, anterior tibialis EMG, snore microphone, electrocardiogram, and pulse oximetry. Continuous positive airway pressure (CPAP) was initiated when the patient met split night criteria and was titrated according to treat sleep-disordered breathing.  MEDICATIONS Medications taken by the patient : Albuterol, Atorvastatin, Zyrtec, clonidine, Depakote, Doxepin, Cymbalta, Flonase, Breo, Synthroid, Prinizide, Mobic, Prilosec, resperidol. Medications administered by patient during sleep study : No sleep medicine administered.  RESPIRATORY PARAMETERS Diagnostic Total AHI (/hr): 7.7  RDI (/hr): 7.7  OA Index (/hr): -   CA Index (/hr): 0.0 REM AHI (/hr): 33.4  NREM AHI (/hr):1.4  Supine AHI (/hr):7.5  Non-supine AHI (/hr):17.14 Min O2 Sat (%):83.00  Mean O2 (%): 93.11  Time below 88% (min):5.7    Titration Optimal Pressure (cm):11  AHI at Optimal Pressure (/hr):0.0  Min O2 at Optimal Pressure (%):93.0 Supine % at Optimal (%):100  Sleep % at Optimal (%):94    SLEEP ARCHITECTURE The recording time for the entire night was 367.7 minutes. During a baseline period of 195.7 minutes, the patient slept for 156.5 minutes in REM and nonREM, yielding a  reduced sleep efficiency of 80.0%. Sleep onset after lights out was 20.8 minutes with a REM latency of 121.0 minutes. The patient spent 4.79% of the night in stage N1 sleep, 71.88% in stage N2 sleep, 3.83% in stage N3 and 19.49% in REM.   During the titration period of 170.0 minutes, the patient slept for 163.0 minutes in REM and nonREM, yielding a sleep efficiency of 95.9%. Sleep onset after CPAP initiation was 0.5 minutes with a REM latency of 69.5 minutes. The patient spent 2.15% of the night in stage N1 sleep, 80.37% in stage N2 sleep, 0.00% in stage N3 and 17.49% in REM.  CARDIAC DATA The 2 lead EKG demonstrated sinus rhythm. The mean heart rate was 79.08 beats per minute. Other EKG findings include: PVCs.  LEG MOVEMENT DATA The total Periodic Limb Movements of Sleep (PLMS) were 0. The PLMS index was 0.00 .  IMPRESSIONS - Mild obstructive sleep apnea occurred during the diagnostic portion of the study (AHI = 7.7 /hour). An optimal PAP pressure was selected for this patient ( 11 cm of water) - No significant central sleep apnea occurred during the diagnostic portion of the study (CAI = 0.0/hour). - Mild oxygen desaturation was noted during the diagnostic portion of the study (Min O2 = 83.00%). - The patient snored with Soft snoring volume during the diagnostic portion of the study. - PVCs were noted during this study. - Clinically significant periodic limb movements did not occur during sleep.  DIAGNOSIS - Obstructive Sleep Apnea (327.23 [G47.33 ICD-10])  RECOMMENDATIONS - Trial of CPAP therapy on 11 cm H2O with a Large size Resmed Full Face Mask AirFit F10 mask and heated humidification. - Avoid alcohol, sedatives and other CNS depressants that may worsen sleep apnea and disrupt normal sleep  architecture. - Sleep hygiene should be reviewed to assess factors that may improve sleep quality. - Weight management and regular exercise should be initiated or continued. - Return to Sleep  Center for re-evaluation after 10 weeks of therapy    [place], American Board of Sleep Medicine  ELECTRONICALLY SIGNED ON:  07/31/2015, 7:50 PM [place] [facility] PH: [phone]   FX: [phone] [place]

## 2015-07-31 NOTE — Telephone Encounter (Signed)
Please let patient know that they have significant sleep apnea and had successful CPAP titration and will be set up with CPAP unit.  Please let DME know that order is in EPIC.  Please set patient up for OV in 10 weeks 

## 2015-08-02 ENCOUNTER — Telehealth: Payer: Self-pay | Admitting: Family Medicine

## 2015-08-03 ENCOUNTER — Telehealth: Payer: Self-pay | Admitting: Family Medicine

## 2015-08-03 NOTE — Telephone Encounter (Signed)
Usually don't give [name] like a clearance letter or something that we sign. Usually there is likely some form or something? [name], MD [facility] 08/03/2015, 4:35 PM

## 2015-08-03 NOTE — Telephone Encounter (Signed)
Patient is at moderate to high risk for surgery and sedation because of sleep apnea and asthma that is moderately controlled. That is the best that I can say in this letter for them. [name], MD [facility] 08/03/2015, 5:05 PM

## 2015-08-03 NOTE — Telephone Encounter (Signed)
The oral surgeon said that they just need a letter stating it is ok for her to have oral surgery they do not have a form

## 2015-08-04 ENCOUNTER — Encounter: Payer: Self-pay | Admitting: *Deleted

## 2015-08-04 ENCOUNTER — Telehealth: Payer: Self-pay | Admitting: Family Medicine

## 2015-08-04 NOTE — Telephone Encounter (Signed)
Letter for oral surgery was faxed.  Left patient a message to call for a review of letter contents.

## 2015-08-06 NOTE — Telephone Encounter (Signed)
Patient informed of information, stated verbal understanding. [place] notified of orders. Once patient begins CPAP Therapy I will schedule 10 week follow-up.

## 2015-08-06 NOTE — Telephone Encounter (Signed)
Left message to call back  

## 2015-08-10 ENCOUNTER — Telehealth: Payer: Self-pay | Admitting: Family Medicine

## 2015-08-10 NOTE — Telephone Encounter (Signed)
Patient is interested in weight loss surgery, would like to meet with Dr. Warrick Parisian in April to discuss, made appointment for 09/06/15 at 8:10 am.

## 2015-08-11 ENCOUNTER — Encounter (HOSPITAL_COMMUNITY): Payer: Self-pay

## 2015-08-11 ENCOUNTER — Encounter (HOSPITAL_COMMUNITY): Payer: Self-pay | Admitting: Clinical

## 2015-08-11 ENCOUNTER — Ambulatory Visit (INDEPENDENT_AMBULATORY_CARE_PROVIDER_SITE_OTHER): Payer: Medicare Other | Admitting: Clinical

## 2015-08-11 ENCOUNTER — Encounter (HOSPITAL_COMMUNITY)
Admission: RE | Admit: 2015-08-11 | Discharge: 2015-08-11 | Disposition: A | Discharge status: Home / Self Care | Payer: Medicare Other | Source: Ambulatory Visit | Attending: Oral Surgery | Admitting: Oral Surgery

## 2015-08-11 DIAGNOSIS — F250 Schizoaffective disorder, bipolar type: Secondary | ICD-10-CM

## 2015-08-11 DIAGNOSIS — E785 Hyperlipidemia, unspecified: Secondary | ICD-10-CM | POA: Insufficient documentation

## 2015-08-11 DIAGNOSIS — I2510 Atherosclerotic heart disease of native coronary artery without angina pectoris: Secondary | ICD-10-CM | POA: Insufficient documentation

## 2015-08-11 DIAGNOSIS — Z01818 Encounter for other preprocedural examination: Secondary | ICD-10-CM | POA: Insufficient documentation

## 2015-08-11 DIAGNOSIS — E0590 Thyrotoxicosis, unspecified without thyrotoxic crisis or storm: Secondary | ICD-10-CM | POA: Diagnosis not present

## 2015-08-11 DIAGNOSIS — K089 Disorder of teeth and supporting structures, unspecified: Secondary | ICD-10-CM | POA: Insufficient documentation

## 2015-08-11 DIAGNOSIS — K219 Gastro-esophageal reflux disease without esophagitis: Secondary | ICD-10-CM | POA: Diagnosis not present

## 2015-08-11 DIAGNOSIS — J449 Chronic obstructive pulmonary disease, unspecified: Secondary | ICD-10-CM | POA: Diagnosis not present

## 2015-08-11 DIAGNOSIS — I5030 Unspecified diastolic (congestive) heart failure: Secondary | ICD-10-CM | POA: Insufficient documentation

## 2015-08-11 DIAGNOSIS — I110 Hypertensive heart disease with heart failure: Secondary | ICD-10-CM | POA: Insufficient documentation

## 2015-08-11 DIAGNOSIS — G4733 Obstructive sleep apnea (adult) (pediatric): Secondary | ICD-10-CM | POA: Diagnosis not present

## 2015-08-11 DIAGNOSIS — I252 Old myocardial infarction: Secondary | ICD-10-CM | POA: Insufficient documentation

## 2015-08-11 DIAGNOSIS — Z79899 Other long term (current) drug therapy: Secondary | ICD-10-CM | POA: Insufficient documentation

## 2015-08-11 DIAGNOSIS — F319 Bipolar disorder, unspecified: Secondary | ICD-10-CM | POA: Diagnosis not present

## 2015-08-11 DIAGNOSIS — Z01812 Encounter for preprocedural laboratory examination: Secondary | ICD-10-CM | POA: Diagnosis not present

## 2015-08-11 HISTORY — DX: Other specified postprocedural states: Z98.890

## 2015-08-11 HISTORY — DX: Nausea with vomiting, unspecified: R11.2

## 2015-08-11 LAB — CBC
HEMATOCRIT: 42.4 % (ref 36.0–46.0)
Hemoglobin: 14.1 g/dL (ref 12.0–15.0)
MCH: 30.8 pg (ref 26.0–34.0)
MCHC: 33.3 g/dL (ref 30.0–36.0)
MCV: 92.6 fL (ref 78.0–100.0)
Platelets: 189 10*3/uL (ref 150–400)
RBC: 4.58 MIL/uL (ref 3.87–5.11)
RDW: 13.3 % (ref 11.5–15.5)
WBC: 11 10*3/uL — AB (ref 4.0–10.5)

## 2015-08-11 LAB — BASIC METABOLIC PANEL
Anion gap: 11 (ref 5–15)
BUN: 16 mg/dL (ref 6–20)
CHLORIDE: 99 mmol/L — AB (ref 101–111)
CO2: 29 mmol/L (ref 22–32)
CREATININE: 0.93 mg/dL (ref 0.44–1.00)
Calcium: 10.1 mg/dL (ref 8.9–10.3)
GFR calc non Af Amer: >60 mL/min (ref >60)
Glucose, Bld: 96 mg/dL (ref 65–99)
POTASSIUM: 4.4 mmol/L (ref 3.5–5.1)
SODIUM: 139 mmol/L (ref 135–145)

## 2015-08-11 NOTE — Progress Notes (Addendum)
PCP is Dr Vonna Kotyk [place] Cardiologist is Dr Dorris Carnes States she will be having a stress test soon, is waiting on the Dr to call her. Instructed her to bring ib cpap mask on the day of surgery, voices understanding. States it has been years ago since her last stress test. (care everywhere)

## 2015-08-11 NOTE — Pre-Procedure Instructions (Addendum)
Allison Sharp  08/11/2015      [id number] [place], [place] [address] [place] [address] Phone: [phone] Fax: [phone]  [place], [address] [place] [address] Phone: [phone] Fax: [phone]    Your procedure is scheduled on March 20  Report to [facility] at [phone].  Call this number if you have problems the morning of surgery:  [phone]   Remember:  Do not eat food or drink liquids after midnight.   Take these medicines the morning of surgery with A SIP OF WATER Tylenol if needed, albuterol (Proventil) inhaler if needed-bring inhalers with you in the day of surgery, Albuterol (Proventil Nebulizer if needed, Amoxicillin (AmoxiL), cetirizine (Zyrtec), Duloxetine (Cymbalta), Flonase nasal spray if needed, Breo inhaler, levothyroxine (Synthroid), omeprazole (prilosec)  Stop taking Aspirin, Ibuprofen, Advil, Motrin, Aleve, BC's, Goody's, Herbal medications, Fish Oil  Do not wear jewelry, make-up or nail polish.  Do not wear lotions, powders, or perfumes.  You may wear deodorant.  Do not shave 48 hours prior to surgery.  Men may shave face and neck.  Do not bring valuables to the hospital.  [facility] is not responsible for any belongings or valuables.  Contacts, dentures or bridgework may not be worn into surgery.  Leave your suitcase in the car.  After surgery it may be brought to your room.  For patients admitted to the hospital, discharge time will be determined by your treatment team.  Patients discharged the day of surgery will not be allowed to drive home.    Special instructions:  [facility] - Preparing for Surgery  Before surgery, you can play an important role.  Because skin is not sterile, your skin needs to be as free of germs as possible.  You can reduce the number of germs on you skin by washing with CHG (chlorahexidine  gluconate) soap before surgery.  CHG is an antiseptic cleaner which kills germs and bonds with the skin to continue killing germs even after washing.  Please DO NOT use if you have an allergy to CHG or antibacterial soaps.  If your skin becomes reddened/irritated stop using the CHG and inform your nurse when you arrive at Short Stay.  Do not shave (including legs and underarms) for at least 48 hours prior to the first CHG shower.  You may shave your face.  Please follow these instructions carefully:   1.  Shower with CHG Soap the night before surgery and the    morning of Surgery.  2.  If you choose to wash your hair, wash your hair first as usual with your  normal shampoo.  3.  After you shampoo, rinse your hair and body thoroughly to remove the     Shampoo.  4.  Use CHG as you would any other liquid soap.  You can apply chg directly  to the skin and wash gently with scrungie or a clean washcloth.  5.  Apply the CHG Soap to your body ONLY FROM THE NECK DOWN.   Do not use on open wounds or open sores.  Avoid contact with your eyes, ears, mouth and genitals (private parts).  Wash genitals (private parts)       with your normal soap.  6.  Wash thoroughly, paying special attention to the area where your surgery  will be performed.  7.  Thoroughly rinse your body with  warm water from the neck down.  8.  DO NOT shower/wash with your normal soap after using and rinsing off  the CHG Soap.  9.  Pat yourself dry with a clean towel.            10.  Wear clean pajamas.            11.  Place clean sheets on your bed the night of your first shower and do not sleep with pets.  Day of Surgery  Do not apply any lotions/deoderants the morning of surgery.  Please wear clean clothes to the hospital/surgery center.     Please read over the following fact sheets that you were given. Pain Booklet, Coughing and Deep Breathing and Surgical Site Infection Prevention

## 2015-08-11 NOTE — Progress Notes (Signed)
   THERAPIST PROGRESS NOTE  Session Time: 10:45 - 11:50  Participation Level: Active  Behavioral Response: CasualAlertDepressed  Type of Therapy: Individual Therapy  Treatment Goals addressed: Improve Psychiatric Symptoms, Elevate Mood ( decrease in feeling overwhelmed, increase in hopefulness), Improve Unhelpful Thought Patterns, Emotional Regulation Skills (anger and stress management),   Interventions: Motivational Interviewing, CBT,   Summary: Allison Sharp is a 65. y.o. female who presents with Schizoaffective disorder, bipolar type  Suicidal/Homicidal: No -without intent/plan  Therapist Response: Allison Sharp met with clinician for an individual session. Allison Sharp discussed her psychiatric symptoms, her current life events and her homework. Allison Sharp shared that she missed her last session due to illness. She shared that she had the flu and is also having issues with her teeth. She shared that she is going to have her teeth removed so that she can get dentures. Allison Sharp shared that the voices have been manageable lately. She shared that she has been using her grounding techniques when she can remember and that they are useful. She shared that she would like to focus on managing her anger better. Client and clinician discussed some of the events that have been angering her lately. Client and clinician discussed the events, her negative automatic thoughts, she examined the evidence for and against the thoughts. She was then able to formulate healthier alternative thoughts. Allison Sharp had the insight that she takes things personally. She shared that this is what fuels her anger. Client and clinician discussed whether or not the behaviors of others are personal to her. Clinician asked open ended questions and Allison Sharp shared that they are not personal. Clinician and client discussed whether or not she was only focusing on the unwanted behaviors of others instead of seeing them as a whole. She was then able to recognize  that she was discounting the positive behaviors. She shared that if she focused on both she could address the behaviors without becoming overly angry. Client and clinician discussed that this would decrease her stress and irritability. Allison Sharp shared that she had partially completed her distress tolerance packet. Client and clinician discussed and reviewed what she had finished. Allison Sharp agreed to complete the packet and bring i with her to next session, she agreed to continue to practice her grounding and mindfulness techniques until next session.      Plan: Return again in 1- 2 week  Diagnosis:     Axis I: Schizoaffective disorder, bipolar type   Feiga Nadel A, LCSW 08/11/2015

## 2015-08-12 NOTE — H&P (Signed)
HISTORY AND PHYSICAL  Allison Sharp is a 60 y.o. female patient referred by general dentist for multiple extractions in preparation for denture/partial.  No diagnosis found.  Past Medical History  Diagnosis Date  . Hyperlipidemia   . CAD (coronary artery disease)   . Bipolar 1 disorder ([place])   . GERD (gastroesophageal reflux disease)   . Hyperthyroidism   . Chronic back pain   . Dyspnea     PFT 03/05/09 FEV1 2.77(98%), FVC 3.25(86%), FEV1% 85, TLC 5.88(99%), DLCO 60% ,  Methacholine challenge 03/16/09 normal ,  CT chest 03/12/09 no pulmonary disease  . Anxiety   . Arthritis   . Depression   . OSA on CPAP     2 liters  . HTN (hypertension)   . History of colonoscopy 10/17/2002    by Dr Rehman-> distal non-specific proctitis, small ext hemorrhoids,   . Fungal infection   . Cellulitis   . Contusion of sacrum   . Migraine headache   . Vitamin D deficiency   . Sleep apnea   . Myocardial infarction ([place])     NOV 1997  . Hypothyroidism     States she only has hyperthyroidism  . Complication of anesthesia     States she typically gets sick s/p anesthesia  . Morbid obesity with body mass index of 50.0-59.9 in adult [facility]) JAN 2011 370 LBS    2004 311 BMI 45.9  . PTSD (post-traumatic stress disorder)   . Asthma   . Allergy   . Urine incontinence   . Chronic headaches   . Suicidal ideation   . COPD (chronic obstructive pulmonary disease) ([place])   . CHF (congestive heart failure) ([facility])     diastolic dysfunction  . PONV (postoperative nausea and vomiting)     No current facility-administered medications for this encounter.   Current Outpatient Prescriptions  Medication Sig Dispense Refill  . acetaminophen (TYLENOL) 500 MG tablet Take 1,000 mg by mouth every 6 (six) hours as needed for mild pain.    [place] Kitchen albuterol (PROVENTIL HFA;VENTOLIN HFA) 108 (90 BASE) MCG/ACT inhaler Inhale 2 puffs into the lungs every 4 (four) hours as needed for wheezing or shortness of breath. 1 Inhaler 0   . albuterol (PROVENTIL) (2.5 MG/3ML) 0.083% nebulizer solution Take 3 mLs (2.5 mg total) by nebulization every 4 (four) hours as needed for wheezing or shortness of breath. 75 mL 0  . amoxicillin (AMOXIL) 500 MG tablet Take 2,000 mg by mouth See admin instructions. Take 2000 mg before any type of surgical procedural    . atorvastatin (LIPITOR) 20 MG tablet Take 1 tablet (20 mg total) by mouth daily. 90 tablet 3  . cetirizine (ZYRTEC) 10 MG tablet Take 1 tablet (10 mg total) by mouth daily. 30 tablet 3  . cloNIDine (CATAPRES) 0.1 MG tablet TAKE ONE TABLET AT BEDTIME 30 tablet 5  . Clotrimazole 1 % OINT Apply 1 application topically 2 (two) times daily as needed (SKIN RASHES).    [place] Kitchen diclofenac sodium (VOLTAREN) 1 % GEL Apply to affected area twice daily as needed 100 g 0  . divalproex (DEPAKOTE) 500 MG DR tablet Take 2 tablets (1,000 mg total) by mouth at bedtime. 60 tablet 1  . doxepin (SINEQUAN) 25 MG capsule Take 1 capsule (25 mg total) by mouth daily. 30 capsule 1  . DULoxetine (CYMBALTA) 60 MG capsule Take 1 capsule (60 mg total) by mouth daily. 30 capsule 0  . fluticasone (FLONASE) 50 MCG/ACT nasal spray Place 1 spray  into both nostrils 2 (two) times daily as needed for allergies or rhinitis. 16 g 6  . fluticasone furoate-vilanterol (BREO ELLIPTA) 100-25 MCG/INH AEPB Inhale 1 puff into the lungs daily. 1 each 2  . levothyroxine (SYNTHROID, LEVOTHROID) 112 MCG tablet Take 1 tablet (112 mcg total) by mouth daily before breakfast. For hypothyroidism. 90 tablet 2  . lisinopril-hydrochlorothiazide (PRINZIDE,ZESTORETIC) 20-25 MG tablet Take 1 tablet by mouth daily. 30 tablet 4  . Multiple Vitamin (MULTIVITAMIN WITH MINERALS) TABS Take 1 tablet by mouth daily. For nutritional supplementation. 30 tablet 0  . omeprazole (PRILOSEC) 20 MG capsule Take 1 capsule (20 mg total) by mouth daily. 30 capsule 6  . risperiDONE (RISPERDAL) 1 MG tablet Take 1 tablet (1 mg total) by mouth at bedtime. 30 tablet 0    Allergies  Allergen Reactions  . Haldol [Haloperidol] Other (See Comments)    Hallucinating   . Ativan [Lorazepam] Other (See Comments)    Delirium  . Naproxen Nausea And Vomiting   Active Problems:   * No active hospital problems. *  Vitals: Last menstrual period 02/18/1995. Lab results: Results for orders placed or performed during the hospital encounter of 08/11/15 (from the past 24 hour(s))  Basic metabolic panel     Status: Abnormal   Collection Time: 08/11/15  2:05 PM  Result Value Ref Range   Sodium 139 135 - 145 mmol/L   Potassium 4.4 3.5 - 5.1 mmol/L   Chloride 99 (L) 101 - 111 mmol/L   CO2 29 22 - 32 mmol/L   Glucose, Bld 96 65 - 99 mg/dL   BUN 16 6 - 20 mg/dL   Creatinine, Ser 0.93 0.44 - 1.00 mg/dL   Calcium 10.1 8.9 - 10.3 mg/dL   GFR calc non Af Amer >60 >60 mL/min   GFR calc Af Amer >60 >60 mL/min   Anion gap 11 5 - 15  CBC     Status: Abnormal   Collection Time: 08/11/15  2:05 PM  Result Value Ref Range   WBC 11.0 (H) 4.0 - 10.5 K/uL   RBC 4.58 3.87 - 5.11 MIL/uL   Hemoglobin 14.1 12.0 - 15.0 g/dL   HCT 42.4 36.0 - 46.0 %   MCV 92.6 78.0 - 100.0 fL   MCH 30.8 26.0 - 34.0 pg   MCHC 33.3 30.0 - 36.0 g/dL   RDW 13.3 11.5 - 15.5 %   Platelets 189 150 - 400 K/uL   Radiology Results: No results found. General appearance: alert, cooperative, no distress and morbidly obese Head: Normocephalic, without obvious abnormality, atraumatic Eyes: negative Nose: Nares normal. Septum midline. Mucosa normal. No drainage or sinus tenderness. Throat: lips, mucosa, and tongue normal; teeth and gums normal and dental caries remaining maxillary teeth, right mandibular premolar. Bilateral tori. Pharynx clear, no trismus. Neck: no adenopathy, supple, symmetrical, trachea midline and thyroid not enlarged, symmetric, no tenderness/mass/nodules Resp: clear to auscultation bilaterally Cardio: regular rate and rhythm, S1, S2 normal, no murmur, click, rub or  gallop  Assessment:Nonrestorable teeth, mandibular tori  Plan:Dental extractions with alveoloplasty, removal mandibular tori. General anesthesia. Day surgery.  Gae Bon 08/12/2015

## 2015-08-12 NOTE — Progress Notes (Signed)
Anesthesia Chart Review:  Pt is a 60 year old female scheduled for multiple dental extractions with alveoloplasty on 08/16/2015 with Dr. Hoyt Koch.   Cardiologist is Dr. Harrington Challenger, last office visit 02/2015, prn f/u recommended. PCP is Dr. Vonna Kotyk Dettinger who cleared pt "at moderate to high risk for surgery and sedation because of sleep apnea and asthma that is moderately controlled"  PMH includes:  CAD, MI (0000000), diastolic CHF, HTN, OSA (on CPAP, 2 liters), hyperthyroidism, hyperlipidemia, COPD, bipolar disorder, GERD, post-op N/V. Never smoker. BMI 54  Medications include: albuterol, lipitor, clonidine, depakote, breo ellipta, levothyroxine, lisinopril-hctz, prilosec, risperdal.   Preoperative labs reviewed.    Chest x-ray 05/23/15 reviewed. No acute disease.   EKG 07/05/15: Sinus  Rhythm. Nonspecific T-abnormality. Low voltage with rightward P-axis and rotation -possible pulmonary disease.   Echo 11/05/12:  - Left ventricle: The cavity size was normal. Wall thickness was increased in a pattern of mild LVH. Systolic function was normal. Although no diagnostic regional wall motion abnormality was identified, this possibility cannot be completely excluded on the basis of this study. Doppler parameters are consistent with abnormal left ventricular relaxation (grade 1 diastolic dysfunction).  Nuclear stress test 09/14/10:  . Physiological distribution of the radiotracer . No unexpected findings on raw images . No transient ischemic dilatation. . No fixed or transient perfusion defects at stress or rest. . Ejection fraction: 69%.  Cardiac cath 05/02/00:  - Mid LAD 25%, distal LAD 30% - Normal LV systolic performance, EF [id number] and normal wall motion. Normal LVEDP of 10 mmHg  Pt reports that her PCP said she should get a stress test since hers is from 2012. This has not been scheduled yet. She denies CP. She reports that she had been having SOB, but that her PCP dx her with COPD and put her on a Breo  Ellipta inhaler and her symptoms of SOB have improved greatly. Denies having any current CV symptoms, denies feeling at all like she did when she had an MI in 1997.   Reviewed case with Dr. Linna Caprice.   If no changes, I anticipate pt can proceed with surgery as scheduled.   Willeen Cass, FNP-BC [facility]/Anesthesiology Phone: [phone] 08/12/2015 2:20 PM

## 2015-08-16 ENCOUNTER — Encounter (HOSPITAL_COMMUNITY): Payer: Self-pay | Admitting: *Deleted

## 2015-08-16 ENCOUNTER — Ambulatory Visit (HOSPITAL_COMMUNITY)
Admission: RE | Admit: 2015-08-16 | Discharge: 2015-08-16 | Disposition: A | Discharge status: Home / Self Care | Payer: Medicare Other | Source: Ambulatory Visit | Attending: Oral Surgery | Admitting: Oral Surgery

## 2015-08-16 ENCOUNTER — Ambulatory Visit (HOSPITAL_COMMUNITY): Payer: Medicare Other | Admitting: Emergency Medicine

## 2015-08-16 ENCOUNTER — Ambulatory Visit (HOSPITAL_COMMUNITY): Payer: Medicare Other | Admitting: Certified Registered Nurse Anesthetist

## 2015-08-16 ENCOUNTER — Encounter (HOSPITAL_COMMUNITY)
Admission: RE | Disposition: A | Discharge status: Home / Self Care | Payer: Self-pay | Source: Ambulatory Visit | Attending: Oral Surgery

## 2015-08-16 DIAGNOSIS — F319 Bipolar disorder, unspecified: Secondary | ICD-10-CM | POA: Insufficient documentation

## 2015-08-16 DIAGNOSIS — Z79899 Other long term (current) drug therapy: Secondary | ICD-10-CM | POA: Diagnosis not present

## 2015-08-16 DIAGNOSIS — M549 Dorsalgia, unspecified: Secondary | ICD-10-CM | POA: Diagnosis not present

## 2015-08-16 DIAGNOSIS — E039 Hypothyroidism, unspecified: Secondary | ICD-10-CM | POA: Diagnosis not present

## 2015-08-16 DIAGNOSIS — K219 Gastro-esophageal reflux disease without esophagitis: Secondary | ICD-10-CM | POA: Insufficient documentation

## 2015-08-16 DIAGNOSIS — E785 Hyperlipidemia, unspecified: Secondary | ICD-10-CM | POA: Insufficient documentation

## 2015-08-16 DIAGNOSIS — I110 Hypertensive heart disease with heart failure: Secondary | ICD-10-CM | POA: Diagnosis not present

## 2015-08-16 DIAGNOSIS — M270 Developmental disorders of jaws: Secondary | ICD-10-CM | POA: Diagnosis not present

## 2015-08-16 DIAGNOSIS — K029 Dental caries, unspecified: Secondary | ICD-10-CM | POA: Diagnosis not present

## 2015-08-16 DIAGNOSIS — G4733 Obstructive sleep apnea (adult) (pediatric): Secondary | ICD-10-CM | POA: Diagnosis not present

## 2015-08-16 DIAGNOSIS — Z7951 Long term (current) use of inhaled steroids: Secondary | ICD-10-CM | POA: Insufficient documentation

## 2015-08-16 DIAGNOSIS — J449 Chronic obstructive pulmonary disease, unspecified: Secondary | ICD-10-CM | POA: Insufficient documentation

## 2015-08-16 DIAGNOSIS — Z9989 Dependence on other enabling machines and devices: Secondary | ICD-10-CM | POA: Insufficient documentation

## 2015-08-16 DIAGNOSIS — M1990 Unspecified osteoarthritis, unspecified site: Secondary | ICD-10-CM | POA: Diagnosis not present

## 2015-08-16 DIAGNOSIS — I252 Old myocardial infarction: Secondary | ICD-10-CM | POA: Insufficient documentation

## 2015-08-16 DIAGNOSIS — I5030 Unspecified diastolic (congestive) heart failure: Secondary | ICD-10-CM | POA: Diagnosis not present

## 2015-08-16 DIAGNOSIS — I2510 Atherosclerotic heart disease of native coronary artery without angina pectoris: Secondary | ICD-10-CM | POA: Diagnosis not present

## 2015-08-16 DIAGNOSIS — K0889 Other specified disorders of teeth and supporting structures: Secondary | ICD-10-CM | POA: Insufficient documentation

## 2015-08-16 HISTORY — PX: MULTIPLE EXTRACTIONS WITH ALVEOLOPLASTY: SHX5342

## 2015-08-16 SURGERY — MULTIPLE EXTRACTION WITH ALVEOLOPLASTY
Anesthesia: General | Site: Mouth

## 2015-08-16 MED ORDER — SUCCINYLCHOLINE CHLORIDE 20 MG/ML IJ SOLN
INTRAMUSCULAR | Status: DC | PRN
  Administered 2015-08-16: 16 mg via INTRAVENOUS

## 2015-08-16 MED ORDER — OXYMETAZOLINE HCL 0.05 % NA SOLN
NASAL | Status: DC | PRN
  Administered 2015-08-16: 1

## 2015-08-16 MED ORDER — LACTATED RINGERS IV SOLN
INTRAVENOUS | Status: DC
  Administered 2015-08-16: 07:00:00 via INTRAVENOUS

## 2015-08-16 MED ORDER — MIDAZOLAM HCL 2 MG/2ML IJ SOLN
INTRAMUSCULAR | Status: AC
  Filled 2015-08-16: qty 2

## 2015-08-16 MED ORDER — HYDROMORPHONE HCL 1 MG/ML IJ SOLN
0.2500 mg | INTRAMUSCULAR | Status: DC | PRN
  Administered 2015-08-16 (×3): 0.5 mg via INTRAVENOUS

## 2015-08-16 MED ORDER — ONDANSETRON HCL 4 MG/2ML IJ SOLN: 4.0000 mg | Freq: Once | INTRAMUSCULAR | Status: DC | PRN

## 2015-08-16 MED ORDER — FENTANYL CITRATE (PF) 250 MCG/5ML IJ SOLN
INTRAMUSCULAR | Status: AC
  Filled 2015-08-16: qty 5

## 2015-08-16 MED ORDER — SODIUM CHLORIDE 0.9 % IR SOLN
Status: DC | PRN
  Administered 2015-08-16: 1000 mL

## 2015-08-16 MED ORDER — ONDANSETRON HCL 4 MG/2ML IJ SOLN
INTRAMUSCULAR | Status: DC | PRN
  Administered 2015-08-16: 4 mg via INTRAVENOUS

## 2015-08-16 MED ORDER — ROCURONIUM BROMIDE 50 MG/5ML IV SOLN
INTRAVENOUS | Status: AC
  Filled 2015-08-16: qty 2

## 2015-08-16 MED ORDER — ONDANSETRON HCL 4 MG/2ML IJ SOLN
INTRAMUSCULAR | Status: AC
  Filled 2015-08-16: qty 4

## 2015-08-16 MED ORDER — LIDOCAINE-EPINEPHRINE 2 %-1:100000 IJ SOLN
INTRAMUSCULAR | Status: DC | PRN
  Administered 2015-08-16: 17 mL via INTRADERMAL

## 2015-08-16 MED ORDER — LIDOCAINE HCL (CARDIAC) 20 MG/ML IV SOLN
INTRAVENOUS | Status: DC | PRN
  Administered 2015-08-16: 50 mg via INTRAVENOUS

## 2015-08-16 MED ORDER — HYDROMORPHONE HCL 1 MG/ML IJ SOLN
INTRAMUSCULAR | Status: AC
  Administered 2015-08-16: 0.5 mg via INTRAVENOUS
  Filled 2015-08-16: qty 1

## 2015-08-16 MED ORDER — EPHEDRINE SULFATE 50 MG/ML IJ SOLN
INTRAMUSCULAR | Status: AC
  Filled 2015-08-16: qty 2

## 2015-08-16 MED ORDER — PHENYLEPHRINE 40 MCG/ML (10ML) SYRINGE FOR IV PUSH (FOR BLOOD PRESSURE SUPPORT)
PREFILLED_SYRINGE | INTRAVENOUS | Status: AC
Start: 2015-08-16 — End: 2015-08-16
  Filled 2015-08-16: qty 20

## 2015-08-16 MED ORDER — 0.9 % SODIUM CHLORIDE (POUR BTL) OPTIME
TOPICAL | Status: DC | PRN
  Administered 2015-08-16: 1000 mL

## 2015-08-16 MED ORDER — GLYCOPYRROLATE 0.2 MG/ML IJ SOLN
INTRAMUSCULAR | Status: AC
  Filled 2015-08-16: qty 6

## 2015-08-16 MED ORDER — SUCCINYLCHOLINE CHLORIDE 20 MG/ML IJ SOLN
INTRAMUSCULAR | Status: AC
  Filled 2015-08-16: qty 2

## 2015-08-16 MED ORDER — PROPOFOL 10 MG/ML IV BOLUS
INTRAVENOUS | Status: AC
  Filled 2015-08-16: qty 20

## 2015-08-16 MED ORDER — OXYCODONE-ACETAMINOPHEN 5-325 MG PO TABS: 1.0000 | ORAL_TABLET | ORAL | Status: DC | PRN

## 2015-08-16 MED ORDER — PROPOFOL 10 MG/ML IV BOLUS
INTRAVENOUS | Status: DC | PRN
  Administered 2015-08-16: 150 mg via INTRAVENOUS

## 2015-08-16 MED ORDER — MIDAZOLAM HCL 5 MG/5ML IJ SOLN
INTRAMUSCULAR | Status: DC | PRN
  Administered 2015-08-16: 2 mg via INTRAVENOUS

## 2015-08-16 MED ORDER — OXYMETAZOLINE HCL 0.05 % NA SOLN
NASAL | Status: AC
  Filled 2015-08-16: qty 15

## 2015-08-16 MED ORDER — MEPERIDINE HCL 25 MG/ML IJ SOLN: 6.2500 mg | INTRAMUSCULAR | Status: DC | PRN

## 2015-08-16 MED ORDER — LIDOCAINE-EPINEPHRINE 2 %-1:100000 IJ SOLN
INTRAMUSCULAR | Status: AC
  Filled 2015-08-16: qty 1

## 2015-08-16 MED ORDER — LIDOCAINE HCL (CARDIAC) 20 MG/ML IV SOLN
INTRAVENOUS | Status: AC
  Filled 2015-08-16: qty 10

## 2015-08-16 MED ORDER — FENTANYL CITRATE (PF) 100 MCG/2ML IJ SOLN
INTRAMUSCULAR | Status: DC | PRN
  Administered 2015-08-16 (×2): 50 ug via INTRAVENOUS
  Administered 2015-08-16: 100 ug via INTRAVENOUS
  Administered 2015-08-16: 50 ug via INTRAVENOUS

## 2015-08-16 MED ORDER — PHENYLEPHRINE HCL 10 MG/ML IJ SOLN
INTRAMUSCULAR | Status: DC | PRN
  Administered 2015-08-16: 80 ug via INTRAVENOUS
  Administered 2015-08-16: 40 ug via INTRAVENOUS

## 2015-08-16 MED ORDER — NEOSTIGMINE METHYLSULFATE 10 MG/10ML IV SOLN
INTRAVENOUS | Status: AC
  Filled 2015-08-16: qty 2

## 2015-08-16 MED ORDER — HYDROMORPHONE HCL 1 MG/ML IJ SOLN
INTRAMUSCULAR | Status: DC
Start: 2015-08-16 — End: 2015-08-16
  Filled 2015-08-16: qty 1

## 2015-08-16 MED ORDER — STERILE WATER FOR INJECTION IJ SOLN
INTRAMUSCULAR | Status: AC
  Filled 2015-08-16: qty 20

## 2015-08-16 MED ORDER — DEXAMETHASONE SODIUM PHOSPHATE 4 MG/ML IJ SOLN
INTRAMUSCULAR | Status: DC | PRN
  Administered 2015-08-16: 8 mg via INTRAVENOUS

## 2015-08-16 MED ORDER — PHENYLEPHRINE 40 MCG/ML (10ML) SYRINGE FOR IV PUSH (FOR BLOOD PRESSURE SUPPORT)
PREFILLED_SYRINGE | INTRAVENOUS | Status: AC
  Filled 2015-08-16: qty 10

## 2015-08-16 SURGICAL SUPPLY — 27 items
BUR CROSS CUT FISSURE 1.6 (BURR) ×2 IMPLANT
BUR EGG ELITE 4.0 (BURR) ×1 IMPLANT
CANISTER SUCTION 2500CC (MISCELLANEOUS) ×2 IMPLANT
COVER SURGICAL LIGHT HANDLE (MISCELLANEOUS) ×2 IMPLANT
CRADLE DONUT ADULT HEAD (MISCELLANEOUS) ×2 IMPLANT
DRAPE U-SHAPE 76X120 STRL (DRAPES) ×2 IMPLANT
FLUID NSS /IRRIG 1000 ML XXX (MISCELLANEOUS) ×2 IMPLANT
GAUZE PACKING FOLDED 2  STR (GAUZE/BANDAGES/DRESSINGS) ×1
GAUZE PACKING FOLDED 2 STR (GAUZE/BANDAGES/DRESSINGS) ×1 IMPLANT
GLOVE BIO SURGEON STRL SZ 6.5 (GLOVE) ×2 IMPLANT
GLOVE BIO SURGEON STRL SZ7.5 (GLOVE) ×2 IMPLANT
GLOVE BIOGEL PI IND STRL 7.0 (GLOVE) ×1 IMPLANT
GLOVE BIOGEL PI INDICATOR 7.0 (GLOVE) ×1
GOWN STRL REUS W/ TWL LRG LVL3 (GOWN DISPOSABLE) ×1 IMPLANT
GOWN STRL REUS W/ TWL XL LVL3 (GOWN DISPOSABLE) ×1 IMPLANT
GOWN STRL REUS W/TWL LRG LVL3 (GOWN DISPOSABLE) ×2
GOWN STRL REUS W/TWL XL LVL3 (GOWN DISPOSABLE) ×2
KIT BASIN OR (CUSTOM PROCEDURE TRAY) ×2 IMPLANT
KIT ROOM TURNOVER OR (KITS) ×2 IMPLANT
NEEDLE 22X1 1/2 (OR ONLY) (NEEDLE) ×3 IMPLANT
NS IRRIG 1000ML POUR BTL (IV SOLUTION) ×2 IMPLANT
PAD ARMBOARD 7.5X6 YLW CONV (MISCELLANEOUS) ×2 IMPLANT
SUT CHROMIC 3 0 PS 2 (SUTURE) ×4 IMPLANT
SYR CONTROL 10ML LL (SYRINGE) ×2 IMPLANT
TRAY ENT MC OR (CUSTOM PROCEDURE TRAY) ×2 IMPLANT
TUBING IRRIGATION (MISCELLANEOUS) ×2 IMPLANT
YANKAUER SUCT BULB TIP NO VENT (SUCTIONS) ×2 IMPLANT

## 2015-08-16 NOTE — Op Note (Signed)
08/16/2015  9:38 AM  PATIENT:  [name]  60 y.o. female  PRE-OPERATIVE DIAGNOSIS:  non restorable teeth 3, 6, 8, 9, 11, 14, 15, 28, BILATERAL MANDIBULAR LINGUAL TORI  POST-OPERATIVE DIAGNOSIS:  SAME  PROCEDURE:  Procedure(s): EXTRACTION OF TEETH THREE, SIX, EIGHT, NINE, ELEVEN, FOURTEEN, FIFTEEN, TWENTY-EIGHT, REMOVAL BILATERAL MANDIBULAR LINGUAL TORI  SURGEON:  Surgeon(s): Diona Browner, DDS  ANESTHESIA:   local and general  EBL:  minimal  DRAINS: none   SPECIMEN:  No Specimen  COUNTS:  YES  PLAN OF CARE: Discharge to home after PACU  PATIENT DISPOSITION:  PACU - hemodynamically stable.   PROCEDURE DETAILS: Dictation # [id number]  Gae Bon, DMD 08/16/2015 9:38 AM

## 2015-08-16 NOTE — Op Note (Signed)
NAME:  Allison Sharp, Allison Sharp NO.:  [id number]  MEDICAL RECORD NO.:  [id number]  LOCATION:  [facility]                         FACILITY:  [facility]  PHYSICIAN:  Gae Bon, M.D.  DATE OF BIRTH:  [birth year]  DATE OF PROCEDURE:  08/16/2015 DATE OF DISCHARGE:                              OPERATIVE REPORT   PREOPERATIVE DIAGNOSIS:  Nonrestorable teeth secondary to dental caries and periodontal disease, numbers 3, 6, 8, 9, 11, 14, 15, 28.  Bilateral mandibular lingual tori.  POSTOPERATIVE DIAGNOSIS:  Nonrestorable teeth secondary to dental caries and periodontal disease, numbers 3, 6, 8, 9, 11, 14, 15, 28.  Bilateral mandibular lingual tori.  PROCEDURE:  Extraction of teeth numbers 3, 6, 8, 9, 11, 14, 15, 28. Removal of bilateral mandibular lingual tori.  SURGEON:  Gae Bon, M.D.  ANESTHESIA:  General nasal intubation, Dr. Conrad Dickenson attending.  ESTIMATED BLOOD LOSS:  Minimal.  COMPLICATIONS:  None.  SPECIMENS:  None.  PROCEDURE IN DETAIL:  The patient was taken to the operating room, placed on the table in a supine position.  General anesthesia was administered, and a nasal endotracheal tube was placed and secured.  The eyes were protected, and the patient was draped for the procedure.  A time-out was performed.  The posterior pharynx was suctioned.  A throat pack was placed.  A 2% lidocaine with 1:100,000 epinephrine was infiltrated in the inferior alveolar block on the right and left sides and in buccal and palatal infiltration around the teeth, to be removed in the maxilla.  A total of 17 mL was utilized.  A bite block was placed in the right side of the mouth, and a sweetheart retractor was used to retract the tongue.  A #15 blade was used to make a crestal incision of approximately 2 cm long the left posterior mandibular area.  This was carried forward to tooth #22 and lingually around this tooth; and the periosteum was reflected lingually, retracted with a  Seldin retractor; and then the egg-shaped burr and bone file were used to remove the lingual torus on the left side.  Then, the area was irrigated and closed with 3-0 chromic.  Then, in the maxilla, a 15 blade was used to make an incision around the teeth numbers 8, 9, 11, 14, and 15.  The periosteum was reflected from around these teeth.  The teeth were elevated with a 301 elevator and removed from the mouth with a dental forceps.  The sockets were curetted, irrigated, and closed with 3-0 chromic.  The bite block and sweetheart retractor were repositioned to the other side of the mouth, and a 15 blade was used to make an incision around teeth numbers 3, 6, and 28.  In the area of tooth #28, the crestal incision was carried approximately 2 cm along the crest of the ridge.  The periosteum was reflected lingually and tooth #28 was removed with a 301 elevator and the Asch forceps.  Then, the periosteum was further reflected lingually to expose the mandibular torus.  This was removed using the egg-shaped burr and bone file and the Seldin retractor for protection of the lingual tissues.  The area was then irrigated  and closed with 3-0 chromic.  Then, the 301 elevator and the upper universal forceps were used to extract teeth numbers 3 and 6.  These sockets were also irrigated and closed with 3-0 chromic. Then, the oral cavity was inspected, found to have good contour hemostasis and closure.  The oral cavity was irrigated and suctioned. The throat pack was removed.  The patient was then left in the care of the anesthesiologist and nurse anesthetist.     Gae Bon, M.D.     SMJ/MEDQ  D:  08/16/2015  T:  08/16/2015  Job:  [id number]

## 2015-08-16 NOTE — Anesthesia Preprocedure Evaluation (Signed)
Anesthesia Evaluation  Patient identified by MRN, date of birth, ID band Patient awake    Reviewed: Allergy & Precautions  History of Anesthesia Complications (+) PONV  Airway Mallampati: I  TM Distance: >3 FB Neck ROM: Full    Dental   Pulmonary COPD,    Pulmonary exam normal        Cardiovascular hypertension, Pt. on medications + CAD and + Past MI  Normal cardiovascular exam     Neuro/Psych Anxiety Depression Bipolar Disorder    GI/Hepatic GERD  Medicated and Controlled,  Endo/Other    Renal/GU      Musculoskeletal   Abdominal   Peds  Hematology   Anesthesia Other Findings   Reproductive/Obstetrics                             Anesthesia Physical Anesthesia Plan  ASA: III  Anesthesia Plan: General   Post-op Pain Management:    Induction: Intravenous  Airway Management Planned: Nasal ETT  Additional Equipment:   Intra-op Plan:   Post-operative Plan: Extubation in OR  Informed Consent: I have reviewed the patients History and Physical, chart, labs and discussed the procedure including the risks, benefits and alternatives for the proposed anesthesia with the patient or authorized representative who has indicated his/her understanding and acceptance.     Plan Discussed with: CRNA and Surgeon  Anesthesia Plan Comments:         Anesthesia Quick Evaluation

## 2015-08-16 NOTE — Anesthesia Postprocedure Evaluation (Signed)
Anesthesia Post Note  Patient: Allison Sharp  Procedure(s) Performed: Procedure(s) (LRB): EXTRACTION OF TEETH THREE, SIX, EIGHT, NINE, ELEVEN, FOURTEEN, FIFTEEN, TWENTY-EIGHT WITH ALVEOLOPLASTY (N/A)  Patient location during evaluation: PACU Anesthesia Type: General Level of consciousness: awake and alert Pain management: pain level controlled Vital Signs Assessment: post-procedure vital signs reviewed and stable Respiratory status: spontaneous breathing, nonlabored ventilation and respiratory function stable Cardiovascular status: blood pressure returned to baseline and stable Postop Assessment: no signs of nausea or vomiting Anesthetic complications: no    Last Vitals:  Filed Vitals:   08/16/15 1146 08/16/15 1209  BP: 124/66 134/85  Pulse: 86 90  Temp:    Resp: 11 16    Last Pain:  Filed Vitals:   08/16/15 1239  PainSc: 4                  [name] A

## 2015-08-16 NOTE — Anesthesia Procedure Notes (Signed)
Procedure Name: Intubation Date/Time: 08/16/2015 9:11 AM Performed by: Shirlyn Goltz Pre-anesthesia Checklist: Patient identified, Emergency Drugs available, Suction available and Patient being monitored Patient Re-evaluated:Patient Re-evaluated prior to inductionOxygen Delivery Method: Circle system utilized Preoxygenation: Pre-oxygenation with 100% oxygen Intubation Type: IV induction Ventilation: Mask ventilation without difficulty Laryngoscope Size: Mac and 3 Grade View: Grade I Nasal Tubes: Right and Nasal Rae Tube size: 7.0 mm Number of attempts: 1 Airway Equipment and Method: Stylet Placement Confirmation: ETT inserted through vocal cords under direct vision,  positive ETCO2 and breath sounds checked- equal and bilateral Tube secured with: Tape Dental Injury: Teeth and Oropharynx as per pre-operative assessment

## 2015-08-16 NOTE — Transfer of Care (Signed)
Immediate Anesthesia Transfer of Care Note  Patient: Allison Sharp  Procedure(s) Performed: Procedure(s): EXTRACTION OF TEETH THREE, SIX, EIGHT, NINE, ELEVEN, FOURTEEN, FIFTEEN, TWENTY-EIGHT WITH ALVEOLOPLASTY (N/A)  Patient Location: PACU  Anesthesia Type:General  Level of Consciousness: awake, alert , oriented and patient cooperative  Airway & Oxygen Therapy: Patient Spontanous Breathing and Patient connected to face mask oxygen  Post-op Assessment: Report given to RN, Post -op Vital signs reviewed and stable, Patient moving all extremities and Patient moving all extremities X 4  Post vital signs: Reviewed and stable  Last Vitals:  Filed Vitals:   08/16/15 0716 08/16/15 0959  BP: 131/61 107/82  Pulse: 82 103  Temp: 36.7 C 36.4 C  Resp: 18 16    Complications: No apparent anesthesia complications

## 2015-08-16 NOTE — H&P (Signed)
H&P documentation  -History and Physical Reviewed  -Patient has been re-examined  -No change in the plan of care  Allison Sharp M  

## 2015-08-17 ENCOUNTER — Encounter (HOSPITAL_COMMUNITY): Payer: Self-pay | Admitting: Oral Surgery

## 2015-08-19 NOTE — Telephone Encounter (Signed)
Pt scheduled for 08/31/15

## 2015-08-30 ENCOUNTER — Ambulatory Visit: Payer: Self-pay | Admitting: Pharmacist

## 2015-08-31 ENCOUNTER — Ambulatory Visit (INDEPENDENT_AMBULATORY_CARE_PROVIDER_SITE_OTHER): Payer: Medicare Other | Admitting: Pulmonary Disease

## 2015-08-31 ENCOUNTER — Encounter: Payer: Self-pay | Admitting: Pulmonary Disease

## 2015-08-31 ENCOUNTER — Other Ambulatory Visit (INDEPENDENT_AMBULATORY_CARE_PROVIDER_SITE_OTHER): Payer: Medicare Other

## 2015-08-31 VITALS — BP 122/82 | HR 98 | Ht 69.0 in | Wt 367.0 lb

## 2015-08-31 DIAGNOSIS — G4733 Obstructive sleep apnea (adult) (pediatric): Secondary | ICD-10-CM | POA: Diagnosis not present

## 2015-08-31 DIAGNOSIS — J4530 Mild persistent asthma, uncomplicated: Secondary | ICD-10-CM | POA: Diagnosis not present

## 2015-08-31 DIAGNOSIS — R0902 Hypoxemia: Secondary | ICD-10-CM | POA: Diagnosis not present

## 2015-08-31 LAB — CBC WITH DIFFERENTIAL/PLATELET
BASOS PCT: 0.1 % (ref 0.0–3.0)
Basophils Absolute: 0 10*3/uL (ref 0.0–0.1)
EOS ABS: 0.2 10*3/uL (ref 0.0–0.7)
Eosinophils Relative: 1.4 % (ref 0.0–5.0)
HCT: 40.2 % (ref 36.0–46.0)
HEMOGLOBIN: 13.7 g/dL (ref 12.0–15.0)
Lymphocytes Relative: 20.9 % (ref 12.0–46.0)
Lymphs Abs: 2.5 10*3/uL (ref 0.7–4.0)
MCHC: 34.2 g/dL (ref 30.0–36.0)
MCV: 90.2 fl (ref 78.0–100.0)
MONO ABS: 0.7 10*3/uL (ref 0.1–1.0)
Monocytes Relative: 5.6 % (ref 3.0–12.0)
Neutro Abs: 8.7 10*3/uL — ABNORMAL HIGH (ref 1.4–7.7)
Neutrophils Relative %: 72 % (ref 43.0–77.0)
PLATELETS: 233 10*3/uL (ref 150.0–400.0)
RBC: 4.46 Mil/uL (ref 3.87–5.11)
RDW: 13.2 % (ref 11.5–15.5)
WBC: 12.1 10*3/uL — AB (ref 4.0–10.5)

## 2015-08-31 NOTE — Patient Instructions (Signed)
We'll schedule you for full pulmonary function tests with a bronchodilator response. We'll send blood work to check CBC with differential, IgE levels.  Return to clinic in 1-2 months.

## 2015-08-31 NOTE — Progress Notes (Signed)
Subjective:    Patient ID: Allison Sharp, female    DOB: [birth year], 60 y.o.   MRN: [id number]  HPI Consult for evaluation of dyspnea.  Allison Sharp is 60 year old with dyspnea on exertion for several years. She is noticing worsening over the past 1-2 months. She has occasional wheezing. No cough, sputum production. She has a diagnosis of asthma, COPD. She was seen by Dr. Halford Chessman in our office in 2010. A workup at that time including PFTs, methacholine challenge test and a CT scan was normal. She since had a repeat PFTs in 2012 which showed slightly worse restriction, DLCO impairment. A CT in 2016 showed normal lungs.  She is currently on Breo and albuterol which did not help with the symptoms. She has history of GERD and is on PPI. She has OSA diagnosed many years ago and feels that the CPAP is not working any more. She had repeat sleep study last month and has new CPAP and sleep follow up already arranged.   DATA: PFT 03/05/09 FEV1 2.77(98%), FVC 3.25(86%), FEV1% 85, TLC 5.88(99%), DLCO 60% PFTs 08/08/10 FEV1 2.22 (76%), FVC 2.76 (78%), FEV1% 82, TLC 4.46 (75%), DLCO 58% Methacholine challenge 03/16/09 normal CT chest 03/12/09 No pulmonary disease CT chest 03/04/15 No pulmonary disease  Social History: She's never smoker but exposed to secondhand smoke. No alcohol, drug use. 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  Family history: Emphysema-father Heart disease-mother, grandmother.  Past Medical History  Diagnosis Date  . Hyperlipidemia   . CAD (coronary artery disease)   . Bipolar 1 disorder ([place])   . GERD (gastroesophageal reflux disease)   . Hyperthyroidism   . Chronic back pain   . Dyspnea     PFT 03/05/09 FEV1 2.77(98%), FVC 3.25(86%), FEV1% 85, TLC 5.88(99%), DLCO 60% ,  Methacholine challenge 03/16/09 normal ,  CT chest 03/12/09 no pulmonary disease  . Anxiety   . Arthritis   . Depression   . OSA on CPAP     2 liters  . HTN (hypertension)   .  History of colonoscopy 10/17/2002    by Dr Rehman-> distal non-specific proctitis, small ext hemorrhoids,   . Fungal infection   . Cellulitis   . Contusion of sacrum   . Migraine headache   . Vitamin D deficiency   . Sleep apnea   . Myocardial infarction ([place])     NOV 1997  . Hypothyroidism     States she only has hyperthyroidism  . Complication of anesthesia     States she typically gets sick s/p anesthesia  . Morbid obesity with body mass index of 50.0-59.9 in adult [facility] JAN 2011 370 LBS    2004 311 BMI 45.9  . PTSD (post-traumatic stress disorder)   . Asthma   . Allergy   . Urine incontinence   . Chronic headaches   . Suicidal ideation   . COPD (chronic obstructive pulmonary disease) ([place])   . CHF (congestive heart failure) ([facility])     diastolic dysfunction  . PONV (postoperative nausea and vomiting)     Current outpatient prescriptions:  .  albuterol (PROVENTIL HFA;VENTOLIN HFA) 108 (90 BASE) MCG/ACT inhaler, Inhale 2 puffs into the lungs every 4 (four) hours as needed for wheezing or shortness of breath., Disp: 1 Inhaler, Rfl: 0 .  albuterol (PROVENTIL) (2.5 MG/3ML) 0.083% nebulizer solution, Take 3 mLs (2.5 mg total) by nebulization every 4 (four) hours as needed for wheezing or shortness of breath., Disp:  75 mL, Rfl: 0 .  amoxicillin (AMOXIL) 500 MG capsule, Take 1 capsule by mouth daily. BEFORE DENTAL PROCEDURES, Disp: , Rfl:  .  atorvastatin (LIPITOR) 20 MG tablet, Take 1 tablet (20 mg total) by mouth daily., Disp: 90 tablet, Rfl: 3 .  cetirizine (ZYRTEC) 10 MG tablet, Take 1 tablet (10 mg total) by mouth daily., Disp: 30 tablet, Rfl: 3 .  cloNIDine (CATAPRES) 0.1 MG tablet, TAKE ONE TABLET AT BEDTIME, Disp: 30 tablet, Rfl: 5 .  Clotrimazole 1 % OINT, Apply 1 application topically 2 (two) times daily as needed (SKIN RASHES)., Disp: , Rfl:  .  diclofenac sodium (VOLTAREN) 1 % GEL, Apply to affected area twice daily as needed, Disp: 100 g, Rfl: 0 .  divalproex (DEPAKOTE) 500  MG DR tablet, Take 2 tablets (1,000 mg total) by mouth at bedtime., Disp: 60 tablet, Rfl: 1 .  doxepin (SINEQUAN) 25 MG capsule, Take 1 capsule (25 mg total) by mouth daily., Disp: 30 capsule, Rfl: 1 .  DULoxetine (CYMBALTA) 60 MG capsule, Take 1 capsule (60 mg total) by mouth daily., Disp: 30 capsule, Rfl: 0 .  fluticasone (FLONASE) 50 MCG/ACT nasal spray, Place 1 spray into both nostrils 2 (two) times daily as needed for allergies or rhinitis., Disp: 16 g, Rfl: 6 .  fluticasone furoate-vilanterol (BREO ELLIPTA) 100-25 MCG/INH AEPB, Inhale 1 puff into the lungs daily., Disp: 1 each, Rfl: 2 .  levothyroxine (SYNTHROID, LEVOTHROID) 112 MCG tablet, Take 1 tablet (112 mcg total) by mouth daily before breakfast. For hypothyroidism., Disp: 90 tablet, Rfl: 2 .  lisinopril-hydrochlorothiazide (PRINZIDE,ZESTORETIC) 20-25 MG tablet, Take 1 tablet by mouth daily., Disp: 30 tablet, Rfl: 4 .  Multiple Vitamin (MULTIVITAMIN WITH MINERALS) TABS, Take 1 tablet by mouth daily. For nutritional supplementation., Disp: 30 tablet, Rfl: 0 .  omeprazole (PRILOSEC) 20 MG capsule, Take 1 capsule (20 mg total) by mouth daily., Disp: 30 capsule, Rfl: 6 .  oxyCODONE-acetaminophen (PERCOCET) 5-325 MG tablet, Take 1-2 tablets by mouth every 4 (four) hours as needed for severe pain., Disp: 40 tablet, Rfl: 0 .  risperiDONE (RISPERDAL) 1 MG tablet, Take 1 tablet (1 mg total) by mouth at bedtime., Disp: 30 tablet, Rfl: 0  Review of Systems Dyspnea on exertion, wheezing. No cough, sputum production, hemoptysis. No chest pain, palpitation. No nausea, vomiting, diarrhea, constipation. No fevers, chills, loss of weight, loss of appetite. All other review of systems are negative.    Objective:   Physical Exam Blood pressure 122/82, pulse 98, height 5\' 9"  (1.753 m), weight 367 lb (166.47 kg), last menstrual period 02/18/1995, SpO2 96 %. Gen: No apparent distress, morbid obesity Neuro: No gross focal deficits. HEENT: No JVD,  lymphadenopathy, thyromegaly. RS: Clear, No wheeze or crackles CVS: S1-S2 heard, no murmurs rubs gallops. Abdomen: Soft, positive bowel sounds. Musculoskeletal: No edema.    Assessment & Plan:  Dyspnea on exertion.  May be secondary to asthma and COPD. She is on breo and albuterol but does not see any change in symptoms. She's had a workup in 2010 which was negative for asthma. She mainly has restriction on PFTs but CT does not show any interstitial abnormalities. I suspect her symptoms are more due to deconditioning, obesity.   I'll get repeat PFTs and blood work with CBC, differential, IgE levels. She has follow-up already arranged for her sleep apnea.  Plan: - PFTs, CBC with diff, IgE levels  Return to clinic in 1-2 months.

## 2015-09-02 LAB — IGE: IgE (Immunoglobulin E), Serum: 7 kU/L (ref <115)

## 2015-09-03 DIAGNOSIS — M75101 Unspecified rotator cuff tear or rupture of right shoulder, not specified as traumatic: Secondary | ICD-10-CM | POA: Diagnosis not present

## 2015-09-03 DIAGNOSIS — M25511 Pain in right shoulder: Secondary | ICD-10-CM | POA: Diagnosis not present

## 2015-09-06 ENCOUNTER — Ambulatory Visit: Payer: Medicare Other | Admitting: Family Medicine

## 2015-09-07 ENCOUNTER — Encounter: Payer: Self-pay | Admitting: Family Medicine

## 2015-09-08 ENCOUNTER — Ambulatory Visit (HOSPITAL_COMMUNITY): Payer: Self-pay | Admitting: Clinical

## 2015-09-09 ENCOUNTER — Telehealth: Payer: Self-pay | Admitting: Family Medicine

## 2015-09-09 ENCOUNTER — Ambulatory Visit (INDEPENDENT_AMBULATORY_CARE_PROVIDER_SITE_OTHER): Payer: Medicare Other | Admitting: Family Medicine

## 2015-09-09 ENCOUNTER — Encounter: Payer: Self-pay | Admitting: Family Medicine

## 2015-09-09 DIAGNOSIS — M7989 Other specified soft tissue disorders: Secondary | ICD-10-CM | POA: Diagnosis not present

## 2015-09-09 MED ORDER — FUROSEMIDE 20 MG PO TABS
20.0000 mg | ORAL_TABLET | Freq: Every day | ORAL | Status: DC | PRN
Start: 2015-09-09 — End: 2015-11-03

## 2015-09-09 MED ORDER — NALTREXONE-BUPROPION HCL ER 8-90 MG PO TB12: 2.0000 | ORAL_TABLET | Freq: Two times a day (BID) | ORAL | Status: DC

## 2015-09-09 NOTE — Progress Notes (Signed)
BP 142/79 mmHg  Pulse 87  Temp(Src) 98.3 F (36.8 C) (Oral)  Ht 5\' 9"  (1.753 m)  Wt 372 lb 9.6 oz (169.01 kg)  BMI 55.00 kg/m2  LMP 02/18/1995   Subjective:    Patient ID: Allison Sharp, female    DOB: February 18, 1956, 60 y.o.   MRN: [id number]  HPI: Allison Sharp is a 60 y.o. female presenting on 09/09/2015 for Discuss weight loss options and Edema in feet and ankles   HPI Morbid obesity and discussed weight loss options Patient comes in today because she has had chronic morbid obesity and would like to discuss weight loss options. She says she knows what she needs to do to change that she just has difficulty doing it. She says stress transferred to eat a lot and currently she has a living with her that is very stressful. She hopes to get that family member out of her house and that she can have less stress in her life. She admits that she does overeat quite a lot. She is not ready to go and discuss surgery options for bariatrics yet would like to try medication first.  Leg swelling bilateral Patient has been having bilateral lower extremity swelling that comes and goes but is especially worse when she is on her legs for prolonged periods of time. The swelling has been a chronic issue for her she just wants to know she can get something better to deal with that. Currently she is not really doing anything for it.  Relevant past medical, surgical, family and social history reviewed and updated as indicated. Interim medical history since our last visit reviewed. Allergies and medications reviewed and updated.  Review of Systems  Constitutional: Negative for fever and chills.  HENT: Negative for congestion, ear discharge and ear pain.   Eyes: Negative for redness and visual disturbance.  Respiratory: Negative for chest tightness and shortness of breath (Much improved from previous).   Cardiovascular: Positive for leg swelling. Negative for chest pain.  Genitourinary: Negative for  dysuria and difficulty urinating.  Musculoskeletal: Negative for back pain and gait problem.  Skin: Negative for rash.  Neurological: Negative for dizziness, light-headedness and headaches.  Psychiatric/Behavioral: Positive for dysphoric mood. Negative for suicidal ideas, behavioral problems, sleep disturbance, self-injury and agitation. The patient is nervous/anxious.   All other systems reviewed and are negative.   Per HPI unless specifically indicated above     Medication List       This list is accurate as of: 09/09/15  1:52 PM.  Always use your most recent med list.               albuterol 108 (90 Base) MCG/ACT inhaler  Commonly known as:  PROVENTIL HFA;VENTOLIN HFA  Inhale 2 puffs into the lungs every 4 (four) hours as needed for wheezing or shortness of breath.     albuterol (2.5 MG/3ML) 0.083% nebulizer solution  Commonly known as:  PROVENTIL  Take 3 mLs (2.5 mg total) by nebulization every 4 (four) hours as needed for wheezing or shortness of breath.     amoxicillin 500 MG capsule  Commonly known as:  AMOXIL  Take 1 capsule by mouth daily. BEFORE DENTAL PROCEDURES     atorvastatin 20 MG tablet  Commonly known as:  LIPITOR  Take 1 tablet (20 mg total) by mouth daily.     cetirizine 10 MG tablet  Commonly known as:  ZYRTEC  Take 1 tablet (10 mg total) by mouth daily.  cloNIDine 0.1 MG tablet  Commonly known as:  CATAPRES  TAKE ONE TABLET AT BEDTIME     Clotrimazole 1 % Oint  Apply 1 application topically 2 (two) times daily as needed (SKIN RASHES).     diclofenac sodium 1 % Gel  Commonly known as:  VOLTAREN  Apply to affected area twice daily as needed     divalproex 500 MG DR tablet  Commonly known as:  DEPAKOTE  Take 2 tablets (1,000 mg total) by mouth at bedtime.     doxepin 25 MG capsule  Commonly known as:  SINEQUAN  Take 1 capsule (25 mg total) by mouth daily.     DULoxetine 60 MG capsule  Commonly known as:  CYMBALTA  Take 1 capsule (60 mg  total) by mouth daily.     fluticasone 50 MCG/ACT nasal spray  Commonly known as:  FLONASE  Place 1 spray into both nostrils 2 (two) times daily as needed for allergies or rhinitis.     fluticasone furoate-vilanterol 100-25 MCG/INH Aepb  Commonly known as:  BREO ELLIPTA  Inhale 1 puff into the lungs daily.     furosemide 20 MG tablet  Commonly known as:  LASIX  Take 1 tablet (20 mg total) by mouth daily as needed.     levothyroxine 112 MCG tablet  Commonly known as:  SYNTHROID, LEVOTHROID  Take 1 tablet (112 mcg total) by mouth daily before breakfast. For hypothyroidism.     lisinopril-hydrochlorothiazide 20-25 MG tablet  Commonly known as:  PRINZIDE,ZESTORETIC  Take 1 tablet by mouth daily.     multivitamin with minerals Tabs tablet  Take 1 tablet by mouth daily. For nutritional supplementation.     Naltrexone-Bupropion HCl ER 8-90 MG Tb12  Take 2 tablets by mouth 2 (two) times daily. 1 tablet twice daily for 1 week, then 2 AM and 1 PM for 1 week, then 2 twice a day     omeprazole 20 MG capsule  Commonly known as:  PRILOSEC  Take 1 capsule (20 mg total) by mouth daily.     oxyCODONE-acetaminophen 5-325 MG tablet  Commonly known as:  PERCOCET  Take 1-2 tablets by mouth every 4 (four) hours as needed for severe pain.     risperiDONE 1 MG tablet  Commonly known as:  RISPERDAL  Take 1 tablet (1 mg total) by mouth at bedtime.           Objective:    BP 142/79 mmHg  Pulse 87  Temp(Src) 98.3 F (36.8 C) (Oral)  Ht 5\' 9"  (1.753 m)  Wt 372 lb 9.6 oz (169.01 kg)  BMI 55.00 kg/m2  LMP 02/18/1995  Wt Readings from Last 3 Encounters:  09/09/15 372 lb 9.6 oz (169.01 kg)  08/31/15 367 lb (166.47 kg)  08/11/15 364 lb 10.3 oz (165.4 kg)    Physical Exam  Constitutional: She is oriented to person, place, and time. She appears well-developed and well-nourished. No distress.  Morbidly obese  Eyes: Conjunctivae and EOM are normal. Pupils are equal, round, and reactive to  light.  Neck: Neck supple. No thyromegaly present.  Cardiovascular: Normal rate, regular rhythm, normal heart sounds and intact distal pulses.   No murmur heard. Pulmonary/Chest: Effort normal and breath sounds normal. No respiratory distress. She has no wheezes.  Musculoskeletal: Normal range of motion. She exhibits edema (Trace pitting edema edema. Patient has very large lower extremities but a lot of it's probably from chronic lymphedema versus obesity). She exhibits no tenderness.  Lymphadenopathy:  She has no cervical adenopathy.  Neurological: She is alert and oriented to person, place, and time. Coordination normal.  Skin: Skin is warm and dry. No rash noted. She is not diaphoretic.  Psychiatric: She has a normal mood and affect. Her behavior is normal. Judgment and thought content normal.  Nursing note and vitals reviewed.   Results for orders placed or performed in visit on 08/31/15  CBC w/Diff  Result Value Ref Range   WBC 12.1 (H) 4.0 - 10.5 K/uL   RBC 4.46 3.87 - 5.11 Mil/uL   Hemoglobin 13.7 12.0 - 15.0 g/dL   HCT 40.2 36.0 - 46.0 %   MCV 90.2 78.0 - 100.0 fl   MCHC 34.2 30.0 - 36.0 g/dL   RDW 13.2 11.5 - 15.5 %   Platelets 233.0 150.0 - 400.0 K/uL   Neutrophils Relative % 72.0 43.0 - 77.0 %   Lymphocytes Relative 20.9 12.0 - 46.0 %   Monocytes Relative 5.6 3.0 - 12.0 %   Eosinophils Relative 1.4 0.0 - 5.0 %   Basophils Relative 0.1 0.0 - 3.0 %   Neutro Abs 8.7 (H) 1.4 - 7.7 K/uL   Lymphs Abs 2.5 0.7 - 4.0 K/uL   Monocytes Absolute 0.7 0.1 - 1.0 K/uL   Eosinophils Absolute 0.2 0.0 - 0.7 K/uL   Basophils Absolute 0.0 0.0 - 0.1 K/uL  IgE  Result Value Ref Range   IgE (Immunoglobulin E), Serum 7 <115 kU/L      Assessment & Plan:   Problem List Items Addressed This Visit      Other   Morbid obesity ([place]) - Primary (Chronic)   Relevant Medications   Naltrexone-Bupropion HCl ER 8-90 MG TB12   Other Relevant Orders   Amb ref to Medical Nutrition Therapy-MNT     Other Visit Diagnoses    Leg swelling        Relevant Medications    furosemide (LASIX) 20 MG tablet    Other Relevant Orders    For home use only DME Other see comment       Follow up plan: Return in about 4 weeks (around 10/07/2015), or if symptoms worsen or fail to improve, for Weight check.  Counseling provided for all of the vaccine components Orders Placed This Encounter  Procedures  . For home use only DME Other see comment  . Amb ref to [place], MD [facility] 09/09/2015, 1:52 PM

## 2015-09-09 NOTE — Telephone Encounter (Signed)
contrave not covered by Ins per pt Copay card to front for pt pick up Pt will call back if copay card doesn't help

## 2015-09-10 DIAGNOSIS — R0902 Hypoxemia: Secondary | ICD-10-CM | POA: Diagnosis not present

## 2015-09-10 DIAGNOSIS — G4733 Obstructive sleep apnea (adult) (pediatric): Secondary | ICD-10-CM | POA: Diagnosis not present

## 2015-09-11 DIAGNOSIS — M7581 Other shoulder lesions, right shoulder: Secondary | ICD-10-CM | POA: Diagnosis not present

## 2015-09-11 DIAGNOSIS — M19011 Primary osteoarthritis, right shoulder: Secondary | ICD-10-CM | POA: Diagnosis not present

## 2015-09-13 ENCOUNTER — Other Ambulatory Visit: Payer: Self-pay | Admitting: Family Medicine

## 2015-09-13 ENCOUNTER — Other Ambulatory Visit (HOSPITAL_COMMUNITY): Payer: Self-pay | Admitting: Psychiatry

## 2015-09-15 ENCOUNTER — Telehealth: Payer: Self-pay | Admitting: Family Medicine

## 2015-09-15 ENCOUNTER — Other Ambulatory Visit (HOSPITAL_COMMUNITY): Payer: Self-pay | Admitting: Psychiatry

## 2015-09-15 MED ORDER — LIRAGLUTIDE -WEIGHT MANAGEMENT 18 MG/3ML ~~LOC~~ SOPN: 3.0000 mg | PEN_INJECTOR | Freq: Every day | SUBCUTANEOUS | Status: DC

## 2015-09-15 NOTE — Telephone Encounter (Signed)
Attempted to send in a different weight loss medication called Saxenda to see if her insurance will cover it

## 2015-09-15 NOTE — Telephone Encounter (Signed)
Patient aware.

## 2015-09-15 NOTE — Telephone Encounter (Signed)
i will check on this Thursday when I do prior authorizations

## 2015-09-16 ENCOUNTER — Telehealth: Payer: Self-pay

## 2015-09-16 DIAGNOSIS — M75111 Incomplete rotator cuff tear or rupture of right shoulder, not specified as traumatic: Secondary | ICD-10-CM | POA: Diagnosis not present

## 2015-09-16 DIAGNOSIS — M25511 Pain in right shoulder: Secondary | ICD-10-CM | POA: Diagnosis not present

## 2015-09-16 NOTE — Telephone Encounter (Signed)
Patient aware.

## 2015-09-16 NOTE — Telephone Encounter (Signed)
Okay thanks for looking into it.  

## 2015-09-16 NOTE — Telephone Encounter (Signed)
I called Allison Sharp about Diet pill needing prior authorization and they said it doesn't need prior auth  All diet pills are a drug exclusion in her plan

## 2015-09-16 NOTE — Telephone Encounter (Signed)
x

## 2015-09-17 ENCOUNTER — Other Ambulatory Visit (HOSPITAL_COMMUNITY): Payer: Self-pay | Admitting: Psychiatry

## 2015-09-17 ENCOUNTER — Other Ambulatory Visit: Payer: Self-pay | Admitting: Family Medicine

## 2015-09-21 ENCOUNTER — Telehealth (HOSPITAL_COMMUNITY): Payer: Self-pay

## 2015-09-21 DIAGNOSIS — F3160 Bipolar disorder, current episode mixed, unspecified: Secondary | ICD-10-CM

## 2015-09-21 MED ORDER — RISPERIDONE 1 MG PO TABS: 1.0000 mg | ORAL_TABLET | Freq: Every day | ORAL | Status: DC

## 2015-09-21 NOTE — Telephone Encounter (Signed)
Per Dr. Adele Schilder order was sent to pharmacy for one month

## 2015-09-21 NOTE — Telephone Encounter (Signed)
Fax from patients pharmacy for a refill on Risperdone, patient has a follow up on 4/27, medication last filled on 2/21 for 30 days. Please review and advise, thank you

## 2015-09-22 ENCOUNTER — Ambulatory Visit (HOSPITAL_COMMUNITY): Payer: Self-pay | Admitting: Clinical

## 2015-09-23 ENCOUNTER — Ambulatory Visit (HOSPITAL_COMMUNITY): Payer: Self-pay | Admitting: Psychiatry

## 2015-09-28 ENCOUNTER — Ambulatory Visit: Payer: Medicare Other | Admitting: Family Medicine

## 2015-10-04 ENCOUNTER — Ambulatory Visit: Payer: Medicare Other | Admitting: Family Medicine

## 2015-10-04 ENCOUNTER — Ambulatory Visit: Payer: Self-pay | Admitting: Pulmonary Disease

## 2015-10-05 ENCOUNTER — Ambulatory Visit (HOSPITAL_COMMUNITY)
Admission: RE | Admit: 2015-10-05 | Discharge: 2015-10-05 | Disposition: A | Discharge status: Home / Self Care | Payer: Medicare Other | Source: Ambulatory Visit | Attending: Pulmonary Disease | Admitting: Pulmonary Disease

## 2015-10-05 ENCOUNTER — Encounter: Payer: Self-pay | Admitting: Pulmonary Disease

## 2015-10-05 ENCOUNTER — Ambulatory Visit (INDEPENDENT_AMBULATORY_CARE_PROVIDER_SITE_OTHER): Payer: Medicare Other | Admitting: Pulmonary Disease

## 2015-10-05 VITALS — BP 140/80 | HR 90 | Ht 69.0 in | Wt 370.6 lb

## 2015-10-05 DIAGNOSIS — J4530 Mild persistent asthma, uncomplicated: Secondary | ICD-10-CM | POA: Diagnosis not present

## 2015-10-05 LAB — PULMONARY FUNCTION TEST
DL/VA % PRED: 87 %
DL/VA: 4.66 ml/min/mmHg/L
DLCO UNC % PRED: 71 %
DLCO unc: 22.24 ml/min/mmHg
FEF 25-75 POST: 4.2 L/s
FEF 25-75 PRE: 3.78 L/s
FEF2575-%CHANGE-POST: 11 %
FEF2575-%PRED-POST: 162 %
FEF2575-%PRED-PRE: 146 %
FEV1-%Change-Post: 3 %
FEV1-%PRED-POST: 95 %
FEV1-%Pred-Pre: 92 %
FEV1-Post: 2.87 L
FEV1-Pre: 2.78 L
FEV1FVC-%Change-Post: 1 %
FEV1FVC-%PRED-PRE: 110 %
FEV6-%CHANGE-POST: 2 %
FEV6-%PRED-POST: 87 %
FEV6-%Pred-Pre: 84 %
FEV6-Post: 3.28 L
FEV6-Pre: 3.19 L
FEV6FVC-%PRED-PRE: 104 %
FEV6FVC-%Pred-Post: 104 %
FVC-%CHANGE-POST: 1 %
FVC-%Pred-Post: 84 %
FVC-%Pred-Pre: 82 %
FVC-Post: 3.28 L
FVC-Pre: 3.23 L
POST FEV1/FVC RATIO: 88 %
PRE FEV1/FVC RATIO: 86 %
Post FEV6/FVC ratio: 100 %
Pre FEV6/FVC Ratio: 100 %
RV % pred: 85 %
RV: 1.94 L
TLC % pred: 89 %
TLC: 5.22 L

## 2015-10-05 MED ORDER — ALBUTEROL SULFATE (2.5 MG/3ML) 0.083% IN NEBU
2.5000 mg | INHALATION_SOLUTION | Freq: Once | RESPIRATORY_TRACT | Status: AC
  Administered 2015-10-05: 2.5 mg via RESPIRATORY_TRACT

## 2015-10-05 NOTE — Patient Instructions (Signed)
Continue using inhalers as prescribed.  Return to clinic in 6 months.

## 2015-10-05 NOTE — Progress Notes (Signed)
Subjective:    Patient ID: Allison Sharp, female    DOB: August 29, 1955, 60 y.o.   MRN: [id number]  HPI Follow up for evaluation of dyspnea.  Allison Sharp is 60 year old with dyspnea on exertion for several years. She is noticing worsening over the past 1-2 months. She has occasional wheezing. No cough, sputum production. She has a diagnosis of asthma, COPD. She was seen by Dr. Halford Chessman in our office in 2010. A workup at that time including PFTs, methacholine challenge test and a CT scan was normal. She since had a repeat PFTs in 2012 which showed slightly worse restriction, DLCO impairment. A CT in 2016 showed normal lungs.  She is currently on Breo and albuterol which did not help with the symptoms. She has history of GERD and is on PPI. She has OSA diagnosed many years ago and feels that the CPAP is not working any more. She had repeat sleep study last month and has new CPAP which she is using regularly.   DATA: PFT 03/05/09 FEV1 2.77(98%), FVC 3.25(86%), FEV1% 85, TLC 5.88(99%), DLCO 60% PFTs 08/08/10 FEV1 2.22 (76%), FVC 2.76 (78%), FEV1% 82, TLC 4.46 (75%), DLCO 58% Methacholine challenge 03/16/09 normal CT chest 03/12/09 No pulmonary disease CT chest 03/04/15 No pulmonary disease  PFTs 10/05/15 FVC 3.23 (82%) FEV1 2.78 (72%] F/F 86 TLC 89% DLCO 71% No obstruction or restriction. Mild diffusion defect which corrects for alveolar volume.  CBC 08/31/15- WBC 12.7, no eosinophilia IgE- 7  Social History: She's never smoker but exposed to secondhand smoke. No alcohol, drug use. 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  Family history: Emphysema-father Heart disease-mother, grandmother.  Past Medical History  Diagnosis Date  . Hyperlipidemia   . CAD (coronary artery disease)   . Bipolar 1 disorder ([place])   . GERD (gastroesophageal reflux disease)   . Hyperthyroidism   . Chronic back pain   . Dyspnea     PFT 03/05/09 FEV1 2.77(98%), FVC 3.25(86%), FEV1% 85, TLC  5.88(99%), DLCO 60% ,  Methacholine challenge 03/16/09 normal ,  CT chest 03/12/09 no pulmonary disease  . Anxiety   . Arthritis   . Depression   . OSA on CPAP     2 liters  . HTN (hypertension)   . History of colonoscopy 10/17/2002    by Dr Rehman-> distal non-specific proctitis, small ext hemorrhoids,   . Fungal infection   . Cellulitis   . Contusion of sacrum   . Migraine headache   . Vitamin D deficiency   . Sleep apnea   . Myocardial infarction ([place])     NOV 1997  . Hypothyroidism     States she only has hyperthyroidism  . Complication of anesthesia     States she typically gets sick s/p anesthesia  . Morbid obesity with body mass index of 50.0-59.9 in adult [facility]) JAN 2011 370 LBS    2004 311 BMI 45.9  . PTSD (post-traumatic stress disorder)   . Asthma   . Allergy   . Urine incontinence   . Chronic headaches   . Suicidal ideation   . COPD (chronic obstructive pulmonary disease) ([place])   . CHF (congestive heart failure) ([facility])     diastolic dysfunction  . PONV (postoperative nausea and vomiting)     Current outpatient prescriptions:  .  albuterol (PROVENTIL HFA;VENTOLIN HFA) 108 (90 BASE) MCG/ACT inhaler, Inhale 2 puffs into the lungs every 4 (four) hours as needed for wheezing or shortness of  breath., Disp: 1 Inhaler, Rfl: 0 .  albuterol (PROVENTIL) (2.5 MG/3ML) 0.083% nebulizer solution, Take 3 mLs (2.5 mg total) by nebulization every 4 (four) hours as needed for wheezing or shortness of breath., Disp: 75 mL, Rfl: 0 .  atorvastatin (LIPITOR) 20 MG tablet, Take 1 tablet (20 mg total) by mouth daily., Disp: 90 tablet, Rfl: 3 .  cetirizine (ZYRTEC) 10 MG tablet, Take 1 tablet (10 mg total) by mouth daily., Disp: 30 tablet, Rfl: 3 .  cloNIDine (CATAPRES) 0.1 MG tablet, TAKE ONE TABLET AT BEDTIME, Disp: 30 tablet, Rfl: 5 .  Clotrimazole 1 % OINT, Apply 1 application topically 2 (two) times daily as needed (SKIN RASHES)., Disp: , Rfl:  .  diclofenac sodium (VOLTAREN) 1 % GEL,  Apply to affected area twice daily as needed, Disp: 100 g, Rfl: 0 .  divalproex (DEPAKOTE) 500 MG DR tablet, Take 2 tablets (1,000 mg total) by mouth at bedtime., Disp: 60 tablet, Rfl: 1 .  doxepin (SINEQUAN) 25 MG capsule, TAKE (1) CAPSULE DAILY, Disp: 30 capsule, Rfl: 0 .  DULoxetine (CYMBALTA) 60 MG capsule, TAKE (1) CAPSULE DAILY, Disp: 30 capsule, Rfl: 0 .  fluticasone (FLONASE) 50 MCG/ACT nasal spray, Place 1 spray into both nostrils 2 (two) times daily as needed for allergies or rhinitis., Disp: 16 g, Rfl: 6 .  fluticasone furoate-vilanterol (BREO ELLIPTA) 100-25 MCG/INH AEPB, Inhale 1 puff into the lungs daily., Disp: 1 each, Rfl: 2 .  furosemide (LASIX) 20 MG tablet, Take 1 tablet (20 mg total) by mouth daily as needed., Disp: 30 tablet, Rfl: 1 .  levothyroxine (SYNTHROID, LEVOTHROID) 112 MCG tablet, Take 1 tablet (112 mcg total) by mouth daily before breakfast. For hypothyroidism., Disp: 90 tablet, Rfl: 2 .  Liraglutide -Weight Management (SAXENDA) 18 MG/3ML SOPN, Inject 3 mg into the skin daily., Disp: 15 mL, Rfl: 1 .  lisinopril-hydrochlorothiazide (PRINZIDE,ZESTORETIC) 20-25 MG tablet, Take 1 tablet by mouth daily., Disp: 30 tablet, Rfl: 5 .  Multiple Vitamin (MULTIVITAMIN WITH MINERALS) TABS, Take 1 tablet by mouth daily. For nutritional supplementation., Disp: 30 tablet, Rfl: 0 .  Naltrexone-Bupropion HCl ER 8-90 MG TB12, Take 2 tablets by mouth 2 (two) times daily. 1 tablet twice daily for 1 week, then 2 AM and 1 PM for 1 week, then 2 twice a day, Disp: 120 tablet, Rfl: 0 .  omeprazole (PRILOSEC) 20 MG capsule, TAKE (1) CAPSULE DAILY, Disp: 30 capsule, Rfl: 5 .  risperiDONE (RISPERDAL) 1 MG tablet, Take 1 tablet (1 mg total) by mouth at bedtime., Disp: 30 tablet, Rfl: 0 .  amoxicillin (AMOXIL) 500 MG capsule, Take 1 capsule by mouth daily. Reported on 10/05/2015, Disp: , Rfl:  .  oxyCODONE-acetaminophen (PERCOCET) 5-325 MG tablet, Take 1-2 tablets by mouth every 4 (four) hours as needed  for severe pain. (Patient not taking: Reported on 10/05/2015), Disp: 40 tablet, Rfl: 0  Review of Systems Dyspnea on exertion, wheezing. No cough, sputum production, hemoptysis. No chest pain, palpitation. No nausea, vomiting, diarrhea, constipation. No fevers, chills, loss of weight, loss of appetite. All other review of systems are negative.    Objective:   Physical Exam Blood pressure 140/80, pulse 90, height 5\' 9"  (1.753 m), weight 370 lb 9.6 oz (168.103 kg), last menstrual period 02/18/1995, SpO2 95 %. Gen: No apparent distress, morbid obesity Neuro: No gross focal deficits. HEENT: No JVD, lymphadenopathy, thyromegaly. RS: Clear, No wheeze or crackles CVS: S1-S2 heard, no murmurs rubs gallops. Abdomen: Soft, positive bowel sounds. Musculoskeletal: No edema.  Assessment & Plan:  Dyspnea on exertion. She has a diagnosis of asthma and COPD but workup has been unremarkable. I reviewed her PFTs with her today, they are normal except for mild reduction in diffusion capacity which corrects for alveolar volume. She is on breo and albuterol but does not see any change in symptoms. She's had a workup in 2010 which was negative for metahcholine challenge and CT does not show any interstitial abnormalities. I suspect her symptoms are more due to deconditioning, obesity.   I asked her to work on an exercise program and weight loss. She will also keep using her CPAP more regularly and arrange a follow up with her cardiologist for eval of possible cardiac etiology of dyspnea.  Return in 6 months.  Marshell Garfinkel MD [facility] Pager [phone] If no answer or after 3pm call: [phone] 10/05/2015, 12:02 PM

## 2015-10-07 ENCOUNTER — Ambulatory Visit: Payer: Medicare Other | Admitting: Family Medicine

## 2015-10-08 ENCOUNTER — Other Ambulatory Visit: Payer: Self-pay | Admitting: Family Medicine

## 2015-10-10 DIAGNOSIS — R0902 Hypoxemia: Secondary | ICD-10-CM | POA: Diagnosis not present

## 2015-10-10 DIAGNOSIS — G4733 Obstructive sleep apnea (adult) (pediatric): Secondary | ICD-10-CM | POA: Diagnosis not present

## 2015-10-12 ENCOUNTER — Other Ambulatory Visit (HOSPITAL_COMMUNITY): Payer: Self-pay | Admitting: Psychiatry

## 2015-10-12 DIAGNOSIS — M169 Osteoarthritis of hip, unspecified: Secondary | ICD-10-CM | POA: Diagnosis not present

## 2015-10-12 DIAGNOSIS — E7800 Pure hypercholesterolemia, unspecified: Secondary | ICD-10-CM | POA: Diagnosis not present

## 2015-10-12 DIAGNOSIS — E079 Disorder of thyroid, unspecified: Secondary | ICD-10-CM | POA: Diagnosis not present

## 2015-10-12 DIAGNOSIS — Z6841 Body Mass Index (BMI) 40.0 and over, adult: Secondary | ICD-10-CM | POA: Diagnosis not present

## 2015-10-12 DIAGNOSIS — M19011 Primary osteoarthritis, right shoulder: Secondary | ICD-10-CM | POA: Diagnosis not present

## 2015-10-12 DIAGNOSIS — G4730 Sleep apnea, unspecified: Secondary | ICD-10-CM | POA: Diagnosis not present

## 2015-10-12 DIAGNOSIS — K219 Gastro-esophageal reflux disease without esophagitis: Secondary | ICD-10-CM | POA: Diagnosis not present

## 2015-10-12 DIAGNOSIS — G8918 Other acute postprocedural pain: Secondary | ICD-10-CM | POA: Diagnosis not present

## 2015-10-12 DIAGNOSIS — F319 Bipolar disorder, unspecified: Secondary | ICD-10-CM | POA: Diagnosis not present

## 2015-10-12 DIAGNOSIS — J4540 Moderate persistent asthma, uncomplicated: Secondary | ICD-10-CM | POA: Diagnosis not present

## 2015-10-12 DIAGNOSIS — M1710 Unilateral primary osteoarthritis, unspecified knee: Secondary | ICD-10-CM | POA: Diagnosis not present

## 2015-10-12 DIAGNOSIS — I10 Essential (primary) hypertension: Secondary | ICD-10-CM | POA: Diagnosis not present

## 2015-10-12 DIAGNOSIS — M7541 Impingement syndrome of right shoulder: Secondary | ICD-10-CM | POA: Diagnosis not present

## 2015-10-12 DIAGNOSIS — F0390 Unspecified dementia without behavioral disturbance: Secondary | ICD-10-CM | POA: Diagnosis not present

## 2015-10-12 DIAGNOSIS — Z7982 Long term (current) use of aspirin: Secondary | ICD-10-CM | POA: Diagnosis not present

## 2015-10-12 DIAGNOSIS — M545 Low back pain: Secondary | ICD-10-CM | POA: Diagnosis not present

## 2015-10-12 DIAGNOSIS — Z79899 Other long term (current) drug therapy: Secondary | ICD-10-CM | POA: Diagnosis not present

## 2015-10-12 DIAGNOSIS — J449 Chronic obstructive pulmonary disease, unspecified: Secondary | ICD-10-CM | POA: Diagnosis not present

## 2015-10-12 DIAGNOSIS — I252 Old myocardial infarction: Secondary | ICD-10-CM | POA: Diagnosis not present

## 2015-10-12 DIAGNOSIS — E039 Hypothyroidism, unspecified: Secondary | ICD-10-CM | POA: Diagnosis not present

## 2015-10-15 ENCOUNTER — Other Ambulatory Visit (HOSPITAL_COMMUNITY): Payer: Self-pay | Admitting: Psychiatry

## 2015-10-18 ENCOUNTER — Encounter: Payer: Medicare Other | Attending: Family Medicine | Admitting: Nutrition

## 2015-10-18 ENCOUNTER — Encounter: Payer: Self-pay | Admitting: Nutrition

## 2015-10-18 DIAGNOSIS — R739 Hyperglycemia, unspecified: Secondary | ICD-10-CM

## 2015-10-18 DIAGNOSIS — E8881 Metabolic syndrome: Secondary | ICD-10-CM

## 2015-10-18 DIAGNOSIS — E119 Type 2 diabetes mellitus without complications: Secondary | ICD-10-CM | POA: Diagnosis not present

## 2015-10-18 NOTE — Patient Instructions (Addendum)
Goals 1. Follow Plate Method 2. Cut out sugar free beverages and  Drink water only. 3. Eat three meals per day and don't skip meals. 4. Increase fresh fruits and vegetables 5. Use Viinegarette dressings. 6. Walk 15 minutes a day.and increase as tolerated. 7. Lose 1 lb per week. 8. Talk to you therapist and work on emotional eating.

## 2015-10-18 NOTE — Progress Notes (Signed)
  Medical Nutrition Therapy:  Appt start time: 0800 end time:  0900 Assessment:  Primary concerns today: Obesity. LIves with her sister. Here for weight loss. Her sister does the shopping and cooking.Eats 1 meal per day. Had shoulder surgery 10/12/15. Wants to know what she is doing wrong with not losing more weight . She admits to being an emotional eater. She has Biipolar. Bipolar is under control she notes. She recently has lost 11 lbs. She will be attending the diabetes prevention program at [facility] starting this week. She has been heavy all her life. She would like to weigh in the 200's but hasn't been there for 30 years.  Sees a therapist for her emotional eating. Physical actvity: not much,  Has had 2 knee replacements and back surgery. She has thought of bariatric surgery but wants to try to do most of it on her own. She notes she has been told she is prediabetic. A1C 5.6% three months ago but had been 5.8%. Diet is inconsistent to meet her needs for weight loss and prevention of diabetes.   Preferred Learning Style:     Visual  Learning Readiness:    Ready  Change in progress   MEDICATIONS: See list   DIETARY INTAKE:  24-hr recall:  B ( AM): Coffee 10 am cereal or bacon or sausage or toast Snk ( AM):   L ( PM): skips lunch Snk ( PM):  D ( PM): 5-6 pm: Roasted potatoes in oven, green peppers and onions and stew beef,  Water tea flavored Snk ( PM): 13 animal crackers Beverages: Flavored, coffee, 12 oz coke occassonally.  Usual physical activity: ADL.  Estimated energy needs: 1500 calories 170 g carbohydrates 112 g protein  42 g fat  Progress Towards Goal(s):  In progress.   Nutritional Diagnosis:  NB-1.1 Food and nutrition-related knowledge deficit As related to Obesity and Prediabetes.  As evidenced by A1c 5.8% and BMI > BM > 40.    Intervention:  Nutrition and Diabetes education provided on My Plate, CHO counting, meal planning, portion sizes, timing  of meals, avoiding snacks between meals unless having a low blood sugar, target ranges for A1C and blood sugars, signs/symptoms and treatment of hyper/hypoglycemia, monitoring blood sugars, taking medications as prescribed, benefits of exercising 30 minutes per day and prevention of complications of DM. Emotional Eating. Low Fat High Fiber Low Sodium Diet    Goals 1. Follow Plate Method 2. Cut out sugar free beverages and  Drink water only. 3. Eat three meals per day and don't skip meals. 4. Increase fresh fruits and vegetables 5. Use Viinegarette dressings. 6. Walk 15 minutes a day.and increase as tolerated. 7. Lose 1 lb per week. 8. Talk to you therapist and work on emotional eating.  Teaching Method Utilized:  Visual Auditory Hands on  Handouts given during visit include:  The Plate Method  Meal Plan Card  Diabetes Instructions.    Barriers to learning/adherence to lifestyle change: None  Demonstrated degree of understanding via:  Teach Back   Monitoring/Evaluation:  Dietary intake, exercise, meal planning, and body weight in 1 month(s).

## 2015-10-20 ENCOUNTER — Other Ambulatory Visit (HOSPITAL_COMMUNITY): Payer: Self-pay | Admitting: Psychiatry

## 2015-10-20 DIAGNOSIS — F3160 Bipolar disorder, current episode mixed, unspecified: Secondary | ICD-10-CM

## 2015-10-20 MED ORDER — DIVALPROEX SODIUM 500 MG PO DR TAB: 1000.0000 mg | DELAYED_RELEASE_TABLET | Freq: Every day | ORAL | Status: DC

## 2015-10-20 MED ORDER — RISPERIDONE 1 MG PO TABS: 1.0000 mg | ORAL_TABLET | Freq: Every day | ORAL | Status: DC

## 2015-10-20 MED ORDER — DOXEPIN HCL 25 MG PO CAPS: ORAL_CAPSULE | ORAL | Status: DC

## 2015-10-20 MED ORDER — DULOXETINE HCL 60 MG PO CPEP: ORAL_CAPSULE | ORAL | Status: DC

## 2015-11-03 ENCOUNTER — Encounter (HOSPITAL_COMMUNITY): Payer: Self-pay | Admitting: Psychiatry

## 2015-11-03 ENCOUNTER — Ambulatory Visit (INDEPENDENT_AMBULATORY_CARE_PROVIDER_SITE_OTHER): Payer: Medicare Other | Admitting: Psychiatry

## 2015-11-03 VITALS — BP 122/78 | HR 100 | Ht 69.0 in | Wt 367.6 lb

## 2015-11-03 DIAGNOSIS — I272 Other secondary pulmonary hypertension: Secondary | ICD-10-CM

## 2015-11-03 DIAGNOSIS — F3160 Bipolar disorder, current episode mixed, unspecified: Secondary | ICD-10-CM | POA: Diagnosis not present

## 2015-11-03 DIAGNOSIS — F4310 Post-traumatic stress disorder, unspecified: Secondary | ICD-10-CM

## 2015-11-03 MED ORDER — DOXEPIN HCL 25 MG PO CAPS: ORAL_CAPSULE | ORAL | Status: DC

## 2015-11-03 MED ORDER — DULOXETINE HCL 60 MG PO CPEP: ORAL_CAPSULE | ORAL | Status: DC

## 2015-11-03 MED ORDER — RISPERIDONE 1 MG PO TABS: ORAL_TABLET | ORAL | Status: DC

## 2015-11-03 MED ORDER — DIVALPROEX SODIUM 500 MG PO DR TAB: 1000.0000 mg | DELAYED_RELEASE_TABLET | Freq: Every day | ORAL | Status: DC

## 2015-11-03 NOTE — Progress Notes (Signed)
[facility] [phone] Progress Note  Allison Sharp [id number] 60 y.o.  11/03/2015 10:41 AM  Chief Complaint:  I am having a lot of issues with my niece and now I am moved.  I'm living with my older sister who is helping me.  I still have a lot of irritability and poor sleep.  History of Present Illness:  Allison Sharp came for her follow-up appointment with her older sister Allison Sharp.  She recently moved out and now living with her.  She admitted not getting along with her niece and they have been a lot of argument and verbal arguments.  Patient told her niece owe her oh a lot of money and she is not happy about it.  She admitted poor sleep, irritability, continues to have anxiety episodes.  She is taking her medication but there has been no recent Depakote level.  She has no tremors.  Recently her primary care physician tried her on weight loss medication but she did not like it.  She has shoulder surgery a few weeks ago and she is recovering from that.  She was given a lot of pain medication but she is no longer taking these pain medication.  Patient.  Sometime feeling paranoia and having hallucination but denies any active or passive suicidal thoughts or homicidal thought.  Her energy level is low.  She admitted sometime feeling hopeless.  She has racing thoughts.  She is hoping that new place will work her out well.  She does not want to change her medication but wondering if Risperdal can further increase.  Patient denies drinking alcohol or using any illegal substances.  Her vitals are stable.    Suicidal Ideation: No Plan Formed: No Patient has means to carry out plan: No  Homicidal Ideation: No Plan Formed: No Patient has means to carry out plan: No  Past Psychiatric History/Hospitalization(s): Patient has history of psychiatric illness and treatment.  She has at least 4-5 psychiatric hospitalization.  She has history of suicidal attempt in the past by taking overdose on her medication.   She has diagnosed with PTSD, major depressive disorder and bipolar disorder.  In the past she had tried Paxil, Prozac, Wellbutrin, Effexor, Lexapro, amitriptyline, Cymbalta, Neurontin, Depakote, trazodone, Thorazine.  During her last psychiatric hospitalization she was given hydroxyzine, Luvox, Sinequan, Zyprexa .  Recently she was given [place] however to stop due to poor response.  She was seeing this Probation officer in [place] and then Dr. Gilford Rile.  She has history of psychosis, mania, severe mood swing and depression. Anxiety: Yes Bipolar Disorder: Yes Depression: Yes Mania: Yes Psychosis: Yes Schizophrenia: No Personality Disorder: No Hospitalization for psychiatric illness: Yes History of Electroconvulsive Shock Therapy: No Prior Suicide Attempts: Yes  Medical History; Patient has multiple health problem.  She has hyperlipidemia, GERD, chronic back pain, arthritis, sleep apnea, hypertension, COPD, obesity, thyroid problem, coronary artery disease, headaches and she had history of knee arthroscopy, tonsillectomy, back surgery, hysterectomy, cholecystectomy, hernia repair, joint replacement and appendectomy.  Family History; Patient sister, nieces, son has psychiatric issues.  Her sister and niece has been seen in this office.  Her son has drug problem.  Review of Systems: Psychiatric: Agitation: Irritability Hallucination: No Depressed Mood: Yes Insomnia: Yes Hypersomnia: No Altered Concentration: No Feels Worthless: No Grandiose Ideas: No Belief In Special Powers: No New/Increased Substance Abuse: No Compulsions: No  Neurologic: Headache: No Seizure: No Paresthesias: No   Outpatient Encounter Prescriptions as of 11/03/2015  Medication Sig  . albuterol (PROVENTIL HFA;VENTOLIN HFA)  108 (90 BASE) MCG/ACT inhaler Inhale 2 puffs into the lungs every 4 (four) hours as needed for wheezing or shortness of breath.  Marland Kitchen albuterol (PROVENTIL) (2.5 MG/3ML) 0.083% nebulizer solution Take 3 mLs  (2.5 mg total) by nebulization every 4 (four) hours as needed for wheezing or shortness of breath.  Marland Kitchen atorvastatin (LIPITOR) 20 MG tablet Take 1 tablet (20 mg total) by mouth daily.  . cetirizine (ZYRTEC) 10 MG tablet TAKE 1 TABLET ONCE DAILY  . cloNIDine (CATAPRES) 0.1 MG tablet TAKE ONE TABLET AT BEDTIME  . Clotrimazole 1 % OINT Apply 1 application topically 2 (two) times daily as needed (SKIN RASHES).  Marland Kitchen diclofenac sodium (VOLTAREN) 1 % GEL Apply to affected area twice daily as needed  . divalproex (DEPAKOTE) 500 MG DR tablet Take 2 tablets (1,000 mg total) by mouth at bedtime.  Marland Kitchen doxepin (SINEQUAN) 25 MG capsule TAKE (1) CAPSULE DAILY  . DULoxetine (CYMBALTA) 60 MG capsule TAKE (1) CAPSULE DAILY  . fluticasone (FLONASE) 50 MCG/ACT nasal spray Place 1 spray into both nostrils 2 (two) times daily as needed for allergies or rhinitis.  . fluticasone furoate-vilanterol (BREO ELLIPTA) 100-25 MCG/INH AEPB Inhale 1 puff into the lungs daily.  Marland Kitchen levothyroxine (SYNTHROID, LEVOTHROID) 112 MCG tablet Take 1 tablet (112 mcg total) by mouth daily before breakfast. For hypothyroidism.  . Liraglutide -Weight Management (SAXENDA) 18 MG/3ML SOPN Inject 3 mg into the skin daily. (Patient not taking: Reported on 10/18/2015)  . lisinopril-hydrochlorothiazide (PRINZIDE,ZESTORETIC) 20-25 MG tablet Take 1 tablet by mouth daily. (Patient not taking: Reported on 10/18/2015)  . Multiple Vitamin (MULTIVITAMIN WITH MINERALS) TABS Take 1 tablet by mouth daily. For nutritional supplementation.  Marland Kitchen omeprazole (PRILOSEC) 20 MG capsule TAKE (1) CAPSULE DAILY  . risperiDONE (RISPERDAL) 1 MG tablet Take 1 and 1/2 tab at bed time  . [DISCONTINUED] amoxicillin (AMOXIL) 500 MG capsule Take 1 capsule by mouth daily. Reported on 10/05/2015  . [DISCONTINUED] divalproex (DEPAKOTE) 500 MG DR tablet Take 2 tablets (1,000 mg total) by mouth at bedtime.  . [DISCONTINUED] doxepin (SINEQUAN) 25 MG capsule TAKE (1) CAPSULE DAILY  . [DISCONTINUED]  DULoxetine (CYMBALTA) 60 MG capsule TAKE (1) CAPSULE DAILY  . [DISCONTINUED] furosemide (LASIX) 20 MG tablet Take 1 tablet (20 mg total) by mouth daily as needed.  . [DISCONTINUED] meloxicam (MOBIC) 7.5 MG tablet Take 1 tablet (7.5 mg total) by mouth daily.  . [DISCONTINUED] Naltrexone-Bupropion HCl ER 8-90 MG TB12 Take 2 tablets by mouth 2 (two) times daily. 1 tablet twice daily for 1 week, then 2 AM and 1 PM for 1 week, then 2 twice a day  . [DISCONTINUED] oxyCODONE-acetaminophen (PERCOCET) 5-325 MG tablet Take 1-2 tablets by mouth every 4 (four) hours as needed for severe pain. (Patient not taking: Reported on 10/05/2015)  . [DISCONTINUED] risperiDONE (RISPERDAL) 1 MG tablet Take 1 tablet (1 mg total) by mouth at bedtime.   No facility-administered encounter medications on file as of 11/03/2015.    Recent Results (from the past 2160 hour(s))  Basic metabolic panel     Status: Abnormal   Collection Time: 08/11/15  2:05 PM  Result Value Ref Range   Sodium 139 135 - 145 mmol/L   Potassium 4.4 3.5 - 5.1 mmol/L   Chloride 99 (L) 101 - 111 mmol/L   CO2 29 22 - 32 mmol/L   Glucose, Bld 96 65 - 99 mg/dL   BUN 16 6 - 20 mg/dL   Creatinine, Ser 0.93 0.44 - 1.00 mg/dL   Calcium  10.1 8.9 - 10.3 mg/dL   GFR calc non Af Amer >60 >60 mL/min   GFR calc Af Amer >60 >60 mL/min    Comment: (NOTE) The eGFR has been calculated using the CKD EPI equation. This calculation has not been validated in all clinical situations. eGFR's persistently <60 mL/min signify possible Chronic Kidney Disease.    Anion gap 11 5 - 15  CBC     Status: Abnormal   Collection Time: 08/11/15  2:05 PM  Result Value Ref Range   WBC 11.0 (H) 4.0 - 10.5 K/uL   RBC 4.58 3.87 - 5.11 MIL/uL   Hemoglobin 14.1 12.0 - 15.0 g/dL   HCT 42.4 36.0 - 46.0 %   MCV 92.6 78.0 - 100.0 fL   MCH 30.8 26.0 - 34.0 pg   MCHC 33.3 30.0 - 36.0 g/dL   RDW 13.3 11.5 - 15.5 %   Platelets 189 150 - 400 K/uL  CBC w/Diff     Status: Abnormal    Collection Time: 08/31/15  5:09 PM  Result Value Ref Range   WBC 12.1 (H) 4.0 - 10.5 K/uL   RBC 4.46 3.87 - 5.11 Mil/uL   Hemoglobin 13.7 12.0 - 15.0 g/dL   HCT 40.2 36.0 - 46.0 %   MCV 90.2 78.0 - 100.0 fl   MCHC 34.2 30.0 - 36.0 g/dL   RDW 13.2 11.5 - 15.5 %   Platelets 233.0 150.0 - 400.0 K/uL   Neutrophils Relative % 72.0 43.0 - 77.0 %   Lymphocytes Relative 20.9 12.0 - 46.0 %   Monocytes Relative 5.6 3.0 - 12.0 %   Eosinophils Relative 1.4 0.0 - 5.0 %   Basophils Relative 0.1 0.0 - 3.0 %   Neutro Abs 8.7 (H) 1.4 - 7.7 K/uL   Lymphs Abs 2.5 0.7 - 4.0 K/uL   Monocytes Absolute 0.7 0.1 - 1.0 K/uL   Eosinophils Absolute 0.2 0.0 - 0.7 K/uL   Basophils Absolute 0.0 0.0 - 0.1 K/uL  IgE     Status: None   Collection Time: 08/31/15  5:09 PM  Result Value Ref Range   IgE (Immunoglobulin E), Serum 7 <115 kU/L  Pulmonary Function Test     Status: None   Collection Time: 10/05/15 10:07 AM  Result Value Ref Range   FVC-Pre 3.23 L   FVC-%Pred-Pre 82 %   FVC-Post 3.28 L   FVC-%Pred-Post 84 %   FVC-%Change-Post 1 %   FEV1-Pre 2.78 L   FEV1-%Pred-Pre 92 %   FEV1-Post 2.87 L   FEV1-%Pred-Post 95 %   FEV1-%Change-Post 3 %   FEV6-Pre 3.19 L   FEV6-%Pred-Pre 84 %   FEV6-Post 3.28 L   FEV6-%Pred-Post 87 %   FEV6-%Change-Post 2 %   Pre FEV1/FVC ratio 86 %   FEV1FVC-%Pred-Pre 110 %   Post FEV1/FVC ratio 88 %   FEV1FVC-%Change-Post 1 %   Pre FEV6/FVC Ratio 100 %   FEV6FVC-%Pred-Pre 104 %   Post FEV6/FVC ratio 100 %   FEV6FVC-%Pred-Post 104 %   FEF 25-75 Pre 3.78 L/sec   FEF2575-%Pred-Pre 146 %   FEF 25-75 Post 4.20 L/sec   FEF2575-%Pred-Post 162 %   FEF2575-%Change-Post 11 %   RV 1.94 L   RV % pred 85 %   TLC 5.22 L   TLC % pred 89 %   DLCO unc 22.24 ml/min/mmHg   DLCO unc % pred 71 %   DL/VA 4.66 ml/min/mmHg/L   DL/VA % pred 87 %  Constitutional:  BP 122/78 mmHg  Pulse 100  Ht '5\' 9"'  (1.753 m)  Wt 367 lb 9.6 oz (166.742 kg)  BMI 54.26 kg/m2  LMP  02/18/1995   Musculoskeletal: Strength & Muscle Tone: decreased Gait & Station: normal Patient leans: N/A  Psychiatric Specialty Exam: General Appearance: Casual  Eye Contact::  Fair  Speech:  Normal Rate  Volume:  Normal  Mood:  Euthymic  Affect:  Labile  Thought Process:  Coherent  Orientation:  Full (Time, Place, and Person)  Thought Content:  Hallucinations: Noncommand auditory hallucination and Rumination  Suicidal Thoughts:  No  Homicidal Thoughts:  No  Memory:  Immediate;   Fair Recent;   Fair Remote;   Fair  Judgement:  Intact  Insight:  Fair  Psychomotor Activity:  Normal  Concentration:  Fair  Recall:  [employer] of Knowledge:  Fair  Language:  Fair  Akathisia:  No  Handed:  Right  AIMS (if indicated):     Assets:  Communication Skills Desire for Improvement Housing  ADL's:  Intact  Cognition:  Impaired,  Mild  Sleep:        Established Problem, Stable/Improving (1), Review of Psycho-Social Stressors (1), Review or order clinical lab tests (1), Decision to obtain old records (1), Review and summation of old records (2), Established Problem, Worsening (2), Review of Last Therapy Session (1), Review of Medication Regimen & Side Effects (2) and Review of New Medication or Change in Dosage (2)  Assessment: Axis I: Bipolar disorder mixed.  Rule out schizoaffective disorder depressed type, PTSD, rule out major depressive disorder recurrent  Axis II: Deferred  Axis III:  Past Medical History  Diagnosis Date  . Hyperlipidemia   . CAD (coronary artery disease)   . Bipolar 1 disorder ([place])   . GERD (gastroesophageal reflux disease)   . Hyperthyroidism   . Chronic back pain   . Dyspnea     PFT 03/05/09 FEV1 2.77(98%), FVC 3.25(86%), FEV1% 85, TLC 5.88(99%), DLCO 60% ,  Methacholine challenge 03/16/09 normal ,  CT chest 03/12/09 no pulmonary disease  . Anxiety   . Arthritis   . Depression   . OSA on CPAP     2 liters  . HTN (hypertension)   . History of  colonoscopy 10/17/2002    by Dr Rehman-> distal non-specific proctitis, small ext hemorrhoids,   . Fungal infection   . Cellulitis   . Contusion of sacrum   . Migraine headache   . Vitamin D deficiency   . Sleep apnea   . Myocardial infarction ([place])     NOV 1997  . Hypothyroidism     States she only has hyperthyroidism  . Complication of anesthesia     States she typically gets sick s/p anesthesia  . Morbid obesity with body mass index of 50.0-59.9 in adult [facility]) JAN 2011 370 LBS    2004 311 BMI 45.9  . PTSD (post-traumatic stress disorder)   . Asthma   . Allergy   . Urine incontinence   . Chronic headaches   . Suicidal ideation   . COPD (chronic obstructive pulmonary disease) ([place])   . CHF (congestive heart failure) ([facility])     diastolic dysfunction  . PONV (postoperative nausea and vomiting)      Plan:  I reviewed her psychosocial stressors, collateral information from other provider and recent hospital stay and discharge summary.  She is no longer taking weight loss and narcoticpain medication.  I recommended to increase Risperdal 1.5 mg  at bed time continue Depakote ER 1000 mg bedtime Sinequan 25 mg at bedtime and Cymbalta 60 mg daily.  Discussed medication side effects and benefits.  We will also order hemoglobin A1c, Depakote level and CMP.  Recommended to keep appointment with [facility] for counseling.  She has not seen Frankie in a while.  Recommended to call us back if she has any question, concern if she feels worsening of the symptom.  Follow-up in 6 weeks.  ARFEEN,SYED T., MD 11/03/2015

## 2015-11-04 ENCOUNTER — Telehealth: Payer: Self-pay

## 2015-11-04 NOTE — Telephone Encounter (Signed)
Patient needs a visit to be evaluated for this referral as I do not have a documented reason currently in our charts.

## 2015-11-04 NOTE — Telephone Encounter (Signed)
Need a referral to Dr Veatrice Kells

## 2015-11-05 NOTE — Telephone Encounter (Signed)
Patient aware that she will need to be seen and appointment scheduled for 6/12 with Dr. Warrick Parisian.

## 2015-11-08 ENCOUNTER — Ambulatory Visit (INDEPENDENT_AMBULATORY_CARE_PROVIDER_SITE_OTHER): Payer: Medicare Other | Admitting: Family Medicine

## 2015-11-08 ENCOUNTER — Encounter: Payer: Self-pay | Admitting: Family Medicine

## 2015-11-08 VITALS — BP 121/87 | HR 90 | Temp 98.2°F | Ht 69.0 in | Wt 367.6 lb

## 2015-11-08 DIAGNOSIS — M7631 Iliotibial band syndrome, right leg: Secondary | ICD-10-CM

## 2015-11-08 DIAGNOSIS — M545 Low back pain, unspecified: Secondary | ICD-10-CM

## 2015-11-08 DIAGNOSIS — I272 Other secondary pulmonary hypertension: Secondary | ICD-10-CM | POA: Diagnosis not present

## 2015-11-08 LAB — VALPROIC ACID LEVEL: VALPROIC ACID LVL: 85.3 ug/mL (ref 50.0–100.0)

## 2015-11-08 NOTE — Progress Notes (Signed)
BP 121/87 mmHg  Pulse 90  Temp(Src) 98.2 F (36.8 C) (Oral)  Ht 5\' 9"  (1.753 m)  Wt 367 lb 9.6 oz (166.742 kg)  BMI 54.26 kg/m2  LMP 02/18/1995   Subjective:    Patient ID: Allison Sharp, female    DOB: [birth year], 60 y.o.   MRN: [id number]  HPI: Allison Sharp is a 60 y.o. female presenting on 11/08/2015 for Back Pain   HPI Low back pain and right leg pain Patient has had worsening low back pain over the past few months. This is similar to the low back pain that she had when she had a previous surgery in 2008. 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 Over the past few weeks she has been having low back pain. Signs but not midline and then she is also had burning pain sensation that sharp and stabbing on her right bilateral thigh. She denies any fevers or chills or overlying skin changes. She is very morbidly obese and knows that plays a factor in this. She has research and would like to go see Dr. Joya Salm from [facility]. She denies any numbness and weakness in either leg. The back pain is starting to limit her from being able to do her day-to-day tasks because it hurts so much.  Relevant past medical, surgical, family and social history reviewed and updated as indicated. Interim medical history since our last visit reviewed. Allergies and medications reviewed and updated.  Review of Systems  Constitutional: Negative for fever and chills.  HENT: Negative for congestion, ear discharge and ear pain.   Eyes: Negative for redness and visual disturbance.  Respiratory: Negative for chest tightness and shortness of breath.   Cardiovascular: Negative for chest pain and leg swelling.  Genitourinary: Negative for dysuria and difficulty urinating.  Musculoskeletal: Positive for myalgias, back pain and arthralgias. Negative for gait problem.  Skin: Negative for rash.  Neurological:  Negative for dizziness, weakness, light-headedness, numbness and headaches.  Psychiatric/Behavioral: Negative for behavioral problems and agitation.  All other systems reviewed and are negative.   Per HPI unless specifically indicated above     Medication List       This list is accurate as of: 11/08/15  9:10 AM.  Always use your most recent med list.               albuterol 108 (90 Base) MCG/ACT inhaler  Commonly known as:  PROVENTIL HFA;VENTOLIN HFA  Inhale 2 puffs into the lungs every 4 (four) hours as needed for wheezing or shortness of breath.     albuterol (2.5 MG/3ML) 0.083% nebulizer solution  Commonly known as:  PROVENTIL  Take 3 mLs (2.5 mg total) by nebulization every 4 (four) hours as needed for wheezing or shortness of breath.     atorvastatin 20 MG tablet  Commonly known as:  LIPITOR  Take 1 tablet (20 mg total) by mouth daily.     cetirizine 10 MG tablet  Commonly known as:  ZYRTEC  TAKE 1 TABLET ONCE DAILY     cloNIDine 0.1 MG tablet  Commonly known as:  CATAPRES  TAKE ONE TABLET AT BEDTIME     Clotrimazole 1 % Oint  Apply 1 application topically 2 (two) times daily as needed (SKIN RASHES).     diclofenac sodium 1 % Gel  Commonly known as:  VOLTAREN  Apply to affected area twice daily as needed     divalproex 500 MG DR tablet  Commonly known as:  DEPAKOTE  Take 2 tablets (1,000 mg total) by mouth at bedtime.     doxepin 25 MG capsule  Commonly known as:  SINEQUAN  TAKE (1) CAPSULE DAILY     DULoxetine 60 MG capsule  Commonly known as:  CYMBALTA  TAKE (1) CAPSULE DAILY     fluticasone 50 MCG/ACT nasal spray  Commonly known as:  FLONASE  Place 1 spray into both nostrils 2 (two) times daily as needed for allergies or rhinitis.     fluticasone furoate-vilanterol 100-25 MCG/INH Aepb  Commonly known as:  BREO ELLIPTA  Inhale 1 puff into the lungs daily.     levothyroxine 112 MCG tablet  Commonly known as:  SYNTHROID, LEVOTHROID  Take 1  tablet (112 mcg total) by mouth daily before breakfast. For hypothyroidism.     Liraglutide -Weight Management 18 MG/3ML Sopn  Commonly known as:  SAXENDA  Inject 3 mg into the skin daily.     lisinopril-hydrochlorothiazide 20-25 MG tablet  Commonly known as:  PRINZIDE,ZESTORETIC  Take 1 tablet by mouth daily.     multivitamin with minerals Tabs tablet  Take 1 tablet by mouth daily. For nutritional supplementation.     omeprazole 20 MG capsule  Commonly known as:  PRILOSEC  TAKE (1) CAPSULE DAILY     risperiDONE 1 MG tablet  Commonly known as:  RISPERDAL  Take 1 and 1/2 tab at bed time           Objective:    BP 121/87 mmHg  Pulse 90  Temp(Src) 98.2 F (36.8 C) (Oral)  Ht 5\' 9"  (1.753 m)  Wt 367 lb 9.6 oz (166.742 kg)  BMI 54.26 kg/m2  LMP 02/18/1995  Wt Readings from Last 3 Encounters:  11/08/15 367 lb 9.6 oz (166.742 kg)  11/03/15 367 lb 9.6 oz (166.742 kg)  10/18/15 367 lb (166.47 kg)    Physical Exam  Constitutional: She is oriented to person, place, and time. She appears well-developed and well-nourished. No distress.  Eyes: Conjunctivae and EOM are normal. Pupils are equal, round, and reactive to light.  Cardiovascular: Normal rate, regular rhythm, normal heart sounds and intact distal pulses.   No murmur heard. Pulmonary/Chest: Effort normal and breath sounds normal. No respiratory distress. She has no wheezes.  Musculoskeletal: Normal range of motion. She exhibits tenderness. She exhibits no edema.       Right hip: She exhibits tenderness. She exhibits normal range of motion, normal strength and no bony tenderness.       Lumbar back: She exhibits tenderness (Bilateral paraspinal tenderness in the lumbar region about L2-L3, negative straight leg raise bilaterally). She exhibits normal range of motion, no bony tenderness, no swelling, no edema, no deformity and normal pulse.       Legs: Neurological: She is alert and oriented to person, place, and time.  Coordination normal.  Skin: Skin is warm and dry. No rash noted. She is not diaphoretic.  Psychiatric: She has a normal mood and affect. Her behavior is normal.  Nursing note and vitals reviewed.     Assessment & Plan:   Problem List Items Addressed This Visit    None    Visit Diagnoses    Bilateral low back pain without sciatica    -  Primary    Had surgeries before and would like to go back to a spine surgeon that she's  heard is good    Relevant Orders    Ambulatory referral to Orthopedic Surgery    IT band syndrome, right        Recommend continue we'll tearing, gave stretches and use ice water bottle to massage it out        Follow up plan: Return if symptoms worsen or fail to improve.  Counseling provided for all of the vaccine components Orders Placed This Encounter  Procedures  . Ambulatory referral to Hatch Dettinger, MD [place] [facility] 11/08/2015, 9:10 AM

## 2015-11-09 LAB — COMPLETE METABOLIC PANEL WITH GFR
ALBUMIN: 4 g/dL (ref 3.6–5.1)
ALK PHOS: 77 U/L (ref 33–130)
ALT: 8 U/L (ref 6–29)
AST: 14 U/L (ref 10–35)
BUN: 14 mg/dL (ref 7–25)
CALCIUM: 9.1 mg/dL (ref 8.6–10.4)
CO2: 27 mmol/L (ref 20–31)
Chloride: 102 mmol/L (ref 98–110)
Creat: 0.81 mg/dL (ref 0.50–0.99)
GFR, EST NON AFRICAN AMERICAN: 79 mL/min (ref >=60)
Glucose, Bld: 80 mg/dL (ref 65–99)
POTASSIUM: 3.8 mmol/L (ref 3.5–5.3)
SODIUM: 142 mmol/L (ref 135–146)
Total Bilirubin: 0.4 mg/dL (ref 0.2–1.2)
Total Protein: 6.3 g/dL (ref 6.1–8.1)

## 2015-11-09 LAB — HEMOGLOBIN A1C
Hgb A1c MFr Bld: 5.8 % — ABNORMAL HIGH (ref <5.7)
Mean Plasma Glucose: 120 mg/dL

## 2015-11-10 ENCOUNTER — Ambulatory Visit: Payer: Self-pay | Admitting: Cardiology

## 2015-11-10 DIAGNOSIS — R0902 Hypoxemia: Secondary | ICD-10-CM | POA: Diagnosis not present

## 2015-11-10 DIAGNOSIS — G4733 Obstructive sleep apnea (adult) (pediatric): Secondary | ICD-10-CM | POA: Diagnosis not present

## 2015-11-18 ENCOUNTER — Ambulatory Visit: Payer: Self-pay | Admitting: Nutrition

## 2015-11-25 ENCOUNTER — Telehealth: Payer: Self-pay | Admitting: Family Medicine

## 2015-11-25 ENCOUNTER — Ambulatory Visit: Payer: Medicare Other

## 2015-11-25 NOTE — Telephone Encounter (Signed)
Pt given appt in the afterhours clinic at 6:15.

## 2015-11-29 ENCOUNTER — Ambulatory Visit: Payer: Self-pay | Admitting: Physician Assistant

## 2015-12-01 DIAGNOSIS — Z981 Arthrodesis status: Secondary | ICD-10-CM | POA: Diagnosis not present

## 2015-12-01 DIAGNOSIS — M545 Low back pain: Secondary | ICD-10-CM | POA: Diagnosis not present

## 2015-12-01 DIAGNOSIS — M5430 Sciatica, unspecified side: Secondary | ICD-10-CM | POA: Diagnosis not present

## 2015-12-01 DIAGNOSIS — M5431 Sciatica, right side: Secondary | ICD-10-CM | POA: Diagnosis not present

## 2015-12-03 ENCOUNTER — Ambulatory Visit (INDEPENDENT_AMBULATORY_CARE_PROVIDER_SITE_OTHER): Payer: Medicare Other | Admitting: Cardiology

## 2015-12-03 ENCOUNTER — Encounter: Payer: Self-pay | Admitting: Cardiology

## 2015-12-03 VITALS — BP 129/81 | HR 108 | Ht 69.0 in | Wt 373.0 lb

## 2015-12-03 DIAGNOSIS — I10 Essential (primary) hypertension: Secondary | ICD-10-CM | POA: Diagnosis not present

## 2015-12-03 DIAGNOSIS — Z9989 Dependence on other enabling machines and devices: Principal | ICD-10-CM

## 2015-12-03 DIAGNOSIS — G4733 Obstructive sleep apnea (adult) (pediatric): Secondary | ICD-10-CM | POA: Diagnosis not present

## 2015-12-03 DIAGNOSIS — R0602 Shortness of breath: Secondary | ICD-10-CM | POA: Diagnosis not present

## 2015-12-03 LAB — CBC WITH DIFFERENTIAL/PLATELET
BASOS ABS: 75 {cells}/uL (ref 0–200)
BASOS PCT: 1 %
EOS ABS: 150 {cells}/uL (ref 15–500)
Eosinophils Relative: 2 %
HCT: 39.5 % (ref 35.0–45.0)
HEMOGLOBIN: 13.6 g/dL (ref 11.7–15.5)
LYMPHS ABS: 2250 {cells}/uL (ref 850–3900)
Lymphocytes Relative: 30 %
MCH: 30.1 pg (ref 27.0–33.0)
MCHC: 34.4 g/dL (ref 32.0–36.0)
MCV: 87.4 fL (ref 80.0–100.0)
MONO ABS: 675 {cells}/uL (ref 200–950)
MPV: 9.7 fL (ref 7.5–12.5)
Monocytes Relative: 9 %
NEUTROS ABS: 4350 {cells}/uL (ref 1500–7800)
Neutrophils Relative %: 58 %
Platelets: 177 10*3/uL (ref 140–400)
RBC: 4.52 MIL/uL (ref 3.80–5.10)
RDW: 14.5 % (ref 11.0–15.0)
WBC: 7.5 10*3/uL (ref 3.8–10.8)

## 2015-12-03 LAB — BASIC METABOLIC PANEL
BUN: 12 mg/dL (ref 7–25)
CHLORIDE: 102 mmol/L (ref 98–110)
CO2: 27 mmol/L (ref 20–31)
Calcium: 9.3 mg/dL (ref 8.6–10.4)
Creat: 0.83 mg/dL (ref 0.50–0.99)
Glucose, Bld: 125 mg/dL — ABNORMAL HIGH (ref 65–99)
POTASSIUM: 3.8 mmol/L (ref 3.5–5.3)
SODIUM: 139 mmol/L (ref 135–146)

## 2015-12-03 LAB — TSH: TSH: 2.95 mIU/L

## 2015-12-03 LAB — BRAIN NATRIURETIC PEPTIDE: Brain Natriuretic Peptide: 5.3 pg/mL (ref <100)

## 2015-12-03 NOTE — Progress Notes (Signed)
Cardiology Office Note    Date:  12/03/2015   ID:  Allison Sharp, DOB October 30, 1955, MRN [id number]  PCP:  Allison Rancher, MD  Cardiologist:  Allison Him, MD   Chief Complaint  Patient presents with  . Sleep Apnea  . Hypertension    History of Present Illness:  Allison Sharp is a 60 y.o. female who presents today for evaluation of OSA. She has a history of OSA and told Allison Sharp that she felt she was not getting the benefit she used to from it.  She underwent PSG showing mild OSA with an AHI of 7.7/hr and oxygen desaturations as low as 83%.  She underwent CPAP titration to 11cm H2O.  She is doing well with her CPAP device.  She tolerates her full face mask.  She is concerned that the pressure may not be adequate because she is still waking up at night SOB.  She has chronic DOE from asthma.  She says that her DOE has gotten worse and has limited her daily activities.  Since going on CPAP she has not had any improvement in her daytime sleepiness.  She goes to bed around 10pm and gets up to go to the bathroom 6 times nightly and then gets up around 10am.     Past Medical History  Diagnosis Date  . Hyperlipidemia   . CAD (coronary artery disease)   . Bipolar 1 disorder ([place])   . GERD (gastroesophageal reflux disease)   . Hyperthyroidism   . Chronic back pain   . Dyspnea     PFT 03/05/09 FEV1 2.77(98%), FVC 3.25(86%), FEV1% 85, TLC 5.88(99%), DLCO 60% ,  Methacholine challenge 03/16/09 normal ,  CT chest 03/12/09 no pulmonary disease  . Anxiety   . Arthritis   . Depression   . OSA on CPAP     she had been on 2L O2 at night but that was stopped  . HTN (hypertension)   . History of colonoscopy 10/17/2002    by Dr Rehman-> distal non-specific proctitis, small ext hemorrhoids,   . Fungal infection   . Cellulitis   . Contusion of sacrum   . Migraine headache   . Vitamin D deficiency   . Myocardial infarction ([place])     NOV 1997  . Hypothyroidism     States she only has  hyperthyroidism  . Complication of anesthesia     States she typically gets sick s/p anesthesia  . Morbid obesity with body mass index of 50.0-59.9 in adult [facility]) JAN 2011 370 LBS    2004 311 BMI 45.9  . PTSD (post-traumatic stress disorder)   . Asthma   . Allergy   . Urine incontinence   . Chronic headaches   . Suicidal ideation   . COPD (chronic obstructive pulmonary disease) ([place])   . CHF (congestive heart failure) ([facility])     diastolic dysfunction  . PONV (postoperative nausea and vomiting)     Past Surgical History  Procedure Laterality Date  . Cholecystectomy    . Knee arthroscopy    . Appendectomy    . Tonsillectomy    . Back surgery  2008  . Total vaginal hysterectomy    . Abdominal hysterectomy      sept 1996  . Tubal ligation    . Colonoscopy  10/17/2002     Distal proctitis, small external hemorrhoids, otherwise/  normal colonoscopy. Suspect rectal bleeding secondary to hemorrhoids  . Esophagogastroduodenoscopy  03/18/09    fundic gland  polyps/mild gastritis  . Cardiac catheterization      nov 1997  . Joint replacement      bil knee replacement  . Hernia repair  1978  . Multiple extractions with alveoloplasty N/A 08/16/2015    Procedure: EXTRACTION OF TEETH THREE, SIX, EIGHT, NINE, ELEVEN, FOURTEEN, FIFTEEN, TWENTY-EIGHT WITH ALVEOLOPLASTY;  Surgeon: Allison Sharp, DDS;  Location: [facility];  Service: Oral Surgery;  Laterality: N/A;    Current Medications: Outpatient Prescriptions Prior to Visit  Medication Sig Dispense Refill  . albuterol (PROVENTIL HFA;VENTOLIN HFA) 108 (90 BASE) MCG/ACT inhaler Inhale 2 puffs into the lungs every 4 (four) hours as needed for wheezing or shortness of breath. 1 Inhaler 0  . albuterol (PROVENTIL) (2.5 MG/3ML) 0.083% nebulizer solution Take 3 mLs (2.5 mg total) by nebulization every 4 (four) hours as needed for wheezing or shortness of breath. 75 mL 0  . atorvastatin (LIPITOR) 20 MG tablet Take 1 tablet (20 mg total) by mouth daily. 90  tablet 3  . cetirizine (ZYRTEC) 10 MG tablet TAKE 1 TABLET ONCE DAILY 30 tablet 3  . cloNIDine (CATAPRES) 0.1 MG tablet TAKE ONE TABLET AT BEDTIME 30 tablet 5  . Clotrimazole 1 % OINT Apply 1 application topically 2 (two) times daily as needed (SKIN RASHES).    Marland Kitchen diclofenac sodium (VOLTAREN) 1 % GEL Apply to affected area twice daily as needed 100 g 0  . divalproex (DEPAKOTE) 500 MG DR tablet Take 2 tablets (1,000 mg total) by mouth at bedtime. 60 tablet 1  . doxepin (SINEQUAN) 25 MG capsule TAKE (1) CAPSULE DAILY 30 capsule 1  . DULoxetine (CYMBALTA) 60 MG capsule TAKE (1) CAPSULE DAILY 30 capsule 1  . fluticasone (FLONASE) 50 MCG/ACT nasal spray Place 1 spray into both nostrils 2 (two) times daily as needed for allergies or rhinitis. 16 g 6  . fluticasone furoate-vilanterol (BREO ELLIPTA) 100-25 MCG/INH AEPB Inhale 1 puff into the lungs daily. 1 each 2  . levothyroxine (SYNTHROID, LEVOTHROID) 112 MCG tablet Take 1 tablet (112 mcg total) by mouth daily before breakfast. For hypothyroidism. 90 tablet 2  . lisinopril-hydrochlorothiazide (PRINZIDE,ZESTORETIC) 20-25 MG tablet Take 1 tablet by mouth daily. 30 tablet 5  . Multiple Vitamin (MULTIVITAMIN WITH MINERALS) TABS Take 1 tablet by mouth daily. For nutritional supplementation. 30 tablet 0  . omeprazole (PRILOSEC) 20 MG capsule TAKE (1) CAPSULE DAILY 30 capsule 5  . risperiDONE (RISPERDAL) 1 MG tablet Take 1 and 1/2 tab at bed time 45 tablet 1  . Liraglutide -Weight Management (SAXENDA) 18 MG/3ML SOPN Inject 3 mg into the skin daily. (Patient not taking: Reported on 12/03/2015) 15 mL 1   No facility-administered medications prior to visit.     Allergies:   Ativan; Haldol; and Naproxen   Social History   Social History  . Marital Status: Single    Spouse Name: N/A  . Number of Children: 2  . Years of Education: N/A   Occupational History  . Disabled     back problems   Social History Main Topics  . Smoking status: Never Smoker   .  Smokeless tobacco: Never Used  . Alcohol Use: No  . Drug Use: No  . Sexual Activity: No   Other Topics Concern  . None   Social History Narrative   [name] is disabled and lives with her sister, [place]. She has another sister with whom she has lived with in the past, and who is a Designer, jewellery, but not currently practicing.  She has two grown sons that she does not see often, as they live in other states. She has 5 grand children.   [name] has a long history of mental illness including depression, PTSD, suicidal and homicidal ideation.   She has been obese most all of her life. Her weight has significantly impacted her QOL. She recently lost 20 lbs. By decreasing portion size & increasing proteins.      Family History:  The patient's family history includes AAA (abdominal aortic aneurysm) in her mother; Alcohol abuse in her father; Bipolar disorder in her brother, mother, and sister; Cancer in her maternal aunt, sister, and son; Coronary artery disease in her brother and father; Dementia in her mother; Depression in her brother, mother, sister, and sister; Heart attack in her mother; Hypertension in her brother, mother, and sister; Paranoid behavior in her sister. There is no history of Anesthesia problems, Hypotension, Malignant hyperthermia, or Pseudochol deficiency.   ROS:   Please see the history of present illness.    ROS All other systems reviewed and are negative.   PHYSICAL EXAM:   VS:  BP 129/81 mmHg  Pulse 108  Ht 5\' 9"  (1.753 m)  Wt 373 lb (169.192 kg)  BMI 55.06 kg/m2  LMP 02/18/1995   GEN: Well nourished, well developed, in no acute distress HEENT: normal Neck: no JVD, carotid bruits, or masses Cardiac: RRR; no murmurs, rubs, or gallops,no edema.  Intact distal pulses bilaterally.  Respiratory:  clear to auscultation bilaterally, normal work of breathing GI: soft, nontender, nondistended, + BS MS: no deformity or atrophy Skin: warm and dry, no rash Neuro:   Alert and Oriented x 3, Strength and sensation are intact Psych: euthymic mood, full affect  Wt Readings from Last 3 Encounters:  12/03/15 373 lb (169.192 kg)  11/08/15 367 lb 9.6 oz (166.742 kg)  11/03/15 367 lb 9.6 oz (166.742 kg)      Studies/Labs Reviewed:   EKG:  EKG is ordered today and showed NSR at 96bpm with no ST changes  Recent Labs: 01/19/2015: TSH 2.994 05/23/2015: B Natriuretic Peptide 20.0 08/31/2015: Hemoglobin 13.7; Platelets 233.0 11/08/2015: ALT 8; BUN 14; Creat 0.81; Potassium 3.8; Sodium 142   Lipid Panel    Component Value Date/Time   CHOL 236* 07/02/2015 1003   CHOL 168 12/10/2013 1201   TRIG 181* 07/02/2015 1003   HDL 39* 07/02/2015 1003   HDL 48.60 12/10/2013 1201   CHOLHDL 6.1* 07/02/2015 1003   CHOLHDL 3 12/10/2013 1201   VLDL 30.0 12/10/2013 1201   LDLCALC 161* 07/02/2015 1003   LDLCALC 89 12/10/2013 1201    Additional studies/ records that were reviewed today include:  CPAP download    ASSESSMENT:    1. OSA on CPAP   2. Essential hypertension, benign   3. Morbid obesity, unspecified obesity type ([place])      PLAN:  In order of problems listed above:  OSA - the patient is tolerating PAP therapy well without any problems. The PAP download was reviewed today and showed an AHI of 1.6/hr on 11 cm H2O with 97% compliance in using more than 4 hours nightly.  The patient has been using and benefiting from CPAP use and will continue to benefit from therapy.  HTN - Bp controlled on current medical regimen. Obesity - I have encouraged Sharp to get into a routine exercise program and cut back on carbs and portions.  DOE which seems to have worsened.  She say pulmonary and has some mild  asthma but they felt that her DOE was more heart related. She has not had an ischemic workup in some time.  I will order a 2 day Lexsican myoview to rule out ischemia and check a 2D echo to assess LVF and valvular disease.  Since she is waking up at night SOB I will get an  overnight pulse ox on CPAP to make sure she is not having nocturnal hypoxemia.  She had been on O2 at night in the past. Check BNP to rule out volume overload.  Check TSH to rule out thyroid etiology and CBC to make sure she is not anemic.      Medication Adjustments/Labs and Tests Ordered: Current medicines are reviewed at length with the patient today.  Concerns regarding medicines are outlined above.  Medication changes, Labs and Tests ordered today are listed in the Patient Instructions below.  There are no Patient Instructions on file for this visit.   Signed, Allison Him, MD  12/03/2015 9:37 AM    [address], [place], [place]  [address] Phone: [phone]; Fax: [phone]

## 2015-12-03 NOTE — Patient Instructions (Signed)
Medication Instructions:  Your physician recommends that you continue on your current medications as directed. Please refer to the Current Medication list given to you today.   Labwork: TODAY: BMET, CBC, TSH, BNP  Testing/Procedures: Your physician has requested that you have an echocardiogram. Echocardiography is a painless test that uses sound waves to create images of your heart. It provides your doctor with information about the size and shape of your heart and how well your heart's chambers and valves are working. This procedure takes approximately one hour. There are no restrictions for this procedure.   Your physician has requested that you have a lexiscan myoview. For further information please visit [email or web address]. Please follow instruction sheet, as given.  Follow-Up: Your physician wants you to follow-up in: 1 year with Dr. Radford Pax. You will receive a reminder letter in the mail two months in advance. If you don't receive a letter, please call our office to schedule the follow-up appointment.   Any Other Special Instructions Will Be Listed Below (If Applicable).     If you need a refill on your cardiac medications before your next appointment, please call your pharmacy.

## 2015-12-06 ENCOUNTER — Telehealth: Payer: Self-pay | Admitting: Cardiology

## 2015-12-06 NOTE — Telephone Encounter (Signed)
Reviewed recent lab results with pt who stated understanding.  She had no further questions.

## 2015-12-06 NOTE — Telephone Encounter (Signed)
F/U  Pt returning phone call - lab work stated was fine to call tomorrow 7/11. Please call back.

## 2015-12-08 ENCOUNTER — Telehealth: Payer: Self-pay | Admitting: Family Medicine

## 2015-12-08 NOTE — Telephone Encounter (Signed)
Called patient.  She is anticipating back surgery and doesn't think she can continue with pre diabetes class due to the exercise portion.  Let me know that she can still do just the diet changes.  She felt like there was just too much going on right now.

## 2015-12-10 DIAGNOSIS — R0902 Hypoxemia: Secondary | ICD-10-CM | POA: Diagnosis not present

## 2015-12-10 DIAGNOSIS — G4733 Obstructive sleep apnea (adult) (pediatric): Secondary | ICD-10-CM | POA: Diagnosis not present

## 2015-12-14 ENCOUNTER — Encounter: Payer: Self-pay | Admitting: Cardiology

## 2015-12-20 ENCOUNTER — Ambulatory Visit (HOSPITAL_COMMUNITY): Payer: Self-pay | Admitting: Psychiatry

## 2015-12-22 ENCOUNTER — Ambulatory Visit (HOSPITAL_COMMUNITY): Payer: Self-pay

## 2015-12-22 ENCOUNTER — Other Ambulatory Visit (HOSPITAL_COMMUNITY): Payer: Self-pay

## 2015-12-23 ENCOUNTER — Ambulatory Visit (HOSPITAL_COMMUNITY): Payer: Self-pay

## 2015-12-23 ENCOUNTER — Other Ambulatory Visit: Payer: Self-pay | Admitting: Family Medicine

## 2015-12-29 DIAGNOSIS — Z981 Arthrodesis status: Secondary | ICD-10-CM | POA: Diagnosis not present

## 2015-12-29 DIAGNOSIS — M5430 Sciatica, unspecified side: Secondary | ICD-10-CM | POA: Diagnosis not present

## 2015-12-29 DIAGNOSIS — M4806 Spinal stenosis, lumbar region: Secondary | ICD-10-CM | POA: Diagnosis not present

## 2015-12-31 DIAGNOSIS — M5431 Sciatica, right side: Secondary | ICD-10-CM | POA: Diagnosis not present

## 2015-12-31 DIAGNOSIS — M545 Low back pain: Secondary | ICD-10-CM | POA: Diagnosis not present

## 2015-12-31 DIAGNOSIS — M961 Postlaminectomy syndrome, not elsewhere classified: Secondary | ICD-10-CM | POA: Diagnosis not present

## 2015-12-31 DIAGNOSIS — Z981 Arthrodesis status: Secondary | ICD-10-CM | POA: Diagnosis not present

## 2016-01-05 ENCOUNTER — Telehealth (HOSPITAL_COMMUNITY): Payer: Self-pay | Admitting: *Deleted

## 2016-01-05 NOTE — Telephone Encounter (Signed)
Patient given detailed instructions per Myocardial Perfusion Study Information Sheet for the test on 01/10/16. Patient notified to arrive 15 minutes early and that it is imperative to arrive on time for appointment to keep from having the test rescheduled.  If you need to cancel or reschedule your appointment, please call the office within 24 hours of your appointment. Failure to do so may result in a cancellation of your appointment, and a $50 no show fee. Patient verbalized understanding. Hubbard Robinson, RN

## 2016-01-06 ENCOUNTER — Other Ambulatory Visit (HOSPITAL_COMMUNITY): Payer: Self-pay | Admitting: Psychiatry

## 2016-01-06 DIAGNOSIS — F3160 Bipolar disorder, current episode mixed, unspecified: Secondary | ICD-10-CM

## 2016-01-10 ENCOUNTER — Ambulatory Visit (HOSPITAL_COMMUNITY): Payer: Medicare Other

## 2016-01-10 ENCOUNTER — Other Ambulatory Visit (HOSPITAL_COMMUNITY): Payer: Self-pay

## 2016-01-10 DIAGNOSIS — G4733 Obstructive sleep apnea (adult) (pediatric): Secondary | ICD-10-CM | POA: Diagnosis not present

## 2016-01-10 DIAGNOSIS — R0902 Hypoxemia: Secondary | ICD-10-CM | POA: Diagnosis not present

## 2016-01-11 ENCOUNTER — Ambulatory Visit (HOSPITAL_COMMUNITY): Payer: Self-pay

## 2016-01-13 ENCOUNTER — Other Ambulatory Visit: Payer: Self-pay | Admitting: Family Medicine

## 2016-01-13 ENCOUNTER — Other Ambulatory Visit (HOSPITAL_COMMUNITY): Payer: Self-pay | Admitting: Psychiatry

## 2016-01-13 DIAGNOSIS — F3160 Bipolar disorder, current episode mixed, unspecified: Secondary | ICD-10-CM

## 2016-01-14 ENCOUNTER — Other Ambulatory Visit (HOSPITAL_COMMUNITY): Payer: Self-pay | Admitting: Psychiatry

## 2016-01-14 DIAGNOSIS — F3160 Bipolar disorder, current episode mixed, unspecified: Secondary | ICD-10-CM

## 2016-01-18 ENCOUNTER — Telehealth: Payer: Self-pay | Admitting: Family Medicine

## 2016-01-18 ENCOUNTER — Ambulatory Visit (INDEPENDENT_AMBULATORY_CARE_PROVIDER_SITE_OTHER): Payer: Medicare Other | Admitting: Family Medicine

## 2016-01-18 ENCOUNTER — Encounter: Payer: Self-pay | Admitting: Family Medicine

## 2016-01-18 VITALS — BP 135/85 | HR 75 | Temp 98.9°F | Ht 69.0 in | Wt 375.8 lb

## 2016-01-18 DIAGNOSIS — R300 Dysuria: Secondary | ICD-10-CM | POA: Diagnosis not present

## 2016-01-18 DIAGNOSIS — M549 Dorsalgia, unspecified: Secondary | ICD-10-CM

## 2016-01-18 DIAGNOSIS — G8929 Other chronic pain: Secondary | ICD-10-CM

## 2016-01-18 LAB — MICROSCOPIC EXAMINATION: RBC MICROSCOPIC, UA: NONE SEEN /HPF (ref 0–?)

## 2016-01-18 LAB — URINALYSIS, COMPLETE
Bilirubin, UA: NEGATIVE
GLUCOSE, UA: NEGATIVE
KETONES UA: NEGATIVE
Leukocytes, UA: NEGATIVE
NITRITE UA: NEGATIVE
PROTEIN UA: NEGATIVE
RBC, UA: NEGATIVE
SPEC GRAV UA: 1.02 (ref 1.005–1.030)
UUROB: 4 mg/dL — AB (ref 0.2–1.0)
pH, UA: 7 (ref 5.0–7.5)

## 2016-01-18 MED ORDER — MELOXICAM 7.5 MG PO TABS: 7.5000 mg | ORAL_TABLET | Freq: Every day | ORAL | 2 refills | Status: DC

## 2016-01-18 MED ORDER — MELOXICAM 15 MG PO TABS: 15.0000 mg | ORAL_TABLET | Freq: Every day | ORAL | 2 refills | Status: DC

## 2016-01-18 NOTE — Progress Notes (Signed)
BP 135/85 (BP Location: Left Arm, Patient Position: Sitting, Cuff Size: Large)   Pulse 75   Temp 98.9 F (37.2 C) (Oral)   Ht 5\' 9"  (1.753 m)   Wt (!) 375 lb 12.8 oz (170.5 kg)   LMP 02/18/1995   BMI 55.50 kg/m    Subjective:    Patient ID: Allison Sharp, female    DOB: 05-06-1956, 60 y.o.   MRN: [id number]  HPI: Allison Sharp is a 60 y.o. female presenting on 01/18/2016 for Back Pain (has had MRI which shows inflammation, waiting to get into pain management) and Urinary Tract Infection (dysuria)   HPI Chronic back pain Patient comes in today for chronic back pain as she was seen in orthopedic and getting worked up for that. She had an MRI of the L-spine at [facility] which showed mild to moderate right foraminal stenosis at L3-L4 and that she was status post PLIF of [id number] without complications she has been fighting this back pain for some time and that's why we were consulted orthopedics to help deal with it. She says is mostly on the right flank and she is also leaving a urinary sample because of some dysuria associated with it. She denies any fevers or chills. She denies any fevers or chills or flank pain, the back pain is just right lower. She denies any vaginal burning or irritation or rashes.  Relevant past medical, surgical, family and social history reviewed and updated as indicated. Interim medical history since our last visit reviewed. Allergies and medications reviewed and updated.  Review of Systems  Constitutional: Negative for chills and fever.  HENT: Negative for congestion, ear discharge and ear pain.   Eyes: Negative for redness and visual disturbance.  Respiratory: Negative for chest tightness and shortness of breath.   Cardiovascular: Negative for chest pain and leg swelling.  Gastrointestinal: Negative for abdominal pain, constipation, diarrhea, nausea and vomiting.  Genitourinary: Positive for dysuria. Negative for decreased urine volume, difficulty  urinating, flank pain, frequency, hematuria, pelvic pain, vaginal bleeding, vaginal discharge and vaginal pain.  Musculoskeletal: Positive for back pain and myalgias. Negative for gait problem.  Skin: Negative for rash.  Neurological: Negative for light-headedness and headaches.  Psychiatric/Behavioral: Negative for agitation and behavioral problems.  All other systems reviewed and are negative.   Per HPI unless specifically indicated above     Medication List       Accurate as of 01/18/16 10:44 AM. Always use your most recent med list.          albuterol 108 (90 Base) MCG/ACT inhaler Commonly known as:  PROVENTIL HFA;VENTOLIN HFA Inhale 2 puffs into the lungs every 4 (four) hours as needed for wheezing or shortness of breath.   albuterol (2.5 MG/3ML) 0.083% nebulizer solution Commonly known as:  PROVENTIL Take 3 mLs (2.5 mg total) by nebulization every 4 (four) hours as needed for wheezing or shortness of breath.   atorvastatin 20 MG tablet Commonly known as:  LIPITOR Take 1 tablet (20 mg total) by mouth daily.   cetirizine 10 MG tablet Commonly known as:  ZYRTEC TAKE 1 TABLET ONCE DAILY   cloNIDine 0.1 MG tablet Commonly known as:  CATAPRES TAKE ONE TABLET AT BEDTIME   Clotrimazole 1 % Oint Apply 1 application topically 2 (two) times daily as needed (SKIN RASHES).   diclofenac sodium 1 % Gel Commonly known as:  VOLTAREN Apply to affected area twice daily as needed   divalproex 500 MG DR tablet Commonly  known as:  DEPAKOTE TAKE 2 TABLETS AT BEDTIME   doxepin 25 MG capsule Commonly known as:  SINEQUAN TAKE (1) CAPSULE DAILY   DULoxetine 60 MG capsule Commonly known as:  CYMBALTA TAKE (1) CAPSULE DAILY   fluticasone 50 MCG/ACT nasal spray Commonly known as:  FLONASE Place 1 spray into both nostrils 2 (two) times daily as needed for allergies or rhinitis.   fluticasone furoate-vilanterol 100-25 MCG/INH Aepb Commonly known as:  BREO ELLIPTA Inhale 1 puff  into the lungs daily.   levothyroxine 112 MCG tablet Commonly known as:  SYNTHROID, LEVOTHROID Take 1 tablet (112 mcg total) by mouth daily before breakfast. For hypothyroidism.   lisinopril-hydrochlorothiazide 20-25 MG tablet Commonly known as:  PRINZIDE,ZESTORETIC Take 1 tablet by mouth daily.   meloxicam 7.5 MG tablet Commonly known as:  MOBIC Take 1 tablet (7.5 mg total) by mouth daily.   multivitamin with minerals Tabs tablet Take 1 tablet by mouth daily. For nutritional supplementation.   omeprazole 20 MG capsule Commonly known as:  PRILOSEC TAKE (1) CAPSULE DAILY   risperiDONE 1 MG tablet Commonly known as:  RISPERDAL TAKE 1 & 1/2 TABLET AT BEDTIME          Objective:    BP 135/85 (BP Location: Left Arm, Patient Position: Sitting, Cuff Size: Large)   Pulse 75   Temp 98.9 F (37.2 C) (Oral)   Ht 5\' 9"  (1.753 m)   Wt (!) 375 lb 12.8 oz (170.5 kg)   LMP 02/18/1995   BMI 55.50 kg/m   Wt Readings from Last 3 Encounters:  01/18/16 (!) 375 lb 12.8 oz (170.5 kg)  12/03/15 (!) 373 lb (169.2 kg)  11/08/15 (!) 367 lb 9.6 oz (166.7 kg)    Physical Exam  Constitutional: She is oriented to person, place, and time. She appears well-developed and well-nourished. No distress.  Eyes: Conjunctivae and EOM are normal. Pupils are equal, round, and reactive to light.  Neck: Neck supple. No thyromegaly present.  Cardiovascular: Normal rate, regular rhythm, normal heart sounds and intact distal pulses.   No murmur heard. Pulmonary/Chest: Effort normal and breath sounds normal. No respiratory distress. She has no wheezes.  Abdominal: Soft. Normal appearance and bowel sounds are normal. There is no hepatosplenomegaly. There is no tenderness. There is no rigidity, no guarding, no CVA tenderness and negative Murphy's sign.  Musculoskeletal: Normal range of motion. She exhibits no edema.       Lumbar back: She exhibits tenderness (Paraspinal muscular tenderness, worse in the right  lower). She exhibits normal range of motion and no bony tenderness.  Lymphadenopathy:    She has no cervical adenopathy.  Neurological: She is alert and oriented to person, place, and time. Coordination normal.  Skin: Skin is warm and dry. No rash noted. She is not diaphoretic.  Psychiatric: She has a normal mood and affect. Her behavior is normal.  Nursing note and vitals reviewed.       Assessment & Plan:   Problem List Items Addressed This Visit      Other   Back pain, chronic   Relevant Medications   meloxicam (MOBIC) 7.5 MG tablet    Other Visit Diagnoses    Dysuria    -  Primary   Urinalysis showed very few bacteria and very few white blood cells, will run culture and treat if culture is positive   Relevant Orders   Urinalysis, Complete   Urine culture       Follow up plan: No Follow-up on  file.  Counseling provided for all of the vaccine components Orders Placed This Encounter  Procedures  . Urinalysis, Complete    [name], MD [facility] 01/18/2016, 10:44 AM

## 2016-01-18 NOTE — Telephone Encounter (Signed)
Yes go ahead and increase it to 15 mg. I did ask her about that but as she said she doesn't have good memory and could not remember.

## 2016-01-18 NOTE — Telephone Encounter (Signed)
New Rx for Meloxicam 15 mg sent to [facility] per Dr. Warrick Parisian.

## 2016-01-20 ENCOUNTER — Other Ambulatory Visit: Payer: Self-pay | Admitting: Family Medicine

## 2016-01-20 DIAGNOSIS — G4733 Obstructive sleep apnea (adult) (pediatric): Secondary | ICD-10-CM | POA: Diagnosis not present

## 2016-01-20 DIAGNOSIS — R0902 Hypoxemia: Secondary | ICD-10-CM | POA: Diagnosis not present

## 2016-01-20 LAB — URINE CULTURE

## 2016-01-20 MED ORDER — CIPROFLOXACIN HCL 500 MG PO TABS: 500.0000 mg | ORAL_TABLET | Freq: Two times a day (BID) | ORAL | 0 refills | Status: DC

## 2016-01-21 ENCOUNTER — Other Ambulatory Visit: Payer: Self-pay | Admitting: Family Medicine

## 2016-01-21 DIAGNOSIS — E039 Hypothyroidism, unspecified: Secondary | ICD-10-CM

## 2016-01-21 DIAGNOSIS — J4531 Mild persistent asthma with (acute) exacerbation: Secondary | ICD-10-CM

## 2016-01-27 ENCOUNTER — Other Ambulatory Visit: Payer: Self-pay

## 2016-01-27 ENCOUNTER — Other Ambulatory Visit: Payer: Self-pay | Admitting: *Deleted

## 2016-01-27 MED ORDER — ALBUTEROL SULFATE HFA 108 (90 BASE) MCG/ACT IN AERS: 2.0000 | INHALATION_SPRAY | RESPIRATORY_TRACT | 5 refills | Status: DC | PRN

## 2016-01-28 DIAGNOSIS — M4806 Spinal stenosis, lumbar region: Secondary | ICD-10-CM | POA: Diagnosis not present

## 2016-01-28 DIAGNOSIS — I252 Old myocardial infarction: Secondary | ICD-10-CM | POA: Diagnosis not present

## 2016-01-28 DIAGNOSIS — E7800 Pure hypercholesterolemia, unspecified: Secondary | ICD-10-CM | POA: Diagnosis not present

## 2016-01-28 DIAGNOSIS — M5137 Other intervertebral disc degeneration, lumbosacral region: Secondary | ICD-10-CM | POA: Diagnosis not present

## 2016-01-28 DIAGNOSIS — M5416 Radiculopathy, lumbar region: Secondary | ICD-10-CM | POA: Diagnosis not present

## 2016-01-28 DIAGNOSIS — M961 Postlaminectomy syndrome, not elsewhere classified: Secondary | ICD-10-CM | POA: Diagnosis not present

## 2016-01-28 DIAGNOSIS — Z79899 Other long term (current) drug therapy: Secondary | ICD-10-CM | POA: Diagnosis not present

## 2016-01-28 DIAGNOSIS — J449 Chronic obstructive pulmonary disease, unspecified: Secondary | ICD-10-CM | POA: Diagnosis not present

## 2016-01-28 DIAGNOSIS — I2510 Atherosclerotic heart disease of native coronary artery without angina pectoris: Secondary | ICD-10-CM | POA: Diagnosis not present

## 2016-01-28 DIAGNOSIS — I10 Essential (primary) hypertension: Secondary | ICD-10-CM | POA: Diagnosis not present

## 2016-01-28 DIAGNOSIS — Z886 Allergy status to analgesic agent status: Secondary | ICD-10-CM | POA: Diagnosis not present

## 2016-01-28 DIAGNOSIS — Z888 Allergy status to other drugs, medicaments and biological substances status: Secondary | ICD-10-CM | POA: Diagnosis not present

## 2016-01-28 DIAGNOSIS — G894 Chronic pain syndrome: Secondary | ICD-10-CM | POA: Diagnosis not present

## 2016-02-03 DIAGNOSIS — M961 Postlaminectomy syndrome, not elsewhere classified: Secondary | ICD-10-CM | POA: Diagnosis not present

## 2016-02-04 ENCOUNTER — Other Ambulatory Visit (HOSPITAL_COMMUNITY): Payer: Self-pay | Admitting: Psychiatry

## 2016-02-04 DIAGNOSIS — F3160 Bipolar disorder, current episode mixed, unspecified: Secondary | ICD-10-CM

## 2016-02-07 ENCOUNTER — Other Ambulatory Visit (HOSPITAL_COMMUNITY): Payer: Self-pay | Admitting: Psychiatry

## 2016-02-07 DIAGNOSIS — F3160 Bipolar disorder, current episode mixed, unspecified: Secondary | ICD-10-CM

## 2016-02-09 ENCOUNTER — Other Ambulatory Visit (HOSPITAL_COMMUNITY): Payer: Self-pay | Admitting: Psychiatry

## 2016-02-09 DIAGNOSIS — F3160 Bipolar disorder, current episode mixed, unspecified: Secondary | ICD-10-CM

## 2016-02-10 DIAGNOSIS — R0902 Hypoxemia: Secondary | ICD-10-CM | POA: Diagnosis not present

## 2016-02-10 DIAGNOSIS — G4733 Obstructive sleep apnea (adult) (pediatric): Secondary | ICD-10-CM | POA: Diagnosis not present

## 2016-02-14 ENCOUNTER — Ambulatory Visit (HOSPITAL_COMMUNITY): Payer: Self-pay | Admitting: Psychiatry

## 2016-02-15 ENCOUNTER — Other Ambulatory Visit (HOSPITAL_COMMUNITY): Payer: Self-pay | Admitting: Psychiatry

## 2016-02-15 ENCOUNTER — Other Ambulatory Visit: Payer: Self-pay | Admitting: Family Medicine

## 2016-02-15 DIAGNOSIS — F3160 Bipolar disorder, current episode mixed, unspecified: Secondary | ICD-10-CM

## 2016-03-01 ENCOUNTER — Telehealth (HOSPITAL_COMMUNITY): Payer: Self-pay | Admitting: *Deleted

## 2016-03-01 ENCOUNTER — Ambulatory Visit: Payer: Self-pay | Admitting: Gastroenterology

## 2016-03-01 ENCOUNTER — Ambulatory Visit: Payer: Self-pay | Admitting: Physical Therapy

## 2016-03-01 NOTE — Telephone Encounter (Signed)
Left message on voicemail per DPR in reference to upcoming appointment scheduled on 03/06/16 with detailed instructions given per Myocardial Perfusion Study Information Sheet for the test. LM to arrive 15 minutes early, and that it is imperative to arrive on time for appointment to keep from having the test rescheduled. If you need to cancel or reschedule your appointment, please call the office within 24 hours of your appointment. Failure to do so may result in a cancellation of your appointment, and a $50 no show fee. Phone number given for call back for any questions. Kirstie Peri

## 2016-03-03 DIAGNOSIS — E7800 Pure hypercholesterolemia, unspecified: Secondary | ICD-10-CM | POA: Diagnosis not present

## 2016-03-03 DIAGNOSIS — I10 Essential (primary) hypertension: Secondary | ICD-10-CM | POA: Diagnosis not present

## 2016-03-03 DIAGNOSIS — E079 Disorder of thyroid, unspecified: Secondary | ICD-10-CM | POA: Diagnosis not present

## 2016-03-03 DIAGNOSIS — G8929 Other chronic pain: Secondary | ICD-10-CM | POA: Diagnosis not present

## 2016-03-03 DIAGNOSIS — I2510 Atherosclerotic heart disease of native coronary artery without angina pectoris: Secondary | ICD-10-CM | POA: Diagnosis not present

## 2016-03-03 DIAGNOSIS — M5416 Radiculopathy, lumbar region: Secondary | ICD-10-CM | POA: Diagnosis not present

## 2016-03-04 ENCOUNTER — Other Ambulatory Visit (HOSPITAL_COMMUNITY): Payer: Self-pay | Admitting: Psychiatry

## 2016-03-04 DIAGNOSIS — F3160 Bipolar disorder, current episode mixed, unspecified: Secondary | ICD-10-CM

## 2016-03-06 ENCOUNTER — Ambulatory Visit: Payer: Self-pay | Admitting: Physical Therapy

## 2016-03-06 ENCOUNTER — Ambulatory Visit (HOSPITAL_COMMUNITY): Payer: Medicare Other

## 2016-03-06 ENCOUNTER — Other Ambulatory Visit (HOSPITAL_COMMUNITY): Payer: Self-pay

## 2016-03-07 ENCOUNTER — Ambulatory Visit (HOSPITAL_COMMUNITY): Payer: Self-pay

## 2016-03-09 ENCOUNTER — Other Ambulatory Visit (HOSPITAL_COMMUNITY): Payer: Self-pay | Admitting: Psychiatry

## 2016-03-09 DIAGNOSIS — F3160 Bipolar disorder, current episode mixed, unspecified: Secondary | ICD-10-CM

## 2016-03-11 DIAGNOSIS — G4733 Obstructive sleep apnea (adult) (pediatric): Secondary | ICD-10-CM | POA: Diagnosis not present

## 2016-03-11 DIAGNOSIS — R0902 Hypoxemia: Secondary | ICD-10-CM | POA: Diagnosis not present

## 2016-03-14 ENCOUNTER — Encounter: Payer: Self-pay | Admitting: Psychiatry

## 2016-03-14 NOTE — Telephone Encounter (Signed)
Met with Dr. Lovena Le, helping to cover for Dr. Adele Schilder out this date who approved a one time refill of patient's prescribed Risperdal 1 mg, 1 and 1/2 tablet daily at bedtime, #45 with no refills.  New order e-scribed to patient's [facility] as approved by Dr. Lovena Le.

## 2016-03-15 ENCOUNTER — Ambulatory Visit: Payer: Self-pay | Admitting: Gastroenterology

## 2016-03-15 ENCOUNTER — Ambulatory Visit (INDEPENDENT_AMBULATORY_CARE_PROVIDER_SITE_OTHER): Payer: Medicare Other | Admitting: Family Medicine

## 2016-03-15 ENCOUNTER — Encounter: Payer: Self-pay | Admitting: Family Medicine

## 2016-03-15 VITALS — BP 132/86 | HR 75 | Temp 98.3°F | Ht 69.0 in | Wt 379.5 lb

## 2016-03-15 DIAGNOSIS — L739 Follicular disorder, unspecified: Secondary | ICD-10-CM

## 2016-03-15 DIAGNOSIS — Z13820 Encounter for screening for osteoporosis: Secondary | ICD-10-CM

## 2016-03-15 MED ORDER — DICLOFENAC SODIUM 1 % TD GEL: TRANSDERMAL | 5 refills | Status: DC

## 2016-03-15 NOTE — Progress Notes (Signed)
BP 132/86   Pulse 75   Temp 98.3 F (36.8 C) (Oral)   Ht 5\' 9"  (1.753 m)   Wt (!) 379 lb 8 oz (172.1 kg)   LMP 02/18/1995   BMI 56.04 kg/m    Subjective:    Patient ID: Allison Sharp, female    DOB: 07/18/1955, 60 y.o.   MRN: [id number]  HPI: Allison Sharp is a 60 y.o. female presenting on 03/15/2016 for Lesion on back of neck (has been there for a while and will not heal) and Joint Pain (patient reports she hurts all over, curious to see if she has arthritis, has used Voltaren gel in past and would like another prescription)   HPI Skin spot on the back of her neck Patient comes in because she has 2 small spots on the skin on the back of her neck that are swollen and irritated and have been bothering her for the past month. She denies any fevers or chills or drainage out of the sites. She thought they were getting better but then they've gotten worse over the past few days. The one on the left has been causing more irritation and pain recently.  Medication refill on her Voltaren gel  Patient wants an osteoporosis screening with a DEXA scan as she has never had one. She wants to know if this may related or joint pain.  Relevant past medical, surgical, family and social history reviewed and updated as indicated. Interim medical history since our last visit reviewed. Allergies and medications reviewed and updated.  Review of Systems  Constitutional: Negative for chills and fever.  HENT: Negative for congestion, ear discharge and ear pain.   Eyes: Negative for redness and visual disturbance.  Respiratory: Negative for chest tightness and shortness of breath.   Cardiovascular: Negative for chest pain and leg swelling.  Genitourinary: Negative for difficulty urinating and dysuria.  Musculoskeletal: Positive for arthralgias (Chronic). Negative for back pain and gait problem.  Skin: Positive for color change and rash.  Neurological: Negative for light-headedness and headaches.    Psychiatric/Behavioral: Negative for agitation and behavioral problems.  All other systems reviewed and are negative.   Per HPI unless specifically indicated above     Objective:    BP 132/86   Pulse 75   Temp 98.3 F (36.8 C) (Oral)   Ht 5\' 9"  (1.753 m)   Wt (!) 379 lb 8 oz (172.1 kg)   LMP 02/18/1995   BMI 56.04 kg/m   Wt Readings from Last 3 Encounters:  03/15/16 (!) 379 lb 8 oz (172.1 kg)  01/18/16 (!) 375 lb 12.8 oz (170.5 kg)  12/03/15 (!) 373 lb (169.2 kg)    Physical Exam  Constitutional: She is oriented to person, place, and time. She appears well-developed and well-nourished. No distress.  Eyes: Conjunctivae are normal.  Cardiovascular: Normal rate, regular rhythm, normal heart sounds and intact distal pulses.   No murmur heard. Pulmonary/Chest: Effort normal and breath sounds normal. No respiratory distress. She has no wheezes.  Musculoskeletal: Normal range of motion. She exhibits no edema.  Neurological: She is alert and oriented to person, place, and time. Coordination normal.  Skin: Skin is warm and dry. Rash (Patient has 2 small lesions that are papules and have a slight scale on top of them. The one on the left does have a small band of erythema around it. No purulent drainage or fluctuance or induration noted.) noted. She is not diaphoretic.  Psychiatric: She has a  normal mood and affect. Her behavior is normal.  Nursing note and vitals reviewed.     Assessment & Plan:   Problem List Items Addressed This Visit    None    Visit Diagnoses    Folliculitis    -  Primary   2 small spots of folliculitis and one has surrounding erythema and one has slight scale on the nape of her neck near her hairline. Topical antibiotic and fungal   Osteoporosis screening       Relevant Orders   DG Bone Density       Follow up plan: Return if symptoms worsen or fail to improve.  Counseling provided for all of the vaccine components No orders of the defined types  were placed in this encounter.   [name], MD [facility] 03/15/2016, 10:21 AM

## 2016-03-16 ENCOUNTER — Other Ambulatory Visit (HOSPITAL_COMMUNITY): Payer: Self-pay | Admitting: Psychiatry

## 2016-03-16 DIAGNOSIS — F3160 Bipolar disorder, current episode mixed, unspecified: Secondary | ICD-10-CM

## 2016-03-22 ENCOUNTER — Other Ambulatory Visit: Payer: Self-pay | Admitting: Family Medicine

## 2016-03-29 ENCOUNTER — Telehealth (HOSPITAL_COMMUNITY): Payer: Self-pay | Admitting: *Deleted

## 2016-03-29 DIAGNOSIS — M48062 Spinal stenosis, lumbar region with neurogenic claudication: Secondary | ICD-10-CM | POA: Insufficient documentation

## 2016-03-29 NOTE — Telephone Encounter (Signed)
Patient given detailed instructions per Myocardial Perfusion Study Information Sheet for the test on 04/04/16 at 1230. Patient notified to arrive 15 minutes early and that it is imperative to arrive on time for appointment to keep from having the test rescheduled.  If you need to cancel or reschedule your appointment, please call the office within 24 hours of your appointment. Failure to do so may result in a cancellation of your appointment, and a $50 no show fee. Patient verbalized understanding.Harjit Douds, Ranae [place]

## 2016-03-30 ENCOUNTER — Encounter (HOSPITAL_COMMUNITY): Payer: Self-pay | Admitting: Psychiatry

## 2016-03-30 ENCOUNTER — Ambulatory Visit (INDEPENDENT_AMBULATORY_CARE_PROVIDER_SITE_OTHER): Payer: 59 | Admitting: Psychiatry

## 2016-03-30 DIAGNOSIS — Z79899 Other long term (current) drug therapy: Secondary | ICD-10-CM

## 2016-03-30 DIAGNOSIS — F3160 Bipolar disorder, current episode mixed, unspecified: Secondary | ICD-10-CM

## 2016-03-30 MED ORDER — DIVALPROEX SODIUM 500 MG PO DR TAB: 1000.0000 mg | DELAYED_RELEASE_TABLET | Freq: Every day | ORAL | 2 refills | Status: DC

## 2016-03-30 MED ORDER — DOXEPIN HCL 25 MG PO CAPS: ORAL_CAPSULE | ORAL | 2 refills | Status: DC

## 2016-03-30 MED ORDER — RISPERIDONE 2 MG PO TABS: 2.0000 mg | ORAL_TABLET | Freq: Every day | ORAL | 2 refills | Status: DC

## 2016-03-30 NOTE — Progress Notes (Signed)
[facility] [phone] Progress Note  Allison Sharp [id number] 60 y.o.  03/30/2016 11:30 AM  Chief Complaint:  I'm doing fine but is still have lack of sleep and in an thoughts at night.    History of Present Illness:  Allison Sharp came for her follow-up appointment.  She is taking her medication as prescribed and reported no side effects.  Lately she is complaining of poor sleep, racing thoughts and some time irritability.  Overall she described her mood is better since she stopped talking to her niece .  Recently she's seen her primary care physician and had blood work which was normal.  Her hemoglobin A1c is 5.8 and Depakote level is 85.3.  She like to try sleep medication however in the past we have given sleep medication that causes excessive drowsiness and dizziness.  Her paranoia and mania is well controlled.  She is very excited to having upcoming trips with her sister.  They're planning to visit [place], [place] and [place].  Patient has no tremors, shakes, EPS.  Her appetite is fair.  Her energy level is okay.  She is taking Risperdal, Depakote, Sinequan.  Patient denies any alcohol or using any illegal substances.  Suicidal Ideation: No Plan Formed: No Patient has means to carry out plan: No  Homicidal Ideation: No Plan Formed: No Patient has means to carry out plan: No  Past Psychiatric History/Hospitalization(s): Patient has history of psychiatric illness and treatment.  She has at least 4-5 psychiatric hospitalization.  She has history of suicidal attempt in the past by taking overdose on her medication.  She has diagnosed with PTSD, major depressive disorder and bipolar disorder.  In the past she had tried Paxil, Prozac, Wellbutrin, Effexor, Lexapro, amitriptyline, Cymbalta, Neurontin, Depakote, trazodone, Thorazine.  During her last psychiatric hospitalization she was given hydroxyzine, Luvox, Sinequan, Zyprexa .  Recently she was given [place] however to stop due to poor  response.  She was seeing this Probation officer in [place] and then Dr. Gilford Rile.  She has history of psychosis, mania, severe mood swing and depression. Anxiety: Yes Bipolar Disorder: Yes Depression: Yes Mania: Yes Psychosis: Yes Schizophrenia: No Personality Disorder: No Hospitalization for psychiatric illness: Yes History of Electroconvulsive Shock Therapy: No Prior Suicide Attempts: Yes  Medical History; Patient has multiple health problem.  She has hyperlipidemia, GERD, chronic back pain, arthritis, sleep apnea, hypertension, COPD, obesity, thyroid problem, coronary artery disease, headaches and she had history of knee arthroscopy, tonsillectomy, back surgery, hysterectomy, cholecystectomy, hernia repair, joint replacement and appendectomy.  Family History; Patient sister, nieces, son has psychiatric issues.  Her sister and niece has been seen in this office.  Her son has drug problem.  Review of Systems: Psychiatric: Agitation: Irritability Hallucination: No Depressed Mood: Yes Insomnia: Yes Hypersomnia: No Altered Concentration: No Feels Worthless: No Grandiose Ideas: No Belief In Special Powers: No New/Increased Substance Abuse: No Compulsions: No  Neurologic: Headache: No Seizure: No Paresthesias: No   Outpatient Encounter Prescriptions as of 03/30/2016  Medication Sig  . albuterol (PROVENTIL HFA;VENTOLIN HFA) 108 (90 Base) MCG/ACT inhaler Inhale 2 puffs into the lungs every 4 (four) hours as needed for wheezing or shortness of breath.  Marland Kitchen albuterol (PROVENTIL) (2.5 MG/3ML) 0.083% nebulizer solution Take 3 mLs (2.5 mg total) by nebulization every 4 (four) hours as needed for wheezing or shortness of breath.  Marland Kitchen atorvastatin (LIPITOR) 20 MG tablet Take 1 tablet (20 mg total) by mouth daily.  Marland Kitchen BREO ELLIPTA 100-25 MCG/INH AEPB INHALE 1 PUFF INTO THE LUNGS  DAILY  . cetirizine (ZYRTEC) 10 MG tablet TAKE 1 TABLET ONCE DAILY  . cloNIDine (CATAPRES) 0.1 MG tablet TAKE ONE TABLET AT  BEDTIME  . Clotrimazole 1 % OINT Apply 1 application topically 2 (two) times daily as needed (SKIN RASHES).  Marland Kitchen diclofenac sodium (VOLTAREN) 1 % GEL Apply to affected area twice daily as needed  . divalproex (DEPAKOTE) 500 MG DR tablet Take 2 tablets (1,000 mg total) by mouth at bedtime.  Marland Kitchen doxepin (SINEQUAN) 25 MG capsule TAKE (1) CAPSULE DAILY  . DULoxetine (CYMBALTA) 60 MG capsule TAKE (1) CAPSULE DAILY  . fluticasone (FLONASE) 50 MCG/ACT nasal spray Place 1 spray into both nostrils 2 (two) times daily as needed for allergies or rhinitis.  Marland Kitchen levothyroxine (SYNTHROID, LEVOTHROID) 112 MCG tablet TAKE (1) TABLET DAILY BE- FORE BREAKFAST FOR THYROID  . lisinopril-hydrochlorothiazide (PRINZIDE,ZESTORETIC) 20-25 MG tablet Take 1 tablet by mouth daily.  . meloxicam (MOBIC) 15 MG tablet Take 1 tablet (15 mg total) by mouth daily.  . Multiple Vitamin (MULTIVITAMIN WITH MINERALS) TABS Take 1 tablet by mouth daily. For nutritional supplementation.  Marland Kitchen omeprazole (PRILOSEC) 20 MG capsule TAKE (1) CAPSULE DAILY  . risperiDONE (RISPERDAL) 2 MG tablet Take 1 tablet (2 mg total) by mouth at bedtime.  . [DISCONTINUED] ciprofloxacin (CIPRO) 500 MG tablet Take 1 tablet (500 mg total) by mouth 2 (two) times daily.  . [DISCONTINUED] divalproex (DEPAKOTE) 500 MG DR tablet TAKE 2 TABLETS AT BEDTIME  . [DISCONTINUED] doxepin (SINEQUAN) 25 MG capsule TAKE (1) CAPSULE DAILY  . [DISCONTINUED] risperiDONE (RISPERDAL) 1 MG tablet TAKE 1 & 1/2 TABLET AT BEDTIME   No facility-administered encounter medications on file as of 03/30/2016.     Recent Results (from the past 2160 hour(s))  Urinalysis, Complete     Status: Abnormal   Collection Time: 01/18/16 10:45 AM  Result Value Ref Range   Specific Gravity, UA 1.020 1.005 - 1.030   pH, UA 7.0 5.0 - 7.5   Color, UA Yellow Yellow   Appearance Ur Clear Clear   Leukocytes, UA Negative Negative   Protein, UA Negative Negative/Trace   Glucose, UA Negative Negative    Ketones, UA Negative Negative   RBC, UA Negative Negative   Bilirubin, UA Negative Negative   Urobilinogen, Ur 4.0 (H) 0.2 - 1.0 mg/dL   Nitrite, UA Negative Negative   Microscopic Examination See below:   Microscopic Examination     Status: None   Collection Time: 01/18/16 10:45 AM  Result Value Ref Range   WBC, UA 0-5 0 - 5 /hpf   RBC, UA None seen 0 - 2 /hpf   Epithelial Cells (non renal) 0-10 0 - 10 /hpf   Bacteria, UA Few None seen/Few  Urine culture     Status: Abnormal   Collection Time: 01/18/16 11:11 AM  Result Value Ref Range   Urine Culture, Routine Final report (A)    Urine Culture result 1 Escherichia coli (A)     Comment: Greater than 100,000 colony forming units per mL Cefazolin <=4 ug/mL Cefazolin with an MIC <=16 predicts susceptibility to the oral agents cefaclor, cefdinir, cefpodoxime, cefprozil, cefuroxime, cephalexin, and loracarbef when used for therapy of uncomplicated urinary tract infections due to E. coli, Klebsiella pneumoniae, and Proteus mirabilis.    ANTIMICROBIAL SUSCEPTIBILITY Comment     Comment:       ** S = Susceptible; I = Intermediate; R = Resistant **  P = Positive; N = Negative             MICS are expressed in micrograms per mL    Antibiotic                 RSLT#1    RSLT#2    RSLT#3    RSLT#4 Amoxicillin/Clavulanic Acid    S Ampicillin                     S Cefepime                       S Ceftriaxone                    S Cefuroxime                     S Cephalothin                    I Ciprofloxacin                  S Ertapenem                      S Gentamicin                     R Imipenem                       S Levofloxacin                   S Nitrofurantoin                 S Piperacillin                   S Tetracycline                   R Tobramycin                     S Trimethoprim/Sulfa             S       Constitutional:  BP 126/78   Pulse 92   Ht 5\' 9"  (1.753 m)   Wt (!) 385 lb 12.8 oz (175  kg)   LMP 02/18/1995   BMI 56.97 kg/m    Musculoskeletal: Strength & Muscle Tone: decreased Gait & Station: normal Patient leans: N/A  Psychiatric Specialty Exam: General Appearance: Casual  Eye Contact::  Fair  Speech:  Normal Rate  Volume:  Normal  Mood:  Euthymic  Affect:  Labile  Thought Process:  Coherent  Orientation:  Full (Time, Place, and Person)  Thought Content:  Hallucinations: Noncommand auditory hallucination and Rumination  Suicidal Thoughts:  No  Homicidal Thoughts:  No  Memory:  Immediate;   Fair Recent;   Fair Remote;   Fair  Judgement:  Intact  Insight:  Fair  Psychomotor Activity:  Normal  Concentration:  Fair  Recall:  [employer] of Knowledge:  Fair  Language:  Fair  Akathisia:  No  Handed:  Right  AIMS (if indicated):     Assets:  Communication Skills Desire for Improvement Housing  ADL's:  Intact  Cognition:  Impaired,  Mild  Sleep:        Established Problem, Stable/Improving (1), Review of Psycho-Social Stressors (1), Review or order clinical lab tests (1),  Decision to obtain old records (1), Review and summation of old records (2), Established Problem, Worsening (2), Review of Last Therapy Session (1), Review of Medication Regimen & Side Effects (2) and Review of New Medication or Change in Dosage (2)  Assessment: Axis I: Bipolar disorder mixed.  Rule out schizoaffective disorder depressed type, PTSD, rule out major depressive disorder recurrent  Axis II: Deferred  Axis III:  Past Medical History:  Diagnosis Date  . Allergy   . Anxiety   . Arthritis   . Asthma   . Bipolar 1 disorder ([place])   . CAD (coronary artery disease)   . Cellulitis   . CHF (congestive heart failure) ([facility])    diastolic dysfunction  . Chronic back pain   . Chronic headaches   . Complication of anesthesia    States she typically gets sick s/p anesthesia  . Contusion of sacrum   . COPD (chronic obstructive pulmonary disease) ([place])   . Depression   .  Dyspnea    PFT 03/05/09 FEV1 2.77(98%), FVC 3.25(86%), FEV1% 85, TLC 5.88(99%), DLCO 60% ,  Methacholine challenge 03/16/09 normal ,  CT chest 03/12/09 no pulmonary disease  . Fungal infection   . GERD (gastroesophageal reflux disease)   . History of colonoscopy 10/17/2002   by Dr Rehman-> distal non-specific proctitis, small ext hemorrhoids,   . HTN (hypertension)   . Hyperlipidemia   . Hyperthyroidism   . Hypothyroidism    States she only has hyperthyroidism  . Migraine headache   . Morbid obesity with body mass index of 50.0-59.9 in adult [facility]) JAN 2011 370 LBS   2004 311 BMI 45.9  . Myocardial infarction    NOV 1997  . OSA on CPAP    she had been on 2L O2 at night but that was stopped  . PONV (postoperative nausea and vomiting)   . PTSD (post-traumatic stress disorder)   . Suicidal ideation   . Urine incontinence   . Vitamin D deficiency      Plan:  I reviewed her psychosocial stressors, Current medication, recent blood work results and collateral information from other provider.  Her Depakote level is 85.3 and hemoglobin A1c is 5.8.  We discuss adjusting her Risperdal since she continues to have racing thoughts and insomnia and irritability.  We'll increase Risperdal 2 mg at bedtime.  I will continue Sinequan 25 mg at bedtime and Cymbalta 60 mg daily.  Patient does not want counseling at this time. She has not seen Allison Sharp in a while.  Recommended to call us back if she has any question, concern if she feels worsening of the symptom.  This visit was 30 minutes and more than 50% of the time spent in psychoeducation, counseling, discussing ongoing stress .  Discussed relaxation technique and breathing exercise.  Follow-up in 3 months.    Kadey Mihalic T., MD 03/30/2016

## 2016-03-31 ENCOUNTER — Other Ambulatory Visit: Payer: Self-pay | Admitting: Family Medicine

## 2016-04-03 ENCOUNTER — Other Ambulatory Visit: Payer: Self-pay

## 2016-04-03 ENCOUNTER — Ambulatory Visit (HOSPITAL_BASED_OUTPATIENT_CLINIC_OR_DEPARTMENT_OTHER): Payer: Medicare Other

## 2016-04-03 ENCOUNTER — Ambulatory Visit (HOSPITAL_COMMUNITY): Payer: Medicare Other | Attending: Cardiovascular Disease

## 2016-04-03 DIAGNOSIS — G4733 Obstructive sleep apnea (adult) (pediatric): Secondary | ICD-10-CM

## 2016-04-03 DIAGNOSIS — I2510 Atherosclerotic heart disease of native coronary artery without angina pectoris: Secondary | ICD-10-CM | POA: Insufficient documentation

## 2016-04-03 DIAGNOSIS — E785 Hyperlipidemia, unspecified: Secondary | ICD-10-CM | POA: Insufficient documentation

## 2016-04-03 DIAGNOSIS — R0602 Shortness of breath: Secondary | ICD-10-CM

## 2016-04-03 DIAGNOSIS — Z9989 Dependence on other enabling machines and devices: Secondary | ICD-10-CM

## 2016-04-03 DIAGNOSIS — Z6841 Body Mass Index (BMI) 40.0 and over, adult: Secondary | ICD-10-CM | POA: Insufficient documentation

## 2016-04-03 DIAGNOSIS — I10 Essential (primary) hypertension: Secondary | ICD-10-CM

## 2016-04-03 DIAGNOSIS — R9439 Abnormal result of other cardiovascular function study: Secondary | ICD-10-CM | POA: Diagnosis not present

## 2016-04-03 MED ORDER — REGADENOSON 0.4 MG/5ML IV SOLN
0.4000 mg | Freq: Once | INTRAVENOUS | Status: AC
  Administered 2016-04-03: 0.4 mg via INTRAVENOUS

## 2016-04-03 MED ORDER — TECHNETIUM TC 99M TETROFOSMIN IV KIT
32.2000 | PACK | Freq: Once | INTRAVENOUS | Status: AC | PRN
  Administered 2016-04-03: 32.2 via INTRAVENOUS
  Filled 2016-04-03: qty 33

## 2016-04-04 ENCOUNTER — Encounter: Payer: Self-pay | Admitting: Cardiology

## 2016-04-04 ENCOUNTER — Ambulatory Visit (HOSPITAL_COMMUNITY): Payer: Medicare Other | Attending: Cardiology

## 2016-04-04 LAB — MYOCARDIAL PERFUSION IMAGING
CHL CUP NUCLEAR SDS: 3
CHL CUP NUCLEAR SRS: 3
CHL CUP NUCLEAR SSS: 6
CHL CUP RESTING HR STRESS: 81 {beats}/min
CSEPPHR: 92 {beats}/min
LHR: 0.29
LV sys vol: 45 mL
LVDIAVOL: 122 mL (ref 46–106)
TID: 0.86

## 2016-04-04 MED ORDER — TECHNETIUM TC 99M TETROFOSMIN IV KIT
32.7000 | PACK | Freq: Once | INTRAVENOUS | Status: AC | PRN
  Administered 2016-04-04: 32.7 via INTRAVENOUS
  Filled 2016-04-04: qty 33

## 2016-04-04 NOTE — Telephone Encounter (Signed)
Pt is returning phone call ° °

## 2016-04-04 NOTE — Telephone Encounter (Signed)
New messages   Patient calling stating someone call her today for test results. Echo was done on yesterday & Nuclear was completed today.

## 2016-04-04 NOTE — Telephone Encounter (Signed)
This encounter was created in error - please disregard.

## 2016-04-05 ENCOUNTER — Other Ambulatory Visit: Payer: Self-pay | Admitting: Family Medicine

## 2016-04-05 ENCOUNTER — Other Ambulatory Visit (HOSPITAL_COMMUNITY): Payer: Self-pay | Admitting: Psychiatry

## 2016-04-05 DIAGNOSIS — F3160 Bipolar disorder, current episode mixed, unspecified: Secondary | ICD-10-CM

## 2016-04-06 ENCOUNTER — Telehealth: Payer: Self-pay | Admitting: Cardiology

## 2016-04-06 NOTE — Telephone Encounter (Signed)
New Message  Pt call about test results. Please call back to discuss

## 2016-04-06 NOTE — Telephone Encounter (Signed)
Left message to call back  

## 2016-04-07 ENCOUNTER — Other Ambulatory Visit (HOSPITAL_COMMUNITY): Payer: Self-pay

## 2016-04-07 ENCOUNTER — Other Ambulatory Visit (HOSPITAL_COMMUNITY): Payer: Self-pay | Admitting: Psychiatry

## 2016-04-07 DIAGNOSIS — F3160 Bipolar disorder, current episode mixed, unspecified: Secondary | ICD-10-CM

## 2016-04-11 DIAGNOSIS — G4733 Obstructive sleep apnea (adult) (pediatric): Secondary | ICD-10-CM | POA: Diagnosis not present

## 2016-04-11 DIAGNOSIS — R0902 Hypoxemia: Secondary | ICD-10-CM | POA: Diagnosis not present

## 2016-04-11 NOTE — Telephone Encounter (Signed)
Informed patient of echocardiogram and myoview results and verbal understanding expressed.

## 2016-04-11 NOTE — Telephone Encounter (Signed)
-----   Message from Sueanne Margarita, MD sent at 04/03/2016  6:24 PM EST ----- Echo showed normal LVF with mild LVH and mild LAE

## 2016-04-11 NOTE — Telephone Encounter (Signed)
-----   Message from Sueanne Margarita, MD sent at 04/04/2016  3:09 PM EST ----- Please let patient know that stress test was fine

## 2016-04-13 DIAGNOSIS — M961 Postlaminectomy syndrome, not elsewhere classified: Secondary | ICD-10-CM | POA: Diagnosis not present

## 2016-04-13 DIAGNOSIS — M48062 Spinal stenosis, lumbar region with neurogenic claudication: Secondary | ICD-10-CM | POA: Diagnosis not present

## 2016-05-08 ENCOUNTER — Ambulatory Visit: Payer: Medicare Other | Admitting: Pulmonary Disease

## 2016-05-10 ENCOUNTER — Other Ambulatory Visit: Payer: Self-pay | Admitting: Family Medicine

## 2016-05-11 DIAGNOSIS — G4733 Obstructive sleep apnea (adult) (pediatric): Secondary | ICD-10-CM | POA: Diagnosis not present

## 2016-05-11 DIAGNOSIS — R0902 Hypoxemia: Secondary | ICD-10-CM | POA: Diagnosis not present

## 2016-06-01 ENCOUNTER — Telehealth: Payer: Self-pay

## 2016-06-01 DIAGNOSIS — M545 Low back pain: Secondary | ICD-10-CM

## 2016-06-01 NOTE — Telephone Encounter (Signed)
Go ahead and do referral

## 2016-06-02 ENCOUNTER — Other Ambulatory Visit: Payer: Self-pay | Admitting: *Deleted

## 2016-06-02 MED ORDER — ALBUTEROL SULFATE (2.5 MG/3ML) 0.083% IN NEBU: 2.5000 mg | INHALATION_SOLUTION | RESPIRATORY_TRACT | 0 refills | Status: DC | PRN

## 2016-06-02 NOTE — Telephone Encounter (Signed)
Referral placed.

## 2016-06-02 NOTE — Addendum Note (Signed)
Addended by: Karle Plumber on: 06/02/2016 09:42 AM   Modules accepted: Orders

## 2016-06-11 DIAGNOSIS — G4733 Obstructive sleep apnea (adult) (pediatric): Secondary | ICD-10-CM | POA: Diagnosis not present

## 2016-06-11 DIAGNOSIS — R0902 Hypoxemia: Secondary | ICD-10-CM | POA: Diagnosis not present

## 2016-06-16 ENCOUNTER — Other Ambulatory Visit (HOSPITAL_COMMUNITY): Payer: Self-pay | Admitting: Psychiatry

## 2016-06-16 DIAGNOSIS — F3160 Bipolar disorder, current episode mixed, unspecified: Secondary | ICD-10-CM

## 2016-06-19 ENCOUNTER — Other Ambulatory Visit (HOSPITAL_COMMUNITY): Payer: Self-pay | Admitting: Psychiatry

## 2016-06-19 DIAGNOSIS — F3160 Bipolar disorder, current episode mixed, unspecified: Secondary | ICD-10-CM

## 2016-06-19 MED ORDER — DULOXETINE HCL 60 MG PO CPEP: ORAL_CAPSULE | ORAL | 0 refills | Status: DC

## 2016-06-20 ENCOUNTER — Telehealth (HOSPITAL_COMMUNITY): Payer: Self-pay | Admitting: Psychiatry

## 2016-06-20 ENCOUNTER — Emergency Department (HOSPITAL_COMMUNITY)
Admission: EM | Admit: 2016-06-20 | Discharge: 2016-06-20 | Disposition: A | Discharge status: Other Acute Inpatient Hospital | Payer: Medicare Other | Attending: Emergency Medicine | Admitting: Emergency Medicine

## 2016-06-20 ENCOUNTER — Encounter (HOSPITAL_COMMUNITY): Payer: Self-pay | Admitting: Emergency Medicine

## 2016-06-20 DIAGNOSIS — R440 Auditory hallucinations: Secondary | ICD-10-CM

## 2016-06-20 DIAGNOSIS — I252 Old myocardial infarction: Secondary | ICD-10-CM | POA: Diagnosis not present

## 2016-06-20 DIAGNOSIS — E039 Hypothyroidism, unspecified: Secondary | ICD-10-CM | POA: Diagnosis not present

## 2016-06-20 DIAGNOSIS — Z96653 Presence of artificial knee joint, bilateral: Secondary | ICD-10-CM | POA: Insufficient documentation

## 2016-06-20 DIAGNOSIS — J449 Chronic obstructive pulmonary disease, unspecified: Secondary | ICD-10-CM | POA: Insufficient documentation

## 2016-06-20 DIAGNOSIS — I509 Heart failure, unspecified: Secondary | ICD-10-CM | POA: Insufficient documentation

## 2016-06-20 DIAGNOSIS — Z79899 Other long term (current) drug therapy: Secondary | ICD-10-CM | POA: Insufficient documentation

## 2016-06-20 DIAGNOSIS — I2510 Atherosclerotic heart disease of native coronary artery without angina pectoris: Secondary | ICD-10-CM | POA: Diagnosis not present

## 2016-06-20 DIAGNOSIS — I110 Hypertensive heart disease with heart failure: Secondary | ICD-10-CM | POA: Insufficient documentation

## 2016-06-20 LAB — CBC WITH DIFFERENTIAL/PLATELET
BASOS PCT: 0 %
Basophils Absolute: 0 10*3/uL (ref 0.0–0.1)
EOS ABS: 0.2 10*3/uL (ref 0.0–0.7)
Eosinophils Relative: 2 %
HCT: 40.3 % (ref 36.0–46.0)
HEMOGLOBIN: 13.7 g/dL (ref 12.0–15.0)
Lymphocytes Relative: 20 %
Lymphs Abs: 1.9 10*3/uL (ref 0.7–4.0)
MCH: 30.7 pg (ref 26.0–34.0)
MCHC: 34 g/dL (ref 30.0–36.0)
MCV: 90.4 fL (ref 78.0–100.0)
Monocytes Absolute: 0.6 10*3/uL (ref 0.1–1.0)
Monocytes Relative: 6 %
NEUTROS PCT: 72 %
Neutro Abs: 6.7 10*3/uL (ref 1.7–7.7)
Platelets: 182 10*3/uL (ref 150–400)
RBC: 4.46 MIL/uL (ref 3.87–5.11)
RDW: 13.1 % (ref 11.5–15.5)
WBC: 9.4 10*3/uL (ref 4.0–10.5)

## 2016-06-20 LAB — RAPID URINE DRUG SCREEN, HOSP PERFORMED
AMPHETAMINES: NOT DETECTED
BENZODIAZEPINES: NOT DETECTED
Barbiturates: NOT DETECTED
Cocaine: NOT DETECTED
Opiates: NOT DETECTED
TETRAHYDROCANNABINOL: NOT DETECTED

## 2016-06-20 LAB — COMPREHENSIVE METABOLIC PANEL
ALBUMIN: 4 g/dL (ref 3.5–5.0)
ALT: 14 U/L (ref 14–54)
AST: 19 U/L (ref 15–41)
Alkaline Phosphatase: 68 U/L (ref 38–126)
Anion gap: 8 (ref 5–15)
BUN: 15 mg/dL (ref 6–20)
CHLORIDE: 101 mmol/L (ref 101–111)
CO2: 30 mmol/L (ref 22–32)
CREATININE: 0.85 mg/dL (ref 0.44–1.00)
Calcium: 9.4 mg/dL (ref 8.9–10.3)
GFR calc Af Amer: >60 mL/min (ref >60)
GFR calc non Af Amer: >60 mL/min (ref >60)
GLUCOSE: 128 mg/dL — AB (ref 65–99)
POTASSIUM: 3.8 mmol/L (ref 3.5–5.1)
Sodium: 139 mmol/L (ref 135–145)
Total Bilirubin: 0.6 mg/dL (ref 0.3–1.2)
Total Protein: 7.1 g/dL (ref 6.5–8.1)

## 2016-06-20 LAB — URINALYSIS, ROUTINE W REFLEX MICROSCOPIC
Bilirubin Urine: NEGATIVE
Glucose, UA: NEGATIVE mg/dL
Hgb urine dipstick: NEGATIVE
Ketones, ur: NEGATIVE mg/dL
Nitrite: NEGATIVE
Protein, ur: NEGATIVE mg/dL
Specific Gravity, Urine: 1.013 (ref 1.005–1.030)
pH: 6 (ref 5.0–8.0)

## 2016-06-20 LAB — ETHANOL

## 2016-06-20 LAB — VALPROIC ACID LEVEL: VALPROIC ACID LVL: 67 ug/mL (ref 50.0–100.0)

## 2016-06-20 MED ORDER — ONDANSETRON HCL 4 MG PO TABS: 4.0000 mg | ORAL_TABLET | Freq: Three times a day (TID) | ORAL | Status: DC | PRN

## 2016-06-20 MED ORDER — FLUTICASONE PROPIONATE 50 MCG/ACT NA SUSP
1.0000 | Freq: Two times a day (BID) | NASAL | Status: DC | PRN
Start: 2016-06-20 — End: 2016-06-20

## 2016-06-20 MED ORDER — LISINOPRIL-HYDROCHLOROTHIAZIDE 20-25 MG PO TABS: 1.0000 | ORAL_TABLET | Freq: Every day | ORAL | Status: DC

## 2016-06-20 MED ORDER — ACETAMINOPHEN 325 MG PO TABS: 650.0000 mg | ORAL_TABLET | ORAL | Status: DC | PRN

## 2016-06-20 MED ORDER — ALBUTEROL SULFATE HFA 108 (90 BASE) MCG/ACT IN AERS: 2.0000 | INHALATION_SPRAY | RESPIRATORY_TRACT | Status: DC | PRN

## 2016-06-20 MED ORDER — DIVALPROEX SODIUM 500 MG PO DR TAB: 1000.0000 mg | DELAYED_RELEASE_TABLET | Freq: Every day | ORAL | Status: DC

## 2016-06-20 MED ORDER — FLUTICASONE FUROATE-VILANTEROL 100-25 MCG/INH IN AEPB
1.0000 | INHALATION_SPRAY | Freq: Every day | RESPIRATORY_TRACT | Status: DC
  Filled 2016-06-20: qty 28

## 2016-06-20 MED ORDER — ADULT MULTIVITAMIN W/MINERALS CH
1.0000 | ORAL_TABLET | Freq: Every day | ORAL | Status: DC
  Administered 2016-06-20: 1 via ORAL
  Filled 2016-06-20: qty 1

## 2016-06-20 MED ORDER — PANTOPRAZOLE SODIUM 40 MG PO TBEC
40.0000 mg | DELAYED_RELEASE_TABLET | Freq: Every day | ORAL | Status: DC
  Administered 2016-06-20: 40 mg via ORAL
  Filled 2016-06-20: qty 1

## 2016-06-20 MED ORDER — HYDROCHLOROTHIAZIDE 25 MG PO TABS: 25.0000 mg | ORAL_TABLET | Freq: Every day | ORAL | Status: DC

## 2016-06-20 MED ORDER — LISINOPRIL 20 MG PO TABS: 20.0000 mg | ORAL_TABLET | Freq: Every day | ORAL | Status: DC

## 2016-06-20 MED ORDER — LORATADINE 10 MG PO TABS: 10.0000 mg | ORAL_TABLET | Freq: Every day | ORAL | Status: DC

## 2016-06-20 MED ORDER — CLONIDINE HCL 0.1 MG PO TABS: 0.1000 mg | ORAL_TABLET | Freq: Every day | ORAL | Status: DC

## 2016-06-20 MED ORDER — RISPERIDONE 2 MG PO TABS: 2.0000 mg | ORAL_TABLET | Freq: Every day | ORAL | Status: DC

## 2016-06-20 MED ORDER — DICLOFENAC SODIUM 1 % TD GEL: 2.0000 g | Freq: Two times a day (BID) | TRANSDERMAL | Status: DC | PRN

## 2016-06-20 MED ORDER — ATORVASTATIN CALCIUM 20 MG PO TABS: 20.0000 mg | ORAL_TABLET | Freq: Every day | ORAL | Status: DC

## 2016-06-20 MED ORDER — ALUM & MAG HYDROXIDE-SIMETH 200-200-20 MG/5ML PO SUSP: 30.0000 mL | ORAL | Status: DC | PRN

## 2016-06-20 MED ORDER — ALBUTEROL SULFATE (2.5 MG/3ML) 0.083% IN NEBU
2.5000 mg | INHALATION_SOLUTION | RESPIRATORY_TRACT | Status: DC | PRN
Start: 2016-06-20 — End: 2016-06-20

## 2016-06-20 MED ORDER — DULOXETINE HCL 30 MG PO CPEP
60.0000 mg | ORAL_CAPSULE | Freq: Every day | ORAL | Status: DC
  Filled 2016-06-20: qty 2

## 2016-06-20 NOTE — BH Assessment (Signed)
[facility] Assessment Progress Note  Per Waylan Boga, DNP, this pt requires psychiatric hospitalization at this time.  The following facilities have been contacted to seek placement for this pt, with results as noted:  Beds available, information sent, decision pending:  [facility]   At capacity:  [place], [place] Triage Specialist [phone]

## 2016-06-20 NOTE — ED Notes (Signed)
Called [facility] for transport. 

## 2016-06-20 NOTE — ED Notes (Signed)
Dr. Eulis Foster notified.

## 2016-06-20 NOTE — ED Notes (Signed)
Walked with [facility] Kandra Nicolas to pt's car per her request and obtained one black bag of belongings out of the front seat of her car to go with her for transport.

## 2016-06-20 NOTE — ED Triage Notes (Signed)
Patient from home who sees Dr. Jacqulyn Ducking and has an appointment with him next month, presents to ED with auditory and visual hallucinations.  Patient denies suicidal or homicidal ideation.  She tried to get in to see her doctor sooner but could not.  She reports being complient with her medication.

## 2016-06-20 NOTE — ED Notes (Signed)
Pt requested for her belongings (clothing) to be gotten out of her car to take with her. AC and Charge RN went to vehicle to obtain.  Security went through belongings to make screen for safety.  Patient present during this.  Patient left with [facility].

## 2016-06-20 NOTE — ED Provider Notes (Signed)
[facility] DEPT Provider Note   CSN: [id number] Arrival date & time: 06/20/16  1225  By signing my name below, I, Allison Sharp, attest that this documentation has been prepared under the direction and in the presence of Virgel Manifold, MD. Electronically Signed: Sonum Sharp, Education administrator. 06/20/16. 1:12 PM.  History   Chief Complaint Chief Complaint  Patient presents with  . Hallucinations    The history is provided by the patient. No language interpreter was used.     HPI Comments: Allison Sharp is a 61 y.o. female who presents to the Emergency Department complaining of auditory and visual hallucinations for the last few days. She reports feeling nervous that she cannot control her mind and ease her thoughts. She reports seeing colored dots and flashes on the ceiling when attempting to sleep but understands that they are not actually there. She also hears chatter in her mind but cannot make out specific details. She reports a HA due to having this chatter in her mind that she cannot ease; states she has not had a HA with prior episodes. She states the auditory hallucinations happen every few years which typically requires behavioral health admission and changes in medications to alleviate her symptoms. She reports feeling suicidal yesterday because she was frustrated with herself that she couldn't stop the chatter in her mind; she denies feeling suicidal today. She takes medications for her symptoms which she is complaint with; she denies recent changes. She denies new stresses aside from having a new puppy. She denies vision problems.   Past Medical History:  Diagnosis Date  . Allergy   . Anxiety   . Arthritis   . Asthma   . Bipolar 1 disorder ([place])   . CAD (coronary artery disease)   . Cellulitis   . CHF (congestive heart failure) (HCC)    diastolic dysfunction  . Chronic back pain   . Chronic headaches   . Complication of anesthesia    States she typically gets sick s/p anesthesia   . Contusion of sacrum   . COPD (chronic obstructive pulmonary disease) ([place])   . Depression   . Dyspnea    PFT 03/05/09 FEV1 2.77(98%), FVC 3.25(86%), FEV1% 85, TLC 5.88(99%), DLCO 60% ,  Methacholine challenge 03/16/09 normal ,  CT chest 03/12/09 no pulmonary disease  . Fungal infection   . GERD (gastroesophageal reflux disease)   . History of colonoscopy 10/17/2002   by Dr Rehman-> distal non-specific proctitis, small ext hemorrhoids,   . HTN (hypertension)   . Hyperlipidemia   . Hyperthyroidism   . Hypothyroidism    States she only has hyperthyroidism  . Migraine headache   . Morbid obesity with body mass index of 50.0-59.9 in adult [facility]) JAN 2011 370 LBS   2004 311 BMI 45.9  . Myocardial infarction    NOV 1997  . OSA on CPAP    she had been on 2L O2 at night but that was stopped  . PONV (postoperative nausea and vomiting)   . PTSD (post-traumatic stress disorder)   . Suicidal ideation   . Urine incontinence   . Vitamin D deficiency     Patient Active Problem List   Diagnosis Date Noted  . Severe obesity (BMI >= 40) ([place]) 07/05/2015  . Hyperlipidemia LDL goal <130 07/05/2015  . MDD (major depressive disorder), single episode, severe with psychotic features ([place]) 01/21/2015  . 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 Acute cystitis with hematuria 04/01/2014  . Bilateral knee pain 01/07/2014  . Scar  condition and fibrosis of skin 12/11/2013  . Essential hypertension, benign 07/01/2013  . Acute respiratory failure ([place]) 11/05/2012  . Asthma with acute exacerbation 11/04/2012  . OSA on CPAP 11/04/2012  . Back pain, chronic 08/27/2012  . Vitamin D insufficiency 04/04/2012  . Insomnia due to mental disorder 04/04/2012  . Anxiety 04/04/2012  . OCD (obsessive compulsive disorder) 04/04/2012  . PTSD (post-traumatic stress disorder) 06/28/2011  . Coronary artery disease 08/26/2010  . FATTY LIVER DISEASE 03/29/2009  . GERD 03/01/2009  . Morbid obesity ([place]) 02/15/2009  .  HYPOTHYROIDISM 02/12/2009    Past Surgical History:  Procedure Laterality Date  . ABDOMINAL HYSTERECTOMY     sept 1996  . APPENDECTOMY    . BACK SURGERY  2008  . CARDIAC CATHETERIZATION     nov 1997  . CHOLECYSTECTOMY    . COLONOSCOPY  10/17/2002    Distal proctitis, small external hemorrhoids, otherwise/  normal colonoscopy. Suspect rectal bleeding secondary to hemorrhoids  . ESOPHAGOGASTRODUODENOSCOPY  03/18/09   fundic gland polyps/mild gastritis  . HERNIA REPAIR  1978  . JOINT REPLACEMENT     bil knee replacement  . KNEE ARTHROSCOPY    . MULTIPLE EXTRACTIONS WITH ALVEOLOPLASTY N/A 08/16/2015   Procedure: EXTRACTION OF TEETH THREE, SIX, EIGHT, NINE, ELEVEN, FOURTEEN, FIFTEEN, TWENTY-EIGHT WITH ALVEOLOPLASTY;  Surgeon: Diona Browner, DDS;  Location: [facility];  Service: Oral Surgery;  Laterality: N/A;  . TONSILLECTOMY    . TOTAL VAGINAL HYSTERECTOMY    . TUBAL LIGATION      OB History    No data available       Home Medications    Prior to Admission medications   Medication Sig Start Date End Date Taking? Authorizing Provider  albuterol (PROVENTIL HFA;VENTOLIN HFA) 108 (90 Base) MCG/ACT inhaler Inhale 2 puffs into the lungs every 4 (four) hours as needed for wheezing or shortness of breath. 01/27/16   Fransisca Kaufmann Dettinger, MD  albuterol (PROVENTIL) (2.5 MG/3ML) 0.083% nebulizer solution Take 3 mLs (2.5 mg total) by nebulization every 4 (four) hours as needed for wheezing or shortness of breath. 06/02/16   Fransisca Kaufmann Dettinger, MD  atorvastatin (LIPITOR) 20 MG tablet Take 1 tablet (20 mg total) by mouth daily. 07/05/15   Fransisca Kaufmann Dettinger, MD  BREO ELLIPTA 100-25 MCG/INH AEPB INHALE 1 PUFF INTO THE LUNGS DAILY 01/24/16   Fransisca Kaufmann Dettinger, MD  cetirizine (ZYRTEC) 10 MG tablet TAKE 1 TABLET ONCE DAILY 02/15/16   Fransisca Kaufmann Dettinger, MD  cloNIDine (CATAPRES) 0.1 MG tablet TAKE ONE TABLET AT BEDTIME 05/11/16   Fransisca Kaufmann Dettinger, MD  Clotrimazole 1 % OINT Apply 1 application topically 2  (two) times daily as needed (SKIN RASHES).    Historical Provider, MD  diclofenac sodium (VOLTAREN) 1 % GEL Apply to affected area twice daily as needed 03/15/16   Fransisca Kaufmann Dettinger, MD  divalproex (DEPAKOTE) 500 MG DR tablet Take 2 tablets (1,000 mg total) by mouth at bedtime. 03/30/16   Kathlee Nations, MD  doxepin (SINEQUAN) 25 MG capsule TAKE (1) CAPSULE DAILY 03/30/16   Kathlee Nations, MD  DULoxetine (CYMBALTA) 60 MG capsule TAKE (1) CAPSULE DAILY 06/19/16   Kathlee Nations, MD  fluticasone (FLONASE) 50 MCG/ACT nasal spray Place 1 spray into both nostrils 2 (two) times daily as needed for allergies or rhinitis. 04/23/15   Fransisca Kaufmann Dettinger, MD  levothyroxine (SYNTHROID, LEVOTHROID) 112 MCG tablet TAKE (1) TABLET DAILY BE- [place] THYROID 01/21/16   Worthy Rancher, MD  lisinopril-hydrochlorothiazide (PRINZIDE,ZESTORETIC) 20-25 MG tablet Take 1 tablet by mouth daily. 03/23/16   Fransisca Kaufmann Dettinger, MD  meloxicam (MOBIC) 15 MG tablet Take 1 tablet (15 mg total) by mouth daily. 01/18/16   Fransisca Kaufmann Dettinger, MD  Multiple Vitamin (MULTIVITAMIN WITH MINERALS) TABS Take 1 tablet by mouth daily. For nutritional supplementation. 10/31/12   Niel Hummer, NP  omeprazole (PRILOSEC) 20 MG capsule TAKE (1) CAPSULE DAILY 04/03/16   Fransisca Kaufmann Dettinger, MD  risperiDONE (RISPERDAL) 2 MG tablet Take 1 tablet (2 mg total) by mouth at bedtime. 03/30/16   Kathlee Nations, MD    Family History Family History  Problem Relation Age of Onset  . Hypertension Mother   . Bipolar disorder Mother   . Dementia Mother   . Depression Mother   . Heart attack Mother   . AAA (abdominal aortic aneurysm) Mother   . Coronary artery disease Father   . Alcohol abuse Father   . Hypertension Brother   . Coronary artery disease Brother   . Bipolar disorder Brother   . Depression Brother   . Depression Sister   . Paranoid behavior Sister   . Cancer Sister     breast  . Bipolar disorder Sister   . Depression Sister   .  Hypertension Sister   . Cancer Son     thyroid  . Cancer Maternal Aunt     breast metastatized to brain  . Anesthesia problems Neg Hx   . Hypotension Neg Hx   . Malignant hyperthermia Neg Hx   . Pseudochol deficiency Neg Hx     Social History Social History  Substance Use Topics  . Smoking status: Never Smoker  . Smokeless tobacco: Never Used  . Alcohol use No     Allergies   Ativan [lorazepam]; Haldol [haloperidol]; and Naproxen   Review of Systems Review of Systems  A complete 10 system review of systems was obtained and all systems are negative except as noted in the HPI and PMH.    Physical Exam Updated Vital Signs BP 127/89 (BP Location: Left Arm)   Pulse 119   Temp 98.3 F (36.8 C)   Ht 5\' 9"  (1.753 m)   Wt (!) 387 lb (175.5 kg)   LMP 02/18/1995   SpO2 98%   BMI 57.15 kg/m   Physical Exam  Constitutional: She is oriented to person, place, and time. She appears well-developed and well-nourished. No distress.  HENT:  Head: Normocephalic and atraumatic.  Eyes: EOM are normal.  Neck: Normal range of motion.  Cardiovascular: Normal rate, regular rhythm and normal heart sounds.   Pulmonary/Chest: Effort normal and breath sounds normal.  Abdominal: Soft. She exhibits no distension. There is no tenderness.  Musculoskeletal: Normal range of motion.  Neurological: She is alert and oriented to person, place, and time.  Skin: Skin is warm and dry.  Psychiatric: She has a normal mood and affect. Her speech is normal and behavior is normal. Judgment and thought content normal.  Calm, cooperative. Speech is clear. Thought content is appropriate. Good insight.   Nursing note and vitals reviewed.    ED Treatments / Results  DIAGNOSTIC STUDIES: Oxygen Saturation is 98% on RA, normal by my interpretation.    COORDINATION OF CARE: 12:59 PM Discussed treatment plan with pt at bedside and pt agreed to plan.   Labs (all labs ordered are listed, but only abnormal  results are displayed) Labs Reviewed  COMPREHENSIVE METABOLIC PANEL - Abnormal; Notable for the following:  Result Value   Glucose, Bld 128 (*)    All other components within normal limits  CBC WITH DIFFERENTIAL/PLATELET  ETHANOL  RAPID URINE DRUG SCREEN, HOSP PERFORMED  VALPROIC ACID LEVEL  URINALYSIS, ROUTINE W REFLEX MICROSCOPIC    EKG  EKG Interpretation None       Radiology No results found.  Procedures Procedures (including critical care time)  Medications Ordered in ED Medications - No data to display   Initial Impression / Assessment and Plan / ED Course  I have reviewed the triage vital signs and the nursing notes.  Pertinent labs & imaging results that were available during my care of the patient were reviewed by me and considered in my medical decision making (see chart for details).     61 year old female with auditory hallucinations. She is not suicidal or homicidal. Medically clear for psychiatric evaluation.  Final Clinical Impressions(s) / ED Diagnoses   Final diagnoses:  Auditory hallucination    New Prescriptions New Prescriptions   No medications on file   I personally preformed the services scribed in my presence. The recorded information has been reviewed is accurate. Virgel Manifold, MD.    Virgel Manifold, MD 06/20/16 [phone]

## 2016-06-20 NOTE — BH Assessment (Signed)
Patient accepted, voluntarily, to [facility], [place] Unit room 147-2. Accepting NP Ngozi Nnaji. For nursing report please call [phone]. Patient should arrive by 2300. Spoke with Clyda Greener at [facility].

## 2016-06-20 NOTE — BH Assessment (Addendum)
Assessment Note  Allison Sharp is an 61 y.o. female. Patient presents to [facility] voluntarily. She drove herself to [facility] for a [facility] assessment. Patient stating that she is experiencing auditory and visual hallucinations. She has auditory hallucinations of "People chattering in a conversation". No command type hallucinations. Patient sts, "The conversations are getting louder and louder". She is also experiencing visual hallucinations of "lights, spots, and flickering flashes". Patient has a history of auditory and visual hallucinations and has been hospitalized for this in the past. Patient reports suicidal ideations earlier today. During the TTS assessment patient sts, "I feel better now that I am here"....."I don't want to hurt myself"..."I want to get better". Patient reports a history of prior suicide attempts/gestures, 4 prior suicide attempts, and all overdoses. Triggers for prior suicide attempts/gestures were all due to "not handling life stressors very well". No specific stressors identified today. No HI. However she has felt homicidal toward her niece years ago. No history of prior homicidal attempts/gestures. No legal issues. No alcohol or drug use. Patient hospitalized at [facility] in the past. Patient's current psychiatrist is Dr. Adele Schilder at the [facility] outpatient clinic.   Diagnosis: Bipolar I Disorder with Depressed Mood/Psychotic Features and Anxiety Disorder  Past Medical History:  Past Medical History:  Diagnosis Date  . Allergy   . Anxiety   . Arthritis   . Asthma   . Bipolar 1 disorder ([place])   . CAD (coronary artery disease)   . Cellulitis   . CHF (congestive heart failure) (HCC)    diastolic dysfunction  . Chronic back pain   . Chronic headaches   . Complication of anesthesia    States she typically gets sick s/p anesthesia  . Contusion of sacrum   . COPD (chronic obstructive pulmonary disease) ([place])   . Depression   . Dyspnea    PFT 03/05/09 FEV1 2.77(98%), FVC 3.25(86%), FEV1% 85,  TLC 5.88(99%), DLCO 60% ,  Methacholine challenge 03/16/09 normal ,  CT chest 03/12/09 no pulmonary disease  . Fungal infection   . GERD (gastroesophageal reflux disease)   . History of colonoscopy 10/17/2002   by Dr Rehman-> distal non-specific proctitis, small ext hemorrhoids,   . HTN (hypertension)   . Hyperlipidemia   . Hyperthyroidism   . Hypothyroidism    States she only has hyperthyroidism  . Migraine headache   . Morbid obesity with body mass index of 50.0-59.9 in adult [facility]) JAN 2011 370 LBS   2004 311 BMI 45.9  . Myocardial infarction    NOV 1997  . OSA on CPAP    she had been on 2L O2 at night but that was stopped  . PONV (postoperative nausea and vomiting)   . PTSD (post-traumatic stress disorder)   . Suicidal ideation   . Urine incontinence   . Vitamin D deficiency     Past Surgical History:  Procedure Laterality Date  . ABDOMINAL HYSTERECTOMY     sept 1996  . APPENDECTOMY    . BACK SURGERY  2008  . CARDIAC CATHETERIZATION     nov 1997  . CHOLECYSTECTOMY    . COLONOSCOPY  10/17/2002    Distal proctitis, small external hemorrhoids, otherwise/  normal colonoscopy. Suspect rectal bleeding secondary to hemorrhoids  . ESOPHAGOGASTRODUODENOSCOPY  03/18/09   fundic gland polyps/mild gastritis  . HERNIA REPAIR  1978  . JOINT REPLACEMENT     bil knee replacement  . KNEE ARTHROSCOPY    . MULTIPLE EXTRACTIONS WITH ALVEOLOPLASTY N/A 08/16/2015   Procedure:  EXTRACTION OF TEETH THREE, SIX, EIGHT, NINE, ELEVEN, FOURTEEN, FIFTEEN, TWENTY-EIGHT WITH ALVEOLOPLASTY;  Surgeon: Diona Browner, DDS;  Location: [facility];  Service: Oral Surgery;  Laterality: N/A;  . TONSILLECTOMY    . TOTAL VAGINAL HYSTERECTOMY    . TUBAL LIGATION      Family History:  Family History  Problem Relation Age of Onset  . Hypertension Mother   . Bipolar disorder Mother   . Dementia Mother   . Depression Mother   . Heart attack Mother   . AAA (abdominal aortic aneurysm) Mother   . Coronary artery  disease Father   . Alcohol abuse Father   . Hypertension Brother   . Coronary artery disease Brother   . Bipolar disorder Brother   . Depression Brother   . Depression Sister   . Paranoid behavior Sister   . Cancer Sister     breast  . Bipolar disorder Sister   . Depression Sister   . Hypertension Sister   . Cancer Son     thyroid  . Cancer Maternal Aunt     breast metastatized to brain  . Anesthesia problems Neg Hx   . Hypotension Neg Hx   . Malignant hyperthermia Neg Hx   . Pseudochol deficiency Neg Hx     Home Medications:  (Not in a hospital admission)  OB/GYN Status:  Patient's last menstrual period was 02/18/1995.  General Assessment Data Location of Assessment: [facility] TTS Assessment: In system Is this a Tele or Face-to-Face Assessment?: Face-to-Face Is this an Initial Assessment or a Re-assessment for this encounter?: Initial Assessment Marital status: Separated (seperated for 4 yrs) [name] name:  ([name]) Is patient pregnant?: No Pregnancy Status: No Living Arrangements: Other (Comment), Other relatives ("I live with oldest sister for the past 4 or 5 yrs") Can pt return to current living arrangement?: Yes Admission Status: Voluntary Is patient capable of signing voluntary admission?: Yes Referral Source: Self/Family/Friend Insurance type:  Forensic scientist)     Crisis Care Plan Living Arrangements: Other (Comment), Other relatives ("I live with oldest sister for the past 4 or 5 yrs") Legal Guardian: Other: (no legal guardian ) Name of Psychiatrist:  (Dr. Berniece Andreas ) Name of Therapist:  (No therapist )  Education Status Is patient currently in school?: No Current Grade:  (n/a) Highest grade of school patient has completed:  ("I finished a year of college") Name of school:  (n/a) Contact person:  (n/a)  Risk to self with the past 6 months Suicidal Ideation: Yes-Currently Present Has patient been a risk to self within the past 6 months prior to  admission? : Yes Suicidal Intent: No Has patient had any suicidal intent within the past 6 months prior to admission? : No Is patient at risk for suicide?: No Suicidal Plan?: No Has patient had any suicidal plan within the past 6 months prior to admission? : No Access to Means: No What has been your use of drugs/alcohol within the last 12 months?:  (denies) Previous Attempts/Gestures: Yes How many times?:  (4x's-Overdoses ) Other Self Harm Risks:  (denies ) Triggers for Past Attempts: Other (Comment) (depression) Intentional Self Injurious Behavior: None Family Suicide History: No Recent stressful life event(s): Other (Comment) ("Everything makes me stressed.Marland KitchenMarland KitchenI don't cope well") Persecutory voices/beliefs?: No Depression: Yes Depression Symptoms: Feeling angry/irritable, Loss of interest in usual pleasures, Fatigue, Isolating, Tearfulness Substance abuse history and/or treatment for substance abuse?: No Suicide prevention information given to non-admitted patients: Not applicable  Risk to Others within  the past 6 months Homicidal Ideation: No Does patient have any lifetime risk of violence toward others beyond the six months prior to admission? : No Thoughts of Harm to Others: No Current Homicidal Intent: No Current Homicidal Plan: No Access to Homicidal Means: No Identified Victim:  (n/a) History of harm to others?: No Assessment of Violence: None Noted Violent Behavior Description:  (currently calm and cooperative) Does patient have access to weapons?: No Criminal Charges Pending?: No Does patient have a court date: No Is patient on probation?: No  Psychosis Hallucinations: Auditory, Visual (Auditory-x3 days.Marland Kitchen"Conversations amongst multiple people") Delusions: Unspecified (Visual- "Polka dots with flash of colors")  Mental Status Report Appearance/Hygiene: Disheveled Eye Contact: Good Motor Activity: Freedom of movement Speech: Logical/coherent Level of Consciousness:  Alert Mood: Depressed Affect: Appropriate to circumstance, Depressed Anxiety Level: Panic Attacks Panic attack frequency:  (1x per month) Most recent panic attack:  ("last night") Thought Processes: Relevant, Coherent Judgement: Impaired Orientation: Person, Place, Time, Situation Obsessive Compulsive Thoughts/Behaviors: None  Cognitive Functioning Concentration: Decreased Memory: Recent Intact, Remote Intact IQ: Average Insight: Good Impulse Control: Good Appetite: Good Weight Loss:  (n/a) Weight Gain:  (25 pounds in the past 6 months) Sleep: Decreased Total Hours of Sleep:  (4 hrs per night ) Vegetative Symptoms: None  ADLScreening [facility] Assessment Services) Patient's cognitive ability adequate to safely complete daily activities?: Yes Patient able to express need for assistance with ADLs?: Yes Independently performs ADLs?: Yes (appropriate for developmental age)  Prior Inpatient Therapy Prior Inpatient Therapy: Yes Prior Therapy Dates:  [facility]) Prior Therapy Facilty/Provider(s):  ([id number] (3x's), 2014, and 2016) Reason for Treatment:  (depression, overdoses, homicidal thoughts toward niece)  Prior Outpatient Therapy Prior Outpatient Therapy: Yes Prior Therapy Dates:  (current) Prior Therapy Facilty/Provider(s):  [facility] outpatient-Dr. Berniece Andreas) Reason for Treatment:  (medication management ) Does patient have an ACCT team?: No Does patient have Intensive In-House Services?  : No Does patient have [facility] services? : No Does patient have P4CC services?: No  ADL Screening (condition at time of admission) Patient's cognitive ability adequate to safely complete daily activities?: Yes Is the patient deaf or have difficulty hearing?: No Does the patient have difficulty seeing, even when wearing glasses/contacts?: No Does the patient have difficulty concentrating, remembering, or making decisions?: No Patient able to express need for assistance with ADLs?: Yes Does the  patient have difficulty dressing or bathing?: No Independently performs ADLs?: Yes (appropriate for developmental age) Weakness of Legs: Both ("I have weekness in both knees.Marland KitchenMarland KitchenI use a can to walk") Weakness of Arms/Hands: Both  Home Assistive Devices/Equipment Home Assistive Devices/Equipment: Cane (specify quad or straight)    Abuse/Neglect Assessment (Assessment to be complete while patient is alone) Physical Abuse: Denies Verbal Abuse: Denies Sexual Abuse: Denies Exploitation of patient/patient's resources: Denies Self-Neglect: Denies Values / Beliefs Cultural Requests During Hospitalization: None Spiritual Requests During Hospitalization: None   Advance Directives (For Healthcare) Does Patient Have a Medical Advance Directive?: No Would patient like information on creating a medical advance directive?: No - Patient declined    Additional Information 1:1 In Past 12 Months?: No CIRT Risk: No Elopement Risk: No Does patient have medical clearance?: Yes     Disposition: Waylan Boga, DNP, recommends [place] placement. TTS to seek placement.   Disposition Initial Assessment Completed for this Encounter: Yes  On Site Evaluation by:   Reviewed with Physician:   06/20/2016 2:25 PM Waldon Merl, MS, LCAS-A

## 2016-06-20 NOTE — ED Notes (Signed)
Attempted to call report to [facility].  Nurse not available at this time.  Told to call back in 10 minutes.

## 2016-06-20 NOTE — ED Notes (Signed)
Report called to [facility] called for transport.

## 2016-06-20 NOTE — ED Notes (Signed)
Patient walked up to nurses station and told nurse that she did not want to go to [facility].  Patient reported that she was afraid to go so far from home and that she had only been to [facility] before.

## 2016-06-21 DIAGNOSIS — F315 Bipolar disorder, current episode depressed, severe, with psychotic features: Secondary | ICD-10-CM | POA: Insufficient documentation

## 2016-06-21 DIAGNOSIS — I10 Essential (primary) hypertension: Secondary | ICD-10-CM | POA: Insufficient documentation

## 2016-06-21 DIAGNOSIS — I252 Old myocardial infarction: Secondary | ICD-10-CM | POA: Insufficient documentation

## 2016-06-22 ENCOUNTER — Telehealth (HOSPITAL_COMMUNITY): Payer: Self-pay

## 2016-06-22 NOTE — Telephone Encounter (Signed)
Samantha a PA-C at [facility] called regarding patient, she need current and past medications. Patient was admitted 2 days ago with hallucinations and suicidal ideations. They have increased her Depakote to 1250 mg and will do a Depakote level tomorrow. They d/c'd the Doxepin, continued the Cymbalta and decreased Risperidone to .5 mg q am and 2 mg po qhs. Patient has had a recent death of a friend and has been caring for a nephew. Patients social worker is Clementeen Hoof [phone] - phone number for the unit is [phone]

## 2016-06-22 NOTE — Telephone Encounter (Signed)
Needed discharge summary before schedule her appointment.

## 2016-06-23 ENCOUNTER — Other Ambulatory Visit: Payer: Self-pay | Admitting: Family Medicine

## 2016-06-23 DIAGNOSIS — E785 Hyperlipidemia, unspecified: Secondary | ICD-10-CM

## 2016-06-23 DIAGNOSIS — N390 Urinary tract infection, site not specified: Secondary | ICD-10-CM | POA: Diagnosis not present

## 2016-06-23 DIAGNOSIS — J449 Chronic obstructive pulmonary disease, unspecified: Secondary | ICD-10-CM | POA: Diagnosis not present

## 2016-06-23 DIAGNOSIS — E039 Hypothyroidism, unspecified: Secondary | ICD-10-CM | POA: Diagnosis not present

## 2016-06-23 DIAGNOSIS — Z1612 Extended spectrum beta lactamase (ESBL) resistance: Secondary | ICD-10-CM | POA: Diagnosis not present

## 2016-06-23 DIAGNOSIS — K219 Gastro-esophageal reflux disease without esophagitis: Secondary | ICD-10-CM | POA: Diagnosis not present

## 2016-06-23 DIAGNOSIS — A498 Other bacterial infections of unspecified site: Secondary | ICD-10-CM | POA: Diagnosis not present

## 2016-06-24 DIAGNOSIS — N200 Calculus of kidney: Secondary | ICD-10-CM | POA: Diagnosis not present

## 2016-06-24 DIAGNOSIS — Z1612 Extended spectrum beta lactamase (ESBL) resistance: Secondary | ICD-10-CM | POA: Diagnosis not present

## 2016-06-24 DIAGNOSIS — J449 Chronic obstructive pulmonary disease, unspecified: Secondary | ICD-10-CM | POA: Diagnosis not present

## 2016-06-24 DIAGNOSIS — A498 Other bacterial infections of unspecified site: Secondary | ICD-10-CM | POA: Diagnosis not present

## 2016-06-24 DIAGNOSIS — K760 Fatty (change of) liver, not elsewhere classified: Secondary | ICD-10-CM | POA: Diagnosis not present

## 2016-06-24 DIAGNOSIS — N390 Urinary tract infection, site not specified: Secondary | ICD-10-CM | POA: Diagnosis not present

## 2016-06-29 ENCOUNTER — Ambulatory Visit (HOSPITAL_COMMUNITY): Payer: Self-pay | Admitting: Psychiatry

## 2016-07-06 ENCOUNTER — Other Ambulatory Visit (HOSPITAL_COMMUNITY): Payer: Self-pay | Admitting: Psychiatry

## 2016-07-06 ENCOUNTER — Encounter: Payer: Self-pay | Admitting: Family Medicine

## 2016-07-06 ENCOUNTER — Ambulatory Visit (HOSPITAL_COMMUNITY): Payer: Self-pay | Admitting: Psychiatry

## 2016-07-06 ENCOUNTER — Ambulatory Visit (INDEPENDENT_AMBULATORY_CARE_PROVIDER_SITE_OTHER): Payer: Medicare Other | Admitting: Family Medicine

## 2016-07-06 ENCOUNTER — Other Ambulatory Visit: Payer: Self-pay | Admitting: Family Medicine

## 2016-07-06 VITALS — BP 125/82 | HR 114 | Temp 97.7°F | Ht 69.0 in | Wt 390.0 lb

## 2016-07-06 DIAGNOSIS — Z1624 Resistance to multiple antibiotics: Secondary | ICD-10-CM

## 2016-07-06 DIAGNOSIS — F3160 Bipolar disorder, current episode mixed, unspecified: Secondary | ICD-10-CM

## 2016-07-06 DIAGNOSIS — R440 Auditory hallucinations: Secondary | ICD-10-CM | POA: Diagnosis not present

## 2016-07-06 DIAGNOSIS — E785 Hyperlipidemia, unspecified: Secondary | ICD-10-CM

## 2016-07-06 NOTE — Progress Notes (Signed)
BP 125/82   Pulse (!) 114   Temp 97.7 F (36.5 C) (Oral)   Ht 5\' 9"  (1.753 m)   Wt (!) 390 lb (176.9 kg)   LMP 02/18/1995   BMI 57.59 kg/m    Subjective:    Patient ID: Allison Sharp, female    DOB: 1955-09-20, 61 y.o.   MRN: [id number]  HPI: Allison Sharp is a 61 y.o. female presenting on 07/06/2016 for Hospitalization Follow-up [facility]/23 - 1/29 mental [facility])   HPI Hospital follow-up for UTI and auditory hallucinations Patient is coming in for hospital follow-up for 2 different issues. The first was that she was having some auditory hallucinations and was admitted into the psychiatric inpatient behavioral health and while she was there is a work consult in with her psychiatrist and helping to adjust medications to help control this. She denies any suicidal ideations and denies having any suicidal ideations prior to going in. She has not had any auditory hallucinations since leaving the hospital. During the hospital visit she was also diagnosed with UTI and she was told that she had multiply drug-resistant UTI and needed a referral for infectious disease. The culture shows that she is resistant with an Escherichia coli bacteria that was resistant to everything except ertapenem. She says she has having symptoms of dysuria and super pubic abdominal pressure. She denies any flank pain or fevers or chills.  Relevant past medical, surgical, family and social history reviewed and updated as indicated. Interim medical history since our last visit reviewed. Allergies and medications reviewed and updated.  Review of Systems  Constitutional: Negative for chills and fever.  HENT: Negative for congestion, ear discharge and ear pain.   Eyes: Negative for redness and visual disturbance.  Respiratory: Negative for chest tightness and shortness of breath.   Cardiovascular: Negative for chest pain and leg swelling.  Gastrointestinal: Positive for abdominal pain (Abdominal pressure).  Negative for constipation, diarrhea, nausea and vomiting.  Genitourinary: Positive for dysuria and urgency. Negative for decreased urine volume, difficulty urinating, vaginal bleeding, vaginal discharge and vaginal pain.  Musculoskeletal: Negative for back pain and gait problem.  Skin: Negative for rash.  Neurological: Negative for light-headedness and headaches.  Psychiatric/Behavioral: Positive for dysphoric mood and hallucinations. Negative for agitation, behavioral problems, self-injury, sleep disturbance and suicidal ideas.  All other systems reviewed and are negative.  Per HPI unless specifically indicated above     Objective:    BP 125/82   Pulse (!) 114   Temp 97.7 F (36.5 C) (Oral)   Ht 5\' 9"  (1.753 m)   Wt (!) 390 lb (176.9 kg)   LMP 02/18/1995   BMI 57.59 kg/m   Wt Readings from Last 3 Encounters:  07/06/16 (!) 390 lb (176.9 kg)  06/20/16 (!) 387 lb (175.5 kg)  03/15/16 (!) 379 lb 8 oz (172.1 kg)    Physical Exam  Constitutional: She is oriented to person, place, and time. She appears well-developed and well-nourished. No distress.  Eyes: Conjunctivae are normal.  Cardiovascular: Normal rate, regular rhythm, normal heart sounds and intact distal pulses.   No murmur heard. Pulmonary/Chest: Effort normal and breath sounds normal. No respiratory distress. She has no wheezes.  Abdominal: Soft. Bowel sounds are normal. She exhibits no distension. There is tenderness (Suprapubic abdominal tenderness, mild). There is no rebound and no guarding.  Musculoskeletal: Normal range of motion. She exhibits no edema or tenderness.  Neurological: She is alert and oriented to person, place, and time. Coordination  normal.  Skin: Skin is warm and dry. No rash noted. She is not diaphoretic.  Psychiatric: Judgment normal. Her mood appears anxious. She is not actively hallucinating (Not currently hallucinating today). She exhibits a depressed mood. She expresses no suicidal ideation. She  expresses no suicidal plans.  Nursing note and vitals reviewed.     Assessment & Plan:   Problem List Items Addressed This Visit    None    Visit Diagnoses    Multiple drug resistant organism (MDRO) culture positive    -  Primary   Patient has UTI with multidrug resistant agents, was diagnosed with this in the hospital and they wanted to send her to infectious disease, we will do referral    Relevant Orders   Ambulatory referral to Infectious Disease   Auditory hallucinations       Here for hospital follow-up, has appointment with psychiatry, switch meds no hallucination since leaving hospital       Follow up plan: Return if symptoms worsen or fail to improve.  Counseling provided for all of the vaccine components Orders Placed This Encounter  Procedures  . Ambulatory referral to Infectious Disease    [name], MD [facility] 07/06/2016, 4:11 PM

## 2016-07-13 ENCOUNTER — Ambulatory Visit (HOSPITAL_COMMUNITY): Payer: Self-pay | Admitting: Psychiatry

## 2016-07-18 ENCOUNTER — Other Ambulatory Visit: Payer: Self-pay | Admitting: Family Medicine

## 2016-07-18 DIAGNOSIS — Z1231 Encounter for screening mammogram for malignant neoplasm of breast: Secondary | ICD-10-CM

## 2016-07-20 ENCOUNTER — Ambulatory Visit (INDEPENDENT_AMBULATORY_CARE_PROVIDER_SITE_OTHER): Payer: 59 | Admitting: Psychiatry

## 2016-07-20 ENCOUNTER — Encounter (HOSPITAL_COMMUNITY): Payer: Self-pay | Admitting: Psychiatry

## 2016-07-20 VITALS — BP 132/80 | HR 92 | Ht 69.0 in | Wt 392.2 lb

## 2016-07-20 DIAGNOSIS — F3160 Bipolar disorder, current episode mixed, unspecified: Secondary | ICD-10-CM

## 2016-07-20 DIAGNOSIS — Z8249 Family history of ischemic heart disease and other diseases of the circulatory system: Secondary | ICD-10-CM | POA: Diagnosis not present

## 2016-07-20 DIAGNOSIS — Z803 Family history of malignant neoplasm of breast: Secondary | ICD-10-CM

## 2016-07-20 DIAGNOSIS — F3131 Bipolar disorder, current episode depressed, mild: Secondary | ICD-10-CM

## 2016-07-20 DIAGNOSIS — Z9049 Acquired absence of other specified parts of digestive tract: Secondary | ICD-10-CM

## 2016-07-20 DIAGNOSIS — Z9889 Other specified postprocedural states: Secondary | ICD-10-CM

## 2016-07-20 DIAGNOSIS — Z90710 Acquired absence of both cervix and uterus: Secondary | ICD-10-CM | POA: Diagnosis not present

## 2016-07-20 DIAGNOSIS — Z818 Family history of other mental and behavioral disorders: Secondary | ICD-10-CM

## 2016-07-20 DIAGNOSIS — Z7982 Long term (current) use of aspirin: Secondary | ICD-10-CM

## 2016-07-20 DIAGNOSIS — Z808 Family history of malignant neoplasm of other organs or systems: Secondary | ICD-10-CM

## 2016-07-20 DIAGNOSIS — Z810 Family history of intellectual disabilities: Secondary | ICD-10-CM

## 2016-07-20 DIAGNOSIS — Z96653 Presence of artificial knee joint, bilateral: Secondary | ICD-10-CM | POA: Diagnosis not present

## 2016-07-20 DIAGNOSIS — Z9851 Tubal ligation status: Secondary | ICD-10-CM | POA: Diagnosis not present

## 2016-07-20 DIAGNOSIS — Z79899 Other long term (current) drug therapy: Secondary | ICD-10-CM

## 2016-07-20 MED ORDER — DULOXETINE HCL 60 MG PO CPEP: 60.0000 mg | ORAL_CAPSULE | Freq: Every day | ORAL | 0 refills | Status: DC

## 2016-07-20 MED ORDER — RISPERIDONE 2 MG PO TABS: 2.0000 mg | ORAL_TABLET | Freq: Every day | ORAL | 2 refills | Status: DC

## 2016-07-20 MED ORDER — DIVALPROEX SODIUM 500 MG PO DR TAB: 1500.0000 mg | DELAYED_RELEASE_TABLET | Freq: Every day | ORAL | 2 refills | Status: DC

## 2016-07-20 MED ORDER — RISPERIDONE 1 MG PO TABS: 1.0000 mg | ORAL_TABLET | Freq: Every day | ORAL | 0 refills | Status: DC

## 2016-07-20 NOTE — Progress Notes (Signed)
[facility] MD/PA/NP OP Progress Note  07/20/2016 4:14 PM Allison Sharp  MRN:  [id number]  Chief Complaint:  Subjective:  I was hospitalized last month.  I'm feeling better but I have no energy.  HPI: Allison came for her appointment with her elder sister 63.  She lives with her sister who is been very supportive.  Patient was recently admitted at [facility] due to increased depression and having suicidal thoughts and auditory hallucination.  She was initially assessed in the emergency room and recommended inpatient services.  At that time her Depakote level was low.  She is not sure if she stopped taking the Depakote but admitted she was very depressed and sad.  In the hospital her Risperdal was increased and Depakote was also adjusted.  Her Wellbutrin, Aricept, doxepin, gabapentin, Remeron and trazodone was discontinued.  She is taking Risperdal 1 mg in the morning and 2 at bedtime.  She admitted her hallucination paranoia and suicidal thoughts are much better but she feels very tired and lethargic.  Her sister endorse patient does not leave her house and remains in [place] and requires a lot of motivation to do things.  Recently she is very concerned about her 32 year old son who is been using drugs and she is thinking about her funeral.  Overall her depression and mood swing irritability and anger is improved but she still have lack of motivation and lack of energy.  She admitted lack of exercise and despite she has [employer] she does not leave her house.  She also stopped seeing therapist.  She has no tremors, shakes or any EPS.  She denies drinking alcohol or using any illegal substances.   Visit Diagnosis:    ICD-9-CM ICD-10-CM   1. Bipolar affective disorder, currently depressed, mild (HCC) 296.51 F31.31 DULoxetine (CYMBALTA) 60 MG capsule     risperiDONE (RISPERDAL) 2 MG tablet     risperiDONE (RISPERDAL) 1 MG tablet     divalproex (DEPAKOTE) 500 MG DR tablet     Hemoglobin A1c      Valproic acid level  2. Mixed bipolar I disorder (HCC) 296.60 F31.60 divalproex (DEPAKOTE) 500 MG DR tablet    Past Psychiatric History: Patient has significant history of psychiatric illness with multiple psychiatric inpatient treatment.  Her last admission was January 2018 at [facility].  She had history of suicidal attempt by taking overdose.  She had diagnosed with PTSD, depression, bipolar disorder.  In the past she had tried Paxil, Prozac, Wellbutrin, Effexor, Lexapro, amitriptyline, Cymbalta, Neurontin, trazodone, Thorazine, hydroxyzine, Luvox, Sinequan, Zyprexa and Latuda.  She has best responded on Risperdal and Cymbalta.  Past Medical History:  Past Medical History:  Diagnosis Date  . Allergy   . Anxiety   . Arthritis   . Asthma   . Bipolar 1 disorder ([place])   . CAD (coronary artery disease)   . Cellulitis   . CHF (congestive heart failure) (HCC)    diastolic dysfunction  . Chronic back pain   . Chronic headaches   . Complication of anesthesia    States she typically gets sick s/p anesthesia  . Contusion of sacrum   . COPD (chronic obstructive pulmonary disease) ([place])   . Depression   . Dyspnea    PFT 03/05/09 FEV1 2.77(98%), FVC 3.25(86%), FEV1% 85, TLC 5.88(99%), DLCO 60% ,  Methacholine challenge 03/16/09 normal ,  CT chest 03/12/09 no pulmonary disease  . Fungal infection   . GERD (gastroesophageal reflux disease)   . History  of colonoscopy 10/17/2002   by Dr Rehman-> distal non-specific proctitis, small ext hemorrhoids,   . HTN (hypertension)   . Hyperlipidemia   . Hyperthyroidism   . Hypothyroidism    States she only has hyperthyroidism  . Migraine headache   . Morbid obesity with body mass index of 50.0-59.9 in adult [place]) JAN 2011 370 LBS   2004 311 BMI 45.9  . Myocardial infarction    NOV 1997  . OSA on CPAP    she had been on 2L O2 at night but that was stopped  . PONV (postoperative nausea and vomiting)   . PTSD (post-traumatic stress  disorder)   . Suicidal ideation   . Urine incontinence   . Vitamin D deficiency     Past Surgical History:  Procedure Laterality Date  . ABDOMINAL HYSTERECTOMY     sept 1996  . APPENDECTOMY    . BACK SURGERY  2008  . CARDIAC CATHETERIZATION     nov 1997  . CHOLECYSTECTOMY    . COLONOSCOPY  10/17/2002    Distal proctitis, small external hemorrhoids, otherwise/  normal colonoscopy. Suspect rectal bleeding secondary to hemorrhoids  . ESOPHAGOGASTRODUODENOSCOPY  03/18/09   fundic gland polyps/mild gastritis  . HERNIA REPAIR  1978  . JOINT REPLACEMENT     bil knee replacement  . KNEE ARTHROSCOPY    . MULTIPLE EXTRACTIONS WITH ALVEOLOPLASTY N/A 08/16/2015   Procedure: EXTRACTION OF TEETH THREE, SIX, EIGHT, NINE, ELEVEN, FOURTEEN, FIFTEEN, TWENTY-EIGHT WITH ALVEOLOPLASTY;  Surgeon: Diona Browner, DDS;  Location: [facility];  Service: Oral Surgery;  Laterality: N/A;  . TONSILLECTOMY    . TOTAL VAGINAL HYSTERECTOMY    . TUBAL LIGATION      Family Psychiatric History: Reviewed.  Family History:  Family History  Problem Relation Age of Onset  . Hypertension Mother   . Bipolar disorder Mother   . Dementia Mother   . Depression Mother   . Heart attack Mother   . AAA (abdominal aortic aneurysm) Mother   . Coronary artery disease Father   . Alcohol abuse Father   . Hypertension Brother   . Coronary artery disease Brother   . Bipolar disorder Brother   . Depression Brother   . Depression Sister   . Paranoid behavior Sister   . Cancer Sister     breast  . Bipolar disorder Sister   . Depression Sister   . Hypertension Sister   . Cancer Son     thyroid  . Cancer Maternal Aunt     breast metastatized to brain  . Anesthesia problems Neg Hx   . Hypotension Neg Hx   . Malignant hyperthermia Neg Hx   . Pseudochol deficiency Neg Hx     Social History:  Social History   Social History  . Marital status: Single    Spouse name: N/A  . Number of children: 2  . Years of education:  N/A   Occupational History  . Disabled Unemployed    back problems   Social History Main Topics  . Smoking status: Never Smoker  . Smokeless tobacco: Never Used  . Alcohol use No  . Drug use: No  . Sexual activity: No   Other Topics Concern  . None   Social History Narrative   [name] is disabled and lives with her sister, Shemicka. She has another sister with whom she has lived with in the past, and who is a Designer, jewellery, but not currently practicing.   She has two grown  sons that she does not see often, as they live in other states. She has 5 grand children.   [name] has a long history of mental illness including depression, PTSD, suicidal and homicidal ideation.   She has been obese most all of her life. Her weight has significantly impacted her QOL. She recently lost 20 lbs. By decreasing portion size & increasing proteins.     Allergies:  Allergies  Allergen Reactions  . Ativan [Lorazepam] Other (See Comments)    Delirium  . Haldol [Haloperidol] Other (See Comments)    Hallucinating   . Naproxen Nausea And Vomiting    Metabolic Disorder Labs: Recent Results (from the past 2160 hour(s))  CBC with Differential/Platelet     Status: None   Collection Time: 06/20/16  1:30 PM  Result Value Ref Range   WBC 9.4 4.0 - 10.5 K/uL   RBC 4.46 3.87 - 5.11 MIL/uL   Hemoglobin 13.7 12.0 - 15.0 g/dL   HCT 40.3 36.0 - 46.0 %   MCV 90.4 78.0 - 100.0 fL   MCH 30.7 26.0 - 34.0 pg   MCHC 34.0 30.0 - 36.0 g/dL   RDW 13.1 11.5 - 15.5 %   Platelets 182 150 - 400 K/uL   Neutrophils Relative % 72 %   Neutro Abs 6.7 1.7 - 7.7 K/uL   Lymphocytes Relative 20 %   Lymphs Abs 1.9 0.7 - 4.0 K/uL   Monocytes Relative 6 %   Monocytes Absolute 0.6 0.1 - 1.0 K/uL   Eosinophils Relative 2 %   Eosinophils Absolute 0.2 0.0 - 0.7 K/uL   Basophils Relative 0 %   Basophils Absolute 0.0 0.0 - 0.1 K/uL  Comprehensive metabolic panel     Status: Abnormal   Collection Time: 06/20/16  1:30 PM   Result Value Ref Range   Sodium 139 135 - 145 mmol/L   Potassium 3.8 3.5 - 5.1 mmol/L   Chloride 101 101 - 111 mmol/L   CO2 30 22 - 32 mmol/L   Glucose, Bld 128 (H) 65 - 99 mg/dL   BUN 15 6 - 20 mg/dL   Creatinine, Ser 0.85 0.44 - 1.00 mg/dL   Calcium 9.4 8.9 - 10.3 mg/dL   Total Protein 7.1 6.5 - 8.1 g/dL   Albumin 4.0 3.5 - 5.0 g/dL   AST 19 15 - 41 U/L   ALT 14 14 - 54 U/L   Alkaline Phosphatase 68 38 - 126 U/L   Total Bilirubin 0.6 0.3 - 1.2 mg/dL   GFR calc non Af Amer >60 >60 mL/min   GFR calc Af Amer >60 >60 mL/min    Comment: (NOTE) The eGFR has been calculated using the CKD EPI equation. This calculation has not been validated in all clinical situations. eGFR's persistently <60 mL/min signify possible Chronic Kidney Disease.    Anion gap 8 5 - 15  Ethanol     Status: None   Collection Time: 06/20/16  1:30 PM  Result Value Ref Range   Alcohol, Ethyl (B) <5 <5 mg/dL    Comment:        LOWEST DETECTABLE LIMIT FOR SERUM ALCOHOL IS 5 mg/dL FOR MEDICAL PURPOSES ONLY   Valproic acid level     Status: None   Collection Time: 06/20/16  1:30 PM  Result Value Ref Range   Valproic Acid Lvl 67 50.0 - 100.0 ug/mL  Rapid urine drug screen (hospital performed)     Status: None   Collection Time: 06/20/16  2:25  PM  Result Value Ref Range   Opiates NONE DETECTED NONE DETECTED   Cocaine NONE DETECTED NONE DETECTED   Benzodiazepines NONE DETECTED NONE DETECTED   Amphetamines NONE DETECTED NONE DETECTED   Tetrahydrocannabinol NONE DETECTED NONE DETECTED   Barbiturates NONE DETECTED NONE DETECTED    Comment:        DRUG SCREEN FOR MEDICAL PURPOSES ONLY.  IF CONFIRMATION IS NEEDED FOR ANY PURPOSE, NOTIFY LAB WITHIN 5 DAYS.        LOWEST DETECTABLE LIMITS FOR URINE DRUG SCREEN Drug Class       Cutoff (ng/mL) Amphetamine      1000 Barbiturate      200 Benzodiazepine   789 Tricyclics       381 Opiates          300 Cocaine          300 THC              50   Urinalysis,  Routine w reflex microscopic     Status: Abnormal   Collection Time: 06/20/16  2:25 PM  Result Value Ref Range   Color, Urine YELLOW YELLOW   APPearance HAZY (A) CLEAR   Specific Gravity, Urine 1.013 1.005 - 1.030   pH 6.0 5.0 - 8.0   Glucose, UA NEGATIVE NEGATIVE mg/dL   Hgb urine dipstick NEGATIVE NEGATIVE   Bilirubin Urine NEGATIVE NEGATIVE   Ketones, ur NEGATIVE NEGATIVE mg/dL   Protein, ur NEGATIVE NEGATIVE mg/dL   Nitrite NEGATIVE NEGATIVE   Leukocytes, UA SMALL (A) NEGATIVE   RBC / HPF 0-5 0 - 5 RBC/hpf   WBC, UA TOO NUMEROUS TO COUNT 0 - 5 WBC/hpf   Bacteria, UA MANY (A) NONE SEEN   Squamous Epithelial / LPF 0-5 (A) NONE SEEN   Mucous PRESENT    Lab Results  Component Value Date   HGBA1C 5.8 (H) 11/08/2015   MPG 120 11/08/2015   MPG 120 (H) 03/30/2015   No results found for: PROLACTIN Lab Results  Component Value Date   CHOL 236 (H) 07/02/2015   TRIG 181 (H) 07/02/2015   HDL 39 (L) 07/02/2015   CHOLHDL 6.1 (H) 07/02/2015   VLDL 30.0 12/10/2013   LDLCALC 161 (H) 07/02/2015   LDLCALC 89 12/10/2013     Current Medications: Current Outpatient Prescriptions  Medication Sig Dispense Refill  . albuterol (PROVENTIL HFA;VENTOLIN HFA) 108 (90 Base) MCG/ACT inhaler Inhale 2 puffs into the lungs every 4 (four) hours as needed for wheezing or shortness of breath. 1 Inhaler 5  . albuterol (PROVENTIL) (2.5 MG/3ML) 0.083% nebulizer solution Take 3 mLs (2.5 mg total) by nebulization every 4 (four) hours as needed for wheezing or shortness of breath. 75 mL 0  . aspirin 81 MG chewable tablet Chew 81 mg by mouth daily.    Marland Kitchen atorvastatin (LIPITOR) 20 MG tablet TAKE 1 TABLET DAILY 90 tablet 0  . BREO ELLIPTA 100-25 MCG/INH AEPB INHALE 1 PUFF INTO THE LUNGS DAILY 60 each 5  . cetirizine (ZYRTEC) 10 MG tablet TAKE 1 TABLET ONCE DAILY 30 tablet 0  . cloNIDine (CATAPRES) 0.2 MG tablet Take 0.2 mg by mouth at bedtime.    . diclofenac sodium (VOLTAREN) 1 % GEL Apply to affected area twice  daily as needed (Patient taking differently: Apply 2 g topically 2 (two) times daily as needed (PAIN). ) 100 g 5  . divalproex (DEPAKOTE) 500 MG DR tablet Take 3 tablets (1,500 mg total) by mouth at bedtime. 90 tablet 2  .  DULoxetine (CYMBALTA) 60 MG capsule Take 1 capsule (60 mg total) by mouth daily. 90 capsule 0  . fluticasone (FLONASE) 50 MCG/ACT nasal spray Place 1 spray into both nostrils 2 (two) times daily as needed for allergies or rhinitis. 16 g 6  . levothyroxine (SYNTHROID, LEVOTHROID) 112 MCG tablet TAKE (1) TABLET DAILY BE- FORE BREAKFAST FOR THYROID (Patient taking differently: TAKE 112 MCG BY MOUTH EACH DAY BEFORE BREAKFAST FOR THYROID) 90 tablet 1  . lisinopril-hydrochlorothiazide (PRINZIDE,ZESTORETIC) 20-25 MG tablet Take 1 tablet by mouth daily. 30 tablet 5  . meloxicam (MOBIC) 15 MG tablet Take 1 tablet (15 mg total) by mouth daily. 30 tablet 2  . Multiple Vitamin (MULTIVITAMIN WITH MINERALS) TABS Take 1 tablet by mouth daily. For nutritional supplementation. 30 tablet 0  . Multiple Vitamins-Minerals (MULTIVITAMINS THER. W/MINERALS) TABS tablet Take 1 tablet by mouth daily.    . pantoprazole (PROTONIX) 40 MG tablet Take 40 mg by mouth daily.    . pseudoephedrine (SUDAFED) 120 MG 12 hr tablet Take 120 mg by mouth 2 (two) times daily.    . risperiDONE (RISPERDAL) 1 MG tablet Take 1 tablet (1 mg total) by mouth daily. 90 tablet 0  . risperiDONE (RISPERDAL) 2 MG tablet Take 1 tablet (2 mg total) by mouth at bedtime. 69 tablet 2   No current facility-administered medications for this visit.     Neurologic: Headache: No Seizure: No Paresthesias: No  Musculoskeletal: Strength & Muscle Tone: within normal limits Gait & Station: normal Patient leans: N/A  Psychiatric Specialty Exam: Review of Systems  Constitutional: Positive for malaise/fatigue.  HENT: Negative.   Respiratory: Negative.   Cardiovascular: Negative.   Musculoskeletal: Positive for back pain and joint pain.   Skin: Negative.   Neurological: Negative.     Blood pressure 132/80, pulse 92, height '5\' 9"'  (1.753 m), weight (!) 392 lb 3.2 oz (177.9 kg), last menstrual period 02/18/1995.Body mass index is 57.92 kg/m.  General Appearance: Casual and Obese  Eye Contact:  Fair  Speech:  Normal Rate  Volume:  Normal  Mood:  Depressed and Tired  Affect:  Congruent  Thought Process:  Goal Directed  Orientation:  Full (Time, Place, and Person)  Thought Content: WDL and Logical   Suicidal Thoughts:  No  Homicidal Thoughts:  No  Memory:  Immediate;   Good Recent;   Good Remote;   Good  Judgement:  Good  Insight:  Good  Psychomotor Activity:  Decreased  Concentration:  Concentration: Fair and Attention Span: Fair  Recall:  Good  Fund of Knowledge: Good  Language: Good  Akathisia:  No  Handed:  Right  AIMS (if indicated):  0  Assets:  Communication Skills Desire for Improvement Housing Social Support  ADL's:  Intact  Cognition: WNL  Sleep:  Good     Assessment: Bipolar disorder mixed.  Rule out schizoaffective disorder depressed type.  Plan: I review her medication, collateral information from the emergency room and recent discharge summary.  She is no longer taking Sinequan, trazodone, Remeron and Wellbutrin.  [name] since Risperdal and Depakote is increased.  However she has been tired.  I recommended to try moving Risperdal all tablet at bedtime.  She is taking 1 mg in the morning and 2 at bedtime but knew dose will be 3 mg at bedtime.  Continue Depakote 1500 mg at bedtime.  I encourage her to walk at least 10-15 minutes every day.  Encouraged to start going to gym since she had membership.  I will also recommended to resume therapy with Eloise Levels.  She has no tremors or shakes.  Discuss psychosocial issues, patient's son is using drugs and I discuss that she should try to get him help.  I will also do Depakote level and he will do hemoglobin A1c.  Follow-up in 3 months.  Discussed medication side effects and benefits.  Recommended to call us back if there is any question, concern or worsening of the symptoms.  Discuss safety plan that anytime having active suicidal thoughts or homicidal thoughts and she need to call 911 or go to the local emergency room.  Allison Sharp T., MD 07/20/2016, 4:14 PM

## 2016-07-22 ENCOUNTER — Other Ambulatory Visit: Payer: Self-pay | Admitting: Family Medicine

## 2016-07-22 DIAGNOSIS — J4531 Mild persistent asthma with (acute) exacerbation: Secondary | ICD-10-CM

## 2016-08-02 ENCOUNTER — Ambulatory Visit: Payer: Self-pay

## 2016-08-14 ENCOUNTER — Ambulatory Visit (HOSPITAL_COMMUNITY): Payer: Self-pay | Admitting: Clinical

## 2016-08-16 ENCOUNTER — Other Ambulatory Visit: Payer: Self-pay | Admitting: Family Medicine

## 2016-08-18 DIAGNOSIS — R0902 Hypoxemia: Secondary | ICD-10-CM | POA: Diagnosis not present

## 2016-08-18 DIAGNOSIS — G4733 Obstructive sleep apnea (adult) (pediatric): Secondary | ICD-10-CM | POA: Diagnosis not present

## 2016-08-21 ENCOUNTER — Encounter: Payer: Self-pay | Admitting: Family Medicine

## 2016-08-21 ENCOUNTER — Ambulatory Visit (INDEPENDENT_AMBULATORY_CARE_PROVIDER_SITE_OTHER): Payer: Medicare Other | Admitting: Family Medicine

## 2016-08-21 ENCOUNTER — Other Ambulatory Visit: Payer: Self-pay | Admitting: Family Medicine

## 2016-08-21 VITALS — BP 121/82 | HR 99 | Temp 97.9°F | Ht 69.0 in | Wt 390.0 lb

## 2016-08-21 DIAGNOSIS — E039 Hypothyroidism, unspecified: Secondary | ICD-10-CM

## 2016-08-21 DIAGNOSIS — M7631 Iliotibial band syndrome, right leg: Secondary | ICD-10-CM | POA: Diagnosis not present

## 2016-08-21 DIAGNOSIS — A084 Viral intestinal infection, unspecified: Secondary | ICD-10-CM | POA: Diagnosis not present

## 2016-08-21 MED ORDER — ONDANSETRON 4 MG PO TBDP: 4.0000 mg | ORAL_TABLET | Freq: Three times a day (TID) | ORAL | 0 refills | Status: DC | PRN

## 2016-08-21 MED ORDER — PREDNISONE 20 MG PO TABS: ORAL_TABLET | ORAL | 0 refills | Status: DC

## 2016-08-21 NOTE — Progress Notes (Signed)
BP 121/82   Pulse 99   Temp 97.9 F (36.6 C) (Oral)   Ht 5\' 9"  (1.753 m)   Wt (!) 390 lb (176.9 kg)   LMP 02/18/1995   BMI 57.59 kg/m    Subjective:    Patient ID: Allison Sharp, female    DOB: 01/11/1956, 61 y.o.   MRN: [id number]  HPI: Allison Sharp is a 61 y.o. female presenting on 08/21/2016 for Nausea and vomiting (x 3 days, not sure if it's related but she started Melatonin one week ago) and Pain in right side radiating into right buttock   HPI Nausea and vomiting Patient comes in complaining of nausea and vomiting has been going on for 3 days. She started melatonin one week ago but did not know if it could be correlated to that or if she just had a stomach illness from 70 at work. They have been going around her workplace. She has been having some diarrhea associated with it but mostly nausea and vomiting. She's had about 5-6 episodes of nausea and vomiting per day.  Right sided sciatic pain Patient feels like she is having right sided sciatic pain that started initially around her buttocks a couple weeks ago but now it is more on the lateral aspect of her right hip. She says she initially took her muscle relaxer which helped the buttocks region but now she's having a lot on the right side. She feels like if she stands on 1 foot she is very unsteady on that side but she can stand on the other leg just fine. She also has issues going upstairs and downstairs and getting up from a seated position. She says she just feels off balance and wobbly.  Relevant past medical, surgical, family and social history reviewed and updated as indicated. Interim medical history since our last visit reviewed. Allergies and medications reviewed and updated.  Review of Systems  Constitutional: Negative for chills and fever.  Respiratory: Negative for chest tightness and shortness of breath.   Cardiovascular: Negative for chest pain and leg swelling.  Gastrointestinal: Positive for abdominal  pain, diarrhea, nausea and vomiting. Negative for abdominal distention, blood in stool and constipation.  Genitourinary: Negative for difficulty urinating and dysuria.  Musculoskeletal: Positive for arthralgias and myalgias. Negative for back pain, gait problem and joint swelling.  Skin: Negative for rash.  Neurological: Negative for light-headedness and headaches.  Psychiatric/Behavioral: Negative for agitation and behavioral problems.  All other systems reviewed and are negative.   Per HPI unless specifically indicated above        Objective:    BP 121/82   Pulse 99   Temp 97.9 F (36.6 C) (Oral)   Ht 5\' 9"  (1.753 m)   Wt (!) 390 lb (176.9 kg)   LMP 02/18/1995   BMI 57.59 kg/m   Wt Readings from Last 3 Encounters:  08/21/16 (!) 390 lb (176.9 kg)  07/06/16 (!) 390 lb (176.9 kg)  06/20/16 (!) 387 lb (175.5 kg)    Physical Exam  Constitutional: She is oriented to person, place, and time. She appears well-developed and well-nourished. No distress.  Eyes: Conjunctivae are normal.  Cardiovascular: Normal rate, regular rhythm, normal heart sounds and intact distal pulses.   No murmur heard. Pulmonary/Chest: Effort normal and breath sounds normal. No respiratory distress. She has no wheezes. She has no rales.  Abdominal: Soft. Bowel sounds are normal. She exhibits no distension. There is no hepatosplenomegaly. There is tenderness in the epigastric area. There  is no rigidity, no rebound, no guarding and no CVA tenderness.  Musculoskeletal: Normal range of motion. She exhibits no edema or tenderness.       Legs: Neurological: She is alert and oriented to person, place, and time. Coordination normal.  Skin: Skin is warm and dry. No rash noted. She is not diaphoretic.  Psychiatric: She has a normal mood and affect. Her behavior is normal.  Nursing note and vitals reviewed.     Assessment & Plan:   Problem List Items Addressed This Visit    None    Visit Diagnoses    Viral  gastroenteritis    -  Primary   Relevant Medications   ondansetron (ZOFRAN ODT) 4 MG disintegrating tablet   It band syndrome, right       Relevant Medications   predniSONE (DELTASONE) 20 MG tablet      Instructed the patient that she may want to hold off on the prednisone until after her stomach issues settle down.  Follow up plan: Return if symptoms worsen or fail to improve.  Counseling provided for all of the vaccine components No orders of the defined types were placed in this encounter.   [name], MD [facility] 08/21/2016, 4:02 PM

## 2016-08-23 ENCOUNTER — Ambulatory Visit: Payer: Self-pay

## 2016-08-28 ENCOUNTER — Encounter: Payer: Self-pay | Admitting: Internal Medicine

## 2016-08-28 ENCOUNTER — Ambulatory Visit (INDEPENDENT_AMBULATORY_CARE_PROVIDER_SITE_OTHER): Payer: Medicare Other | Admitting: Internal Medicine

## 2016-08-28 DIAGNOSIS — N3090 Cystitis, unspecified without hematuria: Secondary | ICD-10-CM | POA: Diagnosis not present

## 2016-08-28 NOTE — Progress Notes (Signed)
[facility] for Infectious Disease  Reason for Consult: Recurrent cystitis Referring Physician: Dr. Vonna Kotyk Dettinger  Patient Active Problem List   Diagnosis Date Noted  . Severe obesity (BMI >= 40) ([place]) 07/05/2015  . Hyperlipidemia LDL goal <130 07/05/2015  . MDD (major depressive disorder), single episode, severe with psychotic features ([place]) 01/21/2015  . 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 Cystitis 04/01/2014  . Bilateral knee pain 01/07/2014  . Scar condition and fibrosis of skin 12/11/2013  . Essential hypertension, benign 07/01/2013  . Acute respiratory failure ([place]) 11/05/2012  . Asthma with acute exacerbation 11/04/2012  . OSA on CPAP 11/04/2012  . Back pain, chronic 08/27/2012  . Vitamin D insufficiency 04/04/2012  . Insomnia due to mental disorder 04/04/2012  . Anxiety 04/04/2012  . OCD (obsessive compulsive disorder) 04/04/2012  . PTSD (post-traumatic stress disorder) 06/28/2011  . Coronary artery disease 08/26/2010  . FATTY LIVER DISEASE 03/29/2009  . GERD 03/01/2009  . Morbid obesity ([place]) 02/15/2009  . HYPOTHYROIDISM 02/12/2009    Patient's Medications  New Prescriptions   No medications on file  Previous Medications   ALBUTEROL (PROVENTIL) (2.5 MG/3ML) 0.083% NEBULIZER SOLUTION    Take 3 mLs (2.5 mg total) by nebulization every 4 (four) hours as needed for wheezing or shortness of breath.   ASPIRIN 81 MG CHEWABLE TABLET    Chew 81 mg by mouth daily.   ATORVASTATIN (LIPITOR) 20 MG TABLET    TAKE 1 TABLET DAILY   BREO ELLIPTA 100-25 MCG/INH AEPB    INHALE 1 PUFF INTO THE LUNGS DAILY   CETIRIZINE (ZYRTEC) 10 MG TABLET    TAKE 1 TABLET ONCE DAILY   CLONIDINE (CATAPRES) 0.1 MG TABLET    TAKE ONE TABLET AT BEDTIME   CLONIDINE (CATAPRES) 0.2 MG TABLET    Take 0.2 mg by mouth at bedtime.   DICLOFENAC SODIUM (VOLTAREN) 1 % GEL    Apply to affected area twice daily as needed   DIVALPROEX (DEPAKOTE) 500 MG DR TABLET    Take 3 tablets (1,500 mg  total) by mouth at bedtime.   DULOXETINE (CYMBALTA) 60 MG CAPSULE    Take 1 capsule (60 mg total) by mouth daily.   FLUTICASONE (FLONASE) 50 MCG/ACT NASAL SPRAY    Place 1 spray into both nostrils 2 (two) times daily as needed for allergies or rhinitis.   LEVOTHYROXINE (SYNTHROID, LEVOTHROID) 112 MCG TABLET    TAKE (1) TABLET DAILY BE- FORE BREAKFAST FOR THYROID   LISINOPRIL-HYDROCHLOROTHIAZIDE (PRINZIDE,ZESTORETIC) 20-25 MG TABLET    Take 1 tablet by mouth daily.   MELOXICAM (MOBIC) 15 MG TABLET    Take 1 tablet (15 mg total) by mouth daily.   MULTIPLE VITAMIN (MULTIVITAMIN WITH MINERALS) TABS    Take 1 tablet by mouth daily. For nutritional supplementation.   ONDANSETRON (ZOFRAN ODT) 4 MG DISINTEGRATING TABLET    Take 1 tablet (4 mg total) by mouth every 8 (eight) hours as needed for nausea or vomiting.   PANTOPRAZOLE (PROTONIX) 40 MG TABLET    Take 40 mg by mouth daily.   PROAIR HFA 108 (90 BASE) MCG/ACT INHALER    USE 2 PUFF EVERY 4 HOURS AS NEEDED FOR WHEEZING OR SHORTNESS OF BREATH   RISPERIDONE (RISPERDAL) 1 MG TABLET    Take 1 tablet (1 mg total) by mouth daily.   RISPERIDONE (RISPERDAL) 2 MG TABLET    Take 1 tablet (2 mg total) by mouth at bedtime.  Modified Medications   No medications  on file  Discontinued Medications   MULTIPLE VITAMINS-MINERALS (MULTIVITAMINS THER. W/MINERALS) TABS TABLET    Take 1 tablet by mouth daily.   PREDNISONE (DELTASONE) 20 MG TABLET    2 po at same time daily for 5 days   PSEUDOEPHEDRINE (SUDAFED) 120 MG 12 HR TABLET    Take 120 mg by mouth 2 (two) times daily.    Recommendations: 1. Urine for urinalysis and urine culture 2. I will call her with results to review management options   Assessment: She has persistent irritative symptoms compatible with cystitis. I will collect urine for urinalysis and repeat culture. I will call her with results to review management options.   HPI: Allison Sharp is a 61 y.o. female with a history of recurrent cystitis.  She states that she has had problems with recurrent bladder infections since the 1990s. She estimates that she has been treated with antibiotics 3-4 times each year. In the past antibiotic treatment was effective in relieving her dysuria and urinary frequency. In January she developed worsening depression, paranoia and auditory hallucinations and was hospitalized briefly at [facility]. While there were urine culture revealed an ESBL Escherichia coli sensitive only to ertapenem. Infectious disease was consulted and she was treated with oral fosfomycin as a single 3 g dose. She had transient improvement in her symptoms for a few days but then the dysuria, suprapubic discomfort and urinary frequency returned. She has been bothered by this every day. When she was in the hospital she had a CT scan which revealed a 2-3 mm nonobstructing right lower pole stone.  Review of Systems: Review of Systems  Constitutional: Negative for chills, diaphoresis, fever, malaise/fatigue and weight loss.  HENT: Negative for sore throat.   Respiratory: Negative for cough, sputum production and shortness of breath.   Cardiovascular: Negative for chest pain.  Gastrointestinal: Negative for abdominal pain, diarrhea, heartburn, nausea and vomiting.  Genitourinary: Positive for dysuria and frequency. Negative for flank pain, hematuria and urgency.  Musculoskeletal: Positive for back pain. Negative for joint pain and myalgias.  Skin: Negative for rash.  Neurological: Negative for dizziness and headaches.  Psychiatric/Behavioral: Negative for depression.      Past Medical History:  Diagnosis Date  . Allergy   . Anxiety   . Arthritis   . Asthma   . Bipolar 1 disorder ([place])   . CAD (coronary artery disease)   . Cellulitis   . CHF (congestive heart failure) ([facility])    diastolic dysfunction  . Chronic back pain   . Chronic headaches   . Complication of anesthesia    States she typically gets sick  s/p anesthesia  . Contusion of sacrum   . COPD (chronic obstructive pulmonary disease) ([place])   . Depression   . Dyspnea    PFT 03/05/09 FEV1 2.77(98%), FVC 3.25(86%), FEV1% 85, TLC 5.88(99%), DLCO 60% ,  Methacholine challenge 03/16/09 normal ,  CT chest 03/12/09 no pulmonary disease  . Fungal infection   . GERD (gastroesophageal reflux disease)   . History of colonoscopy 10/17/2002   by Dr Rehman-> distal non-specific proctitis, small ext hemorrhoids,   . HTN (hypertension)   . Hyperlipidemia   . Hyperthyroidism   . Hypothyroidism    States she only has hyperthyroidism  . Migraine headache   . Morbid obesity with body mass index of 50.0-59.9 in adult [facility]) JAN 2011 370 LBS   2004 311 BMI 45.9  . Myocardial infarction    NOV 1997  . OSA  on CPAP    she had been on 2L O2 at night but that was stopped  . PONV (postoperative nausea and vomiting)   . PTSD (post-traumatic stress disorder)   . Suicidal ideation   . Urine incontinence   . Vitamin D deficiency     Social History  Substance Use Topics  . Smoking status: Never Smoker  . Smokeless tobacco: Never Used  . Alcohol use No    Family History  Problem Relation Age of Onset  . Hypertension Mother   . Bipolar disorder Mother   . Dementia Mother   . Depression Mother   . Heart attack Mother   . AAA (abdominal aortic aneurysm) Mother   . Coronary artery disease Father   . Alcohol abuse Father   . Hypertension Brother   . Coronary artery disease Brother   . Bipolar disorder Brother   . Depression Brother   . Depression Sister   . Paranoid behavior Sister   . Cancer Sister     breast  . Bipolar disorder Sister   . Depression Sister   . Hypertension Sister   . Cancer Son     thyroid  . Cancer Maternal Aunt     breast metastatized to brain  . Anesthesia problems Neg Hx   . Hypotension Neg Hx   . Malignant hyperthermia Neg Hx   . Pseudochol deficiency Neg Hx    Allergies  Allergen Reactions  . Ativan  [Lorazepam] Other (See Comments)    Delirium  . Haldol [Haloperidol] Other (See Comments)    Hallucinating   . Naproxen Nausea And Vomiting    OBJECTIVE: Vitals:   08/28/16 1346  BP: (!) 152/80  Pulse: (!) 105  Temp: 97.8 F (36.6 C)  TempSrc: Oral  Weight: (!) 391 lb (177.4 kg)  Height: 5\' 9"  (1.753 m)   Body mass index is 57.74 kg/m.   Physical Exam  Constitutional: She is oriented to person, place, and time. No distress.  Cardiovascular: Normal rate and regular rhythm.   No murmur heard. Pulmonary/Chest: Effort normal and breath sounds normal.  Abdominal: Soft.  Mild suprapubic tenderness with palpation. No CVA tenderness.  Neurological: She is alert and oriented to person, place, and time.  Skin: No rash noted.  Psychiatric: Mood and affect normal.    Microbiology: No results found for this or any previous visit (from the past 240 hour(s)).  Michel Bickers, MD [facility] [phone] pager   [phone] cell 08/28/2016, 2:15 PM

## 2016-08-29 LAB — URINALYSIS, ROUTINE W REFLEX MICROSCOPIC
Bilirubin Urine: NEGATIVE
GLUCOSE, UA: NEGATIVE
HGB URINE DIPSTICK: NEGATIVE
KETONES UR: NEGATIVE
NITRITE: NEGATIVE
PH: 7 (ref 5.0–8.0)
PROTEIN: NEGATIVE
Specific Gravity, Urine: 1.012 (ref 1.001–1.035)

## 2016-08-29 LAB — URINALYSIS, MICROSCOPIC ONLY
BACTERIA UA: NONE SEEN [HPF]
CRYSTALS: NONE SEEN [HPF]
Casts: NONE SEEN [LPF]
YEAST: NONE SEEN [HPF]

## 2016-08-30 ENCOUNTER — Encounter: Payer: Self-pay | Admitting: Orthopaedic Surgery

## 2016-08-30 ENCOUNTER — Ambulatory Visit (INDEPENDENT_AMBULATORY_CARE_PROVIDER_SITE_OTHER): Payer: Medicare Other | Admitting: Orthopaedic Surgery

## 2016-08-30 ENCOUNTER — Ambulatory Visit (INDEPENDENT_AMBULATORY_CARE_PROVIDER_SITE_OTHER): Payer: Medicare Other

## 2016-08-30 VITALS — BP 114/65 | HR 107 | Temp 97.5°F | Ht 69.0 in | Wt 388.0 lb

## 2016-08-30 DIAGNOSIS — G8929 Other chronic pain: Secondary | ICD-10-CM | POA: Diagnosis not present

## 2016-08-30 DIAGNOSIS — M79671 Pain in right foot: Secondary | ICD-10-CM | POA: Diagnosis not present

## 2016-08-30 DIAGNOSIS — M25571 Pain in right ankle and joints of right foot: Secondary | ICD-10-CM

## 2016-08-30 LAB — URINE CULTURE

## 2016-08-30 MED ORDER — NAPROXEN 500 MG PO TABS: 500.0000 mg | ORAL_TABLET | Freq: Two times a day (BID) | ORAL | 5 refills | Status: DC

## 2016-08-30 NOTE — Progress Notes (Signed)
Subjective:    Patient ID: Allison Sharp, female    DOB: 1955-11-30, 61 y.o.   MRN: [id number]  HPI She has had intermittent right foot and ankle pain since 2014 when I saw her last.  She developed more pain in the left ankle over the last several months where she has pain most of the time.  She has no trauma.  She has no redness or numbness.  She has not taken anything other than Tylenol for pain and that does not help.  She has used heat and ice at times but not regularly.  She has changed shoe wear.  She is tired of hurting and having a slight limp.  She wants to be more active.  Old records were reviewed.   Review of Systems  HENT: Negative for congestion.   Respiratory: Positive for shortness of breath. Negative for cough.   Cardiovascular: Positive for leg swelling. Negative for chest pain.  Endocrine: Positive for cold intolerance.  Musculoskeletal: Positive for arthralgias, back pain, gait problem and joint swelling.  Allergic/Immunologic: Positive for environmental allergies.  Neurological: Positive for headaches.  Psychiatric/Behavioral: The patient is nervous/anxious.    Past Medical History:  Diagnosis Date  . Allergy   . Anxiety   . Arthritis   . Asthma   . Bipolar 1 disorder ([place])   . CAD (coronary artery disease)   . Cellulitis   . CHF (congestive heart failure) ([facility])    diastolic dysfunction  . Chronic back pain   . Chronic headaches   . Complication of anesthesia    States she typically gets sick s/p anesthesia  . Contusion of sacrum   . COPD (chronic obstructive pulmonary disease) ([place])   . Depression   . Dyspnea    PFT 03/05/09 FEV1 2.77(98%), FVC 3.25(86%), FEV1% 85, TLC 5.88(99%), DLCO 60% ,  Methacholine challenge 03/16/09 normal ,  CT chest 03/12/09 no pulmonary disease  . Fungal infection   . GERD (gastroesophageal reflux disease)   . History of colonoscopy 10/17/2002   by Dr Rehman-> distal non-specific proctitis, small ext hemorrhoids,   . HTN  (hypertension)   . Hyperlipidemia   . Hyperthyroidism   . Hypothyroidism    States she only has hyperthyroidism  . Migraine headache   . Morbid obesity with body mass index of 50.0-59.9 in adult [facility]) JAN 2011 370 LBS   2004 311 BMI 45.9  . Myocardial infarction    NOV 1997  . OSA on CPAP    she had been on 2L O2 at night but that was stopped  . PONV (postoperative nausea and vomiting)   . PTSD (post-traumatic stress disorder)   . Suicidal ideation   . Urine incontinence   . Vitamin D deficiency     Past Surgical History:  Procedure Laterality Date  . ABDOMINAL HYSTERECTOMY     sept 1996  . APPENDECTOMY    . BACK SURGERY  2008  . CARDIAC CATHETERIZATION     nov 1997  . CHOLECYSTECTOMY    . COLONOSCOPY  10/17/2002    Distal proctitis, small external hemorrhoids, otherwise/  normal colonoscopy. Suspect rectal bleeding secondary to hemorrhoids  . ESOPHAGOGASTRODUODENOSCOPY  03/18/09   fundic gland polyps/mild gastritis  . HERNIA REPAIR  1978  . JOINT REPLACEMENT     bil knee replacement  . KNEE ARTHROSCOPY    . MULTIPLE EXTRACTIONS WITH ALVEOLOPLASTY N/A 08/16/2015   Procedure: EXTRACTION OF TEETH THREE, SIX, EIGHT, NINE, ELEVEN, FOURTEEN, FIFTEEN, TWENTY-EIGHT WITH ALVEOLOPLASTY;  Surgeon: Diona Browner, DDS;  Location: [place];  Service: Oral Surgery;  Laterality: N/A;  . TONSILLECTOMY    . TOTAL VAGINAL HYSTERECTOMY    . TUBAL LIGATION      Current Outpatient Prescriptions on File Prior to Visit  Medication Sig Dispense Refill  . albuterol (PROVENTIL) (2.5 MG/3ML) 0.083% nebulizer solution Take 3 mLs (2.5 mg total) by nebulization every 4 (four) hours as needed for wheezing or shortness of breath. 75 mL 0  . aspirin 81 MG chewable tablet Chew 81 mg by mouth daily.    Marland Kitchen atorvastatin (LIPITOR) 20 MG tablet TAKE 1 TABLET DAILY 90 tablet 0  . BREO ELLIPTA 100-25 MCG/INH AEPB INHALE 1 PUFF INTO THE LUNGS DAILY 60 each 5  . cetirizine (ZYRTEC) 10 MG tablet TAKE 1 TABLET ONCE  DAILY 30 tablet 11  . cloNIDine (CATAPRES) 0.1 MG tablet TAKE ONE TABLET AT BEDTIME 30 tablet 2  . cloNIDine (CATAPRES) 0.2 MG tablet Take 0.2 mg by mouth at bedtime.    . diclofenac sodium (VOLTAREN) 1 % GEL Apply to affected area twice daily as needed (Patient taking differently: Apply 2 g topically 2 (two) times daily as needed (PAIN). ) 100 g 5  . divalproex (DEPAKOTE) 500 MG DR tablet Take 3 tablets (1,500 mg total) by mouth at bedtime. 90 tablet 2  . DULoxetine (CYMBALTA) 60 MG capsule Take 1 capsule (60 mg total) by mouth daily. 90 capsule 0  . fluticasone (FLONASE) 50 MCG/ACT nasal spray Place 1 spray into both nostrils 2 (two) times daily as needed for allergies or rhinitis. 16 g 6  . levothyroxine (SYNTHROID, LEVOTHROID) 112 MCG tablet TAKE (1) TABLET DAILY BE- FORE BREAKFAST FOR THYROID 90 tablet 0  . lisinopril-hydrochlorothiazide (PRINZIDE,ZESTORETIC) 20-25 MG tablet Take 1 tablet by mouth daily. 30 tablet 5  . Multiple Vitamin (MULTIVITAMIN WITH MINERALS) TABS Take 1 tablet by mouth daily. For nutritional supplementation. 30 tablet 0  . ondansetron (ZOFRAN ODT) 4 MG disintegrating tablet Take 1 tablet (4 mg total) by mouth every 8 (eight) hours as needed for nausea or vomiting. 20 tablet 0  . pantoprazole (PROTONIX) 40 MG tablet Take 40 mg by mouth daily.    Marland Kitchen PROAIR HFA 108 (90 Base) MCG/ACT inhaler USE 2 PUFF EVERY 4 HOURS AS NEEDED FOR WHEEZING OR SHORTNESS OF BREATH 8.5 g 5  . risperiDONE (RISPERDAL) 1 MG tablet Take 1 tablet (1 mg total) by mouth daily. 90 tablet 0  . risperiDONE (RISPERDAL) 2 MG tablet Take 1 tablet (2 mg total) by mouth at bedtime. 69 tablet 2   No current facility-administered medications on file prior to visit.     Social History   Social History  . Marital status: Single    Spouse name: N/A  . Number of children: 2  . Years of education: N/A   Occupational History  . Disabled Unemployed    back problems   Social History Main Topics  . Smoking  status: Never Smoker  . Smokeless tobacco: Never Used  . Alcohol use No  . Drug use: No  . Sexual activity: No   Other Topics Concern  . Not on file   Social History Narrative   [name] is disabled and lives with her sister, Angelia. She has another sister with whom she has lived with in the past, and who is a Designer, jewellery, but not currently practicing.   She has two grown sons that she does not see often, as they live in other  states. She has 5 grand children.   [name] has a long history of mental illness including depression, PTSD, suicidal and homicidal ideation.   She has been obese most all of her life. Her weight has significantly impacted her QOL. She recently lost 20 lbs. By decreasing portion size & increasing proteins.     Family History  Problem Relation Age of Onset  . Hypertension Mother   . Bipolar disorder Mother   . Dementia Mother   . Depression Mother   . Heart attack Mother   . AAA (abdominal aortic aneurysm) Mother   . Coronary artery disease Father   . Alcohol abuse Father   . Hypertension Brother   . Coronary artery disease Brother   . Bipolar disorder Brother   . Depression Brother   . Depression Sister   . Paranoid behavior Sister   . Cancer Sister     breast  . Bipolar disorder Sister   . Depression Sister   . Hypertension Sister   . Cancer Son     thyroid  . Cancer Maternal Aunt     breast metastatized to brain  . Anesthesia problems Neg Hx   . Hypotension Neg Hx   . Malignant hyperthermia Neg Hx   . Pseudochol deficiency Neg Hx     BP 114/65   Pulse (!) 107   Temp 97.5 F (36.4 C)   Ht 5\' 9"  (1.753 m)   Wt (!) 388 lb (176 kg)   LMP 02/18/1995   BMI 57.30 kg/m      Objective:   Physical Exam  Constitutional: She is oriented to person, place, and time. She appears well-developed and well-nourished.  HENT:  Head: Normocephalic and atraumatic.  Eyes: Conjunctivae and EOM are normal. Pupils are equal, round, and reactive to  light.  Neck: Normal range of motion. Neck supple.  Cardiovascular: Normal rate, regular rhythm and intact distal pulses.   Pulmonary/Chest: Effort normal.  Abdominal: Soft.  Musculoskeletal: She exhibits tenderness (Lateral right ankle tender over peroneal area at lateral malleolus, no swelling, no redness, NV intact. Slight right limp.  chronic edema of both legs.  ).  Neurological: She is alert and oriented to person, place, and time. She displays normal reflexes. No cranial nerve deficit. She exhibits normal muscle tone. Coordination normal.  Skin: Skin is warm and dry.  Psychiatric: She has a normal mood and affect. Her behavior is normal. Judgment and thought content normal.  Vitals reviewed.  X-rays were done of the right ankle and foot, reported separately.       Assessment & Plan:   Encounter Diagnoses  Name Primary?  . Chronic pain of right ankle Yes  . Chronic foot pain, right    I have explained x-rays look good.  I am concerned about peroneal tendon tendinitis mild and chronic.  I will begin Naprosyn 500 po bid pc.  Return in two weeks.  She may need injection if not improved.  Call if any problem.  Precautions discussed.  Electronically Signed Sanjuana Kava, MD 4/4/201811:15 AM

## 2016-08-31 ENCOUNTER — Other Ambulatory Visit: Payer: Self-pay | Admitting: Internal Medicine

## 2016-08-31 ENCOUNTER — Telehealth: Payer: Self-pay | Admitting: Internal Medicine

## 2016-08-31 ENCOUNTER — Telehealth: Payer: Self-pay | Admitting: *Deleted

## 2016-08-31 DIAGNOSIS — B9629 Other Escherichia coli [E. coli] as the cause of diseases classified elsewhere: Secondary | ICD-10-CM

## 2016-08-31 DIAGNOSIS — N390 Urinary tract infection, site not specified: Secondary | ICD-10-CM

## 2016-08-31 DIAGNOSIS — Z1612 Extended spectrum beta lactamase (ESBL) resistance: Principal | ICD-10-CM

## 2016-08-31 DIAGNOSIS — N3090 Cystitis, unspecified without hematuria: Secondary | ICD-10-CM

## 2016-08-31 MED ORDER — FOSFOMYCIN TROMETHAMINE 3 G PO PACK: PACK | ORAL | 0 refills | Status: DC

## 2016-08-31 NOTE — Telephone Encounter (Signed)
Monurol approved through 05/28/17. [id number]. Pharmacy notified. Landis Gandy, RN

## 2016-08-31 NOTE — Telephone Encounter (Signed)
Her urine culture is growing the same ESBL producing Escherichia coli that was isolated in January. I called and told her that I would like to treat her with fosfomycin 3 g by mouth every 3 days for 3 doses. If her cystitis symptoms fail to resolve she will need treatment with an IV carbapenem.

## 2016-09-05 ENCOUNTER — Ambulatory Visit: Payer: Medicare Other | Admitting: Family

## 2016-09-14 ENCOUNTER — Ambulatory Visit: Payer: Self-pay | Admitting: Orthopaedic Surgery

## 2016-09-18 ENCOUNTER — Telehealth: Payer: Self-pay | Admitting: *Deleted

## 2016-09-18 NOTE — Telephone Encounter (Signed)
Called the patient and she is coming tomorrow 09/19/16 at 945.

## 2016-09-18 NOTE — Telephone Encounter (Signed)
Patient called and advised that when she was here for visit was told to do a number of things to make the UTI better. She reports she did everything she was told and it has not improved. She advised the doctor told her to call if it did not work. Advised he is out of the clinic but will let him know and call her back once he responds.

## 2016-09-18 NOTE — Telephone Encounter (Signed)
Please see if she is available to come in for a visit tomorrow morning at 9:45 AM.

## 2016-09-19 ENCOUNTER — Encounter: Payer: Self-pay | Admitting: Internal Medicine

## 2016-09-19 ENCOUNTER — Ambulatory Visit (INDEPENDENT_AMBULATORY_CARE_PROVIDER_SITE_OTHER): Payer: Medicare Other | Admitting: Internal Medicine

## 2016-09-19 DIAGNOSIS — R197 Diarrhea, unspecified: Secondary | ICD-10-CM

## 2016-09-19 DIAGNOSIS — N3090 Cystitis, unspecified without hematuria: Secondary | ICD-10-CM

## 2016-09-19 NOTE — Assessment & Plan Note (Addendum)
She is having about 4 loose to watery stools daily. I will check a C. difficile PCR.  Addendum 09/27/2016: Her C. difficile screen was negative.

## 2016-09-19 NOTE — Addendum Note (Signed)
Addended by: Dolan Amen D on: 09/19/2016 02:11 PM   Modules accepted: Orders

## 2016-09-19 NOTE — Assessment & Plan Note (Addendum)
She has persistent urinary symptoms suggesting cystitis. She has some new mid back pain but no unilateral flank tenderness to suggest pyelonephritis. I will check a repeat urine culture and call her with the results. If she is still growing the ESBL producing Escherichia coli I will have a PICC placed and start her on ertapenem.  Addendum 09/27/2016: Her urinalysis and urine culture were both negative. She does not need any antibiotic therapy at this time.

## 2016-09-19 NOTE — Progress Notes (Addendum)
[facility]  Patient Active Problem List   Diagnosis Date Noted  . Diarrhea 09/19/2016    Priority: High  . Cystitis 04/01/2014    Priority: High  . Severe obesity (BMI >= 40) ([place]) 07/05/2015  . Hyperlipidemia LDL goal <130 07/05/2015  . MDD (major depressive disorder), single episode, severe with psychotic features ([place]) 01/21/2015  . 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 Bilateral knee pain 01/07/2014  . Scar condition and fibrosis of skin 12/11/2013  . Essential hypertension, benign 07/01/2013  . Asthma with acute exacerbation 11/04/2012  . OSA on CPAP 11/04/2012  . Back pain, chronic 08/27/2012  . Vitamin D insufficiency 04/04/2012  . Insomnia due to mental disorder 04/04/2012  . Anxiety 04/04/2012  . OCD (obsessive compulsive disorder) 04/04/2012  . PTSD (post-traumatic stress disorder) 06/28/2011  . Coronary artery disease 08/26/2010  . FATTY LIVER DISEASE 03/29/2009  . GERD 03/01/2009  . Morbid obesity ([place]) 02/15/2009  . HYPOTHYROIDISM 02/12/2009    Patient's Medications  New Prescriptions   No medications on file  Previous Medications   ALBUTEROL (PROVENTIL) (2.5 MG/3ML) 0.083% NEBULIZER SOLUTION    Take 3 mLs (2.5 mg total) by nebulization every 4 (four) hours as needed for wheezing or shortness of breath.   ASPIRIN 81 MG CHEWABLE TABLET    Chew 81 mg by mouth daily.   ATORVASTATIN (LIPITOR) 20 MG TABLET    TAKE 1 TABLET DAILY   BREO ELLIPTA 100-25 MCG/INH AEPB    INHALE 1 PUFF INTO THE LUNGS DAILY   CELECOXIB (CELEBREX) 200 MG CAPSULE    Take 1 capsule by mouth 2 (two) times daily.   CETIRIZINE (ZYRTEC) 10 MG TABLET    TAKE 1 TABLET ONCE DAILY   CLONIDINE (CATAPRES) 0.1 MG TABLET    TAKE ONE TABLET AT BEDTIME   CLONIDINE (CATAPRES) 0.2 MG TABLET    Take 0.2 mg by mouth at bedtime.   DICLOFENAC SODIUM (VOLTAREN) 1 % GEL    Apply to affected area twice daily as needed   DIVALPROEX (DEPAKOTE) 500 MG DR TABLET    Take 3  tablets (1,500 mg total) by mouth at bedtime.   DULOXETINE (CYMBALTA) 60 MG CAPSULE    Take 1 capsule (60 mg total) by mouth daily.   FLUTICASONE (FLONASE) 50 MCG/ACT NASAL SPRAY    Place 1 spray into both nostrils 2 (two) times daily as needed for allergies or rhinitis.   FOSFOMYCIN (MONUROL) 3 G PACK    Take 3 g by mouth every 3 days for 3 doses.   LEVOTHYROXINE (SYNTHROID, LEVOTHROID) 112 MCG TABLET    TAKE (1) TABLET DAILY BE- FORE BREAKFAST FOR THYROID   LISINOPRIL-HYDROCHLOROTHIAZIDE (PRINZIDE,ZESTORETIC) 20-25 MG TABLET    Take 1 tablet by mouth daily.   MULTIPLE VITAMIN (MULTIVITAMIN WITH MINERALS) TABS    Take 1 tablet by mouth daily. For nutritional supplementation.   ONDANSETRON (ZOFRAN ODT) 4 MG DISINTEGRATING TABLET    Take 1 tablet (4 mg total) by mouth every 8 (eight) hours as needed for nausea or vomiting.   PANTOPRAZOLE (PROTONIX) 40 MG TABLET    Take 40 mg by mouth daily.   PROAIR HFA 108 (90 BASE) MCG/ACT INHALER    USE 2 PUFF EVERY 4 HOURS AS NEEDED FOR WHEEZING OR SHORTNESS OF BREATH   RISPERIDONE (RISPERDAL) 1 MG TABLET    Take 1 tablet (1 mg total) by mouth daily.   RISPERIDONE (RISPERDAL) 2 MG TABLET  Take 1 tablet (2 mg total) by mouth at bedtime.  Modified Medications   No medications on file  Discontinued Medications   NAPROXEN (NAPROSYN) 500 MG TABLET    Take 1 tablet (500 mg total) by mouth 2 (two) times daily with a meal.    Subjective: Ms. Luhmann is seen on a work in basis. She had some transient improvement in her dysuria while taking fosfomycin recently but her symptoms recurred within 3 days after taking her last dose. She is bothered by dysuria, suprapubic tenderness, frequency and nocturia. She is having night sweats. She has anorexia and some weight loss. She has also been bothered by recent nausea and diarrhea.  Review of Systems: Review of Systems  Constitutional: Positive for diaphoresis, malaise/fatigue and weight loss. Negative for chills and fever.   Gastrointestinal: Positive for abdominal pain, diarrhea and nausea. Negative for vomiting.  Genitourinary: Positive for dysuria and frequency. Negative for flank pain.    Past Medical History:  Diagnosis Date  . Allergy   . Anxiety   . Arthritis   . Asthma   . Bipolar 1 disorder ([place])   . CAD (coronary artery disease)   . Cellulitis   . CHF (congestive heart failure) ([facility])    diastolic dysfunction  . Chronic back pain   . Chronic headaches   . Complication of anesthesia    States she typically gets sick s/p anesthesia  . Contusion of sacrum   . COPD (chronic obstructive pulmonary disease) ([place])   . Depression   . Dyspnea    PFT 03/05/09 FEV1 2.77(98%), FVC 3.25(86%), FEV1% 85, TLC 5.88(99%), DLCO 60% ,  Methacholine challenge 03/16/09 normal ,  CT chest 03/12/09 no pulmonary disease  . Fungal infection   . GERD (gastroesophageal reflux disease)   . History of colonoscopy 10/17/2002   by Dr Rehman-> distal non-specific proctitis, small ext hemorrhoids,   . HTN (hypertension)   . Hyperlipidemia   . Hyperthyroidism   . Hypothyroidism    States she only has hyperthyroidism  . Migraine headache   . Morbid obesity with body mass index of 50.0-59.9 in adult [facility]) JAN 2011 370 LBS   2004 311 BMI 45.9  . Myocardial infarction ([place])    NOV 1997  . OSA on CPAP    she had been on 2L O2 at night but that was stopped  . PONV (postoperative nausea and vomiting)   . PTSD (post-traumatic stress disorder)   . Suicidal ideation   . Urine incontinence   . Vitamin D deficiency     Social History  Substance Use Topics  . Smoking status: Never Smoker  . Smokeless tobacco: Never Used  . Alcohol use No    Family History  Problem Relation Age of Onset  . Hypertension Mother   . Bipolar disorder Mother   . Dementia Mother   . Depression Mother   . Heart attack Mother   . AAA (abdominal aortic aneurysm) Mother   . Coronary artery disease Father   . Alcohol abuse Father   .  Hypertension Brother   . Coronary artery disease Brother   . Bipolar disorder Brother   . Depression Brother   . Depression Sister   . Paranoid behavior Sister   . Cancer Sister     breast  . Bipolar disorder Sister   . Depression Sister   . Hypertension Sister   . Cancer Son     thyroid  . Cancer Maternal Aunt     breast  metastatized to brain  . Anesthesia problems Neg Hx   . Hypotension Neg Hx   . Malignant hyperthermia Neg Hx   . Pseudochol deficiency Neg Hx     Allergies  Allergen Reactions  . Ativan [Lorazepam] Other (See Comments)    Delirium  . Haldol [Haloperidol] Other (See Comments)    Hallucinating   . Naproxen Nausea And Vomiting    Objective: Vitals:   09/19/16 0943  BP: 135/81  Pulse: 98  Temp: 97.9 F (36.6 C)  TempSrc: Oral  SpO2: 96%  Weight: (!) 390 lb (176.9 kg)  Height: 5\' 9"  (1.753 m)   Body mass index is 57.59 kg/m.  Physical Exam  Constitutional: She is oriented to person, place, and time.  She has lost about 6 pounds.  Cardiovascular: Normal rate and regular rhythm.   No murmur heard. Pulmonary/Chest: Effort normal and breath sounds normal.  Abdominal: Soft. There is tenderness.  Mild suprapubic tenderness with palpation.  Neurological: She is alert and oriented to person, place, and time.  Skin: No rash noted.  Psychiatric: Mood and affect normal.    Lab Results    Problem List Items Addressed This Visit      High   Cystitis    She has persistent urinary symptoms suggesting cystitis. She has some new mid back pain but no unilateral flank tenderness to suggest pyelonephritis. I will check a repeat urine culture and call her with the results. If she is still growing the ESBL producing Escherichia coli I will have a PICC placed and start her on ertapenem.  Addendum 09/27/2016: Her urinalysis and urine culture were both negative. She does not need any antibiotic therapy at this time.      Relevant Orders   Urinalysis, Routine  w reflex microscopic (Completed)   Urine Culture (Completed)   Diarrhea    She is having about 4 loose to watery stools daily. I will check a C. difficile PCR.  Addendum 09/27/2016: Her C. difficile screen was negative.      Relevant Orders   Clostridium Difficile by PCR (Completed)       Michel Bickers, MD [facility] [phone] pager   [phone] cell 09/27/2016, 11:11 AM

## 2016-09-20 ENCOUNTER — Other Ambulatory Visit: Payer: Medicare Other

## 2016-09-20 DIAGNOSIS — N3090 Cystitis, unspecified without hematuria: Secondary | ICD-10-CM | POA: Diagnosis not present

## 2016-09-20 LAB — CLOSTRIDIUM DIFFICILE BY PCR: Toxigenic C. Difficile by PCR: NOT DETECTED

## 2016-09-20 NOTE — Addendum Note (Signed)
Addended by: Lorne Skeens D on: 09/20/2016 02:49 PM   Modules accepted: Orders

## 2016-09-21 ENCOUNTER — Telehealth: Payer: Self-pay

## 2016-09-21 LAB — URINE CULTURE

## 2016-09-21 LAB — URINALYSIS, ROUTINE W REFLEX MICROSCOPIC
BILIRUBIN URINE: NEGATIVE
GLUCOSE, UA: NEGATIVE
Hgb urine dipstick: NEGATIVE
Ketones, ur: NEGATIVE
Leukocytes, UA: NEGATIVE
Nitrite: NEGATIVE
PH: 7 (ref 5.0–8.0)
Protein, ur: NEGATIVE
SPECIFIC GRAVITY, URINE: 1.023 (ref 1.001–1.035)

## 2016-09-21 NOTE — Telephone Encounter (Signed)
Patient is calling for lab results.  She needs to know if she is -diff positive.   Please advise .   Laverle Patter, RN

## 2016-09-22 ENCOUNTER — Other Ambulatory Visit: Payer: Self-pay | Admitting: Internal Medicine

## 2016-09-22 ENCOUNTER — Telehealth: Payer: Self-pay | Admitting: *Deleted

## 2016-09-22 DIAGNOSIS — N3090 Cystitis, unspecified without hematuria: Secondary | ICD-10-CM

## 2016-09-22 NOTE — Telephone Encounter (Signed)
Please let her know that her C. difficile test was negative. Unfortunately her urine culture was a contaminated specimen. Please ask her if she will be able to come back in and give [name] a clean catch specimen for culture.

## 2016-09-22 NOTE — Telephone Encounter (Signed)
Patient notified and she will come in on Monday. Added to schedule and lab ordered.

## 2016-09-22 NOTE — Telephone Encounter (Signed)
error 

## 2016-09-25 ENCOUNTER — Other Ambulatory Visit: Payer: Medicare Other

## 2016-09-25 DIAGNOSIS — N3090 Cystitis, unspecified without hematuria: Secondary | ICD-10-CM | POA: Diagnosis not present

## 2016-09-26 LAB — URINE CULTURE

## 2016-09-28 ENCOUNTER — Other Ambulatory Visit: Payer: Self-pay | Admitting: Family Medicine

## 2016-09-29 ENCOUNTER — Other Ambulatory Visit: Payer: Self-pay | Admitting: Family Medicine

## 2016-09-29 DIAGNOSIS — E785 Hyperlipidemia, unspecified: Secondary | ICD-10-CM

## 2016-10-03 ENCOUNTER — Ambulatory Visit: Payer: Medicare Other | Admitting: Orthopaedic Surgery

## 2016-10-03 ENCOUNTER — Other Ambulatory Visit: Payer: Self-pay | Admitting: Family Medicine

## 2016-10-12 ENCOUNTER — Ambulatory Visit: Payer: Self-pay | Admitting: Internal Medicine

## 2016-10-12 ENCOUNTER — Other Ambulatory Visit (HOSPITAL_COMMUNITY): Payer: Self-pay | Admitting: Psychiatry

## 2016-10-12 DIAGNOSIS — F3131 Bipolar disorder, current episode depressed, mild: Secondary | ICD-10-CM

## 2016-10-17 ENCOUNTER — Ambulatory Visit (HOSPITAL_COMMUNITY): Payer: Self-pay | Admitting: Psychiatry

## 2016-10-19 ENCOUNTER — Other Ambulatory Visit (HOSPITAL_COMMUNITY): Payer: Self-pay

## 2016-10-19 DIAGNOSIS — F3131 Bipolar disorder, current episode depressed, mild: Secondary | ICD-10-CM

## 2016-10-19 MED ORDER — RISPERIDONE 1 MG PO TABS: 1.0000 mg | ORAL_TABLET | Freq: Every day | ORAL | 0 refills | Status: DC

## 2016-10-20 ENCOUNTER — Other Ambulatory Visit (HOSPITAL_COMMUNITY): Payer: Self-pay

## 2016-10-20 DIAGNOSIS — F3131 Bipolar disorder, current episode depressed, mild: Secondary | ICD-10-CM

## 2016-10-20 MED ORDER — RISPERIDONE 1 MG PO TABS: 1.0000 mg | ORAL_TABLET | Freq: Every day | ORAL | 0 refills | Status: DC

## 2016-10-27 ENCOUNTER — Other Ambulatory Visit: Payer: Self-pay | Admitting: Family Medicine

## 2016-10-27 ENCOUNTER — Other Ambulatory Visit (HOSPITAL_COMMUNITY): Payer: Self-pay | Admitting: Psychiatry

## 2016-10-27 DIAGNOSIS — F3160 Bipolar disorder, current episode mixed, unspecified: Secondary | ICD-10-CM

## 2016-10-27 DIAGNOSIS — F3131 Bipolar disorder, current episode depressed, mild: Secondary | ICD-10-CM

## 2016-10-31 ENCOUNTER — Other Ambulatory Visit: Payer: Self-pay | Admitting: Family Medicine

## 2016-10-31 DIAGNOSIS — E039 Hypothyroidism, unspecified: Secondary | ICD-10-CM

## 2016-11-02 ENCOUNTER — Telehealth (HOSPITAL_COMMUNITY): Payer: Self-pay

## 2016-11-02 NOTE — Telephone Encounter (Signed)
Medication refill request - Fax from [facility] for a refill of patient's prescribed Divalproex as was last provided 07/20/16 + 2 refills. Pt. canceled 10/17/16 and is rescheduled for 12/11/16.

## 2016-11-03 ENCOUNTER — Other Ambulatory Visit (HOSPITAL_COMMUNITY): Payer: Self-pay | Admitting: Psychiatry

## 2016-11-08 ENCOUNTER — Ambulatory Visit (INDEPENDENT_AMBULATORY_CARE_PROVIDER_SITE_OTHER): Payer: Medicare Other | Admitting: *Deleted

## 2016-11-08 ENCOUNTER — Other Ambulatory Visit: Payer: Self-pay | Admitting: Family Medicine

## 2016-11-08 VITALS — BP 110/74 | HR 104 | Ht 68.0 in | Wt 387.0 lb

## 2016-11-08 DIAGNOSIS — Z0000 Encounter for general adult medical examination without abnormal findings: Secondary | ICD-10-CM

## 2016-11-08 DIAGNOSIS — E2839 Other primary ovarian failure: Secondary | ICD-10-CM

## 2016-11-08 DIAGNOSIS — N3090 Cystitis, unspecified without hematuria: Secondary | ICD-10-CM

## 2016-11-08 DIAGNOSIS — Z01419 Encounter for gynecological examination (general) (routine) without abnormal findings: Secondary | ICD-10-CM

## 2016-11-08 NOTE — Patient Instructions (Addendum)
You have been scheduled for a mammogram and dexa scan on November 30, 2016 at 10 am at the [facility].   Per Dr. Warrick Parisian recommends you seeing a urologist for your recurrent urinary tract infection and a gynecologist for your gynecology exam.  I have entered referrals for both of these appointments.   Please review the information given on Advanced Directives, and if you choose to put them in place please bring [name] a copy for your medical record.  Please further into joining [employer] at the [facility] and exercising 3 days per week working up to 30 minutes  Try to have 3 meals per day including mostly lean proteins, vegetables, fruits, and whole grains.    Thank you for seeing [name] for your Annual Wellness Visit today!  Fall Prevention in the Home Falls can cause injuries and can affect people from all age groups. There are many simple things that you can do to make your home safe and to help prevent falls. What can I do on the outside of my home?  Regularly repair the edges of walkways and driveways and fix any cracks.  Remove high doorway thresholds.  Trim any shrubbery on the main path into your home.  Use bright outdoor lighting.  Clear walkways of debris and clutter, including tools and rocks.  Regularly check that handrails are securely fastened and in good repair. Both sides of any steps should have handrails.  Install guardrails along the edges of any raised decks or porches.  Have leaves, snow, and ice cleared regularly.  Use sand or salt on walkways during winter months.  In the garage, clean up any spills right away, including grease or oil spills. What can I do in the bathroom?  Use night lights.  Install grab bars by the toilet and in the tub and shower. Do not use towel bars as grab bars.  Use non-skid mats or decals on the floor of the tub or shower.  If you need to sit down while you are in the shower, use a plastic, non-slip  stool.  Keep the floor dry. Immediately clean up any water that spills on the floor.  Remove soap buildup in the tub or shower on a regular basis.  Attach bath mats securely with double-sided non-slip rug tape.  Remove throw rugs and other tripping hazards from the floor. What can I do in the bedroom?  Use night lights.  Make sure that a bedside light is easy to reach.  Do not use oversized bedding that drapes onto the floor.  Have a firm chair that has side arms to use for getting dressed.  Remove throw rugs and other tripping hazards from the floor. What can I do in the kitchen?  Clean up any spills right away.  Avoid walking on wet floors.  Place frequently used items in easy-to-reach places.  If you need to reach for something above you, use a sturdy step stool that has a grab bar.  Keep electrical cables out of the way.  Do not use floor polish or wax that makes floors slippery. If you have to use wax, make sure that it is non-skid floor wax.  Remove throw rugs and other tripping hazards from the floor. What can I do in the stairways?  Do not leave any items on the stairs.  Make sure that there are handrails on both sides of the stairs. Fix handrails that are broken or loose. Make sure that handrails are  as long as the stairways.  Check any carpeting to make sure that it is firmly attached to the stairs. Fix any carpet that is loose or worn.  Avoid having throw rugs at the top or bottom of stairways, or secure the rugs with carpet tape to prevent them from moving.  Make sure that you have a light switch at the top of the stairs and the bottom of the stairs. If you do not have them, have them installed. What are some other fall prevention tips?  Wear closed-toe shoes that fit well and support your feet. Wear shoes that have rubber soles or low heels.  When you use a stepladder, make sure that it is completely opened and that the sides are firmly locked. Have  someone hold the ladder while you are using it. Do not climb a closed stepladder.  Add color or contrast paint or tape to grab bars and handrails in your home. Place contrasting color strips on the first and last steps.  Use mobility aids as needed, such as canes, walkers, scooters, and crutches.  Turn on lights if it is dark. Replace any light bulbs that burn out.  Set up furniture so that there are clear paths. Keep the furniture in the same spot.  Fix any uneven floor surfaces.  Choose a carpet design that does not hide the edge of steps of a stairway.  Be aware of any and all pets.  Review your medicines with your healthcare provider. Some medicines can cause dizziness or changes in blood pressure, which increase your risk of falling. Talk with your health care provider about other ways that you can decrease your risk of falls. This may include working with a physical therapist or trainer to improve your strength, balance, and endurance. This information is not intended to replace advice given to you by your health care provider. Make sure you discuss any questions you have with your health care provider. Document Released: 05/05/2002 Document Revised: 10/12/2015 Document Reviewed: 06/19/2014 Elsevier Interactive Patient Education  2017 [place] Years, Female Preventive care refers to lifestyle choices and visits with your health care provider that can promote health and wellness. What does preventive care include?  A yearly physical exam. This is also called an annual well check.  Dental exams once or twice a year.  Routine eye exams. Ask your health care provider how often you should have your eyes checked.  Personal lifestyle choices, including: ? Daily care of your teeth and gums. ? Regular physical activity. ? Eating a healthy diet. ? Avoiding tobacco and drug use. ? Limiting alcohol use. ? Practicing safe sex. ? Taking low-dose aspirin  daily starting at age 59. ? Taking vitamin and mineral supplements as recommended by your health care provider. What happens during an annual well check? The services and screenings done by your health care provider during your annual well check will depend on your age, overall health, lifestyle risk factors, and family history of disease. Counseling Your health care provider may ask you questions about your:  Alcohol use.  Tobacco use.  Drug use.  Emotional well-being.  Home and relationship well-being.  Sexual activity.  Eating habits.  Work and work Statistician.  Method of birth control.  Menstrual cycle.  Pregnancy history.  Screening You may have the following tests or measurements:  Height, weight, and BMI.  Blood pressure.  Lipid and cholesterol levels. These may be checked every 5 years, or more frequently  if you are over 25 years old.  Skin check.  Lung cancer screening. You may have this screening every year starting at age 2 if you have a 30-pack-year history of smoking and currently smoke or have quit within the past 15 years.  Fecal occult blood test (FOBT) of the stool. You may have this test every year starting at age 31.  Flexible sigmoidoscopy or colonoscopy. You may have a sigmoidoscopy every 5 years or a colonoscopy every 10 years starting at age 43.  Hepatitis C blood test.  Hepatitis B blood test.  Sexually transmitted disease (STD) testing.  Diabetes screening. This is done by checking your blood sugar (glucose) after you have not eaten for a while (fasting). You may have this done every 1-3 years.  Mammogram. This may be done every 1-2 years. Talk to your health care provider about when you should start having regular mammograms. This may depend on whether you have a family history of breast cancer.  BRCA-related cancer screening. This may be done if you have a family history of breast, ovarian, tubal, or peritoneal cancers.  Pelvic  exam and Pap test. This may be done every 3 years starting at age 55. Starting at age 46, this may be done every 5 years if you have a Pap test in combination with an HPV test.  Bone density scan. This is done to screen for osteoporosis. You may have this scan if you are at high risk for osteoporosis.  Discuss your test results, treatment options, and if necessary, the need for more tests with your health care provider. Vaccines Your health care provider may recommend certain vaccines, such as:  Influenza vaccine. This is recommended every year.  Tetanus, diphtheria, and acellular pertussis (Tdap, Td) vaccine. You may need a Td booster every 10 years.  Varicella vaccine. You may need this if you have not been vaccinated.  Zoster vaccine. You may need this after age 5.  Measles, mumps, and rubella (MMR) vaccine. You may need at least one dose of MMR if you were born in 1957 or later. You may also need a second dose.  Pneumococcal 13-valent conjugate (PCV13) vaccine. You may need this if you have certain conditions and were not previously vaccinated.  Pneumococcal polysaccharide (PPSV23) vaccine. You may need one or two doses if you smoke cigarettes or if you have certain conditions.  Meningococcal vaccine. You may need this if you have certain conditions.  Hepatitis A vaccine. You may need this if you have certain conditions or if you travel or work in places where you may be exposed to hepatitis A.  Hepatitis B vaccine. You may need this if you have certain conditions or if you travel or work in places where you may be exposed to hepatitis B.  Haemophilus influenzae type b (Hib) vaccine. You may need this if you have certain conditions.  Talk to your health care provider about which screenings and vaccines you need and how often you need them. This information is not intended to replace advice given to you by your health care provider. Make sure you discuss any questions you have with  your health care provider. Document Released: 06/11/2015 Document Revised: 02/02/2016 Document Reviewed: 03/16/2015 Elsevier Interactive Patient Education  2017 [employer].

## 2016-11-08 NOTE — Progress Notes (Signed)
Subjective:   Allison Sharp is a 61 y.o. female who presents for Medicare Annual (Subsequent) preventive examination.  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  She enjoys reading, shopping, and traveling with her sister.  She lives with her sister whom she is very close with.  She has two sons, eight grandchildren and 2 great grandchildren that all live out of state.  She and her sister go walking at the park twice per week for about 20 minutes.  Her sister has an indoor dog, but she does not feel the dog causes a tripping hazard for her.  She feels that her health is worse this year than last year due to  recurrent antibiotic resistant cystitis.  She has had one hospitalization this year at a psychiatric hospital.    Review of Systems:   Patient states she is having her normal chronic back pain today.  All other systems negative          Objective:     Vitals: BP 110/74   Pulse (!) 104   Ht 5\' 8"  (1.727 m)   Wt (!) 387 lb (175.5 kg)   LMP 02/18/1995   BMI 58.84 kg/m   Body mass index is 58.84 kg/m.   Tobacco History  Smoking Status  . Never Smoker  Smokeless Tobacco  . Never Used       Past Medical History:  Diagnosis Date  . Allergy   . Anxiety   . Arthritis   . Asthma   . Bipolar 1 disorder ([place])   . CAD (coronary artery disease)   . Cellulitis   . CHF (congestive heart failure) (HCC)    diastolic dysfunction  . Chronic back pain   . Chronic headaches   . Complication of anesthesia    States she typically gets sick s/p anesthesia  . Contusion of sacrum   . COPD (chronic obstructive pulmonary disease) ([place])   . Depression   . Dyspnea    PFT 03/05/09 FEV1 2.77(98%), FVC 3.25(86%), FEV1% 85, TLC 5.88(99%), DLCO 60% ,  Methacholine challenge 03/16/09 normal ,  CT chest 03/12/09 no pulmonary disease  . Fungal infection   . GERD (gastroesophageal reflux disease)   . History of  colonoscopy 10/17/2002   by Dr Rehman-> distal non-specific proctitis, small ext hemorrhoids,   . HTN (hypertension)   . Hyperlipidemia   . Hyperthyroidism   . Hypothyroidism    States she only has hyperthyroidism  . Migraine headache   . Morbid obesity with body mass index of 50.0-59.9 in adult [facility]) JAN 2011 370 LBS   2004 311 BMI 45.9  . Myocardial infarction ([place])    NOV 1997  . OSA on CPAP    she had been on 2L O2 at night but that was stopped  . PONV (postoperative nausea and vomiting)   . PTSD (post-traumatic stress disorder)   . Suicidal ideation   . Urine incontinence   . Vitamin D deficiency    Past Surgical History:  Procedure Laterality Date  . ABDOMINAL HYSTERECTOMY     sept 1996  . APPENDECTOMY    . BACK SURGERY  2008  . CARDIAC CATHETERIZATION     nov 1997  . CHOLECYSTECTOMY    . COLONOSCOPY  10/17/2002    Distal proctitis, small external hemorrhoids, otherwise/  normal colonoscopy. Suspect rectal bleeding secondary to hemorrhoids  . ESOPHAGOGASTRODUODENOSCOPY  03/18/09   fundic gland polyps/mild gastritis  . HERNIA REPAIR  1978  . JOINT REPLACEMENT     bil knee replacement  . KNEE ARTHROSCOPY    . MULTIPLE EXTRACTIONS WITH ALVEOLOPLASTY N/A 08/16/2015   Procedure: EXTRACTION OF TEETH THREE, SIX, EIGHT, NINE, ELEVEN, FOURTEEN, FIFTEEN, TWENTY-EIGHT WITH ALVEOLOPLASTY;  Surgeon: Diona Browner, DDS;  Location: [place];  Service: Oral Surgery;  Laterality: N/A;  . TONSILLECTOMY    . TOTAL VAGINAL HYSTERECTOMY    . TUBAL LIGATION     Family History  Problem Relation Age of Onset  . Hypertension Mother   . Bipolar disorder Mother   . Dementia Mother   . Depression Mother   . Heart attack Mother   . AAA (abdominal aortic aneurysm) Mother   . Coronary artery disease Father   . Alcohol abuse Father   . Hypertension Brother   . Coronary artery disease Brother   . Bipolar disorder Brother   . Depression Brother   . Depression Sister   . Paranoid behavior  Sister   . Cancer Sister        breast  . Bipolar disorder Sister   . Depression Sister   . Hypertension Sister   . Cancer Son        thyroid  . Cancer Maternal Aunt        breast metastatized to brain  . Anesthesia problems Neg Hx   . Hypotension Neg Hx   . Malignant hyperthermia Neg Hx   . Pseudochol deficiency Neg Hx    History  Sexual Activity  . Sexual activity: No    Outpatient Encounter Prescriptions as of 11/08/2016  Medication Sig  . albuterol (PROVENTIL) (2.5 MG/3ML) 0.083% nebulizer solution Take 3 mLs (2.5 mg total) by nebulization every 4 (four) hours as needed for wheezing or shortness of breath.  Marland Kitchen atorvastatin (LIPITOR) 20 MG tablet TAKE 1 TABLET DAILY  . BREO ELLIPTA 100-25 MCG/INH AEPB INHALE 1 PUFF INTO THE LUNGS DAILY  . celecoxib (CELEBREX) 200 MG capsule Take 1 capsule by mouth 2 (two) times daily.  . cetirizine (ZYRTEC) 10 MG tablet TAKE 1 TABLET ONCE DAILY  . cloNIDine (CATAPRES) 0.1 MG tablet TAKE ONE TABLET AT BEDTIME  . cloNIDine (CATAPRES) 0.2 MG tablet Take 0.2 mg by mouth at bedtime.  . diclofenac sodium (VOLTAREN) 1 % GEL Apply to affected area twice daily as needed (Patient taking differently: Apply 2 g topically 2 (two) times daily as needed (PAIN). )  . divalproex (DEPAKOTE) 500 MG DR tablet Take 3 tablets (1,500 mg total) by mouth at bedtime.  . DULoxetine (CYMBALTA) 60 MG capsule Take 1 capsule (60 mg total) by mouth daily.  . fluticasone (FLONASE) 50 MCG/ACT nasal spray Place 1 spray into both nostrils 2 (two) times daily as needed for allergies or rhinitis.  Marland Kitchen lisinopril-hydrochlorothiazide (PRINZIDE,ZESTORETIC) 20-25 MG tablet Take 1 tablet by mouth daily.  . Multiple Vitamin (MULTIVITAMIN WITH MINERALS) TABS Take 1 tablet by mouth daily. For nutritional supplementation.  Marland Kitchen omeprazole (PRILOSEC) 20 MG capsule TAKE (1) CAPSULE DAILY  . ondansetron (ZOFRAN ODT) 4 MG disintegrating tablet Take 1 tablet (4 mg total) by mouth every 8 (eight) hours  as needed for nausea or vomiting.  Marland Kitchen PROAIR HFA 108 (90 Base) MCG/ACT inhaler USE 2 PUFF EVERY 4 HOURS AS NEEDED FOR WHEEZING OR SHORTNESS OF BREATH  . risperiDONE (RISPERDAL) 1 MG tablet Take 1 tablet (1 mg total) by mouth daily.  . risperiDONE (RISPERDAL) 2 MG tablet Take  1 tablet (2 mg total) by mouth at bedtime.  Marland Kitchen aspirin 81 MG chewable tablet Chew 81 mg by mouth daily.  . fosfomycin (MONUROL) 3 g PACK Take 3 g by mouth every 3 days for 3 doses. (Patient not taking: Reported on 09/19/2016)  . levothyroxine (SYNTHROID, LEVOTHROID) 112 MCG tablet TAKE (1) TABLET DAILY BE- FORE BREAKFAST FOR THYROID  . pantoprazole (PROTONIX) 40 MG tablet Take 40 mg by mouth daily.   No facility-administered encounter medications on file as of 11/08/2016.     Activities of Daily Living In your present state of health, do you have any difficulty performing the following activities: 11/08/2016 08/28/2016  Hearing? N N  Vision? N N  Difficulty concentrating or making decisions? Y N  Walking or climbing stairs? Y Y  Dressing or bathing? N N  Doing errands, shopping? N N  Some recent data might be hidden    Patient Care Team: Dettinger, Fransisca Kaufmann, MD as PCP - General (Family Medicine) Arfeen, Arlyce Harman, MD (Psychiatry) Danie Binder, MD (Gastroenterology) Fay Records, MD as Consulting Physician (Cardiology)    Assessment:     Exercise Activities and Dietary recommendations Current Exercise Habits: Home exercise routine, Type of exercise: walking, Time (Minutes): 20, Frequency (Times/Week): 2, Weekly Exercise (Minutes/Week): 40, Intensity: Mild, Exercise limited by: orthopedic condition(s)  Goals    . Exercise 3x per week (30 min per time)          Patient is interested in joining [employer] at [employer].  Encouraged her to join this exercise group, and try to exercise for 30 minutes at least 3 times per week    . Have 3 meals a day          Include mostly lean proteins, vegetables, whole grains  and fruits in moderation    . Weight < 200 lb (90.719 kg)      Fall Risk Fall Risk  11/08/2016 08/28/2016 07/06/2016 03/15/2016 11/08/2015  Falls in the past year? Yes Yes No No No  Number falls in past yr: 2 or more 2 or more - - -  Injury with Fall? No No - - -  Risk Factor Category  High Fall Risk High Fall Risk - - -  Risk for fall due to : Impaired balance/gait;Impaired mobility History of fall(s);Impaired balance/gait - - -  Follow up Falls prevention discussed Falls evaluation completed;Education provided;Falls prevention discussed - - -   Depression Screen PHQ 2/9 Scores 11/08/2016 08/28/2016 08/21/2016 07/06/2016  PHQ - 2 Score 1 2 4 6   PHQ- 9 Score - 12 14 22      Cognitive Function MMSE - Mini Mental State Exam 11/08/2016 06/28/2015  Orientation to time 5 5  Orientation to Place 5 5  Registration 3 3  Attention/ Calculation 5 5  Recall 3 2  Language- name 2 objects 2 2  Language- repeat 1 1  Language- follow 3 step command 3 3  Language- read & follow direction 1 1  Write a sentence 1 1  Copy design 1 1  Total score 30 29        Immunization History  Administered Date(s) Administered  . Influenza Split 04/09/2013  . Influenza,inj,Quad PF,36+ Mos 03/03/2014, 03/01/2015, 03/01/2015  . Influenza-Unspecified 03/01/2016  . Pneumococcal Conjugate-13 05/30/2011  . Tdap 09/30/2012  . Zoster 07/02/2015   Screening Tests Health Maintenance  Topic Date Due  . PAP SMEAR  07/01/2016  . INFLUENZA VACCINE  12/27/2016  . MAMMOGRAM  02/04/2017  .  COLONOSCOPY  05/29/2017  . TETANUS/TDAP  10/01/2022  . Hepatitis C Screening  Completed  . HIV Screening  Completed    Gynecology referral entered for pap smear Mammogram and dexa scan scheduled       Plan:      Keep mammogram and dexa scan appointments on November 30, 2016 at 10 am at the [place] [facility].   Urology and gynecology referrals entered per Dr. Warrick Parisian.   Review the information given on Advanced  Directives  Join [employer] at the [facility] and exercise 3 days per week working up to 30 minutes  Have 3 meals per day including mostly lean proteins, vegetables, fruits, and whole grains.    Schedule eye exam   I have personally reviewed and noted the following in the patient's chart:   . Medical and social history . Use of alcohol, tobacco or illicit drugs  . Current medications and supplements . Functional ability and status . Nutritional status . Physical activity . Advanced directives . List of other physicians . Hospitalizations, surgeries, and ER visits in previous 12 months . Vitals . Screenings to include cognitive, depression, and falls . Referrals and appointments  In addition, I have reviewed and discussed with patient certain preventive protocols, quality metrics, and best practice recommendations. A written personalized care plan for preventive services as well as general preventive health recommendations were provided to patient.     WYATT, [name] M, RN  11/08/2016  I have reviewed and agree with the above AWV documentation.   [name], MD [facility] 11/08/2016, 9:25 PM

## 2016-11-09 ENCOUNTER — Telehealth: Payer: Self-pay | Admitting: Obstetrics & Gynecology

## 2016-11-09 NOTE — Telephone Encounter (Signed)
Called and left a message for patient to call back to schedule a new patient doctor referral for AEX. °

## 2016-11-10 DIAGNOSIS — J449 Chronic obstructive pulmonary disease, unspecified: Secondary | ICD-10-CM | POA: Diagnosis not present

## 2016-11-10 DIAGNOSIS — M5137 Other intervertebral disc degeneration, lumbosacral region: Secondary | ICD-10-CM | POA: Diagnosis not present

## 2016-11-10 DIAGNOSIS — Z886 Allergy status to analgesic agent status: Secondary | ICD-10-CM | POA: Diagnosis not present

## 2016-11-10 DIAGNOSIS — Z791 Long term (current) use of non-steroidal anti-inflammatories (NSAID): Secondary | ICD-10-CM | POA: Diagnosis not present

## 2016-11-10 DIAGNOSIS — I252 Old myocardial infarction: Secondary | ICD-10-CM | POA: Diagnosis not present

## 2016-11-10 DIAGNOSIS — M961 Postlaminectomy syndrome, not elsewhere classified: Secondary | ICD-10-CM | POA: Diagnosis not present

## 2016-11-10 DIAGNOSIS — I2510 Atherosclerotic heart disease of native coronary artery without angina pectoris: Secondary | ICD-10-CM | POA: Diagnosis not present

## 2016-11-10 DIAGNOSIS — Z888 Allergy status to other drugs, medicaments and biological substances status: Secondary | ICD-10-CM | POA: Diagnosis not present

## 2016-11-10 DIAGNOSIS — M533 Sacrococcygeal disorders, not elsewhere classified: Secondary | ICD-10-CM | POA: Diagnosis not present

## 2016-11-10 DIAGNOSIS — I10 Essential (primary) hypertension: Secondary | ICD-10-CM | POA: Diagnosis not present

## 2016-11-10 DIAGNOSIS — Z79899 Other long term (current) drug therapy: Secondary | ICD-10-CM | POA: Diagnosis not present

## 2016-11-10 DIAGNOSIS — Z7982 Long term (current) use of aspirin: Secondary | ICD-10-CM | POA: Diagnosis not present

## 2016-11-10 DIAGNOSIS — M48061 Spinal stenosis, lumbar region without neurogenic claudication: Secondary | ICD-10-CM | POA: Diagnosis not present

## 2016-11-10 DIAGNOSIS — E7800 Pure hypercholesterolemia, unspecified: Secondary | ICD-10-CM | POA: Diagnosis not present

## 2016-11-13 DIAGNOSIS — M533 Sacrococcygeal disorders, not elsewhere classified: Secondary | ICD-10-CM | POA: Insufficient documentation

## 2016-11-14 DIAGNOSIS — M533 Sacrococcygeal disorders, not elsewhere classified: Secondary | ICD-10-CM | POA: Diagnosis not present

## 2016-11-14 DIAGNOSIS — M461 Sacroiliitis, not elsewhere classified: Secondary | ICD-10-CM | POA: Diagnosis not present

## 2016-11-17 ENCOUNTER — Other Ambulatory Visit: Payer: Self-pay | Admitting: Family Medicine

## 2016-11-22 ENCOUNTER — Other Ambulatory Visit: Payer: Self-pay | Admitting: Family Medicine

## 2016-11-28 ENCOUNTER — Other Ambulatory Visit: Payer: Self-pay | Admitting: Family Medicine

## 2016-11-30 ENCOUNTER — Ambulatory Visit: Payer: Self-pay

## 2016-11-30 ENCOUNTER — Other Ambulatory Visit: Payer: Self-pay

## 2016-12-11 ENCOUNTER — Ambulatory Visit (HOSPITAL_COMMUNITY): Payer: Self-pay | Admitting: Psychiatry

## 2016-12-11 ENCOUNTER — Ambulatory Visit: Payer: Self-pay

## 2016-12-11 ENCOUNTER — Other Ambulatory Visit: Payer: Self-pay

## 2016-12-12 ENCOUNTER — Other Ambulatory Visit (HOSPITAL_COMMUNITY): Payer: Self-pay | Admitting: Psychiatry

## 2016-12-12 DIAGNOSIS — F3160 Bipolar disorder, current episode mixed, unspecified: Secondary | ICD-10-CM

## 2016-12-12 DIAGNOSIS — F3131 Bipolar disorder, current episode depressed, mild: Secondary | ICD-10-CM

## 2016-12-13 ENCOUNTER — Ambulatory Visit: Payer: Self-pay | Admitting: Family Medicine

## 2016-12-14 ENCOUNTER — Other Ambulatory Visit (HOSPITAL_COMMUNITY): Payer: Self-pay

## 2016-12-14 DIAGNOSIS — F3131 Bipolar disorder, current episode depressed, mild: Secondary | ICD-10-CM

## 2016-12-14 DIAGNOSIS — F3160 Bipolar disorder, current episode mixed, unspecified: Secondary | ICD-10-CM

## 2016-12-14 MED ORDER — DIVALPROEX SODIUM 500 MG PO DR TAB: 1500.0000 mg | DELAYED_RELEASE_TABLET | Freq: Every day | ORAL | 0 refills | Status: DC

## 2016-12-18 ENCOUNTER — Ambulatory Visit: Payer: Self-pay | Admitting: Internal Medicine

## 2016-12-18 ENCOUNTER — Other Ambulatory Visit: Payer: Self-pay | Admitting: Family Medicine

## 2016-12-18 ENCOUNTER — Other Ambulatory Visit (HOSPITAL_COMMUNITY): Payer: Self-pay

## 2016-12-18 DIAGNOSIS — F3160 Bipolar disorder, current episode mixed, unspecified: Secondary | ICD-10-CM

## 2016-12-18 DIAGNOSIS — F3131 Bipolar disorder, current episode depressed, mild: Secondary | ICD-10-CM

## 2016-12-18 MED ORDER — DIVALPROEX SODIUM 500 MG PO DR TAB: 1500.0000 mg | DELAYED_RELEASE_TABLET | Freq: Every day | ORAL | 0 refills | Status: DC

## 2016-12-19 ENCOUNTER — Other Ambulatory Visit (HOSPITAL_COMMUNITY): Payer: Self-pay

## 2016-12-19 ENCOUNTER — Ambulatory Visit (INDEPENDENT_AMBULATORY_CARE_PROVIDER_SITE_OTHER): Payer: 59 | Admitting: Psychiatry

## 2016-12-19 ENCOUNTER — Encounter (HOSPITAL_COMMUNITY): Payer: Self-pay | Admitting: Psychiatry

## 2016-12-19 DIAGNOSIS — M549 Dorsalgia, unspecified: Secondary | ICD-10-CM | POA: Diagnosis not present

## 2016-12-19 DIAGNOSIS — Z560 Unemployment, unspecified: Secondary | ICD-10-CM

## 2016-12-19 DIAGNOSIS — Z810 Family history of intellectual disabilities: Secondary | ICD-10-CM | POA: Diagnosis not present

## 2016-12-19 DIAGNOSIS — F3131 Bipolar disorder, current episode depressed, mild: Secondary | ICD-10-CM

## 2016-12-19 DIAGNOSIS — F3160 Bipolar disorder, current episode mixed, unspecified: Secondary | ICD-10-CM

## 2016-12-19 DIAGNOSIS — Z79899 Other long term (current) drug therapy: Secondary | ICD-10-CM

## 2016-12-19 DIAGNOSIS — Z818 Family history of other mental and behavioral disorders: Secondary | ICD-10-CM

## 2016-12-19 DIAGNOSIS — Z811 Family history of alcohol abuse and dependence: Secondary | ICD-10-CM

## 2016-12-19 MED ORDER — DULOXETINE HCL 60 MG PO CPEP: 60.0000 mg | ORAL_CAPSULE | Freq: Every day | ORAL | 2 refills | Status: DC

## 2016-12-19 MED ORDER — RISPERIDONE 1 MG PO TABS: ORAL_TABLET | ORAL | 2 refills | Status: DC

## 2016-12-19 MED ORDER — DIVALPROEX SODIUM 500 MG PO DR TAB: 1500.0000 mg | DELAYED_RELEASE_TABLET | Freq: Every day | ORAL | 2 refills | Status: DC

## 2016-12-19 NOTE — Progress Notes (Signed)
BH MD/PA/NP OP Progress Note  12/19/2016 2:07 PM Allison Sharp  MRN:  [id number]  Chief Complaint:  Subjective:  I'm doing fine.  My medicines are working very well.  HPI: Allison Sharp came for her follow-up appointment.  She missed appointment in May cause of traveling.  She reported medicine is working very well.  She was able to travel to multiple places.  She visited [place] and [place].  She had a good time.  She is excited about great grand mother.  Her granddaughter is expecting and she is planning to visit [place] in few weeks.  She described her relationship with the sister is going very well.  There has been no recent arguments or agitation.  She forgot to do the blood work but willing to have blood work today at the office.  She has no tremors, shakes or any EPS.  She stopped counseling because she feels she does not need it and she is learning that there are few things that not going to change and she just have to handle it.  She is sleeping better.  Her energy level is good.  She is more motivated and able to do things.  Recently she's seen her primary care physician and she had blood work but no Depakote level and hemoglobin A1c.  Patient denies any mania, psychosis, hallucination .  She denies drinking alcohol or using any illegal substances.    Visit Diagnosis:    ICD-10-CM   1. Mixed bipolar I disorder (HCC) F31.60 divalproex (DEPAKOTE) 500 MG DR tablet  2. Bipolar affective disorder, currently depressed, mild (HCC) F31.31 divalproex (DEPAKOTE) 500 MG DR tablet    DULoxetine (CYMBALTA) 60 MG capsule    risperiDONE (RISPERDAL) 1 MG tablet    Past Psychiatric History: Reviewed. Patient has significant history of psychiatric illness with multiple psychiatric inpatient treatment.  Her last admission was January 2018 at [facility].  She had history of suicidal attempt by taking overdose.  She had diagnosed with PTSD, depression, bipolar disorder.  In the past she had  tried Paxil, Prozac, Wellbutrin, Effexor, Lexapro, amitriptyline, Cymbalta, Neurontin, trazodone, Thorazine, hydroxyzine, Luvox, Sinequan, Zyprexa and Latuda.  She has best responded on Risperdal and Cymbalta.  Past Medical History:  Past Medical History:  Diagnosis Date  . Allergy   . Anxiety   . Arthritis   . Asthma   . Bipolar 1 disorder ([place])   . CAD (coronary artery disease)   . Cellulitis   . CHF (congestive heart failure) (HCC)    diastolic dysfunction  . Chronic back pain   . Chronic headaches   . Complication of anesthesia    States she typically gets sick s/p anesthesia  . Contusion of sacrum   . COPD (chronic obstructive pulmonary disease) ([place])   . Depression   . Dyspnea    PFT 03/05/09 FEV1 2.77(98%), FVC 3.25(86%), FEV1% 85, TLC 5.88(99%), DLCO 60% ,  Methacholine challenge 03/16/09 normal ,  CT chest 03/12/09 no pulmonary disease  . Fungal infection   . GERD (gastroesophageal reflux disease)   . History of colonoscopy 10/17/2002   by Dr Rehman-> distal non-specific proctitis, small ext hemorrhoids,   . HTN (hypertension)   . Hyperlipidemia   . Hyperthyroidism   . Hypothyroidism    States she only has hyperthyroidism  . Migraine headache   . Morbid obesity with body mass index of 50.0-59.9 in adult [facility]) JAN 2011 370 LBS   2004 311 BMI 45.9  . Myocardial  infarction ([place])    NOV 1997  . OSA on CPAP    she had been on 2L O2 at night but that was stopped  . PONV (postoperative nausea and vomiting)   . PTSD (post-traumatic stress disorder)   . Suicidal ideation   . Urine incontinence   . Vitamin D deficiency     Past Surgical History:  Procedure Laterality Date  . ABDOMINAL HYSTERECTOMY     sept 1996  . APPENDECTOMY    . BACK SURGERY  2008  . CARDIAC CATHETERIZATION     nov 1997  . CHOLECYSTECTOMY    . COLONOSCOPY  10/17/2002    Distal proctitis, small external hemorrhoids, otherwise/  normal colonoscopy. Suspect rectal bleeding secondary to hemorrhoids   . ESOPHAGOGASTRODUODENOSCOPY  03/18/09   fundic gland polyps/mild gastritis  . HERNIA REPAIR  1978  . JOINT REPLACEMENT     bil knee replacement  . KNEE ARTHROSCOPY    . MULTIPLE EXTRACTIONS WITH ALVEOLOPLASTY N/A 08/16/2015   Procedure: EXTRACTION OF TEETH THREE, SIX, EIGHT, NINE, ELEVEN, FOURTEEN, FIFTEEN, TWENTY-EIGHT WITH ALVEOLOPLASTY;  Surgeon: Diona Browner, DDS;  Location: [place];  Service: Oral Surgery;  Laterality: N/A;  . TONSILLECTOMY    . TOTAL VAGINAL HYSTERECTOMY    . TUBAL LIGATION      Family Psychiatric History: Reviewed.  Family History:  Family History  Problem Relation Age of Onset  . Hypertension Mother   . Bipolar disorder Mother   . Dementia Mother   . Depression Mother   . Heart attack Mother   . AAA (abdominal aortic aneurysm) Mother   . Coronary artery disease Father   . Alcohol abuse Father   . Hypertension Brother   . Coronary artery disease Brother   . Bipolar disorder Brother   . Depression Brother   . Depression Sister   . Paranoid behavior Sister   . Cancer Sister        breast  . Bipolar disorder Sister   . Depression Sister   . Hypertension Sister   . Cancer Son        thyroid  . Cancer Maternal Aunt        breast metastatized to brain  . Anesthesia problems Neg Hx   . Hypotension Neg Hx   . Malignant hyperthermia Neg Hx   . Pseudochol deficiency Neg Hx     Social History:  Social History   Social History  . Marital status: Single    Spouse name: N/A  . Number of children: 2  . Years of education: N/A   Occupational History  . Disabled Unemployed    back problems   Social History Main Topics  . Smoking status: Never Smoker  . Smokeless tobacco: Never Used  . Alcohol use No  . Drug use: No  . Sexual activity: No   Other Topics Concern  . None   Social History Narrative   Ms. Allison Sharp is disabled and lives with her sister, Geraline. She has another sister with whom she has lived with in the past, and who is a Editor, commissioning, but not currently practicing.   She has two grown sons that she does not see often, as they live in other states. She has 5 grand children.   Ms. Allison Sharp has a long history of mental illness including depression, PTSD, suicidal and homicidal ideation.   She has been obese most all of her life. Her weight has significantly impacted her QOL. She recently lost 20 lbs. By  decreasing portion size & increasing proteins.     Allergies:  Allergies  Allergen Reactions  . Ativan [Lorazepam] Other (See Comments)    Delirium  . Haldol [Haloperidol] Other (See Comments)    Hallucinating   . Naproxen Nausea And Vomiting    Metabolic Disorder Labs: Recent Results (from the past 2160 hour(s))  Urine culture     Status: None   Collection Time: 09/25/16  2:18 PM  Result Value Ref Range   Organism ID, Bacteria      Single organism less than 10,000 CFU/mL isolated. These organisms,commonly found on external and internal genitalia,are considered colonizers. No further testing performed.    Lab Results  Component Value Date   HGBA1C 5.8 (H) 11/08/2015   MPG 120 11/08/2015   MPG 120 (H) 03/30/2015   No results found for: PROLACTIN Lab Results  Component Value Date   CHOL 236 (H) 07/02/2015   TRIG 181 (H) 07/02/2015   HDL 39 (L) 07/02/2015   CHOLHDL 6.1 (H) 07/02/2015   VLDL 30.0 12/10/2013   LDLCALC 161 (H) 07/02/2015   LDLCALC 89 12/10/2013     Current Medications: Current Outpatient Prescriptions  Medication Sig Dispense Refill  . albuterol (PROVENTIL) (2.5 MG/3ML) 0.083% nebulizer solution Take 3 mLs (2.5 mg total) by nebulization every 4 (four) hours as needed for wheezing or shortness of breath. 75 mL 0  . aspirin 81 MG chewable tablet Chew 81 mg by mouth daily.    Marland Kitchen atorvastatin (LIPITOR) 20 MG tablet TAKE 1 TABLET DAILY 90 tablet 0  . BREO ELLIPTA 100-25 MCG/INH AEPB INHALE 1 PUFF INTO THE LUNGS DAILY 60 each 5  . celecoxib (CELEBREX) 200 MG capsule Take 1 capsule by  mouth 2 (two) times daily.    . cetirizine (ZYRTEC) 10 MG tablet TAKE 1 TABLET ONCE DAILY 30 tablet 11  . cloNIDine (CATAPRES) 0.1 MG tablet TAKE ONE TABLET AT BEDTIME 30 tablet 0  . cloNIDine (CATAPRES) 0.2 MG tablet Take 0.2 mg by mouth at bedtime.    . diclofenac sodium (VOLTAREN) 1 % GEL Apply to affected area twice daily as needed (Patient taking differently: Apply 2 g topically 2 (two) times daily as needed (PAIN). ) 100 g 5  . divalproex (DEPAKOTE) 500 MG DR tablet Take 3 tablets (1,500 mg total) by mouth at bedtime. 90 tablet 0  . DULoxetine (CYMBALTA) 60 MG capsule Take 1 capsule (60 mg total) by mouth daily. 90 capsule 0  . fluticasone (FLONASE) 50 MCG/ACT nasal spray Place 1 spray into both nostrils 2 (two) times daily as needed for allergies or rhinitis. 16 g 6  . fosfomycin (MONUROL) 3 g PACK Take 3 g by mouth every 3 days for 3 doses. 9 g 0  . levothyroxine (SYNTHROID, LEVOTHROID) 112 MCG tablet TAKE (1) TABLET DAILY BE- FORE BREAKFAST FOR THYROID 90 tablet 0  . lisinopril-hydrochlorothiazide (PRINZIDE,ZESTORETIC) 20-25 MG tablet Take 1 tablet by mouth daily. 30 tablet 1  . Multiple Vitamin (MULTIVITAMIN WITH MINERALS) TABS Take 1 tablet by mouth daily. For nutritional supplementation. 30 tablet 0  . omeprazole (PRILOSEC) 20 MG capsule TAKE (1) CAPSULE DAILY 30 capsule 1  . ondansetron (ZOFRAN ODT) 4 MG disintegrating tablet Take 1 tablet (4 mg total) by mouth every 8 (eight) hours as needed for nausea or vomiting. 20 tablet 0  . pantoprazole (PROTONIX) 40 MG tablet Take 40 mg by mouth daily.    Marland Kitchen PROAIR HFA 108 (90 Base) MCG/ACT inhaler USE 2 PUFF EVERY 4 HOURS AS  NEEDED FOR WHEEZING OR SHORTNESS OF BREATH 8.5 g 5  . risperiDONE (RISPERDAL) 1 MG tablet Take 1 tablet (1 mg total) by mouth daily. 90 tablet 0  . risperiDONE (RISPERDAL) 2 MG tablet Take 1 tablet (2 mg total) by mouth at bedtime. 69 tablet 2   No current facility-administered medications for this visit.      Neurologic: Headache: No Seizure: No Paresthesias: No  Musculoskeletal: Strength & Muscle Tone: within normal limits Gait & Station: normal Patient leans: N/A  Psychiatric Specialty Exam: Review of Systems  Constitutional: Negative.   HENT: Negative.   Respiratory: Negative.   Cardiovascular: Negative.   Musculoskeletal: Positive for back pain.  Skin: Negative.   Neurological: Negative.     Blood pressure 130/78, pulse (!) 105, height 5\' 9"  (1.753 m), weight (!) 385 lb 14.4 oz (175 kg), last menstrual period 02/18/1995.Body mass index is 56.99 kg/m.  General Appearance: Casual and Obese  Eye Contact:  Good  Speech:  Clear and Coherent  Volume:  Normal  Mood:  Euthymic  Affect:  Appropriate  Thought Process:  Goal Directed  Orientation:  Full (Time, Place, and Person)  Thought Content: Logical   Suicidal Thoughts:  No  Homicidal Thoughts:  No  Memory:  Immediate;   Good Recent;   Good Remote;   Good  Judgement:  Good  Insight:  Good  Psychomotor Activity:  Normal  Concentration:  Concentration: Good and Attention Span: Good  Recall:  Good  Fund of Knowledge: Good  Language: Good  Akathisia:  No  Handed:  Right  AIMS (if indicated):  0  Assets:  Communication Skills Desire for Improvement Housing Resilience Social Support  ADL's:  Intact  Cognition: WNL  Sleep:  Improved     Assessment: Bipolar disorder, mixed type.  Plan: Patient is doing much better on her current medication.  Continue Risperdal 1 mg in the morning and 2 mg at bedtime, Depakote 1500 mg at bedtime and Cymbalta 60 mg daily.  Discussed medication side effects and benefits.  I also reviewed collateral information from other providers.  We will do Depakote level and hemoglobin A1c.  Patient is not interested in counseling.  Recommended to call us back if she has any question, concern or if she feels worsening of the symptom.  Follow-up in 3 months. Time spent 25 minutes.  More than 50% of  the time spent in psychoeducation, counseling and coordination of care.  Discuss safety plan that anytime having active suicidal thoughts or homicidal thoughts then patient need to call 911 or go to the local emergency room.    Telesia Ates T., MD 12/19/2016, 2:07 PM

## 2016-12-20 ENCOUNTER — Ambulatory Visit: Payer: Self-pay

## 2016-12-20 ENCOUNTER — Other Ambulatory Visit: Payer: Self-pay

## 2016-12-21 ENCOUNTER — Ambulatory Visit: Payer: Self-pay

## 2016-12-21 ENCOUNTER — Other Ambulatory Visit: Payer: Self-pay

## 2017-01-03 ENCOUNTER — Ambulatory Visit: Payer: Self-pay

## 2017-01-03 ENCOUNTER — Other Ambulatory Visit: Payer: Self-pay

## 2017-01-04 ENCOUNTER — Telehealth (HOSPITAL_COMMUNITY): Payer: Self-pay

## 2017-01-04 DIAGNOSIS — F3131 Bipolar disorder, current episode depressed, mild: Secondary | ICD-10-CM

## 2017-01-04 NOTE — Telephone Encounter (Signed)
Patient called and states that you increased her Risperidone at the last visit and she is too sleepy now. Patient said she is having issues staying awake. Please review and advise, thank you

## 2017-01-05 ENCOUNTER — Other Ambulatory Visit: Payer: Self-pay | Admitting: Family Medicine

## 2017-01-05 ENCOUNTER — Other Ambulatory Visit (HOSPITAL_COMMUNITY): Payer: Self-pay | Admitting: Psychiatry

## 2017-01-05 DIAGNOSIS — E785 Hyperlipidemia, unspecified: Secondary | ICD-10-CM

## 2017-01-05 MED ORDER — RISPERIDONE 1 MG PO TABS: ORAL_TABLET | ORAL | 2 refills | Status: DC

## 2017-01-05 NOTE — Telephone Encounter (Signed)
I did not increase to Risperdal but advised to take all the Risperdal at bedtime few months ago.  If she sleeping too much that she can cut down the Risperdal and take half tablet at bedtime.

## 2017-01-05 NOTE — Telephone Encounter (Signed)
Called patient and she said that she is taking Risperdal 1 mg one in the morning and two at bedtime. I advised patient that I would call the pharmacy and update the order to 1 mg q am and 1 mg qhs, I told patient to call me and let me know how she is doing. I did call the pharmacy and gave them the new order.

## 2017-01-08 NOTE — Telephone Encounter (Signed)
Last lipid 07/02/15  Dr Dettinger

## 2017-01-09 ENCOUNTER — Ambulatory Visit: Payer: Medicare Other | Admitting: Obstetrics & Gynecology

## 2017-01-12 ENCOUNTER — Other Ambulatory Visit: Payer: Self-pay | Admitting: Family Medicine

## 2017-01-22 ENCOUNTER — Other Ambulatory Visit (HOSPITAL_COMMUNITY): Payer: Self-pay

## 2017-01-22 DIAGNOSIS — Z79899 Other long term (current) drug therapy: Secondary | ICD-10-CM

## 2017-01-22 DIAGNOSIS — Z1231 Encounter for screening mammogram for malignant neoplasm of breast: Secondary | ICD-10-CM

## 2017-01-22 DIAGNOSIS — E2839 Other primary ovarian failure: Secondary | ICD-10-CM

## 2017-01-25 ENCOUNTER — Other Ambulatory Visit: Payer: Self-pay | Admitting: Family Medicine

## 2017-02-08 ENCOUNTER — Other Ambulatory Visit: Payer: Self-pay | Admitting: Family Medicine

## 2017-02-08 DIAGNOSIS — E039 Hypothyroidism, unspecified: Secondary | ICD-10-CM

## 2017-02-09 NOTE — Telephone Encounter (Signed)
Last seen 08/21/16

## 2017-02-09 NOTE — Telephone Encounter (Signed)
Patient needs an appointment with a recheck of these. Give her enough for 1 month for now but she needs to see me within the month. We need to do blood work

## 2017-02-09 NOTE — Telephone Encounter (Signed)
Last seen 08/21/16    Last thyroid 12/03/15

## 2017-02-12 ENCOUNTER — Telehealth: Payer: Self-pay | Admitting: Obstetrics & Gynecology

## 2017-02-12 NOTE — Telephone Encounter (Signed)
Called patient but could not leave a message for patient to call back to schedule a new patient doctor referral for an AEX, her voicemail has not been set up.

## 2017-02-13 ENCOUNTER — Ambulatory Visit: Payer: Self-pay | Admitting: Urology

## 2017-02-13 DIAGNOSIS — G4733 Obstructive sleep apnea (adult) (pediatric): Secondary | ICD-10-CM | POA: Diagnosis not present

## 2017-02-13 DIAGNOSIS — R0902 Hypoxemia: Secondary | ICD-10-CM | POA: Diagnosis not present

## 2017-02-13 NOTE — Telephone Encounter (Signed)
Called and left a message for patient to call back to schedule a new patient doctor referral. °

## 2017-02-19 ENCOUNTER — Other Ambulatory Visit: Payer: Self-pay | Admitting: Nurse Practitioner

## 2017-02-19 NOTE — Telephone Encounter (Signed)
Called and spoke with a person who told me the patient does not live there. Routing back to referring office for follow up.

## 2017-02-20 ENCOUNTER — Other Ambulatory Visit: Payer: Self-pay | Admitting: Family Medicine

## 2017-03-01 DIAGNOSIS — M25561 Pain in right knee: Secondary | ICD-10-CM | POA: Diagnosis not present

## 2017-03-02 ENCOUNTER — Other Ambulatory Visit: Payer: Self-pay | Admitting: Family Medicine

## 2017-03-15 ENCOUNTER — Ambulatory Visit: Payer: Medicare Other | Admitting: Family Medicine

## 2017-03-19 ENCOUNTER — Encounter: Payer: Self-pay | Admitting: Family Medicine

## 2017-03-22 ENCOUNTER — Other Ambulatory Visit: Payer: Self-pay | Admitting: Family Medicine

## 2017-03-23 NOTE — Telephone Encounter (Signed)
Last seen 08/21/16  Dr Dettinger

## 2017-04-03 ENCOUNTER — Other Ambulatory Visit: Payer: Self-pay | Admitting: Family Medicine

## 2017-04-03 ENCOUNTER — Other Ambulatory Visit (HOSPITAL_COMMUNITY): Payer: Self-pay | Admitting: Psychiatry

## 2017-04-03 DIAGNOSIS — E039 Hypothyroidism, unspecified: Secondary | ICD-10-CM

## 2017-04-03 DIAGNOSIS — E785 Hyperlipidemia, unspecified: Secondary | ICD-10-CM

## 2017-04-03 DIAGNOSIS — F3131 Bipolar disorder, current episode depressed, mild: Secondary | ICD-10-CM

## 2017-04-03 DIAGNOSIS — F3160 Bipolar disorder, current episode mixed, unspecified: Secondary | ICD-10-CM

## 2017-04-04 ENCOUNTER — Ambulatory Visit (HOSPITAL_COMMUNITY): Payer: Self-pay | Admitting: Psychiatry

## 2017-04-04 NOTE — Telephone Encounter (Signed)
Last seen 3.26.18  Dr Warrick Parisian

## 2017-04-05 DIAGNOSIS — Z79899 Other long term (current) drug therapy: Secondary | ICD-10-CM | POA: Diagnosis not present

## 2017-04-06 LAB — HEMOGLOBIN A1C
HEMOGLOBIN A1C: 5.3 %{Hb} (ref <5.7)
Mean Plasma Glucose: 105 (calc)
eAG (mmol/L): 5.8 (calc)

## 2017-04-06 LAB — VALPROIC ACID LEVEL: Valproic Acid Lvl: 86.1 mg/L (ref 50.0–100.0)

## 2017-04-09 ENCOUNTER — Ambulatory Visit (INDEPENDENT_AMBULATORY_CARE_PROVIDER_SITE_OTHER): Payer: Medicare Other | Admitting: Psychiatry

## 2017-04-09 ENCOUNTER — Encounter (HOSPITAL_COMMUNITY): Payer: Self-pay | Admitting: Psychiatry

## 2017-04-09 DIAGNOSIS — Z560 Unemployment, unspecified: Secondary | ICD-10-CM

## 2017-04-09 DIAGNOSIS — M2550 Pain in unspecified joint: Secondary | ICD-10-CM

## 2017-04-09 DIAGNOSIS — Z818 Family history of other mental and behavioral disorders: Secondary | ICD-10-CM

## 2017-04-09 DIAGNOSIS — Z811 Family history of alcohol abuse and dependence: Secondary | ICD-10-CM | POA: Diagnosis not present

## 2017-04-09 DIAGNOSIS — Z810 Family history of intellectual disabilities: Secondary | ICD-10-CM | POA: Diagnosis not present

## 2017-04-09 DIAGNOSIS — F3131 Bipolar disorder, current episode depressed, mild: Secondary | ICD-10-CM

## 2017-04-09 DIAGNOSIS — M549 Dorsalgia, unspecified: Secondary | ICD-10-CM

## 2017-04-09 DIAGNOSIS — R51 Headache: Secondary | ICD-10-CM

## 2017-04-09 DIAGNOSIS — F3160 Bipolar disorder, current episode mixed, unspecified: Secondary | ICD-10-CM

## 2017-04-09 MED ORDER — DIVALPROEX SODIUM 500 MG PO DR TAB: 1500.0000 mg | DELAYED_RELEASE_TABLET | Freq: Every day | ORAL | 2 refills | Status: DC

## 2017-04-09 MED ORDER — DULOXETINE HCL 60 MG PO CPEP: 60.0000 mg | ORAL_CAPSULE | Freq: Every day | ORAL | 2 refills | Status: DC

## 2017-04-09 MED ORDER — RISPERIDONE 3 MG PO TABS: 3.0000 mg | ORAL_TABLET | Freq: Every day | ORAL | 2 refills | Status: DC

## 2017-04-09 NOTE — Progress Notes (Signed)
[facility] MD/PA/NP OP Progress Note  04/09/2017 8:54 AM Allison Sharp  MRN:  [id number]  Chief Complaint: I am sleeping better since I am taking Risperdal 3 mg at bedtime.  HPI: Allison Sharp came for her follow-up appointment.  She is taking Risperdal 3 mg all at bedtime.  In the beginning she was complaining of excessive sleep we cut down the Risperdal to 2 mg but then she has difficulty falling asleep and she decided to go back on 3 mg.  She is now moved to her niece because her sister did not want her to help to pay the bills.  Patient reported her sister recently got a lot of money after the settlement and she does not need her to pay the bills.  Patient admitted she was disappointed but she is doing very well at her knees house.  She is getting along with her knees and there has been no recent irritability, agitation, anger, mania or any psychosis.  Her Depakote level is therapeutic.  Her hemoglobin A1c is 5.3.  She has no tremors shakes or any EPS.  Sometimes she has headaches and she is scheduled to see her primary care physician in coming weeks.  Patient has no tremors or shakes.  She was hoping to visit [place] to visit her daughter but now she is trying to help her best friend who is going for a cruise and she will do dog sitting.  Her energy level is fair.  She wants to continue Depakote Cymbalta and Risperdal.  She denies drinking alcohol or using any illegal substances.  Visit Diagnosis:    ICD-10-CM   1. Bipolar affective disorder, currently depressed, mild (HCC) F31.31 risperiDONE (RISPERDAL) 3 MG tablet    DULoxetine (CYMBALTA) 60 MG capsule    divalproex (DEPAKOTE) 500 MG DR tablet  2. Mixed bipolar I disorder (HCC) F31.60 divalproex (DEPAKOTE) 500 MG DR tablet    Past Psychiatric History: Reviewed. Patient has significant history of psychiatric illness with multiple psychiatric inpatient treatment. Her last admission was in January 2018 at [facility]. She had history of  suicidal attempt by taking overdose. She had diagnosed with PTSD, depression, bipolar disorder. In the past she had tried Paxil, Prozac, Wellbutrin, Effexor, Lexapro, amitriptyline, Cymbalta, Neurontin, trazodone, Thorazine, hydroxyzine, Luvox, Sinequan, Zyprexa and Latuda. She has best responded on Risperdal and Cymbalta.  Past Medical History:  Past Medical History:  Diagnosis Date  . Allergy   . Anxiety   . Arthritis   . Asthma   . Bipolar 1 disorder ([place])   . CAD (coronary artery disease)   . Cellulitis   . CHF (congestive heart failure) (HCC)    diastolic dysfunction  . Chronic back pain   . Chronic headaches   . Complication of anesthesia    States she typically gets sick s/p anesthesia  . Contusion of sacrum   . COPD (chronic obstructive pulmonary disease) ([place])   . Depression   . Dyspnea    PFT 03/05/09 FEV1 2.77(98%), FVC 3.25(86%), FEV1% 85, TLC 5.88(99%), DLCO 60% ,  Methacholine challenge 03/16/09 normal ,  CT chest 03/12/09 no pulmonary disease  . Fungal infection   . GERD (gastroesophageal reflux disease)   . History of colonoscopy 10/17/2002   by Dr Rehman-> distal non-specific proctitis, small ext hemorrhoids,   . HTN (hypertension)   . Hyperlipidemia   . Hyperthyroidism   . Hypothyroidism    States she only has hyperthyroidism  . Migraine headache   . Morbid obesity  with body mass index of 50.0-59.9 in adult [facility]) JAN 2011 370 LBS   2004 311 BMI 45.9  . Myocardial infarction ([place])    NOV 1997  . OSA on CPAP    she had been on 2L O2 at night but that was stopped  . PONV (postoperative nausea and vomiting)   . PTSD (post-traumatic stress disorder)   . Suicidal ideation   . Urine incontinence   . Vitamin D deficiency     Past Surgical History:  Procedure Laterality Date  . ABDOMINAL HYSTERECTOMY     sept 1996  . APPENDECTOMY    . BACK SURGERY  2008  . CARDIAC CATHETERIZATION     nov 1997  . CHOLECYSTECTOMY    . COLONOSCOPY  10/17/2002    Distal  proctitis, small external hemorrhoids, otherwise/  normal colonoscopy. Suspect rectal bleeding secondary to hemorrhoids  . ESOPHAGOGASTRODUODENOSCOPY  03/18/09   fundic gland polyps/mild gastritis  . HERNIA REPAIR  1978  . JOINT REPLACEMENT     bil knee replacement  . KNEE ARTHROSCOPY    . TONSILLECTOMY    . TOTAL VAGINAL HYSTERECTOMY    . TUBAL LIGATION      Family Psychiatric History: Reviewed.  Family History:  Family History  Problem Relation Age of Onset  . Hypertension Mother   . Bipolar disorder Mother   . Dementia Mother   . Depression Mother   . Heart attack Mother   . AAA (abdominal aortic aneurysm) Mother   . Coronary artery disease Father   . Alcohol abuse Father   . Hypertension Brother   . Coronary artery disease Brother   . Bipolar disorder Brother   . Depression Brother   . Depression Sister   . Paranoid behavior Sister   . Cancer Sister        breast  . Bipolar disorder Sister   . Depression Sister   . Hypertension Sister   . Cancer Son        thyroid  . Cancer Maternal Aunt        breast metastatized to brain  . Anesthesia problems Neg Hx   . Hypotension Neg Hx   . Malignant hyperthermia Neg Hx   . Pseudochol deficiency Neg Hx     Social History:  Social History   Socioeconomic History  . Marital status: Single    Spouse name: Not on file  . Number of children: 2  . Years of education: Not on file  . Highest education level: Not on file  Social Needs  . Financial resource strain: Not on file  . Food insecurity - worry: Not on file  . Food insecurity - inability: Not on file  . Transportation needs - medical: Not on file  . Transportation needs - non-medical: Not on file  Occupational History  . 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 Smoking status: Never Smoker  . Smokeless tobacco: Never Used  Substance and Sexual Activity  . Alcohol use: No    Alcohol/week: 0.0 oz  . Drug use: No   . Sexual activity: No    Birth control/protection: Abstinence  Other Topics Concern  . Not on file  Social History Narrative   Allison Sharp is disabled and lives with her sister, Allison Sharp. She has another sister with whom she has lived with in the past, and who is a Designer, jewellery, but not currently practicing.   She has two grown  sons that she does not see often, as they live in other states. She has 5 grand children.   Allison Sharp has a long history of mental illness including depression, PTSD, suicidal and homicidal ideation.   She has been obese most all of her life. Her weight has significantly impacted her QOL. She recently lost 20 lbs. By decreasing portion size & increasing proteins.     Allergies:  Allergies  Allergen Reactions  . Ativan [Lorazepam] Other (See Comments)    Delirium  . Haldol [Haloperidol] Other (See Comments)    Hallucinating   . Naproxen Nausea And Vomiting    Metabolic Disorder Labs: Lab Results  Component Value Date   HGBA1C 5.3 04/05/2017   MPG 105 04/05/2017   MPG 120 11/08/2015   No results found for: PROLACTIN Lab Results  Component Value Date   CHOL 236 (H) 07/02/2015   TRIG 181 (H) 07/02/2015   HDL 39 (L) 07/02/2015   CHOLHDL 6.1 (H) 07/02/2015   VLDL 30.0 12/10/2013   LDLCALC 161 (H) 07/02/2015   LDLCALC 89 12/10/2013   Lab Results  Component Value Date   TSH 2.95 12/03/2015   TSH 2.994 01/19/2015    Therapeutic Level Labs: No results found for: LITHIUM Lab Results  Component Value Date   VALPROATE 86.1 04/05/2017   VALPROATE 67 06/20/2016   No components found for:  CBMZ  Current Medications: Current Outpatient Medications  Medication Sig Dispense Refill  . albuterol (PROVENTIL) (2.5 MG/3ML) 0.083% nebulizer solution Take 3 mLs (2.5 mg total) by nebulization every 4 (four) hours as needed for wheezing or shortness of breath. 75 mL 0  . aspirin 81 MG chewable tablet Chew 81 mg by mouth daily.    Marland Kitchen atorvastatin (LIPITOR) 20 MG  tablet TAKE 1 TABLET DAILY 90 tablet 0  . BREO ELLIPTA 100-25 MCG/INH AEPB INHALE 1 PUFF INTO THE LUNGS DAILY 60 each 5  . celecoxib (CELEBREX) 200 MG capsule Take 1 capsule by mouth 2 (two) times daily.    . cetirizine (ZYRTEC) 10 MG tablet TAKE 1 TABLET ONCE DAILY 30 tablet 11  . cloNIDine (CATAPRES) 0.1 MG tablet TAKE ONE TABLET AT BEDTIME 30 tablet 0  . cloNIDine (CATAPRES) 0.2 MG tablet Take 0.2 mg by mouth at bedtime.    . diclofenac sodium (VOLTAREN) 1 % GEL Apply to affected area twice daily as needed (Patient taking differently: Apply 2 g topically 2 (two) times daily as needed (PAIN). ) 100 g 5  . divalproex (DEPAKOTE) 500 MG DR tablet Take 3 tablets (1,500 mg total) by mouth at bedtime. 90 tablet 2  . DULoxetine (CYMBALTA) 60 MG capsule Take 1 capsule (60 mg total) by mouth daily. 90 capsule 2  . fluticasone (FLONASE) 50 MCG/ACT nasal spray Place 1 spray into both nostrils 2 (two) times daily as needed for allergies or rhinitis. 16 g 6  . fosfomycin (MONUROL) 3 g PACK Take 3 g by mouth every 3 days for 3 doses. 9 g 0  . levothyroxine (SYNTHROID, LEVOTHROID) 112 MCG tablet TAKE (1) TABLET DAILY BE- FORE BREAKFAST FOR THYROID 90 tablet 0  . lisinopril-hydrochlorothiazide (PRINZIDE,ZESTORETIC) 20-25 MG tablet Take 1 tablet by mouth daily. 30 tablet 0  . lisinopril-hydrochlorothiazide (PRINZIDE,ZESTORETIC) 20-25 MG tablet Take 1 tablet by mouth daily. 30 tablet 0  . Multiple Vitamin (MULTIVITAMIN WITH MINERALS) TABS Take 1 tablet by mouth daily. For nutritional supplementation. 30 tablet 0  . nystatin cream (MYCOSTATIN) APPLY TO THE AFFECTED AREA THREE TIMES DAILY AS  NEEDED .Marland KitchenFOR TOPICAL USE ONLY... 30 g 0  . omeprazole (PRILOSEC) 20 MG capsule TAKE (1) CAPSULE DAILY 30 capsule 0  . ondansetron (ZOFRAN ODT) 4 MG disintegrating tablet Take 1 tablet (4 mg total) by mouth every 8 (eight) hours as needed for nausea or vomiting. 20 tablet 0  . pantoprazole (PROTONIX) 40 MG tablet Take 40 mg by  mouth daily.    Marland Kitchen PROAIR HFA 108 (90 Base) MCG/ACT inhaler USE 2 PUFF EVERY 4 HOURS AS NEEDED FOR WHEEZING OR SHORTNESS OF BREATH 8.5 g 5  . risperiDONE (RISPERDAL) 1 MG tablet Take 1 tab in am and 1 at bed time 60 tablet 2   No current facility-administered medications for this visit.      Musculoskeletal: Strength & Muscle Tone: within normal limits Gait & Station: normal Patient leans: N/A  Psychiatric Specialty Exam: Review of Systems  Musculoskeletal: Positive for back pain and joint pain.  Neurological: Positive for headaches.    Blood pressure 136/80, pulse 78, height 5\' 9"  (1.753 m), weight (!) 397 lb (180.1 kg), last menstrual period 02/18/1995.There is no height or weight on file to calculate BMI.  General Appearance: Casual  Eye Contact:  Good  Speech:  Clear and Coherent  Volume:  Normal  Mood:  Euthymic  Affect:  Appropriate  Thought Process:  Goal Directed  Orientation:  Full (Time, Place, and Person)  Thought Content: Logical   Suicidal Thoughts:  No  Homicidal Thoughts:  No  Memory:  Immediate;   Good Recent;   Good Remote;   Good  Judgement:  Good  Insight:  Good  Psychomotor Activity:  Normal  Concentration:  Concentration: Good and Attention Span: Good  Recall:  Good  Fund of Knowledge: Good  Language: Good  Akathisia:  No  Handed:  Right  AIMS (if indicated): not done  Assets:  Communication Skills Desire for Improvement Housing Resilience  ADL's:  Intact  Cognition: WNL  Sleep:  Fair   Screenings: AIMS     Admission (Discharged) from 01/17/2015 in [facility]  AIMS Total Score  0    AUDIT     Admission (Discharged) from 01/17/2015 in [facility] Admission (Discharged) from 10/24/2012 in [facility] [facility]  Alcohol Use Disorder Identification Test Final Score (AUDIT)  0  0    Mini-Mental     Clinical Support from 11/08/2016 in [facility] from 06/28/2015 in [facility]  Total Score (max 30 points )  30  29    PHQ2-9     Clinical Support from 11/08/2016 in [place] Office Visit from 08/28/2016 in [facility] for [facility] Office Visit from 08/21/2016 in [place] Visit from 07/06/2016 in [place] Visit from 03/15/2016 in [facility]  PHQ-2 Total Score  1  2  4  6   0  PHQ-9 Total Score  No data  12  14  22   No data       Assessment and Plan: Bipolar disorder, mixed type.  I reviewed her blood work results.  Her Depakote level is therapeutic and her hemoglobin C is 5.3.  Patient is stable on her Risperdal 3 mg at bedtime.  Continue Depakote 1500 mg at bedtime and Cymbalta 60 mg daily.  Discussed medication side effects especially sedation with Depakote and Risperdal.  Patient does not want  to change the medication because she feel Risperdal 3 mg at bedtime and Depakote at bedtime helping her sleep and irritability.  Patient had gained weight from the past.  Encouraged to watch her calorie intake and do regular exercise.  Recommended to call us back if she has any question or any concern.  Follow-up in 3 months.   Allea Kassner T., MD 04/09/2017, 8:54 AM

## 2017-04-16 ENCOUNTER — Encounter: Payer: Self-pay | Admitting: Physician Assistant

## 2017-04-16 ENCOUNTER — Ambulatory Visit (INDEPENDENT_AMBULATORY_CARE_PROVIDER_SITE_OTHER): Payer: Medicare Other | Admitting: Physician Assistant

## 2017-04-16 DIAGNOSIS — J019 Acute sinusitis, unspecified: Secondary | ICD-10-CM

## 2017-04-16 MED ORDER — METHYLPREDNISOLONE ACETATE 80 MG/ML IJ SUSP
80.0000 mg | Freq: Once | INTRAMUSCULAR | Status: AC
  Administered 2017-04-16: 80 mg via INTRAMUSCULAR

## 2017-04-16 MED ORDER — FLUTICASONE PROPIONATE 50 MCG/ACT NA SUSP: 1.0000 | Freq: Two times a day (BID) | NASAL | 6 refills | Status: DC | PRN

## 2017-04-16 MED ORDER — LEVOFLOXACIN 500 MG PO TABS: 500.0000 mg | ORAL_TABLET | Freq: Every day | ORAL | 0 refills | Status: DC

## 2017-04-16 NOTE — Patient Instructions (Signed)
In a few days you may receive a survey in the mail or online from [employer] regarding your visit with us today. Please take a moment to fill this out. Your feedback is very important to our whole office. It can help us better understand your needs as well as improve your experience and satisfaction. Thank you for taking your time to complete it. We care about you.  Damek Ende, PA-C  

## 2017-04-16 NOTE — Progress Notes (Signed)
BP 123/84   Pulse 96   Temp (!) 97.5 F (36.4 C) (Oral)   Ht 5\' 9"  (1.753 m)   Wt (!) 403 lb (182.8 kg)   LMP 02/18/1995   SpO2 98%   BMI 59.51 kg/m    Subjective:    Patient ID: Allison Sharp, female    DOB: Oct 13, 1955, 61 y.o.   MRN: [id number]  HPI: Allison Sharp is a 61 y.o. female presenting on 04/16/2017 for Sinus Problem (pt here today c/o sinus pressure, headache, congestion, watery eyes, cough and sore throat.)  This patient has had many days of sinus headache and postnasal drainage. There is copious drainage at times. Denies any fever at this time. There has been a history of sinus infections in the past.  No history of sinus surgery. There is cough at night. It has become more prevalent in recent days.  Relevant past medical, surgical, family and social history reviewed and updated as indicated. Allergies and medications reviewed and updated.  Past Medical History:  Diagnosis Date  . Allergy   . Anxiety   . Arthritis   . Asthma   . Bipolar 1 disorder ([place])   . CAD (coronary artery disease)   . Cellulitis   . CHF (congestive heart failure) (HCC)    diastolic dysfunction  . Chronic back pain   . Chronic headaches   . Complication of anesthesia    States she typically gets sick s/p anesthesia  . Contusion of sacrum   . COPD (chronic obstructive pulmonary disease) ([place])   . Depression   . Dyspnea    PFT 03/05/09 FEV1 2.77(98%), FVC 3.25(86%), FEV1% 85, TLC 5.88(99%), DLCO 60% ,  Methacholine challenge 03/16/09 normal ,  CT chest 03/12/09 no pulmonary disease  . Fungal infection   . GERD (gastroesophageal reflux disease)   . History of colonoscopy 10/17/2002   by Dr Rehman-> distal non-specific proctitis, small ext hemorrhoids,   . HTN (hypertension)   . Hyperlipidemia   . Hyperthyroidism   . Hypothyroidism    States she only has hyperthyroidism  . Migraine headache   . Morbid obesity with body mass index of 50.0-59.9 in adult [facility]) JAN 2011 370 LBS   2004 311 BMI 45.9  . Myocardial infarction ([place])    NOV 1997  . OSA on CPAP    she had been on 2L O2 at night but that was stopped  . PONV (postoperative nausea and vomiting)   . PTSD (post-traumatic stress disorder)   . Suicidal ideation   . Urine incontinence   . Vitamin D deficiency     Past Surgical History:  Procedure Laterality Date  . ABDOMINAL HYSTERECTOMY     sept 1996  . APPENDECTOMY    . BACK SURGERY  2008  . CARDIAC CATHETERIZATION     nov 1997  . CHOLECYSTECTOMY    . COLONOSCOPY  10/17/2002    Distal proctitis, small external hemorrhoids, otherwise/  normal colonoscopy. Suspect rectal bleeding secondary to hemorrhoids  . ESOPHAGOGASTRODUODENOSCOPY  03/18/09   fundic gland polyps/mild gastritis  . EXTRACTION OF TEETH THREE, SIX, EIGHT, NINE, ELEVEN, FOURTEEN, FIFTEEN, TWENTY-EIGHT WITH ALVEOLOPLASTY N/A 08/16/2015   Performed by Diona Browner, DDS at [facility]  . HERNIA REPAIR  1978  . JOINT REPLACEMENT     bil knee replacement  . KNEE ARTHROSCOPY    . TONSILLECTOMY    . TOTAL VAGINAL HYSTERECTOMY    . TUBAL LIGATION      Review of Systems  Constitutional: Positive for chills and fatigue. Negative for activity change and appetite change.  HENT: Positive for congestion, ear pain, postnasal drip, sinus pressure, sinus pain and sore throat.   Eyes: Negative.   Respiratory: Positive for cough. Negative for wheezing.   Cardiovascular: Negative.  Negative for chest pain, palpitations and leg swelling.  Gastrointestinal: Negative.   Genitourinary: Negative.   Musculoskeletal: Negative.   Skin: Negative.   Neurological: Positive for dizziness and headaches.    Allergies as of 04/16/2017      Reactions   Ativan [lorazepam] Other (See Comments)   Delirium   Haldol [haloperidol] Other (See Comments)   Hallucinating    Naproxen Nausea And Vomiting      Medication List        Accurate as of 04/16/17  6:23 PM. Always use your most recent med list.            albuterol (2.5 MG/3ML) 0.083% nebulizer solution Commonly known as:  PROVENTIL Take 3 mLs (2.5 mg total) by nebulization every 4 (four) hours as needed for wheezing or shortness of breath.   PROAIR HFA 108 (90 Base) MCG/ACT inhaler Generic drug:  albuterol USE 2 PUFF EVERY 4 HOURS AS NEEDED FOR WHEEZING OR SHORTNESS OF BREATH   atorvastatin 20 MG tablet Commonly known as:  LIPITOR TAKE 1 TABLET DAILY   BREO ELLIPTA 100-25 MCG/INH Aepb Generic drug:  fluticasone furoate-vilanterol INHALE 1 PUFF INTO THE LUNGS DAILY   celecoxib 200 MG capsule Commonly known as:  CELEBREX Take 1 capsule by mouth 2 (two) times daily.   cetirizine 10 MG tablet Commonly known as:  ZYRTEC TAKE 1 TABLET ONCE DAILY   cloNIDine 0.2 MG tablet Commonly known as:  CATAPRES Take 0.2 mg by mouth at bedtime.   cloNIDine 0.1 MG tablet Commonly known as:  CATAPRES TAKE ONE TABLET AT BEDTIME   diclofenac sodium 1 % Gel Commonly known as:  VOLTAREN Apply to affected area twice daily as needed   divalproex 500 MG DR tablet Commonly known as:  DEPAKOTE Take 3 tablets (1,500 mg total) at bedtime by mouth.   DULoxetine 60 MG capsule Commonly known as:  CYMBALTA Take 1 capsule (60 mg total) daily by mouth.   fluticasone 50 MCG/ACT nasal spray Commonly known as:  FLONASE Place 1 spray 2 (two) times daily as needed into both nostrils for allergies or rhinitis.   fosfomycin 3 g Pack Commonly known as:  MONUROL Take 3 g by mouth every 3 days for 3 doses.   levofloxacin 500 MG tablet Commonly known as:  LEVAQUIN Take 1 tablet (500 mg total) daily by mouth.   levothyroxine 112 MCG tablet Commonly known as:  SYNTHROID, LEVOTHROID TAKE (1) TABLET DAILY BE- FORE BREAKFAST FOR THYROID   lisinopril-hydrochlorothiazide 20-25 MG tablet Commonly known as:  PRINZIDE,ZESTORETIC Take 1 tablet by mouth daily.   lisinopril-hydrochlorothiazide 20-25 MG tablet Commonly known as:  PRINZIDE,ZESTORETIC Take 1  tablet by mouth daily.   multivitamin with minerals Tabs tablet Take 1 tablet by mouth daily. For nutritional supplementation.   nystatin cream Commonly known as:  MYCOSTATIN APPLY TO THE AFFECTED AREA THREE TIMES DAILY AS NEEDED .Marland KitchenFOR TOPICAL USE ONLY...   omeprazole 20 MG capsule Commonly known as:  PRILOSEC TAKE (1) CAPSULE DAILY   ondansetron 4 MG disintegrating tablet Commonly known as:  ZOFRAN ODT Take 1 tablet (4 mg total) by mouth every 8 (eight) hours as needed for nausea or vomiting.   risperiDONE 3 MG tablet Commonly known  as:  RISPERDAL Take 1 tablet (3 mg total) at bedtime by mouth.          Objective:    BP 123/84   Pulse 96   Temp (!) 97.5 F (36.4 C) (Oral)   Ht 5\' 9"  (1.753 m)   Wt (!) 403 lb (182.8 kg)   LMP 02/18/1995   SpO2 98%   BMI 59.51 kg/m   Allergies  Allergen Reactions  . Ativan [Lorazepam] Other (See Comments)    Delirium  . Haldol [Haloperidol] Other (See Comments)    Hallucinating   . Naproxen Nausea And Vomiting    Physical Exam  Constitutional: She is oriented to person, place, and time. She appears well-developed and well-nourished.  HENT:  Head: Normocephalic and atraumatic.  Right Ear: Tympanic membrane and external ear normal. No middle ear effusion.  Left Ear: Tympanic membrane and external ear normal.  No middle ear effusion.  Nose: Mucosal edema and rhinorrhea present. Right sinus exhibits frontal sinus tenderness. Right sinus exhibits no maxillary sinus tenderness. Left sinus exhibits frontal sinus tenderness. Left sinus exhibits no maxillary sinus tenderness.  Mouth/Throat: Uvula is midline. Posterior oropharyngeal erythema present.  Eyes: Conjunctivae and EOM are normal. Pupils are equal, round, and reactive to light. Right eye exhibits no discharge. Left eye exhibits no discharge.  Neck: Normal range of motion.  Cardiovascular: Normal rate, regular rhythm and normal heart sounds.  Pulmonary/Chest: Effort normal and  breath sounds normal. No respiratory distress. She has no wheezes.  Abdominal: Soft.  Lymphadenopathy:    She has no cervical adenopathy.  Neurological: She is alert and oriented to person, place, and time.  Skin: Skin is warm and dry.  Psychiatric: She has a normal mood and affect.        Assessment & Plan:   1. Acute rhinosinusitis - fluticasone (FLONASE) 50 MCG/ACT nasal spray; Place 1 spray 2 (two) times daily as needed into both nostrils for allergies or rhinitis.  Dispense: 16 g; Refill: 6 - methylPREDNISolone acetate (DEPO-MEDROL) injection 80 mg    Current Outpatient Medications:  .  albuterol (PROVENTIL) (2.5 MG/3ML) 0.083% nebulizer solution, Take 3 mLs (2.5 mg total) by nebulization every 4 (four) hours as needed for wheezing or shortness of breath., Disp: 75 mL, Rfl: 0 .  atorvastatin (LIPITOR) 20 MG tablet, TAKE 1 TABLET DAILY, Disp: 90 tablet, Rfl: 0 .  BREO ELLIPTA 100-25 MCG/INH AEPB, INHALE 1 PUFF INTO THE LUNGS DAILY, Disp: 60 each, Rfl: 5 .  celecoxib (CELEBREX) 200 MG capsule, Take 1 capsule by mouth 2 (two) times daily., Disp: , Rfl:  .  cetirizine (ZYRTEC) 10 MG tablet, TAKE 1 TABLET ONCE DAILY, Disp: 30 tablet, Rfl: 11 .  cloNIDine (CATAPRES) 0.1 MG tablet, TAKE ONE TABLET AT BEDTIME, Disp: 30 tablet, Rfl: 0 .  cloNIDine (CATAPRES) 0.2 MG tablet, Take 0.2 mg by mouth at bedtime., Disp: , Rfl:  .  diclofenac sodium (VOLTAREN) 1 % GEL, Apply to affected area twice daily as needed (Patient taking differently: Apply 2 g topically 2 (two) times daily as needed (PAIN). ), Disp: 100 g, Rfl: 5 .  divalproex (DEPAKOTE) 500 MG DR tablet, Take 3 tablets (1,500 mg total) at bedtime by mouth., Disp: 90 tablet, Rfl: 2 .  DULoxetine (CYMBALTA) 60 MG capsule, Take 1 capsule (60 mg total) daily by mouth., Disp: 90 capsule, Rfl: 2 .  fluticasone (FLONASE) 50 MCG/ACT nasal spray, Place 1 spray 2 (two) times daily as needed into both nostrils for allergies or  rhinitis., Disp: 16 g,  Rfl: 6 .  fosfomycin (MONUROL) 3 g PACK, Take 3 g by mouth every 3 days for 3 doses., Disp: 9 g, Rfl: 0 .  levothyroxine (SYNTHROID, LEVOTHROID) 112 MCG tablet, TAKE (1) TABLET DAILY BE- FORE BREAKFAST FOR THYROID, Disp: 90 tablet, Rfl: 0 .  lisinopril-hydrochlorothiazide (PRINZIDE,ZESTORETIC) 20-25 MG tablet, Take 1 tablet by mouth daily., Disp: 30 tablet, Rfl: 0 .  lisinopril-hydrochlorothiazide (PRINZIDE,ZESTORETIC) 20-25 MG tablet, Take 1 tablet by mouth daily., Disp: 30 tablet, Rfl: 0 .  Multiple Vitamin (MULTIVITAMIN WITH MINERALS) TABS, Take 1 tablet by mouth daily. For nutritional supplementation., Disp: 30 tablet, Rfl: 0 .  nystatin cream (MYCOSTATIN), APPLY TO THE AFFECTED AREA THREE TIMES DAILY AS NEEDED .Marland KitchenFOR TOPICAL USE ONLY..., Disp: 30 g, Rfl: 0 .  omeprazole (PRILOSEC) 20 MG capsule, TAKE (1) CAPSULE DAILY, Disp: 30 capsule, Rfl: 0 .  ondansetron (ZOFRAN ODT) 4 MG disintegrating tablet, Take 1 tablet (4 mg total) by mouth every 8 (eight) hours as needed for nausea or vomiting., Disp: 20 tablet, Rfl: 0 .  PROAIR HFA 108 (90 Base) MCG/ACT inhaler, USE 2 PUFF EVERY 4 HOURS AS NEEDED FOR WHEEZING OR SHORTNESS OF BREATH, Disp: 8.5 g, Rfl: 5 .  risperiDONE (RISPERDAL) 3 MG tablet, Take 1 tablet (3 mg total) at bedtime by mouth., Disp: 30 tablet, Rfl: 2 .  levofloxacin (LEVAQUIN) 500 MG tablet, Take 1 tablet (500 mg total) daily by mouth., Disp: 10 tablet, Rfl: 0  Current Facility-Administered Medications:  .  methylPREDNISolone acetate (DEPO-MEDROL) injection 80 mg, 80 mg, Intramuscular, Once, Carreen Milius S, PA-C Continue all other maintenance medications as listed above.  Follow up plan: Follow-up as needed or worsening of symptoms. Call office for any issues.   Educational handout given for [place] PA-C [facility] [address]  [place], [place] [address] [phone]   04/16/2017, 6:23 PM

## 2017-04-20 ENCOUNTER — Other Ambulatory Visit: Payer: Self-pay | Admitting: Family Medicine

## 2017-05-03 ENCOUNTER — Ambulatory Visit: Payer: Medicare Other | Admitting: Nurse Practitioner

## 2017-05-04 ENCOUNTER — Ambulatory Visit (INDEPENDENT_AMBULATORY_CARE_PROVIDER_SITE_OTHER): Payer: Medicare Other | Admitting: Family Medicine

## 2017-05-04 ENCOUNTER — Encounter: Payer: Self-pay | Admitting: Family Medicine

## 2017-05-04 VITALS — BP 124/83 | HR 90 | Temp 97.2°F | Ht 69.0 in | Wt 394.0 lb

## 2017-05-04 DIAGNOSIS — G4733 Obstructive sleep apnea (adult) (pediatric): Secondary | ICD-10-CM | POA: Diagnosis not present

## 2017-05-04 DIAGNOSIS — Z9989 Dependence on other enabling machines and devices: Secondary | ICD-10-CM | POA: Diagnosis not present

## 2017-05-04 DIAGNOSIS — R5383 Other fatigue: Secondary | ICD-10-CM | POA: Diagnosis not present

## 2017-05-04 NOTE — Progress Notes (Signed)
Subjective: CC: "no energy" PCP: Dettinger, Fransisca Kaufmann, MD MVE:[facility] Allison Sharp is a 61 y.o. female presenting to clinic today for:  1. Fatigue Patient reports that she has had decreased energy/fatigue for the last 2 weeks.  If family members here with her who notes that she seems to be generally weak, requiring extra help for ADLs like bathing.  She notes that she is easily dyspneic on exertion.  Patient denies shortness of breath at rest.  She does have OSA and wears a CPAP but has not had titration of the CPAP in greater than 2 years.  She does wonder if this is contributing.  She was seen on November 19 for rhinosinusitis and treated with medications.  She notes that symptoms did not improve after this.  She notes decreased appetite.  Denies fevers, chills, nausea, vomiting.  No increased lower extremity edema.  She uses a cane for walking.  She has not been bedridden and actually has been trying to stay active in efforts to improve symptoms.  No bleeding from anywhere.   ROS: Per HPI  Allergies  Allergen Reactions  . Ativan [Lorazepam] Other (See Comments)    Delirium  . Haldol [Haloperidol] Other (See Comments)    Hallucinating   . Naproxen Nausea And Vomiting   Past Medical History:  Diagnosis Date  . Allergy   . Anxiety   . Arthritis   . Asthma   . Bipolar 1 disorder ([place])   . CAD (coronary artery disease)   . Cellulitis   . CHF (congestive heart failure) ([facility])    diastolic dysfunction  . Chronic back pain   . Chronic headaches   . Complication of anesthesia    States she typically gets sick s/p anesthesia  . Contusion of sacrum   . COPD (chronic obstructive pulmonary disease) ([place])   . Depression   . Dyspnea    PFT 03/05/09 FEV1 2.77(98%), FVC 3.25(86%), FEV1% 85, TLC 5.88(99%), DLCO 60% ,  Methacholine challenge 03/16/09 normal ,  CT chest 03/12/09 no pulmonary disease  . Fungal infection   . GERD (gastroesophageal reflux disease)   . History of colonoscopy  10/17/2002   by Dr Rehman-> distal non-specific proctitis, small ext hemorrhoids,   . HTN (hypertension)   . Hyperlipidemia   . Hyperthyroidism   . Hypothyroidism    States she only has hyperthyroidism  . Migraine headache   . Morbid obesity with body mass index of 50.0-59.9 in adult [facility]) JAN 2011 370 LBS   2004 311 BMI 45.9  . Myocardial infarction ([place])    NOV 1997  . OSA on CPAP    she had been on 2L O2 at night but that was stopped  . PONV (postoperative nausea and vomiting)   . PTSD (post-traumatic stress disorder)   . Suicidal ideation   . Urine incontinence   . Vitamin D deficiency     Current Outpatient Medications:  .  albuterol (PROVENTIL) (2.5 MG/3ML) 0.083% nebulizer solution, Take 3 mLs (2.5 mg total) by nebulization every 4 (four) hours as needed for wheezing or shortness of breath., Disp: 75 mL, Rfl: 0 .  atorvastatin (LIPITOR) 20 MG tablet, TAKE 1 TABLET DAILY, Disp: 90 tablet, Rfl: 0 .  BREO ELLIPTA 100-25 MCG/INH AEPB, INHALE 1 PUFF INTO THE LUNGS DAILY, Disp: 60 each, Rfl: 5 .  celecoxib (CELEBREX) 200 MG capsule, Take 1 capsule by mouth 2 (two) times daily., Disp: , Rfl:  .  cetirizine (ZYRTEC) 10 MG tablet, TAKE 1  TABLET ONCE DAILY, Disp: 30 tablet, Rfl: 11 .  cloNIDine (CATAPRES) 0.1 MG tablet, TAKE ONE TABLET AT BEDTIME, Disp: 30 tablet, Rfl: 0 .  cloNIDine (CATAPRES) 0.2 MG tablet, Take 0.2 mg by mouth at bedtime., Disp: , Rfl:  .  diclofenac sodium (VOLTAREN) 1 % GEL, Apply to affected area twice daily as needed (Patient taking differently: Apply 2 g topically 2 (two) times daily as needed (PAIN). ), Disp: 100 g, Rfl: 5 .  divalproex (DEPAKOTE) 500 MG DR tablet, Take 3 tablets (1,500 mg total) at bedtime by mouth., Disp: 90 tablet, Rfl: 2 .  DULoxetine (CYMBALTA) 60 MG capsule, Take 1 capsule (60 mg total) daily by mouth., Disp: 90 capsule, Rfl: 2 .  fluticasone (FLONASE) 50 MCG/ACT nasal spray, Place 1 spray 2 (two) times daily as needed into both nostrils  for allergies or rhinitis., Disp: 16 g, Rfl: 6 .  fosfomycin (MONUROL) 3 g PACK, Take 3 g by mouth every 3 days for 3 doses., Disp: 9 g, Rfl: 0 .  levofloxacin (LEVAQUIN) 500 MG tablet, Take 1 tablet (500 mg total) daily by mouth., Disp: 10 tablet, Rfl: 0 .  levothyroxine (SYNTHROID, LEVOTHROID) 112 MCG tablet, TAKE (1) TABLET DAILY BE- FORE BREAKFAST FOR THYROID, Disp: 90 tablet, Rfl: 0 .  lisinopril-hydrochlorothiazide (PRINZIDE,ZESTORETIC) 20-25 MG tablet, Take 1 tablet by mouth daily., Disp: 30 tablet, Rfl: 0 .  lisinopril-hydrochlorothiazide (PRINZIDE,ZESTORETIC) 20-25 MG tablet, Take 1 tablet by mouth daily., Disp: 30 tablet, Rfl: 0 .  Multiple Vitamin (MULTIVITAMIN WITH MINERALS) TABS, Take 1 tablet by mouth daily. For nutritional supplementation., Disp: 30 tablet, Rfl: 0 .  nystatin cream (MYCOSTATIN), APPLY TO THE AFFECTED AREA THREE TIMES DAILY AS NEEDED .Marland KitchenFOR TOPICAL USE ONLY..., Disp: 30 g, Rfl: 0 .  omeprazole (PRILOSEC) 20 MG capsule, TAKE (1) CAPSULE DAILY, Disp: 30 capsule, Rfl: 0 .  ondansetron (ZOFRAN ODT) 4 MG disintegrating tablet, Take 1 tablet (4 mg total) by mouth every 8 (eight) hours as needed for nausea or vomiting., Disp: 20 tablet, Rfl: 0 .  PROAIR HFA 108 (90 Base) MCG/ACT inhaler, USE 2 PUFF EVERY 4 HOURS AS NEEDED FOR WHEEZING OR SHORTNESS OF BREATH, Disp: 8.5 g, Rfl: 5 .  risperiDONE (RISPERDAL) 3 MG tablet, Take 1 tablet (3 mg total) at bedtime by mouth., Disp: 30 tablet, Rfl: 2 Social History   Socioeconomic History  . Marital status: Single    Spouse name: Not on file  . Number of children: 2  . Years of education: Not on file  . Highest education level: Not on file  Social Needs  . Financial resource strain: Not on file  . Food insecurity - worry: Not on file  . Food insecurity - inability: Not on file  . Transportation needs - medical: Not on file  . Transportation needs - non-medical: Not on file  Occupational History  . Occupation: Disabled     Fish farm manager: UNEMPLOYED    Comment: back problems  Tobacco Use  . Smoking status: Never Smoker  . Smokeless tobacco: Never Used  Substance and Sexual Activity  . Alcohol use: No    Alcohol/week: 0.0 oz  . Drug use: No  . Sexual activity: No    Birth control/protection: Abstinence  Other Topics Concern  . Not on file  Social History Narrative   [name] is disabled and lives with her sister, Jazzmen. She has another sister with whom she has lived with in the past, and who is a Designer, jewellery, but not currently  practicing.   She has two grown sons that she does not see often, as they live in other states. She has 5 grand children.   [name] has a long history of mental illness including depression, PTSD, suicidal and homicidal ideation.   She has been obese most all of her life. Her weight has significantly impacted her QOL. She recently lost 20 lbs. By decreasing portion size & increasing proteins.    Family History  Problem Relation Age of Onset  . Hypertension Mother   . Bipolar disorder Mother   . Dementia Mother   . Depression Mother   . Heart attack Mother   . AAA (abdominal aortic aneurysm) Mother   . Coronary artery disease Father   . Alcohol abuse Father   . Hypertension Brother   . Coronary artery disease Brother   . Bipolar disorder Brother   . Depression Brother   . Depression Sister   . Paranoid behavior Sister   . Cancer Sister        breast  . Bipolar disorder Sister   . Depression Sister   . Hypertension Sister   . Cancer Son        thyroid  . Cancer Maternal Aunt        breast metastatized to brain  . Anesthesia problems Neg Hx   . Hypotension Neg Hx   . Malignant hyperthermia Neg Hx   . Pseudochol deficiency Neg Hx     Objective: Office vital signs reviewed. BP 124/83   Pulse 90   Temp (!) 97.2 F (36.2 C) (Oral)   Ht _0  (1.753 m)   Wt (!) 394 lb (178.7 kg)   LMP 02/18/1995   BMI 58.18 kg/m   Physical Examination:  General: Awake,  alert, morbidly obese, No acute distress HEENT: Normal    Neck: No masses palpated. No lymphadenopathy.  No JVD    Ears: Tympanic membranes intact, normal light reflex, no erythema, no bulging    Eyes: PERRLA, extraocular membranes intact, sclera white    Nose: nasal turbinates moist, no nasal discharge    Throat: moist mucus membranes, no erythema, no tonsillar exudate.  Airway is patent Cardio: regular rate and rhythm, S1S2 heard, no murmurs appreciated Pulm: clear to auscultation bilaterally, no wheezes, rhonchi or rales; normal work of breathing on room air MSK: 5/5 UE and LE strength.  Extremities: Warm, +2 distal pulses.  Trace dependent edema appreciated  Assessment/ Plan: 61 y.o. female   1. Fatigue, unspecified type May be related to recent upper respiratory infection versus insufficiently treated OSA versus physical deconditioning secondary to morbid obesity and chronic illness versus uncontrolled hypothyroidism (no thyroid labs since 2017) versus new onset heart failure (November 2017 echo was reviewed which did not reveal systolic dysfunction).  I reviewed with patient that etiology of symptoms is somewhat uncertain.  CMP, TSH, CBC with differential was obtained today.  I ordered a split-night study to titrate her current CPAP settings.  I will contact patient once the results of her labs are available.  I did encourage her to call her cardiologist to schedule an appointment as she has not seen her in greater than 1 year.  If labs come back unremarkable, would consider physical therapy for physical rehabilitation.  I suspect that her morbid obesity does play a part in her symptoms. Strict return precautions and reasons for emergent evaluation in the emergency department review with patient.  They voiced understanding and will follow-up as needed. - CMP14+EGFR -  TSH - CBC with Differential - Split night study; Future  2. OSA on CPAP - Split night study; Future   Orders Placed  This Encounter  Procedures  . CMP14+EGFR  . TSH  . CBC with Differential  . Split night study    Standing Status:   Future    Standing Expiration Date:   05/04/2018    Order Specific Question:   Where should this test be performed:    Answer:   [place], [place] [phone]

## 2017-05-04 NOTE — Patient Instructions (Addendum)
As we discussed, I have ordered several labs to evaluate your fatigue.  Once these are available, I will call you with the results.  I would like you to follow-up with your cardiologist with regards to this shortness of breath with exertion.  You may need a special ultrasound of your heart to further evaluate this.  I have placed a referral for a new sleep study to update your CPAP settings.  If you do not hear by this time next week for an appointment, please call us.   Fatigue Fatigue is feeling tired all of the time, a lack of energy, or a lack of motivation. Occasional or mild fatigue is often a normal response to activity or life in general. However, long-lasting (chronic) or extreme fatigue may indicate an underlying medical condition. Follow these instructions at home: Watch your fatigue for any changes. The following actions may help to lessen any discomfort you are feeling:  Talk to your health care provider about how much sleep you need each night. Try to get the required amount every night.  Take medicines only as directed by your health care provider.  Eat a healthy and nutritious diet. Ask your health care provider if you need help changing your diet.  Drink enough fluid to keep your urine clear or pale yellow.  Practice ways of relaxing, such as yoga, meditation, massage therapy, or acupuncture.  Exercise regularly.  Change situations that cause you stress. Try to keep your work and personal routine reasonable.  Do not abuse illegal drugs.  Limit alcohol intake to no more than 1 drink per day for nonpregnant women and 2 drinks per day for men. One drink equals 12 ounces of beer, 5 ounces of wine, or 1 ounces of hard liquor.  Take a multivitamin, if directed by your health care provider.  Contact a health care provider if:  Your fatigue does not get better.  You have a fever.  You have unintentional weight loss or gain.  You have headaches.  You have  difficulty: ? Falling asleep. ? Sleeping throughout the night.  You feel angry, guilty, anxious, or sad.  You are unable to have a bowel movement (constipation).  You skin is dry.  Your legs or another part of your body is swollen. Get help right away if:  You feel confused.  Your vision is blurry.  You feel faint or pass out.  You have a severe headache.  You have severe abdominal, pelvic, or back pain.  You have chest pain, shortness of breath, or an irregular or fast heartbeat.  You are unable to urinate or you urinate less than normal.  You develop abnormal bleeding, such as bleeding from the rectum, vagina, nose, lungs, or nipples.  You vomit blood.  You have thoughts about harming yourself or committing suicide.  You are worried that you might harm someone else. This information is not intended to replace advice given to you by your health care provider. Make sure you discuss any questions you have with your health care provider. Document Released: 03/12/2007 Document Revised: 10/21/2015 Document Reviewed: 09/16/2013 Elsevier Interactive Patient Education  [employer].

## 2017-05-05 ENCOUNTER — Other Ambulatory Visit: Payer: Self-pay | Admitting: Family Medicine

## 2017-05-05 LAB — CBC WITH DIFFERENTIAL/PLATELET
BASOS: 0 %
Basophils Absolute: 0 10*3/uL (ref 0.0–0.2)
EOS (ABSOLUTE): 0.1 10*3/uL (ref 0.0–0.4)
EOS: 1 %
HEMATOCRIT: 39.3 % (ref 34.0–46.6)
Hemoglobin: 13.8 g/dL (ref 11.1–15.9)
Immature Grans (Abs): 0 10*3/uL (ref 0.0–0.1)
Immature Granulocytes: 0 %
LYMPHS ABS: 2.3 10*3/uL (ref 0.7–3.1)
Lymphs: 24 %
MCH: 31.9 pg (ref 26.6–33.0)
MCHC: 35.1 g/dL (ref 31.5–35.7)
MCV: 91 fL (ref 79–97)
MONOS ABS: 0.8 10*3/uL (ref 0.1–0.9)
Monocytes: 8 %
Neutrophils Absolute: 6.6 10*3/uL (ref 1.4–7.0)
Neutrophils: 67 %
PLATELETS: 119 10*3/uL — AB (ref 150–379)
RBC: 4.33 x10E6/uL (ref 3.77–5.28)
RDW: 14.2 % (ref 12.3–15.4)
WBC: 9.8 10*3/uL (ref 3.4–10.8)

## 2017-05-05 LAB — TSH: TSH: 1.95 u[IU]/mL (ref 0.450–4.500)

## 2017-05-05 LAB — CMP14+EGFR
A/G RATIO: 1.6 (ref 1.2–2.2)
ALT: 7 IU/L (ref 0–32)
AST: 10 IU/L (ref 0–40)
Albumin: 4.1 g/dL (ref 3.6–4.8)
Alkaline Phosphatase: 71 IU/L (ref 39–117)
BILIRUBIN TOTAL: 0.4 mg/dL (ref 0.0–1.2)
BUN/Creatinine Ratio: 17 (ref 12–28)
BUN: 15 mg/dL (ref 8–27)
CALCIUM: 9.2 mg/dL (ref 8.7–10.3)
CHLORIDE: 101 mmol/L (ref 96–106)
CO2: 24 mmol/L (ref 20–29)
Creatinine, Ser: 0.88 mg/dL (ref 0.57–1.00)
GFR calc Af Amer: 81 mL/min/{1.73_m2} (ref >59)
GFR, EST NON AFRICAN AMERICAN: 70 mL/min/{1.73_m2} (ref >59)
GLOBULIN, TOTAL: 2.5 g/dL (ref 1.5–4.5)
Glucose: 98 mg/dL (ref 65–99)
POTASSIUM: 4.3 mmol/L (ref 3.5–5.2)
SODIUM: 140 mmol/L (ref 134–144)
Total Protein: 6.6 g/dL (ref 6.0–8.5)

## 2017-05-12 ENCOUNTER — Telehealth: Payer: Self-pay | Admitting: Family Medicine

## 2017-05-14 DIAGNOSIS — I10 Essential (primary) hypertension: Secondary | ICD-10-CM | POA: Diagnosis not present

## 2017-05-14 DIAGNOSIS — I252 Old myocardial infarction: Secondary | ICD-10-CM | POA: Diagnosis not present

## 2017-05-14 DIAGNOSIS — Z886 Allergy status to analgesic agent status: Secondary | ICD-10-CM | POA: Diagnosis not present

## 2017-05-14 DIAGNOSIS — Z79899 Other long term (current) drug therapy: Secondary | ICD-10-CM | POA: Diagnosis not present

## 2017-05-14 DIAGNOSIS — J449 Chronic obstructive pulmonary disease, unspecified: Secondary | ICD-10-CM | POA: Diagnosis not present

## 2017-05-14 DIAGNOSIS — R1084 Generalized abdominal pain: Secondary | ICD-10-CM | POA: Diagnosis not present

## 2017-05-14 DIAGNOSIS — D3502 Benign neoplasm of left adrenal gland: Secondary | ICD-10-CM | POA: Diagnosis not present

## 2017-05-14 DIAGNOSIS — D72829 Elevated white blood cell count, unspecified: Secondary | ICD-10-CM | POA: Diagnosis not present

## 2017-05-14 DIAGNOSIS — N200 Calculus of kidney: Secondary | ICD-10-CM | POA: Diagnosis not present

## 2017-05-14 DIAGNOSIS — K760 Fatty (change of) liver, not elsewhere classified: Secondary | ICD-10-CM | POA: Diagnosis not present

## 2017-05-14 DIAGNOSIS — R109 Unspecified abdominal pain: Secondary | ICD-10-CM | POA: Diagnosis not present

## 2017-05-14 DIAGNOSIS — Z791 Long term (current) use of non-steroidal anti-inflammatories (NSAID): Secondary | ICD-10-CM | POA: Diagnosis not present

## 2017-05-14 DIAGNOSIS — R1030 Lower abdominal pain, unspecified: Secondary | ICD-10-CM | POA: Diagnosis not present

## 2017-05-14 DIAGNOSIS — E039 Hypothyroidism, unspecified: Secondary | ICD-10-CM | POA: Diagnosis not present

## 2017-05-14 DIAGNOSIS — Z888 Allergy status to other drugs, medicaments and biological substances status: Secondary | ICD-10-CM | POA: Diagnosis not present

## 2017-05-14 DIAGNOSIS — R110 Nausea: Secondary | ICD-10-CM | POA: Diagnosis not present

## 2017-05-14 DIAGNOSIS — R1010 Upper abdominal pain, unspecified: Secondary | ICD-10-CM | POA: Diagnosis not present

## 2017-05-14 DIAGNOSIS — K5900 Constipation, unspecified: Secondary | ICD-10-CM | POA: Diagnosis not present

## 2017-05-14 IMAGING — DX DG CHEST 2V
2 series · 2 of 2 positions shown · non-contrast
Comparison: 11/04/2012

CLINICAL DATA: Acute chest pain and hypertension

EXAM:
CHEST  2 VIEW

[chest pa]
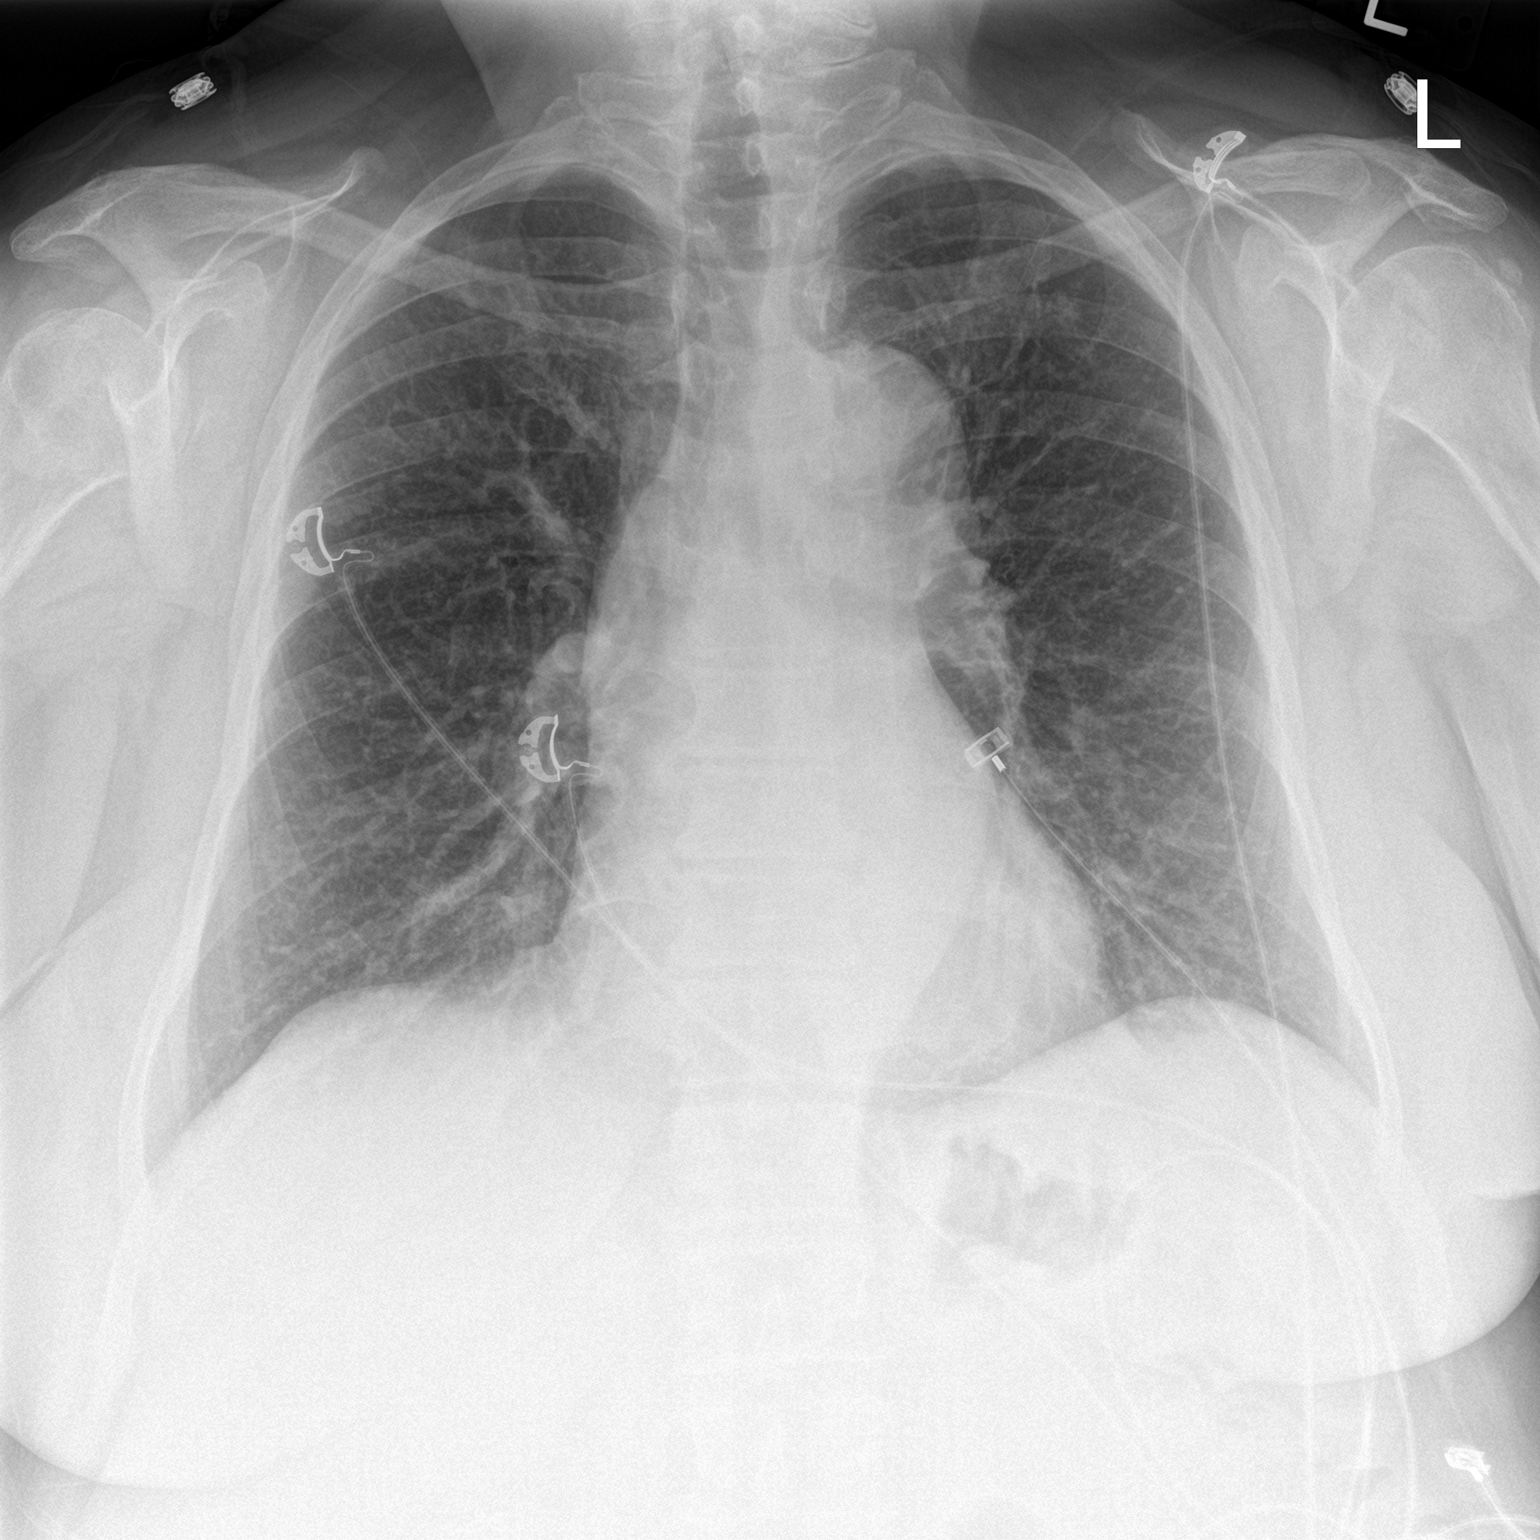

[chest lat]
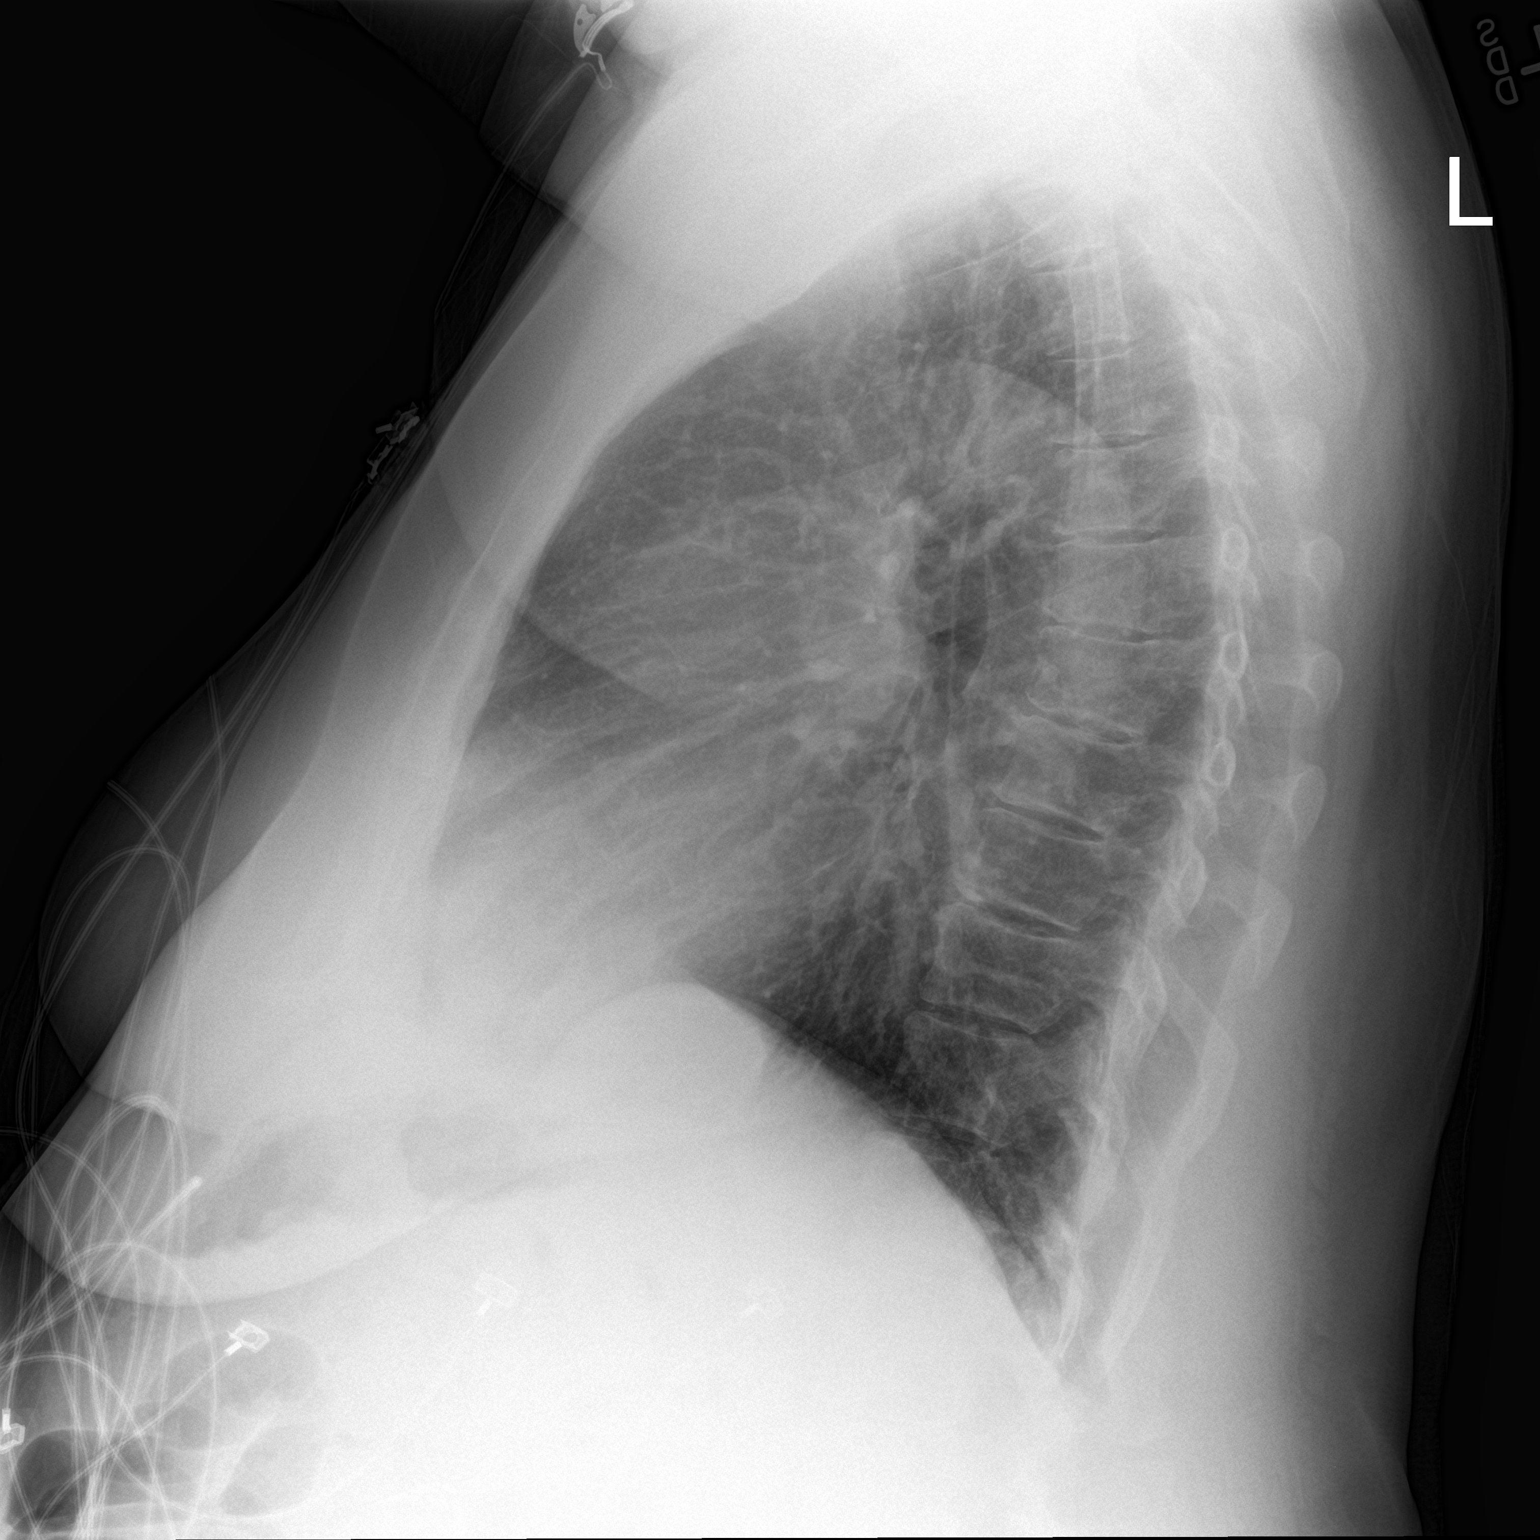

[2 of 2 positions shown; findings below may reference images not displayed]

FINDINGS: Normal heart size and vascularity. No focal pneumonia, collapse or
consolidation. Negative for edema, effusion or pneumothorax. Stable
atherosclerosis and mild tortuosity of aorta. Small left upper lobe
10 mm nodular density versus nodule. This appears new compared
11/04/2012. Recommend follow-up nonemergent chest CT.

Trachea is midline. Degenerative changes of the spine. Monitor leads
overlie the chest.
IMPRESSION: Stable chest exam without acute process

10 mm left upper lobe nodule. Recommend follow-up nonemergent chest
CT.

## 2017-05-14 NOTE — Telephone Encounter (Signed)
Aware of lab results.  She does not use My Chart so she did not get results.

## 2017-05-17 ENCOUNTER — Other Ambulatory Visit: Payer: Self-pay | Admitting: Family Medicine

## 2017-05-19 IMAGING — CT CT CHEST W/ CM
2 of 3 series · 15 of 36 positions shown, 18 images · IV contrast (Omnipaque 300)
Comparison: None.

CLINICAL DATA: History lung nodule.  Shortness of breath.

EXAM:
CT CHEST WITH CONTRAST
TECHNIQUE: Multidetector CT imaging of the chest was performed during
intravenous contrast administration.
CONTRAST:  75mL OMNIPAQUE IOHEXOL 300 MG/ML  SOLN

[Series 2: chestroutine 5.0 b40f · axial · 0.73mm/px · z∈[+812,+1116]mm · 12 of 73 slices shown, 15 images]
[im 6/73  mediastinal]
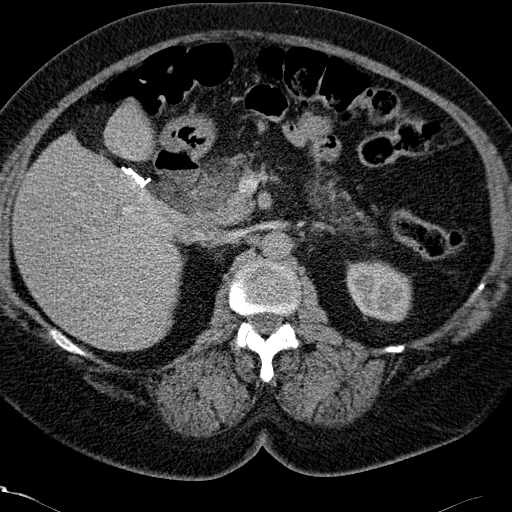
[im 6/73  lung]
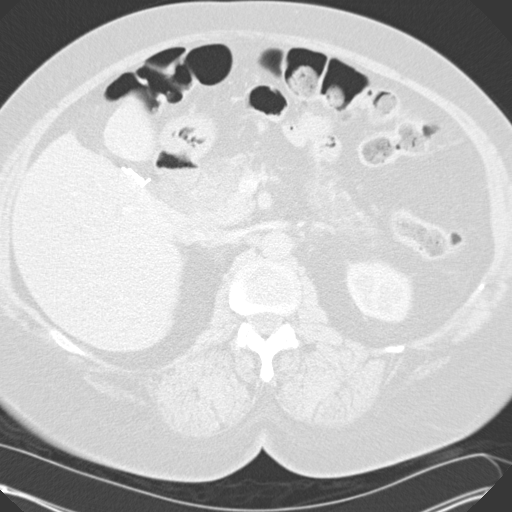
[im 11/73  lung]
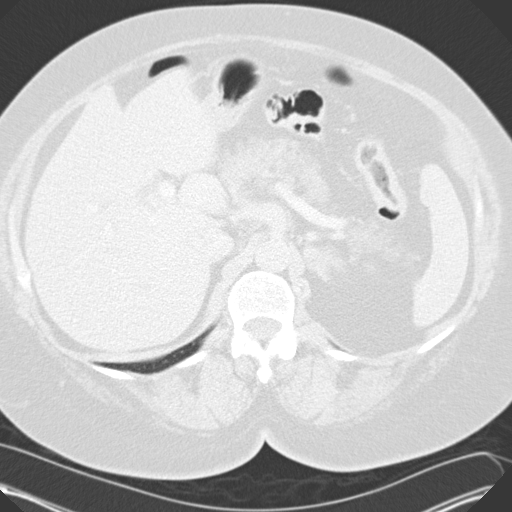
[im 17/73  lung]
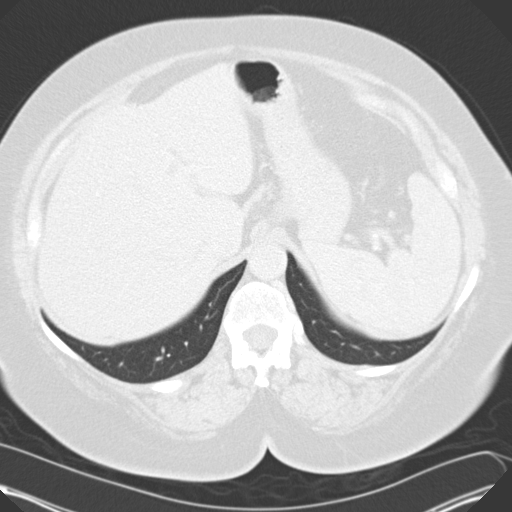
[im 22/73  lung]
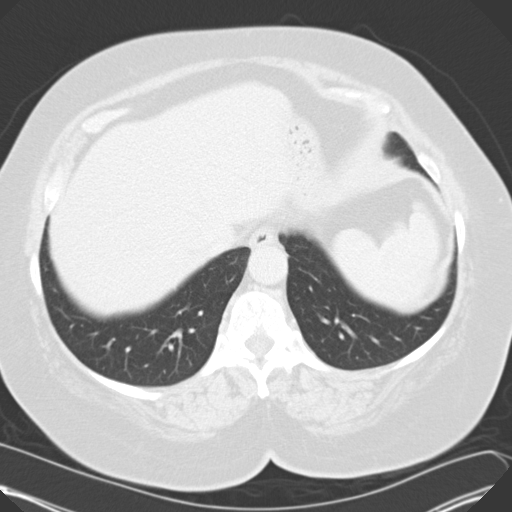
[im 27/73  mediastinal]
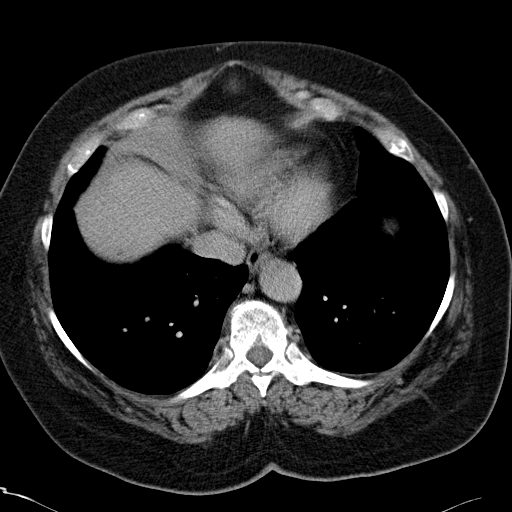
[im 27/73  lung]
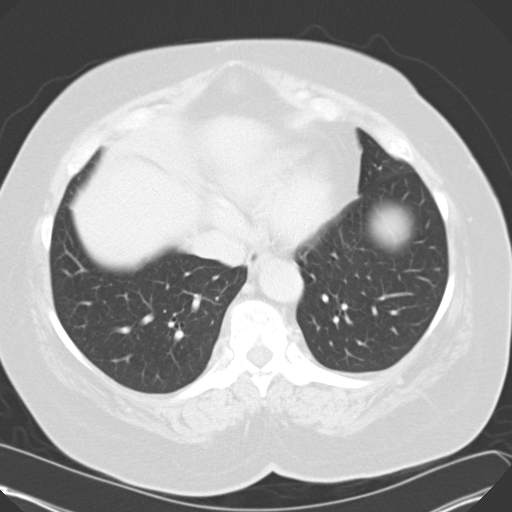
[im 33/73  lung]
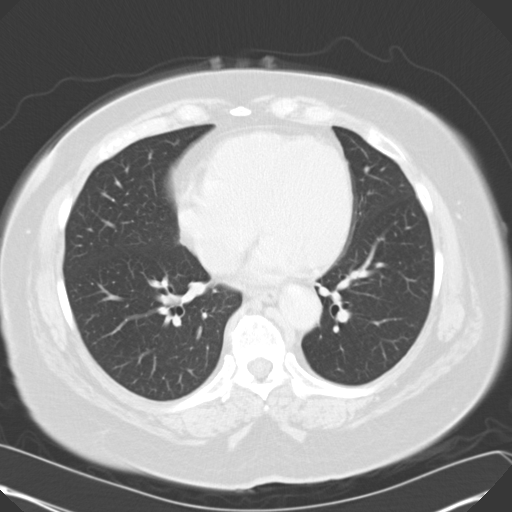
[im 41/73  lung]
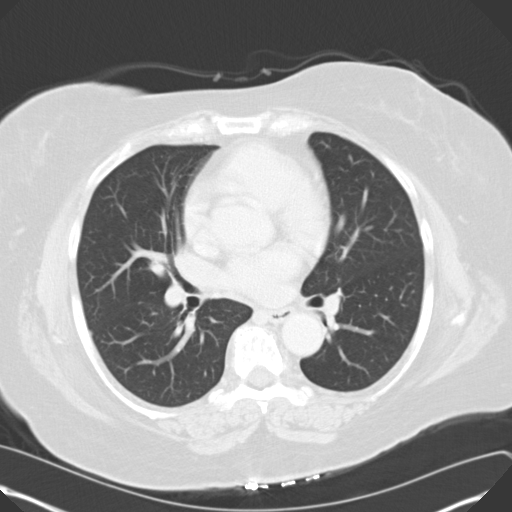
[im 46/73  lung]
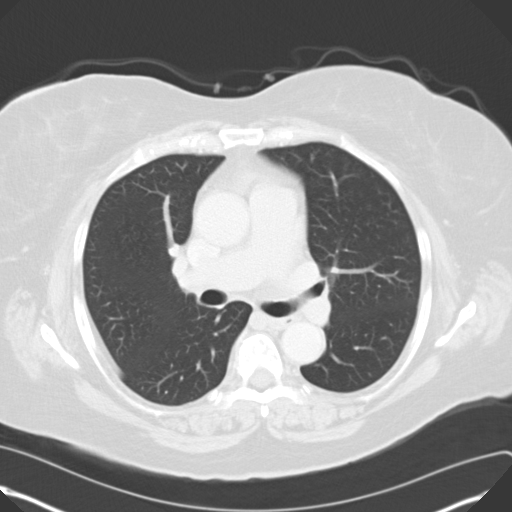
[im 51/73  mediastinal]
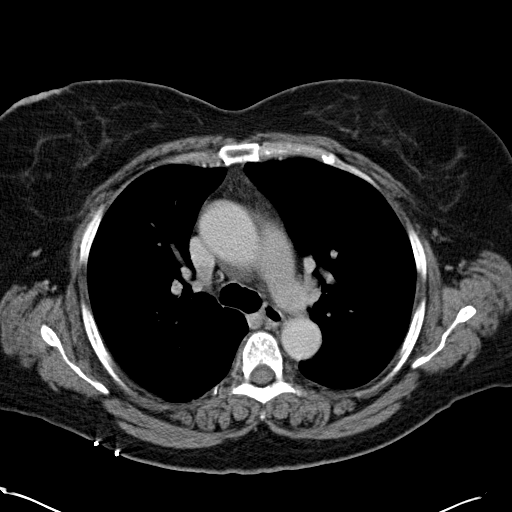
[im 51/73  lung]
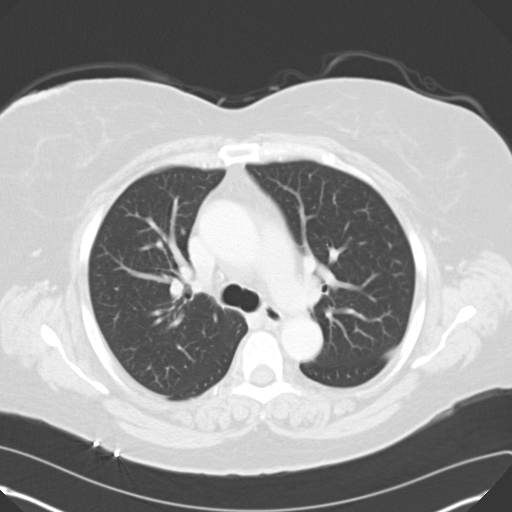
[im 57/73  lung]
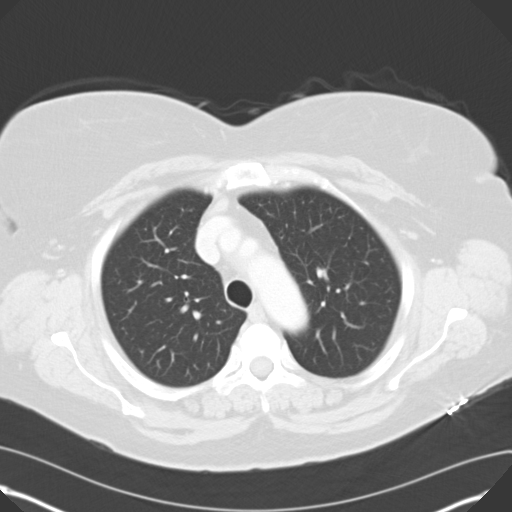
[im 62/73  lung]
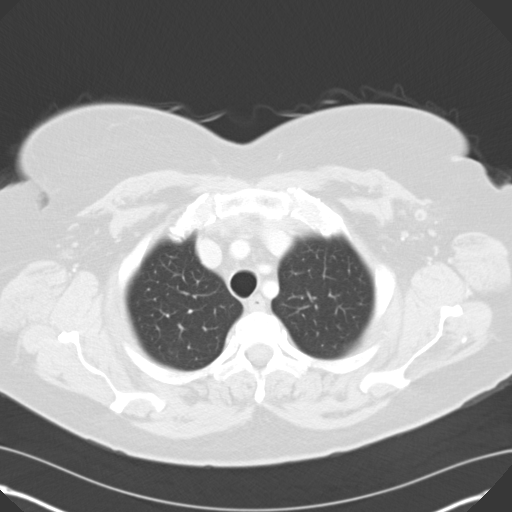
[im 67/73  lung]
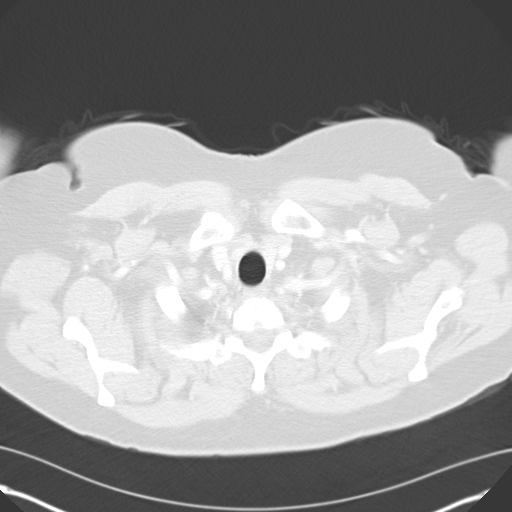

[Series 4: mpr coronal chest 3mm · coronal · 0.76mm/px · 3 of 108 slices shown]
[im 22/108  lung]
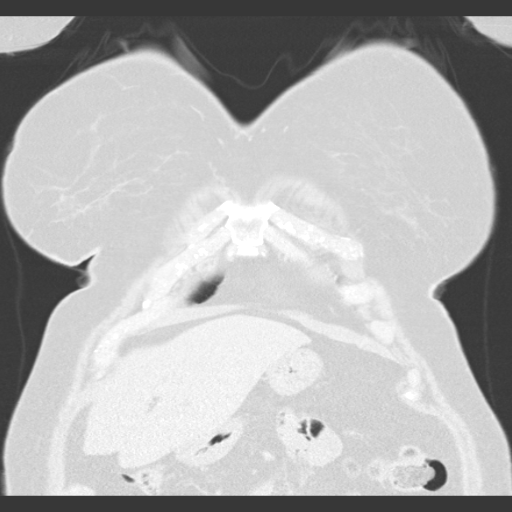
[im 43/108  lung]
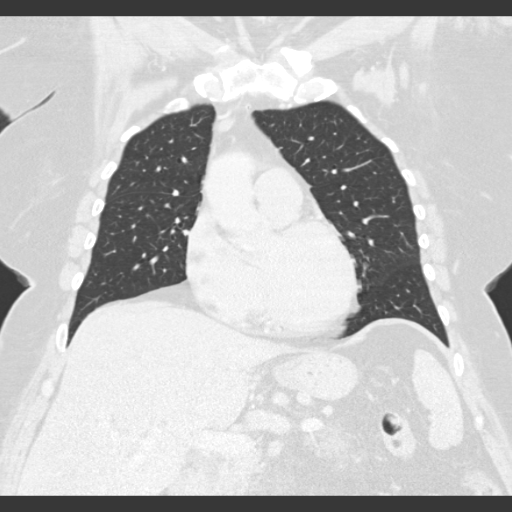
[im 65/108  lung]
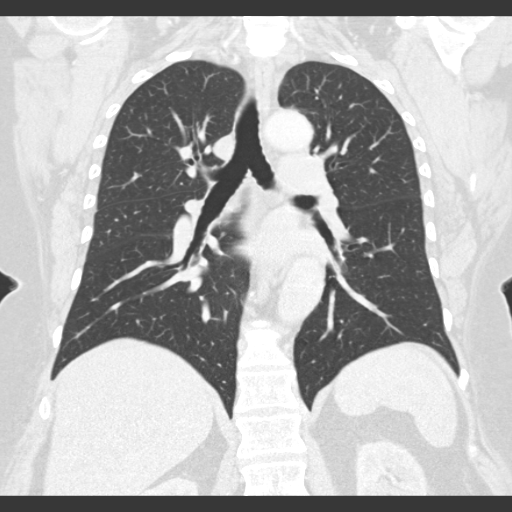

[15 of 36 positions shown; findings below may reference images not displayed]

FINDINGS: Mediastinum/Nodes: No lymphadenopathy. Normal heart size. No
pericardial effusion. Normal caliber thoracic aorta. No mediastinal
hematoma. Normal esophagus.

Lungs/Pleura: No focal consolidation, pleural effusion or
pneumothorax.

Upper abdomen: Relative low attenuation in the region of the
pancreatic head and body relative to the more proximal pancreatic
head which may reflect fatty atrophy versus pancreatitis.

Musculoskeletal: No acute osseous abnormality. No lytic or sclerotic
osseous lesion.
IMPRESSION: 1. No pulmonary mass or pulmonary nodule.
2. Relative low attenuation in the region of the pancreatic head and
body relative to the more proximal pancreatic head which may reflect
fatty atrophy versus pancreatitis. Correlate with lipase levels.

## 2017-06-01 ENCOUNTER — Other Ambulatory Visit: Payer: Self-pay | Admitting: Family Medicine

## 2017-06-04 ENCOUNTER — Telehealth: Payer: Self-pay

## 2017-06-04 NOTE — Telephone Encounter (Signed)
Okay have some to go ahead and put the order in

## 2017-06-04 NOTE — Telephone Encounter (Signed)
Received an order for split sleep study  Tech said no break in CPAP therapy so patient just needs an order put in for CPAP titration to [facility] sleep

## 2017-06-06 ENCOUNTER — Ambulatory Visit: Payer: Medicare Other | Admitting: Family Medicine

## 2017-06-07 ENCOUNTER — Other Ambulatory Visit: Payer: Self-pay | Admitting: *Deleted

## 2017-06-07 MED ORDER — POLYETHYLENE GLYCOL 3350 17 GM/SCOOP PO POWD: 17.0000 g | Freq: Two times a day (BID) | ORAL | 1 refills | Status: DC | PRN

## 2017-06-07 NOTE — Addendum Note (Signed)
Addended by: Marylin Crosby on: 06/07/2017 02:22 PM   Modules accepted: Orders

## 2017-06-07 NOTE — Telephone Encounter (Signed)
Go ahead and put in the refill for MiraLAX for her

## 2017-06-07 NOTE — Telephone Encounter (Signed)
Fax received Refill request Polyethyylene Glycol 3350   225 gm mix & drink 17 grams w/ 8oz water TID daily for constipation Not on current med list Please advise and sent to [facility]

## 2017-06-26 ENCOUNTER — Other Ambulatory Visit (HOSPITAL_COMMUNITY): Payer: Self-pay | Admitting: Psychiatry

## 2017-06-26 DIAGNOSIS — F3160 Bipolar disorder, current episode mixed, unspecified: Secondary | ICD-10-CM

## 2017-06-26 DIAGNOSIS — F3131 Bipolar disorder, current episode depressed, mild: Secondary | ICD-10-CM

## 2017-06-29 ENCOUNTER — Other Ambulatory Visit: Payer: Self-pay | Admitting: Family Medicine

## 2017-06-29 DIAGNOSIS — E785 Hyperlipidemia, unspecified: Secondary | ICD-10-CM

## 2017-07-02 ENCOUNTER — Other Ambulatory Visit (HOSPITAL_COMMUNITY): Payer: Self-pay | Admitting: Psychiatry

## 2017-07-02 ENCOUNTER — Ambulatory Visit (INDEPENDENT_AMBULATORY_CARE_PROVIDER_SITE_OTHER): Payer: Medicare Other | Admitting: Family Medicine

## 2017-07-02 ENCOUNTER — Encounter: Payer: Self-pay | Admitting: Family Medicine

## 2017-07-02 VITALS — BP 119/81 | HR 105 | Temp 98.0°F | Ht 69.0 in | Wt >= 6400 oz

## 2017-07-02 DIAGNOSIS — G8929 Other chronic pain: Secondary | ICD-10-CM

## 2017-07-02 DIAGNOSIS — M545 Low back pain, unspecified: Secondary | ICD-10-CM

## 2017-07-02 DIAGNOSIS — R531 Weakness: Secondary | ICD-10-CM

## 2017-07-02 DIAGNOSIS — R5381 Other malaise: Secondary | ICD-10-CM | POA: Diagnosis not present

## 2017-07-02 DIAGNOSIS — F3131 Bipolar disorder, current episode depressed, mild: Secondary | ICD-10-CM

## 2017-07-02 DIAGNOSIS — Z9181 History of falling: Secondary | ICD-10-CM

## 2017-07-02 DIAGNOSIS — D696 Thrombocytopenia, unspecified: Secondary | ICD-10-CM | POA: Diagnosis not present

## 2017-07-02 DIAGNOSIS — F3160 Bipolar disorder, current episode mixed, unspecified: Secondary | ICD-10-CM

## 2017-07-02 DIAGNOSIS — F411 Generalized anxiety disorder: Secondary | ICD-10-CM | POA: Diagnosis not present

## 2017-07-02 MED ORDER — HYDROXYZINE HCL 50 MG PO TABS: 50.0000 mg | ORAL_TABLET | Freq: Three times a day (TID) | ORAL | 0 refills | Status: DC | PRN

## 2017-07-02 MED ORDER — NAPROXEN 500 MG PO TABS: 500.0000 mg | ORAL_TABLET | Freq: Two times a day (BID) | ORAL | 3 refills | Status: DC

## 2017-07-02 NOTE — Progress Notes (Signed)
BP 119/81   Pulse (!) 105   Temp 98 F (36.7 C) (Oral)   Ht 5\' 9"  (1.753 m)   Wt (!) 406 lb (184.2 kg)   LMP 02/18/1995   BMI 59.96 kg/m    Subjective:    Patient ID: Allison Sharp, female    DOB: 1956/04/23, 62 y.o.   MRN: [id number]  HPI: Allison Sharp is a 62 y.o. female presenting on 07/02/2017 for Depression/anxiety (has appointment with psych on 07/10/2017); Fatigue (sleep study was cancelled because of snow, planning to schedule tomorrow, ); and Discuss walkers (Bariatric walker prescription - [employer])   HPI Anxiety Patient is coming in for anxiety to discuss options.  She is been having a lot of fatigue and wonders if there is something that she can take to help her with that in the evenings.  She sees psychiatry on 07/10/2017 and does not want to do any major changes but just wants to know if there is something they can help her with that.  She denies any suicidal ideations but just needs something especially to help with sleep.  She is currently on Cymbalta and Depakote.  Physical deconditioning and weakness and is at risk for fall Patient has been having increased weakness and physical deconditioning and has been having some weakness to the point where he feels like she is going to fall.  She says that she has been gradually worsening over the past few years.  Chronic low back pain Patient has chronic low back pain would like a refill for naproxen for it.  Her low back pain does not radiate anywhere and is moderate to severe at times.  Patient has a history of low platelets and wants a recheck  Relevant past medical, surgical, family and social history reviewed and updated as indicated. Interim medical history since our last visit reviewed. Allergies and medications reviewed and updated.  Review of Systems  Constitutional: Negative for chills, fatigue and fever.  Eyes: Negative for redness and visual disturbance.  Respiratory: Negative for chest tightness  and shortness of breath.   Cardiovascular: Negative for chest pain and leg swelling.  Musculoskeletal: Positive for back pain. Negative for gait problem.  Skin: Negative for rash.  Neurological: Positive for weakness. Negative for dizziness, light-headedness, numbness and headaches.  Psychiatric/Behavioral: Negative for agitation, behavioral problems, decreased concentration and dysphoric mood. The patient is nervous/anxious.   All other systems reviewed and are negative.   Per HPI unless specifically indicated above   Allergies as of 07/02/2017      Reactions   Ativan [lorazepam] Other (See Comments)   Delirium   Haldol [haloperidol] Other (See Comments)   Hallucinating    Naproxen Nausea And Vomiting      Medication List        Accurate as of 07/02/17  4:43 PM. Always use your most recent med list.          albuterol (2.5 MG/3ML) 0.083% nebulizer solution Commonly known as:  PROVENTIL Take 3 mLs (2.5 mg total) by nebulization every 4 (four) hours as needed for wheezing or shortness of breath.   PROAIR HFA 108 (90 Base) MCG/ACT inhaler Generic drug:  albuterol USE 2 PUFF EVERY 4 HOURS AS NEEDED FOR WHEEZING OR SHORTNESS OF BREATH   atorvastatin 20 MG tablet Commonly known as:  LIPITOR TAKE 1 TABLET DAILY   BREO ELLIPTA 100-25 MCG/INH Aepb Generic drug:  fluticasone furoate-vilanterol INHALE 1 PUFF INTO THE LUNGS DAILY   cetirizine 10  MG tablet Commonly known as:  ZYRTEC TAKE 1 TABLET ONCE DAILY   cloNIDine 0.2 MG tablet Commonly known as:  CATAPRES Take 0.2 mg by mouth at bedtime.   diclofenac sodium 1 % Gel Commonly known as:  VOLTAREN Apply to affected area twice daily as needed   divalproex 500 MG DR tablet Commonly known as:  DEPAKOTE Take 3 tablets (1,500 mg total) at bedtime by mouth.   DULoxetine 60 MG capsule Commonly known as:  CYMBALTA Take 1 capsule (60 mg total) daily by mouth.   fluticasone 50 MCG/ACT nasal spray Commonly known as:   FLONASE Place 1 spray 2 (two) times daily as needed into both nostrils for allergies or rhinitis.   hydrOXYzine 50 MG tablet Commonly known as:  ATARAX/VISTARIL Take 1 tablet (50 mg total) by mouth 3 (three) times daily as needed.   levothyroxine 112 MCG tablet Commonly known as:  SYNTHROID, LEVOTHROID TAKE (1) TABLET DAILY BE- FORE BREAKFAST FOR THYROID   lisinopril-hydrochlorothiazide 20-25 MG tablet Commonly known as:  PRINZIDE,ZESTORETIC Take 1 tablet by mouth daily.   lisinopril-hydrochlorothiazide 20-25 MG tablet Commonly known as:  PRINZIDE,ZESTORETIC Take 1 tablet by mouth daily.   lisinopril-hydrochlorothiazide 20-25 MG tablet Commonly known as:  PRINZIDE,ZESTORETIC TAKE 1 TABLET DAILY   multivitamin with minerals Tabs tablet Take 1 tablet by mouth daily. For nutritional supplementation.   naproxen 500 MG tablet Commonly known as:  NAPROSYN Take 1 tablet (500 mg total) by mouth 2 (two) times daily with a meal.   nystatin cream Commonly known as:  MYCOSTATIN APPLY TO THE AFFECTED AREA THREE TIMES DAILY AS NEEDED .Marland KitchenFOR TOPICAL USE ONLY...   omeprazole 20 MG capsule Commonly known as:  PRILOSEC TAKE (1) CAPSULE DAILY   ondansetron 4 MG disintegrating tablet Commonly known as:  ZOFRAN ODT Take 1 tablet (4 mg total) by mouth every 8 (eight) hours as needed for nausea or vomiting.   polyethylene glycol powder powder Commonly known as:  GLYCOLAX/MIRALAX Take 17 g by mouth 2 (two) times daily as needed.   risperiDONE 3 MG tablet Commonly known as:  RISPERDAL Take 1 tablet (3 mg total) at bedtime by mouth.            Durable Medical Equipment  (From admission, onward)        Start     Ordered   07/02/17 0000  DME Walker platform    Comments:  Patient needs a bariatric walker with wheels and seat  Question:  Patient needs a walker to treat with the following condition  Answer:  Weakness generalized   07/02/17 1641         Objective:    BP 119/81    Pulse (!) 105   Temp 98 F (36.7 C) (Oral)   Ht 5\' 9"  (1.753 m)   Wt (!) 406 lb (184.2 kg)   LMP 02/18/1995   BMI 59.96 kg/m   Wt Readings from Last 3 Encounters:  07/02/17 (!) 406 lb (184.2 kg)  05/04/17 (!) 394 lb (178.7 kg)  04/16/17 (!) 403 lb (182.8 kg)    Physical Exam  Constitutional: She is oriented to person, place, and time. She appears well-developed and well-nourished. No distress.  Eyes: Conjunctivae are normal.  Neck: Neck supple. No thyromegaly present.  Cardiovascular: Normal rate, regular rhythm, normal heart sounds and intact distal pulses.  No murmur heard. Pulmonary/Chest: Effort normal and breath sounds normal. No respiratory distress. She has no wheezes. She has no rales.  Musculoskeletal: Normal range of  motion. She exhibits no edema.       Lumbar back: She exhibits tenderness (Bilateral lumbar tenderness). She exhibits normal range of motion, no bony tenderness and no deformity.  Lymphadenopathy:    She has no cervical adenopathy.  Neurological: She is alert and oriented to person, place, and time. Coordination normal.  Skin: Skin is warm and dry. No rash noted. She is not diaphoretic.  Psychiatric: Her behavior is normal. Judgment normal. Her mood appears anxious. She exhibits a depressed mood. She expresses no suicidal ideation. She expresses no suicidal plans.  Nursing note and vitals reviewed.       Assessment & Plan:   Problem List Items Addressed This Visit      Other   Back pain, chronic   Relevant Medications   naproxen (NAPROSYN) 500 MG tablet    Other Visit Diagnoses    GAD (generalized anxiety disorder)    -  Primary   Relevant Medications   hydrOXYzine (ATARAX/VISTARIL) 50 MG tablet   Physical deconditioning       Relevant Orders   Ambulatory referral to Physical Therapy   DME Walker platform   Generalized weakness       Relevant Orders   Ambulatory referral to Physical Therapy   DME Walker platform   At low risk for fall        Relevant Orders   Ambulatory referral to Physical Therapy   DME Walker platform   Thrombocytopenia ([place])       Relevant Orders   CBC with Differential/Platelet (Completed)       Follow up plan: Return if symptoms worsen or fail to improve.  Counseling provided for all of the vaccine components Orders Placed This Encounter  Procedures  . DME Walker platform  . CBC with Differential/Platelet  . Ambulatory referral to Physical Therapy    [name], MD [facility] 07/02/2017, 4:43 PM

## 2017-07-03 LAB — CBC WITH DIFFERENTIAL/PLATELET
BASOS ABS: 0 10*3/uL (ref 0.0–0.2)
BASOS: 0 %
EOS (ABSOLUTE): 0.2 10*3/uL (ref 0.0–0.4)
Eos: 2 %
HEMOGLOBIN: 13.2 g/dL (ref 11.1–15.9)
Hematocrit: 38.4 % (ref 34.0–46.6)
IMMATURE GRANS (ABS): 0 10*3/uL (ref 0.0–0.1)
Immature Granulocytes: 0 %
LYMPHS ABS: 2.5 10*3/uL (ref 0.7–3.1)
LYMPHS: 22 %
MCH: 32 pg (ref 26.6–33.0)
MCHC: 34.4 g/dL (ref 31.5–35.7)
MCV: 93 fL (ref 79–97)
MONOCYTES: 8 %
Monocytes Absolute: 0.8 10*3/uL (ref 0.1–0.9)
Neutrophils Absolute: 7.4 10*3/uL — ABNORMAL HIGH (ref 1.4–7.0)
Neutrophils: 68 %
Platelets: 141 10*3/uL — ABNORMAL LOW (ref 150–379)
RBC: 4.12 x10E6/uL (ref 3.77–5.28)
RDW: 13.6 % (ref 12.3–15.4)
WBC: 11 10*3/uL — ABNORMAL HIGH (ref 3.4–10.8)

## 2017-07-05 ENCOUNTER — Telehealth: Payer: Self-pay | Admitting: Family Medicine

## 2017-07-05 DIAGNOSIS — A084 Viral intestinal infection, unspecified: Secondary | ICD-10-CM

## 2017-07-05 MED ORDER — PROMETHAZINE HCL 25 MG PO TABS: 25.0000 mg | ORAL_TABLET | Freq: Three times a day (TID) | ORAL | 0 refills | Status: DC | PRN

## 2017-07-05 MED ORDER — ONDANSETRON 4 MG PO TBDP: 4.0000 mg | ORAL_TABLET | Freq: Three times a day (TID) | ORAL | 0 refills | Status: DC | PRN

## 2017-07-05 NOTE — Telephone Encounter (Signed)
Have called in Zofran and Phenergan, I do not recommend using both of them at the same time but use either one or the other and see which works better.

## 2017-07-05 NOTE — Telephone Encounter (Signed)
Please advise 

## 2017-07-09 ENCOUNTER — Ambulatory Visit: Payer: Medicare Other | Attending: Family Medicine | Admitting: Neurology

## 2017-07-09 DIAGNOSIS — R0683 Snoring: Secondary | ICD-10-CM | POA: Diagnosis not present

## 2017-07-09 DIAGNOSIS — Z79899 Other long term (current) drug therapy: Secondary | ICD-10-CM | POA: Insufficient documentation

## 2017-07-09 DIAGNOSIS — G4733 Obstructive sleep apnea (adult) (pediatric): Secondary | ICD-10-CM | POA: Diagnosis not present

## 2017-07-09 DIAGNOSIS — Z79890 Hormone replacement therapy (postmenopausal): Secondary | ICD-10-CM | POA: Diagnosis not present

## 2017-07-09 DIAGNOSIS — I493 Ventricular premature depolarization: Secondary | ICD-10-CM | POA: Insufficient documentation

## 2017-07-09 DIAGNOSIS — Z9989 Dependence on other enabling machines and devices: Secondary | ICD-10-CM

## 2017-07-09 DIAGNOSIS — R5383 Other fatigue: Secondary | ICD-10-CM

## 2017-07-10 ENCOUNTER — Ambulatory Visit (HOSPITAL_COMMUNITY): Payer: Self-pay | Admitting: Psychiatry

## 2017-07-12 ENCOUNTER — Other Ambulatory Visit (HOSPITAL_COMMUNITY): Payer: Self-pay

## 2017-07-12 DIAGNOSIS — F3160 Bipolar disorder, current episode mixed, unspecified: Secondary | ICD-10-CM

## 2017-07-12 DIAGNOSIS — F3131 Bipolar disorder, current episode depressed, mild: Secondary | ICD-10-CM

## 2017-07-12 MED ORDER — DIVALPROEX SODIUM 500 MG PO DR TAB: 1500.0000 mg | DELAYED_RELEASE_TABLET | Freq: Every day | ORAL | 0 refills | Status: DC

## 2017-07-12 MED ORDER — RISPERIDONE 3 MG PO TABS: 3.0000 mg | ORAL_TABLET | Freq: Every day | ORAL | 0 refills | Status: DC

## 2017-07-13 DIAGNOSIS — R5381 Other malaise: Secondary | ICD-10-CM | POA: Diagnosis not present

## 2017-07-13 DIAGNOSIS — Z9181 History of falling: Secondary | ICD-10-CM | POA: Diagnosis not present

## 2017-07-13 DIAGNOSIS — R531 Weakness: Secondary | ICD-10-CM | POA: Diagnosis not present

## 2017-07-20 ENCOUNTER — Encounter: Payer: Self-pay | Admitting: Physician Assistant

## 2017-07-20 ENCOUNTER — Ambulatory Visit (INDEPENDENT_AMBULATORY_CARE_PROVIDER_SITE_OTHER): Payer: Medicare Other | Admitting: Physician Assistant

## 2017-07-20 VITALS — BP 99/60 | HR 60 | Temp 97.4°F | Ht 69.0 in | Wt >= 6400 oz

## 2017-07-20 DIAGNOSIS — L89211 Pressure ulcer of right hip, stage 1: Secondary | ICD-10-CM

## 2017-07-20 DIAGNOSIS — L989 Disorder of the skin and subcutaneous tissue, unspecified: Secondary | ICD-10-CM | POA: Diagnosis not present

## 2017-07-20 DIAGNOSIS — M79621 Pain in right upper arm: Secondary | ICD-10-CM

## 2017-07-20 DIAGNOSIS — R601 Generalized edema: Secondary | ICD-10-CM

## 2017-07-20 MED ORDER — CEPHALEXIN 500 MG PO CAPS: 500.0000 mg | ORAL_CAPSULE | Freq: Four times a day (QID) | ORAL | 0 refills | Status: DC

## 2017-07-20 MED ORDER — TRAMADOL HCL 50 MG PO TABS: 50.0000 mg | ORAL_TABLET | Freq: Three times a day (TID) | ORAL | 2 refills | Status: DC | PRN

## 2017-07-20 NOTE — Patient Instructions (Signed)
In a few days you may receive a survey in the mail or online from [employer] regarding your visit with us today. Please take a moment to fill this out. Your feedback is very important to our whole office. It can help us better understand your needs as well as improve your experience and satisfaction. Thank you for taking your time to complete it. We care about you.  Dannis Deroche, PA-C  

## 2017-07-20 NOTE — Progress Notes (Signed)
BP 99/60   Pulse 60   Temp (!) 97.4 F (36.3 C) (Oral)   Ht 5\' 9"  (1.753 m)   Wt (!) 406 lb (184.2 kg)   LMP 02/18/1995   BMI 59.96 kg/m    Subjective:    Patient ID: Allison Sharp, female    DOB: March 04, 1956, 62 y.o.   MRN: [id number]  HPI: Allison Sharp is a 62 y.o. female presenting on 07/20/2017 for Wound Infection (right hip); Diarrhea; Edema; and Wound Infection (Right little toe)  Over the past few days the patient has had increasing swelling.  She was seen for it last week.  She has been taking naproxen for about 3 weeks.  Of asked her to discontinue this medicine at this time because it is adding to her edema.  She also has a sore on her right iliac crest.  She has been elevating her legs a lot and there is a pressure point here.  She also pulled off the nail of her fifth toe.  She states she did not feel it.  Her caretaker helped her get off.  There is no external drainage or redness at this time.  Past Medical History:  Diagnosis Date  . Allergy   . Anxiety   . Arthritis   . Asthma   . Bipolar 1 disorder ([place])   . CAD (coronary artery disease)   . Cellulitis   . CHF (congestive heart failure) (H[facility])    diastolic dysfunction  . Chronic back pain   . Chronic headaches   . Complication of anesthesia    States she typically gets sick s/p anesthesia  . Contusion of sacrum   . COPD (chronic obstructive pulmonary disease) ([place])   . Depression   . Dyspnea    PFT 03/05/09 FEV1 2.77(98%), FVC 3.25(86%), FEV1% 85, TLC 5.88(99%), DLCO 60% ,  Methacholine challenge 03/16/09 normal ,  CT chest 03/12/09 no pulmonary disease  . Fungal infection   . GERD (gastroesophageal reflux disease)   . History of colonoscopy 10/17/2002   by Dr Rehman-> distal non-specific proctitis, small ext hemorrhoids,   . HTN (hypertension)   . Hyperlipidemia   . Hyperthyroidism   . Hypothyroidism    States she only has hyperthyroidism  . Migraine headache   . Morbid obesity with body mass index  of 50.0-59.9 in adult [facility]) JAN 2011 370 LBS   2004 311 BMI 45.9  . Myocardial infarction ([place])    NOV 1997  . OSA on CPAP    she had been on 2L O2 at night but that was stopped  . PONV (postoperative nausea and vomiting)   . PTSD (post-traumatic stress disorder)   . Suicidal ideation   . Urine incontinence   . Vitamin D deficiency    Relevant past medical, surgical, family and social history reviewed and updated as indicated. Interim medical history since our last visit reviewed. Allergies and medications reviewed and updated. DATA REVIEWED: CHART IN EPIC  Family History reviewed for pertinent findings.  Review of Systems  Constitutional: Positive for fatigue. Negative for activity change and fever.  HENT: Negative.   Eyes: Negative.   Respiratory: Negative.  Negative for cough, shortness of breath and wheezing.   Cardiovascular: Negative.  Negative for chest pain.  Gastrointestinal: Negative.  Negative for abdominal pain.  Endocrine: Negative.   Genitourinary: Negative.  Negative for dysuria.  Musculoskeletal: Positive for arthralgias, back pain, gait problem and neck pain.  Skin: Positive for color change and wound.  Allergies as of 07/20/2017      Reactions   Naproxen Nausea And Vomiting, Swelling   Ativan [lorazepam] Other (See Comments)   Delirium   Haldol [haloperidol] Other (See Comments)   Hallucinating       Medication List        Accurate as of 07/20/17 12:15 PM. Always use your most recent med list.          albuterol (2.5 MG/3ML) 0.083% nebulizer solution Commonly known as:  PROVENTIL Take 3 mLs (2.5 mg total) by nebulization every 4 (four) hours as needed for wheezing or shortness of breath.   PROAIR HFA 108 (90 Base) MCG/ACT inhaler Generic drug:  albuterol USE 2 PUFF EVERY 4 HOURS AS NEEDED FOR WHEEZING OR SHORTNESS OF BREATH   atorvastatin 20 MG tablet Commonly known as:  LIPITOR TAKE 1 TABLET DAILY   BREO ELLIPTA 100-25 MCG/INH  Aepb Generic drug:  fluticasone furoate-vilanterol INHALE 1 PUFF INTO THE LUNGS DAILY   cephALEXin 500 MG capsule Commonly known as:  KEFLEX Take 1 capsule (500 mg total) by mouth 4 (four) times daily.   cetirizine 10 MG tablet Commonly known as:  ZYRTEC TAKE 1 TABLET ONCE DAILY   cloNIDine 0.2 MG tablet Commonly known as:  CATAPRES Take 0.2 mg by mouth at bedtime.   diclofenac sodium 1 % Gel Commonly known as:  VOLTAREN Apply to affected area twice daily as needed   divalproex 500 MG DR tablet Commonly known as:  DEPAKOTE Take 3 tablets (1,500 mg total) by mouth at bedtime.   DULoxetine 60 MG capsule Commonly known as:  CYMBALTA Take 1 capsule (60 mg total) daily by mouth.   fluticasone 50 MCG/ACT nasal spray Commonly known as:  FLONASE Place 1 spray 2 (two) times daily as needed into both nostrils for allergies or rhinitis.   hydrOXYzine 50 MG tablet Commonly known as:  ATARAX/VISTARIL Take 1 tablet (50 mg total) by mouth 3 (three) times daily as needed.   levothyroxine 112 MCG tablet Commonly known as:  SYNTHROID, LEVOTHROID TAKE (1) TABLET DAILY BE- FORE BREAKFAST FOR THYROID   lisinopril-hydrochlorothiazide 20-25 MG tablet Commonly known as:  PRINZIDE,ZESTORETIC Take 1 tablet by mouth daily.   multivitamin with minerals Tabs tablet Take 1 tablet by mouth daily. For nutritional supplementation.   nystatin cream Commonly known as:  MYCOSTATIN APPLY TO THE AFFECTED AREA THREE TIMES DAILY AS NEEDED .Marland KitchenFOR TOPICAL USE ONLY...   omeprazole 20 MG capsule Commonly known as:  PRILOSEC TAKE (1) CAPSULE DAILY   ondansetron 4 MG disintegrating tablet Commonly known as:  ZOFRAN ODT Take 1 tablet (4 mg total) by mouth every 8 (eight) hours as needed for nausea or vomiting.   polyethylene glycol powder powder Commonly known as:  GLYCOLAX/MIRALAX Take 17 g by mouth 2 (two) times daily as needed.   promethazine 25 MG tablet Commonly known as:  PHENERGAN Take 1  tablet (25 mg total) by mouth every 8 (eight) hours as needed for nausea or vomiting.   risperiDONE 3 MG tablet Commonly known as:  RISPERDAL Take 1 tablet (3 mg total) by mouth at bedtime.   traMADol 50 MG tablet Commonly known as:  ULTRAM Take 1 tablet (50 mg total) by mouth every 8 (eight) hours as needed for moderate pain.          Objective:    BP 99/60   Pulse 60   Temp (!) 97.4 F (36.3 C) (Oral)   Ht 5\' 9"  (1.753 m)  Wt (!) 406 lb (184.2 kg)   LMP 02/18/1995   BMI 59.96 kg/m   Allergies  Allergen Reactions  . Naproxen Nausea And Vomiting and Swelling  . Ativan [Lorazepam] Other (See Comments)    Delirium  . Haldol [Haloperidol] Other (See Comments)    Hallucinating     Wt Readings from Last 3 Encounters:  07/20/17 (!) 406 lb (184.2 kg)  07/02/17 (!) 406 lb (184.2 kg)  05/04/17 (!) 394 lb (178.7 kg)    Physical Exam  Constitutional: She is oriented to person, place, and time. She appears well-developed and well-nourished.  HENT:  Head: Normocephalic and atraumatic.  Right Ear: Tympanic membrane, external ear and ear canal normal.  Left Ear: Tympanic membrane, external ear and ear canal normal.  Nose: Nose normal. No rhinorrhea.  Mouth/Throat: Oropharynx is clear and moist and mucous membranes are normal. No oropharyngeal exudate or posterior oropharyngeal erythema.  Eyes: Conjunctivae and EOM are normal. Pupils are equal, round, and reactive to light.  Neck: Normal range of motion. Neck supple.  Cardiovascular: Normal rate, regular rhythm, normal heart sounds and intact distal pulses.  Pulmonary/Chest: Effort normal and breath sounds normal.  Abdominal: Soft. Bowel sounds are normal.  Neurological: She is alert and oriented to person, place, and time. She has normal reflexes.  Skin: Skin is warm and dry. Lesion noted. No rash noted.     Ulcer, shallow on the right iliac crest. Less than 3 cm in width. Mild erythema.  Redness around the toenail base on  the right fifth toe  Psychiatric: She has a normal mood and affect. Her behavior is normal. Judgment and thought content normal.  Nursing note and vitals reviewed.   Results for orders placed or performed in visit on 07/02/17  CBC with Differential/Platelet  Result Value Ref Range   WBC 11.0 (H) 3.4 - 10.8 x10E3/uL   RBC 4.12 3.77 - 5.28 x10E6/uL   Hemoglobin 13.2 11.1 - 15.9 g/dL   Hematocrit 38.4 34.0 - 46.6 %   MCV 93 79 - 97 fL   MCH 32.0 26.6 - 33.0 pg   MCHC 34.4 31.5 - 35.7 g/dL   RDW 13.6 12.3 - 15.4 %   Platelets 141 (L) 150 - 379 x10E3/uL   Neutrophils 68 Not Estab. %   Lymphs 22 Not Estab. %   Monocytes 8 Not Estab. %   Eos 2 Not Estab. %   Basos 0 Not Estab. %   Neutrophils Absolute 7.4 (H) 1.4 - 7.0 x10E3/uL   Lymphocytes Absolute 2.5 0.7 - 3.1 x10E3/uL   Monocytes Absolute 0.8 0.1 - 0.9 x10E3/uL   EOS (ABSOLUTE) 0.2 0.0 - 0.4 x10E3/uL   Basophils Absolute 0.0 0.0 - 0.2 x10E3/uL   Immature Granulocytes 0 Not Estab. %   Immature Grans (Abs) 0.0 0.0 - 0.1 x10E3/uL      Assessment & Plan:   1. Tenderness of right axilla  2. Generalized edema  3. Sore on toe - cephALEXin (KEFLEX) 500 MG capsule; Take 1 capsule (500 mg total) by mouth 4 (four) times daily.  Dispense: 40 capsule; Refill: 0  4. Pressure injury of right hip, stage 1 - cephALEXin (KEFLEX) 500 MG capsule; Take 1 capsule (500 mg total) by mouth 4 (four) times daily.  Dispense: 40 capsule; Refill: 0   Continue all other maintenance medications as listed above.  Follow up plan: No Follow-up on file.  Educational handout given for [place] PA-C [place]  [place], [place] [id number] [phone]   07/20/2017, 12:15 PM

## 2017-07-24 NOTE — Procedures (Signed)
[place] A. Merlene Laughter, MD     [email or web address]             NOCTURNAL POLYSOMNOGRAPHY   LOCATION: [facility]  Patient Name: Allison, Sharp Date: 07/09/2017 Gender: Female D.O.B: 22-Nov-1955 Age (years): 61 Referring Provider: [name] Height (inches): 47 Interpreting Physician: Phillips Odor MD, ABSM Weight (lbs): 406 RPSGT: Rosebud Poles BMI: 60 MRN: [id number] Neck Size: 16.50 CLINICAL INFORMATION Sleep Study Type: NPSG     Indication for sleep study: N/A     Epworth Sleepiness Score: 9      Most recent polysomnogram dated 07/30/2015 revealed an AHI of 7.7/h and RDI of 7.7/h. Most recent titration study dated 07/30/2015 was optimal at 11cm H2O with an AHI of 0.0/h. SLEEP STUDY TECHNIQUE As per the AASM Manual for the Scoring of Sleep and Associated Events v2.3 (April 2016) with a hypopnea requiring 4% desaturations.  The channels recorded and monitored were frontal, central and occipital EEG, electrooculogram (EOG), submentalis EMG (chin), nasal and oral airflow, thoracic and abdominal wall motion, anterior tibialis EMG, snore microphone, electrocardiogram, and pulse oximetry.  MEDICATIONS Medications self-administered by patient taken the night of the study : DOXEPIN HCL, CLONIDINE, DIVALPROEX SODIUM, RESPERIDONE  Current Outpatient Medications:  .  albuterol (PROVENTIL) (2.5 MG/3ML) 0.083% nebulizer solution, Take 3 mLs (2.5 mg total) by nebulization every 4 (four) hours as needed for wheezing or shortness of breath., Disp: 75 mL, Rfl: 0 .  atorvastatin (LIPITOR) 20 MG tablet, TAKE 1 TABLET DAILY, Disp: 90 tablet, Rfl: 0 .  BREO ELLIPTA 100-25 MCG/INH AEPB, INHALE 1 PUFF INTO THE LUNGS DAILY, Disp: 60 each, Rfl: 5 .  cephALEXin (KEFLEX) 500 MG capsule, Take 1 capsule (500 mg total) by mouth 4 (four) times daily., Disp: 40 capsule, Rfl: 0 .  cetirizine (ZYRTEC) 10 MG tablet, TAKE 1 TABLET ONCE DAILY, Disp: 30 tablet, Rfl: 11 .   cloNIDine (CATAPRES) 0.2 MG tablet, Take 0.2 mg by mouth at bedtime., Disp: , Rfl:  .  diclofenac sodium (VOLTAREN) 1 % GEL, Apply to affected area twice daily as needed (Patient taking differently: Apply 2 g topically 2 (two) times daily as needed (PAIN). ), Disp: 100 g, Rfl: 5 .  divalproex (DEPAKOTE) 500 MG DR tablet, Take 3 tablets (1,500 mg total) by mouth at bedtime., Disp: 90 tablet, Rfl: 0 .  DULoxetine (CYMBALTA) 60 MG capsule, Take 1 capsule (60 mg total) daily by mouth., Disp: 90 capsule, Rfl: 2 .  fluticasone (FLONASE) 50 MCG/ACT nasal spray, Place 1 spray 2 (two) times daily as needed into both nostrils for allergies or rhinitis., Disp: 16 g, Rfl: 6 .  hydrOXYzine (ATARAX/VISTARIL) 50 MG tablet, Take 1 tablet (50 mg total) by mouth 3 (three) times daily as needed., Disp: 30 tablet, Rfl: 0 .  levothyroxine (SYNTHROID, LEVOTHROID) 112 MCG tablet, TAKE (1) TABLET DAILY BE- FORE BREAKFAST FOR THYROID, Disp: 90 tablet, Rfl: 0 .  lisinopril-hydrochlorothiazide (PRINZIDE,ZESTORETIC) 20-25 MG tablet, Take 1 tablet by mouth daily., Disp: 30 tablet, Rfl: 0 .  Multiple Vitamin (MULTIVITAMIN WITH MINERALS) TABS, Take 1 tablet by mouth daily. For nutritional supplementation., Disp: 30 tablet, Rfl: 0 .  nystatin cream (MYCOSTATIN), APPLY TO THE AFFECTED AREA THREE TIMES DAILY AS NEEDED .Marland KitchenFOR TOPICAL USE ONLY..., Disp: 30 g, Rfl: 0 .  omeprazole (PRILOSEC) 20 MG capsule, TAKE (1) CAPSULE DAILY, Disp: 30 capsule, Rfl: 2 .  ondansetron (ZOFRAN ODT) 4 MG disintegrating tablet, Take 1 tablet (4 mg total) by mouth every 8 (eight) hours  as needed for nausea or vomiting., Disp: 20 tablet, Rfl: 0 .  polyethylene glycol powder (GLYCOLAX/MIRALAX) powder, Take 17 g by mouth 2 (two) times daily as needed., Disp: 3350 g, Rfl: 1 .  PROAIR HFA 108 (90 Base) MCG/ACT inhaler, USE 2 PUFF EVERY 4 HOURS AS NEEDED FOR WHEEZING OR SHORTNESS OF BREATH, Disp: 8.5 g, Rfl: 5 .  promethazine (PHENERGAN) 25 MG tablet, Take 1 tablet  (25 mg total) by mouth every 8 (eight) hours as needed for nausea or vomiting., Disp: 20 tablet, Rfl: 0 .  risperiDONE (RISPERDAL) 3 MG tablet, Take 1 tablet (3 mg total) by mouth at bedtime., Disp: 30 tablet, Rfl: 0 .  traMADol (ULTRAM) 50 MG tablet, Take 1 tablet (50 mg total) by mouth every 8 (eight) hours as needed for moderate pain., Disp: 90 tablet, Rfl: 2     SLEEP ARCHITECTURE The study was initiated at 10:43:14 PM and ended at 4:45:21 AM.  Sleep onset time was 14.2 minutes and the sleep efficiency was 95.9%. The total sleep time was 347.4 minutes.  Stage REM latency was 207.0 minutes.  The patient spent 0.86% of the night in stage N1 sleep, 34.94% in stage N2 sleep, 51.53% in stage N3 and 12.67% in REM.  Alpha intrusion was absent.  Supine sleep was 53.78%.  RESPIRATORY PARAMETERS The overall apnea/hypopnea index (AHI) was 11.1 per hour. There were 4 total apneas, including 1 obstructive, 3 central and 0 mixed apneas. There were 60 hypopneas and 0 RERAs.  The AHI during Stage REM sleep was 60.0 per hour.  AHI while supine was 15.4 per hour.  The mean oxygen saturation was 90.04%. The minimum SpO2 during sleep was 67.00%.  moderate snoring was noted during this study.  CARDIAC DATA The 2 lead EKG demonstrated sinus rhythm. The mean heart rate was N/A beats per minute. Other EKG findings include: PVCs. LEG MOVEMENT DATA The total PLMS were 0 with a resulting PLMS index of 0.00. Associated arousal with leg movement index was 0.0.  IMPRESSIONS Mild to moderate obstructive sleep apnea worse during REM sleep is documented with this recording.  A formal CPAP titration study is recommended.   Delano Metz, MD Diplomate, American Board of Sleep Medicine.  ELECTRONICALLY SIGNED ON:  07/24/2017, 10:37 PM [place] PH: [phone]   FX: [phone] [place]

## 2017-07-27 ENCOUNTER — Encounter (HOSPITAL_COMMUNITY): Payer: Self-pay | Admitting: Psychiatry

## 2017-07-27 ENCOUNTER — Ambulatory Visit (INDEPENDENT_AMBULATORY_CARE_PROVIDER_SITE_OTHER): Payer: Medicare Other | Admitting: Psychiatry

## 2017-07-27 DIAGNOSIS — M549 Dorsalgia, unspecified: Secondary | ICD-10-CM | POA: Diagnosis not present

## 2017-07-27 DIAGNOSIS — Z818 Family history of other mental and behavioral disorders: Secondary | ICD-10-CM | POA: Diagnosis not present

## 2017-07-27 DIAGNOSIS — F3160 Bipolar disorder, current episode mixed, unspecified: Secondary | ICD-10-CM

## 2017-07-27 DIAGNOSIS — F3131 Bipolar disorder, current episode depressed, mild: Secondary | ICD-10-CM | POA: Diagnosis not present

## 2017-07-27 DIAGNOSIS — Z915 Personal history of self-harm: Secondary | ICD-10-CM

## 2017-07-27 DIAGNOSIS — M2550 Pain in unspecified joint: Secondary | ICD-10-CM

## 2017-07-27 DIAGNOSIS — Z811 Family history of alcohol abuse and dependence: Secondary | ICD-10-CM

## 2017-07-27 DIAGNOSIS — Z560 Unemployment, unspecified: Secondary | ICD-10-CM | POA: Diagnosis not present

## 2017-07-27 DIAGNOSIS — Z810 Family history of intellectual disabilities: Secondary | ICD-10-CM

## 2017-07-27 DIAGNOSIS — G4700 Insomnia, unspecified: Secondary | ICD-10-CM | POA: Diagnosis not present

## 2017-07-27 DIAGNOSIS — Z736 Limitation of activities due to disability: Secondary | ICD-10-CM

## 2017-07-27 MED ORDER — ARIPIPRAZOLE 10 MG PO TABS: ORAL_TABLET | ORAL | Status: DC

## 2017-07-27 MED ORDER — DIVALPROEX SODIUM 500 MG PO DR TAB: 1500.0000 mg | DELAYED_RELEASE_TABLET | Freq: Every day | ORAL | 0 refills | Status: DC

## 2017-07-27 MED ORDER — ARIPIPRAZOLE 10 MG PO TABS: ORAL_TABLET | ORAL | 0 refills | Status: DC

## 2017-07-27 MED ORDER — DULOXETINE HCL 60 MG PO CPEP: 60.0000 mg | ORAL_CAPSULE | Freq: Every day | ORAL | 2 refills | Status: DC

## 2017-07-27 NOTE — Progress Notes (Signed)
BH MD/PA/NP OP Progress Note  07/27/2017 8:26 AM Allison Sharp  MRN:  [id number]  Chief Complaint: I have been sick.  I started to have visual hallucination.  I am tired and difficulty walking.  HPI: Allison Sharp came for her follow-up appointment with her niece.  She has been sick lately and feel tired, lack of motivation, lack of energy and sometimes does not leave her room.  She was able to visit her granddaughter in [place].  Her niece reported that she has been seeing her primary care doctor frequently because of back pain.  She was prescribed Naprosyn but she developed side effects and massive edema.  She stopped Naprosyn and she lost 15 pounds.  She still feels weak and difficulty walking.  She recently seen neurology and have sleep study and she may need adjustment on her CPAP machine.  Patient feels lately more isolated, withdrawn, anhedonia and hopelessness.  She also endorsed having visual hallucination and seeing sometimes shadows and faces.  But she denies any agitation, crying spells or any suicidal thoughts.  She is taking Risperdal 3 mg.  She has tremors in her both hand.  She lives with her niece who is very helpful.  She denies any aggression or violence.  She denies drinking alcohol or using substances.  Visit Diagnosis:    ICD-10-CM   1. Bipolar affective disorder, currently depressed, mild (HCC) F31.31 DULoxetine (CYMBALTA) 60 MG capsule    divalproex (DEPAKOTE) 500 MG DR tablet    ARIPiprazole (ABILIFY) 10 MG tablet    DISCONTINUED: ARIPiprazole (ABILIFY) 10 MG tablet    DISCONTINUED: ARIPiprazole (ABILIFY) 10 MG tablet  2. Mixed bipolar I disorder (HCC) F31.60 divalproex (DEPAKOTE) 500 MG DR tablet    Past Psychiatric History: Reviewed. Patient has significant history of psychiatric illness with multiple psychiatric inpatient treatment. Her last admission was in January 2018 at [facility]. She had history of suicidal attempt by taking overdose. She had  diagnosed with PTSD, depression, bipolar disorder. In the past she had tried Paxil, Prozac, Wellbutrin, Effexor, Lexapro, amitriptyline, Cymbalta, Neurontin, trazodone, Thorazine, hydroxyzine, Luvox, Sinequan, Zyprexa and Latuda. She has best responded on Risperdal and Cymbalta.  Past Medical History:  Past Medical History:  Diagnosis Date  . Allergy   . Anxiety   . Arthritis   . Asthma   . Bipolar 1 disorder ([place])   . CAD (coronary artery disease)   . Cellulitis   . CHF (congestive heart failure) (HCC)    diastolic dysfunction  . Chronic back pain   . Chronic headaches   . Complication of anesthesia    States she typically gets sick s/p anesthesia  . Contusion of sacrum   . COPD (chronic obstructive pulmonary disease) ([place])   . Depression   . Dyspnea    PFT 03/05/09 FEV1 2.77(98%), FVC 3.25(86%), FEV1% 85, TLC 5.88(99%), DLCO 60% ,  Methacholine challenge 03/16/09 normal ,  CT chest 03/12/09 no pulmonary disease  . Fungal infection   . GERD (gastroesophageal reflux disease)   . History of colonoscopy 10/17/2002   by Dr Rehman-> distal non-specific proctitis, small ext hemorrhoids,   . HTN (hypertension)   . Hyperlipidemia   . Hyperthyroidism   . Hypothyroidism    States she only has hyperthyroidism  . Migraine headache   . Morbid obesity with body mass index of 50.0-59.9 in adult [facility]) JAN 2011 370 LBS   2004 311 BMI 45.9  . Myocardial infarction ([place])    NOV 1997  .  OSA on CPAP    she had been on 2L O2 at night but that was stopped  . PONV (postoperative nausea and vomiting)   . PTSD (post-traumatic stress disorder)   . Suicidal ideation   . Urine incontinence   . Vitamin D deficiency     Past Surgical History:  Procedure Laterality Date  . ABDOMINAL HYSTERECTOMY     sept 1996  . APPENDECTOMY    . BACK SURGERY  2008  . CARDIAC CATHETERIZATION     nov 1997  . CHOLECYSTECTOMY    . COLONOSCOPY  10/17/2002    Distal proctitis, small external hemorrhoids,  otherwise/  normal colonoscopy. Suspect rectal bleeding secondary to hemorrhoids  . ESOPHAGOGASTRODUODENOSCOPY  03/18/09   fundic gland polyps/mild gastritis  . HERNIA REPAIR  1978  . JOINT REPLACEMENT     bil knee replacement  . KNEE ARTHROSCOPY    . MULTIPLE EXTRACTIONS WITH ALVEOLOPLASTY N/A 08/16/2015   Procedure: EXTRACTION OF TEETH THREE, SIX, EIGHT, NINE, ELEVEN, FOURTEEN, FIFTEEN, TWENTY-EIGHT WITH ALVEOLOPLASTY;  Surgeon: Diona Browner, DDS;  Location: [place];  Service: Oral Surgery;  Laterality: N/A;  . TONSILLECTOMY    . TOTAL VAGINAL HYSTERECTOMY    . TUBAL LIGATION      Family Psychiatric History: Reviewed   Family History:  Family History  Problem Relation Age of Onset  . Hypertension Mother   . Bipolar disorder Mother   . Dementia Mother   . Depression Mother   . Heart attack Mother   . AAA (abdominal aortic aneurysm) Mother   . Coronary artery disease Father   . Alcohol abuse Father   . Hypertension Brother   . Coronary artery disease Brother   . Bipolar disorder Brother   . Depression Brother   . Depression Sister   . Paranoid behavior Sister   . Cancer Sister        breast  . Bipolar disorder Sister   . Depression Sister   . Hypertension Sister   . Cancer Son        thyroid  . Cancer Maternal Aunt        breast metastatized to brain  . Anesthesia problems Neg Hx   . Hypotension Neg Hx   . Malignant hyperthermia Neg Hx   . Pseudochol deficiency Neg Hx     Social History:  Social History   Socioeconomic History  . Marital status: Single    Spouse name: None  . Number of children: 2  . Years of education: None  . Highest education level: None  Social Needs  . Financial resource strain: None  . Food insecurity - worry: None  . Food insecurity - inability: None  . Transportation needs - medical: None  . Transportation needs - non-medical: None  Occupational History  . Occupation: Disabled    Fish farm manager: UNEMPLOYED    Comment: back problems   Tobacco Use  . Smoking status: Never Smoker  . Smokeless tobacco: Never Used  Substance and Sexual Activity  . Alcohol use: No    Alcohol/week: 0.0 oz  . Drug use: No  . Sexual activity: No    Birth control/protection: Abstinence  Other Topics Concern  . None  Social History Narrative   [name] is disabled and lives with her sister, Jeanice. She has another sister with whom she has lived with in the past, and who is a Designer, jewellery, but not currently practicing.   She has two grown sons that she does not see often, as  they live in other states. She has 5 grand children.   [name] has a long history of mental illness including depression, PTSD, suicidal and homicidal ideation.   She has been obese most all of her life. Her weight has significantly impacted her QOL. She recently lost 20 lbs. By decreasing portion size & increasing proteins.     Allergies:  Allergies  Allergen Reactions  . Naproxen Nausea And Vomiting and Swelling  . Ativan [Lorazepam] Other (See Comments)    Delirium  . Haldol [Haloperidol] Other (See Comments)    Hallucinating     Metabolic Disorder Labs: Recent Results (from the past 2160 hour(s))  CMP14+EGFR     Status: None   Collection Time: 05/04/17  4:26 PM  Result Value Ref Range   Glucose 98 65 - 99 mg/dL   BUN 15 8 - 27 mg/dL   Creatinine, Ser 0.88 0.57 - 1.00 mg/dL   GFR calc non Af Amer 70 >59 mL/min/1.73   GFR calc Af Amer 81 >59 mL/min/1.73   BUN/Creatinine Ratio 17 12 - 28   Sodium 140 134 - 144 mmol/L   Potassium 4.3 3.5 - 5.2 mmol/L   Chloride 101 96 - 106 mmol/L   CO2 24 20 - 29 mmol/L   Calcium 9.2 8.7 - 10.3 mg/dL   Total Protein 6.6 6.0 - 8.5 g/dL   Albumin 4.1 3.6 - 4.8 g/dL   Globulin, Total 2.5 1.5 - 4.5 g/dL   Albumin/Globulin Ratio 1.6 1.2 - 2.2   Bilirubin Total 0.4 0.0 - 1.2 mg/dL   Alkaline Phosphatase 71 39 - 117 IU/L   AST 10 0 - 40 IU/L   ALT 7 0 - 32 IU/L  TSH     Status: None   Collection Time: 05/04/17  4:26  PM  Result Value Ref Range   TSH 1.950 0.450 - 4.500 uIU/mL  CBC with Differential     Status: Abnormal   Collection Time: 05/04/17  4:26 PM  Result Value Ref Range   WBC 9.8 3.4 - 10.8 x10E3/uL   RBC 4.33 3.77 - 5.28 x10E6/uL   Hemoglobin 13.8 11.1 - 15.9 g/dL   Hematocrit 39.3 34.0 - 46.6 %   MCV 91 79 - 97 fL   MCH 31.9 26.6 - 33.0 pg   MCHC 35.1 31.5 - 35.7 g/dL   RDW 14.2 12.3 - 15.4 %   Platelets 119 (L) 150 - 379 x10E3/uL   Neutrophils 67 Not Estab. %   Lymphs 24 Not Estab. %   Monocytes 8 Not Estab. %   Eos 1 Not Estab. %   Basos 0 Not Estab. %   Neutrophils Absolute 6.6 1.4 - 7.0 x10E3/uL   Lymphocytes Absolute 2.3 0.7 - 3.1 x10E3/uL   Monocytes Absolute 0.8 0.1 - 0.9 x10E3/uL   EOS (ABSOLUTE) 0.1 0.0 - 0.4 x10E3/uL   Basophils Absolute 0.0 0.0 - 0.2 x10E3/uL   Immature Granulocytes 0 Not Estab. %   Immature Grans (Abs) 0.0 0.0 - 0.1 x10E3/uL  CBC with Differential/Platelet     Status: Abnormal   Collection Time: 07/02/17  4:47 PM  Result Value Ref Range   WBC 11.0 (H) 3.4 - 10.8 x10E3/uL   RBC 4.12 3.77 - 5.28 x10E6/uL   Hemoglobin 13.2 11.1 - 15.9 g/dL   Hematocrit 38.4 34.0 - 46.6 %   MCV 93 79 - 97 fL   MCH 32.0 26.6 - 33.0 pg   MCHC 34.4 31.5 - 35.7 g/dL   RDW  13.6 12.3 - 15.4 %   Platelets 141 (L) 150 - 379 x10E3/uL   Neutrophils 68 Not Estab. %   Lymphs 22 Not Estab. %   Monocytes 8 Not Estab. %   Eos 2 Not Estab. %   Basos 0 Not Estab. %   Neutrophils Absolute 7.4 (H) 1.4 - 7.0 x10E3/uL   Lymphocytes Absolute 2.5 0.7 - 3.1 x10E3/uL   Monocytes Absolute 0.8 0.1 - 0.9 x10E3/uL   EOS (ABSOLUTE) 0.2 0.0 - 0.4 x10E3/uL   Basophils Absolute 0.0 0.0 - 0.2 x10E3/uL   Immature Granulocytes 0 Not Estab. %   Immature Grans (Abs) 0.0 0.0 - 0.1 x10E3/uL   Lab Results  Component Value Date   HGBA1C 5.3 04/05/2017   MPG 105 04/05/2017   MPG 120 11/08/2015   No results found for: PROLACTIN Lab Results  Component Value Date   CHOL 236 (H) 07/02/2015    TRIG 181 (H) 07/02/2015   HDL 39 (L) 07/02/2015   CHOLHDL 6.1 (H) 07/02/2015   VLDL 30.0 12/10/2013   LDLCALC 161 (H) 07/02/2015   LDLCALC 89 12/10/2013   Lab Results  Component Value Date   TSH 1.950 05/04/2017   TSH 2.95 12/03/2015    Therapeutic Level Labs: No results found for: LITHIUM Lab Results  Component Value Date   VALPROATE 86.1 04/05/2017   VALPROATE 67 06/20/2016   No components found for:  CBMZ  Current Medications: Current Outpatient Medications  Medication Sig Dispense Refill  . albuterol (PROVENTIL) (2.5 MG/3ML) 0.083% nebulizer solution Take 3 mLs (2.5 mg total) by nebulization every 4 (four) hours as needed for wheezing or shortness of breath. 75 mL 0  . atorvastatin (LIPITOR) 20 MG tablet TAKE 1 TABLET DAILY 90 tablet 0  . BREO ELLIPTA 100-25 MCG/INH AEPB INHALE 1 PUFF INTO THE LUNGS DAILY 60 each 5  . cephALEXin (KEFLEX) 500 MG capsule Take 1 capsule (500 mg total) by mouth 4 (four) times daily. 40 capsule 0  . cetirizine (ZYRTEC) 10 MG tablet TAKE 1 TABLET ONCE DAILY 30 tablet 11  . cloNIDine (CATAPRES) 0.2 MG tablet Take 0.2 mg by mouth at bedtime.    . diclofenac sodium (VOLTAREN) 1 % GEL Apply to affected area twice daily as needed (Patient taking differently: Apply 2 g topically 2 (two) times daily as needed (PAIN). ) 100 g 5  . divalproex (DEPAKOTE) 500 MG DR tablet Take 3 tablets (1,500 mg total) by mouth at bedtime. 90 tablet 0  . DULoxetine (CYMBALTA) 60 MG capsule Take 1 capsule (60 mg total) daily by mouth. 90 capsule 2  . fluticasone (FLONASE) 50 MCG/ACT nasal spray Place 1 spray 2 (two) times daily as needed into both nostrils for allergies or rhinitis. 16 g 6  . hydrOXYzine (ATARAX/VISTARIL) 50 MG tablet Take 1 tablet (50 mg total) by mouth 3 (three) times daily as needed. 30 tablet 0  . levothyroxine (SYNTHROID, LEVOTHROID) 112 MCG tablet TAKE (1) TABLET DAILY BE- FORE BREAKFAST FOR THYROID 90 tablet 0  . lisinopril-hydrochlorothiazide  (PRINZIDE,ZESTORETIC) 20-25 MG tablet Take 1 tablet by mouth daily. 30 tablet 0  . Multiple Vitamin (MULTIVITAMIN WITH MINERALS) TABS Take 1 tablet by mouth daily. For nutritional supplementation. 30 tablet 0  . nystatin cream (MYCOSTATIN) APPLY TO THE AFFECTED AREA THREE TIMES DAILY AS NEEDED .Marland KitchenFOR TOPICAL USE ONLY... 30 g 0  . omeprazole (PRILOSEC) 20 MG capsule TAKE (1) CAPSULE DAILY 30 capsule 2  . ondansetron (ZOFRAN ODT) 4 MG disintegrating tablet Take 1 tablet (  4 mg total) by mouth every 8 (eight) hours as needed for nausea or vomiting. 20 tablet 0  . polyethylene glycol powder (GLYCOLAX/MIRALAX) powder Take 17 g by mouth 2 (two) times daily as needed. 3350 g 1  . PROAIR HFA 108 (90 Base) MCG/ACT inhaler USE 2 PUFF EVERY 4 HOURS AS NEEDED FOR WHEEZING OR SHORTNESS OF BREATH 8.5 g 5  . promethazine (PHENERGAN) 25 MG tablet Take 1 tablet (25 mg total) by mouth every 8 (eight) hours as needed for nausea or vomiting. 20 tablet 0  . risperiDONE (RISPERDAL) 3 MG tablet Take 1 tablet (3 mg total) by mouth at bedtime. 30 tablet 0  . traMADol (ULTRAM) 50 MG tablet Take 1 tablet (50 mg total) by mouth every 8 (eight) hours as needed for moderate pain. 90 tablet 2   No current facility-administered medications for this visit.      Musculoskeletal: Strength & Muscle Tone: decreased Gait & Station: unsteady, Difficulty walking because of pain Patient leans: See above.  Psychiatric Specialty Exam: Review of Systems  Constitutional: Positive for malaise/fatigue.  HENT: Negative.   Musculoskeletal: Positive for back pain and joint pain.  Skin: Negative.   Neurological: Positive for tingling and tremors.  Psychiatric/Behavioral: Positive for depression. The patient has insomnia.     Blood pressure 138/84, pulse (!) 125, height _0  (1.753 m), weight (!) 393 lb (178.3 kg), last menstrual period 02/18/1995.Body mass index is 58.04 kg/m.  General Appearance: Casual  Eye Contact:  Fair  Speech:   Slow  Volume:  Decreased  Mood:  Anxious, Depressed and Dysphoric  Affect:  Depressed  Thought Process:  Goal Directed  Orientation:  Full (Time, Place, and Person)  Thought Content: Hallucinations: Seeing shadows and faces and Rumination   Suicidal Thoughts:  No  Homicidal Thoughts:  No  Memory:  Immediate;   Fair Recent;   Fair Remote;   Fair  Judgement:  Good  Insight:  Good  Psychomotor Activity:  Decreased and Tremor  Concentration:  Concentration: Fair and Attention Span: Fair  Recall:  [employer] of Knowledge: Good  Language: Good  Akathisia:  No  Handed:  Right  AIMS (if indicated): not done  Assets:  Communication Skills Desire for Improvement Housing Social Support  ADL's:  Intact  Cognition: WNL  Sleep:  Fair   Screenings: AIMS     Admission (Discharged) from 01/17/2015 in [facility]  AIMS Total Score  0    AUDIT     Admission (Discharged) from 01/17/2015 in [facility] Admission (Discharged) from 10/24/2012 in [facility]  Alcohol Use Disorder Identification Test Final Score (AUDIT)  0  0    Mini-Mental     Clinical Support from 11/08/2016 in [facility] from 06/28/2015 in [facility]  Total Score (max 30 points )  30  29    PHQ2-9     Office Visit from 07/20/2017 in [facility] Visit from 07/02/2017 in [facility] Visit from 05/04/2017 in [facility] Office Visit from 04/16/2017 in [facility] from 11/08/2016 in [facility]  PHQ-2 Total Score  0  0  0  1  1       Assessment and Plan: Bipolar disorder type I.  Anxiety disorder NOS.  Patient has been sick and have multiple health issues.  She  also experiencing visual hallucination.  She like the Risperdal  but she is having tremors.  She never tried Abilify.  We will try Abilify 5 mg daily for 1 week and then 10 mg daily.  She will cut down her Risperdal to 1.5 mg for 1 week and then discontinued.  I will continue 60 mg Cymbalta daily and Depakote 1500 mg at bedtime.  She had blood work done recently at her primary care physician's office.  I review CBC.  Her platelets are low.  She is scheduled to see primary care physician and further work for her sleep study and may need adjustment of CPAP.  She is prescribed Vistaril from her primary care physician which she takes as needed.  I recommended to call us back if she has any question or any concern.  If tremor do not improve by stopping the Risperdal we will consider adding Cogentin.  Discussed safety concerns at any time having active suicidal thoughts or homicidal thought that she need to call 911 or go to local emergency room.  Follow-up in 3 months.  Time spent 25 minutes.  More than 50% of the time spent in psychoeducation, counseling and coronation of care.   Kathlee Nations, MD 07/27/2017, 8:26 AM

## 2017-07-29 NOTE — Progress Notes (Deleted)
Cardiology Office Note:    Date:  07/29/2017   ID:  Allison Sharp, DOB [birth year], MRN [id number]  PCP:  Dettinger, Fransisca Kaufmann, MD  Cardiologist:  No primary care provider on file.    Referring MD: Dettinger, Fransisca Kaufmann, MD   No chief complaint on file.   History of Present Illness:    Allison Sharp is a 62 y.o. female with a hx of mild OSA with an AHI of 7.7/hr and oxygen desaturations as low as 83% and is on 11cm H2O.  She is doing well with her CPAP device.  She tolerates the mask and feels the pressure is adequate.  Since going on CPAP she feels rested in the am and has no significant daytime sleepiness.  She denies any significant mouth or nasal dryness or nasal congestion.  She does not think that he snores.     Past Medical History:  Diagnosis Date  . Allergy   . Anxiety   . Arthritis   . Asthma   . Bipolar 1 disorder ([place])   . CAD (coronary artery disease)   . Cellulitis   . CHF (congestive heart failure) (HCC)    diastolic dysfunction  . Chronic back pain   . Chronic headaches   . Complication of anesthesia    States she typically gets sick s/p anesthesia  . Contusion of sacrum   . COPD (chronic obstructive pulmonary disease) ([place])   . Depression   . Dyspnea    PFT 03/05/09 FEV1 2.77(98%), FVC 3.25(86%), FEV1% 85, TLC 5.88(99%), DLCO 60% ,  Methacholine challenge 03/16/09 normal ,  CT chest 03/12/09 no pulmonary disease  . Fungal infection   . GERD (gastroesophageal reflux disease)   . History of colonoscopy 10/17/2002   by Dr Rehman-> distal non-specific proctitis, small ext hemorrhoids,   . HTN (hypertension)   . Hyperlipidemia   . Hyperthyroidism   . Hypothyroidism    States she only has hyperthyroidism  . Migraine headache   . Morbid obesity with body mass index of 50.0-59.9 in adult [facility]) JAN 2011 370 LBS   2004 311 BMI 45.9  . Myocardial infarction ([place])    NOV 1997  . OSA on CPAP    she had been on 2L O2 at night but that was stopped  . PONV  (postoperative nausea and vomiting)   . PTSD (post-traumatic stress disorder)   . Suicidal ideation   . Urine incontinence   . Vitamin D deficiency     Past Surgical History:  Procedure Laterality Date  . ABDOMINAL HYSTERECTOMY     sept 1996  . APPENDECTOMY    . BACK SURGERY  2008  . CARDIAC CATHETERIZATION     nov 1997  . CHOLECYSTECTOMY    . COLONOSCOPY  10/17/2002    Distal proctitis, small external hemorrhoids, otherwise/  normal colonoscopy. Suspect rectal bleeding secondary to hemorrhoids  . ESOPHAGOGASTRODUODENOSCOPY  03/18/09   fundic gland polyps/mild gastritis  . HERNIA REPAIR  1978  . JOINT REPLACEMENT     bil knee replacement  . KNEE ARTHROSCOPY    . MULTIPLE EXTRACTIONS WITH ALVEOLOPLASTY N/A 08/16/2015   Procedure: EXTRACTION OF TEETH THREE, SIX, EIGHT, NINE, ELEVEN, FOURTEEN, FIFTEEN, TWENTY-EIGHT WITH ALVEOLOPLASTY;  Surgeon: Diona Browner, DDS;  Location: [facility];  Service: Oral Surgery;  Laterality: N/A;  . TONSILLECTOMY    . TOTAL VAGINAL HYSTERECTOMY    . TUBAL LIGATION      Current Medications: No outpatient medications have been marked as taking  for the 07/30/17 encounter (Appointment) with Sueanne Margarita, MD.     Allergies:   Naproxen; Ativan [lorazepam]; and Haldol [haloperidol]   Social History   Socioeconomic History  . Marital status: Single    Spouse name: Not on file  . Number of children: 2  . Years of education: Not on file  . Highest education level: Not on file  Social Needs  . Financial resource strain: Not on file  . Food insecurity - worry: Not on file  . Food insecurity - inability: Not on file  . Transportation needs - medical: Not on file  . Transportation needs - non-medical: Not on file  Occupational History  . 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 Smoking status: Never Smoker  . Smokeless tobacco: Never Used  Substance and Sexual Activity  . Alcohol use: No     Alcohol/week: 0.0 oz  . Drug use: No  . Sexual activity: No    Birth control/protection: Abstinence  Other Topics Concern  . Not on file  Social History Narrative   Allison Sharp is disabled and lives with her sister, Bettejane. She has another sister with whom she has lived with in the past, and who is a Designer, jewellery, but not currently practicing.   She has two grown sons that she does not see often, as they live in other states. She has 5 grand children.   Allison Sharp has a long history of mental illness including depression, PTSD, suicidal and homicidal ideation.   She has been obese most all of her life. Her weight has significantly impacted her QOL. She recently lost 20 lbs. By decreasing portion size & increasing proteins.      Family History: The patient's family history includes AAA (abdominal aortic aneurysm) in her mother; Alcohol abuse in her father; Bipolar disorder in her brother, mother, and sister; Cancer in her maternal aunt, sister, and son; Coronary artery disease in her brother and father; Dementia in her mother; Depression in her brother, mother, sister, and sister; Heart attack in her mother; Hypertension in her brother, mother, and sister; Paranoid behavior in her sister. There is no history of Anesthesia problems, Hypotension, Malignant hyperthermia, or Pseudochol deficiency.  ROS:   Please see the history of present illness.    ROS  All other systems reviewed and negative.   EKGs/Labs/Other Studies Reviewed:    The following studies were reviewed today: PAP download  EKG:  EKG is not ordered today.  T Recent Labs: 05/04/2017: ALT 7; BUN 15; Creatinine, Ser 0.88; Potassium 4.3; Sodium 140; TSH 1.950 07/02/2017: Hemoglobin 13.2; Platelets 141   Recent Lipid Panel    Component Value Date/Time   CHOL 236 (H) 07/02/2015 1003   TRIG 181 (H) 07/02/2015 1003   HDL 39 (L) 07/02/2015 1003   CHOLHDL 6.1 (H) 07/02/2015 1003   CHOLHDL 3 12/10/2013 1201   VLDL 30.0 12/10/2013  1201   LDLCALC 161 (H) 07/02/2015 1003    Physical Exam:    VS:  LMP 02/18/1995     Wt Readings from Last 3 Encounters:  07/20/17 (!) 406 lb (184.2 kg)  07/02/17 (!) 406 lb (184.2 kg)  05/04/17 (!) 394 lb (178.7 kg)     GEN:  Well nourished, well developed in no acute distress HEENT: Normal NECK: No JVD; No carotid bruits LYMPHATICS: No lymphadenopathy CARDIAC: RRR, no murmurs, rubs, gallops RESPIRATORY:  Clear to auscultation without rales, wheezing or  rhonchi  ABDOMEN: Soft, non-tender, non-distended MUSCULOSKELETAL:  No edema; No deformity  SKIN: Warm and dry NEUROLOGIC:  Alert and oriented x 3 PSYCHIATRIC:  Normal affect   ASSESSMENT:    1. OSA on CPAP   2. Essential hypertension, benign   3. Severe obesity (BMI >= 40) (HCC)    PLAN:    In order of problems listed above:  1. OSA - the patient is tolerating PAP therapy well without any problems. The PAP download was reviewed today and showed an AHI of ***/hr on *** cm H2O with ***% compliance in using more than 4 hours nightly.  The patient has been using and benefiting from PAP use and will continue to benefit from therapy.   2.  HTN -her blood pressure is well controlled on exam today.  She will continue on lisinopril-HCTZ 20-25 mg daily, clonidine 0.2 mg daily  3.  Obesity - I have encouraged her to get into a routine exercise program and cut back on carbs and portions.     Medication Adjustments/Labs and Tests Ordered: Current medicines are reviewed at length with the patient today.  Concerns regarding medicines are outlined above.  No orders of the defined types were placed in this encounter.  No orders of the defined types were placed in this encounter.   Signed, Fransico Him, MD  07/29/2017 9:28 PM    [place]

## 2017-07-30 ENCOUNTER — Ambulatory Visit: Payer: Self-pay | Admitting: Cardiology

## 2017-07-30 ENCOUNTER — Other Ambulatory Visit (HOSPITAL_COMMUNITY): Payer: Self-pay | Admitting: Psychiatry

## 2017-07-30 ENCOUNTER — Telehealth (HOSPITAL_COMMUNITY): Payer: Self-pay

## 2017-07-30 MED ORDER — BENZTROPINE MESYLATE 1 MG PO TABS: 1.0000 mg | ORAL_TABLET | Freq: Every day | ORAL | 0 refills | Status: DC

## 2017-07-30 MED ORDER — RISPERIDONE 4 MG PO TABS: 2.0000 mg | ORAL_TABLET | Freq: Every day | ORAL | 0 refills | Status: DC

## 2017-07-30 NOTE — Telephone Encounter (Signed)
Patients niece is calling, she states that since starting the Abilify, patient has been hallucinating. She said patient is very out of it and would like a call from you. Please review and advise, thank you

## 2017-07-30 NOTE — Telephone Encounter (Signed)
Returned patient's phone call.  Her niece told that she is experiencing increased hallucination with Abilify.  Last night she did not give Abilify and gave Risperdal which she had taken in the past and voices were not strong.  I recommended discontinue Abilify and start taking Risperdal 4 mg at bedtime.  I would also add low-dose Cogentin to help the tremors.  We switch from Risperdal to Abilify as patient was speeding seeing tremors.  If patient continue to have hallucination with Risperdal 4 mg then we will switch to Saphris.  Also encouraged to keep appointment with Dr. for sleep studies and to adjust her CPAP mask.Marland Kitchen  Her niece acknowledged and agreed to call us back week from now if symptoms do not improve.  I will call her pharmacy a new prescription for Risperdal 4 mg and Cogentin 1 mg at bedtime.

## 2017-08-01 ENCOUNTER — Other Ambulatory Visit: Payer: Self-pay

## 2017-08-01 ENCOUNTER — Emergency Department (HOSPITAL_COMMUNITY): Payer: Medicare Other

## 2017-08-01 ENCOUNTER — Encounter (HOSPITAL_COMMUNITY): Payer: Self-pay | Admitting: *Deleted

## 2017-08-01 ENCOUNTER — Emergency Department (HOSPITAL_COMMUNITY)
Admission: EM | Admit: 2017-08-01 | Discharge: 2017-08-02 | Disposition: A | Discharge status: Home / Self Care | Payer: Medicare Other | Attending: Emergency Medicine | Admitting: Emergency Medicine

## 2017-08-01 DIAGNOSIS — R441 Visual hallucinations: Secondary | ICD-10-CM | POA: Diagnosis not present

## 2017-08-01 DIAGNOSIS — I5032 Chronic diastolic (congestive) heart failure: Secondary | ICD-10-CM | POA: Insufficient documentation

## 2017-08-01 DIAGNOSIS — F319 Bipolar disorder, unspecified: Secondary | ICD-10-CM | POA: Insufficient documentation

## 2017-08-01 DIAGNOSIS — I2510 Atherosclerotic heart disease of native coronary artery without angina pectoris: Secondary | ICD-10-CM | POA: Diagnosis not present

## 2017-08-01 DIAGNOSIS — Z79899 Other long term (current) drug therapy: Secondary | ICD-10-CM | POA: Diagnosis not present

## 2017-08-01 DIAGNOSIS — R440 Auditory hallucinations: Secondary | ICD-10-CM | POA: Diagnosis not present

## 2017-08-01 DIAGNOSIS — G4733 Obstructive sleep apnea (adult) (pediatric): Secondary | ICD-10-CM

## 2017-08-01 DIAGNOSIS — J449 Chronic obstructive pulmonary disease, unspecified: Secondary | ICD-10-CM | POA: Diagnosis not present

## 2017-08-01 DIAGNOSIS — R05 Cough: Secondary | ICD-10-CM | POA: Diagnosis not present

## 2017-08-01 DIAGNOSIS — R443 Hallucinations, unspecified: Secondary | ICD-10-CM

## 2017-08-01 LAB — CBC WITH DIFFERENTIAL/PLATELET
Basophils Absolute: 0 10*3/uL (ref 0.0–0.1)
Basophils Relative: 0 %
Eosinophils Absolute: 0.1 10*3/uL (ref 0.0–0.7)
Eosinophils Relative: 1 %
HCT: 39.7 % (ref 36.0–46.0)
Hemoglobin: 13.8 g/dL (ref 12.0–15.0)
Lymphocytes Relative: 25 %
Lymphs Abs: 2.7 10*3/uL (ref 0.7–4.0)
MCH: 32.5 pg (ref 26.0–34.0)
MCHC: 34.8 g/dL (ref 30.0–36.0)
MCV: 93.4 fL (ref 78.0–100.0)
Monocytes Absolute: 1 10*3/uL (ref 0.1–1.0)
Monocytes Relative: 9 %
Neutro Abs: 6.9 10*3/uL (ref 1.7–7.7)
Neutrophils Relative %: 65 %
Platelets: 113 10*3/uL — ABNORMAL LOW (ref 150–400)
RBC: 4.25 MIL/uL (ref 3.87–5.11)
RDW: 13.1 % (ref 11.5–15.5)
WBC: 10.7 10*3/uL — ABNORMAL HIGH (ref 4.0–10.5)

## 2017-08-01 LAB — URINALYSIS, ROUTINE W REFLEX MICROSCOPIC
Bacteria, UA: NONE SEEN
Bilirubin Urine: NEGATIVE
Glucose, UA: NEGATIVE mg/dL
HGB URINE DIPSTICK: NEGATIVE
Ketones, ur: 5 mg/dL — AB
LEUKOCYTES UA: NEGATIVE
NITRITE: NEGATIVE
PH: 5 (ref 5.0–8.0)
Protein, ur: 30 mg/dL — AB
SPECIFIC GRAVITY, URINE: 1.029 (ref 1.005–1.030)
Squamous Epithelial / LPF: NONE SEEN

## 2017-08-01 LAB — I-STAT CHEM 8, ED
BUN: 18 mg/dL (ref 6–20)
CALCIUM ION: 1.17 mmol/L (ref 1.15–1.40)
Chloride: 94 mmol/L — ABNORMAL LOW (ref 101–111)
Creatinine, Ser: 1 mg/dL (ref 0.44–1.00)
Glucose, Bld: 120 mg/dL — ABNORMAL HIGH (ref 65–99)
HEMATOCRIT: 40 % (ref 36.0–46.0)
Hemoglobin: 13.6 g/dL (ref 12.0–15.0)
Potassium: 3.4 mmol/L — ABNORMAL LOW (ref 3.5–5.1)
SODIUM: 135 mmol/L (ref 135–145)
TCO2: 32 mmol/L (ref 22–32)

## 2017-08-01 NOTE — ED Triage Notes (Signed)
Pt has been hallucinations, hearing voices and seeing things, pt has been seen by Dr Rhea Belton who changed her meds, symptoms got worse, meds changed again, pt uses cpap.Sats in upper 80s in triage.

## 2017-08-01 NOTE — Discharge Instructions (Signed)
All the results in the ER are normal, labs and imaging. We are not sure what is causing your symptoms. There certainly appears to be underlying psychiatric cause, but also there is a component of poorly controlled sleep apnea. The workup in the ER is not complete, and is limited to screening for life threatening and emergent conditions only, so please see the primary care doctor tomorrow. Return to the ER if the symptoms get worse.

## 2017-08-01 NOTE — ED Notes (Signed)
Pt family states patient experiencing hallucinations for the past 3 weeks after their psychiatrist changed their medication. Family reports the patient gradually lost the ability to differentiate reality from hallucination.

## 2017-08-01 NOTE — ED Notes (Signed)
Family at bedside. Patient niece states she is her caretaker.

## 2017-08-02 ENCOUNTER — Ambulatory Visit (INDEPENDENT_AMBULATORY_CARE_PROVIDER_SITE_OTHER): Payer: Medicare Other | Admitting: Family Medicine

## 2017-08-02 ENCOUNTER — Encounter: Payer: Self-pay | Admitting: Family Medicine

## 2017-08-02 ENCOUNTER — Ambulatory Visit: Payer: Self-pay | Admitting: Cardiology

## 2017-08-02 ENCOUNTER — Telehealth: Payer: Self-pay | Admitting: *Deleted

## 2017-08-02 VITALS — BP 117/72 | HR 86 | Temp 98.0°F | Ht 69.0 in | Wt 396.0 lb

## 2017-08-02 DIAGNOSIS — R443 Hallucinations, unspecified: Secondary | ICD-10-CM

## 2017-08-02 DIAGNOSIS — G4733 Obstructive sleep apnea (adult) (pediatric): Secondary | ICD-10-CM

## 2017-08-02 DIAGNOSIS — R2681 Unsteadiness on feet: Secondary | ICD-10-CM

## 2017-08-02 DIAGNOSIS — R0902 Hypoxemia: Secondary | ICD-10-CM | POA: Diagnosis not present

## 2017-08-02 DIAGNOSIS — Z9989 Dependence on other enabling machines and devices: Principal | ICD-10-CM

## 2017-08-02 DIAGNOSIS — W19XXXA Unspecified fall, initial encounter: Secondary | ICD-10-CM

## 2017-08-02 NOTE — ED Provider Notes (Signed)
[facility] DEPT Provider Note   CSN: [id number] Arrival date & time: 08/01/17  1754     History   Chief Complaint Chief Complaint  Patient presents with  . Hallucinations    HPI CADEN FATICA is a 61 y.o. female.  HPI Pt comes in with cc of hallucinations. Patient has history of bipolar disorder, CAD, CHF, chronic pain and depression.  Patient also has obstructive sleep apnea and she utilizes BiPAP at nighttime.  Patient's niece provides majority of the history.  She reports that over the past few days patient's hallucinations have gotten worse.  Patient is hearing voices, and seeing things.  Patient in the past has had auditory and visual hallucinations when her depression gets worse.  Patient has seen her psychiatrist and there has been some medication changes made, however her symptoms have continued to progress.  The psychiatrist at this time thinks that the underlying process driving the hallucinations is medical, and is advised patient to see her PCP and get the BiPAP optimize at nighttime.  The psychiatrist to follow up patient in 2 weeks, and if she is not getting better than he is going to adjust medications further.    Patient is currently not using the BiPAP.  She just had a sleep study done 2 weeks ago but was not given any recommendations on titrating the BiPAP.  Patient does not feel comfortable using the BiPAP at nighttime with the existing setting.  Past Medical History:  Diagnosis Date  . Allergy   . Anxiety   . Arthritis   . Asthma   . Bipolar 1 disorder ([place])   . CAD (coronary artery disease)   . Cellulitis   . CHF (congestive heart failure) (HCC)    diastolic dysfunction  . Chronic back pain   . Chronic headaches   . Complication of anesthesia    States she typically gets sick s/p anesthesia  . Contusion of sacrum   . COPD (chronic obstructive pulmonary disease) ([place])   . Depression   . Dyspnea    PFT 03/05/09 FEV1  2.77(98%), FVC 3.25(86%), FEV1% 85, TLC 5.88(99%), DLCO 60% ,  Methacholine challenge 03/16/09 normal ,  CT chest 03/12/09 no pulmonary disease  . Fungal infection   . GERD (gastroesophageal reflux disease)   . History of colonoscopy 10/17/2002   by Dr Rehman-> distal non-specific proctitis, small ext hemorrhoids,   . HTN (hypertension)   . Hyperlipidemia   . Hyperthyroidism   . Hypothyroidism    States she only has hyperthyroidism  . Migraine headache   . Morbid obesity with body mass index of 50.0-59.9 in adult [facility]) JAN 2011 370 LBS   2004 311 BMI 45.9  . Myocardial infarction ([place])    NOV 1997  . OSA on CPAP    she had been on 2L O2 at night but that was stopped  . PONV (postoperative nausea and vomiting)   . PTSD (post-traumatic stress disorder)   . Suicidal ideation   . Urine incontinence   . Vitamin D deficiency     Patient Active Problem List   Diagnosis Date Noted  . Diarrhea 09/19/2016  . Bipolar I disorder, most recent episode depressed, severe w psychosis ([place]) 06/21/2016  . History of MI (myocardial infarction) 06/21/2016  . Spinal stenosis of lumbar region with neurogenic claudication 03/29/2016  . Severe obesity (BMI >= 40) ([place]) 07/05/2015  . Hyperlipidemia LDL goal <130 07/05/2015  . MDD (major depressive disorder), single episode, severe with  psychotic features ([place]) 01/21/2015  . 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 Cystitis 04/01/2014  . Bilateral knee pain 01/07/2014  . Scar condition and fibrosis of skin 12/11/2013  . Essential hypertension, benign 07/01/2013  . Asthma with acute exacerbation 11/04/2012  . OSA on CPAP 11/04/2012  . Back pain, chronic 08/27/2012  . Vitamin D insufficiency 04/04/2012  . Insomnia due to mental disorder 04/04/2012  . Anxiety 04/04/2012  . OCD (obsessive compulsive disorder) 04/04/2012  . PTSD (post-traumatic stress disorder) 06/28/2011  . Coronary artery disease 08/26/2010  . FATTY LIVER DISEASE 03/29/2009  . GERD  03/01/2009  . Morbid obesity ([place]) 02/15/2009  . HYPOTHYROIDISM 02/12/2009    Past Surgical History:  Procedure Laterality Date  . ABDOMINAL HYSTERECTOMY     sept 1996  . APPENDECTOMY    . BACK SURGERY  2008  . CARDIAC CATHETERIZATION     nov 1997  . CHOLECYSTECTOMY    . COLONOSCOPY  10/17/2002    Distal proctitis, small external hemorrhoids, otherwise/  normal colonoscopy. Suspect rectal bleeding secondary to hemorrhoids  . ESOPHAGOGASTRODUODENOSCOPY  03/18/09   fundic gland polyps/mild gastritis  . HERNIA REPAIR  1978  . JOINT REPLACEMENT     bil knee replacement  . KNEE ARTHROSCOPY    . MULTIPLE EXTRACTIONS WITH ALVEOLOPLASTY N/A 08/16/2015   Procedure: EXTRACTION OF TEETH THREE, SIX, EIGHT, NINE, ELEVEN, FOURTEEN, FIFTEEN, TWENTY-EIGHT WITH ALVEOLOPLASTY;  Surgeon: Diona Browner, DDS;  Location: [facility];  Service: Oral Surgery;  Laterality: N/A;  . TONSILLECTOMY    . TOTAL VAGINAL HYSTERECTOMY    . TUBAL LIGATION      OB History    No data available       Home Medications    Prior to Admission medications   Medication Sig Start Date End Date Taking? Authorizing Provider  atorvastatin (LIPITOR) 20 MG tablet TAKE 1 TABLET DAILY 06/29/17  Yes Dettinger, Fransisca Kaufmann, MD  benztropine (COGENTIN) 1 MG tablet Take 1 tablet (1 mg total) by mouth at bedtime. 07/30/17 07/30/18 Yes Arfeen, Arlyce Harman, MD  cephALEXin (KEFLEX) 500 MG capsule Take 1 capsule (500 mg total) by mouth 4 (four) times daily. 07/20/17  Yes Terald Sleeper, PA-C  cetirizine (ZYRTEC) 10 MG tablet TAKE 1 TABLET ONCE DAILY 08/22/16  Yes Dettinger, Fransisca Kaufmann, MD  cloNIDine (CATAPRES) 0.2 MG tablet Take 0.2 mg by mouth at bedtime.   Yes [provider]  divalproex (DEPAKOTE) 500 MG DR tablet Take 3 tablets (1,500 mg total) by mouth at bedtime. 07/27/17  Yes Arfeen, Arlyce Harman, MD  DULoxetine (CYMBALTA) 60 MG capsule Take 1 capsule (60 mg total) by mouth daily. 07/27/17  Yes Arfeen, Arlyce Harman, MD  levothyroxine (SYNTHROID, LEVOTHROID)  112 MCG tablet TAKE (1) TABLET DAILY BE- FORE BREAKFAST FOR THYROID 04/04/17  Yes Dettinger, Fransisca Kaufmann, MD  lisinopril-hydrochlorothiazide (PRINZIDE,ZESTORETIC) 20-25 MG tablet Take 1 tablet by mouth daily. 03/23/17  Yes Dettinger, Fransisca Kaufmann, MD  omeprazole (PRILOSEC) 20 MG capsule TAKE (1) CAPSULE DAILY 06/01/17  Yes Dettinger, Fransisca Kaufmann, MD  risperidone (RISPERDAL) 4 MG tablet Take 0.5 tablets (2 mg total) by mouth at bedtime. 07/30/17 07/30/18 Yes Arfeen, Arlyce Harman, MD  traMADol (ULTRAM) 50 MG tablet Take 1 tablet (50 mg total) by mouth every 8 (eight) hours as needed for moderate pain. 07/20/17  Yes Terald Sleeper, PA-C  albuterol (PROVENTIL) (2.5 MG/3ML) 0.083% nebulizer solution Take 3 mLs (2.5 mg total) by nebulization every 4 (four) hours as needed for wheezing or shortness of breath. 06/02/16  Dettinger, Fransisca Kaufmann, MD  BREO ELLIPTA 100-25 MCG/INH AEPB INHALE 1 PUFF INTO THE LUNGS DAILY Patient taking differently: Inhale 1 puff into the lungs daily as needed (sob and wheezing).  07/24/16   Dettinger, Fransisca Kaufmann, MD  diclofenac sodium (VOLTAREN) 1 % GEL Apply to affected area twice daily as needed Patient taking differently: Apply 2 g topically 2 (two) times daily as needed (PAIN).  03/15/16   Dettinger, Fransisca Kaufmann, MD  fluticasone (FLONASE) 50 MCG/ACT nasal spray Place 1 spray 2 (two) times daily as needed into both nostrils for allergies or rhinitis. 04/16/17   Terald Sleeper, PA-C  hydrOXYzine (ATARAX/VISTARIL) 50 MG tablet Take 1 tablet (50 mg total) by mouth 3 (three) times daily as needed. 07/02/17   Dettinger, Fransisca Kaufmann, MD  Multiple Vitamin (MULTIVITAMIN WITH MINERALS) TABS Take 1 tablet by mouth daily. For nutritional supplementation. Patient not taking: Reported on 08/01/2017 10/31/12   Elmarie Shiley A, NP  nystatin cream (MYCOSTATIN) APPLY TO THE AFFECTED AREA THREE TIMES DAILY AS NEEDED .Marland KitchenFOR TOPICAL USE ONLY... 02/20/17   Dettinger, Fransisca Kaufmann, MD  ondansetron (ZOFRAN ODT) 4 MG disintegrating tablet Take 1 tablet  (4 mg total) by mouth every 8 (eight) hours as needed for nausea or vomiting. 07/05/17   Dettinger, Fransisca Kaufmann, MD  polyethylene glycol powder (GLYCOLAX/MIRALAX) powder Take 17 g by mouth 2 (two) times daily as needed. Patient taking differently: Take 17 g by mouth 2 (two) times daily as needed for moderate constipation.  06/07/17   Dettinger, Fransisca Kaufmann, MD  PROAIR HFA 108 [phone] Base) MCG/ACT inhaler USE 2 PUFF EVERY 4 HOURS AS NEEDED FOR WHEEZING OR SHORTNESS OF BREATH 07/24/16   Dettinger, Fransisca Kaufmann, MD  promethazine (PHENERGAN) 25 MG tablet Take 1 tablet (25 mg total) by mouth every 8 (eight) hours as needed for nausea or vomiting. 07/05/17   Dettinger, Fransisca Kaufmann, MD    Family History Family History  Problem Relation Age of Onset  . Hypertension Mother   . Bipolar disorder Mother   . Dementia Mother   . Depression Mother   . Heart attack Mother   . AAA (abdominal aortic aneurysm) Mother   . Coronary artery disease Father   . Alcohol abuse Father   . Hypertension Brother   . Coronary artery disease Brother   . Bipolar disorder Brother   . Depression Brother   . Depression Sister   . Paranoid behavior Sister   . Cancer Sister        breast  . Bipolar disorder Sister   . Depression Sister   . Hypertension Sister   . Cancer Son        thyroid  . Cancer Maternal Aunt        breast metastatized to brain  . Anesthesia problems Neg Hx   . Hypotension Neg Hx   . Malignant hyperthermia Neg Hx   . Pseudochol deficiency Neg Hx     Social History Social History   Tobacco Use  . Smoking status: Never Smoker  . Smokeless tobacco: Never Used  Substance Use Topics  . Alcohol use: No    Alcohol/week: 0.0 oz  . Drug use: No     Allergies   Naproxen; Ativan [lorazepam]; and Haldol [haloperidol]   Review of Systems Review of Systems  All other systems reviewed and are negative.    Physical Exam Updated Vital Signs BP 126/88   Pulse 91   Temp (!) 97.5 F (36.4 C) (Oral)   Resp Marland Kitchen)  21   LMP 02/18/1995   SpO2 97%   Physical Exam  Constitutional: She is oriented to person, place, and time. She appears well-developed.  Morbidly obese  HENT:  Head: Normocephalic and atraumatic.  Eyes: EOM are normal.  Neck: Normal range of motion. Neck supple.  Cardiovascular: Normal rate.  Pulmonary/Chest: Effort normal.  Abdominal: Bowel sounds are normal.  Musculoskeletal: She exhibits edema and tenderness. She exhibits no deformity.  Neurological: She is alert and oriented to person, place, and time.  Skin: Skin is warm and dry.  Psychiatric:  Oriented to self, location, and able to answer simple questions.  Patient is following simple commands.  Patient able to tell me who the president is.  Positive hallucination, but not responding to internal stimuli.  Nursing note and vitals reviewed.    ED Treatments / Results  Labs (all labs ordered are listed, but only abnormal results are displayed) Labs Reviewed  CBC WITH DIFFERENTIAL/PLATELET - Abnormal; Notable for the following components:      Result Value   WBC 10.7 (*)    Platelets 113 (*)    All other components within normal limits  URINALYSIS, ROUTINE W REFLEX MICROSCOPIC - Abnormal; Notable for the following components:   Color, Urine AMBER (*)    Ketones, ur 5 (*)    Protein, ur 30 (*)    All other components within normal limits  I-STAT CHEM 8, ED - Abnormal; Notable for the following components:   Potassium 3.4 (*)    Chloride 94 (*)    Glucose, Bld 120 (*)    All other components within normal limits    EKG  EKG Interpretation None       Radiology Dg Chest 2 View  Result Date: 08/01/2017 CLINICAL DATA:  Cough EXAM: CHEST - 2 VIEW COMPARISON:  05/23/2015 FINDINGS: No focal pulmonary infiltrate or effusion. Stable cardiomediastinal silhouette. No pneumothorax. Mild degenerative changes of the spine. IMPRESSION: No active cardiopulmonary disease. Electronically Signed   By: Donavan Foil M.D.   On:  08/01/2017 19:48    Procedures Procedures (including critical care time)  Medications Ordered in ED Medications - No data to display   Initial Impression / Assessment and Plan / ED Course  I have reviewed the triage vital signs and the nursing notes.  Pertinent labs & imaging results that were available during my care of the patient were reviewed by me and considered in my medical decision making (see chart for details).     62 year old female comes in with chief complaint of worsening hallucinations.  Patient has history of bipolar disorder, depression and she also had obstructive sleep apnea which is not being optimally managed at this time.  Patient has seen her psychiatrist for her worsening hallucinations, and despite medication changes she has not improved.  The psychiatrist thinks that the underlying problem is more medical and patient not sleeping well, therefore he has requested optimization on the BiPAP and follow-up with him for potential changes.  In the interim patient symptoms are getting worse and she does not have a BiPAP titration.  I saw the sleep study that was done 2 weeks ago.  Patient is having intermittent episodes of hypoxia while asleep.  I spoke with the physician on call for patient's primary care doctor, and they will get patient in at 11:00 in the morning tomorrow.  I spoke with the medicine team and the behavioral health team, at this time patient does not meet any admission criteria for medicine site,  and unless patient has a size/HI, psychiatry does not think she needs any admission either.  Since patient already has outpatient follow-up with PCP and is well established with her psychiatry, we think it would be best for her to see her medicine doctor tomorrow, get the BiPAP situation squared up, and then see the psychiatrist in 2 weeks.  I informed the patient and her family that although we do not have a clear answer as to the etiology of her symptoms, we are  concerned about her well-being.  I will try to reach out to patient's family tomorrow afternoon to ensure they are doing well and they are grateful about that.  Final Clinical Impressions(s) / ED Diagnoses   Final diagnoses:  Hallucinations  Obstructive sleep apnea  Chronic obstructive pulmonary disease, unspecified COPD type [facility])    ED Discharge Orders    None       Varney Biles, MD 08/02/17 0120

## 2017-08-02 NOTE — ED Provider Notes (Signed)
I called [phone] as I had promised y'day. The niece didn't pick up the phone call. I left a voice mail inquiring about the results of the PCP visit this morning and asked her to call me at [facility] this evening if she had any questions.   Varney Biles, MD 08/02/17 [phone]

## 2017-08-02 NOTE — Telephone Encounter (Signed)
Per Dr Radford Pax, CPAP titration sent to sleep pool for pre cert

## 2017-08-02 NOTE — Progress Notes (Signed)
BP 117/72   Pulse 86   Temp 98 F (36.7 C) (Oral)   Ht 5\' 9"  (1.753 m)   Wt (!) 396 lb (179.6 kg)   LMP 02/18/1995   BMI 58.48 kg/m    Subjective:    Patient ID: Allison Sharp, female    DOB: 1955/08/06, 62 y.o.   MRN: [id number]  HPI: Allison Sharp is a 62 y.o. female presenting on 08/02/2017 for ER follow up (daughter reports she is not wearing bi-pap at night, oxygen is running low, she is having disorientation, waiting on titration study approval, she will not wear until then.  In patient psych will not admit her unless she is wearing Bi-pap; wants a prescription for a hospital bed, having problems getting into bed and falling out of bed)   HPI Sleep apnea and oxygen desaturation Patient is coming in for an ER follow-up for oxygen desaturation and sleep apnea and disorientation.  Her daughter who is here with her is giving most of the history and says that she will desaturate down into the 60s or 50s at times and on the last sleep evaluation she was having overnight issues were she would have 60 incidences of apnea and desaturation per hour during deep sleep.  She has not been using her CPAP because she does not feel like it fits well this is been going on for the past couple months which is also been associated with her having more of this disorientation and hallucinations.  She has talked with her psychiatry as well and they are concerned that if she does not get the sleep apnea fixed before they make any medication changes that they might not be able to get things under control.  She is also been having gait instability and difficulty walking and difficulty getting in and out of her bed and would like a hospital bed because she cannot get in and out easily and needs one that can lower down so she can get out.  She has had these falls recurrently but does not have any major pains or trauma from yet but they are concerned that she will eventually if she continues to go along these lines.   Her hallucinations are hearing things and seeing things that are not there and often happen at night.  Relevant past medical, surgical, family and social history reviewed and updated as indicated. Interim medical history since our last visit reviewed. Allergies and medications reviewed and updated.  Review of Systems  Constitutional: Negative for chills and fever.  Eyes: Negative for visual disturbance.  Respiratory: Positive for shortness of breath. Negative for chest tightness.   Cardiovascular: Negative for chest pain and leg swelling.  Musculoskeletal: Negative for back pain and gait problem.  Skin: Negative for rash.  Neurological: Positive for weakness. Negative for light-headedness and headaches.  Psychiatric/Behavioral: Positive for agitation, behavioral problems, confusion, decreased concentration, dysphoric mood, hallucinations and sleep disturbance. Negative for self-injury and suicidal ideas. The patient is nervous/anxious.   All other systems reviewed and are negative.   Per HPI unless specifically indicated above   Allergies as of 08/02/2017      Reactions   Abilify [aripiprazole] Other (See Comments)   hallucinations   Naproxen Nausea And Vomiting, Swelling   Ativan [lorazepam] Other (See Comments)   Delirium   Haldol [haloperidol] Other (See Comments)   Hallucinating       Medication List        Accurate as of 08/02/17  5:16 PM.  Always use your most recent med list.          albuterol (2.5 MG/3ML) 0.083% nebulizer solution Commonly known as:  PROVENTIL Take 3 mLs (2.5 mg total) by nebulization every 4 (four) hours as needed for wheezing or shortness of breath.   PROAIR HFA 108 (90 Base) MCG/ACT inhaler Generic drug:  albuterol USE 2 PUFF EVERY 4 HOURS AS NEEDED FOR WHEEZING OR SHORTNESS OF BREATH   atorvastatin 20 MG tablet Commonly known as:  LIPITOR TAKE 1 TABLET DAILY   benztropine 1 MG tablet Commonly known as:  COGENTIN Take 1 tablet (1 mg total)  by mouth at bedtime.   BREO ELLIPTA 100-25 MCG/INH Aepb Generic drug:  fluticasone furoate-vilanterol INHALE 1 PUFF INTO THE LUNGS DAILY   cephALEXin 500 MG capsule Commonly known as:  KEFLEX Take 1 capsule (500 mg total) by mouth 4 (four) times daily.   cetirizine 10 MG tablet Commonly known as:  ZYRTEC TAKE 1 TABLET ONCE DAILY   cloNIDine 0.2 MG tablet Commonly known as:  CATAPRES Take 0.2 mg by mouth at bedtime.   diclofenac sodium 1 % Gel Commonly known as:  VOLTAREN Apply to affected area twice daily as needed   divalproex 500 MG DR tablet Commonly known as:  DEPAKOTE Take 3 tablets (1,500 mg total) by mouth at bedtime.   DULoxetine 60 MG capsule Commonly known as:  CYMBALTA Take 1 capsule (60 mg total) by mouth daily.   fluticasone 50 MCG/ACT nasal spray Commonly known as:  FLONASE Place 1 spray 2 (two) times daily as needed into both nostrils for allergies or rhinitis.   hydrOXYzine 50 MG tablet Commonly known as:  ATARAX/VISTARIL Take 1 tablet (50 mg total) by mouth 3 (three) times daily as needed.   levothyroxine 112 MCG tablet Commonly known as:  SYNTHROID, LEVOTHROID TAKE (1) TABLET DAILY BE- FORE BREAKFAST FOR THYROID   lisinopril-hydrochlorothiazide 20-25 MG tablet Commonly known as:  PRINZIDE,ZESTORETIC Take 1 tablet by mouth daily.   multivitamin with minerals Tabs tablet Take 1 tablet by mouth daily. For nutritional supplementation.   nystatin cream Commonly known as:  MYCOSTATIN APPLY TO THE AFFECTED AREA THREE TIMES DAILY AS NEEDED .Marland KitchenFOR TOPICAL USE ONLY...   omeprazole 20 MG capsule Commonly known as:  PRILOSEC TAKE (1) CAPSULE DAILY   ondansetron 4 MG disintegrating tablet Commonly known as:  ZOFRAN ODT Take 1 tablet (4 mg total) by mouth every 8 (eight) hours as needed for nausea or vomiting.   polyethylene glycol powder powder Commonly known as:  GLYCOLAX/MIRALAX Take 17 g by mouth 2 (two) times daily as needed.   promethazine 25  MG tablet Commonly known as:  PHENERGAN Take 1 tablet (25 mg total) by mouth every 8 (eight) hours as needed for nausea or vomiting.   risperidone 4 MG tablet Commonly known as:  RISPERDAL Take 0.5 tablets (2 mg total) by mouth at bedtime.   traMADol 50 MG tablet Commonly known as:  ULTRAM Take 1 tablet (50 mg total) by mouth every 8 (eight) hours as needed for moderate pain.          Objective:    BP 117/72   Pulse 86   Temp 98 F (36.7 C) (Oral)   Ht 5\' 9"  (1.753 m)   Wt (!) 396 lb (179.6 kg)   LMP 02/18/1995   BMI 58.48 kg/m   Wt Readings from Last 3 Encounters:  08/02/17 (!) 396 lb (179.6 kg)  07/20/17 (!) 406 lb (184.2 kg)  07/02/17 (!) 406 lb (184.2 kg)    Physical Exam  Constitutional: She is oriented to person, place, and time. She appears well-developed and well-nourished. No distress.  Eyes: Conjunctivae are normal.  Neck: Neck supple. No thyromegaly present.  Cardiovascular: Normal rate, regular rhythm, normal heart sounds and intact distal pulses.  No murmur heard. Pulmonary/Chest: Effort normal and breath sounds normal. No respiratory distress. She has no wheezes. She has no rales.  Musculoskeletal: Normal range of motion.  Lymphadenopathy:    She has no cervical adenopathy.  Neurological: She is alert and oriented to person, place, and time. Coordination normal.  Skin: Skin is warm and dry. No rash noted. She is not diaphoretic.  Psychiatric: Her mood appears anxious. Her affect is blunt. She is slowed. Cognition and memory are impaired. She exhibits a depressed mood. She expresses no suicidal ideation. She expresses no suicidal plans. She is inattentive.  Nursing note and vitals reviewed.       Assessment & Plan:   Problem List Items Addressed This Visit    None    Visit Diagnoses    Obstructive sleep apnea syndrome    -  Primary   Patient has been desaturating, will start on 2 L nasal cannula   Hallucination       Likely from severe  depression because she is not sleeping because of uncontrolled sleep apnea   Fall, initial encounter       Oxygen desaturation       Gait instability        96% at rest on room air. desaturated to 78% on ambulation here in the office, will start on 2 L nasal cannula oxygen and given supplies for such. 90% with ambulation on 2 L nasal cannula  Follow up plan: Return if symptoms worsen or fail to improve.  Counseling provided for all of the vaccine components No orders of the defined types were placed in this encounter.   [name], MD [facility] 08/02/2017, 5:16 PM

## 2017-08-02 NOTE — Care Management Note (Signed)
Case Management Note  CM consulted for "home help."  CM noted no HH orders and that pt has multiple doctors appointments today.  Pt's primary caregiver is her niece.  Niece can speak with PCP and other provider being seen today about her concerns and needs.  They will be able to order [facility] if needed at that time.  CM is not able to give out [facility] lists per Medicare guidelines.  No further CM needs noted.

## 2017-08-02 NOTE — Telephone Encounter (Signed)
Call came in today from patients niece to cancel patients appointment stating patient had been in the hospital over night because she fell off the bed at home and the niece could not get her up off the floor so she called 911.  Patients niece states the patient needs 02 during the day and on CPAP because her 02 registers 67% when she wakes in the mornings. PAP assistant informed her (per DPR) that the patients appointment had been cancelled because she was supposed to have a CPAP titration scheduled instead of an office appointment.  [name] (niece) says they would like to get 02 for the patient at [facility].  Patients niece says the full face mask does not work well for the patient and she ask for a nasal pillow mask for the patient when she gets an new CPAP machine. Also the patients CPAP machine is worn out and not working properly. Pap assistant will send her titration to the pre cert dept today. Please advise,  Gae Bon

## 2017-08-02 NOTE — Progress Notes (Deleted)
Cardiology Office Note:    Date:  08/02/2017   ID:  Allison Sharp, DOB 07-07-1955, MRN [id number]  PCP:  Dettinger, Fransisca Kaufmann, MD  Cardiologist:  No primary care provider on file.    Referring MD: Dettinger, Fransisca Kaufmann, MD   No chief complaint on file.   History of Present Illness:    Allison Sharp is a 62 y.o. female with a hx of ***  Past Medical History:  Diagnosis Date  . Allergy   . Anxiety   . Arthritis   . Asthma   . Bipolar 1 disorder ([place])   . CAD (coronary artery disease)   . Cellulitis   . CHF (congestive heart failure) (H[facility])    diastolic dysfunction  . Chronic back pain   . Chronic headaches   . Complication of anesthesia    States she typically gets sick s/p anesthesia  . Contusion of sacrum   . COPD (chronic obstructive pulmonary disease) ([place])   . Depression   . Dyspnea    PFT 03/05/09 FEV1 2.77(98%), FVC 3.25(86%), FEV1% 85, TLC 5.88(99%), DLCO 60% ,  Methacholine challenge 03/16/09 normal ,  CT chest 03/12/09 no pulmonary disease  . Fungal infection   . GERD (gastroesophageal reflux disease)   . History of colonoscopy 10/17/2002   by Dr Rehman-> distal non-specific proctitis, small ext hemorrhoids,   . HTN (hypertension)   . Hyperlipidemia   . Hyperthyroidism   . Hypothyroidism    States she only has hyperthyroidism  . Migraine headache   . Morbid obesity with body mass index of 50.0-59.9 in adult [facility]) JAN 2011 370 LBS   2004 311 BMI 45.9  . Myocardial infarction ([place])    NOV 1997  . OSA on CPAP    she had been on 2L O2 at night but that was stopped  . PONV (postoperative nausea and vomiting)   . PTSD (post-traumatic stress disorder)   . Suicidal ideation   . Urine incontinence   . Vitamin D deficiency     Past Surgical History:  Procedure Laterality Date  . ABDOMINAL HYSTERECTOMY     sept 1996  . APPENDECTOMY    . BACK SURGERY  2008  . CARDIAC CATHETERIZATION     nov 1997  . CHOLECYSTECTOMY    . COLONOSCOPY  10/17/2002    Distal  proctitis, small external hemorrhoids, otherwise/  normal colonoscopy. Suspect rectal bleeding secondary to hemorrhoids  . ESOPHAGOGASTRODUODENOSCOPY  03/18/09   fundic gland polyps/mild gastritis  . HERNIA REPAIR  1978  . JOINT REPLACEMENT     bil knee replacement  . KNEE ARTHROSCOPY    . MULTIPLE EXTRACTIONS WITH ALVEOLOPLASTY N/A 08/16/2015   Procedure: EXTRACTION OF TEETH THREE, SIX, EIGHT, NINE, ELEVEN, FOURTEEN, FIFTEEN, TWENTY-EIGHT WITH ALVEOLOPLASTY;  Surgeon: Diona Browner, DDS;  Location: [facility];  Service: Oral Surgery;  Laterality: N/A;  . TONSILLECTOMY    . TOTAL VAGINAL HYSTERECTOMY    . TUBAL LIGATION      Current Medications: No outpatient medications have been marked as taking for the 08/02/17 encounter (Appointment) with Sueanne Margarita, MD.     Allergies:   Naproxen; Ativan [lorazepam]; and Haldol [haloperidol]   Social History   Socioeconomic History  . Marital status: Single    Spouse name: Not on file  . Number of children: 2  . Years of education: Not on file  . Highest education level: Not on file  Social Needs  . Financial resource strain: Not on file  .  Food insecurity - worry: Not on file  . Food insecurity - inability: Not on file  . Transportation needs - medical: Not on file  . Transportation needs - non-medical: Not on file  Occupational History  . 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 Smoking status: Never Smoker  . Smokeless tobacco: Never Used  Substance and Sexual Activity  . Alcohol use: No    Alcohol/week: 0.0 oz  . Drug use: No  . Sexual activity: No    Birth control/protection: Abstinence  Other Topics Concern  . Not on file  Social History Narrative   Ms. Allison Sharp is disabled and lives with her sister, Destiny. She has another sister with whom she has lived with in the past, and who is a Designer, jewellery, but not currently practicing.   She has two grown sons that she does not see often,  as they live in other states. She has 5 grand children.   Ms. Allison Sharp has a long history of mental illness including depression, PTSD, suicidal and homicidal ideation.   She has been obese most all of her life. Her weight has significantly impacted her QOL. She recently lost 20 lbs. By decreasing portion size & increasing proteins.      Family History: The patient's ***family history includes AAA (abdominal aortic aneurysm) in her mother; Alcohol abuse in her father; Bipolar disorder in her brother, mother, and sister; Cancer in her maternal aunt, sister, and son; Coronary artery disease in her brother and father; Dementia in her mother; Depression in her brother, mother, sister, and sister; Heart attack in her mother; Hypertension in her brother, mother, and sister; Paranoid behavior in her sister. There is no history of Anesthesia problems, Hypotension, Malignant hyperthermia, or Pseudochol deficiency.  ROS:   Please see the history of present illness.    ROS  All other systems reviewed and negative.   EKGs/Labs/Other Studies Reviewed:    The following studies were reviewed today: ***  EKG:  EKG is *** ordered today.  The ekg ordered today demonstrates ***  Recent Labs: 05/04/2017: ALT 7; TSH 1.950 08/01/2017: BUN 18; Creatinine, Ser 1.00; Hemoglobin 13.6; Platelets 113; Potassium 3.4; Sodium 135   Recent Lipid Panel    Component Value Date/Time   CHOL 236 (H) 07/02/2015 1003   TRIG 181 (H) 07/02/2015 1003   HDL 39 (L) 07/02/2015 1003   CHOLHDL 6.1 (H) 07/02/2015 1003   CHOLHDL 3 12/10/2013 1201   VLDL 30.0 12/10/2013 1201   LDLCALC 161 (H) 07/02/2015 1003    Physical Exam:    VS:  LMP 02/18/1995     Wt Readings from Last 3 Encounters:  07/20/17 (!) 406 lb (184.2 kg)  07/02/17 (!) 406 lb (184.2 kg)  05/04/17 (!) 394 lb (178.7 kg)     GEN: *** Well nourished, well developed in no acute distress HEENT: Normal NECK: No JVD; No carotid bruits LYMPHATICS: No  lymphadenopathy CARDIAC: ***RRR, no murmurs, rubs, gallops RESPIRATORY:  Clear to auscultation without rales, wheezing or rhonchi  ABDOMEN: Soft, non-tender, non-distended MUSCULOSKELETAL:  No edema; No deformity  SKIN: Warm and dry NEUROLOGIC:  Alert and oriented x 3 PSYCHIATRIC:  Normal affect   ASSESSMENT:    No diagnosis found. PLAN:    In order of problems listed above:  ***   Medication Adjustments/Labs and Tests Ordered: Current medicines are reviewed at length with the patient today.  Concerns regarding medicines are outlined above.  No orders of the defined types were placed in this encounter.  No orders of the defined types were placed in this encounter.   Signed, Fransico Him, MD  08/02/2017 8:07 AM    [place]

## 2017-08-03 ENCOUNTER — Telehealth: Payer: Self-pay | Admitting: *Deleted

## 2017-08-03 ENCOUNTER — Telehealth: Payer: Self-pay | Admitting: Family Medicine

## 2017-08-03 NOTE — Telephone Encounter (Signed)
Opened in error

## 2017-08-03 NOTE — Telephone Encounter (Signed)
Information faxed to [facility]

## 2017-08-04 DIAGNOSIS — G4733 Obstructive sleep apnea (adult) (pediatric): Secondary | ICD-10-CM | POA: Diagnosis not present

## 2017-08-06 ENCOUNTER — Telehealth: Payer: Self-pay | Admitting: *Deleted

## 2017-08-06 NOTE — Telephone Encounter (Signed)
-----   Message from Sueanne Margarita, MD sent at 08/04/2017  4:58 PM EST ----- Regarding: RE: CPAP Titration I cannot order daytime oxygen for her- she will need to discuss this with her PCP or go back to her pulmonologist because there are certain tests she has to do to qualify for daytime oxygen and she needs to be evaluated by pulmonary.  I cannot prescribe O2 at night until she has had a CPAP titration done and see if she needs O2 once apneas are treated  Traci ----- Message ----- From: Freada Bergeron, CMA Sent: 08/02/2017   4:38 PM To: Sueanne Margarita, MD Subject: CPAP Titration                                 Call came in today from patients niece to cancel patients appointment stating patient had been in the hospital over night because she fell off the bed at home and the niece could not get her up off the floor so she called 911.  Patients niece states the patient needs 02 during the day and on CPAP because her 02 registers 67% when she wakes in the mornings. PAP assistant informed her (per DPR) that the patients appointment had been cancelled because she was supposed to have a CPAP titration scheduled instead of an office appointment.  [name] (niece) says they would like to get 02 for the patient at [facility].  Patients niece says the full face mask does not work well for the patient and she ask for a nasal pillow mask for the patient when she gets an new CPAP machine. Also the patients CPAP machine is worn out and not working properly. Pap assistant will send her titration to the pre cert dept today. Please advise,  Gae Bon

## 2017-08-06 NOTE — Telephone Encounter (Signed)
Reached out to patient and informed her that Dr Radford Pax says her PCP will have to order her 72.  Patient agreed with treatment and states her PCP ordered her 48 and she is awaiting her appointment for her titration.

## 2017-08-06 NOTE — Telephone Encounter (Signed)
RE: CPAP Titration  Allison Margarita, MD  Freada Bergeron, CMA        I cannot order daytime oxygen for her- she will need to discuss this with her PCP or go back to her pulmonologist because there are certain tests she has to do to qualify for daytime oxygen and she needs to be evaluated by pulmonary. I cannot prescribe O2 at night until she has had a CPAP titration done and see if she needs O2 once apneas are treated   Allison Sharp

## 2017-08-07 DIAGNOSIS — J449 Chronic obstructive pulmonary disease, unspecified: Secondary | ICD-10-CM | POA: Diagnosis not present

## 2017-08-07 DIAGNOSIS — G4733 Obstructive sleep apnea (adult) (pediatric): Secondary | ICD-10-CM | POA: Diagnosis not present

## 2017-08-07 DIAGNOSIS — W19XXXA Unspecified fall, initial encounter: Secondary | ICD-10-CM | POA: Diagnosis not present

## 2017-08-07 DIAGNOSIS — R2681 Unsteadiness on feet: Secondary | ICD-10-CM | POA: Diagnosis not present

## 2017-08-08 ENCOUNTER — Telehealth: Payer: Self-pay | Admitting: *Deleted

## 2017-08-08 NOTE — Telephone Encounter (Signed)
RE: pre cert  Lauralee Evener, CMA  Freada Bergeron, CMA        No PRECERT required. Appointment scheduled.for 3/18/ @ [facility]. Please notify patient.    Patient notified but wants her tittration done at [facility] instead of [facility]. Informed patient of titration sudy and verbalized understanding was indicated. Patient understands her titration study is scheduled for Thursday August 09 2017 at 8:00 pm. Patient understands her sleep titration will be done at [facility] sleep lab. Patient agrees with treatment and thanked me for call.

## 2017-08-09 ENCOUNTER — Ambulatory Visit (HOSPITAL_BASED_OUTPATIENT_CLINIC_OR_DEPARTMENT_OTHER): Payer: Medicare Other | Attending: Cardiology | Admitting: Cardiology

## 2017-08-09 DIAGNOSIS — R0683 Snoring: Secondary | ICD-10-CM | POA: Insufficient documentation

## 2017-08-09 DIAGNOSIS — I493 Ventricular premature depolarization: Secondary | ICD-10-CM | POA: Insufficient documentation

## 2017-08-09 DIAGNOSIS — Z9989 Dependence on other enabling machines and devices: Secondary | ICD-10-CM

## 2017-08-09 DIAGNOSIS — Z79899 Other long term (current) drug therapy: Secondary | ICD-10-CM | POA: Insufficient documentation

## 2017-08-09 DIAGNOSIS — G4733 Obstructive sleep apnea (adult) (pediatric): Secondary | ICD-10-CM | POA: Diagnosis not present

## 2017-08-10 DIAGNOSIS — R5381 Other malaise: Secondary | ICD-10-CM | POA: Diagnosis not present

## 2017-08-10 DIAGNOSIS — R531 Weakness: Secondary | ICD-10-CM | POA: Diagnosis not present

## 2017-08-10 DIAGNOSIS — Z9181 History of falling: Secondary | ICD-10-CM | POA: Diagnosis not present

## 2017-08-10 NOTE — Procedures (Signed)
NAME: Allison Sharp DATE OF BIRTH:  1955-09-07 MEDICAL RECORD NUMBER [id number]  LOCATION: [facility]  PHYSICIAN:    DATE OF STUDY: 08/09/2017  SLEEP STUDY TYPE: Positive Airway Pressure Titration               REFERRING PHYSICIAN: Sueanne Margarita, MD   Gender: Female D.O.B: 11-19-1955 Age (years): 68 Referring Provider: Fransico Him MD, ABSM Height (inches): 69 Interpreting Physician: Fransico Him MD, ABSM Weight (lbs): 406 RPSGT: Lanae Boast BMI: 60 MRN: [id number] Neck Size: 16.50  CLINICAL INFORMATION The patient is referred for a CPAP titration to treat sleep apnea.???????  SLEEP STUDY TECHNIQUE As per the AASM Manual for the Scoring of Sleep and Associated Events v2.3 (April 2016) with a hypopnea requiring 4% desaturations.  The channels recorded and monitored were frontal, central and occipital EEG, electrooculogram (EOG), submentalis EMG (chin), nasal and oral airflow, thoracic and abdominal wall motion, anterior tibialis EMG, snore microphone, electrocardiogram, and pulse oximetry. Continuous positive airway pressure (CPAP) was initiated at the beginning of the study and titrated to treat sleep-disordered breathing.  MEDICATIONS Medications self-administered by patient taken the night of the study : DOXEPIN HCL, CLONIDINE, DIVALPROEX SODIUM, RESPERIDONE  TECHNICIAN COMMENTS Comments added by technician: Patient had difficulty initiating sleep. Patient was restless all through the night. Comments added by scorer: N/A  RESPIRATORY PARAMETERS Optimal PAP Pressure (cm):10  AHI at Optimal Pressure (/hr):0.0 Overall Minimal O2 (%):91.0  Supine % at Optimal Pressure (%):100 Minimal O2 at Optimal Pressure (%): 91.0   SLEEP ARCHITECTURE The study was initiated at 10:17:45 PM and ended at 5:09:24 AM.  Sleep onset time was 53.1 minutes and the sleep efficiency was 66.7%%. The total sleep time was 274.5 minutes.  The  patient spent 19.7%% of the night in stage N1 sleep, 76.5%% in stage N2 sleep, 0.0%% in stage N3 and 3.82% in REM.Stage REM latency was 337.5 minutes  Wake after sleep onset was 84.0. Alpha intrusion was absent. Supine sleep was 46.57%.  CARDIAC DATA The 2 lead EKG demonstrated sinus rhythm. The mean heart rate was 79.3 beats per minute. Other EKG findings include: PVCs.  LEG MOVEMENT DATA The total Periodic Limb Movements of Sleep (PLMS) were 0. The PLMS index was 0.0. A PLMS index of <15 is considered normal in adults.  IMPRESSIONS - The optimal PAP pressure was 10 cm of water. - Central sleep apnea was not noted during this titration (CAI = 0.0/h). - Significant oxygen desaturations were not observed during this titration (min O2 = 91.0%). - The patient snored with moderate snoring volume during this titration study. - 2-lead EKG demonstrated: PVCs - Clinically significant periodic limb movements were not noted during this study. Arousals associated with PLMs were rare.  DIAGNOSIS - Obstructive Sleep Apnea (327.23 [G47.33 ICD-10])  RECOMMENDATIONS - Trial of CPAP therapy on 10 cm H2O with a Small size Resmed Full Face Mask AirFit F20 mask and heated humidification. - Avoid alcohol, sedatives and other CNS depressants that may worsen sleep apnea and disrupt normal sleep architecture. - Sleep hygiene should be reviewed to assess factors that may improve sleep quality. - Weight management and regular exercise should be initiated or continued. - Return to Sleep Center for re-evaluation after 10 weeks of therapy  [place], [place] of Sleep [facility]  ELECTRONICALLY SIGNED ON:  08/10/2017, 1:38 PM [place] PH: [phone]   FX: [phone] [place]

## 2017-08-13 ENCOUNTER — Ambulatory Visit (INDEPENDENT_AMBULATORY_CARE_PROVIDER_SITE_OTHER): Payer: Medicare Other | Admitting: Family Medicine

## 2017-08-13 ENCOUNTER — Encounter: Payer: Self-pay | Admitting: Family Medicine

## 2017-08-13 VITALS — BP 134/72 | HR 86 | Temp 96.6°F | Ht 69.0 in | Wt 393.0 lb

## 2017-08-13 DIAGNOSIS — R600 Localized edema: Secondary | ICD-10-CM

## 2017-08-13 DIAGNOSIS — J4541 Moderate persistent asthma with (acute) exacerbation: Secondary | ICD-10-CM | POA: Diagnosis not present

## 2017-08-13 DIAGNOSIS — R0902 Hypoxemia: Secondary | ICD-10-CM

## 2017-08-13 DIAGNOSIS — G4733 Obstructive sleep apnea (adult) (pediatric): Secondary | ICD-10-CM

## 2017-08-13 DIAGNOSIS — Z9989 Dependence on other enabling machines and devices: Secondary | ICD-10-CM

## 2017-08-13 DIAGNOSIS — R443 Hallucinations, unspecified: Secondary | ICD-10-CM

## 2017-08-13 NOTE — Progress Notes (Signed)
BP 134/72   Pulse 86   Temp (!) 96.6 F (35.9 C) (Oral)   Ht 5\' 9"  (1.753 m)   Wt (!) 393 lb (178.3 kg)   LMP 02/18/1995   SpO2 97%   BMI 58.04 kg/m    Subjective:    Patient ID: Allison Sharp, female    DOB: July 14, 1955, 62 y.o.   MRN: [id number]  HPI: Allison Sharp is a 62 y.o. female presenting on 08/13/2017 for 1 month follow up   HPI Sleep apnea and oxygen desaturation Patient is coming in for recheck on her sleep apnea and asthma and difficulty breathing.  Since she got her home oxygen of 2 L nasal cannula and has been using it throughout the day and started using her CPAP at night again she has not had any hallucinations and has been acting normally.  Per daughter her strength is starting to come back but she is still very weak and cannot walk long distances yet.  They said that her breathing has been doing a lot better and she is a lot more coherent and able to do well with the portable oxygen.  Peripheral edema Patient is concerned because she feels like she has swollen more over the past few weeks in both her legs and a little bit in both of her arms.  She denies any pain in her legs but says that none of her shoes will feel like they did before.  Has been much less active over the past few weeks and has had the swelling along with that inactivity.  She denies any redness or warmth.  She has bilateral weakness but that is because she has been a lot more inactive recently.  Relevant past medical, surgical, family and social history reviewed and updated as indicated. Interim medical history since our last visit reviewed. Allergies and medications reviewed and updated.  Review of Systems  Constitutional: Negative for chills and fever.  Eyes: Negative for visual disturbance.  Respiratory: Negative for chest tightness and shortness of breath.   Cardiovascular: Positive for leg swelling. Negative for chest pain.  Genitourinary: Negative for difficulty urinating and dysuria.    Musculoskeletal: Negative for back pain and gait problem.  Skin: Negative for rash.  Neurological: Positive for weakness. Negative for dizziness, light-headedness, numbness and headaches.  Psychiatric/Behavioral: Negative for agitation, behavioral problems and hallucinations.  All other systems reviewed and are negative.   Per HPI unless specifically indicated above   Allergies as of 08/13/2017      Reactions   Abilify [aripiprazole] Other (See Comments)   hallucinations   Naproxen Nausea And Vomiting, Swelling   Ativan [lorazepam] Other (See Comments)   Delirium   Haldol [haloperidol] Other (See Comments)   Hallucinating       Medication List        Accurate as of 08/13/17  3:56 PM. Always use your most recent med list.          albuterol (2.5 MG/3ML) 0.083% nebulizer solution Commonly known as:  PROVENTIL Take 3 mLs (2.5 mg total) by nebulization every 4 (four) hours as needed for wheezing or shortness of breath.   PROAIR HFA 108 (90 Base) MCG/ACT inhaler Generic drug:  albuterol USE 2 PUFF EVERY 4 HOURS AS NEEDED FOR WHEEZING OR SHORTNESS OF BREATH   atorvastatin 20 MG tablet Commonly known as:  LIPITOR TAKE 1 TABLET DAILY   benztropine 1 MG tablet Commonly known as:  COGENTIN Take 1 tablet (1 mg total) by  mouth at bedtime.   BREO ELLIPTA 100-25 MCG/INH Aepb Generic drug:  fluticasone furoate-vilanterol INHALE 1 PUFF INTO THE LUNGS DAILY   cephALEXin 500 MG capsule Commonly known as:  KEFLEX Take 1 capsule (500 mg total) by mouth 4 (four) times daily.   cetirizine 10 MG tablet Commonly known as:  ZYRTEC TAKE 1 TABLET ONCE DAILY   cloNIDine 0.2 MG tablet Commonly known as:  CATAPRES Take 0.2 mg by mouth at bedtime.   diclofenac sodium 1 % Gel Commonly known as:  VOLTAREN Apply to affected area twice daily as needed   divalproex 500 MG DR tablet Commonly known as:  DEPAKOTE Take 3 tablets (1,500 mg total) by mouth at bedtime.   DULoxetine 60 MG  capsule Commonly known as:  CYMBALTA Take 1 capsule (60 mg total) by mouth daily.   fluticasone 50 MCG/ACT nasal spray Commonly known as:  FLONASE Place 1 spray 2 (two) times daily as needed into both nostrils for allergies or rhinitis.   hydrOXYzine 50 MG tablet Commonly known as:  ATARAX/VISTARIL Take 1 tablet (50 mg total) by mouth 3 (three) times daily as needed.   levothyroxine 112 MCG tablet Commonly known as:  SYNTHROID, LEVOTHROID TAKE (1) TABLET DAILY BE- FORE BREAKFAST FOR THYROID   lisinopril-hydrochlorothiazide 20-25 MG tablet Commonly known as:  PRINZIDE,ZESTORETIC Take 1 tablet by mouth daily.   nystatin cream Commonly known as:  MYCOSTATIN APPLY TO THE AFFECTED AREA THREE TIMES DAILY AS NEEDED .Marland KitchenFOR TOPICAL USE ONLY...   omeprazole 20 MG capsule Commonly known as:  PRILOSEC TAKE (1) CAPSULE DAILY   ondansetron 4 MG disintegrating tablet Commonly known as:  ZOFRAN ODT Take 1 tablet (4 mg total) by mouth every 8 (eight) hours as needed for nausea or vomiting.   polyethylene glycol powder powder Commonly known as:  GLYCOLAX/MIRALAX Take 17 g by mouth 2 (two) times daily as needed.   promethazine 25 MG tablet Commonly known as:  PHENERGAN Take 1 tablet (25 mg total) by mouth every 8 (eight) hours as needed for nausea or vomiting.   risperidone 4 MG tablet Commonly known as:  RISPERDAL Take 0.5 tablets (2 mg total) by mouth at bedtime.   traMADol 50 MG tablet Commonly known as:  ULTRAM Take 1 tablet (50 mg total) by mouth every 8 (eight) hours as needed for moderate pain.            Durable Medical Equipment  (From admission, onward)        Start     Ordered   08/13/17 0000  For home use only DME oxygen    Question Answer Comment  Mode or (Route) Nasal cannula   Liters per Minute 2   Frequency Continuous (stationary and portable oxygen unit needed)   Oxygen conserving device Yes   Oxygen delivery system Gas      08/13/17 1541           Objective:    BP 134/72   Pulse 86   Temp (!) 96.6 F (35.9 C) (Oral)   Ht 5\' 9"  (1.753 m)   Wt (!) 393 lb (178.3 kg)   LMP 02/18/1995   SpO2 97%   BMI 58.04 kg/m   Wt Readings from Last 3 Encounters:  08/13/17 (!) 393 lb (178.3 kg)  08/09/17 (!) 396 lb (179.6 kg)  08/02/17 (!) 396 lb (179.6 kg)    Physical Exam  Constitutional: She is oriented to person, place, and time. She appears well-developed and well-nourished. No distress.  Eyes: Conjunctivae are normal.  Neck: Neck supple. No thyromegaly present.  Cardiovascular: Normal rate, regular rhythm, normal heart sounds and intact distal pulses.  No murmur heard. Pulmonary/Chest: Effort normal and breath sounds normal. No respiratory distress. She has no wheezes. She has no rales.  Musculoskeletal: Normal range of motion. She exhibits edema (Patient is very morbidly obese and a lot of fat makes of her legs and arms but there may be some swelling there but is difficult to tell, 1+ pitting edema).  Lymphadenopathy:    She has no cervical adenopathy.  Neurological: She is alert and oriented to person, place, and time. Coordination normal.  Skin: Skin is warm and dry. No rash noted. She is not diaphoretic.  Psychiatric: She has a normal mood and affect. Her behavior is normal.  Nursing note and vitals reviewed.     Assessment & Plan:   Problem List Items Addressed This Visit      Respiratory   Asthma with acute exacerbation   Relevant Orders   For home use only DME oxygen   OSA on CPAP - Primary   Relevant Orders   For home use only DME oxygen    Other Visit Diagnoses    Hallucination       Oxygen desaturation       Relevant Orders   For home use only DME oxygen   Bilateral lower extremity edema       Relevant Orders   Ambulatory referral to Physical Therapy      Follow up plan: Return if symptoms worsen or fail to improve.  Counseling provided for all of the vaccine components Orders Placed This Encounter   Procedures  . For home use only DME oxygen  . Ambulatory referral to Physical Therapy    [name], MD [facility] 08/13/2017, 3:56 PM

## 2017-08-14 ENCOUNTER — Other Ambulatory Visit: Payer: Self-pay | Admitting: Family Medicine

## 2017-08-14 DIAGNOSIS — E039 Hypothyroidism, unspecified: Secondary | ICD-10-CM

## 2017-08-20 ENCOUNTER — Telehealth: Payer: Self-pay | Admitting: *Deleted

## 2017-08-20 ENCOUNTER — Other Ambulatory Visit (HOSPITAL_COMMUNITY): Payer: Self-pay

## 2017-08-20 DIAGNOSIS — G4733 Obstructive sleep apnea (adult) (pediatric): Secondary | ICD-10-CM

## 2017-08-20 DIAGNOSIS — Z9989 Dependence on other enabling machines and devices: Principal | ICD-10-CM

## 2017-08-20 MED ORDER — RISPERIDONE 4 MG PO TABS: 4.0000 mg | ORAL_TABLET | Freq: Every day | ORAL | 0 refills | Status: DC

## 2017-08-20 NOTE — Telephone Encounter (Signed)
-----   Message from Sueanne Margarita, MD sent at 08/10/2017  1:40 PM EDT ----- Please let patient know that they had a successful PAP titration and let DME know that orders are in EPIC.  Please set up 10 week OV with me.

## 2017-08-20 NOTE — Telephone Encounter (Signed)
Informed patient of CPAP titration results and verbalized understanding was indicated. Patient understands she had a successful PAP titration and doctor Radford Pax has ordered her a CPAP. Patient understands she will be contacted by [facility] to set up her cpap. She understands to call if [facility] does not contact her with new setup in a timely manner. She understands she will be called once confirmation has been received from [facility] that she has received her new machine to schedule 10 week follow up appointment.  [facility] notified of new cpap order in epic Please add to Allison Sharp She was grateful for the call and thanked me

## 2017-08-23 ENCOUNTER — Other Ambulatory Visit (HOSPITAL_COMMUNITY): Payer: Self-pay | Admitting: Psychiatry

## 2017-08-23 ENCOUNTER — Other Ambulatory Visit: Payer: Self-pay | Admitting: Family Medicine

## 2017-08-24 ENCOUNTER — Telehealth: Payer: Self-pay | Admitting: Family Medicine

## 2017-08-24 DIAGNOSIS — R2689 Other abnormalities of gait and mobility: Secondary | ICD-10-CM

## 2017-08-24 DIAGNOSIS — R296 Repeated falls: Secondary | ICD-10-CM

## 2017-08-24 NOTE — Telephone Encounter (Signed)
Pt requesting shower chair Please review and advise

## 2017-08-24 NOTE — Telephone Encounter (Signed)
Aware script sent in

## 2017-08-24 NOTE — Telephone Encounter (Signed)
Yes we can go ahead and write her for shower chair, diagnosis decreased functional mobility and recurrent falls

## 2017-08-27 ENCOUNTER — Other Ambulatory Visit (HOSPITAL_COMMUNITY): Payer: Self-pay

## 2017-08-27 MED ORDER — BENZTROPINE MESYLATE 1 MG PO TABS: 1.0000 mg | ORAL_TABLET | Freq: Every day | ORAL | 0 refills | Status: DC

## 2017-08-29 ENCOUNTER — Telehealth (HOSPITAL_COMMUNITY): Payer: Self-pay

## 2017-08-29 MED ORDER — RISPERIDONE 4 MG PO TABS: 4.0000 mg | ORAL_TABLET | Freq: Every day | ORAL | 0 refills | Status: DC

## 2017-08-29 MED ORDER — BENZTROPINE MESYLATE 1 MG PO TABS: 1.0000 mg | ORAL_TABLET | Freq: Every day | ORAL | 0 refills | Status: DC

## 2017-08-29 NOTE — Telephone Encounter (Signed)
I talked to her niece.  Patient recently had sleep study and she is hoping to start CPAP machine soon.  She is back on Risperdal 4 mg and need a new prescription.  She is not sure if Cogentin is a strong because she still have tremors.  I recommended to stay on 1 mg because higher dose can cause dry mouth.  We will discuss dose adjustment or her next follow-up appointment.

## 2017-08-29 NOTE — Telephone Encounter (Signed)
Patient's niece called again today, she needs you to call her back. I resent the Cogentin to [facility], it went to the wrong pharmacy. [phone]

## 2017-08-30 NOTE — Telephone Encounter (Signed)
Brain (RT)  at [facility] says patient does not need a 10 week follow up visit and she received her CPAP in April 2017. [facility] is acting as her DME. If any compliance reports are needed they can be found in [place].

## 2017-08-30 NOTE — Telephone Encounter (Signed)
Allison Sharp at [facility] says patient only has order for supplies at [facility]. Her DME is at [facility].Patient is not eligible for a new CPAP until 2022.Marland Kitchen

## 2017-09-04 ENCOUNTER — Telehealth: Payer: Self-pay | Admitting: Family Medicine

## 2017-09-04 DIAGNOSIS — G4733 Obstructive sleep apnea (adult) (pediatric): Secondary | ICD-10-CM | POA: Diagnosis not present

## 2017-09-05 ENCOUNTER — Other Ambulatory Visit (HOSPITAL_COMMUNITY): Payer: Self-pay

## 2017-09-05 MED ORDER — BENZTROPINE MESYLATE 1 MG PO TABS: 1.0000 mg | ORAL_TABLET | Freq: Every day | ORAL | 0 refills | Status: DC

## 2017-09-05 MED ORDER — CELECOXIB 50 MG PO CAPS: 50.0000 mg | ORAL_CAPSULE | Freq: Two times a day (BID) | ORAL | 2 refills | Status: DC

## 2017-09-05 NOTE — Telephone Encounter (Signed)
Pt aware.

## 2017-09-06 ENCOUNTER — Other Ambulatory Visit: Payer: Self-pay | Admitting: Family Medicine

## 2017-09-06 ENCOUNTER — Other Ambulatory Visit (HOSPITAL_COMMUNITY): Payer: Self-pay | Admitting: Psychiatry

## 2017-09-06 ENCOUNTER — Telehealth: Payer: Self-pay | Admitting: Family Medicine

## 2017-09-06 DIAGNOSIS — F3160 Bipolar disorder, current episode mixed, unspecified: Secondary | ICD-10-CM

## 2017-09-06 DIAGNOSIS — F3131 Bipolar disorder, current episode depressed, mild: Secondary | ICD-10-CM

## 2017-09-07 ENCOUNTER — Telehealth: Payer: Self-pay | Admitting: Family Medicine

## 2017-09-07 ENCOUNTER — Other Ambulatory Visit: Payer: Self-pay

## 2017-09-07 DIAGNOSIS — G4733 Obstructive sleep apnea (adult) (pediatric): Secondary | ICD-10-CM | POA: Diagnosis not present

## 2017-09-07 DIAGNOSIS — F3131 Bipolar disorder, current episode depressed, mild: Secondary | ICD-10-CM

## 2017-09-07 DIAGNOSIS — F3160 Bipolar disorder, current episode mixed, unspecified: Secondary | ICD-10-CM

## 2017-09-07 MED ORDER — DIVALPROEX SODIUM 500 MG PO DR TAB: 1500.0000 mg | DELAYED_RELEASE_TABLET | Freq: Every day | ORAL | 1 refills | Status: DC

## 2017-09-07 MED ORDER — CELECOXIB 200 MG PO CAPS: 200.0000 mg | ORAL_CAPSULE | Freq: Two times a day (BID) | ORAL | 2 refills | Status: DC

## 2017-09-07 MED ORDER — CELECOXIB 50 MG PO CAPS: 50.0000 mg | ORAL_CAPSULE | Freq: Two times a day (BID) | ORAL | 2 refills | Status: DC

## 2017-09-07 NOTE — Telephone Encounter (Signed)
Per Stu at [facility], patient has been on Celebrex 200 mg bid, prescription for 50 mg was sent in earlier today.  Needs to be corrected.

## 2017-09-07 NOTE — Addendum Note (Signed)
Addended by: Freada Bergeron on: 09/07/2017 04:05 PM   Modules accepted: Orders

## 2017-09-07 NOTE — Telephone Encounter (Signed)
Last seen 08/13/17  Dr Dettinger

## 2017-09-07 NOTE — Telephone Encounter (Signed)
Sent new prescription to [facility] and contacted [name] to let her know.

## 2017-09-09 DIAGNOSIS — G4733 Obstructive sleep apnea (adult) (pediatric): Secondary | ICD-10-CM | POA: Diagnosis not present

## 2017-09-10 DIAGNOSIS — Z9181 History of falling: Secondary | ICD-10-CM | POA: Diagnosis not present

## 2017-09-10 DIAGNOSIS — R5381 Other malaise: Secondary | ICD-10-CM | POA: Diagnosis not present

## 2017-09-10 DIAGNOSIS — R531 Weakness: Secondary | ICD-10-CM | POA: Diagnosis not present

## 2017-09-12 ENCOUNTER — Other Ambulatory Visit (HOSPITAL_COMMUNITY): Payer: Self-pay | Admitting: Psychiatry

## 2017-09-14 NOTE — Telephone Encounter (Signed)
Patient has changed her DME to [facility]. Patient called asking for an order to be sent to [facility] for a new oxygen concentrator. Order sent to [facility]. To advise. Per [facility] please write prescription for the patient for a  Portable concentrator evaluation for a Simply GO Portable Concentrator on wheels in place the home fill unit.

## 2017-09-17 ENCOUNTER — Telehealth: Payer: Self-pay | Admitting: Family Medicine

## 2017-09-17 NOTE — Telephone Encounter (Signed)
Routed to [facility]

## 2017-09-19 ENCOUNTER — Telehealth: Payer: Self-pay | Admitting: Family Medicine

## 2017-09-19 NOTE — Telephone Encounter (Signed)
Pt currently has stationary and portable O2 but is requesting O2 concentrator Please advise

## 2017-09-19 NOTE — Telephone Encounter (Signed)
  Sueanne Margarita, MD  Freada Bergeron, CMA        This goes to PCP not me   Allison Sharp      ----- Message -----  From: Freada Bergeron, CMA  Sent: 09/14/2017 11:36 AM  To: Sueanne Margarita, MD  Subject: Order                       Per [facility] please write prescription for the patient for a  Portable concentrator evaluation for a Simply GO Portable Concentrator on wheels in place the home fill unit.   Please advise

## 2017-09-19 NOTE — Telephone Encounter (Signed)
Called left message that patients 02 concentrator has to come from her PCP not Dr Radford Pax.

## 2017-09-19 NOTE — Telephone Encounter (Signed)
Rx placed on providers desk

## 2017-09-20 NOTE — Telephone Encounter (Signed)
Aware Rx faxed to [facility] today

## 2017-09-26 ENCOUNTER — Encounter (HOSPITAL_COMMUNITY): Payer: Self-pay | Admitting: Emergency Medicine

## 2017-09-26 ENCOUNTER — Ambulatory Visit (HOSPITAL_COMMUNITY): Payer: Medicare Other | Admitting: Psychiatry

## 2017-09-26 ENCOUNTER — Emergency Department (HOSPITAL_COMMUNITY): Payer: Medicare Other

## 2017-09-26 ENCOUNTER — Other Ambulatory Visit: Payer: Self-pay

## 2017-09-26 ENCOUNTER — Inpatient Hospital Stay (HOSPITAL_COMMUNITY)
Admission: EM | Admit: 2017-09-26 | Discharge: 2017-09-30 | DRG: 071 | Disposition: A | Discharge status: Home Health | Payer: Medicare Other | Attending: Internal Medicine | Admitting: Internal Medicine

## 2017-09-26 ENCOUNTER — Other Ambulatory Visit: Payer: Self-pay | Admitting: Family Medicine

## 2017-09-26 DIAGNOSIS — R450 Nervousness: Secondary | ICD-10-CM | POA: Diagnosis not present

## 2017-09-26 DIAGNOSIS — G8929 Other chronic pain: Secondary | ICD-10-CM | POA: Diagnosis not present

## 2017-09-26 DIAGNOSIS — R0602 Shortness of breath: Secondary | ICD-10-CM | POA: Diagnosis not present

## 2017-09-26 DIAGNOSIS — G9340 Encephalopathy, unspecified: Secondary | ICD-10-CM | POA: Diagnosis not present

## 2017-09-26 DIAGNOSIS — I5042 Chronic combined systolic (congestive) and diastolic (congestive) heart failure: Secondary | ICD-10-CM | POA: Diagnosis present

## 2017-09-26 DIAGNOSIS — Z818 Family history of other mental and behavioral disorders: Secondary | ICD-10-CM | POA: Diagnosis not present

## 2017-09-26 DIAGNOSIS — F419 Anxiety disorder, unspecified: Secondary | ICD-10-CM | POA: Diagnosis not present

## 2017-09-26 DIAGNOSIS — M549 Dorsalgia, unspecified: Secondary | ICD-10-CM | POA: Diagnosis not present

## 2017-09-26 DIAGNOSIS — D696 Thrombocytopenia, unspecified: Secondary | ICD-10-CM | POA: Diagnosis not present

## 2017-09-26 DIAGNOSIS — Z9989 Dependence on other enabling machines and devices: Secondary | ICD-10-CM

## 2017-09-26 DIAGNOSIS — Z96653 Presence of artificial knee joint, bilateral: Secondary | ICD-10-CM | POA: Diagnosis present

## 2017-09-26 DIAGNOSIS — R269 Unspecified abnormalities of gait and mobility: Secondary | ICD-10-CM | POA: Diagnosis present

## 2017-09-26 DIAGNOSIS — R440 Auditory hallucinations: Secondary | ICD-10-CM | POA: Diagnosis present

## 2017-09-26 DIAGNOSIS — R410 Disorientation, unspecified: Secondary | ICD-10-CM

## 2017-09-26 DIAGNOSIS — N179 Acute kidney failure, unspecified: Secondary | ICD-10-CM | POA: Diagnosis present

## 2017-09-26 DIAGNOSIS — I110 Hypertensive heart disease with heart failure: Secondary | ICD-10-CM | POA: Diagnosis present

## 2017-09-26 DIAGNOSIS — F319 Bipolar disorder, unspecified: Secondary | ICD-10-CM | POA: Diagnosis present

## 2017-09-26 DIAGNOSIS — G9349 Other encephalopathy: Principal | ICD-10-CM | POA: Diagnosis present

## 2017-09-26 DIAGNOSIS — I252 Old myocardial infarction: Secondary | ICD-10-CM

## 2017-09-26 DIAGNOSIS — Z7951 Long term (current) use of inhaled steroids: Secondary | ICD-10-CM

## 2017-09-26 DIAGNOSIS — E7800 Pure hypercholesterolemia, unspecified: Secondary | ICD-10-CM | POA: Diagnosis not present

## 2017-09-26 DIAGNOSIS — Z6841 Body Mass Index (BMI) 40.0 and over, adult: Secondary | ICD-10-CM | POA: Diagnosis not present

## 2017-09-26 DIAGNOSIS — R441 Visual hallucinations: Secondary | ICD-10-CM | POA: Diagnosis present

## 2017-09-26 DIAGNOSIS — I10 Essential (primary) hypertension: Secondary | ICD-10-CM | POA: Diagnosis not present

## 2017-09-26 DIAGNOSIS — B9620 Unspecified Escherichia coli [E. coli] as the cause of diseases classified elsewhere: Secondary | ICD-10-CM | POA: Diagnosis not present

## 2017-09-26 DIAGNOSIS — R413 Other amnesia: Secondary | ICD-10-CM | POA: Diagnosis not present

## 2017-09-26 DIAGNOSIS — I2510 Atherosclerotic heart disease of native coronary artery without angina pectoris: Secondary | ICD-10-CM | POA: Diagnosis not present

## 2017-09-26 DIAGNOSIS — J449 Chronic obstructive pulmonary disease, unspecified: Secondary | ICD-10-CM | POA: Diagnosis not present

## 2017-09-26 DIAGNOSIS — M1990 Unspecified osteoarthritis, unspecified site: Secondary | ICD-10-CM | POA: Diagnosis not present

## 2017-09-26 DIAGNOSIS — E785 Hyperlipidemia, unspecified: Secondary | ICD-10-CM | POA: Diagnosis present

## 2017-09-26 DIAGNOSIS — Z79899 Other long term (current) drug therapy: Secondary | ICD-10-CM

## 2017-09-26 DIAGNOSIS — G4733 Obstructive sleep apnea (adult) (pediatric): Secondary | ICD-10-CM | POA: Diagnosis present

## 2017-09-26 DIAGNOSIS — Z1623 Resistance to quinolones and fluoroquinolones: Secondary | ICD-10-CM | POA: Diagnosis present

## 2017-09-26 DIAGNOSIS — E860 Dehydration: Secondary | ICD-10-CM | POA: Diagnosis present

## 2017-09-26 DIAGNOSIS — M791 Myalgia, unspecified site: Secondary | ICD-10-CM | POA: Diagnosis not present

## 2017-09-26 DIAGNOSIS — E039 Hypothyroidism, unspecified: Secondary | ICD-10-CM | POA: Diagnosis present

## 2017-09-26 DIAGNOSIS — K219 Gastro-esophageal reflux disease without esophagitis: Secondary | ICD-10-CM | POA: Diagnosis not present

## 2017-09-26 DIAGNOSIS — N390 Urinary tract infection, site not specified: Secondary | ICD-10-CM | POA: Diagnosis present

## 2017-09-26 DIAGNOSIS — Z886 Allergy status to analgesic agent status: Secondary | ICD-10-CM

## 2017-09-26 DIAGNOSIS — F4310 Post-traumatic stress disorder, unspecified: Secondary | ICD-10-CM | POA: Diagnosis present

## 2017-09-26 DIAGNOSIS — Z9981 Dependence on supplemental oxygen: Secondary | ICD-10-CM

## 2017-09-26 DIAGNOSIS — Z888 Allergy status to other drugs, medicaments and biological substances status: Secondary | ICD-10-CM

## 2017-09-26 DIAGNOSIS — Z885 Allergy status to narcotic agent status: Secondary | ICD-10-CM

## 2017-09-26 LAB — COMPREHENSIVE METABOLIC PANEL
ALK PHOS: 52 U/L (ref 38–126)
ALT: 8 U/L — AB (ref 14–54)
AST: 17 U/L (ref 15–41)
Albumin: 3.5 g/dL (ref 3.5–5.0)
Anion gap: 11 (ref 5–15)
BILIRUBIN TOTAL: 0.7 mg/dL (ref 0.3–1.2)
BUN: 27 mg/dL — AB (ref 6–20)
CALCIUM: 9.5 mg/dL (ref 8.9–10.3)
CO2: 27 mmol/L (ref 22–32)
CREATININE: 1.2 mg/dL — AB (ref 0.44–1.00)
Chloride: 99 mmol/L — ABNORMAL LOW (ref 101–111)
GFR calc Af Amer: 55 mL/min — ABNORMAL LOW (ref >60)
GFR, EST NON AFRICAN AMERICAN: 47 mL/min — AB (ref >60)
GLUCOSE: 131 mg/dL — AB (ref 65–99)
POTASSIUM: 3.7 mmol/L (ref 3.5–5.1)
Sodium: 137 mmol/L (ref 135–145)
TOTAL PROTEIN: 6.9 g/dL (ref 6.5–8.1)

## 2017-09-26 LAB — URINALYSIS, ROUTINE W REFLEX MICROSCOPIC
BILIRUBIN URINE: NEGATIVE
Glucose, UA: NEGATIVE mg/dL
Hgb urine dipstick: NEGATIVE
KETONES UR: NEGATIVE mg/dL
Nitrite: POSITIVE — AB
Protein, ur: NEGATIVE mg/dL
SPECIFIC GRAVITY, URINE: 1.018 (ref 1.005–1.030)
WBC, UA: >50 WBC/hpf — ABNORMAL HIGH (ref 0–5)
pH: 5 (ref 5.0–8.0)

## 2017-09-26 LAB — BLOOD GAS, ARTERIAL
ACID-BASE EXCESS: 3.9 mmol/L — AB (ref 0.0–2.0)
BICARBONATE: 27.2 mmol/L (ref 20.0–28.0)
Drawn by: 308601
O2 CONTENT: 2 L/min
O2 SAT: 96.3 %
PATIENT TEMPERATURE: 98.6
PO2 ART: 80 mmHg — AB (ref 83.0–108.0)
pCO2 arterial: 37.9 mmHg (ref 32.0–48.0)
pH, Arterial: 7.47 — ABNORMAL HIGH (ref 7.350–7.450)

## 2017-09-26 LAB — CBC
HCT: 38.1 % (ref 36.0–46.0)
Hemoglobin: 12.8 g/dL (ref 12.0–15.0)
MCH: 31.8 pg (ref 26.0–34.0)
MCHC: 33.6 g/dL (ref 30.0–36.0)
MCV: 94.8 fL (ref 78.0–100.0)
PLATELETS: 108 10*3/uL — AB (ref 150–400)
RBC: 4.02 MIL/uL (ref 3.87–5.11)
RDW: 12.9 % (ref 11.5–15.5)
WBC: 7.8 10*3/uL (ref 4.0–10.5)

## 2017-09-26 LAB — RAPID URINE DRUG SCREEN, HOSP PERFORMED
Amphetamines: NOT DETECTED
BENZODIAZEPINES: NOT DETECTED
Barbiturates: NOT DETECTED
COCAINE: NOT DETECTED
Opiates: NOT DETECTED
Tetrahydrocannabinol: NOT DETECTED

## 2017-09-26 LAB — CBG MONITORING, ED: GLUCOSE-CAPILLARY: 138 mg/dL — AB (ref 65–99)

## 2017-09-26 LAB — ETHANOL: Alcohol, Ethyl (B): <10 mg/dL (ref <10)

## 2017-09-26 MED ORDER — SODIUM CHLORIDE 0.9 % IV SOLN
2.0000 g | Freq: Every day | INTRAVENOUS | Status: DC
  Administered 2017-09-27 – 2017-09-29 (×4): 2 g via INTRAVENOUS
  Filled 2017-09-26 (×3): qty 2
  Filled 2017-09-26 (×2): qty 20

## 2017-09-26 MED ORDER — SODIUM CHLORIDE 0.9 % IV BOLUS
1000.0000 mL | Freq: Once | INTRAVENOUS | Status: AC
  Administered 2017-09-26: 1000 mL via INTRAVENOUS

## 2017-09-26 NOTE — ED Provider Notes (Signed)
I saw and evaluated the patient, reviewed the resident's note and I agree with the findings and plan with the following exceptions.   62 yo F here with a couple months of progressively worsening delusions and hallucinations and been seeing primary psychiatrist who planned for medicine titration but couldn't make it to the appointment today because of generaslized weakness that had been going on fo rthe last few days.   Exam benign. But she is obese. Slightly tachycardic. Disoriented to situation. Still hallucinating. Will medically clear and consult TTS.   UTI. Will admit for her antibiotics    EKG Interpretation  Date/Time:  Wednesday Sep 26 2017 23:16:58 EDT Ventricular Rate:  99 PR Interval:    QRS Duration: 108 QT Interval:  367 QTC Calculation: 471 R Axis:   35 Text Interpretation:  Sinus rhythm Consider left atrial enlargement Borderline T wave abnormalities Confirmed by Merrily Pew [phone]) on 09/26/2017 11:30:23 PM         Lalaine Overstreet, Corene Cornea, MD 09/27/17 [phone]

## 2017-09-26 NOTE — ED Triage Notes (Signed)
Pt family reports that pt has been having increasing episodes of confusion for the last 2 weeks. Pt reports to having hallucinations but has stopped listening to them. Pt family reports that pt had similar symptoms when she was seen for hypoxia. Pt currently on 2L O2 prior to arrival.

## 2017-09-26 NOTE — ED Notes (Signed)
Bed: [facility]PT3 Expected date:  Expected time:  Means of arrival:  Comments: 

## 2017-09-26 NOTE — ED Notes (Signed)
Pt ambulatory to the bathroom with 2 person assist

## 2017-09-26 NOTE — Progress Notes (Signed)
Pharmacy Antibiotic Note  Allison Sharp is a 62 y.o. female admitted on 09/26/2017 with UTI.  Pharmacy has been consulted for Rocephin dosing.  Plan: Rocephin 2 gm IV q24h for UTI (BMI=59) Rx will sign off as no further adjustments necessary  Height: 5\' 9"  (175.3 cm) Weight: (!) 400 lb (181.4 kg) IBW/kg (Calculated) : 66.2  Temp (24hrs), Avg:98.2 F (36.8 C), Min:98.2 F (36.8 C), Max:98.2 F (36.8 C)  Recent Labs  Lab 09/26/17 2110  WBC 7.8  CREATININE 1.20*    Estimated Creatinine Clearance: 85.1 mL/min (A) (by C-G formula based on SCr of 1.2 mg/dL (H)).    Allergies  Allergen Reactions  . Abilify [Aripiprazole] Other (See Comments)    hallucinations  . Naproxen Nausea And Vomiting and Swelling  . Ativan [Lorazepam] Other (See Comments)    Delirium  . Haldol [Haloperidol] Other (See Comments)    Hallucinating   . Hydroxyzine Other (See Comments)    hallucinations    Antimicrobials this admission: 5/2 rocephin >>    >>   Dose adjustments this admission:   Microbiology results:  BCx:   UCx:    Sputum:    MRSA PCR:   Thank you for allowing pharmacy to be a part of this patient's care.  Dorrene German 09/26/2017 11:45 PM

## 2017-09-26 NOTE — ED Provider Notes (Addendum)
[facility] DEPT Provider Note   CSN: [id number] Arrival date & time: 09/26/17  2011     History   Chief Complaint Chief Complaint  Patient presents with  . Hallucinations  . Altered Mental Status    HPI Allison Sharp is a 62 y.o. female. With HFrEF, COPD on 2L at home, CAD, bipolar 1 disorder on cymbalta and depakote, OSA on cpap, asthma who presents with acute encephalopathy, and auditory hallucinations. Patient states that she has noticed that she has been hearing voices more frequently and has been urinating on herself more lately. Patient's niece at bedside noted that the patient has been forgetting basic tasks and been agitated towards her recently, which is out of the norm for the patient. The patient has also ran out of her urinary incontinence pads earlier than she usually does.   The patient is being managed for her hallucinations by Dr. Marchia Bond who suggested that the patient get cpap titration before adjusting her psychiatric medication. the patient was also evaluated for oxygen requirement and started on 2L at home. The patient was originally scheduled for a follow up visit today 5/1, but her niece was unable to bring the patient to appointment due to her own health problems. When niece called Dr. Hurman Horn office she was told to come to the emergency room.    Past Medical History:  Diagnosis Date  . Allergy   . Anxiety   . Arthritis   . Asthma   . Bipolar 1 disorder ([place])   . CAD (coronary artery disease)   . Cellulitis   . CHF (congestive heart failure) (HCC)    diastolic dysfunction  . Chronic back pain   . Chronic headaches   . Complication of anesthesia    States she typically gets sick s/p anesthesia  . Contusion of sacrum   . COPD (chronic obstructive pulmonary disease) ([place])   . Depression   . Dyspnea    PFT 03/05/09 FEV1 2.77(98%), FVC 3.25(86%), FEV1% 85, TLC 5.88(99%), DLCO 60% ,  Methacholine challenge 03/16/09 normal ,   CT chest 03/12/09 no pulmonary disease  . Fungal infection   . GERD (gastroesophageal reflux disease)   . History of colonoscopy 10/17/2002   by Dr Rehman-> distal non-specific proctitis, small ext hemorrhoids,   . HTN (hypertension)   . Hyperlipidemia   . Hyperthyroidism   . Hypothyroidism    States she only has hyperthyroidism  . Migraine headache   . Morbid obesity with body mass index of 50.0-59.9 in adult [facility]) JAN 2011 370 LBS   2004 311 BMI 45.9  . Myocardial infarction ([place])    NOV 1997  . OSA on CPAP    she had been on 2L O2 at night but that was stopped  . PONV (postoperative nausea and vomiting)   . PTSD (post-traumatic stress disorder)   . Suicidal ideation   . Urine incontinence   . Vitamin D deficiency     Patient Active Problem List   Diagnosis Date Noted  . Acute encephalopathy 09/26/2017  . Urinary tract infection 09/26/2017  . Bipolar I disorder, most recent episode depressed, severe w psychosis ([place]) 06/21/2016  . History of MI (myocardial infarction) 06/21/2016  . Spinal stenosis of lumbar region with neurogenic claudication 03/29/2016  . Severe obesity (BMI >= 40) ([place]) 07/05/2015  . Hyperlipidemia LDL goal <130 07/05/2015  . MDD (major depressive disorder), single episode, severe with psychotic features ([place]) 01/21/2015  . 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 Bilateral  knee pain 01/07/2014  . Scar condition and fibrosis of skin 12/11/2013  . Essential hypertension, benign 07/01/2013  . Asthma with acute exacerbation 11/04/2012  . OSA on CPAP 11/04/2012  . Back pain, chronic 08/27/2012  . Vitamin D insufficiency 04/04/2012  . Insomnia due to mental disorder 04/04/2012  . Anxiety 04/04/2012  . OCD (obsessive compulsive disorder) 04/04/2012  . PTSD (post-traumatic stress disorder) 06/28/2011  . Coronary artery disease 08/26/2010  . FATTY LIVER DISEASE 03/29/2009  . GERD 03/01/2009  . Morbid obesity ([place]) 02/15/2009  . Hypothyroidism 02/12/2009     Past Surgical History:  Procedure Laterality Date  . ABDOMINAL HYSTERECTOMY     sept 1996  . APPENDECTOMY    . BACK SURGERY  2008  . CARDIAC CATHETERIZATION     nov 1997  . CHOLECYSTECTOMY    . COLONOSCOPY  10/17/2002    Distal proctitis, small external hemorrhoids, otherwise/  normal colonoscopy. Suspect rectal bleeding secondary to hemorrhoids  . ESOPHAGOGASTRODUODENOSCOPY  03/18/09   fundic gland polyps/mild gastritis  . HERNIA REPAIR  1978  . JOINT REPLACEMENT     bil knee replacement  . KNEE ARTHROSCOPY    . MULTIPLE EXTRACTIONS WITH ALVEOLOPLASTY N/A 08/16/2015   Procedure: EXTRACTION OF TEETH THREE, SIX, EIGHT, NINE, ELEVEN, FOURTEEN, FIFTEEN, TWENTY-EIGHT WITH ALVEOLOPLASTY;  Surgeon: Diona Browner, DDS;  Location: [facility];  Service: Oral Surgery;  Laterality: N/A;  . TONSILLECTOMY    . TOTAL VAGINAL HYSTERECTOMY    . TUBAL LIGATION       OB History   None      Home Medications    Prior to Admission medications   Medication Sig Start Date End Date Taking? Authorizing Provider  atorvastatin (LIPITOR) 20 MG tablet TAKE 1 TABLET DAILY 06/29/17  Yes Dettinger, Fransisca Kaufmann, MD  benztropine (COGENTIN) 1 MG tablet Take 1 tablet (1 mg total) by mouth at bedtime. 09/05/17 09/05/18 Yes Arfeen, Arlyce Harman, MD  celecoxib (CELEBREX) 200 MG capsule Take 1 capsule (200 mg total) by mouth 2 (two) times daily. 09/07/17  Yes Dettinger, Fransisca Kaufmann, MD  cetirizine (ZYRTEC) 10 MG tablet TAKE 1 TABLET ONCE DAILY 08/22/16  Yes Dettinger, Fransisca Kaufmann, MD  cloNIDine (CATAPRES) 0.1 MG tablet Take 0.1 mg by mouth at bedtime.  09/06/17  Yes [provider]  divalproex (DEPAKOTE) 500 MG DR tablet Take 3 tablets (1,500 mg total) by mouth at bedtime. 09/07/17  Yes Dettinger, Fransisca Kaufmann, MD  DULoxetine (CYMBALTA) 60 MG capsule Take 1 capsule (60 mg total) by mouth daily. 07/27/17  Yes Arfeen, Arlyce Harman, MD  levothyroxine (SYNTHROID, LEVOTHROID) 112 MCG tablet TAKE (1) TABLET DAILY BE- FORE BREAKFAST FOR THYROID  08/14/17  Yes Dettinger, Fransisca Kaufmann, MD  lisinopril-hydrochlorothiazide (PRINZIDE,ZESTORETIC) 20-25 MG tablet Take 1 tablet by mouth daily. 03/23/17  Yes Dettinger, Fransisca Kaufmann, MD  omeprazole (PRILOSEC) 20 MG capsule TAKE (1) CAPSULE DAILY 09/06/17  Yes Dettinger, Fransisca Kaufmann, MD  polyethylene glycol powder (GLYCOLAX/MIRALAX) powder Take 17 g by mouth 2 (two) times daily as needed. Patient taking differently: Take 17 g by mouth 2 (two) times daily as needed for moderate constipation.  06/07/17  Yes Dettinger, Fransisca Kaufmann, MD  risperidone (RISPERDAL) 4 MG tablet Take 1 tablet (4 mg total) by mouth at bedtime. 08/29/17 08/29/18 Yes Arfeen, Arlyce Harman, MD  traMADol (ULTRAM) 50 MG tablet Take 1 tablet (50 mg total) by mouth every 8 (eight) hours as needed for moderate pain. 07/20/17  Yes Terald Sleeper, PA-C  albuterol (PROVENTIL) (2.5 MG/3ML)  0.083% nebulizer solution Take 3 mLs (2.5 mg total) by nebulization every 4 (four) hours as needed for wheezing or shortness of breath. 06/02/16   Dettinger, Fransisca Kaufmann, MD  BREO ELLIPTA 100-25 MCG/INH AEPB INHALE 1 PUFF INTO THE LUNGS DAILY Patient taking differently: Inhale 1 puff into the lungs daily as needed (sob and wheezing).  07/24/16   Dettinger, Fransisca Kaufmann, MD  diclofenac sodium (VOLTAREN) 1 % GEL Apply to affected area twice daily as needed Patient taking differently: Apply 2 g topically 2 (two) times daily as needed (PAIN).  03/15/16   Dettinger, Fransisca Kaufmann, MD  fluticasone (FLONASE) 50 MCG/ACT nasal spray Place 1 spray 2 (two) times daily as needed into both nostrils for allergies or rhinitis. 04/16/17   Terald Sleeper, PA-C  nystatin cream (MYCOSTATIN) APPLY TO THE AFFECTED AREA THREE TIMES DAILY AS NEEDED .Marland KitchenFOR TOPICAL USE ONLY... 02/20/17   Dettinger, Fransisca Kaufmann, MD  ondansetron (ZOFRAN ODT) 4 MG disintegrating tablet Take 1 tablet (4 mg total) by mouth every 8 (eight) hours as needed for nausea or vomiting. 07/05/17   Dettinger, Fransisca Kaufmann, MD  PROAIR HFA 108 [phone] Base) MCG/ACT inhaler  USE 2 PUFF EVERY 4 HOURS AS NEEDED FOR WHEEZING OR SHORTNESS OF BREATH 07/24/16   Dettinger, Fransisca Kaufmann, MD  promethazine (PHENERGAN) 25 MG tablet Take 1 tablet (25 mg total) by mouth every 8 (eight) hours as needed for nausea or vomiting. 07/05/17   Dettinger, Fransisca Kaufmann, MD    Family History Family History  Problem Relation Age of Onset  . Hypertension Mother   . Bipolar disorder Mother   . Dementia Mother   . Depression Mother   . Heart attack Mother   . AAA (abdominal aortic aneurysm) Mother   . Coronary artery disease Father   . Alcohol abuse Father   . Hypertension Brother   . Coronary artery disease Brother   . Bipolar disorder Brother   . Depression Brother   . Depression Sister   . Paranoid behavior Sister   . Cancer Sister        breast  . Bipolar disorder Sister   . Depression Sister   . Hypertension Sister   . Cancer Son        thyroid  . Cancer Maternal Aunt        breast metastatized to brain  . Anesthesia problems Neg Hx   . Hypotension Neg Hx   . Malignant hyperthermia Neg Hx   . Pseudochol deficiency Neg Hx     Social History Social History   Tobacco Use  . Smoking status: Never Smoker  . Smokeless tobacco: Never Used  Substance Use Topics  . Alcohol use: No    Alcohol/week: 0.0 oz  . Drug use: No     Allergies   Abilify [aripiprazole]; Naproxen; Ativan [lorazepam]; Haldol [haloperidol]; and Hydroxyzine   Review of Systems Review of Systems  Respiratory: Positive for shortness of breath.   Genitourinary: Positive for difficulty urinating and dysuria.   Physical Exam Updated Vital Signs BP 126/68   Pulse (!) 102   Temp 98.2 F (36.8 C) (Oral)   Resp 20   Ht 5\' 9"  (1.753 m)   Wt (!) 181.4 kg (400 lb)   LMP 02/18/1995   SpO2 97%   BMI 59.07 kg/m   Physical Exam  Constitutional: She appears well-developed and well-nourished. No distress.  HENT:  Head: Normocephalic and atraumatic.  Eyes: Pupils are equal, round, and reactive to light.   Cardiovascular:  Normal rate, regular rhythm and normal heart sounds.  Pulmonary/Chest: Effort normal and breath sounds normal. No stridor. No respiratory distress.  Abdominal: Soft. Bowel sounds are normal. She exhibits no distension. There is tenderness (suprapubic).  Neurological: She is alert. No cranial nerve deficit.  Skin: She is not diaphoretic.     ED Treatments / Results  Labs (all labs ordered are listed, but only abnormal results are displayed) Labs Reviewed  COMPREHENSIVE METABOLIC PANEL - Abnormal; Notable for the following components:      Result Value   Chloride 99 (*)    Glucose, Bld 131 (*)    BUN 27 (*)    Creatinine, Ser 1.20 (*)    ALT 8 (*)    GFR calc non Af Amer 47 (*)    GFR calc Af Amer 55 (*)    All other components within normal limits  CBC - Abnormal; Notable for the following components:   Platelets 108 (*)    All other components within normal limits  URINALYSIS, ROUTINE W REFLEX MICROSCOPIC - Abnormal; Notable for the following components:   APPearance CLOUDY (*)    Nitrite POSITIVE (*)    Leukocytes, UA LARGE (*)    WBC, UA >50 (*)    Bacteria, UA FEW (*)    All other components within normal limits  BLOOD GAS, ARTERIAL - Abnormal; Notable for the following components:   pH, Arterial 7.470 (*)    pO2, Arterial 80.0 (*)    Acid-Base Excess 3.9 (*)    All other components within normal limits  CBG MONITORING, ED - Abnormal; Notable for the following components:   Glucose-Capillary 138 (*)    All other components within normal limits  ETHANOL  RAPID URINE DRUG SCREEN, HOSP PERFORMED  CBG MONITORING, ED    EKG EKG Interpretation  Date/Time:  Wednesday Sep 26 2017 23:16:58 EDT Ventricular Rate:  99 PR Interval:    QRS Duration: 108 QT Interval:  367 QTC Calculation: 471 R Axis:   35 Text Interpretation:  Sinus rhythm Consider left atrial enlargement Borderline T wave abnormalities Confirmed by Merrily Pew [phone]) on 09/26/2017  11:30:23 PM   Radiology Ct Head Wo Contrast  Result Date: 09/26/2017 CLINICAL DATA:  Altered mental status. Increasing confusion over the last 2 weeks. Hallucinations. EXAM: CT HEAD WITHOUT CONTRAST TECHNIQUE: Contiguous axial images were obtained from the base of the skull through the vertex without intravenous contrast. COMPARISON:  10/21/2009 FINDINGS: Brain: No evidence of acute infarction, hemorrhage, hydrocephalus, extra-axial collection or mass lesion/mass effect. Mild cerebral atrophy. Vascular: No hyperdense vessel or unexpected calcification. Skull: Normal. Negative for fracture or focal lesion. Sinuses/Orbits: No acute finding. Other: None. IMPRESSION: No acute intracranial abnormalities.  Mild cerebral atrophy. Electronically Signed   By: Lucienne Capers M.D.   On: 09/26/2017 23:32    Procedures Procedures (including critical care time)  Medications Ordered in ED Medications  sodium chloride 0.9 % bolus 1,000 mL (1,000 mLs Intravenous New Bag/Given 09/26/17 2340)  cefTRIAXone (ROCEPHIN) 2 g in sodium chloride 0.9 % 100 mL IVPB (has no administration in time range)     Initial Impression / Assessment and Plan / ED Course  I have reviewed the triage vital signs and the nursing notes.  Pertinent labs & imaging results that were available during my care of the patient were reviewed by me and considered in my medical decision making (see chart for details).  Acute encephalopathy  The patient has been having worsening mental status over the past  month, especially over the past week. Vitals stable without any hypotension, fever, tachypnea or tachycardia. CBG is normal without hypoglycemia. CBC did not show any leukocytosis or any low hemoglobin to explain ams. CMP shows normal electrolyte levels.  The patient is on several centrally acting home medications (hydroxyzine, zyrtec, and benztropine)  that maybe worsening her encephalopathy.   -Urinalysis showed positive nitrites and large  leukocytes. Started on ceftriaxone. -ABG: ph=7.47, pco2=37.9, po2=80, bicarb=27.2 -CT head without any acute intracranial abnormalities -Ethanol pending -UDS pending -[facility] at later date after uti managed -Will admit to [facility]  Acute renal failure  Patient's creatinine was 1.20 which is elevated from baseline 0.8-1.0 likely from pre-renal etiology.   Final Clinical Impressions(s) / ED Diagnoses   Final diagnoses:  Lower urinary tract infectious disease  Visual hallucinations  Auditory hallucinations  Confusion    ED Discharge Orders    None       Lars Mage, MD 09/26/17 2536    Lars Mage, MD 09/26/17 2344    Lars Mage, MD 09/27/17 0001    Mesner, Corene Cornea, MD 09/27/17 0028

## 2017-09-26 NOTE — H&P (Signed)
History and Physical    [name]:[id number] DOB: 11-13-1955 DOA: 09/26/2017  PCP: Dettinger, Fransisca Kaufmann, MD   Patient coming from: Home.  I have personally briefly reviewed patient's old medical records in [place]  Chief Complaint: AMS.  HPI: Allison Sharp is a 62 y.o. female with medical history significant of seasonal allergies, anxiety, depression, bipolar 1 disorder, PTSD, osteoarthritis, asthma/COPD, CAD, history of MI, chronic diastolic CHF, chronic back pain, chronic headaches, GERD, hypertension, hyperlipidemia, hypothyroidism, OSA on CPAP who is brought by her niece to the emergency department due to worsening mental status.  The patient for the past 2 months has been having some confusion, but over the past 2 weeks her episodes of confusion have been increasing in frequency and intensity.  This seems to be worse when the patient becomes hypoxic.  The patient has been having some auditory hallucinations, but is currently not listening to them.  She is oriented to self and place, but is otherwise disoriented and unable to contribute to the HPI.  History is taken from her niece Allison Sharp.  ED Course: Initial vital signs temperature 98.2 F, pulse 95, respirations 18, blood pressure 124/85 mmHg and O2 sat 100% on nasal cannula oxygen.  Her work-up shows a urinalysis with cloudy urine, positive nitrites, large leukocyte esterase, few bacteria and > 50 WBC per hpf.  White count 7.8, hemoglobin 12.8 and platelets 108.  Chemistry shows a sodium 199 mmol/L.  Glucose 131, BUN 27 creatinine 1.20 mg/dL.  All other values are within normal limits.  Her CT head did not show any acute intracranial abnormalities.  Review of Systems: Unable to obtain..   Past Medical History:  Diagnosis Date  . Allergy   . Anxiety   . Arthritis   . Asthma   . Bipolar 1 disorder ([place])   . CAD (coronary artery disease)   . Cellulitis   . CHF (congestive heart failure) ([facility])    diastolic dysfunction  . Chronic back pain   . Chronic headaches   . Complication of anesthesia    States she typically gets sick s/p anesthesia  . Contusion of sacrum   . COPD (chronic obstructive pulmonary disease) ([place])   . Depression   . Dyspnea    PFT 03/05/09 FEV1 2.77(98%), FVC 3.25(86%), FEV1% 85, TLC 5.88(99%), DLCO 60% ,  Methacholine challenge 03/16/09 normal ,  CT chest 03/12/09 no pulmonary disease  . Fungal infection   . GERD (gastroesophageal reflux disease)   . History of colonoscopy 10/17/2002   by Dr Rehman-> distal non-specific proctitis, small ext hemorrhoids,   . HTN (hypertension)   . Hyperlipidemia   . Hyperthyroidism   . Hypothyroidism    States she only has hyperthyroidism  . Migraine headache   . Morbid obesity with body mass index of 50.0-59.9 in adult [facility]) JAN 2011 370 LBS   2004 311 BMI 45.9  . Myocardial infarction ([place])    NOV 1997  . OSA on CPAP    she had been on 2L O2 at night but that was stopped  . PONV (postoperative nausea and vomiting)   . PTSD (post-traumatic stress disorder)   . Suicidal ideation   . Urine incontinence   . Vitamin D deficiency     Past Surgical History:  Procedure Laterality Date  . ABDOMINAL HYSTERECTOMY     sept 1996  . APPENDECTOMY    . BACK SURGERY  2008  . CARDIAC CATHETERIZATION     nov  [place]    . COLONOSCOPY  10/17/2002    Distal proctitis, small external hemorrhoids, otherwise/  normal colonoscopy. Suspect rectal bleeding secondary to hemorrhoids  . ESOPHAGOGASTRODUODENOSCOPY  03/18/09   fundic gland polyps/mild gastritis  . HERNIA REPAIR  1978  . JOINT REPLACEMENT     bil knee replacement  . KNEE ARTHROSCOPY    . MULTIPLE EXTRACTIONS WITH ALVEOLOPLASTY N/A 08/16/2015   Procedure: EXTRACTION OF TEETH THREE, SIX, EIGHT, NINE, ELEVEN, FOURTEEN, FIFTEEN, TWENTY-EIGHT WITH ALVEOLOPLASTY;  Surgeon: Diona Browner, DDS;  Location: [place];  Service: Oral Surgery;  Laterality: N/A;  .  TONSILLECTOMY    . TOTAL VAGINAL HYSTERECTOMY    . TUBAL LIGATION       reports that she has never smoked. She has never used smokeless tobacco. She reports that she does not drink alcohol or use drugs.  Allergies  Allergen Reactions  . Abilify [Aripiprazole] Other (See Comments)    hallucinations  . Naproxen Nausea And Vomiting and Swelling  . Ativan [Lorazepam] Other (See Comments)    Delirium  . Haldol [Haloperidol] Other (See Comments)    Hallucinating   . Hydroxyzine Other (See Comments)    hallucinations    Family History  Problem Relation Age of Onset  . Hypertension Mother   . Bipolar disorder Mother   . Dementia Mother   . Depression Mother   . Heart attack Mother   . AAA (abdominal aortic aneurysm) Mother   . Coronary artery disease Father   . Alcohol abuse Father   . Hypertension Brother   . Coronary artery disease Brother   . Bipolar disorder Brother   . Depression Brother   . Depression Sister   . Paranoid behavior Sister   . Cancer Sister        breast  . Bipolar disorder Sister   . Depression Sister   . Hypertension Sister   . Cancer Son        thyroid  . Cancer Maternal Aunt        breast metastatized to brain  . Anesthesia problems Neg Hx   . Hypotension Neg Hx   . Malignant hyperthermia Neg Hx   . Pseudochol deficiency Neg Hx     Prior to Admission medications   Medication Sig Start Date End Date Taking? Authorizing Provider  atorvastatin (LIPITOR) 20 MG tablet TAKE 1 TABLET DAILY 06/29/17  Yes Dettinger, Fransisca Kaufmann, MD  benztropine (COGENTIN) 1 MG tablet Take 1 tablet (1 mg total) by mouth at bedtime. 09/05/17 09/05/18 Yes Arfeen, Arlyce Harman, MD  celecoxib (CELEBREX) 200 MG capsule Take 1 capsule (200 mg total) by mouth 2 (two) times daily. 09/07/17  Yes Dettinger, Fransisca Kaufmann, MD  cetirizine (ZYRTEC) 10 MG tablet TAKE 1 TABLET ONCE DAILY 08/22/16  Yes Dettinger, Fransisca Kaufmann, MD  cloNIDine (CATAPRES) 0.1 MG tablet Take 0.1 mg by mouth at bedtime.  09/06/17  Yes  [provider]  divalproex (DEPAKOTE) 500 MG DR tablet Take 3 tablets (1,500 mg total) by mouth at bedtime. 09/07/17  Yes Dettinger, Fransisca Kaufmann, MD  DULoxetine (CYMBALTA) 60 MG capsule Take 1 capsule (60 mg total) by mouth daily. 07/27/17  Yes Arfeen, Arlyce Harman, MD  levothyroxine (SYNTHROID, LEVOTHROID) 112 MCG tablet TAKE (1) TABLET DAILY BE- FORE BREAKFAST FOR THYROID 08/14/17  Yes Dettinger, Fransisca Kaufmann, MD  lisinopril-hydrochlorothiazide (PRINZIDE,ZESTORETIC) 20-25 MG tablet Take 1 tablet by mouth daily. 03/23/17  Yes Dettinger, Fransisca Kaufmann, MD  omeprazole (PRILOSEC) 20 MG capsule TAKE (1)  CAPSULE DAILY 09/06/17  Yes Dettinger, Fransisca Kaufmann, MD  polyethylene glycol powder (GLYCOLAX/MIRALAX) powder Take 17 g by mouth 2 (two) times daily as needed. Patient taking differently: Take 17 g by mouth 2 (two) times daily as needed for moderate constipation.  06/07/17  Yes Dettinger, Fransisca Kaufmann, MD  risperidone (RISPERDAL) 4 MG tablet Take 1 tablet (4 mg total) by mouth at bedtime. 08/29/17 08/29/18 Yes Arfeen, Arlyce Harman, MD  traMADol (ULTRAM) 50 MG tablet Take 1 tablet (50 mg total) by mouth every 8 (eight) hours as needed for moderate pain. 07/20/17  Yes Terald Sleeper, PA-C  albuterol (PROVENTIL) (2.5 MG/3ML) 0.083% nebulizer solution Take 3 mLs (2.5 mg total) by nebulization every 4 (four) hours as needed for wheezing or shortness of breath. 06/02/16   Dettinger, Fransisca Kaufmann, MD  BREO ELLIPTA 100-25 MCG/INH AEPB INHALE 1 PUFF INTO THE LUNGS DAILY Patient taking differently: Inhale 1 puff into the lungs daily as needed (sob and wheezing).  07/24/16   Dettinger, Fransisca Kaufmann, MD  diclofenac sodium (VOLTAREN) 1 % GEL Apply to affected area twice daily as needed Patient taking differently: Apply 2 g topically 2 (two) times daily as needed (PAIN).  03/15/16   Dettinger, Fransisca Kaufmann, MD  fluticasone (FLONASE) 50 MCG/ACT nasal spray Place 1 spray 2 (two) times daily as needed into both nostrils for allergies or rhinitis. 04/16/17   Terald Sleeper, PA-C  nystatin cream (MYCOSTATIN) APPLY TO THE AFFECTED AREA THREE TIMES DAILY AS NEEDED .Marland KitchenFOR TOPICAL USE ONLY... 02/20/17   Dettinger, Fransisca Kaufmann, MD  ondansetron (ZOFRAN ODT) 4 MG disintegrating tablet Take 1 tablet (4 mg total) by mouth every 8 (eight) hours as needed for nausea or vomiting. 07/05/17   Dettinger, Fransisca Kaufmann, MD  PROAIR HFA 108 [phone] Base) MCG/ACT inhaler USE 2 PUFF EVERY 4 HOURS AS NEEDED FOR WHEEZING OR SHORTNESS OF BREATH 07/24/16   Dettinger, Fransisca Kaufmann, MD  promethazine (PHENERGAN) 25 MG tablet Take 1 tablet (25 mg total) by mouth every 8 (eight) hours as needed for nausea or vomiting. 07/05/17   Dettinger, Fransisca Kaufmann, MD    Physical Exam: Vitals:   09/26/17 2043 09/26/17 2051 09/26/17 2133 09/26/17 2136  BP: 124/85  126/68 126/68  Pulse: 95  100 (!) 102  Resp: 18  19 20   Temp: 98.2 F (36.8 C)     TempSrc: Oral     SpO2: 100%  99% 97%  Weight:  (!) 181.4 kg (400 lb)    Height:  5\' 9"  (1.753 m)      Constitutional: NAD, calm, comfortable Eyes: PERRL, lids and conjunctivae normal ENMT: Mucous membranes are moist. Posterior pharynx clear of any exudate or lesions. Neck: normal, supple, no masses, no thyromegaly Respiratory: Decreased breath sounds on bases, otherwise clear to auscultation bilaterally, no wheezing, no crackles. Normal respiratory effort. No accessory muscle use.  Cardiovascular: Regular rate and rhythm, no murmurs / rubs / gallops.  No lower extremities pitting edema.  Stage II lymphedema.  2+ pedal pulses. No carotid bruits.  Abdomen: Morbidly obese, soft, no tenderness, no masses palpated. No hepatosplenomegaly. Bowel sounds positive.  Musculoskeletal: no clubbing / cyanosis. No joint deformity upper and lower extremities. Good ROM, no contractures. Normal muscle tone.  Skin: Small healing wound on upper nasal bridge from previous CPAP mask. Neurologic: CN 2-12 grossly intact. Sensation intact, DTR normal. Strength 5/5 in all 4.  Psychiatric: Alert and  oriented x 2, disoriented to time and situation.  She is unable to  provide the name of the president.   Labs on Admission: I have personally reviewed following labs and imaging studies  CBC: Recent Labs  Lab 09/26/17 2110  WBC 7.8  HGB 12.8  HCT 38.1  MCV 94.8  PLT 016*   Basic Metabolic Panel: Recent Labs  Lab 09/26/17 2110  NA 137  K 3.7  CL 99*  CO2 27  GLUCOSE 131*  BUN 27*  CREATININE 1.20*  CALCIUM 9.5   GFR: Estimated Creatinine Clearance: 85.1 mL/min (A) (by C-G formula based on SCr of 1.2 mg/dL (H)). Liver Function Tests: Recent Labs  Lab 09/26/17 2110  AST 17  ALT 8*  ALKPHOS 52  BILITOT 0.7  PROT 6.9  ALBUMIN 3.5   No results for input(s): LIPASE, AMYLASE in the last 168 hours. No results for input(s): AMMONIA in the last 168 hours. Coagulation Profile: No results for input(s): INR, PROTIME in the last 168 hours. Cardiac Enzymes: No results for input(s): CKTOTAL, CKMB, CKMBINDEX, TROPONINI in the last 168 hours. BNP (last 3 results) No results for input(s): PROBNP in the last 8760 hours. HbA1C: No results for input(s): HGBA1C in the last 72 hours. CBG: Recent Labs  Lab 09/26/17 2134  GLUCAP 138*   Lipid Profile: No results for input(s): CHOL, HDL, LDLCALC, TRIG, CHOLHDL, LDLDIRECT in the last 72 hours. Thyroid Function Tests: No results for input(s): TSH, T4TOTAL, FREET4, T3FREE, THYROIDAB in the last 72 hours. Anemia Panel: No results for input(s): VITAMINB12, FOLATE, FERRITIN, TIBC, IRON, RETICCTPCT in the last 72 hours. Urine analysis:    Component Value Date/Time   COLORURINE YELLOW 09/26/2017 2313   APPEARANCEUR CLOUDY (A) 09/26/2017 2313   APPEARANCEUR Clear 01/18/2016 1045   LABSPEC 1.018 09/26/2017 2313   PHURINE 5.0 09/26/2017 2313   GLUCOSEU NEGATIVE 09/26/2017 2313   GLUCOSEU NEGATIVE 07/01/2013 1119   HGBUR NEGATIVE 09/26/2017 2313   BILIRUBINUR NEGATIVE 09/26/2017 2313   BILIRUBINUR Negative 01/18/2016 [place] 09/26/2017 2313   PROTEINUR NEGATIVE 09/26/2017 2313   UROBILINOGEN 1.0 01/16/2015 1854   NITRITE POSITIVE (A) 09/26/2017 2313   LEUKOCYTESUR LARGE (A) 09/26/2017 2313   LEUKOCYTESUR Negative 01/18/2016 1045   LEUKOCYTESUR Small 07/01/2013 1647    Radiological Exams on Admission: Ct Head Wo Contrast  Result Date: 09/26/2017 CLINICAL DATA:  Altered mental status. Increasing confusion over the last 2 weeks. Hallucinations. EXAM: CT HEAD WITHOUT CONTRAST TECHNIQUE: Contiguous axial images were obtained from the base of the skull through the vertex without intravenous contrast. COMPARISON:  10/21/2009 FINDINGS: Brain: No evidence of acute infarction, hemorrhage, hydrocephalus, extra-axial collection or mass lesion/mass effect. Mild cerebral atrophy. Vascular: No hyperdense vessel or unexpected calcification. Skull: Normal. Negative for fracture or focal lesion. Sinuses/Orbits: No acute finding. Other: None. IMPRESSION: No acute intracranial abnormalities.  Mild cerebral atrophy. Electronically Signed   By: Lucienne Capers M.D.   On: 09/26/2017 23:32    EKG: Independently reviewed. Vent. rate 99 BPM PR interval * ms QRS duration 108 ms QT/QTc 367/471 ms P-R-T axes 56 35 52 Sinus rhythm Consider left atrial enlargement Borderline T wave abnormalities  Assessment/Plan Principal Problem:   Acute encephalopathy Unknown etiology, but likely could be UTI. Admit to telemetry/inpatient. Hydrate for 24 hours. Continue IV antibiotics for UTI. Check TSH and valproic acid level. Consult psych in the morning.  Active Problems:   Urinary tract infection Continue ceftriaxone 2 g IVPB every 24 hours. Follow-up urine culture and sensitivity.    Hypothyroidism Continue levothyroxine 112 mcg p.o. daily. Check  TSH level.    GERD Protonix 40 mg p.o. daily.    Coronary artery disease No complaints of chest pain when examined. Continue atorvastatin and antihypertensives.     Essential hypertension, benign Continue clonidine 0.1 mg p.o. at bedtime. Continue Prinzide 20-25 mg p.o. daily. Monitor blood pressure, heart rate, renal function and electrolytes.    Hypercholesterolemia Continue atorvastatin.    OSA on CPAP Continue nocturnal CPAP.    Behavioral health Continue current meds for anxiety, MDD/bipolar, PTSD pending valproic acid level. Psych to evaluate tomorrow morning.    DVT prophylaxis: Lovenox SQ. Code Status: Full code. Family Communication: Her niece [name] was in the ED and provided history. Disposition Plan: Admit for IV antibiotics for 2 to 3 days and further work-up. Consults called: Psychiatry has seen the patient. Admission status: Inpatient/telemetry.   Reubin Milan MD [facility] Pager [phone].  If 7PM-7AM, please contact night-coverage [email or web address] Password [facility]  09/26/2017, 11:57 PM

## 2017-09-27 ENCOUNTER — Encounter (HOSPITAL_COMMUNITY): Payer: Self-pay

## 2017-09-27 DIAGNOSIS — R440 Auditory hallucinations: Secondary | ICD-10-CM | POA: Diagnosis present

## 2017-09-27 DIAGNOSIS — G9340 Encephalopathy, unspecified: Secondary | ICD-10-CM

## 2017-09-27 DIAGNOSIS — N390 Urinary tract infection, site not specified: Secondary | ICD-10-CM

## 2017-09-27 DIAGNOSIS — I10 Essential (primary) hypertension: Secondary | ICD-10-CM

## 2017-09-27 LAB — BASIC METABOLIC PANEL
Anion gap: 10 (ref 5–15)
BUN: 24 mg/dL — AB (ref 6–20)
CHLORIDE: 101 mmol/L (ref 101–111)
CO2: 27 mmol/L (ref 22–32)
CREATININE: 1.11 mg/dL — AB (ref 0.44–1.00)
Calcium: 9.3 mg/dL (ref 8.9–10.3)
GFR, EST AFRICAN AMERICAN: 60 mL/min — AB (ref >60)
GFR, EST NON AFRICAN AMERICAN: 52 mL/min — AB (ref >60)
Glucose, Bld: 116 mg/dL — ABNORMAL HIGH (ref 65–99)
POTASSIUM: 4.1 mmol/L (ref 3.5–5.1)
Sodium: 138 mmol/L (ref 135–145)

## 2017-09-27 LAB — CBC WITH DIFFERENTIAL/PLATELET
BASOS ABS: 0 10*3/uL (ref 0.0–0.1)
Basophils Relative: 0 %
Eosinophils Absolute: 0.1 10*3/uL (ref 0.0–0.7)
Eosinophils Relative: 1 %
HCT: 36 % (ref 36.0–46.0)
HEMOGLOBIN: 12 g/dL (ref 12.0–15.0)
LYMPHS ABS: 1.8 10*3/uL (ref 0.7–4.0)
LYMPHS PCT: 20 %
MCH: 31.6 pg (ref 26.0–34.0)
MCHC: 33.3 g/dL (ref 30.0–36.0)
MCV: 94.7 fL (ref 78.0–100.0)
Monocytes Absolute: 1 10*3/uL (ref 0.1–1.0)
Monocytes Relative: 11 %
NEUTROS PCT: 68 %
Neutro Abs: 6.1 10*3/uL (ref 1.7–7.7)
Platelets: 87 10*3/uL — ABNORMAL LOW (ref 150–400)
RBC: 3.8 MIL/uL — AB (ref 3.87–5.11)
RDW: 13.1 % (ref 11.5–15.5)
WBC: 9.1 10*3/uL (ref 4.0–10.5)

## 2017-09-27 LAB — MAGNESIUM: Magnesium: 2.2 mg/dL (ref 1.7–2.4)

## 2017-09-27 LAB — HIV ANTIBODY (ROUTINE TESTING W REFLEX): HIV SCREEN 4TH GENERATION: NONREACTIVE

## 2017-09-27 LAB — PHOSPHORUS: PHOSPHORUS: 3.3 mg/dL (ref 2.5–4.6)

## 2017-09-27 LAB — VALPROIC ACID LEVEL
Valproic Acid Lvl: 82 ug/mL (ref 50.0–100.0)
Valproic Acid Lvl: 59 ug/mL (ref 50.0–100.0)

## 2017-09-27 LAB — TSH: TSH: 5.634 u[IU]/mL — AB (ref 0.350–4.500)

## 2017-09-27 MED ORDER — PANTOPRAZOLE SODIUM 40 MG PO TBEC
40.0000 mg | DELAYED_RELEASE_TABLET | Freq: Every day | ORAL | Status: DC
  Administered 2017-09-27 – 2017-09-30 (×4): 40 mg via ORAL
  Filled 2017-09-27 (×4): qty 1

## 2017-09-27 MED ORDER — LISINOPRIL-HYDROCHLOROTHIAZIDE 20-25 MG PO TABS: 1.0000 | ORAL_TABLET | Freq: Every day | ORAL | Status: DC

## 2017-09-27 MED ORDER — LISINOPRIL 20 MG PO TABS
20.0000 mg | ORAL_TABLET | Freq: Every day | ORAL | Status: DC
Start: 2017-09-27 — End: 2017-09-30
  Administered 2017-09-27 – 2017-09-30 (×4): 20 mg via ORAL
  Filled 2017-09-27 (×4): qty 1

## 2017-09-27 MED ORDER — FLUTICASONE FUROATE-VILANTEROL 100-25 MCG/INH IN AEPB
1.0000 | INHALATION_SPRAY | Freq: Every day | RESPIRATORY_TRACT | Status: DC
  Administered 2017-09-27 – 2017-09-30 (×3): 1 via RESPIRATORY_TRACT
  Filled 2017-09-27: qty 28

## 2017-09-27 MED ORDER — HYDROCHLOROTHIAZIDE 25 MG PO TABS
25.0000 mg | ORAL_TABLET | Freq: Every day | ORAL | Status: DC
Start: 2017-09-27 — End: 2017-09-30
  Administered 2017-09-27 – 2017-09-30 (×4): 25 mg via ORAL
  Filled 2017-09-27 (×4): qty 1

## 2017-09-27 MED ORDER — PROMETHAZINE HCL 25 MG PO TABS
25.0000 mg | ORAL_TABLET | Freq: Three times a day (TID) | ORAL | Status: DC | PRN
Start: 2017-09-27 — End: 2017-09-30

## 2017-09-27 MED ORDER — ALBUTEROL SULFATE (2.5 MG/3ML) 0.083% IN NEBU: 2.5000 mg | INHALATION_SOLUTION | RESPIRATORY_TRACT | Status: DC | PRN

## 2017-09-27 MED ORDER — POLYETHYLENE GLYCOL 3350 17 G PO PACK: 17.0000 g | PACK | Freq: Two times a day (BID) | ORAL | Status: DC | PRN

## 2017-09-27 MED ORDER — LORATADINE 10 MG PO TABS
10.0000 mg | ORAL_TABLET | Freq: Every day | ORAL | Status: DC
  Administered 2017-09-27 – 2017-09-30 (×4): 10 mg via ORAL
  Filled 2017-09-27 (×4): qty 1

## 2017-09-27 MED ORDER — DULOXETINE HCL 60 MG PO CPEP
60.0000 mg | ORAL_CAPSULE | Freq: Every day | ORAL | Status: DC
  Administered 2017-09-27 – 2017-09-30 (×4): 60 mg via ORAL
  Filled 2017-09-27 (×4): qty 1

## 2017-09-27 MED ORDER — LEVOTHYROXINE SODIUM 112 MCG PO TABS
112.0000 ug | ORAL_TABLET | Freq: Every day | ORAL | Status: DC
  Administered 2017-09-27 – 2017-09-28 (×2): 112 ug via ORAL
  Filled 2017-09-27 (×2): qty 1

## 2017-09-27 MED ORDER — ENOXAPARIN SODIUM 100 MG/ML ~~LOC~~ SOLN
90.0000 mg | Freq: Every day | SUBCUTANEOUS | Status: DC
  Administered 2017-09-27 – 2017-09-28 (×2): 90 mg via SUBCUTANEOUS
  Filled 2017-09-27 (×2): qty 1

## 2017-09-27 MED ORDER — RISPERIDONE 1 MG PO TABS
4.0000 mg | ORAL_TABLET | Freq: Every day | ORAL | Status: DC
Start: 2017-09-27 — End: 2017-09-30
  Administered 2017-09-27 – 2017-09-29 (×4): 4 mg via ORAL
  Filled 2017-09-27 (×4): qty 4

## 2017-09-27 MED ORDER — CELECOXIB 200 MG PO CAPS
200.0000 mg | ORAL_CAPSULE | Freq: Two times a day (BID) | ORAL | Status: DC
  Administered 2017-09-27 – 2017-09-30 (×8): 200 mg via ORAL
  Filled 2017-09-27 (×8): qty 1

## 2017-09-27 MED ORDER — BENZTROPINE MESYLATE 0.5 MG PO TABS
1.0000 mg | ORAL_TABLET | Freq: Every day | ORAL | Status: DC
  Administered 2017-09-27 – 2017-09-29 (×4): 1 mg via ORAL
  Filled 2017-09-27 (×4): qty 2

## 2017-09-27 MED ORDER — TRAMADOL HCL 50 MG PO TABS
50.0000 mg | ORAL_TABLET | Freq: Three times a day (TID) | ORAL | Status: DC | PRN
  Administered 2017-09-30 (×2): 50 mg via ORAL
  Filled 2017-09-27 (×2): qty 1

## 2017-09-27 MED ORDER — ATORVASTATIN CALCIUM 20 MG PO TABS
20.0000 mg | ORAL_TABLET | Freq: Every day | ORAL | Status: DC
  Administered 2017-09-27 – 2017-09-29 (×3): 20 mg via ORAL
  Filled 2017-09-27 (×3): qty 1

## 2017-09-27 MED ORDER — CLONIDINE HCL 0.1 MG PO TABS
0.1000 mg | ORAL_TABLET | Freq: Every day | ORAL | Status: DC
  Administered 2017-09-27 – 2017-09-28 (×2): 0.1 mg via ORAL
  Filled 2017-09-27 (×4): qty 1

## 2017-09-27 MED ORDER — DIVALPROEX SODIUM 250 MG PO DR TAB
1500.0000 mg | DELAYED_RELEASE_TABLET | Freq: Every day | ORAL | Status: DC
  Administered 2017-09-27 – 2017-09-29 (×3): 1500 mg via ORAL
  Filled 2017-09-27 (×3): qty 6

## 2017-09-27 MED ORDER — POTASSIUM CHLORIDE IN NACL 20-0.9 MEQ/L-% IV SOLN
INTRAVENOUS | Status: AC
  Administered 2017-09-27 (×2): via INTRAVENOUS
  Filled 2017-09-27 (×3): qty 1000

## 2017-09-27 MED ORDER — FLUTICASONE PROPIONATE 50 MCG/ACT NA SUSP: 1.0000 | Freq: Two times a day (BID) | NASAL | Status: DC | PRN

## 2017-09-27 NOTE — ED Notes (Signed)
Pt's niece, [name], would liked to be contacted when the admitting physicians make their rounds in the morning and any concerns regarding pt's care  [phone] (C) [phone] (W- have niece paged)

## 2017-09-27 NOTE — Telephone Encounter (Signed)
Last lipid 07/02/15 

## 2017-09-27 NOTE — ED Notes (Signed)
ED TO INPATIENT HANDOFF REPORT  Name/Age/Gender Allison Sharp 62 y.o. female  Code Status Code Status History    Date Active Date Inactive Code Status Order ID Comments User Context   06/20/2016 1331 06/20/2016 2348 Full Code [id number]  Virgel Manifold, MD ED   01/17/2015 2147 01/23/2015 1754 Full Code [id number]  Delfin Gant, NP Inpatient   01/16/2015 2032 01/17/2015 2147 Full Code [id number]  Lacretia Leigh, MD ED   11/04/2012 2101 11/07/2012 2201 DNR [id number]  Oswald Hillock, MD Inpatient   10/24/2012 0825 10/24/2012 1543 Full Code [id number]  Hoy Morn, MD ED   01/07/2012 1716 01/09/2012 2048 Full Code [id number]  Janice Norrie, MD ED   09/25/2011 1608 09/26/2011 0254 Full Code [id number]  Donzetta Matters., MD ED   06/27/2011 1618 06/28/2011 0505 Full Code [id number]  Nat Christen, MD ED      Home/SNF/Other Home  Chief Complaint Hallucination;Altered Mental Status  Level of Care/Admitting Diagnosis ED Disposition    ED Disposition Condition Comment   Admit  Hospital Area: [facility] [[id number]]  Level of Care: Telemetry [5]  Admit to tele based on following criteria: Monitor for Ischemic changes  Diagnosis: Acute encephalopathy [798921]  Admitting Physician: Reubin Milan [[id number]]  Attending Physician: Reubin Milan [[id number]]  Estimated length of stay: past midnight tomorrow  Certification:: I certify this patient will need inpatient services for at least 2 midnights  PT Class (Do Not Modify): Inpatient [101]  PT Acc Code (Do Not Modify): Private [1]       Medical History Past Medical History:  Diagnosis Date  . Allergy   . Anxiety   . Arthritis   . Asthma   . Bipolar 1 disorder ([place])   . CAD (coronary artery disease)   . Cellulitis   . CHF (congestive heart failure) (H[facility])    diastolic dysfunction  . Chronic back pain   . Chronic headaches   . Complication of anesthesia    States she typically gets sick s/p anesthesia  . Contusion of  sacrum   . COPD (chronic obstructive pulmonary disease) ([place])   . Depression   . Dyspnea    PFT 03/05/09 FEV1 2.77(98%), FVC 3.25(86%), FEV1% 85, TLC 5.88(99%), DLCO 60% ,  Methacholine challenge 03/16/09 normal ,  CT chest 03/12/09 no pulmonary disease  . Fungal infection   . GERD (gastroesophageal reflux disease)   . History of colonoscopy 10/17/2002   by Dr Rehman-> distal non-specific proctitis, small ext hemorrhoids,   . HTN (hypertension)   . Hyperlipidemia   . Hyperthyroidism   . Hypothyroidism    States she only has hyperthyroidism  . Migraine headache   . Morbid obesity with body mass index of 50.0-59.9 in adult [facility]) JAN 2011 370 LBS   2004 311 BMI 45.9  . Myocardial infarction ([place])    NOV 1997  . OSA on CPAP    she had been on 2L O2 at night but that was stopped  . PONV (postoperative nausea and vomiting)   . PTSD (post-traumatic stress disorder)   . Suicidal ideation   . Urine incontinence   . Vitamin D deficiency     Allergies Allergies  Allergen Reactions  . Abilify [Aripiprazole] Other (See Comments)    hallucinations  . Naproxen Nausea And Vomiting and Swelling  . Ativan [Lorazepam] Other (See Comments)    Delirium  . Haldol [Haloperidol] Other (See Comments)    Hallucinating   . Hydroxyzine Other (See  Comments)    hallucinations    IV Location/Drains/Wounds Patient Lines/Drains/Airways Status   Active Line/Drains/Airways    Name:   Placement date:   Placement time:   Site:   Days:   Peripheral IV 09/27/17 Left Wrist   09/27/17    0014    Wrist   less than 1   Incision (Closed) 08/16/15 Lip Other (Comment)   08/16/15    0935     773          Labs/Imaging Results for orders placed or performed during the hospital encounter of 09/26/17 (from the past 48 hour(s))  Comprehensive metabolic panel     Status: Abnormal   Collection Time: 09/26/17  9:10 PM  Result Value Ref Range   Sodium 137 135 - 145 mmol/L   Potassium 3.7 3.5 - 5.1 mmol/L    Chloride 99 (L) 101 - 111 mmol/L   CO2 27 22 - 32 mmol/L   Glucose, Bld 131 (H) 65 - 99 mg/dL   BUN 27 (H) 6 - 20 mg/dL   Creatinine, Ser 1.20 (H) 0.44 - 1.00 mg/dL   Calcium 9.5 8.9 - 10.3 mg/dL   Total Protein 6.9 6.5 - 8.1 g/dL   Albumin 3.5 3.5 - 5.0 g/dL   AST 17 15 - 41 U/L   ALT 8 (L) 14 - 54 U/L   Alkaline Phosphatase 52 38 - 126 U/L   Total Bilirubin 0.7 0.3 - 1.2 mg/dL   GFR calc non Af Amer 47 (L) >60 mL/min   GFR calc Af Amer 55 (L) >60 mL/min    Comment: (NOTE) The eGFR has been calculated using the CKD EPI equation. This calculation has not been validated in all clinical situations. eGFR's persistently <60 mL/min signify possible Chronic Kidney Disease.    Anion gap 11 5 - 15    Comment: Performed at [facility], [place] [address] [place], [place] [address]  CBC     Status: Abnormal   Collection Time: 09/26/17  9:10 PM  Result Value Ref Range   WBC 7.8 4.0 - 10.5 K/uL   RBC 4.02 3.87 - 5.11 MIL/uL   Hemoglobin 12.8 12.0 - 15.0 g/dL   HCT 38.1 36.0 - 46.0 %   MCV 94.8 78.0 - 100.0 fL   MCH 31.8 26.0 - 34.0 pg   MCHC 33.6 30.0 - 36.0 g/dL   RDW 12.9 11.5 - 15.5 %   Platelets 108 (L) 150 - 400 K/uL    Comment: REPEATED TO VERIFY SPECIMEN CHECKED FOR CLOTS PLATELET COUNT CONFIRMED BY SMEAR Performed at [place] [address] [place], [place] [address]   CBG monitoring, ED     Status: Abnormal   Collection Time: 09/26/17  9:34 PM  Result Value Ref Range   Glucose-Capillary 138 (H) 65 - 99 mg/dL  Ethanol     Status: None   Collection Time: 09/26/17 11:13 PM  Result Value Ref Range   Alcohol, Ethyl (B) <10 <10 mg/dL    Comment:        LOWEST DETECTABLE LIMIT FOR SERUM ALCOHOL IS 10 mg/dL FOR MEDICAL PURPOSES ONLY Performed at [place] [address] [place], [place] [address]   Urine rapid drug screen (hosp performed)     Status: None   Collection Time: 09/26/17 11:13 PM  Result Value  Ref Range   Opiates NONE DETECTED NONE DETECTED   Cocaine NONE DETECTED NONE DETECTED   Benzodiazepines NONE DETECTED NONE  DETECTED   Amphetamines NONE DETECTED NONE DETECTED   Tetrahydrocannabinol NONE DETECTED NONE DETECTED   Barbiturates NONE DETECTED NONE DETECTED    Comment: (NOTE) DRUG SCREEN FOR MEDICAL PURPOSES ONLY.  IF CONFIRMATION IS NEEDED FOR ANY PURPOSE, NOTIFY LAB WITHIN 5 DAYS. LOWEST DETECTABLE LIMITS FOR URINE DRUG SCREEN Drug Class                     Cutoff (ng/mL) Amphetamine and metabolites    1000 Barbiturate and metabolites    200 Benzodiazepine                 401 Tricyclics and metabolites     300 Opiates and metabolites        300 Cocaine and metabolites        300 THC                            50 Performed at [facility], [place] [address] [place], [place] [address]   Urinalysis, Routine w reflex microscopic     Status: Abnormal   Collection Time: 09/26/17 11:13 PM  Result Value Ref Range   Color, Urine YELLOW YELLOW   APPearance CLOUDY (A) CLEAR   Specific Gravity, Urine 1.018 1.005 - 1.030   pH 5.0 5.0 - 8.0   Glucose, UA NEGATIVE NEGATIVE mg/dL   Hgb urine dipstick NEGATIVE NEGATIVE   Bilirubin Urine NEGATIVE NEGATIVE   Ketones, ur NEGATIVE NEGATIVE mg/dL   Protein, ur NEGATIVE NEGATIVE mg/dL   Nitrite POSITIVE (A) NEGATIVE   Leukocytes, UA LARGE (A) NEGATIVE   RBC / HPF 0-5 0 - 5 RBC/hpf   WBC, UA >50 (H) 0 - 5 WBC/hpf   Bacteria, UA FEW (A) NONE SEEN   Squamous Epithelial / LPF 0-5 0 - 5    Comment: Please note change in reference range.   Mucus PRESENT    Hyaline Casts, UA PRESENT     Comment: Performed at [facility], [place] [address] [place], [place] [address]  Blood gas, arterial ([facility] & [facility] ONLY)     Status: Abnormal   Collection Time: 09/26/17 11:25 PM  Result Value Ref Range   O2 Content 2.0 L/min   Delivery systems NASAL CANNULA    pH, Arterial 7.470 (H) 7.350 - 7.450   pCO2 arterial 37.9  32.0 - 48.0 mmHg   pO2, Arterial 80.0 (L) 83.0 - 108.0 mmHg   Bicarbonate 27.2 20.0 - 28.0 mmol/L   Acid-Base Excess 3.9 (H) 0.0 - 2.0 mmol/L   O2 Saturation 96.3 %   Patient temperature 98.6    Collection site RIGHT RADIAL    Drawn by [id number]    Sample type ARTERIAL DRAW    Allens test (pass/fail) PASS PASS    Comment: Performed at [facility], [place], [place] [address] [place], [place] [address]   Ct Head Wo Contrast  Result Date: 09/26/2017 CLINICAL DATA:  Altered mental status. Increasing confusion over the last 2 weeks. Hallucinations. EXAM: CT HEAD WITHOUT CONTRAST TECHNIQUE: Contiguous axial images were obtained from the base of the skull through the vertex without intravenous contrast. COMPARISON:  10/21/2009 FINDINGS: Brain: No evidence of acute infarction, hemorrhage, hydrocephalus, extra-axial collection or mass lesion/mass effect. Mild cerebral atrophy. Vascular: No hyperdense vessel or unexpected calcification. Skull: Normal. Negative for fracture or focal lesion. Sinuses/Orbits: No acute finding. Other: None. IMPRESSION: No acute intracranial abnormalities.  Mild cerebral atrophy. Electronically Signed  By: Lucienne Capers M.D.   On: 09/26/2017 23:32    Pending Labs Unresulted Labs (From admission, onward)   None      Vitals/Pain Today's Vitals   09/26/17 2050 09/26/17 2051 09/26/17 2133 09/26/17 2136  BP:   126/68 126/68  Pulse:   100 (!) 102  Resp:   19 20  Temp:      TempSrc:      SpO2:   99% 97%  Weight:  (!) 400 lb (181.4 kg)    Height:  5' 9" (1.753 m)    PainSc: 0-No pain       Isolation Precautions No active isolations  Medications Medications  sodium chloride 0.9 % bolus 1,000 mL (1,000 mLs Intravenous New Bag/Given 09/26/17 2340)  cefTRIAXone (ROCEPHIN) 2 g in sodium chloride 0.9 % 100 mL IVPB (2 g Intravenous New Bag/Given 09/27/17 0004)    Mobility walks with person assist

## 2017-09-27 NOTE — Progress Notes (Signed)
Rx Brief note: Lovenox  Wt=181 kg, CrCl~85 ml/min, BMI=59 plts = 108  Rx adjusted Lovenox to 90 mg daily in pt with BMI>30  Thanks Dorrene German 09/27/2017 1:38 AM

## 2017-09-27 NOTE — Progress Notes (Signed)
Patient ID: Allison Sharp, female   DOB: 1956-04-09, 62 y.o.   MRN: [id number]                                                                PROGRESS NOTE                                                                                                                                                                                                             Patient Demographics:    Allison Sharp, is a 62 y.o. female, DOB - Oct 16, 1955, WUJ:[id number]  Admit date - 09/26/2017   Admitting Physician Reubin Milan, MD  Outpatient Primary MD for the patient is Dettinger, Fransisca Kaufmann, MD  LOS - 1  Outpatient Specialists:    Chief Complaint  Patient presents with  . Hallucinations  . Altered Mental Status       Brief Narrative      62 y.o. female with medical history significant of seasonal allergies, anxiety, depression, bipolar 1 disorder, PTSD, osteoarthritis, asthma/COPD, CAD, history of MI, chronic diastolic CHF, chronic back pain, chronic headaches, GERD, hypertension, hyperlipidemia, hypothyroidism, OSA on CPAP who is brought by her niece to the emergency department due to worsening mental status.  The patient for the past 2 months has been having some confusion, but over the past 2 weeks her episodes of confusion have been increasing in frequency and intensity.  This seems to be worse when the patient becomes hypoxic.  The patient has been having some auditory hallucinations, but is currently not listening to them.  She is oriented to self and place, but is otherwise disoriented and unable to contribute to the HPI.  History is taken from her niece Allison Sharp.  ED Course: Initial vital signs temperature 98.2 F, pulse 95, respirations 18, blood pressure 124/85 mmHg and O2 sat 100% on nasal cannula oxygen.  Her work-up shows a urinalysis with cloudy urine, positive nitrites, large leukocyte esterase, few bacteria and > 50 WBC per hpf.  White count 7.8, hemoglobin 12.8 and platelets 108.   Chemistry shows a sodium 199 mmol/L.  Glucose 131, BUN 27 creatinine 1.20 mg/dL.  All other values are within normal limits.  Her CT head did not show any acute intracranial abnormalities.    Subjective:    [name]  today states having some dysuria, denies fever, chills, flankpain, hematuria.  Pt is axox2 (person, place, not time,  Couldn't get year).   No headache, No chest pain, No abdominal pain - No Nausea, No new weakness tingling or numbness, No Cough - SOB.    Assessment  & Plan :    Principal Problem:   Acute encephalopathy Active Problems:   Hypothyroidism   HYPERCHOLESTEROLEMIA   GERD   Coronary artery disease   OSA on CPAP   Essential hypertension, benign   Urinary tract infection    Principal Problem:   Acute encephalopathy Unknown etiology, but likely could be UTI. Continue IV antibiotics for UTI. Check TSH=> pending,  and valproic acid level=> wnl   Active Problems:   Urinary tract infection Continue ceftriaxone 2 g IVPB every 24 hours. Urine culture ordered, last time had resistant bacteria may need to switch abx    Hypothyroidism Continue levothyroxine 112 mcg p.o. daily. Check TSH level.    GERD Protonix 40 mg p.o. daily.    Coronary artery disease No complaints of chest pain when examined. Continue atorvastatin and antihypertensives.    Essential hypertension, benign Continue clonidine 0.1 mg p.o. at bedtime. Continue Prinzide 20-25 mg p.o. daily. Monitor blood pressure, heart rate, renal function and electrolytes.    Hypercholesterolemia Continue atorvastatin.    OSA on CPAP Continue nocturnal CPAP.    Behavioral health Continue current meds for anxiety, MDD/bipolar, PTSD pending valproic acid level. Psych to evaluate tomorrow morning.    DVT prophylaxis: Lovenox SQ. Code Status: Full code. Family Communication:  w patient Disposition Plan: Admit for IV antibiotics for 2 to 3 days and further work-up. Consults  called: Psychiatry has seen the patient. Admission status: Inpatient/telemetry.        Lab Results  Component Value Date   PLT 108 (L) 09/26/2017    Antibiotics  :  Rocephin 5/1=>  Anti-infectives (From admission, onward)   Start     Dose/Rate Route Frequency Ordered Stop   09/26/17 2345  cefTRIAXone (ROCEPHIN) 2 g in sodium chloride 0.9 % 100 mL IVPB     2 g 200 mL/hr over 30 Minutes Intravenous Daily at bedtime 09/26/17 2342          Objective:   Vitals:   09/26/17 2051 09/26/17 2133 09/26/17 2136 09/27/17 0119  BP:  126/68 126/68 (!) 100/58  Pulse:  100 (!) 102 88  Resp:  19 20 18   Temp:    98.1 F (36.7 C)  TempSrc:    Oral  SpO2:  99% 97% 100%  Weight: (!) 181.4 kg (400 lb)     Height: 5\' 9"  (1.753 m)       Wt Readings from Last 3 Encounters:  09/26/17 (!) 181.4 kg (400 lb)  08/13/17 (!) 178.3 kg (393 lb)  08/09/17 (!) 179.6 kg (396 lb)     Intake/Output Summary (Last 24 hours) at 09/27/2017 0531 Last data filed at 09/27/2017 0400 Gross per 24 hour  Intake 700 ml  Output -  Net 700 ml     Physical Exam  Awake Alert, Oriented X 3, No new F.N deficits, Normal affect [place].AT,PERRAL Supple Neck,No JVD, No cervical lymphadenopathy appriciated.  Symmetrical Chest wall movement, Good air movement bilaterally, CTAB RRR,No Gallops,Rubs or new Murmurs, No Parasternal Heave +ve B.Sounds, Abd Soft, No tenderness, No organomegaly appriciated, No rebound - guarding or rigidity. No Cyanosis, Clubbing or edema, No new Rash or bruise    Morbidly obese   Data Review:  CBC Recent Labs  Lab 09/26/17 2110  WBC 7.8  HGB 12.8  HCT 38.1  PLT 108*  MCV 94.8  MCH 31.8  MCHC 33.6  RDW 12.9    Chemistries  Recent Labs  Lab 09/26/17 2110 09/27/17 0132  NA 137  --   K 3.7  --   CL 99*  --   CO2 27  --   GLUCOSE 131*  --   BUN 27*  --   CREATININE 1.20*  --   CALCIUM 9.5  --   MG  --  2.2  AST 17  --   ALT 8*  --   ALKPHOS 52  --   BILITOT 0.7   --    ------------------------------------------------------------------------------------------------------------------ No results for input(s): CHOL, HDL, LDLCALC, TRIG, CHOLHDL, LDLDIRECT in the last 72 hours.  Lab Results  Component Value Date   HGBA1C 5.3 04/05/2017   ------------------------------------------------------------------------------------------------------------------ No results for input(s): TSH, T4TOTAL, T3FREE, THYROIDAB in the last 72 hours.  Invalid input(s): FREET3 ------------------------------------------------------------------------------------------------------------------ No results for input(s): VITAMINB12, FOLATE, FERRITIN, TIBC, IRON, RETICCTPCT in the last 72 hours.  Coagulation profile No results for input(s): INR, PROTIME in the last 168 hours.  No results for input(s): DDIMER in the last 72 hours.  Cardiac Enzymes No results for input(s): CKMB, TROPONINI, MYOGLOBIN in the last 168 hours.  Invalid input(s): CK ------------------------------------------------------------------------------------------------------------------    Component Value Date/Time   BNP 5.3 12/03/2015 1010    Inpatient Medications  Scheduled Meds: . atorvastatin  20 mg Oral q1800  . benztropine  1 mg Oral QHS  . celecoxib  200 mg Oral BID  . cloNIDine  0.1 mg Oral QHS  . divalproex  1,500 mg Oral QHS  . DULoxetine  60 mg Oral Daily  . enoxaparin (LOVENOX) injection  90 mg Subcutaneous Daily  . fluticasone furoate-vilanterol  1 puff Inhalation Daily  . lisinopril  20 mg Oral Daily   And  . hydrochlorothiazide  25 mg Oral Daily  . levothyroxine  112 mcg Oral QAC breakfast  . loratadine  10 mg Oral Daily  . pantoprazole  40 mg Oral Daily  . risperidone  4 mg Oral QHS   Continuous Infusions: . 0.9 % NaCl with KCl 20 mEq / L 125 mL/hr at 09/27/17 0220  . cefTRIAXone (ROCEPHIN)  IV Stopped (09/27/17 0034)   PRN Meds:.albuterol, fluticasone, polyethylene glycol,  promethazine, traMADol  Micro Results No results found for this or any previous visit (from the past 240 hour(s)).  Radiology Reports Ct Head Wo Contrast  Result Date: 09/26/2017 CLINICAL DATA:  Altered mental status. Increasing confusion over the last 2 weeks. Hallucinations. EXAM: CT HEAD WITHOUT CONTRAST TECHNIQUE: Contiguous axial images were obtained from the base of the skull through the vertex without intravenous contrast. COMPARISON:  10/21/2009 FINDINGS: Brain: No evidence of acute infarction, hemorrhage, hydrocephalus, extra-axial collection or mass lesion/mass effect. Mild cerebral atrophy. Vascular: No hyperdense vessel or unexpected calcification. Skull: Normal. Negative for fracture or focal lesion. Sinuses/Orbits: No acute finding. Other: None. IMPRESSION: No acute intracranial abnormalities.  Mild cerebral atrophy. Electronically Signed   By: Lucienne Capers M.D.   On: 09/26/2017 23:32    Time Spent in minutes  30   Jani Gravel M.D on 09/27/2017 at 5:31 AM  Between 7am to 7pm - Pager - [phone]    After 7pm go to [email or web address] - password [facility]  [facility] -  Office  [phone]

## 2017-09-28 DIAGNOSIS — R441 Visual hallucinations: Secondary | ICD-10-CM | POA: Diagnosis present

## 2017-09-28 DIAGNOSIS — E039 Hypothyroidism, unspecified: Secondary | ICD-10-CM

## 2017-09-28 DIAGNOSIS — I2510 Atherosclerotic heart disease of native coronary artery without angina pectoris: Secondary | ICD-10-CM

## 2017-09-28 LAB — COMPREHENSIVE METABOLIC PANEL
ALT: 9 U/L — ABNORMAL LOW (ref 14–54)
ANION GAP: 9 (ref 5–15)
AST: 17 U/L (ref 15–41)
Albumin: 3.2 g/dL — ABNORMAL LOW (ref 3.5–5.0)
Alkaline Phosphatase: 50 U/L (ref 38–126)
BILIRUBIN TOTAL: 0.4 mg/dL (ref 0.3–1.2)
BUN: 13 mg/dL (ref 6–20)
CO2: 26 mmol/L (ref 22–32)
Calcium: 9.2 mg/dL (ref 8.9–10.3)
Chloride: 104 mmol/L (ref 101–111)
Creatinine, Ser: 0.85 mg/dL (ref 0.44–1.00)
GFR calc Af Amer: >60 mL/min (ref >60)
Glucose, Bld: 112 mg/dL — ABNORMAL HIGH (ref 65–99)
POTASSIUM: 4.1 mmol/L (ref 3.5–5.1)
Sodium: 139 mmol/L (ref 135–145)
TOTAL PROTEIN: 6.2 g/dL — AB (ref 6.5–8.1)

## 2017-09-28 LAB — CBC
HEMATOCRIT: 35.6 % — AB (ref 36.0–46.0)
HEMOGLOBIN: 11.9 g/dL — AB (ref 12.0–15.0)
MCH: 31.3 pg (ref 26.0–34.0)
MCHC: 33.4 g/dL (ref 30.0–36.0)
MCV: 93.7 fL (ref 78.0–100.0)
Platelets: 71 10*3/uL — ABNORMAL LOW (ref 150–400)
RBC: 3.8 MIL/uL — ABNORMAL LOW (ref 3.87–5.11)
RDW: 12.8 % (ref 11.5–15.5)
WBC: 6.1 10*3/uL (ref 4.0–10.5)

## 2017-09-28 MED ORDER — DOXYCYCLINE HYCLATE 100 MG PO TABS: 100.0000 mg | ORAL_TABLET | Freq: Two times a day (BID) | ORAL | Status: DC

## 2017-09-28 MED ORDER — LEVOTHYROXINE SODIUM 25 MCG PO TABS
125.0000 ug | ORAL_TABLET | Freq: Every day | ORAL | Status: DC
  Administered 2017-09-29 – 2017-09-30 (×2): 125 ug via ORAL
  Filled 2017-09-28 (×2): qty 1

## 2017-09-28 NOTE — Evaluation (Signed)
Physical Therapy Evaluation Patient Details Name: Allison Sharp MRN: [id number] DOB: Oct 29, 1955 Today's Date: 09/28/2017   History of Present Illness  62 yo female admitted with acute encephalopathy, possible UTI. Hx of depression, bipolar, MI, PTSD, CHF, OA, morbid obesity, COPD-O2 dep, back pain, CHF    Clinical Impression  On eval, pt required Min assist +2 for assist/safety for mobility. She was able to stand and pivot from recliner to bed using a RW. Pt presents with general weakness, decreased activity tolerance, and impaired gait and balance. Discussed d/c plan-pt plans to return home with family member assisting PRN. Will follow and progress activity as tolerated. Recommend daily OOB to chair/BSC with nursing.     Follow Up Recommendations Home health PT;Supervision - Intermittent    Equipment Recommendations  None recommended by PT    Recommendations for Other Services       Precautions / Restrictions Precautions Precautions: Fall Precaution Comments: O2 dep Restrictions Weight Bearing Restrictions: No      Mobility  Bed Mobility Overal bed mobility: Needs Assistance Bed Mobility: Supine to Sit     Supine to sit: HOB elevated;Min assist     General bed mobility comments: some assist for LEs and to scoot to EOB. Increased time. Pt relied heavily on bedrail  Transfers Overall transfer level: Needs assistance Equipment used: Rolling walker (2 wheeled) Transfers: Sit to/from [facility] Sit to Stand: Min assist;+2 physical assistance;+2 safety/equipment;From elevated surface Stand pivot transfers: Min assist;+2 physical assistance;+2 safety/equipment       General transfer comment: Assist to rise, stabilize, control descent. Increased time. Stand pivot, bed to recliner, with RW. Pt faitgues easily.   Ambulation/Gait                Stairs            Wheelchair Mobility    Modified Rankin (Stroke Patients Only)        Balance Overall balance assessment: Needs assistance         Standing balance support: Bilateral upper extremity supported Standing balance-Leahy Scale: Poor                               Pertinent Vitals/Pain Pain Assessment: No/denies pain    Home Living Family/patient expects to be discharged to:: Private residence Living Arrangements: Other relatives Available Help at Discharge: Family;Available PRN/intermittently Type of Home: House       Home Layout: One level Home Equipment: Stacy - 2 wheels;Cane - single point      Prior Function                 Hand Dominance        Extremity/Trunk Assessment   Upper Extremity Assessment Upper Extremity Assessment: Defer to OT evaluation    Lower Extremity Assessment Lower Extremity Assessment: Generalized weakness    Cervical / Trunk Assessment Cervical / Trunk Assessment: Normal  Communication   Communication: No difficulties  Cognition Arousal/Alertness: Awake/alert Behavior During Therapy: WFL for tasks assessed/performed Overall Cognitive Status: Within Functional Limits for tasks assessed                                        General Comments      Exercises     Assessment/Plan    PT Assessment Patient needs continued PT services  PT Problem List  Decreased strength;Decreased balance;Decreased mobility;Decreased activity tolerance;Obesity       PT Treatment Interventions DME instruction;Gait training;Functional mobility training;Therapeutic activities;Balance training;Patient/family education;Therapeutic exercise    PT Goals (Current goals can be found in the Care Plan section)  Acute Rehab PT Goals Patient Stated Goal: home PT Goal Formulation: With patient Time For Goal Achievement: 10/12/17 Potential to Achieve Goals: Good    Frequency Min 3X/week   Barriers to discharge        Co-evaluation               AM-PAC PT "6 Clicks" Daily Activity   Outcome Measure Difficulty turning over in bed (including adjusting bedclothes, sheets and blankets)?: A Lot Difficulty moving from lying on back to sitting on the side of the bed? : Unable Difficulty sitting down on and standing up from a chair with arms (e.g., wheelchair, bedside commode, etc,.)?: Unable Help needed moving to and from a bed to chair (including a wheelchair)?: A Lot Help needed walking in hospital room?: A Lot Help needed climbing 3-5 steps with a railing? : A Lot 6 Click Score: 10    End of Session   Activity Tolerance: Patient limited by fatigue Patient left: in chair;with call bell/phone within reach   PT Visit Diagnosis: Difficulty in walking, not elsewhere classified (R26.2);Muscle weakness (generalized) (M62.81)    Time: 0254-2706 PT Time Calculation (min) (ACUTE ONLY): 28 min   Charges:   PT Evaluation $PT Eval Moderate Complexity: 1 Mod     PT G Codes:          Weston Anna, MPT Pager: [phone]

## 2017-09-28 NOTE — Progress Notes (Signed)
Patient ID: Allison Sharp, female   DOB: 02/09/1956, 62 y.o.   MRN: [id number]                                                                PROGRESS NOTE                                                                                                                                                                                                             Patient Demographics:    Allison Sharp, is a 62 y.o. female, DOB - April 28, 1956, LZJ:[id number]  Admit date - 09/26/2017   Admitting Physician Reubin Milan, MD  Outpatient Primary MD for the patient is Dettinger, Fransisca Kaufmann, MD  LOS - 2  Outpatient Specialists:     Chief Complaint  Patient presents with  . Hallucinations  . Altered Mental Status       Brief Narrative   62 y.o.femalewith medical history significant ofseasonal allergies, anxiety, depression, bipolar 1 disorder, PTSD, osteoarthritis, asthma/COPD, CAD, history of MI, chronic diastolic CHF, chronic back pain, chronic headaches, GERD, hypertension, hyperlipidemia, hypothyroidism, OSA on CPAP who isbrought by her nieceto the emergency department due toworsening mental status. The patient for the past 2 months has been having some confusion, but over the past 2 weeks her episodes of confusion havebeen increasing in frequency and intensity. This seems to be worse when the patient becomes hypoxic. The patient has been having some auditory hallucinations, but is currently not listening to them. She is oriented to self and place, but is otherwise disoriented and unable to contribute to the HPI. History is taken from her niece Allison Sharp.  ED Course:Initial vital signs temperature 98.2 F, pulse 95, respirations 18, blood pressure 124/85 mmHg and O2 sat 100% on nasal cannula oxygen.  Her work-up shows a urinalysis with cloudy urine, positive nitrites, large leukocyte esterase, few bacteria and >50 WBC per hpf. White count 7.8, hemoglobin 12.8 and platelets 108.  Chemistry shows a sodium 199 mmol/L. Glucose 131, BUN 27 creatinine 1.20 mg/dL. All other values are within normal limits.Her CT head did not show any acute intracranial abnormalities.       Subjective:    [name] today seems more alert.  axox2 (not time). Per her family last nite she has had worsening memory and  intermittent confusion over the past 2 months. Per H/P psychiatry was supposed to see patient but have not seen a note yet.  Pt notes slight dysuria.  Afebrile,  Denies n/v, flank pain, hematuria. Awaiting culture. Pt still hallucination today. Plt lower, but no bleeding   No headache, No chest pain, No abdominal pain - No Nausea, No new weakness tingling or numbness, No Cough - SOB.    Assessment  & Plan :    Principal Problem:   Acute encephalopathy Active Problems:   Hypothyroidism   HYPERCHOLESTEROLEMIA   GERD   Coronary artery disease   OSA on CPAP   Essential hypertension, benign   Lower urinary tract infectious disease   Auditory hallucinations   Principal Problem: Acute encephalopathy Unknown etiology, but likely could be UTI. Continue IV antibiotics for UTI. Check TSH=> 5.634 ,  and valproic acid level=> wnl   Active Problems: Urinary tract infection Continue ceftriaxone 2 g IVPB every 24 hours. Urine culture ordered, last time had resistant bacteria may need to switch abx Urine cx=> >100,000 CFU E. coli  Thrombocytopenia  HIT panel 5/3 DC lovenox Check cbc in am  Hypothyroidism Cont levothyroxine, increase from  112 micrograms To 117micrograms po qday   GERD Protonix 40 mg p.o. daily.  Coronary artery disease No complaints of chest pain when examined. Continue atorvastatin and antihypertensives.  Essential hypertension, benign Continue clonidine 0.1 mg p.o. at bedtime. Continue Prinzide 20-25 mg p.o. daily. Monitor blood pressure, heart rate, renal function and  electrolytes.  Hypercholesterolemia Continue atorvastatin.  OSA on CPAP Continue nocturnal CPAP.  Behavioral health Continue current meds for anxiety,MDD/bipolar, PTSD pending valproic acid level. Psych to evaluate, consult called in , appreciate input    DVT prophylaxis: SCD  Code Status:Full code. Family Communication: w patient Disposition Plan:Admit for IV antibiotics for 2 to 3 days and further work-up. Consults called:Psychiatry has seen the patient. Admission status:Inpatient/telemetry.          Antibiotics  :  Rocephin 5/1=>   Lab Results  Component Value Date   PLT 71 (L) 09/28/2017     Anti-infectives (From admission, onward)   Start     Dose/Rate Route Frequency Ordered Stop   09/28/17 1500  doxycycline (VIBRA-TABS) tablet 100 mg  Status:  Discontinued     100 mg Oral Every 12 hours 09/28/17 1410 09/28/17 1411   09/26/17 2345  cefTRIAXone (ROCEPHIN) 2 g in sodium chloride 0.9 % 100 mL IVPB     2 g 200 mL/hr over 30 Minutes Intravenous Daily at bedtime 09/26/17 2342          Objective:   Vitals:   09/28/17 0431 09/28/17 1019 09/28/17 1148 09/28/17 1621  BP: 128/73   105/74  Pulse: 91   90  Resp: 20   18  Temp: 97.8 F (36.6 C)   98.4 F (36.9 C)  TempSrc: Oral   Oral  SpO2: 99% 98% 98%   Weight:      Height:        Wt Readings from Last 3 Encounters:  09/27/17 (!) 182.7 kg (402 lb 11.2 oz)  08/13/17 (!) 178.3 kg (393 lb)  08/09/17 (!) 179.6 kg (396 lb)     Intake/Output Summary (Last 24 hours) at 09/28/2017 1656 Last data filed at 09/28/2017 1009 Gross per 24 hour  Intake 460 ml  Output 5550 ml  Net -5090 ml     Physical Exam  Awake Alert, Oriented X 2, No new F.N deficits, Normal affect  [place].AT,PERRAL Supple Neck,No JVD, No cervical lymphadenopathy appriciated.  Symmetrical Chest wall movement, Good air movement bilaterally, CTAB RRR,No Gallops,Rubs or new Murmurs, No Parasternal Heave +ve  B.Sounds, Abd Soft, No tenderness, No organomegaly appriciated, No rebound - guarding or rigidity. No Cyanosis, Clubbing or edema, No new Rash or bruise   Morbidly obsese, hallucination (visual), see people that aren't there   Data Review:    CBC Recent Labs  Lab 09/26/17 2110 09/27/17 0840 09/28/17 0606  WBC 7.8 9.1 6.1  HGB 12.8 12.0 11.9*  HCT 38.1 36.0 35.6*  PLT 108* 87* 71*  MCV 94.8 94.7 93.7  MCH 31.8 31.6 31.3  MCHC 33.6 33.3 33.4  RDW 12.9 13.1 12.8  LYMPHSABS  --  1.8  --   MONOABS  --  1.0  --   EOSABS  --  0.1  --   BASOSABS  --  0.0  --     Chemistries  Recent Labs  Lab 09/26/17 2110 09/27/17 0132 09/27/17 0840 09/28/17 0606  NA 137  --  138 139  K 3.7  --  4.1 4.1  CL 99*  --  101 104  CO2 27  --  27 26  GLUCOSE 131*  --  116* 112*  BUN 27*  --  24* 13  CREATININE 1.20*  --  1.11* 0.85  CALCIUM 9.5  --  9.3 9.2  MG  --  2.2  --   --   AST 17  --   --  17  ALT 8*  --   --  9*  ALKPHOS 52  --   --  50  BILITOT 0.7  --   --  0.4   ------------------------------------------------------------------------------------------------------------------ No results for input(s): CHOL, HDL, LDLCALC, TRIG, CHOLHDL, LDLDIRECT in the last 72 hours.  Lab Results  Component Value Date   HGBA1C 5.3 04/05/2017   ------------------------------------------------------------------------------------------------------------------ Recent Labs    09/27/17 0840  TSH 5.634*   ------------------------------------------------------------------------------------------------------------------ No results for input(s): VITAMINB12, FOLATE, FERRITIN, TIBC, IRON, RETICCTPCT in the last 72 hours.  Coagulation profile No results for input(s): INR, PROTIME in the last 168 hours.  No results for input(s): DDIMER in the last 72 hours.  Cardiac Enzymes No results for input(s): CKMB, TROPONINI, MYOGLOBIN in the last 168 hours.  Invalid input(s):  CK ------------------------------------------------------------------------------------------------------------------    Component Value Date/Time   BNP 5.3 12/03/2015 1010    Inpatient Medications  Scheduled Meds: . atorvastatin  20 mg Oral q1800  . benztropine  1 mg Oral QHS  . celecoxib  200 mg Oral BID  . cloNIDine  0.1 mg Oral QHS  . divalproex  1,500 mg Oral QHS  . DULoxetine  60 mg Oral Daily  . fluticasone furoate-vilanterol  1 puff Inhalation Daily  . lisinopril  20 mg Oral Daily   And  . hydrochlorothiazide  25 mg Oral Daily  . levothyroxine  112 mcg Oral QAC breakfast  . loratadine  10 mg Oral Daily  . pantoprazole  40 mg Oral Daily  . risperidone  4 mg Oral QHS   Continuous Infusions: . cefTRIAXone (ROCEPHIN)  IV Stopped (09/27/17 2146)   PRN Meds:.albuterol, fluticasone, polyethylene glycol, promethazine, traMADol  Micro Results Recent Results (from the past 240 hour(s))  Culture, Urine     Status: Abnormal (Preliminary result)   Collection Time: 09/26/17 11:19 PM  Result Value Ref Range Status   Specimen Description   Final    URINE, CLEAN CATCH Performed at [facility], [address] [place],  [address]    Special Requests   Final    NONE Performed at [facility], [place] [address], [place],  [address]    Culture >=100,000 COLONIES/mL ESCHERICHIA COLI (A)  Final   Report Status PENDING  Incomplete    Radiology Reports Ct Head Wo Contrast  Result Date: 09/26/2017 CLINICAL DATA:  Altered mental status. Increasing confusion over the last 2 weeks. Hallucinations. EXAM: CT HEAD WITHOUT CONTRAST TECHNIQUE: Contiguous axial images were obtained from the base of the skull through the vertex without intravenous contrast. COMPARISON:  10/21/2009 FINDINGS: Brain: No evidence of acute infarction, hemorrhage, hydrocephalus, extra-axial collection or mass lesion/mass effect. Mild cerebral atrophy. Vascular: No  hyperdense vessel or unexpected calcification. Skull: Normal. Negative for fracture or focal lesion. Sinuses/Orbits: No acute finding. Other: None. IMPRESSION: No acute intracranial abnormalities.  Mild cerebral atrophy. Electronically Signed   By: Lucienne Capers M.D.   On: 09/26/2017 23:32    Time Spent in minutes  30   Jani Gravel M.D on 09/28/2017 at 4:56 PM  Between 7am to 7pm - Pager - [phone]    After 7pm go to [email or web address] - password [facility]  [facility] -  Office  [phone]

## 2017-09-28 NOTE — Evaluation (Signed)
Occupational Therapy Evaluation Patient Details Name: Allison Sharp MRN: [id number] DOB: 27-Nov-1955 Today's Date: 09/28/2017    History of Present Illness 62 yo female admitted with acute encephalopathy, possible UTI. Hx of depression, bipolar, MI, PTSD, CHF, OA, morbid obesity, COPD-O2 dep, back pain, CHF   Clinical Impression   Pt was admitted for the above.  She states that her niece assists with adls at baseline. She is able to get to the toilet herself when her niece is out (but she is pretty much around).  Will follow in acute setting with supervision level goal for toilet.  Will further educate on AE to improve independence as well as UE exercise program    Follow Up Recommendations  Home health OT    Equipment Recommendations  None recommended by OT    Recommendations for Other Services       Precautions / Restrictions Precautions Precautions: Fall Precaution Comments: O2 dep Restrictions Weight Bearing Restrictions: No      Mobility Bed Mobility Overal bed mobility: Needs Assistance Bed Mobility: Supine to Sit     Supine to sit: HOB elevated;Min assist     General bed mobility comments: some assist for LEs and to scoot to EOB. Increased time. Pt relied heavily on bedrail  Transfers Overall transfer level: Needs assistance Equipment used: Rolling walker (2 wheeled) Transfers: Sit to/from [facility] Sit to Stand: Min assist;+2 physical assistance;+2 safety/equipment;From elevated surface Stand pivot transfers: Min assist;+2 physical assistance;+2 safety/equipment       General transfer comment: Assist to rise, stabilize, control descent. Increased time. Stand pivot, bed to recliner, with RW. Pt faitgues easily.     Balance Overall balance assessment: Needs assistance         Standing balance support: Bilateral upper extremity supported Standing balance-Leahy Scale: Poor                             ADL either performed  or assessed with clinical judgement   ADL Overall ADL's : Needs assistance/impaired Eating/Feeding: Independent   Grooming: Set up;Sitting   Upper Body Bathing: Minimal assistance;Sitting   Lower Body Bathing: Total assistance;Sit to/from stand;+2 for safety/equipment   Upper Body Dressing : Sitting;Minimal assistance;Moderate assistance   Lower Body Dressing: Total assistance;Sit to/from stand;+2 for safety/equipment   Toilet Transfer: Minimal assistance;+2 for safety/equipment;Stand-pivot;RW(chair)   Toileting- Clothing Manipulation and Hygiene: Total assistance;+2 for safety/equipment;Sit to/from stand         General ADL Comments: niece assists pt at baseline. She was able to get to toilet herself if niece wasn't present     Vision         Perception     Praxis      Pertinent Vitals/Pain Pain Assessment: No/denies pain     Hand Dominance     Extremity/Trunk Assessment Upper Extremity Assessment Upper Extremity Assessment: Generalized weakness(grossly 3+/5)   Lower Extremity Assessment Lower Extremity Assessment: Generalized weakness   Cervical / Trunk Assessment Cervical / Trunk Assessment: Normal   Communication Communication Communication: No difficulties   Cognition Arousal/Alertness: Awake/alert Behavior During Therapy: WFL for tasks assessed/performed Overall Cognitive Status: Within Functional Limits for tasks assessed                                     General Comments   encouraged AROM on her own; will provide ball to squeeze for  bil UEs and upgrade to level one theraband as tolerated    Exercises     Shoulder Instructions      Home Living Family/patient expects to be discharged to:: Private residence Living Arrangements: Other relatives Available Help at Discharge: Family;Available PRN/intermittently Type of Home: House       Home Layout: One level     Bathroom Shower/Tub: Teacher, early years/pre:  Standard     Home Equipment: Environmental consultant - 2 wheels;Cane - single point   Additional Comments: uses cane; has BSC      Prior Functioning/Environment Level of Independence: Needs assistance    ADL's / Homemaking Assistance Needed: niece assists with adls   Comments: pt states niece needs knee surgery        OT Problem List: Decreased strength;Decreased activity tolerance;Impaired balance (sitting and/or standing);Decreased knowledge of use of DME or AE;Pain;Obesity      OT Treatment/Interventions: Self-care/ADL training;Energy conservation;DME and/or AE instruction;Patient/family education;Balance training;Therapeutic activities    OT Goals(Current goals can be found in the care plan section) Acute Rehab OT Goals Patient Stated Goal: home OT Goal Formulation: With patient Time For Goal Achievement: 10/12/17 Potential to Achieve Goals: Good  OT Frequency: Min 2X/week   Barriers to D/C:            Co-evaluation              AM-PAC PT "6 Clicks" Daily Activity     Outcome Measure Help from another person eating meals?: None Help from another person taking care of personal grooming?: A Little Help from another person toileting, which includes using toliet, bedpan, or urinal?: A Lot Help from another person bathing (including washing, rinsing, drying)?: A Lot Help from another person to put on and taking off regular upper body clothing?: A Lot Help from another person to put on and taking off regular lower body clothing?: Total 6 Click Score: 14   End of Session Nurse Communication: Mobility status  Activity Tolerance: Patient tolerated treatment well Patient left: in chair;with call bell/phone within reach;with chair alarm set  OT Visit Diagnosis: Muscle weakness (generalized) (M62.81)                Time: 1448-1856 OT Time Calculation (min): 32 min Charges:  OT General Charges $OT Visit: 1 Visit OT Evaluation $OT Eval Moderate Complexity: 1 Mod G-Codes:      [place], OTR/L [phone] 09/28/2017  Ridley Dileo 09/28/2017, 12:39 PM

## 2017-09-29 DIAGNOSIS — R0602 Shortness of breath: Secondary | ICD-10-CM

## 2017-09-29 DIAGNOSIS — Z818 Family history of other mental and behavioral disorders: Secondary | ICD-10-CM

## 2017-09-29 DIAGNOSIS — R450 Nervousness: Secondary | ICD-10-CM

## 2017-09-29 DIAGNOSIS — M791 Myalgia, unspecified site: Secondary | ICD-10-CM

## 2017-09-29 DIAGNOSIS — D696 Thrombocytopenia, unspecified: Secondary | ICD-10-CM | POA: Diagnosis present

## 2017-09-29 DIAGNOSIS — F419 Anxiety disorder, unspecified: Secondary | ICD-10-CM

## 2017-09-29 DIAGNOSIS — F319 Bipolar disorder, unspecified: Secondary | ICD-10-CM

## 2017-09-29 DIAGNOSIS — F4310 Post-traumatic stress disorder, unspecified: Secondary | ICD-10-CM

## 2017-09-29 DIAGNOSIS — R413 Other amnesia: Secondary | ICD-10-CM

## 2017-09-29 LAB — CBC
HEMATOCRIT: 33.1 % — AB (ref 36.0–46.0)
Hemoglobin: 11 g/dL — ABNORMAL LOW (ref 12.0–15.0)
MCH: 31.6 pg (ref 26.0–34.0)
MCHC: 33.2 g/dL (ref 30.0–36.0)
MCV: 95.1 fL (ref 78.0–100.0)
PLATELETS: 81 10*3/uL — AB (ref 150–400)
RBC: 3.48 MIL/uL — ABNORMAL LOW (ref 3.87–5.11)
RDW: 13.5 % (ref 11.5–15.5)
WBC: 6.6 10*3/uL (ref 4.0–10.5)

## 2017-09-29 LAB — URINE CULTURE: Culture: >=100000 — AB

## 2017-09-29 LAB — COMPREHENSIVE METABOLIC PANEL
ALBUMIN: 3 g/dL — AB (ref 3.5–5.0)
ALK PHOS: 45 U/L (ref 38–126)
ALT: 8 U/L — AB (ref 14–54)
AST: 15 U/L (ref 15–41)
Anion gap: 11 (ref 5–15)
BUN: 14 mg/dL (ref 6–20)
CALCIUM: 9.4 mg/dL (ref 8.9–10.3)
CHLORIDE: 99 mmol/L — AB (ref 101–111)
CO2: 27 mmol/L (ref 22–32)
CREATININE: 0.96 mg/dL (ref 0.44–1.00)
GFR calc Af Amer: >60 mL/min (ref >60)
GFR calc non Af Amer: >60 mL/min (ref >60)
GLUCOSE: 102 mg/dL — AB (ref 65–99)
Potassium: 4.1 mmol/L (ref 3.5–5.1)
Sodium: 137 mmol/L (ref 135–145)
Total Bilirubin: 0.5 mg/dL (ref 0.3–1.2)
Total Protein: 5.9 g/dL — ABNORMAL LOW (ref 6.5–8.1)

## 2017-09-29 LAB — GLUCOSE, CAPILLARY: Glucose-Capillary: 154 mg/dL — ABNORMAL HIGH (ref 65–99)

## 2017-09-29 LAB — HEPARIN INDUCED PLATELET AB (HIT ANTIBODY): Heparin Induced Plt Ab: 0.118 OD (ref 0.000–0.400)

## 2017-09-29 MED ORDER — PRO-STAT SUGAR FREE PO LIQD
30.0000 mL | Freq: Two times a day (BID) | ORAL | Status: DC
  Administered 2017-09-29 – 2017-09-30 (×3): 30 mL via ORAL
  Filled 2017-09-29 (×3): qty 30

## 2017-09-29 NOTE — Progress Notes (Signed)
Patient ID: SAKIYAH SHUR, female   DOB: 1955/08/17, 62 y.o.   MRN: [id number]                                                                PROGRESS NOTE                                                                                                                                                                                                             Patient Demographics:    Allison Sharp, is a 62 y.o. female, DOB - 28-Oct-1955, NWG:[id number]  Admit date - 09/26/2017   Admitting Physician Reubin Milan, MD  Outpatient Primary MD for the patient is Dettinger, Fransisca Kaufmann, MD  LOS - 3  Outpatient Specialists:     Chief Complaint  Patient presents with  . Hallucinations  . Altered Mental Status       Brief Narrative      62 y.o.femalewith medical history significant ofseasonal allergies, anxiety, depression, bipolar 1 disorder, PTSD, osteoarthritis, asthma/COPD, CAD, history of MI, chronic diastolic CHF, chronic back pain, chronic headaches, GERD, hypertension, hyperlipidemia, hypothyroidism, OSA on CPAP who isbrought by her nieceto the emergency department due toworsening mental status. The patient for the past 2 months has been having some confusion, but over the past 2 weeks her episodes of confusion havebeen increasing in frequency and intensity. This seems to be worse when the patient becomes hypoxic. The patient has been having some auditory hallucinations, but is currently not listening to them. She is oriented to self and place, but is otherwise disoriented and unable to contribute to the HPI. History is taken from her niece [name].  ED Course:Initial vital signs temperature 98.2 F, pulse 95, respirations 18, blood pressure 124/85 mmHg and O2 sat 100% on nasal cannula oxygen.  Her work-up shows a urinalysis with cloudy urine, positive nitrites, large leukocyte esterase, few bacteria and >50 WBC per hpf. White count 7.8, hemoglobin 12.8 and platelets 108.  Chemistry shows a sodium 199 mmol/L. Glucose 131, BUN 27 creatinine 1.20 mg/dL. All other values are within normal limits.Her CT head did not show any acute intracranial abnormalities.     Subjective:    Allison Sharp today is much more alert today.  aox2 (almost has time).  Pt still continues to have AV  hallucination.   No headache, No chest pain, No abdominal pain - No Nausea, No new weakness tingling or numbness, No Cough - SOB.     Assessment  & Plan :    Principal Problem:   Acute encephalopathy Active Problems:   Hypothyroidism   HYPERCHOLESTEROLEMIA   GERD   Coronary artery disease   OSA on CPAP   Essential hypertension, benign   Lower urinary tract infectious disease   Auditory hallucinations   Visual hallucinations     Principal Problem: Acute encephalopathy Unknown etiology, but likely could be UTI. Continue IV antibiotics for UTI. Will change to PO tomorrow Check TSH=> 5.634 ,and valproic acid level=> wnl   Active Problems: Urinary tract infection Continue ceftriaxone 2 g IVPB every 24 hours. Urine cx=> >100,000 CFU E. Coli, resistant to cipro  Thrombocytopenia  HIT panel 5/3 pending DC lovenox Check cbc in am  Hypothyroidism Cont levothyroxine, increase from  112 micrograms To 139micrograms po qday (5/3)   GERD Protonix 40 mg p.o. daily.  Coronary artery disease No complaints of chest pain when examined. Continue atorvastatin and antihypertensives.  Essential hypertension, benign Continue clonidine 0.1 mg p.o. at bedtime. Continue Prinzide 20-25 mg p.o. daily. Monitor blood pressure, heart rate, renal function and electrolytes.  Hypercholesterolemia Continue atorvastatin.  OSA on CPAP Continue nocturnal CPAP.  Behavioral health Continue current meds for anxiety,MDD/bipolar, PTSD pending valproic acid level. Psych to evaluate, consult called in , appreciate input        Code Status : FULL  CODE  Family Communication  : left message for daughter  Disposition Plan  :  home  Barriers For Discharge :   Consults  :   Psychiatry, PT=> home with home PT  Procedures  :   DVT Prophylaxis  :  SCDs   Lab Results  Component Value Date   PLT 81 (L) 09/29/2017    Antibiotics  : rocephin 5/1 =>  Anti-infectives (From admission, onward)   Start     Dose/Rate Route Frequency Ordered Stop   09/28/17 1500  doxycycline (VIBRA-TABS) tablet 100 mg  Status:  Discontinued     100 mg Oral Every 12 hours 09/28/17 1410 09/28/17 1411   09/26/17 2345  cefTRIAXone (ROCEPHIN) 2 g in sodium chloride 0.9 % 100 mL IVPB     2 g 200 mL/hr over 30 Minutes Intravenous Daily at bedtime 09/26/17 2342          Objective:   Vitals:   09/28/17 1148 09/28/17 1621 09/28/17 2042 09/29/17 0433  BP:  105/74 108/65 97/60  Pulse:  90 85 87  Resp:  18 20 18   Temp:  98.4 F (36.9 C) 98.1 F (36.7 C) 98 F (36.7 C)  TempSrc:  Oral Oral Oral  SpO2: 98%  97% 98%  Weight:      Height:        Wt Readings from Last 3 Encounters:  09/27/17 (!) 182.7 kg (402 lb 11.2 oz)  08/13/17 (!) 178.3 kg (393 lb)  08/09/17 (!) 179.6 kg (396 lb)     Intake/Output Summary (Last 24 hours) at 09/29/2017 1106 Last data filed at 09/29/2017 0531 Gross per 24 hour  Intake 280 ml  Output 1400 ml  Net -1120 ml     Physical Exam  Awake Alert, Oriented X 3, No new F.N deficits, Normal affect [place].AT,PERRAL Supple Neck,No JVD, No cervical lymphadenopathy appriciated.  Symmetrical Chest wall movement, Good air movement bilaterally, CTAB RRR,No Gallops,Rubs or new Murmurs, No Parasternal Heave +  ve B.Sounds, Abd Soft, No tenderness, No organomegaly appriciated, No rebound - guarding or rigidity. No Cyanosis, Clubbing or edema, No new Rash or bruise  Slight tremor right hand     Data Review:    CBC Recent Labs  Lab 09/26/17 2110 09/27/17 0840 09/28/17 0606 09/29/17 0512  WBC 7.8 9.1 6.1 6.6  HGB 12.8 12.0  11.9* 11.0*  HCT 38.1 36.0 35.6* 33.1*  PLT 108* 87* 71* 81*  MCV 94.8 94.7 93.7 95.1  MCH 31.8 31.6 31.3 31.6  MCHC 33.6 33.3 33.4 33.2  RDW 12.9 13.1 12.8 13.5  LYMPHSABS  --  1.8  --   --   MONOABS  --  1.0  --   --   EOSABS  --  0.1  --   --   BASOSABS  --  0.0  --   --     Chemistries  Recent Labs  Lab 09/26/17 2110 09/27/17 0132 09/27/17 0840 09/28/17 0606 09/29/17 0512  NA 137  --  138 139 137  K 3.7  --  4.1 4.1 4.1  CL 99*  --  101 104 99*  CO2 27  --  27 26 27   GLUCOSE 131*  --  116* 112* 102*  BUN 27*  --  24* 13 14  CREATININE 1.20*  --  1.11* 0.85 0.96  CALCIUM 9.5  --  9.3 9.2 9.4  MG  --  2.2  --   --   --   AST 17  --   --  17 15  ALT 8*  --   --  9* 8*  ALKPHOS 52  --   --  50 45  BILITOT 0.7  --   --  0.4 0.5   ------------------------------------------------------------------------------------------------------------------ No results for input(s): CHOL, HDL, LDLCALC, TRIG, CHOLHDL, LDLDIRECT in the last 72 hours.  Lab Results  Component Value Date   HGBA1C 5.3 04/05/2017   ------------------------------------------------------------------------------------------------------------------ Recent Labs    09/27/17 0840  TSH 5.634*   ------------------------------------------------------------------------------------------------------------------ No results for input(s): VITAMINB12, FOLATE, FERRITIN, TIBC, IRON, RETICCTPCT in the last 72 hours.  Coagulation profile No results for input(s): INR, PROTIME in the last 168 hours.  No results for input(s): DDIMER in the last 72 hours.  Cardiac Enzymes No results for input(s): CKMB, TROPONINI, MYOGLOBIN in the last 168 hours.  Invalid input(s): CK ------------------------------------------------------------------------------------------------------------------    Component Value Date/Time   BNP 5.3 12/03/2015 1010    Inpatient Medications  Scheduled Meds: . atorvastatin  20 mg Oral q1800  .  benztropine  1 mg Oral QHS  . celecoxib  200 mg Oral BID  . cloNIDine  0.1 mg Oral QHS  . divalproex  1,500 mg Oral QHS  . DULoxetine  60 mg Oral Daily  . fluticasone furoate-vilanterol  1 puff Inhalation Daily  . lisinopril  20 mg Oral Daily   And  . hydrochlorothiazide  25 mg Oral Daily  . levothyroxine  125 mcg Oral QAC breakfast  . loratadine  10 mg Oral Daily  . pantoprazole  40 mg Oral Daily  . risperidone  4 mg Oral QHS   Continuous Infusions: . cefTRIAXone (ROCEPHIN)  IV Stopped (09/28/17 2309)   PRN Meds:.albuterol, fluticasone, polyethylene glycol, promethazine, traMADol  Micro Results Recent Results (from the past 240 hour(s))  Culture, Urine     Status: Abnormal   Collection Time: 09/26/17 11:19 PM  Result Value Ref Range Status   Specimen Description   Final    URINE, CLEAN CATCH  Performed at [facility], [place] [address], [place], [place] [address]    Special Requests   Final    NONE Performed at [facility], [place] [address] [place], [place] [address]    Culture >=100,000 COLONIES/mL ESCHERICHIA COLI (A)  Final   Report Status 09/29/2017 FINAL  Final   Organism ID, Bacteria ESCHERICHIA COLI (A)  Final      Susceptibility   Escherichia coli - MIC*    AMPICILLIN <=2 SENSITIVE Sensitive     CEFAZOLIN <=4 SENSITIVE Sensitive     CEFTRIAXONE <=1 SENSITIVE Sensitive     CIPROFLOXACIN >=4 RESISTANT Resistant     GENTAMICIN <=1 SENSITIVE Sensitive     IMIPENEM <=0.25 SENSITIVE Sensitive     NITROFURANTOIN <=16 SENSITIVE Sensitive     TRIMETH/SULFA <=20 SENSITIVE Sensitive     AMPICILLIN/SULBACTAM <=2 SENSITIVE Sensitive     PIP/TAZO <=4 SENSITIVE Sensitive     Extended ESBL NEGATIVE Sensitive     * >=100,000 COLONIES/mL ESCHERICHIA COLI    Radiology Reports Ct Head Wo Contrast  Result Date: 09/26/2017 CLINICAL DATA:  Altered mental status. Increasing confusion over the last 2 weeks. Hallucinations. EXAM: CT HEAD WITHOUT  CONTRAST TECHNIQUE: Contiguous axial images were obtained from the base of the skull through the vertex without intravenous contrast. COMPARISON:  10/21/2009 FINDINGS: Brain: No evidence of acute infarction, hemorrhage, hydrocephalus, extra-axial collection or mass lesion/mass effect. Mild cerebral atrophy. Vascular: No hyperdense vessel or unexpected calcification. Skull: Normal. Negative for fracture or focal lesion. Sinuses/Orbits: No acute finding. Other: None. IMPRESSION: No acute intracranial abnormalities.  Mild cerebral atrophy. Electronically Signed   By: Lucienne Capers M.D.   On: 09/26/2017 23:32    Time Spent in minutes  30   Jani Gravel M.D on 09/29/2017 at 11:06 AM  Between 7am to 7pm - Pager - [phone]  After 7pm go to [email or web address] - password [facility]  [facility] -  Office  [phone]

## 2017-09-29 NOTE — Consult Note (Signed)
[facility] Consult   Reason for Consult:  ''memory loss, hallucinations.'' Referring Physician:  Dr. Maudie Mercury Patient Identification: LAWAN NANEZ MRN:  [id number] Principal Diagnosis: Acute encephalopathy Diagnosis:   Patient Active Problem List   Diagnosis Date Noted  . Thrombocytopenia ([place]) [D69.6] 09/29/2017  . Visual hallucinations [R44.1]   . Auditory hallucinations [R44.0]   . Acute encephalopathy [G93.40] 09/26/2017  . Lower urinary tract infectious disease [N39.0] 09/26/2017  . Bipolar I disorder, most recent episode depressed, severe w psychosis ([place]) [F31.5] 06/21/2016  . History of MI (myocardial infarction) [I25.2] 06/21/2016  . Spinal stenosis of lumbar region with neurogenic claudication [M48.062] 03/29/2016  . Severe obesity (BMI >= 40) ([place]) [E66.01] 07/05/2015  . Hyperlipidemia LDL goal <130 [E78.5] 07/05/2015  . MDD (major depressive disorder), single episode, severe with psychotic features ([place]) [F32.3] 01/21/2015  . 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 Bilateral knee pain [M25.561, M25.562] 01/07/2014  . Scar condition and fibrosis of skin [L90.5] 12/11/2013  . Essential hypertension, benign [I10] 07/01/2013  . Asthma with acute exacerbation [J45.901] 11/04/2012  . OSA on CPAP [G47.33, Z99.89] 11/04/2012  . Back pain, chronic [M54.9, G89.29] 08/27/2012  . Vitamin D insufficiency [E55.9] 04/04/2012  . Insomnia due to mental disorder [F51.05] 04/04/2012  . Anxiety [F41.9] 04/04/2012  . OCD (obsessive compulsive disorder) [F42.9] 04/04/2012  . PTSD (post-traumatic stress disorder) [F43.10] 06/28/2011  . Coronary artery disease [I25.10] 08/26/2010  . FATTY LIVER DISEASE [K76.89] 03/29/2009  . GERD [K21.9] 03/01/2009  . Morbid obesity ([place]) [E66.01] 02/15/2009  . Hypothyroidism [E03.9] 02/12/2009  . HYPERCHOLESTEROLEMIA [E78.00] 02/12/2009    Total Time spent with patient: 45 minutes  Subjective:   Allison Sharp is a 62 y.o.  female patient admitted with was admitted due to hallucinations and altered mental status.  HPI:  Patient who reports history of Depression, Psychosis, Anxiety, Bipolar 1 disorder, PTSD, osteoarthritis, asthma/COPD, CAD, history of MI, chronic diastolic CHF, chronic back pain, chronic headaches, GERD, hypertension, hyperlipidemia, hypothyroidism and OSA on CPAP who was admitted due to hallucinations and worsening mental status. Today, patient is alert and oriented x 3. She reports that she was hearing noises and seeing shadows until yesterday. Patient denies current auditory/visual hallucinations, depression, anxiety SI/HI or delusional thinking. She states that she has been feeling better since was placed on IV antibiotics. Urine analysis revealed urinary tract infections. Patient reports that she is willing to follow up with Dr. Adele Schilder at [facility] outpatient [facility] upon discharge.   Past Psychiatric History: as above  Risk to Self: Is patient at risk for suicide?: No Risk to Others:   Prior Inpatient Therapy:   Prior Outpatient Therapy:    Past Medical History:  Past Medical History:  Diagnosis Date  . Allergy   . Anxiety   . Arthritis   . Asthma   . Bipolar 1 disorder ([place])   . CAD (coronary artery disease)   . Cellulitis   . CHF (congestive heart failure) ([facility])    diastolic dysfunction  . Chronic back pain   . Chronic headaches   . Complication of anesthesia    States she typically gets sick s/p anesthesia  . Contusion of sacrum   . COPD (chronic obstructive pulmonary disease) ([place])   . Depression   . Dyspnea    PFT 03/05/09 FEV1 2.77(98%), FVC 3.25(86%), FEV1% 85, TLC 5.88(99%), DLCO 60% ,  Methacholine challenge 03/16/09 normal ,  CT chest 03/12/09 no pulmonary disease  . Fungal infection   . GERD (gastroesophageal  reflux disease)   . History of colonoscopy 10/17/2002   by Dr Rehman-> distal non-specific proctitis, small ext hemorrhoids,   . HTN (hypertension)   .  Hyperlipidemia   . Hyperthyroidism   . Hypothyroidism    States she only has hyperthyroidism  . Migraine headache   . Morbid obesity with body mass index of 50.0-59.9 in adult [facility]) JAN 2011 370 LBS   2004 311 BMI 45.9  . Myocardial infarction ([place])    NOV 1997  . OSA on CPAP    she had been on 2L O2 at night but that was stopped  . PONV (postoperative nausea and vomiting)   . PTSD (post-traumatic stress disorder)   . Suicidal ideation   . Urine incontinence   . Vitamin D deficiency     Past Surgical History:  Procedure Laterality Date  . ABDOMINAL HYSTERECTOMY     sept 1996  . APPENDECTOMY    . BACK SURGERY  2008  . CARDIAC CATHETERIZATION     nov 1997  . CHOLECYSTECTOMY    . COLONOSCOPY  10/17/2002    Distal proctitis, small external hemorrhoids, otherwise/  normal colonoscopy. Suspect rectal bleeding secondary to hemorrhoids  . ESOPHAGOGASTRODUODENOSCOPY  03/18/09   fundic gland polyps/mild gastritis  . HERNIA REPAIR  1978  . JOINT REPLACEMENT     bil knee replacement  . KNEE ARTHROSCOPY    . MULTIPLE EXTRACTIONS WITH ALVEOLOPLASTY N/A 08/16/2015   Procedure: EXTRACTION OF TEETH THREE, SIX, EIGHT, NINE, ELEVEN, FOURTEEN, FIFTEEN, TWENTY-EIGHT WITH ALVEOLOPLASTY;  Surgeon: Diona Browner, DDS;  Location: [facility];  Service: Oral Surgery;  Laterality: N/A;  . TONSILLECTOMY    . TOTAL VAGINAL HYSTERECTOMY    . TUBAL LIGATION     Family History:  Family History  Problem Relation Age of Onset  . Hypertension Mother   . Bipolar disorder Mother   . Dementia Mother   . Depression Mother   . Heart attack Mother   . AAA (abdominal aortic aneurysm) Mother   . Coronary artery disease Father   . Alcohol abuse Father   . Hypertension Brother   . Coronary artery disease Brother   . Bipolar disorder Brother   . Depression Brother   . Depression Sister   . Paranoid behavior Sister   . Cancer Sister        breast  . Bipolar disorder Sister   . Depression Sister   .  Hypertension Sister   . Cancer Son        thyroid  . Cancer Maternal Aunt        breast metastatized to brain  . Anesthesia problems Neg Hx   . Hypotension Neg Hx   . Malignant hyperthermia Neg Hx   . Pseudochol deficiency Neg Hx    Family Psychiatric  History:  Social History:  Social History   Substance and Sexual Activity  Alcohol Use No  . Alcohol/week: 0.0 oz     Social History   Substance and Sexual Activity  Drug Use No    Social History   Socioeconomic History  . Marital status: Single    Spouse name: Not on file  . Number of children: 2  . Years of education: Not on file  . Highest education level: Not on file  Occupational History  . Occupation: Disabled    Fish farm manager: UNEMPLOYED    Comment: back problems  Social Needs  . Financial resource strain: Not on file  . Food insecurity:    Worry: Not  on file    Inability: Not on file  . Transportation needs:    Medical: Not on file    Non-medical: Not on file  Tobacco Use  . Smoking status: Never Smoker  . Smokeless tobacco: Never Used  Substance and Sexual Activity  . Alcohol use: No    Alcohol/week: 0.0 oz  . Drug use: No  . Sexual activity: Never    Birth control/protection: Abstinence  Lifestyle  . Physical activity:    Days per week: Not on file    Minutes per session: Not on file  . Stress: Not on file  Relationships  . Social connections:    Talks on phone: Not on file    Gets together: Not on file    Attends religious service: Not on file    Active member of club or organization: Not on file    Attends meetings of clubs or organizations: Not on file    Relationship status: Not on file  Other Topics Concern  . Not on file  Social History Narrative   [name] is disabled and lives with her sister, Satia. She has another sister with whom she has lived with in the past, and who is a Designer, jewellery, but not currently practicing.   She has two grown sons that she does not see often, as they  live in other states. She has 5 grand children.   [name] has a long history of mental illness including depression, PTSD, suicidal and homicidal ideation.   She has been obese most all of her life. Her weight has significantly impacted her QOL. She recently lost 20 lbs. By decreasing portion size & increasing proteins.    Additional Social History:    Allergies:   Allergies  Allergen Reactions  . Abilify [Aripiprazole] Other (See Comments)    hallucinations  . Naproxen Nausea And Vomiting and Swelling  . Ativan [Lorazepam] Other (See Comments)    Delirium  . Haldol [Haloperidol] Other (See Comments)    Hallucinating   . Hydroxyzine Other (See Comments)    hallucinations    Labs:  Results for orders placed or performed during the hospital encounter of 09/26/17 (from the past 48 hour(s))  CBC     Status: Abnormal   Collection Time: 09/28/17  6:06 AM  Result Value Ref Range   WBC 6.1 4.0 - 10.5 K/uL   RBC 3.80 (L) 3.87 - 5.11 MIL/uL   Hemoglobin 11.9 (L) 12.0 - 15.0 g/dL   HCT 35.6 (L) 36.0 - 46.0 %   MCV 93.7 78.0 - 100.0 fL   MCH 31.3 26.0 - 34.0 pg   MCHC 33.4 30.0 - 36.0 g/dL   RDW 12.8 11.5 - 15.5 %   Platelets 71 (L) 150 - 400 K/uL    Comment: CONSISTENT WITH PREVIOUS RESULT Performed at [facility] [address] [place], [place] [address]   Comprehensive metabolic panel     Status: Abnormal   Collection Time: 09/28/17  6:06 AM  Result Value Ref Range   Sodium 139 135 - 145 mmol/L   Potassium 4.1 3.5 - 5.1 mmol/L   Chloride 104 101 - 111 mmol/L   CO2 26 22 - 32 mmol/L   Glucose, Bld 112 (H) 65 - 99 mg/dL   BUN 13 6 - 20 mg/dL   Creatinine, Ser 0.85 0.44 - 1.00 mg/dL   Calcium 9.2 8.9 - 10.3 mg/dL   Total Protein 6.2 (L) 6.5 - 8.1 g/dL  Albumin 3.2 (L) 3.5 - 5.0 g/dL   AST 17 15 - 41 U/L   ALT 9 (L) 14 - 54 U/L   Alkaline Phosphatase 50 38 - 126 U/L   Total Bilirubin 0.4 0.3 - 1.2 mg/dL   GFR calc non Af Amer >60 >60 mL/min   GFR  calc Af Amer >60 >60 mL/min    Comment: (NOTE) The eGFR has been calculated using the CKD EPI equation. This calculation has not been validated in all clinical situations. eGFR's persistently <60 mL/min signify possible Chronic Kidney Disease.    Anion gap 9 5 - 15    Comment: Performed at [facility], [place] [address], [place], [place] [address]  CBC     Status: Abnormal   Collection Time: 09/29/17  5:12 AM  Result Value Ref Range   WBC 6.6 4.0 - 10.5 K/uL    Comment: WHITE COUNT CONFIRMED ON SMEAR   RBC 3.48 (L) 3.87 - 5.11 MIL/uL   Hemoglobin 11.0 (L) 12.0 - 15.0 g/dL   HCT 33.1 (L) 36.0 - 46.0 %   MCV 95.1 78.0 - 100.0 fL   MCH 31.6 26.0 - 34.0 pg   MCHC 33.2 30.0 - 36.0 g/dL   RDW 13.5 11.5 - 15.5 %   Platelets 81 (L) 150 - 400 K/uL    Comment: CONSISTENT WITH PREVIOUS RESULT Performed at [facility] [address], [place], [place] [address]   Comprehensive metabolic panel     Status: Abnormal   Collection Time: 09/29/17  5:12 AM  Result Value Ref Range   Sodium 137 135 - 145 mmol/L   Potassium 4.1 3.5 - 5.1 mmol/L   Chloride 99 (L) 101 - 111 mmol/L   CO2 27 22 - 32 mmol/L   Glucose, Bld 102 (H) 65 - 99 mg/dL   BUN 14 6 - 20 mg/dL   Creatinine, Ser 0.96 0.44 - 1.00 mg/dL   Calcium 9.4 8.9 - 10.3 mg/dL   Total Protein 5.9 (L) 6.5 - 8.1 g/dL   Albumin 3.0 (L) 3.5 - 5.0 g/dL   AST 15 15 - 41 U/L   ALT 8 (L) 14 - 54 U/L   Alkaline Phosphatase 45 38 - 126 U/L   Total Bilirubin 0.5 0.3 - 1.2 mg/dL   GFR calc non Af Amer >60 >60 mL/min   GFR calc Af Amer >60 >60 mL/min    Comment: (NOTE) The eGFR has been calculated using the CKD EPI equation. This calculation has not been validated in all clinical situations. eGFR's persistently <60 mL/min signify possible Chronic Kidney Disease.    Anion gap 11 5 - 15    Comment: Performed at [facility], [place] [address] [place], [place] [address]    Current  Facility-Administered Medications  Medication Dose Route Frequency Provider Last Rate Last Dose  . albuterol (PROVENTIL) (2.5 MG/3ML) 0.083% nebulizer solution 2.5 mg  2.5 mg Nebulization Q4H PRN Reubin Milan, MD      . atorvastatin (LIPITOR) tablet 20 mg  20 mg Oral q1800 Reubin Milan, MD   20 mg at 09/28/17 1829  . benztropine (COGENTIN) tablet 1 mg  1 mg Oral QHS Reubin Milan, MD   1 mg at 09/28/17 2104  . cefTRIAXone (ROCEPHIN) 2 g in sodium chloride 0.9 % 100 mL IVPB  2 g Intravenous QHS Reubin Milan, MD   Stopped at 09/28/17 2309  . celecoxib (CELEBREX) capsule 200 mg  200  mg Oral BID Reubin Milan, MD   200 mg at 09/29/17 1044  . cloNIDine (CATAPRES) tablet 0.1 mg  0.1 mg Oral QHS Reubin Milan, MD   0.1 mg at 09/28/17 2104  . divalproex (DEPAKOTE) DR tablet 1,500 mg  1,500 mg Oral QHS Reubin Milan, MD   1,500 mg at 09/28/17 2104  . DULoxetine (CYMBALTA) DR capsule 60 mg  60 mg Oral Daily Reubin Milan, MD   60 mg at 09/29/17 1044  . feeding supplement (PRO-STAT SUGAR FREE 64) liquid 30 mL  30 mL Oral BID Jani Gravel, MD      . fluticasone [facility]) 50 MCG/ACT nasal spray 1 spray  1 spray Each Nare BID PRN Reubin Milan, MD      . fluticasone furoate-vilanterol (BREO ELLIPTA) 100-25 MCG/INH 1 puff  1 puff Inhalation Daily Reubin Milan, MD   1 puff at 09/28/17 1148  . lisinopril (PRINIVIL,ZESTRIL) tablet 20 mg  20 mg Oral Daily Reubin Milan, MD   20 mg at 09/29/17 1044   And  . hydrochlorothiazide (HYDRODIURIL) tablet 25 mg  25 mg Oral Daily Reubin Milan, MD   25 mg at 09/29/17 1043  . levothyroxine (SYNTHROID, LEVOTHROID) tablet 125 mcg  125 mcg Oral QAC breakfast Jani Gravel, MD   125 mcg at 09/29/17 1043  . loratadine (CLARITIN) tablet 10 mg  10 mg Oral Daily Reubin Milan, MD   10 mg at 09/29/17 1044  . pantoprazole (PROTONIX) EC tablet 40 mg  40 mg Oral Daily Reubin Milan, MD   40 mg at 09/29/17  1044  . polyethylene glycol (MIRALAX / GLYCOLAX) packet 17 g  17 g Oral BID PRN Reubin Milan, MD      . promethazine [facility]) tablet 25 mg  25 mg Oral Q8H PRN Reubin Milan, MD      . risperiDONE (RISPERDAL) tablet 4 mg  4 mg Oral QHS Reubin Milan, MD   4 mg at 09/28/17 2104  . traMADol (ULTRAM) tablet 50 mg  50 mg Oral Q8H PRN Reubin Milan, MD        Musculoskeletal: Strength & Muscle Tone: within normal limits Gait & Station: not tested Patient leans: N/A  Psychiatric Specialty Exam: Physical Exam  Psychiatric: She has a normal mood and affect. Her speech is normal and behavior is normal. Judgment and thought content normal. Cognition and memory are normal.    Review of Systems  Constitutional: Negative.   HENT: Negative.   Eyes: Negative.   Respiratory: Positive for shortness of breath.   Cardiovascular: Negative.   Gastrointestinal: Negative.   Genitourinary: Negative.   Musculoskeletal: Positive for myalgias.  Skin: Negative.   Neurological: Negative.   Endo/Heme/Allergies: Negative.   Psychiatric/Behavioral: The patient is nervous/anxious.     Blood pressure 97/60, pulse 87, temperature 98 F (36.7 C), temperature source Oral, resp. rate 18, height '5\' 9"'  (1.753 m), weight (!) 182.7 kg (402 lb 11.2 oz), last menstrual period 02/18/1995, SpO2 98 %.Body mass index is 59.47 kg/m.  General Appearance: Casual  Eye Contact:  Good  Speech:  Clear and Coherent  Volume:  Normal  Mood:  Euphoric  Affect:  Appropriate  Thought Process:  Coherent  Orientation:  Full (Time, Place, and Person)  Thought Content:  Logical  Suicidal Thoughts:  No  Homicidal Thoughts:  No  Memory:  Immediate;   Good Recent;   Fair Remote;   Good  Judgement:  Fair  Insight:  Fair  Psychomotor Activity:  Normal  Concentration:  Concentration: Fair and Attention Span: Fair  Recall:  [employer] of Knowledge:  Fair  Language:  Good  Akathisia:  No  Handed:  Right   AIMS (if indicated):     Assets:  Communication Skills Desire for Improvement  ADL's:  Intact  Cognition:  WNL  Sleep:   fair     Treatment Plan Summary: Patient with long history of mental illness, multiple medical problem who was admitted due to altered mental status, hallucinations most likely induced by  Urinary tract infection. Patient is alert , oriented and denies psychosis today.  Plan/Recommendations: -Continue Duloxetine 60 mg daily, Depakote 1500 qhs, Risperdal 45m qhs and Benztropine 128mdaily.  Disposition: No evidence of imminent risk to self or others at present.   Patient does not meet criteria for psychiatric inpatient admission. Supportive therapy provided about ongoing stressors.  Pls schedule patient [facility]  [facility].  AkCorena PilgrimMD 09/29/2017 11:55 AM

## 2017-09-30 DIAGNOSIS — K219 Gastro-esophageal reflux disease without esophagitis: Secondary | ICD-10-CM

## 2017-09-30 LAB — CBC
HEMATOCRIT: 32.9 % — AB (ref 36.0–46.0)
Hemoglobin: 11.1 g/dL — ABNORMAL LOW (ref 12.0–15.0)
MCH: 31.5 pg (ref 26.0–34.0)
MCHC: 33.7 g/dL (ref 30.0–36.0)
MCV: 93.5 fL (ref 78.0–100.0)
Platelets: 80 10*3/uL — ABNORMAL LOW (ref 150–400)
RBC: 3.52 MIL/uL — AB (ref 3.87–5.11)
RDW: 12.8 % (ref 11.5–15.5)
WBC: 6.7 10*3/uL (ref 4.0–10.5)

## 2017-09-30 LAB — COMPREHENSIVE METABOLIC PANEL
ALK PHOS: 41 U/L (ref 38–126)
ALT: 7 U/L — AB (ref 14–54)
AST: 32 U/L (ref 15–41)
Albumin: 2.9 g/dL — ABNORMAL LOW (ref 3.5–5.0)
Anion gap: 10 (ref 5–15)
BILIRUBIN TOTAL: 1.2 mg/dL (ref 0.3–1.2)
BUN: 14 mg/dL (ref 6–20)
CO2: 25 mmol/L (ref 22–32)
CREATININE: 0.92 mg/dL (ref 0.44–1.00)
Calcium: 8.9 mg/dL (ref 8.9–10.3)
Chloride: 97 mmol/L — ABNORMAL LOW (ref 101–111)
GFR calc Af Amer: >60 mL/min (ref >60)
Glucose, Bld: 95 mg/dL (ref 65–99)
Potassium: 4.4 mmol/L (ref 3.5–5.1)
Sodium: 132 mmol/L — ABNORMAL LOW (ref 135–145)
TOTAL PROTEIN: 5.9 g/dL — AB (ref 6.5–8.1)

## 2017-09-30 MED ORDER — CEPHALEXIN 500 MG PO CAPS: 500.0000 mg | ORAL_CAPSULE | Freq: Two times a day (BID) | ORAL | 0 refills | Status: AC

## 2017-09-30 MED ORDER — HYDROCHLOROTHIAZIDE 25 MG PO TABS: 25.0000 mg | ORAL_TABLET | Freq: Every day | ORAL | 0 refills | Status: DC

## 2017-09-30 MED ORDER — ATORVASTATIN CALCIUM 20 MG PO TABS: 20.0000 mg | ORAL_TABLET | Freq: Every day | ORAL | 0 refills | Status: DC

## 2017-09-30 MED ORDER — LEVOTHYROXINE SODIUM 125 MCG PO TABS: 125.0000 ug | ORAL_TABLET | Freq: Every day | ORAL | 0 refills | Status: DC

## 2017-09-30 MED ORDER — PRO-STAT SUGAR FREE PO LIQD: 30.0000 mL | Freq: Two times a day (BID) | ORAL | 0 refills | Status: DC

## 2017-09-30 NOTE — Care Management Important Message (Signed)
Important Message  Patient Details  Name: Allison Sharp MRN: [id number] Date of Birth: 1955/08/25   Medicare Important Message Given:  Yes    Erenest Rasher, RN 09/30/2017, 1:41 PM

## 2017-09-30 NOTE — Discharge Summary (Addendum)
Allison Sharp, is a 62 y.o. female  DOB 1955-09-14  MRN [id number].  Admission date:  09/26/2017  Admitting Physician  Allison Milan, MD  Discharge Date:  09/30/2017   Primary MD  Allison Sharp, Allison Kaufmann, MD  Recommendations for primary care physician for things to follow:     Principal Problem: Acute encephalopathy Unknown etiology, but likely could be UTI. Urine culture (5/3)=> Allison Sharp resistant to cipro othewise pansensitive.  Keflex 500mg  po bid x 4 days Please f/u with pcp in 1 week  Active Problems: Urinary tract infection Urine cx=> >100,000 CFU Allison Sharp, resistant to cipro Keflex as above  Thrombocytopenia  HIT panel 5/3 negative Please obtain outpatient referal to heme/ onc for evaluation  pcp to please check cbc in 1 week  Hypothyroidism (TSH 5.634, 5/2) Contlevothyroxine, increase from112 micrograms To 173micrograms po qday (5/3)   GERD Cont PPI  Coronary artery disease Continue atorvastatin and antihypertensives.  Essential hypertension, benign DC Prinzide 20-25 mg p.o. Daily. Start hydrochlorothiazide 25mg  po qday (5/5) Please f/u with pcp in 1 week for bp check  Please check cmp with pcp in 1 week Home health RN to check bp and report to Pcp please  Hypercholesterolemia Continue atorvastatin.  OSA on CPAP Continue nocturnal CPAP.  Behavioral health Continue current meds for anxiety,MDD/bipolar, PTSD  Please f/u with psychiatry for adjustment of medication in 1 week to reduce AV hallucinations  Gait instability, ADLS Home health PT/OT to help with gait instability, ADLS      Admission Diagnosis  Lower urinary tract infectious disease [N39.0] Confusion [R41.0] Auditory hallucinations [R44.0] Visual hallucinations [R44.1]   Discharge Diagnosis  Lower urinary tract infectious disease [N39.0] Confusion  [R41.0] Auditory hallucinations [R44.0] Visual hallucinations [R44.1]    Principal Problem:   Acute encephalopathy Active Problems:   Hypothyroidism   HYPERCHOLESTEROLEMIA   GERD   Coronary artery disease   OSA on CPAP   Essential hypertension, benign   Lower urinary tract infectious disease   Auditory hallucinations   Visual hallucinations   Thrombocytopenia (HCC)      Past Medical History:  Diagnosis Date  . Allergy   . Anxiety   . Arthritis   . Asthma   . Bipolar 1 disorder ([place])   . CAD (coronary artery disease)   . Cellulitis   . CHF (congestive heart failure) (HCC)    diastolic dysfunction  . Chronic back pain   . Chronic headaches   . Complication of anesthesia    States she typically gets sick s/p anesthesia  . Contusion of sacrum   . COPD (chronic obstructive pulmonary disease) ([place])   . Depression   . Dyspnea    PFT 03/05/09 FEV1 2.77(98%), FVC 3.25(86%), FEV1% 85, TLC 5.88(99%), DLCO 60% ,  Methacholine challenge 03/16/09 normal ,  CT chest 03/12/09 no pulmonary disease  . Fungal infection   . GERD (gastroesophageal reflux disease)   . History of colonoscopy 10/17/2002   by Dr Allison Sharp-> distal non-specific proctitis, small ext hemorrhoids,   .  HTN (hypertension)   . Hyperlipidemia   . Hyperthyroidism   . Hypothyroidism    States she only has hyperthyroidism  . Migraine headache   . Morbid obesity with body mass index of 50.0-59.9 in adult [facility]) JAN 2011 370 LBS   2004 311 BMI 45.9  . Myocardial infarction ([place])    NOV 1997  . OSA on CPAP    she had been on 2L O2 at night but that was stopped  . PONV (postoperative nausea and vomiting)   . PTSD (post-traumatic stress disorder)   . Suicidal ideation   . Urine incontinence   . Vitamin D deficiency     Past Surgical History:  Procedure Laterality Date  . ABDOMINAL HYSTERECTOMY     sept 1996  . APPENDECTOMY    . BACK SURGERY  2008  . CARDIAC CATHETERIZATION     nov 1997  . CHOLECYSTECTOMY     . COLONOSCOPY  10/17/2002    Distal proctitis, small external hemorrhoids, otherwise/  normal colonoscopy. Suspect rectal bleeding secondary to hemorrhoids  . ESOPHAGOGASTRODUODENOSCOPY  03/18/09   fundic gland polyps/mild gastritis  . HERNIA REPAIR  1978  . JOINT REPLACEMENT     bil knee replacement  . KNEE ARTHROSCOPY    . MULTIPLE EXTRACTIONS WITH ALVEOLOPLASTY N/A 08/16/2015   Procedure: EXTRACTION OF TEETH THREE, SIX, EIGHT, NINE, ELEVEN, FOURTEEN, FIFTEEN, TWENTY-EIGHT WITH ALVEOLOPLASTY;  Surgeon: Allison Sharp, DDS;  Location: [place];  Service: Oral Surgery;  Laterality: N/A;  . TONSILLECTOMY    . TOTAL VAGINAL HYSTERECTOMY    . TUBAL LIGATION         HPI  from the history and physical done on the day of admission:    62 y.o.femalewith medical history significant ofseasonal allergies, anxiety, depression, bipolar 1 disorder, PTSD, osteoarthritis, asthma/COPD, CAD, history of MI, chronic diastolic CHF, chronic back pain, chronic headaches, GERD, hypertension, hyperlipidemia, hypothyroidism, OSA on CPAP who isbrought by her nieceto the emergency department due toworsening mental status. The patient for the past 2 months has been having some confusion, but over the past 2 weeks her episodes of confusion havebeen increasing in frequency and intensity. This seems to be worse when the patient becomes hypoxic. The patient has been having some auditory hallucinations, but is currently not listening to them. She is oriented to self and place, but is otherwise disoriented and unable to contribute to the HPI. History is taken from her niece Allison Sharp.  ED Course:Initial vital signs temperature 98.2 F, pulse 95, respirations 18, blood pressure 124/85 mmHg and O2 sat 100% on nasal cannula oxygen.  Her work-up shows a urinalysis with cloudy urine, positive nitrites, large leukocyte esterase, few bacteria and >50 WBC per hpf. White count 7.8, hemoglobin 12.8 and platelets 108.  Chemistry shows a sodium 199 mmol/L. Glucose 131, BUN 27 creatinine 1.20 mg/dL. All other values are within normal limits.Her CT head did not show any acute intracranial abnormalities.        Hospital Course:     Pt was admitted on 5/1 for altered mental status. CT brain 5/1=> no acute process. Pt was noted to have UTI.  Pt was started on rocephin 2gm iv qday.  Her urine culture (5/1) grew Allison Sharp.  PanSensitive except for resistance to cipro.  Pt received iv NS for mild dehydration.  Her TSH =5.634 and therefore her levothyroxine was increased to 125 micrograms po qday on 5/3.  Pt has become much more alert .  She is aoxox 3.  Pt is still having hallucinations and was seen by psychiatry. Dr. Corena Pilgrim and he made no adjustments in her risperdal or depakote or other psychiatric medicine.  Pt bp on 5/4 slightly low so Prinzide was stopped and pt maintained on hydrochlorothiazide alone.  Pt appears to be doing well and per family at her baseline.  Very alert today.  Pt will be discharged to home.   Follow UP  Follow-up Information    Allison Sharp, Allison Kaufmann, MD Follow up in 1 week(s).   Specialties:  Family Medicine, Cardiology Contact information: [place] [address] [phone]        Kathlee Nations, MD Follow up in 1 week(s).   Specialty:  Psychiatry Contact information: [address] [place] [address] [phone]            Consults obtained - psychiatry  Discharge Condition: stable  Diet and Activity recommendation: See Discharge Instructions below  Discharge Instructions         Discharge Medications     Allergies as of 09/30/2017      Reactions   Abilify [aripiprazole] Other (See Comments)   hallucinations   Naproxen Nausea And Vomiting, Swelling   Ativan [lorazepam] Other (See Comments)   Delirium   Haldol [haloperidol] Other (See Comments)   Hallucinating    Hydroxyzine Other (See Comments)   hallucinations        Medication List    STOP taking these medications   hydrOXYzine 50 MG tablet Commonly known as:  ATARAX/VISTARIL   lisinopril-hydrochlorothiazide 20-25 MG tablet Commonly known as:  PRINZIDE,ZESTORETIC     TAKE these medications   albuterol (2.5 MG/3ML) 0.083% nebulizer solution Commonly known as:  PROVENTIL Take 3 mLs (2.5 mg total) by nebulization every 4 (four) hours as needed for wheezing or shortness of breath.   PROAIR HFA 108 (90 Base) MCG/ACT inhaler Generic drug:  albuterol USE 2 PUFF EVERY 4 HOURS AS NEEDED FOR WHEEZING OR SHORTNESS OF BREATH   atorvastatin 20 MG tablet Commonly known as:  LIPITOR Take 1 tablet (20 mg total) by mouth daily at 6 PM.   benztropine 1 MG tablet Commonly known as:  COGENTIN Take 1 tablet (1 mg total) by mouth at bedtime.   BREO ELLIPTA 100-25 MCG/INH Aepb Generic drug:  fluticasone furoate-vilanterol INHALE 1 PUFF INTO THE LUNGS DAILY What changed:    when to take this  reasons to take this   celecoxib 200 MG capsule Commonly known as:  CELEBREX Take 1 capsule (200 mg total) by mouth 2 (two) times daily.   cephALEXin 500 MG capsule Commonly known as:  KEFLEX Take 1 capsule (500 mg total) by mouth 2 (two) times daily for 4 days. What changed:  when to take this   cetirizine 10 MG tablet Commonly known as:  ZYRTEC TAKE 1 TABLET ONCE DAILY What changed:  Another medication with the same name was removed. Continue taking this medication, and follow the directions you see here.   cloNIDine 0.1 MG tablet Commonly known as:  CATAPRES Take 0.1 mg by mouth at bedtime.   diclofenac sodium 1 % Gel Commonly known as:  VOLTAREN Apply to affected area twice daily as needed What changed:    how much to take  how to take this  when to take this  reasons to take this  additional instructions   divalproex 500 MG DR tablet Commonly known as:  DEPAKOTE Take 3 tablets (1,500 mg total) by mouth at bedtime.  DULoxetine 60 MG  capsule Commonly known as:  CYMBALTA Take 1 capsule (60 mg total) by mouth daily.   feeding supplement (PRO-STAT SUGAR FREE 64) Liqd Take 30 mLs by mouth 2 (two) times daily.   fluticasone 50 MCG/ACT nasal spray Commonly known as:  FLONASE Place 1 spray 2 (two) times daily as needed into both nostrils for allergies or rhinitis.   hydrochlorothiazide 25 MG tablet Commonly known as:  HYDRODIURIL Take 1 tablet (25 mg total) by mouth daily. Start taking on:  10/01/2017   levothyroxine 125 MCG tablet Commonly known as:  SYNTHROID, LEVOTHROID Take 1 tablet (125 mcg total) by mouth daily before breakfast. Start taking on:  10/01/2017 What changed:    medication strength  See the new instructions.   nystatin cream Commonly known as:  MYCOSTATIN APPLY TO THE AFFECTED AREA THREE TIMES DAILY AS NEEDED .Marland KitchenFOR TOPICAL USE ONLY...   omeprazole 20 MG capsule Commonly known as:  PRILOSEC TAKE (1) CAPSULE DAILY   ondansetron 4 MG disintegrating tablet Commonly known as:  ZOFRAN ODT Take 1 tablet (4 mg total) by mouth every 8 (eight) hours as needed for nausea or vomiting.   polyethylene glycol powder powder Commonly known as:  GLYCOLAX/MIRALAX Take 17 g by mouth 2 (two) times daily as needed. What changed:  reasons to take this   promethazine 25 MG tablet Commonly known as:  PHENERGAN Take 1 tablet (25 mg total) by mouth every 8 (eight) hours as needed for nausea or vomiting.   risperidone 4 MG tablet Commonly known as:  RISPERDAL Take 1 tablet (4 mg total) by mouth at bedtime.   traMADol 50 MG tablet Commonly known as:  ULTRAM Take 1 tablet (50 mg total) by mouth every 8 (eight) hours as needed for moderate pain.       Major procedures and Radiology Reports - PLEASE review detailed and final reports for all details, in brief -       Ct Head Wo Contrast  Result Date: 09/26/2017 CLINICAL DATA:  Altered mental status. Increasing confusion over the last 2 weeks. Hallucinations.  EXAM: CT HEAD WITHOUT CONTRAST TECHNIQUE: Contiguous axial images were obtained from the base of the skull through the vertex without intravenous contrast. COMPARISON:  10/21/2009 FINDINGS: Brain: No evidence of acute infarction, hemorrhage, hydrocephalus, extra-axial collection or mass lesion/mass effect. Mild cerebral atrophy. Vascular: No hyperdense vessel or unexpected calcification. Skull: Normal. Negative for fracture or focal lesion. Sinuses/Orbits: No acute finding. Other: None. IMPRESSION: No acute intracranial abnormalities.  Mild cerebral atrophy. Electronically Signed   By: Lucienne Capers M.D.   On: 09/26/2017 23:32    Micro Results      Recent Results (from the past 240 hour(s))  Culture, Urine     Status: Abnormal   Collection Time: 09/26/17 11:19 PM  Result Value Ref Range Status   Specimen Description   Final    URINE, CLEAN CATCH Performed at [facility], [place] [address] [place], [place] [address]    Special Requests   Final    NONE Performed at [facility], [place] [address] [place], [place] [address]    Culture >=100,000 COLONIES/mL ESCHERICHIA Sharp (A)  Final   Report Status 09/29/2017 FINAL  Final   Organism ID, Bacteria ESCHERICHIA Sharp (A)  Final      Susceptibility   Escherichia Sharp - MIC*    AMPICILLIN <=2 SENSITIVE Sensitive     CEFAZOLIN <=4 SENSITIVE Sensitive     CEFTRIAXONE <=1 SENSITIVE  Sensitive     CIPROFLOXACIN >=4 RESISTANT Resistant     GENTAMICIN <=1 SENSITIVE Sensitive     IMIPENEM <=0.25 SENSITIVE Sensitive     NITROFURANTOIN <=16 SENSITIVE Sensitive     TRIMETH/SULFA <=20 SENSITIVE Sensitive     AMPICILLIN/SULBACTAM <=2 SENSITIVE Sensitive     PIP/TAZO <=4 SENSITIVE Sensitive     Extended ESBL NEGATIVE Sensitive     * >=100,000 COLONIES/mL ESCHERICHIA Sharp       Today   Subjective    Allison Sharp today is axox3,  Pt is very alerty.  Pt denies dysuria, hematuria, fever, chills, n/v, flank  pain  pt has no headache,no chest, no  abdominal pain,no new weakness tingling or numbness, feels much better wants to go home today.    Objective   Blood pressure 113/68, pulse 72, temperature 97.8 F (36.6 C), temperature source Oral, resp. rate 18, height 5\' 9"  (1.753 m), weight (!) 182.7 kg (402 lb 11.2 oz), last menstrual period 02/18/1995, SpO2 95 %.   Intake/Output Summary (Last 24 hours) at 09/30/2017 1210 Last data filed at 09/30/2017 1100 Gross per 24 hour  Intake 680 ml  Output 1800 ml  Net -1120 ml    Exam Awake Alert, Oriented x 3, No new F.N deficits, Normal affect [place].AT,PERRAL Supple Neck,No JVD, No cervical lymphadenopathy appriciated.  Symmetrical Chest wall movement, Good air movement bilaterally, CTAB RRR,No Gallops,Rubs or new Murmurs, No Parasternal Heave +ve B.Sounds, Abd Soft, Non tender, No organomegaly appriciated, No rebound -guarding or rigidity. No Cyanosis, Clubbing or edema, No new Rash or bruise Morbid obeisity Gait instability, walks with walker, needs home health PT to help reduce risk of falls    Data Review   CBC w Diff:  Lab Results  Component Value Date   WBC 6.7 09/30/2017   HGB 11.1 (L) 09/30/2017   HGB 13.2 07/02/2017   HGB 14.8 01/04/2011   HCT 32.9 (L) 09/30/2017   HCT 38.4 07/02/2017   HCT 42.6 01/04/2011   PLT 80 (L) 09/30/2017   PLT 141 (L) 07/02/2017   LYMPHOPCT 20 09/27/2017   LYMPHOPCT 39.8 01/04/2011   MONOPCT 11 09/27/2017   MONOPCT 8.1 01/04/2011   EOSPCT 1 09/27/2017   EOSPCT 3.4 01/04/2011   BASOPCT 0 09/27/2017   BASOPCT 0.3 01/04/2011    CMP:  Lab Results  Component Value Date   NA 132 (L) 09/30/2017   NA 140 05/04/2017   K 4.4 09/30/2017   CL 97 (L) 09/30/2017   CO2 25 09/30/2017   BUN 14 09/30/2017   BUN 15 05/04/2017   CREATININE 0.92 09/30/2017   CREATININE 0.83 12/03/2015   PROT 5.9 (L) 09/30/2017   PROT 6.6 05/04/2017   ALBUMIN 2.9 (L) 09/30/2017   ALBUMIN 4.1 05/04/2017   BILITOT 1.2  09/30/2017   BILITOT 0.4 05/04/2017   ALKPHOS 41 09/30/2017   AST 32 09/30/2017   ALT 7 (L) 09/30/2017  .   Total Time in preparing paper work, data evaluation and todays exam - 50 minutes  Jani Gravel M.D on 09/30/2017 at 12:10 PM  [facility]   Office  [phone]

## 2017-09-30 NOTE — Care Management Note (Addendum)
Case Management Note  Patient Details  Name: Allison Sharp MRN: [id number] Date of Birth: 06-May-1956  Subjective/Objective:  Acute encephalopathy                  Action/Plan: NCM spoke to pt and offered choice for HH/list provided. Pt requesting [facility] for HH. She has all her DME with [facility]. She has oxygen, CPAP, RW with seat, RW, cane, and hospital bed. Contacted [facility] with new referral.  09/30/17 Received call back from [facility] and they are unable to accept referral.    Expected Discharge Date:  09/30/17               Expected Discharge Plan:  [place]  In-House Referral:  NA  Discharge planning Services  CM Consult  Post Acute Care Choice:  Home Health, Resumption of Svcs/PTA Provider Choice offered to:  Patient  DME Arranged:  N/A DME Agency:  NA  HH Arranged:  RN, PT, OT HH Agency:  [facility]  Status of Service:  Completed, signed off  If discussed at [place] of Stay Meetings, dates discussed:    Additional Comments:  Erenest Rasher, RN 09/30/2017, 1:41 PM

## 2017-10-02 ENCOUNTER — Telehealth: Payer: Self-pay | Admitting: Family Medicine

## 2017-10-03 NOTE — Telephone Encounter (Signed)
Spoke with patient's daughter, patient was taken off of blood pressure medication while in hospital, unsure if they should restart.  I recommended she be seen for a hospital follow up.  Appointment scheduled for 10/08/17.

## 2017-10-03 NOTE — Telephone Encounter (Signed)
lmtcb

## 2017-10-04 ENCOUNTER — Other Ambulatory Visit: Payer: Self-pay | Admitting: Physician Assistant

## 2017-10-04 ENCOUNTER — Other Ambulatory Visit (HOSPITAL_COMMUNITY): Payer: Self-pay

## 2017-10-04 DIAGNOSIS — G4733 Obstructive sleep apnea (adult) (pediatric): Secondary | ICD-10-CM | POA: Diagnosis not present

## 2017-10-04 MED ORDER — RISPERIDONE 4 MG PO TABS: 4.0000 mg | ORAL_TABLET | Freq: Every day | ORAL | 0 refills | Status: DC

## 2017-10-04 NOTE — Telephone Encounter (Signed)
If patient wants a refill when she needs to be seen in the office

## 2017-10-05 NOTE — Telephone Encounter (Signed)
Lmtcb-cb 5/10

## 2017-10-05 NOTE — Telephone Encounter (Signed)
Aware she will need to be seen for refill

## 2017-10-07 DIAGNOSIS — G4733 Obstructive sleep apnea (adult) (pediatric): Secondary | ICD-10-CM | POA: Diagnosis not present

## 2017-10-08 ENCOUNTER — Ambulatory Visit (INDEPENDENT_AMBULATORY_CARE_PROVIDER_SITE_OTHER): Payer: Medicare Other | Admitting: Family Medicine

## 2017-10-08 ENCOUNTER — Encounter: Payer: Self-pay | Admitting: Family Medicine

## 2017-10-08 VITALS — BP 127/80 | HR 97 | Temp 97.4°F | Ht 69.0 in | Wt 399.0 lb

## 2017-10-08 DIAGNOSIS — S0031XA Abrasion of nose, initial encounter: Secondary | ICD-10-CM

## 2017-10-08 DIAGNOSIS — Z09 Encounter for follow-up examination after completed treatment for conditions other than malignant neoplasm: Secondary | ICD-10-CM | POA: Diagnosis not present

## 2017-10-08 DIAGNOSIS — R300 Dysuria: Secondary | ICD-10-CM | POA: Diagnosis not present

## 2017-10-08 DIAGNOSIS — T148XXA Other injury of unspecified body region, initial encounter: Secondary | ICD-10-CM

## 2017-10-08 LAB — URINALYSIS, COMPLETE
Bilirubin, UA: NEGATIVE
Glucose, UA: NEGATIVE
Nitrite, UA: NEGATIVE
PH UA: 6 (ref 5.0–7.5)
RBC UA: NEGATIVE
Specific Gravity, UA: 1.02 (ref 1.005–1.030)
Urobilinogen, Ur: 1 mg/dL (ref 0.2–1.0)

## 2017-10-08 LAB — MICROSCOPIC EXAMINATION
Bacteria, UA: NONE SEEN
RBC, UA: NONE SEEN /hpf (ref 0–2)

## 2017-10-08 NOTE — Progress Notes (Signed)
BP 127/80   Pulse 97   Temp (!) 97.4 F (36.3 C) (Oral)   Ht '5\' 9"'  (1.753 m)   Wt (!) 399 lb (181 kg)   LMP 02/18/1995   BMI 58.92 kg/m    Subjective:    Patient ID: Allison Sharp, female    DOB: [birth year], 62 y.o.   MRN: [id number]  HPI: Allison Sharp is a 62 y.o. female presenting on 10/08/2017 for Hospitalization Follow-up   HPI Hospital follow-up for dysuria and UTI and confusion Patient is coming in for UTI and hospital follow up, she was in the hospital for confusion and delirium and dehydration and is coming in today for hospital follow up. She says that she is feeling much better and her symptoms are resolved. She was admitted on 09/26/17 and discharged on 09/30/17. Patient also saw psychiatry while in the hospital to see if it was psychiatric in nature.  Patient has an abrasion on her nose from her cpap, she is wondering what she can do for this.  Relevant past medical, surgical, family and social history reviewed and updated as indicated. Interim medical history since our last visit reviewed. Allergies and medications reviewed and updated.  Review of Systems  Constitutional: Negative for chills and fever.  HENT: Negative for congestion, ear discharge and ear pain.   Eyes: Negative for redness and visual disturbance.  Respiratory: Negative for chest tightness and shortness of breath.   Cardiovascular: Negative for chest pain and leg swelling.  Genitourinary: Negative for difficulty urinating, dysuria, frequency, hematuria and urgency.  Musculoskeletal: Negative for back pain and gait problem.  Skin: Positive for wound. Negative for rash.  Neurological: Negative for light-headedness and headaches.  Psychiatric/Behavioral: Negative for agitation, behavioral problems, confusion, dysphoric mood, self-injury, sleep disturbance and suicidal ideas.  All other systems reviewed and are negative.   Per HPI unless specifically indicated above   Allergies as of 10/08/2017        Reactions   Abilify [aripiprazole] Other (See Comments)   hallucinations   Naproxen Nausea And Vomiting, Swelling   Ativan [lorazepam] Other (See Comments)   Delirium   Haldol [haloperidol] Other (See Comments)   Hallucinating    Hydroxyzine Other (See Comments)   hallucinations      Medication List        Accurate as of 10/08/17  3:52 PM. Always use your most recent med list.          albuterol (2.5 MG/3ML) 0.083% nebulizer solution Commonly known as:  PROVENTIL Take 3 mLs (2.5 mg total) by nebulization every 4 (four) hours as needed for wheezing or shortness of breath.   PROAIR HFA 108 (90 Base) MCG/ACT inhaler Generic drug:  albuterol USE 2 PUFF EVERY 4 HOURS AS NEEDED FOR WHEEZING OR SHORTNESS OF BREATH   atorvastatin 20 MG tablet Commonly known as:  LIPITOR Take 1 tablet (20 mg total) by mouth daily at 6 PM.   benztropine 1 MG tablet Commonly known as:  COGENTIN Take 1 tablet (1 mg total) by mouth at bedtime.   BREO ELLIPTA 100-25 MCG/INH Aepb Generic drug:  fluticasone furoate-vilanterol INHALE 1 PUFF INTO THE LUNGS DAILY   celecoxib 200 MG capsule Commonly known as:  CELEBREX Take 1 capsule (200 mg total) by mouth 2 (two) times daily.   cetirizine 10 MG tablet Commonly known as:  ZYRTEC TAKE 1 TABLET ONCE DAILY   cloNIDine 0.1 MG tablet Commonly known as:  CATAPRES Take 0.1 mg by mouth at bedtime.  diclofenac sodium 1 % Gel Commonly known as:  VOLTAREN Apply to affected area twice daily as needed   divalproex 500 MG DR tablet Commonly known as:  DEPAKOTE Take 3 tablets (1,500 mg total) by mouth at bedtime.   DULoxetine 60 MG capsule Commonly known as:  CYMBALTA Take 1 capsule (60 mg total) by mouth daily.   feeding supplement (PRO-STAT SUGAR FREE 64) Liqd Take 30 mLs by mouth 2 (two) times daily.   fluticasone 50 MCG/ACT nasal spray Commonly known as:  FLONASE Place 1 spray 2 (two) times daily as needed into both nostrils for allergies  or rhinitis.   hydrochlorothiazide 25 MG tablet Commonly known as:  HYDRODIURIL Take 1 tablet (25 mg total) by mouth daily.   levothyroxine 125 MCG tablet Commonly known as:  SYNTHROID, LEVOTHROID Take 1 tablet (125 mcg total) by mouth daily before breakfast.   lisinopril-hydrochlorothiazide 20-25 MG tablet Commonly known as:  PRINZIDE,ZESTORETIC Take 1 tablet by mouth daily.   nystatin cream Commonly known as:  MYCOSTATIN APPLY TO THE AFFECTED AREA THREE TIMES DAILY AS NEEDED .Marland KitchenFOR TOPICAL USE ONLY...   omeprazole 20 MG capsule Commonly known as:  PRILOSEC TAKE (1) CAPSULE DAILY   ondansetron 4 MG disintegrating tablet Commonly known as:  ZOFRAN ODT Take 1 tablet (4 mg total) by mouth every 8 (eight) hours as needed for nausea or vomiting.   polyethylene glycol powder powder Commonly known as:  GLYCOLAX/MIRALAX Take 17 g by mouth 2 (two) times daily as needed.   promethazine 25 MG tablet Commonly known as:  PHENERGAN Take 1 tablet (25 mg total) by mouth every 8 (eight) hours as needed for nausea or vomiting.   risperidone 4 MG tablet Commonly known as:  RISPERDAL Take 1 tablet (4 mg total) by mouth at bedtime.   traMADol 50 MG tablet Commonly known as:  ULTRAM Take 1 tablet (50 mg total) by mouth every 8 (eight) hours as needed for moderate pain.          Objective:    BP 127/80   Pulse 97   Temp (!) 97.4 F (36.3 C) (Oral)   Ht '5\' 9"'  (1.753 m)   Wt (!) 399 lb (181 kg)   LMP 02/18/1995   BMI 58.92 kg/m   Wt Readings from Last 3 Encounters:  10/08/17 (!) 399 lb (181 kg)  09/27/17 (!) 402 lb 11.2 oz (182.7 kg)  08/13/17 (!) 393 lb (178.3 kg)    Physical Exam  Constitutional: She is oriented to person, place, and time. She appears well-developed and well-nourished. No distress.  Eyes: Pupils are equal, round, and reactive to light. Conjunctivae and EOM are normal.  Cardiovascular: Normal rate, regular rhythm, normal heart sounds and intact distal pulses.   No murmur heard. Pulmonary/Chest: Effort normal and breath sounds normal. No respiratory distress. She has no wheezes.  Musculoskeletal: Normal range of motion. She exhibits no edema or tenderness.  Neurological: She is alert and oriented to person, place, and time. Coordination normal.  Skin: Skin is warm and dry. Abrasion (small abrasion on nasal bridge) noted. No rash noted. She is not diaphoretic.  Psychiatric: She has a normal mood and affect. Her behavior is normal.  Nursing note and vitals reviewed.      Assessment & Plan:   Problem List Items Addressed This Visit    None    Visit Diagnoses    Dysuria    -  Primary   Relevant Orders   CBC with Differential/Platelet (Completed)  BMP8+EGFR (Completed)   Urine Culture (Completed)   Urinalysis, Complete (Completed)   Hospital discharge follow-up       Relevant Orders   CBC with Differential/Platelet (Completed)   BMP8+EGFR (Completed)   Abrasion of skin       Small abrasion on nasal bridge from where CPAP mask rubs against her face, recommended CPAP face pad or loosening CPAP      For nasal sore, recommended continue triple antibiotic and to purchase a nasal bridge pillow to help.  Follow up plan: Return if symptoms worsen or fail to improve.  Counseling provided for all of the vaccine components Orders Placed This Encounter  Procedures  . Urine Culture  . CBC with Differential/Platelet  . BMP8+EGFR  . Urinalysis, Complete    [name], MD [facility] 10/08/2017, 3:52 PM

## 2017-10-09 DIAGNOSIS — G4733 Obstructive sleep apnea (adult) (pediatric): Secondary | ICD-10-CM | POA: Diagnosis not present

## 2017-10-09 LAB — URINE CULTURE

## 2017-10-09 LAB — BMP8+EGFR
BUN/Creatinine Ratio: 16 (ref 12–28)
BUN: 17 mg/dL (ref 8–27)
CALCIUM: 9.6 mg/dL (ref 8.7–10.3)
CHLORIDE: 95 mmol/L — AB (ref 96–106)
CO2: 24 mmol/L (ref 20–29)
Creatinine, Ser: 1.09 mg/dL — ABNORMAL HIGH (ref 0.57–1.00)
GFR calc non Af Amer: 54 mL/min/{1.73_m2} — ABNORMAL LOW (ref >59)
GFR, EST AFRICAN AMERICAN: 62 mL/min/{1.73_m2} (ref >59)
GLUCOSE: 103 mg/dL — AB (ref 65–99)
Potassium: 4.8 mmol/L (ref 3.5–5.2)
Sodium: 135 mmol/L (ref 134–144)

## 2017-10-09 LAB — CBC WITH DIFFERENTIAL/PLATELET
BASOS ABS: 0 10*3/uL (ref 0.0–0.2)
BASOS: 0 %
EOS (ABSOLUTE): 0.2 10*3/uL (ref 0.0–0.4)
Eos: 2 %
Hematocrit: 35.7 % (ref 34.0–46.6)
Hemoglobin: 12.2 g/dL (ref 11.1–15.9)
IMMATURE GRANULOCYTES: 1 %
Immature Grans (Abs): 0.1 10*3/uL (ref 0.0–0.1)
LYMPHS: 27 %
Lymphocytes Absolute: 2.7 10*3/uL (ref 0.7–3.1)
MCH: 31.3 pg (ref 26.6–33.0)
MCHC: 34.2 g/dL (ref 31.5–35.7)
MCV: 92 fL (ref 79–97)
MONOS ABS: 0.9 10*3/uL (ref 0.1–0.9)
Monocytes: 9 %
NEUTROS PCT: 61 %
Neutrophils Absolute: 6.2 10*3/uL (ref 1.4–7.0)
PLATELETS: 145 10*3/uL — AB (ref 150–379)
RBC: 3.9 x10E6/uL (ref 3.77–5.28)
RDW: 14.5 % (ref 12.3–15.4)
WBC: 10 10*3/uL (ref 3.4–10.8)

## 2017-10-10 DIAGNOSIS — R5381 Other malaise: Secondary | ICD-10-CM | POA: Diagnosis not present

## 2017-10-10 DIAGNOSIS — R531 Weakness: Secondary | ICD-10-CM | POA: Diagnosis not present

## 2017-10-10 DIAGNOSIS — Z9181 History of falling: Secondary | ICD-10-CM | POA: Diagnosis not present

## 2017-10-11 ENCOUNTER — Other Ambulatory Visit: Payer: Self-pay | Admitting: Physician Assistant

## 2017-10-11 ENCOUNTER — Other Ambulatory Visit: Payer: Self-pay | Admitting: Family Medicine

## 2017-10-11 ENCOUNTER — Ambulatory Visit (HOSPITAL_COMMUNITY): Payer: Self-pay | Admitting: Psychiatry

## 2017-10-11 ENCOUNTER — Other Ambulatory Visit (HOSPITAL_COMMUNITY): Payer: Self-pay | Admitting: Psychiatry

## 2017-10-11 ENCOUNTER — Other Ambulatory Visit (HOSPITAL_COMMUNITY): Payer: Self-pay

## 2017-10-11 MED ORDER — BENZTROPINE MESYLATE 1 MG PO TABS: 1.0000 mg | ORAL_TABLET | Freq: Every day | ORAL | 0 refills | Status: DC

## 2017-10-11 NOTE — Telephone Encounter (Addendum)
Pt aware refill has to be done during an office visit specific to refill of controlled medication. Will call back to make appointment closer to time when needed.

## 2017-10-21 ENCOUNTER — Emergency Department (HOSPITAL_COMMUNITY): Payer: Medicare Other

## 2017-10-21 ENCOUNTER — Other Ambulatory Visit: Payer: Self-pay

## 2017-10-21 ENCOUNTER — Inpatient Hospital Stay (HOSPITAL_COMMUNITY)
Admission: EM | Admit: 2017-10-21 | Discharge: 2017-10-24 | DRG: 690 | Disposition: A | Discharge status: Home / Self Care | Payer: Medicare Other | Attending: Internal Medicine | Admitting: Internal Medicine

## 2017-10-21 ENCOUNTER — Encounter (HOSPITAL_COMMUNITY): Payer: Self-pay | Admitting: Emergency Medicine

## 2017-10-21 DIAGNOSIS — N390 Urinary tract infection, site not specified: Secondary | ICD-10-CM | POA: Diagnosis present

## 2017-10-21 DIAGNOSIS — F315 Bipolar disorder, current episode depressed, severe, with psychotic features: Secondary | ICD-10-CM | POA: Diagnosis present

## 2017-10-21 DIAGNOSIS — I10 Essential (primary) hypertension: Secondary | ICD-10-CM | POA: Diagnosis present

## 2017-10-21 DIAGNOSIS — S199XXA Unspecified injury of neck, initial encounter: Secondary | ICD-10-CM | POA: Diagnosis not present

## 2017-10-21 DIAGNOSIS — G9349 Other encephalopathy: Secondary | ICD-10-CM | POA: Diagnosis not present

## 2017-10-21 DIAGNOSIS — J449 Chronic obstructive pulmonary disease, unspecified: Secondary | ICD-10-CM | POA: Diagnosis present

## 2017-10-21 DIAGNOSIS — Z96653 Presence of artificial knee joint, bilateral: Secondary | ICD-10-CM | POA: Diagnosis not present

## 2017-10-21 DIAGNOSIS — Z7951 Long term (current) use of inhaled steroids: Secondary | ICD-10-CM | POA: Diagnosis not present

## 2017-10-21 DIAGNOSIS — I951 Orthostatic hypotension: Secondary | ICD-10-CM | POA: Diagnosis present

## 2017-10-21 DIAGNOSIS — I252 Old myocardial infarction: Secondary | ICD-10-CM | POA: Diagnosis not present

## 2017-10-21 DIAGNOSIS — G4733 Obstructive sleep apnea (adult) (pediatric): Secondary | ICD-10-CM | POA: Diagnosis not present

## 2017-10-21 DIAGNOSIS — S0990XA Unspecified injury of head, initial encounter: Secondary | ICD-10-CM | POA: Diagnosis not present

## 2017-10-21 DIAGNOSIS — W19XXXA Unspecified fall, initial encounter: Secondary | ICD-10-CM

## 2017-10-21 DIAGNOSIS — K760 Fatty (change of) liver, not elsewhere classified: Secondary | ICD-10-CM | POA: Diagnosis not present

## 2017-10-21 DIAGNOSIS — E860 Dehydration: Secondary | ICD-10-CM | POA: Diagnosis not present

## 2017-10-21 DIAGNOSIS — R440 Auditory hallucinations: Secondary | ICD-10-CM | POA: Diagnosis present

## 2017-10-21 DIAGNOSIS — E785 Hyperlipidemia, unspecified: Secondary | ICD-10-CM | POA: Diagnosis not present

## 2017-10-21 DIAGNOSIS — K219 Gastro-esophageal reflux disease without esophagitis: Secondary | ICD-10-CM | POA: Diagnosis present

## 2017-10-21 DIAGNOSIS — I2510 Atherosclerotic heart disease of native coronary artery without angina pectoris: Secondary | ICD-10-CM | POA: Diagnosis not present

## 2017-10-21 DIAGNOSIS — I5032 Chronic diastolic (congestive) heart failure: Secondary | ICD-10-CM | POA: Diagnosis not present

## 2017-10-21 DIAGNOSIS — Z79899 Other long term (current) drug therapy: Secondary | ICD-10-CM | POA: Diagnosis not present

## 2017-10-21 DIAGNOSIS — I110 Hypertensive heart disease with heart failure: Secondary | ICD-10-CM | POA: Diagnosis present

## 2017-10-21 DIAGNOSIS — F4310 Post-traumatic stress disorder, unspecified: Secondary | ICD-10-CM | POA: Diagnosis present

## 2017-10-21 DIAGNOSIS — R32 Unspecified urinary incontinence: Secondary | ICD-10-CM | POA: Diagnosis present

## 2017-10-21 DIAGNOSIS — N10 Acute tubulo-interstitial nephritis: Secondary | ICD-10-CM | POA: Diagnosis not present

## 2017-10-21 DIAGNOSIS — E039 Hypothyroidism, unspecified: Secondary | ICD-10-CM | POA: Diagnosis present

## 2017-10-21 DIAGNOSIS — R319 Hematuria, unspecified: Secondary | ICD-10-CM

## 2017-10-21 DIAGNOSIS — F419 Anxiety disorder, unspecified: Secondary | ICD-10-CM | POA: Diagnosis present

## 2017-10-21 DIAGNOSIS — Z6841 Body Mass Index (BMI) 40.0 and over, adult: Secondary | ICD-10-CM

## 2017-10-21 LAB — URINALYSIS, ROUTINE W REFLEX MICROSCOPIC
BILIRUBIN URINE: NEGATIVE
Glucose, UA: NEGATIVE mg/dL
HGB URINE DIPSTICK: NEGATIVE
Ketones, ur: 5 mg/dL — AB
Nitrite: NEGATIVE
Protein, ur: NEGATIVE mg/dL
Specific Gravity, Urine: 1.015 (ref 1.005–1.030)
pH: 6 (ref 5.0–8.0)

## 2017-10-21 MED ORDER — SODIUM CHLORIDE 0.9 % IV SOLN
1.0000 g | Freq: Once | INTRAVENOUS | Status: AC
  Administered 2017-10-22: 1 g via INTRAVENOUS
  Filled 2017-10-21: qty 10

## 2017-10-21 MED ORDER — SODIUM CHLORIDE 0.9 % IV BOLUS
500.0000 mL | Freq: Once | INTRAVENOUS | Status: AC
  Administered 2017-10-22: 500 mL via INTRAVENOUS

## 2017-10-21 MED ORDER — SODIUM CHLORIDE 0.9 % IV SOLN
INTRAVENOUS | Status: DC
  Administered 2017-10-22 – 2017-10-23 (×2): via INTRAVENOUS

## 2017-10-21 NOTE — ED Provider Notes (Signed)
[facility] DEPT Provider Note   CSN: [id number] Arrival date & time: 10/21/17  2210     History   Chief Complaint Chief Complaint  Patient presents with  . Fall  . Head Injury    HPI Allison Sharp is a 62 y.o. female.  Patient from home after falling striking the back of her head on a table.  States she tripped over her oxygen tubing and fell backwards.  She did not lose consciousness.  She does not take any blood thinners.  She complains of pain to her posterior head, neck and upper back.   states her shortness of breath is somewhat worse than baseline.  Did have episode of chest pain earlier in the day that has since resolved.  She is uncertain how long it lasted.  Niece at bedside states patient is having more hallucinations which she had last time when she had a UTI but she completed her antibiotics for this.  Patient denies any suicidal homicidal thoughts.  She denies any chest pain, abdominal pain, nausea or vomiting.  No focal weakness, numbness or tingling.  She normally walks with a walker.  The history is provided by the patient and a relative.  Fall  Associated symptoms include headaches and shortness of breath. Pertinent negatives include no chest pain and no abdominal pain.  Head Injury   Associated symptoms include weakness. Pertinent negatives include no numbness and no vomiting.    Past Medical History:  Diagnosis Date  . Allergy   . Anxiety   . Arthritis   . Asthma   . Bipolar 1 disorder ([place])   . CAD (coronary artery disease)   . Cellulitis   . CHF (congestive heart failure) (HCC)    diastolic dysfunction  . Chronic back pain   . Chronic headaches   . Complication of anesthesia    States she typically gets sick s/p anesthesia  . Contusion of sacrum   . COPD (chronic obstructive pulmonary disease) ([place])   . Depression   . Dyspnea    PFT 03/05/09 FEV1 2.77(98%), FVC 3.25(86%), FEV1% 85, TLC 5.88(99%), DLCO 60% ,   Methacholine challenge 03/16/09 normal ,  CT chest 03/12/09 no pulmonary disease  . Fungal infection   . GERD (gastroesophageal reflux disease)   . History of colonoscopy 10/17/2002   by Dr Rehman-> distal non-specific proctitis, small ext hemorrhoids,   . HTN (hypertension)   . Hyperlipidemia   . Hyperthyroidism   . Hypothyroidism    States she only has hyperthyroidism  . Migraine headache   . Morbid obesity with body mass index of 50.0-59.9 in adult [facility]) JAN 2011 370 LBS   2004 311 BMI 45.9  . Myocardial infarction ([place])    NOV 1997  . OSA on CPAP    she had been on 2L O2 at night but that was stopped  . PONV (postoperative nausea and vomiting)   . PTSD (post-traumatic stress disorder)   . Suicidal ideation   . Urine incontinence   . Vitamin D deficiency     Patient Active Problem List   Diagnosis Date Noted  . Thrombocytopenia ([place]) 09/29/2017  . Visual hallucinations   . Auditory hallucinations   . Acute encephalopathy 09/26/2017  . Lower urinary tract infectious disease 09/26/2017  . Bipolar I disorder, most recent episode depressed, severe w psychosis ([place]) 06/21/2016  . History of MI (myocardial infarction) 06/21/2016  . Spinal stenosis of lumbar region with neurogenic claudication 03/29/2016  . Severe  obesity (BMI >= 40) ([place]) 07/05/2015  . Hyperlipidemia LDL goal <130 07/05/2015  . MDD (major depressive disorder), single episode, severe with psychotic features ([place]) 01/21/2015  . 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 Bilateral knee pain 01/07/2014  . Scar condition and fibrosis of skin 12/11/2013  . Essential hypertension, benign 07/01/2013  . Asthma with acute exacerbation 11/04/2012  . OSA on CPAP 11/04/2012  . Back pain, chronic 08/27/2012  . Vitamin D insufficiency 04/04/2012  . Insomnia due to mental disorder 04/04/2012  . Anxiety 04/04/2012  . OCD (obsessive compulsive disorder) 04/04/2012  . PTSD (post-traumatic stress disorder) 06/28/2011  .  Coronary artery disease 08/26/2010  . FATTY LIVER DISEASE 03/29/2009  . GERD 03/01/2009  . Morbid obesity ([place]) 02/15/2009  . Hypothyroidism 02/12/2009  . HYPERCHOLESTEROLEMIA 02/12/2009    Past Surgical History:  Procedure Laterality Date  . ABDOMINAL HYSTERECTOMY     sept 1996  . APPENDECTOMY    . BACK SURGERY  2008  . CARDIAC CATHETERIZATION     nov 1997  . CHOLECYSTECTOMY    . COLONOSCOPY  10/17/2002    Distal proctitis, small external hemorrhoids, otherwise/  normal colonoscopy. Suspect rectal bleeding secondary to hemorrhoids  . ESOPHAGOGASTRODUODENOSCOPY  03/18/09   fundic gland polyps/mild gastritis  . HERNIA REPAIR  1978  . JOINT REPLACEMENT     bil knee replacement  . KNEE ARTHROSCOPY    . MULTIPLE EXTRACTIONS WITH ALVEOLOPLASTY N/A 08/16/2015   Procedure: EXTRACTION OF TEETH THREE, SIX, EIGHT, NINE, ELEVEN, FOURTEEN, FIFTEEN, TWENTY-EIGHT WITH ALVEOLOPLASTY;  Surgeon: Diona Browner, DDS;  Location: [facility];  Service: Oral Surgery;  Laterality: N/A;  . TONSILLECTOMY    . TOTAL VAGINAL HYSTERECTOMY    . TUBAL LIGATION       OB History   None      Home Medications    Prior to Admission medications   Medication Sig Start Date End Date Taking? Authorizing Provider  albuterol (PROVENTIL) (2.5 MG/3ML) 0.083% nebulizer solution Take 3 mLs (2.5 mg total) by nebulization every 4 (four) hours as needed for wheezing or shortness of breath. 06/02/16   Dettinger, Fransisca Kaufmann, MD  Amino Acids-Protein Hydrolys (FEEDING SUPPLEMENT, PRO-STAT SUGAR FREE 64,) LIQD Take 30 mLs by mouth 2 (two) times daily. 09/30/17   Jani Gravel, MD  atorvastatin (LIPITOR) 20 MG tablet Take 1 tablet (20 mg total) by mouth daily at 6 PM. 09/30/17   Jani Gravel, MD  benztropine (COGENTIN) 1 MG tablet Take 1 tablet (1 mg total) by mouth at bedtime. 10/11/17 10/11/18  Arfeen, Arlyce Harman, MD  BREO ELLIPTA 100-25 MCG/INH AEPB INHALE 1 PUFF INTO THE LUNGS DAILY Patient taking differently: Inhale 1 puff into the lungs daily  as needed (sob and wheezing).  07/24/16   Dettinger, Fransisca Kaufmann, MD  celecoxib (CELEBREX) 200 MG capsule Take 1 capsule (200 mg total) by mouth 2 (two) times daily. 09/07/17   Dettinger, Fransisca Kaufmann, MD  cetirizine (ZYRTEC) 10 MG tablet TAKE 1 TABLET ONCE DAILY 08/22/16   Dettinger, Fransisca Kaufmann, MD  cloNIDine (CATAPRES) 0.1 MG tablet Take 0.1 mg by mouth at bedtime.  09/06/17   [provider]  diclofenac sodium (VOLTAREN) 1 % GEL Apply to affected area twice daily as needed Patient taking differently: Apply 2 g topically 2 (two) times daily as needed (PAIN).  03/15/16   Dettinger, Fransisca Kaufmann, MD  divalproex (DEPAKOTE) 500 MG DR tablet Take 3 tablets (1,500 mg total) by mouth at bedtime. 09/07/17   Dettinger, Fransisca Kaufmann, MD  DULoxetine (CYMBALTA) 60 MG capsule Take 1 capsule (60 mg total) by mouth daily. 07/27/17   Arfeen, Arlyce Harman, MD  fluticasone (FLONASE) 50 MCG/ACT nasal spray Place 1 spray 2 (two) times daily as needed into both nostrils for allergies or rhinitis. 04/16/17   Terald Sleeper, PA-C  hydrochlorothiazide (HYDRODIURIL) 25 MG tablet Take 1 tablet (25 mg total) by mouth daily. Patient not taking: Reported on 10/08/2017 10/01/17   Jani Gravel, MD  levothyroxine (SYNTHROID, LEVOTHROID) 125 MCG tablet Take 1 tablet (125 mcg total) by mouth daily before breakfast. 10/01/17   Jani Gravel, MD  lisinopril-hydrochlorothiazide (PRINZIDE,ZESTORETIC) 20-25 MG tablet Take 1 tablet by mouth daily.    [provider]  nystatin cream (MYCOSTATIN) APPLY TO THE AFFECTED AREA THREE TIMES DAILY AS NEEDED .Marland KitchenFOR TOPICAL USE ONLY... 02/20/17   Dettinger, Fransisca Kaufmann, MD  omeprazole (PRILOSEC) 20 MG capsule TAKE (1) CAPSULE DAILY 09/06/17   Dettinger, Fransisca Kaufmann, MD  ondansetron (ZOFRAN ODT) 4 MG disintegrating tablet Take 1 tablet (4 mg total) by mouth every 8 (eight) hours as needed for nausea or vomiting. 07/05/17   Dettinger, Fransisca Kaufmann, MD  polyethylene glycol powder (GLYCOLAX/MIRALAX) powder Take 17 g by mouth 2 (two) times  daily as needed. Patient taking differently: Take 17 g by mouth 2 (two) times daily as needed for moderate constipation.  06/07/17   Dettinger, Fransisca Kaufmann, MD  PROAIR HFA 108 [phone] Base) MCG/ACT inhaler USE 2 PUFF EVERY 4 HOURS AS NEEDED FOR WHEEZING OR SHORTNESS OF BREATH 07/24/16   Dettinger, Fransisca Kaufmann, MD  promethazine (PHENERGAN) 25 MG tablet Take 1 tablet (25 mg total) by mouth every 8 (eight) hours as needed for nausea or vomiting. 07/05/17   Dettinger, Fransisca Kaufmann, MD  risperidone (RISPERDAL) 4 MG tablet Take 1 tablet (4 mg total) by mouth at bedtime. 10/04/17 10/04/18  Arfeen, Arlyce Harman, MD  traMADol (ULTRAM) 50 MG tablet Take 1 tablet (50 mg total) by mouth every 8 (eight) hours as needed for moderate pain. 07/20/17   Terald Sleeper, PA-C    Family History Family History  Problem Relation Age of Onset  . Hypertension Mother   . Bipolar disorder Mother   . Dementia Mother   . Depression Mother   . Heart attack Mother   . AAA (abdominal aortic aneurysm) Mother   . Coronary artery disease Father   . Alcohol abuse Father   . Hypertension Brother   . Coronary artery disease Brother   . Bipolar disorder Brother   . Depression Brother   . Depression Sister   . Paranoid behavior Sister   . Cancer Sister        breast  . Bipolar disorder Sister   . Depression Sister   . Hypertension Sister   . Cancer Son        thyroid  . Cancer Maternal Aunt        breast metastatized to brain  . Anesthesia problems Neg Hx   . Hypotension Neg Hx   . Malignant hyperthermia Neg Hx   . Pseudochol deficiency Neg Hx     Social History Social History   Tobacco Use  . Smoking status: Never Smoker  . Smokeless tobacco: Never Used  Substance Use Topics  . Alcohol use: No    Alcohol/week: 0.0 oz  . Drug use: No     Allergies   Abilify [aripiprazole]; Naproxen; Ativan [lorazepam]; Haldol [haloperidol]; and Hydroxyzine   Review of Systems Review of Systems  Constitutional: Negative for  activity change,  appetite change and fatigue.  HENT: Negative for congestion and rhinorrhea.   Eyes: Negative for visual disturbance.  Respiratory: Positive for chest tightness and shortness of breath. Negative for cough.   Cardiovascular: Negative for chest pain and leg swelling.  Gastrointestinal: Negative for abdominal pain, nausea and vomiting.  Genitourinary: Negative for dysuria, vaginal bleeding and vaginal discharge.  Musculoskeletal: Positive for back pain and neck pain.  Skin: Negative for rash.  Neurological: Positive for dizziness, weakness, light-headedness and headaches. Negative for numbness.  Psychiatric/Behavioral: Positive for hallucinations. The patient is nervous/anxious.     all other systems are negative except as noted in the HPI and PMH.    Physical Exam Updated Vital Signs BP 139/81 (BP Location: Right Arm)   Pulse (!) 109   Temp 97.9 F (36.6 C) (Oral)   Resp 18   Ht 5\' 9"  (1.753 m)   Wt (!) 181.4 kg (400 lb)   LMP 02/18/1995   SpO2 99%   BMI 59.07 kg/m   Physical Exam  Constitutional: She is oriented to person, place, and time. She appears well-developed and well-nourished. No distress.  HENT:  Head: Normocephalic and atraumatic.  Mouth/Throat: Oropharynx is clear and moist. No oropharyngeal exudate.  Mildly dry mucus membranes Occipital hematoma  Eyes: Pupils are equal, round, and reactive to light. Conjunctivae and EOM are normal.  Neck: Normal range of motion. Neck supple.  Diffuse paraspinal cervical tenderness  Cardiovascular: Normal rate, normal heart sounds and intact distal pulses.  No murmur heard. Mild tachycardic 100s  Pulmonary/Chest: Effort normal and breath sounds normal. No respiratory distress. She exhibits no tenderness.  Abdominal: Soft. There is no tenderness. There is no rebound and no guarding.  Musculoskeletal: Normal range of motion. She exhibits tenderness. She exhibits no edema.  TTP across trapezius and and upper paraspinal  thoracic    Neurological: She is alert and oriented to person, place, and time. No cranial nerve deficit. She exhibits normal muscle tone. Coordination normal.   5/5 strength throughout. CN 2-12 intact.Equal grip strength.   Skin: Skin is warm. Capillary refill takes less than 2 seconds.  Psychiatric: She has a normal mood and affect. Her behavior is normal.  Nursing note and vitals reviewed.    ED Treatments / Results  Labs (all labs ordered are listed, but only abnormal results are displayed) Labs Reviewed  CBC WITH DIFFERENTIAL/PLATELET - Abnormal; Notable for the following components:      Result Value   RBC 3.62 (*)    Hemoglobin 11.3 (*)    HCT 33.4 (*)    Platelets 93 (*)    All other components within normal limits  COMPREHENSIVE METABOLIC PANEL - Abnormal; Notable for the following components:   Chloride 97 (*)    Glucose, Bld 114 (*)    Creatinine, Ser 1.01 (*)    ALT 10 (*)    GFR calc non Af Amer 58 (*)    All other components within normal limits  URINALYSIS, ROUTINE W REFLEX MICROSCOPIC - Abnormal; Notable for the following components:   Ketones, ur 5 (*)    Leukocytes, UA MODERATE (*)    Bacteria, UA RARE (*)    All other components within normal limits  URINE CULTURE  TROPONIN I  D-DIMER, QUANTITATIVE (NOT AT [facility])  VALPROIC ACID LEVEL    EKG EKG Interpretation  Date/Time:  Sunday Oct 21 2017 22:37:37 EDT Ventricular Rate:  107 PR Interval:    QRS Duration: 111 QT Interval:  345  QTC Calculation: 461 R Axis:   9 Text Interpretation:  Sinus tachycardia No significant change was found Confirmed by Ezequiel Essex [phone]) on 10/21/2017 11:22:06 PM   Radiology Dg Chest 2 View  Result Date: 10/22/2017 CLINICAL DATA:  Altered mental status. EXAM: CHEST - 2 VIEW COMPARISON:  08/01/2017 FINDINGS: Normal heart size and pulmonary vascularity. No focal airspace disease or consolidation in the lungs. No blunting of costophrenic angles. No pneumothorax. Mediastinal  contours appear intact. Degenerative changes in the spine and shoulders. IMPRESSION: No active cardiopulmonary disease. Electronically Signed   By: Lucienne Capers M.D.   On: 10/22/2017 00:23   Ct Head Wo Contrast  Result Date: 10/21/2017 CLINICAL DATA:  Fall with head impact. EXAM: CT HEAD WITHOUT CONTRAST CT CERVICAL SPINE WITHOUT CONTRAST TECHNIQUE: Multidetector CT imaging of the head and cervical spine was performed following the standard protocol without intravenous contrast. Multiplanar CT image reconstructions of the cervical spine were also generated. COMPARISON:  Head CT 09/26/2017 FINDINGS: CT HEAD FINDINGS Brain: There is no mass, hemorrhage or extra-axial collection. The size and configuration of the ventricles and extra-axial CSF spaces are normal. There is no acute or chronic infarction. There is hypoattenuation of the periventricular white matter, most commonly indicating chronic ischemic microangiopathy. Vascular: No abnormal hyperdensity of the major intracranial arteries or dural venous sinuses. No intracranial atherosclerosis. Skull: The visualized skull base, calvarium and extracranial soft tissues are normal. Sinuses/Orbits: No fluid levels or advanced mucosal thickening of the visualized paranasal sinuses. No mastoid or middle ear effusion. The orbits are normal. CT CERVICAL SPINE FINDINGS Alignment: No static subluxation. Facets are aligned. Occipital condyles are normally positioned. Skull base and vertebrae: No acute fracture. Soft tissues and spinal canal: No prevertebral fluid or swelling. No visible canal hematoma. Disc levels: No advanced spinal canal or neural foraminal stenosis. Upper chest: No pneumothorax, pulmonary nodule or pleural effusion. Other: Normal visualized paraspinal cervical soft tissues. IMPRESSION: 1. Chronic ischemic microangiopathy without acute intracranial abnormality. 2. No acute fracture or static subluxation of the cervical spine. Electronically Signed    By: Ulyses Jarred M.D.   On: 10/21/2017 23:53   Ct Cervical Spine Wo Contrast  Result Date: 10/21/2017 CLINICAL DATA:  Fall with head impact. EXAM: CT HEAD WITHOUT CONTRAST CT CERVICAL SPINE WITHOUT CONTRAST TECHNIQUE: Multidetector CT imaging of the head and cervical spine was performed following the standard protocol without intravenous contrast. Multiplanar CT image reconstructions of the cervical spine were also generated. COMPARISON:  Head CT 09/26/2017 FINDINGS: CT HEAD FINDINGS Brain: There is no mass, hemorrhage or extra-axial collection. The size and configuration of the ventricles and extra-axial CSF spaces are normal. There is no acute or chronic infarction. There is hypoattenuation of the periventricular white matter, most commonly indicating chronic ischemic microangiopathy. Vascular: No abnormal hyperdensity of the major intracranial arteries or dural venous sinuses. No intracranial atherosclerosis. Skull: The visualized skull base, calvarium and extracranial soft tissues are normal. Sinuses/Orbits: No fluid levels or advanced mucosal thickening of the visualized paranasal sinuses. No mastoid or middle ear effusion. The orbits are normal. CT CERVICAL SPINE FINDINGS Alignment: No static subluxation. Facets are aligned. Occipital condyles are normally positioned. Skull base and vertebrae: No acute fracture. Soft tissues and spinal canal: No prevertebral fluid or swelling. No visible canal hematoma. Disc levels: No advanced spinal canal or neural foraminal stenosis. Upper chest: No pneumothorax, pulmonary nodule or pleural effusion. Other: Normal visualized paraspinal cervical soft tissues. IMPRESSION: 1. Chronic ischemic microangiopathy without acute intracranial abnormality. 2. No acute  fracture or static subluxation of the cervical spine. Electronically Signed   By: Ulyses Jarred M.D.   On: 10/21/2017 23:53    Procedures Procedures (including critical care time)  Medications Ordered in  ED Medications  sodium chloride 0.9 % bolus 500 mL (has no administration in time range)     Initial Impression / Assessment and Plan / ED Course  I have reviewed the triage vital signs and the nursing notes.  Pertinent labs & imaging results that were available during my care of the patient were reviewed by me and considered in my medical decision making (see chart for details).    Patient with mechanical fall tripping over her oxygen tubing striking the back of her head.  Denies loss of consciousness.  She reports her shortness of breath is somewhat worse than baseline.  She is mildly tachycardic.  Niece at bedside states she been having intermittent chest pain since her discharge from the hospital 3 weeks ago.  No chest pain at this time.  CT head and C-spine are negative.  EKG is unchanged the rate is faster. D-dimer is negative.  Doubt pulmonary embolism. First troponin negative.    Orthostatics are positive, heart rate elevates to 127 with standing and patient becomes dizzy.  Her urinalysis is concerning for infection again and culture is sent.  Patient with orthostasis and dizziness with standing.  Work-up was otherwise reassuring.  Will treat for UTI. Some difficulty walking and dizziness present. Observation admission d/w Dr. Tamala Julian.  Final Clinical Impressions(s) / ED Diagnoses   Final diagnoses:  Fall, initial encounter  Orthostasis  Urinary tract infection with hematuria, site unspecified    ED Discharge Orders    None       Elmus Mathes, Annie Main, MD 10/22/17 [phone]

## 2017-10-21 NOTE — ED Triage Notes (Signed)
Pt family reports falling and hitting head on coffee table. Pt family reports that she's been having hallucinations.

## 2017-10-22 ENCOUNTER — Encounter (HOSPITAL_COMMUNITY): Payer: Self-pay | Admitting: Radiology

## 2017-10-22 ENCOUNTER — Inpatient Hospital Stay (HOSPITAL_COMMUNITY): Payer: Medicare Other

## 2017-10-22 DIAGNOSIS — E039 Hypothyroidism, unspecified: Secondary | ICD-10-CM | POA: Diagnosis present

## 2017-10-22 DIAGNOSIS — J449 Chronic obstructive pulmonary disease, unspecified: Secondary | ICD-10-CM | POA: Diagnosis present

## 2017-10-22 DIAGNOSIS — I110 Hypertensive heart disease with heart failure: Secondary | ICD-10-CM | POA: Diagnosis present

## 2017-10-22 DIAGNOSIS — F419 Anxiety disorder, unspecified: Secondary | ICD-10-CM | POA: Diagnosis present

## 2017-10-22 DIAGNOSIS — Z6841 Body Mass Index (BMI) 40.0 and over, adult: Secondary | ICD-10-CM | POA: Diagnosis not present

## 2017-10-22 DIAGNOSIS — E785 Hyperlipidemia, unspecified: Secondary | ICD-10-CM | POA: Diagnosis present

## 2017-10-22 DIAGNOSIS — G9349 Other encephalopathy: Secondary | ICD-10-CM | POA: Diagnosis not present

## 2017-10-22 DIAGNOSIS — Z7951 Long term (current) use of inhaled steroids: Secondary | ICD-10-CM | POA: Diagnosis not present

## 2017-10-22 DIAGNOSIS — K219 Gastro-esophageal reflux disease without esophagitis: Secondary | ICD-10-CM | POA: Diagnosis present

## 2017-10-22 DIAGNOSIS — N200 Calculus of kidney: Secondary | ICD-10-CM | POA: Diagnosis not present

## 2017-10-22 DIAGNOSIS — E860 Dehydration: Secondary | ICD-10-CM | POA: Diagnosis present

## 2017-10-22 DIAGNOSIS — N390 Urinary tract infection, site not specified: Secondary | ICD-10-CM | POA: Diagnosis not present

## 2017-10-22 DIAGNOSIS — I252 Old myocardial infarction: Secondary | ICD-10-CM | POA: Diagnosis not present

## 2017-10-22 DIAGNOSIS — Z79899 Other long term (current) drug therapy: Secondary | ICD-10-CM | POA: Diagnosis not present

## 2017-10-22 DIAGNOSIS — I868 Varicose veins of other specified sites: Secondary | ICD-10-CM | POA: Insufficient documentation

## 2017-10-22 DIAGNOSIS — W19XXXA Unspecified fall, initial encounter: Secondary | ICD-10-CM

## 2017-10-22 DIAGNOSIS — I2510 Atherosclerotic heart disease of native coronary artery without angina pectoris: Secondary | ICD-10-CM | POA: Diagnosis present

## 2017-10-22 DIAGNOSIS — F315 Bipolar disorder, current episode depressed, severe, with psychotic features: Secondary | ICD-10-CM | POA: Diagnosis present

## 2017-10-22 DIAGNOSIS — F4310 Post-traumatic stress disorder, unspecified: Secondary | ICD-10-CM | POA: Diagnosis present

## 2017-10-22 DIAGNOSIS — G4733 Obstructive sleep apnea (adult) (pediatric): Secondary | ICD-10-CM | POA: Diagnosis present

## 2017-10-22 DIAGNOSIS — I951 Orthostatic hypotension: Secondary | ICD-10-CM | POA: Diagnosis not present

## 2017-10-22 DIAGNOSIS — Z96653 Presence of artificial knee joint, bilateral: Secondary | ICD-10-CM | POA: Diagnosis present

## 2017-10-22 DIAGNOSIS — R440 Auditory hallucinations: Secondary | ICD-10-CM | POA: Diagnosis not present

## 2017-10-22 DIAGNOSIS — K760 Fatty (change of) liver, not elsewhere classified: Secondary | ICD-10-CM | POA: Diagnosis not present

## 2017-10-22 DIAGNOSIS — R319 Hematuria, unspecified: Secondary | ICD-10-CM | POA: Diagnosis not present

## 2017-10-22 DIAGNOSIS — N10 Acute tubulo-interstitial nephritis: Secondary | ICD-10-CM | POA: Diagnosis not present

## 2017-10-22 DIAGNOSIS — I5032 Chronic diastolic (congestive) heart failure: Secondary | ICD-10-CM | POA: Diagnosis not present

## 2017-10-22 DIAGNOSIS — R32 Unspecified urinary incontinence: Secondary | ICD-10-CM | POA: Diagnosis present

## 2017-10-22 LAB — CBC WITH DIFFERENTIAL/PLATELET
BASOS PCT: 0 %
Basophils Absolute: 0 10*3/uL (ref 0.0–0.1)
EOS ABS: 0.1 10*3/uL (ref 0.0–0.7)
Eosinophils Relative: 1 %
HEMATOCRIT: 33.4 % — AB (ref 36.0–46.0)
Hemoglobin: 11.3 g/dL — ABNORMAL LOW (ref 12.0–15.0)
LYMPHS ABS: 1.5 10*3/uL (ref 0.7–4.0)
Lymphocytes Relative: 20 %
MCH: 31.2 pg (ref 26.0–34.0)
MCHC: 33.8 g/dL (ref 30.0–36.0)
MCV: 92.3 fL (ref 78.0–100.0)
Monocytes Absolute: 1 10*3/uL (ref 0.1–1.0)
Monocytes Relative: 13 %
Neutro Abs: 5 10*3/uL (ref 1.7–7.7)
Neutrophils Relative %: 66 %
Platelets: 93 10*3/uL — ABNORMAL LOW (ref 150–400)
RBC: 3.62 MIL/uL — AB (ref 3.87–5.11)
RDW: 13.4 % (ref 11.5–15.5)
WBC: 7.6 10*3/uL (ref 4.0–10.5)

## 2017-10-22 LAB — COMPREHENSIVE METABOLIC PANEL
ALBUMIN: 3.6 g/dL (ref 3.5–5.0)
ALK PHOS: 49 U/L (ref 38–126)
ALT: 10 U/L — AB (ref 14–54)
AST: 15 U/L (ref 15–41)
Anion gap: 10 (ref 5–15)
BUN: 17 mg/dL (ref 6–20)
CALCIUM: 9.3 mg/dL (ref 8.9–10.3)
CO2: 28 mmol/L (ref 22–32)
CREATININE: 1.01 mg/dL — AB (ref 0.44–1.00)
Chloride: 97 mmol/L — ABNORMAL LOW (ref 101–111)
GFR calc Af Amer: >60 mL/min (ref >60)
GFR calc non Af Amer: 58 mL/min — ABNORMAL LOW (ref >60)
GLUCOSE: 114 mg/dL — AB (ref 65–99)
Potassium: 3.5 mmol/L (ref 3.5–5.1)
SODIUM: 135 mmol/L (ref 135–145)
Total Bilirubin: 0.8 mg/dL (ref 0.3–1.2)
Total Protein: 6.5 g/dL (ref 6.5–8.1)

## 2017-10-22 LAB — TROPONIN I: Troponin I: <0.03 ng/mL (ref <0.03)

## 2017-10-22 LAB — D-DIMER, QUANTITATIVE: D-Dimer, Quant: 0.43 ug/mL-FEU (ref 0.00–0.50)

## 2017-10-22 LAB — VALPROIC ACID LEVEL: Valproic Acid Lvl: 62 ug/mL (ref 50.0–100.0)

## 2017-10-22 LAB — HEMOGLOBIN A1C
Hgb A1c MFr Bld: 5.4 % (ref 4.8–5.6)
Mean Plasma Glucose: 108.28 mg/dL

## 2017-10-22 MED ORDER — ONDANSETRON HCL 4 MG PO TABS: 4.0000 mg | ORAL_TABLET | Freq: Four times a day (QID) | ORAL | Status: DC | PRN

## 2017-10-22 MED ORDER — ACETAMINOPHEN 650 MG RE SUPP: 650.0000 mg | Freq: Four times a day (QID) | RECTAL | Status: DC | PRN

## 2017-10-22 MED ORDER — ONDANSETRON HCL 4 MG/2ML IJ SOLN: 4.0000 mg | Freq: Four times a day (QID) | INTRAMUSCULAR | Status: DC | PRN

## 2017-10-22 MED ORDER — CEFTRIAXONE SODIUM 1 G IJ SOLR
1.0000 g | INTRAMUSCULAR | Status: DC
  Administered 2017-10-22 – 2017-10-23 (×2): 1 g via INTRAVENOUS
  Filled 2017-10-22 (×2): qty 1

## 2017-10-22 MED ORDER — LEVOTHYROXINE SODIUM 25 MCG PO TABS
125.0000 ug | ORAL_TABLET | Freq: Every day | ORAL | Status: DC
  Administered 2017-10-23 – 2017-10-24 (×2): 125 ug via ORAL
  Filled 2017-10-22 (×2): qty 1

## 2017-10-22 MED ORDER — TRAMADOL HCL 50 MG PO TABS
50.0000 mg | ORAL_TABLET | Freq: Three times a day (TID) | ORAL | Status: DC | PRN
  Administered 2017-10-22 – 2017-10-23 (×2): 50 mg via ORAL
  Filled 2017-10-22 (×2): qty 1

## 2017-10-22 MED ORDER — LEVALBUTEROL HCL 0.63 MG/3ML IN NEBU: 0.6300 mg | INHALATION_SOLUTION | Freq: Four times a day (QID) | RESPIRATORY_TRACT | Status: DC | PRN

## 2017-10-22 MED ORDER — ALBUTEROL SULFATE HFA 108 (90 BASE) MCG/ACT IN AERS: 1.0000 | INHALATION_SPRAY | Freq: Four times a day (QID) | RESPIRATORY_TRACT | Status: DC | PRN

## 2017-10-22 MED ORDER — ATORVASTATIN CALCIUM 20 MG PO TABS
20.0000 mg | ORAL_TABLET | Freq: Every day | ORAL | Status: DC
  Administered 2017-10-22 – 2017-10-23 (×2): 20 mg via ORAL
  Filled 2017-10-22 (×2): qty 1

## 2017-10-22 MED ORDER — RISPERIDONE 1 MG PO TABS
4.0000 mg | ORAL_TABLET | Freq: Every day | ORAL | Status: DC
  Administered 2017-10-22 – 2017-10-23 (×2): 4 mg via ORAL
  Filled 2017-10-22 (×2): qty 4

## 2017-10-22 MED ORDER — ACETAMINOPHEN 325 MG PO TABS
650.0000 mg | ORAL_TABLET | Freq: Four times a day (QID) | ORAL | Status: DC | PRN
  Administered 2017-10-23: 650 mg via ORAL
  Filled 2017-10-22 (×2): qty 2

## 2017-10-22 MED ORDER — PANTOPRAZOLE SODIUM 40 MG PO TBEC
40.0000 mg | DELAYED_RELEASE_TABLET | Freq: Every day | ORAL | Status: DC
  Administered 2017-10-22 – 2017-10-24 (×3): 40 mg via ORAL
  Filled 2017-10-22 (×4): qty 1

## 2017-10-22 MED ORDER — ENOXAPARIN SODIUM 80 MG/0.8ML ~~LOC~~ SOLN
80.0000 mg | SUBCUTANEOUS | Status: DC
  Administered 2017-10-22 – 2017-10-23 (×2): 80 mg via SUBCUTANEOUS
  Filled 2017-10-22 (×2): qty 0.8

## 2017-10-22 MED ORDER — FLUTICASONE FUROATE-VILANTEROL 100-25 MCG/INH IN AEPB
1.0000 | INHALATION_SPRAY | Freq: Every day | RESPIRATORY_TRACT | Status: DC
  Filled 2017-10-22: qty 28

## 2017-10-22 MED ORDER — BENZTROPINE MESYLATE 0.5 MG PO TABS
1.0000 mg | ORAL_TABLET | Freq: Every day | ORAL | Status: DC
  Administered 2017-10-22 – 2017-10-23 (×2): 1 mg via ORAL
  Filled 2017-10-22 (×2): qty 2

## 2017-10-22 MED ORDER — CLONIDINE HCL 0.1 MG PO TABS
0.1000 mg | ORAL_TABLET | Freq: Two times a day (BID) | ORAL | Status: DC
  Administered 2017-10-22 – 2017-10-24 (×5): 0.1 mg via ORAL
  Filled 2017-10-22 (×5): qty 1

## 2017-10-22 MED ORDER — DIVALPROEX SODIUM 250 MG PO DR TAB
1500.0000 mg | DELAYED_RELEASE_TABLET | Freq: Every day | ORAL | Status: DC
  Administered 2017-10-22 – 2017-10-23 (×2): 1500 mg via ORAL
  Filled 2017-10-22 (×2): qty 6

## 2017-10-22 MED ORDER — ALBUTEROL SULFATE (2.5 MG/3ML) 0.083% IN NEBU
2.5000 mg | INHALATION_SOLUTION | Freq: Four times a day (QID) | RESPIRATORY_TRACT | Status: DC | PRN
  Administered 2017-10-23: 2.5 mg via RESPIRATORY_TRACT
  Filled 2017-10-22: qty 3

## 2017-10-22 MED ORDER — ENOXAPARIN SODIUM 40 MG/0.4ML ~~LOC~~ SOLN: 40.0000 mg | SUBCUTANEOUS | Status: DC

## 2017-10-22 MED ORDER — DULOXETINE HCL 60 MG PO CPEP
60.0000 mg | ORAL_CAPSULE | Freq: Every day | ORAL | Status: DC
  Administered 2017-10-22 – 2017-10-24 (×3): 60 mg via ORAL
  Filled 2017-10-22 (×3): qty 1

## 2017-10-22 MED ORDER — FLUTICASONE PROPIONATE 50 MCG/ACT NA SUSP
1.0000 | Freq: Two times a day (BID) | NASAL | Status: DC | PRN
  Filled 2017-10-22: qty 16

## 2017-10-22 MED ORDER — LORATADINE 10 MG PO TABS
10.0000 mg | ORAL_TABLET | Freq: Every day | ORAL | Status: DC
  Administered 2017-10-22 – 2017-10-23 (×2): 10 mg via ORAL
  Filled 2017-10-22 (×2): qty 1

## 2017-10-22 NOTE — ED Notes (Signed)
ED TO INPATIENT HANDOFF REPORT  Name/Age/Gender Allison Sharp 62 y.o. female  Code Status Code Status History    Date Active Date Inactive Code Status Order ID Comments User Context   09/27/2017 0131 09/30/2017 1922 Full Code [id number]  Reubin Milan, MD Inpatient   06/20/2016 1331 06/20/2016 2348 Full Code [id number]  Virgel Manifold, MD ED   01/17/2015 2147 01/23/2015 1754 Full Code [id number]  Delfin Gant, NP Inpatient   01/16/2015 2032 01/17/2015 2147 Full Code [id number]  Lacretia Leigh, MD ED   11/04/2012 2101 11/07/2012 2201 DNR [id number]  Oswald Hillock, MD Inpatient   10/24/2012 0825 10/24/2012 1543 Full Code [id number]  Hoy Morn, MD ED   01/07/2012 1716 01/09/2012 2048 Full Code [id number]  Janice Norrie, MD ED   09/25/2011 1608 09/26/2011 0254 Full Code [id number]  Donzetta Matters., MD ED   06/27/2011 1618 06/28/2011 0505 Full Code [id number]  Nat Christen, MD ED      Home/SNF/Other Home  Chief Complaint Fall; Head Injury  Level of Care/Admitting Diagnosis ED Disposition    ED Disposition Condition Comment   [place]: [facility] [[id number]]  Level of Care: Med-Surg [16]  Diagnosis: UTI (urinary tract infection) [786767]  Admitting Physician: Norval Morton [[id number]]  Attending Physician: Norval Morton [[id number]]  PT Class (Do Not Modify): Observation [104]  PT Acc Code (Do Not Modify): Observation [10022]       Medical History Past Medical History:  Diagnosis Date  . Allergy   . Anxiety   . Arthritis   . Asthma   . Bipolar 1 disorder ([place])   . CAD (coronary artery disease)   . Cellulitis   . CHF (congestive heart failure) (H[facility])    diastolic dysfunction  . Chronic back pain   . Chronic headaches   . Complication of anesthesia    States she typically gets sick s/p anesthesia  . Contusion of sacrum   . COPD (chronic obstructive pulmonary disease) ([place])   . Depression   . Dyspnea    PFT 03/05/09 FEV1 2.77(98%), FVC 3.25(86%),  FEV1% 85, TLC 5.88(99%), DLCO 60% ,  Methacholine challenge 03/16/09 normal ,  CT chest 03/12/09 no pulmonary disease  . Fungal infection   . GERD (gastroesophageal reflux disease)   . History of colonoscopy 10/17/2002   by Dr Rehman-> distal non-specific proctitis, small ext hemorrhoids,   . HTN (hypertension)   . Hyperlipidemia   . Hyperthyroidism   . Hypothyroidism    States she only has hyperthyroidism  . Migraine headache   . Morbid obesity with body mass index of 50.0-59.9 in adult [facility]) JAN 2011 370 LBS   2004 311 BMI 45.9  . Myocardial infarction ([place])    NOV 1997  . OSA on CPAP    she had been on 2L O2 at night but that was stopped  . PONV (postoperative nausea and vomiting)   . PTSD (post-traumatic stress disorder)   . Suicidal ideation   . Urine incontinence   . Vitamin D deficiency     Allergies Allergies  Allergen Reactions  . Abilify [Aripiprazole] Other (See Comments)    hallucinations  . Naproxen Nausea And Vomiting and Swelling  . Ativan [Lorazepam] Other (See Comments)    Delirium  . Haldol [Haloperidol] Other (See Comments)    Hallucinating   . Hydroxyzine Other (See Comments)    hallucinations    IV Location/Drains/Wounds Patient Lines/Drains/Airways Status   Active Line/Drains/Airways  Name:   Placement date:   Placement time:   Site:   Days:   Peripheral IV 10/22/17 Left Antecubital   10/22/17    0049    Antecubital   less than 1   Incision (Closed) 08/16/15 Lip Other (Comment)   08/16/15    0935     798          Labs/Imaging Results for orders placed or performed during the hospital encounter of 10/21/17 (from the past 48 hour(s))  Urinalysis, Routine w reflex microscopic     Status: Abnormal   Collection Time: 10/21/17 11:14 PM  Result Value Ref Range   Color, Urine YELLOW YELLOW   APPearance CLEAR CLEAR   Specific Gravity, Urine 1.015 1.005 - 1.030   pH 6.0 5.0 - 8.0   Glucose, UA NEGATIVE NEGATIVE mg/dL   Hgb urine dipstick  NEGATIVE NEGATIVE   Bilirubin Urine NEGATIVE NEGATIVE   Ketones, ur 5 (A) NEGATIVE mg/dL   Protein, ur NEGATIVE NEGATIVE mg/dL   Nitrite NEGATIVE NEGATIVE   Leukocytes, UA MODERATE (A) NEGATIVE   RBC / HPF 6-10 0 - 5 RBC/hpf   WBC, UA 21-50 0 - 5 WBC/hpf   Bacteria, UA RARE (A) NONE SEEN   Squamous Epithelial / LPF 0-5 0 - 5   Mucus PRESENT    Hyaline Casts, UA PRESENT     Comment: Performed at [facility], [place] [address] [place], [place] [address]  CBC with Differential/Platelet     Status: Abnormal   Collection Time: 10/22/17 12:44 AM  Result Value Ref Range   WBC 7.6 4.0 - 10.5 K/uL   RBC 3.62 (L) 3.87 - 5.11 MIL/uL   Hemoglobin 11.3 (L) 12.0 - 15.0 g/dL   HCT 33.4 (L) 36.0 - 46.0 %   MCV 92.3 78.0 - 100.0 fL   MCH 31.2 26.0 - 34.0 pg   MCHC 33.8 30.0 - 36.0 g/dL   RDW 13.4 11.5 - 15.5 %   Platelets 93 (L) 150 - 400 K/uL    Comment: SPECIMEN CHECKED FOR CLOTS REPEATED TO VERIFY PLATELET COUNT CONFIRMED BY SMEAR    Neutrophils Relative % 66 %   Neutro Abs 5.0 1.7 - 7.7 K/uL   Lymphocytes Relative 20 %   Lymphs Abs 1.5 0.7 - 4.0 K/uL   Monocytes Relative 13 %   Monocytes Absolute 1.0 0.1 - 1.0 K/uL   Eosinophils Relative 1 %   Eosinophils Absolute 0.1 0.0 - 0.7 K/uL   Basophils Relative 0 %   Basophils Absolute 0.0 0.0 - 0.1 K/uL    Comment: Performed at [facility], [place] [address], [place], [place] [address]  Comprehensive metabolic panel     Status: Abnormal   Collection Time: 10/22/17 12:44 AM  Result Value Ref Range   Sodium 135 135 - 145 mmol/L   Potassium 3.5 3.5 - 5.1 mmol/L   Chloride 97 (L) 101 - 111 mmol/L   CO2 28 22 - 32 mmol/L   Glucose, Bld 114 (H) 65 - 99 mg/dL   BUN 17 6 - 20 mg/dL   Creatinine, Ser 1.01 (H) 0.44 - 1.00 mg/dL   Calcium 9.3 8.9 - 10.3 mg/dL   Total Protein 6.5 6.5 - 8.1 g/dL   Albumin 3.6 3.5 - 5.0 g/dL   AST 15 15 - 41 U/L   ALT 10 (L) 14 - 54 U/L   Alkaline Phosphatase 49 38 - 126 U/L    Total Bilirubin 0.8 0.3 -  1.2 mg/dL   GFR calc non Af Amer 58 (L) >60 mL/min   GFR calc Af Amer >60 >60 mL/min    Comment: (NOTE) The eGFR has been calculated using the CKD EPI equation. This calculation has not been validated in all clinical situations. eGFR's persistently <60 mL/min signify possible Chronic Kidney Disease.    Anion gap 10 5 - 15    Comment: Performed at [facility], [place] [address] [place], [place] [address]  Troponin I     Status: None   Collection Time: 10/22/17 12:44 AM  Result Value Ref Range   Troponin I <0.03 <0.03 ng/mL    Comment: Performed at [facility], [place] [address], [place], [place] [address]  D-dimer, quantitative (not at [facility])     Status: None   Collection Time: 10/22/17 12:44 AM  Result Value Ref Range   D-Dimer, Quant 0.43 0.00 - 0.50 ug/mL-FEU    Comment: (NOTE) At the manufacturer cut-off of 0.50 ug/mL FEU, this assay has been documented to exclude PE with a sensitivity and negative predictive value of 97 to 99%.  At this time, this assay has not been approved by the FDA to exclude DVT/VTE. Results should be correlated with clinical presentation. Performed at [facility] [place] [address], [place], [place] [address]   Valproic acid level     Status: None   Collection Time: 10/22/17 12:44 AM  Result Value Ref Range   Valproic Acid Lvl 62 50.0 - 100.0 ug/mL    Comment: Performed at [facility], [place] [address] [place], [place] [address]   Dg Chest 2 View  Result Date: 10/22/2017 CLINICAL DATA:  Altered mental status. EXAM: CHEST - 2 VIEW COMPARISON:  08/01/2017 FINDINGS: Normal heart size and pulmonary vascularity. No focal airspace disease or consolidation in the lungs. No blunting of costophrenic angles. No pneumothorax. Mediastinal contours appear intact. Degenerative changes in the spine and shoulders. IMPRESSION: No active cardiopulmonary disease. Electronically  Signed   By: Lucienne Capers M.D.   On: 10/22/2017 00:23   Ct Head Wo Contrast  Result Date: 10/21/2017 CLINICAL DATA:  Fall with head impact. EXAM: CT HEAD WITHOUT CONTRAST CT CERVICAL SPINE WITHOUT CONTRAST TECHNIQUE: Multidetector CT imaging of the head and cervical spine was performed following the standard protocol without intravenous contrast. Multiplanar CT image reconstructions of the cervical spine were also generated. COMPARISON:  Head CT 09/26/2017 FINDINGS: CT HEAD FINDINGS Brain: There is no mass, hemorrhage or extra-axial collection. The size and configuration of the ventricles and extra-axial CSF spaces are normal. There is no acute or chronic infarction. There is hypoattenuation of the periventricular white matter, most commonly indicating chronic ischemic microangiopathy. Vascular: No abnormal hyperdensity of the major intracranial arteries or dural venous sinuses. No intracranial atherosclerosis. Skull: The visualized skull base, calvarium and extracranial soft tissues are normal. Sinuses/Orbits: No fluid levels or advanced mucosal thickening of the visualized paranasal sinuses. No mastoid or middle ear effusion. The orbits are normal. CT CERVICAL SPINE FINDINGS Alignment: No static subluxation. Facets are aligned. Occipital condyles are normally positioned. Skull base and vertebrae: No acute fracture. Soft tissues and spinal canal: No prevertebral fluid or swelling. No visible canal hematoma. Disc levels: No advanced spinal canal or neural foraminal stenosis. Upper chest: No pneumothorax, pulmonary nodule or pleural effusion. Other: Normal visualized paraspinal cervical soft tissues. IMPRESSION: 1. Chronic ischemic microangiopathy without acute intracranial abnormality. 2. No acute fracture or static subluxation of the cervical spine. Electronically  Signed   By: Ulyses Jarred M.D.   On: 10/21/2017 23:53   Ct Cervical Spine Wo Contrast  Result Date: 10/21/2017 CLINICAL DATA:  Fall with  head impact. EXAM: CT HEAD WITHOUT CONTRAST CT CERVICAL SPINE WITHOUT CONTRAST TECHNIQUE: Multidetector CT imaging of the head and cervical spine was performed following the standard protocol without intravenous contrast. Multiplanar CT image reconstructions of the cervical spine were also generated. COMPARISON:  Head CT 09/26/2017 FINDINGS: CT HEAD FINDINGS Brain: There is no mass, hemorrhage or extra-axial collection. The size and configuration of the ventricles and extra-axial CSF spaces are normal. There is no acute or chronic infarction. There is hypoattenuation of the periventricular white matter, most commonly indicating chronic ischemic microangiopathy. Vascular: No abnormal hyperdensity of the major intracranial arteries or dural venous sinuses. No intracranial atherosclerosis. Skull: The visualized skull base, calvarium and extracranial soft tissues are normal. Sinuses/Orbits: No fluid levels or advanced mucosal thickening of the visualized paranasal sinuses. No mastoid or middle ear effusion. The orbits are normal. CT CERVICAL SPINE FINDINGS Alignment: No static subluxation. Facets are aligned. Occipital condyles are normally positioned. Skull base and vertebrae: No acute fracture. Soft tissues and spinal canal: No prevertebral fluid or swelling. No visible canal hematoma. Disc levels: No advanced spinal canal or neural foraminal stenosis. Upper chest: No pneumothorax, pulmonary nodule or pleural effusion. Other: Normal visualized paraspinal cervical soft tissues. IMPRESSION: 1. Chronic ischemic microangiopathy without acute intracranial abnormality. 2. No acute fracture or static subluxation of the cervical spine. Electronically Signed   By: Ulyses Jarred M.D.   On: 10/21/2017 23:53    Pending Labs Unresulted Labs (From admission, onward)   Start     Ordered   10/21/17 2314  Urine Culture  Once,   STAT     10/21/17 2313      Vitals/Pain Today's Vitals   10/22/17 0225 10/22/17 0330 10/22/17  0545 10/22/17 0554  BP: 116/67 116/67 (!) 114/58 (!) 114/58  Pulse: 91 88  70  Resp: '14 17 12 16  ' Temp:      TempSrc:      SpO2: 99% 100% 100% 94%  Weight:      Height:      PainSc:        Isolation Precautions No active isolations  Medications Medications  0.9 %  sodium chloride infusion (has no administration in time range)  sodium chloride 0.9 % bolus 500 mL (0 mLs Intravenous Stopped 10/22/17 0149)  sodium chloride 0.9 % bolus 500 mL (0 mLs Intravenous Stopped 10/22/17 0553)  cefTRIAXone (ROCEPHIN) 1 g in sodium chloride 0.9 % 100 mL IVPB (0 g Intravenous Stopped 10/22/17 0446)    Mobility walks with person assist

## 2017-10-22 NOTE — Evaluation (Signed)
Physical Therapy Evaluation Patient Details Name: Allison Sharp MRN: [id number] DOB: [birth year] Today's Date: 10/22/2017   History of Present Illness  62 yo female admitted with fall at home, UTI, acute encephalopathy, hallucinations. Hx of depression, bipolar, MI, PTSD, CHF, OA, morbid obesity, COPD-O2 dep, back pain     Clinical Impression  On eval, pt required Min assist +2 for standing and Min guard assist +2 for safety/equip for ambulation. She walked ~50 feet with a RW. Pt participated well/followed commands well. O2 sat 95% on 3L [place] O2 during session. Discussed d/c plan-she plans to return home where she lives with her niece who assists her as needed. Pt presents with general weakness, decreased activity tolerance, and impaired gait and balance. Will follow and progress activity as tolerate.     Follow Up Recommendations Home health PT (24 hour supervision until pt returns to baseline)    Equipment Recommendations  None recommended by PT    Recommendations for Other Services OT consult     Precautions / Restrictions Precautions Precautions: Fall Precaution Comments: O2 dep Restrictions Weight Bearing Restrictions: No      Mobility  Bed Mobility               General bed mobility comments: oob in recliner  Transfers Overall transfer level: Needs assistance Equipment used: Rolling walker (2 wheeled) Transfers: Sit to/from Stand Sit to Stand: Min assist;+2 physical assistance;+2 safety/equipment         General transfer comment: Assist to rise.   Ambulation/Gait Ambulation/Gait assistance: Min guard;+2 safety/equipment Ambulation Distance (Feet): 50 Feet Assistive device: Rolling walker (2 wheeled) Gait Pattern/deviations: Step-through pattern     General Gait Details: slow gait speed. No physical assist given but close guarding provided for safety. Remained on [place] O2  Stairs            Wheelchair Mobility    Modified Rankin (Stroke Patients  Only)       Balance Overall balance assessment: History of Falls;Needs assistance         Standing balance support: Bilateral upper extremity supported Standing balance-Leahy Scale: Poor                               Pertinent Vitals/Pain Pain Assessment: No/denies pain    Home Living Family/patient expects to be discharged to:: Private residence Living Arrangements: Other relatives Available Help at Discharge: Family;Available PRN/intermittently Type of Home: House       Home Layout: One level Home Equipment: [place] - 2 wheels;Cane - single point      Prior Function Level of [place]: Needs assistance   Gait / Transfers Assistance Needed: uses cane vs RW  ADL's / Homemaking Assistance Needed: niece assists with adls        Hand Dominance        Extremity/Trunk Assessment   Upper Extremity Assessment Upper Extremity Assessment: Generalized weakness    Lower Extremity Assessment Lower Extremity Assessment: Generalized weakness    Cervical / Trunk Assessment Cervical / Trunk Assessment: Normal  Communication   Communication: No difficulties  Cognition Arousal/Alertness: Awake/alert Behavior During Therapy: WFL for tasks assessed/performed Overall Cognitive Status: Within Functional Limits for tasks assessed                                        General Comments  Exercises     Assessment/Plan    PT Assessment Patient needs continued PT services  PT Problem List Decreased strength;Decreased balance;Decreased mobility;Decreased activity tolerance;Obesity       PT Treatment Interventions DME instruction;Gait training;Functional mobility training;Therapeutic activities;Balance training;Patient/family education;Therapeutic exercise    PT Goals (Current goals can be found in the Care Plan section)  Acute Rehab PT Goals Patient Stated Goal: home PT Goal Formulation: With patient Time For Goal Achievement:  11/05/17 Potential to Achieve Goals: Good    Frequency Min 3X/week   Barriers to discharge        Co-evaluation               AM-PAC PT "6 Clicks" Daily Activity  Outcome Measure Difficulty turning over in bed (including adjusting bedclothes, sheets and blankets)?: A Lot Difficulty moving from lying on back to sitting on the side of the bed? : Unable Difficulty sitting down on and standing up from a chair with arms (e.g., wheelchair, bedside commode, etc,.)?: Unable Help needed moving to and from a bed to chair (including a wheelchair)?: A Little Help needed walking in hospital room?: A Little Help needed climbing 3-5 steps with a railing? : A Lot 6 Click Score: 12    End of Session Equipment Utilized During Treatment: Oxygen Activity Tolerance: Patient tolerated treatment well Patient left: in chair;with call bell/phone within reach;with chair alarm set   PT Visit Diagnosis: Difficulty in walking, not elsewhere classified (R26.2);Muscle weakness (generalized) (M62.81)    Time: 9798-9211 PT Time Calculation (min) (ACUTE ONLY): 22 min   Charges:   PT Evaluation $PT Eval Moderate Complexity: 1 Mod     PT G Codes:          Weston Anna, MPT Pager: [phone]

## 2017-10-22 NOTE — H&P (Signed)
[facility] History and Physical  Allison Sharp:[id number] DOB: 1956/04/11 DOA: 10/21/2017  Referring physician:   PCP: Dettinger, Fransisca Kaufmann, MD   Chief Complaint: *  HPI:   62 year old female with a history of hypertension, depression, psychosis, anxiety, bipolar 1 disorder, PTSD, asthma, COPD, coronary artery disease, chronic diastolic heart failure, GERD, hypertension, dyslipidemia, hypothyroidism, obstructive sleep apnea on CPAP, recently admitted between 5/1- 5/5 for urinary tract infection, who presents to the ED on 5/26 with altered mental status, confusion,falling and hitting her head on the coffee table and hallucinating.patient tripped on her oxygen tubing and fell backwards and did not lose consciousness. Patient's niece who brought her and said that the patient had been having   hallucinating, episodes similar to her last admission when she had a urinary tract infection. No suicidal or homicidal ideation. Patient has been compliant with her medications. Next line ED course BP 139/81 (BP Location: Right Arm)   Pulse (!) 109   Temp 97.9 F (36.6 C) (Oral)   Resp 18   Ht 5\' 9"  (1.753 m)   Wt (!) 181.4 kg (400 lb)   LMP 02/18/1995   SpO2 99%   BMI 59.07 kg/m  Workup in the ED showed CT of the head and the C-spine to be negative, EKG showed sinus tachycardia, d-dimer negative, troponin negative, orthostatics positive explaining the elevated heart rate, UA positive for urinary tract infection, patient received Rocephin in the ED and was admitted for further evaluation.       Review of Systems: negative for the following  Constitutional: Negative for activity change, appetite change and fatigue.  HENT: Negative for congestion and rhinorrhea.   Eyes: Negative for visual disturbance.  Respiratory: Positive for chest tightness and shortness of breath. Negative for cough.   Cardiovascular: Negative for chest pain and leg swelling.  Gastrointestinal: Negative for  abdominal pain, nausea and vomiting.  Genitourinary: Negative for dysuria, vaginal bleeding and vaginal discharge.  Musculoskeletal: Positive for back pain and neck pain.  Skin: Negative for rash.  Neurological: Positive for dizziness, weakness, light-headedness and headaches. Negative for numbness.  Psychiatric/Behavioral: Positive for hallucinations. The patient is nervous/anxious        Past Medical History:  Diagnosis Date  . Allergy   . Anxiety   . Arthritis   . Asthma   . Bipolar 1 disorder ([place])   . CAD (coronary artery disease)   . Cellulitis   . CHF (congestive heart failure) (HCC)    diastolic dysfunction  . Chronic back pain   . Chronic headaches   . Complication of anesthesia    States she typically gets sick s/p anesthesia  . Contusion of sacrum   . COPD (chronic obstructive pulmonary disease) ([place])   . Depression   . Dyspnea    PFT 03/05/09 FEV1 2.77(98%), FVC 3.25(86%), FEV1% 85, TLC 5.88(99%), DLCO 60% ,  Methacholine challenge 03/16/09 normal ,  CT chest 03/12/09 no pulmonary disease  . Fungal infection   . GERD (gastroesophageal reflux disease)   . History of colonoscopy 10/17/2002   by Dr Rehman-> distal non-specific proctitis, small ext hemorrhoids,   . HTN (hypertension)   . Hyperlipidemia   . Hyperthyroidism   . Hypothyroidism    States she only has hyperthyroidism  . Migraine headache   . Morbid obesity with body mass index of 50.0-59.9 in adult [facility]) JAN 2011 370 LBS   2004 311 BMI 45.9  . Myocardial infarction ([place])    NOV 1997  . OSA  on CPAP    she had been on 2L O2 at night but that was stopped  . PONV (postoperative nausea and vomiting)   . PTSD (post-traumatic stress disorder)   . Suicidal ideation   . Urine incontinence   . Vitamin D deficiency      Past Surgical History:  Procedure Laterality Date  . ABDOMINAL HYSTERECTOMY     sept 1996  . APPENDECTOMY    . BACK SURGERY  2008  . CARDIAC CATHETERIZATION     nov 1997  .  CHOLECYSTECTOMY    . COLONOSCOPY  10/17/2002    Distal proctitis, small external hemorrhoids, otherwise/  normal colonoscopy. Suspect rectal bleeding secondary to hemorrhoids  . ESOPHAGOGASTRODUODENOSCOPY  03/18/09   fundic gland polyps/mild gastritis  . HERNIA REPAIR  1978  . JOINT REPLACEMENT     bil knee replacement  . KNEE ARTHROSCOPY    . MULTIPLE EXTRACTIONS WITH ALVEOLOPLASTY N/A 08/16/2015   Procedure: EXTRACTION OF TEETH THREE, SIX, EIGHT, NINE, ELEVEN, FOURTEEN, FIFTEEN, TWENTY-EIGHT WITH ALVEOLOPLASTY;  Surgeon: Diona Browner, DDS;  Location: [facility];  Service: Oral Surgery;  Laterality: N/A;  . TONSILLECTOMY    . TOTAL VAGINAL HYSTERECTOMY    . TUBAL LIGATION        Social History:  reports that she has never smoked. She has never used smokeless tobacco. She reports that she does not drink alcohol or use drugs.    Allergies  Allergen Reactions  . Abilify [Aripiprazole] Other (See Comments)    hallucinations  . Naproxen Nausea And Vomiting and Swelling  . Ativan [Lorazepam] Other (See Comments)    Delirium  . Haldol [Haloperidol] Other (See Comments)    Hallucinating   . Hydroxyzine Other (See Comments)    hallucinations    Family History  Problem Relation Age of Onset  . Hypertension Mother   . Bipolar disorder Mother   . Dementia Mother   . Depression Mother   . Heart attack Mother   . AAA (abdominal aortic aneurysm) Mother   . Coronary artery disease Father   . Alcohol abuse Father   . Hypertension Brother   . Coronary artery disease Brother   . Bipolar disorder Brother   . Depression Brother   . Depression Sister   . Paranoid behavior Sister   . Cancer Sister        breast  . Bipolar disorder Sister   . Depression Sister   . Hypertension Sister   . Cancer Son        thyroid  . Cancer Maternal Aunt        breast metastatized to brain  . Anesthesia problems Neg Hx   . Hypotension Neg Hx   . Malignant hyperthermia Neg Hx   . Pseudochol deficiency  Neg Hx         Prior to Admission medications   Medication Sig Start Date End Date Taking? Authorizing Provider  albuterol (PROVENTIL) (2.5 MG/3ML) 0.083% nebulizer solution Take 3 mLs (2.5 mg total) by nebulization every 4 (four) hours as needed for wheezing or shortness of breath. 06/02/16  Yes Dettinger, Fransisca Kaufmann, MD  atorvastatin (LIPITOR) 20 MG tablet Take 1 tablet (20 mg total) by mouth daily at 6 PM. 09/30/17  Yes Jani Gravel, MD  benztropine (COGENTIN) 1 MG tablet Take 1 tablet (1 mg total) by mouth at bedtime. 10/11/17 10/11/18 Yes Arfeen, Arlyce Harman, MD  BREO ELLIPTA 100-25 MCG/INH AEPB INHALE 1 PUFF INTO THE LUNGS DAILY Patient taking differently: Inhale 1  puff into the lungs daily as needed (sob and wheezing).  07/24/16  Yes Dettinger, Fransisca Kaufmann, MD  celecoxib (CELEBREX) 200 MG capsule Take 1 capsule (200 mg total) by mouth 2 (two) times daily. 09/07/17  Yes Dettinger, Fransisca Kaufmann, MD  cetirizine (ZYRTEC) 10 MG tablet TAKE 1 TABLET ONCE DAILY 08/22/16  Yes Dettinger, Fransisca Kaufmann, MD  cloNIDine (CATAPRES) 0.1 MG tablet Take 0.1 mg by mouth at bedtime.  09/06/17  Yes [provider]  diclofenac sodium (VOLTAREN) 1 % GEL Apply to affected area twice daily as needed Patient taking differently: Apply 2 g topically 2 (two) times daily as needed (PAIN).  03/15/16  Yes Dettinger, Fransisca Kaufmann, MD  divalproex (DEPAKOTE) 500 MG DR tablet Take 3 tablets (1,500 mg total) by mouth at bedtime. 09/07/17  Yes Dettinger, Fransisca Kaufmann, MD  DULoxetine (CYMBALTA) 60 MG capsule Take 1 capsule (60 mg total) by mouth daily. 07/27/17  Yes Arfeen, Arlyce Harman, MD  fluticasone (FLONASE) 50 MCG/ACT nasal spray Place 1 spray 2 (two) times daily as needed into both nostrils for allergies or rhinitis. 04/16/17  Yes Terald Sleeper, PA-C  levothyroxine (SYNTHROID, LEVOTHROID) 125 MCG tablet Take 1 tablet (125 mcg total) by mouth daily before breakfast. 10/01/17  Yes Jani Gravel, MD  lisinopril-hydrochlorothiazide (PRINZIDE,ZESTORETIC) 20-25 MG  tablet Take 1 tablet by mouth daily.   Yes [provider]  nystatin cream (MYCOSTATIN) APPLY TO THE AFFECTED AREA THREE TIMES DAILY AS NEEDED .Marland KitchenFOR TOPICAL USE ONLY... Patient taking differently: APPLY TO THE AFFECTED AREA THREE TIMES DAILY AS NEEDED FOR YEAST/IRRITATION..FOR TOPICAL USE ONLY... 02/20/17  Yes Dettinger, Fransisca Kaufmann, MD  omeprazole (PRILOSEC) 20 MG capsule TAKE (1) CAPSULE DAILY 09/06/17  Yes Dettinger, Fransisca Kaufmann, MD  ondansetron (ZOFRAN ODT) 4 MG disintegrating tablet Take 1 tablet (4 mg total) by mouth every 8 (eight) hours as needed for nausea or vomiting. 07/05/17  Yes Dettinger, Fransisca Kaufmann, MD  polyethylene glycol powder (GLYCOLAX/MIRALAX) powder Take 17 g by mouth 2 (two) times daily as needed. Patient taking differently: Take 17 g by mouth 2 (two) times daily as needed for moderate constipation.  06/07/17  Yes Dettinger, Fransisca Kaufmann, MD  PROAIR HFA 108 [phone] Base) MCG/ACT inhaler USE 2 PUFF EVERY 4 HOURS AS NEEDED FOR WHEEZING OR SHORTNESS OF BREATH 07/24/16  Yes Dettinger, Fransisca Kaufmann, MD  promethazine (PHENERGAN) 25 MG tablet Take 1 tablet (25 mg total) by mouth every 8 (eight) hours as needed for nausea or vomiting. 07/05/17  Yes Dettinger, Fransisca Kaufmann, MD  risperidone (RISPERDAL) 4 MG tablet Take 1 tablet (4 mg total) by mouth at bedtime. 10/04/17 10/04/18 Yes Arfeen, Arlyce Harman, MD  traMADol (ULTRAM) 50 MG tablet Take 1 tablet (50 mg total) by mouth every 8 (eight) hours as needed for moderate pain. 07/20/17  Yes Terald Sleeper, PA-C  Amino Acids-Protein Hydrolys (FEEDING SUPPLEMENT, PRO-STAT SUGAR FREE 64,) LIQD Take 30 mLs by mouth 2 (two) times daily. Patient not taking: Reported on 10/22/2017 09/30/17   Jani Gravel, MD  hydrochlorothiazide (HYDRODIURIL) 25 MG tablet Take 1 tablet (25 mg total) by mouth daily. Patient not taking: Reported on 10/08/2017 10/01/17   Jani Gravel, MD     Physical Exam: Vitals:   10/22/17 0330 10/22/17 0545 10/22/17 0554 10/22/17 0648  BP: 116/67 (!) 114/58 (!) 114/58  118/69  Pulse: 88  70 81  Resp: 17 12 16 16   Temp:    98.5 F (36.9 C)  TempSrc:    Oral  SpO2: 100%  100% 94% 100%  Weight:      Height:            Vitals:   10/22/17 0330 10/22/17 0545 10/22/17 0554 10/22/17 0648  BP: 116/67 (!) 114/58 (!) 114/58 118/69  Pulse: 88  70 81  Resp: 17 12 16 16   Temp:    98.5 F (36.9 C)  TempSrc:    Oral  SpO2: 100% 100% 94% 100%  Weight:      Height:       Constitutional: NAD, calm, comfortable Eyes: PERRL, lids and conjunctivae normal ENMT: Mucous membranes are moist. Posterior pharynx clear of any exudate or lesions.Normal dentition.  Neck: normal, supple, no masses, no thyromegaly Respiratory: clear to auscultation bilaterally, no wheezing, no crackles. Normal respiratory effort. No accessory muscle use.  Cardiovascular: Regular rate and rhythm, no murmurs / rubs / gallops. No extremity edema. 2+ pedal pulses. No carotid bruits.  Abdomen: no tenderness, no masses palpated. No hepatosplenomegaly. Bowel sounds positive.  Musculoskeletal: no clubbing / cyanosis. No joint deformity upper and lower extremities. Good ROM, no contractures. Normal muscle tone.  Skin: no rashes, lesions, ulcers. No induration Neurologic: CN 2-12 grossly intact. Sensation intact, DTR normal. Strength intact in bilateral upper and lower extremities  Psychiatric: Normal judgment and insight. Alert and oriented x to self. Normal mood.     Labs on Admission: I have personally reviewed following labs and imaging studies  CBC: Recent Labs  Lab 10/22/17 0044  WBC 7.6  NEUTROABS 5.0  HGB 11.3*  HCT 33.4*  MCV 92.3  PLT 93*    Basic Metabolic Panel: Recent Labs  Lab 10/22/17 0044  NA 135  K 3.5  CL 97*  CO2 28  GLUCOSE 114*  BUN 17  CREATININE 1.01*  CALCIUM 9.3    GFR: Estimated Creatinine Clearance: 101.1 mL/min (A) (by C-G formula based on SCr of 1.01 mg/dL (H)).  Liver Function Tests: Recent Labs  Lab 10/22/17 0044  AST 15  ALT 10*   ALKPHOS 49  BILITOT 0.8  PROT 6.5  ALBUMIN 3.6   No results for input(s): LIPASE, AMYLASE in the last 168 hours. No results for input(s): AMMONIA in the last 168 hours.  Coagulation Profile: No results for input(s): INR, PROTIME in the last 168 hours. Recent Labs    10/22/17 0044  DDIMER 0.43    Cardiac Enzymes: Recent Labs  Lab 10/22/17 0044  TROPONINI <0.03    BNP (last 3 results) No results for input(s): PROBNP in the last 8760 hours.  HbA1C: No results for input(s): HGBA1C in the last 72 hours. Lab Results  Component Value Date   HGBA1C 5.3 04/05/2017   HGBA1C 5.8 (H) 11/08/2015   HGBA1C 5.6 07/02/2015     CBG: No results for input(s): GLUCAP in the last 168 hours.  Lipid Profile: No results for input(s): CHOL, HDL, LDLCALC, TRIG, CHOLHDL, LDLDIRECT in the last 72 hours.  Thyroid Function Tests: No results for input(s): TSH, T4TOTAL, FREET4, T3FREE, THYROIDAB in the last 72 hours.  Anemia Panel: No results for input(s): VITAMINB12, FOLATE, FERRITIN, TIBC, IRON, RETICCTPCT in the last 72 hours.  Urine analysis:    Component Value Date/Time   COLORURINE YELLOW 10/21/2017 [place] 10/21/2017 2314   APPEARANCEUR Cloudy (A) 10/08/2017 1613   LABSPEC 1.015 10/21/2017 2314   PHURINE 6.0 10/21/2017 [place] 10/21/2017 2314   GLUCOSEU NEGATIVE 07/01/2013 1119   HGBUR NEGATIVE 10/21/2017 [place] NEGATIVE 10/21/2017 2314  BILIRUBINUR Negative 10/08/2017 1613   KETONESUR 5 (A) 10/21/2017 2314   PROTEINUR NEGATIVE 10/21/2017 2314   UROBILINOGEN 1.0 01/16/2015 1854   NITRITE NEGATIVE 10/21/2017 2314   LEUKOCYTESUR MODERATE (A) 10/21/2017 2314   LEUKOCYTESUR Trace (A) 10/08/2017 1613   LEUKOCYTESUR Small 07/01/2013 1647    Sepsis Labs: @LABRCNTIP (procalcitonin:4,lacticidven:4) )No results found for this or any previous visit (from the past 240 hour(s)).       Radiological Exams on Admission: Dg Chest 2  View  Result Date: 10/22/2017 CLINICAL DATA:  Altered mental status. EXAM: CHEST - 2 VIEW COMPARISON:  08/01/2017 FINDINGS: Normal heart size and pulmonary vascularity. No focal airspace disease or consolidation in the lungs. No blunting of costophrenic angles. No pneumothorax. Mediastinal contours appear intact. Degenerative changes in the spine and shoulders. IMPRESSION: No active cardiopulmonary disease. Electronically Signed   By: Lucienne Capers M.D.   On: 10/22/2017 00:23   Ct Head Wo Contrast  Result Date: 10/21/2017 CLINICAL DATA:  Fall with head impact. EXAM: CT HEAD WITHOUT CONTRAST CT CERVICAL SPINE WITHOUT CONTRAST TECHNIQUE: Multidetector CT imaging of the head and cervical spine was performed following the standard protocol without intravenous contrast. Multiplanar CT image reconstructions of the cervical spine were also generated. COMPARISON:  Head CT 09/26/2017 FINDINGS: CT HEAD FINDINGS Brain: There is no mass, hemorrhage or extra-axial collection. The size and configuration of the ventricles and extra-axial CSF spaces are normal. There is no acute or chronic infarction. There is hypoattenuation of the periventricular white matter, most commonly indicating chronic ischemic microangiopathy. Vascular: No abnormal hyperdensity of the major intracranial arteries or dural venous sinuses. No intracranial atherosclerosis. Skull: The visualized skull base, calvarium and extracranial soft tissues are normal. Sinuses/Orbits: No fluid levels or advanced mucosal thickening of the visualized paranasal sinuses. No mastoid or middle ear effusion. The orbits are normal. CT CERVICAL SPINE FINDINGS Alignment: No static subluxation. Facets are aligned. Occipital condyles are normally positioned. Skull base and vertebrae: No acute fracture. Soft tissues and spinal canal: No prevertebral fluid or swelling. No visible canal hematoma. Disc levels: No advanced spinal canal or neural foraminal stenosis. Upper chest:  No pneumothorax, pulmonary nodule or pleural effusion. Other: Normal visualized paraspinal cervical soft tissues. IMPRESSION: 1. Chronic ischemic microangiopathy without acute intracranial abnormality. 2. No acute fracture or static subluxation of the cervical spine. Electronically Signed   By: Ulyses Jarred M.D.   On: 10/21/2017 23:53   Ct Cervical Spine Wo Contrast  Result Date: 10/21/2017 CLINICAL DATA:  Fall with head impact. EXAM: CT HEAD WITHOUT CONTRAST CT CERVICAL SPINE WITHOUT CONTRAST TECHNIQUE: Multidetector CT imaging of the head and cervical spine was performed following the standard protocol without intravenous contrast. Multiplanar CT image reconstructions of the cervical spine were also generated. COMPARISON:  Head CT 09/26/2017 FINDINGS: CT HEAD FINDINGS Brain: There is no mass, hemorrhage or extra-axial collection. The size and configuration of the ventricles and extra-axial CSF spaces are normal. There is no acute or chronic infarction. There is hypoattenuation of the periventricular white matter, most commonly indicating chronic ischemic microangiopathy. Vascular: No abnormal hyperdensity of the major intracranial arteries or dural venous sinuses. No intracranial atherosclerosis. Skull: The visualized skull base, calvarium and extracranial soft tissues are normal. Sinuses/Orbits: No fluid levels or advanced mucosal thickening of the visualized paranasal sinuses. No mastoid or middle ear effusion. The orbits are normal. CT CERVICAL SPINE FINDINGS Alignment: No static subluxation. Facets are aligned. Occipital condyles are normally positioned. Skull base and vertebrae: No acute fracture. Soft tissues and  spinal canal: No prevertebral fluid or swelling. No visible canal hematoma. Disc levels: No advanced spinal canal or neural foraminal stenosis. Upper chest: No pneumothorax, pulmonary nodule or pleural effusion. Other: Normal visualized paraspinal cervical soft tissues. IMPRESSION: 1. Chronic  ischemic microangiopathy without acute intracranial abnormality. 2. No acute fracture or static subluxation of the cervical spine. Electronically Signed   By: Ulyses Jarred M.D.   On: 10/21/2017 23:53   Dg Chest 2 View  Result Date: 10/22/2017 CLINICAL DATA:  Altered mental status. EXAM: CHEST - 2 VIEW COMPARISON:  08/01/2017 FINDINGS: Normal heart size and pulmonary vascularity. No focal airspace disease or consolidation in the lungs. No blunting of costophrenic angles. No pneumothorax. Mediastinal contours appear intact. Degenerative changes in the spine and shoulders. IMPRESSION: No active cardiopulmonary disease. Electronically Signed   By: Lucienne Capers M.D.   On: 10/22/2017 00:23   Ct Head Wo Contrast  Result Date: 10/21/2017 CLINICAL DATA:  Fall with head impact. EXAM: CT HEAD WITHOUT CONTRAST CT CERVICAL SPINE WITHOUT CONTRAST TECHNIQUE: Multidetector CT imaging of the head and cervical spine was performed following the standard protocol without intravenous contrast. Multiplanar CT image reconstructions of the cervical spine were also generated. COMPARISON:  Head CT 09/26/2017 FINDINGS: CT HEAD FINDINGS Brain: There is no mass, hemorrhage or extra-axial collection. The size and configuration of the ventricles and extra-axial CSF spaces are normal. There is no acute or chronic infarction. There is hypoattenuation of the periventricular white matter, most commonly indicating chronic ischemic microangiopathy. Vascular: No abnormal hyperdensity of the major intracranial arteries or dural venous sinuses. No intracranial atherosclerosis. Skull: The visualized skull base, calvarium and extracranial soft tissues are normal. Sinuses/Orbits: No fluid levels or advanced mucosal thickening of the visualized paranasal sinuses. No mastoid or middle ear effusion. The orbits are normal. CT CERVICAL SPINE FINDINGS Alignment: No static subluxation. Facets are aligned. Occipital condyles are normally positioned.  Skull base and vertebrae: No acute fracture. Soft tissues and spinal canal: No prevertebral fluid or swelling. No visible canal hematoma. Disc levels: No advanced spinal canal or neural foraminal stenosis. Upper chest: No pneumothorax, pulmonary nodule or pleural effusion. Other: Normal visualized paraspinal cervical soft tissues. IMPRESSION: 1. Chronic ischemic microangiopathy without acute intracranial abnormality. 2. No acute fracture or static subluxation of the cervical spine. Electronically Signed   By: Ulyses Jarred M.D.   On: 10/21/2017 23:53   Ct Head Wo Contrast  Result Date: 09/26/2017 CLINICAL DATA:  Altered mental status. Increasing confusion over the last 2 weeks. Hallucinations. EXAM: CT HEAD WITHOUT CONTRAST TECHNIQUE: Contiguous axial images were obtained from the base of the skull through the vertex without intravenous contrast. COMPARISON:  10/21/2009 FINDINGS: Brain: No evidence of acute infarction, hemorrhage, hydrocephalus, extra-axial collection or mass lesion/mass effect. Mild cerebral atrophy. Vascular: No hyperdense vessel or unexpected calcification. Skull: Normal. Negative for fracture or focal lesion. Sinuses/Orbits: No acute finding. Other: None. IMPRESSION: No acute intracranial abnormalities.  Mild cerebral atrophy. Electronically Signed   By: Lucienne Capers M.D.   On: 09/26/2017 23:32   Ct Cervical Spine Wo Contrast  Result Date: 10/21/2017 CLINICAL DATA:  Fall with head impact. EXAM: CT HEAD WITHOUT CONTRAST CT CERVICAL SPINE WITHOUT CONTRAST TECHNIQUE: Multidetector CT imaging of the head and cervical spine was performed following the standard protocol without intravenous contrast. Multiplanar CT image reconstructions of the cervical spine were also generated. COMPARISON:  Head CT 09/26/2017 FINDINGS: CT HEAD FINDINGS Brain: There is no mass, hemorrhage or extra-axial collection. The size and configuration of  the ventricles and extra-axial CSF spaces are normal. There is  no acute or chronic infarction. There is hypoattenuation of the periventricular white matter, most commonly indicating chronic ischemic microangiopathy. Vascular: No abnormal hyperdensity of the major intracranial arteries or dural venous sinuses. No intracranial atherosclerosis. Skull: The visualized skull base, calvarium and extracranial soft tissues are normal. Sinuses/Orbits: No fluid levels or advanced mucosal thickening of the visualized paranasal sinuses. No mastoid or middle ear effusion. The orbits are normal. CT CERVICAL SPINE FINDINGS Alignment: No static subluxation. Facets are aligned. Occipital condyles are normally positioned. Skull base and vertebrae: No acute fracture. Soft tissues and spinal canal: No prevertebral fluid or swelling. No visible canal hematoma. Disc levels: No advanced spinal canal or neural foraminal stenosis. Upper chest: No pneumothorax, pulmonary nodule or pleural effusion. Other: Normal visualized paraspinal cervical soft tissues. IMPRESSION: 1. Chronic ischemic microangiopathy without acute intracranial abnormality. 2. No acute fracture or static subluxation of the cervical spine. Electronically Signed   By: Ulyses Jarred M.D.   On: 10/21/2017 23:53      EKG: Independently reviewed.  Sinus tachycardia  Assessment/Plan Active Problems:  acute encephalopathy secondary to UTI (urinary tract infection)  patient started on Rocephin Admit to telemetry for observation Given recurrent UTI, will image urinary tract or rule out nephrolithiasis, other anatomic cause of patient's recurrent UTI Urine culture sent, follow Valproic acid 62 Recent TSH 5.63 cT of the head negative for CVA, shows chronic microangiopathic  Dehydration Creatinine   1.01, baseline around 0.85 Will hydrate with IV fluids  Mental illness with multiple diagnoses, depression,anxiety, bipolar 1 disorder Continue duloxetine 60 mg, Depakote 1500 daily at bedtime, risperidone 4 mg daily at bedtime  and benztropine 1 mg daily Recent psychiatric consultation on 5/4 Currently denies any suicidal or homicidal ideation  Hypothyroidism continue Synthroid  GERD continue Protonix  [place] artery disease stable, troponin negative 1, continue statin  Hypertension-continue clonidine, resume hydrochlorothiazide 25 mg by mouth daily when hydrated   Dyslipidemia continue statin  Thrombocytopenia HIT panel on 5/3 was negative, referred to heme/ONC on discharge on 5/5  OSA Continue nocturnal CPAP    DVT prophylaxis:  Lovenox        consults called:  Family Communication: Admission, patients condition and plan of care including tests being ordered have been discussed with the patient  who indicates understanding and agree with the plan and Code Status   Admission status:  The appropriate patient status for this patient is INPATIENT. Inpatient status is judged to be reasonable and necessary in order to provide the required intensity of service to ensure the patient's safety. The patient's presenting symptoms, physical exam findings, and initial radiographic and laboratory data in the context of their chronic comorbidities is felt to place them at high risk for further clinical deterioration. Furthermore, it is not anticipated that the patient will be medically stable for discharge from the hospital within 2 midnights of admission. The following factors support the patient status of inpatient.    "           The patient's presenting symptoms include low back pain. "           The worrisome physical exam findings include and inability to walk. "           The initial radiographic and laboratory data are worrisome because of sacral fracture on CT. "           The chronic co-morbidities include history of hypertension.     *  I certify that at the point of admission it is my clinical judgment that the patient will require inpatient hospital care spanning beyond 2 midnights from the point of  admission due to high intensity of service, high risk for further deterioration and high frequency of surveillance required.*    Disposition plan: Further plan will depend as patient's clinical course evolves and further radiologic and laboratory data become available. Likely home when stable       Reyne Dumas MD [facility] Pager [phone]  If 7PM-7AM, please contact night-coverage [email or web address] Password [email or web address]  10/22/2017, 8:10 AM

## 2017-10-22 NOTE — ED Notes (Signed)
Pt normally ambulates using a walker at a distance not exceeding 50 feet (bedroom to bathroom to living room). Pt was ambulated with a walker to about that distance until she was fatigued, then she sat back down in a wheelchair.

## 2017-10-22 NOTE — Plan of Care (Addendum)
Ms. Dehne is a 62 year old female with extensive past medical history including CAD, chronic diastolic CHF last EF 55 to 60% with grade 1 diastolic dysfunction in 16/0109, asthma/COPD; who presents after fall at home.  Reports tripping on oxygen tubing.  Family members reporting patient having hallucinations again similar to last admission 5/1, when she was diagnosed with a urinary tract infection.  Previous urine culture was positive for E. coli sensitive to everything except for ciprofloxacin.    Upon admission into the emergency department patient was also noted to be orthostatic.  CT imaging of the head and neck did not show any acute abnormality.  UA showing signs for possible infection.  Patient empirically started on Rocephin and given a total of 1 L of normal saline IV fluids.  Accepted to MedSurg bed as observation for UTI.

## 2017-10-23 LAB — COMPREHENSIVE METABOLIC PANEL
ALT: 10 U/L — ABNORMAL LOW (ref 14–54)
ANION GAP: 10 (ref 5–15)
AST: 17 U/L (ref 15–41)
Albumin: 3.2 g/dL — ABNORMAL LOW (ref 3.5–5.0)
Alkaline Phosphatase: 44 U/L (ref 38–126)
BUN: 10 mg/dL (ref 6–20)
CHLORIDE: 103 mmol/L (ref 101–111)
CO2: 26 mmol/L (ref 22–32)
Calcium: 9.1 mg/dL (ref 8.9–10.3)
Creatinine, Ser: 0.86 mg/dL (ref 0.44–1.00)
GFR calc non Af Amer: >60 mL/min (ref >60)
Glucose, Bld: 119 mg/dL — ABNORMAL HIGH (ref 65–99)
POTASSIUM: 3.4 mmol/L — AB (ref 3.5–5.1)
Sodium: 139 mmol/L (ref 135–145)
Total Bilirubin: 0.5 mg/dL (ref 0.3–1.2)
Total Protein: 6 g/dL — ABNORMAL LOW (ref 6.5–8.1)

## 2017-10-23 LAB — CBC
HCT: 31.2 % — ABNORMAL LOW (ref 36.0–46.0)
Hemoglobin: 10.4 g/dL — ABNORMAL LOW (ref 12.0–15.0)
MCH: 31.3 pg (ref 26.0–34.0)
MCHC: 33.3 g/dL (ref 30.0–36.0)
MCV: 94 fL (ref 78.0–100.0)
PLATELETS: 92 10*3/uL — AB (ref 150–400)
RBC: 3.32 MIL/uL — ABNORMAL LOW (ref 3.87–5.11)
RDW: 13.5 % (ref 11.5–15.5)
WBC: 6.1 10*3/uL (ref 4.0–10.5)

## 2017-10-23 LAB — URINE CULTURE: Culture: <10000 — AB

## 2017-10-23 MED ORDER — FLUTICASONE FUROATE-VILANTEROL 100-25 MCG/INH IN AEPB
1.0000 | INHALATION_SPRAY | Freq: Every day | RESPIRATORY_TRACT | Status: DC
  Administered 2017-10-23 – 2017-10-24 (×2): 1 via RESPIRATORY_TRACT
  Filled 2017-10-23: qty 28

## 2017-10-23 NOTE — Progress Notes (Signed)
Patient unable to tolerate CPAP. Respiratory notified. Patient placed back on nasal cannula at 2 L at 98%

## 2017-10-23 NOTE — Evaluation (Signed)
Occupational Therapy Evaluation Patient Details Name: Allison Sharp MRN: [id number] DOB: 1956-03-22 Today's Date: 10/23/2017    History of Present Illness 62 yo female admitted with fall at home, UTI, acute encephalopathy, hallucinations. Hx of depression, bipolar, MI, PTSD, CHF, OA, morbid obesity, COPD-O2 dep, back pain   Clinical Impression   Pt was admitted for the above. She was seen by this OT on last admission.  She will benefit from continued OT to increase safety and independence with adls. Her niece assists her at baseline, and she has a BSC. Educated on toilet aide this session.  Will follow in acute setting with the goals below    Follow Up Recommendations  Home health OT    Equipment Recommendations  None recommended by OT    Recommendations for Other Services       Precautions / Restrictions Precautions Precautions: Fall Precaution Comments: O2 dep Restrictions Weight Bearing Restrictions: No      Mobility Bed Mobility         Supine to sit: Min guard;HOB elevated     General bed mobility comments: used bedrails  Transfers   Equipment used: Rolling walker (2 wheeled)   Sit to Stand: Min assist Stand pivot transfers: Min assist       General transfer comment: assist to rise    Balance Overall balance assessment: History of Falls;Needs assistance         Standing balance support: Bilateral upper extremity supported Standing balance-Leahy Scale: Poor                             ADL either performed or assessed with clinical judgement   ADL Overall ADL's : Needs assistance/impaired Eating/Feeding: Independent   Grooming: Set up;Sitting   Upper Body Bathing: Minimal assistance   Lower Body Bathing: Total assistance;Sit to/from stand;+2 for safety/equipment   Upper Body Dressing : Sitting;Minimal assistance;Moderate assistance   Lower Body Dressing: Total assistance;Sit to/from stand   Toilet Transfer: Minimal  assistance;Stand-pivot;RW(chair)   Toileting- Clothing Manipulation and Hygiene: Total assistance;Sit to/from stand         General ADL Comments: pt's purewick was not working properly. Assisted her with hygiene, LB bathing and changing gown. Educated on toilet aide as she would like to be able to perform hygiene herself. She also wants manage bed mobility but she is not strong enough to use a leg lifter at this time.  Will provide theraband on next visit     Vision         Perception     Praxis      Pertinent Vitals/Pain Pain Assessment: No/denies pain     Hand Dominance     Extremity/Trunk Assessment Upper Extremity Assessment Upper Extremity Assessment: Generalized weakness           Communication Communication Communication: No difficulties   Cognition Arousal/Alertness: Awake/alert Behavior During Therapy: WFL for tasks assessed/performed Overall Cognitive Status: Within Functional Limits for tasks assessed                                     General Comments       Exercises     Shoulder Instructions      Home Living Family/patient expects to be discharged to:: Private residence Living Arrangements: Other relatives Available Help at Discharge: Family;Available PRN/intermittently  Bathroom Shower/Tub: Teacher, early years/pre: Handicapped height     Home Equipment: Environmental consultant - 2 wheels;Cane - single point   Additional Comments: uses cane; has BSC      Prior Functioning/Environment Level of Independence: Needs assistance    ADL's / Homemaking Assistance Needed: niece assists with adls            OT Problem List: Decreased strength;Decreased activity tolerance;Impaired balance (sitting and/or standing);Decreased knowledge of use of DME or AE;Pain;Obesity      OT Treatment/Interventions: Self-care/ADL training;Energy conservation;DME and/or AE instruction;Patient/family education;Balance  training;Therapeutic activities;Therapeutic exercise    OT Goals(Current goals can be found in the care plan section) Acute Rehab OT Goals Patient Stated Goal: home OT Goal Formulation: With patient Time For Goal Achievement: 11/06/17 Potential to Achieve Goals: Good ADL Goals Pt Will Transfer to Toilet: with min guard assist;ambulating;bedside commode Pt Will Perform Toileting - Clothing Manipulation and hygiene: with min guard assist;with adaptive equipment;sit to/from stand Additional ADL Goal #1: pt will be able to stand for 2 minutes with min guard during adls Additional ADL Goal #2: pt will perform level one theraband exercises independently with written HEP  OT Frequency: Min 2X/week   Barriers to D/C:            Co-evaluation              AM-PAC PT "6 Clicks" Daily Activity     Outcome Measure Help from another person eating meals?: None Help from another person taking care of personal grooming?: A Little Help from another person toileting, which includes using toliet, bedpan, or urinal?: A Lot Help from another person bathing (including washing, rinsing, drying)?: A Lot Help from another person to put on and taking off regular upper body clothing?: A Lot Help from another person to put on and taking off regular lower body clothing?: Total 6 Click Score: 14   End of Session Nurse Communication: Mobility status  Activity Tolerance: Patient tolerated treatment well Patient left: in chair;with call bell/phone within reach;with chair alarm set  OT Visit Diagnosis: Muscle weakness (generalized) (M62.81)                Time: 4403-4742 OT Time Calculation (min): 37 min Charges:  OT General Charges $OT Visit: 1 Visit OT Evaluation $OT Eval Moderate Complexity: 1 Mod OT Treatments $Self Care/Home Management : 8-22 mins G-Codes:     [place], OTR/L [phone] 10/23/2017  Sion Thane 10/23/2017, 11:20 AM

## 2017-10-23 NOTE — Progress Notes (Signed)
Patient called out c/o sudden right sided chest heaviness and tightness. BP 124/79, HR 79, O2 sats read 89% on 3 L. I believe this was due to poor circulation. Placed patient on 4 L and sats increased to 100%. NP on call notified. New orders placed. EKG obtained. RT notified for neb tx. Patient given neb tx and placed on CPAP at 2 L, sats 100%. Patient states heaviness and tightness in chest has improved. Will continue to monitor closely.

## 2017-10-23 NOTE — Progress Notes (Signed)
   10/23/17 1400  OT Visit Information  Last OT Received On 10/23/17  Assistance Needed +2  History of Present Illness 62 yo female admitted with fall at home, UTI, acute encephalopathy, hallucinations. Hx of depression, bipolar, MI, PTSD, CHF, OA, morbid obesity, COPD-O2 dep, back pain  Precautions  Precautions Fall  Precaution Comments O2 dep  Pain Assessment  Pain Assessment No/denies pain  Cognition  Arousal/Alertness Awake/alert  Behavior During Therapy WFL for tasks assessed/performed  Overall Cognitive Status Within Functional Limits for tasks assessed  Upper Extremity Assessment  Upper Extremity Assessment Generalized weakness  ADL  Toilet Transfer Minimal assistance;Stand-pivot;RW (chair to bed)  Bed Mobility  Sit to supine Min assist;Mod assist  General bed mobility comments assist for bil LEs  Restrictions  Weight Bearing Restrictions No  Transfers  Equipment used Rolling walker (2 wheeled)  Sit to Stand Min assist  Stand pivot transfers Min assist  General transfer comment assist to rise  Exercises  Exercises Other exercises  Other Exercises  Other Exercises issued level one theraband.  Pt performed 5 reps of FF and 10 of horizontal ABD.  Cues for arm position during FF  OT - End of Session  Activity Tolerance Patient tolerated treatment well  Patient left in bed;with call bell/phone within reach;with bed alarm set;with family/visitor present  OT Assessment/Plan  OT Frequency (ACUTE ONLY) Min 2X/week  Follow Up Recommendations Home health OT  OT Equipment None recommended by OT  AM-PAC OT "6 Clicks" Daily Activity Outcome Measure  Help from another person eating meals? 4  Help from another person taking care of personal grooming? 3  Help from another person toileting, which includes using toliet, bedpan, or urinal? 2  Help from another person bathing (including washing, rinsing, drying)? 2  Help from another person to put on and taking off regular upper body  clothing? 2  Help from another person to put on and taking off regular lower body clothing? 1  6 Click Score 14  ADL G Code Conversion CK  Acute Rehab OT Goals  Patient Stated Goal home  OT Time Calculation  OT Start Time (ACUTE ONLY) 1349  OT Stop Time (ACUTE ONLY) 1358  OT Time Calculation (min) 9 min  OT General Charges  $OT Visit 1 Visit  OT Treatments  $Therapeutic Exercise 8-22 mins  Lesle Chris, OTR/L [phone] 10/23/2017

## 2017-10-23 NOTE — Progress Notes (Signed)
Pt. set up with CPAP and M/L nasal mask, placed Oxygen inline at 2 lpm, humidifier filled with sterile water, tolerating well, RN aware.

## 2017-10-23 NOTE — Progress Notes (Signed)
PROGRESS NOTE    Allison Sharp  GEZ:[id number] DOB: 02/01/1956 DOA: 10/21/2017 PCP: Dettinger, Fransisca Kaufmann, MD    Brief Narrative:  62 year old female with a history of hypertension, depression, psychosis, anxiety, bipolar 1 disorder, PTSD, asthma, COPD, coronary artery disease, chronic diastolic heart failure, GERD, hypertension, dyslipidemia, hypothyroidism, obstructive sleep apnea on CPAP, recently admitted between 5/1- 5/5 for urinary tract infection, who presents to the ED on 5/26 with altered mental status, confusion.  Assessment & Plan: AMS - 2ary to infectious etiology - Alert to person but not place or time. - continue IV antibiotics  Active Problems:   UTI (urinary tract infection) - Pt on Rocephin will continue - urine culture inconclusive   GERD - stable  CAD - pt on statin, no chest pain reported.  Hypothyroidism - continue synthroid.  Asthma - stable  DVT prophylaxis: Lovenox Code Status: Full Family Communication: none at bedside. Disposition Plan: pending improvement in condition  Consultants:   none   Procedures: none    Antimicrobials: Rocephin  Subjective: Pt has no new complaints, no acute issues overnight.   Objective: Vitals:   10/23/17 0029 10/23/17 0128 10/23/17 0402 10/23/17 0916  BP:   102/67   Pulse:   89   Resp:   17   Temp:   98 F (36.7 C)   TempSrc:   Oral   SpO2: 100% 98% 93% 98%  Weight:      Height:        Intake/Output Summary (Last 24 hours) at 10/23/2017 1358 Last data filed at 10/23/2017 0600 Gross per 24 hour  Intake 2496.25 ml  Output -  Net 2496.25 ml   Filed Weights   10/21/17 2239  Weight: (!) 181.4 kg (400 lb)    Examination:  General exam: Appears calm and comfortable, in nad.   Respiratory system: Clear to auscultation. Respiratory effort normal. No wheezes Cardiovascular system: S1 & S2 heard, RRR. No JVD, murmurs, rubs, gallops Gastrointestinal system: Abdomen is nondistended, soft and  nontender. No organomegaly or masses felt. Normal bowel sounds heard. Central nervous system: Alert and oriented x 1 to person, but no place or time. No focal neurological deficits. Extremities: Symmetric 5 x 5 power. Skin: No rashes, lesions or ulcers, on limited exam. Psychiatry:  Mood & affect appropriate.   Data Reviewed: I have personally reviewed following labs and imaging studies  CBC: Recent Labs  Lab 10/22/17 0044 10/23/17 0409  WBC 7.6 6.1  NEUTROABS 5.0  --   HGB 11.3* 10.4*  HCT 33.4* 31.2*  MCV 92.3 94.0  PLT 93* 92*   Basic Metabolic Panel: Recent Labs  Lab 10/22/17 0044 10/23/17 0409  NA 135 139  K 3.5 3.4*  CL 97* 103  CO2 28 26  GLUCOSE 114* 119*  BUN 17 10  CREATININE 1.01* 0.86  CALCIUM 9.3 9.1   GFR: Estimated Creatinine Clearance: 118.7 mL/min (by C-G formula based on SCr of 0.86 mg/dL). Liver Function Tests: Recent Labs  Lab 10/22/17 0044 10/23/17 0409  AST 15 17  ALT 10* 10*  ALKPHOS 49 44  BILITOT 0.8 0.5  PROT 6.5 6.0*  ALBUMIN 3.6 3.2*   No results for input(s): LIPASE, AMYLASE in the last 168 hours. No results for input(s): AMMONIA in the last 168 hours. Coagulation Profile: No results for input(s): INR, PROTIME in the last 168 hours. Cardiac Enzymes: Recent Labs  Lab 10/22/17 0044  TROPONINI <0.03   BNP (last 3 results) No results for input(s): PROBNP in the  last 8760 hours. HbA1C: Recent Labs    10/22/17 0811  HGBA1C 5.4   CBG: No results for input(s): GLUCAP in the last 168 hours. Lipid Profile: No results for input(s): CHOL, HDL, LDLCALC, TRIG, CHOLHDL, LDLDIRECT in the last 72 hours. Thyroid Function Tests: No results for input(s): TSH, T4TOTAL, FREET4, T3FREE, THYROIDAB in the last 72 hours. Anemia Panel: No results for input(s): VITAMINB12, FOLATE, FERRITIN, TIBC, IRON, RETICCTPCT in the last 72 hours. Sepsis Labs: No results for input(s): PROCALCITON, LATICACIDVEN in the last 168 hours.  Recent Results  (from the past 240 hour(s))  Urine Culture     Status: Abnormal   Collection Time: 10/21/17 11:14 PM  Result Value Ref Range Status   Specimen Description   Final    URINE, RANDOM Performed at [facility] [address], [place], [place] [address]    Special Requests   Final    NONE Performed at [facility], [place] [address], [place], [place] [address]    Culture (A)  Final    <10,000 COLONIES/mL INSIGNIFICANT GROWTH Performed at [facility] [address], [place], [place] [address]    Report Status 10/23/2017 FINAL  Final         Radiology Studies: Ct Abdomen Pelvis Wo Contrast  Result Date: 10/22/2017 CLINICAL DATA:  Altered mental status, hallucinations. Recent urinary tract infection. EXAM: CT ABDOMEN AND PELVIS WITHOUT CONTRAST TECHNIQUE: Multidetector CT imaging of the abdomen and pelvis was performed following the standard protocol without IV contrast. COMPARISON:  03/12/2015 FINDINGS: Lower chest: No acute abnormality. Hepatobiliary: No focal liver abnormality is seen. Mildly lobular contour of the liver. Status post cholecystectomy. No biliary dilatation. Pancreas: Predominantly fat replaced. Diffuse fat stranding in the central mesentery, surrounding the pancreatic head. Spleen: Normal in size without focal abnormality. Adrenals/Urinary Tract: 2.1 cm left adrenal adenoma. Normal appearance of the right adrenal gland. 2 mm nonobstructive right lower pole renal calculus. Bilateral perinephric stranding, nonspecific. No evidence of hydronephrosis. Normal appearance of the urinary bladder. Stomach/Bowel: Stomach is within normal limits. Appendix appears normal. No evidence of bowel wall thickening, distention, or inflammatory changes. Vascular/Lymphatic: Mild aortic atherosclerosis. No enlarged abdominal or pelvic lymph nodes. Reproductive: Status post hysterectomy. No adnexal masses. Other: No abdominal wall hernia or abnormality. No  abdominopelvic ascites. Musculoskeletal: Lower lumbosacral spine fusion without evidence of hardware failure. IMPRESSION: Diffuse upper abdominal central mesentery and peripancreatic fat stranding. Mildly lobular appearance of the liver. Splenic varices. Please correlate to pancreatic and liver enzymes regarding presence of acute pancreatitis and chronic liver disease. Electronically Signed   By: Fidela Salisbury M.D.   On: 10/22/2017 12:29   Dg Chest 2 View  Result Date: 10/22/2017 CLINICAL DATA:  Altered mental status. EXAM: CHEST - 2 VIEW COMPARISON:  08/01/2017 FINDINGS: Normal heart size and pulmonary vascularity. No focal airspace disease or consolidation in the lungs. No blunting of costophrenic angles. No pneumothorax. Mediastinal contours appear intact. Degenerative changes in the spine and shoulders. IMPRESSION: No active cardiopulmonary disease. Electronically Signed   By: Lucienne Capers M.D.   On: 10/22/2017 00:23   Ct Head Wo Contrast  Result Date: 10/21/2017 CLINICAL DATA:  Fall with head impact. EXAM: CT HEAD WITHOUT CONTRAST CT CERVICAL SPINE WITHOUT CONTRAST TECHNIQUE: Multidetector CT imaging of the head and cervical spine was performed following the standard protocol without intravenous contrast. Multiplanar CT image reconstructions of the cervical spine were also generated. COMPARISON:  Head CT 09/26/2017 FINDINGS: CT HEAD FINDINGS Brain: There  is no mass, hemorrhage or extra-axial collection. The size and configuration of the ventricles and extra-axial CSF spaces are normal. There is no acute or chronic infarction. There is hypoattenuation of the periventricular white matter, most commonly indicating chronic ischemic microangiopathy. Vascular: No abnormal hyperdensity of the major intracranial arteries or dural venous sinuses. No intracranial atherosclerosis. Skull: The visualized skull base, calvarium and extracranial soft tissues are normal. Sinuses/Orbits: No fluid levels or  advanced mucosal thickening of the visualized paranasal sinuses. No mastoid or middle ear effusion. The orbits are normal. CT CERVICAL SPINE FINDINGS Alignment: No static subluxation. Facets are aligned. Occipital condyles are normally positioned. Skull base and vertebrae: No acute fracture. Soft tissues and spinal canal: No prevertebral fluid or swelling. No visible canal hematoma. Disc levels: No advanced spinal canal or neural foraminal stenosis. Upper chest: No pneumothorax, pulmonary nodule or pleural effusion. Other: Normal visualized paraspinal cervical soft tissues. IMPRESSION: 1. Chronic ischemic microangiopathy without acute intracranial abnormality. 2. No acute fracture or static subluxation of the cervical spine. Electronically Signed   By: Ulyses Jarred M.D.   On: 10/21/2017 23:53   Ct Cervical Spine Wo Contrast  Result Date: 10/21/2017 CLINICAL DATA:  Fall with head impact. EXAM: CT HEAD WITHOUT CONTRAST CT CERVICAL SPINE WITHOUT CONTRAST TECHNIQUE: Multidetector CT imaging of the head and cervical spine was performed following the standard protocol without intravenous contrast. Multiplanar CT image reconstructions of the cervical spine were also generated. COMPARISON:  Head CT 09/26/2017 FINDINGS: CT HEAD FINDINGS Brain: There is no mass, hemorrhage or extra-axial collection. The size and configuration of the ventricles and extra-axial CSF spaces are normal. There is no acute or chronic infarction. There is hypoattenuation of the periventricular white matter, most commonly indicating chronic ischemic microangiopathy. Vascular: No abnormal hyperdensity of the major intracranial arteries or dural venous sinuses. No intracranial atherosclerosis. Skull: The visualized skull base, calvarium and extracranial soft tissues are normal. Sinuses/Orbits: No fluid levels or advanced mucosal thickening of the visualized paranasal sinuses. No mastoid or middle ear effusion. The orbits are normal. CT CERVICAL  SPINE FINDINGS Alignment: No static subluxation. Facets are aligned. Occipital condyles are normally positioned. Skull base and vertebrae: No acute fracture. Soft tissues and spinal canal: No prevertebral fluid or swelling. No visible canal hematoma. Disc levels: No advanced spinal canal or neural foraminal stenosis. Upper chest: No pneumothorax, pulmonary nodule or pleural effusion. Other: Normal visualized paraspinal cervical soft tissues. IMPRESSION: 1. Chronic ischemic microangiopathy without acute intracranial abnormality. 2. No acute fracture or static subluxation of the cervical spine. Electronically Signed   By: Ulyses Jarred M.D.   On: 10/21/2017 23:53    Scheduled Meds: . atorvastatin  20 mg Oral q1800  . benztropine  1 mg Oral QHS  . cloNIDine  0.1 mg Oral BID  . divalproex  1,500 mg Oral QHS  . DULoxetine  60 mg Oral Daily  . enoxaparin (LOVENOX) injection  80 mg Subcutaneous Q24H  . fluticasone furoate-vilanterol  1 puff Inhalation Daily  . levothyroxine  125 mcg Oral Q0600  . loratadine  10 mg Oral Daily  . pantoprazole  40 mg Oral Daily  . risperidone  4 mg Oral QHS   Continuous Infusions: . sodium chloride 75 mL/hr at 10/23/17 0132  . cefTRIAXone (ROCEPHIN)  IV Stopped (10/22/17 2214)     LOS: 1 day    Time spent: 39 min  Velvet Bathe, MD [facility] Pager [phone]  If 7PM-7AM, please contact night-coverage [email or web address] Password [email or web address] 10/23/2017, 1:58 PM

## 2017-10-23 NOTE — Progress Notes (Signed)
Pt. unable to tolerate nasal mask, wears nasal pillows at home, aware to notify.

## 2017-10-23 NOTE — Consult Note (Signed)
   [facility] CM Inpatient Consult   10/23/2017  ROSAMAE ROCQUE 04-19-1956 [id number]   Patient screened for potential [facility] Care Management program due to hospital readmission.   Went to bedside to speak with Ms. Chumley and niece [name] about [place] Management program. Ms. Kassem confirms she lives in [facility]. Denies needing [facility] Care Management Telephonic RNCM follow up. Made aware that Ms. Rispoli will likely receive post EMMI discharge call however.    Denies having concerns with medications, transportation, or disease management.   [name] (niece) states they are interested in outpatient physical therapy rather than home health. Made niece and patient aware that writer will pass this information along to inpatient RNCM.   Confirmed Primary Care MD is Dr. Warrick Parisian ([place] is listed as doing transition of care calls post discharge).  Provided [facility] Care Management brochure with 24-hr nurse advice line magnet.   Made inpatient RNCM aware of above notes and that [place] Management telephonic follow up was declined.     Marthenia Rolling, MSN-Ed, RN,BSN [facility] Liaison [phone]

## 2017-10-24 DIAGNOSIS — E039 Hypothyroidism, unspecified: Secondary | ICD-10-CM

## 2017-10-24 DIAGNOSIS — R440 Auditory hallucinations: Secondary | ICD-10-CM

## 2017-10-24 DIAGNOSIS — F315 Bipolar disorder, current episode depressed, severe, with psychotic features: Secondary | ICD-10-CM

## 2017-10-24 DIAGNOSIS — K760 Fatty (change of) liver, not elsewhere classified: Secondary | ICD-10-CM

## 2017-10-24 DIAGNOSIS — I951 Orthostatic hypotension: Secondary | ICD-10-CM

## 2017-10-24 MED ORDER — POTASSIUM CHLORIDE CRYS ER 20 MEQ PO TBCR
40.0000 meq | EXTENDED_RELEASE_TABLET | Freq: Once | ORAL | Status: AC
  Administered 2017-10-24: 40 meq via ORAL
  Filled 2017-10-24: qty 2

## 2017-10-24 MED ORDER — SODIUM CHLORIDE 0.9 % IV SOLN
2.0000 g | INTRAVENOUS | Status: DC
  Filled 2017-10-24: qty 20

## 2017-10-24 NOTE — Plan of Care (Signed)
Pt continuing to improve

## 2017-10-24 NOTE — Progress Notes (Signed)
Occupational Therapy Treatment Patient Details Name: Allison Sharp MRN: [id number] DOB: April 21, 1956 Today's Date: 10/24/2017    History of present illness 62 yo female admitted with fall at home, UTI, acute encephalopathy, hallucinations. Hx of depression, bipolar, MI, PTSD, CHF, OA, morbid obesity, COPD-O2 dep, back pain   OT comments  Ambulated to bathroom and used commode; written level one theraband program given to pt; she was able to return demonstrate this  Follow Up Recommendations  Home health OT    Equipment Recommendations  None recommended by OT    Recommendations for Other Services      Precautions / Restrictions Precautions Precautions: Fall Precaution Comments: O2 dep Restrictions Weight Bearing Restrictions: No       Mobility Bed Mobility               General bed mobility comments: pt in recliner on arrival  Transfers Overall transfer level: Needs assistance Equipment used: Rolling walker (2 wheeled) Transfers: Sit to/from Stand Sit to Stand: Min guard         General transfer comment: for safety    Balance                                           ADL either performed or assessed with clinical judgement   ADL       Grooming: Min guard;Standing;Wash/dry Geophysical data processor Transfer: Min guard;+2 for safety/equipment;Ambulation;Comfort height toilet;RW;Grab bars             General ADL Comments: ambulated to bathroom and used toilet then washed her hands; min guard +2 safety     Vision       Perception     Praxis      Cognition Arousal/Alertness: Awake/alert Behavior During Therapy: WFL for tasks assessed/performed Overall Cognitive Status: Within Functional Limits for tasks assessed                                          Exercises Other Exercises Other Exercises: added to written HEP and pt demonstrated exercises:  elbow flexion, extension and shoulder  extension.  Pt already performing FF and horizontal abduction   Shoulder Instructions       General Comments      Pertinent Vitals/ Pain       Pain Assessment: No/denies pain  Home Living                                          Prior Functioning/Environment              Frequency  Min 2X/week        Progress Toward Goals  OT Goals(current goals can now be found in the care plan section)  Progress towards OT goals: Progressing toward goals  Acute Rehab OT Goals Patient Stated Goal: home  Plan      Co-evaluation                 AM-PAC PT "6 Clicks" Daily Activity     Outcome Measure   Help from another person eating meals?: None Help  from another person taking care of personal grooming?: A Little Help from another person toileting, which includes using toliet, bedpan, or urinal?: A Lot Help from another person bathing (including washing, rinsing, drying)?: A Lot Help from another person to put on and taking off regular upper body clothing?: A Lot Help from another person to put on and taking off regular lower body clothing?: Total 6 Click Score: 14    End of Session        Activity Tolerance Patient tolerated treatment well   Patient Left in chair;with call bell/phone within reach;with chair alarm set   Nurse Communication          Time: 6195-0932 OT Time Calculation (min): 22 min  Charges: OT General Charges $OT Visit: 1 Visit OT Treatments $Self Care/Home Management : 8-22 mins  Lesle Chris, OTR/L [phone] 10/24/2017   [place] 10/24/2017, 12:32 PM

## 2017-10-24 NOTE — Discharge Instructions (Signed)
Please wear support sock (compression socks) to prevent swelling of the legs. Keep legs elevated at the level of your chest to prevent swelling of the legs.  You were cared for by a hospitalist during your hospital stay. If you have any questions about your discharge medications or the care you received while you were in the hospital after you are discharged, you can call the unit and asked to speak with the hospitalist on call if the hospitalist that took care of you is not available. Once you are discharged, your primary care physician will handle any further medical issues. Please note that NO REFILLS for any discharge medications will be authorized once you are discharged, as it is imperative that you return to your primary care physician (or establish a relationship with a primary care physician if you do not have one) for your aftercare needs so that they can reassess your need for medications and monitor your lab values.  Please take all your medications with you for your next visit with your Primary MD. Please ask your Primary MD to get all Hospital records sent to his/her office. Please request your Primary MD to go over all hospital test results at the follow up.    If you experience worsening of your admission symptoms, develop shortness of breath, chest pain, suicidal or homicidal thoughts or a life threatening emergency, you must seek medical attention immediately by calling 911 or calling your MD.   Dennis Bast must read the complete instructions/literature along with all the possible adverse reactions/side effects for all the medicines you take including new medications that have been prescribed to you. Take new medicines after you have completely understood and accpet all the possible adverse reactions/side effects.    Do not drive when taking pain medications or sedatives.     Do not take more than prescribed Pain, Sleep and Anxiety Medications   If you have smoked or chewed Tobacco in the  last 2 yrs please stop. Stop any regular alcohol  and or recreational drug use.   Wear Seat belts while driving.

## 2017-10-24 NOTE — Discharge Summary (Signed)
Physician Discharge Summary  Allison Sharp YQM:[id number] DOB: 10/03/1955 DOA: 10/21/2017  PCP: Dettinger, Fransisca Kaufmann, MD  Admit date: 10/21/2017 Discharge date: 10/24/2017  Admitted From: home  Disposition:  Home    Recommendations for Outpatient Follow-up:  OUPT PT./OT to be arranged by case management prior to d/c  1. Avoid diuretics 2. Check orthostatic vitals at next visit please 3. Support stocking and elevation to prevent pedal edema 4. Follow for weight loss   Discharge Condition:  stable   CODE STATUS:  Full code   Diet recommendation:  Heart healthy Consultations:  none    Discharge Diagnoses:  Principal Problem:   Orthostatic hypotension Active Problems:    Bipolar I disorder, most recent episode depressed, severe w psychosis ([place])   Auditory and visual hallucinations   Fatty liver   Hypothyroidism   Morbid obesity ([place])   GERD   Coronary artery disease   Essential hypertension, benign   Severe obesity (BMI >= 40) ([place])   Brief Narrative:  Allison Sharp with a history of hypertension, depression, psychosis, anxiety, bipolar 1 disorder, PTSD, asthma, COPD, coronary artery disease, chronic diastolic heart failure, GERD, hypertension, dyslipidemia, hypothyroidism, obstructive sleep apnea on CPAP, recently admitted between 5/1- 5/5 for urinary tract infection. She was brought into the hospital by family after tripping over her oxygen cord.  It was noted that she was having hallucinations and it was suspected that she may have a UTI.  UA showed moderate leukocytes rare bacteria negative nitrites.  She was admitted and started on IV Rocephin  She was orthostatic in the ED with BP dropping from 127/76 to 93/78 and HR jumped from 101- 127.  Subjective: Patient states that she is incontinent of urine and wears depends at home. She has hallucinations both auditory and visual at baseline. No complaints. I have had an extensive conversation with the patient's niece as  well.    Assessment & Plan:   Principal Problem:   Orthostatic hypotension - has been hydrated and orthostatic vitals are now negative- will stop HCTZ and Lisinopril as she remains hypotensive without both-  - she is on Clonidine at bedtime per her niece to help her sleep- I will not d/c this -cont to follow BP at home- they have a blood pressure cuff - will f/u with PCP in 1 wk  Active Problems:   Bipolar I disorder, most recent episode depressed, severe w psychosis ([place])   Visual and Auditory hallucinations - she is not confused - hallucinations are a chronic issue per patient and niece- she has Psych appt for June 6th - cont Risperdal, Depakote and cymbalta    Fatty liver   Morbid obesity   - working on weight loss with dietary changes    GERD - cont PPI  Chronic pedal edema - per niece they have an appt with the lymphedema clinic - no edema noted in the hospital- recommend support hose until then - no need for diuretics     Discharge Exam: Vitals:   10/24/17 1106 10/24/17 1241  BP: (!) 103/56 106/70  Pulse: (!) 113 79  Resp:  18  Temp:  98.3 F (36.8 C)  SpO2:  96%   Vitals:   10/24/17 0424 10/24/17 1047 10/24/17 1106 10/24/17 1241  BP: (!) 96/55  (!) 103/56 106/70  Pulse: 79 77 (!) 113 79  Resp: 18 18  18   Temp: 97.8 F (36.6 C)   98.3 F (36.8 C)  TempSrc: Oral   Oral  SpO2:  100% 97%  96%  Weight:      Height:        General: Pt is alert, awake, not in acute distress Cardiovascular: RRR, S1/S2 +, no rubs, no gallops Respiratory: CTA bilaterally, no wheezing, no rhonchi Abdominal: Soft, NT, ND, bowel sounds + Extremities: no edema, no cyanosis   Discharge Instructions  Discharge Instructions    Diet - low sodium heart healthy   Complete by:  As directed    Increase activity slowly   Complete by:  As directed      Allergies as of 10/24/2017      Reactions   Abilify [aripiprazole] Other (See Comments)   hallucinations   Naproxen Nausea And  Vomiting, Swelling   Ativan [lorazepam] Other (See Comments)   Delirium   Haldol [haloperidol] Other (See Comments)   Hallucinating    Hydroxyzine Other (See Comments)   hallucinations      Medication List    STOP taking these medications   hydrochlorothiazide 25 MG tablet Commonly known as:  HYDRODIURIL   lisinopril-hydrochlorothiazide 20-25 MG tablet Commonly known as:  PRINZIDE,ZESTORETIC     TAKE these medications   albuterol (2.5 MG/3ML) 0.083% nebulizer solution Commonly known as:  PROVENTIL Take 3 mLs (2.5 mg total) by nebulization every 4 (four) hours as needed for wheezing or shortness of breath.   PROAIR HFA 108 (90 Base) MCG/ACT inhaler Generic drug:  albuterol USE 2 PUFF EVERY 4 HOURS AS NEEDED FOR WHEEZING OR SHORTNESS OF BREATH   atorvastatin 20 MG tablet Commonly known as:  LIPITOR Take 1 tablet (20 mg total) by mouth daily at 6 PM.   benztropine 1 MG tablet Commonly known as:  COGENTIN Take 1 tablet (1 mg total) by mouth at bedtime.   BREO ELLIPTA 100-25 MCG/INH Aepb Generic drug:  fluticasone furoate-vilanterol INHALE 1 PUFF INTO THE LUNGS DAILY What changed:    when to take this  reasons to take this   celecoxib 200 MG capsule Commonly known as:  CELEBREX Take 1 capsule (200 mg total) by mouth 2 (two) times daily.   cetirizine 10 MG tablet Commonly known as:  ZYRTEC TAKE 1 TABLET ONCE DAILY   cloNIDine 0.1 MG tablet Commonly known as:  CATAPRES Take 0.1 mg by mouth at bedtime.   diclofenac sodium 1 % Gel Commonly known as:  VOLTAREN Apply to affected area twice daily as needed What changed:    how much to take  how to take this  when to take this  reasons to take this  additional instructions   divalproex 500 MG DR tablet Commonly known as:  DEPAKOTE Take 3 tablets (1,500 mg total) by mouth at bedtime.   DULoxetine 60 MG capsule Commonly known as:  CYMBALTA Take 1 capsule (60 mg total) by mouth daily.   feeding  supplement (PRO-STAT SUGAR FREE 64) Liqd Take 30 mLs by mouth 2 (two) times daily.   fluticasone 50 MCG/ACT nasal spray Commonly known as:  FLONASE Place 1 spray 2 (two) times daily as needed into both nostrils for allergies or rhinitis.   levothyroxine 125 MCG tablet Commonly known as:  SYNTHROID, LEVOTHROID Take 1 tablet (125 mcg total) by mouth daily before breakfast.   nystatin cream Commonly known as:  MYCOSTATIN APPLY TO THE AFFECTED AREA THREE TIMES DAILY AS NEEDED .Marland KitchenFOR TOPICAL USE ONLY... What changed:  See the new instructions.   omeprazole 20 MG capsule Commonly known as:  PRILOSEC TAKE (1) CAPSULE DAILY   ondansetron 4 MG  disintegrating tablet Commonly known as:  ZOFRAN ODT Take 1 tablet (4 mg total) by mouth every 8 (eight) hours as needed for nausea or vomiting.   polyethylene glycol powder powder Commonly known as:  GLYCOLAX/MIRALAX Take 17 g by mouth 2 (two) times daily as needed. What changed:  reasons to take this   promethazine 25 MG tablet Commonly known as:  PHENERGAN Take 1 tablet (25 mg total) by mouth every 8 (eight) hours as needed for nausea or vomiting.   risperidone 4 MG tablet Commonly known as:  RISPERDAL Take 1 tablet (4 mg total) by mouth at bedtime.   traMADol 50 MG tablet Commonly known as:  ULTRAM Take 1 tablet (50 mg total) by mouth every 8 (eight) hours as needed for moderate pain.       Allergies  Allergen Reactions  . Abilify [Aripiprazole] Other (See Comments)    hallucinations  . Naproxen Nausea And Vomiting and Swelling  . Ativan [Lorazepam] Other (See Comments)    Delirium  . Haldol [Haloperidol] Other (See Comments)    Hallucinating   . Hydroxyzine Other (See Comments)    hallucinations     Procedures/Studies:    Ct Abdomen Pelvis Wo Contrast  Result Date: 10/22/2017 CLINICAL DATA:  Altered mental status, hallucinations. Recent urinary tract infection. EXAM: CT ABDOMEN AND PELVIS WITHOUT CONTRAST TECHNIQUE:  Multidetector CT imaging of the abdomen and pelvis was performed following the standard protocol without IV contrast. COMPARISON:  03/12/2015 FINDINGS: Lower chest: No acute abnormality. Hepatobiliary: No focal liver abnormality is seen. Mildly lobular contour of the liver. Status post cholecystectomy. No biliary dilatation. Pancreas: Predominantly fat replaced. Diffuse fat stranding in the central mesentery, surrounding the pancreatic head. Spleen: Normal in size without focal abnormality. Adrenals/Urinary Tract: 2.1 cm left adrenal adenoma. Normal appearance of the right adrenal gland. 2 mm nonobstructive right lower pole renal calculus. Bilateral perinephric stranding, nonspecific. No evidence of hydronephrosis. Normal appearance of the urinary bladder. Stomach/Bowel: Stomach is within normal limits. Appendix appears normal. No evidence of bowel wall thickening, distention, or inflammatory changes. Vascular/Lymphatic: Mild aortic atherosclerosis. No enlarged abdominal or pelvic lymph nodes. Reproductive: Status post hysterectomy. No adnexal masses. Other: No abdominal wall hernia or abnormality. No abdominopelvic ascites. Musculoskeletal: Lower lumbosacral spine fusion without evidence of hardware failure. IMPRESSION: Diffuse upper abdominal central mesentery and peripancreatic fat stranding. Mildly lobular appearance of the liver. Splenic varices. Please correlate to pancreatic and liver enzymes regarding presence of acute pancreatitis and chronic liver disease. Electronically Signed   By: Fidela Salisbury M.D.   On: 10/22/2017 12:29   Dg Chest 2 View  Result Date: 10/22/2017 CLINICAL DATA:  Altered mental status. EXAM: CHEST - 2 VIEW COMPARISON:  08/01/2017 FINDINGS: Normal heart size and pulmonary vascularity. No focal airspace disease or consolidation in the lungs. No blunting of costophrenic angles. No pneumothorax. Mediastinal contours appear intact. Degenerative changes in the spine and shoulders.  IMPRESSION: No active cardiopulmonary disease. Electronically Signed   By: Lucienne Capers M.D.   On: 10/22/2017 00:23   Ct Head Wo Contrast  Result Date: 10/21/2017 CLINICAL DATA:  Fall with head impact. EXAM: CT HEAD WITHOUT CONTRAST CT CERVICAL SPINE WITHOUT CONTRAST TECHNIQUE: Multidetector CT imaging of the head and cervical spine was performed following the standard protocol without intravenous contrast. Multiplanar CT image reconstructions of the cervical spine were also generated. COMPARISON:  Head CT 09/26/2017 FINDINGS: CT HEAD FINDINGS Brain: There is no mass, hemorrhage or extra-axial collection. The size and configuration of the ventricles  and extra-axial CSF spaces are normal. There is no acute or chronic infarction. There is hypoattenuation of the periventricular white matter, most commonly indicating chronic ischemic microangiopathy. Vascular: No abnormal hyperdensity of the major intracranial arteries or dural venous sinuses. No intracranial atherosclerosis. Skull: The visualized skull base, calvarium and extracranial soft tissues are normal. Sinuses/Orbits: No fluid levels or advanced mucosal thickening of the visualized paranasal sinuses. No mastoid or middle ear effusion. The orbits are normal. CT CERVICAL SPINE FINDINGS Alignment: No static subluxation. Facets are aligned. Occipital condyles are normally positioned. Skull base and vertebrae: No acute fracture. Soft tissues and spinal canal: No prevertebral fluid or swelling. No visible canal hematoma. Disc levels: No advanced spinal canal or neural foraminal stenosis. Upper chest: No pneumothorax, pulmonary nodule or pleural effusion. Other: Normal visualized paraspinal cervical soft tissues. IMPRESSION: 1. Chronic ischemic microangiopathy without acute intracranial abnormality. 2. No acute fracture or static subluxation of the cervical spine. Electronically Signed   By: Ulyses Jarred M.D.   On: 10/21/2017 23:53   Ct Head Wo  Contrast  Result Date: 09/26/2017 CLINICAL DATA:  Altered mental status. Increasing confusion over the last 2 weeks. Hallucinations. EXAM: CT HEAD WITHOUT CONTRAST TECHNIQUE: Contiguous axial images were obtained from the base of the skull through the vertex without intravenous contrast. COMPARISON:  10/21/2009 FINDINGS: Brain: No evidence of acute infarction, hemorrhage, hydrocephalus, extra-axial collection or mass lesion/mass effect. Mild cerebral atrophy. Vascular: No hyperdense vessel or unexpected calcification. Skull: Normal. Negative for fracture or focal lesion. Sinuses/Orbits: No acute finding. Other: None. IMPRESSION: No acute intracranial abnormalities.  Mild cerebral atrophy. Electronically Signed   By: Lucienne Capers M.D.   On: 09/26/2017 23:32   Ct Cervical Spine Wo Contrast  Result Date: 10/21/2017 CLINICAL DATA:  Fall with head impact. EXAM: CT HEAD WITHOUT CONTRAST CT CERVICAL SPINE WITHOUT CONTRAST TECHNIQUE: Multidetector CT imaging of the head and cervical spine was performed following the standard protocol without intravenous contrast. Multiplanar CT image reconstructions of the cervical spine were also generated. COMPARISON:  Head CT 09/26/2017 FINDINGS: CT HEAD FINDINGS Brain: There is no mass, hemorrhage or extra-axial collection. The size and configuration of the ventricles and extra-axial CSF spaces are normal. There is no acute or chronic infarction. There is hypoattenuation of the periventricular white matter, most commonly indicating chronic ischemic microangiopathy. Vascular: No abnormal hyperdensity of the major intracranial arteries or dural venous sinuses. No intracranial atherosclerosis. Skull: The visualized skull base, calvarium and extracranial soft tissues are normal. Sinuses/Orbits: No fluid levels or advanced mucosal thickening of the visualized paranasal sinuses. No mastoid or middle ear effusion. The orbits are normal. CT CERVICAL SPINE FINDINGS Alignment: No static  subluxation. Facets are aligned. Occipital condyles are normally positioned. Skull base and vertebrae: No acute fracture. Soft tissues and spinal canal: No prevertebral fluid or swelling. No visible canal hematoma. Disc levels: No advanced spinal canal or neural foraminal stenosis. Upper chest: No pneumothorax, pulmonary nodule or pleural effusion. Other: Normal visualized paraspinal cervical soft tissues. IMPRESSION: 1. Chronic ischemic microangiopathy without acute intracranial abnormality. 2. No acute fracture or static subluxation of the cervical spine. Electronically Signed   By: Ulyses Jarred M.D.   On: 10/21/2017 23:53     The results of significant diagnostics from this hospitalization (including imaging, microbiology, ancillary and laboratory) are listed below for reference.     Microbiology: Recent Results (from the past 240 hour(s))  Urine Culture     Status: Abnormal   Collection Time: 10/21/17 11:14 PM  Result  Value Ref Range Status   Specimen Description   Final    URINE, RANDOM Performed at [facility] [address], [place], [place] [address]    Special Requests   Final    NONE Performed at [facility], [place] [address] [place], [place] [address]    Culture (A)  Final    <10,000 COLONIES/mL INSIGNIFICANT GROWTH Performed at [facility] [address], [place], [place] [address]    Report Status 10/23/2017 FINAL  Final     Labs: BNP (last 3 results) No results for input(s): BNP in the last 8760 hours. Basic Metabolic Panel: Recent Labs  Lab 10/22/17 0044 10/23/17 0409  NA 135 139  K 3.5 3.4*  CL 97* 103  CO2 28 26  GLUCOSE 114* 119*  BUN 17 10  CREATININE 1.01* 0.86  CALCIUM 9.3 9.1   Liver Function Tests: Recent Labs  Lab 10/22/17 0044 10/23/17 0409  AST 15 17  ALT 10* 10*  ALKPHOS 49 44  BILITOT 0.8 0.5  PROT 6.5 6.0*  ALBUMIN 3.6 3.2*   No results for input(s): LIPASE, AMYLASE in the last 168  hours. No results for input(s): AMMONIA in the last 168 hours. CBC: Recent Labs  Lab 10/22/17 0044 10/23/17 0409  WBC 7.6 6.1  NEUTROABS 5.0  --   HGB 11.3* 10.4*  HCT 33.4* 31.2*  MCV 92.3 94.0  PLT 93* 92*   Cardiac Enzymes: Recent Labs  Lab 10/22/17 0044  TROPONINI <0.03   BNP: Invalid input(s): POCBNP CBG: No results for input(s): GLUCAP in the last 168 hours. D-Dimer Recent Labs    10/22/17 0044  DDIMER 0.43   Hgb A1c Recent Labs    10/22/17 0811  HGBA1C 5.4   Lipid Profile No results for input(s): CHOL, HDL, LDLCALC, TRIG, CHOLHDL, LDLDIRECT in the last 72 hours. Thyroid function studies No results for input(s): TSH, T4TOTAL, T3FREE, THYROIDAB in the last 72 hours.  Invalid input(s): FREET3 Anemia work up No results for input(s): VITAMINB12, FOLATE, FERRITIN, TIBC, IRON, RETICCTPCT in the last 72 hours. Urinalysis    Component Value Date/Time   COLORURINE YELLOW 10/21/2017 2314   APPEARANCEUR CLEAR 10/21/2017 2314   APPEARANCEUR Cloudy (A) 10/08/2017 1613   LABSPEC 1.015 10/21/2017 2314   PHURINE 6.0 10/21/2017 2314   GLUCOSEU NEGATIVE 10/21/2017 2314   GLUCOSEU NEGATIVE 07/01/2013 1119   HGBUR NEGATIVE 10/21/2017 2314   BILIRUBINUR NEGATIVE 10/21/2017 2314   BILIRUBINUR Negative 10/08/2017 1613   KETONESUR 5 (A) 10/21/2017 2314   PROTEINUR NEGATIVE 10/21/2017 2314   UROBILINOGEN 1.0 01/16/2015 1854   NITRITE NEGATIVE 10/21/2017 2314   LEUKOCYTESUR MODERATE (A) 10/21/2017 2314   LEUKOCYTESUR Trace (A) 10/08/2017 1613   LEUKOCYTESUR Small 07/01/2013 1647   Sepsis Labs Invalid input(s): PROCALCITONIN,  WBC,  LACTICIDVEN Microbiology Recent Results (from the past 240 hour(s))  Urine Culture     Status: Abnormal   Collection Time: 10/21/17 11:14 PM  Result Value Ref Range Status   Specimen Description   Final    URINE, RANDOM Performed at [facility], [place] [address], [place], [place] [address]    Special Requests    Final    NONE Performed at [facility], [place] [address] [place], [place] [address]    Culture (A)  Final    <10,000 COLONIES/mL INSIGNIFICANT GROWTH Performed at [facility] [address], [place], [place] [address]    Report Status 10/23/2017 FINAL  Final     Time coordinating discharge in minutes: 75  SIGNED:   Debbe Odea, MD  [facility] 10/24/2017, 3:08 PM Pager   If 7PM-7AM, please contact night-coverage [email or web address] Password [email or web address]

## 2017-10-24 NOTE — Progress Notes (Signed)
Physical Therapy Treatment Patient Details Name: Allison Allison Sharp MRN: [id number] DOB: 1955/10/18 Today's Date: 10/24/2017    History of Present Illness 62 yo female admitted with fall at home, UTI, acute encephalopathy, hallucinations. Hx of depression, bipolar, MI, PTSD, CHF, OA, morbid obesity, COPD-O2 dep, back pain    PT Comments    Pt progressing toward goals, incr gait/activity tolerance today; continue PT POC   Follow Up Recommendations  Home health PT;Supervision - Intermittent     Equipment Recommendations  None recommended by PT    Recommendations for Other Services       Precautions / Restrictions Precautions Precautions: Fall Precaution Comments: O2 dep Restrictions Weight Bearing Restrictions: No    Mobility  Bed Mobility               General bed mobility comments: pt in recliner on arrival  Transfers Overall transfer level: Needs assistance Equipment used: Rolling walker (2 wheeled) Transfers: Sit to/from Stand Sit to Stand: Min guard         General transfer comment: for safety  Ambulation/Gait Ambulation/Gait assistance: Min guard;+2 safety/equipment Ambulation Distance (Feet): 110 Feet Assistive device: Rolling walker (2 wheeled) Gait Pattern/deviations: Step-through pattern     General Gait Details: slow gait speed. No physical assist given but close guarding provided for safety. Remained on [place] O2; SpO2= 93%, HR 93   Stairs             Wheelchair Mobility    Modified Rankin (Stroke Patients Only)       Balance                                            Cognition Arousal/Alertness: Awake/alert Behavior During Therapy: WFL for tasks assessed/performed Overall Cognitive Status: Within Functional Limits for tasks assessed                                        Exercises      General Comments        Pertinent Vitals/Pain Pain Assessment: No/denies pain    Home Living                       Prior Function            PT Goals (current goals can now be found in the care plan section) Acute Rehab PT Goals Patient Stated Goal: home PT Goal Formulation: With patient Time For Goal Achievement: 11/05/17 Potential to Achieve Goals: Good Progress towards PT goals: Progressing toward goals    Frequency    Min 3X/week      PT Plan Current plan remains appropriate    Co-evaluation              AM-PAC PT "6 Clicks" Daily Activity  Outcome Measure  Difficulty turning over in bed (including adjusting bedclothes, sheets and blankets)?: A Lot Difficulty moving from lying on back to sitting on the side of the bed? : Unable Difficulty sitting down on and standing up from a chair with arms (e.g., wheelchair, bedside commode, etc,.)?: A Little Help needed moving to and from a bed to chair (including a wheelchair)?: A Little Help needed walking in hospital room?: A Little Help needed climbing 3-5 steps with a railing? : A Lot 6  Click Score: 14    End of Session Equipment Utilized During Treatment: Gait belt;Oxygen Activity Tolerance: Patient tolerated treatment well Patient left: in chair;with call bell/phone within reach;with chair alarm set   PT Visit Diagnosis: Difficulty in walking, not elsewhere classified (R26.2);Muscle weakness (generalized) (M62.81)     Time: 7425-9563 PT Time Calculation (min) (ACUTE ONLY): 22 min  Charges:  $Gait Training: 8-22 mins                    G CodesKenyon Ana, PT Pager: [phone] 10/24/2017    Kenyon Ana 10/24/2017, 12:24 PM

## 2017-10-24 NOTE — Care Management Important Message (Signed)
Important Message  Patient Details  Name: Allison Sharp MRN: [id number] Date of Birth: 1955-10-24   Medicare Important Message Given:  Yes    Kerin Salen 10/24/2017, 12:12 Elliott Message  Patient Details  Name: Allison Sharp MRN: [id number] Date of Birth: Apr 16, 1956   Medicare Important Message Given:  Yes    Kerin Salen 10/24/2017, 12:12 PM

## 2017-10-24 NOTE — Progress Notes (Signed)
Reviewed discharge information with patient and caregiver. Answered all questions. Patient/caregiver able to teach back medications and reasons to contact MD/911. Patient verbalizes importance of PCP follow up appointment. Script for outpatient PT/OT given to patient/caregiver. Barbee Shropshire. Brigitte Pulse, RN

## 2017-10-31 ENCOUNTER — Encounter: Payer: Self-pay | Admitting: Family Medicine

## 2017-10-31 ENCOUNTER — Ambulatory Visit (INDEPENDENT_AMBULATORY_CARE_PROVIDER_SITE_OTHER): Payer: Medicare Other | Admitting: Family Medicine

## 2017-10-31 VITALS — BP 130/80 | HR 66 | Temp 97.0°F | Ht 69.0 in | Wt 399.6 lb

## 2017-10-31 DIAGNOSIS — R296 Repeated falls: Secondary | ICD-10-CM | POA: Diagnosis not present

## 2017-10-31 DIAGNOSIS — I10 Essential (primary) hypertension: Secondary | ICD-10-CM

## 2017-10-31 DIAGNOSIS — I951 Orthostatic hypotension: Secondary | ICD-10-CM | POA: Diagnosis not present

## 2017-10-31 DIAGNOSIS — R531 Weakness: Secondary | ICD-10-CM

## 2017-10-31 MED ORDER — LEVOTHYROXINE SODIUM 125 MCG PO TABS: 125.0000 ug | ORAL_TABLET | Freq: Every day | ORAL | 0 refills | Status: DC

## 2017-10-31 MED ORDER — TRAMADOL HCL 50 MG PO TABS: 50.0000 mg | ORAL_TABLET | Freq: Three times a day (TID) | ORAL | 2 refills | Status: DC | PRN

## 2017-10-31 NOTE — Progress Notes (Addendum)
BP 130/80   Pulse 66   Temp (!) 97 F (36.1 C) (Oral)   Ht 5\' 9"  (1.753 m)   Wt (!) 399 lb 9.6 oz (181.3 kg)   LMP 02/18/1995   BMI 59.01 kg/m    Subjective:    Patient ID: Allison Sharp, female    DOB: 1955-12-22, 62 y.o.   MRN: [id number]  HPI: Allison Sharp is a 62 y.o. female presenting on 10/31/2017 for Hospitalization Follow-up (5/26 [facility] - fall)   HPI Lightheadedness and dizziness and recurrent falls and weakness Patient is coming in for a hospital follow-up for which she was admitted on 10/21/2017 to [facility] and had symptoms of lightheadedness and dizziness and falling and weakness.  She was discharged on 10/24/2017.  They found that she was orthostatic and that it may be due to her diuretics and her blood pressure there was on the lower end so they stopped her blood pressure medications that she was on.  She has been using the diuretics also help with some of her peripheral edema and they recommended support stockings which we did also recommended before but patient was noncompliant with these.  Since that hospitalization she denies any more lightheadedness or dizziness and her blood pressure today is improved at 130/80 manual.  She says she is feeling a lot better since stopping that blood pressure medication.  He also has been generally feeling weak and she feels like that has slightly improved.  She still feels somewhat weak and like she could fall because of the weakness and instability and would like to try physical therapy to help with this.  Relevant past medical, surgical, family and social history reviewed and updated as indicated. Interim medical history since our last visit reviewed. Allergies and medications reviewed and updated.  Review of Systems  Constitutional: Negative for chills and fever.  Eyes: Negative for redness and visual disturbance.  Respiratory: Negative for chest tightness and shortness of breath.   Cardiovascular: Negative for chest pain  and leg swelling.  Musculoskeletal: Negative for back pain and gait problem.  Skin: Negative for rash.  Neurological: Positive for dizziness and weakness. Negative for light-headedness, numbness and headaches.  Psychiatric/Behavioral: Negative for agitation and behavioral problems.  All other systems reviewed and are negative.   Per HPI unless specifically indicated above   Allergies as of 10/31/2017      Reactions   Abilify [aripiprazole] Other (See Comments)   hallucinations   Naproxen Nausea And Vomiting, Swelling   Ativan [lorazepam] Other (See Comments)   Delirium   Haldol [haloperidol] Other (See Comments)   Hallucinating    Hydroxyzine Other (See Comments)   hallucinations      Medication List        Accurate as of 10/31/17  2:55 PM. Always use your most recent med list.          albuterol (2.5 MG/3ML) 0.083% nebulizer solution Commonly known as:  PROVENTIL Take 3 mLs (2.5 mg total) by nebulization every 4 (four) hours as needed for wheezing or shortness of breath.   PROAIR HFA 108 (90 Base) MCG/ACT inhaler Generic drug:  albuterol USE 2 PUFF EVERY 4 HOURS AS NEEDED FOR WHEEZING OR SHORTNESS OF BREATH   atorvastatin 20 MG tablet Commonly known as:  LIPITOR Take 1 tablet (20 mg total) by mouth daily at 6 PM.   benztropine 1 MG tablet Commonly known as:  COGENTIN Take 1 tablet (1 mg total) by mouth at bedtime.  BREO ELLIPTA 100-25 MCG/INH Aepb Generic drug:  fluticasone furoate-vilanterol INHALE 1 PUFF INTO THE LUNGS DAILY   celecoxib 200 MG capsule Commonly known as:  CELEBREX Take 1 capsule (200 mg total) by mouth 2 (two) times daily.   cetirizine 10 MG tablet Commonly known as:  ZYRTEC TAKE 1 TABLET ONCE DAILY   cloNIDine 0.1 MG tablet Commonly known as:  CATAPRES Take 0.1 mg by mouth at bedtime.   diclofenac sodium 1 % Gel Commonly known as:  VOLTAREN Apply to affected area twice daily as needed   divalproex 500 MG DR tablet Commonly known  as:  DEPAKOTE Take 3 tablets (1,500 mg total) by mouth at bedtime.   DULoxetine 60 MG capsule Commonly known as:  CYMBALTA Take 1 capsule (60 mg total) by mouth daily.   fluticasone 50 MCG/ACT nasal spray Commonly known as:  FLONASE Place 1 spray 2 (two) times daily as needed into both nostrils for allergies or rhinitis.   levothyroxine 125 MCG tablet Commonly known as:  SYNTHROID, LEVOTHROID Take 1 tablet (125 mcg total) by mouth daily before breakfast.   nystatin cream Commonly known as:  MYCOSTATIN APPLY TO THE AFFECTED AREA THREE TIMES DAILY AS NEEDED .Marland KitchenFOR TOPICAL USE ONLY...   omeprazole 20 MG capsule Commonly known as:  PRILOSEC TAKE (1) CAPSULE DAILY   ondansetron 4 MG disintegrating tablet Commonly known as:  ZOFRAN ODT Take 1 tablet (4 mg total) by mouth every 8 (eight) hours as needed for nausea or vomiting.   polyethylene glycol powder powder Commonly known as:  GLYCOLAX/MIRALAX Take 17 g by mouth 2 (two) times daily as needed.   promethazine 25 MG tablet Commonly known as:  PHENERGAN Take 1 tablet (25 mg total) by mouth every 8 (eight) hours as needed for nausea or vomiting.   risperidone 4 MG tablet Commonly known as:  RISPERDAL Take 1 tablet (4 mg total) by mouth at bedtime.   traMADol 50 MG tablet Commonly known as:  ULTRAM Take 1 tablet (50 mg total) by mouth every 8 (eight) hours as needed for moderate pain.          Objective:    Pulse 66   Temp (!) 97 F (36.1 C) (Oral)   Ht 5\' 9"  (1.753 m)   Wt (!) 399 lb 9.6 oz (181.3 kg)   LMP 02/18/1995   BMI 59.01 kg/m   Wt Readings from Last 3 Encounters:  10/31/17 (!) 399 lb 9.6 oz (181.3 kg)  10/21/17 (!) 400 lb (181.4 kg)  10/08/17 (!) 399 lb (181 kg)    Physical Exam  Constitutional: She is oriented to person, place, and time. She appears well-developed and well-nourished. No distress.  Morbidly obese  Eyes: Conjunctivae are normal.  Cardiovascular: Normal rate, regular rhythm, normal  heart sounds and intact distal pulses.  No murmur heard. Pulmonary/Chest: Effort normal and breath sounds normal. No respiratory distress. She has no wheezes.  Musculoskeletal: Normal range of motion. She exhibits edema (Trace peripheral edema).  Neurological: She is alert and oriented to person, place, and time. Coordination normal.  Skin: Skin is warm and dry. No rash noted. She is not diaphoretic.  Psychiatric: She has a normal mood and affect. Her behavior is normal.  Nursing note and vitals reviewed.       Assessment & Plan:   Problem List Items Addressed This Visit      Cardiovascular and Mediastinum   Essential hypertension, benign (Chronic)   Orthostatic hypotension - Primary  Other   Severe obesity (BMI >= 40) (HCC)    Other Visit Diagnoses    Recurrent falls       Relevant Orders   Ambulatory referral to Physical Therapy   General weakness       Relevant Orders   Ambulatory referral to Physical Therapy      We will continue to hold her blood pressure medications, she will continue to monitor it at home with her machine and if she starts getting elevated blood pressures then we we will likely restart lisinopril without the hydrochlorothiazide to help with blood pressure without the diuretic.  She is not orthostatic here in office today.  Patient is very obese and cannot fit onto a normal size bed and needs a bariatric bed  Follow up plan: Return in about 3 months (around 01/31/2018), or if symptoms worsen or fail to improve, for Recheck blood pressure.  Counseling provided for all of the vaccine components Orders Placed This Encounter  Procedures  . Ambulatory referral to Physical Therapy    [name], MD [facility] 10/31/2017, 2:55 PM

## 2017-11-01 ENCOUNTER — Encounter (HOSPITAL_COMMUNITY): Payer: Self-pay | Admitting: Psychiatry

## 2017-11-01 ENCOUNTER — Ambulatory Visit (INDEPENDENT_AMBULATORY_CARE_PROVIDER_SITE_OTHER): Payer: Medicare Other | Admitting: Psychiatry

## 2017-11-01 ENCOUNTER — Encounter

## 2017-11-01 VITALS — BP 102/60 | HR 107 | Ht 69.0 in | Wt >= 6400 oz

## 2017-11-01 DIAGNOSIS — Z915 Personal history of self-harm: Secondary | ICD-10-CM

## 2017-11-01 DIAGNOSIS — F3160 Bipolar disorder, current episode mixed, unspecified: Secondary | ICD-10-CM

## 2017-11-01 DIAGNOSIS — Z811 Family history of alcohol abuse and dependence: Secondary | ICD-10-CM | POA: Diagnosis not present

## 2017-11-01 DIAGNOSIS — Z818 Family history of other mental and behavioral disorders: Secondary | ICD-10-CM

## 2017-11-01 MED ORDER — DIVALPROEX SODIUM 500 MG PO DR TAB: 1500.0000 mg | DELAYED_RELEASE_TABLET | Freq: Every day | ORAL | 0 refills | Status: DC

## 2017-11-01 MED ORDER — RISPERIDONE 4 MG PO TABS: 4.0000 mg | ORAL_TABLET | Freq: Every day | ORAL | 0 refills | Status: DC

## 2017-11-01 MED ORDER — BENZTROPINE MESYLATE 1 MG PO TABS: 1.0000 mg | ORAL_TABLET | Freq: Every day | ORAL | 0 refills | Status: DC

## 2017-11-01 MED ORDER — DULOXETINE HCL 60 MG PO CPEP: 60.0000 mg | ORAL_CAPSULE | Freq: Every day | ORAL | 0 refills | Status: DC

## 2017-11-01 NOTE — Progress Notes (Signed)
BH MD/PA/NP OP Progress Note  11/01/2017 4:32 PM Allison Sharp  MRN:  [id number]  Chief Complaint: I was hospitalized because of UTI but I am doing better.  HPI: Allison Sharp came for her follow-up appointment with her knees.  She has been hospitalized twice because of UTI and dehydration.  She was also having hallucination when she was diagnosed with UTI.  Her medicines were changed from Abilify to Risperdal because she was having tremors with Abilify.  She is doing much better.  Her niece endorse that she has more energy and she is more social and active and able to leave the house.  She has no more hallucination.  She still have chronic health issues including obesity, frequent fall, chronic pain and she uses oxygen.  Patient does not want to change her medication.  She denies any irritability, anger, mania, psychosis or any hallucination.  She really like combination of current medication.  She has no tremors since we added Cogentin.  She is not interested in counseling.  She is using CPAP machine.  Patient denies any paranoia or any aggression.  She is happy with her current living situation.  Visit Diagnosis:    ICD-10-CM   1. Mixed bipolar I disorder (HCC) F31.60 DULoxetine (CYMBALTA) 60 MG capsule    divalproex (DEPAKOTE) 500 MG DR tablet    risperidone (RISPERDAL) 4 MG tablet    benztropine (COGENTIN) 1 MG tablet    Past Psychiatric History: Reviewed. Patient has significant history of psychiatric illness with multiple psychiatric inpatient treatment. Her last admission was inJanuary 2018 at [facility]. She had history of suicidal attempt by taking overdose. She had diagnosed with PTSD, depression, bipolar disorder. In the past she had tried Paxil, Prozac, Wellbutrin, Effexor, Lexapro, amitriptyline, Cymbalta, abilify, Neurontin, trazodone, Thorazine, hydroxyzine, Luvox, Sinequan, Zyprexa and Latuda. She has best responded on Risperdal and Cymbalta.  Past Medical History:   Past Medical History:  Diagnosis Date  . Allergy   . Anxiety   . Arthritis   . Asthma   . Bipolar 1 disorder ([place])   . CAD (coronary artery disease)   . Cellulitis   . CHF (congestive heart failure) (HCC)    diastolic dysfunction  . Chronic back pain   . Chronic headaches   . Complication of anesthesia    States she typically gets sick s/p anesthesia  . Contusion of sacrum   . COPD (chronic obstructive pulmonary disease) ([place])   . Depression   . Dyspnea    PFT 03/05/09 FEV1 2.77(98%), FVC 3.25(86%), FEV1% 85, TLC 5.88(99%), DLCO 60% ,  Methacholine challenge 03/16/09 normal ,  CT chest 03/12/09 no pulmonary disease  . Fungal infection   . GERD (gastroesophageal reflux disease)   . History of colonoscopy 10/17/2002   by Dr Rehman-> distal non-specific proctitis, small ext hemorrhoids,   . HTN (hypertension)   . Hyperlipidemia   . Hyperthyroidism   . Hypothyroidism    States she only has hyperthyroidism  . Migraine headache   . Morbid obesity with body mass index of 50.0-59.9 in adult [facility]) JAN 2011 370 LBS   2004 311 BMI 45.9  . Myocardial infarction ([place])    NOV 1997  . OSA on CPAP    she had been on 2L O2 at night but that was stopped  . PONV (postoperative nausea and vomiting)   . PTSD (post-traumatic stress disorder)   . Suicidal ideation   . Urine incontinence   . Vitamin D deficiency  Past Surgical History:  Procedure Laterality Date  . ABDOMINAL HYSTERECTOMY     sept 1996  . APPENDECTOMY    . BACK SURGERY  2008  . CARDIAC CATHETERIZATION     nov 1997  . CHOLECYSTECTOMY    . COLONOSCOPY  10/17/2002    Distal proctitis, small external hemorrhoids, otherwise/  normal colonoscopy. Suspect rectal bleeding secondary to hemorrhoids  . ESOPHAGOGASTRODUODENOSCOPY  03/18/09   fundic gland polyps/mild gastritis  . HERNIA REPAIR  1978  . JOINT REPLACEMENT     bil knee replacement  . KNEE ARTHROSCOPY    . MULTIPLE EXTRACTIONS WITH ALVEOLOPLASTY N/A 08/16/2015    Procedure: EXTRACTION OF TEETH THREE, SIX, EIGHT, NINE, ELEVEN, FOURTEEN, FIFTEEN, TWENTY-EIGHT WITH ALVEOLOPLASTY;  Surgeon: Diona Browner, DDS;  Location: [facility];  Service: Oral Surgery;  Laterality: N/A;  . TONSILLECTOMY    . TOTAL VAGINAL HYSTERECTOMY    . TUBAL LIGATION      Family Psychiatric History: Reviewed.  Family History:  Family History  Problem Relation Age of Onset  . Hypertension Mother   . Bipolar disorder Mother   . Dementia Mother   . Depression Mother   . Heart attack Mother   . AAA (abdominal aortic aneurysm) Mother   . Coronary artery disease Father   . Alcohol abuse Father   . Hypertension Brother   . Coronary artery disease Brother   . Bipolar disorder Brother   . Depression Brother   . Depression Sister   . Paranoid behavior Sister   . Cancer Sister        breast  . Bipolar disorder Sister   . Depression Sister   . Hypertension Sister   . Cancer Son        thyroid  . Cancer Maternal Aunt        breast metastatized to brain  . Anesthesia problems Neg Hx   . Hypotension Neg Hx   . Malignant hyperthermia Neg Hx   . Pseudochol deficiency Neg Hx     Social History:  Social History   Socioeconomic History  . Marital status: Single    Spouse name: Not on file  . Number of children: 2  . Years of education: Not on file  . Highest education level: Not on file  Occupational History  . Occupation: Disabled    Fish farm manager: UNEMPLOYED    Comment: back problems  Social Needs  . Financial resource strain: Not on file  . Food insecurity:    Worry: Not on file    Inability: Not on file  . Transportation needs:    Medical: Not on file    Non-medical: Not on file  Tobacco Use  . Smoking status: Never Smoker  . Smokeless tobacco: Never Used  Substance and Sexual Activity  . Alcohol use: No    Alcohol/week: 0.0 oz  . Drug use: No  . Sexual activity: Never    Birth control/protection: Abstinence  Lifestyle  . Physical activity:    Days per week:  Not on file    Minutes per session: Not on file  . Stress: Not on file  Relationships  . Social connections:    Talks on phone: Not on file    Gets together: Not on file    Attends religious service: Not on file    Active member of club or organization: Not on file    Attends meetings of clubs or organizations: Not on file    Relationship status: Not on file  Other Topics  Concern  . Not on file  Social History Narrative   [name] is disabled and lives with her sister, Vallorie. She has another sister with whom she has lived with in the past, and who is a Designer, jewellery, but not currently practicing.   She has two grown sons that she does not see often, as they live in other states. She has 5 grand children.   [name] has a long history of mental illness including depression, PTSD, suicidal and homicidal ideation.   She has been obese most all of her life. Her weight has significantly impacted her QOL. She recently lost 20 lbs. By decreasing portion size & increasing proteins.     Allergies:  Allergies  Allergen Reactions  . Abilify [Aripiprazole] Other (See Comments)    hallucinations  . Naproxen Nausea And Vomiting and Swelling  . Ativan [Lorazepam] Other (See Comments)    Delirium  . Haldol [Haloperidol] Other (See Comments)    Hallucinating   . Hydroxyzine Other (See Comments)    hallucinations    Metabolic Disorder Labs: Lab Results  Component Value Date   HGBA1C 5.4 10/22/2017   MPG 108.28 10/22/2017   MPG 105 04/05/2017   No results found for: PROLACTIN Lab Results  Component Value Date   CHOL 236 (H) 07/02/2015   TRIG 181 (H) 07/02/2015   HDL 39 (L) 07/02/2015   CHOLHDL 6.1 (H) 07/02/2015   VLDL 30.0 12/10/2013   LDLCALC 161 (H) 07/02/2015   LDLCALC 89 12/10/2013   Lab Results  Component Value Date   TSH 5.634 (H) 09/27/2017   TSH 1.950 05/04/2017    Therapeutic Level Labs: No results found for: LITHIUM Lab Results  Component Value Date    VALPROATE 62 10/22/2017   VALPROATE 59 09/27/2017   No components found for:  CBMZ  Current Medications: Current Outpatient Medications  Medication Sig Dispense Refill  . albuterol (PROVENTIL) (2.5 MG/3ML) 0.083% nebulizer solution Take 3 mLs (2.5 mg total) by nebulization every 4 (four) hours as needed for wheezing or shortness of breath. 75 mL 0  . atorvastatin (LIPITOR) 20 MG tablet Take 1 tablet (20 mg total) by mouth daily at 6 PM. 30 tablet 0  . benztropine (COGENTIN) 1 MG tablet Take 1 tablet (1 mg total) by mouth at bedtime. 30 tablet 0  . BREO ELLIPTA 100-25 MCG/INH AEPB INHALE 1 PUFF INTO THE LUNGS DAILY (Patient taking differently: Inhale 1 puff into the lungs daily as needed (sob and wheezing). ) 60 each 5  . celecoxib (CELEBREX) 200 MG capsule Take 1 capsule (200 mg total) by mouth 2 (two) times daily. 60 capsule 2  . cetirizine (ZYRTEC) 10 MG tablet TAKE 1 TABLET ONCE DAILY 30 tablet 11  . cloNIDine (CATAPRES) 0.1 MG tablet Take 0.1 mg by mouth at bedtime.     . diclofenac sodium (VOLTAREN) 1 % GEL Apply to affected area twice daily as needed (Patient taking differently: Apply 2 g topically 2 (two) times daily as needed (PAIN). ) 100 g 5  . divalproex (DEPAKOTE) 500 MG DR tablet Take 3 tablets (1,500 mg total) by mouth at bedtime. 90 tablet 1  . DULoxetine (CYMBALTA) 60 MG capsule Take 1 capsule (60 mg total) by mouth daily. 90 capsule 2  . fluticasone (FLONASE) 50 MCG/ACT nasal spray Place 1 spray 2 (two) times daily as needed into both nostrils for allergies or rhinitis. 16 g 6  . levothyroxine (SYNTHROID, LEVOTHROID) 125 MCG tablet Take 1 tablet (125 mcg  total) by mouth daily before breakfast. 30 tablet 0  . nystatin cream (MYCOSTATIN) APPLY TO THE AFFECTED AREA THREE TIMES DAILY AS NEEDED .Marland KitchenFOR TOPICAL USE ONLY... (Patient taking differently: APPLY TO THE AFFECTED AREA THREE TIMES DAILY AS NEEDED FOR YEAST/IRRITATION..FOR TOPICAL USE ONLY...) 30 g 0  . omeprazole (PRILOSEC) 20  MG capsule TAKE (1) CAPSULE DAILY 30 capsule 2  . ondansetron (ZOFRAN ODT) 4 MG disintegrating tablet Take 1 tablet (4 mg total) by mouth every 8 (eight) hours as needed for nausea or vomiting. 20 tablet 0  . polyethylene glycol powder (GLYCOLAX/MIRALAX) powder Take 17 g by mouth 2 (two) times daily as needed. (Patient taking differently: Take 17 g by mouth 2 (two) times daily as needed for moderate constipation. ) 3350 g 1  . PROAIR HFA 108 (90 Base) MCG/ACT inhaler USE 2 PUFF EVERY 4 HOURS AS NEEDED FOR WHEEZING OR SHORTNESS OF BREATH 8.5 g 5  . promethazine (PHENERGAN) 25 MG tablet Take 1 tablet (25 mg total) by mouth every 8 (eight) hours as needed for nausea or vomiting. 20 tablet 0  . risperidone (RISPERDAL) 4 MG tablet Take 1 tablet (4 mg total) by mouth at bedtime. 30 tablet 0  . traMADol (ULTRAM) 50 MG tablet Take 1 tablet (50 mg total) by mouth every 8 (eight) hours as needed for moderate pain. 90 tablet 2   No current facility-administered medications for this visit.      Musculoskeletal: Strength & Muscle Tone: decreased Gait & Station: unsteady, Need walker Patient leans: Left and Front  Psychiatric Specialty Exam: ROS  Last menstrual period 02/18/1995.There is no height or weight on file to calculate BMI.  General Appearance: Casual and Wearing oxygen mask.  Morbid obese  Eye Contact:  Fair  Speech:  Slow  Volume:  Normal  Mood:  Anxious  Affect:  Congruent  Thought Process:  Goal Directed  Orientation:  Full (Time, Place, and Person)  Thought Content: Rumination   Suicidal Thoughts:  No  Homicidal Thoughts:  No  Memory:  Immediate;   Fair Recent;   Fair Remote;   Fair  Judgement:  Good  Insight:  Present  Psychomotor Activity:  Decreased  Concentration:  Concentration: Fair and Attention Span: Fair  Recall:  [employer] of Knowledge: Fair  Language: Good  Akathisia:  No  Handed:  Right  AIMS (if indicated): not done  Assets:  Desire for  Improvement Housing Resilience Social Support  ADL's:  Intact  Cognition: WNL  Sleep:  Fair   Screenings: AIMS     Admission (Discharged) from 01/17/2015 in [facility]  AIMS Total Score  0    AUDIT     Admission (Discharged) from 01/17/2015 in [facility] Admission (Discharged) from 10/24/2012 in [facility]  Alcohol Use Disorder Identification Test Final Score (AUDIT)  0  0    Mini-Mental     Clinical Support from 11/08/2016 in [facility] from 06/28/2015 in [facility]  Total Score (max 30 points )  30  29    PHQ2-9     Office Visit from 10/08/2017 in [facility] Visit from 07/20/2017 in [facility] Visit from 07/02/2017 in [facility] Office Visit from 05/04/2017 in [facility] Visit from 04/16/2017 in [facility]  PHQ-2 Total Score  0  0  0  0  1       Assessment and Plan: Bipolar disorder type I.  Anxiety disorder NOS.  Patient has been sick and hospitalized twice for UTI.  Now she is feeling better.  Recently her blood pressure medication were discontinued at that helping her fall and dizziness.  Continue Risperdal 4 mg at bedtime, Cogentin 1 mg at bedtime, Depakote 1500 mg at bedtime and Cymbalta 60 mg daily.  I review her blood work results including Depakote level.  She has low platelet and she is seeing specialist for that.  She has multiple bruises which could be due to low platelet and frequent fall in the past.  I discussed postural hypotension.  She is using walker.  We discussed polypharmacy and we have discontinue hydroxyzine.  Recommended to call us back if she has any question, concern or if you feel worsening of the symptoms.  I will see her again in 3 months.   Kathlee Nations,  MD 11/01/2017, 4:32 PM

## 2017-11-04 DIAGNOSIS — G4733 Obstructive sleep apnea (adult) (pediatric): Secondary | ICD-10-CM | POA: Diagnosis not present

## 2017-11-07 DIAGNOSIS — G4733 Obstructive sleep apnea (adult) (pediatric): Secondary | ICD-10-CM | POA: Diagnosis not present

## 2017-11-09 DIAGNOSIS — G4733 Obstructive sleep apnea (adult) (pediatric): Secondary | ICD-10-CM | POA: Diagnosis not present

## 2017-11-10 DIAGNOSIS — Z9181 History of falling: Secondary | ICD-10-CM | POA: Diagnosis not present

## 2017-11-10 DIAGNOSIS — R531 Weakness: Secondary | ICD-10-CM | POA: Diagnosis not present

## 2017-11-10 DIAGNOSIS — R5381 Other malaise: Secondary | ICD-10-CM | POA: Diagnosis not present

## 2017-11-12 ENCOUNTER — Telehealth: Payer: Self-pay | Admitting: Family Medicine

## 2017-11-12 NOTE — Telephone Encounter (Signed)
LMOVM ppw has been done, waiting on signature in order to fax it to [facility]

## 2017-11-16 NOTE — Telephone Encounter (Signed)
Aware order has been faxed to [facility]

## 2017-11-21 ENCOUNTER — Other Ambulatory Visit: Payer: Self-pay | Admitting: Family Medicine

## 2017-11-21 DIAGNOSIS — R2681 Unsteadiness on feet: Secondary | ICD-10-CM | POA: Diagnosis not present

## 2017-11-21 DIAGNOSIS — I951 Orthostatic hypotension: Secondary | ICD-10-CM | POA: Diagnosis not present

## 2017-11-21 DIAGNOSIS — G4733 Obstructive sleep apnea (adult) (pediatric): Secondary | ICD-10-CM | POA: Diagnosis not present

## 2017-11-21 DIAGNOSIS — W19XXXA Unspecified fall, initial encounter: Secondary | ICD-10-CM | POA: Diagnosis not present

## 2017-11-21 DIAGNOSIS — J449 Chronic obstructive pulmonary disease, unspecified: Secondary | ICD-10-CM | POA: Diagnosis not present

## 2017-12-04 DIAGNOSIS — G4733 Obstructive sleep apnea (adult) (pediatric): Secondary | ICD-10-CM | POA: Diagnosis not present

## 2017-12-05 ENCOUNTER — Other Ambulatory Visit: Payer: Self-pay | Admitting: Family Medicine

## 2017-12-07 DIAGNOSIS — G4733 Obstructive sleep apnea (adult) (pediatric): Secondary | ICD-10-CM | POA: Diagnosis not present

## 2017-12-09 DIAGNOSIS — G4733 Obstructive sleep apnea (adult) (pediatric): Secondary | ICD-10-CM | POA: Diagnosis not present

## 2017-12-12 ENCOUNTER — Other Ambulatory Visit: Payer: Self-pay

## 2017-12-12 NOTE — Patient Outreach (Signed)
[facility]) Care Management  12/12/2017  [name] 1956-04-05 [id number]   Medication Adherence call to Mrs. [name] left a message for patient to call back but, [facility] said they having pre-packing every two weeks and she pick up every two weeks.[name] is showing past due under [place].   [facility] Management Direct Dial [phone]  Fax [phone] [email or web address]

## 2017-12-21 DIAGNOSIS — I951 Orthostatic hypotension: Secondary | ICD-10-CM | POA: Diagnosis not present

## 2017-12-21 DIAGNOSIS — W19XXXA Unspecified fall, initial encounter: Secondary | ICD-10-CM | POA: Diagnosis not present

## 2017-12-24 ENCOUNTER — Other Ambulatory Visit: Payer: Self-pay | Admitting: Family Medicine

## 2017-12-24 ENCOUNTER — Ambulatory Visit: Payer: Medicare Other | Admitting: Family Medicine

## 2017-12-25 NOTE — Telephone Encounter (Signed)
Last lipid 07/02/15

## 2017-12-31 ENCOUNTER — Other Ambulatory Visit: Payer: Self-pay | Admitting: Family Medicine

## 2017-12-31 DIAGNOSIS — A084 Viral intestinal infection, unspecified: Secondary | ICD-10-CM

## 2018-01-01 NOTE — Telephone Encounter (Signed)
Last seen 6/19

## 2018-01-02 ENCOUNTER — Other Ambulatory Visit: Payer: Self-pay | Admitting: Family Medicine

## 2018-01-02 DIAGNOSIS — A084 Viral intestinal infection, unspecified: Secondary | ICD-10-CM

## 2018-01-04 DIAGNOSIS — G4733 Obstructive sleep apnea (adult) (pediatric): Secondary | ICD-10-CM | POA: Diagnosis not present

## 2018-01-07 DIAGNOSIS — G4733 Obstructive sleep apnea (adult) (pediatric): Secondary | ICD-10-CM | POA: Diagnosis not present

## 2018-01-09 DIAGNOSIS — G4733 Obstructive sleep apnea (adult) (pediatric): Secondary | ICD-10-CM | POA: Diagnosis not present

## 2018-01-21 DIAGNOSIS — W19XXXA Unspecified fall, initial encounter: Secondary | ICD-10-CM | POA: Diagnosis not present

## 2018-01-21 DIAGNOSIS — I951 Orthostatic hypotension: Secondary | ICD-10-CM | POA: Diagnosis not present

## 2018-01-23 ENCOUNTER — Emergency Department (HOSPITAL_COMMUNITY)
Admission: EM | Admit: 2018-01-23 | Discharge: 2018-01-24 | Disposition: A | Discharge status: Home / Self Care | Payer: Medicare Other | Attending: Emergency Medicine | Admitting: Emergency Medicine

## 2018-01-23 ENCOUNTER — Other Ambulatory Visit: Payer: Self-pay

## 2018-01-23 ENCOUNTER — Encounter (HOSPITAL_COMMUNITY): Payer: Self-pay

## 2018-01-23 ENCOUNTER — Emergency Department (HOSPITAL_COMMUNITY): Payer: Medicare Other

## 2018-01-23 DIAGNOSIS — S8991XA Unspecified injury of right lower leg, initial encounter: Secondary | ICD-10-CM | POA: Diagnosis not present

## 2018-01-23 DIAGNOSIS — S80919A Unspecified superficial injury of unspecified knee, initial encounter: Secondary | ICD-10-CM | POA: Diagnosis not present

## 2018-01-23 DIAGNOSIS — M79671 Pain in right foot: Secondary | ICD-10-CM | POA: Diagnosis not present

## 2018-01-23 DIAGNOSIS — M25561 Pain in right knee: Secondary | ICD-10-CM | POA: Diagnosis not present

## 2018-01-23 DIAGNOSIS — Y929 Unspecified place or not applicable: Secondary | ICD-10-CM | POA: Insufficient documentation

## 2018-01-23 DIAGNOSIS — I2510 Atherosclerotic heart disease of native coronary artery without angina pectoris: Secondary | ICD-10-CM | POA: Diagnosis not present

## 2018-01-23 DIAGNOSIS — I252 Old myocardial infarction: Secondary | ICD-10-CM | POA: Insufficient documentation

## 2018-01-23 DIAGNOSIS — M549 Dorsalgia, unspecified: Secondary | ICD-10-CM | POA: Diagnosis not present

## 2018-01-23 DIAGNOSIS — W010XXA Fall on same level from slipping, tripping and stumbling without subsequent striking against object, initial encounter: Secondary | ICD-10-CM | POA: Diagnosis not present

## 2018-01-23 DIAGNOSIS — Y998 Other external cause status: Secondary | ICD-10-CM | POA: Insufficient documentation

## 2018-01-23 DIAGNOSIS — Z79899 Other long term (current) drug therapy: Secondary | ICD-10-CM | POA: Insufficient documentation

## 2018-01-23 DIAGNOSIS — F315 Bipolar disorder, current episode depressed, severe, with psychotic features: Secondary | ICD-10-CM | POA: Insufficient documentation

## 2018-01-23 DIAGNOSIS — E039 Hypothyroidism, unspecified: Secondary | ICD-10-CM | POA: Diagnosis not present

## 2018-01-23 DIAGNOSIS — R52 Pain, unspecified: Secondary | ICD-10-CM | POA: Diagnosis not present

## 2018-01-23 DIAGNOSIS — I5032 Chronic diastolic (congestive) heart failure: Secondary | ICD-10-CM | POA: Insufficient documentation

## 2018-01-23 DIAGNOSIS — M25562 Pain in left knee: Secondary | ICD-10-CM | POA: Diagnosis not present

## 2018-01-23 DIAGNOSIS — E785 Hyperlipidemia, unspecified: Secondary | ICD-10-CM | POA: Diagnosis not present

## 2018-01-23 DIAGNOSIS — W19XXXA Unspecified fall, initial encounter: Secondary | ICD-10-CM | POA: Diagnosis not present

## 2018-01-23 DIAGNOSIS — Y939 Activity, unspecified: Secondary | ICD-10-CM | POA: Insufficient documentation

## 2018-01-23 DIAGNOSIS — J449 Chronic obstructive pulmonary disease, unspecified: Secondary | ICD-10-CM | POA: Insufficient documentation

## 2018-01-23 DIAGNOSIS — S99921A Unspecified injury of right foot, initial encounter: Secondary | ICD-10-CM | POA: Diagnosis not present

## 2018-01-23 DIAGNOSIS — R440 Auditory hallucinations: Secondary | ICD-10-CM

## 2018-01-23 DIAGNOSIS — I110 Hypertensive heart disease with heart failure: Secondary | ICD-10-CM | POA: Diagnosis not present

## 2018-01-23 LAB — CBC WITH DIFFERENTIAL/PLATELET
BASOS PCT: 0 %
Basophils Absolute: 0 10*3/uL (ref 0.0–0.1)
Eosinophils Absolute: 0.1 10*3/uL (ref 0.0–0.7)
Eosinophils Relative: 2 %
HEMATOCRIT: 37.2 % (ref 36.0–46.0)
HEMOGLOBIN: 12.5 g/dL (ref 12.0–15.0)
LYMPHS ABS: 1.8 10*3/uL (ref 0.7–4.0)
LYMPHS PCT: 25 %
MCH: 31.8 pg (ref 26.0–34.0)
MCHC: 33.6 g/dL (ref 30.0–36.0)
MCV: 94.7 fL (ref 78.0–100.0)
MONO ABS: 0.8 10*3/uL (ref 0.1–1.0)
MONOS PCT: 12 %
NEUTROS ABS: 4.3 10*3/uL (ref 1.7–7.7)
NEUTROS PCT: 61 %
Platelets: 86 10*3/uL — ABNORMAL LOW (ref 150–400)
RBC: 3.93 MIL/uL (ref 3.87–5.11)
RDW: 12.4 % (ref 11.5–15.5)
WBC: 7 10*3/uL (ref 4.0–10.5)

## 2018-01-23 LAB — URINALYSIS, ROUTINE W REFLEX MICROSCOPIC
Bilirubin Urine: NEGATIVE
GLUCOSE, UA: NEGATIVE mg/dL
Hgb urine dipstick: NEGATIVE
Ketones, ur: NEGATIVE mg/dL
Leukocytes, UA: NEGATIVE
Nitrite: NEGATIVE
PROTEIN: NEGATIVE mg/dL
SPECIFIC GRAVITY, URINE: 1.008 (ref 1.005–1.030)
pH: 8 (ref 5.0–8.0)

## 2018-01-23 LAB — RAPID URINE DRUG SCREEN, HOSP PERFORMED
Amphetamines: NOT DETECTED
Barbiturates: NOT DETECTED
Benzodiazepines: NOT DETECTED
COCAINE: NOT DETECTED
OPIATES: NOT DETECTED
TETRAHYDROCANNABINOL: NOT DETECTED

## 2018-01-23 LAB — COMPREHENSIVE METABOLIC PANEL
ALK PHOS: 50 U/L (ref 38–126)
ALT: 8 U/L (ref 0–44)
AST: 15 U/L (ref 15–41)
Albumin: 3.5 g/dL (ref 3.5–5.0)
Anion gap: 11 (ref 5–15)
BILIRUBIN TOTAL: 0.9 mg/dL (ref 0.3–1.2)
BUN: 10 mg/dL (ref 8–23)
CALCIUM: 9.4 mg/dL (ref 8.9–10.3)
CO2: 28 mmol/L (ref 22–32)
CREATININE: 0.81 mg/dL (ref 0.44–1.00)
Chloride: 106 mmol/L (ref 98–111)
Glucose, Bld: 96 mg/dL (ref 70–99)
Potassium: 3.7 mmol/L (ref 3.5–5.1)
SODIUM: 145 mmol/L (ref 135–145)
TOTAL PROTEIN: 6.4 g/dL — AB (ref 6.5–8.1)

## 2018-01-23 LAB — SALICYLATE LEVEL: Salicylate Lvl: <7 mg/dL (ref 2.8–30.0)

## 2018-01-23 LAB — ETHANOL

## 2018-01-23 LAB — ACETAMINOPHEN LEVEL

## 2018-01-23 MED ORDER — DIVALPROEX SODIUM 500 MG PO DR TAB
1500.0000 mg | DELAYED_RELEASE_TABLET | Freq: Every day | ORAL | Status: DC
  Administered 2018-01-23: 1500 mg via ORAL
  Filled 2018-01-23: qty 3

## 2018-01-23 MED ORDER — DULOXETINE HCL 30 MG PO CPEP
60.0000 mg | ORAL_CAPSULE | Freq: Every day | ORAL | Status: DC
  Administered 2018-01-23 – 2018-01-24 (×2): 60 mg via ORAL
  Filled 2018-01-23 (×2): qty 2

## 2018-01-23 MED ORDER — CLONIDINE HCL 0.1 MG PO TABS
0.1000 mg | ORAL_TABLET | Freq: Every day | ORAL | Status: DC
  Administered 2018-01-23: 0.1 mg via ORAL
  Filled 2018-01-23: qty 1

## 2018-01-23 MED ORDER — ATORVASTATIN CALCIUM 20 MG PO TABS
20.0000 mg | ORAL_TABLET | Freq: Every day | ORAL | Status: DC
  Administered 2018-01-23 – 2018-01-24 (×2): 20 mg via ORAL
  Filled 2018-01-23 (×2): qty 1

## 2018-01-23 MED ORDER — RISPERIDONE 2 MG PO TABS
4.0000 mg | ORAL_TABLET | Freq: Every day | ORAL | Status: DC
  Administered 2018-01-23: 4 mg via ORAL
  Filled 2018-01-23: qty 2

## 2018-01-23 MED ORDER — BENZTROPINE MESYLATE 1 MG PO TABS
1.0000 mg | ORAL_TABLET | Freq: Every day | ORAL | Status: DC
  Administered 2018-01-23: 1 mg via ORAL
  Filled 2018-01-23: qty 1

## 2018-01-23 MED ORDER — LEVOTHYROXINE SODIUM 125 MCG PO TABS
125.0000 ug | ORAL_TABLET | Freq: Every day | ORAL | Status: DC
  Administered 2018-01-24: 125 ug via ORAL
  Filled 2018-01-23: qty 1

## 2018-01-23 NOTE — ED Notes (Signed)
[facility] Provider at bedside. CLAUDIA

## 2018-01-23 NOTE — BH Assessment (Signed)
Tele Assessment Note   Patient Name: Allison Sharp MRN: [id number] Referring Physician: Carmon Sails, PA Location of Patient: [facility] Location of Provider: [place] [facility]  Allison Sharp is an 62 y.o. female.  -Clinician reviewed note by Carmon Sails, PA.  Patient has long history of psych illness with auditory and visual hallucinations, states that these have become more frequent and are occurring daily now.  Patient's daughter at bedside states that when this happens her medications need to be adjusted.  Patient states she has been more frustrated in the last few weeks because she has fallen multiple times.  She has been diagnosed with dementia and she has been thinking about wanting to "end it".  States that she has thought about how she would do it in the past, she would take a bottle of pills.  She denies HI.   Patient is still endorsing SI to this clinician.  She wants to kill herself by overdosing on her medications.  She has attempted this twice in the past.  Patient says that she is depressed and is scared about these hallucinations.  She said that she has not had them ina long time.  Patient says that they frighten her and she gets anxious and starts thinking about killing herself.    Patient denies any HI.  She is seeing people from her past.  She says that it is hard to distinguish them from reality.  She will hear footsteps at times.  She also hears garbled voices talking to her.  Patient is pleasant to talk to.  She is depressed also about her physical state and how she cannot do the things that she used to be able to do.  Patient lives with her niece and she likes and appreciates the help that she provides.  Her sister is a NP and she goes to her for advice.  Patient is seen by Dr. Adele Schilder for outpatient psychiatry.  Her next appt with him is in early November.  Patient has been to [facility] in January of '18. She mentioned being at other hospitals in the past  also.  -Clinician discussed patient care with Lindon Romp, FNP who recommends gero psych placement.  TTS to seek placement.  Sophia, PA will let Carmon Sails, PA know that TTS recommends inpatient gero psych placement.  Diagnosis: F31.5 Bipolar 1 d/o most recent episode depressed w/ psychotic features  Past Medical History:  Past Medical History:  Diagnosis Date  . Allergy   . Anxiety   . Arthritis   . Asthma   . Bipolar 1 disorder ([place])   . CAD (coronary artery disease)   . Cellulitis   . CHF (congestive heart failure) ([facility])    diastolic dysfunction  . Chronic back pain   . Chronic headaches   . Complication of anesthesia    States she typically gets sick s/p anesthesia  . Contusion of sacrum   . COPD (chronic obstructive pulmonary disease) ([place])   . Depression   . Dyspnea    PFT 03/05/09 FEV1 2.77(98%), FVC 3.25(86%), FEV1% 85, TLC 5.88(99%), DLCO 60% ,  Methacholine challenge 03/16/09 normal ,  CT chest 03/12/09 no pulmonary disease  . Fungal infection   . GERD (gastroesophageal reflux disease)   . History of colonoscopy 10/17/2002   by Dr Rehman-> distal non-specific proctitis, small ext hemorrhoids,   . HTN (hypertension)   . Hyperlipidemia   . Hyperthyroidism   . Hypothyroidism    States she only has hyperthyroidism  .  Migraine headache   . Morbid obesity with body mass index of 50.0-59.9 in adult [facility]) JAN 2011 370 LBS   2004 311 BMI 45.9  . Myocardial infarction ([place])    NOV 1997  . OSA on CPAP    she had been on 2L O2 at night but that was stopped  . PONV (postoperative nausea and vomiting)   . PTSD (post-traumatic stress disorder)   . Suicidal ideation   . Urine incontinence   . Vitamin D deficiency     Past Surgical History:  Procedure Laterality Date  . ABDOMINAL HYSTERECTOMY     sept 1996  . APPENDECTOMY    . BACK SURGERY  2008  . CARDIAC CATHETERIZATION     nov 1997  . CHOLECYSTECTOMY    . COLONOSCOPY  10/17/2002    Distal proctitis, small  external hemorrhoids, otherwise/  normal colonoscopy. Suspect rectal bleeding secondary to hemorrhoids  . ESOPHAGOGASTRODUODENOSCOPY  03/18/09   fundic gland polyps/mild gastritis  . HERNIA REPAIR  1978  . JOINT REPLACEMENT     bil knee replacement  . KNEE ARTHROSCOPY    . MULTIPLE EXTRACTIONS WITH ALVEOLOPLASTY N/A 08/16/2015   Procedure: EXTRACTION OF TEETH THREE, SIX, EIGHT, NINE, ELEVEN, FOURTEEN, FIFTEEN, TWENTY-EIGHT WITH ALVEOLOPLASTY;  Surgeon: Diona Browner, DDS;  Location: [place];  Service: Oral Surgery;  Laterality: N/A;  . TONSILLECTOMY    . TOTAL VAGINAL HYSTERECTOMY    . TUBAL LIGATION      Family History:  Family History  Problem Relation Age of Onset  . Hypertension Mother   . Bipolar disorder Mother   . Dementia Mother   . Depression Mother   . Heart attack Mother   . AAA (abdominal aortic aneurysm) Mother   . Coronary artery disease Father   . Alcohol abuse Father   . Hypertension Brother   . Coronary artery disease Brother   . Bipolar disorder Brother   . Depression Brother   . Depression Sister   . Paranoid behavior Sister   . Cancer Sister        breast  . Bipolar disorder Sister   . Depression Sister   . Hypertension Sister   . Cancer Son        thyroid  . Cancer Maternal Aunt        breast metastatized to brain  . Anesthesia problems Neg Hx   . Hypotension Neg Hx   . Malignant hyperthermia Neg Hx   . Pseudochol deficiency Neg Hx     Social History:  reports that she has never smoked. She has never used smokeless tobacco. She reports that she does not drink alcohol or use drugs.  Additional Social History:  Alcohol / Drug Use Pain Medications: See PTA medication list Prescriptions: See PTA medication list Over the Counter: See PTA medication list History of alcohol / drug use?: No history of alcohol / drug abuse  CIWA: CIWA-Ar BP: (!) 124/92 Pulse Rate: 81 COWS:    Allergies:  Allergies  Allergen Reactions  . Abilify [Aripiprazole]  Other (See Comments)    hallucinations  . Naproxen Nausea And Vomiting and Swelling  . Ativan [Lorazepam] Other (See Comments)    Delirium  . Haldol [Haloperidol] Other (See Comments)    Hallucinating   . Hydroxyzine Other (See Comments)    hallucinations    Home Medications:  (Not in a hospital admission)  OB/GYN Status:  Patient's last menstrual period was 02/18/1995.  General Assessment Data Location of Assessment: [facility]  Assessment: In system Is this a Tele or Face-to-Face Assessment?: Tele Assessment Is this an Initial Assessment or a Re-assessment for this encounter?: Initial Assessment Marital status: Widowed [place] name: [name] Is patient pregnant?: No Pregnancy Status: No Living Arrangements: Other relatives(Pt lives with niece) Can pt return to current living arrangement?: Yes Admission Status: Voluntary Is patient capable of signing voluntary admission?: Yes Referral Source: Self/Family/Friend(Niece called EMS.) Insurance type: [facility]     Crisis Care Plan Living Arrangements: Other relatives(Pt lives with niece) Name of Psychiatrist: Dr. Adele Schilder Name of Therapist: None  Education Status Is patient currently in school?: No Is the patient employed, unemployed or receiving disability?: Receiving disability income  Risk to self with the past 6 months Suicidal Ideation: Yes-Currently Present Has patient been a risk to self within the past 6 months prior to admission? : Yes Suicidal Intent: Yes-Currently Present Has patient had any suicidal intent within the past 6 months prior to admission? : No Is patient at risk for suicide?: Yes Suicidal Plan?: Yes-Currently Present Has patient had any suicidal plan within the past 6 months prior to admission? : No Specify Current Suicidal Plan: Overdose on medication Access to Means: Yes Specify Access to Suicidal Means: Medications What has been your use of drugs/alcohol within the last 12 months?: None Previous  Attempts/Gestures: Yes How many times?: 2 Other Self Harm Risks: None Triggers for Past Attempts: Other (Comment)(Physical pain) Intentional Self Injurious Behavior: None Family Suicide History: No Recent stressful life event(s): Recent negative physical changes(Hearing voices ) Persecutory voices/beliefs?: No Depression: Yes Depression Symptoms: Despondent, Feeling worthless/self pity, Isolating Substance abuse history and/or treatment for substance abuse?: No Suicide prevention information given to non-admitted patients: Not applicable  Risk to Others within the past 6 months Homicidal Ideation: No Does patient have any lifetime risk of violence toward others beyond the six months prior to admission? : No Thoughts of Harm to Others: No Current Homicidal Intent: No Current Homicidal Plan: No Access to Homicidal Means: No Identified Victim: No one History of harm to others?: No Assessment of Violence: None Noted Violent Behavior Description: None reported Does patient have access to weapons?: No Criminal Charges Pending?: No Does patient have a court date: No Is patient on probation?: No  Psychosis Hallucinations: Auditory, Visual(Footsteps, garbled voices; sees people not there) Delusions: None noted  Mental Status Report Appearance/Hygiene: Unremarkable, In hospital gown(Morbid obesity) Eye Contact: Good Motor Activity: Freedom of movement, Unsteady Speech: Logical/coherent, Soft Level of Consciousness: Alert, Crying Mood: Depressed, Helpless, Sad, Anxious Affect: Anxious, Depressed Anxiety Level: Moderate Thought Processes: Coherent, Relevant Judgement: Impaired Orientation: Person, Place, Situation Obsessive Compulsive Thoughts/Behaviors: Minimal  Cognitive Functioning Concentration: Decreased Memory: Recent Impaired, Remote Intact Is patient IDD: No Is patient DD?: No Insight: Good Impulse Control: Fair Appetite: Poor Have you had any weight changes? : No  Change Sleep: Decreased Total Hours of Sleep: (6 hours when on medication) Vegetative Symptoms: Staying in bed  ADLScreening [facility] Assessment Services) Patient's cognitive ability adequate to safely complete daily activities?: Yes Patient able to express need for assistance with ADLs?: Yes Independently performs ADLs?: No  Prior Inpatient Therapy Prior Inpatient Therapy: Yes Prior Therapy Dates: Jan '18 Prior Therapy Facilty/Provider(s): [facility] Reason for Treatment: depression  Prior Outpatient Therapy Prior Outpatient Therapy: Yes Prior Therapy Dates: Current Prior Therapy Facilty/Provider(s): Dr. Adele Schilder Reason for Treatment: med management Does patient have an ACCT team?: No Does patient have Intensive In-House Services?  : No Does patient have [facility] services? : No Does patient have P4CC  services?: No  ADL Screening (condition at time of admission) Patient's cognitive ability adequate to safely complete daily activities?: Yes Is the patient deaf or have difficulty hearing?: Yes(Left ear is weaker than the right.) Does the patient have difficulty seeing, even when wearing glasses/contacts?: Yes(Uses glasses all the time.) Does the patient have difficulty concentrating, remembering, or making decisions?: Yes Patient able to express need for assistance with ADLs?: Yes Does the patient have difficulty dressing or bathing?: Yes Independently performs ADLs?: No Communication: Independent Dressing (OT): Needs assistance Is this a change from baseline?: Pre-admission baseline Grooming: Needs assistance Is this a change from baseline?: Pre-admission baseline Feeding: Independent Bathing: Needs assistance Is this a change from baseline?: Pre-admission baseline Toileting: Needs assistance Is this a change from baseline?: Pre-admission baseline(Some incontinence problems.) In/Out Bed: Needs assistance Is this a change from baseline?: Pre-admission baseline(Hospital bed at  home.) Walks in Home: Needs assistance Is this a change from baseline?: (Walker or w/c at home.) Does the patient have difficulty walking or climbing stairs?: Yes Weakness of Legs: Both Weakness of Arms/Hands: Both(Cannot lift arms over head.)  Home Assistive Devices/Equipment Home Assistive Devices/Equipment: Environmental consultant (specify type), Hospital bed, Tub transfer bench, Wheelchair    Abuse/Neglect Assessment (Assessment to be complete while patient is alone) Abuse/Neglect Assessment Can Be Completed: Yes Physical Abuse: Denies Verbal Abuse: Yes, past (Comment) Sexual Abuse: Denies Exploitation of patient/patient's resources: Denies Self-Neglect: Denies     Regulatory affairs officer (For Healthcare) Does Patient Have a Medical Advance Directive?: No Would patient like information on creating a medical advance directive?: No - Patient declined          Disposition:  Disposition Initial Assessment Completed for this Encounter: Yes Patient referred to: (Pt needs a geropsych placement)  This service was provided via telemedicine using a 2-way, interactive audio and Radiographer, therapeutic.  Names of all persons participating in this telemedicine service and their role in this encounter. Name: Curlene Dolphin, M.S. LCAS QP Role: clinician  Name: Allison Sharp Role: patient  Name:  Role:   Name:  Role:     Raymondo Band 01/23/2018 9:13 PM

## 2018-01-23 NOTE — ED Notes (Signed)
PT STATES SHE ALWAYS HAS THOUGHTS OF HARMING HERSELF AND A PLAN. HER PLAN IS TO HARM HERSELF WITH A BOTTLE OF PILLS. PT STATES SHE IS REQUESTING NIECE TO BE HCPOA. INFORMED INFORMATION CAN BE GIVEN

## 2018-01-23 NOTE — ED Notes (Addendum)
Pt stated "I fell this morning because I got tripped up in some papers."  Questioned pt if she had taken her Cymbalta today.  Pt stated "No, I fell before I got to take any of my meds."

## 2018-01-23 NOTE — ED Notes (Signed)
Attempted to get blood but was unsuccessful 

## 2018-01-23 NOTE — ED Notes (Signed)
PT ON HOME OXYGEN 2LNC. PT ADMITS TO WEARING HER OXYGEN. PT FELT FINE RISING FROM CHAIR. PT ADMITS SHE DID NOT USE WALKER TO ANSWER PHONE. PT STATES SHE FELT DIZZY ON HER RETURN FROM ANSWERING THE PHONE. PT ADMITS TO PAPER BEING ON THE FLOOR. PT DENIES PASSING OUT. PT FELL TO HER KNEES. DENIES CHEST PAIN. PT CONFIRMS SHE IS ALWAYS SHORT OF BREATH. HER SHORTNESS OF BREATH WAS NOT DIFFERENT THAN HER BASELINE.

## 2018-01-23 NOTE — ED Notes (Signed)
Bed: WA20 Expected date:  Expected time:  Means of arrival:  Comments: [facility] knee pain

## 2018-01-23 NOTE — ED Provider Notes (Addendum)
[facility] DEPT Provider Note   CSN: [id number] Arrival date & time: 01/23/18  1350     History   Chief Complaint Chief Complaint  Patient presents with  . Fall  . Knee Injury  . Knee Pain    right side  . Hallucinations  . Suicidal    HPI Allison Sharp is a 62 y.o. female with history of hypertension, depression, bipolar disorder, asthma, COPD with chronic respiratory failure on 2 L nasal cannula, CAD, heart failure, OSA on CPAP, is here for evaluation of fall.  Onset immediately PTA.  Patient states she walked to pick up the phone and on her way back to the couch she slipped on a piece of paper that was on the floor, subsequently landing on both of her knees but mostly in the right. She endorses mild to moderate pain to bilateral knees (R>L), right foot and low back. She has h/o chronic back pain s/p surgery that sometimes flares up.  She did not land on her buttocks. She was unable to stand up on her own and called EMS.  She denies prodromal CP, SOB, light-headedness, dizziness, palpitations.  She endorses prodromal SOB that she states is typical for her when she walks similar distances.  She was wearing 2 L [place] oxygen and using walker.  Pain worsened with palpation.  Alleviated by rest. No interventions PTA. Denies HA, vision changes, N/V, CP, abdominal pain, groin numbness, loss of bladder or bowel control, weakness paresthesias or numbness to extremities. No anticoagulants.   HPI  Past Medical History:  Diagnosis Date  . Allergy   . Anxiety   . Arthritis   . Asthma   . Bipolar 1 disorder ([place])   . CAD (coronary artery disease)   . Cellulitis   . CHF (congestive heart failure) (HCC)    diastolic dysfunction  . Chronic back pain   . Chronic headaches   . Complication of anesthesia    States she typically gets sick s/p anesthesia  . Contusion of sacrum   . COPD (chronic obstructive pulmonary disease) ([place])   . Depression   . Dyspnea    PFT 03/05/09 FEV1 2.77(98%), FVC 3.25(86%), FEV1% 85, TLC 5.88(99%), DLCO 60% ,  Methacholine challenge 03/16/09 normal ,  CT chest 03/12/09 no pulmonary disease  . Fungal infection   . GERD (gastroesophageal reflux disease)   . History of colonoscopy 10/17/2002   by Dr Rehman-> distal non-specific proctitis, small ext hemorrhoids,   . HTN (hypertension)   . Hyperlipidemia   . Hyperthyroidism   . Hypothyroidism    States she only has hyperthyroidism  . Migraine headache   . Morbid obesity with body mass index of 50.0-59.9 in adult [facility]) JAN 2011 370 LBS   2004 311 BMI 45.9  . Myocardial infarction ([place])    NOV 1997  . OSA on CPAP    she had been on 2L O2 at night but that was stopped  . PONV (postoperative nausea and vomiting)   . PTSD (post-traumatic stress disorder)   . Suicidal ideation   . Urine incontinence   . Vitamin D deficiency     Patient Active Problem List   Diagnosis Date Noted  . Orthostatic hypotension 10/24/2017  . Fatty liver 10/24/2017  . Thrombocytopenia ([place]) 09/29/2017  . Visual hallucinations   . Auditory hallucinations   . Acute encephalopathy 09/26/2017  . Lower urinary tract infectious disease 09/26/2017  . Bipolar I disorder, most recent episode depressed, severe w  psychosis ([place]) 06/21/2016  . History of MI (myocardial infarction) 06/21/2016  . Spinal stenosis of lumbar region with neurogenic claudication 03/29/2016  . Severe obesity (BMI >= 40) ([place]) 07/05/2015  . Hyperlipidemia LDL goal <130 07/05/2015  . MDD (major depressive disorder), single episode, severe with psychotic features ([place]) 01/21/2015  . 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 Bilateral knee pain 01/07/2014  . Scar condition and fibrosis of skin 12/11/2013  . Essential hypertension, benign 07/01/2013  . Asthma with acute exacerbation 11/04/2012  . OSA on CPAP 11/04/2012  . Back pain, chronic 08/27/2012  . Vitamin D insufficiency 04/04/2012  . Insomnia due to mental disorder  04/04/2012  . Anxiety 04/04/2012  . OCD (obsessive compulsive disorder) 04/04/2012  . PTSD (post-traumatic stress disorder) 06/28/2011  . Coronary artery disease 08/26/2010  . FATTY LIVER DISEASE 03/29/2009  . GERD 03/01/2009  . Morbid obesity ([place]) 02/15/2009  . Hypothyroidism 02/12/2009  . HYPERCHOLESTEROLEMIA 02/12/2009    Past Surgical History:  Procedure Laterality Date  . ABDOMINAL HYSTERECTOMY     sept 1996  . APPENDECTOMY    . BACK SURGERY  2008  . CARDIAC CATHETERIZATION     nov 1997  . CHOLECYSTECTOMY    . COLONOSCOPY  10/17/2002    Distal proctitis, small external hemorrhoids, otherwise/  normal colonoscopy. Suspect rectal bleeding secondary to hemorrhoids  . ESOPHAGOGASTRODUODENOSCOPY  03/18/09   fundic gland polyps/mild gastritis  . HERNIA REPAIR  1978  . JOINT REPLACEMENT     bil knee replacement  . KNEE ARTHROSCOPY    . MULTIPLE EXTRACTIONS WITH ALVEOLOPLASTY N/A 08/16/2015   Procedure: EXTRACTION OF TEETH THREE, SIX, EIGHT, NINE, ELEVEN, FOURTEEN, FIFTEEN, TWENTY-EIGHT WITH ALVEOLOPLASTY;  Surgeon: Diona Browner, DDS;  Location: [facility];  Service: Oral Surgery;  Laterality: N/A;  . TONSILLECTOMY    . TOTAL VAGINAL HYSTERECTOMY    . TUBAL LIGATION       OB History   None      Home Medications    Prior to Admission medications   Medication Sig Start Date End Date Taking? Authorizing Provider  atorvastatin (LIPITOR) 20 MG tablet TAKE 1 TABLET DAILY 12/26/17  Yes Dettinger, Fransisca Kaufmann, MD  benztropine (COGENTIN) 1 MG tablet Take 1 tablet (1 mg total) by mouth at bedtime. 11/01/17 11/01/18 Yes Arfeen, Arlyce Harman, MD  celecoxib (CELEBREX) 200 MG capsule TAKE  (1)  CAPSULE  TWICE DAILY. Patient taking differently: Take 200 mg by mouth 2 (two) times daily.  11/21/17  Yes Dettinger, Fransisca Kaufmann, MD  cetirizine (ZYRTEC) 10 MG tablet TAKE 1 TABLET ONCE DAILY 08/22/16  Yes Dettinger, Fransisca Kaufmann, MD  cloNIDine (CATAPRES) 0.1 MG tablet TAKE ONE TABLET AT BEDTIME 11/21/17  Yes Dettinger,  Fransisca Kaufmann, MD  divalproex (DEPAKOTE) 500 MG DR tablet Take 3 tablets (1,500 mg total) by mouth at bedtime. 11/01/17  Yes Arfeen, Arlyce Harman, MD  DULoxetine (CYMBALTA) 60 MG capsule Take 1 capsule (60 mg total) by mouth daily. 11/01/17  Yes Arfeen, Arlyce Harman, MD  levothyroxine (SYNTHROID, LEVOTHROID) 125 MCG tablet TAKE (1) TABLET DAILY BE- FORE BREAKFAST. Patient taking differently: Take 125 mcg by mouth daily before breakfast.  11/21/17  Yes Dettinger, Fransisca Kaufmann, MD  omeprazole (PRILOSEC) 20 MG capsule TAKE (1) CAPSULE DAILY Patient taking differently: Take 20 mg by mouth daily.  12/06/17  Yes Dettinger, Fransisca Kaufmann, MD  risperidone (RISPERDAL) 4 MG tablet Take 1 tablet (4 mg total) by mouth at bedtime. 11/01/17 11/01/18 Yes Arfeen, Arlyce Harman, MD  traMADol (ULTRAM) 50 MG  tablet Take 1 tablet (50 mg total) by mouth every 8 (eight) hours as needed for moderate pain. 10/31/17  Yes Dettinger, Fransisca Kaufmann, MD  albuterol (PROVENTIL) (2.5 MG/3ML) 0.083% nebulizer solution Take 3 mLs (2.5 mg total) by nebulization every 4 (four) hours as needed for wheezing or shortness of breath. 06/02/16   Dettinger, Fransisca Kaufmann, MD  BREO ELLIPTA 100-25 MCG/INH AEPB INHALE 1 PUFF INTO THE LUNGS DAILY Patient taking differently: Inhale 1 puff into the lungs daily as needed (sob and wheezing).  07/24/16   Dettinger, Fransisca Kaufmann, MD  diclofenac sodium (VOLTAREN) 1 % GEL Apply to affected area twice daily as needed Patient taking differently: Apply 2 g topically 2 (two) times daily as needed (PAIN).  03/15/16   Dettinger, Fransisca Kaufmann, MD  fluticasone (FLONASE) 50 MCG/ACT nasal spray Place 1 spray 2 (two) times daily as needed into both nostrils for allergies or rhinitis. Patient not taking: Reported on 01/23/2018 04/16/17   Terald Sleeper, PA-C  nystatin cream (MYCOSTATIN) APPLY TO THE AFFECTED AREA THREE TIMES DAILY AS NEEDED .Marland KitchenFOR TOPICAL USE ONLY... Patient not taking: Reported on 01/23/2018 02/20/17   Dettinger, Fransisca Kaufmann, MD  ondansetron (ZOFRAN-ODT) 4 MG  disintegrating tablet DISSOLVE 1 TABLET IN MOUTH EVERY 8 HOURS AS NEEDED FOR NAUSEA AND VOMITING Patient taking differently: Take 4 mg by mouth every 8 (eight) hours as needed for nausea or vomiting.  01/02/18   Dettinger, Fransisca Kaufmann, MD  polyethylene glycol powder (GLYCOLAX/MIRALAX) powder Take 17 g by mouth 2 (two) times daily as needed. Patient taking differently: Take 17 g by mouth 2 (two) times daily as needed for moderate constipation.  06/07/17   Dettinger, Fransisca Kaufmann, MD  PROAIR HFA 108 [phone] Base) MCG/ACT inhaler USE 2 PUFF EVERY 4 HOURS AS NEEDED FOR WHEEZING OR SHORTNESS OF BREATH Patient taking differently: Inhale 2 puffs into the lungs every 6 (six) hours as needed for wheezing or shortness of breath.  07/24/16   Dettinger, Fransisca Kaufmann, MD  promethazine (PHENERGAN) 25 MG tablet Take 1 tablet (25 mg total) by mouth every 8 (eight) hours as needed for nausea or vomiting. Patient not taking: Reported on 01/23/2018 07/05/17   Dettinger, Fransisca Kaufmann, MD    Family History Family History  Problem Relation Age of Onset  . Hypertension Mother   . Bipolar disorder Mother   . Dementia Mother   . Depression Mother   . Heart attack Mother   . AAA (abdominal aortic aneurysm) Mother   . Coronary artery disease Father   . Alcohol abuse Father   . Hypertension Brother   . Coronary artery disease Brother   . Bipolar disorder Brother   . Depression Brother   . Depression Sister   . Paranoid behavior Sister   . Cancer Sister        breast  . Bipolar disorder Sister   . Depression Sister   . Hypertension Sister   . Cancer Son        thyroid  . Cancer Maternal Aunt        breast metastatized to brain  . Anesthesia problems Neg Hx   . Hypotension Neg Hx   . Malignant hyperthermia Neg Hx   . Pseudochol deficiency Neg Hx     Social History Social History   Tobacco Use  . Smoking status: Never Smoker  . Smokeless tobacco: Never Used  Substance Use Topics  . Alcohol use: No    Alcohol/week: 0.0  standard drinks  . Drug use:  No     Allergies   Abilify [aripiprazole]; Naproxen; Ativan [lorazepam]; Haldol [haloperidol]; and Hydroxyzine   Review of Systems Review of Systems  Musculoskeletal: Positive for arthralgias.  Skin: Positive for color change.     Physical Exam Updated Vital Signs BP (!) 124/92   Pulse 81   Temp 98 F (36.7 C) (Oral)   Resp 18   Ht 5\' 9"  (1.753 m)   Wt (!) 175.1 kg   LMP 02/18/1995   SpO2 100%   BMI 57.00 kg/m   Physical Exam  Constitutional: She is oriented to person, place, and time. She appears well-developed and well-nourished. She is cooperative. She is easily aroused.  HENT:  Head: Atraumatic.  No abrasions, lacerations, deformity, defect, tenderness or crepitus of facial, nasal, scalp bones. No Raccoon's eyes. No Battle's sign.  No epistaxis or rhinorrhea, septum midline.  No intraoral bleeding or injury. No malocclusion.   Eyes: Conjunctivae are normal.  Lids normal. EOMs and PERRL intact.   Neck:  C-spine: no midline or paraspinal muscular tenderness. Full active ROM of cervical spine w/o pain. Trachea midline  Cardiovascular: Normal rate, regular rhythm, S1 normal, S2 normal and normal heart sounds. Exam reveals no distant heart sounds.  Pulses:      Carotid pulses are 2+ on the right side, and 2+ on the left side.      Radial pulses are 2+ on the right side, and 2+ on the left side.       Dorsalis pedis pulses are 2+ on the right side, and 2+ on the left side.  2+ radial and DP pulses bilaterally  Pulmonary/Chest: Effort normal and breath sounds normal. She has no decreased breath sounds.  No anterior/posterior thorax tenderness. Equal and symmetric chest wall expansion   Abdominal: Soft. There is no tenderness.  Multiple scattered circular area of ecchymosis to lower abdomen consistent with insulin IM injections  Musculoskeletal: Normal range of motion. She exhibits tenderness. She exhibits no deformity.  T-spine: no  paraspinal muscular tenderness or midline tenderness.   L-spine: mild mid line tenderness along previous well healed surgical scar. No paraspinal muscle tenderness.  Pelvis: no instability with AP/L compression, leg shortening or rotation. Full PROM of hips bilaterally without pain. Right knee: small area of ecchymosis to inferior patella and tibial tuberosity, mildly tender.  Full PROM of knee with normal J tracking of patella, no crepitus, no pain. Left knee: small area of ecchymosis to medial/inferior knee with mild tenderness. Full PROM of knee without pain.   Neurological: She is alert, oriented to person, place, and time and easily aroused.  Speech is fluent without obvious dysarthria or dysphasia. Strength 5/5 with hand grip and ankle F/E.   Sensation to light touch intact in hands and feet. CN I, II and VIII not tested. CN II-XII grossly intact bilaterally.   Skin: Skin is warm and dry. Capillary refill takes less than 2 seconds.  Psychiatric: Her behavior is normal. Thought content normal.     ED Treatments / Results  Labs (all labs ordered are listed, but only abnormal results are displayed) Labs Reviewed  COMPREHENSIVE METABOLIC PANEL - Abnormal; Notable for the following components:      Result Value   Total Protein 6.4 (*)    All other components within normal limits  CBC WITH DIFFERENTIAL/PLATELET - Abnormal; Notable for the following components:   Platelets 86 (*)    All other components within normal limits  ACETAMINOPHEN LEVEL - Abnormal; Notable for the following  components:   Acetaminophen (Tylenol), Serum <10 (*)    All other components within normal limits  ETHANOL  RAPID URINE DRUG SCREEN, HOSP PERFORMED  SALICYLATE LEVEL  URINALYSIS, ROUTINE W REFLEX MICROSCOPIC    EKG None  Radiology Dg Lumbar Spine Complete  Result Date: 01/23/2018 CLINICAL DATA:  Patient slipped on paper today now with back pain. EXAM: LUMBAR SPINE - COMPLETE 4+ VIEW COMPARISON:  CT  abdomen and pelvis - 10/22/2017 FINDINGS: Examination is degraded due to patient body habitus. There are 5 non rib-bearing lumbar type vertebral bodies. Normal alignment of lumbar spine. No anterolisthesis or retrolisthesis. Post L4-S1 paraspinal fusion without evidence of hardware failure loosening. Lumbar vertebral body heights appear preserved Moderate to severe DDD of L3-L4 with disc space height loss, endplate irregularity and sclerosis. Limited visualization of the bilateral SI joints is normal. Large colonic stool burden without evidence of enteric obstruction. Post cholecystectomy. IMPRESSION: 1. No acute findings. 2. Post L4-S1 paraspinal fusion without evidence of hardware failure loosening. 3. Moderate to severe DDD of L3-L4. Electronically Signed   By: Sandi Mariscal M.D.   On: 01/23/2018 15:56   Dg Knee Complete 4 Views Left  Result Date: 01/23/2018 CLINICAL DATA:  Patient slipped on paper at home, now with left knee pain. EXAM: LEFT KNEE - COMPLETE 4+ VIEW COMPARISON:  None. FINDINGS: No fracture or dislocation. Post left total knee replacement without evidence of hardware failure or loosening. No joint effusion. Regional soft tissues appear normal. IMPRESSION: 1. No acute findings. 2. Post left total knee replacement without evidence of hardware failure or loosening. Electronically Signed   By: Sandi Mariscal M.D.   On: 01/23/2018 15:57   Dg Knee Complete 4 Views Right  Result Date: 01/23/2018 CLINICAL DATA:  Slipped on paper at home now with right knee pain. EXAM: RIGHT KNEE - COMPLETE 4+ VIEW COMPARISON:  None. FINDINGS: Post right total knee replacement without evidence of hardware failure or loosening. No definite fracture or dislocation. No dislocation. No joint effusion. Regional soft tissues appear normal. IMPRESSION: 1. No acute findings. 2. Post right total knee replacement without evidence of hardware failure loosening. Electronically Signed   By: Sandi Mariscal M.D.   On: 01/23/2018 15:52    Dg Foot Complete Right  Result Date: 01/23/2018 CLINICAL DATA:  Slipped on paper at home, now with right foot pain EXAM: RIGHT FOOT COMPLETE - 3+ VIEW COMPARISON:  08/30/2016 FINDINGS: No fracture or dislocation. No significant hallux valgus deformity. Pes cavus deformity suspected on this nonweightbearing radiograph. Joint spaces are preserved. No erosions. Moderate-sized plantar calcaneal spur. Minimal enthesopathic change involving the Achilles tendon insertion site. Regional soft tissues appear normal. IMPRESSION: 1. No acute findings. 2. Moderate-sized plantar calcaneal spur. Electronically Signed   By: Sandi Mariscal M.D.   On: 01/23/2018 15:58    Procedures Procedures (including critical care time)  Medications Ordered in ED Medications  atorvastatin (LIPITOR) tablet 20 mg (has no administration in time range)  benztropine (COGENTIN) tablet 1 mg (has no administration in time range)  cloNIDine (CATAPRES) tablet 0.1 mg (has no administration in time range)  divalproex (DEPAKOTE) DR tablet 1,500 mg (has no administration in time range)  DULoxetine (CYMBALTA) DR capsule 60 mg (has no administration in time range)  levothyroxine (SYNTHROID, LEVOTHROID) tablet 125 mcg (has no administration in time range)  risperiDONE (RISPERDAL) tablet 4 mg (has no administration in time range)     Initial Impression / Assessment and Plan / ED Course  I have reviewed the  triage vital signs and the nursing notes.  Pertinent labs & imaging results that were available during my care of the patient were reviewed by me and considered in my medical decision making (see chart for details).     1615: X-rays negative for acute injuries.  Updated patient.  Patient and daughter at bedside requesting psych consult.  Patient has long history of psych illness with auditory and visual hallucinations, states that these have become more frequent and are occurring daily now.  Patient's daughter at bedside states that  when this happens her medications need to be adjusted.  Patient states she has been more frustrated in the last few weeks because she has fallen multiple times.  She has been diagnosed with dementia and she has been thinking about wanting to "end it".  States that she has thought about how she would do it in the past, she would take a bottle of pills.  She denies HI.  We will add medical clearance lab work.  Daughter states last time this happened she had a UTI and they told her that is why her hallucinations were worse, will add a UA as well.  I do not think patient needs chest x-ray, she is not requiring increased oxygen demands, afebrile, without tachycardia tachypnea or hypoxia.  1945: Lab work reassuring, pt med cleared. She ambulated in ER at baseline.  Pending TTS consult. Home meds reordered.   2150: TTS recommends inpatient psych tx and they are seeking geri psych placement.   Final Clinical Impressions(s) / ED Diagnoses   Final diagnoses:  Fall, initial encounter  Acute pain of right knee  Auditory hallucination    ED Discharge Orders    None         Arlean Hopping 01/23/18 2150    Davonna Belling, MD 01/24/18 [phone]

## 2018-01-23 NOTE — ED Notes (Signed)
Patient transported to X-ray 

## 2018-01-23 NOTE — ED Triage Notes (Signed)
Per [facility]- pt resides at home. Pt reports a mechanical fall. She slipped on paper on floor landing on both knees. Pt c/o RT KNEE pain only. Pt denies NO LOC, denies neck and back pain. NO trauma noted. Pt sound sitting up in chair by first responding team. Pt uses assisted device (walker) with ambulation. Pt reports no other complaints

## 2018-01-23 NOTE — ED Notes (Signed)
FAMILY AT BEDSIDE. DAUGHTER STATES PT HAS INCREASED HALLUCINATIONS. PT DENIED EARLIER IN ASSESSMENT. EDPA CLAUDIA EVALUATED AND PT RESPONDED "HALLUCINATIONS ARE SAYING I HAVE A DOG AND I DO NOT HAVE A DOG." FAMILY STATES SHE NEEDS MEDICATION AJUSTMENT.

## 2018-01-23 NOTE — ED Notes (Signed)
Pt stated "I'm suicidal.  I would OD.  I've been thinking about it a lot lately.  I see Dr. [name] and saw him about 1 month ago.  He knows how I feel because I keep him informed.  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

## 2018-01-24 DIAGNOSIS — Z560 Unemployment, unspecified: Secondary | ICD-10-CM

## 2018-01-24 DIAGNOSIS — Z810 Family history of intellectual disabilities: Secondary | ICD-10-CM

## 2018-01-24 DIAGNOSIS — Z818 Family history of other mental and behavioral disorders: Secondary | ICD-10-CM

## 2018-01-24 DIAGNOSIS — R440 Auditory hallucinations: Secondary | ICD-10-CM

## 2018-01-24 DIAGNOSIS — Z811 Family history of alcohol abuse and dependence: Secondary | ICD-10-CM

## 2018-01-24 NOTE — Discharge Instructions (Addendum)
For your behavioral health needs, you are advised to continue treatment at the [facility] at [facility].  You have an appointment with Lulu Riding, MD scheduled for tomorrow, Friday, January 25, 2018 at 9:00 am:       [facility]. [address] [place], [place] [address]      [phone]

## 2018-01-24 NOTE — BH Assessment (Signed)
[facility] Assessment Progress Note  Per Ambrose Finland, MD, this pt does not require psychiatric hospitalization at this time.  Pt is to be discharged from [facility] with recommendation to continue treatment at the [facility] [place].  Pt has an appointment scheduled for Friday, 01/24/2018 at 09:00 with Lulu Riding, MD.  This has been included in pt's discharge instructions.  Pt's nurse, Tilda Franco, has been notified.  Jalene Mullet, [facility] Triage Specialist [phone]

## 2018-01-24 NOTE — Consult Note (Addendum)
[facility] Psych ED Discharge  01/24/2018 12:08 PM Allison Sharp  MRN:  [id number] Principal Problem: Auditory hallucinations Discharge Diagnoses:  Patient Active Problem List   Diagnosis Date Noted  . Orthostatic hypotension [I95.1] 10/24/2017  . Fatty liver [K76.0] 10/24/2017  . Thrombocytopenia ([place]) [D69.6] 09/29/2017  . Visual hallucinations [R44.1]   . Auditory hallucinations [R44.0]   . Acute encephalopathy [G93.40] 09/26/2017  . Lower urinary tract infectious disease [N39.0] 09/26/2017  . Bipolar I disorder, most recent episode depressed, severe w psychosis ([place]) [F31.5] 06/21/2016  . History of MI (myocardial infarction) [I25.2] 06/21/2016  . Spinal stenosis of lumbar region with neurogenic claudication [M48.062] 03/29/2016  . Severe obesity (BMI >= 40) ([place]) [E66.01] 07/05/2015  . Hyperlipidemia LDL goal <130 [E78.5] 07/05/2015  . MDD (major depressive disorder), single episode, severe with psychotic features ([place]) [F32.3] 01/21/2015  . 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 Bilateral knee pain [M25.561, M25.562] 01/07/2014  . Scar condition and fibrosis of skin [L90.5] 12/11/2013  . Essential hypertension, benign [I10] 07/01/2013  . Asthma with acute exacerbation [J45.901] 11/04/2012  . OSA on CPAP [G47.33, Z99.89] 11/04/2012  . Back pain, chronic [M54.9, G89.29] 08/27/2012  . Vitamin D insufficiency [E55.9] 04/04/2012  . Insomnia due to mental disorder [F51.05] 04/04/2012  . Anxiety [F41.9] 04/04/2012  . OCD (obsessive compulsive disorder) [F42.9] 04/04/2012  . PTSD (post-traumatic stress disorder) [F43.10] 06/28/2011  . Coronary artery disease [I25.10] 08/26/2010  . FATTY LIVER DISEASE [K76.89] 03/29/2009  . GERD [K21.9] 03/01/2009  . Morbid obesity ([place]) [E66.01] 02/15/2009  . Hypothyroidism [E03.9] 02/12/2009  . HYPERCHOLESTEROLEMIA [E78.00] 02/12/2009    Subjective: Pt was seen and chart reviewed with treatment team and Dr Louretta Shorten.  Pt denies  suicidal/homicidal ideation, denies auditory/visual hallucinations and does not appear to be responding to internal stimuli. Pt has a history of hearing voices but could not say what the voices tell her. Pt has an outpatient psychiatrist with [facility] and has an appointment on 12-29-17 for medication management. Pt lives with her niece and her niece's son. Pt's pharmacy prepares her medications in daily envelopes so she knows exactly what she needs to take each day. Pt is stable and psychiatrically clear for discharge.   Total Time spent with patient: 45 minutes  Past Psychiatric History: As above  Past Medical History:  Past Medical History:  Diagnosis Date  . Allergy   . Anxiety   . Arthritis   . Asthma   . Bipolar 1 disorder ([place])   . CAD (coronary artery disease)   . Cellulitis   . CHF (congestive heart failure) (H[facility])    diastolic dysfunction  . Chronic back pain   . Chronic headaches   . Complication of anesthesia    States she typically gets sick s/p anesthesia  . Contusion of sacrum   . COPD (chronic obstructive pulmonary disease) ([place])   . Depression   . Dyspnea    PFT 03/05/09 FEV1 2.77(98%), FVC 3.25(86%), FEV1% 85, TLC 5.88(99%), DLCO 60% ,  Methacholine challenge 03/16/09 normal ,  CT chest 03/12/09 no pulmonary disease  . Fungal infection   . GERD (gastroesophageal reflux disease)   . History of colonoscopy 10/17/2002   by Dr Rehman-> distal non-specific proctitis, small ext hemorrhoids,   . HTN (hypertension)   . Hyperlipidemia   . Hyperthyroidism   . Hypothyroidism    States she only has hyperthyroidism  . Migraine headache   . Morbid obesity with body mass index of 50.0-59.9 in adult [facility])  JAN 2011 370 LBS   2004 311 BMI 45.9  . Myocardial infarction ([place])    NOV 1997  . OSA on CPAP    she had been on 2L O2 at night but that was stopped  . PONV (postoperative nausea and vomiting)   . PTSD (post-traumatic stress disorder)   . Suicidal  ideation   . Urine incontinence   . Vitamin D deficiency    Past Surgical History:  Procedure Laterality Date  . ABDOMINAL HYSTERECTOMY     sept 1996  . APPENDECTOMY    . BACK SURGERY  2008  . CARDIAC CATHETERIZATION     nov 1997  . CHOLECYSTECTOMY    . COLONOSCOPY  10/17/2002    Distal proctitis, small external hemorrhoids, otherwise/  normal colonoscopy. Suspect rectal bleeding secondary to hemorrhoids  . ESOPHAGOGASTRODUODENOSCOPY  03/18/09   fundic gland polyps/mild gastritis  . HERNIA REPAIR  1978  . JOINT REPLACEMENT     bil knee replacement  . KNEE ARTHROSCOPY    . MULTIPLE EXTRACTIONS WITH ALVEOLOPLASTY N/A 08/16/2015   Procedure: EXTRACTION OF TEETH THREE, SIX, EIGHT, NINE, ELEVEN, FOURTEEN, FIFTEEN, TWENTY-EIGHT WITH ALVEOLOPLASTY;  Surgeon: Diona Browner, DDS;  Location: [place];  Service: Oral Surgery;  Laterality: N/A;  . TONSILLECTOMY    . TOTAL VAGINAL HYSTERECTOMY    . TUBAL LIGATION     Family History:  Family History  Problem Relation Age of Onset  . Hypertension Mother   . Bipolar disorder Mother   . Dementia Mother   . Depression Mother   . Heart attack Mother   . AAA (abdominal aortic aneurysm) Mother   . Coronary artery disease Father   . Alcohol abuse Father   . Hypertension Brother   . Coronary artery disease Brother   . Bipolar disorder Brother   . Depression Brother   . Depression Sister   . Paranoid behavior Sister   . Cancer Sister        breast  . Bipolar disorder Sister   . Depression Sister   . Hypertension Sister   . Cancer Son        thyroid  . Cancer Maternal Aunt        breast metastatized to brain  . Anesthesia problems Neg Hx   . Hypotension Neg Hx   . Malignant hyperthermia Neg Hx   . Pseudochol deficiency Neg Hx    Family Psychiatric  History:Unknown Social History:  Social History   Substance and Sexual Activity  Alcohol Use No  . Alcohol/week: 0.0 standard drinks    Social History   Substance and Sexual Activity   Drug Use No   Social History   Socioeconomic History  . Marital status: Single    Spouse name: Not on file  . Number of children: 2  . Years of education: Not on file  . Highest education level: Not on file  Occupational History  . Occupation: Disabled    Fish farm manager: UNEMPLOYED    Comment: back problems  Social Needs  . Financial resource strain: Not on file  . Food insecurity:    Worry: Not on file    Inability: Not on file  . Transportation needs:    Medical: Not on file    Non-medical: Not on file  Tobacco Use  . Smoking status: Never Smoker  . Smokeless tobacco: Never Used  Substance and Sexual Activity  . Alcohol use: No    Alcohol/week: 0.0 standard drinks  . Drug use: No  .  Sexual activity: Never    Birth control/protection: Abstinence  Lifestyle  . Physical activity:    Days per week: Not on file    Minutes per session: Not on file  . Stress: Not on file  Relationships  . Social connections:    Talks on phone: Not on file    Gets together: Not on file    Attends religious service: Not on file    Active member of club or organization: Not on file    Attends meetings of clubs or organizations: Not on file    Relationship status: Not on file  Other Topics Concern  . Not on file  Social History Narrative   Ms. Allison Sharp is disabled and lives with her sister, Lavren. She has another sister with whom she has lived with in the past, and who is a Designer, jewellery, but not currently practicing.   She has two grown sons that she does not see often, as they live in other states. She has 5 grand children.   Ms. Allison Sharp has a long history of mental illness including depression, PTSD, suicidal and homicidal ideation.   She has been obese most all of her life. Her weight has significantly impacted her QOL. She recently lost 20 lbs. By decreasing portion size & increasing proteins.     Has this patient used any form of tobacco in the last 30 days? (Cigarettes, Smokeless Tobacco,  Cigars, and/or Pipes) Prescription not provided because: pt declined  Current Medications: Current Facility-Administered Medications  Medication Dose Route Frequency Provider Last Rate Last Dose  . atorvastatin (LIPITOR) tablet 20 mg  20 mg Oral Daily Kinnie Feil, PA-C   20 mg at 01/24/18 0901  . benztropine (COGENTIN) tablet 1 mg  1 mg Oral QHS Kinnie Feil, PA-C   1 mg at 01/23/18 2247  . cloNIDine (CATAPRES) tablet 0.1 mg  0.1 mg Oral QHS Kinnie Feil, PA-C   0.1 mg at 01/23/18 2247  . divalproex (DEPAKOTE) DR tablet 1,500 mg  1,500 mg Oral QHS Kinnie Feil, PA-C   1,500 mg at 01/23/18 2246  . DULoxetine (CYMBALTA) DR capsule 60 mg  60 mg Oral Daily Kinnie Feil, PA-C   60 mg at 01/24/18 0901  . levothyroxine (SYNTHROID, LEVOTHROID) tablet 125 mcg  125 mcg Oral QAC breakfast Kinnie Feil, PA-C   125 mcg at 01/24/18 0900  . risperiDONE (RISPERDAL) tablet 4 mg  4 mg Oral QHS Kinnie Feil, PA-C   4 mg at 01/23/18 2246   Current Outpatient Medications  Medication Sig Dispense Refill  . atorvastatin (LIPITOR) 20 MG tablet TAKE 1 TABLET DAILY 90 tablet 0  . benztropine (COGENTIN) 1 MG tablet Take 1 tablet (1 mg total) by mouth at bedtime. 90 tablet 0  . celecoxib (CELEBREX) 200 MG capsule TAKE  (1)  CAPSULE  TWICE DAILY. (Patient taking differently: Take 200 mg by mouth 2 (two) times daily. ) 60 capsule 2  . cetirizine (ZYRTEC) 10 MG tablet TAKE 1 TABLET ONCE DAILY 30 tablet 11  . cloNIDine (CATAPRES) 0.1 MG tablet TAKE ONE TABLET AT BEDTIME 30 tablet 5  . divalproex (DEPAKOTE) 500 MG DR tablet Take 3 tablets (1,500 mg total) by mouth at bedtime. 270 tablet 0  . DULoxetine (CYMBALTA) 60 MG capsule Take 1 capsule (60 mg total) by mouth daily. 90 capsule 0  . levothyroxine (SYNTHROID, LEVOTHROID) 125 MCG tablet TAKE (1) TABLET DAILY BE- FORE BREAKFAST. (Patient taking differently: Take 125 mcg  by mouth daily before breakfast. ) 30 tablet 11  . omeprazole  (PRILOSEC) 20 MG capsule TAKE (1) CAPSULE DAILY (Patient taking differently: Take 20 mg by mouth daily. ) 30 capsule 4  . risperidone (RISPERDAL) 4 MG tablet Take 1 tablet (4 mg total) by mouth at bedtime. 90 tablet 0  . traMADol (ULTRAM) 50 MG tablet Take 1 tablet (50 mg total) by mouth every 8 (eight) hours as needed for moderate pain. 90 tablet 2  . albuterol (PROVENTIL) (2.5 MG/3ML) 0.083% nebulizer solution Take 3 mLs (2.5 mg total) by nebulization every 4 (four) hours as needed for wheezing or shortness of breath. 75 mL 0  . BREO ELLIPTA 100-25 MCG/INH AEPB INHALE 1 PUFF INTO THE LUNGS DAILY (Patient taking differently: Inhale 1 puff into the lungs daily as needed (sob and wheezing). ) 60 each 5  . diclofenac sodium (VOLTAREN) 1 % GEL Apply to affected area twice daily as needed (Patient taking differently: Apply 2 g topically 2 (two) times daily as needed (PAIN). ) 100 g 5  . fluticasone (FLONASE) 50 MCG/ACT nasal spray Place 1 spray 2 (two) times daily as needed into both nostrils for allergies or rhinitis. (Patient not taking: Reported on 01/23/2018) 16 g 6  . nystatin cream (MYCOSTATIN) APPLY TO THE AFFECTED AREA THREE TIMES DAILY AS NEEDED .Marland KitchenFOR TOPICAL USE ONLY... (Patient not taking: Reported on 01/23/2018) 30 g 0  . ondansetron (ZOFRAN-ODT) 4 MG disintegrating tablet DISSOLVE 1 TABLET IN MOUTH EVERY 8 HOURS AS NEEDED FOR NAUSEA AND VOMITING (Patient taking differently: Take 4 mg by mouth every 8 (eight) hours as needed for nausea or vomiting. ) 20 tablet 0  . polyethylene glycol powder (GLYCOLAX/MIRALAX) powder Take 17 g by mouth 2 (two) times daily as needed. (Patient taking differently: Take 17 g by mouth 2 (two) times daily as needed for moderate constipation. ) 3350 g 1  . PROAIR HFA 108 (90 Base) MCG/ACT inhaler USE 2 PUFF EVERY 4 HOURS AS NEEDED FOR WHEEZING OR SHORTNESS OF BREATH (Patient taking differently: Inhale 2 puffs into the lungs every 6 (six) hours as needed for wheezing or  shortness of breath. ) 8.5 g 5  . promethazine (PHENERGAN) 25 MG tablet Take 1 tablet (25 mg total) by mouth every 8 (eight) hours as needed for nausea or vomiting. (Patient not taking: Reported on 01/23/2018) 20 tablet 0   PTA Medications:  (Not in a hospital admission)  Musculoskeletal: Strength & Muscle Tone: within normal limits Gait & Station: normal Patient leans: N/A  Psychiatric Specialty Exam: Physical Exam  Constitutional: She appears well-developed and well-nourished.  HENT:  Head: Normocephalic.  Respiratory: Effort normal.  Musculoskeletal: Normal range of motion.  Neurological: She is alert.  Psychiatric: Her speech is normal and behavior is normal. Thought content normal. Her mood appears anxious. Cognition and memory are normal. She expresses impulsivity. She exhibits a depressed mood.    ROS  Blood pressure 129/68, pulse 81, temperature 97.8 F (36.6 C), temperature source Oral, resp. rate 18, height 5\' 9"  (1.753 m), weight (!) 175.1 kg, last menstrual period 02/18/1995, SpO2 100 %.Body mass index is 57 kg/m.  General Appearance: Casual  Eye Contact:  Good  Speech:  Clear and Coherent and Slow  Volume:  Normal  Mood:  Depressed  Affect:  Congruent and Depressed  Thought Process:  Coherent and Descriptions of Associations: Intact  Orientation:  Full (Time, Place, and Person)  Thought Content:  Hallucinations: Auditory  Suicidal Thoughts:  No  Homicidal  Thoughts:  No  Memory:  Immediate;   Good Recent;   Good Remote;   Fair  Judgement:  Fair  Insight:  Lacking  Psychomotor Activity:  Decreased  Concentration:  Concentration: Good and Attention Span: Good  Recall:  [place] of Knowledge:  Good  Language:  Good  Akathisia:  No  Handed:  Right  AIMS (if indicated):     Assets:  Agricultural consultant Housing Social Support  ADL's:  Intact  Cognition:  WNL  Sleep:        Demographic Factors:  Caucasian and  Unemployed  Loss Factors: Financial problems/change in socioeconomic status  Historical Factors: Impulsivity  Risk Reduction Factors:   Sense of responsibility to family and Living with another person, especially a relative  Continued Clinical Symptoms:  Depression:   Impulsivity  Cognitive Features That Contribute To Risk:  Closed-mindedness    Suicide Risk:  Minimal: No identifiable suicidal ideation.  Patients presenting with no risk factors but with morbid ruminations; may be classified as minimal risk based on the severity of the depressive symptoms    Plan Of Care/Follow-up recommendations:  Activity:  as tolerated Diet:  Heart Healthy  Disposition: Take all medications as prescribed. Keep all follow-up appointments as scheduled.  Do not consume alcohol or use illegal drugs while on prescription medications. Report any adverse effects from your medications to your primary care provider promptly.  In the event of recurrent symptoms or worsening symptoms, call 911, a crisis hotline, or go to the nearest emergency department for evaluation.   Ethelene Hal, NP 01/24/2018, 12:08 PM   Patient seen face to face for this evaluation, case discussed with treatment team and physician extender and formulated treatment plan. Reviewed the information documented and agree with the treatment plan.  Ambrose Finland, MD 01/24/2018

## 2018-01-25 ENCOUNTER — Ambulatory Visit (HOSPITAL_COMMUNITY)
Admission: RE | Admit: 2018-01-25 | Discharge: 2018-01-25 | Disposition: A | Discharge status: Home / Self Care | Payer: Medicare Other | Source: Home / Self Care | Attending: Psychiatry | Admitting: Psychiatry

## 2018-01-25 ENCOUNTER — Encounter (HOSPITAL_COMMUNITY): Payer: Self-pay | Admitting: Psychiatry

## 2018-01-25 ENCOUNTER — Ambulatory Visit (INDEPENDENT_AMBULATORY_CARE_PROVIDER_SITE_OTHER): Payer: Medicare Other | Admitting: Psychiatry

## 2018-01-25 ENCOUNTER — Other Ambulatory Visit: Payer: Self-pay

## 2018-01-25 ENCOUNTER — Emergency Department (HOSPITAL_COMMUNITY)
Admission: EM | Admit: 2018-01-25 | Discharge: 2018-01-27 | Disposition: A | Discharge status: Other Acute Inpatient Hospital | Payer: Medicare Other | Attending: Emergency Medicine | Admitting: Emergency Medicine

## 2018-01-25 DIAGNOSIS — F419 Anxiety disorder, unspecified: Secondary | ICD-10-CM | POA: Insufficient documentation

## 2018-01-25 DIAGNOSIS — Z818 Family history of other mental and behavioral disorders: Secondary | ICD-10-CM

## 2018-01-25 DIAGNOSIS — I2510 Atherosclerotic heart disease of native coronary artery without angina pectoris: Secondary | ICD-10-CM

## 2018-01-25 DIAGNOSIS — Z811 Family history of alcohol abuse and dependence: Secondary | ICD-10-CM

## 2018-01-25 DIAGNOSIS — Z888 Allergy status to other drugs, medicaments and biological substances status: Secondary | ICD-10-CM

## 2018-01-25 DIAGNOSIS — Z8249 Family history of ischemic heart disease and other diseases of the circulatory system: Secondary | ICD-10-CM

## 2018-01-25 DIAGNOSIS — I110 Hypertensive heart disease with heart failure: Secondary | ICD-10-CM

## 2018-01-25 DIAGNOSIS — I252 Old myocardial infarction: Secondary | ICD-10-CM

## 2018-01-25 DIAGNOSIS — Z803 Family history of malignant neoplasm of breast: Secondary | ICD-10-CM | POA: Insufficient documentation

## 2018-01-25 DIAGNOSIS — J45909 Unspecified asthma, uncomplicated: Secondary | ICD-10-CM | POA: Insufficient documentation

## 2018-01-25 DIAGNOSIS — I509 Heart failure, unspecified: Secondary | ICD-10-CM | POA: Insufficient documentation

## 2018-01-25 DIAGNOSIS — E039 Hypothyroidism, unspecified: Secondary | ICD-10-CM

## 2018-01-25 DIAGNOSIS — K219 Gastro-esophageal reflux disease without esophagitis: Secondary | ICD-10-CM | POA: Insufficient documentation

## 2018-01-25 DIAGNOSIS — E785 Hyperlipidemia, unspecified: Secondary | ICD-10-CM | POA: Insufficient documentation

## 2018-01-25 DIAGNOSIS — M1990 Unspecified osteoarthritis, unspecified site: Secondary | ICD-10-CM | POA: Insufficient documentation

## 2018-01-25 DIAGNOSIS — E559 Vitamin D deficiency, unspecified: Secondary | ICD-10-CM

## 2018-01-25 DIAGNOSIS — F319 Bipolar disorder, unspecified: Secondary | ICD-10-CM | POA: Diagnosis present

## 2018-01-25 DIAGNOSIS — F332 Major depressive disorder, recurrent severe without psychotic features: Secondary | ICD-10-CM | POA: Insufficient documentation

## 2018-01-25 DIAGNOSIS — R45851 Suicidal ideations: Secondary | ICD-10-CM | POA: Insufficient documentation

## 2018-01-25 DIAGNOSIS — Z79899 Other long term (current) drug therapy: Secondary | ICD-10-CM | POA: Insufficient documentation

## 2018-01-25 DIAGNOSIS — Z808 Family history of malignant neoplasm of other organs or systems: Secondary | ICD-10-CM

## 2018-01-25 DIAGNOSIS — G4733 Obstructive sleep apnea (adult) (pediatric): Secondary | ICD-10-CM

## 2018-01-25 DIAGNOSIS — F3160 Bipolar disorder, current episode mixed, unspecified: Secondary | ICD-10-CM

## 2018-01-25 DIAGNOSIS — Z90710 Acquired absence of both cervix and uterus: Secondary | ICD-10-CM | POA: Insufficient documentation

## 2018-01-25 DIAGNOSIS — J449 Chronic obstructive pulmonary disease, unspecified: Secondary | ICD-10-CM

## 2018-01-25 DIAGNOSIS — F315 Bipolar disorder, current episode depressed, severe, with psychotic features: Secondary | ICD-10-CM | POA: Diagnosis present

## 2018-01-25 DIAGNOSIS — Z96653 Presence of artificial knee joint, bilateral: Secondary | ICD-10-CM

## 2018-01-25 DIAGNOSIS — F4310 Post-traumatic stress disorder, unspecified: Secondary | ICD-10-CM

## 2018-01-25 DIAGNOSIS — Z9049 Acquired absence of other specified parts of digestive tract: Secondary | ICD-10-CM

## 2018-01-25 LAB — CBC WITH DIFFERENTIAL/PLATELET
Basophils Absolute: 0 10*3/uL (ref 0.0–0.1)
Basophils Relative: 0 %
Eosinophils Absolute: 0.2 10*3/uL (ref 0.0–0.7)
Eosinophils Relative: 2 %
HEMATOCRIT: 39.4 % (ref 36.0–46.0)
HEMOGLOBIN: 13.1 g/dL (ref 12.0–15.0)
LYMPHS ABS: 2.3 10*3/uL (ref 0.7–4.0)
LYMPHS PCT: 29 %
MCH: 31.4 pg (ref 26.0–34.0)
MCHC: 33.2 g/dL (ref 30.0–36.0)
MCV: 94.5 fL (ref 78.0–100.0)
Monocytes Absolute: 0.8 10*3/uL (ref 0.1–1.0)
Monocytes Relative: 11 %
NEUTROS ABS: 4.6 10*3/uL (ref 1.7–7.7)
NEUTROS PCT: 58 %
Platelets: 95 10*3/uL — ABNORMAL LOW (ref 150–400)
RBC: 4.17 MIL/uL (ref 3.87–5.11)
RDW: 12.5 % (ref 11.5–15.5)
WBC: 7.8 10*3/uL (ref 4.0–10.5)

## 2018-01-25 LAB — URINALYSIS, ROUTINE W REFLEX MICROSCOPIC
Bilirubin Urine: NEGATIVE
GLUCOSE, UA: NEGATIVE mg/dL
HGB URINE DIPSTICK: NEGATIVE
Ketones, ur: NEGATIVE mg/dL
NITRITE: NEGATIVE
PH: 6 (ref 5.0–8.0)
Protein, ur: NEGATIVE mg/dL
Specific Gravity, Urine: 1.017 (ref 1.005–1.030)

## 2018-01-25 LAB — BASIC METABOLIC PANEL
ANION GAP: 10 (ref 5–15)
BUN: 13 mg/dL (ref 8–23)
CHLORIDE: 106 mmol/L (ref 98–111)
CO2: 28 mmol/L (ref 22–32)
Calcium: 9.9 mg/dL (ref 8.9–10.3)
Creatinine, Ser: 0.88 mg/dL (ref 0.44–1.00)
GFR calc Af Amer: >60 mL/min (ref >60)
GFR calc non Af Amer: >60 mL/min (ref >60)
Glucose, Bld: 103 mg/dL — ABNORMAL HIGH (ref 70–99)
Potassium: 3.8 mmol/L (ref 3.5–5.1)
Sodium: 144 mmol/L (ref 135–145)

## 2018-01-25 LAB — RAPID URINE DRUG SCREEN, HOSP PERFORMED
Amphetamines: NOT DETECTED
BARBITURATES: NOT DETECTED
BENZODIAZEPINES: NOT DETECTED
Cocaine: NOT DETECTED
Opiates: NOT DETECTED
Tetrahydrocannabinol: NOT DETECTED

## 2018-01-25 LAB — ACETAMINOPHEN LEVEL: Acetaminophen (Tylenol), Serum: <10 ug/mL — ABNORMAL LOW (ref 10–30)

## 2018-01-25 LAB — SALICYLATE LEVEL: Salicylate Lvl: <7 mg/dL (ref 2.8–30.0)

## 2018-01-25 LAB — ETHANOL

## 2018-01-25 NOTE — ED Notes (Signed)
Bed: WA27 Expected date:  Expected time:  Means of arrival:  Comments: 

## 2018-01-25 NOTE — ED Triage Notes (Signed)
Patient states she has plenty of pain medicines that she can use to overdose.

## 2018-01-25 NOTE — Progress Notes (Signed)
BH MD/PA/NP OP Progress Note  01/25/2018 9:32 AM Allison Sharp  MRN:  [id number]  Chief Complaint: med management  HPI: Allison Sharp presents with her niece. Acutely suicidal. Has plan to overdose on meds.  agreable to voluntary psych admission.   Visit Diagnosis:    ICD-10-CM   1. Suicidal thoughts R45.851   2. Mixed bipolar I disorder (HCC) F31.60     Past Psychiatric History: See intake H&P for full details. Reviewed, with no updates at this time.  Past Medical History:  Past Medical History:  Diagnosis Date  . Allergy   . Anxiety   . Arthritis   . Asthma   . Bipolar 1 disorder ([place])   . CAD (coronary artery disease)   . Cellulitis   . CHF (congestive heart failure) (HCC)    diastolic dysfunction  . Chronic back pain   . Chronic headaches   . Complication of anesthesia    States she typically gets sick s/p anesthesia  . Contusion of sacrum   . COPD (chronic obstructive pulmonary disease) ([place])   . Depression   . Dyspnea    PFT 03/05/09 FEV1 2.77(98%), FVC 3.25(86%), FEV1% 85, TLC 5.88(99%), DLCO 60% ,  Methacholine challenge 03/16/09 normal ,  CT chest 03/12/09 no pulmonary disease  . Fungal infection   . GERD (gastroesophageal reflux disease)   . History of colonoscopy 10/17/2002   by Dr Rehman-> distal non-specific proctitis, small ext hemorrhoids,   . HTN (hypertension)   . Hyperlipidemia   . Hyperthyroidism   . Hypothyroidism    States she only has hyperthyroidism  . Migraine headache   . Morbid obesity with body mass index of 50.0-59.9 in adult [facility]) JAN 2011 370 LBS   2004 311 BMI 45.9  . Myocardial infarction ([place])    NOV 1997  . OSA on CPAP    she had been on 2L O2 at night but that was stopped  . PONV (postoperative nausea and vomiting)   . PTSD (post-traumatic stress disorder)   . Suicidal ideation   . Urine incontinence   . Vitamin D deficiency     Past Surgical History:  Procedure Laterality Date  . ABDOMINAL HYSTERECTOMY     sept 1996   . APPENDECTOMY    . BACK SURGERY  2008  . CARDIAC CATHETERIZATION     nov 1997  . CHOLECYSTECTOMY    . COLONOSCOPY  10/17/2002    Distal proctitis, small external hemorrhoids, otherwise/  normal colonoscopy. Suspect rectal bleeding secondary to hemorrhoids  . ESOPHAGOGASTRODUODENOSCOPY  03/18/09   fundic gland polyps/mild gastritis  . HERNIA REPAIR  1978  . JOINT REPLACEMENT     bil knee replacement  . KNEE ARTHROSCOPY    . MULTIPLE EXTRACTIONS WITH ALVEOLOPLASTY N/A 08/16/2015   Procedure: EXTRACTION OF TEETH THREE, SIX, EIGHT, NINE, ELEVEN, FOURTEEN, FIFTEEN, TWENTY-EIGHT WITH ALVEOLOPLASTY;  Surgeon: Diona Browner, DDS;  Location: [place];  Service: Oral Surgery;  Laterality: N/A;  . TONSILLECTOMY    . TOTAL VAGINAL HYSTERECTOMY    . TUBAL LIGATION      Family Psychiatric History: See intake H&P for full details. Reviewed, with no updates at this time.   Family History:  Family History  Problem Relation Age of Onset  . Hypertension Mother   . Bipolar disorder Mother   . Dementia Mother   . Depression Mother   . Heart attack Mother   . AAA (abdominal aortic aneurysm) Mother   . Coronary artery disease Father   .  Alcohol abuse Father   . Hypertension Brother   . Coronary artery disease Brother   . Bipolar disorder Brother   . Depression Brother   . Depression Sister   . Paranoid behavior Sister   . Cancer Sister        breast  . Bipolar disorder Sister   . Depression Sister   . Hypertension Sister   . Cancer Son        thyroid  . Cancer Maternal Aunt        breast metastatized to brain  . Anesthesia problems Neg Hx   . Hypotension Neg Hx   . Malignant hyperthermia Neg Hx   . Pseudochol deficiency Neg Hx     Social History:  Social History   Socioeconomic History  . Marital status: Single    Spouse name: Not on file  . Number of children: 2  . Years of education: Not on file  . Highest education level: Not on file  Occupational History  . Occupation:  Disabled    Fish farm manager: UNEMPLOYED    Comment: back problems  Social Needs  . Financial resource strain: Not on file  . Food insecurity:    Worry: Not on file    Inability: Not on file  . Transportation needs:    Medical: Not on file    Non-medical: Not on file  Tobacco Use  . Smoking status: Never Smoker  . Smokeless tobacco: Never Used  Substance and Sexual Activity  . Alcohol use: No    Alcohol/week: 0.0 standard drinks  . Drug use: No  . Sexual activity: Never    Birth control/protection: Abstinence  Lifestyle  . Physical activity:    Days per week: Not on file    Minutes per session: Not on file  . Stress: Not on file  Relationships  . Social connections:    Talks on phone: Not on file    Gets together: Not on file    Attends religious service: Not on file    Active member of club or organization: Not on file    Attends meetings of clubs or organizations: Not on file    Relationship status: Not on file  Other Topics Concern  . Not on file  Social History Narrative   Allison Sharp is disabled and lives with her sister, Allison Sharp. She has another sister with whom she has lived with in the past, and who is a Designer, jewellery, but not currently practicing.   She has two grown sons that she does not see often, as they live in other states. She has 5 grand children.   Allison Sharp has a long history of mental illness including depression, PTSD, suicidal and homicidal ideation.   She has been obese most all of her life. Her weight has significantly impacted her QOL. She recently lost 20 lbs. By decreasing portion size & increasing proteins.     Allergies:  Allergies  Allergen Reactions  . Abilify [Aripiprazole] Other (See Comments)    hallucinations  . Naproxen Nausea And Vomiting and Swelling  . Ativan [Lorazepam] Other (See Comments)    Delirium  . Haldol [Haloperidol] Other (See Comments)    Hallucinating   . Hydroxyzine Other (See Comments)    hallucinations    Metabolic  Disorder Labs: Lab Results  Component Value Date   HGBA1C 5.4 10/22/2017   MPG 108.28 10/22/2017   MPG 105 04/05/2017   No results found for: PROLACTIN Lab Results  Component Value Date  CHOL 236 (H) 07/02/2015   TRIG 181 (H) 07/02/2015   HDL 39 (L) 07/02/2015   CHOLHDL 6.1 (H) 07/02/2015   VLDL 30.0 12/10/2013   LDLCALC 161 (H) 07/02/2015   LDLCALC 89 12/10/2013   Lab Results  Component Value Date   TSH 5.634 (H) 09/27/2017   TSH 1.950 05/04/2017    Therapeutic Level Labs: No results found for: LITHIUM Lab Results  Component Value Date   VALPROATE 62 10/22/2017   VALPROATE 59 09/27/2017   No components found for:  CBMZ  Current Medications: Current Outpatient Medications  Medication Sig Dispense Refill  . albuterol (PROVENTIL) (2.5 MG/3ML) 0.083% nebulizer solution Take 3 mLs (2.5 mg total) by nebulization every 4 (four) hours as needed for wheezing or shortness of breath. 75 mL 0  . atorvastatin (LIPITOR) 20 MG tablet TAKE 1 TABLET DAILY 90 tablet 0  . benztropine (COGENTIN) 1 MG tablet Take 1 tablet (1 mg total) by mouth at bedtime. 90 tablet 0  . BREO ELLIPTA 100-25 MCG/INH AEPB INHALE 1 PUFF INTO THE LUNGS DAILY (Patient taking differently: Inhale 1 puff into the lungs daily as needed (sob and wheezing). ) 60 each 5  . celecoxib (CELEBREX) 200 MG capsule TAKE  (1)  CAPSULE  TWICE DAILY. (Patient taking differently: Take 200 mg by mouth 2 (two) times daily. ) 60 capsule 2  . cetirizine (ZYRTEC) 10 MG tablet TAKE 1 TABLET ONCE DAILY 30 tablet 11  . cloNIDine (CATAPRES) 0.1 MG tablet TAKE ONE TABLET AT BEDTIME 30 tablet 5  . diclofenac sodium (VOLTAREN) 1 % GEL Apply to affected area twice daily as needed (Patient taking differently: Apply 2 g topically 2 (two) times daily as needed (PAIN). ) 100 g 5  . divalproex (DEPAKOTE) 500 MG DR tablet Take 3 tablets (1,500 mg total) by mouth at bedtime. 270 tablet 0  . DULoxetine (CYMBALTA) 60 MG capsule Take 1 capsule (60 mg  total) by mouth daily. 90 capsule 0  . fluticasone (FLONASE) 50 MCG/ACT nasal spray Place 1 spray 2 (two) times daily as needed into both nostrils for allergies or rhinitis. (Patient not taking: Reported on 01/23/2018) 16 g 6  . levothyroxine (SYNTHROID, LEVOTHROID) 125 MCG tablet TAKE (1) TABLET DAILY BE- FORE BREAKFAST. (Patient taking differently: Take 125 mcg by mouth daily before breakfast. ) 30 tablet 11  . nystatin cream (MYCOSTATIN) APPLY TO THE AFFECTED AREA THREE TIMES DAILY AS NEEDED .Marland KitchenFOR TOPICAL USE ONLY... (Patient not taking: Reported on 01/23/2018) 30 g 0  . omeprazole (PRILOSEC) 20 MG capsule TAKE (1) CAPSULE DAILY (Patient taking differently: Take 20 mg by mouth daily. ) 30 capsule 4  . ondansetron (ZOFRAN-ODT) 4 MG disintegrating tablet DISSOLVE 1 TABLET IN MOUTH EVERY 8 HOURS AS NEEDED FOR NAUSEA AND VOMITING (Patient taking differently: Take 4 mg by mouth every 8 (eight) hours as needed for nausea or vomiting. ) 20 tablet 0  . polyethylene glycol powder (GLYCOLAX/MIRALAX) powder Take 17 g by mouth 2 (two) times daily as needed. (Patient taking differently: Take 17 g by mouth 2 (two) times daily as needed for moderate constipation. ) 3350 g 1  . PROAIR HFA 108 (90 Base) MCG/ACT inhaler USE 2 PUFF EVERY 4 HOURS AS NEEDED FOR WHEEZING OR SHORTNESS OF BREATH (Patient taking differently: Inhale 2 puffs into the lungs every 6 (six) hours as needed for wheezing or shortness of breath. ) 8.5 g 5  . promethazine (PHENERGAN) 25 MG tablet Take 1 tablet (25 mg total) by mouth every  8 (eight) hours as needed for nausea or vomiting. (Patient not taking: Reported on 01/23/2018) 20 tablet 0  . risperidone (RISPERDAL) 4 MG tablet Take 1 tablet (4 mg total) by mouth at bedtime. 90 tablet 0  . traMADol (ULTRAM) 50 MG tablet Take 1 tablet (50 mg total) by mouth every 8 (eight) hours as needed for moderate pain. 90 tablet 2   No current facility-administered medications for this visit.      Musculoskeletal: Strength & Muscle Tone: within normal limits Gait & Station: normal Patient leans: N/A  Psychiatric Specialty Exam: ROS  Last menstrual period 02/18/1995.There is no height or weight on file to calculate BMI.  General Appearance: Casual and Fairly Groomed  Eye Contact:  Fair  Speech:  Clear and Coherent and Normal Rate  Volume:  Normal  Mood:  Depressed and Dysphoric  Affect:  Appropriate and Congruent  Thought Process:  Coherent and Descriptions of Associations: Intact  Orientation:  Full (Time, Place, and Person)  Thought Content: Logical   Suicidal Thoughts:  No  Homicidal Thoughts:  No  Memory:  Immediate;   Fair  Judgement:  Fair  Insight:  Fair  Psychomotor Activity:  Normal  Concentration:  Concentration: Fair  Recall:  [employer] of Knowledge: Fair  Language: Fair  Akathisia:  Negative  Handed:  Right  AIMS (if indicated): not done  Assets:  Communication Skills Desire for Improvement Financial Resources/Insurance Housing  ADL's:  Intact  Cognition: WNL  Sleep:  Fair   Screenings: AIMS     Admission (Discharged) from 01/17/2015 in [place] [facility]  AIMS Total Score  0    AUDIT     Admission (Discharged) from 01/17/2015 in [place] [facility] Admission (Discharged) from 10/24/2012 in [place] [facility]  Alcohol Use Disorder Identification Test Final Score (AUDIT)  0  0    Mini-Mental     Clinical Support from 11/08/2016 in [place] from 06/28/2015 in [place]  Total Score (max 30 points )  30  29    PHQ2-9     Office Visit from 10/08/2017 in [place] Visit from 07/20/2017 in [place] Office Visit from 07/02/2017 in [place] Office Visit from 05/04/2017 in [place] Office Visit from  04/16/2017 in [place]  PHQ-2 Total Score  0  0  0  0  1       Assessment and Plan: MATHEA FRIELING presents with suicidal ideation with a plan to overdose on medications. She continues to struggle with depressed mood, hopelessness.  Her niece is here and alarmed and frustrated that she was not hospitalized yesterday after being seen.  Family is needing to restrain the patient at night in hospital bed out of concerns that she would wander away.    Recommend psychiatric hospitalization and consideration for geriatric nursing home placement.   Patient has an incredible number of risk factors and is very high risk for harm to self.  Ongoing AH, VH, SI with plan, chronic illness, chronic pain and past attempts.   1. Suicidal thoughts   2. Mixed bipolar I disorder ([place])     Status of current problems: ongoing symptoms  Labs Ordered: No orders of the defined types were placed in this encounter.   Labs Reviewed: na  Collateral Obtained/Records Reviewed: niece is present  Plan:  Recommend psychiatric  hospitalization - patient is agreeable voluntary admission     Aundra Dubin, MD 01/25/2018, 9:32 AM

## 2018-01-25 NOTE — ED Notes (Signed)
RN made aware per lab, lavender tube needs to be recollected.

## 2018-01-25 NOTE — BH Assessment (Signed)
[facility] Assessment Progress Note 01/25/18: Patient was seen earlier this date and assessed at [facility] as a walk in. Disposition was recommended at that time for a inpatient admission and patient was transported to [facility] for medical clearance. A TTS consult was put in at 1700 hours by EDP at [facility] due to no notification per chart review, that patient had been assessed earlier. This Probation officer completed assessment although deleted narrative, status pending.

## 2018-01-25 NOTE — Progress Notes (Signed)
01/25/18  Patient O2 tank in locker #[id number]

## 2018-01-25 NOTE — H&P (Signed)
Behavioral Health Medical Screening Exam  Allison Sharp is an 62 y.o. female.  Total Time spent with patient: 30 minutes  Psychiatric Specialty Exam: Physical Exam  Nursing note and vitals reviewed. Constitutional: She is oriented to person, place, and time. She appears well-developed and well-nourished.  Cardiovascular: Normal rate.  Respiratory: Effort normal.  Musculoskeletal: Normal range of motion.  Neurological: She is alert and oriented to person, place, and time.  Skin: Skin is warm.    Review of Systems  Constitutional: Negative.   HENT: Negative.   Eyes: Negative.   Respiratory: Negative.   Cardiovascular: Negative.   Gastrointestinal: Negative.   Genitourinary: Negative.   Musculoskeletal: Negative.   Skin: Negative.   Neurological: Negative.   Endo/Heme/Allergies: Negative.   Psychiatric/Behavioral: Positive for depression and suicidal ideas.    Blood pressure (!) 128/91, pulse 88, temperature 98.2 F (36.8 C), resp. rate 18, last menstrual period 02/18/1995, SpO2 100 %.There is no height or weight on file to calculate BMI.  General Appearance: Casual  Eye Contact:  Good  Speech:  Clear and Coherent and Normal Rate  Volume:  Normal  Mood:  Depressed  Affect:  Flat  Thought Process:  Linear and Descriptions of Associations: Intact  Orientation:  Full (Time, Place, and Person)  Thought Content:  WDL  Suicidal Thoughts:  Yes.  with intent/plan  Homicidal Thoughts:  No  Memory:  Immediate;   Good Recent;   Good Remote;   Good  Judgement:  Fair  Insight:  Fair  Psychomotor Activity:  Normal  Concentration: Concentration: Good and Attention Span: Good  Recall:  Good  Fund of Knowledge:Good  Language: Good  Akathisia:  No  Handed:  Right  AIMS (if indicated):     Assets:  Desire for Improvement Financial Resources/Insurance Housing Social Support  Sleep:       Musculoskeletal: Strength & Muscle Tone: decreased Gait & Station: unsteady,  shuffle Patient leans: N/A  Blood pressure (!) 128/91, pulse 88, temperature 98.2 F (36.8 C), resp. rate 18, last menstrual period 02/18/1995, SpO2 100 %.  Recommendations:  Based on my evaluation the patient does not appear to have an emergency medical condition. But will be transferred to [facility] to await placement  Lewis Shock, FNP 01/25/2018, 12:55 PM

## 2018-01-25 NOTE — Progress Notes (Signed)
Pt referred to the following hospitals for review: [facility] Office  [facility]  [place]  [facility]   Rhae Hammock Disposition [facility]/TTS [phone]

## 2018-01-25 NOTE — Progress Notes (Signed)
TTS received a call from [place] at [facility] inquiring if the pt still needs placement.   Lind Covert, MSW, LCSW Therapeutic Triage Specialist  [phone]

## 2018-01-25 NOTE — BH Assessment (Signed)
Assessment Note  Allison Sharp is an 62 y.o. female who presents as a walk-in after leaving her doctors appointment this morning with Dr. Daron Offer. Patient was discharged yesterday 01/24/2018 from [place], however, patient continues to report feeling suicidal with a plan to overdose triggered by symptoms related to dementia. Patient report her suicidal ideations has not decreased. Patient report she continues to feel suicidal and reports auditory and visual hallucinations. Patient was accompanied by her niece.   Diagnosis: F33.2  Major depressive disorder, Recurrent episode, Severe  Past Medical History:  Past Medical History:  Diagnosis Date  . Allergy   . Anxiety   . Arthritis   . Asthma   . Bipolar 1 disorder ([place])   . CAD (coronary artery disease)   . Cellulitis   . CHF (congestive heart failure) (H[facility])    diastolic dysfunction  . Chronic back pain   . Chronic headaches   . Complication of anesthesia    States she typically gets sick s/p anesthesia  . Contusion of sacrum   . COPD (chronic obstructive pulmonary disease) ([place])   . Depression   . Dyspnea    PFT 03/05/09 FEV1 2.77(98%), FVC 3.25(86%), FEV1% 85, TLC 5.88(99%), DLCO 60% ,  Methacholine challenge 03/16/09 normal ,  CT chest 03/12/09 no pulmonary disease  . Fungal infection   . GERD (gastroesophageal reflux disease)   . History of colonoscopy 10/17/2002   by Dr Rehman-> distal non-specific proctitis, small ext hemorrhoids,   . HTN (hypertension)   . Hyperlipidemia   . Hyperthyroidism   . Hypothyroidism    States she only has hyperthyroidism  . Migraine headache   . Morbid obesity with body mass index of 50.0-59.9 in adult [facility]) JAN 2011 370 LBS   2004 311 BMI 45.9  . Myocardial infarction ([place])    NOV 1997  . OSA on CPAP    she had been on 2L O2 at night but that was stopped  . PONV (postoperative nausea and vomiting)   . PTSD (post-traumatic stress disorder)   . Suicidal ideation   . Urine incontinence   .  Vitamin D deficiency     Past Surgical History:  Procedure Laterality Date  . ABDOMINAL HYSTERECTOMY     sept 1996  . APPENDECTOMY    . BACK SURGERY  2008  . CARDIAC CATHETERIZATION     nov 1997  . CHOLECYSTECTOMY    . COLONOSCOPY  10/17/2002    Distal proctitis, small external hemorrhoids, otherwise/  normal colonoscopy. Suspect rectal bleeding secondary to hemorrhoids  . ESOPHAGOGASTRODUODENOSCOPY  03/18/09   fundic gland polyps/mild gastritis  . HERNIA REPAIR  1978  . JOINT REPLACEMENT     bil knee replacement  . KNEE ARTHROSCOPY    . MULTIPLE EXTRACTIONS WITH ALVEOLOPLASTY N/A 08/16/2015   Procedure: EXTRACTION OF TEETH THREE, SIX, EIGHT, NINE, ELEVEN, FOURTEEN, FIFTEEN, TWENTY-EIGHT WITH ALVEOLOPLASTY;  Surgeon: Diona Browner, DDS;  Location: [facility];  Service: Oral Surgery;  Laterality: N/A;  . TONSILLECTOMY    . TOTAL VAGINAL HYSTERECTOMY    . TUBAL LIGATION      Family History:  Family History  Problem Relation Age of Onset  . Hypertension Mother   . Bipolar disorder Mother   . Dementia Mother   . Depression Mother   . Heart attack Mother   . AAA (abdominal aortic aneurysm) Mother   . Coronary artery disease Father   . Alcohol abuse Father   . Hypertension Brother   . Coronary artery disease  Brother   . Bipolar disorder Brother   . Depression Brother   . Depression Sister   . Paranoid behavior Sister   . Cancer Sister        breast  . Bipolar disorder Sister   . Depression Sister   . Hypertension Sister   . Cancer Son        thyroid  . Cancer Maternal Aunt        breast metastatized to brain  . Anesthesia problems Neg Hx   . Hypotension Neg Hx   . Malignant hyperthermia Neg Hx   . Pseudochol deficiency Neg Hx     Social History:  reports that she has never smoked. She has never used smokeless tobacco. She reports that she does not drink alcohol or use drugs.  Additional Social History:  Alcohol / Drug Use Pain Medications: see MAR Prescriptions: see  MAR Over the Counter: see MAR History of alcohol / drug use?: No history of alcohol / drug abuse Longest period of sobriety (when/how long): n/a  CIWA: CIWA-Ar BP: (!) 128/91 Pulse Rate: 88 COWS:    Allergies:  Allergies  Allergen Reactions  . Abilify [Aripiprazole] Other (See Comments)    hallucinations  . Naproxen Nausea And Vomiting and Swelling  . Ativan [Lorazepam] Other (See Comments)    Delirium  . Haldol [Haloperidol] Other (See Comments)    Hallucinating   . Hydroxyzine Other (See Comments)    hallucinations    Home Medications:  (Not in a hospital admission)  OB/GYN Status:  Patient's last menstrual period was 02/18/1995.  General Assessment Data Location of Assessment: [facility](walk-in) TTS Assessment: In system Is this a Tele or Face-to-Face Assessment?: Face-to-Face Is this an Initial Assessment or a Re-assessment for this encounter?: Initial Assessment Marital status: Widowed [place] name: [name] Pregnancy Status: No Living Arrangements: Other relatives(pt lives with niece) Can pt return to current living arrangement?: Yes Admission Status: Voluntary Is patient capable of signing voluntary admission?: Yes Referral Source: Self/Family/Friend Insurance type: [employer]     Crisis Care Plan Living Arrangements: Other relatives(pt lives with niece) Name of Psychiatrist: Dr. Adele Schilder Name of Therapist: None  Education Status Is patient currently in school?: No Is the patient employed, unemployed or receiving disability?: Receiving disability income  Risk to self with the past 6 months Suicidal Ideation: Yes-Currently Present Has patient been a risk to self within the past 6 months prior to admission? : Yes Suicidal Intent: Yes-Currently Present Has patient had any suicidal intent within the past 6 months prior to admission? : No Is patient at risk for suicide?: Yes Suicidal Plan?: Yes-Currently Present Has patient had  any suicidal plan within the past 6 months prior to admission? : No Specify Current Suicidal Plan: overdose on medication  Access to Means: Yes Specify Access to Suicidal Means: Medications  What has been your use of drugs/alcohol within the last 12 months?: None Previous Attempts/Gestures: Yes How many times?: 2 Other Self Harm Risks: None Triggers for Past Attempts: Other (Comment)(physical pain ) Intentional Self Injurious Behavior: None Family Suicide History: No Recent stressful life event(s): Recent negative physical changes(hearing voices ) Persecutory voices/beliefs?: No Depression: Yes Depression Symptoms: Despondent, Fatigue, Feeling worthless/self pity, Isolating Substance abuse history and/or treatment for substance abuse?: No Suicide prevention information given to non-admitted patients: Not applicable  Risk to Others within the past 6 months Homicidal Ideation: No Does patient have any lifetime risk of violence toward others beyond the six months prior to admission? :  No Thoughts of Harm to Others: No Current Homicidal Intent: No Current Homicidal Plan: No Access to Homicidal Means: No Identified Victim: none reported History of harm to others?: No Assessment of Violence: None Noted Violent Behavior Description: None reported Does patient have access to weapons?: No Criminal Charges Pending?: No Does patient have a court date: No Is patient on probation?: No  Psychosis Hallucinations: Auditory, Visual Delusions: None noted  Mental Status Report Appearance/Hygiene: Unremarkable, In hospital gown Eye Contact: Good Motor Activity: Freedom of movement Speech: Logical/coherent, Soft Level of Consciousness: Alert Mood: Depressed, Other (Comment)(suicidal ) Affect: Depressed, Other (Comment)(suicidal ) Anxiety Level: Moderate Thought Processes: Coherent, Relevant Judgement: Impaired Orientation: Person, Place, Situation Obsessive Compulsive Thoughts/Behaviors:  Minimal  Cognitive Functioning Concentration: Decreased Memory: Recent Intact, Remote Intact Is patient IDD: No Insight: Good Impulse Control: Fair Appetite: Poor Have you had any weight changes? : No Change Sleep: Decreased Vegetative Symptoms: Staying in bed  ADLScreening [facility] Assessment Services) Patient's cognitive ability adequate to safely complete daily activities?: Yes Patient able to express need for assistance with ADLs?: Yes Independently performs ADLs?: No  Prior Inpatient Therapy Prior Inpatient Therapy: Yes Prior Therapy Dates: Jan '18 Prior Therapy Facilty/Provider(s): [facility] Reason for Treatment: depression  Prior Outpatient Therapy Prior Outpatient Therapy: Yes Prior Therapy Dates: Current Prior Therapy Facilty/Provider(s): Dr. Adele Schilder Reason for Treatment: med management Does patient have an ACCT team?: No Does patient have Intensive In-House Services?  : No Does patient have [facility] services? : No Does patient have P4CC services?: No  ADL Screening (condition at time of admission) Patient's cognitive ability adequate to safely complete daily activities?: Yes Is the patient deaf or have difficulty hearing?: Yes Does the patient have difficulty seeing, even when wearing glasses/contacts?: Yes Does the patient have difficulty concentrating, remembering, or making decisions?: Yes Patient able to express need for assistance with ADLs?: Yes Does the patient have difficulty dressing or bathing?: Yes Independently performs ADLs?: No       Abuse/Neglect Assessment (Assessment to be complete while patient is alone) Abuse/Neglect Assessment Can Be Completed: Yes Physical Abuse: Denies Verbal Abuse: Yes, past (Comment) Sexual Abuse: Denies Exploitation of patient/patient's resources: Denies Self-Neglect: Denies     Regulatory affairs officer (For Healthcare) Does Patient Have a Medical Advance Directive?: No Would patient like information on creating a medical  advance directive?: No - Patient declined          Disposition:  Disposition Initial Assessment Completed for this Encounter: Yes(Travis Money, recommend inpt tx) Disposition of Patient: Movement to [facility] or [facility] ED(Travis Money, NP, recommend inpt. transfer to [facility] )  On Site Evaluation by:   Reviewed with Physician:    Despina Hidden 01/25/2018 11:53 AM

## 2018-01-25 NOTE — ED Provider Notes (Signed)
[facility] DEPT Provider Note   CSN: [id number] Arrival date & time: 01/25/18  1212     History   Chief Complaint Chief Complaint  Patient presents with  . Suicide Attempt    HPI Allison Sharp is a 62 y.o. female.  62 year old female with prior medical history as detailed below presents with complaint of SI with plan to overdose. She was seen today by her psychiatrist for same. She was sent to the ED for likely admission for psychiatric admission.   Patient denies associated medical complaint.   The history is provided by the patient and medical records.  Mental Health Problem  Presenting symptoms: suicidal thoughts   Degree of incapacity (severity):  Moderate Onset quality:  Unable to specify Timing:  Constant Progression:  Worsening Chronicity:  New Relieved by:  Nothing Worsened by:  Nothing   Past Medical History:  Diagnosis Date  . Allergy   . Anxiety   . Arthritis   . Asthma   . Bipolar 1 disorder ([place])   . CAD (coronary artery disease)   . Cellulitis   . CHF (congestive heart failure) (HCC)    diastolic dysfunction  . Chronic back pain   . Chronic headaches   . Complication of anesthesia    States she typically gets sick s/p anesthesia  . Contusion of sacrum   . COPD (chronic obstructive pulmonary disease) ([place])   . Depression   . Dyspnea    PFT 03/05/09 FEV1 2.77(98%), FVC 3.25(86%), FEV1% 85, TLC 5.88(99%), DLCO 60% ,  Methacholine challenge 03/16/09 normal ,  CT chest 03/12/09 no pulmonary disease  . Fungal infection   . GERD (gastroesophageal reflux disease)   . History of colonoscopy 10/17/2002   by Dr Rehman-> distal non-specific proctitis, small ext hemorrhoids,   . HTN (hypertension)   . Hyperlipidemia   . Hyperthyroidism   . Hypothyroidism    States she only has hyperthyroidism  . Migraine headache   . Morbid obesity with body mass index of 50.0-59.9 in adult [facility]) JAN 2011 370 LBS   2004 311 BMI 45.9    . Myocardial infarction ([place])    NOV 1997  . OSA on CPAP    she had been on 2L O2 at night but that was stopped  . PONV (postoperative nausea and vomiting)   . PTSD (post-traumatic stress disorder)   . Suicidal ideation   . Urine incontinence   . Vitamin D deficiency     Patient Active Problem List   Diagnosis Date Noted  . Orthostatic hypotension 10/24/2017  . Fatty liver 10/24/2017  . Thrombocytopenia ([place]) 09/29/2017  . Visual hallucinations   . Auditory hallucination   . Acute encephalopathy 09/26/2017  . Lower urinary tract infectious disease 09/26/2017  . Bipolar I disorder, most recent episode depressed, severe w psychosis ([place]) 06/21/2016  . History of MI (myocardial infarction) 06/21/2016  . Spinal stenosis of lumbar region with neurogenic claudication 03/29/2016  . Severe obesity (BMI >= 40) ([place]) 07/05/2015  . Hyperlipidemia LDL goal <130 07/05/2015  . MDD (major depressive disorder), single episode, severe with psychotic features ([place]) 01/21/2015  . 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 Bilateral knee pain 01/07/2014  . Scar condition and fibrosis of skin 12/11/2013  . Essential hypertension, benign 07/01/2013  . Asthma with acute exacerbation 11/04/2012  . OSA on CPAP 11/04/2012  . Back pain, chronic 08/27/2012  . Vitamin D insufficiency 04/04/2012  . Insomnia due to mental disorder 04/04/2012  .  Anxiety 04/04/2012  . OCD (obsessive compulsive disorder) 04/04/2012  . PTSD (post-traumatic stress disorder) 06/28/2011  . Coronary artery disease 08/26/2010  . FATTY LIVER DISEASE 03/29/2009  . GERD 03/01/2009  . Morbid obesity ([place]) 02/15/2009  . Hypothyroidism 02/12/2009  . HYPERCHOLESTEROLEMIA 02/12/2009    Past Surgical History:  Procedure Laterality Date  . ABDOMINAL HYSTERECTOMY     sept 1996  . APPENDECTOMY    . BACK SURGERY  2008  . CARDIAC CATHETERIZATION     nov 1997  . CHOLECYSTECTOMY    . COLONOSCOPY  10/17/2002    Distal proctitis, small  external hemorrhoids, otherwise/  normal colonoscopy. Suspect rectal bleeding secondary to hemorrhoids  . ESOPHAGOGASTRODUODENOSCOPY  03/18/09   fundic gland polyps/mild gastritis  . HERNIA REPAIR  1978  . JOINT REPLACEMENT     bil knee replacement  . KNEE ARTHROSCOPY    . MULTIPLE EXTRACTIONS WITH ALVEOLOPLASTY N/A 08/16/2015   Procedure: EXTRACTION OF TEETH THREE, SIX, EIGHT, NINE, ELEVEN, FOURTEEN, FIFTEEN, TWENTY-EIGHT WITH ALVEOLOPLASTY;  Surgeon: Diona Browner, DDS;  Location: [facility];  Service: Oral Surgery;  Laterality: N/A;  . TONSILLECTOMY    . TOTAL VAGINAL HYSTERECTOMY    . TUBAL LIGATION       OB History   None      Home Medications    Prior to Admission medications   Medication Sig Start Date End Date Taking? Authorizing Provider  albuterol (PROVENTIL) (2.5 MG/3ML) 0.083% nebulizer solution Take 3 mLs (2.5 mg total) by nebulization every 4 (four) hours as needed for wheezing or shortness of breath. 06/02/16   Dettinger, Fransisca Kaufmann, MD  atorvastatin (LIPITOR) 20 MG tablet TAKE 1 TABLET DAILY 12/26/17   Dettinger, Fransisca Kaufmann, MD  benztropine (COGENTIN) 1 MG tablet Take 1 tablet (1 mg total) by mouth at bedtime. 11/01/17 11/01/18  Arfeen, Arlyce Harman, MD  BREO ELLIPTA 100-25 MCG/INH AEPB INHALE 1 PUFF INTO THE LUNGS DAILY Patient taking differently: Inhale 1 puff into the lungs daily as needed (sob and wheezing).  07/24/16   Dettinger, Fransisca Kaufmann, MD  celecoxib (CELEBREX) 200 MG capsule TAKE  (1)  CAPSULE  TWICE DAILY. Patient taking differently: Take 200 mg by mouth 2 (two) times daily.  11/21/17   Dettinger, Fransisca Kaufmann, MD  cetirizine (ZYRTEC) 10 MG tablet TAKE 1 TABLET ONCE DAILY 08/22/16   Dettinger, Fransisca Kaufmann, MD  cloNIDine (CATAPRES) 0.1 MG tablet TAKE ONE TABLET AT BEDTIME 11/21/17   Dettinger, Fransisca Kaufmann, MD  diclofenac sodium (VOLTAREN) 1 % GEL Apply to affected area twice daily as needed Patient taking differently: Apply 2 g topically 2 (two) times daily as needed (PAIN).  03/15/16    Dettinger, Fransisca Kaufmann, MD  divalproex (DEPAKOTE) 500 MG DR tablet Take 3 tablets (1,500 mg total) by mouth at bedtime. 11/01/17   Arfeen, Arlyce Harman, MD  DULoxetine (CYMBALTA) 60 MG capsule Take 1 capsule (60 mg total) by mouth daily. 11/01/17   Arfeen, Arlyce Harman, MD  fluticasone (FLONASE) 50 MCG/ACT nasal spray Place 1 spray 2 (two) times daily as needed into both nostrils for allergies or rhinitis. Patient not taking: Reported on 01/23/2018 04/16/17   Terald Sleeper, PA-C  levothyroxine (SYNTHROID, LEVOTHROID) 125 MCG tablet TAKE (1) TABLET DAILY BE- FORE BREAKFAST. Patient taking differently: Take 125 mcg by mouth daily before breakfast.  11/21/17   Dettinger, Fransisca Kaufmann, MD  nystatin cream (MYCOSTATIN) APPLY TO THE AFFECTED AREA THREE TIMES DAILY AS NEEDED .Marland KitchenFOR TOPICAL USE ONLY... Patient not taking: Reported on 01/23/2018  02/20/17   Dettinger, Fransisca Kaufmann, MD  omeprazole (PRILOSEC) 20 MG capsule TAKE (1) CAPSULE DAILY Patient taking differently: Take 20 mg by mouth daily.  12/06/17   Dettinger, Fransisca Kaufmann, MD  ondansetron (ZOFRAN-ODT) 4 MG disintegrating tablet DISSOLVE 1 TABLET IN MOUTH EVERY 8 HOURS AS NEEDED FOR NAUSEA AND VOMITING Patient taking differently: Take 4 mg by mouth every 8 (eight) hours as needed for nausea or vomiting.  01/02/18   Dettinger, Fransisca Kaufmann, MD  polyethylene glycol powder (GLYCOLAX/MIRALAX) powder Take 17 g by mouth 2 (two) times daily as needed. Patient taking differently: Take 17 g by mouth 2 (two) times daily as needed for moderate constipation.  06/07/17   Dettinger, Fransisca Kaufmann, MD  PROAIR HFA 108 [phone] Base) MCG/ACT inhaler USE 2 PUFF EVERY 4 HOURS AS NEEDED FOR WHEEZING OR SHORTNESS OF BREATH Patient taking differently: Inhale 2 puffs into the lungs every 6 (six) hours as needed for wheezing or shortness of breath.  07/24/16   Dettinger, Fransisca Kaufmann, MD  promethazine (PHENERGAN) 25 MG tablet Take 1 tablet (25 mg total) by mouth every 8 (eight) hours as needed for nausea or vomiting. Patient not  taking: Reported on 01/23/2018 07/05/17   Dettinger, Fransisca Kaufmann, MD  risperidone (RISPERDAL) 4 MG tablet Take 1 tablet (4 mg total) by mouth at bedtime. 11/01/17 11/01/18  Arfeen, Arlyce Harman, MD  traMADol (ULTRAM) 50 MG tablet Take 1 tablet (50 mg total) by mouth every 8 (eight) hours as needed for moderate pain. 10/31/17   Dettinger, Fransisca Kaufmann, MD    Family History Family History  Problem Relation Age of Onset  . Hypertension Mother   . Bipolar disorder Mother   . Dementia Mother   . Depression Mother   . Heart attack Mother   . AAA (abdominal aortic aneurysm) Mother   . Coronary artery disease Father   . Alcohol abuse Father   . Hypertension Brother   . Coronary artery disease Brother   . Bipolar disorder Brother   . Depression Brother   . Depression Sister   . Paranoid behavior Sister   . Cancer Sister        breast  . Bipolar disorder Sister   . Depression Sister   . Hypertension Sister   . Cancer Son        thyroid  . Cancer Maternal Aunt        breast metastatized to brain  . Anesthesia problems Neg Hx   . Hypotension Neg Hx   . Malignant hyperthermia Neg Hx   . Pseudochol deficiency Neg Hx     Social History Social History   Tobacco Use  . Smoking status: Never Smoker  . Smokeless tobacco: Never Used  Substance Use Topics  . Alcohol use: No    Alcohol/week: 0.0 standard drinks  . Drug use: No     Allergies   Abilify [aripiprazole]; Naproxen; Ativan [lorazepam]; Haldol [haloperidol]; and Hydroxyzine   Review of Systems Review of Systems  Psychiatric/Behavioral: Positive for suicidal ideas.  All other systems reviewed and are negative.    Physical Exam Updated Vital Signs BP (!) 148/87 (BP Location: Left Arm)   Pulse 82   Temp 97.7 F (36.5 C) (Oral)   Resp 20   Ht 5\' 9"  (1.753 m)   Wt (!) 175.1 kg   LMP 02/18/1995   SpO2 100%   BMI 57.00 kg/m   Physical Exam  Constitutional: She is oriented to person, place, and time. She appears well-developed and  well-nourished. No distress.  HENT:  Head: Normocephalic and atraumatic.  Mouth/Throat: Oropharynx is clear and moist.  Eyes: Pupils are equal, round, and reactive to light. Conjunctivae and EOM are normal.  Neck: Normal range of motion. Neck supple.  Cardiovascular: Normal rate, regular rhythm and normal heart sounds.  Pulmonary/Chest: Effort normal and breath sounds normal. No respiratory distress.  Abdominal: Soft. She exhibits no distension. There is no tenderness.  Musculoskeletal: Normal range of motion. She exhibits no edema or deformity.  Neurological: She is alert and oriented to person, place, and time.  Skin: Skin is warm and dry.  Psychiatric: She has a normal mood and affect.  Patient expresses SI with plan (overdose)   Nursing note and vitals reviewed.    ED Treatments / Results  Labs (all labs ordered are listed, but only abnormal results are displayed) Labs Reviewed  RAPID URINE DRUG SCREEN, HOSP PERFORMED  URINALYSIS, ROUTINE W REFLEX MICROSCOPIC  ACETAMINOPHEN LEVEL  BASIC METABOLIC PANEL  ETHANOL  SALICYLATE LEVEL  CBC WITH DIFFERENTIAL/PLATELET    EKG None  Radiology Dg Lumbar Spine Complete  Result Date: 01/23/2018 CLINICAL DATA:  Patient slipped on paper today now with back pain. EXAM: LUMBAR SPINE - COMPLETE 4+ VIEW COMPARISON:  CT abdomen and pelvis - 10/22/2017 FINDINGS: Examination is degraded due to patient body habitus. There are 5 non rib-bearing lumbar type vertebral bodies. Normal alignment of lumbar spine. No anterolisthesis or retrolisthesis. Post L4-S1 paraspinal fusion without evidence of hardware failure loosening. Lumbar vertebral body heights appear preserved Moderate to severe DDD of L3-L4 with disc space height loss, endplate irregularity and sclerosis. Limited visualization of the bilateral SI joints is normal. Large colonic stool burden without evidence of enteric obstruction. Post cholecystectomy. IMPRESSION: 1. No acute findings. 2.  Post L4-S1 paraspinal fusion without evidence of hardware failure loosening. 3. Moderate to severe DDD of L3-L4. Electronically Signed   By: Sandi Mariscal M.D.   On: 01/23/2018 15:56   Dg Knee Complete 4 Views Left  Result Date: 01/23/2018 CLINICAL DATA:  Patient slipped on paper at home, now with left knee pain. EXAM: LEFT KNEE - COMPLETE 4+ VIEW COMPARISON:  None. FINDINGS: No fracture or dislocation. Post left total knee replacement without evidence of hardware failure or loosening. No joint effusion. Regional soft tissues appear normal. IMPRESSION: 1. No acute findings. 2. Post left total knee replacement without evidence of hardware failure or loosening. Electronically Signed   By: Sandi Mariscal M.D.   On: 01/23/2018 15:57   Dg Knee Complete 4 Views Right  Result Date: 01/23/2018 CLINICAL DATA:  Slipped on paper at home now with right knee pain. EXAM: RIGHT KNEE - COMPLETE 4+ VIEW COMPARISON:  None. FINDINGS: Post right total knee replacement without evidence of hardware failure or loosening. No definite fracture or dislocation. No dislocation. No joint effusion. Regional soft tissues appear normal. IMPRESSION: 1. No acute findings. 2. Post right total knee replacement without evidence of hardware failure loosening. Electronically Signed   By: Sandi Mariscal M.D.   On: 01/23/2018 15:52   Dg Foot Complete Right  Result Date: 01/23/2018 CLINICAL DATA:  Slipped on paper at home, now with right foot pain EXAM: RIGHT FOOT COMPLETE - 3+ VIEW COMPARISON:  08/30/2016 FINDINGS: No fracture or dislocation. No significant hallux valgus deformity. Pes cavus deformity suspected on this nonweightbearing radiograph. Joint spaces are preserved. No erosions. Moderate-sized plantar calcaneal spur. Minimal enthesopathic change involving the Achilles tendon insertion site. Regional soft tissues appear normal. IMPRESSION: 1. No  acute findings. 2. Moderate-sized plantar calcaneal spur. Electronically Signed   By: Sandi Mariscal  M.D.   On: 01/23/2018 15:58    Procedures Procedures (including critical care time)  Medications Ordered in ED Medications - No data to display   Initial Impression / Assessment and Plan / ED Course  I have reviewed the triage vital signs and the nursing notes.  Pertinent labs & imaging results that were available during my care of the patient were reviewed by me and considered in my medical decision making (see chart for details).    MDM  Screen complete  Patient is presenting with expressed suicidal ideation. She may benefit from inpatient care.   Patient is medically clear at this time.   Final disposition is dependent upon psychiatric assessment.   Final Clinical Impressions(s) / ED Diagnoses   Final diagnoses:  Suicidal ideation    ED Discharge Orders    None       Valarie Merino, MD 01/25/18 1659

## 2018-01-26 DIAGNOSIS — Z818 Family history of other mental and behavioral disorders: Secondary | ICD-10-CM

## 2018-01-26 DIAGNOSIS — Z811 Family history of alcohol abuse and dependence: Secondary | ICD-10-CM

## 2018-01-26 DIAGNOSIS — F4310 Post-traumatic stress disorder, unspecified: Secondary | ICD-10-CM

## 2018-01-26 DIAGNOSIS — F315 Bipolar disorder, current episode depressed, severe, with psychotic features: Secondary | ICD-10-CM

## 2018-01-26 DIAGNOSIS — Z810 Family history of intellectual disabilities: Secondary | ICD-10-CM

## 2018-01-26 DIAGNOSIS — Z560 Unemployment, unspecified: Secondary | ICD-10-CM

## 2018-01-26 DIAGNOSIS — F419 Anxiety disorder, unspecified: Secondary | ICD-10-CM

## 2018-01-26 DIAGNOSIS — R45851 Suicidal ideations: Secondary | ICD-10-CM

## 2018-01-26 MED ORDER — DIVALPROEX SODIUM 500 MG PO DR TAB
1500.0000 mg | DELAYED_RELEASE_TABLET | Freq: Every day | ORAL | Status: DC
  Administered 2018-01-26: 1500 mg via ORAL
  Filled 2018-01-26: qty 3

## 2018-01-26 MED ORDER — LEVOTHYROXINE SODIUM 125 MCG PO TABS
125.0000 ug | ORAL_TABLET | Freq: Every day | ORAL | Status: DC
  Administered 2018-01-27: 125 ug via ORAL
  Filled 2018-01-26 (×2): qty 1

## 2018-01-26 MED ORDER — DULOXETINE HCL 30 MG PO CPEP
60.0000 mg | ORAL_CAPSULE | Freq: Every day | ORAL | Status: DC
  Administered 2018-01-26 – 2018-01-27 (×2): 60 mg via ORAL
  Filled 2018-01-26 (×2): qty 2

## 2018-01-26 MED ORDER — BENZTROPINE MESYLATE 1 MG PO TABS
1.0000 mg | ORAL_TABLET | Freq: Every day | ORAL | Status: DC
  Administered 2018-01-26: 1 mg via ORAL
  Filled 2018-01-26: qty 1

## 2018-01-26 MED ORDER — RISPERIDONE 2 MG PO TABS
2.0000 mg | ORAL_TABLET | Freq: Two times a day (BID) | ORAL | Status: DC
  Administered 2018-01-26 – 2018-01-27 (×3): 2 mg via ORAL
  Filled 2018-01-26 (×3): qty 1

## 2018-01-26 NOTE — ED Notes (Signed)
Respiratory made aware of pt's need for a CPAP at night.

## 2018-01-26 NOTE — Progress Notes (Addendum)
Patient has accepted bed offer. Will need [place] transportation.  Stephanie Acre, [place] Social Worker [phone]

## 2018-01-26 NOTE — Progress Notes (Signed)
CSW received call from [facility] intake. Patient offered admission  Accepting provider: Dr.Cornwall  RN Call for report: [place], [place] Worker [phone]

## 2018-01-26 NOTE — Consult Note (Addendum)
[facility] Consult   Reason for Consult:  Suicidal ideation Referring Physician:  EDP Patient Identification: Allison Sharp MRN:  [id number] Principal Diagnosis: Bipolar I disorder, most recent episode depressed, severe w psychosis ([place]) Diagnosis:   Patient Active Problem List   Diagnosis Date Noted  . Orthostatic hypotension [I95.1] 10/24/2017  . Fatty liver [K76.0] 10/24/2017  . Thrombocytopenia ([place]) [D69.6] 09/29/2017  . Visual hallucinations [R44.1]   . Auditory hallucination [R44.0]   . Acute encephalopathy [G93.40] 09/26/2017  . Lower urinary tract infectious disease [N39.0] 09/26/2017  . Bipolar I disorder, most recent episode depressed, severe w psychosis ([place]) [F31.5] 06/21/2016  . History of MI (myocardial infarction) [I25.2] 06/21/2016  . Spinal stenosis of lumbar region with neurogenic claudication [M48.062] 03/29/2016  . Severe obesity (BMI >= 40) ([place]) [E66.01] 07/05/2015  . Hyperlipidemia LDL goal <130 [E78.5] 07/05/2015  . MDD (major depressive disorder), single episode, severe with psychotic features ([place]) [F32.3] 01/21/2015  . 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 Bilateral knee pain [M25.561, M25.562] 01/07/2014  . Scar condition and fibrosis of skin [L90.5] 12/11/2013  . Essential hypertension, benign [I10] 07/01/2013  . Asthma with acute exacerbation [J45.901] 11/04/2012  . OSA on CPAP [G47.33, Z99.89] 11/04/2012  . Back pain, chronic [M54.9, G89.29] 08/27/2012  . Vitamin D insufficiency [E55.9] 04/04/2012  . Insomnia due to mental disorder [F51.05] 04/04/2012  . Anxiety [F41.9] 04/04/2012  . OCD (obsessive compulsive disorder) [F42.9] 04/04/2012  . PTSD (post-traumatic stress disorder) [F43.10] 06/28/2011  . Coronary artery disease [I25.10] 08/26/2010  . FATTY LIVER DISEASE [K76.89] 03/29/2009  . GERD [K21.9] 03/01/2009  . Morbid obesity ([place]) [E66.01] 02/15/2009  . Hypothyroidism [E03.9] 02/12/2009  . HYPERCHOLESTEROLEMIA  [E78.00] 02/12/2009    Total Time spent with patient: 45 minutes  Subjective:   Allison Sharp is a 62 y.o. female patient admitted with suicidal ideation with a plan to overdose.  HPI:  Pt was seen and chart reviewed with treatment team and Dr Darleene Cleaver. Pt denies homicidal ideation, but admits auditory/visual hallucinations and endorses suicidal ideation with a plan to overdose but she does not appear to be responding to internal stimuli. Pt is depressed, sad, and tearful during her assessment and stated she is tired of being a burden to there family and just wants to end her life. Pt stated she has a bottle of 90 narcotic pills at home and her plan is to take them and go to sleep and never wake up. Pt's UDS and BAL are negative. Pt's urinalysis and remaining lab work are unremarkable. Pt would benefit from an inpatient psychiatric admission for crisis stabilization and medication management. Pt has been offered a [facility] and will be transferred there today.   Past Psychiatric History: As above  Risk to Self: Suicidal Ideation: Yes-Currently Present Suicidal Intent: Yes-Currently Present Is patient at risk for suicide?: Yes Suicidal Plan?: Yes-Currently Present Specify Current Suicidal Plan: Overdose on medications  Access to Means: Yes Specify Access to Suicidal Means: Medications  What has been your use of drugs/alcohol within the last 12 months?: None How many times?: 2 Other Self Harm Risks: None Triggers for Past Attempts: Other (Comment)(Chronic pain) Intentional Self Injurious Behavior: None Risk to Others: Homicidal Ideation: No Thoughts of Harm to Others: No Current Homicidal Intent: No Current Homicidal Plan: No Access to Homicidal Means: No Identified Victim: NA History of harm to others?: No Assessment of Violence: None Noted Violent Behavior Description: NA Does patient have access to weapons?: No Criminal  Charges Pending?: No Does patient have a  court date: No Prior Inpatient Therapy: Prior Inpatient Therapy: Yes Prior Therapy Dates: 2018 Prior Therapy Facilty/Provider(s): [facility] Reason for Treatment: MH issues Prior Outpatient Therapy: Prior Outpatient Therapy: Yes Prior Therapy Dates: Current  Prior Therapy Facilty/Provider(s): [facility] MD Reason for Treatment: Med mang Does patient have an ACCT team?: No Does patient have Intensive In-House Services?  : No Does patient have [facility] services? : No Does patient have P4CC services?: No  Past Medical History:  Past Medical History:  Diagnosis Date  . Allergy   . Anxiety   . Arthritis   . Asthma   . Bipolar 1 disorder ([place])   . CAD (coronary artery disease)   . Cellulitis   . CHF (congestive heart failure) ([facility])    diastolic dysfunction  . Chronic back pain   . Chronic headaches   . Complication of anesthesia    States she typically gets sick s/p anesthesia  . Contusion of sacrum   . COPD (chronic obstructive pulmonary disease) ([place])   . Depression   . Dyspnea    PFT 03/05/09 FEV1 2.77(98%), FVC 3.25(86%), FEV1% 85, TLC 5.88(99%), DLCO 60% ,  Methacholine challenge 03/16/09 normal ,  CT chest 03/12/09 no pulmonary disease  . Fungal infection   . GERD (gastroesophageal reflux disease)   . History of colonoscopy 10/17/2002   by Dr Rehman-> distal non-specific proctitis, small ext hemorrhoids,   . HTN (hypertension)   . Hyperlipidemia   . Hyperthyroidism   . Hypothyroidism    States she only has hyperthyroidism  . Migraine headache   . Morbid obesity with body mass index of 50.0-59.9 in adult [facility]) JAN 2011 370 LBS   2004 311 BMI 45.9  . Myocardial infarction ([place])    NOV 1997  . OSA on CPAP    she had been on 2L O2 at night but that was stopped  . PONV (postoperative nausea and vomiting)   . PTSD (post-traumatic stress disorder)   . Suicidal ideation   . Urine incontinence   . Vitamin D deficiency     Past Surgical History:  Procedure Laterality Date  .  ABDOMINAL HYSTERECTOMY     sept 1996  . APPENDECTOMY    . BACK SURGERY  2008  . CARDIAC CATHETERIZATION     nov 1997  . CHOLECYSTECTOMY    . COLONOSCOPY  10/17/2002    Distal proctitis, small external hemorrhoids, otherwise/  normal colonoscopy. Suspect rectal bleeding secondary to hemorrhoids  . ESOPHAGOGASTRODUODENOSCOPY  03/18/09   fundic gland polyps/mild gastritis  . HERNIA REPAIR  1978  . JOINT REPLACEMENT     bil knee replacement  . KNEE ARTHROSCOPY    . MULTIPLE EXTRACTIONS WITH ALVEOLOPLASTY N/A 08/16/2015   Procedure: EXTRACTION OF TEETH THREE, SIX, EIGHT, NINE, ELEVEN, FOURTEEN, FIFTEEN, TWENTY-EIGHT WITH ALVEOLOPLASTY;  Surgeon: Diona Browner, DDS;  Location: [facility];  Service: Oral Surgery;  Laterality: N/A;  . TONSILLECTOMY    . TOTAL VAGINAL HYSTERECTOMY    . TUBAL LIGATION     Family History:  Family History  Problem Relation Age of Onset  . Hypertension Mother   . Bipolar disorder Mother   . Dementia Mother   . Depression Mother   . Heart attack Mother   . AAA (abdominal aortic aneurysm) Mother   . Coronary artery disease Father   . Alcohol abuse Father   . Hypertension Brother   . Coronary artery disease Brother   . Bipolar disorder Brother   .  Depression Brother   . Depression Sister   . Paranoid behavior Sister   . Cancer Sister        breast  . Bipolar disorder Sister   . Depression Sister   . Hypertension Sister   . Cancer Son        thyroid  . Cancer Maternal Aunt        breast metastatized to brain  . Anesthesia problems Neg Hx   . Hypotension Neg Hx   . Malignant hyperthermia Neg Hx   . Pseudochol deficiency Neg Hx    Family Psychiatric  History: Unknown Social History:  Social History   Substance and Sexual Activity  Alcohol Use No  . Alcohol/week: 0.0 standard drinks     Social History   Substance and Sexual Activity  Drug Use No    Social History   Socioeconomic History  . Marital status: Single    Spouse name: Not on file   . Number of children: 2  . Years of education: Not on file  . Highest education level: Not on file  Occupational History  . Occupation: Disabled    Fish farm manager: UNEMPLOYED    Comment: back problems  Social Needs  . Financial resource strain: Not on file  . Food insecurity:    Worry: Not on file    Inability: Not on file  . Transportation needs:    Medical: Not on file    Non-medical: Not on file  Tobacco Use  . Smoking status: Never Smoker  . Smokeless tobacco: Never Used  Substance and Sexual Activity  . Alcohol use: No    Alcohol/week: 0.0 standard drinks  . Drug use: No  . Sexual activity: Never    Birth control/protection: Abstinence  Lifestyle  . Physical activity:    Days per week: Not on file    Minutes per session: Not on file  . Stress: Not on file  Relationships  . Social connections:    Talks on phone: Not on file    Gets together: Not on file    Attends religious service: Not on file    Active member of club or organization: Not on file    Attends meetings of clubs or organizations: Not on file    Relationship status: Not on file  Other Topics Concern  . Not on file  Social History Narrative   [name] is disabled and lives with her sister, Kynedi. She has another sister with whom she has lived with in the past, and who is a Designer, jewellery, but not currently practicing.   She has two grown sons that she does not see often, as they live in other states. She has 5 grand children.   [name] has a long history of mental illness including depression, PTSD, suicidal and homicidal ideation.   She has been obese most all of her life. Her weight has significantly impacted her QOL. She recently lost 20 lbs. By decreasing portion size & increasing proteins.    Additional Social History:    Allergies:   Allergies  Allergen Reactions  . Abilify [Aripiprazole] Other (See Comments)    hallucinations  . Naproxen Nausea And Vomiting and Swelling  . Ativan  [Lorazepam] Other (See Comments)    Delirium  . Haldol [Haloperidol] Other (See Comments)    Hallucinating   . Hydroxyzine Other (See Comments)    hallucinations    Labs:  Results for orders placed or performed during the hospital encounter of 01/25/18 (from  the past 48 hour(s))  Urine rapid drug screen (hosp performed)     Status: None   Collection Time: 01/25/18 12:17 PM  Result Value Ref Range   Opiates NONE DETECTED NONE DETECTED   Cocaine NONE DETECTED NONE DETECTED   Benzodiazepines NONE DETECTED NONE DETECTED   Amphetamines NONE DETECTED NONE DETECTED   Tetrahydrocannabinol NONE DETECTED NONE DETECTED   Barbiturates NONE DETECTED NONE DETECTED    Comment: (NOTE) DRUG SCREEN FOR MEDICAL PURPOSES ONLY.  IF CONFIRMATION IS NEEDED FOR ANY PURPOSE, NOTIFY LAB WITHIN 5 DAYS. LOWEST DETECTABLE LIMITS FOR URINE DRUG SCREEN Drug Class                     Cutoff (ng/mL) Amphetamine and metabolites    1000 Barbiturate and metabolites    200 Benzodiazepine                 503 Tricyclics and metabolites     300 Opiates and metabolites        300 Cocaine and metabolites        300 THC                            50 Performed at [facility], [place] [address] [place], [place] [address]   Urinalysis, Routine w reflex microscopic     Status: Abnormal   Collection Time: 01/25/18 12:17 PM  Result Value Ref Range   Color, Urine YELLOW YELLOW   APPearance CLEAR CLEAR   Specific Gravity, Urine 1.017 1.005 - 1.030   pH 6.0 5.0 - 8.0   Glucose, UA NEGATIVE NEGATIVE mg/dL   Hgb urine dipstick NEGATIVE NEGATIVE   Bilirubin Urine NEGATIVE NEGATIVE   Ketones, ur NEGATIVE NEGATIVE mg/dL   Protein, ur NEGATIVE NEGATIVE mg/dL   Nitrite NEGATIVE NEGATIVE   Leukocytes, UA SMALL (A) NEGATIVE   RBC / HPF 0-5 0 - 5 RBC/hpf   WBC, UA 11-20 0 - 5 WBC/hpf   Bacteria, UA RARE (A) NONE SEEN   Squamous Epithelial / LPF 0-5 0 - 5   Mucus PRESENT     Comment: Performed at [facility], [place] [address], [place], [place] [address]  Acetaminophen level     Status: Abnormal   Collection Time: 01/25/18 12:59 PM  Result Value Ref Range   Acetaminophen (Tylenol), Serum <10 (L) 10 - 30 ug/mL    Comment: (NOTE) Therapeutic concentrations vary significantly. A range of 10-30 ug/mL  may be an effective concentration for many patients. However, some  are best treated at concentrations outside of this range. Acetaminophen concentrations >150 ug/mL at 4 hours after ingestion  and >50 ug/mL at 12 hours after ingestion are often associated with  toxic reactions. Performed at [facility], [place] [address] [place], [place] [address]   Salicylate level     Status: None   Collection Time: 01/25/18 12:59 PM  Result Value Ref Range   Salicylate Lvl <7.4 2.8 - 30.0 mg/dL    Comment: Performed at [facility], [place] [address] [place],  [address]  Basic metabolic panel     Status: Abnormal   Collection Time: 01/25/18  1:00 PM  Result Value Ref Range   Sodium 144 135 - 145 mmol/L   Potassium 3.8 3.5 - 5.1 mmol/L   Chloride 106 98 - 111 mmol/L   CO2 28 22 - 32 mmol/L   Glucose, Bld  103 (H) 70 - 99 mg/dL   BUN 13 8 - 23 mg/dL   Creatinine, Ser 0.88 0.44 - 1.00 mg/dL   Calcium 9.9 8.9 - 10.3 mg/dL   GFR calc non Af Amer >60 >60 mL/min   GFR calc Af Amer >60 >60 mL/min    Comment: (NOTE) The eGFR has been calculated using the CKD EPI equation. This calculation has not been validated in all clinical situations. eGFR's persistently <60 mL/min signify possible Chronic Kidney Disease.    Anion gap 10 5 - 15    Comment: Performed at [facility], [place] [address], [place], [place] [address]  CBC with Differential/Platelet     Status: Abnormal   Collection Time: 01/25/18  3:26 PM  Result Value Ref Range   WBC 7.8 4.0 - 10.5 K/uL   RBC 4.17 3.87 - 5.11 MIL/uL   Hemoglobin 13.1 12.0 - 15.0 g/dL   HCT  39.4 36.0 - 46.0 %   MCV 94.5 78.0 - 100.0 fL   MCH 31.4 26.0 - 34.0 pg   MCHC 33.2 30.0 - 36.0 g/dL   RDW 12.5 11.5 - 15.5 %   Platelets 95 (L) 150 - 400 K/uL    Comment: SPECIMEN CHECKED FOR CLOTS REPEATED TO VERIFY PLATELET COUNT CONFIRMED BY SMEAR    Neutrophils Relative % 58 %   Neutro Abs 4.6 1.7 - 7.7 K/uL   Lymphocytes Relative 29 %   Lymphs Abs 2.3 0.7 - 4.0 K/uL   Monocytes Relative 11 %   Monocytes Absolute 0.8 0.1 - 1.0 K/uL   Eosinophils Relative 2 %   Eosinophils Absolute 0.2 0.0 - 0.7 K/uL   Basophils Relative 0 %   Basophils Absolute 0.0 0.0 - 0.1 K/uL    Comment: Performed at [facility], [place] [address], [place], [place] [address]  Ethanol     Status: None   Collection Time: 01/25/18  6:00 PM  Result Value Ref Range   Alcohol, Ethyl (B) <10 <10 mg/dL    Comment: (NOTE) Lowest detectable limit for serum alcohol is 10 mg/dL. For medical purposes only. Performed at [facility], [place] [address], [place],  [address]     Current Facility-Administered Medications  Medication Dose Route Frequency Provider Last Rate Last Dose  . benztropine (COGENTIN) tablet 1 mg  1 mg Oral QHS Dorian Duval, MD      . divalproex (DEPAKOTE) DR tablet 1,500 mg  1,500 mg Oral QHS Nicholette Dolson, MD      . DULoxetine (CYMBALTA) DR capsule 60 mg  60 mg Oral Daily Saber Dickerman, MD      . levothyroxine (SYNTHROID, LEVOTHROID) tablet 125 mcg  125 mcg Oral QAC breakfast Brad Lieurance, MD      . risperiDONE (RISPERDAL) tablet 2 mg  2 mg Oral BID Corena Pilgrim, MD       Current Outpatient Medications  Medication Sig Dispense Refill  . albuterol (PROVENTIL) (2.5 MG/3ML) 0.083% nebulizer solution Take 3 mLs (2.5 mg total) by nebulization every 4 (four) hours as needed for wheezing or shortness of breath. 75 mL 0  . atorvastatin (LIPITOR) 20 MG tablet TAKE 1 TABLET DAILY (Patient taking differently: Take 20 mg by mouth daily. ) 90  tablet 0  . benztropine (COGENTIN) 1 MG tablet Take 1 tablet (1 mg total) by mouth at bedtime. 90 tablet 0  . BREO ELLIPTA 100-25 MCG/INH AEPB INHALE 1 PUFF INTO THE LUNGS DAILY (Patient taking differently: Inhale 1 puff  into the lungs daily as needed (sob and wheezing). ) 60 each 5  . celecoxib (CELEBREX) 200 MG capsule TAKE  (1)  CAPSULE  TWICE DAILY. (Patient taking differently: Take 200 mg by mouth 2 (two) times daily. ) 60 capsule 2  . cetirizine (ZYRTEC) 10 MG tablet TAKE 1 TABLET ONCE DAILY (Patient taking differently: Take 10 mg by mouth daily. ) 30 tablet 11  . cloNIDine (CATAPRES) 0.1 MG tablet TAKE ONE TABLET AT BEDTIME (Patient taking differently: Take 0.1 mg by mouth at bedtime. ) 30 tablet 5  . divalproex (DEPAKOTE) 500 MG DR tablet Take 3 tablets (1,500 mg total) by mouth at bedtime. 270 tablet 0  . DULoxetine (CYMBALTA) 60 MG capsule Take 1 capsule (60 mg total) by mouth daily. 90 capsule 0  . fluticasone (FLONASE) 50 MCG/ACT nasal spray Place 1 spray 2 (two) times daily as needed into both nostrils for allergies or rhinitis. 16 g 6  . levothyroxine (SYNTHROID, LEVOTHROID) 125 MCG tablet TAKE (1) TABLET DAILY BE- FORE BREAKFAST. (Patient taking differently: Take 125 mcg by mouth daily before breakfast. ) 30 tablet 11  . nystatin cream (MYCOSTATIN) APPLY TO THE AFFECTED AREA THREE TIMES DAILY AS NEEDED .Marland KitchenFOR TOPICAL USE ONLY... (Patient taking differently: Apply 1 application topically 3 (three) times daily as needed for dry skin. ) 30 g 0  . omeprazole (PRILOSEC) 20 MG capsule TAKE (1) CAPSULE DAILY (Patient taking differently: Take 20 mg by mouth daily. ) 30 capsule 4  . ondansetron (ZOFRAN-ODT) 4 MG disintegrating tablet DISSOLVE 1 TABLET IN MOUTH EVERY 8 HOURS AS NEEDED FOR NAUSEA AND VOMITING (Patient taking differently: Take 4 mg by mouth every 8 (eight) hours as needed for nausea or vomiting. ) 20 tablet 0  . polyethylene glycol powder (GLYCOLAX/MIRALAX) powder Take 17 g by mouth 2  (two) times daily as needed. (Patient taking differently: Take 17 g by mouth 2 (two) times daily as needed for moderate constipation. ) 3350 g 1  . PROAIR HFA 108 (90 Base) MCG/ACT inhaler USE 2 PUFF EVERY 4 HOURS AS NEEDED FOR WHEEZING OR SHORTNESS OF BREATH (Patient taking differently: Inhale 2 puffs into the lungs every 6 (six) hours as needed for wheezing or shortness of breath. ) 8.5 g 5  . promethazine (PHENERGAN) 25 MG tablet Take 1 tablet (25 mg total) by mouth every 8 (eight) hours as needed for nausea or vomiting. 20 tablet 0  . pseudoephedrine (SUDAFED) 30 MG tablet Take 30 mg by mouth every 4 (four) hours as needed for congestion (headache).    . risperidone (RISPERDAL) 4 MG tablet Take 1 tablet (4 mg total) by mouth at bedtime. 90 tablet 0  . therapeutic multivitamin-minerals (THERAGRAN-M) tablet Take 1 tablet by mouth daily.    . traMADol (ULTRAM) 50 MG tablet Take 1 tablet (50 mg total) by mouth every 8 (eight) hours as needed for moderate pain. (Patient taking differently: Take 50 mg by mouth every 8 (eight) hours. ) 90 tablet 2  . diclofenac sodium (VOLTAREN) 1 % GEL Apply to affected area twice daily as needed (Patient taking differently: Apply 2 g topically 2 (two) times daily as needed (PAIN). ) 100 g 5    Musculoskeletal: Strength & Muscle Tone: within normal limits Gait & Station: not assessed Patient leans: N/A  Psychiatric Specialty Exam: Physical Exam  Constitutional: She is oriented to person, place, and time. She appears well-developed and well-nourished.  HENT:  Head: Normocephalic.  Respiratory: Effort normal.  Musculoskeletal: Normal range of  motion.  Neurological: She is alert and oriented to person, place, and time.  Psychiatric: Her speech is normal and behavior is normal. Thought content normal. Her mood appears anxious. Cognition and memory are normal. She expresses impulsivity. She exhibits a depressed mood.    Review of Systems  Psychiatric/Behavioral:  Positive for depression and suicidal ideas. Negative for hallucinations, memory loss and substance abuse. The patient is nervous/anxious. The patient does not have insomnia.   All other systems reviewed and are negative.   Blood pressure 126/76, pulse 76, temperature 97.6 F (36.4 C), temperature source Oral, resp. rate 18, height '5\' 9"'  (1.753 m), weight (!) 175.1 kg, last menstrual period 02/18/1995, SpO2 98 %.Body mass index is 57 kg/m.  General Appearance: Casual  Eye Contact:  Good  Speech:  Clear and Coherent and Normal Rate  Volume:  Normal  Mood:  Anxious and Depressed  Affect:  Congruent and Depressed  Thought Process:  Coherent and Linear  Orientation:  Full (Time, Place, and Person)  Thought Content:  Rumination  Suicidal Thoughts:  Yes.  with intent/plan  Homicidal Thoughts:  No  Memory:  Immediate;   Good Recent;   Good Remote;   Fair  Judgement:  Impaired  Insight:  Shallow  Psychomotor Activity:  Normal  Concentration:  Concentration: Good and Attention Span: Good  Recall:  [place] of Knowledge:  Good  Language:  Good  Akathisia:  No  Handed:  Right  AIMS (if indicated):     Assets:  Agricultural consultant Housing Social Support  ADL's:  Intact  Cognition:  WNL  Sleep:        Treatment Plan Summary: Daily contact with patient to assess and evaluate symptoms and progress in treatment and Medication management (see MAR )  Disposition: Recommend psychiatric Inpatient admission when medically cleared. Pt has been accepted at [facility], NP 01/26/2018 12:41 PM  Patient seen face-to-face for psychiatric evaluation, chart reviewed and case discussed with the physician extender and developed treatment plan. Reviewed the information documented and agree with the treatment plan. Corena Pilgrim, MD

## 2018-01-26 NOTE — Progress Notes (Signed)
Patient ID: Allison Sharp, female   DOB: 22-Jun-1955, 62 y.o.   MRN: [id number]  Pt has a bed offer at [facility]. Pt has agreed to go but [facility] requires IVC prior to admission to prevent Pt from leaving once she arrives at [facility].  Staff RN informed by this clinician that EDP would need to initiate IVC as psychiatrist has gone for the day. Pt is suicidal with a plan to overdose on pain medications she has at home. Pt is deemed a danger to herself. Pt will need to be transported via law enforecement once she is under IVC.   Ethelene Hal, NP-C 01/26/2018         1515

## 2018-01-26 NOTE — Progress Notes (Signed)
CSW following up with patient behavioral health gero-psych referrals. CSW spoke with [facility] intake as a prior note indicates that "Brandy" at [facility] Called to inquire if patient still needs placement  (01/25/18).   [facility] Intake states they are not aware of patient. CSW re-faxed behavioral health referrals to the following facilities:  [facility]  [facility]  [facility]  [facility]     Stephanie Acre, [place] Social Worker [phone]

## 2018-01-27 DIAGNOSIS — R279 Unspecified lack of coordination: Secondary | ICD-10-CM | POA: Diagnosis not present

## 2018-01-27 DIAGNOSIS — Z743 Need for continuous supervision: Secondary | ICD-10-CM | POA: Diagnosis not present

## 2018-01-27 NOTE — Progress Notes (Signed)
CSW spoke with [facility] intake [phone]), intake confirms a bed is still available for the patient and will be held through midnight. Patient is IVC'd and will require sheriff's transport.  Stephanie Acre, [place] Social Worker [phone]

## 2018-01-27 NOTE — Progress Notes (Signed)
Called [facility] [phone]) and gave report to [place]  Patient picked up by [facility] and accompanied by sheriff to [facility]

## 2018-01-31 ENCOUNTER — Other Ambulatory Visit (HOSPITAL_COMMUNITY): Payer: Self-pay | Admitting: Psychiatry

## 2018-01-31 DIAGNOSIS — F3160 Bipolar disorder, current episode mixed, unspecified: Secondary | ICD-10-CM

## 2018-02-04 DIAGNOSIS — G4733 Obstructive sleep apnea (adult) (pediatric): Secondary | ICD-10-CM | POA: Diagnosis not present

## 2018-02-06 ENCOUNTER — Other Ambulatory Visit (HOSPITAL_COMMUNITY): Payer: Self-pay | Admitting: Psychiatry

## 2018-02-06 ENCOUNTER — Ambulatory Visit (INDEPENDENT_AMBULATORY_CARE_PROVIDER_SITE_OTHER): Payer: Medicare Other | Admitting: Psychiatry

## 2018-02-06 ENCOUNTER — Encounter (HOSPITAL_COMMUNITY): Payer: Self-pay | Admitting: Psychiatry

## 2018-02-06 VITALS — BP 147/92 | HR 85 | Ht 69.0 in | Wt 386.0 lb

## 2018-02-06 DIAGNOSIS — F411 Generalized anxiety disorder: Secondary | ICD-10-CM

## 2018-02-06 DIAGNOSIS — D696 Thrombocytopenia, unspecified: Secondary | ICD-10-CM

## 2018-02-06 DIAGNOSIS — D649 Anemia, unspecified: Secondary | ICD-10-CM

## 2018-02-06 DIAGNOSIS — Z79899 Other long term (current) drug therapy: Secondary | ICD-10-CM

## 2018-02-06 DIAGNOSIS — Z9989 Dependence on other enabling machines and devices: Secondary | ICD-10-CM | POA: Diagnosis not present

## 2018-02-06 DIAGNOSIS — G4733 Obstructive sleep apnea (adult) (pediatric): Secondary | ICD-10-CM

## 2018-02-06 DIAGNOSIS — F3160 Bipolar disorder, current episode mixed, unspecified: Secondary | ICD-10-CM | POA: Diagnosis not present

## 2018-02-06 MED ORDER — RISPERIDONE 2 MG PO TABS: 2.0000 mg | ORAL_TABLET | Freq: Every day | ORAL | 2 refills | Status: DC | PRN

## 2018-02-06 NOTE — Patient Instructions (Addendum)
  Please make an appointment with your primary care doctor to evaluate your blood counts.  Low Hemoglobin and low platelets. And discuss chronic bladder infection prophylaxis, due to increased risk for delirium with infections.    Labs today for Depakote level, CBC with diff and CMP.  Restart Depakote at 1,500 mg each night.   You have an as needed Risperdal 2 mg dose to be used as needed once a day for auditory hallucinations.   If you are needing this, please call to make an appointment with Dr. Adele Schilder.

## 2018-02-06 NOTE — Progress Notes (Signed)
BH MD/PA/NP OP Progress Note  02/06/2018 8:29 AM Allison Sharp  MRN:  [id number]  Chief Complaint: I was hospitalized for suicidal thoughts.   HPI: Allison Sharp is a 62 y.o. female who presents for follow-up of PTSD and Bipolar 1, MRE depressed with psychosis.  She was last seen for an urgent appointment on 01/25/2018 for AVH and suicidal thoughts with a plan to overdose.  She was evaluated in ED and TTS at [facility] and admitted to [facility] and discharged 9.7.2019.  At that time, " Pt denies homicidal ideation, but admits auditory/visual hallucinations and endorses suicidal ideation with a plan to overdose but she does not appear to be responding to internal stimuli. Pt is depressed, sad, and tearful during her assessment and stated she is tired of being a burden to there family and just wants to end her life. Pt stated she has a bottle of 90 narcotic pills at home and her plan is to take them and go to sleep and never wake up. Pt's UDS and BAL are negative. Pt's urinalysis and remaining lab work are unremarkable. Pt would benefit from an inpatient psychiatric admission for crisis stabilization and medication management. Pt has been offered a bed at [facility] and will be transferred there today. "  Today [name] is accompanied by her sister, [name].  She reports that during her hospitalization she was told her Depakote level was too high, and dose was decreased from 1500 mg to 1000 mg at night.  She states that since she decreased the dose she has been feeling more sluggish.  Patient usually lives with her niece who has had knee surgery in [place] and is in recovery.  Senior care has evaluated Kerry-Anne and she qualified for assisted care.  Family needs respite help from 9-noon, to do AM care, feed breakfast and make a snack for the afternoon on days Doylene works until patient's niece is home.  She is taking medications, but would like to go back to her usual dose of Depakote.   Sleep is good: Bedtime 9 PM and awake at 9:30-10 AM, and feels rested when awakens for the day.  She is reliably using CPAP. Starts feeling tired in the afternoon related to COPD.  She has been napping in the afternoon for 1 hour with CPAP and feels rested when she wakes back up. She denies any irritability, anger, mania, psychosis. She denies any specific phobias, or panic attacks. She denies any crying spells or any feeling of hopelessness or worthlessness. She denies mania or hypomania with racing thoughts, pressured speech, hyperactivity, grandiosity, distractibility, or changes in sleep or appetite. PTSD symptoms are stable.  She denies SI, HI, self harm thoughts or AVH. She is tolerating medication without any side effects.  She has no tremors, shakes or any EPS.  Patient denies using alcohol or any illegal substances. Her energy level is fair.  Her appetite is good.   She still have chronic health issues including obesity, frequent fall, chronic pain and she uses oxygen.  Vital signs are stable.   Medications from hospitalization are reviewed. Will request labs from [facility].    Visit Diagnosis:    ICD-10-CM   1. Mixed bipolar I disorder (HCC) F31.60 Valproic Acid level    CBC w/Diff/Platelet    COMPLETE METABOLIC PANEL WITH GFR  2. Generalized anxiety disorder F41.1 Valproic Acid level    CBC w/Diff/Platelet    COMPLETE METABOLIC PANEL WITH GFR  3. OSA  on CPAP G47.33 Valproic Acid level   Z99.89 CBC w/Diff/Platelet    COMPLETE METABOLIC PANEL WITH GFR  4. Thrombocytopenia (HCC) D69.6 Valproic Acid level    CBC w/Diff/Platelet    COMPLETE METABOLIC PANEL WITH GFR  5. Anemia, unspecified type D64.9 Valproic Acid level    CBC w/Diff/Platelet    COMPLETE METABOLIC PANEL WITH GFR  6. Encounter for long-term (current) use of high-risk medication Z79.899 Valproic Acid level    CBC w/Diff/Platelet    COMPLETE METABOLIC PANEL WITH GFR    Past Psychiatric History: Reviewed. Patient has  significant history of psychiatric illness with multiple psychiatric inpatient treatment.  Her last admissions:  August-September 2019 at [facility]; January 2018 at [facility].  She had history of suicidal attempt by taking overdose. She had diagnosed with PTSD, depression, bipolar disorder. In the past she had tried Paxil, Prozac, Wellbutrin, Effexor, Lexapro, amitriptyline, Cymbalta, Abilify, Neurontin, trazodone, Thorazine, hydroxyzine, Luvox, Sinequan, Zyprexa and Latuda. She has best responded on Risperdal and Cymbalta.   Her medicines were changed from Abilify to Risperdal because she was having tremors with Abilify.    Past Medical History:  Past Medical History:  Diagnosis Date  . Allergy   . Anxiety   . Arthritis   . Asthma   . Bipolar 1 disorder ([place])   . CAD (coronary artery disease)   . Cellulitis   . CHF (congestive heart failure) (HCC)    diastolic dysfunction  . Chronic back pain   . Chronic headaches   . Complication of anesthesia    States she typically gets sick s/p anesthesia  . Contusion of sacrum   . COPD (chronic obstructive pulmonary disease) ([place])   . Depression   . Dyspnea    PFT 03/05/09 FEV1 2.77(98%), FVC 3.25(86%), FEV1% 85, TLC 5.88(99%), DLCO 60% ,  Methacholine challenge 03/16/09 normal ,  CT chest 03/12/09 no pulmonary disease  . Fungal infection   . GERD (gastroesophageal reflux disease)   . History of colonoscopy 10/17/2002   by Dr Rehman-> distal non-specific proctitis, small ext hemorrhoids,   . HTN (hypertension)   . Hyperlipidemia   . Hyperthyroidism   . Hypothyroidism    States she only has hyperthyroidism  . Migraine headache   . Morbid obesity with body mass index of 50.0-59.9 in adult [facility]) JAN 2011 370 LBS   2004 311 BMI 45.9  . Myocardial infarction ([place])    NOV 1997  . OSA on CPAP    she had been on 2L O2 at night but that was stopped  . PONV (postoperative nausea and vomiting)   . PTSD (post-traumatic stress  disorder)   . Suicidal ideation   . Urine incontinence   . Vitamin D deficiency     Past Surgical History:  Procedure Laterality Date  . ABDOMINAL HYSTERECTOMY     sept 1996  . APPENDECTOMY    . BACK SURGERY  2008  . CARDIAC CATHETERIZATION     nov 1997  . CHOLECYSTECTOMY    . COLONOSCOPY  10/17/2002    Distal proctitis, small external hemorrhoids, otherwise/  normal colonoscopy. Suspect rectal bleeding secondary to hemorrhoids  . ESOPHAGOGASTRODUODENOSCOPY  03/18/09   fundic gland polyps/mild gastritis  . HERNIA REPAIR  1978  . JOINT REPLACEMENT     bil knee replacement  . KNEE ARTHROSCOPY    . MULTIPLE EXTRACTIONS WITH ALVEOLOPLASTY N/A 08/16/2015   Procedure: EXTRACTION OF TEETH THREE, SIX, EIGHT, NINE, ELEVEN, FOURTEEN, FIFTEEN, TWENTY-EIGHT WITH ALVEOLOPLASTY;  Surgeon:  Diona Browner, DDS;  Location: [place];  Service: Oral Surgery;  Laterality: N/A;  . TONSILLECTOMY    . TOTAL VAGINAL HYSTERECTOMY    . TUBAL LIGATION      Family Psychiatric History: Reviewed.  Family History:  Family History  Problem Relation Age of Onset  . Hypertension Mother   . Bipolar disorder Mother   . Dementia Mother   . Depression Mother   . Heart attack Mother   . AAA (abdominal aortic aneurysm) Mother   . Coronary artery disease Father   . Alcohol abuse Father   . Hypertension Brother   . Coronary artery disease Brother   . Bipolar disorder Brother   . Depression Brother   . Depression Sister   . Paranoid behavior Sister   . Cancer Sister        breast  . Bipolar disorder Sister   . Depression Sister   . Hypertension Sister   . Cancer Son        thyroid  . Cancer Maternal Aunt        breast metastatized to brain  . Anesthesia problems Neg Hx   . Hypotension Neg Hx   . Malignant hyperthermia Neg Hx   . Pseudochol deficiency Neg Hx     Social History:  Social History   Socioeconomic History  . Marital status: Single    Spouse name: Not on file  . Number of children: 2   . Years of education: Not on file  . Highest education level: Not on file  Occupational History  . Occupation: Disabled    Fish farm manager: UNEMPLOYED    Comment: back problems  Social Needs  . Financial resource strain: Not on file  . Food insecurity:    Worry: Not on file    Inability: Not on file  . Transportation needs:    Medical: Not on file    Non-medical: Not on file  Tobacco Use  . Smoking status: Never Smoker  . Smokeless tobacco: Never Used  Substance and Sexual Activity  . Alcohol use: No    Alcohol/week: 0.0 standard drinks  . Drug use: No  . Sexual activity: Never    Birth control/protection: Abstinence  Lifestyle  . Physical activity:    Days per week: Not on file    Minutes per session: Not on file  . Stress: Not on file  Relationships  . Social connections:    Talks on phone: Not on file    Gets together: Not on file    Attends religious service: Not on file    Active member of club or organization: Not on file    Attends meetings of clubs or organizations: Not on file    Relationship status: Not on file  Other Topics Concern  . Not on file  Social History Narrative   [name] is disabled and lives with her sister, Milana. She has another sister with whom she has lived with in the past, and who is a Designer, jewellery, but not currently practicing.   She has two grown sons that she does not see often, as they live in other states. She has 5 grand children.   [name] has a long history of mental illness including depression, PTSD, suicidal and homicidal ideation.   She has been obese most all of her life. Her weight has significantly impacted her QOL. She recently lost 20 lbs. By decreasing portion size & increasing proteins.     Allergies:  Allergies  Allergen  Reactions  . Abilify [Aripiprazole] Other (See Comments)    hallucinations  . Naproxen Nausea And Vomiting and Swelling  . Ativan [Lorazepam] Other (See Comments)    Delirium  . Haldol  [Haloperidol] Other (See Comments)    Hallucinating   . Hydroxyzine Other (See Comments)    hallucinations    Metabolic Disorder Labs: Lab Results  Component Value Date   HGBA1C 5.4 10/22/2017   MPG 108.28 10/22/2017   MPG 105 04/05/2017   No results found for: PROLACTIN Lab Results  Component Value Date   CHOL 236 (H) 07/02/2015   TRIG 181 (H) 07/02/2015   HDL 39 (L) 07/02/2015   CHOLHDL 6.1 (H) 07/02/2015   VLDL 30.0 12/10/2013   LDLCALC 161 (H) 07/02/2015   LDLCALC 89 12/10/2013   Lab Results  Component Value Date   TSH 5.634 (H) 09/27/2017   TSH 1.950 05/04/2017    Therapeutic Level Labs: No results found for: LITHIUM Lab Results  Component Value Date   VALPROATE 62 10/22/2017   VALPROATE 59 09/27/2017   No components found for:  CBMZ  Current Medications: Current Outpatient Medications  Medication Sig Dispense Refill  . albuterol (PROVENTIL) (2.5 MG/3ML) 0.083% nebulizer solution Take 3 mLs (2.5 mg total) by nebulization every 4 (four) hours as needed for wheezing or shortness of breath. 75 mL 0  . atorvastatin (LIPITOR) 20 MG tablet TAKE 1 TABLET DAILY (Patient taking differently: Take 20 mg by mouth daily. ) 90 tablet 0  . benztropine (COGENTIN) 1 MG tablet Take 1 tablet (1 mg total) by mouth at bedtime. 90 tablet 0  . BREO ELLIPTA 100-25 MCG/INH AEPB INHALE 1 PUFF INTO THE LUNGS DAILY (Patient taking differently: Inhale 1 puff into the lungs daily as needed (sob and wheezing). ) 60 each 5  . celecoxib (CELEBREX) 200 MG capsule TAKE  (1)  CAPSULE  TWICE DAILY. (Patient taking differently: Take 200 mg by mouth 2 (two) times daily. ) 60 capsule 2  . cetirizine (ZYRTEC) 10 MG tablet TAKE 1 TABLET ONCE DAILY (Patient taking differently: Take 10 mg by mouth daily. ) 30 tablet 11  . cloNIDine (CATAPRES) 0.1 MG tablet TAKE ONE TABLET AT BEDTIME (Patient taking differently: Take 0.1 mg by mouth at bedtime. ) 30 tablet 5  . diclofenac sodium (VOLTAREN) 1 % GEL Apply to  affected area twice daily as needed (Patient taking differently: Apply 2 g topically 2 (two) times daily as needed (PAIN). ) 100 g 5  . divalproex (DEPAKOTE) 500 MG DR tablet Take 3 tablets (1,500 mg total) by mouth at bedtime. 270 tablet 0  . DULoxetine (CYMBALTA) 60 MG capsule Take 1 capsule (60 mg total) by mouth daily. 90 capsule 0  . fluticasone (FLONASE) 50 MCG/ACT nasal spray Place 1 spray 2 (two) times daily as needed into both nostrils for allergies or rhinitis. 16 g 6  . levothyroxine (SYNTHROID, LEVOTHROID) 125 MCG tablet TAKE (1) TABLET DAILY BE- FORE BREAKFAST. (Patient taking differently: Take 125 mcg by mouth daily before breakfast. ) 30 tablet 11  . nystatin cream (MYCOSTATIN) APPLY TO THE AFFECTED AREA THREE TIMES DAILY AS NEEDED .Marland KitchenFOR TOPICAL USE ONLY... (Patient taking differently: Apply 1 application topically 3 (three) times daily as needed for dry skin. ) 30 g 0  . omeprazole (PRILOSEC) 20 MG capsule TAKE (1) CAPSULE DAILY (Patient taking differently: Take 20 mg by mouth daily. ) 30 capsule 4  . ondansetron (ZOFRAN-ODT) 4 MG disintegrating tablet DISSOLVE 1 TABLET IN MOUTH EVERY 8  HOURS AS NEEDED FOR NAUSEA AND VOMITING (Patient taking differently: Take 4 mg by mouth every 8 (eight) hours as needed for nausea or vomiting. ) 20 tablet 0  . polyethylene glycol powder (GLYCOLAX/MIRALAX) powder Take 17 g by mouth 2 (two) times daily as needed. (Patient taking differently: Take 17 g by mouth 2 (two) times daily as needed for moderate constipation. ) 3350 g 1  . PROAIR HFA 108 (90 Base) MCG/ACT inhaler USE 2 PUFF EVERY 4 HOURS AS NEEDED FOR WHEEZING OR SHORTNESS OF BREATH (Patient taking differently: Inhale 2 puffs into the lungs every 6 (six) hours as needed for wheezing or shortness of breath. ) 8.5 g 5  . promethazine (PHENERGAN) 25 MG tablet Take 1 tablet (25 mg total) by mouth every 8 (eight) hours as needed for nausea or vomiting. 20 tablet 0  . pseudoephedrine (SUDAFED) 30 MG tablet  Take 30 mg by mouth every 4 (four) hours as needed for congestion (headache).    . risperidone (RISPERDAL) 4 MG tablet Take 1 tablet (4 mg total) by mouth at bedtime. 90 tablet 0  . therapeutic multivitamin-minerals (THERAGRAN-M) tablet Take 1 tablet by mouth daily.    . traMADol (ULTRAM) 50 MG tablet Take 1 tablet (50 mg total) by mouth every 8 (eight) hours as needed for moderate pain. (Patient taking differently: Take 50 mg by mouth every 8 (eight) hours. ) 90 tablet 2   No current facility-administered medications for this visit.      Musculoskeletal: Strength & Muscle Tone: decreased Gait & Station: unsteady, Need walker Patient leans: Left and Front  Psychiatric Specialty Exam: Review of Systems  Constitutional: Negative.   Respiratory: Positive for shortness of breath (COPD).   Cardiovascular: Negative.   Gastrointestinal: Negative.   Musculoskeletal:       Uses walker  Neurological: Negative.   Psychiatric/Behavioral: Positive for depression (5/10, 10 the worst). Negative for hallucinations, memory loss, substance abuse and suicidal ideas. The patient is nervous/anxious (when she has difficulty breathing). The patient does not have insomnia.     Last menstrual period 02/18/1995.Body mass index is 57 kg/m.  BP (!) 147/92   Pulse 85   Ht 5\' 9"  (1.753 m)   Wt (!) 386 lb (175.1 kg)   LMP 02/18/1995   BMI 57.00 kg/m    General Appearance: Casual, Fairly Groomed and obese  Eye Contact:  Fair  Speech:  Clear and Coherent and Slow  Volume:  Normal  Mood:  Anxious  Affect:  Congruent  Thought Process:  Goal Directed  Orientation:  Full (Time, Place, and Person)  Thought Content: Logical and Hallucinations: None   Suicidal Thoughts:  No  Homicidal Thoughts:  No  Memory:  Immediate;   Fair Recent;   Fair Remote;   Fair  Judgement:  Good  Insight:  Present  Psychomotor Activity:  Decreased  Concentration:  Concentration: Fair and Attention Span: Fair  Recall:  [employer] of Knowledge: Fair  Language: Good  Akathisia:  No  Handed:  Right  AIMS (if indicated): not done  Assets:  Desire for Improvement Housing Resilience Social Support  ADL's:  Intact  Cognition: WNL  Sleep:  Fair   Screenings: AIMS     Admission (Discharged) from 01/17/2015 in [facility]  AIMS Total Score  0    AUDIT     Admission (Discharged) from 01/17/2015 in [place] [facility] Admission (Discharged) from 10/24/2012 in [place]  [facility]  Alcohol Use Disorder Identification Test Final Score (AUDIT)  0  0    Mini-Mental     Clinical Support from 11/08/2016 in [place] from 06/28/2015 in [place]  Total Score (max 30 points )  30  29    PHQ2-9     Office Visit from 10/08/2017 in [place] Visit from 07/20/2017 in [place] Visit from 07/02/2017 in [place] Office Visit from 05/04/2017 in [place] Visit from 04/16/2017 in [place]  PHQ-2 Total Score  0  0  0  0  1      71mo ago (10/22/17) 30mo ago (09/27/17) 61mo ago (09/27/17)    Valproic Acid Lvl 50.0 - 100.0 ug/mL 62  59 CM 82 CM     Assessment and Plan: Bipolar disorder type I.  Anxiety disorder NOS. OSA. Anemia, thrombocytopenia.  ANELIS HRIVNAK is a 62 y.o. female presents for f/u after inpatient psychiatric hospitalization. She had a medication change in Depakote, but felt better at higher dose.  We have followed labs which have never been indicative of high level.  Will repeat level today along with CBC w/ diff and CMP. She has not had follow-up with PCP regarding anemia and thrombocytopenia, and her sister ensures that they will make an appointment,.    Patient Instructions   Please make an appointment with your primary  care doctor to evaluate your blood counts.  Low Hemoglobin and low platelets. And discuss chronic bladder infection prophylaxis.   Labs today for Depakote level, CBC with diff and CMP.  Restart Depakote at 1,500 mg each night.   You have an as needed Risperdal 2 mg dose to be used as needed once a day for auditory hallucinations.   If you are needing this, please call to make an appointment with Dr. Adele Schilder.    Continue Risperdal 4 mg at bedtime, Cogentin 1 mg at bedtime, Depakote 1500 mg at bedtime and Cymbalta 60 mg daily.  I review her blood work results including Depakote level. Recommended to call us back if she has any question, concern or if you feel worsening of the symptoms.   Time spent 25 minutes.  More than 50% of the time spent in medication education, psychoeducation, counseling and coordination of care.  Discuss safety plan that anytime having active suicidal thoughts or homicidal thoughts then patient need to call 911 or go to the local emergency room.  RTC in 1 month   Lavella Hammock, MD 02/06/2018, 8:29 AM

## 2018-02-07 DIAGNOSIS — G4733 Obstructive sleep apnea (adult) (pediatric): Secondary | ICD-10-CM | POA: Diagnosis not present

## 2018-02-07 LAB — CBC WITH DIFFERENTIAL/PLATELET
BASOS ABS: 0 10*3/uL (ref 0.0–0.2)
Basos: 0 %
EOS (ABSOLUTE): 0.1 10*3/uL (ref 0.0–0.4)
Eos: 2 %
HEMOGLOBIN: 12.1 g/dL (ref 11.1–15.9)
Hematocrit: 35.8 % (ref 34.0–46.6)
IMMATURE GRANS (ABS): 0 10*3/uL (ref 0.0–0.1)
Immature Granulocytes: 0 %
LYMPHS ABS: 1.8 10*3/uL (ref 0.7–3.1)
LYMPHS: 26 %
MCH: 31.7 pg (ref 26.6–33.0)
MCHC: 33.8 g/dL (ref 31.5–35.7)
MCV: 94 fL (ref 79–97)
MONOCYTES: 9 %
Monocytes Absolute: 0.6 10*3/uL (ref 0.1–0.9)
NEUTROS ABS: 4.4 10*3/uL (ref 1.4–7.0)
Neutrophils: 63 %
PLATELETS: 126 10*3/uL — AB (ref 150–450)
RBC: 3.82 x10E6/uL (ref 3.77–5.28)
RDW: 13 % (ref 12.3–15.4)
WBC: 7 10*3/uL (ref 3.4–10.8)

## 2018-02-07 LAB — COMPREHENSIVE METABOLIC PANEL
ALBUMIN: 3.8 g/dL (ref 3.6–4.8)
ALK PHOS: 59 IU/L (ref 39–117)
ALT: 9 IU/L (ref 0–32)
AST: 17 IU/L (ref 0–40)
Albumin/Globulin Ratio: 1.6 (ref 1.2–2.2)
BILIRUBIN TOTAL: 0.4 mg/dL (ref 0.0–1.2)
BUN/Creatinine Ratio: 16 (ref 12–28)
BUN: 16 mg/dL (ref 8–27)
CO2: 29 mmol/L (ref 20–29)
CREATININE: 0.97 mg/dL (ref 0.57–1.00)
Calcium: 9.5 mg/dL (ref 8.7–10.3)
Chloride: 104 mmol/L (ref 96–106)
GFR calc Af Amer: 72 mL/min/{1.73_m2} (ref >59)
GFR, EST NON AFRICAN AMERICAN: 62 mL/min/{1.73_m2} (ref >59)
GLUCOSE: 111 mg/dL — AB (ref 65–99)
Globulin, Total: 2.4 g/dL (ref 1.5–4.5)
POTASSIUM: 4.1 mmol/L (ref 3.5–5.2)
Sodium: 146 mmol/L — ABNORMAL HIGH (ref 134–144)
Total Protein: 6.2 g/dL (ref 6.0–8.5)

## 2018-02-07 LAB — VALPROIC ACID LEVEL: Valproic Acid Lvl: 93 ug/mL (ref 50–100)

## 2018-02-09 DIAGNOSIS — G4733 Obstructive sleep apnea (adult) (pediatric): Secondary | ICD-10-CM | POA: Diagnosis not present

## 2018-02-13 ENCOUNTER — Other Ambulatory Visit (HOSPITAL_COMMUNITY): Payer: Self-pay | Admitting: Psychiatry

## 2018-02-13 DIAGNOSIS — F3160 Bipolar disorder, current episode mixed, unspecified: Secondary | ICD-10-CM

## 2018-02-14 ENCOUNTER — Telehealth: Payer: Self-pay | Admitting: Family Medicine

## 2018-02-15 ENCOUNTER — Ambulatory Visit: Payer: Medicare Other | Admitting: Family Medicine

## 2018-02-15 NOTE — Telephone Encounter (Signed)
appt scheduled Pt notified 

## 2018-02-18 ENCOUNTER — Ambulatory Visit: Payer: Medicare Other | Admitting: Family Medicine

## 2018-02-18 ENCOUNTER — Other Ambulatory Visit (HOSPITAL_COMMUNITY): Payer: Self-pay

## 2018-02-18 DIAGNOSIS — F3160 Bipolar disorder, current episode mixed, unspecified: Secondary | ICD-10-CM

## 2018-02-18 MED ORDER — DIVALPROEX SODIUM 500 MG PO DR TAB: 1500.0000 mg | DELAYED_RELEASE_TABLET | Freq: Every day | ORAL | 0 refills | Status: DC

## 2018-02-18 MED ORDER — RISPERIDONE 4 MG PO TABS: 4.0000 mg | ORAL_TABLET | Freq: Every day | ORAL | 0 refills | Status: DC

## 2018-02-18 MED ORDER — BENZTROPINE MESYLATE 1 MG PO TABS: 1.0000 mg | ORAL_TABLET | Freq: Every day | ORAL | 0 refills | Status: DC

## 2018-02-21 ENCOUNTER — Ambulatory Visit (INDEPENDENT_AMBULATORY_CARE_PROVIDER_SITE_OTHER): Payer: Medicare Other | Admitting: Family Medicine

## 2018-02-21 ENCOUNTER — Encounter: Payer: Self-pay | Admitting: Family Medicine

## 2018-02-21 VITALS — BP 133/85 | HR 78 | Temp 97.3°F | Ht 69.0 in | Wt 383.2 lb

## 2018-02-21 DIAGNOSIS — R441 Visual hallucinations: Secondary | ICD-10-CM

## 2018-02-21 DIAGNOSIS — W19XXXA Unspecified fall, initial encounter: Secondary | ICD-10-CM | POA: Diagnosis not present

## 2018-02-21 DIAGNOSIS — I951 Orthostatic hypotension: Secondary | ICD-10-CM | POA: Diagnosis not present

## 2018-02-21 DIAGNOSIS — R350 Frequency of micturition: Secondary | ICD-10-CM

## 2018-02-21 LAB — URINALYSIS, COMPLETE
Bilirubin, UA: NEGATIVE
Glucose, UA: NEGATIVE
Ketones, UA: NEGATIVE
Leukocytes, UA: NEGATIVE
NITRITE UA: NEGATIVE
RBC, UA: NEGATIVE
Specific Gravity, UA: >1.03 — ABNORMAL HIGH (ref 1.005–1.030)
UUROB: 2 mg/dL — AB (ref 0.2–1.0)
pH, UA: 6 (ref 5.0–7.5)

## 2018-02-21 LAB — MICROSCOPIC EXAMINATION

## 2018-02-21 NOTE — Progress Notes (Signed)
BP 133/85   Pulse 78   Temp (!) 97.3 F (36.3 C) (Oral)   Ht 5\' 9"  (1.753 m)   Wt (!) 383 lb 3.2 oz (173.8 kg)   LMP 02/18/1995   SpO2 97%   BMI 56.59 kg/m    Subjective:    Patient ID: Allison Sharp, female    DOB: Sep 03, 1955, 62 y.o.   MRN: [id number]  HPI: Allison Sharp is a 62 y.o. female presenting on 02/21/2018 for Hospitalization Follow-up (depression 8/30 AP- Patient states she has not been hearing or seeing things since coming from the hospital.); Fatigue; and dark urine (x 1 1/2 weeks)   HPI Hospital follow-up for visual hallucinations Patient was in [facility] on 01/25/2018 for visual hallucinations and was there for 2 days and saw psychiatry and went to behavioral health with psychiatry and they adjusted her medications and are following up with that on their end.  She says she has not had any hallucinations since she left the hospital.  She denies any suicidal ideations or thoughts of hurting herself.  Patient has been having darkened urine and fatigue and urinary frequency that is been going on for the past week and a half and says that she is urinating frequently but not large volumes and that has been a lot darker.  They also say she has some urgency where she has to go quickly to the restroom otherwise she is going to have an accident.  She denies any fevers or chills or abdominal pain or flank pain.  Relevant past medical, surgical, family and social history reviewed and updated as indicated. Interim medical history since our last visit reviewed. Allergies and medications reviewed and updated.  Review of Systems  Constitutional: Negative for chills and fever.  Eyes: Negative for visual disturbance.  Respiratory: Negative for chest tightness and shortness of breath.   Cardiovascular: Negative for chest pain and leg swelling.  Genitourinary: Positive for decreased urine volume, dysuria, frequency and urgency. Negative for difficulty urinating, vaginal  bleeding and vaginal discharge.  Musculoskeletal: Negative for back pain and gait problem.  Skin: Negative for rash.  Neurological: Negative for light-headedness and headaches.  Psychiatric/Behavioral: Negative for agitation and behavioral problems.  All other systems reviewed and are negative.   Per HPI unless specifically indicated above   Allergies as of 02/21/2018      Reactions   Abilify [aripiprazole] Other (See Comments)   hallucinations   Naproxen Nausea And Vomiting, Swelling   Ativan [lorazepam] Other (See Comments)   Delirium   Haldol [haloperidol] Other (See Comments)   Hallucinating    Hydroxyzine Other (See Comments)   hallucinations      Medication List        Accurate as of 02/21/18  1:28 PM. Always use your most recent med list.          albuterol (2.5 MG/3ML) 0.083% nebulizer solution Commonly known as:  PROVENTIL Take 3 mLs (2.5 mg total) by nebulization every 4 (four) hours as needed for wheezing or shortness of breath.   PROAIR HFA 108 (90 Base) MCG/ACT inhaler Generic drug:  albuterol USE 2 PUFF EVERY 4 HOURS AS NEEDED FOR WHEEZING OR SHORTNESS OF BREATH   atorvastatin 20 MG tablet Commonly known as:  LIPITOR TAKE 1 TABLET DAILY   benztropine 1 MG tablet Commonly known as:  COGENTIN Take 1 tablet (1 mg total) by mouth at bedtime.   BREO ELLIPTA 100-25 MCG/INH Aepb Generic drug:  fluticasone furoate-vilanterol INHALE  1 PUFF INTO THE LUNGS DAILY   celecoxib 200 MG capsule Commonly known as:  CELEBREX TAKE  (1)  CAPSULE  TWICE DAILY.   cetirizine 10 MG tablet Commonly known as:  ZYRTEC TAKE 1 TABLET ONCE DAILY   cloNIDine 0.1 MG tablet Commonly known as:  CATAPRES TAKE ONE TABLET AT BEDTIME   diclofenac sodium 1 % Gel Commonly known as:  VOLTAREN Apply to affected area twice daily as needed   divalproex 500 MG DR tablet Commonly known as:  DEPAKOTE Take 3 tablets (1,500 mg total) by mouth at bedtime.   DULoxetine 60 MG  capsule Commonly known as:  CYMBALTA Take 1 capsule (60 mg total) by mouth daily.   fluticasone 50 MCG/ACT nasal spray Commonly known as:  FLONASE Place 1 spray 2 (two) times daily as needed into both nostrils for allergies or rhinitis.   levothyroxine 125 MCG tablet Commonly known as:  SYNTHROID, LEVOTHROID TAKE (1) TABLET DAILY BE- FORE BREAKFAST.   nystatin cream Commonly known as:  MYCOSTATIN APPLY TO THE AFFECTED AREA THREE TIMES DAILY AS NEEDED .Marland KitchenFOR TOPICAL USE ONLY...   omeprazole 20 MG capsule Commonly known as:  PRILOSEC TAKE (1) CAPSULE DAILY   ondansetron 4 MG disintegrating tablet Commonly known as:  ZOFRAN-ODT DISSOLVE 1 TABLET IN MOUTH EVERY 8 HOURS AS NEEDED FOR NAUSEA AND VOMITING   polyethylene glycol powder powder Commonly known as:  GLYCOLAX/MIRALAX Take 17 g by mouth 2 (two) times daily as needed.   promethazine 25 MG tablet Commonly known as:  PHENERGAN Take 1 tablet (25 mg total) by mouth every 8 (eight) hours as needed for nausea or vomiting.   pseudoephedrine 30 MG tablet Commonly known as:  SUDAFED Take 30 mg by mouth every 4 (four) hours as needed for congestion (headache).   risperiDONE 2 MG tablet Commonly known as:  RISPERDAL Take 1 tablet (2 mg total) by mouth daily as needed. For auditory hallucintations   risperidone 4 MG tablet Commonly known as:  RISPERDAL Take 1 tablet (4 mg total) by mouth at bedtime.   therapeutic multivitamin-minerals tablet Take 1 tablet by mouth daily.   traMADol 50 MG tablet Commonly known as:  ULTRAM Take 1 tablet (50 mg total) by mouth every 8 (eight) hours as needed for moderate pain.          Objective:    BP 133/85   Pulse 78   Temp (!) 97.3 F (36.3 C) (Oral)   Ht 5\' 9"  (1.753 m)   Wt (!) 383 lb 3.2 oz (173.8 kg)   LMP 02/18/1995   SpO2 97%   BMI 56.59 kg/m   Wt Readings from Last 3 Encounters:  02/21/18 (!) 383 lb 3.2 oz (173.8 kg)  01/25/18 (!) 386 lb (175.1 kg)  01/23/18 (!) 386  lb (175.1 kg)    Physical Exam  Constitutional: She is oriented to person, place, and time. She appears well-developed and well-nourished. No distress.  Eyes: Conjunctivae are normal.  Cardiovascular: Normal rate, regular rhythm, normal heart sounds and intact distal pulses.  No murmur heard. Pulmonary/Chest: Effort normal and breath sounds normal. No respiratory distress. She has no wheezes.  Abdominal: Soft. Bowel sounds are normal. She exhibits no distension. There is no tenderness.  Neurological: She is alert and oriented to person, place, and time. Coordination normal.  Skin: Skin is warm and dry. No rash noted. She is not diaphoretic.  Psychiatric: She has a normal mood and affect. Her behavior is normal.  Nursing note and  vitals reviewed.       Assessment & Plan:   Problem List Items Addressed This Visit      Other   Visual hallucinations - Primary    Other Visit Diagnoses    Urinary frequency       Relevant Orders   Urinalysis, Complete   Urine Culture      Visual hallucinations resolved, she sees psych for this.  Will test urine for urine culture for urinary color changes. Follow up plan: Return if symptoms worsen or fail to improve.  Counseling provided for all of the vaccine components Orders Placed This Encounter  Procedures  . Urine Culture  . Urinalysis, Complete    [name], MD [facility] 02/21/2018, 1:28 PM

## 2018-02-21 NOTE — Addendum Note (Signed)
Addended by: [name] on: 02/21/2018 01:48 PM   Modules accepted: Orders

## 2018-02-22 ENCOUNTER — Ambulatory Visit: Payer: Medicare Other | Admitting: Family Medicine

## 2018-02-22 LAB — CBC WITH DIFFERENTIAL/PLATELET
Basophils Absolute: 0 10*3/uL (ref 0.0–0.2)
Basos: 0 %
EOS (ABSOLUTE): 0.2 10*3/uL (ref 0.0–0.4)
Eos: 2 %
HEMATOCRIT: 36.1 % (ref 34.0–46.6)
HEMOGLOBIN: 11.9 g/dL (ref 11.1–15.9)
IMMATURE GRANS (ABS): 0 10*3/uL (ref 0.0–0.1)
IMMATURE GRANULOCYTES: 0 %
LYMPHS: 31 %
Lymphocytes Absolute: 2.3 10*3/uL (ref 0.7–3.1)
MCH: 30.7 pg (ref 26.6–33.0)
MCHC: 33 g/dL (ref 31.5–35.7)
MCV: 93 fL (ref 79–97)
MONOCYTES: 11 %
MONOS ABS: 0.8 10*3/uL (ref 0.1–0.9)
NEUTROS PCT: 56 %
Neutrophils Absolute: 4 10*3/uL (ref 1.4–7.0)
Platelets: 126 10*3/uL — ABNORMAL LOW (ref 150–450)
RBC: 3.88 x10E6/uL (ref 3.77–5.28)
RDW: 14.4 % (ref 12.3–15.4)
WBC: 7.2 10*3/uL (ref 3.4–10.8)

## 2018-02-22 LAB — BMP8+EGFR
BUN/Creatinine Ratio: 16 (ref 12–28)
BUN: 13 mg/dL (ref 8–27)
CO2: 24 mmol/L (ref 20–29)
Calcium: 9.3 mg/dL (ref 8.7–10.3)
Chloride: 106 mmol/L (ref 96–106)
Creatinine, Ser: 0.81 mg/dL (ref 0.57–1.00)
GFR calc Af Amer: 89 mL/min/{1.73_m2} (ref >59)
GFR calc non Af Amer: 77 mL/min/{1.73_m2} (ref >59)
GLUCOSE: 91 mg/dL (ref 65–99)
POTASSIUM: 4.2 mmol/L (ref 3.5–5.2)
SODIUM: 145 mmol/L — AB (ref 134–144)

## 2018-02-22 LAB — URINE CULTURE

## 2018-02-25 ENCOUNTER — Encounter: Payer: Medicare Other | Admitting: *Deleted

## 2018-03-06 DIAGNOSIS — G4733 Obstructive sleep apnea (adult) (pediatric): Secondary | ICD-10-CM | POA: Diagnosis not present

## 2018-03-08 ENCOUNTER — Ambulatory Visit: Payer: Medicare Other | Admitting: Family Medicine

## 2018-03-09 DIAGNOSIS — G4733 Obstructive sleep apnea (adult) (pediatric): Secondary | ICD-10-CM | POA: Diagnosis not present

## 2018-03-11 DIAGNOSIS — G4733 Obstructive sleep apnea (adult) (pediatric): Secondary | ICD-10-CM | POA: Diagnosis not present

## 2018-03-12 ENCOUNTER — Ambulatory Visit (HOSPITAL_COMMUNITY): Payer: Medicare Other | Admitting: Psychiatry

## 2018-03-13 ENCOUNTER — Other Ambulatory Visit: Payer: Self-pay | Admitting: Family Medicine

## 2018-03-23 DIAGNOSIS — W19XXXA Unspecified fall, initial encounter: Secondary | ICD-10-CM | POA: Diagnosis not present

## 2018-03-23 DIAGNOSIS — I951 Orthostatic hypotension: Secondary | ICD-10-CM | POA: Diagnosis not present

## 2018-03-27 ENCOUNTER — Other Ambulatory Visit: Payer: Self-pay | Admitting: Family Medicine

## 2018-04-01 ENCOUNTER — Telehealth: Payer: Self-pay

## 2018-04-01 NOTE — Telephone Encounter (Signed)
Niece states that patient was having more SOB with oxygen on and also showing sign's of dementia.  States that she increased her oxygen to 3 and states that since then she has been fine. Wanted to give Dr Dettinger the [facility] and to call them if it was not the right thing to do.  Also states that she will make her sleep study but it has not been a year yet so she is waiting.

## 2018-04-01 NOTE — Telephone Encounter (Signed)
Aware we would only call if it was not okay- was just giving a FYI.

## 2018-04-01 NOTE — Telephone Encounter (Signed)
Yes that is fine to increase the oxygen for now

## 2018-04-03 ENCOUNTER — Ambulatory Visit (HOSPITAL_COMMUNITY): Payer: Medicare Other | Admitting: Psychiatry

## 2018-04-06 DIAGNOSIS — G4733 Obstructive sleep apnea (adult) (pediatric): Secondary | ICD-10-CM | POA: Diagnosis not present

## 2018-04-09 DIAGNOSIS — G4733 Obstructive sleep apnea (adult) (pediatric): Secondary | ICD-10-CM | POA: Diagnosis not present

## 2018-04-11 DIAGNOSIS — G4733 Obstructive sleep apnea (adult) (pediatric): Secondary | ICD-10-CM | POA: Diagnosis not present

## 2018-04-12 DIAGNOSIS — R531 Weakness: Secondary | ICD-10-CM | POA: Diagnosis not present

## 2018-04-12 DIAGNOSIS — R5381 Other malaise: Secondary | ICD-10-CM | POA: Diagnosis not present

## 2018-04-12 DIAGNOSIS — Z9181 History of falling: Secondary | ICD-10-CM | POA: Diagnosis not present

## 2018-04-16 ENCOUNTER — Other Ambulatory Visit: Payer: Self-pay | Admitting: Family Medicine

## 2018-04-17 ENCOUNTER — Other Ambulatory Visit: Payer: Self-pay | Admitting: Family Medicine

## 2018-04-23 DIAGNOSIS — I951 Orthostatic hypotension: Secondary | ICD-10-CM | POA: Diagnosis not present

## 2018-04-23 DIAGNOSIS — W19XXXA Unspecified fall, initial encounter: Secondary | ICD-10-CM | POA: Diagnosis not present

## 2018-05-02 ENCOUNTER — Ambulatory Visit: Payer: Medicare Other | Admitting: Family Medicine

## 2018-05-02 ENCOUNTER — Other Ambulatory Visit: Payer: Self-pay

## 2018-05-02 MED ORDER — CELECOXIB 200 MG PO CAPS: ORAL_CAPSULE | ORAL | 1 refills | Status: DC

## 2018-05-02 MED ORDER — ATORVASTATIN CALCIUM 20 MG PO TABS: 20.0000 mg | ORAL_TABLET | Freq: Every day | ORAL | 0 refills | Status: DC

## 2018-05-03 ENCOUNTER — Other Ambulatory Visit: Payer: Self-pay | Admitting: Family Medicine

## 2018-05-03 ENCOUNTER — Ambulatory Visit (INDEPENDENT_AMBULATORY_CARE_PROVIDER_SITE_OTHER): Payer: Medicare Other | Admitting: Physician Assistant

## 2018-05-03 ENCOUNTER — Encounter: Payer: Self-pay | Admitting: Physician Assistant

## 2018-05-03 VITALS — BP 122/74 | HR 80 | Temp 97.2°F | Ht 69.0 in | Wt 384.6 lb

## 2018-05-03 DIAGNOSIS — J0110 Acute frontal sinusitis, unspecified: Secondary | ICD-10-CM | POA: Diagnosis not present

## 2018-05-03 DIAGNOSIS — J449 Chronic obstructive pulmonary disease, unspecified: Secondary | ICD-10-CM

## 2018-05-03 MED ORDER — PREDNISONE 10 MG (21) PO TBPK: ORAL_TABLET | ORAL | 0 refills | Status: DC

## 2018-05-03 MED ORDER — ALBUTEROL SULFATE HFA 108 (90 BASE) MCG/ACT IN AERS: INHALATION_SPRAY | RESPIRATORY_TRACT | 5 refills | Status: DC

## 2018-05-03 MED ORDER — ALBUTEROL SULFATE (2.5 MG/3ML) 0.083% IN NEBU: 2.5000 mg | INHALATION_SOLUTION | RESPIRATORY_TRACT | 0 refills | Status: DC | PRN

## 2018-05-03 MED ORDER — AMOXICILLIN-POT CLAVULANATE 875-125 MG PO TABS: 1.0000 | ORAL_TABLET | Freq: Two times a day (BID) | ORAL | 0 refills | Status: DC

## 2018-05-03 NOTE — Telephone Encounter (Signed)
Advised pt she would need face to face appt for the new oxygen rx especially if the amount of oxygen needs to be changed. Pt scheduled with Dr Warrick Parisian 06/12/17 at 12:55. She would like rx for CPAP supplies and mask sent to [place]. Rx signed and faxed to [facility].

## 2018-05-03 NOTE — Telephone Encounter (Signed)
rtn call °

## 2018-05-05 NOTE — Progress Notes (Addendum)
BP 122/74   Pulse 80   Temp (!) 97.2 F (36.2 C) (Oral)   Ht 5\' 9"  (1.753 m)   Wt (!) 384 lb 9.6 oz (174.5 kg)   LMP 02/18/1995   SpO2 96% Comment: 3 l of oxygen  BMI 56.80 kg/m    Subjective:    Patient ID: Allison Sharp, female    DOB: [birth year], 62 y.o.   MRN: [id number]  HPI: Allison Sharp is a 62 y.o. female presenting on 05/03/2018 for Cough and Sinusitis This patient has had many days of sinus headache and postnasal drainage. There is copious drainage at times. Denies any fever at this time. There has been a history of sinus infections in the past.  No history of sinus surgery. There is cough at night. It has become more prevalent in recent days.  Patient also with COPD and oxygen required.  [facility] her [facility].  Her niece who is her caregiver reports that she has been using 2 L to 3 L of oxygen most of the time.  They are working on getting a Conservation officer, nature.  She is running out when they are out and about with the tank.  Past Medical History:  Diagnosis Date  . Allergy   . Anxiety   . Arthritis   . Asthma   . Bipolar 1 disorder ([place])   . CAD (coronary artery disease)   . Cellulitis   . CHF (congestive heart failure) (H[facility])    diastolic dysfunction  . Chronic back pain   . Chronic headaches   . Complication of anesthesia    States she typically gets sick s/p anesthesia  . Contusion of sacrum   . COPD (chronic obstructive pulmonary disease) ([place])   . Depression   . Dyspnea    PFT 03/05/09 FEV1 2.77(98%), FVC 3.25(86%), FEV1% 85, TLC 5.88(99%), DLCO 60% ,  Methacholine challenge 03/16/09 normal ,  CT chest 03/12/09 no pulmonary disease  . Fungal infection   . GERD (gastroesophageal reflux disease)   . History of colonoscopy 10/17/2002   by Dr Rehman-> distal non-specific proctitis, small ext hemorrhoids,   . HTN (hypertension)   . Hyperlipidemia   . Hyperthyroidism   . Hypothyroidism    States she only has hyperthyroidism  .  Migraine headache   . Morbid obesity with body mass index of 50.0-59.9 in adult [facility]) JAN 2011 370 LBS   2004 311 BMI 45.9  . Myocardial infarction ([place])    NOV 1997  . OSA on CPAP    she had been on 2L O2 at night but that was stopped  . PONV (postoperative nausea and vomiting)   . PTSD (post-traumatic stress disorder)   . Suicidal ideation   . Urine incontinence   . Vitamin D deficiency    Relevant past medical, surgical, family and social history reviewed and updated as indicated. Interim medical history since our last visit reviewed. Allergies and medications reviewed and updated. DATA REVIEWED: CHART IN EPIC  Family History reviewed for pertinent findings.  Review of Systems  Constitutional: Positive for chills and fatigue. Negative for activity change and appetite change.  HENT: Positive for congestion, postnasal drip and sore throat.   Eyes: Negative.   Respiratory: Positive for cough and wheezing.   Cardiovascular: Negative.  Negative for chest pain, palpitations and leg swelling.  Gastrointestinal: Negative.   Genitourinary: Negative.   Musculoskeletal: Negative.   Skin: Negative.   Neurological: Positive for headaches.  Allergies as of 05/03/2018      Reactions   Abilify [aripiprazole] Other (See Comments)   hallucinations   Naproxen Nausea And Vomiting, Swelling   Ativan [lorazepam] Other (See Comments)   Delirium   Haldol [haloperidol] Other (See Comments)   Hallucinating    Hydroxyzine Other (See Comments)   hallucinations      Medication List        Accurate as of 05/03/18 11:59 PM. Always use your most recent med list.          albuterol (2.5 MG/3ML) 0.083% nebulizer solution Commonly known as:  PROVENTIL Take 3 mLs (2.5 mg total) by nebulization every 4 (four) hours as needed for wheezing or shortness of breath.   albuterol 108 (90 Base) MCG/ACT inhaler Commonly known as:  PROVENTIL HFA;VENTOLIN HFA USE 2 PUFF EVERY 4 HOURS AS NEEDED FOR  WHEEZING OR SHORTNESS OF BREATH   amoxicillin-clavulanate 875-125 MG tablet Commonly known as:  AUGMENTIN Take 1 tablet by mouth 2 (two) times daily.   atorvastatin 20 MG tablet Commonly known as:  LIPITOR Take 1 tablet (20 mg total) by mouth daily.   benztropine 1 MG tablet Commonly known as:  COGENTIN Take 1 tablet (1 mg total) by mouth at bedtime.   BREO ELLIPTA 100-25 MCG/INH Aepb Generic drug:  fluticasone furoate-vilanterol INHALE 1 PUFF INTO THE LUNGS DAILY   celecoxib 200 MG capsule Commonly known as:  CELEBREX TAKE  (1)  CAPSULE  TWICE DAILY.   cetirizine 10 MG tablet Commonly known as:  ZYRTEC TAKE 1 TABLET ONCE DAILY   cloNIDine 0.1 MG tablet Commonly known as:  CATAPRES TAKE ONE TABLET AT BEDTIME   diclofenac sodium 1 % Gel Commonly known as:  VOLTAREN Apply to affected area twice daily as needed   divalproex 500 MG DR tablet Commonly known as:  DEPAKOTE Take 3 tablets (1,500 mg total) by mouth at bedtime.   DULoxetine 60 MG capsule Commonly known as:  CYMBALTA Take 1 capsule (60 mg total) by mouth daily.   fluticasone 50 MCG/ACT nasal spray Commonly known as:  FLONASE Place 1 spray 2 (two) times daily as needed into both nostrils for allergies or rhinitis.   levothyroxine 125 MCG tablet Commonly known as:  SYNTHROID, LEVOTHROID TAKE (1) TABLET DAILY BE- FORE BREAKFAST.   nystatin cream Commonly known as:  MYCOSTATIN APPLY TO THE AFFECTED AREA THREE TIMES DAILY AS NEEDED .Marland KitchenFOR TOPICAL USE ONLY...   omeprazole 20 MG capsule Commonly known as:  PRILOSEC TAKE (1) CAPSULE DAILY   ondansetron 4 MG disintegrating tablet Commonly known as:  ZOFRAN-ODT DISSOLVE 1 TABLET IN MOUTH EVERY 8 HOURS AS NEEDED FOR NAUSEA AND VOMITING   polyethylene glycol powder powder Commonly known as:  GLYCOLAX/MIRALAX Take 17 g by mouth 2 (two) times daily as needed.   predniSONE 10 MG (21) Tbpk tablet Commonly known as:  STERAPRED UNI-PAK 21 TAB As directed x 6  days   promethazine 25 MG tablet Commonly known as:  PHENERGAN Take 1 tablet (25 mg total) by mouth every 8 (eight) hours as needed for nausea or vomiting.   pseudoephedrine 30 MG tablet Commonly known as:  SUDAFED Take 30 mg by mouth every 4 (four) hours as needed for congestion (headache).   risperiDONE 2 MG tablet Commonly known as:  RISPERDAL Take 1 tablet (2 mg total) by mouth daily as needed. For auditory hallucintations   risperidone 4 MG tablet Commonly known as:  RISPERDAL Take 1 tablet (4 mg total) by mouth  at bedtime.   therapeutic multivitamin-minerals tablet Take 1 tablet by mouth daily.   traMADol 50 MG tablet Commonly known as:  ULTRAM Take 1 tablet (50 mg total) by mouth every 8 (eight) hours as needed for moderate pain.          Objective:    BP 122/74   Pulse 80   Temp (!) 97.2 F (36.2 C) (Oral)   Ht 5\' 9"  (1.753 m)   Wt (!) 384 lb 9.6 oz (174.5 kg)   LMP 02/18/1995   SpO2 96% Comment: 3 l of oxygen  BMI 56.80 kg/m   Allergies  Allergen Reactions  . Abilify [Aripiprazole] Other (See Comments)    hallucinations  . Naproxen Nausea And Vomiting and Swelling  . Ativan [Lorazepam] Other (See Comments)    Delirium  . Haldol [Haloperidol] Other (See Comments)    Hallucinating   . Hydroxyzine Other (See Comments)    hallucinations    Wt Readings from Last 3 Encounters:  05/03/18 (!) 384 lb 9.6 oz (174.5 kg)  02/21/18 (!) 383 lb 3.2 oz (173.8 kg)  01/25/18 (!) 386 lb (175.1 kg)    Physical Exam  Constitutional: She is oriented to person, place, and time. She appears well-developed and well-nourished.  HENT:  Head: Normocephalic and atraumatic.  Right Ear: Tympanic membrane and external ear normal. No middle ear effusion.  Left Ear: Tympanic membrane and external ear normal.  No middle ear effusion.  Nose: Mucosal edema and rhinorrhea present. Right sinus exhibits no maxillary sinus tenderness. Left sinus exhibits no maxillary sinus  tenderness.  Mouth/Throat: Uvula is midline. Posterior oropharyngeal erythema present.  Eyes: Pupils are equal, round, and reactive to light. Conjunctivae and EOM are normal. Right eye exhibits no discharge. Left eye exhibits no discharge.  Neck: Normal range of motion.  Cardiovascular: Normal rate, regular rhythm and normal heart sounds.  Pulmonary/Chest: Effort normal and breath sounds normal. No respiratory distress. She has no wheezes.  Abdominal: Soft.  Lymphadenopathy:    She has no cervical adenopathy.  Neurological: She is alert and oriented to person, place, and time.  Skin: Skin is warm and dry.  Psychiatric: She has a normal mood and affect.        Assessment & Plan:   1. Acute non-recurrent frontal sinusitis - albuterol (PROVENTIL) (2.5 MG/3ML) 0.083% nebulizer solution; Take 3 mLs (2.5 mg total) by nebulization every 4 (four) hours as needed for wheezing or shortness of breath.  Dispense: 75 mL; Refill: 0 - albuterol (PROAIR HFA) 108 (90 Base) MCG/ACT inhaler; USE 2 PUFF EVERY 4 HOURS AS NEEDED FOR WHEEZING OR SHORTNESS OF BREATH  Dispense: 8.5 g; Refill: 5 - amoxicillin-clavulanate (AUGMENTIN) 875-125 MG tablet; Take 1 tablet by mouth 2 (two) times daily.  Dispense: 20 tablet; Refill: 0 - predniSONE (STERAPRED UNI-PAK 21 TAB) 10 MG (21) TBPK tablet; As directed x 6 days  Dispense: 21 tablet; Refill: 0  2. Chronic obstructive pulmonary disease, unspecified COPD type ([place]) Continue medications 2 to 3 L oxygen    Continue all other maintenance medications as listed above.  Follow up plan: No follow-ups on file.  Educational handout given for [place] PA-C [place] [address]  [place], [place] [address] [phone]   05/05/2018, 11:08 PM

## 2018-05-06 ENCOUNTER — Other Ambulatory Visit (HOSPITAL_COMMUNITY): Payer: Self-pay | Admitting: Psychiatry

## 2018-05-06 ENCOUNTER — Other Ambulatory Visit: Payer: Self-pay | Admitting: Family Medicine

## 2018-05-06 DIAGNOSIS — G4733 Obstructive sleep apnea (adult) (pediatric): Secondary | ICD-10-CM | POA: Diagnosis not present

## 2018-05-06 DIAGNOSIS — J449 Chronic obstructive pulmonary disease, unspecified: Secondary | ICD-10-CM | POA: Insufficient documentation

## 2018-05-06 DIAGNOSIS — F3160 Bipolar disorder, current episode mixed, unspecified: Secondary | ICD-10-CM

## 2018-05-09 DIAGNOSIS — G4733 Obstructive sleep apnea (adult) (pediatric): Secondary | ICD-10-CM | POA: Diagnosis not present

## 2018-05-11 DIAGNOSIS — G4733 Obstructive sleep apnea (adult) (pediatric): Secondary | ICD-10-CM | POA: Diagnosis not present

## 2018-05-13 ENCOUNTER — Other Ambulatory Visit: Payer: Self-pay | Admitting: Family Medicine

## 2018-05-13 NOTE — Telephone Encounter (Signed)
Aware this was faxed end of last week

## 2018-05-14 ENCOUNTER — Ambulatory Visit (INDEPENDENT_AMBULATORY_CARE_PROVIDER_SITE_OTHER): Payer: Medicare Other | Admitting: Psychiatry

## 2018-05-14 ENCOUNTER — Encounter (HOSPITAL_COMMUNITY): Payer: Self-pay | Admitting: Psychiatry

## 2018-05-14 ENCOUNTER — Encounter: Payer: Self-pay | Admitting: Family Medicine

## 2018-05-14 DIAGNOSIS — F3160 Bipolar disorder, current episode mixed, unspecified: Secondary | ICD-10-CM

## 2018-05-14 MED ORDER — BENZTROPINE MESYLATE 1 MG PO TABS: 1.0000 mg | ORAL_TABLET | Freq: Every day | ORAL | 0 refills | Status: DC

## 2018-05-14 MED ORDER — RISPERIDONE 4 MG PO TABS: 4.0000 mg | ORAL_TABLET | Freq: Every day | ORAL | 0 refills | Status: DC

## 2018-05-14 MED ORDER — DIVALPROEX SODIUM 500 MG PO DR TAB: 1500.0000 mg | DELAYED_RELEASE_TABLET | Freq: Every day | ORAL | 0 refills | Status: DC

## 2018-05-14 MED ORDER — DULOXETINE HCL 60 MG PO CPEP: 60.0000 mg | ORAL_CAPSULE | Freq: Every day | ORAL | 0 refills | Status: DC

## 2018-05-14 NOTE — Progress Notes (Signed)
[place] MD/PA/NP OP Progress Note  05/14/2018 4:00 PM Allison Sharp  MRN:  [id number]  Chief Complaint: I am doing better now but I was hospitalized at [facility] in September.  HPI: Allison Sharp came for her follow-up appointment with her niece.  She was hospitalized in September at [facility] after feeling depressed and having suicidal thoughts.  She was not happy with hospitalization because her Depakote was reduced.  Upon discharge she saw Dr. Leverne Humbles who increased the Depakote to 1500 mg a day which she was taking before.  Overall she feels things are going well.  She is concerned about her memory and overall general health.  Her niece mentioned that sometimes she is easily forgetful and wondering if she can restart Aricept.  She used to take Aricept many years ago but stopped on her own.  Patient denies any paranoia, hallucination or any suicidal thoughts.  Recently she had trip to [place] to attend family wedding.  She mentioned that she had a very good time.  She is tolerating her medication and denies any side effects.  She was also given Risperdal 2 mg extra dose if needed for insomnia or having hallucination.  Her niece told that she has taken 2-3 times extra Risperdal which helped her.  She is compliant with Risperdal 4 mg at bedtime, Cogentin 1 mg at bedtime, Depakote 1500 mg at bedtime and Cymbalta 60 mg daily.  I reviewed last blood work results.  Her Depakote level is 93.  She has no tremors or shakes.  Visit Diagnosis:    ICD-10-CM   1. Mixed bipolar I disorder (HCC) F31.60 risperidone (RISPERDAL) 4 MG tablet    DULoxetine (CYMBALTA) 60 MG capsule    divalproex (DEPAKOTE) 500 MG DR tablet    benztropine (COGENTIN) 1 MG tablet    Past Psychiatric History: Reviewed. History of psychiatric illness with multiple psychiatric inpatient treatment. Her last admissions was in August-September 2019 at [facility]; January 2018 at [place].  History of suicidal attempt by  taking overdose. Diagnosed with PTSD, depression, bipolar disorder. Tried Paxil, Prozac, Wellbutrin, Effexor, Lexapro, amitriptyline, Cymbalta, Abilify, Neurontin, trazodone, Thorazine, hydroxyzine, Luvox, Sinequan, Zyprexa and Latuda. She has best responded on Risperdal and Cymbalta. Her Abilify was change to Risperdal due to tremors.    Past Medical History:  Past Medical History:  Diagnosis Date  . Allergy   . Anxiety   . Arthritis   . Asthma   . Bipolar 1 disorder ([place])   . CAD (coronary artery disease)   . Cellulitis   . CHF (congestive heart failure) (HCC)    diastolic dysfunction  . Chronic back pain   . Chronic headaches   . Complication of anesthesia    States she typically gets sick s/p anesthesia  . Contusion of sacrum   . COPD (chronic obstructive pulmonary disease) ([place])   . Depression   . Dyspnea    PFT 03/05/09 FEV1 2.77(98%), FVC 3.25(86%), FEV1% 85, TLC 5.88(99%), DLCO 60% ,  Methacholine challenge 03/16/09 normal ,  CT chest 03/12/09 no pulmonary disease  . Fungal infection   . GERD (gastroesophageal reflux disease)   . History of colonoscopy 10/17/2002   by Dr Rehman-> distal non-specific proctitis, small ext hemorrhoids,   . HTN (hypertension)   . Hyperlipidemia   . Hyperthyroidism   . Hypothyroidism    States she only has hyperthyroidism  . Migraine headache   . Morbid obesity with body mass index of 50.0-59.9 in adult [facility]) JAN  2011 370 LBS   2004 311 BMI 45.9  . Myocardial infarction ([place])    NOV 1997  . OSA on CPAP    she had been on 2L O2 at night but that was stopped  . PONV (postoperative nausea and vomiting)   . PTSD (post-traumatic stress disorder)   . Suicidal ideation   . Urine incontinence   . Vitamin D deficiency     Past Surgical History:  Procedure Laterality Date  . ABDOMINAL HYSTERECTOMY     sept 1996  . APPENDECTOMY    . BACK SURGERY  2008  . CARDIAC CATHETERIZATION     nov 1997  . CHOLECYSTECTOMY    . COLONOSCOPY   10/17/2002    Distal proctitis, small external hemorrhoids, otherwise/  normal colonoscopy. Suspect rectal bleeding secondary to hemorrhoids  . ESOPHAGOGASTRODUODENOSCOPY  03/18/09   fundic gland polyps/mild gastritis  . HERNIA REPAIR  1978  . JOINT REPLACEMENT     bil knee replacement  . KNEE ARTHROSCOPY    . MULTIPLE EXTRACTIONS WITH ALVEOLOPLASTY N/A 08/16/2015   Procedure: EXTRACTION OF TEETH THREE, SIX, EIGHT, NINE, ELEVEN, FOURTEEN, FIFTEEN, TWENTY-EIGHT WITH ALVEOLOPLASTY;  Surgeon: Diona Browner, DDS;  Location: [facility];  Service: Oral Surgery;  Laterality: N/A;  . TONSILLECTOMY    . TOTAL VAGINAL HYSTERECTOMY    . TUBAL LIGATION      Family Psychiatric History: Reviewed.  Family History:  Family History  Problem Relation Age of Onset  . Hypertension Mother   . Bipolar disorder Mother   . Dementia Mother   . Depression Mother   . Heart attack Mother   . AAA (abdominal aortic aneurysm) Mother   . Coronary artery disease Father   . Alcohol abuse Father   . Hypertension Brother   . Coronary artery disease Brother   . Bipolar disorder Brother   . Depression Brother   . Depression Sister   . Paranoid behavior Sister   . Cancer Sister        breast  . Bipolar disorder Sister   . Depression Sister   . Hypertension Sister   . Cancer Son        thyroid  . Cancer Maternal Aunt        breast metastatized to brain  . Anesthesia problems Neg Hx   . Hypotension Neg Hx   . Malignant hyperthermia Neg Hx   . Pseudochol deficiency Neg Hx     Social History:  Social History   Socioeconomic History  . Marital status: Single    Spouse name: Not on file  . Number of children: 2  . Years of education: Not on file  . Highest education level: Not on file  Occupational History  . Occupation: Disabled    Fish farm manager: UNEMPLOYED    Comment: back problems  Social Needs  . Financial resource strain: Not on file  . Food insecurity:    Worry: Not on file    Inability: Not on file   . Transportation needs:    Medical: Not on file    Non-medical: Not on file  Tobacco Use  . Smoking status: Never Smoker  . Smokeless tobacco: Never Used  Substance and Sexual Activity  . Alcohol use: No    Alcohol/week: 0.0 standard drinks  . Drug use: No  . Sexual activity: Never    Birth control/protection: Abstinence  Lifestyle  . Physical activity:    Days per week: Not on file    Minutes per session: Not on  file  . Stress: Not on file  Relationships  . Social connections:    Talks on phone: Not on file    Gets together: Not on file    Attends religious service: Not on file    Active member of club or organization: Not on file    Attends meetings of clubs or organizations: Not on file    Relationship status: Not on file  Other Topics Concern  . Not on file  Social History Narrative   [name] is disabled and lives with her sister, Noella. She has another sister with whom she has lived with in the past, and who is a Designer, jewellery, but not currently practicing.   She has two grown sons that she does not see often, as they live in other states. She has 5 grand children.   [name] has a long history of mental illness including depression, PTSD, suicidal and homicidal ideation.   She has been obese most all of her life. Her weight has significantly impacted her QOL. She recently lost 20 lbs. By decreasing portion size & increasing proteins.     Allergies:  Allergies  Allergen Reactions  . Abilify [Aripiprazole] Other (See Comments)    hallucinations  . Naproxen Nausea And Vomiting and Swelling  . Ativan [Lorazepam] Other (See Comments)    Delirium  . Haldol [Haloperidol] Other (See Comments)    Hallucinating   . Hydroxyzine Other (See Comments)    hallucinations    Metabolic Disorder Labs: Lab Results  Component Value Date   HGBA1C 5.4 10/22/2017   MPG 108.28 10/22/2017   MPG 105 04/05/2017   No results found for: PROLACTIN Lab Results  Component Value  Date   CHOL 236 (H) 07/02/2015   TRIG 181 (H) 07/02/2015   HDL 39 (L) 07/02/2015   CHOLHDL 6.1 (H) 07/02/2015   VLDL 30.0 12/10/2013   LDLCALC 161 (H) 07/02/2015   LDLCALC 89 12/10/2013   Lab Results  Component Value Date   TSH 5.634 (H) 09/27/2017   TSH 1.950 05/04/2017    Therapeutic Level Labs: No results found for: LITHIUM Lab Results  Component Value Date   VALPROATE 93 02/06/2018   VALPROATE 62 10/22/2017   No components found for:  CBMZ  Current Medications: Current Outpatient Medications  Medication Sig Dispense Refill  . albuterol (PROAIR HFA) 108 (90 Base) MCG/ACT inhaler USE 2 PUFF EVERY 4 HOURS AS NEEDED FOR WHEEZING OR SHORTNESS OF BREATH 8.5 g 5  . albuterol (PROVENTIL) (2.5 MG/3ML) 0.083% nebulizer solution Take 3 mLs (2.5 mg total) by nebulization every 4 (four) hours as needed for wheezing or shortness of breath. 75 mL 0  . amoxicillin-clavulanate (AUGMENTIN) 875-125 MG tablet Take 1 tablet by mouth 2 (two) times daily. 20 tablet 0  . atorvastatin (LIPITOR) 20 MG tablet Take 1 tablet (20 mg total) by mouth daily. 90 tablet 0  . benztropine (COGENTIN) 1 MG tablet Take 1 tablet (1 mg total) by mouth at bedtime. 90 tablet 0  . BREO ELLIPTA 100-25 MCG/INH AEPB INHALE 1 PUFF INTO THE LUNGS DAILY (Patient taking differently: Inhale 1 puff into the lungs daily as needed (sob and wheezing). ) 60 each 5  . celecoxib (CELEBREX) 200 MG capsule TAKE  (1)  CAPSULE  TWICE DAILY. 60 capsule 1  . cetirizine (ZYRTEC) 10 MG tablet TAKE 1 TABLET ONCE DAILY 30 tablet 4  . cloNIDine (CATAPRES) 0.1 MG tablet TAKE ONE TABLET AT BEDTIME 30 tablet 5  . diclofenac  sodium (VOLTAREN) 1 % GEL Apply to affected area twice daily as needed (Patient taking differently: Apply 2 g topically 2 (two) times daily as needed (PAIN). ) 100 g 5  . divalproex (DEPAKOTE) 500 MG DR tablet Take 3 tablets (1,500 mg total) by mouth at bedtime. 270 tablet 0  . DULoxetine (CYMBALTA) 60 MG capsule Take 1 capsule  (60 mg total) by mouth daily. 90 capsule 0  . fluticasone (FLONASE) 50 MCG/ACT nasal spray Place 1 spray 2 (two) times daily as needed into both nostrils for allergies or rhinitis. 16 g 6  . levothyroxine (SYNTHROID, LEVOTHROID) 125 MCG tablet TAKE (1) TABLET DAILY BE- FORE BREAKFAST. (Patient taking differently: Take 125 mcg by mouth daily before breakfast. ) 30 tablet 11  . nystatin cream (MYCOSTATIN) APPLY TO THE AFFECTED AREA THREE TIMES DAILY AS NEEDED .Marland KitchenFOR TOPICAL USE ONLY... (Patient taking differently: Apply 1 application topically 3 (three) times daily as needed for dry skin. ) 30 g 0  . omeprazole (PRILOSEC) 20 MG capsule TAKE (1) CAPSULE DAILY (Patient taking differently: Take 20 mg by mouth daily. ) 30 capsule 4  . ondansetron (ZOFRAN-ODT) 4 MG disintegrating tablet DISSOLVE 1 TABLET IN MOUTH EVERY 8 HOURS AS NEEDED FOR NAUSEA AND VOMITING (Patient taking differently: Take 4 mg by mouth every 8 (eight) hours as needed for nausea or vomiting. ) 20 tablet 0  . polyethylene glycol powder (GLYCOLAX/MIRALAX) powder Take 17 g by mouth 2 (two) times daily as needed. (Patient taking differently: Take 17 g by mouth 2 (two) times daily as needed for moderate constipation. ) 3350 g 1  . predniSONE (STERAPRED UNI-PAK 21 TAB) 10 MG (21) TBPK tablet As directed x 6 days 21 tablet 0  . promethazine (PHENERGAN) 25 MG tablet Take 1 tablet (25 mg total) by mouth every 8 (eight) hours as needed for nausea or vomiting. 20 tablet 0  . pseudoephedrine (SUDAFED) 30 MG tablet Take 30 mg by mouth every 4 (four) hours as needed for congestion (headache).    . risperiDONE (RISPERDAL) 2 MG tablet Take 1 tablet (2 mg total) by mouth daily as needed. For auditory hallucintations 30 tablet 2  . risperidone (RISPERDAL) 4 MG tablet Take 1 tablet (4 mg total) by mouth at bedtime. 90 tablet 0  . therapeutic multivitamin-minerals (THERAGRAN-M) tablet Take 1 tablet by mouth daily.    . traMADol (ULTRAM) 50 MG tablet Take 1  tablet (50 mg total) by mouth every 8 (eight) hours as needed for moderate pain. (Patient taking differently: Take 50 mg by mouth every 8 (eight) hours. ) 90 tablet 2   No current facility-administered medications for this visit.      Musculoskeletal: Strength & Muscle Tone: decreased Gait & Station: unsteady Patient leans: Left and Front  Psychiatric Specialty Exam: ROS  Blood pressure (!) 130/91, pulse 90, height 5\' 9"  (1.753 m), weight (!) 387 lb (175.5 kg), last menstrual period 02/18/1995, SpO2 98 %.Body mass index is 56.8 kg/m.  General Appearance: Casual and Overweight  Eye Contact:  Fair  Speech:  Slow  Volume:  Normal  Mood:  Anxious  Affect:  Congruent  Thought Process:  Descriptions of Associations: Intact  Orientation:  Full (Time, Place, and Person)  Thought Content: Rumination   Suicidal Thoughts:  No  Homicidal Thoughts:  No  Memory:  Immediate;   Fair Recent;   Good Remote;   Fair  Judgement:  Good  Insight:  Present  Psychomotor Activity:  Decreased  Concentration:  Concentration: Fair  and Attention Span: Fair  Recall:  [employer] of Knowledge: Fair  Language: Good  Akathisia:  No  Handed:  Right  AIMS (if indicated): not done  Assets:  Desire for Improvement Housing Social Support  ADL's:  Intact  Cognition: Impaired,  Mild  Sleep:  Fair   Screenings: AIMS     Admission (Discharged) from 01/17/2015 in [facility]  AIMS Total Score  0    AUDIT     Admission (Discharged) from 01/17/2015 in [facility] Admission (Discharged) from 10/24/2012 in [facility]  Alcohol Use Disorder Identification Test Final Score (AUDIT)  0  0    Mini-Mental     Clinical Support from 11/08/2016 in [facility] from 06/28/2015 in [facility]  Total Score (max 30 points )  30  29    PHQ2-9     Office Visit  from 05/03/2018 in [facility] Office Visit from 10/08/2017 in [facility] Office Visit from 07/20/2017 in [facility] Office Visit from 07/02/2017 in [facility] Office Visit from 05/04/2017 in [facility]  PHQ-2 Total Score  0  0  0  0  0       Assessment and Plan: Bipolar disorder type I.  Generalized anxiety disorder.  I reviewed blood work results and previous notes.  Patient is doing better on her current medication.  Depakote level within therapeutic range.  She is concerned about her memory.  Recommended to see neurology and we will call [facility] neurology to get appointment.  Continue Risperdal 4 mg at bedtime and if needed 2 mg extra to help insomnia and paranoia.  Continue Cogentin 1 mg at bedtime and Cymbalta 60 mg daily.  Recommended to call us back if is any question or any concern.  Follow-up in 3 months.   Kathlee Nations, MD 05/14/2018, 4:00 PM

## 2018-05-23 DIAGNOSIS — W19XXXA Unspecified fall, initial encounter: Secondary | ICD-10-CM | POA: Diagnosis not present

## 2018-05-23 DIAGNOSIS — I951 Orthostatic hypotension: Secondary | ICD-10-CM | POA: Diagnosis not present

## 2018-05-24 DIAGNOSIS — Z1231 Encounter for screening mammogram for malignant neoplasm of breast: Secondary | ICD-10-CM | POA: Diagnosis not present

## 2018-05-24 LAB — HM MAMMOGRAPHY

## 2018-06-04 ENCOUNTER — Other Ambulatory Visit: Payer: Self-pay | Admitting: Family Medicine

## 2018-06-06 ENCOUNTER — Ambulatory Visit: Payer: Medicare Other | Admitting: Family Medicine

## 2018-06-12 ENCOUNTER — Ambulatory Visit: Payer: Medicare Other | Admitting: Family Medicine

## 2018-06-17 DIAGNOSIS — R2681 Unsteadiness on feet: Secondary | ICD-10-CM | POA: Diagnosis not present

## 2018-06-17 DIAGNOSIS — W19XXXA Unspecified fall, initial encounter: Secondary | ICD-10-CM | POA: Diagnosis not present

## 2018-06-17 DIAGNOSIS — J449 Chronic obstructive pulmonary disease, unspecified: Secondary | ICD-10-CM | POA: Diagnosis not present

## 2018-06-17 DIAGNOSIS — G4733 Obstructive sleep apnea (adult) (pediatric): Secondary | ICD-10-CM | POA: Diagnosis not present

## 2018-06-23 DIAGNOSIS — I951 Orthostatic hypotension: Secondary | ICD-10-CM | POA: Diagnosis not present

## 2018-06-23 DIAGNOSIS — W19XXXA Unspecified fall, initial encounter: Secondary | ICD-10-CM | POA: Diagnosis not present

## 2018-06-27 ENCOUNTER — Ambulatory Visit: Payer: Medicare Other | Admitting: Family Medicine

## 2018-06-28 ENCOUNTER — Ambulatory Visit (INDEPENDENT_AMBULATORY_CARE_PROVIDER_SITE_OTHER): Payer: Medicare Other | Admitting: Family Medicine

## 2018-06-28 ENCOUNTER — Encounter: Payer: Self-pay | Admitting: Family Medicine

## 2018-06-28 ENCOUNTER — Other Ambulatory Visit: Payer: Self-pay | Admitting: Family Medicine

## 2018-06-28 ENCOUNTER — Telehealth: Payer: Self-pay | Admitting: Family Medicine

## 2018-06-28 VITALS — BP 125/83 | HR 94 | Temp 96.9°F | Ht 69.0 in | Wt 390.4 lb

## 2018-06-28 DIAGNOSIS — R300 Dysuria: Secondary | ICD-10-CM | POA: Diagnosis not present

## 2018-06-28 DIAGNOSIS — N3000 Acute cystitis without hematuria: Secondary | ICD-10-CM | POA: Diagnosis not present

## 2018-06-28 DIAGNOSIS — R413 Other amnesia: Secondary | ICD-10-CM

## 2018-06-28 DIAGNOSIS — J438 Other emphysema: Secondary | ICD-10-CM

## 2018-06-28 DIAGNOSIS — G4733 Obstructive sleep apnea (adult) (pediatric): Secondary | ICD-10-CM | POA: Diagnosis not present

## 2018-06-28 LAB — URINALYSIS, COMPLETE
Bilirubin, UA: NEGATIVE
Glucose, UA: NEGATIVE
Leukocytes, UA: NEGATIVE
Nitrite, UA: NEGATIVE
Protein, UA: NEGATIVE
RBC, UA: NEGATIVE
Specific Gravity, UA: >1.03 — ABNORMAL HIGH (ref 1.005–1.030)
Urobilinogen, Ur: 1 mg/dL (ref 0.2–1.0)
pH, UA: 5.5 (ref 5.0–7.5)

## 2018-06-28 LAB — MICROSCOPIC EXAMINATION: Renal Epithel, UA: NONE SEEN /hpf

## 2018-06-28 MED ORDER — TRAMADOL HCL 50 MG PO TABS: 50.0000 mg | ORAL_TABLET | Freq: Three times a day (TID) | ORAL | 2 refills | Status: DC | PRN

## 2018-06-28 MED ORDER — CEPHALEXIN 500 MG PO CAPS: 500.0000 mg | ORAL_CAPSULE | Freq: Four times a day (QID) | ORAL | 0 refills | Status: DC

## 2018-06-28 NOTE — Telephone Encounter (Signed)
appt today

## 2018-06-28 NOTE — Telephone Encounter (Signed)
Faxed to [facility]  

## 2018-06-28 NOTE — Progress Notes (Addendum)
BP 125/83   Pulse 94   Temp (!) 96.9 F (36.1 C) (Oral)   Ht 5\' 9"  (1.753 m)   Wt (!) 390 lb 6.4 oz (177.1 kg)   LMP 02/18/1995   SpO2 99% Comment: with 2 1/2 oxygen  BMI 57.65 kg/m    Subjective:    Patient ID: Allison Sharp, female    DOB: 06/23/1955, 63 y.o.   MRN: [id number]  HPI: Allison Sharp is a 63 y.o. female presenting on 06/28/2018 for Dysuria (x 1 week) and face to face for oxygen   HPI Patient comes in complaining of dysuria and frequency that has been going on for the past week.  She did start taking some Keflex that she had leftover and has taken 3 days of 4 times per day Keflex and does feel like it is helping some but she wants to know if she needs a full course of it.  She denies any flank pains or vaginal discharge.  She has been having frequency and the dysuria but denies any blood in her urine.  Patient is coming in complaining of memory impairment that has persisted over the past year.  Initially she had a lot of oxygen problems and acute delirium related to urinary tract infections and she is coming because she is still having memory impairment despite not having any of those things going on any further.  COPD Patient is coming in for COPD recheck today.  He is currently on albuterol and 2.5 L nasal cannula.  He has a mild chronic cough but denies any major coughing spells or wheezing spells.  He has 1nighttime symptoms per week and 2daytime symptoms per week currently when she is on her oxygen.   Relevant past medical, surgical, family and social history reviewed and updated as indicated. Interim medical history since our last visit reviewed. Allergies and medications reviewed and updated.  Review of Systems  Constitutional: Negative for chills and fever.  Eyes: Negative for redness and visual disturbance.  Respiratory: Negative for chest tightness and shortness of breath.   Cardiovascular: Negative for chest pain and leg swelling.  Genitourinary:  Positive for dysuria and frequency. Negative for difficulty urinating.  Musculoskeletal: Negative for back pain and gait problem.  Skin: Negative for rash.  Neurological: Negative for light-headedness and headaches.  Psychiatric/Behavioral: Positive for confusion and decreased concentration. Negative for agitation and behavioral problems.  All other systems reviewed and are negative.   Per HPI unless specifically indicated above   Allergies as of 06/28/2018      Reactions   Abilify [aripiprazole] Other (See Comments)   hallucinations   Naproxen Nausea And Vomiting, Swelling   Ativan [lorazepam] Other (See Comments)   Delirium   Haldol [haloperidol] Other (See Comments)   Hallucinating    Hydroxyzine Other (See Comments)   hallucinations      Medication List       Accurate as of June 28, 2018 10:00 AM. Always use your most recent med list.        albuterol (2.5 MG/3ML) 0.083% nebulizer solution Commonly known as:  PROVENTIL Take 3 mLs (2.5 mg total) by nebulization every 4 (four) hours as needed for wheezing or shortness of breath.   albuterol 108 (90 Base) MCG/ACT inhaler Commonly known as:  PROAIR HFA USE 2 PUFF EVERY 4 HOURS AS NEEDED FOR WHEEZING OR SHORTNESS OF BREATH   atorvastatin 20 MG tablet Commonly known as:  LIPITOR Take 1 tablet (20 mg total) by mouth  daily.   benztropine 1 MG tablet Commonly known as:  COGENTIN Take 1 tablet (1 mg total) by mouth at bedtime.   BREO ELLIPTA 100-25 MCG/INH Aepb Generic drug:  fluticasone furoate-vilanterol INHALE 1 PUFF INTO THE LUNGS DAILY   celecoxib 200 MG capsule Commonly known as:  CELEBREX TAKE  (1)  CAPSULE  TWICE DAILY.   cephALEXin 500 MG capsule Commonly known as:  KEFLEX Take 1 capsule (500 mg total) by mouth 4 (four) times daily.   cetirizine 10 MG tablet Commonly known as:  ZYRTEC TAKE 1 TABLET ONCE DAILY   cloNIDine 0.1 MG tablet Commonly known as:  CATAPRES TAKE ONE TABLET AT BEDTIME     diclofenac sodium 1 % Gel Commonly known as:  VOLTAREN Apply to affected area twice daily as needed   divalproex 500 MG DR tablet Commonly known as:  DEPAKOTE Take 3 tablets (1,500 mg total) by mouth at bedtime.   DULoxetine 60 MG capsule Commonly known as:  CYMBALTA Take 1 capsule (60 mg total) by mouth daily.   fluticasone 50 MCG/ACT nasal spray Commonly known as:  FLONASE Place 1 spray 2 (two) times daily as needed into both nostrils for allergies or rhinitis.   levothyroxine 125 MCG tablet Commonly known as:  SYNTHROID, LEVOTHROID TAKE (1) TABLET DAILY BE- FORE BREAKFAST.   nystatin cream Commonly known as:  MYCOSTATIN APPLY TO THE AFFECTED AREA THREE TIMES DAILY AS NEEDED .Marland KitchenFOR TOPICAL USE ONLY...   omeprazole 20 MG capsule Commonly known as:  PRILOSEC TAKE (1) CAPSULE DAILY   ondansetron 4 MG disintegrating tablet Commonly known as:  ZOFRAN-ODT DISSOLVE 1 TABLET IN MOUTH EVERY 8 HOURS AS NEEDED FOR NAUSEA AND VOMITING   polyethylene glycol powder powder Commonly known as:  GLYCOLAX/MIRALAX Take 17 g by mouth 2 (two) times daily as needed.   promethazine 25 MG tablet Commonly known as:  PHENERGAN Take 1 tablet (25 mg total) by mouth every 8 (eight) hours as needed for nausea or vomiting.   risperiDONE 2 MG tablet Commonly known as:  RISPERDAL Take 1 tablet (2 mg total) by mouth daily as needed. For auditory hallucintations   risperidone 4 MG tablet Commonly known as:  RISPERDAL Take 1 tablet (4 mg total) by mouth at bedtime.   therapeutic multivitamin-minerals tablet Take 1 tablet by mouth daily.   traMADol 50 MG tablet Commonly known as:  ULTRAM Take 1 tablet (50 mg total) by mouth every 8 (eight) hours as needed for moderate pain.          Objective:    BP 125/83   Pulse 94   Temp (!) 96.9 F (36.1 C) (Oral)   Ht 5\' 9"  (1.753 m)   Wt (!) 390 lb 6.4 oz (177.1 kg)   LMP 02/18/1995   SpO2 99% Comment: with 2 1/2 oxygen  BMI 57.65 kg/m   Wt  Readings from Last 3 Encounters:  06/28/18 (!) 390 lb 6.4 oz (177.1 kg)  05/03/18 (!) 384 lb 9.6 oz (174.5 kg)  02/21/18 (!) 383 lb 3.2 oz (173.8 kg)    Physical Exam Vitals signs and nursing note reviewed.  Constitutional:      General: She is not in acute distress.    Appearance: She is well-developed. She is not diaphoretic.  Eyes:     Conjunctiva/sclera: Conjunctivae normal.  Cardiovascular:     Rate and Rhythm: Normal rate and regular rhythm.     Heart sounds: Normal heart sounds. No murmur.  Pulmonary:  Effort: Pulmonary effort is normal. No respiratory distress.     Breath sounds: Normal breath sounds. No wheezing.  Skin:    General: Skin is warm and dry.     Findings: No rash.  Neurological:     Mental Status: She is alert and oriented to person, place, and time.     Coordination: Coordination normal.  Psychiatric:        Behavior: Behavior normal.        Thought Content: Thought content does not include suicidal ideation. Thought content does not include suicidal plan.        Cognition and Memory: Memory is impaired.     Urinalysis: 6-10 WBCs and 0-2 RBCs and 0-10 epithelial cells and yeast present and few bacteria.  Trace ketones    Assessment & Plan:   Problem List Items Addressed This Visit      Respiratory   Chronic obstructive pulmonary disease ([place]) - Primary    Other Visit Diagnoses    Acute cystitis without hematuria       Relevant Medications   cephALEXin (KEFLEX) 500 MG capsule   Other Relevant Orders   Urinalysis, Complete   Urine Culture   Memory impairment       Relevant Orders   Ambulatory referral to Neurology      Patient's oxygen dipped down to 88% on room air and 85% while walking and then came back up to 99% on 2-1/2 L nasal cannula, she did testing with [facility] but said she would be able to do the pulse dosing 2 L nasal cannula and she needs order so she can get the travel compressor  Patient has been having some memory  impairment and would like a referral to neurology, we initially thought this was from acute delirium from recurrent UTIs but it has persisted and she wants the referral. Follow up plan: Return if symptoms worsen or fail to improve.  Counseling provided for all of the vaccine components Orders Placed This Encounter  Procedures  . Urinalysis, Complete  . Ambulatory referral to Neurology    [name], MD [facility] 06/28/2018, 10:00 AM

## 2018-06-29 LAB — URINE CULTURE

## 2018-07-15 ENCOUNTER — Other Ambulatory Visit: Payer: Self-pay | Admitting: Family Medicine

## 2018-07-20 DIAGNOSIS — H5203 Hypermetropia, bilateral: Secondary | ICD-10-CM | POA: Diagnosis not present

## 2018-07-22 ENCOUNTER — Telehealth: Payer: Self-pay | Admitting: Family Medicine

## 2018-07-24 DIAGNOSIS — W19XXXA Unspecified fall, initial encounter: Secondary | ICD-10-CM | POA: Diagnosis not present

## 2018-07-24 DIAGNOSIS — I951 Orthostatic hypotension: Secondary | ICD-10-CM | POA: Diagnosis not present

## 2018-07-25 ENCOUNTER — Other Ambulatory Visit: Payer: Self-pay | Admitting: Family Medicine

## 2018-08-06 ENCOUNTER — Emergency Department (HOSPITAL_COMMUNITY): Payer: Medicare Other

## 2018-08-06 ENCOUNTER — Encounter (HOSPITAL_COMMUNITY): Payer: Self-pay

## 2018-08-06 ENCOUNTER — Ambulatory Visit (HOSPITAL_COMMUNITY): Payer: Medicare Other | Admitting: Psychiatry

## 2018-08-06 ENCOUNTER — Other Ambulatory Visit: Payer: Self-pay

## 2018-08-06 ENCOUNTER — Observation Stay (HOSPITAL_COMMUNITY)
Admission: EM | Admit: 2018-08-06 | Discharge: 2018-08-08 | Disposition: A | Discharge status: Home / Self Care | Payer: Medicare Other | Attending: Internal Medicine | Admitting: Internal Medicine

## 2018-08-06 DIAGNOSIS — Z96653 Presence of artificial knee joint, bilateral: Secondary | ICD-10-CM | POA: Insufficient documentation

## 2018-08-06 DIAGNOSIS — I110 Hypertensive heart disease with heart failure: Secondary | ICD-10-CM | POA: Diagnosis not present

## 2018-08-06 DIAGNOSIS — F4310 Post-traumatic stress disorder, unspecified: Secondary | ICD-10-CM | POA: Diagnosis not present

## 2018-08-06 DIAGNOSIS — Z9989 Dependence on other enabling machines and devices: Secondary | ICD-10-CM

## 2018-08-06 DIAGNOSIS — J449 Chronic obstructive pulmonary disease, unspecified: Secondary | ICD-10-CM | POA: Diagnosis not present

## 2018-08-06 DIAGNOSIS — I890 Lymphedema, not elsewhere classified: Secondary | ICD-10-CM | POA: Insufficient documentation

## 2018-08-06 DIAGNOSIS — K219 Gastro-esophageal reflux disease without esophagitis: Secondary | ICD-10-CM | POA: Insufficient documentation

## 2018-08-06 DIAGNOSIS — E039 Hypothyroidism, unspecified: Secondary | ICD-10-CM | POA: Diagnosis present

## 2018-08-06 DIAGNOSIS — I25118 Atherosclerotic heart disease of native coronary artery with other forms of angina pectoris: Principal | ICD-10-CM | POA: Insufficient documentation

## 2018-08-06 DIAGNOSIS — I252 Old myocardial infarction: Secondary | ICD-10-CM | POA: Insufficient documentation

## 2018-08-06 DIAGNOSIS — G8929 Other chronic pain: Secondary | ICD-10-CM | POA: Diagnosis not present

## 2018-08-06 DIAGNOSIS — Z9981 Dependence on supplemental oxygen: Secondary | ICD-10-CM | POA: Insufficient documentation

## 2018-08-06 DIAGNOSIS — Z888 Allergy status to other drugs, medicaments and biological substances status: Secondary | ICD-10-CM | POA: Insufficient documentation

## 2018-08-06 DIAGNOSIS — Z886 Allergy status to analgesic agent status: Secondary | ICD-10-CM | POA: Diagnosis not present

## 2018-08-06 DIAGNOSIS — Z79899 Other long term (current) drug therapy: Secondary | ICD-10-CM | POA: Diagnosis not present

## 2018-08-06 DIAGNOSIS — M1990 Unspecified osteoarthritis, unspecified site: Secondary | ICD-10-CM | POA: Diagnosis not present

## 2018-08-06 DIAGNOSIS — Z6841 Body Mass Index (BMI) 40.0 and over, adult: Secondary | ICD-10-CM | POA: Insufficient documentation

## 2018-08-06 DIAGNOSIS — G4733 Obstructive sleep apnea (adult) (pediatric): Secondary | ICD-10-CM | POA: Diagnosis not present

## 2018-08-06 DIAGNOSIS — I5032 Chronic diastolic (congestive) heart failure: Secondary | ICD-10-CM | POA: Insufficient documentation

## 2018-08-06 DIAGNOSIS — E559 Vitamin D deficiency, unspecified: Secondary | ICD-10-CM | POA: Diagnosis not present

## 2018-08-06 DIAGNOSIS — I10 Essential (primary) hypertension: Secondary | ICD-10-CM | POA: Diagnosis present

## 2018-08-06 DIAGNOSIS — F315 Bipolar disorder, current episode depressed, severe, with psychotic features: Secondary | ICD-10-CM | POA: Diagnosis present

## 2018-08-06 DIAGNOSIS — R079 Chest pain, unspecified: Secondary | ICD-10-CM | POA: Diagnosis not present

## 2018-08-06 DIAGNOSIS — R9431 Abnormal electrocardiogram [ECG] [EKG]: Secondary | ICD-10-CM

## 2018-08-06 DIAGNOSIS — F319 Bipolar disorder, unspecified: Secondary | ICD-10-CM | POA: Insufficient documentation

## 2018-08-06 DIAGNOSIS — I200 Unstable angina: Secondary | ICD-10-CM

## 2018-08-06 DIAGNOSIS — E785 Hyperlipidemia, unspecified: Secondary | ICD-10-CM | POA: Diagnosis not present

## 2018-08-06 DIAGNOSIS — I2510 Atherosclerotic heart disease of native coronary artery without angina pectoris: Secondary | ICD-10-CM | POA: Diagnosis present

## 2018-08-06 DIAGNOSIS — I4581 Long QT syndrome: Secondary | ICD-10-CM | POA: Diagnosis not present

## 2018-08-06 LAB — COMPREHENSIVE METABOLIC PANEL
ALT: 12 U/L (ref 0–44)
ANION GAP: 7 (ref 5–15)
AST: 19 U/L (ref 15–41)
Albumin: 3.6 g/dL (ref 3.5–5.0)
Alkaline Phosphatase: 61 U/L (ref 38–126)
BUN: 12 mg/dL (ref 8–23)
CO2: 28 mmol/L (ref 22–32)
Calcium: 9.1 mg/dL (ref 8.9–10.3)
Chloride: 105 mmol/L (ref 98–111)
Creatinine, Ser: 0.9 mg/dL (ref 0.44–1.00)
GFR calc Af Amer: >60 mL/min (ref >60)
GFR calc non Af Amer: >60 mL/min (ref >60)
GLUCOSE: 105 mg/dL — AB (ref 70–99)
POTASSIUM: 3.8 mmol/L (ref 3.5–5.1)
Sodium: 140 mmol/L (ref 135–145)
Total Bilirubin: 0.7 mg/dL (ref 0.3–1.2)
Total Protein: 6.2 g/dL — ABNORMAL LOW (ref 6.5–8.1)

## 2018-08-06 LAB — CBC
HCT: 39 % (ref 36.0–46.0)
Hemoglobin: 12.6 g/dL (ref 12.0–15.0)
MCH: 31.2 pg (ref 26.0–34.0)
MCHC: 32.3 g/dL (ref 30.0–36.0)
MCV: 96.5 fL (ref 80.0–100.0)
Platelets: 98 10*3/uL — ABNORMAL LOW (ref 150–400)
RBC: 4.04 MIL/uL (ref 3.87–5.11)
RDW: 12.8 % (ref 11.5–15.5)
WBC: 6.2 10*3/uL (ref 4.0–10.5)
nRBC: 0 % (ref 0.0–0.2)

## 2018-08-06 LAB — I-STAT TROPONIN, ED: Troponin i, poc: 0 ng/mL (ref 0.00–0.08)

## 2018-08-06 LAB — APTT: aPTT: 28 seconds (ref 24–36)

## 2018-08-06 LAB — BRAIN NATRIURETIC PEPTIDE: B Natriuretic Peptide: 23.9 pg/mL (ref 0.0–100.0)

## 2018-08-06 LAB — HEPARIN LEVEL (UNFRACTIONATED): Heparin Unfractionated: 0.23 IU/mL — ABNORMAL LOW (ref 0.30–0.70)

## 2018-08-06 LAB — PROTIME-INR
INR: 0.9 (ref 0.8–1.2)
PROTHROMBIN TIME: 12.5 s (ref 11.4–15.2)

## 2018-08-06 LAB — MAGNESIUM: Magnesium: 2.1 mg/dL (ref 1.7–2.4)

## 2018-08-06 LAB — TROPONIN I: Troponin I: <0.03 ng/mL (ref <0.03)

## 2018-08-06 LAB — CBG MONITORING, ED: Glucose-Capillary: 82 mg/dL (ref 70–99)

## 2018-08-06 MED ORDER — CELECOXIB 200 MG PO CAPS
200.0000 mg | ORAL_CAPSULE | Freq: Two times a day (BID) | ORAL | Status: DC
  Administered 2018-08-06 – 2018-08-08 (×4): 200 mg via ORAL
  Filled 2018-08-06 (×5): qty 1

## 2018-08-06 MED ORDER — ALBUTEROL SULFATE (2.5 MG/3ML) 0.083% IN NEBU: 2.5000 mg | INHALATION_SOLUTION | RESPIRATORY_TRACT | Status: DC | PRN

## 2018-08-06 MED ORDER — PROMETHAZINE HCL 25 MG PO TABS: 25.0000 mg | ORAL_TABLET | Freq: Three times a day (TID) | ORAL | Status: DC | PRN

## 2018-08-06 MED ORDER — ENSURE ENLIVE PO LIQD
237.0000 mL | Freq: Two times a day (BID) | ORAL | Status: DC
  Administered 2018-08-07 – 2018-08-08 (×3): 237 mL via ORAL

## 2018-08-06 MED ORDER — MAGNESIUM SULFATE 2 GM/50ML IV SOLN
2.0000 g | Freq: Once | INTRAVENOUS | Status: AC
  Administered 2018-08-06: 2 g via INTRAVENOUS
  Filled 2018-08-06: qty 50

## 2018-08-06 MED ORDER — ENOXAPARIN SODIUM 40 MG/0.4ML ~~LOC~~ SOLN: 40.0000 mg | SUBCUTANEOUS | Status: DC

## 2018-08-06 MED ORDER — MAGNESIUM SULFATE 2 GM/50ML IV SOLN
2.0000 g | INTRAVENOUS | Status: AC
  Administered 2018-08-06: 2 g via INTRAVENOUS
  Filled 2018-08-06: qty 50

## 2018-08-06 MED ORDER — PANTOPRAZOLE SODIUM 40 MG PO TBEC
40.0000 mg | DELAYED_RELEASE_TABLET | Freq: Every day | ORAL | Status: DC
  Administered 2018-08-07 – 2018-08-08 (×2): 40 mg via ORAL
  Filled 2018-08-06 (×2): qty 1

## 2018-08-06 MED ORDER — ACETAMINOPHEN 325 MG PO TABS
650.0000 mg | ORAL_TABLET | Freq: Four times a day (QID) | ORAL | Status: DC | PRN
  Administered 2018-08-06 – 2018-08-08 (×3): 650 mg via ORAL
  Filled 2018-08-06 (×4): qty 2

## 2018-08-06 MED ORDER — TRAMADOL HCL 50 MG PO TABS
50.0000 mg | ORAL_TABLET | Freq: Three times a day (TID) | ORAL | Status: DC | PRN
  Administered 2018-08-07: 50 mg via ORAL
  Filled 2018-08-06: qty 1

## 2018-08-06 MED ORDER — ACETAMINOPHEN 650 MG RE SUPP: 650.0000 mg | Freq: Four times a day (QID) | RECTAL | Status: DC | PRN

## 2018-08-06 MED ORDER — NITROGLYCERIN 0.4 MG SL SUBL
0.4000 mg | SUBLINGUAL_TABLET | SUBLINGUAL | Status: DC | PRN
  Administered 2018-08-06 – 2018-08-07 (×6): 0.4 mg via SUBLINGUAL
  Filled 2018-08-06 (×3): qty 1

## 2018-08-06 MED ORDER — DULOXETINE HCL 60 MG PO CPEP
60.0000 mg | ORAL_CAPSULE | Freq: Every day | ORAL | Status: DC
  Administered 2018-08-07 – 2018-08-08 (×2): 60 mg via ORAL
  Filled 2018-08-06 (×2): qty 1

## 2018-08-06 MED ORDER — BENZTROPINE MESYLATE 0.5 MG PO TABS
1.0000 mg | ORAL_TABLET | Freq: Every day | ORAL | Status: DC
  Administered 2018-08-06 – 2018-08-07 (×2): 1 mg via ORAL
  Filled 2018-08-06 (×2): qty 2

## 2018-08-06 MED ORDER — LEVOTHYROXINE SODIUM 25 MCG PO TABS
125.0000 ug | ORAL_TABLET | Freq: Every day | ORAL | Status: DC
  Administered 2018-08-07 – 2018-08-08 (×2): 125 ug via ORAL
  Filled 2018-08-06 (×2): qty 1

## 2018-08-06 MED ORDER — POLYETHYLENE GLYCOL 3350 17 G PO PACK: 17.0000 g | PACK | Freq: Two times a day (BID) | ORAL | Status: DC | PRN

## 2018-08-06 MED ORDER — HEPARIN (PORCINE) 25000 UT/250ML-% IV SOLN
1750.0000 [IU]/h | INTRAVENOUS | Status: DC
  Administered 2018-08-06: 1600 [IU]/h via INTRAVENOUS
  Administered 2018-08-07: 1750 [IU]/h via INTRAVENOUS
  Filled 2018-08-06 (×2): qty 250

## 2018-08-06 MED ORDER — LORATADINE 10 MG PO TABS
10.0000 mg | ORAL_TABLET | Freq: Every day | ORAL | Status: DC
  Administered 2018-08-07 – 2018-08-08 (×2): 10 mg via ORAL
  Filled 2018-08-06 (×2): qty 1

## 2018-08-06 MED ORDER — RISPERIDONE 1 MG PO TABS: 2.0000 mg | ORAL_TABLET | Freq: Every day | ORAL | Status: DC | PRN

## 2018-08-06 MED ORDER — DIVALPROEX SODIUM 250 MG PO DR TAB
1500.0000 mg | DELAYED_RELEASE_TABLET | Freq: Every day | ORAL | Status: DC
  Administered 2018-08-06 – 2018-08-07 (×2): 1500 mg via ORAL
  Filled 2018-08-06 (×2): qty 6

## 2018-08-06 MED ORDER — ATORVASTATIN CALCIUM 20 MG PO TABS
20.0000 mg | ORAL_TABLET | Freq: Every day | ORAL | Status: DC
  Administered 2018-08-07 – 2018-08-08 (×2): 20 mg via ORAL
  Filled 2018-08-06 (×2): qty 1

## 2018-08-06 MED ORDER — POTASSIUM CHLORIDE CRYS ER 20 MEQ PO TBCR
40.0000 meq | EXTENDED_RELEASE_TABLET | Freq: Once | ORAL | Status: AC
  Administered 2018-08-06: 40 meq via ORAL
  Filled 2018-08-06: qty 2

## 2018-08-06 MED ORDER — ASPIRIN 81 MG PO CHEW
324.0000 mg | CHEWABLE_TABLET | Freq: Once | ORAL | Status: AC
  Administered 2018-08-06: 324 mg via ORAL
  Filled 2018-08-06: qty 4

## 2018-08-06 MED ORDER — SODIUM CHLORIDE 0.9% FLUSH
3.0000 mL | Freq: Once | INTRAVENOUS | Status: AC
  Administered 2018-08-06: 3 mL via INTRAVENOUS

## 2018-08-06 MED ORDER — HEPARIN BOLUS VIA INFUSION
4000.0000 [IU] | Freq: Once | INTRAVENOUS | Status: AC
  Administered 2018-08-06: 4000 [IU] via INTRAVENOUS
  Filled 2018-08-06: qty 4000

## 2018-08-06 MED ORDER — RISPERIDONE 1 MG PO TABS
4.0000 mg | ORAL_TABLET | Freq: Every day | ORAL | Status: DC
  Administered 2018-08-06 – 2018-08-07 (×2): 4 mg via ORAL
  Filled 2018-08-06 (×2): qty 4

## 2018-08-06 MED ORDER — POLYETHYLENE GLYCOL 3350 17 GM/SCOOP PO POWD: 17.0000 g | Freq: Two times a day (BID) | ORAL | Status: DC | PRN

## 2018-08-06 MED ORDER — CLONIDINE HCL 0.1 MG PO TABS
0.1000 mg | ORAL_TABLET | Freq: Every day | ORAL | Status: DC
  Administered 2018-08-06 – 2018-08-07 (×2): 0.1 mg via ORAL
  Filled 2018-08-06 (×2): qty 1

## 2018-08-06 MED ORDER — PROCHLORPERAZINE EDISYLATE 10 MG/2ML IJ SOLN: 10.0000 mg | INTRAMUSCULAR | Status: DC | PRN

## 2018-08-06 NOTE — ED Triage Notes (Signed)
Patient c/o intermittent left chest pain x 1 week. patient states she feels more SOB than usual. Patient states pain radiates into the left neck. Patient wears home O2 for COPD.

## 2018-08-06 NOTE — ED Provider Notes (Signed)
[facility] DEPT Provider Note   CSN: [id number] Arrival date & time: 08/06/18  1348    History   Chief Complaint Chief Complaint  Patient presents with  . Chest Pain    HPI Allison Sharp is a 63 y.o. female who  has a past medical history of  Anxiety, Arthritis, Asthma, Bipolar 1 disorder, CAD , Cellulitis, CHF, Chronic back pain, Chronic headaches,  COPD with chronic hypoxic respiratory failure on continuous 4 L 02. Depression GERD, HTN, Hyperlipidemia, Hyperthyroidism, Hypothyroidism, Migraine headache, Morbid obesity, Myocardial infarction ([place]), OSA on CPAP, PTSD, Suicidal ideation, Urine incontinence, and Vitamin D deficiency.  Patient states that for the past 4 days she has had central chest pressure and pain along with exertional dyspnea.  She states that it feels like someone is "standing on my chest."  She states that when she tries to get up and walk the pressure and pain becomes so intense that she has to stop and rest which helps relieve her pain.  She states that she can only walk several feet before the pain and shortness of breath become too intense that she has to rest.  Her sister who is a geriatric nurse practitioner told her that she might need to bump up her oxygen to she went up to 5 years of O2 which did not improve her symptoms.  She did not take aspirin or nitroglycerin.  She took her reflux medicine hoping that this would improve her symptoms but did not help.  She denies nausea vomiting diaphoresis.  The pain does not radiate.  She had a normal nuclear stress test in 2017 and an echocardiogram that showed grade 1 diastolic heart failure.  Her last heart catheterization was in 2001.  Patient has noticed some mild swelling in her lower extremities but denies unilateral leg pain.  He does not take blood thinners.  She denies hemoptysis.   HPI: A 63 year old patient with a history of hypertension, hypercholesterolemia and obesity presents for  evaluation of chest pain. Initial onset of pain was more than 6 hours ago. The patient's chest pain is described as heaviness/pressure/tightness and is worse with exertion. The patient's chest pain is middle- or left-sided, is not well-localized, is not sharp and does not radiate to the arms/jaw/neck. The patient does not complain of nausea and denies diaphoresis. The patient has no history of stroke, has no history of peripheral artery disease, has not smoked in the past 90 days, denies any history of treated diabetes and has no relevant family history of coronary artery disease (first degree relative at less than age 82).   HPI  Past Medical History:  Diagnosis Date  . Allergy   . Anxiety   . Arthritis   . Asthma   . Bipolar 1 disorder ([place])   . CAD (coronary artery disease)   . Cellulitis   . CHF (congestive heart failure) (HCC)    diastolic dysfunction  . Chronic back pain   . Chronic headaches   . Complication of anesthesia    States she typically gets sick s/p anesthesia  . Contusion of sacrum   . COPD (chronic obstructive pulmonary disease) ([place])   . Depression   . Dyspnea    PFT 03/05/09 FEV1 2.77(98%), FVC 3.25(86%), FEV1% 85, TLC 5.88(99%), DLCO 60% ,  Methacholine challenge 03/16/09 normal ,  CT chest 03/12/09 no pulmonary disease  . Fungal infection   . GERD (gastroesophageal reflux disease)   . History of colonoscopy 10/17/2002  by Dr Laural Golden distal non-specific proctitis, small ext hemorrhoids,   . HTN (hypertension)   . Hyperlipidemia   . Hyperthyroidism   . Hypothyroidism    States she only has hyperthyroidism  . Migraine headache   . Morbid obesity with body mass index of 50.0-59.9 in adult [facility]) JAN 2011 370 LBS   2004 311 BMI 45.9  . Myocardial infarction ([place])    NOV 1997  . OSA on CPAP    she had been on 2L O2 at night but that was stopped  . PONV (postoperative nausea and vomiting)   . PTSD (post-traumatic stress disorder)   . Suicidal ideation   .  Urine incontinence   . Vitamin D deficiency     Patient Active Problem List   Diagnosis Date Noted  . Chest pain 08/06/2018  . Prolonged QT interval 08/06/2018  . Chronic obstructive pulmonary disease ([place]) 05/06/2018  . Orthostatic hypotension 10/24/2017  . Fatty liver 10/24/2017  . Thrombocytopenia ([place]) 09/29/2017  . Visual hallucinations   . Auditory hallucination   . Acute encephalopathy 09/26/2017  . Lower urinary tract infectious disease 09/26/2017  . Bipolar I disorder, most recent episode depressed, severe w psychosis ([place]) 06/21/2016  . History of MI (myocardial infarction) 06/21/2016  . Spinal stenosis of lumbar region with neurogenic claudication 03/29/2016  . Severe obesity (BMI >= 40) ([place]) 07/05/2015  . Hyperlipidemia LDL goal <130 07/05/2015  . MDD (major depressive disorder), single episode, severe with psychotic features ([place]) 01/21/2015  . 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 Bilateral knee pain 01/07/2014  . Scar condition and fibrosis of skin 12/11/2013  . Essential hypertension, benign 07/01/2013  . Asthma with acute exacerbation 11/04/2012  . OSA on CPAP 11/04/2012  . Back pain, chronic 08/27/2012  . Vitamin D insufficiency 04/04/2012  . Insomnia due to mental disorder 04/04/2012  . Anxiety 04/04/2012  . OCD (obsessive compulsive disorder) 04/04/2012  . PTSD (post-traumatic stress disorder) 06/28/2011  . Coronary artery disease 08/26/2010  . FATTY LIVER DISEASE 03/29/2009  . GERD 03/01/2009  . Morbid obesity ([place]) 02/15/2009  . Hypothyroidism 02/12/2009  . HYPERCHOLESTEROLEMIA 02/12/2009    Past Surgical History:  Procedure Laterality Date  . ABDOMINAL HYSTERECTOMY     sept 1996  . APPENDECTOMY    . BACK SURGERY  2008  . CARDIAC CATHETERIZATION     nov 1997  . CHOLECYSTECTOMY    . COLONOSCOPY  10/17/2002    Distal proctitis, small external hemorrhoids, otherwise/  normal colonoscopy. Suspect rectal bleeding secondary to hemorrhoids  .  ESOPHAGOGASTRODUODENOSCOPY  03/18/09   fundic gland polyps/mild gastritis  . HERNIA REPAIR  1978  . JOINT REPLACEMENT     bil knee replacement  . KNEE ARTHROSCOPY    . MULTIPLE EXTRACTIONS WITH ALVEOLOPLASTY N/A 08/16/2015   Procedure: EXTRACTION OF TEETH THREE, SIX, EIGHT, NINE, ELEVEN, FOURTEEN, FIFTEEN, TWENTY-EIGHT WITH ALVEOLOPLASTY;  Surgeon: Diona Browner, DDS;  Location: [facility];  Service: Oral Surgery;  Laterality: N/A;  . TONSILLECTOMY    . TOTAL VAGINAL HYSTERECTOMY    . TUBAL LIGATION       OB History   No obstetric history on file.      Home Medications    Prior to Admission medications   Medication Sig Start Date End Date Taking? Authorizing Provider  albuterol (PROAIR HFA) 108 (90 Base) MCG/ACT inhaler USE 2 PUFF EVERY 4 HOURS AS NEEDED FOR WHEEZING OR SHORTNESS OF BREATH 05/03/18  Yes Terald Sleeper, PA-C  atorvastatin (LIPITOR) 20 MG tablet TAKE  1 TABLET DAILY Patient taking differently: Take 20 mg by mouth daily.  07/26/18  Yes Dettinger, Fransisca Kaufmann, MD  benztropine (COGENTIN) 1 MG tablet Take 1 tablet (1 mg total) by mouth at bedtime. 05/14/18 05/14/19 Yes Arfeen, Arlyce Harman, MD  BREO ELLIPTA 100-25 MCG/INH AEPB INHALE 1 PUFF INTO THE LUNGS DAILY 07/24/16  Yes Dettinger, Fransisca Kaufmann, MD  celecoxib (CELEBREX) 200 MG capsule TAKE (1) CAPSULE TWICE DAILY. Patient taking differently: Take 200 mg by mouth 2 (two) times daily. TAKE  (1)  CAPSULE  TWICE DAILY. 06/28/18  Yes Dettinger, Fransisca Kaufmann, MD  cetirizine (ZYRTEC) 10 MG tablet TAKE 1 TABLET ONCE DAILY 03/14/18  Yes Dettinger, Fransisca Kaufmann, MD  cloNIDine (CATAPRES) 0.1 MG tablet TAKE ONE TABLET AT BEDTIME 05/07/18  Yes Dettinger, Fransisca Kaufmann, MD  diclofenac sodium (VOLTAREN) 1 % GEL Apply to affected area twice daily as needed Patient taking differently: Apply 2 g topically 2 (two) times daily as needed (PAIN).  03/15/16  Yes Dettinger, Fransisca Kaufmann, MD  divalproex (DEPAKOTE) 500 MG DR tablet Take 3 tablets (1,500 mg total) by mouth at bedtime.  05/14/18  Yes Arfeen, Arlyce Harman, MD  DULoxetine (CYMBALTA) 60 MG capsule Take 1 capsule (60 mg total) by mouth daily. 05/14/18  Yes Arfeen, Arlyce Harman, MD  fluticasone (FLONASE) 50 MCG/ACT nasal spray Place 1 spray 2 (two) times daily as needed into both nostrils for allergies or rhinitis. 04/16/17  Yes Terald Sleeper, PA-C  levothyroxine (SYNTHROID, LEVOTHROID) 125 MCG tablet TAKE (1) TABLET DAILY BE- FORE BREAKFAST. Patient taking differently: Take 125 mcg by mouth daily before breakfast.  11/21/17  Yes Dettinger, Fransisca Kaufmann, MD  nystatin cream (MYCOSTATIN) APPLY TO THE AFFECTED AREA THREE TIMES DAILY AS NEEDED .Marland KitchenFOR TOPICAL USE ONLY... Patient taking differently: Apply 1 application topically 3 (three) times daily as needed for dry skin.  02/20/17  Yes Dettinger, Fransisca Kaufmann, MD  omeprazole (PRILOSEC) 20 MG capsule TAKE (1) CAPSULE DAILY Patient taking differently: Take 20 mg by mouth daily.  07/15/18  Yes Dettinger, Fransisca Kaufmann, MD  ondansetron (ZOFRAN-ODT) 4 MG disintegrating tablet DISSOLVE 1 TABLET IN MOUTH EVERY 8 HOURS AS NEEDED FOR NAUSEA AND VOMITING Patient taking differently: Take 4 mg by mouth every 8 (eight) hours as needed for nausea or vomiting.  01/02/18  Yes Dettinger, Fransisca Kaufmann, MD  polyethylene glycol powder (GLYCOLAX/MIRALAX) powder Take 17 g by mouth 2 (two) times daily as needed. Patient taking differently: Take 17 g by mouth 2 (two) times daily as needed for moderate constipation.  06/07/17  Yes Dettinger, Fransisca Kaufmann, MD  promethazine (PHENERGAN) 25 MG tablet Take 1 tablet (25 mg total) by mouth every 8 (eight) hours as needed for nausea or vomiting. 07/05/17  Yes Dettinger, Fransisca Kaufmann, MD  risperiDONE (RISPERDAL) 2 MG tablet Take 1 tablet (2 mg total) by mouth daily as needed. For auditory hallucintations 02/06/18  Yes Lavella Hammock, MD  risperidone (RISPERDAL) 4 MG tablet Take 1 tablet (4 mg total) by mouth at bedtime. 05/14/18 05/14/19 Yes Arfeen, Arlyce Harman, MD  therapeutic multivitamin-minerals  [facility]) tablet Take 1 tablet by mouth daily.   Yes [provider]  traMADol (ULTRAM) 50 MG tablet Take 1 tablet (50 mg total) by mouth every 8 (eight) hours as needed for moderate pain. 06/28/18  Yes Dettinger, Fransisca Kaufmann, MD  albuterol (PROVENTIL) (2.5 MG/3ML) 0.083% nebulizer solution Take 3 mLs (2.5 mg total) by nebulization every 4 (four) hours as needed for wheezing or shortness of breath. 05/03/18  Terald Sleeper, PA-C  cephALEXin (KEFLEX) 500 MG capsule Take 1 capsule (500 mg total) by mouth 4 (four) times daily. Patient not taking: Reported on 08/06/2018 06/28/18   Dettinger, Fransisca Kaufmann, MD    Family History Family History  Problem Relation Age of Onset  . Hypertension Mother   . Bipolar disorder Mother   . Dementia Mother   . Depression Mother   . Heart attack Mother   . AAA (abdominal aortic aneurysm) Mother   . Coronary artery disease Father   . Alcohol abuse Father   . Hypertension Brother   . Coronary artery disease Brother   . Bipolar disorder Brother   . Depression Brother   . Depression Sister   . Paranoid behavior Sister   . Cancer Sister        breast  . Bipolar disorder Sister   . Depression Sister   . Hypertension Sister   . Cancer Son        thyroid  . Cancer Maternal Aunt        breast metastatized to brain  . Anesthesia problems Neg Hx   . Hypotension Neg Hx   . Malignant hyperthermia Neg Hx   . Pseudochol deficiency Neg Hx     Social History Social History   Tobacco Use  . Smoking status: Never Smoker  . Smokeless tobacco: Never Used  Substance Use Topics  . Alcohol use: No    Alcohol/week: 0.0 standard drinks  . Drug use: No     Allergies   Abilify [aripiprazole]; Naproxen; Ativan [lorazepam]; Haldol [haloperidol]; and Hydroxyzine   Review of Systems Review of Systems  Ten systems reviewed and are negative for acute change, except as noted in the HPI.   Physical Exam Updated Vital Signs BP 123/78   Pulse 74   Temp 97.7  F (36.5 C) (Oral)   Resp 17   Ht 5\' 9"  (1.753 m)   Wt (!) 171.6 kg   LMP 02/18/1995   SpO2 99%   BMI 55.87 kg/m   Physical Exam Vitals signs and nursing note reviewed.  Constitutional:      General: She is not in acute distress.    Appearance: She is well-developed. She is not diaphoretic.  HENT:     Head: Normocephalic and atraumatic.  Eyes:     General: No scleral icterus.    Conjunctiva/sclera: Conjunctivae normal.  Neck:     Musculoskeletal: Normal range of motion.  Cardiovascular:     Rate and Rhythm: Normal rate and regular rhythm.     Heart sounds: Normal heart sounds. No murmur. No friction rub. No gallop.   Pulmonary:     Effort: Pulmonary effort is normal. No tachypnea or respiratory distress.     Breath sounds: Normal breath sounds. No wheezing or rhonchi.  Abdominal:     General: Bowel sounds are normal. There is no distension.     Palpations: Abdomen is soft. There is no mass.     Tenderness: There is no abdominal tenderness. There is no guarding.  Musculoskeletal:     Right lower leg: No edema.     Left lower leg: No edema.  Skin:    General: Skin is warm and dry.  Neurological:     Mental Status: She is alert and oriented to person, place, and time.  Psychiatric:        Behavior: Behavior normal.      ED Treatments / Results  Labs (all labs ordered are listed, but  only abnormal results are displayed) Labs Reviewed  CBC - Abnormal; Notable for the following components:      Result Value   Platelets 98 (*)    All other components within normal limits  COMPREHENSIVE METABOLIC PANEL - Abnormal; Notable for the following components:   Glucose, Bld 105 (*)    Total Protein 6.2 (*)    All other components within normal limits  BRAIN NATRIURETIC PEPTIDE  PROTIME-INR  APTT  MAGNESIUM  TROPONIN I  HEPARIN LEVEL (UNFRACTIONATED)  CBC  TROPONIN I  I-STAT TROPONIN, ED  CBG MONITORING, ED    EKG EKG Interpretation  Date/Time:  Tuesday August 06 2018 14:00:19 EDT Ventricular Rate:  98 PR Interval:    QRS Duration: 68 QT Interval:  395 QTC Calculation: 502 R Axis:   23 Text Interpretation:  Sinus rhythm Ventricular premature complex Borderline repolarization abnormality Prolonged QT interval similar to previous Confirmed by Theotis Burrow [phone]) on 08/06/2018 2:41:33 PM   Radiology Dg Chest 2 View  Result Date: 08/06/2018 CLINICAL DATA:  Acute chest pain for 1 week. EXAM: CHEST - 2 VIEW COMPARISON:  10/22/2017 and prior radiographs FINDINGS: The cardiomediastinal silhouette is unremarkable. Possible trace effusions on the LATERAL view noted. There is no evidence of focal airspace disease, pulmonary edema, suspicious pulmonary nodule/mass, or pneumothorax. No acute bony abnormalities are identified. IMPRESSION: Possible trace bilateral pleural effusions without other acute abnormality. Electronically Signed   By: Margarette Canada M.D.   On: 08/06/2018 16:38    Procedures .Critical Care Performed by: Margarita Mail, PA-C Authorized by: Margarita Mail, PA-C   Critical care provider statement:    Critical care time (minutes):  45   Critical care was necessary to treat or prevent imminent or life-threatening deterioration of the following conditions:  Cardiac failure   Critical care was time spent personally by me on the following activities:  Discussions with consultants, evaluation of patient's response to treatment, examination of patient, ordering and performing treatments and interventions, ordering and review of laboratory studies, ordering and review of radiographic studies, pulse oximetry, re-evaluation of patient's condition, obtaining history from patient or surrogate and review of old charts   (including critical care time)  Medications Ordered in ED Medications  nitroGLYCERIN (NITROSTAT) SL tablet 0.4 mg (0.4 mg Sublingual Given 08/06/18 2054)  heparin ADULT infusion 100 units/mL (25000 units/269mL sodium chloride 0.45%)  (1,600 Units/hr Intravenous New Bag/Given 08/06/18 1648)  albuterol (PROVENTIL) (2.5 MG/3ML) 0.083% nebulizer solution 2.5 mg (has no administration in time range)  atorvastatin (LIPITOR) tablet 20 mg (has no administration in time range)  benztropine (COGENTIN) tablet 1 mg (1 mg Oral Given 08/06/18 2136)  celecoxib (CELEBREX) capsule 200 mg (200 mg Oral Given 08/06/18 2136)  loratadine (CLARITIN) tablet 10 mg (has no administration in time range)  cloNIDine (CATAPRES) tablet 0.1 mg (0.1 mg Oral Given 08/06/18 2136)  divalproex (DEPAKOTE) DR tablet 1,500 mg (1,500 mg Oral Given 08/06/18 2136)  DULoxetine (CYMBALTA) DR capsule 60 mg (has no administration in time range)  levothyroxine (SYNTHROID, LEVOTHROID) tablet 125 mcg (has no administration in time range)  pantoprazole (PROTONIX) EC tablet 40 mg (has no administration in time range)  risperiDONE (RISPERDAL) tablet 2 mg (has no administration in time range)  promethazine (PHENERGAN) tablet 25 mg (has no administration in time range)  risperiDONE (RISPERDAL) tablet 4 mg (4 mg Oral Given 08/06/18 2135)  traMADol (ULTRAM) tablet 50 mg (has no administration in time range)  acetaminophen (TYLENOL) tablet 650 mg (650 mg Oral Given  08/06/18 2221)    Or  acetaminophen (TYLENOL) suppository 650 mg ( Rectal See Alternative 08/06/18 2221)  prochlorperazine (COMPAZINE) injection 10 mg (has no administration in time range)  feeding supplement (ENSURE ENLIVE) (ENSURE ENLIVE) liquid 237 mL (has no administration in time range)  polyethylene glycol (MIRALAX / GLYCOLAX) packet 17 g (has no administration in time range)  sodium chloride flush (NS) 0.9 % injection 3 mL (3 mLs Intravenous Given 08/06/18 1507)  aspirin chewable tablet 324 mg (324 mg Oral Given 08/06/18 1546)  heparin bolus via infusion 4,000 Units (4,000 Units Intravenous Bolus from Bag 08/06/18 1648)  magnesium sulfate IVPB 2 g 50 mL (2 g Intravenous New Bag/Given 08/06/18 1856)  potassium chloride SA  (K-DUR,KLOR-CON) CR tablet 40 mEq (40 mEq Oral Given 08/06/18 1842)  magnesium sulfate IVPB 2 g 50 mL (2 g Intravenous New Bag/Given 08/06/18 2151)     Initial Impression / Assessment and Plan / ED Course  I have reviewed the triage vital signs and the nursing notes.  Pertinent labs & imaging results that were available during my care of the patient were reviewed by me and considered in my medical decision making (see chart for details).  Clinical Course as of Aug 05 2317  Tue Aug 06, 2018  1731 Patients pain significantly improved after nitroglycerin.   [AH]    Clinical Course User Index [AH] Margarita Mail, PA-C    HEAR Score: 21  63 year old female with multiple risk factors for ACS who presents with severe chest pressure which is worsened with excessive exertion and associated dyspnea.  Last catheterization 10 years ago although she had a normal stress test 3 years ago.  Initial troponin negative.  Patient's white blood cell count is not elevated.  Her labs are otherwise generally unremarkable.  BNP is not elevated which may be falsely low secondary to the patient's body habitus.  Although she does have some trace pleural effusions on her chest x-ray which I personally reviewed, she does not appear clinically volume overloaded..  Patient EKG is unchanged from previous except for some QT prolongation which is likely secondary to her psychiatric medications.  I spoke with Dr. Debara Pickett who feels she can be admitted to the hospitalist service and observed with consult from cardiology.  I have concern for unstable angina as her primary diagnosis given her story.  Had significant improvement in pain sublingual nitroglycerin.  Dr. Olevia Bowens will admit the patient.   Final Clinical Impressions(s) / ED Diagnoses   Final diagnoses:  Unstable angina ([place])  Prolonged Q-T interval on ECG    ED Discharge Orders    None       Margarita Mail, PA-C 08/06/18 2319    Little, Wenda Overland,  MD 08/07/18 [phone]

## 2018-08-06 NOTE — Progress Notes (Signed)
ANTICOAGULATION CONSULT NOTE - Initial Consult  Pharmacy Consult for IV heparin Indication: chest pain/ACS  Allergies  Allergen Reactions  . Abilify [Aripiprazole] Other (See Comments)    hallucinations  . Naproxen Nausea And Vomiting and Swelling  . Ativan [Lorazepam] Other (See Comments)    Delirium  . Haldol [Haloperidol] Other (See Comments)    Hallucinating   . Hydroxyzine Other (See Comments)    hallucinations    Patient Measurements: Height: 5\' 9"  (175.3 cm) Weight: (!) 394 lb (178.7 kg) IBW/kg (Calculated) : 66.2 Heparin Dosing Weight: 111.5 kg   Vital Signs: Temp: 98.2 F (36.8 C) (03/10 1414) Temp Source: Oral (03/10 1414) BP: 142/90 (03/10 1414) Pulse Rate: 77 (03/10 1414)  Labs: No results for input(s): HGB, HCT, PLT, APTT, LABPROT, INR, HEPARINUNFRC, HEPRLOWMOCWT, CREATININE, CKTOTAL, CKMB, TROPONINI in the last 72 hours.  CrCl cannot be calculated (Patient's most recent lab result is older than the maximum 21 days allowed.).   Medical History: Past Medical History:  Diagnosis Date  . Allergy   . Anxiety   . Arthritis   . Asthma   . Bipolar 1 disorder ([place])   . CAD (coronary artery disease)   . Cellulitis   . CHF (congestive heart failure) ([facility])    diastolic dysfunction  . Chronic back pain   . Chronic headaches   . Complication of anesthesia    States she typically gets sick s/p anesthesia  . Contusion of sacrum   . COPD (chronic obstructive pulmonary disease) ([place])   . Depression   . Dyspnea    PFT 03/05/09 FEV1 2.77(98%), FVC 3.25(86%), FEV1% 85, TLC 5.88(99%), DLCO 60% ,  Methacholine challenge 03/16/09 normal ,  CT chest 03/12/09 no pulmonary disease  . Fungal infection   . GERD (gastroesophageal reflux disease)   . History of colonoscopy 10/17/2002   by Dr Rehman-> distal non-specific proctitis, small ext hemorrhoids,   . HTN (hypertension)   . Hyperlipidemia   . Hyperthyroidism   . Hypothyroidism    States she only has  hyperthyroidism  . Migraine headache   . Morbid obesity with body mass index of 50.0-59.9 in adult [facility]) JAN 2011 370 LBS   2004 311 BMI 45.9  . Myocardial infarction ([place])    NOV 1997  . OSA on CPAP    she had been on 2L O2 at night but that was stopped  . PONV (postoperative nausea and vomiting)   . PTSD (post-traumatic stress disorder)   . Suicidal ideation   . Urine incontinence   . Vitamin D deficiency     Medications:  (Not in a hospital admission)   Assessment: Pharmacy is consulted to dose heparin in 63 yo female diagnosed with ACS. No noted blood thinners on pt med rec profile. PMH includes anxiety, arthritis, asthma, bipolar 1 disorder, CAD ,Cellulitis, CHF, chronic back pain, chronic headaches,  COPD with chronic hypoxic respiratory failure on continuous 4 L 02. Depression GERD, HTN, Hyperlipidemia, Hyperthyroidism, Hypothyroidism, Migraine headache, Morbid obesity, Myocardial infarction, OSA on CPAP, PTSD, Suicidal ideation, Urine incontinence, and Vitamin D deficiency. She had a normal nuclear stress test in 2017 and an echocardiogram that showed grade 1 diastolic heart failure.  Her last heart catheterization was in 2001.      Baseline labs, 08/06/18   PT 12.5, INR 0.9  aPTT 28   Hgb 12.6, HCT 39  SCr 0.90 mg/dl   Goal of Therapy:  Heparin level 0.3-0.7 units/ml Monitor platelets by anticoagulation protocol: Yes   Plan:  Heparin 4000 units IV x1, then 1600 units/hr   Heparin level 6 hours after start  Daily CBC while on heparin   Monitor for signs and symptoms of bleeding   Royetta Asal, PharmD, BCPS Pager [phone] 08/06/2018 3:36 PM

## 2018-08-06 NOTE — ED Notes (Signed)
Pt's sister came out angry that patient did not have a diet order in and had not been given food. RN explained that the cardiologist had not put in a diet order, therefore we could not give patient any food.  Pt's sister left angrily, but patient is understanding at this time

## 2018-08-06 NOTE — H&P (Signed)
4        History and Physical    Allison Sharp:[id number] DOB: February 21, 1956 DOA: 08/06/2018  PCP: Dettinger, Fransisca Kaufmann, MD   Patient coming from: Home.  I have personally briefly reviewed patient's old medical records in [place]  Chief Complaint: Chest pain.  HPI: Allison Sharp is a 63 y.o. female with medical history significant of seasonal allergies, anxiety, osteoarthritis, asthma/COPD, bipolar 1 disorder, CAD, history of MI, diastolic CHF, chronic back pain, chronic headaches, contusion of sacrum history, depression history of fungal infection, GERD, hypertension, hyperlipidemia, hypothyroidism/hypothyroidism OSA on CPAP, PTSD, history of suicidal ideation, history of urinary incontinence, vitamin D deficiency who is coming to the emergency department with complaints of chest pain for the past 4 days.  She states that for the past few months she has been getting mild chest pressure while exerting and they improve after rest.  However, she states for the last 4 days the pain has become more intense.  It is pressure-like, radiated to her left cervical area associated with dyspnea, palpitations, mild nausea and diaphoresis.  It worsens with exertion.  The pain is relieved with rest and oxygen.  Lately she has been using an increased oxygen requirement after she spoke to her sister who is a Designer, jewellery.  However, this did not improve her symptoms significantly.  She denies PND, orthopnea, pitting edema of the lower extremities, but has chronic lymphedema.  She denies fever, chills sore throat, rhinorrhea, abdominal pain, diarrhea, melena or hematochezia.  She occasionally gets constipated.  No dysuria, frequency or hematuria.  No polyuria, polydipsia, polyphagia or blurred vision.  ED Course: Initial vital signs temperature 98.2 F, pulse 77, respirations 16, blood pressure 142/90 mmHg and O2 sat 100% on nasal cannula oxygen.  She received aspirin and sublingual nitroglycerin in  the emergency department, which relieved her pain significantly.  She was started on heparin infusion.  The case was discussed with cardiology on-call (Dr. Debara Pickett) who recommended admission to the hospitalist service and cardiology consultation in a.m.  CBC showed platelets of 98, but was otherwise normal with a white count of 6.2 and hemoglobin of 12.6 g/dL.  PT, PTT and INR within normal limits.  CMP shows a glucose of 105 mg/dL and total protein of 6.2 g/dL.  Other values are within normal limits.  BNP was 23.9 pg/mL.  Troponin x2 so far has been negative.  EKG shows sinus rhythm with VPC, borderline repolarization abnormality and prolonged QT interval.Her two-view chest radiograph showed possible trace bilateral pleural effusions without any other acute abnormality.  Please see images and full radiology report for further detail.  Review of Systems: As per HPI otherwise 10 point review of systems negative.   Past Medical History:  Diagnosis Date  . Allergy   . Anxiety   . Arthritis   . Asthma   . Bipolar 1 disorder ([place])   . CAD (coronary artery disease)   . Cellulitis   . CHF (congestive heart failure) (HCC)    diastolic dysfunction  . Chronic back pain   . Chronic headaches   . Complication of anesthesia    States she typically gets sick s/p anesthesia  . Contusion of sacrum   . COPD (chronic obstructive pulmonary disease) ([place])   . Depression   . Dyspnea    PFT 03/05/09 FEV1 2.77(98%), FVC 3.25(86%), FEV1% 85, TLC 5.88(99%), DLCO 60% ,  Methacholine challenge 03/16/09 normal ,  CT chest 03/12/09 no pulmonary disease  .  Fungal infection   . GERD (gastroesophageal reflux disease)   . History of colonoscopy 10/17/2002   by Dr Rehman-> distal non-specific proctitis, small ext hemorrhoids,   . HTN (hypertension)   . Hyperlipidemia   . Hyperthyroidism   . Hypothyroidism    States she only has hyperthyroidism  . Migraine headache   . Morbid obesity with body mass index of 50.0-59.9  in adult [facility]) JAN 2011 370 LBS   2004 311 BMI 45.9  . Myocardial infarction ([place])    NOV 1997  . OSA on CPAP    she had been on 2L O2 at night but that was stopped  . PONV (postoperative nausea and vomiting)   . PTSD (post-traumatic stress disorder)   . Suicidal ideation   . Urine incontinence   . Vitamin D deficiency     Past Surgical History:  Procedure Laterality Date  . ABDOMINAL HYSTERECTOMY     sept 1996  . APPENDECTOMY    . BACK SURGERY  2008  . CARDIAC CATHETERIZATION     nov 1997  . CHOLECYSTECTOMY    . COLONOSCOPY  10/17/2002    Distal proctitis, small external hemorrhoids, otherwise/  normal colonoscopy. Suspect rectal bleeding secondary to hemorrhoids  . ESOPHAGOGASTRODUODENOSCOPY  03/18/09   fundic gland polyps/mild gastritis  . HERNIA REPAIR  1978  . JOINT REPLACEMENT     bil knee replacement  . KNEE ARTHROSCOPY    . MULTIPLE EXTRACTIONS WITH ALVEOLOPLASTY N/A 08/16/2015   Procedure: EXTRACTION OF TEETH THREE, SIX, EIGHT, NINE, ELEVEN, FOURTEEN, FIFTEEN, TWENTY-EIGHT WITH ALVEOLOPLASTY;  Surgeon: Diona Browner, DDS;  Location: [facility];  Service: Oral Surgery;  Laterality: N/A;  . TONSILLECTOMY    . TOTAL VAGINAL HYSTERECTOMY    . TUBAL LIGATION       reports that she has never smoked. She has never used smokeless tobacco. She reports that she does not drink alcohol or use drugs.  Allergies  Allergen Reactions  . Abilify [Aripiprazole] Other (See Comments)    hallucinations  . Naproxen Nausea And Vomiting and Swelling  . Ativan [Lorazepam] Other (See Comments)    Delirium  . Haldol [Haloperidol] Other (See Comments)    Hallucinating   . Hydroxyzine Other (See Comments)    hallucinations    Family History  Problem Relation Age of Onset  . Hypertension Mother   . Bipolar disorder Mother   . Dementia Mother   . Depression Mother   . Heart attack Mother   . AAA (abdominal aortic aneurysm) Mother   . Coronary artery disease Father   . Alcohol abuse  Father   . Hypertension Brother   . Coronary artery disease Brother   . Bipolar disorder Brother   . Depression Brother   . Depression Sister   . Paranoid behavior Sister   . Cancer Sister        breast  . Bipolar disorder Sister   . Depression Sister   . Hypertension Sister   . Cancer Son        thyroid  . Cancer Maternal Aunt        breast metastatized to brain  . Anesthesia problems Neg Hx   . Hypotension Neg Hx   . Malignant hyperthermia Neg Hx   . Pseudochol deficiency Neg Hx    Prior to Admission medications   Medication Sig Start Date End Date Taking? Authorizing Provider  albuterol (PROAIR HFA) 108 (90 Base) MCG/ACT inhaler USE 2 PUFF EVERY 4 HOURS AS NEEDED FOR WHEEZING  OR SHORTNESS OF BREATH 05/03/18  Yes Terald Sleeper, PA-C  atorvastatin (LIPITOR) 20 MG tablet TAKE 1 TABLET DAILY Patient taking differently: Take 20 mg by mouth daily.  07/26/18  Yes Dettinger, Fransisca Kaufmann, MD  benztropine (COGENTIN) 1 MG tablet Take 1 tablet (1 mg total) by mouth at bedtime. 05/14/18 05/14/19 Yes Arfeen, Arlyce Harman, MD  BREO ELLIPTA 100-25 MCG/INH AEPB INHALE 1 PUFF INTO THE LUNGS DAILY 07/24/16  Yes Dettinger, Fransisca Kaufmann, MD  celecoxib (CELEBREX) 200 MG capsule TAKE (1) CAPSULE TWICE DAILY. Patient taking differently: Take 200 mg by mouth 2 (two) times daily. TAKE  (1)  CAPSULE  TWICE DAILY. 06/28/18  Yes Dettinger, Fransisca Kaufmann, MD  cetirizine (ZYRTEC) 10 MG tablet TAKE 1 TABLET ONCE DAILY 03/14/18  Yes Dettinger, Fransisca Kaufmann, MD  cloNIDine (CATAPRES) 0.1 MG tablet TAKE ONE TABLET AT BEDTIME 05/07/18  Yes Dettinger, Fransisca Kaufmann, MD  diclofenac sodium (VOLTAREN) 1 % GEL Apply to affected area twice daily as needed Patient taking differently: Apply 2 g topically 2 (two) times daily as needed (PAIN).  03/15/16  Yes Dettinger, Fransisca Kaufmann, MD  divalproex (DEPAKOTE) 500 MG DR tablet Take 3 tablets (1,500 mg total) by mouth at bedtime. 05/14/18  Yes Arfeen, Arlyce Harman, MD  DULoxetine (CYMBALTA) 60 MG capsule Take 1  capsule (60 mg total) by mouth daily. 05/14/18  Yes Arfeen, Arlyce Harman, MD  fluticasone (FLONASE) 50 MCG/ACT nasal spray Place 1 spray 2 (two) times daily as needed into both nostrils for allergies or rhinitis. 04/16/17  Yes Terald Sleeper, PA-C  levothyroxine (SYNTHROID, LEVOTHROID) 125 MCG tablet TAKE (1) TABLET DAILY BE- FORE BREAKFAST. Patient taking differently: Take 125 mcg by mouth daily before breakfast.  11/21/17  Yes Dettinger, Fransisca Kaufmann, MD  nystatin cream (MYCOSTATIN) APPLY TO THE AFFECTED AREA THREE TIMES DAILY AS NEEDED .Marland KitchenFOR TOPICAL USE ONLY... Patient taking differently: Apply 1 application topically 3 (three) times daily as needed for dry skin.  02/20/17  Yes Dettinger, Fransisca Kaufmann, MD  omeprazole (PRILOSEC) 20 MG capsule TAKE (1) CAPSULE DAILY Patient taking differently: Take 20 mg by mouth daily.  07/15/18  Yes Dettinger, Fransisca Kaufmann, MD  ondansetron (ZOFRAN-ODT) 4 MG disintegrating tablet DISSOLVE 1 TABLET IN MOUTH EVERY 8 HOURS AS NEEDED FOR NAUSEA AND VOMITING Patient taking differently: Take 4 mg by mouth every 8 (eight) hours as needed for nausea or vomiting.  01/02/18  Yes Dettinger, Fransisca Kaufmann, MD  polyethylene glycol powder (GLYCOLAX/MIRALAX) powder Take 17 g by mouth 2 (two) times daily as needed. Patient taking differently: Take 17 g by mouth 2 (two) times daily as needed for moderate constipation.  06/07/17  Yes Dettinger, Fransisca Kaufmann, MD  promethazine (PHENERGAN) 25 MG tablet Take 1 tablet (25 mg total) by mouth every 8 (eight) hours as needed for nausea or vomiting. 07/05/17  Yes Dettinger, Fransisca Kaufmann, MD  risperiDONE (RISPERDAL) 2 MG tablet Take 1 tablet (2 mg total) by mouth daily as needed. For auditory hallucintations Patient taking differently: Take 2 mg by mouth at bedtime. For auditory hallucintations 02/06/18  Yes Lavella Hammock, MD  risperidone (RISPERDAL) 4 MG tablet Take 1 tablet (4 mg total) by mouth at bedtime. 05/14/18 05/14/19 Yes Arfeen, Arlyce Harman, MD  therapeutic  multivitamin-minerals [facility]) tablet Take 1 tablet by mouth daily.   Yes [provider]  traMADol (ULTRAM) 50 MG tablet Take 1 tablet (50 mg total) by mouth every 8 (eight) hours as needed for moderate pain. 06/28/18  Yes Dettinger, Vonna Kotyk  A, MD  albuterol (PROVENTIL) (2.5 MG/3ML) 0.083% nebulizer solution Take 3 mLs (2.5 mg total) by nebulization every 4 (four) hours as needed for wheezing or shortness of breath. 05/03/18   Terald Sleeper, PA-C  cephALEXin (KEFLEX) 500 MG capsule Take 1 capsule (500 mg total) by mouth 4 (four) times daily. Patient not taking: Reported on 08/06/2018 06/28/18   Dettinger, Fransisca Kaufmann, MD    Physical Exam: Vitals:   08/06/18 1700 08/06/18 1830 08/06/18 1930 08/06/18 2030  BP: 126/86 136/85 (!) 151/92 127/80  Pulse: 89 82 82 76  Resp: 16 15 10 16   Temp:    97.7 F (36.5 C)  TempSrc:    Oral  SpO2: 95% 100% 100% 100%  Weight:    (!) 171.6 kg  Height:    5\' 9"  (1.753 m)    Constitutional: NAD, calm, comfortable Eyes: PERRL, lids and conjunctivae normal ENMT: Mucous membranes are moist. Posterior pharynx clear of any exudate or lesions. Neck: normal, supple, no masses, no thyromegaly Respiratory: Decreased breath sounds on bases, but otherwise clear to auscultation bilaterally, no wheezing, no crackles. Normal respiratory effort. No accessory muscle use.  Cardiovascular: Regular rate and rhythm, no murmurs / rubs / gallops. No extremity edema. 2+ pedal pulses. No carotid bruits.  Abdomen: Obese, soft, no tenderness, no masses palpated. No hepatosplenomegaly. Bowel sounds positive.  Musculoskeletal: no clubbing / cyanosis.  Good ROM, no contractures. Normal muscle tone.  Skin: no rashes, lesions, ulcers on very limited examination. Neurologic: CN 2-12 grossly intact. Sensation intact, DTR normal. Strength 5/5 in all 4.  Psychiatric: Normal judgment and insight. Alert and oriented x 3. Normal mood.   Labs on Admission: I have personally reviewed  following labs and imaging studies  CBC: Recent Labs  Lab 08/06/18 1505  WBC 6.2  HGB 12.6  HCT 39.0  MCV 96.5  PLT 98*   Basic Metabolic Panel: Recent Labs  Lab 08/06/18 1504  NA 140  K 3.8  CL 105  CO2 28  GLUCOSE 105*  BUN 12  CREATININE 0.90  CALCIUM 9.1  MG 2.1   GFR: Estimated Creatinine Clearance: 108.1 mL/min (by C-G formula based on SCr of 0.9 mg/dL). Liver Function Tests: Recent Labs  Lab 08/06/18 1504  AST 19  ALT 12  ALKPHOS 61  BILITOT 0.7  PROT 6.2*  ALBUMIN 3.6   No results for input(s): LIPASE, AMYLASE in the last 168 hours. No results for input(s): AMMONIA in the last 168 hours. Coagulation Profile: Recent Labs  Lab 08/06/18 1504  INR 0.9   Cardiac Enzymes: No results for input(s): CKTOTAL, CKMB, CKMBINDEX, TROPONINI in the last 168 hours. BNP (last 3 results) No results for input(s): PROBNP in the last 8760 hours. HbA1C: No results for input(s): HGBA1C in the last 72 hours. CBG: Recent Labs  Lab 08/06/18 1616  GLUCAP 82   Lipid Profile: No results for input(s): CHOL, HDL, LDLCALC, TRIG, CHOLHDL, LDLDIRECT in the last 72 hours. Thyroid Function Tests: No results for input(s): TSH, T4TOTAL, FREET4, T3FREE, THYROIDAB in the last 72 hours. Anemia Panel: No results for input(s): VITAMINB12, FOLATE, FERRITIN, TIBC, IRON, RETICCTPCT in the last 72 hours. Urine analysis:    Component Value Date/Time   COLORURINE YELLOW 01/25/2018 1217   APPEARANCEUR Clear 06/28/2018 0919   LABSPEC 1.017 01/25/2018 1217   PHURINE 6.0 01/25/2018 1217   GLUCOSEU Negative 06/28/2018 0919   GLUCOSEU NEGATIVE 07/01/2013 1119   HGBUR NEGATIVE 01/25/2018 1217   BILIRUBINUR Negative 06/28/2018 0919  [place] NEGATIVE 01/25/2018 1217   PROTEINUR Negative 06/28/2018 0919   PROTEINUR NEGATIVE 01/25/2018 1217   UROBILINOGEN 1.0 01/16/2015 1854   NITRITE Negative 06/28/2018 0919   NITRITE NEGATIVE 01/25/2018 1217   LEUKOCYTESUR Negative 06/28/2018 0919    LEUKOCYTESUR Small 07/01/2013 1647    Radiological Exams on Admission: Dg Chest 2 View  Result Date: 08/06/2018 CLINICAL DATA:  Acute chest pain for 1 week. EXAM: CHEST - 2 VIEW COMPARISON:  10/22/2017 and prior radiographs FINDINGS: The cardiomediastinal silhouette is unremarkable. Possible trace effusions on the LATERAL view noted. There is no evidence of focal airspace disease, pulmonary edema, suspicious pulmonary nodule/mass, or pneumothorax. No acute bony abnormalities are identified. IMPRESSION: Possible trace bilateral pleural effusions without other acute abnormality. Electronically Signed   By: Margarette Canada M.D.   On: 08/06/2018 16:38   04/03/2016 echocardiogram ------------------------------------------------------------------- LV EF: 55% -   60%  ------------------------------------------------------------------- Indications:      (R06.02).  ------------------------------------------------------------------- History:   PMH:  Acquired from the patient and from the patient&'s chart.  Dyspnea.  Coronary artery disease.  Risk factors: Hypertension. Morbidly obese. Dyslipidemia.  ------------------------------------------------------------------- Study Conclusions  - Left ventricle: The cavity size was normal. Wall thickness was   increased in a pattern of mild LVH. Systolic function was normal.   The estimated ejection fraction was in the range of 55% to 60%.   Although no diagnostic regional wall motion abnormality was   identified, this possibility cannot be completely excluded on the   basis of this study. Doppler parameters are consistent with   abnormal left ventricular relaxation (grade 1 diastolic   dysfunction). - Left atrium: The atrium was mildly dilated.  04/03/2016 stress test   Nuclear stress EF: 63%. No wall motion abnormalities  Defect 1: There is a medium defect of mild severity present in the basal inferolateral, mid inferolateral and apex  location. This defect is likely secondary to bowel loop attenuation seen on the raw images.  There was no ST segment deviation noted during stress.  This is a low risk study. No significant ischemia identified.  EKG: Independently reviewed.  Vent. rate 98 BPM PR interval * ms QRS duration 68 ms QT/QTc 395/502 ms P-R-T axes 72 23 * Sinus rhythm Ventricular premature complex Borderline repolarization abnormality Prolonged QT interval  Assessment/Plan Principal Problem:   Chest pain   Coronary artery disease Observation/telemetry. Continue supplemental oxygen. Continue heparin infusion. Nitroglycerin sublingually as needed. Analgesics as needed. And troponin level. Check EKG in a.m. Check echocardiogram. Left message for cardiology consult.  Active Problems:   Prolonged QT interval Magnesium sulfate 2 g IVPB. Avoid medications that prolong QT interval if possible.    Hypothyroidism Continue levothyroxine 125 mcg p.o. daily. Monitor TSH as needed.    Morbid obesity ([place]) Needs significant lifestyle modifications.    GERD Pantoprazole 40 mg p.o. daily.    OSA on CPAP Continue CPAP at bedtime.    Essential hypertension, benign Continue clonidine 0.1 mg p.o. at bedtime.    Hyperlipidemia LDL goal <130 Continue atorvastatin 20 mg p.o. daily.    Bipolar I disorder, most recent episode depressed, severe w psychosis (HCC) Continue Cymbalta 60 mg p.o. daily. Continue risperidone 2 mg p.o. daily PRN. Continue divalproex 1500 mg p.o. bedtime. Continue risperidone 4 mg p.o. at bedtime    Chronic obstructive pulmonary disease (HCC) Continue supplemental oxygen. Bronchodilators as needed.    DVT prophylaxis: No heparin infusion. Code Status: Full code. Family Communication: Disposition Plan: Observation for unstable angina evaluation and  treatment. Consults called: Admission status: Observation/telemetry.   Reubin Milan MD [facility]  08/06/2018, 8:54 PM   This document was prepared using Dragon voice recognition software may contain some unintended transcription errors.

## 2018-08-06 NOTE — ED Notes (Addendum)
ED TO INPATIENT HANDOFF REPORT  ED Nurse Name and Phone #:  S Name/Age/Gender Allison Sharp 63 y.o. female Room/Bed: WA25/WA25  Code Status   Code Status: Prior  Home/SNF/Other Home Patient oriented to: self, place, time and situation Is this baseline? Yes   Triage Complete: Triage complete  Chief Complaint chest pain  Triage Note Patient c/o intermittent left chest pain x 1 week. patient states she feels more SOB than usual. Patient states pain radiates into the left neck. Patient wears home O2 for COPD.   Allergies Allergies  Allergen Reactions  . Abilify [Aripiprazole] Other (See Comments)    hallucinations  . Naproxen Nausea And Vomiting and Swelling  . Ativan [Lorazepam] Other (See Comments)    Delirium  . Haldol [Haloperidol] Other (See Comments)    Hallucinating   . Hydroxyzine Other (See Comments)    hallucinations    Level of Care/Admitting Diagnosis ED Disposition    ED Disposition Condition Comment   Admit  Hospital Area: [facility] [[id number]]  Level of Care: Telemetry [5]  Admit to tele based on following criteria: Monitor for Ischemic changes  Diagnosis: Chest pain [240973]  Admitting Physician: Reubin Milan [[id number]]  Attending Physician: Reubin Milan [[id number]]  PT Class (Do Not Modify): Observation [104]  PT Acc Code (Do Not Modify): Observation [10022]       B Medical/Surgery History Past Medical History:  Diagnosis Date  . Allergy   . Anxiety   . Arthritis   . Asthma   . Bipolar 1 disorder ([place])   . CAD (coronary artery disease)   . Cellulitis   . CHF (congestive heart failure) ([facility])    diastolic dysfunction  . Chronic back pain   . Chronic headaches   . Complication of anesthesia    States she typically gets sick s/p anesthesia  . Contusion of sacrum   . COPD (chronic obstructive pulmonary disease) ([place])   . Depression   . Dyspnea    PFT 03/05/09 FEV1 2.77(98%), FVC 3.25(86%), FEV1% 85, TLC  5.88(99%), DLCO 60% ,  Methacholine challenge 03/16/09 normal ,  CT chest 03/12/09 no pulmonary disease  . Fungal infection   . GERD (gastroesophageal reflux disease)   . History of colonoscopy 10/17/2002   by Dr Rehman-> distal non-specific proctitis, small ext hemorrhoids,   . HTN (hypertension)   . Hyperlipidemia   . Hyperthyroidism   . Hypothyroidism    States she only has hyperthyroidism  . Migraine headache   . Morbid obesity with body mass index of 50.0-59.9 in adult [facility]) JAN 2011 370 LBS   2004 311 BMI 45.9  . Myocardial infarction ([place])    NOV 1997  . OSA on CPAP    she had been on 2L O2 at night but that was stopped  . PONV (postoperative nausea and vomiting)   . PTSD (post-traumatic stress disorder)   . Suicidal ideation   . Urine incontinence   . Vitamin D deficiency    Past Surgical History:  Procedure Laterality Date  . ABDOMINAL HYSTERECTOMY     sept 1996  . APPENDECTOMY    . BACK SURGERY  2008  . CARDIAC CATHETERIZATION     nov 1997  . CHOLECYSTECTOMY    . COLONOSCOPY  10/17/2002    Distal proctitis, small external hemorrhoids, otherwise/  normal colonoscopy. Suspect rectal bleeding secondary to hemorrhoids  . ESOPHAGOGASTRODUODENOSCOPY  03/18/09   fundic gland polyps/mild gastritis  . HERNIA REPAIR  1978  .  JOINT REPLACEMENT     bil knee replacement  . KNEE ARTHROSCOPY    . MULTIPLE EXTRACTIONS WITH ALVEOLOPLASTY N/A 08/16/2015   Procedure: EXTRACTION OF TEETH THREE, SIX, EIGHT, NINE, ELEVEN, FOURTEEN, FIFTEEN, TWENTY-EIGHT WITH ALVEOLOPLASTY;  Surgeon: Diona Browner, DDS;  Location: [facility];  Service: Oral Surgery;  Laterality: N/A;  . TONSILLECTOMY    . TOTAL VAGINAL HYSTERECTOMY    . TUBAL LIGATION       A IV Location/Drains/Wounds Patient Lines/Drains/Airways Status   Active Line/Drains/Airways    Name:   Placement date:   Placement time:   Site:   Days:   Peripheral IV 08/06/18 Right Antecubital   08/06/18    1506    Antecubital   less than 1    External Urinary Catheter   01/23/18    1434    -   195   Incision (Closed) 08/16/15 Lip Other (Comment)   08/16/15    0935     1086          Intake/Output Last 24 hours No intake or output data in the 24 hours ending 08/06/18 1913  Labs/Imaging Results for orders placed or performed during the hospital encounter of 08/06/18 (from the past 48 hour(s))  Brain natriuretic peptide     Status: None   Collection Time: 08/06/18  3:04 PM  Result Value Ref Range   B Natriuretic Peptide 23.9 0.0 - 100.0 pg/mL    Comment: Performed at [facility], [place] [address], [place], [place] [address]  Protime-INR     Status: None   Collection Time: 08/06/18  3:04 PM  Result Value Ref Range   Prothrombin Time 12.5 11.4 - 15.2 seconds   INR 0.9 0.8 - 1.2    Comment: (NOTE) INR goal varies based on device and disease states. Performed at [facility], [place] [address], [place], [place] [address]   APTT     Status: None   Collection Time: 08/06/18  3:04 PM  Result Value Ref Range   aPTT 28 24 - 36 seconds    Comment: Performed at [facility], [place] [address] [place], [place] [address]  Comprehensive metabolic panel     Status: Abnormal   Collection Time: 08/06/18  3:04 PM  Result Value Ref Range   Sodium 140 135 - 145 mmol/L   Potassium 3.8 3.5 - 5.1 mmol/L   Chloride 105 98 - 111 mmol/L   CO2 28 22 - 32 mmol/L   Glucose, Bld 105 (H) 70 - 99 mg/dL   BUN 12 8 - 23 mg/dL   Creatinine, Ser 0.90 0.44 - 1.00 mg/dL   Calcium 9.1 8.9 - 10.3 mg/dL   Total Protein 6.2 (L) 6.5 - 8.1 g/dL   Albumin 3.6 3.5 - 5.0 g/dL   AST 19 15 - 41 U/L   ALT 12 0 - 44 U/L   Alkaline Phosphatase 61 38 - 126 U/L   Total Bilirubin 0.7 0.3 - 1.2 mg/dL   GFR calc non Af Amer >60 >60 mL/min   GFR calc Af Amer >60 >60 mL/min   Anion gap 7 5 - 15    Comment: Performed at [facility], [place] [address], [place], [place] [address]  CBC     Status:  Abnormal   Collection Time: 08/06/18  3:05 PM  Result Value Ref Range   WBC 6.2 4.0 - 10.5 K/uL   RBC 4.04 3.87 - 5.11 MIL/uL   Hemoglobin  12.6 12.0 - 15.0 g/dL   HCT 39.0 36.0 - 46.0 %   MCV 96.5 80.0 - 100.0 fL   MCH 31.2 26.0 - 34.0 pg   MCHC 32.3 30.0 - 36.0 g/dL   RDW 12.8 11.5 - 15.5 %   Platelets 98 (L) 150 - 400 K/uL    Comment: REPEATED TO VERIFY PLATELET COUNT CONFIRMED BY SMEAR SPECIMEN CHECKED FOR CLOTS Immature Platelet Fraction may be clinically indicated, consider ordering this additional test [id number]    nRBC 0.0 0.0 - 0.2 %    Comment: Performed at [facility], [place] [address] [place], [place] [address]  I-stat troponin, ED     Status: None   Collection Time: 08/06/18  3:12 PM  Result Value Ref Range   Troponin i, poc 0.00 0.00 - 0.08 ng/mL   Comment 3            Comment: Due to the release kinetics of cTnI, a negative result within the first hours of the onset of symptoms does not rule out myocardial infarction with certainty. If myocardial infarction is still suspected, repeat the test at appropriate intervals.   CBG monitoring, ED     Status: None   Collection Time: 08/06/18  4:16 PM  Result Value Ref Range   Glucose-Capillary 82 70 - 99 mg/dL   Dg Chest 2 View  Result Date: 08/06/2018 CLINICAL DATA:  Acute chest pain for 1 week. EXAM: CHEST - 2 VIEW COMPARISON:  10/22/2017 and prior radiographs FINDINGS: The cardiomediastinal silhouette is unremarkable. Possible trace effusions on the LATERAL view noted. There is no evidence of focal airspace disease, pulmonary edema, suspicious pulmonary nodule/mass, or pneumothorax. No acute bony abnormalities are identified. IMPRESSION: Possible trace bilateral pleural effusions without other acute abnormality. Electronically Signed   By: Margarette Canada M.D.   On: 08/06/2018 16:38    Pending Labs Unresulted Labs (From admission, onward)    Start     Ordered   08/07/18 0500  CBC  Daily,   R      08/06/18 1715   08/06/18 2300  Heparin level (unfractionated)  Once,   R     08/06/18 1715   08/06/18 1814  Magnesium  ONCE - STAT,   STAT     08/06/18 1814          Vitals/Pain Today's Vitals   08/06/18 1618 08/06/18 1642 08/06/18 1700 08/06/18 1830  BP: (!) 147/94 137/90 126/86 136/85  Pulse: 85 77 89 82  Resp: (!) 23 14 16 15   Temp:      TempSrc:      SpO2: 100% 100% 95% 100%  Weight:      Height:      PainSc:        Isolation Precautions No active isolations  Medications Medications  nitroGLYCERIN (NITROSTAT) SL tablet 0.4 mg (0.4 mg Sublingual Given 08/06/18 1718)  heparin ADULT infusion 100 units/mL (25000 units/261mL sodium chloride 0.45%) (1,600 Units/hr Intravenous New Bag/Given 08/06/18 1648)  magnesium sulfate IVPB 2 g 50 mL (2 g Intravenous New Bag/Given 08/06/18 1856)  sodium chloride flush (NS) 0.9 % injection 3 mL (3 mLs Intravenous Given 08/06/18 1507)  aspirin chewable tablet 324 mg (324 mg Oral Given 08/06/18 1546)  heparin bolus via infusion 4,000 Units (4,000 Units Intravenous Bolus from Bag 08/06/18 1648)  potassium chloride SA (K-DUR,KLOR-CON) CR tablet 40 mEq (40 mEq Oral Given 08/06/18 1842)    Mobility walks with device High fall risk   Focused Assessments  Cardiac Assessment Handoff:    Lab Results  Component Value Date   TROPONINI <0.03 10/22/2017   Lab Results  Component Value Date   DDIMER 0.43 10/22/2017   Does the Patient currently have chest pain? Yes     R Recommendations: See Admitting Provider Note  Report given to:   Additional Notes: chest pain and sob for couple of days.

## 2018-08-07 ENCOUNTER — Observation Stay (HOSPITAL_BASED_OUTPATIENT_CLINIC_OR_DEPARTMENT_OTHER): Payer: Medicare Other

## 2018-08-07 ENCOUNTER — Encounter (HOSPITAL_COMMUNITY): Payer: Self-pay | Admitting: Internal Medicine

## 2018-08-07 ENCOUNTER — Observation Stay (HOSPITAL_COMMUNITY): Payer: Medicare Other

## 2018-08-07 DIAGNOSIS — J438 Other emphysema: Secondary | ICD-10-CM

## 2018-08-07 DIAGNOSIS — G4733 Obstructive sleep apnea (adult) (pediatric): Secondary | ICD-10-CM | POA: Diagnosis not present

## 2018-08-07 DIAGNOSIS — I25118 Atherosclerotic heart disease of native coronary artery with other forms of angina pectoris: Secondary | ICD-10-CM | POA: Diagnosis not present

## 2018-08-07 DIAGNOSIS — I200 Unstable angina: Secondary | ICD-10-CM | POA: Diagnosis not present

## 2018-08-07 DIAGNOSIS — F315 Bipolar disorder, current episode depressed, severe, with psychotic features: Secondary | ICD-10-CM | POA: Diagnosis not present

## 2018-08-07 DIAGNOSIS — I110 Hypertensive heart disease with heart failure: Secondary | ICD-10-CM | POA: Diagnosis not present

## 2018-08-07 DIAGNOSIS — E559 Vitamin D deficiency, unspecified: Secondary | ICD-10-CM | POA: Diagnosis not present

## 2018-08-07 DIAGNOSIS — I5032 Chronic diastolic (congestive) heart failure: Secondary | ICD-10-CM | POA: Diagnosis not present

## 2018-08-07 DIAGNOSIS — R079 Chest pain, unspecified: Secondary | ICD-10-CM | POA: Diagnosis not present

## 2018-08-07 DIAGNOSIS — R9431 Abnormal electrocardiogram [ECG] [EKG]: Secondary | ICD-10-CM | POA: Diagnosis not present

## 2018-08-07 DIAGNOSIS — E039 Hypothyroidism, unspecified: Secondary | ICD-10-CM | POA: Diagnosis not present

## 2018-08-07 LAB — CBC
HEMATOCRIT: 36.5 % (ref 36.0–46.0)
Hemoglobin: 11.5 g/dL — ABNORMAL LOW (ref 12.0–15.0)
MCH: 31 pg (ref 26.0–34.0)
MCHC: 31.5 g/dL (ref 30.0–36.0)
MCV: 98.4 fL (ref 80.0–100.0)
Platelets: 86 10*3/uL — ABNORMAL LOW (ref 150–400)
RBC: 3.71 MIL/uL — ABNORMAL LOW (ref 3.87–5.11)
RDW: 12.8 % (ref 11.5–15.5)
WBC: 7.5 10*3/uL (ref 4.0–10.5)
nRBC: 0 % (ref 0.0–0.2)

## 2018-08-07 LAB — TROPONIN I: Troponin I: <0.03 ng/mL (ref <0.03)

## 2018-08-07 LAB — ECHOCARDIOGRAM COMPLETE
Height: 69 in
Weight: 6052.8 oz

## 2018-08-07 LAB — OCCULT BLOOD X 1 CARD TO LAB, STOOL: Fecal Occult Bld: NEGATIVE

## 2018-08-07 LAB — HEPARIN LEVEL (UNFRACTIONATED): Heparin Unfractionated: 0.43 IU/mL (ref 0.30–0.70)

## 2018-08-07 MED ORDER — SODIUM CHLORIDE 0.9 % IV SOLN: 250.0000 mL | INTRAVENOUS | Status: DC | PRN

## 2018-08-07 MED ORDER — PERFLUTREN LIPID MICROSPHERE
1.0000 mL | INTRAVENOUS | Status: AC | PRN
  Administered 2018-08-07: 2 mL via INTRAVENOUS
  Filled 2018-08-07: qty 10

## 2018-08-07 MED ORDER — SODIUM CHLORIDE 0.9% FLUSH: 3.0000 mL | INTRAVENOUS | Status: DC | PRN

## 2018-08-07 MED ORDER — SODIUM CHLORIDE 0.9 % WEIGHT BASED INFUSION
3.0000 mL/kg/h | INTRAVENOUS | Status: DC
  Administered 2018-08-08: 3 mL/kg/h via INTRAVENOUS

## 2018-08-07 MED ORDER — SODIUM CHLORIDE 0.9% FLUSH
3.0000 mL | Freq: Two times a day (BID) | INTRAVENOUS | Status: DC
  Administered 2018-08-07: 3 mL via INTRAVENOUS

## 2018-08-07 MED ORDER — HYDROMORPHONE HCL 1 MG/ML IJ SOLN
1.0000 mg | Freq: Once | INTRAMUSCULAR | Status: AC
  Administered 2018-08-07: 1 mg via INTRAVENOUS
  Filled 2018-08-07: qty 1

## 2018-08-07 MED ORDER — SODIUM CHLORIDE 0.9 % WEIGHT BASED INFUSION
1.0000 mL/kg/h | INTRAVENOUS | Status: DC
  Administered 2018-08-08: 1 mL/kg/h via INTRAVENOUS

## 2018-08-07 MED ORDER — ASPIRIN 81 MG PO CHEW
81.0000 mg | CHEWABLE_TABLET | Freq: Every day | ORAL | Status: DC
  Administered 2018-08-07 – 2018-08-08 (×2): 81 mg via ORAL
  Filled 2018-08-07 (×3): qty 1

## 2018-08-07 NOTE — H&P (View-Only) (Signed)
Cardiology Consult    Patient ID: Allison Sharp MRN: [id number], DOB/AGE: 1955/09/14   Admit date: 08/06/2018 Date of Consult: 08/07/2018  Primary Physician: Dettinger, Fransisca Kaufmann, MD Primary Cardiologist: Dorris Carnes, MD Requesting Provider: Phillips Climes, MD  Patient Profile    Allison Sharp is a 63 y.o. female with a history of mild LAD disease on remote cardiac catheterization in 1997, hypertension, hyperlipidemia, COPD on home O2, obstructive sleep apnea on CPAP, hypothyroidism, chronic back pain, chronic headaches, bipolar disorder, depression, and anxiety who is being seen today for the evaluation of chest pain at the request of Dr. Waldron Labs.  History of Present Illness    [name] is a 63 year old female with the above history who has been seen by Dr. Harrington Challenger in the past. Patient was last seen by Dr. Harrington Challenger in 02/2015 for her hypertension and sleep apnea. She was not on CPAP at the time so a sleep study was ordered. Patient then saw Dr. Radford Pax in 11/2015 for evaluation of obstructive sleep apnea. At that time, she was doing well with her CPAP machine; however, she was still waking up at night with shortness of breath. She also reported chronic dyspnea on exertion due to her asthma but thought it had gotten worse and was limiting her daily activities. Therefore, a Lexiscan Myoview and Echocardiogram were ordered to further evaluate her symptoms. Echo showed LVEF of 55-60% with mild LVH and grade 1 diastolic dysfunction but no regional wall motion abnormalities. Myoview was low risk and showed one medium defect of mild severity present in the basal inferolateral, mid inferolateral, and apex location that was felt to be secondary to bowel loop attenuation but no significant ischemia. Patient has not been seen in our office since that time.   Patient presented to the ED yesterday for evaluation of chest pain. Patient reports chest pain and shortness of breath with minimal exertion (washing  dishes or walking short distances) over the last years that improves with rest that she just attributed to her pulmonary issues. However, over the last 3-4 days, she she reports substernal chest tightness/heaviness at rest that has been mostly constant. She ranks the pain as a 6-7/10 on the pain scale at its worse. She also reports worsening shortness of breath over the last several days which she noticed before the chest pain. She also notes some associated dizziness and nausea as well but denies any diaphoresis or syncope. She has stable orthopnea and sleeps on an incline and with 2 pillows but she does report some PND recently even with her using her CPAP machine. She states "my legs stay big" but she does thinks she has had more edema around her ankles recently. She also notes some nasal congestion and a productive cough but no fevers. Of note, she has noticed some blood in her stools recently which she has not seen anyone for. Patient on home O2 24/7 at home. She states sometimes if she does not have her O2 on, she will noticed that the tips of her fingers turn "black." She states this resolves when she puts her O2 back on.  In the ED, EKG showed no acute ischemic changes. I-stat troponin negative. Chest x-ray showed possible trace bilateral pleural effusion but no other acute findings. BNP normal. WBC 6.2, Hgb 12.6, Plts 98. Na 140, K 3.8, Glucose 105, SCr 0.90. Patient was admitted for further evaluation.  At the time of this evaluation, patient is resting comfortably. She is still having some  mild chest tightness that she ranks as a 4-5/10 on the pain scale but states that it is much improved from before. She also notes a headache from the Nitroglycerin.   Patient denies any history of tobacco, alcohol, or recreational drug use. She does have a family history of heart disease in her mother who had a heart attack in her 9's and also had aortic stenosis. No other known family of heart disease.   Past  Medical History   Past Medical History:  Diagnosis Date  . Allergy   . Anxiety   . Arthritis   . Asthma   . Bipolar 1 disorder ([place])   . CAD (coronary artery disease)   . Cellulitis   . CHF (congestive heart failure) (H[facility])    diastolic dysfunction  . Chronic back pain   . Chronic headaches   . Complication of anesthesia    States she typically gets sick s/p anesthesia  . Contusion of sacrum   . COPD (chronic obstructive pulmonary disease) ([place])   . Depression   . Dyspnea    PFT 03/05/09 FEV1 2.77(98%), FVC 3.25(86%), FEV1% 85, TLC 5.88(99%), DLCO 60% ,  Methacholine challenge 03/16/09 normal ,  CT chest 03/12/09 no pulmonary disease  . Fungal infection   . GERD (gastroesophageal reflux disease)   . History of colonoscopy 10/17/2002   by Dr Rehman-> distal non-specific proctitis, small ext hemorrhoids,   . HTN (hypertension)   . Hyperlipidemia   . Hyperthyroidism   . Hypothyroidism    States she only has hyperthyroidism  . Migraine headache   . Morbid obesity with body mass index of 50.0-59.9 in adult [facility]) JAN 2011 370 LBS   2004 311 BMI 45.9  . Myocardial infarction ([place])    NOV 1997  . OSA on CPAP    she had been on 2L O2 at night but that was stopped  . PONV (postoperative nausea and vomiting)   . PTSD (post-traumatic stress disorder)   . Suicidal ideation   . Urine incontinence   . Vitamin D deficiency     Past Surgical History:  Procedure Laterality Date  . ABDOMINAL HYSTERECTOMY     sept 1996  . APPENDECTOMY    . BACK SURGERY  2008  . CARDIAC CATHETERIZATION     nov 1997  . CHOLECYSTECTOMY    . COLONOSCOPY  10/17/2002    Distal proctitis, small external hemorrhoids, otherwise/  normal colonoscopy. Suspect rectal bleeding secondary to hemorrhoids  . ESOPHAGOGASTRODUODENOSCOPY  03/18/09   fundic gland polyps/mild gastritis  . HERNIA REPAIR  1978  . JOINT REPLACEMENT     bil knee replacement  . KNEE ARTHROSCOPY    . MULTIPLE EXTRACTIONS WITH  ALVEOLOPLASTY N/A 08/16/2015   Procedure: EXTRACTION OF TEETH THREE, SIX, EIGHT, NINE, ELEVEN, FOURTEEN, FIFTEEN, TWENTY-EIGHT WITH ALVEOLOPLASTY;  Surgeon: Diona Browner, DDS;  Location: [facility];  Service: Oral Surgery;  Laterality: N/A;  . TONSILLECTOMY    . TOTAL VAGINAL HYSTERECTOMY    . TUBAL LIGATION       Allergies  Allergies  Allergen Reactions  . Abilify [Aripiprazole] Other (See Comments)    hallucinations  . Naproxen Nausea And Vomiting and Swelling  . Ativan [Lorazepam] Other (See Comments)    Delirium  . Haldol [Haloperidol] Other (See Comments)    Hallucinating   . Hydroxyzine Other (See Comments)    hallucinations    Inpatient Medications    . atorvastatin  20 mg Oral Daily  . benztropine  1 mg  Oral QHS  . celecoxib  200 mg Oral BID  . cloNIDine  0.1 mg Oral QHS  . divalproex  1,500 mg Oral QHS  . DULoxetine  60 mg Oral Daily  . feeding supplement (ENSURE ENLIVE)  237 mL Oral BID BM  . levothyroxine  125 mcg Oral QAC breakfast  . loratadine  10 mg Oral Daily  . pantoprazole  40 mg Oral Daily  . risperidone  4 mg Oral QHS    Family History    Family History  Problem Relation Age of Onset  . Hypertension Mother   . Bipolar disorder Mother   . Dementia Mother   . Depression Mother   . Heart attack Mother   . AAA (abdominal aortic aneurysm) Mother   . Coronary artery disease Father   . Alcohol abuse Father   . Hypertension Brother   . Coronary artery disease Brother   . Bipolar disorder Brother   . Depression Brother   . Depression Sister   . Paranoid behavior Sister   . Cancer Sister        breast  . Bipolar disorder Sister   . Depression Sister   . Hypertension Sister   . Cancer Son        thyroid  . Cancer Maternal Aunt        breast metastatized to brain  . Anesthesia problems Neg Hx   . Hypotension Neg Hx   . Malignant hyperthermia Neg Hx   . Pseudochol deficiency Neg Hx    She indicated that her mother is deceased. She indicated that  her father is deceased. She indicated that both of her sisters are alive. She indicated that her brother is alive. She indicated that her maternal grandmother is deceased. She indicated that her maternal grandfather is deceased. She indicated that her paternal grandmother is deceased. She indicated that her paternal grandfather is deceased. She indicated that both of her sons are alive. She indicated that the status of her maternal aunt is unknown. She indicated that the status of her neg hx is unknown.   Social History    Social History   Socioeconomic History  . Marital status: Single    Spouse name: Not on file  . Number of children: 2  . Years of education: Not on file  . Highest education level: Not on file  Occupational History  . Occupation: Disabled    Fish farm manager: UNEMPLOYED    Comment: back problems  Social Needs  . Financial resource strain: Not on file  . Food insecurity:    Worry: Not on file    Inability: Not on file  . Transportation needs:    Medical: Not on file    Non-medical: Not on file  Tobacco Use  . Smoking status: Never Smoker  . Smokeless tobacco: Never Used  Substance and Sexual Activity  . Alcohol use: No    Alcohol/week: 0.0 standard drinks  . Drug use: No  . Sexual activity: Never    Birth control/protection: Abstinence  Lifestyle  . Physical activity:    Days per week: Not on file    Minutes per session: Not on file  . Stress: Not on file  Relationships  . Social connections:    Talks on phone: Not on file    Gets together: Not on file    Attends religious service: Not on file    Active member of club or organization: Not on file    Attends meetings of clubs or organizations:  Not on file    Relationship status: Not on file  . Intimate partner violence:    Fear of current or ex partner: Not on file    Emotionally abused: Not on file    Physically abused: Not on file    Forced sexual activity: Not on file  Other Topics Concern  . Not on  file  Social History Narrative   [name] is disabled and lives with her sister, Adilene. She has another sister with whom she has lived with in the past, and who is a Designer, jewellery, but not currently practicing.   She has two grown sons that she does not see often, as they live in other states. She has 5 grand children.   [name] has a long history of mental illness including depression, PTSD, suicidal and homicidal ideation.   She has been obese most all of her life. Her weight has significantly impacted her QOL. She recently lost 20 lbs. By decreasing portion size & increasing proteins.      Review of Systems    Review of Systems  Constitutional: Positive for chills. Negative for fever and weight loss.  HENT: Positive for congestion.   Respiratory: Positive for cough, sputum production and shortness of breath. Negative for hemoptysis.   Cardiovascular: Positive for chest pain, palpitations, orthopnea, leg swelling and PND.  Gastrointestinal: Positive for blood in stool, diarrhea and nausea. Negative for vomiting.  Genitourinary: Negative for hematuria.  Musculoskeletal: Negative for myalgias.  Neurological: Positive for dizziness and headaches. Negative for loss of consciousness.  Endo/Heme/Allergies: Does not bruise/bleed easily.  Psychiatric/Behavioral: Negative for substance abuse.    Physical Exam    Blood pressure 124/78, pulse 77, temperature 97.7 F (36.5 C), temperature source Oral, resp. rate 20, height 5\' 9"  (1.753 m), weight (!) 171.6 kg, last menstrual period 02/18/1995, SpO2 93 %.  General: 63 y.o. morbidly obese Caucasian female resting comfortably in no acute distress. Pleasant and cooperative. Currently on supplemental O2 via nasal cannula. HEENT: Normal  Neck: Supple. No carotid bruits. Difficult to assess JVD due to body habitus. Lungs: No increased work of breathing. Clear to auscultation bilaterally. No wheezes, rhonchi, or rales. Heart: RRR. Distinct S1 and  S2. No murmurs, gallops, or rubs.  Abdomen: Soft, obese, and non-tender to palpation. Bowel sounds present in all 4 quadrants.   Extremities: No significant pitting edema noted of lower extremities; however, difficult to tell due to body habitus. Radial pulses 1+ and equal bilaterally. Skin: Extremities cool (hands more than feet) and dry. Neuro: Alert and oriented x3. No focal deficits. Moves all extremities spontaneously. Psych: Normal affect. Responds appropriately.  Labs    Troponin [facility] of Care Test) Recent Labs    08/06/18 1512  TROPIPOC 0.00   Recent Labs    08/06/18 2145 08/07/18 0342  TROPONINI <0.03 <0.03   Lab Results  Component Value Date   WBC 7.5 08/07/2018   HGB 11.5 (L) 08/07/2018   HCT 36.5 08/07/2018   MCV 98.4 08/07/2018   PLT 86 (L) 08/07/2018    Recent Labs  Lab 08/06/18 1504  NA 140  K 3.8  CL 105  CO2 28  BUN 12  CREATININE 0.90  CALCIUM 9.1  PROT 6.2*  BILITOT 0.7  ALKPHOS 61  ALT 12  AST 19  GLUCOSE 105*   Lab Results  Component Value Date   CHOL 236 (H) 07/02/2015   HDL 39 (L) 07/02/2015   LDLCALC 161 (H) 07/02/2015   TRIG 181 (  H) 07/02/2015   Lab Results  Component Value Date   DDIMER 0.43 10/22/2017     Radiology Studies    Dg Chest 2 View  Result Date: 08/06/2018 CLINICAL DATA:  Acute chest pain for 1 week. EXAM: CHEST - 2 VIEW COMPARISON:  10/22/2017 and prior radiographs FINDINGS: The cardiomediastinal silhouette is unremarkable. Possible trace effusions on the LATERAL view noted. There is no evidence of focal airspace disease, pulmonary edema, suspicious pulmonary nodule/mass, or pneumothorax. No acute bony abnormalities are identified. IMPRESSION: Possible trace bilateral pleural effusions without other acute abnormality. Electronically Signed   By: Margarette Canada M.D.   On: 08/06/2018 16:38    EKG     EKG: EKG was personally reviewed and demonstrates: Normal sinus rhythm, rate 78 bpm, with no acute ischemic  changes.  Telemetry: Telemetry was personally reviewed and demonstrates: Sinus rhythm with heart rates in the 70's to 120's.  Cardiac Imaging    Echocardiogram 04/03/2016: Study Conclusions: - Left ventricle: The cavity size was normal. Wall thickness was   increased in a pattern of mild LVH. Systolic function was normal.   The estimated ejection fraction was in the range of 55% to 60%.   Although no diagnostic regional wall motion abnormality was   identified, this possibility cannot be completely excluded on the   basis of this study. Doppler parameters are consistent with   abnormal left ventricular relaxation (grade 1 diastolic   dysfunction). - Left atrium: The atrium was mildly dilated. _______________  Myoview 04/04/2016:  Nuclear stress EF: 63%. No wall motion abnormalities  Defect 1: There is a medium defect of mild severity present in the basal inferolateral, mid inferolateral and apex location. This defect is likely secondary to bowel loop attenuation seen on the raw images.  There was no ST segment deviation noted during stress.  This is a low risk study. No significant ischemia identified.  Assessment & Plan    Chest Pain Concerning for Unstable Angina - Patient presents with constant substernal chest tightness/heaviness over the last 3-4 days that improved with Nitroglycerin. She also reports some chest heaviness and shortness of breath with minimal exertion for the last year. Patient has a history of mild LAD disease on cardiac catheterization in 1997 and low risk Myoview in 2017.  - EKG showed normal sinus rhythm with no acute ischemic changes. - Troponin negative x3.  - BNP normal.  - Echo has been ordered. - Will check hemoglobin A1c and lipid panel. - Patient continues to have some chest heaviness this morning. - Primary team started IV Heparin. - Chest pain may be secondary to chronic respiratory conditions. However, her symptoms are also concerning for  unstable angina. Patient has multiple CV risk factors (HTN, HLD, pre-diabetes, obesity). Do not know how beneficial Myoview or coronary CT will be due to body habitus. Consider cardiac catheterization for further evaluation. Will discuss with MD.  Hypertension - BP currently well controlled. - Continue home Clonidine.  Hyperlipidemia - Will check lipid panel. - Currently on Lipitor 20mg  daily at home.   Of note, patient reports recent blood in her stools. Hemoglobin slightly low at 11.5. Will order hemoccult but will defer management to primary team.   Otherwise, per primary team.  Signed, Darreld Mclean, PA-C 08/07/2018, 7:12 AM  For questions or updates, please contact   Please consult [email or web address] for contact info under Cardiology/STEMI. ---------------------------------------------------------------------------------------------   History and all data above reviewed.  Patient examined.  I agree with the findings as above.  AUBRII SHARPLESS presents with chest pain that has some exertional features as well as some atypical features.  Her chest discomfort worsens with exertion and is relieved by rest and nitroglycerin, however it has been somewhat constant as well over the past several days.  Her risk factors include significant secondhand smoking exposure though she is a personal never smoker, and morbid obesity.  She has previously had ischemic evaluations which have been unremarkable.   Constitutional: Significant shortness of breath with minimal exertion.  Morbidly obese. Cardiovascular: regular rhythm, normal rate, no murmurs. S1 and S2 normal. Radial pulses 2/4 bilaterally. Respiratory: clear to auscultation bilaterally GI : normal bowel sounds, soft and nontender. No distention.   MSK: extremities warm, well perfused.  Significant diffuse edema without pitting.  NEURO: grossly nonfocal exam, moves all extremities. PSYCH: alert and oriented x 3, normal mood and affect.    All available labs, radiology testing, previous records reviewed. Agree with documented assessment and plan of my colleague as stated above with the following additions or changes:  Principal Problem:   Chest pain Active Problems:   Hypothyroidism   Morbid obesity ([place])   GERD   Coronary artery disease   OSA on CPAP   Essential hypertension, benign   Hyperlipidemia LDL goal <130   Bipolar I disorder, most recent episode depressed, severe w psychosis ([place])   Chronic obstructive pulmonary disease ([place])   Prolonged QT interval    Plan: The patient and I have participated in shared decision making regarding her presentation to the hospital for chest pain and shortness of breath.  We recognize that this may be primarily contributed to by lung disease, however her symptoms are also concerning for angina.  It would be reasonable to consider a coronary angiogram with access through the radial artery for diagnostic purposes.  If this is normal, it is likely driven by her underlying lung disease.  The patient is in agreement and willing to proceed with coronary angiogram.  We will plan this for tomorrow.  In the absence of ACS, heparin can be discontinued.  INFORMED CONSENT: I have reviewed the risks, indications, and alternatives to cardiac catheterization, possible angioplasty, and stenting with the patient. Risks include but are not limited to bleeding, infection, vascular injury, stroke, myocardial infection, arrhythmia, kidney injury, radiation-related injury in the case of prolonged fluoroscopy use, emergency cardiac surgery, and death. The patient understands the risks of serious complication is 1-2 in 1115 with diagnostic cardiac cath and 1-2% or less with angioplasty/stenting.     Length of Stay:  LOS: 0 days   Elouise Munroe, MD [facility] 1:12 PM  08/07/2018

## 2018-08-07 NOTE — Progress Notes (Signed)
ANTICOAGULATION CONSULT NOTE - [place] for IV Heparin Indication: chest pain/ACS  Allergies  Allergen Reactions  . Abilify [Aripiprazole] Other (See Comments)    hallucinations  . Naproxen Nausea And Vomiting and Swelling  . Ativan [Lorazepam] Other (See Comments)    Delirium  . Haldol [Haloperidol] Other (See Comments)    Hallucinating   . Hydroxyzine Other (See Comments)    hallucinations    Patient Measurements: Height: 5\' 9"  (175.3 cm) Weight: (!) 378 lb 4.8 oz (171.6 kg) IBW/kg (Calculated) : 66.2 Heparin Dosing Weight: 111.5 kg   Vital Signs: Temp: 97.7 F (36.5 C) (03/11 0418) Temp Source: Oral (03/11 0418) BP: 124/78 (03/11 0418) Pulse Rate: 77 (03/11 0418)  Labs: Recent Labs    08/06/18 1504 08/06/18 1505 08/06/18 2145 08/06/18 2246 08/07/18 0342 08/07/18 0902  HGB  --  12.6  --   --  11.5*  --   HCT  --  39.0  --   --  36.5  --   PLT  --  98*  --   --  86*  --   APTT 28  --   --   --   --   --   LABPROT 12.5  --   --   --   --   --   INR 0.9  --   --   --   --   --   HEPARINUNFRC  --   --   --  0.23*  --  0.43  CREATININE 0.90  --   --   --   --   --   TROPONINI  --   --  <0.03  --  <0.03  --     Estimated Creatinine Clearance: 108.1 mL/min (by C-G formula based on SCr of 0.9 mg/dL).   Medical History: Past Medical History:  Diagnosis Date  . Allergy   . Anxiety   . Arthritis   . Asthma   . Bipolar 1 disorder ([place])   . CAD (coronary artery disease)   . Cellulitis   . CHF (congestive heart failure) ([facility])    diastolic dysfunction  . Chronic back pain   . Chronic headaches   . Complication of anesthesia    States she typically gets sick s/p anesthesia  . Contusion of sacrum   . COPD (chronic obstructive pulmonary disease) ([place])   . Depression   . Dyspnea    PFT 03/05/09 FEV1 2.77(98%), FVC 3.25(86%), FEV1% 85, TLC 5.88(99%), DLCO 60% ,  Methacholine challenge 03/16/09 normal ,  CT chest 03/12/09 no pulmonary  disease  . Fungal infection   . GERD (gastroesophageal reflux disease)   . History of colonoscopy 10/17/2002   by Dr Rehman-> distal non-specific proctitis, small ext hemorrhoids,   . HTN (hypertension)   . Hyperlipidemia   . Hyperthyroidism   . Hypothyroidism    States she only has hyperthyroidism  . Migraine headache   . Morbid obesity with body mass index of 50.0-59.9 in adult [facility]) JAN 2011 370 LBS   2004 311 BMI 45.9  . Myocardial infarction ([place])    NOV 1997  . OSA on CPAP    she had been on 2L O2 at night but that was stopped  . PONV (postoperative nausea and vomiting)   . PTSD (post-traumatic stress disorder)   . Suicidal ideation   . Urine incontinence   . Vitamin D deficiency     Medications:  Medications Prior to Admission  Medication  Sig Dispense Refill Last Dose  . albuterol (PROAIR HFA) 108 (90 Base) MCG/ACT inhaler USE 2 PUFF EVERY 4 HOURS AS NEEDED FOR WHEEZING OR SHORTNESS OF BREATH 8.5 g 5 Past Week at Unknown time  . atorvastatin (LIPITOR) 20 MG tablet TAKE 1 TABLET DAILY (Patient taking differently: Take 20 mg by mouth daily. ) 90 tablet 0 08/06/2018 at Unknown time  . benztropine (COGENTIN) 1 MG tablet Take 1 tablet (1 mg total) by mouth at bedtime. 90 tablet 0 08/05/2018 at Unknown time  . BREO ELLIPTA 100-25 MCG/INH AEPB INHALE 1 PUFF INTO THE LUNGS DAILY 60 each 5 Past Month at Unknown time  . celecoxib (CELEBREX) 200 MG capsule TAKE (1) CAPSULE TWICE DAILY. (Patient taking differently: Take 200 mg by mouth 2 (two) times daily. TAKE  (1)  CAPSULE  TWICE DAILY.) 60 capsule 0 08/06/2018 at Unknown time  . cetirizine (ZYRTEC) 10 MG tablet TAKE 1 TABLET ONCE DAILY 30 tablet 4 08/06/2018 at Unknown time  . cloNIDine (CATAPRES) 0.1 MG tablet TAKE ONE TABLET AT BEDTIME 30 tablet 5 08/05/2018 at Unknown time  . diclofenac sodium (VOLTAREN) 1 % GEL Apply to affected area twice daily as needed (Patient taking differently: Apply 2 g topically 2 (two) times daily as needed  (PAIN). ) 100 g 5 Past Month at Unknown time  . divalproex (DEPAKOTE) 500 MG DR tablet Take 3 tablets (1,500 mg total) by mouth at bedtime. 270 tablet 0 08/05/2018 at Unknown time  . DULoxetine (CYMBALTA) 60 MG capsule Take 1 capsule (60 mg total) by mouth daily. 90 capsule 0 08/06/2018 at Unknown time  . fluticasone (FLONASE) 50 MCG/ACT nasal spray Place 1 spray 2 (two) times daily as needed into both nostrils for allergies or rhinitis. 16 g 6 Past Week at Unknown time  . levothyroxine (SYNTHROID, LEVOTHROID) 125 MCG tablet TAKE (1) TABLET DAILY BE- FORE BREAKFAST. (Patient taking differently: Take 125 mcg by mouth daily before breakfast. ) 30 tablet 11 08/06/2018 at Unknown time  . nystatin cream (MYCOSTATIN) APPLY TO THE AFFECTED AREA THREE TIMES DAILY AS NEEDED .Marland KitchenFOR TOPICAL USE ONLY... (Patient taking differently: Apply 1 application topically 3 (three) times daily as needed for dry skin. ) 30 g 0 08/06/2018 at Unknown time  . omeprazole (PRILOSEC) 20 MG capsule TAKE (1) CAPSULE DAILY (Patient taking differently: Take 20 mg by mouth daily. ) 30 capsule 0 08/06/2018 at Unknown time  . ondansetron (ZOFRAN-ODT) 4 MG disintegrating tablet DISSOLVE 1 TABLET IN MOUTH EVERY 8 HOURS AS NEEDED FOR NAUSEA AND VOMITING (Patient taking differently: Take 4 mg by mouth every 8 (eight) hours as needed for nausea or vomiting. ) 20 tablet 0 Past Week at Unknown time  . polyethylene glycol powder (GLYCOLAX/MIRALAX) powder Take 17 g by mouth 2 (two) times daily as needed. (Patient taking differently: Take 17 g by mouth 2 (two) times daily as needed for moderate constipation. ) 3350 g 1 Past Week at Unknown time  . promethazine (PHENERGAN) 25 MG tablet Take 1 tablet (25 mg total) by mouth every 8 (eight) hours as needed for nausea or vomiting. 20 tablet 0 Past Week at Unknown time  . risperiDONE (RISPERDAL) 2 MG tablet Take 1 tablet (2 mg total) by mouth daily as needed. For auditory hallucintations 30 tablet 2 08/05/2018 at  Unknown time  . risperidone (RISPERDAL) 4 MG tablet Take 1 tablet (4 mg total) by mouth at bedtime. 90 tablet 0 08/05/2018 at Unknown time  . therapeutic multivitamin-minerals (THERAGRAN-M) tablet Take 1  tablet by mouth daily.   08/06/2018 at Unknown time  . traMADol (ULTRAM) 50 MG tablet Take 1 tablet (50 mg total) by mouth every 8 (eight) hours as needed for moderate pain. 90 tablet 2 08/05/2018 at Unknown time  . albuterol (PROVENTIL) (2.5 MG/3ML) 0.083% nebulizer solution Take 3 mLs (2.5 mg total) by nebulization every 4 (four) hours as needed for wheezing or shortness of breath. 75 mL 0 prn  . cephALEXin (KEFLEX) 500 MG capsule Take 1 capsule (500 mg total) by mouth 4 (four) times daily. (Patient not taking: Reported on 08/06/2018) 28 capsule 0 Not Taking at Unknown time    Assessment: 63 yo female diagnosed with ACS. No noted blood thinners on pt med rec profile. PMH includes anxiety, arthritis, asthma, bipolar 1 disorder, CAD ,Cellulitis, CHF, chronic back pain, chronic headaches,  COPD with chronic hypoxic respiratory failure on continuous 4 L 02. Depression GERD, HTN, Hyperlipidemia, Hyperthyroidism, Hypothyroidism, Migraine headache, Morbid obesity, Myocardial infarction, OSA on CPAP, PTSD, Suicidal ideation, Urine incontinence, and Vitamin D deficiency. She had a normal nuclear stress test in 2017 and an echocardiogram that showed grade 1 diastolic heart failure.  Her last heart catheterization was in 2001. Pharmacy consulted to dose IV heparin.     Baseline INR 0.9, aPTT: 28  Prior anticoagulation: none  Significant events: none  Today, 08/07/2018:  CBC: Hgb slightly low but stable, WBC WNL  Most recent heparin level therapeutic on 1750 units/hr  No bleeding or infusion issues per nursing  Troponin negative x3  Goal of Therapy: Heparin level 0.3-0.7 units/ml Monitor platelets by anticoagulation protocol: Yes  Plan:  Continue heparin IV infusion at 1750 units/hr  Recheck  confirmatory heparin level in 6 hrs  Daily CBC, daily heparin level once stable  Monitor for signs of bleeding or thrombosis   Tyna Jaksch, PharmD Candidate 08/07/2018, 10:19 AM

## 2018-08-07 NOTE — Consult Note (Addendum)
Cardiology Consult    Patient ID: Allison Sharp MRN: [id number], DOB/AGE: 1955/06/08   Admit date: 08/06/2018 Date of Consult: 08/07/2018  Primary Physician: Dettinger, Fransisca Kaufmann, MD Primary Cardiologist: Dorris Carnes, MD Requesting Provider: Phillips Climes, MD  Patient Profile    Allison Sharp is a 63 y.o. female with a history of mild LAD disease on remote cardiac catheterization in 1997, hypertension, hyperlipidemia, COPD on home O2, obstructive sleep apnea on CPAP, hypothyroidism, chronic back pain, chronic headaches, bipolar disorder, depression, and anxiety who is being seen today for the evaluation of chest pain at the request of Dr. Waldron Labs.  History of Present Illness    Allison Sharp is a 63 year old female with the above history who has been seen by Dr. Harrington Challenger in the past. Patient was last seen by Dr. Harrington Challenger in 02/2015 for her hypertension and sleep apnea. She was not on CPAP at the time so a sleep study was ordered. Patient then saw Dr. Radford Pax in 11/2015 for evaluation of obstructive sleep apnea. At that time, she was doing well with her CPAP machine; however, she was still waking up at night with shortness of breath. She also reported chronic dyspnea on exertion due to her asthma but thought it had gotten worse and was limiting her daily activities. Therefore, a Lexiscan Myoview and Echocardiogram were ordered to further evaluate her symptoms. Echo showed LVEF of 55-60% with mild LVH and grade 1 diastolic dysfunction but no regional wall motion abnormalities. Myoview was low risk and showed one medium defect of mild severity present in the basal inferolateral, mid inferolateral, and apex location that was felt to be secondary to bowel loop attenuation but no significant ischemia. Patient has not been seen in our office since that time.   Patient presented to the ED yesterday for evaluation of chest pain. Patient reports chest pain and shortness of breath with minimal exertion (washing  dishes or walking short distances) over the last years that improves with rest that she just attributed to her pulmonary issues. However, over the last 3-4 days, she she reports substernal chest tightness/heaviness at rest that has been mostly constant. She ranks the pain as a 6-7/10 on the pain scale at its worse. She also reports worsening shortness of breath over the last several days which she noticed before the chest pain. She also notes some associated dizziness and nausea as well but denies any diaphoresis or syncope. She has stable orthopnea and sleeps on an incline and with 2 pillows but she does report some PND recently even with her using her CPAP machine. She states "my legs stay big" but she does thinks she has had more edema around her ankles recently. She also notes some nasal congestion and a productive cough but no fevers. Of note, she has noticed some blood in her stools recently which she has not seen anyone for. Patient on home O2 24/7 at home. She states sometimes if she does not have her O2 on, she will noticed that the tips of her fingers turn "black." She states this resolves when she puts her O2 back on.  In the ED, EKG showed no acute ischemic changes. I-stat troponin negative. Chest x-ray showed possible trace bilateral pleural effusion but no other acute findings. BNP normal. WBC 6.2, Hgb 12.6, Plts 98. Na 140, K 3.8, Glucose 105, SCr 0.90. Patient was admitted for further evaluation.  At the time of this evaluation, patient is resting comfortably. She is still having some  mild chest tightness that she ranks as a 4-5/10 on the pain scale but states that it is much improved from before. She also notes a headache from the Nitroglycerin.   Patient denies any history of tobacco, alcohol, or recreational drug use. She does have a family history of heart disease in her mother who had a heart attack in her 34's and also had aortic stenosis. No other known family of heart disease.   Past  Medical History   Past Medical History:  Diagnosis Date  . Allergy   . Anxiety   . Arthritis   . Asthma   . Bipolar 1 disorder ([place])   . CAD (coronary artery disease)   . Cellulitis   . CHF (congestive heart failure) (HCC)    diastolic dysfunction  . Chronic back pain   . Chronic headaches   . Complication of anesthesia    States she typically gets sick s/p anesthesia  . Contusion of sacrum   . COPD (chronic obstructive pulmonary disease) ([place])   . Depression   . Dyspnea    PFT 03/05/09 FEV1 2.77(98%), FVC 3.25(86%), FEV1% 85, TLC 5.88(99%), DLCO 60% ,  Methacholine challenge 03/16/09 normal ,  CT chest 03/12/09 no pulmonary disease  . Fungal infection   . GERD (gastroesophageal reflux disease)   . History of colonoscopy 10/17/2002   by Dr Rehman-> distal non-specific proctitis, small ext hemorrhoids,   . HTN (hypertension)   . Hyperlipidemia   . Hyperthyroidism   . Hypothyroidism    States she only has hyperthyroidism  . Migraine headache   . Morbid obesity with body mass index of 50.0-59.9 in adult [facility]) JAN 2011 370 LBS   2004 311 BMI 45.9  . Myocardial infarction ([place])    NOV 1997  . OSA on CPAP    she had been on 2L O2 at night but that was stopped  . PONV (postoperative nausea and vomiting)   . PTSD (post-traumatic stress disorder)   . Suicidal ideation   . Urine incontinence   . Vitamin D deficiency     Past Surgical History:  Procedure Laterality Date  . ABDOMINAL HYSTERECTOMY     sept 1996  . APPENDECTOMY    . BACK SURGERY  2008  . CARDIAC CATHETERIZATION     nov 1997  . CHOLECYSTECTOMY    . COLONOSCOPY  10/17/2002    Distal proctitis, small external hemorrhoids, otherwise/  normal colonoscopy. Suspect rectal bleeding secondary to hemorrhoids  . ESOPHAGOGASTRODUODENOSCOPY  03/18/09   fundic gland polyps/mild gastritis  . HERNIA REPAIR  1978  . JOINT REPLACEMENT     bil knee replacement  . KNEE ARTHROSCOPY    . MULTIPLE EXTRACTIONS WITH  ALVEOLOPLASTY N/A 08/16/2015   Procedure: EXTRACTION OF TEETH THREE, SIX, EIGHT, NINE, ELEVEN, FOURTEEN, FIFTEEN, TWENTY-EIGHT WITH ALVEOLOPLASTY;  Surgeon: Diona Browner, DDS;  Location: [place];  Service: Oral Surgery;  Laterality: N/A;  . TONSILLECTOMY    . TOTAL VAGINAL HYSTERECTOMY    . TUBAL LIGATION       Allergies  Allergies  Allergen Reactions  . Abilify [Aripiprazole] Other (See Comments)    hallucinations  . Naproxen Nausea And Vomiting and Swelling  . Ativan [Lorazepam] Other (See Comments)    Delirium  . Haldol [Haloperidol] Other (See Comments)    Hallucinating   . Hydroxyzine Other (See Comments)    hallucinations    Inpatient Medications    . atorvastatin  20 mg Oral Daily  . benztropine  1 mg  Oral QHS  . celecoxib  200 mg Oral BID  . cloNIDine  0.1 mg Oral QHS  . divalproex  1,500 mg Oral QHS  . DULoxetine  60 mg Oral Daily  . feeding supplement (ENSURE ENLIVE)  237 mL Oral BID BM  . levothyroxine  125 mcg Oral QAC breakfast  . loratadine  10 mg Oral Daily  . pantoprazole  40 mg Oral Daily  . risperidone  4 mg Oral QHS    Family History    Family History  Problem Relation Age of Onset  . Hypertension Mother   . Bipolar disorder Mother   . Dementia Mother   . Depression Mother   . Heart attack Mother   . AAA (abdominal aortic aneurysm) Mother   . Coronary artery disease Father   . Alcohol abuse Father   . Hypertension Brother   . Coronary artery disease Brother   . Bipolar disorder Brother   . Depression Brother   . Depression Sister   . Paranoid behavior Sister   . Cancer Sister        breast  . Bipolar disorder Sister   . Depression Sister   . Hypertension Sister   . Cancer Son        thyroid  . Cancer Maternal Aunt        breast metastatized to brain  . Anesthesia problems Neg Hx   . Hypotension Neg Hx   . Malignant hyperthermia Neg Hx   . Pseudochol deficiency Neg Hx    She indicated that her mother is deceased. She indicated that  her father is deceased. She indicated that both of her sisters are alive. She indicated that her brother is alive. She indicated that her maternal grandmother is deceased. She indicated that her maternal grandfather is deceased. She indicated that her paternal grandmother is deceased. She indicated that her paternal grandfather is deceased. She indicated that both of her sons are alive. She indicated that the status of her maternal aunt is unknown. She indicated that the status of her neg hx is unknown.   Social History    Social History   Socioeconomic History  . Marital status: Single    Spouse name: Not on file  . Number of children: 2  . Years of education: Not on file  . Highest education level: Not on file  Occupational History  . Occupation: Disabled    Fish farm manager: UNEMPLOYED    Comment: back problems  Social Needs  . Financial resource strain: Not on file  . Food insecurity:    Worry: Not on file    Inability: Not on file  . Transportation needs:    Medical: Not on file    Non-medical: Not on file  Tobacco Use  . Smoking status: Never Smoker  . Smokeless tobacco: Never Used  Substance and Sexual Activity  . Alcohol use: No    Alcohol/week: 0.0 standard drinks  . Drug use: No  . Sexual activity: Never    Birth control/protection: Abstinence  Lifestyle  . Physical activity:    Days per week: Not on file    Minutes per session: Not on file  . Stress: Not on file  Relationships  . Social connections:    Talks on phone: Not on file    Gets together: Not on file    Attends religious service: Not on file    Active member of club or organization: Not on file    Attends meetings of clubs or organizations:  Not on file    Relationship status: Not on file  . Intimate partner violence:    Fear of current or ex partner: Not on file    Emotionally abused: Not on file    Physically abused: Not on file    Forced sexual activity: Not on file  Other Topics Concern  . Not on  file  Social History Narrative   Allison Sharp is disabled and lives with her sister, Neesha. She has another sister with whom she has lived with in the past, and who is a Designer, jewellery, but not currently practicing.   She has two grown sons that she does not see often, as they live in other states. She has 5 grand children.   Allison Sharp has a long history of mental illness including depression, PTSD, suicidal and homicidal ideation.   She has been obese most all of her life. Her weight has significantly impacted her QOL. She recently lost 20 lbs. By decreasing portion size & increasing proteins.      Review of Systems    Review of Systems  Constitutional: Positive for chills. Negative for fever and weight loss.  HENT: Positive for congestion.   Respiratory: Positive for cough, sputum production and shortness of breath. Negative for hemoptysis.   Cardiovascular: Positive for chest pain, palpitations, orthopnea, leg swelling and PND.  Gastrointestinal: Positive for blood in stool, diarrhea and nausea. Negative for vomiting.  Genitourinary: Negative for hematuria.  Musculoskeletal: Negative for myalgias.  Neurological: Positive for dizziness and headaches. Negative for loss of consciousness.  Endo/Heme/Allergies: Does not bruise/bleed easily.  Psychiatric/Behavioral: Negative for substance abuse.    Physical Exam    Blood pressure 124/78, pulse 77, temperature 97.7 F (36.5 C), temperature source Oral, resp. rate 20, height 5\' 9"  (1.753 m), weight (!) 171.6 kg, last menstrual period 02/18/1995, SpO2 93 %.  General: 63 y.o. morbidly obese Caucasian female resting comfortably in no acute distress. Pleasant and cooperative. Currently on supplemental O2 via nasal cannula. HEENT: Normal  Neck: Supple. No carotid bruits. Difficult to assess JVD due to body habitus. Lungs: No increased work of breathing. Clear to auscultation bilaterally. No wheezes, rhonchi, or rales. Heart: RRR. Distinct S1 and  S2. No murmurs, gallops, or rubs.  Abdomen: Soft, obese, and non-tender to palpation. Bowel sounds present in all 4 quadrants.   Extremities: No significant pitting edema noted of lower extremities; however, difficult to tell due to body habitus. Radial pulses 1+ and equal bilaterally. Skin: Extremities cool (hands more than feet) and dry. Neuro: Alert and oriented x3. No focal deficits. Moves all extremities spontaneously. Psych: Normal affect. Responds appropriately.  Labs    Troponin [facility] of Care Test) Recent Labs    08/06/18 1512  TROPIPOC 0.00   Recent Labs    08/06/18 2145 08/07/18 0342  TROPONINI <0.03 <0.03   Lab Results  Component Value Date   WBC 7.5 08/07/2018   HGB 11.5 (L) 08/07/2018   HCT 36.5 08/07/2018   MCV 98.4 08/07/2018   PLT 86 (L) 08/07/2018    Recent Labs  Lab 08/06/18 1504  NA 140  K 3.8  CL 105  CO2 28  BUN 12  CREATININE 0.90  CALCIUM 9.1  PROT 6.2*  BILITOT 0.7  ALKPHOS 61  ALT 12  AST 19  GLUCOSE 105*   Lab Results  Component Value Date   CHOL 236 (H) 07/02/2015   HDL 39 (L) 07/02/2015   LDLCALC 161 (H) 07/02/2015   TRIG 181 (  H) 07/02/2015   Lab Results  Component Value Date   DDIMER 0.43 10/22/2017     Radiology Studies    Dg Chest 2 View  Result Date: 08/06/2018 CLINICAL DATA:  Acute chest pain for 1 week. EXAM: CHEST - 2 VIEW COMPARISON:  10/22/2017 and prior radiographs FINDINGS: The cardiomediastinal silhouette is unremarkable. Possible trace effusions on the LATERAL view noted. There is no evidence of focal airspace disease, pulmonary edema, suspicious pulmonary nodule/mass, or pneumothorax. No acute bony abnormalities are identified. IMPRESSION: Possible trace bilateral pleural effusions without other acute abnormality. Electronically Signed   By: Margarette Canada M.D.   On: 08/06/2018 16:38    EKG     EKG: EKG was personally reviewed and demonstrates: Normal sinus rhythm, rate 78 bpm, with no acute ischemic  changes.  Telemetry: Telemetry was personally reviewed and demonstrates: Sinus rhythm with heart rates in the 70's to 120's.  Cardiac Imaging    Echocardiogram 04/03/2016: Study Conclusions: - Left ventricle: The cavity size was normal. Wall thickness was   increased in a pattern of mild LVH. Systolic function was normal.   The estimated ejection fraction was in the range of 55% to 60%.   Although no diagnostic regional wall motion abnormality was   identified, this possibility cannot be completely excluded on the   basis of this study. Doppler parameters are consistent with   abnormal left ventricular relaxation (grade 1 diastolic   dysfunction). - Left atrium: The atrium was mildly dilated. _______________  Myoview 04/04/2016:  Nuclear stress EF: 63%. No wall motion abnormalities  Defect 1: There is a medium defect of mild severity present in the basal inferolateral, mid inferolateral and apex location. This defect is likely secondary to bowel loop attenuation seen on the raw images.  There was no ST segment deviation noted during stress.  This is a low risk study. No significant ischemia identified.  Assessment & Plan    Chest Pain Concerning for Unstable Angina - Patient presents with constant substernal chest tightness/heaviness over the last 3-4 days that improved with Nitroglycerin. She also reports some chest heaviness and shortness of breath with minimal exertion for the last year. Patient has a history of mild LAD disease on cardiac catheterization in 1997 and low risk Myoview in 2017.  - EKG showed normal sinus rhythm with no acute ischemic changes. - Troponin negative x3.  - BNP normal.  - Echo has been ordered. - Will check hemoglobin A1c and lipid panel. - Patient continues to have some chest heaviness this morning. - Primary team started IV Heparin. - Chest pain may be secondary to chronic respiratory conditions. However, her symptoms are also concerning for  unstable angina. Patient has multiple CV risk factors (HTN, HLD, pre-diabetes, obesity). Do not know how beneficial Myoview or coronary CT will be due to body habitus. Consider cardiac catheterization for further evaluation. Will discuss with MD.  Hypertension - BP currently well controlled. - Continue home Clonidine.  Hyperlipidemia - Will check lipid panel. - Currently on Lipitor 20mg  daily at home.   Of note, patient reports recent blood in her stools. Hemoglobin slightly low at 11.5. Will order hemoccult but will defer management to primary team.   Otherwise, per primary team.  Signed, Darreld Mclean, PA-C 08/07/2018, 7:12 AM  For questions or updates, please contact   Please consult [email or web address] for contact info under Cardiology/STEMI. ---------------------------------------------------------------------------------------------   History and all data above reviewed.  Patient examined.  I agree with the findings as above.  Allison Sharp presents with chest pain that has some exertional features as well as some atypical features.  Her chest discomfort worsens with exertion and is relieved by rest and nitroglycerin, however it has been somewhat constant as well over the past several days.  Her risk factors include significant secondhand smoking exposure though she is a personal never smoker, and morbid obesity.  She has previously had ischemic evaluations which have been unremarkable.   Constitutional: Significant shortness of breath with minimal exertion.  Morbidly obese. Cardiovascular: regular rhythm, normal rate, no murmurs. S1 and S2 normal. Radial pulses 2/4 bilaterally. Respiratory: clear to auscultation bilaterally GI : normal bowel sounds, soft and nontender. No distention.   MSK: extremities warm, well perfused.  Significant diffuse edema without pitting.  NEURO: grossly nonfocal exam, moves all extremities. PSYCH: alert and oriented x 3, normal mood and affect.    All available labs, radiology testing, previous records reviewed. Agree with documented assessment and plan of my colleague as stated above with the following additions or changes:  Principal Problem:   Chest pain Active Problems:   Hypothyroidism   Morbid obesity ([place])   GERD   Coronary artery disease   OSA on CPAP   Essential hypertension, benign   Hyperlipidemia LDL goal <130   Bipolar I disorder, most recent episode depressed, severe w psychosis ([place])   Chronic obstructive pulmonary disease ([place])   Prolonged QT interval    Plan: The patient and I have participated in shared decision making regarding her presentation to the hospital for chest pain and shortness of breath.  We recognize that this may be primarily contributed to by lung disease, however her symptoms are also concerning for angina.  It would be reasonable to consider a coronary angiogram with access through the radial artery for diagnostic purposes.  If this is normal, it is likely driven by her underlying lung disease.  The patient is in agreement and willing to proceed with coronary angiogram.  We will plan this for tomorrow.  In the absence of ACS, heparin can be discontinued.  INFORMED CONSENT: I have reviewed the risks, indications, and alternatives to cardiac catheterization, possible angioplasty, and stenting with the patient. Risks include but are not limited to bleeding, infection, vascular injury, stroke, myocardial infection, arrhythmia, kidney injury, radiation-related injury in the case of prolonged fluoroscopy use, emergency cardiac surgery, and death. The patient understands the risks of serious complication is 1-2 in 0630 with diagnostic cardiac cath and 1-2% or less with angioplasty/stenting.     Length of Stay:  LOS: 0 days   Elouise Munroe, MD [facility] 1:12 PM  08/07/2018

## 2018-08-07 NOTE — Progress Notes (Signed)
ANTICOAGULATION CONSULT NOTE - Follow Up Consult  Pharmacy Consult for Heparin Indication: chest pain/ACS  Allergies  Allergen Reactions  . Abilify [Aripiprazole] Other (See Comments)    hallucinations  . Naproxen Nausea And Vomiting and Swelling  . Ativan [Lorazepam] Other (See Comments)    Delirium  . Haldol [Haloperidol] Other (See Comments)    Hallucinating   . Hydroxyzine Other (See Comments)    hallucinations    Patient Measurements: Height: 5\' 9"  (175.3 cm) Weight: (!) 378 lb 4.8 oz (171.6 kg) IBW/kg (Calculated) : 66.2 Heparin Dosing Weight:   Vital Signs: Temp: 97.7 F (36.5 C) (03/10 2030) Temp Source: Oral (03/10 2030) BP: 123/78 (03/10 2107) Pulse Rate: 74 (03/10 2256)  Labs: Recent Labs    08/06/18 1504 08/06/18 1505 08/06/18 2145 08/06/18 2246  HGB  --  12.6  --   --   HCT  --  39.0  --   --   PLT  --  98*  --   --   APTT 28  --   --   --   LABPROT 12.5  --   --   --   INR 0.9  --   --   --   HEPARINUNFRC  --   --   --  0.23*  CREATININE 0.90  --   --   --   TROPONINI  --   --  <0.03  --     Estimated Creatinine Clearance: 108.1 mL/min (by C-G formula based on SCr of 0.9 mg/dL).   Medications:  Infusions:  . heparin 1,600 Units/hr (08/06/18 1648)    Assessment: Patient with low heparin level.  No heparin issues per RN.   Goal of Therapy:  Heparin level 0.3-0.7 units/ml Monitor platelets by anticoagulation protocol: Yes   Plan:  Increase heparin to 1750 units/hr Recheck level at 0900  Tyler Deis, Shea Stakes [place] 08/07/2018,12:08 AM

## 2018-08-07 NOTE — Progress Notes (Signed)
  Echocardiogram 2D Echocardiogram has been performed.  Allison Sharp 08/07/2018, 12:40 PM

## 2018-08-07 NOTE — Care Management Obs Status (Signed)
[facility] NOTIFICATION   Patient Details  Name: Allison Sharp MRN: [id number] Date of Birth: 1956-03-10   Medicare Observation Status Notification Given:  Yes    MahabirJuliann Pulse, RN 08/07/2018, 2:59 PM

## 2018-08-07 NOTE — Progress Notes (Signed)
PROGRESS NOTE                                                                                                                                                                                                             Patient Demographics:    Allison Sharp, is a 63 y.o. female, DOB - 1955/07/26, YQI:[id number]  Admit date - 08/06/2018   Admitting Physician Reubin Milan, MD  Outpatient Primary MD for the patient is Dettinger, Fransisca Kaufmann, MD  LOS - 0   Chief Complaint  Patient presents with  . Chest Pain       Brief Narrative    63 y.o. female with a history of CAD, hypertension, hyperlipidemia, COPD on home O2, obstructive sleep apnea on CPAP, hypothyroidism, chronic back pain, chronic headaches, bipolar disorder, depression, and anxiety who for complaints of chest pain, had negative troponin, no acute EKG changes, but was suspicious for unstable angina, so she was admitted to telemetry floor, started on heparin GTT, cardiology has been consulted .   Subjective:    Allison Sharp today has, No headache, reports some occasional chest pain since admission, reports dyspnea at baseline.     Assessment  & Plan :    Principal Problem:   Chest pain Active Problems:   Hypothyroidism   Morbid obesity (HCC)   GERD   Coronary artery disease   OSA on CPAP   Essential hypertension, benign   Hyperlipidemia LDL goal <130   Bipolar I disorder, most recent episode depressed, severe w psychosis (Allison Sharp)   Chronic obstructive pulmonary disease (HCC)   Prolonged QT interval    Chest pain -Cardiology input greatly appreciated, 2D echo pending, await final recommendation per cardiology -Some concerning features including chest pain on exertion, did resolve by nitro -Troponins has been negative -Follow on 2D echo  Coronary artery disease -Continue with aspirin, statin  Prolonged QT interval Magnesium sulfate 2 g IVPB.  Most recent QTC is 479 Avoid  medications that prolong QT interval if possible.  Hypothyroidism Continue levothyroxine 125 mcg p.o. daily. Monitor TSH as needed.    Morbid obesity ([place]) Needs significant lifestyle modifications.    GERD Pantoprazole 40 mg p.o. daily.    OSA on CPAP Continue CPAP at bedtime.    Essential hypertension, benign Continue clonidine 0.1 mg p.o. at bedtime.    Hyperlipidemia  LDL goal <130 Continue atorvastatin 20 mg p.o. daily.    Bipolar I disorder, most recent episode depressed, severe w psychosis (HCC) Continue Cymbalta 60 mg p.o. daily. Continue risperidone 2 mg p.o. daily PRN. Continue divalproex 1500 mg p.o. bedtime. Continue risperidone 4 mg p.o. at bedtime    Chronic obstructive pulmonary disease (HCC) Continue supplemental oxygen. Bronchodilators as needed.     Code Status : Full  Family Communication  : None at bedside  Disposition Plan  : home when stable   Barriers For Discharge : pending further work   Consults  :  Cardiology  Procedures  : none  DVT Prophylaxis  :  On Heparin GTT  Lab Results  Component Value Date   PLT 86 (L) 08/07/2018    Antibiotics  :    Anti-infectives (From admission, onward)   None        Objective:   Vitals:   08/06/18 2107 08/06/18 2256 08/07/18 0025 08/07/18 0418  BP: 123/78  105/66 124/78  Pulse: 76 74 75 77  Resp:  17 16 20   Temp:   97.9 F (36.6 C) 97.7 F (36.5 C)  TempSrc:   Oral Oral  SpO2:  99% 98% 93%  Weight:      Height:        Wt Readings from Last 3 Encounters:  08/06/18 (!) 171.6 kg  06/28/18 (!) 177.1 kg  05/03/18 (!) 174.5 kg     Intake/Output Summary (Last 24 hours) at 08/07/2018 1209 Last data filed at 08/07/2018 0944 Gross per 24 hour  Intake 484.43 ml  Output 700 ml  Net -215.57 ml     Physical Exam  Awake Alert, Oriented X 3, No new F.N deficits, Normal affect Symmetrical Chest wall movement, Good air movement bilaterally, CTAB RRR,No Gallops,Rubs or new  Murmurs, No Parasternal Heave +ve B.Sounds, Abd Soft, No tenderness, No rebound - guarding or rigidity. No Cyanosis, Clubbing or edema, No new Rash or bruise      Data Review:    CBC Recent Labs  Lab 08/06/18 1505 08/07/18 0342  WBC 6.2 7.5  HGB 12.6 11.5*  HCT 39.0 36.5  PLT 98* 86*  MCV 96.5 98.4  MCH 31.2 31.0  MCHC 32.3 31.5  RDW 12.8 12.8    Chemistries  Recent Labs  Lab 08/06/18 1504  NA 140  K 3.8  CL 105  CO2 28  GLUCOSE 105*  BUN 12  CREATININE 0.90  CALCIUM 9.1  MG 2.1  AST 19  ALT 12  ALKPHOS 61  BILITOT 0.7   ------------------------------------------------------------------------------------------------------------------ No results for input(s): CHOL, HDL, LDLCALC, TRIG, CHOLHDL, LDLDIRECT in the last 72 hours.  Lab Results  Component Value Date   HGBA1C 5.4 10/22/2017   ------------------------------------------------------------------------------------------------------------------ No results for input(s): TSH, T4TOTAL, T3FREE, THYROIDAB in the last 72 hours.  Invalid input(s): FREET3 ------------------------------------------------------------------------------------------------------------------ No results for input(s): VITAMINB12, FOLATE, FERRITIN, TIBC, IRON, RETICCTPCT in the last 72 hours.  Coagulation profile Recent Labs  Lab 08/06/18 1504  INR 0.9    No results for input(s): DDIMER in the last 72 hours.  Cardiac Enzymes Recent Labs  Lab 08/06/18 2145 08/07/18 0342  TROPONINI <0.03 <0.03   ------------------------------------------------------------------------------------------------------------------    Component Value Date/Time   BNP 23.9 08/06/2018 1504   BNP 5.3 12/03/2015 1010    Inpatient Medications  Scheduled Meds: . atorvastatin  20 mg Oral Daily  . benztropine  1 mg Oral QHS  . celecoxib  200 mg Oral BID  . cloNIDine  0.1 mg Oral QHS  . divalproex  1,500 mg Oral QHS  . DULoxetine  60 mg Oral Daily   . feeding supplement (ENSURE ENLIVE)  237 mL Oral BID BM  . levothyroxine  125 mcg Oral QAC breakfast  . loratadine  10 mg Oral Daily  . pantoprazole  40 mg Oral Daily  . risperidone  4 mg Oral QHS   Continuous Infusions: . heparin 1,750 Units/hr (08/07/18 0441)   PRN Meds:.acetaminophen **OR** acetaminophen, albuterol, nitroGLYCERIN, polyethylene glycol, prochlorperazine, promethazine, risperiDONE, traMADol  Micro Results No results found for this or any previous visit (from the past 240 hour(s)).  Radiology Reports Dg Chest 2 View  Result Date: 08/06/2018 CLINICAL DATA:  Acute chest pain for 1 week. EXAM: CHEST - 2 VIEW COMPARISON:  10/22/2017 and prior radiographs FINDINGS: The cardiomediastinal silhouette is unremarkable. Possible trace effusions on the LATERAL view noted. There is no evidence of focal airspace disease, pulmonary edema, suspicious pulmonary nodule/mass, or pneumothorax. No acute bony abnormalities are identified. IMPRESSION: Possible trace bilateral pleural effusions without other acute abnormality. Electronically Signed   By: Margarette Canada M.D.   On: 08/06/2018 16:38     Phillips Climes M.D on 08/07/2018 at 12:09 PM  Between 7am to 7pm - Pager - [phone]  After 7pm go to [email or web address] - password [facility]  [facility] -  Office  [phone]

## 2018-08-08 ENCOUNTER — Encounter (HOSPITAL_COMMUNITY)
Admission: EM | Disposition: A | Discharge status: Home / Self Care | Payer: Self-pay | Source: Home / Self Care | Attending: Emergency Medicine

## 2018-08-08 ENCOUNTER — Other Ambulatory Visit: Payer: Self-pay | Admitting: Family Medicine

## 2018-08-08 DIAGNOSIS — I200 Unstable angina: Secondary | ICD-10-CM | POA: Diagnosis not present

## 2018-08-08 DIAGNOSIS — G4733 Obstructive sleep apnea (adult) (pediatric): Secondary | ICD-10-CM | POA: Diagnosis not present

## 2018-08-08 DIAGNOSIS — R079 Chest pain, unspecified: Secondary | ICD-10-CM

## 2018-08-08 DIAGNOSIS — I25118 Atherosclerotic heart disease of native coronary artery with other forms of angina pectoris: Secondary | ICD-10-CM

## 2018-08-08 DIAGNOSIS — J4531 Mild persistent asthma with (acute) exacerbation: Secondary | ICD-10-CM

## 2018-08-08 DIAGNOSIS — Z9989 Dependence on other enabling machines and devices: Secondary | ICD-10-CM | POA: Diagnosis not present

## 2018-08-08 DIAGNOSIS — R9431 Abnormal electrocardiogram [ECG] [EKG]: Secondary | ICD-10-CM

## 2018-08-08 HISTORY — PX: LEFT HEART CATH AND CORONARY ANGIOGRAPHY: CATH118249

## 2018-08-08 LAB — LIPID PANEL
Cholesterol: 137 mg/dL (ref 0–200)
HDL: 32 mg/dL — AB (ref >40)
LDL Cholesterol: 82 mg/dL (ref 0–99)
Total CHOL/HDL Ratio: 4.3 RATIO
Triglycerides: 117 mg/dL (ref <150)
VLDL: 23 mg/dL (ref 0–40)

## 2018-08-08 LAB — BASIC METABOLIC PANEL
Anion gap: 6 (ref 5–15)
BUN: 12 mg/dL (ref 8–23)
CO2: 27 mmol/L (ref 22–32)
Calcium: 8.8 mg/dL — ABNORMAL LOW (ref 8.9–10.3)
Chloride: 108 mmol/L (ref 98–111)
Creatinine, Ser: 0.85 mg/dL (ref 0.44–1.00)
GFR calc Af Amer: >60 mL/min (ref >60)
GFR calc non Af Amer: >60 mL/min (ref >60)
Glucose, Bld: 95 mg/dL (ref 70–99)
Potassium: 3.7 mmol/L (ref 3.5–5.1)
Sodium: 141 mmol/L (ref 135–145)

## 2018-08-08 LAB — HEMOGLOBIN A1C
Hgb A1c MFr Bld: 5.3 % (ref 4.8–5.6)
Mean Plasma Glucose: 105.41 mg/dL

## 2018-08-08 LAB — CBC
HEMATOCRIT: 35.9 % — AB (ref 36.0–46.0)
Hemoglobin: 11.3 g/dL — ABNORMAL LOW (ref 12.0–15.0)
MCH: 31.1 pg (ref 26.0–34.0)
MCHC: 31.5 g/dL (ref 30.0–36.0)
MCV: 98.9 fL (ref 80.0–100.0)
Platelets: 98 10*3/uL — ABNORMAL LOW (ref 150–400)
RBC: 3.63 MIL/uL — ABNORMAL LOW (ref 3.87–5.11)
RDW: 12.7 % (ref 11.5–15.5)
WBC: 5.5 10*3/uL (ref 4.0–10.5)
nRBC: 0 % (ref 0.0–0.2)

## 2018-08-08 SURGERY — LEFT HEART CATH AND CORONARY ANGIOGRAPHY
Anesthesia: LOCAL

## 2018-08-08 MED ORDER — FENTANYL CITRATE (PF) 100 MCG/2ML IJ SOLN
INTRAMUSCULAR | Status: DC | PRN
  Administered 2018-08-08: 25 ug via INTRAVENOUS

## 2018-08-08 MED ORDER — HEPARIN (PORCINE) IN NACL 1000-0.9 UT/500ML-% IV SOLN
INTRAVENOUS | Status: DC | PRN
  Administered 2018-08-08: 500 mL

## 2018-08-08 MED ORDER — MIDAZOLAM HCL 2 MG/2ML IJ SOLN
INTRAMUSCULAR | Status: DC | PRN
  Administered 2018-08-08: 2 mg via INTRAVENOUS

## 2018-08-08 MED ORDER — FENTANYL CITRATE (PF) 100 MCG/2ML IJ SOLN
INTRAMUSCULAR | Status: AC
  Filled 2018-08-08: qty 2

## 2018-08-08 MED ORDER — VERAPAMIL HCL 2.5 MG/ML IV SOLN
INTRAVENOUS | Status: AC
  Filled 2018-08-08: qty 2

## 2018-08-08 MED ORDER — LIDOCAINE HCL (PF) 1 % IJ SOLN
INTRAMUSCULAR | Status: DC | PRN
  Administered 2018-08-08: 2 mL

## 2018-08-08 MED ORDER — ONDANSETRON HCL 4 MG/2ML IJ SOLN: 4.0000 mg | Freq: Four times a day (QID) | INTRAMUSCULAR | Status: DC | PRN

## 2018-08-08 MED ORDER — SODIUM CHLORIDE 0.9% FLUSH: 3.0000 mL | INTRAVENOUS | Status: DC | PRN

## 2018-08-08 MED ORDER — ACETAMINOPHEN 325 MG PO TABS: 650.0000 mg | ORAL_TABLET | ORAL | Status: DC | PRN

## 2018-08-08 MED ORDER — HEPARIN (PORCINE) IN NACL 1000-0.9 UT/500ML-% IV SOLN
INTRAVENOUS | Status: AC
  Filled 2018-08-08: qty 1000

## 2018-08-08 MED ORDER — IOHEXOL 350 MG/ML SOLN
INTRAVENOUS | Status: DC | PRN
  Administered 2018-08-08: 50 mL via INTRA_ARTERIAL

## 2018-08-08 MED ORDER — MIDAZOLAM HCL 2 MG/2ML IJ SOLN
INTRAMUSCULAR | Status: AC
  Filled 2018-08-08: qty 2

## 2018-08-08 MED ORDER — SODIUM CHLORIDE 0.9% FLUSH
3.0000 mL | Freq: Two times a day (BID) | INTRAVENOUS | Status: DC
  Administered 2018-08-08: 3 mL via INTRAVENOUS

## 2018-08-08 MED ORDER — HEPARIN SODIUM (PORCINE) 1000 UNIT/ML IJ SOLN
INTRAMUSCULAR | Status: DC | PRN
  Administered 2018-08-08: 6000 [IU] via INTRAVENOUS

## 2018-08-08 MED ORDER — SODIUM CHLORIDE 0.9 % IV SOLN: 250.0000 mL | INTRAVENOUS | Status: DC | PRN

## 2018-08-08 MED ORDER — HEPARIN SODIUM (PORCINE) 1000 UNIT/ML IJ SOLN
INTRAMUSCULAR | Status: AC
  Filled 2018-08-08: qty 1

## 2018-08-08 MED ORDER — VERAPAMIL HCL 2.5 MG/ML IV SOLN
INTRAVENOUS | Status: DC | PRN
  Administered 2018-08-08: 10 mL via INTRA_ARTERIAL

## 2018-08-08 MED ORDER — LIDOCAINE HCL (PF) 1 % IJ SOLN
INTRAMUSCULAR | Status: AC
  Filled 2018-08-08: qty 30

## 2018-08-08 MED ORDER — SODIUM CHLORIDE 0.9 % IV SOLN: INTRAVENOUS | Status: AC

## 2018-08-08 MED ORDER — CELECOXIB 200 MG PO CAPS: ORAL_CAPSULE | ORAL | 0 refills | Status: DC

## 2018-08-08 SURGICAL SUPPLY — 13 items
BRACE RADIAL COMPRESSION RADST (HEMOSTASIS) ×1 IMPLANT
CATH 5FR JL3.5 JR4 ANG PIG MP (CATHETERS) ×1 IMPLANT
DEVICE RAD COMP TR BAND LRG (VASCULAR PRODUCTS) ×1 IMPLANT
GLIDESHEATH SLEND SS 6F .021 (SHEATH) ×1 IMPLANT
GUIDEWIRE INQWIRE 1.5J.035X260 (WIRE) IMPLANT
HOVERMATT SINGLE USE (MISCELLANEOUS) ×1 IMPLANT
INQWIRE 1.5J .035X260CM (WIRE) ×2
KIT HEART LEFT (KITS) ×2 IMPLANT
PACK CARDIAC CATHETERIZATION (CUSTOM PROCEDURE TRAY) ×2 IMPLANT
SHEATH PROBE COVER 6X72 (BAG) ×1 IMPLANT
TRANSDUCER W/STOPCOCK (MISCELLANEOUS) ×2 IMPLANT
TUBING CIL FLEX 10 FLL-RA (TUBING) ×2 IMPLANT
WIRE HI TORQ VERSACORE-J 145CM (WIRE) ×1 IMPLANT

## 2018-08-08 NOTE — Discharge Summary (Signed)
Allison Sharp, is a 63 y.o. female  DOB 1955-10-24  MRN [id number].  Admission date:  08/06/2018  Admitting Physician  Reubin Milan, MD  Discharge Date:  08/08/2018   Primary MD  Dettinger, Fransisca Kaufmann, MD  Recommendations for primary care physician for things to follow:  -Please check CBC, BMP during next visit   Admission Diagnosis  chest pain   Discharge Diagnosis  chest pain   Principal Problem:   Chest pain Active Problems:   Hypothyroidism   Morbid obesity ([place])   GERD   Coronary artery disease   OSA on CPAP   Essential hypertension, benign   Hyperlipidemia LDL goal <130   Bipolar I disorder, most recent episode depressed, severe w psychosis ([place])   Chronic obstructive pulmonary disease ([place])   Prolonged QT interval      Past Medical History:  Diagnosis Date  . Allergy   . Anxiety   . Arthritis   . Asthma   . Bipolar 1 disorder ([place])   . CAD (coronary artery disease)   . Cellulitis   . CHF (congestive heart failure) (HCC)    diastolic dysfunction  . Chronic back pain   . Chronic headaches   . Complication of anesthesia    States she typically gets sick s/p anesthesia  . Contusion of sacrum   . COPD (chronic obstructive pulmonary disease) ([place])   . Depression   . Dyspnea    PFT 03/05/09 FEV1 2.77(98%), FVC 3.25(86%), FEV1% 85, TLC 5.88(99%), DLCO 60% ,  Methacholine challenge 03/16/09 normal ,  CT chest 03/12/09 no pulmonary disease  . Fungal infection   . GERD (gastroesophageal reflux disease)   . History of colonoscopy 10/17/2002   by Dr Rehman-> distal non-specific proctitis, small ext hemorrhoids,   . HTN (hypertension)   . Hyperlipidemia   . Hyperthyroidism   . Hypothyroidism    States she only has hyperthyroidism  . Migraine headache   . Morbid obesity with body mass index of 50.0-59.9 in adult [facility]) JAN 2011 370 LBS   2004 311 BMI 45.9  . Myocardial  infarction ([place])    NOV 1997  . OSA on CPAP    she had been on 2L O2 at night but that was stopped  . PONV (postoperative nausea and vomiting)   . PTSD (post-traumatic stress disorder)   . Suicidal ideation   . Urine incontinence   . Vitamin D deficiency     Past Surgical History:  Procedure Laterality Date  . ABDOMINAL HYSTERECTOMY     sept 1996  . APPENDECTOMY    . BACK SURGERY  2008  . CARDIAC CATHETERIZATION     nov 1997  . CHOLECYSTECTOMY    . COLONOSCOPY  10/17/2002    Distal proctitis, small external hemorrhoids, otherwise/  normal colonoscopy. Suspect rectal bleeding secondary to hemorrhoids  . ESOPHAGOGASTRODUODENOSCOPY  03/18/09   fundic gland polyps/mild gastritis  . HERNIA REPAIR  1978  . JOINT REPLACEMENT     bil knee replacement  . KNEE  ARTHROSCOPY    . MULTIPLE EXTRACTIONS WITH ALVEOLOPLASTY N/A 08/16/2015   Procedure: EXTRACTION OF TEETH THREE, SIX, EIGHT, NINE, ELEVEN, FOURTEEN, FIFTEEN, TWENTY-EIGHT WITH ALVEOLOPLASTY;  Surgeon: Diona Browner, DDS;  Location: [place];  Service: Oral Surgery;  Laterality: N/A;  . TONSILLECTOMY    . TOTAL VAGINAL HYSTERECTOMY    . TUBAL LIGATION         History of present illness and  Hospital Course:     Kindly see H&P for history of present illness and admission details, please review complete Labs, Consult reports and Test reports for all details in brief  HPI  from the history and physical done on the day of admission 08/06/2018  HPI: EDYE Sharp is a 63 y.o. female with medical history significant of seasonal allergies, anxiety, osteoarthritis, asthma/COPD, bipolar 1 disorder, CAD, history of MI, diastolic CHF, chronic back pain, chronic headaches, contusion of sacrum history, depression history of fungal infection, GERD, hypertension, hyperlipidemia, hypothyroidism/hypothyroidism OSA on CPAP, PTSD, history of suicidal ideation, history of urinary incontinence, vitamin D deficiency who is coming to the emergency  department with complaints of chest pain for the past 4 days.  She states that for the past few months she has been getting mild chest pressure while exerting and they improve after rest.  However, she states for the last 4 days the pain has become more intense.  It is pressure-like, radiated to her left cervical area associated with dyspnea, palpitations, mild nausea and diaphoresis.  It worsens with exertion.  The pain is relieved with rest and oxygen.  Lately she has been using an increased oxygen requirement after she spoke to her sister who is a Designer, jewellery.  However, this did not improve her symptoms significantly.  She denies PND, orthopnea, pitting edema of the lower extremities, but has chronic lymphedema.  She denies fever, chills sore throat, rhinorrhea, abdominal pain, diarrhea, melena or hematochezia.  She occasionally gets constipated.  No dysuria, frequency or hematuria.  No polyuria, polydipsia, polyphagia or blurred vision.  ED Course: Initial vital signs temperature 98.2 F, pulse 77, respirations 16, blood pressure 142/90 mmHg and O2 sat 100% on nasal cannula oxygen.  She received aspirin and sublingual nitroglycerin in the emergency department, which relieved her pain significantly.  She was started on heparin infusion.  The case was discussed with cardiology on-call (Dr. Debara Pickett) who recommended admission to the hospitalist service and cardiology consultation in a.m.  CBC showed platelets of 98, but was otherwise normal with a white count of 6.2 and hemoglobin of 12.6 g/dL.  PT, PTT and INR within normal limits.  CMP shows a glucose of 105 mg/dL and total protein of 6.2 g/dL.  Other values are within normal limits.  BNP was 23.9 pg/mL.  Troponin x2 so far has been negative.  EKG shows sinus rhythm with VPC, borderline repolarization abnormality and prolonged QT interval.Her two-view chest radiograph showed possible trace bilateral pleural effusions without any other acute abnormality.   Please see images and full radiology report for further detail.   Hospital Course   63 y.o.femalewith a history of CAD, hypertension, hyperlipidemia, COPDon home O2, obstructive sleep apnea on CPAP,hypothyroidism, chronic back pain, chronic headaches, bipolar disorder, depression, and anxietywho for complaints of chest pain, had negative troponin, no acute EKG changes, but was suspicious for unstable angina, so she was admitted to telemetry floor, started on heparin GTT, cardiology has been consulted .  Her cardiac enzymes were negative, plan drip has been stopped, she went for  cardiac cath at [facility] 08/08/2018, with no obstructive CAD,   Chest pain -Cardiology input greatly appreciated, 2D echo with a preserved EF, no regional wall motion abnormalities, troponins are negative, she was started empirically on heparin drip in ED and concern of unstable angina, it was stopped by cardiology as it was felt her chest pain noncardiac, but given her inconclusive stress test in the past, they recommended cardiac cath, was performed today at [facility], which was significant for no obstructive CAD, I have discussed with cardiology, patient can be discharged with no further adjustment of her cardiac medication, and to follow with cardiology as an outpatient .  Prolonged QT interval See with IV magnesium sulfate.  Most recent QTC is 479 Avoid medications that prolong QT interval if possible.  Hypothyroidism Continue levothyroxine 125 mcg p.o. daily. Monitor TSH as needed.  Morbid obesity ([place]) Needs significant lifestyle modifications.  GERD Pantoprazole 40 mg p.o. daily.  OSA on CPAP Continue CPAP at bedtime.  Essential hypertension, benign Continue clonidine 0.1 mg p.o. at bedtime.  Hyperlipidemia LDL goal <130 Continue atorvastatin 20 mg p.o. daily.  Bipolar I disorder, most recent episode depressed, severe w psychosis (HCC) Continue Cymbalta 60 mg  p.o. daily. Continue risperidone 2 mg p.o. daily PRN. Continue divalproex 1500 mg p.o. bedtime. Continue risperidone 4 mg p.o. at bedtime  Chronic obstructive pulmonary disease (HCC) Continue supplemental oxygen. Bronchodilators as needed.    Discharge Condition:  Stable  Follow UP  Follow-up Information    Dettinger, Fransisca Kaufmann, MD Follow up in 1 week(s).   Specialties:  Family Medicine, Cardiology Contact information: [address] [place] [id number] [phone]             Discharge Instructions  and  Discharge Medications     Discharge Instructions    Discharge instructions   Complete by:  As directed    Follow with Primary MD Dettinger, Fransisca Kaufmann, MD in 7 days   Get CBC, CMP,checked  by Primary MD next visit.    Activity: As tolerated with Full fall precautions use walker/cane & assistance as needed   Disposition Home    Diet: Heart Healthy  , with feeding assistance and aspiration precautions.  For Heart failure patients - Check your Weight same time everyday, if you gain over 2 pounds, or you develop in leg swelling, experience more shortness of breath or chest pain, call your Primary MD immediately. Follow Cardiac Low Salt Diet and 1.5 lit/day fluid restriction.   On your next visit with your primary care physician please Get Medicines reviewed and adjusted.   Please request your Prim.MD to go over all Hospital Tests and Procedure/Radiological results at the follow up, please get all Hospital records sent to your Prim MD by signing hospital release before you go home.   If you experience worsening of your admission symptoms, develop shortness of breath, life threatening emergency, suicidal or homicidal thoughts you must seek medical attention immediately by calling 911 or calling your MD immediately  if symptoms less severe.  You Must read complete instructions/literature along with all the possible adverse reactions/side effects for all the  Medicines you take and that have been prescribed to you. Take any new Medicines after you have completely understood and accpet all the possible adverse reactions/side effects.   Do not drive, operating heavy machinery, perform activities at heights, swimming or participation in water activities or provide baby sitting services if your were admitted for syncope or siezures until you  have seen by Primary MD or a Neurologist and advised to do so again.  Do not drive when taking Pain medications.    Do not take more than prescribed Pain, Sleep and Anxiety Medications  Special Instructions: If you have smoked or chewed Tobacco  in the last 2 yrs please stop smoking, stop any regular Alcohol  and or any Recreational drug use.  Wear Seat belts while driving.   Please note  You were cared for by a hospitalist during your hospital stay. If you have any questions about your discharge medications or the care you received while you were in the hospital after you are discharged, you can call the unit and asked to speak with the hospitalist on call if the hospitalist that took care of you is not available. Once you are discharged, your primary care physician will handle any further medical issues. Please note that NO REFILLS for any discharge medications will be authorized once you are discharged, as it is imperative that you return to your primary care physician (or establish a relationship with a primary care physician if you do not have one) for your aftercare needs so that they can reassess your need for medications and monitor your lab values.   Increase activity slowly   Complete by:  As directed      Allergies as of 08/08/2018      Reactions   Abilify [aripiprazole] Other (See Comments)   hallucinations   Naproxen Nausea And Vomiting, Swelling   Ativan [lorazepam] Other (See Comments)   Delirium   Haldol [haloperidol] Other (See Comments)   Hallucinating    Hydroxyzine Other (See Comments)    hallucinations      Medication List    STOP taking these medications   cephALEXin 500 MG capsule Commonly known as:  KEFLEX     TAKE these medications   albuterol (2.5 MG/3ML) 0.083% nebulizer solution Commonly known as:  PROVENTIL Take 3 mLs (2.5 mg total) by nebulization every 4 (four) hours as needed for wheezing or shortness of breath.   albuterol 108 (90 Base) MCG/ACT inhaler Commonly known as:  ProAir HFA USE 2 PUFF EVERY 4 HOURS AS NEEDED FOR WHEEZING OR SHORTNESS OF BREATH   atorvastatin 20 MG tablet Commonly known as:  LIPITOR TAKE 1 TABLET DAILY   benztropine 1 MG tablet Commonly known as:  COGENTIN Take 1 tablet (1 mg total) by mouth at bedtime.   Breo Ellipta 100-25 MCG/INH Aepb Generic drug:  fluticasone furoate-vilanterol INHALE 1 PUFF INTO THE LUNGS DAILY   celecoxib 200 MG capsule Commonly known as:  CELEBREX Hold for next 3 days, then resume 08/12/2018 Start taking on:  August 12, 2018 What changed:    See the new instructions.  These instructions start on August 12, 2018. If you are unsure what to do until then, ask your doctor or other care provider.   cetirizine 10 MG tablet Commonly known as:  ZYRTEC TAKE 1 TABLET ONCE DAILY   cloNIDine 0.1 MG tablet Commonly known as:  CATAPRES TAKE ONE TABLET AT BEDTIME   diclofenac sodium 1 % Gel Commonly known as:  Voltaren Apply to affected area twice daily as needed What changed:    how much to take  how to take this  when to take this  reasons to take this  additional instructions   divalproex 500 MG DR tablet Commonly known as:  DEPAKOTE Take 3 tablets (1,500 mg total) by mouth at bedtime.   DULoxetine 60 MG  capsule Commonly known as:  CYMBALTA Take 1 capsule (60 mg total) by mouth daily.   fluticasone 50 MCG/ACT nasal spray Commonly known as:  FLONASE Place 1 spray 2 (two) times daily as needed into both nostrils for allergies or rhinitis.   levothyroxine 125 MCG tablet Commonly  known as:  SYNTHROID, LEVOTHROID TAKE (1) TABLET DAILY BE- FORE BREAKFAST. What changed:  See the new instructions.   nystatin cream Commonly known as:  MYCOSTATIN APPLY TO THE AFFECTED AREA THREE TIMES DAILY AS NEEDED .Marland KitchenFOR TOPICAL USE ONLY... What changed:  See the new instructions.   omeprazole 20 MG capsule Commonly known as:  PRILOSEC TAKE (1) CAPSULE DAILY What changed:  See the new instructions.   ondansetron 4 MG disintegrating tablet Commonly known as:  ZOFRAN-ODT DISSOLVE 1 TABLET IN MOUTH EVERY 8 HOURS AS NEEDED FOR NAUSEA AND VOMITING What changed:  See the new instructions.   polyethylene glycol powder powder Commonly known as:  GLYCOLAX/MIRALAX Take 17 g by mouth 2 (two) times daily as needed. What changed:  reasons to take this   promethazine 25 MG tablet Commonly known as:  PHENERGAN Take 1 tablet (25 mg total) by mouth every 8 (eight) hours as needed for nausea or vomiting.   risperiDONE 2 MG tablet Commonly known as:  RisperDAL Take 1 tablet (2 mg total) by mouth daily as needed. For auditory hallucintations   risperidone 4 MG tablet Commonly known as:  RISPERDAL Take 1 tablet (4 mg total) by mouth at bedtime.   therapeutic multivitamin-minerals tablet Take 1 tablet by mouth daily.   traMADol 50 MG tablet Commonly known as:  ULTRAM Take 1 tablet (50 mg total) by mouth every 8 (eight) hours as needed for moderate pain.         Diet and Activity recommendation: See Discharge Instructions above   Consults obtained - Cardiology   Major procedures and Radiology Reports - PLEASE review detailed and final reports for all details, in brief -  Cardiac cath 08/08/2018 by Dr. Emeterio Reeve  Mid LAD lesion is 10% stenosed.  The left ventricular systolic function is normal.  LV end diastolic pressure is normal.  The left ventricular ejection fraction is 55-65% by visual estimate.  There is no aortic valve stenosis.   No significant CAD.  Dg Chest 2  View  Result Date: 08/06/2018 CLINICAL DATA:  Acute chest pain for 1 week. EXAM: CHEST - 2 VIEW COMPARISON:  10/22/2017 and prior radiographs FINDINGS: The cardiomediastinal silhouette is unremarkable. Possible trace effusions on the LATERAL view noted. There is no evidence of focal airspace disease, pulmonary edema, suspicious pulmonary nodule/mass, or pneumothorax. No acute bony abnormalities are identified. IMPRESSION: Possible trace bilateral pleural effusions without other acute abnormality. Electronically Signed   By: Margarette Canada M.D.   On: 08/06/2018 16:38    Micro Results     No results found for this or any previous visit (from the past 240 hour(s)).     Today   Subjective:   Garnell Begeman today has no headache,no chest or abdominal pain,no new weakness tingling or numbness, feels much better wants to go home today.   Objective:   Blood pressure (!) 138/94, pulse 71, temperature 97.8 F (36.6 C), temperature source Oral, resp. rate 17, height 5\' 9"  (1.753 m), weight (!) 171.6 kg, last menstrual period 02/18/1995, SpO2 98 %.   Intake/Output Summary (Last 24 hours) at 08/08/2018 1537 Last data filed at 08/08/2018 1513 Gross per 24 hour  Intake 1360 ml  Output 1100 ml  Net 260 ml    Exam Awake Alert, Oriented x 3, No new F.N deficits, Normal affect Symmetrical Chest wall movement, Good air movement bilaterally, CTAB RRR,No Gallops,Rubs or new Murmurs, No Parasternal Heave +ve B.Sounds, Abd Soft, Non tender,  No rebound -guarding or rigidity. No Cyanosis, Clubbing or edema, No new Rash or bruise  Data Review   CBC w Diff:  Lab Results  Component Value Date   WBC 5.5 08/08/2018   HGB 11.3 (L) 08/08/2018   HGB 11.9 02/21/2018   HGB 14.8 01/04/2011   HCT 35.9 (L) 08/08/2018   HCT 36.1 02/21/2018   HCT 42.6 01/04/2011   PLT 98 (L) 08/08/2018   PLT 126 (L) 02/21/2018   LYMPHOPCT 29 01/25/2018   LYMPHOPCT 39.8 01/04/2011   MONOPCT 11 01/25/2018   MONOPCT 8.1  01/04/2011   EOSPCT 2 01/25/2018   EOSPCT 3.4 01/04/2011   BASOPCT 0 01/25/2018   BASOPCT 0.3 01/04/2011    CMP:  Lab Results  Component Value Date   NA 141 08/08/2018   NA 145 (H) 02/21/2018   K 3.7 08/08/2018   CL 108 08/08/2018   CO2 27 08/08/2018   BUN 12 08/08/2018   BUN 13 02/21/2018   CREATININE 0.85 08/08/2018   CREATININE 0.83 12/03/2015   PROT 6.2 (L) 08/06/2018   PROT 6.2 02/06/2018   ALBUMIN 3.6 08/06/2018   ALBUMIN 3.8 02/06/2018   BILITOT 0.7 08/06/2018   BILITOT 0.4 02/06/2018   ALKPHOS 61 08/06/2018   AST 19 08/06/2018   ALT 12 08/06/2018  .   Total Time in preparing paper work, data evaluation and todays exam - 36 minutes  Phillips Climes M.D on 08/08/2018 at 3:37 PM  [facility]   Office  [phone]

## 2018-08-08 NOTE — Discharge Instructions (Signed)
Follow with Primary MD Dettinger, Fransisca Kaufmann, MD in 7 days   Get CBC, CMP,checked  by Primary MD next visit.    Activity: As tolerated with Full fall precautions use walker/cane & assistance as needed   Disposition Home    Diet: Heart Healthy  , with feeding assistance and aspiration precautions.  For Heart failure patients - Check your Weight same time everyday, if you gain over 2 pounds, or you develop in leg swelling, experience more shortness of breath or chest pain, call your Primary MD immediately. Follow Cardiac Low Salt Diet and 1.5 lit/day fluid restriction.   On your next visit with your primary care physician please Get Medicines reviewed and adjusted.   Please request your Prim.MD to go over all Hospital Tests and Procedure/Radiological results at the follow up, please get all Hospital records sent to your Prim MD by signing hospital release before you go home.   If you experience worsening of your admission symptoms, develop shortness of breath, life threatening emergency, suicidal or homicidal thoughts you must seek medical attention immediately by calling 911 or calling your MD immediately  if symptoms less severe.  You Must read complete instructions/literature along with all the possible adverse reactions/side effects for all the Medicines you take and that have been prescribed to you. Take any new Medicines after you have completely understood and accpet all the possible adverse reactions/side effects.   Do not drive, operating heavy machinery, perform activities at heights, swimming or participation in water activities or provide baby sitting services if your were admitted for syncope or siezures until you have seen by Primary MD or a Neurologist and advised to do so again.  Do not drive when taking Pain medications.    Do not take more than prescribed Pain, Sleep and Anxiety Medications  Special Instructions: If you have smoked or chewed Tobacco  in the last 2 yrs  please stop smoking, stop any regular Alcohol  and or any Recreational drug use.  Wear Seat belts while driving.   Please note  You were cared for by a hospitalist during your hospital stay. If you have any questions about your discharge medications or the care you received while you were in the hospital after you are discharged, you can call the unit and asked to speak with the hospitalist on call if the hospitalist that took care of you is not available. Once you are discharged, your primary care physician will handle any further medical issues. Please note that NO REFILLS for any discharge medications will be authorized once you are discharged, as it is imperative that you return to your primary care physician (or establish a relationship with a primary care physician if you do not have one) for your aftercare needs so that they can reassess your need for medications and monitor your lab values.

## 2018-08-08 NOTE — Progress Notes (Signed)
Radstat BAND REMOVAL  LOCATION:    right radial  DEFLATED PER PROTOCOL:    Yes.    TIME BAND OFF / DRESSING APPLIED:    1315pm Gauze and tegaderm applied   SITE UPON ARRIVAL:    Level 0  SITE AFTER BAND REMOVAL:    Level 0  CIRCULATION SENSATION AND MOVEMENT:    Within Normal Limits   Yes.    COMMENTS:   Care instruction given to pt .  No complaints of any discomfort at site.

## 2018-08-08 NOTE — Progress Notes (Signed)
[facility] was notified that the patient has a cardiac catheterization scheduled for 0900. [facility] will pick up the patient for transport to [facility]  between [phone].

## 2018-08-08 NOTE — Progress Notes (Signed)
No obstructive CAD on cath.  No change to cardiovascular medical therapy. Follow up with cardiology as needed.  Cardiology will sign off.  Discussed with Dr. Waldron Labs.   Allison Sharp 1:43 PM 08/08/18

## 2018-08-08 NOTE — Interval H&P Note (Signed)
Cath Lab Visit (complete for each Cath Lab visit)  Clinical Evaluation Leading to the Procedure:   ACS: Yes.    Non-ACS:    Anginal Classification: CCS IV  Anti-ischemic medical therapy: Minimal Therapy (1 class of medications)  Non-Invasive Test Results: No non-invasive testing performed  Prior CABG: No previous CABG      History and Physical Interval Note:  08/08/2018 10:14 AM  Allison Sharp  has presented today for surgery, with the diagnosis of Unstable Angina.  The various methods of treatment have been discussed with the patient and family. After consideration of risks, benefits and other options for treatment, the patient has consented to  Procedure(s): LEFT HEART CATH AND CORONARY ANGIOGRAPHY (N/A) as a surgical intervention.  The patient's history has been reviewed, patient examined, no change in status, stable for surgery.  I have reviewed the patient's chart and labs.  Questions were answered to the patient's satisfaction.     Larae Grooms

## 2018-08-08 NOTE — Progress Notes (Signed)
Patient discharged per MD order.  All vital signs stable.  All patient belongings taken by patient and niece.  Transported by nurse tech from room via wheelchair on oxygen; oxygen supply changed to patient tank at exit and hospital oxygen tank returned to stock.

## 2018-08-08 NOTE — Progress Notes (Signed)
Updated reported called to Child psychotherapist at [facility]. Pt back to [facility] via [facility]

## 2018-08-09 ENCOUNTER — Encounter (HOSPITAL_COMMUNITY): Payer: Self-pay | Admitting: Interventional Cardiology

## 2018-08-16 ENCOUNTER — Other Ambulatory Visit: Payer: Self-pay | Admitting: Family Medicine

## 2018-08-16 ENCOUNTER — Other Ambulatory Visit (HOSPITAL_COMMUNITY): Payer: Self-pay | Admitting: Psychiatry

## 2018-08-16 DIAGNOSIS — F3160 Bipolar disorder, current episode mixed, unspecified: Secondary | ICD-10-CM

## 2018-08-22 DIAGNOSIS — I951 Orthostatic hypotension: Secondary | ICD-10-CM | POA: Diagnosis not present

## 2018-08-22 DIAGNOSIS — G4733 Obstructive sleep apnea (adult) (pediatric): Secondary | ICD-10-CM | POA: Diagnosis not present

## 2018-08-22 DIAGNOSIS — R2681 Unsteadiness on feet: Secondary | ICD-10-CM | POA: Diagnosis not present

## 2018-08-22 DIAGNOSIS — J449 Chronic obstructive pulmonary disease, unspecified: Secondary | ICD-10-CM | POA: Diagnosis not present

## 2018-08-26 ENCOUNTER — Other Ambulatory Visit (HOSPITAL_COMMUNITY): Payer: Self-pay

## 2018-08-26 DIAGNOSIS — F3160 Bipolar disorder, current episode mixed, unspecified: Secondary | ICD-10-CM

## 2018-08-26 MED ORDER — BENZTROPINE MESYLATE 1 MG PO TABS: 1.0000 mg | ORAL_TABLET | Freq: Every day | ORAL | 0 refills | Status: DC

## 2018-08-27 ENCOUNTER — Other Ambulatory Visit: Payer: Self-pay | Admitting: Family Medicine

## 2018-08-27 ENCOUNTER — Other Ambulatory Visit (HOSPITAL_COMMUNITY): Payer: Self-pay

## 2018-08-27 DIAGNOSIS — F3160 Bipolar disorder, current episode mixed, unspecified: Secondary | ICD-10-CM

## 2018-08-27 MED ORDER — RISPERIDONE 4 MG PO TABS: 4.0000 mg | ORAL_TABLET | Freq: Every day | ORAL | 0 refills | Status: DC

## 2018-08-27 MED ORDER — DIVALPROEX SODIUM 500 MG PO DR TAB: 1500.0000 mg | DELAYED_RELEASE_TABLET | Freq: Every day | ORAL | 0 refills | Status: DC

## 2018-08-27 MED ORDER — DULOXETINE HCL 60 MG PO CPEP: 60.0000 mg | ORAL_CAPSULE | Freq: Every day | ORAL | 0 refills | Status: DC

## 2018-08-28 ENCOUNTER — Other Ambulatory Visit: Payer: Self-pay

## 2018-08-28 ENCOUNTER — Ambulatory Visit (INDEPENDENT_AMBULATORY_CARE_PROVIDER_SITE_OTHER): Payer: Medicare Other | Admitting: Psychiatry

## 2018-08-28 DIAGNOSIS — F411 Generalized anxiety disorder: Secondary | ICD-10-CM | POA: Diagnosis not present

## 2018-08-28 DIAGNOSIS — F3160 Bipolar disorder, current episode mixed, unspecified: Secondary | ICD-10-CM

## 2018-08-28 MED ORDER — RISPERIDONE 2 MG PO TABS: 2.0000 mg | ORAL_TABLET | Freq: Every day | ORAL | 2 refills | Status: DC | PRN

## 2018-08-28 NOTE — Progress Notes (Signed)
Virtual Visit via Telephone Note  I connected with Allison Sharp on 08/28/18 at  1:00 PM EDT by telephone and verified that I am speaking with the correct person using two identifiers.   I discussed the limitations, risks, security and privacy concerns of performing an evaluation and management service by telephone and the availability of in person appointments. I also discussed with the patient that there may be a patient responsible charge related to this service. The patient expressed understanding and agreed to proceed.   History of Present Illness: Patient was seen through telephone session.  She recently hospitalized on the medical floor because of chest pain.  She is slowly recovering but reported that her anxiety is increased.  Her niece reported that she gets sometimes confused with the timing.  Since discharge from the hospital she has not left the home due to pandemic coronavirus.  Due to multiple health issues she is a high risk and taking extra precaution to even going outside to sit on the porch.  Patient told that she is sleeping on and off.  She gets irritable but denies any paranoia, hallucination or any suicidal thoughts.  She is taking her medication as prescribed.  She is taking Risperdal 4 mg and has not taken Risperdal 2 mg which was recommended to take if she gets upset or having hallucination.  Though she denies any hallucination so she has not taken the Risperdal.  However I recommended that she should take extra Risperdal half to 1 tablet of 2 mg if needed for irritability and mood swings.  She reported no side effects.  She had appointment to see neurology on April 22.  Her cognition remains the same.  She easily forgetful.  She keep asking the question multiple times and sometimes needs assistant and help from her knees to answer the question.  However she knows her medication which she is getting from [name].  She does not want to change the medication at this time.  Her niece  reported that she is not aggressive and violent.  Her niece believe it could be due to situation as she has not left the home since discharge from the hospital.  Her appetite is okay.  She is eating and there is no other major concern or issue.  She denies any chest pain since left the hospital.     Past Psychiatric History: Reviewed. H/O psychiatric illness with multiple inpatient treatment. Last admissions in August-September 2019 at [facility]. H/O suicidal attempt by taking overdose. Diagnosed with PTSD, depression, bipolar disorder. Tried Paxil, Prozac, Wellbutrin, Effexor, Lexapro, amitriptyline, Cymbalta, Abilify (Tremors), Neurontin, trazodone, Thorazine, hydroxyzine, Luvox, Sinequan, Zyprexa and Latuda. Best responded on Risperdal and Cymbalta.   Recent Results (from the past 2160 hour(s))  Urinalysis, Complete     Status: Abnormal   Collection Time: 06/28/18  9:19 AM  Result Value Ref Range   Specific Gravity, UA >1.030 (H) 1.005 - 1.030   pH, UA 5.5 5.0 - 7.5   Color, UA Yellow Yellow   Appearance Ur Clear Clear   Leukocytes, UA Negative Negative   Protein, UA Negative Negative/Trace   Glucose, UA Negative Negative   Ketones, UA Trace (A) Negative   RBC, UA Negative Negative   Bilirubin, UA Negative Negative   Urobilinogen, Ur 1.0 0.2 - 1.0 mg/dL   Nitrite, UA Negative Negative   Microscopic Examination See below:   Microscopic Examination     Status: Abnormal   Collection Time: 06/28/18  9:19 AM  Result Value Ref Range   WBC, UA 6-10 (A) 0 - 5 /hpf   RBC, UA 0-2 0 - 2 /hpf   Epithelial Cells (non renal) 0-10 0 - 10 /hpf   Renal Epithel, UA None seen None seen /hpf   Bacteria, UA Few (A) None seen/Few   Yeast, UA Present None seen  Urine Culture     Status: None   Collection Time: 06/28/18 11:21 AM  Result Value Ref Range   Urine Culture, Routine Final report    Organism ID, Bacteria Comment     Comment: Culture shows less than 10,000 colony forming units of  bacteria per milliliter of urine. This colony count is not generally considered to be clinically significant.   Brain natriuretic peptide     Status: None   Collection Time: 08/06/18  3:04 PM  Result Value Ref Range   B Natriuretic Peptide 23.9 0.0 - 100.0 pg/mL    Comment: Performed at [facility], [place] [address], [place], [place] [address]  Protime-INR     Status: None   Collection Time: 08/06/18  3:04 PM  Result Value Ref Range   Prothrombin Time 12.5 11.4 - 15.2 seconds   INR 0.9 0.8 - 1.2    Comment: (NOTE) INR goal varies based on device and disease states. Performed at [facility], [place] [address], [place], [place] [address]   APTT     Status: None   Collection Time: 08/06/18  3:04 PM  Result Value Ref Range   aPTT 28 24 - 36 seconds    Comment: Performed at [facility], [place] [address] [place], [place] [address]  Comprehensive metabolic panel     Status: Abnormal   Collection Time: 08/06/18  3:04 PM  Result Value Ref Range   Sodium 140 135 - 145 mmol/L   Potassium 3.8 3.5 - 5.1 mmol/L   Chloride 105 98 - 111 mmol/L   CO2 28 22 - 32 mmol/L   Glucose, Bld 105 (H) 70 - 99 mg/dL   BUN 12 8 - 23 mg/dL   Creatinine, Ser 0.90 0.44 - 1.00 mg/dL   Calcium 9.1 8.9 - 10.3 mg/dL   Total Protein 6.2 (L) 6.5 - 8.1 g/dL   Albumin 3.6 3.5 - 5.0 g/dL   AST 19 15 - 41 U/L   ALT 12 0 - 44 U/L   Alkaline Phosphatase 61 38 - 126 U/L   Total Bilirubin 0.7 0.3 - 1.2 mg/dL   GFR calc non Af Amer >60 >60 mL/min   GFR calc Af Amer >60 >60 mL/min   Anion gap 7 5 - 15    Comment: Performed at [facility], [place] [address], [place], [place] [address]  Magnesium     Status: None   Collection Time: 08/06/18  3:04 PM  Result Value Ref Range   Magnesium 2.1 1.7 - 2.4 mg/dL    Comment: Performed at [facility], [place] [address], [place], [place] [address]  CBC     Status: Abnormal   Collection  Time: 08/06/18  3:05 PM  Result Value Ref Range   WBC 6.2 4.0 - 10.5 K/uL   RBC 4.04 3.87 - 5.11 MIL/uL   Hemoglobin 12.6 12.0 - 15.0 g/dL   HCT 39.0 36.0 - 46.0 %   MCV 96.5 80.0 - 100.0 fL   MCH 31.2 26.0 - 34.0 pg   MCHC 32.3 30.0 - 36.0 g/dL   RDW  12.8 11.5 - 15.5 %   Platelets 98 (L) 150 - 400 K/uL    Comment: REPEATED TO VERIFY PLATELET COUNT CONFIRMED BY SMEAR SPECIMEN CHECKED FOR CLOTS Immature Platelet Fraction may be clinically indicated, consider ordering this additional test [id number]    nRBC 0.0 0.0 - 0.2 %    Comment: Performed at [facility], [place] [address] [place], [place] [address]  I-stat troponin, ED     Status: None   Collection Time: 08/06/18  3:12 PM  Result Value Ref Range   Troponin i, poc 0.00 0.00 - 0.08 ng/mL   Comment 3            Comment: Due to the release kinetics of cTnI, a negative result within the first hours of the onset of symptoms does not rule out myocardial infarction with certainty. If myocardial infarction is still suspected, repeat the test at appropriate intervals.   CBG monitoring, ED     Status: None   Collection Time: 08/06/18  4:16 PM  Result Value Ref Range   Glucose-Capillary 82 70 - 99 mg/dL  Troponin I - Now Then Q6H     Status: None   Collection Time: 08/06/18  9:45 PM  Result Value Ref Range   Troponin I <0.03 <0.03 ng/mL    Comment: Performed at [facility], [place] [address] [place], [place] [address]  Heparin level (unfractionated)     Status: Abnormal   Collection Time: 08/06/18 10:46 PM  Result Value Ref Range   Heparin Unfractionated 0.23 (L) 0.30 - 0.70 IU/mL    Comment: (NOTE) If heparin results are below expected values, and patient dosage has  been confirmed, suggest follow up testing of antithrombin III levels. Performed at [facility], [place] [address] [place] [address] [place], [place] [address]   CBC     Status: Abnormal   Collection Time: 08/07/18  3:42 AM   Result Value Ref Range   WBC 7.5 4.0 - 10.5 K/uL   RBC 3.71 (L) 3.87 - 5.11 MIL/uL   Hemoglobin 11.5 (L) 12.0 - 15.0 g/dL   HCT 36.5 36.0 - 46.0 %   MCV 98.4 80.0 - 100.0 fL   MCH 31.0 26.0 - 34.0 pg   MCHC 31.5 30.0 - 36.0 g/dL   RDW 12.8 11.5 - 15.5 %   Platelets 86 (L) 150 - 400 K/uL    Comment: Immature Platelet Fraction may be clinically indicated, consider ordering this additional test [id number] CONSISTENT WITH PREVIOUS RESULT    nRBC 0.0 0.0 - 0.2 %    Comment: Performed at [facility], [place] [address] [place], [place] [address]  Troponin I - Now Then Q6H     Status: None   Collection Time: 08/07/18  3:42 AM  Result Value Ref Range   Troponin I <0.03 <0.03 ng/mL    Comment: Performed at [facility], [place] [address], [place], [place] [address]  Heparin level (unfractionated)     Status: None   Collection Time: 08/07/18  9:02 AM  Result Value Ref Range   Heparin Unfractionated 0.43 0.30 - 0.70 IU/mL    Comment: (NOTE) If heparin results are below expected values, and patient dosage has  been confirmed, suggest follow up testing of antithrombin III levels. Performed at [facility], [place] [address], [place], [place] [address]   ECHOCARDIOGRAM COMPLETE     Status: None   Collection Time: 08/07/18 12:40 PM  Result Value Ref Range  Weight 6,052.8 oz   Height 69 in   BP 124/78 mmHg  Occult blood card to lab, stool     Status: None   Collection Time: 08/07/18  3:50 PM  Result Value Ref Range   Fecal Occult Bld NEGATIVE NEGATIVE    Comment: Performed at [facility], [place] [address], [place], [place] [address]  Hemoglobin A1c     Status: None   Collection Time: 08/08/18  5:48 AM  Result Value Ref Range   Hgb A1c MFr Bld 5.3 4.8 - 5.6 %    Comment: (NOTE) Pre diabetes:          5.7%-6.4% Diabetes:              >6.4% Glycemic control for   <7.0% adults with diabetes    Mean Plasma Glucose  105.41 mg/dL    Comment: Performed at [place] [address] [place], [place] [address]  Lipid panel     Status: Abnormal   Collection Time: 08/08/18  5:48 AM  Result Value Ref Range   Cholesterol 137 0 - 200 mg/dL   Triglycerides 117 <150 mg/dL   HDL 32 (L) >40 mg/dL   Total CHOL/HDL Ratio 4.3 RATIO   VLDL 23 0 - 40 mg/dL   LDL Cholesterol 82 0 - 99 mg/dL    Comment:        Total Cholesterol/HDL:CHD Risk Coronary Heart Disease Risk Table                     Men   Women  1/2 Average Risk   3.4   3.3  Average Risk       5.0   4.4  2 X Average Risk   9.6   7.1  3 X Average Risk  23.4   11.0        Use the calculated Patient Ratio above and the CHD Risk Table to determine the patient's CHD Risk.        ATP III CLASSIFICATION (LDL):  <100     mg/dL   Optimal  100-129  mg/dL   Near or Above                    Optimal  130-159  mg/dL   Borderline  160-189  mg/dL   High  >190     mg/dL   Very High Performed at [place] [address], [place], [place] [address]   Basic metabolic panel     Status: Abnormal   Collection Time: 08/08/18  5:48 AM  Result Value Ref Range   Sodium 141 135 - 145 mmol/L   Potassium 3.7 3.5 - 5.1 mmol/L   Chloride 108 98 - 111 mmol/L   CO2 27 22 - 32 mmol/L   Glucose, Bld 95 70 - 99 mg/dL   BUN 12 8 - 23 mg/dL   Creatinine, Ser 0.85 0.44 - 1.00 mg/dL   Calcium 8.8 (L) 8.9 - 10.3 mg/dL   GFR calc non Af Amer >60 >60 mL/min   GFR calc Af Amer >60 >60 mL/min   Anion gap 6 5 - 15    Comment: Performed at [facility], [place] [address], [place], [place] [address]  CBC     Status: Abnormal   Collection Time: 08/08/18  5:48 AM  Result Value Ref Range   WBC 5.5 4.0 - 10.5 K/uL   RBC 3.63 (L) 3.87 -  5.11 MIL/uL   Hemoglobin 11.3 (L) 12.0 - 15.0 g/dL   HCT 35.9 (L) 36.0 - 46.0 %   MCV 98.9 80.0 - 100.0 fL   MCH 31.1 26.0 - 34.0 pg   MCHC 31.5 30.0 - 36.0 g/dL   RDW 12.7 11.5 - 15.5 %   Platelets 98  (L) 150 - 400 K/uL    Comment: REPEATED TO VERIFY Immature Platelet Fraction may be clinically indicated, consider ordering this additional test [id number] CONSISTENT WITH PREVIOUS RESULT    nRBC 0.0 0.0 - 0.2 %    Comment: Performed at [facility], [place] [address] [place], [place] [address]   Observations/Objective: Limited mental status examination done on the phone.  Patient describes her mood anxious.  Her thought process is circumstantial.  Her speech is slow with normal tone and volume.  There were no delusions, paranoia or any active or passive suicidal thoughts.  She appears to get distracted on phone conversation.  Her cognition is limited.  She keep asking the question and need help from her knees to answer those questions.  She is alert and oriented x2.  Her insight and judgment is fair.  Assessment and Plan: Bipolar disorder type I.  Generalized anxiety disorder.  I reviewed blood work results and current medication.  Her hemoglobin A1c is normal.  Her last Depakote level was within therapeutic range which was done September.  Patient reported no side effects.  I recommend to try extra 2 mg Risperdal to take half to 1 tablet if she continues to have mood lability and irritable.  She will continue Risperdal 4 mg at bedtime, Cogentin 1 mg at bedtime and Cymbalta 60 mg daily.  Discussed safety concern that anytime having active suicidal thoughts or homicidal thought that she need to call 911 or go to local emergency room.  I will see her again in 3 months and we will consider Depakote level on that visit.  Follow Up Instructions:    I discussed the assessment and treatment plan with the patient. The patient was provided an opportunity to ask questions and all were answered. The patient agreed with the plan and demonstrated an understanding of the instructions.   The patient was advised to call back or seek an in-person evaluation if the symptoms worsen or if the  condition fails to improve as anticipated.  I provided 20 minutes of non-face-to-face time during this encounter.   Kathlee Nations, MD

## 2018-09-16 ENCOUNTER — Ambulatory Visit (INDEPENDENT_AMBULATORY_CARE_PROVIDER_SITE_OTHER): Payer: Medicare Other | Admitting: Neurology

## 2018-09-16 ENCOUNTER — Other Ambulatory Visit: Payer: Self-pay

## 2018-09-16 DIAGNOSIS — R413 Other amnesia: Secondary | ICD-10-CM | POA: Diagnosis not present

## 2018-09-16 NOTE — Progress Notes (Signed)
PATIENT: Allison Sharp DOB: [birth year]  Virtual Visit via Video  I connected with Allison Sharp on 09/16/18 at  by video and verified that I am speaking with the correct person using two identifiers.   I discussed the limitations, risks, security and privacy concerns of performing an evaluation and management service by video and the availability of in person appointments. I also discussed with the patient that there may be a patient responsible charge related to this service. The patient expressed understanding and agreed to proceed.  HISTORICAL  Allison Sharp is a 63 year old female, seen in request by Dr. Warrick Parisian, Fransisca Kaufmann, for evaluation of memory loss, she lives with her niece [name], which is also present during interview.  I have reviewed and summarized the referring note from the referring physician.  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  Despite starting oxygen treatment since May 2019, there was no significant improvement in her memory,  There was no family history of dementia  Observations/Objective: I have reviewed problem lists, medications, allergies.  I personally reviewed CT head without contrast, generalized atrophy, supratentorium small vessel disease. CT of cervical spine, multilevel degenerative changes no acute fracture,  Laboratory evaluations in 2020, hemoglobin of 11.3, normal BMP, lipid profile, LDL of 82, A1c of 5.3  She is obese,  sitting in sofa with home oxygen on, oriented to her name, but not age, year, or month,  Assessment and Plan: Memory loss  In the setting of multiple medical condition, hypertension, obesity, hyperlipidemia, bipolar disorder, long-term polypharmacy treatment, sedentary lifestyle, COPD, home oxygen dependent,  MRI of the brain to rule out structural lesion  B12, TSH  Advised her and maybe try to mobilize patient as much as possible,  Follow Up Instructions:  In 6 months    I discussed the assessment and treatment plan with the patient. The patient was provided an opportunity to ask questions and all were answered. The patient agreed with the plan and demonstrated an understanding of the instructions.   The patient was advised to call back or seek an in-person evaluation if the symptoms worsen or if the condition fails to improve as anticipated.  I provided 30 minutes of non-face-to-face time during this encounter.  REVIEW OF SYSTEMS: Full 14 system review of systems performed and notable only for as above All other review of systems were negative.  ALLERGIES: Allergies  Allergen Reactions  . Abilify [Aripiprazole] Other (See Comments)    hallucinations  . Naproxen Nausea And Vomiting and Swelling  . Ativan [Lorazepam] Other (See Comments)    Delirium  . Haldol [Haloperidol] Other (See Comments)    Hallucinating   . Hydroxyzine Other (See Comments)    hallucinations    HOME MEDICATIONS: Current Outpatient Medications  Medication Sig Dispense Refill  . albuterol (PROAIR HFA) 108 (90 Base) MCG/ACT inhaler USE 2 PUFF EVERY 4 HOURS AS NEEDED FOR WHEEZING OR SHORTNESS OF BREATH 8.5 g 5  . albuterol (  PROVENTIL) (2.5 MG/3ML) 0.083% nebulizer solution Take 3 mLs (2.5 mg total) by nebulization every 4 (four) hours as needed for wheezing or shortness of breath. 75 mL 0  . atorvastatin (LIPITOR) 20 MG tablet TAKE 1 TABLET DAILY (Patient taking differently: Take 20 mg by mouth daily. ) 90  tablet 0  . benztropine (COGENTIN) 1 MG tablet Take 1 tablet (1 mg total) by mouth at bedtime. 90 tablet 0  . BREO ELLIPTA 100-25 MCG/INH AEPB INHALE 1 PUFF INTO THE LUNGS DAILY 60 each 0  . celecoxib (CELEBREX) 200 MG capsule TAKE (1) CAPSULE TWICE DAILY. 60 capsule 0  . cetirizine (ZYRTEC) 10 MG tablet TAKE 1 TABLET ONCE DAILY 30 tablet 0  . cloNIDine (CATAPRES) 0.1 MG tablet TAKE ONE TABLET AT BEDTIME 30 tablet 5  . diclofenac sodium (VOLTAREN) 1 % GEL Apply to affected area twice daily as needed (Patient taking differently: Apply 2 g topically 2 (two) times daily as needed (PAIN). ) 100 g 5  . divalproex (DEPAKOTE) 500 MG DR tablet Take 3 tablets (1,500 mg total) by mouth at bedtime. 270 tablet 0  . DULoxetine (CYMBALTA) 60 MG capsule Take 1 capsule (60 mg total) by mouth daily. 90 capsule 0  . fluticasone (FLONASE) 50 MCG/ACT nasal spray Place 1 spray 2 (two) times daily as needed into both nostrils for allergies or rhinitis. 16 g 6  . levothyroxine (SYNTHROID, LEVOTHROID) 125 MCG tablet TAKE (1) TABLET DAILY BE- FORE BREAKFAST. (Patient taking differently: Take 125 mcg by mouth daily before breakfast. ) 30 tablet 11  . nystatin cream (MYCOSTATIN) APPLY TO THE AFFECTED AREA THREE TIMES DAILY AS NEEDED .Marland KitchenFOR TOPICAL USE ONLY... (Patient taking differently: Apply 1 application topically 3 (three) times daily as needed for dry skin. ) 30 g 0  . omeprazole (PRILOSEC) 20 MG capsule TAKE (1) CAPSULE DAILY 30 capsule 0  . ondansetron (ZOFRAN-ODT) 4 MG disintegrating tablet DISSOLVE 1 TABLET IN MOUTH EVERY 8 HOURS AS NEEDED FOR NAUSEA AND VOMITING (Patient taking differently: Take 4 mg by mouth every 8 (eight) hours as needed for nausea or vomiting. ) 20 tablet 0  . polyethylene glycol powder (GLYCOLAX/MIRALAX) powder Take 17 g by mouth 2 (two) times daily as needed. (Patient taking differently: Take 17 g by mouth 2 (two) times daily as needed for moderate constipation. ) 3350 g 1  . promethazine  (PHENERGAN) 25 MG tablet Take 1 tablet (25 mg total) by mouth every 8 (eight) hours as needed for nausea or vomiting. 20 tablet 0  . risperiDONE (RISPERDAL) 2 MG tablet Take 1 tablet (2 mg total) by mouth daily as needed. For auditory hallucintations 30 tablet 2  . risperidone (RISPERDAL) 4 MG tablet Take 1 tablet (4 mg total) by mouth at bedtime. 90 tablet 0  . therapeutic multivitamin-minerals (THERAGRAN-M) tablet Take 1 tablet by mouth daily.    . traMADol (ULTRAM) 50 MG tablet Take 1 tablet (50 mg total) by mouth every 8 (eight) hours as needed for moderate pain. 90 tablet 2   No current facility-administered medications for this visit.     PAST MEDICAL HISTORY: Past Medical History:  Diagnosis Date  . Allergy   . Anxiety   . Arthritis   . Asthma   . Bipolar 1 disorder ([place])   . CAD (coronary artery disease)   . Cellulitis   . CHF (congestive heart failure) ([facility])    diastolic dysfunction  . Chronic back pain   . Chronic headaches   . Complication of  anesthesia    States she typically gets sick s/p anesthesia  . Contusion of sacrum   . COPD (chronic obstructive pulmonary disease) ([place])   . Depression   . Dyspnea    PFT 03/05/09 FEV1 2.77(98%), FVC 3.25(86%), FEV1% 85, TLC 5.88(99%), DLCO 60% ,  Methacholine challenge 03/16/09 normal ,  CT chest 03/12/09 no pulmonary disease  . Fungal infection   . GERD (gastroesophageal reflux disease)   . History of colonoscopy 10/17/2002   by Dr Rehman-> distal non-specific proctitis, small ext hemorrhoids,   . HTN (hypertension)   . Hyperlipidemia   . Hyperthyroidism   . Hypothyroidism    States she only has hyperthyroidism  . Migraine headache   . Morbid obesity with body mass index of 50.0-59.9 in adult [facility]) JAN 2011 370 LBS   2004 311 BMI 45.9  . Myocardial infarction ([place])    NOV 1997  . OSA on CPAP    she had been on 2L O2 at night but that was stopped  . PONV (postoperative nausea and vomiting)   . PTSD (post-traumatic  stress disorder)   . Suicidal ideation   . Urine incontinence   . Vitamin D deficiency     PAST SURGICAL HISTORY: Past Surgical History:  Procedure Laterality Date  . ABDOMINAL HYSTERECTOMY     sept 1996  . APPENDECTOMY    . BACK SURGERY  2008  . CARDIAC CATHETERIZATION     nov 1997  . CHOLECYSTECTOMY    . COLONOSCOPY  10/17/2002    Distal proctitis, small external hemorrhoids, otherwise/  normal colonoscopy. Suspect rectal bleeding secondary to hemorrhoids  . ESOPHAGOGASTRODUODENOSCOPY  03/18/09   fundic gland polyps/mild gastritis  . HERNIA REPAIR  1978  . JOINT REPLACEMENT     bil knee replacement  . KNEE ARTHROSCOPY    . LEFT HEART CATH AND CORONARY ANGIOGRAPHY N/A 08/08/2018   Procedure: LEFT HEART CATH AND CORONARY ANGIOGRAPHY;  Surgeon: Jettie Booze, MD;  Location: [facility];  Service: Cardiovascular;  Laterality: N/A;  . MULTIPLE EXTRACTIONS WITH ALVEOLOPLASTY N/A 08/16/2015   Procedure: EXTRACTION OF TEETH THREE, SIX, EIGHT, NINE, ELEVEN, FOURTEEN, FIFTEEN, TWENTY-EIGHT WITH ALVEOLOPLASTY;  Surgeon: Diona Browner, DDS;  Location: [facility];  Service: Oral Surgery;  Laterality: N/A;  . TONSILLECTOMY    . TOTAL VAGINAL HYSTERECTOMY    . TUBAL LIGATION      FAMILY HISTORY: Family History  Problem Relation Age of Onset  . Hypertension Mother   . Bipolar disorder Mother   . Dementia Mother   . Depression Mother   . Heart attack Mother   . AAA (abdominal aortic aneurysm) Mother   . Coronary artery disease Father   . Alcohol abuse Father   . Hypertension Brother   . Coronary artery disease Brother   . Bipolar disorder Brother   . Depression Brother   . Depression Sister   . Paranoid behavior Sister   . Cancer Sister        breast  . Bipolar disorder Sister   . Depression Sister   . Hypertension Sister   . Cancer Son        thyroid  . Cancer Maternal Aunt        breast metastatized to brain  . Anesthesia problems Neg Hx   . Hypotension Neg Hx    . Malignant hyperthermia Neg Hx   . Pseudochol deficiency Neg Hx     SOCIAL HISTORY:   Social History   Socioeconomic History  .  Marital status: Single    Spouse name: Not on file  . Number of children: 2  . Years of education: Not on file  . Highest education level: Not on file  Occupational History  . Occupation: Disabled    Fish farm manager: UNEMPLOYED    Comment: back problems  Social Needs  . Financial resource strain: Not on file  . Food insecurity:    Worry: Not on file    Inability: Not on file  . Transportation needs:    Medical: Not on file    Non-medical: Not on file  Tobacco Use  . Smoking status: Never Smoker  . Smokeless tobacco: Never Used  Substance and Sexual Activity  . Alcohol use: No    Alcohol/week: 0.0 standard drinks  . Drug use: No  . Sexual activity: Never    Birth control/protection: Abstinence  Lifestyle  . Physical activity:    Days per week: Not on file    Minutes per session: Not on file  . Stress: Not on file  Relationships  . Social connections:    Talks on phone: Not on file    Gets together: Not on file    Attends religious service: Not on file    Active member of club or organization: Not on file    Attends meetings of clubs or organizations: Not on file    Relationship status: Not on file  . Intimate partner violence:    Fear of current or ex partner: Not on file    Emotionally abused: Not on file    Physically abused: Not on file    Forced sexual activity: Not on file  Other Topics Concern  . Not on file  Social History Narrative   [name] is disabled and lives with her sister, Rasa. She has another sister with whom she has lived with in the past, and who is a Designer, jewellery, but not currently practicing.   She has two grown sons that she does not see often, as they live in other states. She has 5 grand children.   [name] has a long history of mental illness including depression, PTSD, suicidal and homicidal ideation.    She has been obese most all of her life. Her weight has significantly impacted her QOL. She recently lost 20 lbs. By decreasing portion size & increasing proteins.     Marcial Pacas, M.D. Ph.D.  [facility] [address] [place], [place] [address] Ph: [phone] Fax: [phone]  CC: Dettinger, Fransisca Kaufmann, MD

## 2018-09-17 ENCOUNTER — Encounter: Payer: Medicare Other | Admitting: *Deleted

## 2018-09-17 ENCOUNTER — Encounter: Payer: Self-pay | Admitting: Neurology

## 2018-09-17 ENCOUNTER — Telehealth: Payer: Self-pay | Admitting: Neurology

## 2018-09-17 NOTE — Telephone Encounter (Signed)
[employer] medicare order sent to [facility]. No auth they will reach out to the pt to schedule.  °

## 2018-09-25 ENCOUNTER — Other Ambulatory Visit: Payer: Self-pay | Admitting: Family Medicine

## 2018-09-25 ENCOUNTER — Other Ambulatory Visit: Payer: Self-pay

## 2018-09-25 ENCOUNTER — Other Ambulatory Visit (INDEPENDENT_AMBULATORY_CARE_PROVIDER_SITE_OTHER): Payer: Self-pay

## 2018-09-25 DIAGNOSIS — R413 Other amnesia: Secondary | ICD-10-CM | POA: Diagnosis not present

## 2018-09-25 DIAGNOSIS — Z0289 Encounter for other administrative examinations: Secondary | ICD-10-CM

## 2018-09-26 ENCOUNTER — Telehealth: Payer: Self-pay | Admitting: Neurology

## 2018-09-26 LAB — THYROID PANEL WITH TSH
Free Thyroxine Index: 1.9 (ref 1.2–4.9)
T3 Uptake Ratio: 22 % — ABNORMAL LOW (ref 24–39)
T4, Total: 8.5 ug/dL (ref 4.5–12.0)
TSH: 6.83 u[IU]/mL — ABNORMAL HIGH (ref 0.450–4.500)

## 2018-09-26 LAB — VITAMIN B12: Vitamin B-12: 370 pg/mL (ref 232–1245)

## 2018-09-26 NOTE — Telephone Encounter (Signed)
Please call patient, laboratory evaluation showed normal B12,  Elevated TSH, with decreased the T3 uptake ratio, suggestive of hypothyroidism, I have forward lab results to her primary care physician, she should contact him for management of her hypothyroidism, levothyroxine supplement

## 2018-09-26 NOTE — Telephone Encounter (Signed)
Spoke to her niece, [name], who takes care of her aunt.  She is aware of the lab results and will follow up with the patient's PCP to further discuss her Synthroid dosage.

## 2018-09-30 ENCOUNTER — Ambulatory Visit (INDEPENDENT_AMBULATORY_CARE_PROVIDER_SITE_OTHER): Payer: Medicare Other | Admitting: *Deleted

## 2018-09-30 ENCOUNTER — Other Ambulatory Visit: Payer: Self-pay

## 2018-09-30 ENCOUNTER — Telehealth: Payer: Self-pay | Admitting: *Deleted

## 2018-09-30 VITALS — BP 125/83 | HR 94 | Ht 69.0 in | Wt 390.0 lb

## 2018-09-30 DIAGNOSIS — Z0000 Encounter for general adult medical examination without abnormal findings: Secondary | ICD-10-CM | POA: Diagnosis not present

## 2018-09-30 NOTE — Telephone Encounter (Signed)
I agree and I did see the labs, I just assume she was going to set up an appointment, make sure she goes ahead and set up an appointment with me

## 2018-09-30 NOTE — Telephone Encounter (Signed)
DR Krista Blue, neurology ordered some labs on this pt while she was seeing her and evaluating her memory.  She drew a thyroid and it is abnormal - but Dr Krista Blue would like Dr Dettinger to address the level and make changes to meds.    Please address / labs are in epic.  Currently taking levothyroxine 125 mcg QD

## 2018-09-30 NOTE — Progress Notes (Signed)
[place] VISIT  09/30/2018  Telephone Visit Disclaimer This Medicare AWV was conducted by telephone due to national recommendations for restrictions regarding the COVID-19 Pandemic (e.g. social distancing).  I verified, using two identifiers, that I am speaking with Allison Sharp or their authorized healthcare agent. I discussed the limitations, risks, security, and privacy concerns of performing an evaluation and management service by telephone and the potential availability of an in-person appointment in the future. The patient expressed understanding and agreed to proceed.   Subjective:  Allison Sharp is a 63 y.o. female patient of Dettinger, Fransisca Kaufmann, MD who had a Medicare Annual Wellness Visit today via telephone. Allison Sharp is Disabled and lives with her niece and her family. she has 2 children. she reports that she is socially active and does interact with friends/family regularly. she is not physically active and enjoys music and crafts.  Patient Care Team: Dettinger, Fransisca Kaufmann, MD as PCP - General (Family Medicine) Fay Records, MD as PCP - Cardiology (Cardiology) Adele Schilder, Arlyce Harman, MD (Psychiatry) Danie Binder, MD (Gastroenterology) Fay Records, MD as Consulting Physician (Cardiology) Michel Bickers, MD as Consulting Physician (Infectious Diseases) Harlen Labs, MD as Referring Physician (Optometry) Marcial Pacas, MD as Consulting Physician (Neurology)  Advanced Directives 09/30/2018 08/06/2018 08/06/2018 01/25/2018 01/25/2018 01/23/2018 01/23/2018  Does Patient Have a Medical Advance Directive? No No No No No No No  Type of Advance Directive - - - - - - -  Does patient want to make changes to medical advance directive? - - - - - - -  Copy of [place] in Chart? - - - - - - -  Would patient like information on creating a medical advance directive? - No - Patient declined Yes (ED - Information included in AVS) No - Patient declined No - Patient  declined No - Patient declined No - Patient declined  Pre-existing out of facility DNR order (yellow form or pink MOST form) - - - - - - -  Some encounter information is confidential and restricted. Go to Review Flowsheets activity to see all data.    Hospital Utilization Over the Past 12 Months: # of hospitalizations or ER visits: 0 # of surgeries: 0  Review of Systems    Patient reports that her overall health is worse compared to last year.  Patient Reported Readings (BP, Pulse, CBG, Weight, etc) BP 125/83   Pulse 94   Ht 5\' 9"  (1.753 m)   Wt (!) 390 lb (176.9 kg)   LMP 02/18/1995   BMI 57.59 kg/m    Review of Systems: Negative except n/a  All other systems negative.  Pain Assessment       Current Medications & Allergies (verified) Allergies as of 09/30/2018      Reactions   Abilify [aripiprazole] Other (See Comments)   hallucinations   Naproxen Nausea And Vomiting, Swelling   Ativan [lorazepam] Other (See Comments)   Delirium   Haldol [haloperidol] Other (See Comments)   Hallucinating    Hydroxyzine Other (See Comments)   hallucinations      Medication List       Accurate as of Sep 30, 2018  3:06 PM. Always use your most recent med list.        albuterol (2.5 MG/3ML) 0.083% nebulizer solution Commonly known as:  PROVENTIL Take 3 mLs (2.5 mg total) by nebulization every 4 (four) hours as needed for wheezing or shortness of breath.  albuterol 108 (90 Base) MCG/ACT inhaler Commonly known as:  ProAir HFA USE 2 PUFF EVERY 4 HOURS AS NEEDED FOR WHEEZING OR SHORTNESS OF BREATH   atorvastatin 20 MG tablet Commonly known as:  LIPITOR TAKE 1 TABLET DAILY   benztropine 1 MG tablet Commonly known as:  COGENTIN Take 1 tablet (1 mg total) by mouth at bedtime.   Breo Ellipta 100-25 MCG/INH Aepb Generic drug:  fluticasone furoate-vilanterol INHALE 1 PUFF INTO THE LUNGS DAILY   celecoxib 200 MG capsule Commonly known as:  CELEBREX TAKE (1) CAPSULE TWICE  DAILY.   cetirizine 10 MG tablet Commonly known as:  ZYRTEC TAKE 1 TABLET ONCE DAILY   cloNIDine 0.1 MG tablet Commonly known as:  CATAPRES TAKE ONE TABLET AT BEDTIME   diclofenac sodium 1 % Gel Commonly known as:  Voltaren Apply to affected area twice daily as needed   divalproex 500 MG DR tablet Commonly known as:  DEPAKOTE Take 3 tablets (1,500 mg total) by mouth at bedtime.   DULoxetine 60 MG capsule Commonly known as:  CYMBALTA Take 1 capsule (60 mg total) by mouth daily.   fluticasone 50 MCG/ACT nasal spray Commonly known as:  FLONASE Place 1 spray 2 (two) times daily as needed into both nostrils for allergies or rhinitis.   levothyroxine 125 MCG tablet Commonly known as:  SYNTHROID TAKE (1) TABLET DAILY BE- FORE BREAKFAST.   nystatin cream Commonly known as:  MYCOSTATIN APPLY TO THE AFFECTED AREA THREE TIMES DAILY AS NEEDED .Marland KitchenFOR TOPICAL USE ONLY...   omeprazole 20 MG capsule Commonly known as:  PRILOSEC TAKE (1) CAPSULE DAILY   ondansetron 4 MG disintegrating tablet Commonly known as:  ZOFRAN-ODT DISSOLVE 1 TABLET IN MOUTH EVERY 8 HOURS AS NEEDED FOR NAUSEA AND VOMITING   polyethylene glycol powder 17 GM/SCOOP powder Commonly known as:  GLYCOLAX/MIRALAX Take 17 g by mouth 2 (two) times daily as needed.   promethazine 25 MG tablet Commonly known as:  PHENERGAN Take 1 tablet (25 mg total) by mouth every 8 (eight) hours as needed for nausea or vomiting.   risperidone 4 MG tablet Commonly known as:  RISPERDAL Take 1 tablet (4 mg total) by mouth at bedtime.   risperiDONE 2 MG tablet Commonly known as:  RisperDAL Take 1 tablet (2 mg total) by mouth daily as needed. For auditory hallucintations   therapeutic multivitamin-minerals tablet Take 1 tablet by mouth daily.   traMADol 50 MG tablet Commonly known as:  ULTRAM Take 1 tablet (50 mg total) by mouth every 8 (eight) hours as needed for moderate pain.       History (reviewed): Past Medical  History:  Diagnosis Date  . Allergy   . Anxiety   . Arthritis   . Asthma   . Bipolar 1 disorder ([place])   . CAD (coronary artery disease)   . Cellulitis   . CHF (congestive heart failure) ([facility])    diastolic dysfunction  . Chronic back pain   . Chronic headaches   . Complication of anesthesia    States she typically gets sick s/p anesthesia  . Contusion of sacrum   . COPD (chronic obstructive pulmonary disease) ([place])   . Dementia ([place])   . Depression   . Dyspnea    PFT 03/05/09 FEV1 2.77(98%), FVC 3.25(86%), FEV1% 85, TLC 5.88(99%), DLCO 60% ,  Methacholine challenge 03/16/09 normal ,  CT chest 03/12/09 no pulmonary disease  . Fungal infection   . GERD (gastroesophageal reflux disease)   . History of colonoscopy  10/17/2002   by Dr Rehman-> distal non-specific proctitis, small ext hemorrhoids,   . HTN (hypertension)   . Hyperlipidemia   . Hyperthyroidism    radioactive - thyroid cancer   . Migraine headache   . Morbid obesity with body mass index of 50.0-59.9 in adult [facility]) JAN 2011 370 LBS   2004 311 BMI 45.9  . Myocardial infarction ([place])    NOV 1997  . OSA on CPAP    she had been on 2L O2 at night but that was stopped  . PONV (postoperative nausea and vomiting)   . PTSD (post-traumatic stress disorder)   . PTSD (post-traumatic stress disorder)   . Suicidal ideation   . Urine incontinence   . Vitamin D deficiency    Past Surgical History:  Procedure Laterality Date  . ABDOMINAL HYSTERECTOMY     sept 1996  . APPENDECTOMY    . BACK SURGERY  2008  . CARDIAC CATHETERIZATION     nov 1997  . CHOLECYSTECTOMY    . COLONOSCOPY  10/17/2002    Distal proctitis, small external hemorrhoids, otherwise/  normal colonoscopy. Suspect rectal bleeding secondary to hemorrhoids  . ESOPHAGOGASTRODUODENOSCOPY  03/18/09   fundic gland polyps/mild gastritis  . HERNIA REPAIR  1978  . JOINT REPLACEMENT     bil knee replacement  . KNEE ARTHROSCOPY    . LEFT HEART CATH AND CORONARY  ANGIOGRAPHY N/A 08/08/2018   Procedure: LEFT HEART CATH AND CORONARY ANGIOGRAPHY;  Surgeon: Jettie Booze, MD;  Location: [facility];  Service: Cardiovascular;  Laterality: N/A;  . MULTIPLE EXTRACTIONS WITH ALVEOLOPLASTY N/A 08/16/2015   Procedure: EXTRACTION OF TEETH THREE, SIX, EIGHT, NINE, ELEVEN, FOURTEEN, FIFTEEN, TWENTY-EIGHT WITH ALVEOLOPLASTY;  Surgeon: Diona Browner, DDS;  Location: [facility];  Service: Oral Surgery;  Laterality: N/A;  . ROTATOR CUFF REPAIR Right   . TONSILLECTOMY    . TOTAL VAGINAL HYSTERECTOMY    . TUBAL LIGATION     Family History  Adopted: Yes  Problem Relation Age of Onset  . Hypertension Mother   . Bipolar disorder Mother   . Dementia Mother   . Depression Mother   . AAA (abdominal aortic aneurysm) Mother   . Coronary artery disease Father   . Alcohol abuse Father   . Hypertension Brother   . Coronary artery disease Brother   . Bipolar disorder Brother   . Depression Brother   . Depression Sister   . Paranoid behavior Sister   . Cancer Sister        breast  . Bipolar disorder Sister   . Depression Sister   . Hypertension Sister   . Arthritis Sister        knee and hand   . Irregular heart beat Sister        takes eliquis   . Cancer Son        thyroid  . Drug abuse Son   . Alcohol abuse Son   . Cancer Maternal Grandfather        throat   . Cancer Maternal Aunt        breast metastatized to brain  . Anesthesia problems Neg Hx   . Hypotension Neg Hx   . Malignant hyperthermia Neg Hx   . Pseudochol deficiency Neg Hx    Social History   Socioeconomic History  . Marital status: Legally Separated    Spouse name: Not on file  . Number of children: 2  . Years of education: Not on file  .  Highest education level: Not on file  Occupational History  . Occupation: Disabled    Fish farm manager: UNEMPLOYED    Comment: back problems  Social Needs  . Financial resource strain: Not on file  . Food insecurity:    Worry: Not on file     Inability: Not on file  . Transportation needs:    Medical: Not on file    Non-medical: Not on file  Tobacco Use  . Smoking status: Never Smoker  . Smokeless tobacco: Never Used  Substance and Sexual Activity  . Alcohol use: No    Alcohol/week: 0.0 standard drinks  . Drug use: No  . Sexual activity: Never    Birth control/protection: Abstinence  Lifestyle  . Physical activity:    Days per week: Not on file    Minutes per session: Not on file  . Stress: Not on file  Relationships  . Social connections:    Talks on phone: Not on file    Gets together: Not on file    Attends religious service: Not on file    Active member of club or organization: Not on file    Attends meetings of clubs or organizations: Not on file    Relationship status: Not on file  Other Topics Concern  . Not on file  Social History Narrative   Allison Sharp  is disabled and lives with her niece and her family. She has another sister with whom she has lived with in the past, and who is a Designer, jewellery, but not currently practicing.   She has two grown sons that she does not see often, as they live in other states. She has 5 grand children.   She has a long history of mental illness including depression, PTSD, suicidal and homicidal ideation.   She has been obese most all of her life. Her weight has significantly impacted her QOL. She recently lost 20 lbs. By decreasing portion size & increasing proteins.     Activities of Daily Living In your present state of health, do you have any difficulty performing the following activities: 09/30/2018 08/06/2018  Hearing? N -  [place]? Y -  Comment wears RX glasses  -  Difficulty concentrating or making decisions? Y -  Comment dementia  -  Walking or climbing stairs? Y -  Comment trouble with knees  -  Dressing or bathing? Y -  Doing errands, shopping? N Y  Conservation officer, nature and eating ? Y -  Using the Toilet? N -  In the past six months, have you  accidently leaked urine? N -  Do you have problems with loss of bowel control? N -  Managing your Medications? Y -  Managing your Finances? Y -  Housekeeping or managing your Housekeeping? Y -  Some encounter information is confidential and restricted. Go to Review Flowsheets activity to see all data.  Some recent data might be hidden    Patient Literacy    Exercise Current Exercise Habits: The patient does not participate in regular exercise at present, Exercise limited by: orthopedic condition(s);psychological condition(s)  Diet Patient reports consuming 2 meals a day and 1 snack(s) a day Patient reports that her primary diet is: Regular Patient reports that she does have regular access to food.   Depression Screen PHQ 2/9 Scores 09/30/2018 05/03/2018 10/08/2017 07/20/2017 07/02/2017 05/04/2017 04/16/2017  PHQ - 2 Score 0 0 0 0 0 0 1  PHQ- 9 Score - - - - - - -  Fall Risk Fall Risk  09/30/2018 05/03/2018 10/31/2017 10/08/2017 08/13/2017  Falls in the past year? 1 0 Yes No No  Number falls in past yr: 1 - 2 or more - 2 or more  Injury with Fall? 0 - No - No  Risk Factor Category  - - - - -  Risk for fall due to : - - - - -  Follow up - - - - -     Objective:  Allison Sharp seemed alert and oriented and she participated appropriately during our telephone visit.  Blood Pressure Weight BMI  BP Readings from Last 3 Encounters:  08/08/18 (!) 138/94  06/28/18 125/83  05/03/18 122/74   Wt Readings from Last 3 Encounters:  08/06/18 (!) 378 lb 4.8 oz (171.6 kg)  06/28/18 (!) 390 lb 6.4 oz (177.1 kg)  05/03/18 (!) 384 lb 9.6 oz (174.5 kg)   BMI Readings from Last 1 Encounters:  08/06/18 55.87 kg/m    *Unable to obtain current vital signs, weight, and BMI due to telephone visit type  Hearing/Vision  . Allison Sharp did not seem to have difficulty with hearing/understanding during the telephone conversation . Reports that she has had a formal Sharp exam by an Sharp care professional within the  past year . Reports that she has not had a formal hearing evaluation within the past year *Unable to fully assess hearing and vision during telephone visit type  Cognitive Function: 6CIT Screen 09/30/2018  What Year? 0 points  What month? 0 points  What time? 0 points  Count back from 20 0 points  Months in reverse 0 points  Repeat phrase 2 points  Total Score 2    Normal Cognitive Function Screening: Yes (Normal:0-7, Significant for Dysfunction: >8)  Immunization & Health Maintenance Record Immunization History  Administered Date(s) Administered  . Influenza Split 04/09/2013  . Influenza,inj,Quad PF,6+ Mos 03/03/2014, 03/01/2015, 03/01/2015, 03/14/2017, 03/13/2018  . Influenza-Unspecified 03/01/2016, 03/15/2017, 03/29/2018  . Pneumococcal Conjugate-13 05/30/2011, 12/29/2016  . Tdap 09/30/2012  . Zoster 07/02/2015    Health Maintenance  Topic Date Due  . PAP SMEAR-Modifier  07/01/2016  . COLONOSCOPY  05/29/2017  . INFLUENZA VACCINE  12/28/2018  . MAMMOGRAM  05/24/2020  . TETANUS/TDAP  10/01/2022  . Hepatitis C Screening  Completed  . HIV Screening  Completed       Assessment  This is a routine wellness examination for Allison Sharp.  Health Maintenance: Due or Overdue Health Maintenance Due  Topic Date Due  . PAP SMEAR-Modifier  07/01/2016  . COLONOSCOPY  05/29/2017    Allison Sharp does not need a referral for Community Assistance: Care Management:   no Social Work:    no Prescription Assistance:  no Nutrition/Diabetes Education:  no   Plan:  Personalized Goals Goals Addressed            This Visit's Progress   . 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  Encouraged her to join this exercise group, and try to exercise for 30 minutes at least 3 times per week    . Have 3 meals a day   On track    Include mostly lean proteins, vegetables, whole grains and fruits in moderation     . Prevent falls       she would also like to remember things better.       Personalized Health Maintenance &  Screening Recommendations  Pneumococcal vaccine   Lung Cancer Screening Recommended: no (Low Dose CT Chest recommended if Age 40-80 years, 30 pack-year currently smoking OR have quit w/in past 15 years) Hepatitis C Screening recommended: no HIV Screening recommended: no  Advanced Directives: Written information was prepared per patient's request. (packet to be picked up for pt)  Referrals & Orders No orders of the defined types were placed in this encounter.   Follow-up Plan . Follow-up with Dettinger, Fransisca Kaufmann, MD as planned    I have personally reviewed and noted the following in the patient's chart:   . Medical and social history . Use of alcohol, tobacco or illicit drugs  . Current medications and supplements . Functional ability and status . Nutritional status . Physical activity . Advanced directives . List of other physicians . Hospitalizations, surgeries, and ER visits in previous 12 months . Vitals . Screenings to include cognitive, depression, and falls . Referrals and appointments  In addition, I have reviewed and discussed with Allison Sharp certain preventive protocols, quality metrics, and best practice recommendations. A written personalized care plan for preventive services as well as general preventive health recommendations is available and can be mailed to the patient at her request.      Zannie Cove, LPN  06/03/1094

## 2018-10-01 NOTE — Telephone Encounter (Signed)
Pt called and appt set for tele with Dr Dettinger tomorrow.

## 2018-10-02 ENCOUNTER — Ambulatory Visit (INDEPENDENT_AMBULATORY_CARE_PROVIDER_SITE_OTHER): Payer: Medicare Other | Admitting: Family Medicine

## 2018-10-02 ENCOUNTER — Other Ambulatory Visit: Payer: Self-pay

## 2018-10-02 ENCOUNTER — Encounter: Payer: Self-pay | Admitting: Family Medicine

## 2018-10-02 ENCOUNTER — Telehealth: Payer: Self-pay | Admitting: Family Medicine

## 2018-10-02 DIAGNOSIS — E039 Hypothyroidism, unspecified: Secondary | ICD-10-CM | POA: Diagnosis not present

## 2018-10-02 MED ORDER — LEVOTHYROXINE SODIUM 25 MCG PO TABS: 25.0000 ug | ORAL_TABLET | Freq: Every day | ORAL | 3 refills | Status: DC

## 2018-10-02 MED ORDER — LEVOTHYROXINE SODIUM 125 MCG PO TABS: ORAL_TABLET | ORAL | 11 refills | Status: DC

## 2018-10-02 NOTE — Telephone Encounter (Signed)
Yes she is supposed to take it with the 125, added to hers

## 2018-10-02 NOTE — Progress Notes (Signed)
Virtual Visit via telephone Note  I connected with Allison Sharp on 10/02/18 at 1139 by telephone and verified that I am speaking with the correct person using two identifiers. Allison Sharp is currently located at home and daughter are currently with her during visit. The provider, Fransisca Kaufmann Dois Juarbe, MD is located in their office at time of visit.  Call ended at 1148  I discussed the limitations, risks, security and privacy concerns of performing an evaluation and management service by telephone and the availability of in person appointments. I also discussed with the patient that there may be a patient responsible charge related to this service. The patient expressed understanding and agreed to proceed.   History and Present Illness: Hypothyroidism recheck Patient is coming in for thyroid recheck today as well. They deny any issues with hair changes or heat or cold problems or diarrhea or constipation. They deny any chest pain or palpitations. They are currently on levothyroxine 116micrograms   No diagnosis found.  Outpatient Encounter Medications as of 10/02/2018  Medication Sig   albuterol (PROAIR HFA) 108 (90 Base) MCG/ACT inhaler USE 2 PUFF EVERY 4 HOURS AS NEEDED FOR WHEEZING OR SHORTNESS OF BREATH   albuterol (PROVENTIL) (2.5 MG/3ML) 0.083% nebulizer solution Take 3 mLs (2.5 mg total) by nebulization every 4 (four) hours as needed for wheezing or shortness of breath.   atorvastatin (LIPITOR) 20 MG tablet TAKE 1 TABLET DAILY (Patient taking differently: Take 20 mg by mouth daily. )   benztropine (COGENTIN) 1 MG tablet Take 1 tablet (1 mg total) by mouth at bedtime.   BREO ELLIPTA 100-25 MCG/INH AEPB INHALE 1 PUFF INTO THE LUNGS DAILY   celecoxib (CELEBREX) 200 MG capsule TAKE (1) CAPSULE TWICE DAILY.   cetirizine (ZYRTEC) 10 MG tablet TAKE 1 TABLET ONCE DAILY   cloNIDine (CATAPRES) 0.1 MG tablet TAKE ONE TABLET AT BEDTIME   diclofenac sodium (VOLTAREN) 1 % GEL Apply to  affected area twice daily as needed (Patient taking differently: Apply 2 g topically 2 (two) times daily as needed (PAIN). )   divalproex (DEPAKOTE) 500 MG DR tablet Take 3 tablets (1,500 mg total) by mouth at bedtime.   DULoxetine (CYMBALTA) 60 MG capsule Take 1 capsule (60 mg total) by mouth daily.   fluticasone (FLONASE) 50 MCG/ACT nasal spray Place 1 spray 2 (two) times daily as needed into both nostrils for allergies or rhinitis.   levothyroxine (SYNTHROID, LEVOTHROID) 125 MCG tablet TAKE (1) TABLET DAILY BE- FORE BREAKFAST. (Patient taking differently: Take 125 mcg by mouth daily before breakfast. )   nystatin cream (MYCOSTATIN) APPLY TO THE AFFECTED AREA THREE TIMES DAILY AS NEEDED .Marland KitchenFOR TOPICAL USE ONLY... (Patient taking differently: Apply 1 application topically 3 (three) times daily as needed for dry skin. )   omeprazole (PRILOSEC) 20 MG capsule TAKE (1) CAPSULE DAILY   ondansetron (ZOFRAN-ODT) 4 MG disintegrating tablet DISSOLVE 1 TABLET IN MOUTH EVERY 8 HOURS AS NEEDED FOR NAUSEA AND VOMITING (Patient not taking: No sig reported)   polyethylene glycol powder (GLYCOLAX/MIRALAX) powder Take 17 g by mouth 2 (two) times daily as needed. (Patient taking differently: Take 17 g by mouth 2 (two) times daily as needed for moderate constipation. )   promethazine (PHENERGAN) 25 MG tablet Take 1 tablet (25 mg total) by mouth every 8 (eight) hours as needed for nausea or vomiting. (Patient not taking: Reported on 09/30/2018)   risperiDONE (RISPERDAL) 2 MG tablet Take 1 tablet (2 mg total) by mouth daily as needed.  For auditory hallucintations   risperidone (RISPERDAL) 4 MG tablet Take 1 tablet (4 mg total) by mouth at bedtime.   therapeutic multivitamin-minerals (THERAGRAN-M) tablet Take 1 tablet by mouth daily.   traMADol (ULTRAM) 50 MG tablet Take 1 tablet (50 mg total) by mouth every 8 (eight) hours as needed for moderate pain.   No facility-administered encounter medications on file as  of 10/02/2018.     Review of Systems  Constitutional: Positive for fatigue. Negative for chills and fever.  Eyes: Negative for visual disturbance.  Respiratory: Negative for chest tightness and shortness of breath.   Cardiovascular: Negative for chest pain and leg swelling.  Musculoskeletal: Negative for back pain and gait problem.  Skin: Negative for rash.  Neurological: Negative for light-headedness and headaches.  Psychiatric/Behavioral: Negative for agitation, behavioral problems, dysphoric mood, self-injury, sleep disturbance and suicidal ideas. The patient is not nervous/anxious.   All other systems reviewed and are negative.   Observations/Objective: Patient sounds comfortable and in no acute distress  Assessment and Plan: Problem List Items Addressed This Visit      Endocrine   Hypothyroidism - Primary (Chronic)   Relevant Medications   levothyroxine (SYNTHROID) 25 MCG tablet   levothyroxine (SYNTHROID) 125 MCG tablet       Follow Up Instructions:  Follow-up 2 months for thyroid recheck   I discussed the assessment and treatment plan with the patient. The patient was provided an opportunity to ask questions and all were answered. The patient agreed with the plan and demonstrated an understanding of the instructions.   The patient was advised to call back or seek an in-person evaluation if the symptoms worsen or if the condition fails to improve as anticipated.  The above assessment and management plan was discussed with the patient. The patient verbalized understanding of and has agreed to the management plan. Patient is aware to call the clinic if symptoms persist or worsen. Patient is aware when to return to the clinic for a follow-up visit. Patient educated on when it is appropriate to go to the emergency department.    I provided 9 minutes of non-face-to-face time during this encounter.    Worthy Rancher, MD

## 2018-10-02 NOTE — Telephone Encounter (Signed)
Rhonda at pharmacy aware

## 2018-10-02 NOTE — Telephone Encounter (Signed)
Was the levothyroxine 5mcg to be taken with the 125 mcg. I didn't see notes

## 2018-10-14 ENCOUNTER — Encounter (HOSPITAL_COMMUNITY): Payer: Self-pay

## 2018-10-14 ENCOUNTER — Telehealth (HOSPITAL_COMMUNITY): Payer: Self-pay

## 2018-10-14 ENCOUNTER — Other Ambulatory Visit: Payer: Self-pay

## 2018-10-14 ENCOUNTER — Ambulatory Visit (HOSPITAL_COMMUNITY)
Admission: AD | Admit: 2018-10-14 | Discharge: 2018-10-14 | Disposition: A | Discharge status: Home / Self Care | Payer: Medicare Other | Source: Home / Self Care | Attending: Psychiatry | Admitting: Psychiatry

## 2018-10-14 ENCOUNTER — Emergency Department (HOSPITAL_COMMUNITY)
Admission: EM | Admit: 2018-10-14 | Discharge: 2018-10-15 | Disposition: A | Discharge status: Other Acute Inpatient Hospital | Payer: Medicare Other | Attending: Emergency Medicine | Admitting: Emergency Medicine

## 2018-10-14 DIAGNOSIS — Z79899 Other long term (current) drug therapy: Secondary | ICD-10-CM | POA: Insufficient documentation

## 2018-10-14 DIAGNOSIS — F333 Major depressive disorder, recurrent, severe with psychotic symptoms: Secondary | ICD-10-CM | POA: Insufficient documentation

## 2018-10-14 DIAGNOSIS — I10 Essential (primary) hypertension: Secondary | ICD-10-CM | POA: Diagnosis not present

## 2018-10-14 DIAGNOSIS — J449 Chronic obstructive pulmonary disease, unspecified: Secondary | ICD-10-CM | POA: Diagnosis not present

## 2018-10-14 DIAGNOSIS — Z800 Family history of malignant neoplasm of digestive organs: Secondary | ICD-10-CM | POA: Insufficient documentation

## 2018-10-14 DIAGNOSIS — I5032 Chronic diastolic (congestive) heart failure: Secondary | ICD-10-CM | POA: Diagnosis not present

## 2018-10-14 DIAGNOSIS — I110 Hypertensive heart disease with heart failure: Secondary | ICD-10-CM | POA: Insufficient documentation

## 2018-10-14 DIAGNOSIS — I2510 Atherosclerotic heart disease of native coronary artery without angina pectoris: Secondary | ICD-10-CM | POA: Insufficient documentation

## 2018-10-14 DIAGNOSIS — I252 Old myocardial infarction: Secondary | ICD-10-CM | POA: Insufficient documentation

## 2018-10-14 DIAGNOSIS — F319 Bipolar disorder, unspecified: Secondary | ICD-10-CM | POA: Diagnosis not present

## 2018-10-14 DIAGNOSIS — I509 Heart failure, unspecified: Secondary | ICD-10-CM | POA: Insufficient documentation

## 2018-10-14 DIAGNOSIS — Z6841 Body Mass Index (BMI) 40.0 and over, adult: Secondary | ICD-10-CM | POA: Insufficient documentation

## 2018-10-14 DIAGNOSIS — Z886 Allergy status to analgesic agent status: Secondary | ICD-10-CM | POA: Insufficient documentation

## 2018-10-14 DIAGNOSIS — R440 Auditory hallucinations: Secondary | ICD-10-CM | POA: Diagnosis present

## 2018-10-14 DIAGNOSIS — R0602 Shortness of breath: Secondary | ICD-10-CM | POA: Diagnosis not present

## 2018-10-14 DIAGNOSIS — Z03818 Encounter for observation for suspected exposure to other biological agents ruled out: Secondary | ICD-10-CM | POA: Diagnosis not present

## 2018-10-14 DIAGNOSIS — Z1159 Encounter for screening for other viral diseases: Secondary | ICD-10-CM | POA: Insufficient documentation

## 2018-10-14 DIAGNOSIS — Z8249 Family history of ischemic heart disease and other diseases of the circulatory system: Secondary | ICD-10-CM | POA: Insufficient documentation

## 2018-10-14 DIAGNOSIS — Z85850 Personal history of malignant neoplasm of thyroid: Secondary | ICD-10-CM | POA: Diagnosis not present

## 2018-10-14 DIAGNOSIS — Z888 Allergy status to other drugs, medicaments and biological substances status: Secondary | ICD-10-CM | POA: Insufficient documentation

## 2018-10-14 DIAGNOSIS — Z818 Family history of other mental and behavioral disorders: Secondary | ICD-10-CM | POA: Insufficient documentation

## 2018-10-14 DIAGNOSIS — Z803 Family history of malignant neoplasm of breast: Secondary | ICD-10-CM | POA: Insufficient documentation

## 2018-10-14 DIAGNOSIS — J45909 Unspecified asthma, uncomplicated: Secondary | ICD-10-CM | POA: Insufficient documentation

## 2018-10-14 DIAGNOSIS — Z9049 Acquired absence of other specified parts of digestive tract: Secondary | ICD-10-CM | POA: Insufficient documentation

## 2018-10-14 DIAGNOSIS — E669 Obesity, unspecified: Secondary | ICD-10-CM | POA: Diagnosis not present

## 2018-10-14 DIAGNOSIS — G4733 Obstructive sleep apnea (adult) (pediatric): Secondary | ICD-10-CM | POA: Insufficient documentation

## 2018-10-14 DIAGNOSIS — F0390 Unspecified dementia without behavioral disturbance: Secondary | ICD-10-CM | POA: Insufficient documentation

## 2018-10-14 DIAGNOSIS — R45851 Suicidal ideations: Secondary | ICD-10-CM | POA: Insufficient documentation

## 2018-10-14 LAB — RAPID URINE DRUG SCREEN, HOSP PERFORMED
Amphetamines: NOT DETECTED
Barbiturates: NOT DETECTED
Benzodiazepines: NOT DETECTED
Cocaine: NOT DETECTED
Opiates: NOT DETECTED
Tetrahydrocannabinol: NOT DETECTED

## 2018-10-14 LAB — COMPREHENSIVE METABOLIC PANEL
ALT: 15 U/L (ref 0–44)
AST: 15 U/L (ref 15–41)
Albumin: 3.5 g/dL (ref 3.5–5.0)
Alkaline Phosphatase: 69 U/L (ref 38–126)
Anion gap: 11 (ref 5–15)
BUN: 13 mg/dL (ref 8–23)
CO2: 26 mmol/L (ref 22–32)
Calcium: 9.3 mg/dL (ref 8.9–10.3)
Chloride: 103 mmol/L (ref 98–111)
Creatinine, Ser: 0.91 mg/dL (ref 0.44–1.00)
GFR calc Af Amer: >60 mL/min (ref >60)
GFR calc non Af Amer: >60 mL/min (ref >60)
Glucose, Bld: 141 mg/dL — ABNORMAL HIGH (ref 70–99)
Potassium: 3.9 mmol/L (ref 3.5–5.1)
Sodium: 140 mmol/L (ref 135–145)
Total Bilirubin: 0.7 mg/dL (ref 0.3–1.2)
Total Protein: 6.4 g/dL — ABNORMAL LOW (ref 6.5–8.1)

## 2018-10-14 LAB — ACETAMINOPHEN LEVEL: Acetaminophen (Tylenol), Serum: <10 ug/mL — ABNORMAL LOW (ref 10–30)

## 2018-10-14 LAB — CBC
HCT: 38.8 % (ref 36.0–46.0)
Hemoglobin: 12.7 g/dL (ref 12.0–15.0)
MCH: 30.9 pg (ref 26.0–34.0)
MCHC: 32.7 g/dL (ref 30.0–36.0)
MCV: 94.4 fL (ref 80.0–100.0)
Platelets: 104 10*3/uL — ABNORMAL LOW (ref 150–400)
RBC: 4.11 MIL/uL (ref 3.87–5.11)
RDW: 13 % (ref 11.5–15.5)
WBC: 7.2 10*3/uL (ref 4.0–10.5)
nRBC: 0 % (ref 0.0–0.2)

## 2018-10-14 LAB — SALICYLATE LEVEL: Salicylate Lvl: <7 mg/dL (ref 2.8–30.0)

## 2018-10-14 LAB — ETHANOL: Alcohol, Ethyl (B): <10 mg/dL (ref <10)

## 2018-10-14 MED ORDER — DULOXETINE HCL 60 MG PO CPEP
60.0000 mg | ORAL_CAPSULE | Freq: Every day | ORAL | Status: DC
  Administered 2018-10-15: 60 mg via ORAL
  Filled 2018-10-14: qty 1

## 2018-10-14 MED ORDER — FLUTICASONE FUROATE-VILANTEROL 100-25 MCG/INH IN AEPB
1.0000 | INHALATION_SPRAY | Freq: Every day | RESPIRATORY_TRACT | Status: DC
  Administered 2018-10-15: 1 via RESPIRATORY_TRACT
  Filled 2018-10-14: qty 28

## 2018-10-14 MED ORDER — LORATADINE 10 MG PO TABS
10.0000 mg | ORAL_TABLET | Freq: Every day | ORAL | Status: DC
  Administered 2018-10-15: 10 mg via ORAL
  Filled 2018-10-14: qty 1

## 2018-10-14 MED ORDER — LEVOTHYROXINE SODIUM 25 MCG PO TABS
25.0000 ug | ORAL_TABLET | Freq: Every day | ORAL | Status: DC
  Administered 2018-10-15: 25 ug via ORAL
  Filled 2018-10-14: qty 1

## 2018-10-14 MED ORDER — PANTOPRAZOLE SODIUM 40 MG PO TBEC
40.0000 mg | DELAYED_RELEASE_TABLET | Freq: Every day | ORAL | Status: DC
  Administered 2018-10-15: 09:00:00 40 mg via ORAL
  Filled 2018-10-14: qty 1

## 2018-10-14 MED ORDER — ATORVASTATIN CALCIUM 10 MG PO TABS
20.0000 mg | ORAL_TABLET | Freq: Every day | ORAL | Status: DC
  Administered 2018-10-15: 20 mg via ORAL
  Filled 2018-10-14: qty 2

## 2018-10-14 MED ORDER — ALBUTEROL SULFATE HFA 108 (90 BASE) MCG/ACT IN AERS: 2.0000 | INHALATION_SPRAY | RESPIRATORY_TRACT | Status: DC | PRN

## 2018-10-14 MED ORDER — DIVALPROEX SODIUM 250 MG PO DR TAB
1500.0000 mg | DELAYED_RELEASE_TABLET | Freq: Every day | ORAL | Status: DC
  Administered 2018-10-14 – 2018-10-15 (×2): 1500 mg via ORAL
  Filled 2018-10-14 (×2): qty 6

## 2018-10-14 MED ORDER — ALBUTEROL SULFATE (2.5 MG/3ML) 0.083% IN NEBU: 2.5000 mg | INHALATION_SOLUTION | RESPIRATORY_TRACT | Status: DC | PRN

## 2018-10-14 MED ORDER — BENZTROPINE MESYLATE 1 MG PO TABS
1.0000 mg | ORAL_TABLET | Freq: Every day | ORAL | Status: DC
  Administered 2018-10-14 – 2018-10-15 (×2): 1 mg via ORAL
  Filled 2018-10-14 (×2): qty 1

## 2018-10-14 MED ORDER — DICLOFENAC SODIUM 1 % TD GEL
2.0000 g | Freq: Two times a day (BID) | TRANSDERMAL | Status: DC | PRN
  Filled 2018-10-14: qty 100

## 2018-10-14 MED ORDER — FLUTICASONE PROPIONATE 50 MCG/ACT NA SUSP
1.0000 | Freq: Two times a day (BID) | NASAL | Status: DC | PRN
  Administered 2018-10-15: 1 via NASAL
  Filled 2018-10-14: qty 16

## 2018-10-14 NOTE — ED Provider Notes (Cosign Needed)
[place] EMERGENCY DEPARTMENT Provider Note   CSN: [id number] Arrival date & time: 10/14/18  1923    History   Chief Complaint Chief Complaint  Patient presents with  . Hallucinations    HPI Allison Sharp is a 63 y.o. female.     Patient to ED concerning auditory hallucinations where she hears voices. She was seen at [facility] and sent here for medical clearance. She states she is starting to have conversations with the voices and her niece became concerned. No SI/HI. She has a complicated medical history, including COPD on continuous oxygen at 2L. No physical complaints.   The history is provided by the patient. No language interpreter was used.    Past Medical History:  Diagnosis Date  . Allergy   . Anxiety   . Arthritis   . Asthma   . Bipolar 1 disorder ([place])   . CAD (coronary artery disease)   . Cellulitis   . CHF (congestive heart failure) (HCC)    diastolic dysfunction  . Chronic back pain   . Chronic headaches   . Complication of anesthesia    States she typically gets sick s/p anesthesia  . Contusion of sacrum   . COPD (chronic obstructive pulmonary disease) ([place])   . Dementia ([place])   . Depression   . Dyspnea    PFT 03/05/09 FEV1 2.77(98%), FVC 3.25(86%), FEV1% 85, TLC 5.88(99%), DLCO 60% ,  Methacholine challenge 03/16/09 normal ,  CT chest 03/12/09 no pulmonary disease  . Fungal infection   . GERD (gastroesophageal reflux disease)   . History of colonoscopy 10/17/2002   by Dr Rehman-> distal non-specific proctitis, small ext hemorrhoids,   . HTN (hypertension)   . Hyperlipidemia   . Hyperthyroidism    radioactive - thyroid cancer   . Migraine headache   . Morbid obesity with body mass index of 50.0-59.9 in adult [facility]) JAN 2011 370 LBS   2004 311 BMI 45.9  . Myocardial infarction ([place])    NOV 1997  . OSA on CPAP    she had been on 2L O2 at night but that was stopped  . PONV (postoperative nausea and vomiting)   .  PTSD (post-traumatic stress disorder)   . PTSD (post-traumatic stress disorder)   . Suicidal ideation   . Urine incontinence   . Vitamin D deficiency     Patient Active Problem List   Diagnosis Date Noted  . Memory loss 09/16/2018  . Chest pain 08/06/2018  . Prolonged QT interval 08/06/2018  . Chronic obstructive pulmonary disease ([place]) 05/06/2018  . Orthostatic hypotension 10/24/2017  . Fatty liver 10/24/2017  . Thrombocytopenia ([place]) 09/29/2017  . Visual hallucinations   . Auditory hallucination   . Acute encephalopathy 09/26/2017  . Lower urinary tract infectious disease 09/26/2017  . Bipolar I disorder, most recent episode depressed, severe w psychosis ([place]) 06/21/2016  . History of MI (myocardial infarction) 06/21/2016  . Spinal stenosis of lumbar region with neurogenic claudication 03/29/2016  . Severe obesity (BMI >= 40) ([place]) 07/05/2015  . Hyperlipidemia LDL goal <130 07/05/2015  . MDD (major depressive disorder), single episode, severe with psychotic features ([place]) 01/21/2015  . 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 Bilateral knee pain 01/07/2014  . Scar condition and fibrosis of skin 12/11/2013  . Essential hypertension, benign 07/01/2013  . Asthma with acute exacerbation 11/04/2012  . OSA on CPAP 11/04/2012  . Back pain, chronic 08/27/2012  . Vitamin D insufficiency 04/04/2012  . Insomnia due to mental  disorder 04/04/2012  . Anxiety 04/04/2012  . OCD (obsessive compulsive disorder) 04/04/2012  . PTSD (post-traumatic stress disorder) 06/28/2011  . Coronary artery disease 08/26/2010  . FATTY LIVER DISEASE 03/29/2009  . GERD 03/01/2009  . Morbid obesity ([place]) 02/15/2009  . Hypothyroidism 02/12/2009  . HYPERCHOLESTEROLEMIA 02/12/2009    Past Surgical History:  Procedure Laterality Date  . ABDOMINAL HYSTERECTOMY     sept 1996  . APPENDECTOMY    . BACK SURGERY  2008  . CARDIAC CATHETERIZATION     nov 1997  . CHOLECYSTECTOMY    . COLONOSCOPY  10/17/2002     Distal proctitis, small external hemorrhoids, otherwise/  normal colonoscopy. Suspect rectal bleeding secondary to hemorrhoids  . ESOPHAGOGASTRODUODENOSCOPY  03/18/09   fundic gland polyps/mild gastritis  . HERNIA REPAIR  1978  . JOINT REPLACEMENT     bil knee replacement  . KNEE ARTHROSCOPY    . LEFT HEART CATH AND CORONARY ANGIOGRAPHY N/A 08/08/2018   Procedure: LEFT HEART CATH AND CORONARY ANGIOGRAPHY;  Surgeon: Jettie Booze, MD;  Location: [facility];  Service: Cardiovascular;  Laterality: N/A;  . MULTIPLE EXTRACTIONS WITH ALVEOLOPLASTY N/A 08/16/2015   Procedure: EXTRACTION OF TEETH THREE, SIX, EIGHT, NINE, ELEVEN, FOURTEEN, FIFTEEN, TWENTY-EIGHT WITH ALVEOLOPLASTY;  Surgeon: Diona Browner, DDS;  Location: [place];  Service: Oral Surgery;  Laterality: N/A;  . ROTATOR CUFF REPAIR Right   . TONSILLECTOMY    . TOTAL VAGINAL HYSTERECTOMY    . TUBAL LIGATION       OB History   No obstetric history on file.      Home Medications    Prior to Admission medications   Medication Sig Start Date End Date Taking? Authorizing Provider  albuterol (PROAIR HFA) 108 (90 Base) MCG/ACT inhaler USE 2 PUFF EVERY 4 HOURS AS NEEDED FOR WHEEZING OR SHORTNESS OF BREATH 05/03/18   Terald Sleeper, PA-C  albuterol (PROVENTIL) (2.5 MG/3ML) 0.083% nebulizer solution Take 3 mLs (2.5 mg total) by nebulization every 4 (four) hours as needed for wheezing or shortness of breath. 05/03/18   Terald Sleeper, PA-C  atorvastatin (LIPITOR) 20 MG tablet TAKE 1 TABLET DAILY Patient taking differently: Take 20 mg by mouth daily.  07/26/18   Dettinger, Fransisca Kaufmann, MD  benztropine (COGENTIN) 1 MG tablet Take 1 tablet (1 mg total) by mouth at bedtime. 08/26/18 08/26/19  Arfeen, Arlyce Harman, MD  BREO ELLIPTA 100-25 MCG/INH AEPB INHALE 1 PUFF INTO THE LUNGS DAILY 08/08/18   Dettinger, Fransisca Kaufmann, MD  celecoxib (CELEBREX) 200 MG capsule TAKE (1) CAPSULE TWICE DAILY. 09/25/18   Dettinger, Fransisca Kaufmann, MD  cetirizine (ZYRTEC) 10 MG  tablet TAKE 1 TABLET ONCE DAILY 09/25/18   Dettinger, Fransisca Kaufmann, MD  cloNIDine (CATAPRES) 0.1 MG tablet TAKE ONE TABLET AT BEDTIME 05/07/18   Dettinger, Fransisca Kaufmann, MD  diclofenac sodium (VOLTAREN) 1 % GEL Apply to affected area twice daily as needed Patient taking differently: Apply 2 g topically 2 (two) times daily as needed (PAIN).  03/15/16   Dettinger, Fransisca Kaufmann, MD  divalproex (DEPAKOTE) 500 MG DR tablet Take 3 tablets (1,500 mg total) by mouth at bedtime. 08/27/18   Arfeen, Arlyce Harman, MD  DULoxetine (CYMBALTA) 60 MG capsule Take 1 capsule (60 mg total) by mouth daily. 08/27/18   Arfeen, Arlyce Harman, MD  fluticasone (FLONASE) 50 MCG/ACT nasal spray Place 1 spray 2 (two) times daily as needed into both nostrils for allergies or rhinitis. 04/16/17   Terald Sleeper, PA-C  levothyroxine (SYNTHROID)  125 MCG tablet TAKE (1) TABLET DAILY BE- FORE BREAKFAST. 10/02/18   Dettinger, Fransisca Kaufmann, MD  levothyroxine (SYNTHROID) 25 MCG tablet Take 1 tablet (25 mcg total) by mouth daily before breakfast. 10/02/18   Dettinger, Fransisca Kaufmann, MD  nystatin cream (MYCOSTATIN) APPLY TO THE AFFECTED AREA THREE TIMES DAILY AS NEEDED .Marland KitchenFOR TOPICAL USE ONLY... Patient taking differently: Apply 1 application topically 3 (three) times daily as needed for dry skin.  02/20/17   Dettinger, Fransisca Kaufmann, MD  omeprazole (PRILOSEC) 20 MG capsule TAKE (1) CAPSULE DAILY 09/25/18   Dettinger, Fransisca Kaufmann, MD  ondansetron (ZOFRAN-ODT) 4 MG disintegrating tablet DISSOLVE 1 TABLET IN MOUTH EVERY 8 HOURS AS NEEDED FOR NAUSEA AND VOMITING Patient not taking: No sig reported 01/02/18   Dettinger, Fransisca Kaufmann, MD  polyethylene glycol powder (GLYCOLAX/MIRALAX) powder Take 17 g by mouth 2 (two) times daily as needed. Patient taking differently: Take 17 g by mouth 2 (two) times daily as needed for moderate constipation.  06/07/17   Dettinger, Fransisca Kaufmann, MD  promethazine (PHENERGAN) 25 MG tablet Take 1 tablet (25 mg total) by mouth every 8 (eight) hours as needed for nausea or  vomiting. Patient not taking: Reported on 09/30/2018 07/05/17   Dettinger, Fransisca Kaufmann, MD  risperiDONE (RISPERDAL) 2 MG tablet Take 1 tablet (2 mg total) by mouth daily as needed. For auditory hallucintations 08/28/18   Arfeen, Arlyce Harman, MD  risperidone (RISPERDAL) 4 MG tablet Take 1 tablet (4 mg total) by mouth at bedtime. 08/27/18 08/27/19  Arfeen, Arlyce Harman, MD  therapeutic multivitamin-minerals [facility]) tablet Take 1 tablet by mouth daily.    [provider]  traMADol (ULTRAM) 50 MG tablet Take 1 tablet (50 mg total) by mouth every 8 (eight) hours as needed for moderate pain. 06/28/18   Dettinger, Fransisca Kaufmann, MD    Family History Family History  Adopted: Yes  Problem Relation Age of Onset  . Hypertension Mother   . Bipolar disorder Mother   . Dementia Mother   . Depression Mother   . AAA (abdominal aortic aneurysm) Mother   . Coronary artery disease Father   . Alcohol abuse Father   . Hypertension Brother   . Coronary artery disease Brother   . Bipolar disorder Brother   . Depression Brother   . Depression Sister   . Paranoid behavior Sister   . Cancer Sister        breast  . Bipolar disorder Sister   . Depression Sister   . Hypertension Sister   . Arthritis Sister        knee and hand   . Irregular heart beat Sister        takes eliquis   . Cancer Son        thyroid  . Drug abuse Son   . Alcohol abuse Son   . Cancer Maternal Grandfather        throat   . Cancer Maternal Aunt        breast metastatized to brain  . Anesthesia problems Neg Hx   . Hypotension Neg Hx   . Malignant hyperthermia Neg Hx   . Pseudochol deficiency Neg Hx     Social History Social History   Tobacco Use  . Smoking status: Never Smoker  . Smokeless tobacco: Never Used  Substance Use Topics  . Alcohol use: No    Alcohol/week: 0.0 standard drinks  . Drug use: No     Allergies   Abilify [aripiprazole]; Naproxen; Ativan [lorazepam]; Haldol [  haloperidol]; and Hydroxyzine   Review of  Systems Review of Systems  Constitutional: Negative for chills and fever.  HENT: Negative.   Respiratory: Negative.  Negative for cough and shortness of breath.   Cardiovascular: Negative.  Negative for chest pain.  Gastrointestinal: Negative.  Negative for abdominal pain and vomiting.  Musculoskeletal: Negative.   Skin: Negative.   Neurological: Negative.   Psychiatric/Behavioral: Positive for hallucinations. Negative for suicidal ideas.     Physical Exam Updated Vital Signs BP 135/79   Pulse 95   Temp 98.2 F (36.8 C) (Oral)   Resp 16   Ht 5\' 9"  (1.753 m)   Wt (!) 181.4 kg   LMP 02/18/1995   SpO2 95%   BMI 59.07 kg/m   Physical Exam Constitutional:      Appearance: She is well-developed.  HENT:     Head: Normocephalic.  Neck:     Musculoskeletal: Normal range of motion and neck supple.  Cardiovascular:     Rate and Rhythm: Normal rate and regular rhythm.  Pulmonary:     Effort: Pulmonary effort is normal.     Breath sounds: Normal breath sounds.  Abdominal:     General: Bowel sounds are normal.     Palpations: Abdomen is soft.     Tenderness: There is no abdominal tenderness. There is no guarding or rebound.  Musculoskeletal: Normal range of motion.  Skin:    General: Skin is warm and dry.     Findings: No rash.  Neurological:     General: No focal deficit present.     Mental Status: She is alert and oriented to person, place, and time.  Psychiatric:        Attention and Perception: She perceives auditory hallucinations.        Behavior: Behavior is cooperative.        Thought Content: Thought content does not include homicidal or suicidal ideation.      ED Treatments / Results  Labs (all labs ordered are listed, but only abnormal results are displayed) Labs Reviewed  COMPREHENSIVE METABOLIC PANEL - Abnormal; Notable for the following components:      Result Value   Glucose, Bld 141 (*)    Total Protein 6.4 (*)    All other components within normal  limits  ACETAMINOPHEN LEVEL - Abnormal; Notable for the following components:   Acetaminophen (Tylenol), Serum <10 (*)    All other components within normal limits  CBC - Abnormal; Notable for the following components:   Platelets 104 (*)    All other components within normal limits  ETHANOL  SALICYLATE LEVEL  RAPID URINE DRUG SCREEN, HOSP PERFORMED  URINALYSIS, ROUTINE W REFLEX MICROSCOPIC    EKG None  Radiology No results found.  Procedures Procedures (including critical care time)  Medications Ordered in ED Medications - No data to display   Initial Impression / Assessment and Plan / ED Course  I have reviewed the triage vital signs and the nursing notes.  Pertinent labs & imaging results that were available during my care of the patient were reviewed by me and considered in my medical decision making (see chart for details).        Patient to ED with auditory hallucinations. They are not command voices. No SI/HI.   Labs reviewed. She appears stable medically. Will wait for UA and she has a history of UTI. [facility] has already performed their assessment and will seek placement.   UA shows 21-50 WBC's, rare bacterial. Will wait  for urine culture before abx treatment. Doubt infection.  She is consider medically cleared for further treatment.   Final Clinical Impressions(s) / ED Diagnoses   Final diagnoses:  None   1. Auditory hallucinations.  ED Discharge Orders    None       Charlann Lange, [place] 10/15/18 6701

## 2018-10-14 NOTE — H&P (Signed)
Behavioral Health Medical Screening Exam  Allison Sharp is an 63 y.o. female.  Patient presented as a walk-in with her caregiver.  Is reported that patient is having auditory hallucinations.  She does have a mental health history and sees Dr. Adele Schilder for her outpatient services.  Patient's caregiver reports that the patient was told by Dr. Adele Schilder to come to the hospital and that they wanted her to be admitted here.  Based on the description of the history with UTIs and similar symptoms feel that the patient should be medically cleared before she is considered for placement.  They were also informed that with the patient being on continuous O2 that the patient would not be admitted to the behavioral health Hospital and if she meets inpatient criteria after she is medically cleared then the patient will be faxed out.  Patient denies any suicidal or homicidal ideations and at this time meets inpatient criteria awaiting medical clearance.  Total Time spent with patient: 20 minutes  Psychiatric Specialty Exam: Physical Exam  Nursing note and vitals reviewed. Constitutional: She is oriented to person, place, and time. She appears well-developed and well-nourished.  Cardiovascular: Normal rate.  Respiratory: Effort normal.  Musculoskeletal: Normal range of motion.  Neurological: She is alert and oriented to person, place, and time.  Skin: Skin is warm.    Review of Systems  Constitutional: Negative.   HENT: Negative.   Eyes: Negative.   Respiratory: Negative.   Cardiovascular: Negative.   Gastrointestinal: Negative.   Genitourinary: Negative.   Musculoskeletal: Negative.   Skin: Negative.   Neurological: Negative.   Endo/Heme/Allergies: Negative.   Psychiatric/Behavioral: Positive for depression and hallucinations.    Blood pressure 135/84, pulse (!) 120, temperature 98.3 F (36.8 C), temperature source Oral, resp. rate 18, last menstrual period 02/18/1995, SpO2 97 %.There is no height or  weight on file to calculate BMI.  General Appearance: Casual  Eye Contact:  Good  Speech:  Clear and Coherent and Normal Rate  Volume:  Decreased  Mood:  Depressed  Affect:  Flat  Thought Process:  Coherent and Descriptions of Associations: Intact  Orientation:  Full (Time, Place, and Person)  Thought Content:  Hallucinations: Auditory  Suicidal Thoughts:  No  Homicidal Thoughts:  No  Memory:  Immediate;   Good Recent;   Good Remote;   Good  Judgement:  Fair  Insight:  Fair  Psychomotor Activity:  Normal  Concentration: Concentration: Good and Attention Span: Good  Recall:  Good  Fund of Knowledge:Good  Language: Good  Akathisia:  No  Handed:  Right  AIMS (if indicated):     Assets:  Communication Skills Desire for Improvement Financial Resources/Insurance Housing Resilience Social Support Transportation  Sleep:       Musculoskeletal: Strength & Muscle Tone: within normal limits Gait & Station: normal Patient leans: N/A  Blood pressure 135/84, pulse (!) 120, temperature 98.3 F (36.8 C), temperature source Oral, resp. rate 18, last menstrual period 02/18/1995, SpO2 97 %.  Recommendations:  Based on my evaluation the patient does not appear to have an emergency medical condition. however, patient needs to be medically cleared for possible admission to mental health Hospital.  Patient and patient's caregiver do report that patient has chronic UTIs and that they do cause her to have some similar symptoms.  Patient is also on constant O2 at 2 L and cannot be admitted to behavioral health Hospital with continuous oxygen.  [place], FNP 10/14/2018, 7:15 PM

## 2018-10-14 NOTE — Telephone Encounter (Signed)
Patient has been having audio hallucinations since late last week, patients niece Allison Sharp called today and I advised her to take patient to [facility] for an assessment. She would like to know if you can call and speak with them to try and keep her here in [place] if they decide to admit her. Last time patient was inpatient they sent her to [facility]. Please review, thank you

## 2018-10-14 NOTE — Telephone Encounter (Signed)
I spoke to assessment staff they may keep her on OBS if she meet criteria. They have beds but they are low staff.

## 2018-10-14 NOTE — ED Notes (Signed)
Pt's niece called stating that she is pt's POA and would like to be called when pt is seen.  [name] [phone]

## 2018-10-14 NOTE — ED Triage Notes (Signed)
States that she has been hearing voices x5 days - they started out loud but they are quieter now. She went to [facility] and they had her transported here.
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
## 2018-10-15 ENCOUNTER — Telehealth (HOSPITAL_COMMUNITY): Payer: Self-pay

## 2018-10-15 ENCOUNTER — Emergency Department (HOSPITAL_COMMUNITY): Payer: Medicare Other

## 2018-10-15 DIAGNOSIS — R0602 Shortness of breath: Secondary | ICD-10-CM | POA: Diagnosis not present

## 2018-10-15 LAB — URINALYSIS, ROUTINE W REFLEX MICROSCOPIC
Bilirubin Urine: NEGATIVE
Glucose, UA: NEGATIVE mg/dL
Hgb urine dipstick: NEGATIVE
Ketones, ur: NEGATIVE mg/dL
Nitrite: NEGATIVE
Protein, ur: NEGATIVE mg/dL
Specific Gravity, Urine: 1.026 (ref 1.005–1.030)
pH: 5 (ref 5.0–8.0)

## 2018-10-15 LAB — SARS CORONAVIRUS 2 BY RT PCR (HOSPITAL ORDER, PERFORMED IN ~~LOC~~ HOSPITAL LAB): SARS Coronavirus 2: NEGATIVE

## 2018-10-15 NOTE — ED Notes (Addendum)
Pt states "I'm seeing things again. I wanted to let you know. I'm seeing people that I don't recognize and I think that's why I'm not sleeping. I'm trying to keep up w/them so I could let you know so you can make them go away". Pt returned to eating lunch - does not appear to be in distress.

## 2018-10-15 NOTE — Telephone Encounter (Signed)
I called [facility] emergency room at [phone].  Unable to hold Dr. Loman Brooklyn as patient is not under care of Dr. Gara Kroner.  I spoke to Dr. Laverta Baltimore and he is still considering inpatient.

## 2018-10-15 NOTE — Telephone Encounter (Signed)
Patient's niece called to inform us that patient was taken to [facility] but they would not take her due to continual oxygen. They recommended she go to [place] or [place]. She was then taken to [facility] emergency department and the last attending there was Dr. Varney Biles. He would like her admitted to keep her there but would like to speak with you first. Niece stated that patient went to neurology but they found nothing and is waiting to get scheduled for an MRI scan. She also stated that the risperidone she is taking diminishes the voices but does not get rid of them. Niece gives patient 2mg  1 tab po qd prn and 4mg  1 tab po qhs as prescribed. Thank you.

## 2018-10-15 NOTE — ED Notes (Signed)
Pt's niece, [name], called inquiring about an update for pt - advised her waiting for pt placement per pt's permission.

## 2018-10-15 NOTE — ED Notes (Signed)
Transported to X-Ray °

## 2018-10-15 NOTE — BH Assessment (Signed)
[place] Assessment Progress Note    Patient was seen this date for reassessment.  She states that she continues to hear voices in her head, but states that she continues to be unable to make out what they are saying.  Patient states that she did not sleep well last pm and states that she "tossed and turned all night."  Patient also states that she has a decreased appetite.  Patient denies SI/HI.  TTS and Social Work to continue to seek placement for patient into a geriatric psychiatric unit.

## 2018-10-15 NOTE — ED Notes (Signed)
5/19: Pt has been accepted at [facility]. This information was provided to pt's nurse, Beckie Busing, at 2008.  Room: 415A Accepting: Dr. Ananias Pilgrim Attending: Not yet assigned Call to Report: [phone]  The pt can arrive at any time.

## 2018-10-15 NOTE — BH Assessment (Signed)
[place] Assessment Progress Note   TTS contacted patient's niece, [name], to advise her that geriatric inpatient placement is being explored and referrals made.  Patient's niece states that she would like for patient to be admitted medically rather than being sent to a geriatric inpatient center and have her medications adjusted at [facility].  TTS explained to patient's niece that patient cannot be admitted to the hospital without a medical need for admission.  Niece states that she would rather her hold in the ED and have her medication adjusted and discharge her home rather than sending her to another hospital because she states that it makes her more confused when she returns home from these facilities.

## 2018-10-15 NOTE — ED Notes (Signed)
Lunch order placed

## 2018-10-15 NOTE — ED Provider Notes (Signed)
Emergency Medicine Observation Re-evaluation Note  Allison Sharp is a 63 y.o. female, seen on rounds today.  Pt initially presented to the ED for complaints of Hallucinations Currently, the patient is calm and appears relaxed.  She is sitting upright in bed.  She has already eaten breakfast.  She states she did not sleep well last night because she was tossing and turning.  She denies any additional complaints.  Physical Exam  BP (!) 103/56 (BP Location: Right Wrist)   Pulse 80   Temp 98 F (36.7 C) (Oral)   Resp 16   Ht 5\' 9"  (1.753 m)   Wt (!) 181.4 kg   LMP 02/18/1995   SpO2 97%   BMI 59.07 kg/m  Physical Exam Vitals signs and nursing note reviewed.  Constitutional:      General: She is not in acute distress.    Appearance: She is well-developed. She is obese. She is not diaphoretic.  HENT:     Head: Normocephalic and atraumatic.     Mouth/Throat:     Mouth: Mucous membranes are moist.     Pharynx: Oropharynx is clear.  Eyes:     Conjunctiva/sclera: Conjunctivae normal.  Neck:     Musculoskeletal: Neck supple.  Cardiovascular:     Rate and Rhythm: Normal rate and regular rhythm.     Pulses: Normal pulses.          Radial pulses are 2+ on the right side and 2+ on the left side.       Posterior tibial pulses are 2+ on the right side and 2+ on the left side.     Heart sounds: Normal heart sounds.     Comments: Tactile temperature in the extremities appropriate and equal bilaterally. Pulmonary:     Effort: Pulmonary effort is normal. No respiratory distress.     Breath sounds: Normal breath sounds.  Abdominal:     Palpations: Abdomen is soft.     Tenderness: There is no abdominal tenderness. There is no guarding.  Musculoskeletal:     Right lower leg: No edema.     Left lower leg: No edema.  Lymphadenopathy:     Cervical: No cervical adenopathy.  Skin:    General: Skin is warm and dry.  Neurological:     Mental Status: She is alert.  Psychiatric:        Mood and  Affect: Mood and affect normal.        Speech: Speech normal.        Behavior: Behavior normal.     ED Course / MDM  SAY:TKZS Clinical Course as of Oct 14 1009  Tue Oct 15, 2018  1010 Patient denies urinary symptoms.  Urinalysis, Routine w reflex microscopic(!) [SJ]    Clinical Course User Index [SJ] Kynesha Guerin C, PA-C     Abnormal Labs Reviewed  COMPREHENSIVE METABOLIC PANEL - Abnormal; Notable for the following components:      Result Value   Glucose, Bld 141 (*)    Total Protein 6.4 (*)    All other components within normal limits  ACETAMINOPHEN LEVEL - Abnormal; Notable for the following components:   Acetaminophen (Tylenol), Serum <10 (*)    All other components within normal limits  CBC - Abnormal; Notable for the following components:   Platelets 104 (*)    All other components within normal limits  URINALYSIS, ROUTINE W REFLEX MICROSCOPIC - Abnormal; Notable for the following components:   Color, Urine AMBER (*)  APPearance CLOUDY (*)    Leukocytes,Ua SMALL (*)    Bacteria, UA RARE (*)    All other components within normal limits   I have reviewed the labs performed to date as well as medications administered while in observation.  Thrombocytopenia with platelets at 104 was noted, however, this appears to be a chronic problem with previous values even lower. No noted changes in the last 24 hours. Plan  Current plan is awaiting placement for inpatient geriatric psych.  As of this morning, social work is reaching out to multiple facilities for possible placement.  Patient's home medications have already previously been ordered.  Patient is not under full IVC at this time.      Vitals:   10/14/18 1928 10/14/18 1933 10/15/18 0522  BP:  135/79 (!) 103/56  Pulse:  95 80  Resp:  16 16  Temp:  98.2 F (36.8 C) 98 F (36.7 C)  TempSrc:  Oral Oral  SpO2:  95% 97%  Weight: (!) 181.4 kg    Height: 5\' 9"  (1.753 m)        Lorayne Bender, PA-C 10/15/18 1223     Virgel Manifold, MD 10/16/18 1221

## 2018-10-15 NOTE — ED Notes (Signed)
Patient's belongings inventoried and placed in locker # 6-Monique,RN  

## 2018-10-15 NOTE — ED Notes (Signed)
Pt ambulated to bathroom and back to room w/o difficulty.  

## 2018-10-15 NOTE — ED Notes (Addendum)
Pt ate dinner - voiced understanding and agreement w/tx plan - Accepted to [facility] - Pt signed consent form - copy faxed to [facility], copy sent to Medical Records, and original placed in envelope for [place].

## 2018-10-15 NOTE — Progress Notes (Signed)
Pt. meets criteria for inpatient treatment per Mordecai Maes, NP.  No appropriate beds available at [facility]. Referred out to the following hospitals:  [facility]     Disposition CSW will continue to follow for placement.  Areatha Keas. Judi Cong, MSW, [place] Disposition Clinical Social Work [phone] (cell) [phone] (office)

## 2018-10-15 NOTE — ED Notes (Signed)
Lunch @ bedside. 

## 2018-10-15 NOTE — ED Notes (Signed)
Pt given saltine crackers @ snack time.

## 2018-10-16 DIAGNOSIS — F333 Major depressive disorder, recurrent, severe with psychotic symptoms: Secondary | ICD-10-CM | POA: Insufficient documentation

## 2018-10-17 DIAGNOSIS — T7840XA Allergy, unspecified, initial encounter: Secondary | ICD-10-CM | POA: Insufficient documentation

## 2018-10-17 DIAGNOSIS — K219 Gastro-esophageal reflux disease without esophagitis: Secondary | ICD-10-CM | POA: Diagnosis not present

## 2018-10-17 DIAGNOSIS — I10 Essential (primary) hypertension: Secondary | ICD-10-CM | POA: Diagnosis not present

## 2018-10-17 DIAGNOSIS — I252 Old myocardial infarction: Secondary | ICD-10-CM | POA: Diagnosis not present

## 2018-10-17 DIAGNOSIS — J449 Chronic obstructive pulmonary disease, unspecified: Secondary | ICD-10-CM | POA: Diagnosis not present

## 2018-10-19 DIAGNOSIS — I10 Essential (primary) hypertension: Secondary | ICD-10-CM | POA: Diagnosis not present

## 2018-10-19 DIAGNOSIS — J449 Chronic obstructive pulmonary disease, unspecified: Secondary | ICD-10-CM | POA: Diagnosis not present

## 2018-10-19 DIAGNOSIS — K219 Gastro-esophageal reflux disease without esophagitis: Secondary | ICD-10-CM | POA: Diagnosis not present

## 2018-10-19 DIAGNOSIS — I252 Old myocardial infarction: Secondary | ICD-10-CM | POA: Diagnosis not present

## 2018-10-21 DIAGNOSIS — I10 Essential (primary) hypertension: Secondary | ICD-10-CM | POA: Diagnosis not present

## 2018-10-21 DIAGNOSIS — J449 Chronic obstructive pulmonary disease, unspecified: Secondary | ICD-10-CM | POA: Diagnosis not present

## 2018-10-21 DIAGNOSIS — K219 Gastro-esophageal reflux disease without esophagitis: Secondary | ICD-10-CM | POA: Diagnosis not present

## 2018-10-21 DIAGNOSIS — I252 Old myocardial infarction: Secondary | ICD-10-CM | POA: Diagnosis not present

## 2018-10-22 DIAGNOSIS — I10 Essential (primary) hypertension: Secondary | ICD-10-CM | POA: Diagnosis not present

## 2018-10-22 DIAGNOSIS — K219 Gastro-esophageal reflux disease without esophagitis: Secondary | ICD-10-CM | POA: Diagnosis not present

## 2018-10-22 DIAGNOSIS — J449 Chronic obstructive pulmonary disease, unspecified: Secondary | ICD-10-CM | POA: Diagnosis not present

## 2018-10-22 DIAGNOSIS — I252 Old myocardial infarction: Secondary | ICD-10-CM | POA: Diagnosis not present

## 2018-10-23 ENCOUNTER — Other Ambulatory Visit: Payer: Self-pay | Admitting: Family Medicine

## 2018-10-24 ENCOUNTER — Other Ambulatory Visit: Payer: Self-pay | Admitting: Family Medicine

## 2018-10-24 ENCOUNTER — Telehealth: Payer: Self-pay | Admitting: Family Medicine

## 2018-10-24 NOTE — Telephone Encounter (Signed)
Patient has a hospital follow up appointment scheduled.

## 2018-10-25 ENCOUNTER — Ambulatory Visit (INDEPENDENT_AMBULATORY_CARE_PROVIDER_SITE_OTHER): Payer: Medicare Other | Admitting: Family Medicine

## 2018-10-25 ENCOUNTER — Other Ambulatory Visit: Payer: Self-pay

## 2018-10-25 ENCOUNTER — Encounter: Payer: Self-pay | Admitting: Family Medicine

## 2018-10-25 VITALS — BP 117/78 | HR 94 | Temp 96.9°F | Ht 69.0 in | Wt 393.8 lb

## 2018-10-25 DIAGNOSIS — E559 Vitamin D deficiency, unspecified: Secondary | ICD-10-CM

## 2018-10-25 DIAGNOSIS — E039 Hypothyroidism, unspecified: Secondary | ICD-10-CM | POA: Diagnosis not present

## 2018-10-25 DIAGNOSIS — F323 Major depressive disorder, single episode, severe with psychotic features: Secondary | ICD-10-CM | POA: Diagnosis not present

## 2018-10-25 MED ORDER — VITAMIN D (ERGOCALCIFEROL) 1.25 MG (50000 UNIT) PO CAPS: 50000.0000 [IU] | ORAL_CAPSULE | ORAL | 0 refills | Status: DC

## 2018-10-25 NOTE — Progress Notes (Signed)
BP 117/78   Pulse 94   Temp (!) 96.9 F (36.1 C) (Oral)   Ht 5' 9" (1.753 m)   Wt (!) 393 lb 12.8 oz (178.6 kg)   LMP 02/18/1995   BMI 58.15 kg/m    Subjective:   Patient ID: Allison Sharp, female    DOB: [birth year], 63 y.o.   MRN: [id number]  HPI: Allison Sharp is a 63 y.o. female presenting on 10/25/2018 for Hallucinations (hospital follow up - [facility] 5/18)   HPI Hospital follow-up for major depression with psychotic features and hallucinations Patient was in the hospital with auditory hallucinations and saw psychiatry and during the visit was also found to have need for adjustment in her thyroid medication.  She was also recommended to go to the lymphedema clinic which she already does have a referral in place for it and they will call up there.  She has follow-up with psych and denies having any auditory hallucinations since leaving the hospital.  She says since leaving the hospital she has not had any more auditory hallucinations.  She was in the hospital on 10/14/2018 and discharged on 10/15/2018  They did adjust her thyroid dose to 112 mcg and she has been taking that since she left the hospital.  Relevant past medical, surgical, family and social history reviewed and updated as indicated. Interim medical history since our last visit reviewed. Allergies and medications reviewed and updated.  Review of Systems  Constitutional: Negative for chills and fever.  HENT: Negative for congestion, ear discharge and ear pain.   Eyes: Negative for redness and visual disturbance.  Respiratory: Negative for chest tightness and shortness of breath.   Cardiovascular: Positive for leg swelling. Negative for chest pain.  Genitourinary: Negative for difficulty urinating and dysuria.  Musculoskeletal: Negative for back pain and gait problem.  Skin: Negative for rash.  Neurological: Negative for dizziness, light-headedness and headaches.  Psychiatric/Behavioral: Negative for agitation and  behavioral problems.  All other systems reviewed and are negative.   Per HPI unless specifically indicated above   Allergies as of 10/25/2018      Reactions   Abilify [aripiprazole] Other (See Comments)   hallucinations   Naproxen Nausea And Vomiting, Swelling   Ativan [lorazepam] Other (See Comments)   Delirium   Haldol [haloperidol] Other (See Comments)   Hallucinating    Hydroxyzine Other (See Comments)   hallucinations      Medication List       Accurate as of Oct 25, 2018  3:50 PM. If you have any questions, ask your nurse or doctor.        albuterol (2.5 MG/3ML) 0.083% nebulizer solution Commonly known as:  PROVENTIL Take 3 mLs (2.5 mg total) by nebulization every 4 (four) hours as needed for wheezing or shortness of breath.   albuterol 108 (90 Base) MCG/ACT inhaler Commonly known as:  ProAir HFA USE 2 PUFF EVERY 4 HOURS AS NEEDED FOR WHEEZING OR SHORTNESS OF BREATH   atorvastatin 20 MG tablet Commonly known as:  LIPITOR Take 1 tablet (20 mg total) by mouth daily.   benztropine 1 MG tablet Commonly known as:  COGENTIN Take 1 tablet (1 mg total) by mouth at bedtime.   Breo Ellipta 100-25 MCG/INH Aepb Generic drug:  fluticasone furoate-vilanterol INHALE 1 PUFF INTO THE LUNGS DAILY   celecoxib 200 MG capsule Commonly known as:  CELEBREX TAKE (1) CAPSULE TWICE DAILY.   cetirizine 10 MG tablet Commonly known as:  ZYRTEC TAKE 1 TABLET ONCE  DAILY   cloNIDine 0.1 MG tablet Commonly known as:  CATAPRES TAKE ONE TABLET AT BEDTIME   diclofenac sodium 1 % Gel Commonly known as:  Voltaren Apply to affected area twice daily as needed What changed:    how much to take  how to take this  when to take this  reasons to take this  additional instructions   divalproex 500 MG DR tablet Commonly known as:  DEPAKOTE Take 3 tablets (1,500 mg total) by mouth at bedtime.   DULoxetine 60 MG capsule Commonly known as:  CYMBALTA Take 1 capsule (60 mg total) by  mouth daily.   fluticasone 50 MCG/ACT nasal spray Commonly known as:  FLONASE Place 1 spray 2 (two) times daily as needed into both nostrils for allergies or rhinitis.   levothyroxine 25 MCG tablet Commonly known as:  SYNTHROID Take 1 tablet (25 mcg total) by mouth daily before breakfast.   levothyroxine 125 MCG tablet Commonly known as:  SYNTHROID TAKE (1) TABLET DAILY BE- FORE BREAKFAST.   nystatin cream Commonly known as:  MYCOSTATIN APPLY TO THE AFFECTED AREA THREE TIMES DAILY AS NEEDED ..FOR TOPICAL USE ONLY... What changed:  See the new instructions.   omeprazole 20 MG capsule Commonly known as:  PRILOSEC TAKE (1) CAPSULE DAILY What changed:  See the new instructions.   ondansetron 4 MG disintegrating tablet Commonly known as:  ZOFRAN-ODT DISSOLVE 1 TABLET IN MOUTH EVERY 8 HOURS AS NEEDED FOR NAUSEA AND VOMITING   polyethylene glycol powder 17 GM/SCOOP powder Commonly known as:  GLYCOLAX/MIRALAX Take 17 g by mouth 2 (two) times daily as needed. What changed:  reasons to take this   promethazine 25 MG tablet Commonly known as:  PHENERGAN Take 1 tablet (25 mg total) by mouth every 8 (eight) hours as needed for nausea or vomiting.   risperiDONE 2 MG tablet Commonly known as:  RisperDAL Take 1 tablet (2 mg total) by mouth daily as needed. For auditory hallucintations What changed:    when to take this  Another medication with the same name was removed. Continue taking this medication, and follow the directions you see here.   therapeutic multivitamin-minerals tablet Take 1 tablet by mouth daily.   traMADol 50 MG tablet Commonly known as:  ULTRAM Take 1 tablet (50 mg total) by mouth every 8 (eight) hours as needed for moderate pain.   traZODone 50 MG tablet Commonly known as:  DESYREL Take by mouth.   Vitamin D (Ergocalciferol) 1.25 MG (50000 UT) Caps capsule Commonly known as:  DRISDOL Take 1 capsule (50,000 Units total) by mouth every 7 (seven) days.  Started by:  Joshua A Dettinger, MD        Objective:   BP 117/78   Pulse 94   Temp (!) 96.9 F (36.1 C) (Oral)   Ht 5' 9" (1.753 m)   Wt (!) 393 lb 12.8 oz (178.6 kg)   LMP 02/18/1995   BMI 58.15 kg/m   Wt Readings from Last 3 Encounters:  10/25/18 (!) 393 lb 12.8 oz (178.6 kg)  10/14/18 (!) 400 lb (181.4 kg)  09/30/18 (!) 390 lb (176.9 kg)    Physical Exam Vitals signs and nursing note reviewed.  Constitutional:      General: She is not in acute distress.    Appearance: She is well-developed. She is not diaphoretic.  Eyes:     Conjunctiva/sclera: Conjunctivae normal.  Cardiovascular:     Rate and Rhythm: Normal rate and regular rhythm.       Heart sounds: Normal heart sounds. No murmur.  Pulmonary:     Effort: Pulmonary effort is normal. No respiratory distress.     Breath sounds: Normal breath sounds. No wheezing.  Musculoskeletal: Normal range of motion.        General: Swelling (Nonpitting chronic edema in bilateral lower extremities) present. No tenderness.  Skin:    General: Skin is warm and dry.     Findings: No rash.  Neurological:     Mental Status: She is alert and oriented to person, place, and time.     Coordination: Coordination normal.  Psychiatric:        Behavior: Behavior normal.       Assessment & Plan:   Problem List Items Addressed This Visit      Endocrine   Hypothyroidism (Chronic)   Relevant Orders   TSH     Other   Vitamin D insufficiency (Chronic)   Relevant Medications   Vitamin D, Ergocalciferol, (DRISDOL) 1.25 MG (50000 UT) CAPS capsule   MDD (major depressive disorder), single episode, severe with psychotic features (HCC) - Primary   Relevant Medications   traZODone (DESYREL) 50 MG tablet   Other Relevant Orders   CBC with Differential/Platelet   CMP14+EGFR      Will check TSH and for now keep 112 mcg but if needs to be increased we will on Monday when the lab results come back. Follow up plan: Return in about 3  months (around 01/25/2019), or if symptoms worsen or fail to improve, for Thyroid recheck.  Counseling provided for all of the vaccine components Orders Placed This Encounter  Procedures  . CBC with Differential/Platelet  . CMP14+EGFR  . TSH    Joshua Dettinger, MD [facility] 10/25/2018, 3:50 PM     

## 2018-10-26 LAB — CMP14+EGFR
ALT: 13 IU/L (ref 0–32)
AST: 17 IU/L (ref 0–40)
Albumin/Globulin Ratio: 1.9 (ref 1.2–2.2)
Albumin: 4.1 g/dL (ref 3.8–4.8)
Alkaline Phosphatase: 73 IU/L (ref 39–117)
BUN/Creatinine Ratio: 17 (ref 12–28)
BUN: 15 mg/dL (ref 8–27)
Bilirubin Total: 0.3 mg/dL (ref 0.0–1.2)
CO2: 26 mmol/L (ref 20–29)
Calcium: 9.5 mg/dL (ref 8.7–10.3)
Chloride: 101 mmol/L (ref 96–106)
Creatinine, Ser: 0.89 mg/dL (ref 0.57–1.00)
GFR calc Af Amer: 79 mL/min/{1.73_m2} (ref >59)
GFR calc non Af Amer: 69 mL/min/{1.73_m2} (ref >59)
Globulin, Total: 2.2 g/dL (ref 1.5–4.5)
Glucose: 97 mg/dL (ref 65–99)
Potassium: 4.3 mmol/L (ref 3.5–5.2)
Sodium: 142 mmol/L (ref 134–144)
Total Protein: 6.3 g/dL (ref 6.0–8.5)

## 2018-10-26 LAB — CBC WITH DIFFERENTIAL/PLATELET
Basophils Absolute: 0 10*3/uL (ref 0.0–0.2)
Basos: 0 %
EOS (ABSOLUTE): 0.2 10*3/uL (ref 0.0–0.4)
Eos: 3 %
Hematocrit: 36.9 % (ref 34.0–46.6)
Hemoglobin: 12.7 g/dL (ref 11.1–15.9)
Immature Grans (Abs): 0 10*3/uL (ref 0.0–0.1)
Immature Granulocytes: 0 %
Lymphocytes Absolute: 2.2 10*3/uL (ref 0.7–3.1)
Lymphs: 27 %
MCH: 31.1 pg (ref 26.6–33.0)
MCHC: 34.4 g/dL (ref 31.5–35.7)
MCV: 90 fL (ref 79–97)
Monocytes Absolute: 0.7 10*3/uL (ref 0.1–0.9)
Monocytes: 9 %
Neutrophils Absolute: 5.1 10*3/uL (ref 1.4–7.0)
Neutrophils: 61 %
Platelets: 121 10*3/uL — ABNORMAL LOW (ref 150–450)
RBC: 4.08 x10E6/uL (ref 3.77–5.28)
RDW: 14 % (ref 11.7–15.4)
WBC: 8.3 10*3/uL (ref 3.4–10.8)

## 2018-10-26 LAB — TSH: TSH: 3.47 u[IU]/mL (ref 0.450–4.500)

## 2018-10-29 ENCOUNTER — Ambulatory Visit (HOSPITAL_COMMUNITY): Payer: Medicare Other | Admitting: Psychiatry

## 2018-11-05 ENCOUNTER — Ambulatory Visit (INDEPENDENT_AMBULATORY_CARE_PROVIDER_SITE_OTHER): Payer: Medicare Other | Admitting: Psychiatry

## 2018-11-05 ENCOUNTER — Other Ambulatory Visit: Payer: Self-pay

## 2018-11-05 DIAGNOSIS — G3184 Mild cognitive impairment, so stated: Secondary | ICD-10-CM

## 2018-11-05 DIAGNOSIS — F3160 Bipolar disorder, current episode mixed, unspecified: Secondary | ICD-10-CM | POA: Diagnosis not present

## 2018-11-05 DIAGNOSIS — F411 Generalized anxiety disorder: Secondary | ICD-10-CM | POA: Diagnosis not present

## 2018-11-05 MED ORDER — TRAZODONE HCL 50 MG PO TABS: 50.0000 mg | ORAL_TABLET | Freq: Every day | ORAL | 1 refills | Status: DC

## 2018-11-05 MED ORDER — RISPERIDONE 2 MG PO TABS: 2.0000 mg | ORAL_TABLET | Freq: Two times a day (BID) | ORAL | 1 refills | Status: DC

## 2018-11-05 MED ORDER — DULOXETINE HCL 60 MG PO CPEP: 60.0000 mg | ORAL_CAPSULE | Freq: Every day | ORAL | 1 refills | Status: DC

## 2018-11-05 MED ORDER — DIVALPROEX SODIUM 500 MG PO DR TAB: 1500.0000 mg | DELAYED_RELEASE_TABLET | Freq: Every day | ORAL | 1 refills | Status: DC

## 2018-11-05 MED ORDER — BENZTROPINE MESYLATE 1 MG PO TABS: 1.0000 mg | ORAL_TABLET | Freq: Every day | ORAL | 1 refills | Status: DC

## 2018-11-05 NOTE — Progress Notes (Signed)
Virtual Visit via Telephone Note  I connected with Allison Sharp on 11/05/18 at  3:00 PM EDT by telephone and verified that I am speaking with the correct person using two identifiers.   I discussed the limitations, risks, security and privacy concerns of performing an evaluation and management service by telephone and the availability of in person appointments. I also discussed with the patient that there may be a patient responsible charge related to this service. The patient expressed understanding and agreed to proceed.   History of Present Illness: Patient was evaluated by phone session.  She was recently admitted to [facility] due to psychosis.  Patient was having hallucination, nervousness and she was scared by the voices.  I spoke to her niece any who mentioned that prior to admission she has disturbed sleep cycle as she was watching TV very late and then she started to have voices.  She was taken to the [facility] emergency room and she had blood work which was normal.  Later she was admitted to [facility] as patient continued to have hallucination.  In the hospital her medicines were adjusted and rather taking Risperdal 4 mg in the morning it was divided into 2 mg in the morning and 2 mg at night and trazodone 50 mg added at bedtime.  [name] but also complaining of feeling tired, fatigue and lack of energy.  She has no longer having hallucination or any paranoia.  She still feels anxious and sometimes nervous due to COVID-19 but denies any panic attack or any suicidal thoughts.  She has mild cognitive impairment and most of the information was obtained through her niece but she was there to provide additional information.  Now her sleep cycle is much better and she is no longer watching late-night TV.  She admitted weight loss as she is trying to lose weight.  She has history of chest pain and shortness of breath and her daughter recommended to lose  weight.  She has mild tremors which are chronic but otherwise no major concerns or side effects.  Her Depakote, Cogentin and Cymbalta remain the same.  Recently she seen her physician for TSH and now it is slowly getting normal.  Her thyroid medicines were also adjusted.  Patient denies any crying spells or any feeling of hopelessness or worthlessness.  She has multiple health issues.  She like to continue current medication and does not want to change the dose.  Her niece is very supportive.    Past Psychiatric History:Reviewed. H/O psychiatric illness with multiple inpatient treatment. Last admissions inMay 2020 at [facility]. H/O suicidal attempt by taking overdose. Diagnosed with PTSD, depression, bipolar disorder. Tried Paxil, Prozac, Wellbutrin, Effexor, Lexapro, amitriptyline, Cymbalta, Abilify (Tremors), Neurontin, trazodone, Thorazine, hydroxyzine, Luvox, Sinequan, Zyprexa and Latuda. Best responded on Risperdal and Cymbalta.   Recent Results (from the past 2160 hour(s))  Occult blood card to lab, stool     Status: None   Collection Time: 08/07/18  3:50 PM  Result Value Ref Range   Fecal Occult Bld NEGATIVE NEGATIVE    Comment: Performed at [facility], [place] [address] [place], [place] [address]  Hemoglobin A1c     Status: None   Collection Time: 08/08/18  5:48 AM  Result Value Ref Range   Hgb A1c MFr Bld 5.3 4.8 - 5.6 %    Comment: (NOTE) Pre diabetes:          5.7%-6.4% Diabetes:              >  6.4% Glycemic control for   <7.0% adults with diabetes    Mean Plasma Glucose 105.41 mg/dL    Comment: Performed at  [address], [place], [place] [address]  Lipid panel     Status: Abnormal   Collection Time: 08/08/18  5:48 AM  Result Value Ref Range   Cholesterol 137 0 - 200 mg/dL   Triglycerides 117 <150 mg/dL   HDL 32 (L) >40 mg/dL   Total CHOL/HDL Ratio 4.3 RATIO   VLDL 23 0 - 40 mg/dL   LDL Cholesterol 82 0 - 99 mg/dL     Comment:        Total Cholesterol/HDL:CHD Risk Coronary Heart Disease Risk Table                     Men   Women  1/2 Average Risk   3.4   3.3  Average Risk       5.0   4.4  2 X Average Risk   9.6   7.1  3 X Average Risk  23.4   11.0        Use the calculated Patient Ratio above and the CHD Risk Table to determine the patient's CHD Risk.        ATP III CLASSIFICATION (LDL):  <100     mg/dL   Optimal  100-129  mg/dL   Near or Above                    Optimal  130-159  mg/dL   Borderline  160-189  mg/dL   High  >190     mg/dL   Very High Performed at [place] [address], [place], [place] [address]   Basic metabolic panel     Status: Abnormal   Collection Time: 08/08/18  5:48 AM  Result Value Ref Range   Sodium 141 135 - 145 mmol/L   Potassium 3.7 3.5 - 5.1 mmol/L   Chloride 108 98 - 111 mmol/L   CO2 27 22 - 32 mmol/L   Glucose, Bld 95 70 - 99 mg/dL   BUN 12 8 - 23 mg/dL   Creatinine, Ser 0.85 0.44 - 1.00 mg/dL   Calcium 8.8 (L) 8.9 - 10.3 mg/dL   GFR calc non Af Amer >60 >60 mL/min   GFR calc Af Amer >60 >60 mL/min   Anion gap 6 5 - 15    Comment: Performed at [facility], [place] [address] [place], [place] [address]  CBC     Status: Abnormal   Collection Time: 08/08/18  5:48 AM  Result Value Ref Range   WBC 5.5 4.0 - 10.5 K/uL   RBC 3.63 (L) 3.87 - 5.11 MIL/uL   Hemoglobin 11.3 (L) 12.0 - 15.0 g/dL   HCT 35.9 (L) 36.0 - 46.0 %   MCV 98.9 80.0 - 100.0 fL   MCH 31.1 26.0 - 34.0 pg   MCHC 31.5 30.0 - 36.0 g/dL   RDW 12.7 11.5 - 15.5 %   Platelets 98 (L) 150 - 400 K/uL    Comment: REPEATED TO VERIFY Immature Platelet Fraction may be clinically indicated, consider ordering this additional test [id number] CONSISTENT WITH PREVIOUS RESULT    nRBC 0.0 0.0 - 0.2 %    Comment: Performed at [facility], [place] [address], [place], [place] [address]  Vitamin B12     Status: None   Collection Time: 09/25/18  10:01  AM  Result Value Ref Range   Vitamin B-12 370 232 - 1,245 pg/mL  Thyroid Panel With TSH     Status: Abnormal   Collection Time: 09/25/18 10:01 AM  Result Value Ref Range   TSH 6.830 (H) 0.450 - 4.500 uIU/mL   T4, Total 8.5 4.5 - 12.0 ug/dL   T3 Uptake Ratio 22 (L) 24 - 39 %   Free Thyroxine Index 1.9 1.2 - 4.9  Comprehensive metabolic panel     Status: Abnormal   Collection Time: 10/14/18  7:33 PM  Result Value Ref Range   Sodium 140 135 - 145 mmol/L   Potassium 3.9 3.5 - 5.1 mmol/L   Chloride 103 98 - 111 mmol/L   CO2 26 22 - 32 mmol/L   Glucose, Bld 141 (H) 70 - 99 mg/dL   BUN 13 8 - 23 mg/dL   Creatinine, Ser 0.91 0.44 - 1.00 mg/dL   Calcium 9.3 8.9 - 10.3 mg/dL   Total Protein 6.4 (L) 6.5 - 8.1 g/dL   Albumin 3.5 3.5 - 5.0 g/dL   AST 15 15 - 41 U/L   ALT 15 0 - 44 U/L   Alkaline Phosphatase 69 38 - 126 U/L   Total Bilirubin 0.7 0.3 - 1.2 mg/dL   GFR calc non Af Amer >60 >60 mL/min   GFR calc Af Amer >60 >60 mL/min   Anion gap 11 5 - 15    Comment: Performed at [facility], [address] [place], [place] [address]  Ethanol     Status: None   Collection Time: 10/14/18  7:33 PM  Result Value Ref Range   Alcohol, Ethyl (B) <10 <10 mg/dL    Comment: (NOTE) Lowest detectable limit for serum alcohol is 10 mg/dL. For medical purposes only. Performed at [facility], [place] [address], [place], [place] [address]   Salicylate level     Status: None   Collection Time: 10/14/18  7:33 PM  Result Value Ref Range   Salicylate Lvl <9.4 2.8 - 30.0 mg/dL    Comment: Performed at [place] [address], [place], [place] [address]  Acetaminophen level     Status: Abnormal   Collection Time: 10/14/18  7:33 PM  Result Value Ref Range   Acetaminophen (Tylenol), Serum <10 (L) 10 - 30 ug/mL    Comment: (NOTE) Therapeutic concentrations vary significantly. A range of 10-30 ug/mL  may be an effective concentration for many patients. However, some  are best  treated at concentrations outside of this range. Acetaminophen concentrations >150 ug/mL at 4 hours after ingestion  and >50 ug/mL at 12 hours after ingestion are often associated with  toxic reactions. Performed at [facility], [place] [address] [place], [place] [address]   cbc     Status: Abnormal   Collection Time: 10/14/18  7:33 PM  Result Value Ref Range   WBC 7.2 4.0 - 10.5 K/uL   RBC 4.11 3.87 - 5.11 MIL/uL   Hemoglobin 12.7 12.0 - 15.0 g/dL   HCT 38.8 36.0 - 46.0 %   MCV 94.4 80.0 - 100.0 fL   MCH 30.9 26.0 - 34.0 pg   MCHC 32.7 30.0 - 36.0 g/dL   RDW 13.0 11.5 - 15.5 %   Platelets 104 (L) 150 - 400 K/uL    Comment: REPEATED TO VERIFY PLATELET COUNT CONFIRMED BY SMEAR Immature Platelet Fraction may be clinically indicated, consider ordering this additional test [id number]  nRBC 0.0 0.0 - 0.2 %    Comment: Performed at [facility], [place] [address] [place], [place] [address]  Rapid urine drug screen (hospital performed)     Status: None   Collection Time: 10/14/18 10:52 PM  Result Value Ref Range   Opiates NONE DETECTED NONE DETECTED   Cocaine NONE DETECTED NONE DETECTED   Benzodiazepines NONE DETECTED NONE DETECTED   Amphetamines NONE DETECTED NONE DETECTED   Tetrahydrocannabinol NONE DETECTED NONE DETECTED   Barbiturates NONE DETECTED NONE DETECTED    Comment: (NOTE) DRUG SCREEN FOR MEDICAL PURPOSES ONLY.  IF CONFIRMATION IS NEEDED FOR ANY PURPOSE, NOTIFY LAB WITHIN 5 DAYS. LOWEST DETECTABLE LIMITS FOR URINE DRUG SCREEN Drug Class                     Cutoff (ng/mL) Amphetamine and metabolites    1000 Barbiturate and metabolites    200 Benzodiazepine                 409 Tricyclics and metabolites     300 Opiates and metabolites        300 Cocaine and metabolites        300 THC                            50 Performed at [facility], [place] [address], [place], [place] [address]   Urinalysis, Routine w reflex microscopic     Status: Abnormal    Collection Time: 10/14/18 10:52 PM  Result Value Ref Range   Color, Urine AMBER (A) YELLOW    Comment: BIOCHEMICALS MAY BE AFFECTED BY COLOR   APPearance CLOUDY (A) CLEAR   Specific Gravity, Urine 1.026 1.005 - 1.030   pH 5.0 5.0 - 8.0   Glucose, UA NEGATIVE NEGATIVE mg/dL   Hgb urine dipstick NEGATIVE NEGATIVE   Bilirubin Urine NEGATIVE NEGATIVE   Ketones, ur NEGATIVE NEGATIVE mg/dL   Protein, ur NEGATIVE NEGATIVE mg/dL   Nitrite NEGATIVE NEGATIVE   Leukocytes,Ua SMALL (A) NEGATIVE   RBC / HPF 0-5 0 - 5 RBC/hpf   WBC, UA 21-50 0 - 5 WBC/hpf   Bacteria, UA RARE (A) NONE SEEN   Squamous Epithelial / LPF 0-5 0 - 5   Mucus PRESENT     Comment: Performed at [facility], [address], [place], [place] [address]  SARS Coronavirus 2 (CEPHEID - Performed in [place] hospital lab), Hosp Order     Status: None   Collection Time: 10/15/18  5:24 AM  Result Value Ref Range   SARS Coronavirus 2 NEGATIVE NEGATIVE    Comment: (NOTE) If result is NEGATIVE SARS-CoV-2 target nucleic acids are NOT DETECTED. The SARS-CoV-2 RNA is generally detectable in upper and lower  respiratory specimens during the acute phase of infection. The lowest  concentration of SARS-CoV-2 viral copies this assay can detect is 250  copies / mL. A negative result does not preclude SARS-CoV-2 infection  and should not be used as the sole basis for treatment or other  patient management decisions.  A negative result may occur with  improper specimen collection / handling, submission of specimen other  than nasopharyngeal swab, presence of viral mutation(s) within the  areas targeted by this assay, and inadequate number of viral copies  (<250 copies / mL). A negative result must be combined with clinical  observations, patient history, and epidemiological information. If result is POSITIVE  SARS-CoV-2 target nucleic acids are DETECTED. The SARS-CoV-2 RNA is generally detectable in upper and lower  respiratory  specimens dur ing the acute phase of infection.  Positive  results are indicative of active infection with SARS-CoV-2.  Clinical  correlation with patient history and other diagnostic information is  necessary to determine patient infection status.  Positive results do  not rule out bacterial infection or co-infection with other viruses. If result is PRESUMPTIVE POSTIVE SARS-CoV-2 nucleic acids MAY BE PRESENT.   A presumptive positive result was obtained on the submitted specimen  and confirmed on repeat testing.  While 2019 novel coronavirus  (SARS-CoV-2) nucleic acids may be present in the submitted sample  additional confirmatory testing may be necessary for epidemiological  and / or clinical management purposes  to differentiate between  SARS-CoV-2 and other Sarbecovirus currently known to infect humans.  If clinically indicated additional testing with an alternate test  methodology [id number]) is advised. The SARS-CoV-2 RNA is generally  detectable in upper and lower respiratory sp ecimens during the acute  phase of infection. The expected result is Negative. Fact Sheet for Patients:  StrictlyIdeas.no Fact Sheet for Healthcare Providers: BankingDealers.co.za This test is not yet approved or cleared by the [place] FDA and has been authorized for detection and/or diagnosis of SARS-CoV-2 by FDA under an Emergency Use Authorization (EUA).  This EUA will remain in effect (meaning this test can be used) for the duration of the COVID-19 declaration under Section 564(b)(1) of the Act, 21 U.S.C. section 360bbb-3(b)(1), unless the authorization is terminated or revoked sooner. Performed at [facility], [place] [address] [place], [place] [address]   CBC with Differential/Platelet     Status: Abnormal   Collection Time: 10/25/18  4:00 PM  Result Value Ref Range   WBC 8.3 3.4 - 10.8 x10E3/uL   RBC 4.08 3.77 - 5.28 x10E6/uL    Hemoglobin 12.7 11.1 - 15.9 g/dL   Hematocrit 36.9 34.0 - 46.6 %   MCV 90 79 - 97 fL   MCH 31.1 26.6 - 33.0 pg   MCHC 34.4 31.5 - 35.7 g/dL   RDW 14.0 11.7 - 15.4 %   Platelets 121 (L) 150 - 450 x10E3/uL   Neutrophils 61 Not Estab. %   Lymphs 27 Not Estab. %   Monocytes 9 Not Estab. %   Eos 3 Not Estab. %   Basos 0 Not Estab. %   Neutrophils Absolute 5.1 1.4 - 7.0 x10E3/uL   Lymphocytes Absolute 2.2 0.7 - 3.1 x10E3/uL   Monocytes Absolute 0.7 0.1 - 0.9 x10E3/uL   EOS (ABSOLUTE) 0.2 0.0 - 0.4 x10E3/uL   Basophils Absolute 0.0 0.0 - 0.2 x10E3/uL   Immature Granulocytes 0 Not Estab. %   Immature Grans (Abs) 0.0 0.0 - 0.1 x10E3/uL  CMP14+EGFR     Status: None   Collection Time: 10/25/18  4:00 PM  Result Value Ref Range   Glucose 97 65 - 99 mg/dL   BUN 15 8 - 27 mg/dL   Creatinine, Ser 0.89 0.57 - 1.00 mg/dL   GFR calc non Af Amer 69 >59 mL/min/1.73   GFR calc Af Amer 79 >59 mL/min/1.73   BUN/Creatinine Ratio 17 12 - 28   Sodium 142 134 - 144 mmol/L   Potassium 4.3 3.5 - 5.2 mmol/L   Chloride 101 96 - 106 mmol/L   CO2 26 20 - 29 mmol/L   Calcium 9.5 8.7 - 10.3 mg/dL   Total Protein 6.3 6.0 - 8.5 g/dL  Albumin 4.1 3.8 - 4.8 g/dL   Globulin, Total 2.2 1.5 - 4.5 g/dL   Albumin/Globulin Ratio 1.9 1.2 - 2.2   Bilirubin Total 0.3 0.0 - 1.2 mg/dL   Alkaline Phosphatase 73 39 - 117 IU/L   AST 17 0 - 40 IU/L   ALT 13 0 - 32 IU/L  TSH     Status: None   Collection Time: 10/25/18  4:00 PM  Result Value Ref Range   TSH 3.470 0.450 - 4.500 uIU/mL     Psychiatric Specialty Exam: Physical Exam  ROS  Last menstrual period 02/18/1995.There is no height or weight on file to calculate BMI.  General Appearance: NA  Eye Contact:  NA  Speech:  Slow  Volume:  Decreased  Mood:  tired  Affect:  NA  Thought Process:  Goal Directed  Orientation:  Full (Time, Place, and Person)  Thought Content:  Rumination  Suicidal Thoughts:  No  Homicidal Thoughts:  No  Memory:  Immediate;    Fair Recent;   Fair Remote;   Fair  Judgement:  Good  Insight:  Good  Psychomotor Activity:  NA  Concentration:  Concentration: Fair and Attention Span: Fair  Recall:  [employer] of Knowledge:  Fair  Language:  Good  Akathisia:  No  Handed:  Right  AIMS (if indicated):     Assets:  Communication Skills Desire for Improvement Housing Resilience Social Support  ADL's:  Intact  Cognition:  Impaired,  Mild  Sleep:   fair       Assessment and Plan: Bipolar disorder type I.  Generalized anxiety disorder.  Mild cognitive impairment.  I reviewed blood work results from [facility] emergency room and also from [facility].  Her Depakote level is 97.2 which was done on May 26.  I reviewed her medication.  She is now taking Risperdal 2 mg twice a day and trazodone 50 mg at bedtime for insomnia.  She continued on Cymbalta 60 mg a day, Depakote 500 mg in the morning and thousand milligrams at bedtime, Cogentin 1 mg at bedtime.  She is also taking clonidine for her blood pressure.  Discussed medication side effects and benefits.  Discussed polypharmacy that could be the reason of fatigue but patient does not want to change, reduce or stop any medication since she feels it is working well.  Recommended to call us back if she has any question or any concern.  Discussed safety concern that anytime having active suicidal thoughts or homicidal thoughts then she need to call 911 or go to local emergency room.  Follow-up in 2 months.  Follow Up Instructions:    I discussed the assessment and treatment plan with the patient. The patient was provided an opportunity to ask questions and all were answered. The patient agreed with the plan and demonstrated an understanding of the instructions.   The patient was advised to call back or seek an in-person evaluation if the symptoms worsen or if the condition fails to improve as anticipated.  I provided 25 minutes of non-face-to-face time during  this encounter.   Kathlee Nations, MD

## 2018-11-19 ENCOUNTER — Other Ambulatory Visit: Payer: Self-pay | Admitting: Family Medicine

## 2018-12-03 ENCOUNTER — Other Ambulatory Visit: Payer: Self-pay | Admitting: Family Medicine

## 2018-12-17 ENCOUNTER — Other Ambulatory Visit: Payer: Self-pay | Admitting: Family Medicine

## 2018-12-17 DIAGNOSIS — E559 Vitamin D deficiency, unspecified: Secondary | ICD-10-CM

## 2018-12-18 ENCOUNTER — Other Ambulatory Visit: Payer: Self-pay | Admitting: Family Medicine

## 2018-12-18 NOTE — Telephone Encounter (Signed)
TC about Trazadone is suppose to be 50 mg and if pt does not fall asleep in an hour she is to take another 50 mg per [facility].   Levothyroid 112 mcg request came from [facility] - RF denied. 125 & 25 mcg sent on 10/02/18

## 2018-12-19 ENCOUNTER — Telehealth: Payer: Self-pay | Admitting: Family Medicine

## 2018-12-19 DIAGNOSIS — F3160 Bipolar disorder, current episode mixed, unspecified: Secondary | ICD-10-CM

## 2018-12-19 DIAGNOSIS — F411 Generalized anxiety disorder: Secondary | ICD-10-CM

## 2018-12-19 MED ORDER — LEVOTHYROXINE SODIUM 112 MCG PO TABS: 112.0000 ug | ORAL_TABLET | Freq: Every day | ORAL | 0 refills | Status: DC

## 2018-12-19 MED ORDER — TRAZODONE HCL 50 MG PO TABS: 50.0000 mg | ORAL_TABLET | Freq: Every day | ORAL | 3 refills | Status: DC

## 2018-12-19 NOTE — Telephone Encounter (Signed)
1--- Mental health 5/22 had thyroid changed to 112 mcg  and this was not fixed on [facility] side at 5/29 appt.  Thyroid was normal that day and wrong dose was sent to pharm = this has been fixed at pharm and in [place].  2--- Mental Health also changed her Trazodone 50mg  to say 1-2 QHS at bedtime and this was not fixed on [facility] side. Last refill still said 1 QHS - and they are running out - MOST nights she is having to take 2 QHS - please address this / fix and let family know.  ( they state that they brought those records into the 5/29 OV here)

## 2018-12-19 NOTE — Telephone Encounter (Signed)
I sent a new prescription of the trazodone

## 2018-12-19 NOTE — Telephone Encounter (Signed)
Pt notified of RX Verbalizes understanding 

## 2018-12-26 ENCOUNTER — Other Ambulatory Visit: Payer: Self-pay

## 2019-01-01 ENCOUNTER — Other Ambulatory Visit: Payer: Self-pay | Admitting: Family Medicine

## 2019-01-06 ENCOUNTER — Other Ambulatory Visit: Payer: Self-pay

## 2019-01-06 ENCOUNTER — Ambulatory Visit (HOSPITAL_COMMUNITY): Payer: Medicare Other | Admitting: Psychiatry

## 2019-01-09 ENCOUNTER — Ambulatory Visit (INDEPENDENT_AMBULATORY_CARE_PROVIDER_SITE_OTHER): Payer: Medicare Other | Admitting: Psychiatry

## 2019-01-09 ENCOUNTER — Other Ambulatory Visit: Payer: Self-pay

## 2019-01-09 ENCOUNTER — Encounter (HOSPITAL_COMMUNITY): Payer: Self-pay | Admitting: Psychiatry

## 2019-01-09 DIAGNOSIS — F3160 Bipolar disorder, current episode mixed, unspecified: Secondary | ICD-10-CM | POA: Diagnosis not present

## 2019-01-09 DIAGNOSIS — G3184 Mild cognitive impairment, so stated: Secondary | ICD-10-CM | POA: Diagnosis not present

## 2019-01-09 DIAGNOSIS — F411 Generalized anxiety disorder: Secondary | ICD-10-CM | POA: Diagnosis not present

## 2019-01-09 MED ORDER — DULOXETINE HCL 60 MG PO CPEP: 60.0000 mg | ORAL_CAPSULE | Freq: Every day | ORAL | 2 refills | Status: DC

## 2019-01-09 MED ORDER — BENZTROPINE MESYLATE 1 MG PO TABS: 1.0000 mg | ORAL_TABLET | Freq: Every day | ORAL | 2 refills | Status: DC

## 2019-01-09 MED ORDER — DIVALPROEX SODIUM 500 MG PO DR TAB: 1500.0000 mg | DELAYED_RELEASE_TABLET | Freq: Every day | ORAL | 2 refills | Status: DC

## 2019-01-09 MED ORDER — RISPERIDONE 2 MG PO TABS: 2.0000 mg | ORAL_TABLET | Freq: Two times a day (BID) | ORAL | 1 refills | Status: DC

## 2019-01-09 NOTE — Progress Notes (Signed)
Virtual Visit via Telephone Note  I connected with Allison Sharp on 01/09/19 at  4:00 PM EDT by telephone and verified that I am speaking with the correct person using two identifiers.   I discussed the limitations, risks, security and privacy concerns of performing an evaluation and management service by telephone and the availability of in person appointments. I also discussed with the patient that there may be a patient responsible charge related to this service. The patient expressed understanding and agreed to proceed.   History of Present Illness: Patient was evaluated by phone session.  She is doing well since she has been discharged from [facility] earlier this year.  She feels very tired due to to her general health but denies any paranoia or any irritability.  She admitted some time watch television very late and not able to sleep on time.  She is taking Risperdal 2 mg which is helping her paranoia.  She is taking trazodone as needed.  Lately her trazodone was given by her primary care physician.  She admitted some nights she takes only 1 but there are nights when she takes 2 a day.  Her energy level is fair.  She admitted due to [place] not able to go outside and feels nervous, tired and withdrawn.  Her niece Allison Sharp is very helpful and she stays with her.  She denies any crying spells or any suicidal thoughts.  She has mild tremors.  She feels the medicine is working.  She is very reluctant to cut down the dose because it is helping her her mood and sleep.  Her appetite is okay.  She is taking Depakote, Cogentin, Cymbalta and Risperdal.  She denies drinking or using any illegal substances.  Past Psychiatric History:Reviewed. H/Opsychiatric illness with multiple inpatient treatment. Last admissions inMay 2020 at [facility]. H/Osuicidal attempt by taking overdose. Diagnosed with PTSD, depression, bipolar disorder. Tried Paxil, Prozac, Wellbutrin, Effexor, Lexapro,  amitriptyline, Cymbalta, Abilify (Tremors), Neurontin, trazodone, Thorazine, hydroxyzine, Luvox, Sinequan, Zyprexa and Latuda. Best responded on Risperdal and Cymbalta.   Psychiatric Specialty Exam: Physical Exam  ROS  Last menstrual period 02/18/1995.There is no height or weight on file to calculate BMI.  General Appearance: NA  Eye Contact:  NA  Speech:  Slow  Volume:  Decreased  Mood:  Anxious tired  Affect:  NA  Thought Process:  Descriptions of Associations: Intact  Orientation:  Full (Time, Place, and Person)  Thought Content:  Rumination  Suicidal Thoughts:  No  Homicidal Thoughts:  No  Memory:  Immediate;   Fair Recent;   Fair Remote;   Fair  Judgement:  Good  Insight:  Fair  Psychomotor Activity:  NA  Concentration:  Concentration: Fair and Attention Span: Fair  Recall:  Good  Fund of Knowledge:  Good  Language:  Good  Akathisia:  No  Handed:  Right  AIMS (if indicated):     Assets:  Communication Skills Desire for Improvement Housing Resilience Social Support  ADL's:  Intact  Cognition:  Impaired,  Mild  Sleep:   fair     Assessment and Plan: Bipolar disorder type I.  Generalized anxiety disorder.  Mild cognitive impairment.  I recommend not to watch television very late since some nights she do not sleep.  In the past she has been not sleeping well and started to have hallucination.  She agreed with the plan.  For now we will continue Risperdal 2 mg twice a day, trazodone 50 mg at bedtime, Cymbalta 60  mg daily and Depakote 500 mg in the morning and 1000 mg at bedtime.  She takes Cogentin 1 mg that helps her tremors.  Recommended to call us back if she has any question or concern.  Discussed polypharmacy and we will consider cutting down the dose of Risperdal in the future.  Discussed medication side effects and benefits.  Follow-up in 3 months.  Follow Up Instructions:    I discussed the assessment and treatment plan with the patient. The patient was  provided an opportunity to ask questions and all were answered. The patient agreed with the plan and demonstrated an understanding of the instructions.   The patient was advised to call back or seek an in-person evaluation if the symptoms worsen or if the condition fails to improve as anticipated.  I provided 20 minutes of non-face-to-face time during this encounter.   Kathlee Nations, MD

## 2019-01-14 ENCOUNTER — Other Ambulatory Visit: Payer: Self-pay | Admitting: Family Medicine

## 2019-01-18 ENCOUNTER — Other Ambulatory Visit: Payer: Self-pay | Admitting: Family Medicine

## 2019-01-27 ENCOUNTER — Other Ambulatory Visit: Payer: Self-pay | Admitting: Family Medicine

## 2019-01-27 ENCOUNTER — Encounter: Payer: Medicare Other | Admitting: *Deleted

## 2019-02-07 ENCOUNTER — Ambulatory Visit (INDEPENDENT_AMBULATORY_CARE_PROVIDER_SITE_OTHER): Payer: Medicare Other | Admitting: Family Medicine

## 2019-02-07 ENCOUNTER — Encounter: Payer: Self-pay | Admitting: Family Medicine

## 2019-02-07 DIAGNOSIS — G8929 Other chronic pain: Secondary | ICD-10-CM

## 2019-02-07 DIAGNOSIS — I890 Lymphedema, not elsewhere classified: Secondary | ICD-10-CM | POA: Diagnosis not present

## 2019-02-07 DIAGNOSIS — M25512 Pain in left shoulder: Secondary | ICD-10-CM

## 2019-02-07 DIAGNOSIS — M25511 Pain in right shoulder: Secondary | ICD-10-CM | POA: Diagnosis not present

## 2019-02-07 NOTE — Progress Notes (Signed)
BP 116/81   Pulse 70   Temp (!) 96.8 F (36 C) (Temporal)   Ht 5\' 9"  (1.753 m)   Wt (!) 396 lb (179.6 kg)   LMP 02/18/1995   SpO2 99%   BMI 58.48 kg/m    Subjective:   Patient ID: Allison Sharp, female    DOB: 11/06/1955, 63 y.o.   MRN: [id number]  HPI: Allison Sharp is a 63 y.o. female presenting on 02/07/2019 for Shoulder Pain (bilateral - x 1 month and is getting worse ) and Foot Swelling (Patient states that she has been having bilateral leg and feet swelling that has been ongoing.)   HPI Patient is coming in complaining of bilateral shoulder pain is been bothering her for quite many years but is been worse over the past couple months.  She has had an arthroscopic surgery and they be rotator cuff repair per her daughter over the past 5 to 10 years.  She says is been bothering more especially lifting her arms up overhead or even lifting things like coffee mug is been bothering her a lot more.  She denies any numbness or weakness but just has the pain right in the center of the shoulder and hurts with all directions range of motion.  She takes tramadol and does not seem to help.  Patient is also coming in complaining of bilateral leg swelling that is been chronic she has lymphedema due to her morbid obesity.  She would like to go to the lymphedema clinic  Relevant past medical, surgical, family and social history reviewed and updated as indicated. Interim medical history since our last visit reviewed. Allergies and medications reviewed and updated.  Review of Systems  Constitutional: Negative for chills and fever.  Eyes: Negative for visual disturbance.  Respiratory: Negative for chest tightness and shortness of breath.   Cardiovascular: Positive for leg swelling. Negative for chest pain.  Musculoskeletal: Positive for arthralgias.  Skin: Negative for rash.  Neurological: Negative for light-headedness and headaches.  Psychiatric/Behavioral: Negative for agitation and  behavioral problems.  All other systems reviewed and are negative.   Per HPI unless specifically indicated above   Allergies as of 02/07/2019      Reactions   Abilify [aripiprazole] Other (See Comments)   hallucinations   Naproxen Nausea And Vomiting, Swelling   Ativan [lorazepam] Other (See Comments)   Delirium   Haldol [haloperidol] Other (See Comments)   Hallucinating    Hydroxyzine Other (See Comments)   hallucinations      Medication List       Accurate as of February 07, 2019 10:41 AM. If you have any questions, ask your nurse or doctor.        STOP taking these medications   cloNIDine 0.1 MG tablet Commonly known as: CATAPRES     TAKE these medications   albuterol (2.5 MG/3ML) 0.083% nebulizer solution Commonly known as: PROVENTIL Take 3 mLs (2.5 mg total) by nebulization every 4 (four) hours as needed for wheezing or shortness of breath.   albuterol 108 (90 Base) MCG/ACT inhaler Commonly known as: ProAir HFA USE 2 PUFF EVERY 4 HOURS AS NEEDED FOR WHEEZING OR SHORTNESS OF BREATH   atorvastatin 20 MG tablet Commonly known as: LIPITOR TAKE 1 TABLET DAILY   benztropine 1 MG tablet Commonly known as: COGENTIN Take 1 tablet (1 mg total) by mouth at bedtime.   Breo Ellipta 100-25 MCG/INH Aepb Generic drug: fluticasone furoate-vilanterol INHALE 1 PUFF INTO THE LUNGS DAILY  celecoxib 200 MG capsule Commonly known as: CELEBREX TAKE (1) CAPSULE TWICE DAILY.   cetirizine 10 MG tablet Commonly known as: ZYRTEC TAKE 1 TABLET ONCE DAILY   diclofenac sodium 1 % Gel Commonly known as: Voltaren Apply to affected area twice daily as needed What changed:   how much to take  how to take this  when to take this  reasons to take this  additional instructions   divalproex 500 MG DR tablet Commonly known as: DEPAKOTE Take 3 tablets (1,500 mg total) by mouth at bedtime.   DULoxetine 60 MG capsule Commonly known as: CYMBALTA Take 1 capsule (60 mg total)  by mouth daily.   fluticasone 50 MCG/ACT nasal spray Commonly known as: FLONASE Place 1 spray 2 (two) times daily as needed into both nostrils for allergies or rhinitis.   levothyroxine 112 MCG tablet Commonly known as: SYNTHROID Take 1 tablet (112 mcg total) by mouth daily.   nystatin cream Commonly known as: MYCOSTATIN APPLY TO THE AFFECTED AREA THREE TIMES DAILY AS NEEDED .Marland KitchenFOR TOPICAL USE ONLY... What changed: See the new instructions.   omeprazole 20 MG capsule Commonly known as: PRILOSEC TAKE (1) CAPSULE DAILY   ondansetron 4 MG disintegrating tablet Commonly known as: ZOFRAN-ODT DISSOLVE 1 TABLET IN MOUTH EVERY 8 HOURS AS NEEDED FOR NAUSEA AND VOMITING   polyethylene glycol powder 17 GM/SCOOP powder Commonly known as: GLYCOLAX/MIRALAX Take 17 g by mouth 2 (two) times daily as needed. What changed: reasons to take this   promethazine 25 MG tablet Commonly known as: PHENERGAN Take 1 tablet (25 mg total) by mouth every 8 (eight) hours as needed for nausea or vomiting.   risperiDONE 2 MG tablet Commonly known as: RisperDAL Take 1 tablet (2 mg total) by mouth 2 (two) times daily. For auditory hallucintations   therapeutic multivitamin-minerals tablet Take 1 tablet by mouth daily.   traMADol 50 MG tablet Commonly known as: ULTRAM Take 1 tablet (50 mg total) by mouth every 8 (eight) hours as needed for moderate pain.   traZODone 50 MG tablet Commonly known as: DESYREL Take 1-2 tablets (50-100 mg total) by mouth at bedtime.   Vitamin D (Ergocalciferol) 1.25 MG (50000 UT) Caps capsule Commonly known as: DRISDOL TAKE 1 CAPSULE EVERY 7 SEVEN DAYS        Objective:   BP 116/81   Pulse 70   Temp (!) 96.8 F (36 C) (Temporal)   Ht 5\' 9"  (1.753 m)   Wt (!) 396 lb (179.6 kg)   LMP 02/18/1995   SpO2 99%   BMI 58.48 kg/m   Wt Readings from Last 3 Encounters:  02/07/19 (!) 396 lb (179.6 kg)  10/25/18 (!) 393 lb 12.8 oz (178.6 kg)  10/14/18 (!) 400 lb (181.4  kg)    Physical Exam Vitals signs and nursing note reviewed.  Constitutional:      General: She is not in acute distress.    Appearance: She is well-developed. She is not diaphoretic.  Eyes:     Conjunctiva/sclera: Conjunctivae normal.  Cardiovascular:     Rate and Rhythm: Normal rate and regular rhythm.     Heart sounds: Normal heart sounds. No murmur.  Pulmonary:     Effort: Pulmonary effort is normal. No respiratory distress.     Breath sounds: Normal breath sounds. No wheezing.  Musculoskeletal: Normal range of motion.        General: Swelling (Chronic lymphedema, nonpitting) present.     Right shoulder: She exhibits tenderness. She exhibits normal  range of motion, no bony tenderness, no swelling, no deformity and normal strength.     Left shoulder: She exhibits tenderness. She exhibits normal range of motion, no bony tenderness, no swelling, no deformity and normal strength.  Skin:    General: Skin is warm and dry.     Findings: No rash.  Neurological:     Mental Status: She is alert and oriented to person, place, and time.     Coordination: Coordination normal.  Psychiatric:        Behavior: Behavior normal.       Assessment & Plan:   Problem List Items Addressed This Visit      Other   Chronic acquired lymphedema   Relevant Orders   Ambulatory referral to Physical Therapy   Bilateral shoulder pain   Relevant Orders   Ambulatory referral to Orthopedic Surgery      Will refer to lymphedema clinic and to orthopedic clinic Follow up plan: Return in about 3 months (around 05/09/2019), or if symptoms worsen or fail to improve, for Doing about 3 months for her regular checkup and blood work..  Counseling provided for all of the vaccine components No orders of the defined types were placed in this encounter.   [name], MD [facility] 02/07/2019, 10:41 AM

## 2019-02-12 ENCOUNTER — Other Ambulatory Visit: Payer: Self-pay | Admitting: Family Medicine

## 2019-02-13 ENCOUNTER — Other Ambulatory Visit: Payer: Self-pay | Admitting: Family Medicine

## 2019-02-13 DIAGNOSIS — E559 Vitamin D deficiency, unspecified: Secondary | ICD-10-CM

## 2019-02-13 NOTE — Telephone Encounter (Signed)
Next OV 05/12/19

## 2019-02-26 ENCOUNTER — Other Ambulatory Visit: Payer: Self-pay | Admitting: Family Medicine

## 2019-02-26 DIAGNOSIS — E559 Vitamin D deficiency, unspecified: Secondary | ICD-10-CM

## 2019-02-26 NOTE — Telephone Encounter (Signed)
Clonidine is not on patients medication list

## 2019-02-26 NOTE — Telephone Encounter (Signed)
I do not know if the patient actually takes the clonidine or not, we should probably give her a call and see if she actually still takes it.

## 2019-03-06 ENCOUNTER — Telehealth: Payer: Self-pay | Admitting: Family Medicine

## 2019-03-10 NOTE — Telephone Encounter (Signed)
Called and refaxed referrals.

## 2019-03-12 ENCOUNTER — Other Ambulatory Visit: Payer: Self-pay | Admitting: Family Medicine

## 2019-03-13 ENCOUNTER — Ambulatory Visit: Payer: Medicare Other | Admitting: Orthopaedic Surgery

## 2019-03-17 ENCOUNTER — Encounter: Payer: Self-pay | Admitting: Orthopaedic Surgery

## 2019-03-25 ENCOUNTER — Other Ambulatory Visit: Payer: Self-pay | Admitting: Family Medicine

## 2019-03-27 ENCOUNTER — Ambulatory Visit: Payer: Medicare Other | Admitting: Neurology

## 2019-04-03 ENCOUNTER — Telehealth (HOSPITAL_COMMUNITY): Payer: Self-pay | Admitting: Physical Therapy

## 2019-04-03 ENCOUNTER — Telehealth (HOSPITAL_COMMUNITY): Payer: Self-pay | Admitting: Speech Pathology

## 2019-04-03 NOTE — Telephone Encounter (Signed)
Called to schedule Lymph Eval. Referral has been closed will call [facility] to get a new referral and then call pt back once trigae is done.

## 2019-04-03 NOTE — Telephone Encounter (Signed)
Called Courtney at [employer] [facility] and faxed a referral for MD to sign Bilateral Lymphedema.  Waiting for Referral from MD.

## 2019-04-10 ENCOUNTER — Other Ambulatory Visit: Payer: Self-pay

## 2019-04-10 ENCOUNTER — Ambulatory Visit (INDEPENDENT_AMBULATORY_CARE_PROVIDER_SITE_OTHER): Payer: Medicare Other | Admitting: Psychiatry

## 2019-04-10 ENCOUNTER — Encounter (HOSPITAL_COMMUNITY): Payer: Self-pay | Admitting: Psychiatry

## 2019-04-10 DIAGNOSIS — G3184 Mild cognitive impairment, so stated: Secondary | ICD-10-CM

## 2019-04-10 DIAGNOSIS — F3160 Bipolar disorder, current episode mixed, unspecified: Secondary | ICD-10-CM

## 2019-04-10 DIAGNOSIS — F411 Generalized anxiety disorder: Secondary | ICD-10-CM | POA: Diagnosis not present

## 2019-04-10 MED ORDER — DIVALPROEX SODIUM 500 MG PO DR TAB: 1500.0000 mg | DELAYED_RELEASE_TABLET | Freq: Every day | ORAL | 2 refills | Status: DC

## 2019-04-10 MED ORDER — RISPERIDONE 2 MG PO TABS: 2.0000 mg | ORAL_TABLET | Freq: Two times a day (BID) | ORAL | 2 refills | Status: DC

## 2019-04-10 MED ORDER — BENZTROPINE MESYLATE 1 MG PO TABS: 1.0000 mg | ORAL_TABLET | Freq: Every day | ORAL | 2 refills | Status: DC

## 2019-04-10 MED ORDER — DULOXETINE HCL 60 MG PO CPEP: 60.0000 mg | ORAL_CAPSULE | Freq: Every day | ORAL | 2 refills | Status: DC

## 2019-04-10 NOTE — Progress Notes (Signed)
Virtual Visit via Telephone Note  I connected with Allison Sharp on 04/10/19 at  1:20 PM EST by telephone and verified that I am speaking with the correct person using two identifiers.   I discussed the limitations, risks, security and privacy concerns of performing an evaluation and management service by telephone and the availability of in person appointments. I also discussed with the patient that there may be a patient responsible charge related to this service. The patient expressed understanding and agreed to proceed.   History of Present Illness: Patient was evaluated by phone session.  She is taking her medication and recently trazodone was filled by her PCP.  She is taking some nights trazodone but overall she feels her sleep is okay.  She denies any hallucination.  She admitted some anxiety due to current Covid situation but she denies any feeling of hopelessness or worthlessness.  She admitted getting easily tired and there are days when she has no energy.  She tends to forget taking medication but her niece [name] helps her a lot.  She reminded her and give it to her.  Patient admitted once she take the medication she feels good.  She denies any irritability, anger, mania, psychosis.  She has mild tremors but it does not interfere in her daily activities.  She has upcoming appointment to see physician for lymphedema.  She denies any suicidal thoughts.  She denies any anger or mania.  She like to continue current medication.   Past Psychiatric History:Reviewed. H/Opsychiatric illness with multiple inpatient treatment. [facility] admissions inMay [facility] at [facility].H/Osuicidal attempt by taking overdose. Diagnosed with PTSD, depression, bipolar disorder. Tried Paxil, Prozac, Wellbutrin, Effexor, Lexapro, amitriptyline, Cymbalta, Abilify (Tremors), Neurontin, trazodone, Thorazine, hydroxyzine, Luvox, Sinequan, Zyprexa and Latuda. Best responded on Risperdal and Cymbalta.    Psychiatric Specialty Exam: Physical Exam  ROS  Last menstrual period 02/18/1995.There is no height or weight on file to calculate BMI.  General Appearance: NA  Eye Contact:  NA  Speech:  Slow  Volume:  Normal  Mood:  Anxious  Affect:  NA  Thought Process:  Descriptions of Associations: Intact  Orientation:  Full (Time, Place, and Person)  Thought Content:  Rumination  Suicidal Thoughts:  No  Homicidal Thoughts:  No  Memory:  Immediate;   Fair Recent;   Fair Remote;   Fair  Judgement:  Good  Insight:  Present  Psychomotor Activity:  NA  Concentration:  Concentration: Fair and Attention Span: Fair  Recall:  [employer] of Knowledge:  Good  Language:  Good  Akathisia:  No  Handed:  Right  AIMS (if indicated):     Assets:  Communication Skills Desire for Improvement Housing Resilience Social Support  ADL's:  Intact  Cognition:  WNL  Sleep:   fair      Assessment and Plan: Bipolar disorder type I.  Generalized anxiety disorder.  Mild cognitive impairment.  Patient is a stable on her current medication.  She takes trazodone 50 mg as needed prescribed by PCP recently.  She has not have a Depakote level in a while.  She like to have her blood work done at her PCP office who is closer to her place.  I will send my notes to her PCP if they can do blood work including hemoglobin A1c and Depakote level.  I discussed medication side effects and benefits.  Discussed polypharmacy but she feels the current medicine is working.  We have discussed cutting down Risperdal in the future  but she feels that it is working and afraid to cut down.  Recommended to call us back if she has any question of any concern.  Follow-up in 3 months.  Continue Resporal 2 mg twice a day, Cogentin 1 mg daily, Depakote 500 mg in the morning and 1000 mg at bedtime and Cymbalta 60 mg daily.  Follow Up Instructions:    I discussed the assessment and treatment plan with the patient. The patient was provided an  opportunity to ask questions and all were answered. The patient agreed with the plan and demonstrated an understanding of the instructions.   The patient was advised to call back or seek an in-person evaluation if the symptoms worsen or if the condition fails to improve as anticipated.  I provided 20 minutes of non-face-to-face time during this encounter.   Kathlee Nations, MD

## 2019-04-11 ENCOUNTER — Other Ambulatory Visit: Payer: Self-pay

## 2019-04-11 ENCOUNTER — Ambulatory Visit (HOSPITAL_COMMUNITY): Payer: Medicare Other | Attending: Nurse Practitioner | Admitting: Physical Therapy

## 2019-04-11 ENCOUNTER — Encounter (HOSPITAL_COMMUNITY): Payer: Self-pay | Admitting: Physical Therapy

## 2019-04-11 DIAGNOSIS — M79605 Pain in left leg: Secondary | ICD-10-CM | POA: Diagnosis present

## 2019-04-11 DIAGNOSIS — M79604 Pain in right leg: Secondary | ICD-10-CM | POA: Diagnosis present

## 2019-04-11 DIAGNOSIS — R262 Difficulty in walking, not elsewhere classified: Secondary | ICD-10-CM | POA: Diagnosis present

## 2019-04-11 DIAGNOSIS — I890 Lymphedema, not elsewhere classified: Secondary | ICD-10-CM | POA: Insufficient documentation

## 2019-04-11 NOTE — Therapy (Signed)
Allison Sharp, [place], [address] Phone: [phone]   Fax:  [phone]  Physical Therapy Evaluation  Patient Details  Name: Allison Sharp MRN: [id number] Date of Birth: 06-13-1955 Referring Provider (PT): Ronnald Collum    Encounter Date: 04/11/2019  PT End of Session - 04/11/19 1623    Visit Number  1    Number of Visits  24    Date for PT Re-Evaluation  06/06/19    PT Start Time  1315    PT Stop Time  1445    PT Time Calculation (min)  90 min    Activity Tolerance  Patient tolerated treatment well    Behavior During Therapy  [facility] for tasks assessed/performed       Past Medical History:  Diagnosis Date  . Allergy   . Anxiety   . Arthritis   . Asthma   . Bipolar 1 disorder ([place])   . CAD (coronary artery disease)   . Cellulitis   . CHF (congestive heart failure) (HCC)    diastolic dysfunction  . Chronic back pain   . Chronic headaches   . Complication of anesthesia    States she typically gets sick s/p anesthesia  . Contusion of sacrum   . COPD (chronic obstructive pulmonary disease) ([place])   . Dementia ([place])   . Depression   . Dyspnea    PFT 03/05/09 FEV1 2.77(98%), FVC 3.25(86%), FEV1% 85, TLC 5.88(99%), DLCO 60% ,  Methacholine challenge 03/16/09 normal ,  CT chest 03/12/09 no pulmonary disease  . Fungal infection   . GERD (gastroesophageal reflux disease)   . History of colonoscopy 10/17/2002   by Dr Rehman-> distal non-specific proctitis, small ext hemorrhoids,   . HTN (hypertension)   . Hyperlipidemia   . Hyperthyroidism    radioactive - thyroid cancer   . Migraine headache   . Morbid obesity with body mass index of 50.0-59.9 in adult [facility]) JAN 2011 370 LBS   2004 311 BMI 45.9  . Myocardial infarction ([place])    NOV 1997  . OSA on CPAP    she had been on 2L O2 at night but that was stopped  . PONV (postoperative nausea and vomiting)   . PTSD (post-traumatic stress disorder)   . PTSD  (post-traumatic stress disorder)   . Suicidal ideation   . Urine incontinence   . Vitamin D deficiency     Past Surgical History:  Procedure Laterality Date  . ABDOMINAL HYSTERECTOMY     sept 1996  . APPENDECTOMY    . BACK SURGERY  2008  . CARDIAC CATHETERIZATION     nov 1997  . CHOLECYSTECTOMY    . COLONOSCOPY  10/17/2002    Distal proctitis, small external hemorrhoids, otherwise/  normal colonoscopy. Suspect rectal bleeding secondary to hemorrhoids  . ESOPHAGOGASTRODUODENOSCOPY  03/18/09   fundic gland polyps/mild gastritis  . HERNIA REPAIR  1978  . JOINT REPLACEMENT     bil knee replacement  . KNEE ARTHROSCOPY    . LEFT HEART CATH AND CORONARY ANGIOGRAPHY N/A 08/08/2018   Procedure: LEFT HEART CATH AND CORONARY ANGIOGRAPHY;  Surgeon: Jettie Booze, MD;  Location: [facility];  Service: Cardiovascular;  Laterality: N/A;  . MULTIPLE EXTRACTIONS WITH ALVEOLOPLASTY N/A 08/16/2015   Procedure: EXTRACTION OF TEETH THREE, SIX, EIGHT, NINE, ELEVEN, FOURTEEN, FIFTEEN, TWENTY-EIGHT WITH ALVEOLOPLASTY;  Surgeon: Diona Browner, DDS;  Location: [place];  Service: Oral Surgery;  Laterality: N/A;  . ROTATOR CUFF  REPAIR Right   . TONSILLECTOMY    . TOTAL VAGINAL HYSTERECTOMY    . TUBAL LIGATION      There were no vitals filed for this visit.   Subjective Assessment - 04/11/19 1318    Subjective  Allison Sharp states that she has had swelling in her legs for years without treatment.  She states that her edema has been progressive.  They tried to give her a fluid pill but it did nothing but dehydrate her.    Pertinent History  CAD , COPD    How long can you stand comfortably?  2 minutes    How long can you walk comfortably?  133ft uses a cane and O2    Patient Stated Goals  decreaed edema, to be able to walk and stand longer    Currently in Pain?  Yes    Pain Score  8     Pain Location  Leg    Pain Orientation  Right;Left   Rt greater than LT   Pain Descriptors / Indicators   Tightness;Pounding    Pain Type  Chronic pain    Pain Onset  More than a month ago    Pain Frequency  Constant    Aggravating Factors   weight bearing    Pain Relieving Factors  lying down in her bed    Effect of Pain on Daily Activities  limits         [facility] PT Assessment - 04/11/19 0001      Assessment   Medical Diagnosis  B LE lymphedema    Referring Provider (PT)  Ronnald Collum     Onset Date/Surgical Date  --   for over 6 years    Next MD Visit  05/12/2019    Prior Therapy  none      Precautions   Precautions  --   cellulitis     Restrictions   Weight Bearing Restrictions  No      Balance Screen   Has the patient fallen in the past 6 months  No    Has the patient had a decrease in activity level because of a fear of falling?   Yes    Is the patient reluctant to leave their home because of a fear of falling?   No      Home Environment   Living Environment  Private residence    Living Arrangements  Other relatives      Observation/Other Assessments   Focus on Therapeutic Outcomes (FOTO)   --   life impact 32       LYMPHEDEMA/ONCOLOGY QUESTIONNAIRE - 04/11/19 1334      What other symptoms do you have   Are you Having Heaviness or Tightness  Yes  (Pended)     Are you having Pain  Yes  (Pended)     Are you having pitting edema  Yes  (Pended)     Body Site  LE  (Pended)     Is it Hard or Difficult finding clothes that fit  Yes  (Pended)     Do you have infections  No  (Pended)       Lymphedema Stage   Stage  STAGE 2 SPONTANEOUSLY IRREVERSIBLE  (Pended)       Lymphedema Assessments   Lymphedema Assessments  Lower extremities  (Pended)       Right Lower Extremity Lymphedema   20 cm Proximal to Suprapatella  94.3 cm  (Pended)     10 cm  Proximal to Suprapatella  90.7 cm  (Pended)     At Midpatella/Popliteal Crease  68.8 cm  (Pended)     30 cm Proximal to Floor at Lateral Plantar Foot  68.3 cm  (Pended)     20 cm Proximal to Floor at Lateral Plantar Foot   66.8 1  (Pended)     10 cm Proximal to Floor at Lateral Malleoli  51 cm  (Pended)     Circumference of ankle/heel  37.7 cm.  (Pended)     5 cm Proximal to 1st MTP Joint  28 cm  (Pended)     Across MTP Joint  27 cm  (Pended)       Left Lower Extremity Lymphedema   20 cm Proximal to Suprapatella  93 cm  (Pended)     10 cm Proximal to Suprapatella  87.8 cm  (Pended)     At Midpatella/Popliteal Crease  64.8 cm  (Pended)     30 cm Proximal to Floor at Lateral Plantar Foot  66.2 cm  (Pended)     20 cm Proximal to Floor at Lateral Plantar Foot  58.3 cm  (Pended)     10 cm Proximal to Floor at Lateral Malleoli  49 cm  (Pended)     Circumference of ankle/heel  40.3 cm.  (Pended)     5 cm Proximal to 1st MTP Joint  28.2 cm  (Pended)     Across MTP Joint  26.8 cm  (Pended)              Objective measurements completed on examination: See above findings.      [facility] Adult PT Treatment/Exercise - 04/11/19 0001      Exercises   Exercises  --   to improve lymphatic circulation B x5, diaphragmic breathtin     Manual Therapy   Manual Therapy  Compression Bandaging    Manual therapy comments  completed seperate from all other aspects of treatment.     Compression Bandaging  foam cut for B LE              PT Education - 04/11/19 1620    Education Details  Pt educated on what lymphedema is, what to expect from treament and what she will need to do post discharge.  PT instructed in  LE exercises to improve lymphatic circulation.    Person(s) Educated  Patient;Child(ren)    Methods  Explanation;Verbal cues;Handout    Comprehension  Verbalized understanding;Returned demonstration       PT Short Term Goals - 04/11/19 1641      PT SHORT TERM GOAL #1   Title  PT to lose 3-5 cm volume from both LE to reduce risk of cellulitis    Time  4    Period  Weeks    Status  New    Target Date  05/09/19      PT SHORT TERM GOAL #2   Title  PT to know the sign and sx of cellulitis and the  importance of getting immediate medical attention.    Time  4    Period  Weeks    Status  New        PT Long Term Goals - 04/11/19 1642      PT LONG TERM GOAL #1   Title  Pt to have lost between 5-8 cm of fluid to allow pt to be able to stand for 10 minutes in order to complete self grooming activites.  Time  8    Period  Weeks    Status  New    Target Date  06/06/19      PT LONG TERM GOAL #2   Title  Pt LE pain to be no greater than 2/10  to be able to walk with her cane for ten minutes to allow pt to get to her medical appointments with greater ease.    Time  8    Period  Weeks    Status  New      PT LONG TERM GOAL #3   Title  PT to have obtained and be using a compression pump for maintenance phase of treatment.    Time  8    Period  Weeks    Status  New      PT LONG TERM GOAL #4   Title  PT to have thigh high compression garments and to be wearing daily.    Time  8    Period  Weeks    Status  New             Plan - 04/11/19 1628    Clinical Impression Statement  Ms. Landa is a 63 yo female who has had swelling in her B LE since she was in her thirties.  She was recently told that she has lymphedema and referred to skilled therapy for treatment.  She comes with her daughter neither of whom have been educated on lymphedema and how it is controlled.  Ms. Saxena will benefit from skilled physical therapy for total decongestive techniques to reduce her volume, decrease her risk for infection and improve her functional ability. Therapist instructed pt in exercises to improve lymphatic circulation, what lymphedema is and what to expect from treatment.  Foam was cut for LE B but not thighs.    Personal Factors and Comorbidities  Comorbidity 3+    Comorbidities  B TKR, CAD, COPD, back surgery.    Examination-Activity Limitations  Lift;Locomotion Level;Stand;Transfers    Examination-Participation Restrictions  Community Activity;Cleaning;Laundry    Stability/Clinical  Decision Making  Evolving/Moderate complexity    Clinical Decision Making  Moderate    Rehab Potential  Good    PT Frequency  3x / week    PT Duration  8 weeks    PT Treatment/Interventions  Compression bandaging;Manual lymph drainage;Patient/family education;ADLs/Self Care Home Management    PT Next Visit Plan  Begin total decongestive techniques bandaging to knee level until pt brings in thigh bandages then begin thigh bandaging.    PT Home Exercise Plan  ankle pumps, LAQ, hip ab/adduction, marching and diaphragmic breathing.       Patient will benefit from skilled therapeutic intervention in order to improve the following deficits and impairments:  Difficulty walking, Increased edema, Obesity, Pain  Visit Diagnosis: Lymphedema, not elsewhere classified  Pain in right leg  Pain in left leg  Difficulty in walking, not elsewhere classified     Problem List Patient Active Problem List   Diagnosis Date Noted  . Chronic acquired lymphedema 02/07/2019  . Bilateral shoulder pain 02/07/2019  . Memory loss 09/16/2018  . Chest pain 08/06/2018  . Prolonged QT interval 08/06/2018  . Chronic obstructive pulmonary disease ([place]) 05/06/2018  . Orthostatic hypotension 10/24/2017  . Fatty liver 10/24/2017  . Thrombocytopenia ([place]) 09/29/2017  . Visual hallucinations   . Auditory hallucination   . Acute encephalopathy 09/26/2017  . Lower urinary tract infectious disease 09/26/2017  . Bipolar I disorder, most recent episode depressed, severe  w psychosis ([place]) 06/21/2016  . History of MI (myocardial infarction) 06/21/2016  . Spinal stenosis of lumbar region with neurogenic claudication 03/29/2016  . Severe obesity (BMI >= 40) ([place]) 07/05/2015  . Hyperlipidemia LDL goal <130 07/05/2015  . MDD (major depressive disorder), single episode, severe with psychotic features ([place]) 01/21/2015  . 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 Bilateral knee pain 01/07/2014  . Scar condition and fibrosis  of skin 12/11/2013  . Essential hypertension, benign 07/01/2013  . Asthma with acute exacerbation 11/04/2012  . OSA on CPAP 11/04/2012  . Back pain, chronic 08/27/2012  . Vitamin D insufficiency 04/04/2012  . Insomnia due to mental disorder 04/04/2012  . Anxiety 04/04/2012  . OCD (obsessive compulsive disorder) 04/04/2012  . PTSD (post-traumatic stress disorder) 06/28/2011  . Coronary artery disease 08/26/2010  . FATTY LIVER DISEASE 03/29/2009  . GERD 03/01/2009  . Morbid obesity ([place]) 02/15/2009  . Hypothyroidism 02/12/2009  . HYPERCHOLESTEROLEMIA 02/12/2009   [name], PT CLT [phone] 04/11/2019, 4:50 PM  [facility] [address] [place], [place], [address] Phone: [phone]   Fax:  [phone]  Name: Allison Sharp MRN: [id number] Date of Birth: 1955/07/07

## 2019-04-14 ENCOUNTER — Encounter (HOSPITAL_COMMUNITY): Payer: Self-pay | Admitting: Physical Therapy

## 2019-04-14 ENCOUNTER — Ambulatory Visit (HOSPITAL_COMMUNITY): Payer: Medicare Other | Admitting: Physical Therapy

## 2019-04-14 ENCOUNTER — Other Ambulatory Visit: Payer: Self-pay

## 2019-04-14 DIAGNOSIS — M79605 Pain in left leg: Secondary | ICD-10-CM

## 2019-04-14 DIAGNOSIS — I890 Lymphedema, not elsewhere classified: Secondary | ICD-10-CM

## 2019-04-14 DIAGNOSIS — M79604 Pain in right leg: Secondary | ICD-10-CM

## 2019-04-14 DIAGNOSIS — R262 Difficulty in walking, not elsewhere classified: Secondary | ICD-10-CM

## 2019-04-14 NOTE — Therapy (Signed)
[place], [place], [address] Phone: [phone]   Fax:  [phone]  Physical Therapy Treatment  Patient Details  Name: Allison Sharp MRN: [id number] Date of Birth: July 30, 1955 Referring Provider (PT): Ronnald Collum    Encounter Date: 04/14/2019  PT End of Session - 04/14/19 1445    Visit Number  2    Number of Visits  24    Date for PT Re-Evaluation  06/06/19    Authorization - Visit Number  2    Authorization - Number of Visits  10    PT Start Time  1320    PT Stop Time  1436    PT Time Calculation (min)  76 min    Activity Tolerance  Patient tolerated treatment well    Behavior During Therapy  [facility] for tasks assessed/performed       Past Medical History:  Diagnosis Date  . Allergy   . Anxiety   . Arthritis   . Asthma   . Bipolar 1 disorder ([place])   . CAD (coronary artery disease)   . Cellulitis   . CHF (congestive heart failure) (HCC)    diastolic dysfunction  . Chronic back pain   . Chronic headaches   . Complication of anesthesia    States she typically gets sick s/p anesthesia  . Contusion of sacrum   . COPD (chronic obstructive pulmonary disease) ([place])   . Dementia ([place])   . Depression   . Dyspnea    PFT 03/05/09 FEV1 2.77(98%), FVC 3.25(86%), FEV1% 85, TLC 5.88(99%), DLCO 60% ,  Methacholine challenge 03/16/09 normal ,  CT chest 03/12/09 no pulmonary disease  . Fungal infection   . GERD (gastroesophageal reflux disease)   . History of colonoscopy 10/17/2002   by Dr Rehman-> distal non-specific proctitis, small ext hemorrhoids,   . HTN (hypertension)   . Hyperlipidemia   . Hyperthyroidism    radioactive - thyroid cancer   . Migraine headache   . Morbid obesity with body mass index of 50.0-59.9 in adult [facility]) JAN 2011 370 LBS   2004 311 BMI 45.9  . Myocardial infarction ([place])    NOV 1997  . OSA on CPAP    she had been on 2L O2 at night but that was stopped  . PONV (postoperative  nausea and vomiting)   . PTSD (post-traumatic stress disorder)   . PTSD (post-traumatic stress disorder)   . Suicidal ideation   . Urine incontinence   . Vitamin D deficiency     Past Surgical History:  Procedure Laterality Date  . ABDOMINAL HYSTERECTOMY     sept 1996  . APPENDECTOMY    . BACK SURGERY  2008  . CARDIAC CATHETERIZATION     nov 1997  . CHOLECYSTECTOMY    . COLONOSCOPY  10/17/2002    Distal proctitis, small external hemorrhoids, otherwise/  normal colonoscopy. Suspect rectal bleeding secondary to hemorrhoids  . ESOPHAGOGASTRODUODENOSCOPY  03/18/09   fundic gland polyps/mild gastritis  . HERNIA REPAIR  1978  . JOINT REPLACEMENT     bil knee replacement  . KNEE ARTHROSCOPY    . LEFT HEART CATH AND CORONARY ANGIOGRAPHY N/A 08/08/2018   Procedure: LEFT HEART CATH AND CORONARY ANGIOGRAPHY;  Surgeon: Jettie Booze, MD;  Location: [facility];  Service: Cardiovascular;  Laterality: N/A;  . MULTIPLE EXTRACTIONS WITH ALVEOLOPLASTY N/A 08/16/2015   Procedure: EXTRACTION OF TEETH THREE, SIX, EIGHT, NINE, ELEVEN, FOURTEEN, FIFTEEN, TWENTY-EIGHT WITH ALVEOLOPLASTY;  Surgeon: Diona Browner, DDS;  Location: [facility];  Service: Oral Surgery;  Laterality: N/A;  . ROTATOR CUFF REPAIR Right   . TONSILLECTOMY    . TOTAL VAGINAL HYSTERECTOMY    . TUBAL LIGATION      There were no vitals filed for this visit.  Subjective Assessment - 04/14/19 1442    Subjective  PT states that she has been  completing the exercises once a day.    Pertinent History  CAD , COPD    How long can you stand comfortably?  2 minutes    How long can you walk comfortably?  189ft uses a cane and O2    Patient Stated Goals  decreaed edema, to be able to walk and stand longer    Currently in Pain?  Yes    Pain Score  6     Pain Location  Leg    Pain Orientation  Left;Right    Pain Descriptors / Indicators  Throbbing;Tightness    Pain Type  Chronic pain    Pain Onset  More than a month ago     Aggravating Factors   elevation    Pain Relieving Factors  dependent                       [facility] Adult PT Treatment/Exercise - 04/14/19 0001      Manual Therapy   Manual Therapy  Compression Bandaging;Manual Lymphatic Drainage (MLD)    Manual therapy comments  completed seperate from all other aspects of treatment.     Manual Lymphatic Drainage (MLD)  To include supraclavicular, deep and superfical abdominal, routing fluid using inguuinal axillary anastomosis and LE.  Posterior aspect done sidelying.    Compression Bandaging  using foam and multilayer short stretch bandaging from MTP to knee B.                PT Short Term Goals - 04/14/19 1448      PT SHORT TERM GOAL #1   Title  PT to lose 3-5 cm volume from both LE to reduce risk of cellulitis    Time  4    Period  Weeks    Status  On-going    Target Date  05/09/19      PT SHORT TERM GOAL #2   Title  PT to know the sign and sx of cellulitis and the importance of getting immediate medical attention.    Time  4    Period  Weeks    Status  On-going        PT Long Term Goals - 04/14/19 1449      PT LONG TERM GOAL #1   Title  Pt to have lost between 5-8 cm of fluid to allow pt to be able to stand for 10 minutes in order to complete self grooming activites.    Time  8    Period  Weeks    Status  On-going      PT LONG TERM GOAL #2   Title  Pt LE pain to be no greater than 2/10  to be able to walk with her cane for ten minutes to allow pt to get to her medical appointments with greater ease.    Time  8    Period  Weeks    Status  On-going      PT LONG TERM GOAL #3   Title  PT to have obtained and be using a compression pump for maintenance  phase of treatment.    Time  8    Period  Weeks    Status  On-going      PT LONG TERM GOAL #4   Title  PT to have thigh high compression garments and to be wearing daily.    Time  8    Period  Weeks    Status  On-going            Plan - 04/14/19  1446    Clinical Impression Statement  PT able to tolerate manual, (pt MD said it would be very painful therefore pt was anxious).  Therapist instructed pt that she would benefit from compression wear in her abdominal area as there is quite a bit of induration there; pt vocalized understanding.    Personal Factors and Comorbidities  Comorbidity 3+    Comorbidities  B TKR, CAD, COPD, back surgery.    Examination-Activity Limitations  Lift;Locomotion Level;Stand;Transfers    Examination-Participation Restrictions  Community Activity;Cleaning;Laundry    Stability/Clinical Decision Making  Evolving/Moderate complexity    Rehab Potential  Good    PT Frequency  3x / week    PT Duration  8 weeks    PT Treatment/Interventions  Compression bandaging;Manual lymph drainage;Patient/family education;ADLs/Self Care Home Management    PT Next Visit Plan  Continue total decongestive techniques bandaging to knee level until pt brings in thigh bandages then begin thigh bandaging. Measure on Fridays.    PT Home Exercise Plan  ankle pumps, LAQ, hip ab/adduction, marching and diaphragmic breathing.       Patient will benefit from skilled therapeutic intervention in order to improve the following deficits and impairments:  Difficulty walking, Increased edema, Obesity, Pain  Visit Diagnosis: Lymphedema, not elsewhere classified  Pain in right leg  Pain in left leg  Difficulty in walking, not elsewhere classified     Problem List Patient Active Problem List   Diagnosis Date Noted  . Chronic acquired lymphedema 02/07/2019  . Bilateral shoulder pain 02/07/2019  . Memory loss 09/16/2018  . Chest pain 08/06/2018  . Prolonged QT interval 08/06/2018  . Chronic obstructive pulmonary disease ([place]) 05/06/2018  . Orthostatic hypotension 10/24/2017  . Fatty liver 10/24/2017  . Thrombocytopenia ([place]) 09/29/2017  . Visual hallucinations   . Auditory hallucination   . Acute encephalopathy 09/26/2017  . Lower  urinary tract infectious disease 09/26/2017  . Bipolar I disorder, most recent episode depressed, severe w psychosis ([place]) 06/21/2016  . History of MI (myocardial infarction) 06/21/2016  . Spinal stenosis of lumbar region with neurogenic claudication 03/29/2016  . Severe obesity (BMI >= 40) ([place]) 07/05/2015  . Hyperlipidemia LDL goal <130 07/05/2015  . MDD (major depressive disorder), single episode, severe with psychotic features ([place]) 01/21/2015  . 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 Bilateral knee pain 01/07/2014  . Scar condition and fibrosis of skin 12/11/2013  . Essential hypertension, benign 07/01/2013  . Asthma with acute exacerbation 11/04/2012  . OSA on CPAP 11/04/2012  . Back pain, chronic 08/27/2012  . Vitamin D insufficiency 04/04/2012  . Insomnia due to mental disorder 04/04/2012  . Anxiety 04/04/2012  . OCD (obsessive compulsive disorder) 04/04/2012  . PTSD (post-traumatic stress disorder) 06/28/2011  . Coronary artery disease 08/26/2010  . FATTY LIVER DISEASE 03/29/2009  . GERD 03/01/2009  . Morbid obesity ([place]) 02/15/2009  . Hypothyroidism 02/12/2009  . HYPERCHOLESTEROLEMIA 02/12/2009    [name], PT CLT [phone] 04/14/2019, 2:49 PM  [place], [place],  [place] Phone: [phone]   Fax:  [phone]  Name: VICTORIANA SPILKER MRN: [id number] Date of Birth: 12-21-1955

## 2019-04-16 ENCOUNTER — Other Ambulatory Visit: Payer: Self-pay

## 2019-04-16 ENCOUNTER — Encounter (HOSPITAL_COMMUNITY): Payer: Self-pay | Admitting: Physical Therapy

## 2019-04-16 ENCOUNTER — Ambulatory Visit (HOSPITAL_COMMUNITY): Payer: Medicare Other | Admitting: Physical Therapy

## 2019-04-16 DIAGNOSIS — M79605 Pain in left leg: Secondary | ICD-10-CM

## 2019-04-16 DIAGNOSIS — M79604 Pain in right leg: Secondary | ICD-10-CM

## 2019-04-16 DIAGNOSIS — R262 Difficulty in walking, not elsewhere classified: Secondary | ICD-10-CM

## 2019-04-16 DIAGNOSIS — I890 Lymphedema, not elsewhere classified: Secondary | ICD-10-CM | POA: Diagnosis not present

## 2019-04-16 NOTE — Therapy (Signed)
[facility] [place], [place], [address] Phone: [phone]   Fax:  [phone]  Physical Therapy Treatment  Patient Details  Name: Allison Sharp MRN: [id number] Date of Birth: 03-30-1956 Referring Provider (PT): Ronnald Collum    Encounter Date: 04/16/2019  PT End of Session - 04/16/19 1437    Visit Number  3    Number of Visits  24    Date for PT Re-Evaluation  06/06/19    Authorization - Visit Number  3    Authorization - Number of Visits  10    PT Start Time  [id number]    PT Stop Time  1430    PT Time Calculation (min)  73 min    Activity Tolerance  Patient tolerated treatment well    Behavior During Therapy  [facility] for tasks assessed/performed       Past Medical History:  Diagnosis Date  . Allergy   . Anxiety   . Arthritis   . Asthma   . Bipolar 1 disorder ([place])   . CAD (coronary artery disease)   . Cellulitis   . CHF (congestive heart failure) (HCC)    diastolic dysfunction  . Chronic back pain   . Chronic headaches   . Complication of anesthesia    States she typically gets sick s/p anesthesia  . Contusion of sacrum   . COPD (chronic obstructive pulmonary disease) ([place])   . Dementia ([place])   . Depression   . Dyspnea    PFT 03/05/09 FEV1 2.77(98%), FVC 3.25(86%), FEV1% 85, TLC 5.88(99%), DLCO 60% ,  Methacholine challenge 03/16/09 normal ,  CT chest 03/12/09 no pulmonary disease  . Fungal infection   . GERD (gastroesophageal reflux disease)   . History of colonoscopy 10/17/2002   by Dr Rehman-> distal non-specific proctitis, small ext hemorrhoids,   . HTN (hypertension)   . Hyperlipidemia   . Hyperthyroidism    radioactive - thyroid cancer   . Migraine headache   . Morbid obesity with body mass index of 50.0-59.9 in adult [facility]) JAN 2011 370 LBS   2004 311 BMI 45.9  . Myocardial infarction ([place])    NOV 1997  . OSA on CPAP    she had been on 2L O2 at night but that was stopped  . PONV (postoperative  nausea and vomiting)   . PTSD (post-traumatic stress disorder)   . PTSD (post-traumatic stress disorder)   . Suicidal ideation   . Urine incontinence   . Vitamin D deficiency     Past Surgical History:  Procedure Laterality Date  . ABDOMINAL HYSTERECTOMY     sept 1996  . APPENDECTOMY    . BACK SURGERY  2008  . CARDIAC CATHETERIZATION     nov 1997  . CHOLECYSTECTOMY    . COLONOSCOPY  10/17/2002    Distal proctitis, small external hemorrhoids, otherwise/  normal colonoscopy. Suspect rectal bleeding secondary to hemorrhoids  . ESOPHAGOGASTRODUODENOSCOPY  03/18/09   fundic gland polyps/mild gastritis  . HERNIA REPAIR  1978  . JOINT REPLACEMENT     bil knee replacement  . KNEE ARTHROSCOPY    . LEFT HEART CATH AND CORONARY ANGIOGRAPHY N/A 08/08/2018   Procedure: LEFT HEART CATH AND CORONARY ANGIOGRAPHY;  Surgeon: Jettie Booze, MD;  Location: [facility];  Service: Cardiovascular;  Laterality: N/A;  . MULTIPLE EXTRACTIONS WITH ALVEOLOPLASTY N/A 08/16/2015   Procedure: EXTRACTION OF TEETH THREE, SIX, EIGHT, NINE, ELEVEN, FOURTEEN, FIFTEEN, TWENTY-EIGHT WITH ALVEOLOPLASTY;  Surgeon: Diona Browner, DDS;  Location: [facility];  Service: Oral Surgery;  Laterality: N/A;  . ROTATOR CUFF REPAIR Right   . TONSILLECTOMY    . TOTAL VAGINAL HYSTERECTOMY    . TUBAL LIGATION      There were no vitals filed for this visit.  Subjective Assessment - 04/16/19 1435    Subjective  Pt states that when she took her bandages off she really could tell a difference.    Pertinent History  CAD , COPD    How long can you stand comfortably?  2 minutes    How long can you walk comfortably?  189ft uses a cane and O2    Patient Stated Goals  decreaed edema, to be able to walk and stand longer    Currently in Pain?  Yes    Pain Score  4     Pain Location  Leg    Pain Orientation  Right;Left    Pain Descriptors / Indicators  Tightness;Tender;Throbbing    Pain Type  Chronic pain    Pain Onset  More  than a month ago    Pain Frequency  Constant    Aggravating Factors   dependent position    Pain Relieving Factors  elevation    Effect of Pain on Daily Activities  limits              [facility] Adult PT Treatment/Exercise - 04/16/19 0001      Manual Therapy   Manual Therapy  Compression Bandaging;Manual Lymphatic Drainage (MLD)    Manual therapy comments  completed seperate from all other aspects of treatment.     Manual Lymphatic Drainage (MLD)  To include supraclavicular, deep and superfical abdominal, routing fluid using inguuinal axillary anastomosis and LE.  Posterior aspect done sidelying.    Compression Bandaging  using foam and multilayer short stretch bandaging from MTP to knee B.                PT Short Term Goals - 04/14/19 1448      PT SHORT TERM GOAL #1   Title  PT to lose 3-5 cm volume from both LE to reduce risk of cellulitis    Time  4    Period  Weeks    Status  On-going    Target Date  05/09/19      PT SHORT TERM GOAL #2   Title  PT to know the sign and sx of cellulitis and the importance of getting immediate medical attention.    Time  4    Period  Weeks    Status  On-going        PT Long Term Goals - 04/14/19 1449      PT LONG TERM GOAL #1   Title  Pt to have lost between 5-8 cm of fluid to allow pt to be able to stand for 10 minutes in order to complete self grooming activites.    Time  8    Period  Weeks    Status  On-going      PT LONG TERM GOAL #2   Title  Pt LE pain to be no greater than 2/10  to be able to walk with her cane for ten minutes to allow pt to get to her medical appointments with greater ease.    Time  8    Period  Weeks    Status  On-going      PT LONG TERM GOAL #3   Title  PT to have obtained and be using a compression pump for maintenance phase of treatment.    Time  8    Period  Weeks    Status  On-going      PT LONG TERM GOAL #4   Title  PT to have thigh high compression garments and to be wearing daily.     Time  8    Period  Weeks    Status  On-going            Plan - 04/16/19 1438    Clinical Impression Statement  Pt has not obtained short stretch to bandage her thigh area at this time.  Noted increased induration along posterior aspect of both thigh.    Personal Factors and Comorbidities  Comorbidity 3+    Comorbidities  B TKR, CAD, COPD, back surgery.    Examination-Activity Limitations  Lift;Locomotion Level;Stand;Transfers    Examination-Participation Restrictions  Community Activity;Cleaning;Laundry    Stability/Clinical Decision Making  Evolving/Moderate complexity    Rehab Potential  Good    PT Frequency  3x / week    PT Duration  8 weeks    PT Treatment/Interventions  Compression bandaging;Manual lymph drainage;Patient/family education;ADLs/Self Care Home Management    PT Next Visit Plan  Continue total decongestive techniques bandaging to knee level until pt brings in thigh bandages then begin thigh bandaging. Measure on Fridays.    PT Home Exercise Plan  ankle pumps, LAQ, hip ab/adduction, marching and diaphragmic breathing.       Patient will benefit from skilled therapeutic intervention in order to improve the following deficits and impairments:  Difficulty walking, Increased edema, Obesity, Pain  Visit Diagnosis: Lymphedema, not elsewhere classified  Pain in right leg  Pain in left leg  Difficulty in walking, not elsewhere classified     Problem List Patient Active Problem List   Diagnosis Date Noted  . Chronic acquired lymphedema 02/07/2019  . Bilateral shoulder pain 02/07/2019  . Memory loss 09/16/2018  . Chest pain 08/06/2018  . Prolonged QT interval 08/06/2018  . Chronic obstructive pulmonary disease ([place]) 05/06/2018  . Orthostatic hypotension 10/24/2017  . Fatty liver 10/24/2017  . Thrombocytopenia ([place]) 09/29/2017  . Visual hallucinations   . Auditory hallucination   . Acute encephalopathy 09/26/2017  . Lower urinary tract infectious  disease 09/26/2017  . Bipolar I disorder, most recent episode depressed, severe w psychosis ([place]) 06/21/2016  . History of MI (myocardial infarction) 06/21/2016  . Spinal stenosis of lumbar region with neurogenic claudication 03/29/2016  . Severe obesity (BMI >= 40) ([place]) 07/05/2015  . Hyperlipidemia LDL goal <130 07/05/2015  . MDD (major depressive disorder), single episode, severe with psychotic features ([place]) 01/21/2015  . 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 Bilateral knee pain 01/07/2014  . Scar condition and fibrosis of skin 12/11/2013  . Essential hypertension, benign 07/01/2013  . Asthma with acute exacerbation 11/04/2012  . OSA on CPAP 11/04/2012  . Back pain, chronic 08/27/2012  . Vitamin D insufficiency 04/04/2012  . Insomnia due to mental disorder 04/04/2012  . Anxiety 04/04/2012  . OCD (obsessive compulsive disorder) 04/04/2012  . PTSD (post-traumatic stress disorder) 06/28/2011  . Coronary artery disease 08/26/2010  . FATTY LIVER DISEASE 03/29/2009  . GERD 03/01/2009  . Morbid obesity ([place]) 02/15/2009  . Hypothyroidism 02/12/2009  . HYPERCHOLESTEROLEMIA 02/12/2009   [name], PT CLT [phone] 04/16/2019, 2:40 PM  [facility] [address] [place], [place], [address] Phone: [phone]   Fax:  [phone]  Name: Allison Sharp MRN: [id number] Date of Birth: 1955/07/02

## 2019-04-17 ENCOUNTER — Ambulatory Visit (HOSPITAL_COMMUNITY): Payer: Medicare Other | Admitting: Physical Therapy

## 2019-04-17 ENCOUNTER — Telehealth (HOSPITAL_COMMUNITY): Payer: Self-pay | Admitting: Physical Therapy

## 2019-04-17 NOTE — Telephone Encounter (Signed)
[name] ( caregiver) called l/m to cx this apptment patient is sick today

## 2019-04-21 ENCOUNTER — Other Ambulatory Visit: Payer: Self-pay | Admitting: Family Medicine

## 2019-04-21 DIAGNOSIS — E559 Vitamin D deficiency, unspecified: Secondary | ICD-10-CM

## 2019-04-22 ENCOUNTER — Other Ambulatory Visit: Payer: Self-pay

## 2019-04-22 ENCOUNTER — Ambulatory Visit (HOSPITAL_COMMUNITY): Payer: Medicare Other | Admitting: Physical Therapy

## 2019-04-22 DIAGNOSIS — I890 Lymphedema, not elsewhere classified: Secondary | ICD-10-CM

## 2019-04-22 DIAGNOSIS — M79605 Pain in left leg: Secondary | ICD-10-CM

## 2019-04-22 DIAGNOSIS — M79604 Pain in right leg: Secondary | ICD-10-CM

## 2019-04-22 DIAGNOSIS — R262 Difficulty in walking, not elsewhere classified: Secondary | ICD-10-CM

## 2019-04-22 NOTE — Therapy (Signed)
[place], [place], [address] Phone: [phone]   Fax:  [phone]  Physical Therapy Treatment  Patient Details  Name: Allison Sharp MRN: [id number] Date of Birth: 1956-04-04 Referring Provider (PT): Ronnald Collum    Encounter Date: 04/22/2019  PT End of Session - 04/22/19 1750    Visit Number  4    Number of Visits  24    Date for PT Re-Evaluation  06/06/19    Authorization - Visit Number  4    Authorization - Number of Visits  10    PT Start Time  [id number]    PT Stop Time  [id number]    PT Time Calculation (min)  90 min    Activity Tolerance  Patient tolerated treatment well    Behavior During Therapy  [facility] for tasks assessed/performed       Past Medical History:  Diagnosis Date  . Allergy   . Anxiety   . Arthritis   . Asthma   . Bipolar 1 disorder ([place])   . CAD (coronary artery disease)   . Cellulitis   . CHF (congestive heart failure) (HCC)    diastolic dysfunction  . Chronic back pain   . Chronic headaches   . Complication of anesthesia    States she typically gets sick s/p anesthesia  . Contusion of sacrum   . COPD (chronic obstructive pulmonary disease) ([place])   . Dementia ([place])   . Depression   . Dyspnea    PFT 03/05/09 FEV1 2.77(98%), FVC 3.25(86%), FEV1% 85, TLC 5.88(99%), DLCO 60% ,  Methacholine challenge 03/16/09 normal ,  CT chest 03/12/09 no pulmonary disease  . Fungal infection   . GERD (gastroesophageal reflux disease)   . History of colonoscopy 10/17/2002   by Dr Rehman-> distal non-specific proctitis, small ext hemorrhoids,   . HTN (hypertension)   . Hyperlipidemia   . Hyperthyroidism    radioactive - thyroid cancer   . Migraine headache   . Morbid obesity with body mass index of 50.0-59.9 in adult [facility]) JAN 2011 370 LBS   2004 311 BMI 45.9  . Myocardial infarction ([place])    NOV 1997  . OSA on CPAP    she had been on 2L O2 at night but that was stopped  . PONV (postoperative  nausea and vomiting)   . PTSD (post-traumatic stress disorder)   . PTSD (post-traumatic stress disorder)   . Suicidal ideation   . Urine incontinence   . Vitamin D deficiency     Past Surgical History:  Procedure Laterality Date  . ABDOMINAL HYSTERECTOMY     sept 1996  . APPENDECTOMY    . BACK SURGERY  2008  . CARDIAC CATHETERIZATION     nov 1997  . CHOLECYSTECTOMY    . COLONOSCOPY  10/17/2002    Distal proctitis, small external hemorrhoids, otherwise/  normal colonoscopy. Suspect rectal bleeding secondary to hemorrhoids  . ESOPHAGOGASTRODUODENOSCOPY  03/18/09   fundic gland polyps/mild gastritis  . HERNIA REPAIR  1978  . JOINT REPLACEMENT     bil knee replacement  . KNEE ARTHROSCOPY    . LEFT HEART CATH AND CORONARY ANGIOGRAPHY N/A 08/08/2018   Procedure: LEFT HEART CATH AND CORONARY ANGIOGRAPHY;  Surgeon: Jettie Booze, MD;  Location: [facility];  Service: Cardiovascular;  Laterality: N/A;  . MULTIPLE EXTRACTIONS WITH ALVEOLOPLASTY N/A 08/16/2015   Procedure: EXTRACTION OF TEETH THREE, SIX, EIGHT, NINE, ELEVEN, FOURTEEN, FIFTEEN, TWENTY-EIGHT WITH ALVEOLOPLASTY;  Surgeon: Diona Browner, DDS;  Location: [facility];  Service: Oral Surgery;  Laterality: N/A;  . ROTATOR CUFF REPAIR Right   . TONSILLECTOMY    . TOTAL VAGINAL HYSTERECTOMY    . TUBAL LIGATION      There were no vitals filed for this visit.  Subjective Assessment - 04/22/19 1746    Subjective  pt accompanied by daughter.  STates she had the stomach flu and wasn't able to make her appointments.  Daughter requested ordering sheet again for thigh bandaging and reported they would be able to order them the first of the month.    Currently in Pain?  No/denies            LYMPHEDEMA/ONCOLOGY QUESTIONNAIRE - 04/22/19 1747      What other symptoms do you have   Are you Having Heaviness or Tightness  --    Are you having Pain  --    Are you having pitting edema  --    Body Site  --    Is it Hard or  Difficult finding clothes that fit  --    Do you have infections  --      Lymphedema Stage   Stage  --      Lymphedema Assessments   Lymphedema Assessments  Lower extremities      Right Lower Extremity Lymphedema   20 cm Proximal to Suprapatella  89.5 cm   was 94.3 on 11/13   10 cm Proximal to Suprapatella  87 cm   was 90.7   At Midpatella/Popliteal Crease  64.5 cm   was 68.8   30 cm Proximal to Floor at Lateral Plantar Foot  62.7 cm   was 68.3   20 cm Proximal to Floor at Lateral Plantar Foot  59 1   was 66.8   10 cm Proximal to Floor at Lateral Malleoli  49 cm   was 51   Circumference of ankle/heel  37 cm.   was 37.7   5 cm Proximal to 1st MTP Joint  26.4 cm   was 28   Across MTP Joint  25.2 cm   was 27     Left Lower Extremity Lymphedema   20 cm Proximal to Suprapatella  89 cm   was 93   10 cm Proximal to Suprapatella  84 cm   was 87.8   At Midpatella/Popliteal Crease  62 cm   was 64.8   30 cm Proximal to Floor at Lateral Plantar Foot  61.5 cm   was 66.2   20 cm Proximal to Floor at Lateral Plantar Foot  55.5 cm   was 58.3   10 cm Proximal to Floor at Lateral Malleoli  47 cm   was 49   Circumference of ankle/heel  39 cm.   was 40.3   5 cm Proximal to 1st MTP Joint  27 cm   was 28.2   Across MTP Joint  26 cm   was 26.8               [facility] Adult PT Treatment/Exercise - 04/22/19 0001      Manual Therapy   Manual Therapy  Compression Bandaging;Manual Lymphatic Drainage (MLD);Other (comment)    Manual therapy comments  completed seperate from all other aspects of treatment.     Manual Lymphatic Drainage (MLD)  To include supraclavicular, deep and superfical abdominal, routing fluid using inguuinal axillary anastomosis and LE.  Posterior aspect done sidelying.    Compression Bandaging  using foam  and multilayer short stretch bandaging from MTP to knee B.     Other Manual Therapy  measurement             PT Education - 04/22/19 1751     Education Details  informed of other places bandaging could be purchased and given new sheet with what products to order.  Answered all questions related to bandaging and insurance coverage.    Person(s) Educated  Patient;Child(ren)    Methods  Explanation;Handout    Comprehension  Verbalized understanding       PT Short Term Goals - 04/14/19 1448      PT SHORT TERM GOAL #1   Title  PT to lose 3-5 cm volume from both LE to reduce risk of cellulitis    Time  4    Period  Weeks    Status  On-going    Target Date  05/09/19      PT SHORT TERM GOAL #2   Title  PT to know the sign and sx of cellulitis and the importance of getting immediate medical attention.    Time  4    Period  Weeks    Status  On-going        PT Long Term Goals - 04/14/19 1449      PT LONG TERM GOAL #1   Title  Pt to have lost between 5-8 cm of fluid to allow pt to be able to stand for 10 minutes in order to complete self grooming activites.    Time  8    Period  Weeks    Status  On-going      PT LONG TERM GOAL #2   Title  Pt LE pain to be no greater than 2/10  to be able to walk with her cane for ten minutes to allow pt to get to her medical appointments with greater ease.    Time  8    Period  Weeks    Status  On-going      PT LONG TERM GOAL #3   Title  PT to have obtained and be using a compression pump for maintenance phase of treatment.    Time  8    Period  Weeks    Status  On-going      PT LONG TERM GOAL #4   Title  PT to have thigh high compression garments and to be wearing daily.    Time  8    Period  Weeks    Status  On-going            Plan - 04/22/19 1753    Clinical Impression Statement  Answered questions asked by daughter regarding insurance coverage for lymph products, given another copy of items to purchase for thigh bandaging and handout with items.  Measured this session with noted reduction in bilateral LE's.  Pt with moderate amount of induration and some tenderness with  massage. Moisturized well prior to rebandaging.    Personal Factors and Comorbidities  Comorbidity 3+    Comorbidities  B TKR, CAD, COPD, back surgery.    Examination-Activity Limitations  Lift;Locomotion Level;Stand;Transfers    Examination-Participation Restrictions  Community Activity;Cleaning;Laundry    Stability/Clinical Decision Making  Evolving/Moderate complexity    Rehab Potential  Good    PT Frequency  3x / week    PT Duration  8 weeks    PT Treatment/Interventions  Compression bandaging;Manual lymph drainage;Patient/family education;ADLs/Self Care Home Management    PT Next Visit Plan  Continue total decongestive techniques bandaging to knee level until pt brings in thigh bandages then begin thigh bandaging. Measure on Fridays.    PT Home Exercise Plan  ankle pumps, LAQ, hip ab/adduction, marching and diaphragmic breathing.       Patient will benefit from skilled therapeutic intervention in order to improve the following deficits and impairments:  Difficulty walking, Increased edema, Obesity, Pain  Visit Diagnosis: Pain in left leg  Difficulty in walking, not elsewhere classified  Pain in right leg  Lymphedema, not elsewhere classified     Problem List Patient Active Problem List   Diagnosis Date Noted  . Chronic acquired lymphedema 02/07/2019  . Bilateral shoulder pain 02/07/2019  . Memory loss 09/16/2018  . Chest pain 08/06/2018  . Prolonged QT interval 08/06/2018  . Chronic obstructive pulmonary disease ([place]) 05/06/2018  . Orthostatic hypotension 10/24/2017  . Fatty liver 10/24/2017  . Thrombocytopenia ([place]) 09/29/2017  . Visual hallucinations   . Auditory hallucination   . Acute encephalopathy 09/26/2017  . Lower urinary tract infectious disease 09/26/2017  . Bipolar I disorder, most recent episode depressed, severe w psychosis ([place]) 06/21/2016  . History of MI (myocardial infarction) 06/21/2016  . Spinal stenosis of lumbar region with neurogenic  claudication 03/29/2016  . Severe obesity (BMI >= 40) ([place]) 07/05/2015  . Hyperlipidemia LDL goal <130 07/05/2015  . MDD (major depressive disorder), single episode, severe with psychotic features ([place]) 01/21/2015  . 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 Bilateral knee pain 01/07/2014  . Scar condition and fibrosis of skin 12/11/2013  . Essential hypertension, benign 07/01/2013  . Asthma with acute exacerbation 11/04/2012  . OSA on CPAP 11/04/2012  . Back pain, chronic 08/27/2012  . Vitamin D insufficiency 04/04/2012  . Insomnia due to mental disorder 04/04/2012  . Anxiety 04/04/2012  . OCD (obsessive compulsive disorder) 04/04/2012  . PTSD (post-traumatic stress disorder) 06/28/2011  . Coronary artery disease 08/26/2010  . FATTY LIVER DISEASE 03/29/2009  . GERD 03/01/2009  . Morbid obesity ([place]) 02/15/2009  . Hypothyroidism 02/12/2009  . HYPERCHOLESTEROLEMIA 02/12/2009   Teena Irani, PTA/CLT [phone]  Teena Irani 04/22/2019, 5:56 PM  [facility] [address] [place], [place], [address] Phone: [phone]   Fax:  [phone]  Name: TEMIA JENKINS MRN: [id number] Date of Birth: 06-22-1955

## 2019-04-28 ENCOUNTER — Other Ambulatory Visit: Payer: Self-pay

## 2019-04-28 ENCOUNTER — Ambulatory Visit (HOSPITAL_COMMUNITY): Payer: Medicare Other | Admitting: Physical Therapy

## 2019-04-28 ENCOUNTER — Encounter (HOSPITAL_COMMUNITY): Payer: Self-pay | Admitting: Physical Therapy

## 2019-04-28 DIAGNOSIS — I890 Lymphedema, not elsewhere classified: Secondary | ICD-10-CM | POA: Diagnosis not present

## 2019-04-28 NOTE — Therapy (Signed)
[place], [place], [address] Phone: [phone]   Fax:  [phone]  Physical Therapy Treatment  Patient Details  Name: Allison Sharp MRN: [id number] Date of Birth: November 17, 1955 Referring Provider (PT): Ronnald Collum    Encounter Date: 04/28/2019  PT End of Session - 04/28/19 1537    Visit Number  5    Number of Visits  24    Date for PT Re-Evaluation  06/06/19    Authorization - Visit Number  5    Authorization - Number of Visits  10    PT Start Time  1400    PT Stop Time  1525    PT Time Calculation (min)  85 min    Activity Tolerance  Patient tolerated treatment well    Behavior During Therapy  [facility] for tasks assessed/performed       Past Medical History:  Diagnosis Date  . Allergy   . Anxiety   . Arthritis   . Asthma   . Bipolar 1 disorder ([place])   . CAD (coronary artery disease)   . Cellulitis   . CHF (congestive heart failure) (HCC)    diastolic dysfunction  . Chronic back pain   . Chronic headaches   . Complication of anesthesia    States she typically gets sick s/p anesthesia  . Contusion of sacrum   . COPD (chronic obstructive pulmonary disease) ([place])   . Dementia ([place])   . Depression   . Dyspnea    PFT 03/05/09 FEV1 2.77(98%), FVC 3.25(86%), FEV1% 85, TLC 5.88(99%), DLCO 60% ,  Methacholine challenge 03/16/09 normal ,  CT chest 03/12/09 no pulmonary disease  . Fungal infection   . GERD (gastroesophageal reflux disease)   . History of colonoscopy 10/17/2002   by Dr Rehman-> distal non-specific proctitis, small ext hemorrhoids,   . HTN (hypertension)   . Hyperlipidemia   . Hyperthyroidism    radioactive - thyroid cancer   . Migraine headache   . Morbid obesity with body mass index of 50.0-59.9 in adult [facility]) JAN 2011 370 LBS   2004 311 BMI 45.9  . Myocardial infarction ([place])    NOV 1997  . OSA on CPAP    she had been on 2L O2 at night but that was stopped  . PONV (postoperative  nausea and vomiting)   . PTSD (post-traumatic stress disorder)   . PTSD (post-traumatic stress disorder)   . Suicidal ideation   . Urine incontinence   . Vitamin D deficiency     Past Surgical History:  Procedure Laterality Date  . ABDOMINAL HYSTERECTOMY     sept 1996  . APPENDECTOMY    . BACK SURGERY  2008  . CARDIAC CATHETERIZATION     nov 1997  . CHOLECYSTECTOMY    . COLONOSCOPY  10/17/2002    Distal proctitis, small external hemorrhoids, otherwise/  normal colonoscopy. Suspect rectal bleeding secondary to hemorrhoids  . ESOPHAGOGASTRODUODENOSCOPY  03/18/09   fundic gland polyps/mild gastritis  . HERNIA REPAIR  1978  . JOINT REPLACEMENT     bil knee replacement  . KNEE ARTHROSCOPY    . LEFT HEART CATH AND CORONARY ANGIOGRAPHY N/A 08/08/2018   Procedure: LEFT HEART CATH AND CORONARY ANGIOGRAPHY;  Surgeon: Jettie Booze, MD;  Location: [place] [facility];  Service: Cardiovascular;  Laterality: N/A;  . MULTIPLE EXTRACTIONS WITH ALVEOLOPLASTY N/A 08/16/2015   Procedure: EXTRACTION OF TEETH THREE, SIX, EIGHT, NINE, ELEVEN, FOURTEEN, FIFTEEN, TWENTY-EIGHT WITH ALVEOLOPLASTY;  Surgeon: Diona Browner, DDS;  Location: [facility];  Service: Oral Surgery;  Laterality: N/A;  . ROTATOR CUFF REPAIR Right   . TONSILLECTOMY    . TOTAL VAGINAL HYSTERECTOMY    . TUBAL LIGATION      There were no vitals filed for this visit.  Subjective Assessment - 04/28/19 1536    Subjective  Pt states that she will have her other bandages here on Thursday.  States that she can tell that she is losing fluid.               [facility] Adult PT Treatment/Exercise - 04/28/19 0001      Manual Therapy   Manual Therapy  Compression Bandaging;Manual Lymphatic Drainage (MLD);Other (comment)    Manual therapy comments  completed seperate from all other aspects of treatment.     Manual Lymphatic Drainage (MLD)  To include supraclavicular, deep and superfical abdominal, routing fluid using inguuinal axillary  anastomosis and LE.  Posterior aspect done sidelying.    Compression Bandaging  using foam and multilayer short stretch bandaging from toes  to knee B.                PT Short Term Goals - 04/14/19 1448      PT SHORT TERM GOAL #1   Title  PT to lose 3-5 cm volume from both LE to reduce risk of cellulitis    Time  4    Period  Weeks    Status  On-going    Target Date  05/09/19      PT SHORT TERM GOAL #2   Title  PT to know the sign and sx of cellulitis and the importance of getting immediate medical attention.    Time  4    Period  Weeks    Status  On-going        PT Long Term Goals - 04/14/19 1449      PT LONG TERM GOAL #1   Title  Pt to have lost between 5-8 cm of fluid to allow pt to be able to stand for 10 minutes in order to complete self grooming activites.    Time  8    Period  Weeks    Status  On-going      PT LONG TERM GOAL #2   Title  Pt LE pain to be no greater than 2/10  to be able to walk with her cane for ten minutes to allow pt to get to her medical appointments with greater ease.    Time  8    Period  Weeks    Status  On-going      PT LONG TERM GOAL #3   Title  PT to have obtained and be using a compression pump for maintenance phase of treatment.    Time  8    Period  Weeks    Status  On-going      PT LONG TERM GOAL #4   Title  PT to have thigh high compression garments and to be wearing daily.    Time  8    Period  Weeks    Status  On-going            Plan - 04/28/19 1538    Clinical Impression Statement  Pt with noted decreased volume in feet and ankle area.  Pt continues to have induration in posterior aspect of thigh.    Personal Factors and Comorbidities  Comorbidity 3+    Comorbidities  B  TKR, CAD, COPD, back surgery.    Examination-Activity Limitations  Lift;Locomotion Level;Stand;Transfers    Examination-Participation Restrictions  Community Activity;Cleaning;Laundry    Stability/Clinical Decision Making  Evolving/Moderate  complexity    Rehab Potential  Good    PT Frequency  3x / week    PT Duration  8 weeks    PT Treatment/Interventions  Compression bandaging;Manual lymph drainage;Patient/family education;ADLs/Self Care Home Management    PT Next Visit Plan  Continue total decongestive techniques bandaging to knee level until pt brings in thigh bandages then begin thigh bandaging. Measure on Fridays.    PT Home Exercise Plan  ankle pumps, LAQ, hip ab/adduction, marching and diaphragmic breathing.       Patient will benefit from skilled therapeutic intervention in order to improve the following deficits and impairments:  Difficulty walking, Increased edema, Obesity, Pain  Visit Diagnosis: Lymphedema, not elsewhere classified     Problem List Patient Active Problem List   Diagnosis Date Noted  . Chronic acquired lymphedema 02/07/2019  . Bilateral shoulder pain 02/07/2019  . Memory loss 09/16/2018  . Chest pain 08/06/2018  . Prolonged QT interval 08/06/2018  . Chronic obstructive pulmonary disease ([place]) 05/06/2018  . Orthostatic hypotension 10/24/2017  . Fatty liver 10/24/2017  . Thrombocytopenia ([place]) 09/29/2017  . Visual hallucinations   . Auditory hallucination   . Acute encephalopathy 09/26/2017  . Lower urinary tract infectious disease 09/26/2017  . Bipolar I disorder, most recent episode depressed, severe w psychosis ([place]) 06/21/2016  . History of MI (myocardial infarction) 06/21/2016  . Spinal stenosis of lumbar region with neurogenic claudication 03/29/2016  . Severe obesity (BMI >= 40) ([place]) 07/05/2015  . Hyperlipidemia LDL goal <130 07/05/2015  . MDD (major depressive disorder), single episode, severe with psychotic features ([place]) 01/21/2015  . 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 Bilateral knee pain 01/07/2014  . Scar condition and fibrosis of skin 12/11/2013  . Essential hypertension, benign 07/01/2013  . Asthma with acute exacerbation 11/04/2012  . OSA on CPAP 11/04/2012  .  Back pain, chronic 08/27/2012  . Vitamin D insufficiency 04/04/2012  . Insomnia due to mental disorder 04/04/2012  . Anxiety 04/04/2012  . OCD (obsessive compulsive disorder) 04/04/2012  . PTSD (post-traumatic stress disorder) 06/28/2011  . Coronary artery disease 08/26/2010  . FATTY LIVER DISEASE 03/29/2009  . GERD 03/01/2009  . Morbid obesity ([place]) 02/15/2009  . Hypothyroidism 02/12/2009  . HYPERCHOLESTEROLEMIA 02/12/2009    [name], PT CLT [phone] 04/28/2019, 3:40 PM  [facility] [address] [place], [place], [address] Phone: [phone]   Fax:  [phone]  Name: ILA BUZO MRN: [id number] Date of Birth: 09-29-1955

## 2019-04-30 ENCOUNTER — Ambulatory Visit (HOSPITAL_COMMUNITY): Payer: Medicare Other | Attending: Nurse Practitioner | Admitting: Physical Therapy

## 2019-04-30 ENCOUNTER — Other Ambulatory Visit: Payer: Self-pay

## 2019-04-30 DIAGNOSIS — R262 Difficulty in walking, not elsewhere classified: Secondary | ICD-10-CM

## 2019-04-30 DIAGNOSIS — M79605 Pain in left leg: Secondary | ICD-10-CM

## 2019-04-30 DIAGNOSIS — M79604 Pain in right leg: Secondary | ICD-10-CM

## 2019-04-30 DIAGNOSIS — I890 Lymphedema, not elsewhere classified: Secondary | ICD-10-CM

## 2019-04-30 NOTE — Therapy (Signed)
[place], [place], [address] Phone: [phone]   Fax:  [phone]  Physical Therapy Treatment  Patient Details  Name: Allison Sharp MRN: [id number] Date of Birth: 1955-08-22 Referring Provider (PT): Ronnald Collum    Encounter Date: 04/30/2019  PT End of Session - 04/30/19 1641    Visit Number  6    Number of Visits  24    Date for PT Re-Evaluation  06/06/19    Authorization - Visit Number  6    Authorization - Number of Visits  10    PT Start Time  1400    PT Stop Time  1445    PT Time Calculation (min)  45 min    Activity Tolerance  Patient tolerated treatment well    Behavior During Therapy  [facility] for tasks assessed/performed       Past Medical History:  Diagnosis Date  . Allergy   . Anxiety   . Arthritis   . Asthma   . Bipolar 1 disorder ([place])   . CAD (coronary artery disease)   . Cellulitis   . CHF (congestive heart failure) (HCC)    diastolic dysfunction  . Chronic back pain   . Chronic headaches   . Complication of anesthesia    States she typically gets sick s/p anesthesia  . Contusion of sacrum   . COPD (chronic obstructive pulmonary disease) ([place])   . Dementia ([place])   . Depression   . Dyspnea    PFT 03/05/09 FEV1 2.77(98%), FVC 3.25(86%), FEV1% 85, TLC 5.88(99%), DLCO 60% ,  Methacholine challenge 03/16/09 normal ,  CT chest 03/12/09 no pulmonary disease  . Fungal infection   . GERD (gastroesophageal reflux disease)   . History of colonoscopy 10/17/2002   by Dr Rehman-> distal non-specific proctitis, small ext hemorrhoids,   . HTN (hypertension)   . Hyperlipidemia   . Hyperthyroidism    radioactive - thyroid cancer   . Migraine headache   . Morbid obesity with body mass index of 50.0-59.9 in adult [facility]) JAN 2011 370 LBS   2004 311 BMI 45.9  . Myocardial infarction ([place])    NOV 1997  . OSA on CPAP    she had been on 2L O2 at night but that was stopped  . PONV (postoperative nausea  and vomiting)   . PTSD (post-traumatic stress disorder)   . PTSD (post-traumatic stress disorder)   . Suicidal ideation   . Urine incontinence   . Vitamin D deficiency     Past Surgical History:  Procedure Laterality Date  . ABDOMINAL HYSTERECTOMY     sept 1996  . APPENDECTOMY    . BACK SURGERY  2008  . CARDIAC CATHETERIZATION     nov 1997  . CHOLECYSTECTOMY    . COLONOSCOPY  10/17/2002    Distal proctitis, small external hemorrhoids, otherwise/  normal colonoscopy. Suspect rectal bleeding secondary to hemorrhoids  . ESOPHAGOGASTRODUODENOSCOPY  03/18/09   fundic gland polyps/mild gastritis  . HERNIA REPAIR  1978  . JOINT REPLACEMENT     bil knee replacement  . KNEE ARTHROSCOPY    . LEFT HEART CATH AND CORONARY ANGIOGRAPHY N/A 08/08/2018   Procedure: LEFT HEART CATH AND CORONARY ANGIOGRAPHY;  Surgeon: Jettie Booze, MD;  Location: [place] [facility];  Service: Cardiovascular;  Laterality: N/A;  . MULTIPLE EXTRACTIONS WITH ALVEOLOPLASTY N/A 08/16/2015   Procedure: EXTRACTION OF TEETH THREE, SIX, EIGHT, NINE, ELEVEN, FOURTEEN, FIFTEEN, TWENTY-EIGHT WITH ALVEOLOPLASTY;  Surgeon: Diona Browner, DDS;  Location: [place];  Service: Oral Surgery;  Laterality: N/A;  . ROTATOR CUFF REPAIR Right   . TONSILLECTOMY    . TOTAL VAGINAL HYSTERECTOMY    . TUBAL LIGATION      There were no vitals filed for this visit.  Subjective Assessment - 04/30/19 1639    Subjective  Pt was 45 minutes late for her appointment.  STates she is having issues with her oxygen concentrator.    Currently in Pain?  No/denies                       [facility] Adult PT Treatment/Exercise - 04/30/19 1641      Manual Therapy   Manual Therapy  Compression Bandaging;Manual Lymphatic Drainage (MLD);Other (comment)    Manual therapy comments  completed seperate from all other aspects of treatment.     Manual Lymphatic Drainage (MLD)  To include supraclavicular, deep and superfical abdominal, routing fluid  using inguuinal axillary anastomosis and LE.  Posterior aspect done sidelying.    Compression Bandaging  using foam and multilayer short stretch bandaging from toes  to knee B.                PT Short Term Goals - 04/14/19 1448      PT SHORT TERM GOAL #1   Title  PT to lose 3-5 cm volume from both LE to reduce risk of cellulitis    Time  4    Period  Weeks    Status  On-going    Target Date  05/09/19      PT SHORT TERM GOAL #2   Title  PT to know the sign and sx of cellulitis and the importance of getting immediate medical attention.    Time  4    Period  Weeks    Status  On-going        PT Long Term Goals - 04/14/19 1449      PT LONG TERM GOAL #1   Title  Pt to have lost between 5-8 cm of fluid to allow pt to be able to stand for 10 minutes in order to complete self grooming activites.    Time  8    Period  Weeks    Status  On-going      PT LONG TERM GOAL #2   Title  Pt LE pain to be no greater than 2/10  to be able to walk with her cane for ten minutes to allow pt to get to her medical appointments with greater ease.    Time  8    Period  Weeks    Status  On-going      PT LONG TERM GOAL #3   Title  PT to have obtained and be using a compression pump for maintenance phase of treatment.    Time  8    Period  Weeks    Status  On-going      PT LONG TERM GOAL #4   Title  PT to have thigh high compression garments and to be wearing daily.    Time  8    Period  Weeks    Status  On-going            Plan - 04/30/19 1642    Clinical Impression Statement  completed only 15 minutes of manual this session due to pateint being late for appointment.  Instructed pateint and daughter to contact [facility] regarding  her oxygen concentrator as it appears to have something to do with the battery.  Both verbalized understanding.  Pt is going to get her bandaging ordered and get some better shoes today.  Continued with bandaging to distal LE toes to knee.     Personal Factors and Comorbidities  Comorbidity 3+    Comorbidities  B TKR, CAD, COPD, back surgery.    Examination-Activity Limitations  Lift;Locomotion Level;Stand;Transfers    Examination-Participation Restrictions  Community Activity;Cleaning;Laundry    Stability/Clinical Decision Making  Evolving/Moderate complexity    Rehab Potential  Good    PT Frequency  3x / week    PT Duration  8 weeks    PT Treatment/Interventions  Compression bandaging;Manual lymph drainage;Patient/family education;ADLs/Self Care Home Management    PT Next Visit Plan  Continue total decongestive techniques bandaging to knee level until pt brings in thigh bandages then begin thigh bandaging. Measure on Fridays.    PT Home Exercise Plan  ankle pumps, LAQ, hip ab/adduction, marching and diaphragmic breathing.       Patient will benefit from skilled therapeutic intervention in order to improve the following deficits and impairments:  Difficulty walking, Increased edema, Obesity, Pain  Visit Diagnosis: Lymphedema, not elsewhere classified  Pain in left leg  Difficulty in walking, not elsewhere classified  Pain in right leg     Problem List Patient Active Problem List   Diagnosis Date Noted  . Chronic acquired lymphedema 02/07/2019  . Bilateral shoulder pain 02/07/2019  . Memory loss 09/16/2018  . Chest pain 08/06/2018  . Prolonged QT interval 08/06/2018  . Chronic obstructive pulmonary disease ([place]) 05/06/2018  . Orthostatic hypotension 10/24/2017  . Fatty liver 10/24/2017  . Thrombocytopenia ([place]) 09/29/2017  . Visual hallucinations   . Auditory hallucination   . Acute encephalopathy 09/26/2017  . Lower urinary tract infectious disease 09/26/2017  . Bipolar I disorder, most recent episode depressed, severe w psychosis ([place]) 06/21/2016  . History of MI (myocardial infarction) 06/21/2016  . Spinal stenosis of lumbar region with neurogenic claudication 03/29/2016  . Severe obesity (BMI >= 40) ([place])  07/05/2015  . Hyperlipidemia LDL goal <130 07/05/2015  . MDD (major depressive disorder), single episode, severe with psychotic features ([place]) 01/21/2015  . 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 Bilateral knee pain 01/07/2014  . Scar condition and fibrosis of skin 12/11/2013  . Essential hypertension, benign 07/01/2013  . Asthma with acute exacerbation 11/04/2012  . OSA on CPAP 11/04/2012  . Back pain, chronic 08/27/2012  . Vitamin D insufficiency 04/04/2012  . Insomnia due to mental disorder 04/04/2012  . Anxiety 04/04/2012  . OCD (obsessive compulsive disorder) 04/04/2012  . PTSD (post-traumatic stress disorder) 06/28/2011  . Coronary artery disease 08/26/2010  . FATTY LIVER DISEASE 03/29/2009  . GERD 03/01/2009  . Morbid obesity ([place]) 02/15/2009  . Hypothyroidism 02/12/2009  . HYPERCHOLESTEROLEMIA 02/12/2009   Teena Irani, PTA/CLT [phone]  Teena Irani 04/30/2019, 4:44 PM  [facility] [address] [place], [place], [address] Phone: [phone]   Fax:  [phone]  Name: SHEEMA HEERY MRN: [id number] Date of Birth: 01-08-1956

## 2019-05-01 ENCOUNTER — Telehealth (HOSPITAL_COMMUNITY): Payer: Self-pay | Admitting: Physical Therapy

## 2019-05-01 NOTE — Telephone Encounter (Signed)
[name] called back -she will see if she can get someone to bring Allison Sharp-[name] has to work . NF 05/01/2019 L/m yesterday and today to remind pt of apptments. AF and CR agreed for pt to have only 57mins. AF and NF l/m 05/01/2019

## 2019-05-01 NOTE — Telephone Encounter (Signed)
L/m yesterday and today to remind pt of apptments. AF and CR agreed for pt to have only 24mins. AF and NF l/m 05/01/2019

## 2019-05-02 ENCOUNTER — Other Ambulatory Visit: Payer: Self-pay

## 2019-05-02 ENCOUNTER — Encounter (HOSPITAL_COMMUNITY): Payer: Self-pay | Admitting: Physical Therapy

## 2019-05-02 ENCOUNTER — Ambulatory Visit (HOSPITAL_COMMUNITY): Payer: Medicare Other | Admitting: Physical Therapy

## 2019-05-02 ENCOUNTER — Telehealth (HOSPITAL_COMMUNITY): Payer: Self-pay | Admitting: Physical Therapy

## 2019-05-02 DIAGNOSIS — I890 Lymphedema, not elsewhere classified: Secondary | ICD-10-CM

## 2019-05-02 DIAGNOSIS — M79605 Pain in left leg: Secondary | ICD-10-CM

## 2019-05-02 DIAGNOSIS — R262 Difficulty in walking, not elsewhere classified: Secondary | ICD-10-CM

## 2019-05-02 DIAGNOSIS — M79604 Pain in right leg: Secondary | ICD-10-CM

## 2019-05-02 NOTE — Telephone Encounter (Signed)
Therapist called [name] who stated that they had no record of an order for Allison Sharp.  Therapist sent information to [name] for juxta to fit and to [name] for pump as pt insurance will not cover and pt has limited funds.  [name], [name] CLT [phone]

## 2019-05-02 NOTE — Therapy (Signed)
[place], [place], [address] Phone: [phone]   Fax:  [phone]  Physical Therapy Treatment  Patient Details  Name: Allison Sharp MRN: [id number] Date of Birth: [birth year] Referring Provider (PT): Ronnald Collum    Encounter Date: 05/02/2019  PT End of Session - 05/02/19 1212    Visit Number  7    Number of Visits  24    Date for PT Re-Evaluation  06/06/19    Authorization - Visit Number  7    Authorization - Number of Visits  10    PT Start Time  1020    PT Stop Time  1108    PT Time Calculation (min)  48 min    Activity Tolerance  Patient tolerated treatment well    Behavior During Therapy  [facility] for tasks assessed/performed       Past Medical History:  Diagnosis Date  . Allergy   . Anxiety   . Arthritis   . Asthma   . Bipolar 1 disorder ([place])   . CAD (coronary artery disease)   . Cellulitis   . CHF (congestive heart failure) (HCC)    diastolic dysfunction  . Chronic back pain   . Chronic headaches   . Complication of anesthesia    States she typically gets sick s/p anesthesia  . Contusion of sacrum   . COPD (chronic obstructive pulmonary disease) ([place])   . Dementia ([place])   . Depression   . Dyspnea    PFT 03/05/09 FEV1 2.77(98%), FVC 3.25(86%), FEV1% 85, TLC 5.88(99%), DLCO 60% ,  Methacholine challenge 03/16/09 normal ,  CT chest 03/12/09 no pulmonary disease  . Fungal infection   . GERD (gastroesophageal reflux disease)   . History of colonoscopy 10/17/2002   by Dr Rehman-> distal non-specific proctitis, small ext hemorrhoids,   . HTN (hypertension)   . Hyperlipidemia   . Hyperthyroidism    radioactive - thyroid cancer   . Migraine headache   . Morbid obesity with body mass index of 50.0-59.9 in adult [facility]) JAN 2011 370 LBS   2004 311 BMI 45.9  . Myocardial infarction ([place])    NOV 1997  . OSA on CPAP    she had been on 2L O2 at night but that was stopped  . PONV (postoperative nausea  and vomiting)   . PTSD (post-traumatic stress disorder)   . PTSD (post-traumatic stress disorder)   . Suicidal ideation   . Urine incontinence   . Vitamin D deficiency     Past Surgical History:  Procedure Laterality Date  . ABDOMINAL HYSTERECTOMY     sept 1996  . APPENDECTOMY    . BACK SURGERY  2008  . CARDIAC CATHETERIZATION     nov 1997  . CHOLECYSTECTOMY    . COLONOSCOPY  10/17/2002    Distal proctitis, small external hemorrhoids, otherwise/  normal colonoscopy. Suspect rectal bleeding secondary to hemorrhoids  . ESOPHAGOGASTRODUODENOSCOPY  03/18/09   fundic gland polyps/mild gastritis  . HERNIA REPAIR  1978  . JOINT REPLACEMENT     bil knee replacement  . KNEE ARTHROSCOPY    . LEFT HEART CATH AND CORONARY ANGIOGRAPHY N/A 08/08/2018   Procedure: LEFT HEART CATH AND CORONARY ANGIOGRAPHY;  Surgeon: Jettie Booze, MD;  Location: [place] [facility];  Service: Cardiovascular;  Laterality: N/A;  . MULTIPLE EXTRACTIONS WITH ALVEOLOPLASTY N/A 08/16/2015   Procedure: EXTRACTION OF TEETH THREE, SIX, EIGHT, NINE, ELEVEN, FOURTEEN, FIFTEEN, TWENTY-EIGHT WITH ALVEOLOPLASTY;  Surgeon: Diona Browner, DDS;  Location: [place];  Service: Oral Surgery;  Laterality: N/A;  . ROTATOR CUFF REPAIR Right   . TONSILLECTOMY    . TOTAL VAGINAL HYSTERECTOMY    . TUBAL LIGATION      There were no vitals filed for this visit.  Subjective Assessment - 05/02/19 1210    Subjective  PT late for appointment.  Pt has no questions or concerns at this time.    Pertinent History  CAD , COPD    How long can you stand comfortably?  2 minutes    How long can you walk comfortably?  144ft uses a cane and O2    Patient Stated Goals  decreaed edema, to be able to walk and stand longer    Currently in Pain?  Yes    Pain Score  2     Pain Location  Leg    Pain Orientation  Left;Right    Pain Descriptors / Indicators  Tightness    Pain Type  Chronic pain    Pain Onset  More than a month ago    Pain Frequency   Constant            LYMPHEDEMA/ONCOLOGY QUESTIONNAIRE - 05/02/19 1029      Right Lower Extremity Lymphedema   20 cm Proximal to Suprapatella  92 cm  (Pended)     10 cm Proximal to Suprapatella  86 cm  (Pended)     At Midpatella/Popliteal Crease  64.5 cm  (Pended)     30 cm Proximal to Floor at Lateral Plantar Foot  63 cm  (Pended)     20 cm Proximal to Floor at Lateral Plantar Foot  57.4 1  (Pended)     10 cm Proximal to Floor at Lateral Malleoli  46 cm  (Pended)     Circumference of ankle/heel  37.5 cm.  (Pended)     5 cm Proximal to 1st MTP Joint  27 cm  (Pended)     Across MTP Joint  26 cm  (Pended)       Left Lower Extremity Lymphedema   20 cm Proximal to Suprapatella  89 cm  (Pended)     10 cm Proximal to Suprapatella  84.4 cm  (Pended)     At Midpatella/Popliteal Crease  63 cm  (Pended)     30 cm Proximal to Floor at Lateral Plantar Foot  61.3 cm  (Pended)     20 cm Proximal to Floor at Lateral Plantar Foot  56 cm  (Pended)     10 cm Proximal to Floor at Lateral Malleoli  48 cm  (Pended)     Circumference of ankle/heel  38.9 cm.  (Pended)     5 cm Proximal to 1st MTP Joint  27.3 cm  (Pended)     Across MTP Joint  26.3 cm  (Pended)                 [facility] Adult PT Treatment/Exercise - 05/02/19 0001      Manual Therapy   Manual Therapy  Compression Bandaging;Manual Lymphatic Drainage (MLD);Other (comment)    Manual therapy comments  completed seperate from all other aspects of treatment.     Manual Lymphatic Drainage (MLD)  not  Completed due to time restraints     Compression Bandaging  using foam and multilayer short stretch bandaging from toes  to knee B.     Other Manual Therapy  measurement  PT Short Term Goals - 04/14/19 1448      PT SHORT TERM GOAL #1   Title  PT to lose 3-5 cm volume from both LE to reduce risk of cellulitis    Time  4    Period  Weeks    Status  On-going    Target Date  05/09/19      PT SHORT TERM GOAL #2    Title  PT to know the sign and sx of cellulitis and the importance of getting immediate medical attention.    Time  4    Period  Weeks    Status  On-going        PT Long Term Goals - 04/14/19 1449      PT LONG TERM GOAL #1   Title  Pt to have lost between 5-8 cm of fluid to allow pt to be able to stand for 10 minutes in order to complete self grooming activites.    Time  8    Period  Weeks    Status  On-going      PT LONG TERM GOAL #2   Title  Pt LE pain to be no greater than 2/10  to be able to walk with her cane for ten minutes to allow pt to get to her medical appointments with greater ease.    Time  8    Period  Weeks    Status  On-going      PT LONG TERM GOAL #3   Title  PT to have obtained and be using a compression pump for maintenance phase of treatment.    Time  8    Period  Weeks    Status  On-going      PT LONG TERM GOAL #4   Title  PT to have thigh high compression garments and to be wearing daily.    Time  8    Period  Weeks    Status  On-going            Plan - 05/02/19 1213    Clinical Impression Statement  Pt did not have manual this  treatment.  Measurements completed this session with slight decreased overall in RT LE, Lt LE remains the same.  Pt daughter ordered compression bandages needed to bandage the thigh area with theapist present in room.  Therapist suggested that patient wash her short stretch garment on Sunday, therapist gave pt sheet on caring for short stretch bandages.  bandaged toes as pt stated that toes are uncomfortable if they are unwrapped.    Personal Factors and Comorbidities  Comorbidity 3+    Comorbidities  B TKR, CAD, COPD, back surgery.    Examination-Activity Limitations  Lift;Locomotion Level;Stand;Transfers    Examination-Participation Restrictions  Community Activity;Cleaning;Laundry    Stability/Clinical Decision Making  Evolving/Moderate complexity    Rehab Potential  Good    PT Frequency  3x / week    PT Duration   8 weeks    PT Treatment/Interventions  Compression bandaging;Manual lymph drainage;Patient/family education;ADLs/Self Care Home Management    PT Next Visit Plan  Continue total decongestive techniques bandaging to knee level until pt brings in thigh bandages then begin thigh bandaging. Measure on Fridays.    PT Home Exercise Plan  ankle pumps, LAQ, hip ab/adduction, marching and diaphragmic breathing.       Patient will benefit from skilled therapeutic intervention in order to improve the following deficits and impairments:  Difficulty walking, Increased edema, Obesity, Pain  Visit Diagnosis: Lymphedema, not elsewhere classified  Pain in left leg  Difficulty in walking, not elsewhere classified  Pain in right leg     Problem List Patient Active Problem List   Diagnosis Date Noted  . Chronic acquired lymphedema 02/07/2019  . Bilateral shoulder pain 02/07/2019  . Memory loss 09/16/2018  . Chest pain 08/06/2018  . Prolonged QT interval 08/06/2018  . Chronic obstructive pulmonary disease ([place]) 05/06/2018  . Orthostatic hypotension 10/24/2017  . Fatty liver 10/24/2017  . Thrombocytopenia ([place]) 09/29/2017  . Visual hallucinations   . Auditory hallucination   . Acute encephalopathy 09/26/2017  . Lower urinary tract infectious disease 09/26/2017  . Bipolar I disorder, most recent episode depressed, severe w psychosis ([place]) 06/21/2016  . History of MI (myocardial infarction) 06/21/2016  . Spinal stenosis of lumbar region with neurogenic claudication 03/29/2016  . Severe obesity (BMI >= 40) ([place]) 07/05/2015  . Hyperlipidemia LDL goal <130 07/05/2015  . MDD (major depressive disorder), single episode, severe with psychotic features ([place]) 01/21/2015  . 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 Bilateral knee pain 01/07/2014  . Scar condition and fibrosis of skin 12/11/2013  . Essential hypertension, benign 07/01/2013  . Asthma with acute exacerbation 11/04/2012  . OSA on CPAP  11/04/2012  . Back pain, chronic 08/27/2012  . Vitamin D insufficiency 04/04/2012  . Insomnia due to mental disorder 04/04/2012  . Anxiety 04/04/2012  . OCD (obsessive compulsive disorder) 04/04/2012  . PTSD (post-traumatic stress disorder) 06/28/2011  . Coronary artery disease 08/26/2010  . FATTY LIVER DISEASE 03/29/2009  . GERD 03/01/2009  . Morbid obesity ([place]) 02/15/2009  . Hypothyroidism 02/12/2009  . HYPERCHOLESTEROLEMIA 02/12/2009    [name], PT CLT [phone] 05/02/2019, 12:17 PM  [facility] [address] [place], [place], [address] Phone: [phone]   Fax:  [phone]  Name: KUSHANA YEBRA MRN: [id number] Date of Birth: 03/19/1956

## 2019-05-05 ENCOUNTER — Ambulatory Visit (HOSPITAL_COMMUNITY): Payer: Medicare Other | Admitting: Physical Therapy

## 2019-05-05 ENCOUNTER — Other Ambulatory Visit: Payer: Self-pay

## 2019-05-05 DIAGNOSIS — M79605 Pain in left leg: Secondary | ICD-10-CM

## 2019-05-05 DIAGNOSIS — I890 Lymphedema, not elsewhere classified: Secondary | ICD-10-CM | POA: Diagnosis not present

## 2019-05-05 NOTE — Therapy (Signed)
[place], [place], [address] Phone: [phone]   Fax:  [phone]  Physical Therapy Treatment  Patient Details  Name: Allison Sharp MRN: [id number] Date of Birth: 03/24/1956 Referring Provider (PT): Ronnald Collum    Encounter Date: 05/05/2019  PT End of Session - 05/05/19 1526    Visit Number  8    Number of Visits  24    Date for PT Re-Evaluation  06/06/19    Authorization - Visit Number  8    Authorization - Number of Visits  10    PT Start Time  1320    PT Stop Time  1448    PT Time Calculation (min)  88 min    Activity Tolerance  Patient tolerated treatment well    Behavior During Therapy  [facility] for tasks assessed/performed       Past Medical History:  Diagnosis Date  . Allergy   . Anxiety   . Arthritis   . Asthma   . Bipolar 1 disorder ([place])   . CAD (coronary artery disease)   . Cellulitis   . CHF (congestive heart failure) (HCC)    diastolic dysfunction  . Chronic back pain   . Chronic headaches   . Complication of anesthesia    States she typically gets sick s/p anesthesia  . Contusion of sacrum   . COPD (chronic obstructive pulmonary disease) ([place])   . Dementia ([place])   . Depression   . Dyspnea    PFT 03/05/09 FEV1 2.77(98%), FVC 3.25(86%), FEV1% 85, TLC 5.88(99%), DLCO 60% ,  Methacholine challenge 03/16/09 normal ,  CT chest 03/12/09 no pulmonary disease  . Fungal infection   . GERD (gastroesophageal reflux disease)   . History of colonoscopy 10/17/2002   by Dr Rehman-> distal non-specific proctitis, small ext hemorrhoids,   . HTN (hypertension)   . Hyperlipidemia   . Hyperthyroidism    radioactive - thyroid cancer   . Migraine headache   . Morbid obesity with body mass index of 50.0-59.9 in adult [facility]) JAN 2011 370 LBS   2004 311 BMI 45.9  . Myocardial infarction ([place])    NOV 1997  . OSA on CPAP    she had been on 2L O2 at night but that was stopped  . PONV (postoperative nausea  and vomiting)   . PTSD (post-traumatic stress disorder)   . PTSD (post-traumatic stress disorder)   . Suicidal ideation   . Urine incontinence   . Vitamin D deficiency     Past Surgical History:  Procedure Laterality Date  . ABDOMINAL HYSTERECTOMY     sept 1996  . APPENDECTOMY    . BACK SURGERY  2008  . CARDIAC CATHETERIZATION     nov 1997  . CHOLECYSTECTOMY    . COLONOSCOPY  10/17/2002    Distal proctitis, small external hemorrhoids, otherwise/  normal colonoscopy. Suspect rectal bleeding secondary to hemorrhoids  . ESOPHAGOGASTRODUODENOSCOPY  03/18/09   fundic gland polyps/mild gastritis  . HERNIA REPAIR  1978  . JOINT REPLACEMENT     bil knee replacement  . KNEE ARTHROSCOPY    . LEFT HEART CATH AND CORONARY ANGIOGRAPHY N/A 08/08/2018   Procedure: LEFT HEART CATH AND CORONARY ANGIOGRAPHY;  Surgeon: Jettie Booze, MD;  Location: [place] [facility];  Service: Cardiovascular;  Laterality: N/A;  . MULTIPLE EXTRACTIONS WITH ALVEOLOPLASTY N/A 08/16/2015   Procedure: EXTRACTION OF TEETH THREE, SIX, EIGHT, NINE, ELEVEN, FOURTEEN, FIFTEEN, TWENTY-EIGHT WITH ALVEOLOPLASTY;  Surgeon: Diona Browner, DDS;  Location: [place];  Service: Oral Surgery;  Laterality: N/A;  . ROTATOR CUFF REPAIR Right   . TONSILLECTOMY    . TOTAL VAGINAL HYSTERECTOMY    . TUBAL LIGATION      There were no vitals filed for this visit.  Subjective Assessment - 05/05/19 1526    Subjective  no issues today.  Pt forgot her foam and has gotten most of her bandaging to begin her thighs.                       [facility] Adult PT Treatment/Exercise - 05/05/19 0001      Manual Therapy   Manual Therapy  Compression Bandaging;Manual Lymphatic Drainage (MLD);Other (comment)    Manual therapy comments  completed seperate from all other aspects of treatment.     Manual Lymphatic Drainage (MLD)  To include supraclavicular, deep and superfical abdominal, routing fluid using inguuinal axillary anastomosis and  LE.  Posterior aspect done sidelying.    Compression Bandaging  using foam and multilayer short stretch bandaging from toes  to knee B.                PT Short Term Goals - 04/14/19 1448      PT SHORT TERM GOAL #1   Title  PT to lose 3-5 cm volume from both LE to reduce risk of cellulitis    Time  4    Period  Weeks    Status  On-going    Target Date  05/09/19      PT SHORT TERM GOAL #2   Title  PT to know the sign and sx of cellulitis and the importance of getting immediate medical attention.    Time  4    Period  Weeks    Status  On-going        PT Long Term Goals - 04/14/19 1449      PT LONG TERM GOAL #1   Title  Pt to have lost between 5-8 cm of fluid to allow pt to be able to stand for 10 minutes in order to complete self grooming activites.    Time  8    Period  Weeks    Status  On-going      PT LONG TERM GOAL #2   Title  Pt LE pain to be no greater than 2/10  to be able to walk with her cane for ten minutes to allow pt to get to her medical appointments with greater ease.    Time  8    Period  Weeks    Status  On-going      PT LONG TERM GOAL #3   Title  PT to have obtained and be using a compression pump for maintenance phase of treatment.    Time  8    Period  Weeks    Status  On-going      PT LONG TERM GOAL #4   Title  PT to have thigh high compression garments and to be wearing daily.    Time  8    Period  Weeks    Status  On-going            Plan - 05/05/19 1731    Clinical Impression Statement  Daughter reports most of banaging have came in but waiting on the rest.  STates she will bring what she has next session.  Manual completed for bil LE's, moisturized and bandaged to  knee level. Pt forgot her foam pieces so the thigh pieces that were cut were utilzied in place of them.  Encouraged to find other shoes and given possible sources as she has been unsuccessful so far.    Personal Factors and Comorbidities  Comorbidity 3+    Comorbidities   B TKR, CAD, COPD, back surgery.    Examination-Activity Limitations  Lift;Locomotion Level;Stand;Transfers    Examination-Participation Restrictions  Community Activity;Cleaning;Laundry    Stability/Clinical Decision Making  Evolving/Moderate complexity    Rehab Potential  Good    PT Frequency  3x / week    PT Duration  8 weeks    PT Treatment/Interventions  Compression bandaging;Manual lymph drainage;Patient/family education;ADLs/Self Care Home Management    PT Next Visit Plan  Continue total decongestive techniques bandaging to knee level until pt brings in thigh bandages then begin thigh bandaging. Measure on Fridays.    PT Home Exercise Plan  ankle pumps, LAQ, hip ab/adduction, marching and diaphragmic breathing.       Patient will benefit from skilled therapeutic intervention in order to improve the following deficits and impairments:  Difficulty walking, Increased edema, Obesity, Pain  Visit Diagnosis: Lymphedema, not elsewhere classified  Pain in left leg     Problem List Patient Active Problem List   Diagnosis Date Noted  . Chronic acquired lymphedema 02/07/2019  . Bilateral shoulder pain 02/07/2019  . Memory loss 09/16/2018  . Chest pain 08/06/2018  . Prolonged QT interval 08/06/2018  . Chronic obstructive pulmonary disease ([place]) 05/06/2018  . Orthostatic hypotension 10/24/2017  . Fatty liver 10/24/2017  . Thrombocytopenia ([place]) 09/29/2017  . Visual hallucinations   . Auditory hallucination   . Acute encephalopathy 09/26/2017  . Lower urinary tract infectious disease 09/26/2017  . Bipolar I disorder, most recent episode depressed, severe w psychosis ([place]) 06/21/2016  . History of MI (myocardial infarction) 06/21/2016  . Spinal stenosis of lumbar region with neurogenic claudication 03/29/2016  . Severe obesity (BMI >= 40) ([place]) 07/05/2015  . Hyperlipidemia LDL goal <130 07/05/2015  . MDD (major depressive disorder), single episode, severe with psychotic features  ([place]) 01/21/2015  . 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 Bilateral knee pain 01/07/2014  . Scar condition and fibrosis of skin 12/11/2013  . Essential hypertension, benign 07/01/2013  . Asthma with acute exacerbation 11/04/2012  . OSA on CPAP 11/04/2012  . Back pain, chronic 08/27/2012  . Vitamin D insufficiency 04/04/2012  . Insomnia due to mental disorder 04/04/2012  . Anxiety 04/04/2012  . OCD (obsessive compulsive disorder) 04/04/2012  . PTSD (post-traumatic stress disorder) 06/28/2011  . Coronary artery disease 08/26/2010  . FATTY LIVER DISEASE 03/29/2009  . GERD 03/01/2009  . Morbid obesity ([place]) 02/15/2009  . Hypothyroidism 02/12/2009  . HYPERCHOLESTEROLEMIA 02/12/2009   Teena Irani, PTA/CLT [phone]  Teena Irani 05/05/2019, 5:34 PM  [facility] [address] [place], [place], [address] Phone: [phone]   Fax:  [phone]  Name: Allison Sharp MRN: [id number] Date of Birth: 1956/04/16

## 2019-05-07 ENCOUNTER — Ambulatory Visit (HOSPITAL_COMMUNITY): Payer: Medicare Other | Admitting: Physical Therapy

## 2019-05-07 ENCOUNTER — Other Ambulatory Visit: Payer: Self-pay | Admitting: Family Medicine

## 2019-05-07 ENCOUNTER — Other Ambulatory Visit: Payer: Self-pay

## 2019-05-07 DIAGNOSIS — M79605 Pain in left leg: Secondary | ICD-10-CM

## 2019-05-07 DIAGNOSIS — I890 Lymphedema, not elsewhere classified: Secondary | ICD-10-CM | POA: Diagnosis not present

## 2019-05-07 DIAGNOSIS — M79604 Pain in right leg: Secondary | ICD-10-CM

## 2019-05-07 DIAGNOSIS — R262 Difficulty in walking, not elsewhere classified: Secondary | ICD-10-CM

## 2019-05-07 NOTE — Therapy (Signed)
[place], [place], [address] Phone: [phone]   Fax:  [phone]  Physical Therapy Treatment  Patient Details  Name: Allison Sharp MRN: [id number] Date of Birth: [birth year] Referring Provider (PT): Ronnald Collum    Encounter Date: 05/07/2019  PT End of Session - 05/07/19 1712    Visit Number  9    Number of Visits  24    Date for PT Re-Evaluation  06/06/19    Authorization - Visit Number  9    Authorization - Number of Visits  10    PT Start Time  [id number]    PT Stop Time  1710    PT Time Calculation (min)  90 min    Activity Tolerance  Patient tolerated treatment well    Behavior During Therapy  [facility] for tasks assessed/performed       Past Medical History:  Diagnosis Date  . Allergy   . Anxiety   . Arthritis   . Asthma   . Bipolar 1 disorder ([place])   . CAD (coronary artery disease)   . Cellulitis   . CHF (congestive heart failure) (HCC)    diastolic dysfunction  . Chronic back pain   . Chronic headaches   . Complication of anesthesia    States she typically gets sick s/p anesthesia  . Contusion of sacrum   . COPD (chronic obstructive pulmonary disease) ([place])   . Dementia ([place])   . Depression   . Dyspnea    PFT 03/05/09 FEV1 2.77(98%), FVC 3.25(86%), FEV1% 85, TLC 5.88(99%), DLCO 60% ,  Methacholine challenge 03/16/09 normal ,  CT chest 03/12/09 no pulmonary disease  . Fungal infection   . GERD (gastroesophageal reflux disease)   . History of colonoscopy 10/17/2002   by Dr Rehman-> distal non-specific proctitis, small ext hemorrhoids,   . HTN (hypertension)   . Hyperlipidemia   . Hyperthyroidism    radioactive - thyroid cancer   . Migraine headache   . Morbid obesity with body mass index of 50.0-59.9 in adult [facility]) JAN 2011 370 LBS   2004 311 BMI 45.9  . Myocardial infarction ([place])    NOV 1997  . OSA on CPAP    she had been on 2L O2 at night but that was stopped  . PONV (postoperative nausea  and vomiting)   . PTSD (post-traumatic stress disorder)   . PTSD (post-traumatic stress disorder)   . Suicidal ideation   . Urine incontinence   . Vitamin D deficiency     Past Surgical History:  Procedure Laterality Date  . ABDOMINAL HYSTERECTOMY     sept 1996  . APPENDECTOMY    . BACK SURGERY  2008  . CARDIAC CATHETERIZATION     nov 1997  . CHOLECYSTECTOMY    . COLONOSCOPY  10/17/2002    Distal proctitis, small external hemorrhoids, otherwise/  normal colonoscopy. Suspect rectal bleeding secondary to hemorrhoids  . ESOPHAGOGASTRODUODENOSCOPY  03/18/09   fundic gland polyps/mild gastritis  . HERNIA REPAIR  1978  . JOINT REPLACEMENT     bil knee replacement  . KNEE ARTHROSCOPY    . LEFT HEART CATH AND CORONARY ANGIOGRAPHY N/A 08/08/2018   Procedure: LEFT HEART CATH AND CORONARY ANGIOGRAPHY;  Surgeon: Jettie Booze, MD;  Location: [place] [facility];  Service: Cardiovascular;  Laterality: N/A;  . MULTIPLE EXTRACTIONS WITH ALVEOLOPLASTY N/A 08/16/2015   Procedure: EXTRACTION OF TEETH THREE, SIX, EIGHT, NINE, ELEVEN, FOURTEEN, FIFTEEN, TWENTY-EIGHT WITH ALVEOLOPLASTY;  Surgeon: Diona Browner, DDS;  Location: [place];  Service: Oral Surgery;  Laterality: N/A;  . ROTATOR CUFF REPAIR Right   . TONSILLECTOMY    . TOTAL VAGINAL HYSTERECTOMY    . TUBAL LIGATION      There were no vitals filed for this visit.  Subjective Assessment - 05/07/19 1711    Subjective  Pt returns today with remaining bandages she ordered for her thighs.    Currently in Pain?  No/denies                       [facility] Adult PT Treatment/Exercise - 05/07/19 0001      Manual Therapy   Manual Therapy  Compression Bandaging;Manual Lymphatic Drainage (MLD);Other (comment)    Manual therapy comments  completed seperate from all other aspects of treatment.     Manual Lymphatic Drainage (MLD)  To include supraclavicular, deep and superfical abdominal, routing fluid using inguuinal axillary  anastomosis and LE.  Posterior aspect done sidelying.    Compression Bandaging  using foam and multilayer short stretch bandaging from toes  to knee B.                PT Short Term Goals - 04/14/19 1448      PT SHORT TERM GOAL #1   Title  PT to lose 3-5 cm volume from both LE to reduce risk of cellulitis    Time  4    Period  Weeks    Status  On-going    Target Date  05/09/19      PT SHORT TERM GOAL #2   Title  PT to know the sign and sx of cellulitis and the importance of getting immediate medical attention.    Time  4    Period  Weeks    Status  On-going        PT Long Term Goals - 04/14/19 1449      PT LONG TERM GOAL #1   Title  Pt to have lost between 5-8 cm of fluid to allow pt to be able to stand for 10 minutes in order to complete self grooming activites.    Time  8    Period  Weeks    Status  On-going      PT LONG TERM GOAL #2   Title  Pt LE pain to be no greater than 2/10  to be able to walk with her cane for ten minutes to allow pt to get to her medical appointments with greater ease.    Time  8    Period  Weeks    Status  On-going      PT LONG TERM GOAL #3   Title  PT to have obtained and be using a compression pump for maintenance phase of treatment.    Time  8    Period  Weeks    Status  On-going      PT LONG TERM GOAL #4   Title  PT to have thigh high compression garments and to be wearing daily.    Time  8    Period  Weeks    Status  On-going            Plan - 05/07/19 1713    Clinical Impression Statement  Manual completed to bilateral LE's f/b bandaging full LE's bilaterally.  Used K1 netting for thighs due to large circumference.  Pt reported overall comfort and educated to remove layer by layer if  becomes uncomfortable.  Pt without any difficulty ambulating using walker at end of session or transferring.    Personal Factors and Comorbidities  Comorbidity 3+    Comorbidities  B TKR, CAD, COPD, back surgery.    Examination-Activity  Limitations  Lift;Locomotion Level;Stand;Transfers    Examination-Participation Restrictions  Community Activity;Cleaning;Laundry    Stability/Clinical Decision Making  Evolving/Moderate complexity    Rehab Potential  Good    PT Frequency  3x / week    PT Duration  8 weeks    PT Treatment/Interventions  Compression bandaging;Manual lymph drainage;Patient/family education;ADLs/Self Care Home Management    PT Next Visit Plan  Continue total decongestive techniques and  bandaging to bilateral LE's. Measure on Fridays. Complete 10th visit PN next session.    PT Home Exercise Plan  ankle pumps, LAQ, hip ab/adduction, marching and diaphragmic breathing.       Patient will benefit from skilled therapeutic intervention in order to improve the following deficits and impairments:  Difficulty walking, Increased edema, Obesity, Pain  Visit Diagnosis: Pain in left leg  Pain in right leg  Difficulty in walking, not elsewhere classified  Lymphedema, not elsewhere classified     Problem List Patient Active Problem List   Diagnosis Date Noted  . Chronic acquired lymphedema 02/07/2019  . Bilateral shoulder pain 02/07/2019  . Memory loss 09/16/2018  . Chest pain 08/06/2018  . Prolonged QT interval 08/06/2018  . Chronic obstructive pulmonary disease ([place]) 05/06/2018  . Orthostatic hypotension 10/24/2017  . Fatty liver 10/24/2017  . Thrombocytopenia ([place]) 09/29/2017  . Visual hallucinations   . Auditory hallucination   . Acute encephalopathy 09/26/2017  . Lower urinary tract infectious disease 09/26/2017  . Bipolar I disorder, most recent episode depressed, severe w psychosis ([place]) 06/21/2016  . History of MI (myocardial infarction) 06/21/2016  . Spinal stenosis of lumbar region with neurogenic claudication 03/29/2016  . Severe obesity (BMI >= 40) ([place]) 07/05/2015  . Hyperlipidemia LDL goal <130 07/05/2015  . MDD (major depressive disorder), single episode, severe with psychotic features  ([place]) 01/21/2015  . 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 Bilateral knee pain 01/07/2014  . Scar condition and fibrosis of skin 12/11/2013  . Essential hypertension, benign 07/01/2013  . Asthma with acute exacerbation 11/04/2012  . OSA on CPAP 11/04/2012  . Back pain, chronic 08/27/2012  . Vitamin D insufficiency 04/04/2012  . Insomnia due to mental disorder 04/04/2012  . Anxiety 04/04/2012  . OCD (obsessive compulsive disorder) 04/04/2012  . PTSD (post-traumatic stress disorder) 06/28/2011  . Coronary artery disease 08/26/2010  . FATTY LIVER DISEASE 03/29/2009  . GERD 03/01/2009  . Morbid obesity ([place]) 02/15/2009  . Hypothyroidism 02/12/2009  . HYPERCHOLESTEROLEMIA 02/12/2009   Teena Irani, PTA/CLT [phone]  Teena Irani 05/07/2019, 5:15 PM  [place], [place], [address] Phone: [phone]   Fax:  [phone]  Name: Allison Sharp MRN: [id number] Date of Birth: Oct 07, 1955

## 2019-05-09 ENCOUNTER — Telehealth: Payer: Self-pay | Admitting: Family Medicine

## 2019-05-09 ENCOUNTER — Other Ambulatory Visit: Payer: Self-pay

## 2019-05-09 ENCOUNTER — Encounter (HOSPITAL_COMMUNITY): Payer: Self-pay | Admitting: Physical Therapy

## 2019-05-09 ENCOUNTER — Ambulatory Visit (HOSPITAL_COMMUNITY): Payer: Medicare Other | Admitting: Physical Therapy

## 2019-05-09 DIAGNOSIS — M79605 Pain in left leg: Secondary | ICD-10-CM

## 2019-05-09 DIAGNOSIS — I890 Lymphedema, not elsewhere classified: Secondary | ICD-10-CM | POA: Diagnosis not present

## 2019-05-09 DIAGNOSIS — M79604 Pain in right leg: Secondary | ICD-10-CM

## 2019-05-09 DIAGNOSIS — R262 Difficulty in walking, not elsewhere classified: Secondary | ICD-10-CM

## 2019-05-09 NOTE — Therapy (Signed)
[place] [address] [place], [place], [address] Phone: [phone]   Fax:  [phone]  Physical Therapy Treatment  Patient Details  Name: Allison Sharp MRN: [id number] Date of Birth: 1955-09-06 Referring Provider (PT): Ronnald Collum   Progress Note Reporting Period 11/13/2020to 05/09/2019  See note below for Objective Data and Assessment of Progress/Goals.      Encounter Date: 05/09/2019  PT End of Session - 05/09/19 1315    Visit Number  10    Number of Visits  24    Date for PT Re-Evaluation  06/06/19    Authorization - Visit Number  1    Authorization - Number of Visits  10    PT Start Time  1055    PT Stop Time  1240    PT Time Calculation (min)  105 min    Activity Tolerance  Patient tolerated treatment well    Behavior During Therapy  [facility] for tasks assessed/performed       Past Medical History:  Diagnosis Date  . Allergy   . Anxiety   . Arthritis   . Asthma   . Bipolar 1 disorder ([place])   . CAD (coronary artery disease)   . Cellulitis   . CHF (congestive heart failure) (HCC)    diastolic dysfunction  . Chronic back pain   . Chronic headaches   . Complication of anesthesia    States she typically gets sick s/p anesthesia  . Contusion of sacrum   . COPD (chronic obstructive pulmonary disease) ([place])   . Dementia ([place])   . Depression   . Dyspnea    PFT 03/05/09 FEV1 2.77(98%), FVC 3.25(86%), FEV1% 85, TLC 5.88(99%), DLCO 60% ,  Methacholine challenge 03/16/09 normal ,  CT chest 03/12/09 no pulmonary disease  . Fungal infection   . GERD (gastroesophageal reflux disease)   . History of colonoscopy 10/17/2002   by Dr Rehman-> distal non-specific proctitis, small ext hemorrhoids,   . HTN (hypertension)   . Hyperlipidemia   . Hyperthyroidism    radioactive - thyroid cancer   . Migraine headache   . Morbid obesity with body mass index of 50.0-59.9 in adult [facility]) JAN 2011 370 LBS   2004 311 BMI 45.9  .  Myocardial infarction ([place])    NOV 1997  . OSA on CPAP    she had been on 2L O2 at night but that was stopped  . PONV (postoperative nausea and vomiting)   . PTSD (post-traumatic stress disorder)   . PTSD (post-traumatic stress disorder)   . Suicidal ideation   . Urine incontinence   . Vitamin D deficiency     Past Surgical History:  Procedure Laterality Date  . ABDOMINAL HYSTERECTOMY     sept 1996  . APPENDECTOMY    . BACK SURGERY  2008  . CARDIAC CATHETERIZATION     nov 1997  . CHOLECYSTECTOMY    . COLONOSCOPY  10/17/2002    Distal proctitis, small external hemorrhoids, otherwise/  normal colonoscopy. Suspect rectal bleeding secondary to hemorrhoids  . ESOPHAGOGASTRODUODENOSCOPY  03/18/09   fundic gland polyps/mild gastritis  . HERNIA REPAIR  1978  . JOINT REPLACEMENT     bil knee replacement  . KNEE ARTHROSCOPY    . LEFT HEART CATH AND CORONARY ANGIOGRAPHY N/A 08/08/2018   Procedure: LEFT HEART CATH AND CORONARY ANGIOGRAPHY;  Surgeon: Jettie Booze, MD;  Location: [facility];  Service: Cardiovascular;  Laterality: N/A;  . MULTIPLE EXTRACTIONS  WITH ALVEOLOPLASTY N/A 08/16/2015   Procedure: EXTRACTION OF TEETH THREE, SIX, EIGHT, NINE, ELEVEN, FOURTEEN, FIFTEEN, TWENTY-EIGHT WITH ALVEOLOPLASTY;  Surgeon: Diona Browner, DDS;  Location: [facility];  Service: Oral Surgery;  Laterality: N/A;  . ROTATOR CUFF REPAIR Right   . TONSILLECTOMY    . TOTAL VAGINAL HYSTERECTOMY    . TUBAL LIGATION      There were no vitals filed for this visit.  Subjective Assessment - 05/09/19 1313    Subjective  PT states that she fell Wed. night.  States that she has started to urinate    Pertinent History  CAD , COPD    How long can you stand comfortably?  2 minutes    How long can you walk comfortably?  177ft uses a cane and O2    Patient Stated Goals  decreaed edema, to be able to walk and stand longer    Currently in Pain?  Yes    Pain Score  4     Pain Location  Leg    Pain  Orientation  Right;Left    Pain Descriptors / Indicators  Throbbing    Pain Type  Chronic pain    Pain Onset  More than a month ago            LYMPHEDEMA/ONCOLOGY QUESTIONNAIRE - 05/09/19 1101      Right Lower Extremity Lymphedema   20 cm Proximal to Suprapatella  95 cm   Was 94.3   10 cm Proximal to Suprapatella  91 cm   90.7   At Midpatella/Popliteal Crease  66 cm   was 68.8   30 cm Proximal to Floor at Lateral Plantar Foot  65 cm   68.3   20 cm Proximal to Floor at Lateral Plantar Foot  56.5 1   51   10 cm Proximal to Floor at Lateral Malleoli  39 cm   51   Circumference of ankle/heel  37.5 cm.   37.7   5 cm Proximal to 1st MTP Joint  27.3 cm   28   Across MTP Joint  27.2 cm   27     Left Lower Extremity Lymphedema   20 cm Proximal to Suprapatella  93 cm    10 cm Proximal to Suprapatella  85 cm   was 87.8   At Midpatella/Popliteal Crease  65 cm   64.8   30 cm Proximal to Floor at Lateral Plantar Foot  64 cm    20 cm Proximal to Floor at Lateral Plantar Foot  56 cm   58.3   10 cm Proximal to Floor at Lateral Malleoli  43 cm   49   Circumference of ankle/heel  39 cm.   40.3   5 cm Proximal to 1st MTP Joint  27.4 cm   28.2   Across MTP Joint  27 cm   was 26.3               [facility] Adult PT Treatment/Exercise - 05/09/19 0001      Manual Therapy   Manual Therapy  Compression Bandaging;Manual Lymphatic Drainage (MLD);Other (comment)    Manual therapy comments  completed seperate from all other aspects of treatment.     Manual Lymphatic Drainage (MLD)  To include supraclavicular, deep and superfical abdominal, routing fluid using inguuinal axillary anastomosis and LE.  Posterior aspect done sidelying.    Compression Bandaging  using foam and multilayer short stretch bandaging from toes  to thigh B.     Other Manual  Therapy  measurement               PT Short Term Goals - 04/14/19 1448      PT SHORT TERM GOAL #1   Title  PT to lose 3-5 cm  volume from both LE to reduce risk of cellulitis    Time  4    Period  Weeks    Status  On-going    Target Date  05/09/19      PT SHORT TERM GOAL #2   Title  PT to know the sign and sx of cellulitis and the importance of getting immediate medical attention.    Time  4    Period  Weeks    Status  On-going        PT Long Term Goals - 04/14/19 1449      PT LONG TERM GOAL #1   Title  Pt to have lost between 5-8 cm of fluid to allow pt to be able to stand for 10 minutes in order to complete self grooming activites.    Time  8    Period  Weeks    Status  On-going      PT LONG TERM GOAL #2   Title  Pt LE pain to be no greater than 2/10  to be able to walk with her cane for ten minutes to allow pt to get to her medical appointments with greater ease.    Time  8    Period  Weeks    Status  On-going      PT LONG TERM GOAL #3   Title  PT to have obtained and be using a compression pump for maintenance phase of treatment.    Time  8    Period  Weeks    Status  On-going      PT LONG TERM GOAL #4   Title  PT to have thigh high compression garments and to be wearing daily.    Time  8    Period  Weeks    Status  On-going            Plan - 05/09/19 1315    Clinical Impression Statement  Pt measured with noted increased volumes B.  Therapist and pt consulted to see if we there was a reason why as pt had been decongesting well.  Nothing besides the fall was different but pt states that her legs have been sore since the fall.  Overall volumes are still lower than inital eval but significantly higher than last week.    Personal Factors and Comorbidities  Comorbidity 3+    Comorbidities  B TKR, CAD, COPD, back surgery.    Examination-Activity Limitations  Lift;Locomotion Level;Stand;Transfers    Examination-Participation Restrictions  Community Activity;Cleaning;Laundry    Stability/Clinical Decision Making  Evolving/Moderate complexity    Rehab Potential  Good    PT Frequency  3x /  week    PT Duration  8 weeks    PT Treatment/Interventions  Compression bandaging;Manual lymph drainage;Patient/family education;ADLs/Self Care Home Management    PT Next Visit Plan  Continue total decongestive techniques and  bandaging to bilateral LE's. Measure on Fridays.    PT Home Exercise Plan  ankle pumps, LAQ, hip ab/adduction, marching and diaphragmic breathing.       Patient will benefit from skilled therapeutic intervention in order to improve the following deficits and impairments:  Difficulty walking, Increased edema, Obesity, Pain  Visit Diagnosis: Pain in left leg  Pain  in right leg  Difficulty in walking, not elsewhere classified  Lymphedema, not elsewhere classified     Problem List Patient Active Problem List   Diagnosis Date Noted  . Chronic acquired lymphedema 02/07/2019  . Bilateral shoulder pain 02/07/2019  . Memory loss 09/16/2018  . Chest pain 08/06/2018  . Prolonged QT interval 08/06/2018  . Chronic obstructive pulmonary disease ([place]) 05/06/2018  . Orthostatic hypotension 10/24/2017  . Fatty liver 10/24/2017  . Thrombocytopenia ([place]) 09/29/2017  . Visual hallucinations   . Auditory hallucination   . Acute encephalopathy 09/26/2017  . Lower urinary tract infectious disease 09/26/2017  . Bipolar I disorder, most recent episode depressed, severe w psychosis ([place]) 06/21/2016  . History of MI (myocardial infarction) 06/21/2016  . Spinal stenosis of lumbar region with neurogenic claudication 03/29/2016  . Severe obesity (BMI >= 40) ([place]) 07/05/2015  . Hyperlipidemia LDL goal <130 07/05/2015  . MDD (major depressive disorder), single episode, severe with psychotic features ([place]) 01/21/2015  . 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 Bilateral knee pain 01/07/2014  . Scar condition and fibrosis of skin 12/11/2013  . Essential hypertension, benign 07/01/2013  . Asthma with acute exacerbation 11/04/2012  . OSA on CPAP 11/04/2012  . Back pain, chronic  08/27/2012  . Vitamin D insufficiency 04/04/2012  . Insomnia due to mental disorder 04/04/2012  . Anxiety 04/04/2012  . OCD (obsessive compulsive disorder) 04/04/2012  . PTSD (post-traumatic stress disorder) 06/28/2011  . Coronary artery disease 08/26/2010  . FATTY LIVER DISEASE 03/29/2009  . GERD 03/01/2009  . Morbid obesity ([place]) 02/15/2009  . Hypothyroidism 02/12/2009  . HYPERCHOLESTEROLEMIA 02/12/2009   [name], PT CLT [phone] 05/09/2019, 1:18 PM  [facility] [address] [place], [place], [address] Phone: [phone]   Fax:  [phone]  Name: Allison Sharp MRN: [id number] Date of Birth: 1955-09-20

## 2019-05-12 ENCOUNTER — Encounter: Payer: Self-pay | Admitting: Family Medicine

## 2019-05-12 ENCOUNTER — Ambulatory Visit (INDEPENDENT_AMBULATORY_CARE_PROVIDER_SITE_OTHER): Payer: Medicare Other | Admitting: Family Medicine

## 2019-05-12 ENCOUNTER — Other Ambulatory Visit: Payer: Self-pay

## 2019-05-12 VITALS — BP 149/93 | HR 109 | Temp 94.8°F | Ht 69.0 in | Wt 399.8 lb

## 2019-05-12 DIAGNOSIS — Z23 Encounter for immunization: Secondary | ICD-10-CM | POA: Diagnosis not present

## 2019-05-12 DIAGNOSIS — I10 Essential (primary) hypertension: Secondary | ICD-10-CM

## 2019-05-12 DIAGNOSIS — E785 Hyperlipidemia, unspecified: Secondary | ICD-10-CM

## 2019-05-12 DIAGNOSIS — E039 Hypothyroidism, unspecified: Secondary | ICD-10-CM | POA: Diagnosis not present

## 2019-05-12 DIAGNOSIS — L0291 Cutaneous abscess, unspecified: Secondary | ICD-10-CM

## 2019-05-12 DIAGNOSIS — Z1211 Encounter for screening for malignant neoplasm of colon: Secondary | ICD-10-CM

## 2019-05-12 DIAGNOSIS — F323 Major depressive disorder, single episode, severe with psychotic features: Secondary | ICD-10-CM

## 2019-05-12 MED ORDER — SULFAMETHOXAZOLE-TRIMETHOPRIM 800-160 MG PO TABS: 1.0000 | ORAL_TABLET | Freq: Two times a day (BID) | ORAL | 0 refills | Status: DC

## 2019-05-12 MED ORDER — FLUCONAZOLE 150 MG PO TABS: 150.0000 mg | ORAL_TABLET | Freq: Once | ORAL | 0 refills | Status: AC

## 2019-05-12 NOTE — Progress Notes (Signed)
BP (!) 149/93   Pulse (!) 109   Temp (!) 94.8 F (34.9 C) (Temporal)   Ht '5\' 9"'  (1.753 m)   Wt (!) 399 lb 12.8 oz (181.3 kg)   LMP 02/18/1995   SpO2 90%   BMI 59.04 kg/m    Subjective:   Patient ID: Allison Sharp, female    DOB: 01-09-1956, 63 y.o.   MRN: [id number]  HPI: Allison Sharp is a 63 y.o. female presenting on 05/12/2019 for Hypothyroidism (3 month follow up), abcess on neck (x 1 week), and Rx for gel matress   HPI Hypothyroidism recheck Patient is coming in for thyroid recheck today as well. They deny any issues with hair changes or heat or cold problems or diarrhea or constipation. They deny any chest pain or palpitations. They are currently on levothyroxine 162mcrograms   Hyperlipidemia Patient is coming in for recheck of his hyperlipidemia. The patient is currently taking atorvastatin. They deny any issues with myalgias or history of liver damage from it. They deny any focal numbness or weakness or chest pain.   Hypertension Patient is currently on clonidine, and their blood pressure today is 149/93 although she does admit that she just barely took her medicine before coming in she has challenges with walking because of her breathing.. Patient denies any lightheadedness or dizziness. Patient denies headaches, blurred vision, chest pains, shortness of breath, or weakness. Denies any side effects from medication and is content with current medication.   Patient has developed an abscess on her back is been there at least a couple weeks per her caretaker.  She says it has been draining and she has been using topical antibiotic but it still just seems to have more in there.  Relevant past medical, surgical, family and social history reviewed and updated as indicated. Interim medical history since our last visit reviewed. Allergies and medications reviewed and updated.  Review of Systems  Constitutional: Negative for chills and fever.  Eyes: Negative for redness and  visual disturbance.  Respiratory: Negative for chest tightness and shortness of breath.   Cardiovascular: Negative for chest pain and leg swelling.  Musculoskeletal: Negative for back pain and gait problem.  Skin: Positive for color change and wound. Negative for rash.  Neurological: Negative for light-headedness and headaches.  Psychiatric/Behavioral: Negative for agitation and behavioral problems.  All other systems reviewed and are negative.   Per HPI unless specifically indicated above   Allergies as of 05/12/2019      Reactions   Abilify [aripiprazole] Other (See Comments)   hallucinations   Naproxen Nausea And Vomiting, Swelling   Ativan [lorazepam] Other (See Comments)   Delirium   Haldol [haloperidol] Other (See Comments)   Hallucinating    Hydroxyzine Other (See Comments)   hallucinations      Medication List       Accurate as of May 12, 2019 11:49 AM. If you have any questions, ask your nurse or doctor.        albuterol (2.5 MG/3ML) 0.083% nebulizer solution Commonly known as: PROVENTIL Take 3 mLs (2.5 mg total) by nebulization every 4 (four) hours as needed for wheezing or shortness of breath.   albuterol 108 (90 Base) MCG/ACT inhaler Commonly known as: ProAir HFA USE 2 PUFF EVERY 4 HOURS AS NEEDED FOR WHEEZING OR SHORTNESS OF BREATH   atorvastatin 20 MG tablet Commonly known as: LIPITOR TAKE 1 TABLET DAILY   benztropine 1 MG tablet Commonly known as: COGENTIN Take 1 tablet (1  mg total) by mouth at bedtime.   Breo Ellipta 100-25 MCG/INH Aepb Generic drug: fluticasone furoate-vilanterol INHALE 1 PUFF INTO THE LUNGS DAILY   celecoxib 200 MG capsule Commonly known as: CELEBREX TAKE (1) CAPSULE TWICE DAILY.   cetirizine 10 MG tablet Commonly known as: ZYRTEC TAKE 1 TABLET ONCE DAILY   cloNIDine 0.1 MG tablet Commonly known as: CATAPRES TAKE ONE TABLET AT BEDTIME   diclofenac sodium 1 % Gel Commonly known as: Voltaren Apply to affected  area twice daily as needed What changed:   how much to take  how to take this  when to take this  reasons to take this  additional instructions   divalproex 500 MG DR tablet Commonly known as: DEPAKOTE Take 3 tablets (1,500 mg total) by mouth at bedtime.   DULoxetine 60 MG capsule Commonly known as: CYMBALTA Take 1 capsule (60 mg total) by mouth daily.   fluconazole 150 MG tablet Commonly known as: Diflucan Take 1 tablet (150 mg total) by mouth once for 1 dose. Started by: Fransisca Kaufmann Makyla Bye, MD   fluticasone 50 MCG/ACT nasal spray Commonly known as: FLONASE Place 1 spray 2 (two) times daily as needed into both nostrils for allergies or rhinitis.   levothyroxine 112 MCG tablet Commonly known as: SYNTHROID TAKE 1 TABLET DAILY   nystatin cream Commonly known as: MYCOSTATIN APPLY TO THE AFFECTED AREA THREE TIMES DAILY AS NEEDED .Marland KitchenFOR TOPICAL USE ONLY... What changed: See the new instructions.   omeprazole 20 MG capsule Commonly known as: PRILOSEC TAKE (1) CAPSULE DAILY   ondansetron 4 MG disintegrating tablet Commonly known as: ZOFRAN-ODT DISSOLVE 1 TABLET IN MOUTH EVERY 8 HOURS AS NEEDED FOR NAUSEA AND VOMITING   polyethylene glycol powder 17 GM/SCOOP powder Commonly known as: GLYCOLAX/MIRALAX Take 17 g by mouth 2 (two) times daily as needed. What changed: reasons to take this   promethazine 25 MG tablet Commonly known as: PHENERGAN Take 1 tablet (25 mg total) by mouth every 8 (eight) hours as needed for nausea or vomiting.   risperiDONE 2 MG tablet Commonly known as: RisperDAL Take 1 tablet (2 mg total) by mouth 2 (two) times daily. For auditory hallucintations   sulfamethoxazole-trimethoprim 800-160 MG tablet Commonly known as: BACTRIM DS Take 1 tablet by mouth 2 (two) times daily. Started by: Worthy Rancher, MD   therapeutic multivitamin-minerals tablet Take 1 tablet by mouth daily.   traMADol 50 MG tablet Commonly known as: ULTRAM Take 1  tablet (50 mg total) by mouth every 8 (eight) hours as needed for moderate pain.   traZODone 50 MG tablet Commonly known as: DESYREL Take 1-2 tablets (50-100 mg total) by mouth at bedtime.   Vitamin D (Ergocalciferol) 1.25 MG (50000 UT) Caps capsule Commonly known as: DRISDOL TAKE 1 CAPSULE EVERY 7 SEVEN DAYS        Objective:   BP (!) 149/93   Pulse (!) 109   Temp (!) 94.8 F (34.9 C) (Temporal)   Ht '5\' 9"'  (1.753 m)   Wt (!) 399 lb 12.8 oz (181.3 kg)   LMP 02/18/1995   SpO2 90%   BMI 59.04 kg/m   Wt Readings from Last 3 Encounters:  05/12/19 (!) 399 lb 12.8 oz (181.3 kg)  02/07/19 (!) 396 lb (179.6 kg)  10/25/18 (!) 393 lb 12.8 oz (178.6 kg)    Physical Exam Vitals and nursing note reviewed.  Constitutional:      General: She is not in acute distress.    Appearance: She is  well-developed. She is not diaphoretic.     Comments: Patient on 3 L nasal cannula  Eyes:     Conjunctiva/sclera: Conjunctivae normal.  Cardiovascular:     Rate and Rhythm: Normal rate and regular rhythm.     Heart sounds: Normal heart sounds. No murmur.  Pulmonary:     Effort: Pulmonary effort is normal. No respiratory distress.     Breath sounds: Normal breath sounds. No wheezing.  Musculoskeletal:        General: No tenderness. Normal range of motion.  Skin:    General: Skin is warm and dry.     Findings: No rash.       Neurological:     Mental Status: She is alert and oriented to person, place, and time.     Coordination: Coordination normal.  Psychiatric:        Behavior: Behavior normal.       Assessment & Plan:   Problem List Items Addressed This Visit      Cardiovascular and Mediastinum   Essential hypertension, benign (Chronic)   Relevant Orders   CBC with Differential/Platelet   CMP14+EGFR     Endocrine   Hypothyroidism - Primary (Chronic)   Relevant Orders   CBC with Differential/Platelet   CMP14+EGFR   TSH     Other   MDD (major depressive disorder),  single episode, severe with psychotic features ([place])   Relevant Orders   Valproic acid level   Hyperlipidemia LDL goal <130   Relevant Orders   Lipid panel    Other Visit Diagnoses    Colon cancer screening       Relevant Orders   Ambulatory referral to Gastroenterology   Abscess       Relevant Medications   sulfamethoxazole-trimethoprim (BACTRIM DS) 800-160 MG tablet   fluconazole (DIFLUCAN) 150 MG tablet   Other Relevant Orders   wound culture    Patient wanted Depakote levels checked, we will check them but we will send it to her psychiatrist for management.  We will treat abscess with Bactrim and run wound culture, if worsens return.  No changes in current medication, will check blood work.  Follow up plan: Return in about 3 months (around 08/10/2019), or if symptoms worsen or fail to improve, for Recheck thyroid.  Counseling provided for all of the vaccine components Orders Placed This Encounter  Procedures  . wound culture  . Pneumococcal polysaccharide vaccine 23-valent greater than or equal to 2yo subcutaneous/IM  . CBC with Differential/Platelet  . CMP14+EGFR  . TSH  . Lipid panel  . Valproic acid level  . Ambulatory referral to Gastroenterology    [name], MD [facility] 05/12/2019, 11:49 AM

## 2019-05-13 ENCOUNTER — Encounter (HOSPITAL_COMMUNITY): Payer: Self-pay | Admitting: Physical Therapy

## 2019-05-13 ENCOUNTER — Ambulatory Visit (HOSPITAL_COMMUNITY): Payer: Medicare Other | Admitting: Physical Therapy

## 2019-05-13 DIAGNOSIS — M79605 Pain in left leg: Secondary | ICD-10-CM

## 2019-05-13 DIAGNOSIS — I890 Lymphedema, not elsewhere classified: Secondary | ICD-10-CM

## 2019-05-13 DIAGNOSIS — R262 Difficulty in walking, not elsewhere classified: Secondary | ICD-10-CM

## 2019-05-13 DIAGNOSIS — M79604 Pain in right leg: Secondary | ICD-10-CM

## 2019-05-13 NOTE — Therapy (Signed)
[facility] [place], [place], [address] Phone: [phone]   Fax:  [phone]  Physical Therapy Treatment  Patient Details  Name: Allison Sharp MRN: [id number] Date of Birth: 06-02-1955 Referring Provider (PT): Allison Sharp    Encounter Date: 05/13/2019  PT End of Session - 05/13/19 1627    Visit Number  11    Number of Visits  24    Date for PT Re-Evaluation  06/06/19    Authorization - Visit Number  2    Authorization - Number of Visits  10    PT Start Time  [id number]    PT Stop Time  1620    PT Time Calculation (min)  95 min    Activity Tolerance  Patient tolerated treatment well    Behavior During Therapy  [facility] for tasks assessed/performed       Past Medical History:  Diagnosis Date  . Allergy   . Anxiety   . Arthritis   . Asthma   . Bipolar 1 disorder ([place])   . CAD (coronary artery disease)   . Cellulitis   . CHF (congestive heart failure) (H[facility])    diastolic dysfunction  . Chronic back pain   . Chronic headaches   . Complication of anesthesia    States she typically gets sick s/p anesthesia  . Contusion of sacrum   . COPD (chronic obstructive pulmonary disease) ([place])   . Dementia ([place])   . Depression   . Dyspnea    PFT 03/05/09 FEV1 2.77(98%), FVC 3.25(86%), FEV1% 85, TLC 5.88(99%), DLCO 60% ,  Methacholine challenge 03/16/09 normal ,  CT chest 03/12/09 no pulmonary disease  . Fungal infection   . GERD (gastroesophageal reflux disease)   . History of colonoscopy 10/17/2002   by Dr Rehman-> distal non-specific proctitis, small ext hemorrhoids,   . HTN (hypertension)   . Hyperlipidemia   . Hyperthyroidism    radioactive - thyroid cancer   . Migraine headache   . Morbid obesity with body mass index of 50.0-59.9 in adult [facility]) JAN 2011 370 LBS   2004 311 BMI 45.9  . Myocardial infarction ([place])    NOV 1997  . OSA on CPAP    she had been on 2L O2 at night but that was stopped  . PONV (postoperative  nausea and vomiting)   . PTSD (post-traumatic stress disorder)   . PTSD (post-traumatic stress disorder)   . Suicidal ideation   . Urine incontinence   . Vitamin D deficiency     Past Surgical History:  Procedure Laterality Date  . ABDOMINAL HYSTERECTOMY     sept 1996  . APPENDECTOMY    . BACK SURGERY  2008  . CARDIAC CATHETERIZATION     nov 1997  . CHOLECYSTECTOMY    . COLONOSCOPY  10/17/2002    Distal proctitis, small external hemorrhoids, otherwise/  normal colonoscopy. Suspect rectal bleeding secondary to hemorrhoids  . ESOPHAGOGASTRODUODENOSCOPY  03/18/09   fundic gland polyps/mild gastritis  . HERNIA REPAIR  1978  . JOINT REPLACEMENT     bil knee replacement  . KNEE ARTHROSCOPY    . LEFT HEART CATH AND CORONARY ANGIOGRAPHY N/A 08/08/2018   Procedure: LEFT HEART CATH AND CORONARY ANGIOGRAPHY;  Surgeon: Jettie Booze, MD;  Location: [facility];  Service: Cardiovascular;  Laterality: N/A;  . MULTIPLE EXTRACTIONS WITH ALVEOLOPLASTY N/A 08/16/2015   Procedure: EXTRACTION OF TEETH THREE, SIX, EIGHT, NINE, ELEVEN, FOURTEEN, FIFTEEN, TWENTY-EIGHT WITH ALVEOLOPLASTY;  Surgeon: Diona Browner, DDS;  Location: [facility];  Service: Oral Surgery;  Laterality: N/A;  . ROTATOR CUFF REPAIR Right   . TONSILLECTOMY    . TOTAL VAGINAL HYSTERECTOMY    . TUBAL LIGATION      There were no vitals filed for this visit.  Subjective Assessment - 05/13/19 1626    Subjective  Pt is no longer sore from her fall.  PT is hoping compression bandaging on the thigh will help the reduction.    Pertinent History  CAD , COPD    How long can you stand comfortably?  2 minutes    How long can you walk comfortably?  125ft uses a cane and O2    Patient Stated Goals  decreaed edema, to be able to walk and stand longer    Currently in Pain?  No/denies    Pain Onset  More than a month ago                       [facility] Adult PT Treatment/Exercise - 05/13/19 0001      Manual Therapy    Manual Therapy  Compression Bandaging;Manual Lymphatic Drainage (MLD);Other (comment)    Manual therapy comments  completed seperate from all other aspects of treatment.     Manual Lymphatic Drainage (MLD)  To include supraclavicular, deep and superfical abdominal, routing fluid using inguuinal axillary anastomosis and LE.  Posterior aspect done sidelying.    Compression Bandaging  using foam and multilayer short stretch bandaging from toes  to thigh B.                PT Short Term Goals - 04/14/19 1448      PT SHORT TERM GOAL #1   Title  PT to lose 3-5 cm volume from both LE to reduce risk of cellulitis    Time  4    Period  Weeks    Status  On-going    Target Date  05/09/19      PT SHORT TERM GOAL #2   Title  PT to know the sign and sx of cellulitis and the importance of getting immediate medical attention.    Time  4    Period  Weeks    Status  On-going        PT Long Term Goals - 04/14/19 1449      PT LONG TERM GOAL #1   Title  Pt to have lost between 5-8 cm of fluid to allow pt to be able to stand for 10 minutes in order to complete self grooming activites.    Time  8    Period  Weeks    Status  On-going      PT LONG TERM GOAL #2   Title  Pt LE pain to be no greater than 2/10  to be able to walk with her cane for ten minutes to allow pt to get to her medical appointments with greater ease.    Time  8    Period  Weeks    Status  On-going      PT LONG TERM GOAL #3   Title  PT to have obtained and be using a compression pump for maintenance phase of treatment.    Time  8    Period  Weeks    Status  On-going      PT LONG TERM GOAL #4   Title  PT to have thigh high compression garments and to be  wearing daily.    Time  8    Period  Weeks    Status  On-going            Plan - 05/13/19 1628    Clinical Impression Statement  Pt has not been notified about the pump, (therapist will e-mail Allison Sharp), PT only being seen 2x this week due to pt having an MD's  appointment on Monday.  Will attempt to see pt on Friday.    Personal Factors and Comorbidities  Comorbidity 3+    Comorbidities  B TKR, CAD, COPD, back surgery.    Examination-Activity Limitations  Lift;Locomotion Level;Stand;Transfers    Examination-Participation Restrictions  Community Activity;Cleaning;Laundry    Stability/Clinical Decision Making  Evolving/Moderate complexity    Rehab Potential  Good    PT Frequency  3x / week    PT Duration  8 weeks    PT Treatment/Interventions  Compression bandaging;Manual lymph drainage;Patient/family education;ADLs/Self Care Home Management    PT Next Visit Plan  Continue total decongestive techniques and  bandaging to bilateral LE's. Measure on Fridays.    PT Home Exercise Plan  ankle pumps, LAQ, hip ab/adduction, marching and diaphragmic breathing.       Patient will benefit from skilled therapeutic intervention in order to improve the following deficits and impairments:  Difficulty walking, Increased edema, Obesity, Pain  Visit Diagnosis: Pain in left leg  Pain in right leg  Difficulty in walking, not elsewhere classified  Lymphedema, not elsewhere classified     Problem List Patient Active Problem List   Diagnosis Date Noted  . Chronic acquired lymphedema 02/07/2019  . Bilateral shoulder pain 02/07/2019  . Allergies 10/17/2018  . Memory loss 09/16/2018  . Prolonged QT interval 08/06/2018  . Chronic obstructive pulmonary disease ([place]) 05/06/2018  . Orthostatic hypotension 10/24/2017  . Fatty liver 10/24/2017  . Thrombocytopenia ([place]) 09/29/2017  . Visual hallucinations   . Bipolar I disorder, most recent episode depressed, severe w psychosis ([place]) 06/21/2016  . History of MI (myocardial infarction) 06/21/2016  . Spinal stenosis of lumbar region with neurogenic claudication 03/29/2016  . Severe obesity (BMI >= 40) ([place]) 07/05/2015  . Hyperlipidemia LDL goal <130 07/05/2015  . MDD (major depressive disorder), single episode,  severe with psychotic features ([place]) 01/21/2015  . Bilateral knee pain 01/07/2014  . Scar condition and fibrosis of skin 12/11/2013  . Essential hypertension, benign 07/01/2013  . OSA on CPAP 11/04/2012  . Back pain, chronic 08/27/2012  . Vitamin D insufficiency 04/04/2012  . Insomnia due to mental disorder 04/04/2012  . Anxiety 04/04/2012  . OCD (obsessive compulsive disorder) 04/04/2012  . PTSD (post-traumatic stress disorder) 06/28/2011  . Coronary artery disease 08/26/2010  . GERD 03/01/2009  . Morbid obesity ([place]) 02/15/2009  . Hypothyroidism 02/12/2009    [name], PT CLT [phone] 05/13/2019, 4:31 PM  [facility] [address] [place], [place], [address] Phone: [phone]   Fax:  [phone]  Name: Allison Sharp MRN: [id number] Date of Birth: Nov 25, 1955

## 2019-05-15 ENCOUNTER — Other Ambulatory Visit: Payer: Self-pay

## 2019-05-15 ENCOUNTER — Telehealth (HOSPITAL_COMMUNITY): Payer: Self-pay | Admitting: Physical Therapy

## 2019-05-15 ENCOUNTER — Ambulatory Visit (HOSPITAL_COMMUNITY): Payer: Medicare Other | Admitting: Physical Therapy

## 2019-05-15 DIAGNOSIS — R262 Difficulty in walking, not elsewhere classified: Secondary | ICD-10-CM

## 2019-05-15 DIAGNOSIS — M79604 Pain in right leg: Secondary | ICD-10-CM

## 2019-05-15 DIAGNOSIS — M79605 Pain in left leg: Secondary | ICD-10-CM

## 2019-05-15 DIAGNOSIS — I890 Lymphedema, not elsewhere classified: Secondary | ICD-10-CM | POA: Diagnosis not present

## 2019-05-15 LAB — CMP14+EGFR
ALT: 8 IU/L (ref 0–32)
AST: 15 IU/L (ref 0–40)
Albumin/Globulin Ratio: 1.5 (ref 1.2–2.2)
Albumin: 4.3 g/dL (ref 3.8–4.8)
Alkaline Phosphatase: 86 IU/L (ref 39–117)
BUN/Creatinine Ratio: 19 (ref 12–28)
BUN: 13 mg/dL (ref 8–27)
Bilirubin Total: 0.3 mg/dL (ref 0.0–1.2)
CO2: 23 mmol/L (ref 20–29)
Calcium: 9.7 mg/dL (ref 8.7–10.3)
Chloride: 103 mmol/L (ref 96–106)
Creatinine, Ser: 0.68 mg/dL (ref 0.57–1.00)
GFR calc Af Amer: 106 mL/min/{1.73_m2} (ref >59)
GFR calc non Af Amer: 92 mL/min/{1.73_m2} (ref >59)
Globulin, Total: 2.8 g/dL (ref 1.5–4.5)
Glucose: 107 mg/dL — ABNORMAL HIGH (ref 65–99)
Potassium: 4.3 mmol/L (ref 3.5–5.2)
Sodium: 146 mmol/L — ABNORMAL HIGH (ref 134–144)
Total Protein: 7.1 g/dL (ref 6.0–8.5)

## 2019-05-15 LAB — CBC WITH DIFFERENTIAL/PLATELET
Basophils Absolute: 0 10*3/uL (ref 0.0–0.2)
Basos: 1 %
EOS (ABSOLUTE): 0.2 10*3/uL (ref 0.0–0.4)
Eos: 3 %
Hematocrit: 42.5 % (ref 34.0–46.6)
Hemoglobin: 14.4 g/dL (ref 11.1–15.9)
Immature Grans (Abs): 0 10*3/uL (ref 0.0–0.1)
Immature Granulocytes: 0 %
Lymphocytes Absolute: 1.6 10*3/uL (ref 0.7–3.1)
Lymphs: 25 %
MCH: 31.3 pg (ref 26.6–33.0)
MCHC: 33.9 g/dL (ref 31.5–35.7)
MCV: 92 fL (ref 79–97)
Monocytes Absolute: 0.6 10*3/uL (ref 0.1–0.9)
Monocytes: 9 %
Neutrophils Absolute: 4.1 10*3/uL (ref 1.4–7.0)
Neutrophils: 62 %
Platelets: 104 10*3/uL — ABNORMAL LOW (ref 150–450)
RBC: 4.6 x10E6/uL (ref 3.77–5.28)
RDW: 13.5 % (ref 11.7–15.4)
WBC: 6.5 10*3/uL (ref 3.4–10.8)

## 2019-05-15 LAB — LIPID PANEL
Chol/HDL Ratio: 4.3 ratio (ref 0.0–4.4)
Cholesterol, Total: 181 mg/dL (ref 100–199)
HDL: 42 mg/dL (ref >39)
LDL Chol Calc (NIH): 104 mg/dL — ABNORMAL HIGH (ref 0–99)
Triglycerides: 205 mg/dL — ABNORMAL HIGH (ref 0–149)
VLDL Cholesterol Cal: 35 mg/dL (ref 5–40)

## 2019-05-15 LAB — VALPROIC ACID LEVEL: Valproic Acid Lvl: 40 ug/mL — ABNORMAL LOW (ref 50–100)

## 2019-05-15 LAB — TSH: TSH: 6.6 u[IU]/mL — ABNORMAL HIGH (ref 0.450–4.500)

## 2019-05-15 NOTE — Therapy (Signed)
[place], [place], [address] Phone: [phone]   Fax:  [phone]  Physical Therapy Treatment  Patient Details  Name: Allison Sharp MRN: [id number] Date of Birth: 19-Dec-1955 Referring Provider (PT): Ronnald Collum    Encounter Date: 05/15/2019  PT End of Session - 05/15/19 1748    Visit Number  12    Number of Visits  24    Date for PT Re-Evaluation  06/06/19    Authorization - Visit Number  3    Authorization - Number of Visits  10    Activity Tolerance  Patient tolerated treatment well    Behavior During Therapy  [facility] for tasks assessed/performed       Past Medical History:  Diagnosis Date  . Allergy   . Anxiety   . Arthritis   . Asthma   . Bipolar 1 disorder ([place])   . CAD (coronary artery disease)   . Cellulitis   . CHF (congestive heart failure) (HCC)    diastolic dysfunction  . Chronic back pain   . Chronic headaches   . Complication of anesthesia    States she typically gets sick s/p anesthesia  . Contusion of sacrum   . COPD (chronic obstructive pulmonary disease) ([place])   . Dementia ([place])   . Depression   . Dyspnea    PFT 03/05/09 FEV1 2.77(98%), FVC 3.25(86%), FEV1% 85, TLC 5.88(99%), DLCO 60% ,  Methacholine challenge 03/16/09 normal ,  CT chest 03/12/09 no pulmonary disease  . Fungal infection   . GERD (gastroesophageal reflux disease)   . History of colonoscopy 10/17/2002   by Dr Rehman-> distal non-specific proctitis, small ext hemorrhoids,   . HTN (hypertension)   . Hyperlipidemia   . Hyperthyroidism    radioactive - thyroid cancer   . Migraine headache   . Morbid obesity with body mass index of 50.0-59.9 in adult [facility]) JAN 2011 370 LBS   2004 311 BMI 45.9  . Myocardial infarction ([place])    NOV 1997  . OSA on CPAP    she had been on 2L O2 at night but that was stopped  . PONV (postoperative nausea and vomiting)   . PTSD (post-traumatic stress disorder)   . PTSD (post-traumatic  stress disorder)   . Suicidal ideation   . Urine incontinence   . Vitamin D deficiency     Past Surgical History:  Procedure Laterality Date  . ABDOMINAL HYSTERECTOMY     sept 1996  . APPENDECTOMY    . BACK SURGERY  2008  . CARDIAC CATHETERIZATION     nov 1997  . CHOLECYSTECTOMY    . COLONOSCOPY  10/17/2002    Distal proctitis, small external hemorrhoids, otherwise/  normal colonoscopy. Suspect rectal bleeding secondary to hemorrhoids  . ESOPHAGOGASTRODUODENOSCOPY  03/18/09   fundic gland polyps/mild gastritis  . HERNIA REPAIR  1978  . JOINT REPLACEMENT     bil knee replacement  . KNEE ARTHROSCOPY    . LEFT HEART CATH AND CORONARY ANGIOGRAPHY N/A 08/08/2018   Procedure: LEFT HEART CATH AND CORONARY ANGIOGRAPHY;  Surgeon: Jettie Booze, MD;  Location: [facility];  Service: Cardiovascular;  Laterality: N/A;  . MULTIPLE EXTRACTIONS WITH ALVEOLOPLASTY N/A 08/16/2015   Procedure: EXTRACTION OF TEETH THREE, SIX, EIGHT, NINE, ELEVEN, FOURTEEN, FIFTEEN, TWENTY-EIGHT WITH ALVEOLOPLASTY;  Surgeon: Diona Browner, DDS;  Location: [facility];  Service: Oral Surgery;  Laterality: N/A;  . ROTATOR CUFF REPAIR Right   . TONSILLECTOMY    .  TOTAL VAGINAL HYSTERECTOMY    . TUBAL LIGATION      There were no vitals filed for this visit.  Subjective Assessment - 05/15/19 1745    Subjective  Daughter (Lyfe Monger) states she spoke to Burley from [place] about her compression but still has not heard about the pump.            LYMPHEDEMA/ONCOLOGY QUESTIONNAIRE - 05/15/19 1749      Right Lower Extremity Lymphedema   20 cm Proximal to Suprapatella  95 cm   94.3   10 cm Proximal to Suprapatella  91 cm   90.7   At Midpatella/Popliteal Crease  66 cm   was 68.8   30 cm Proximal to Floor at Lateral Plantar Foot  65.5 cm   68.3   20 cm Proximal to Floor at Lateral Plantar Foot  56.5 1   51   10 cm Proximal to Floor at Lateral Malleoli  38.7 cm   51   Circumference of ankle/heel  37.4 cm.    37.7   5 cm Proximal to 1st MTP Joint  26.5 cm   28   Across MTP Joint  25.2 cm   27     Left Lower Extremity Lymphedema   20 cm Proximal to Suprapatella  93 cm    10 cm Proximal to Suprapatella  85 cm   was 87.8   At Midpatella/Popliteal Crease  65 cm   64.8   30 cm Proximal to Floor at Lateral Plantar Foot  64 cm    20 cm Proximal to Floor at Lateral Plantar Foot  56 cm   58.3   10 cm Proximal to Floor at Lateral Malleoli  38.7 cm   49   Circumference of ankle/heel  37.4 cm.   40.3   5 cm Proximal to 1st MTP Joint  26.7 cm   28.2   Across MTP Joint  26 cm   was 26.3               [facility] Adult PT Treatment/Exercise - 05/15/19 0001      Manual Therapy   Manual Therapy  Compression Bandaging;Manual Lymphatic Drainage (MLD);Other (comment)    Manual therapy comments  completed seperate from all other aspects of treatment.     Manual Lymphatic Drainage (MLD)  To include supraclavicular, deep and superfical abdominal, routing fluid using inguuinal axillary anastomosis and LE.  Posterior aspect done sidelying.    Compression Bandaging  using foam and multilayer short stretch bandaging from toes  to thigh B.     Other Manual Therapy  measurement             PT Education - 05/15/19 1747    Education Details  trained daughter on bandaging distal LE and had her wrap one LE after observing therapist.  given handout on steps to follow.    Person(s) Educated  Patient;Child(ren)    Methods  Explanation;Demonstration;Tactile cues;Verbal cues;Handout    Comprehension  Verbalized understanding;Returned demonstration;Verbal cues required;Tactile cues required;Need further instruction       PT Short Term Goals - 04/14/19 1448      PT SHORT TERM GOAL #1   Title  PT to lose 3-5 cm volume from both LE to reduce risk of cellulitis    Time  4    Period  Weeks    Status  On-going    Target Date  05/09/19      PT SHORT TERM GOAL #2   Title  PT  to know the sign and sx of  cellulitis and the importance of getting immediate medical attention.    Time  4    Period  Weeks    Status  On-going        PT Long Term Goals - 04/14/19 1449      PT LONG TERM GOAL #1   Title  Pt to have lost between 5-8 cm of fluid to allow pt to be able to stand for 10 minutes in order to complete self grooming activites.    Time  8    Period  Weeks    Status  On-going      PT LONG TERM GOAL #2   Title  Pt LE pain to be no greater than 2/10  to be able to walk with her cane for ten minutes to allow pt to get to her medical appointments with greater ease.    Time  8    Period  Weeks    Status  On-going      PT LONG TERM GOAL #3   Title  PT to have obtained and be using a compression pump for maintenance phase of treatment.    Time  8    Period  Weeks    Status  On-going      PT LONG TERM GOAL #4   Title  PT to have thigh high compression garments and to be wearing daily.    Time  8    Period  Weeks    Status  On-going            Plan - 05/15/19 1750    Clinical Impression Statement  measured this session with reduction noted from feet to 10cm ankle mark and all others without significant change.  Pt and daugther both reporet good reduction in bilateral LE's since beginning therapy.  Educated through demonstration, verbal and tactile cues on how to bandage distal LE.  Pt given handout with instructions.  Atley Neubert ([place]uther) wrapped one LE following therapist with cues along the way.  Pt overall completed task well. Evaluating therapist then returned to inform patient and daughter she had spoken to [place] regarding pump and left a message for [place] regarding stockings (would like juxtas instead of stockings).    Personal Factors and Comorbidities  Comorbidity 3+    Comorbidities  B TKR, CAD, COPD, back surgery.    Examination-Activity Limitations  Lift;Locomotion Level;Stand;Transfers    Examination-Participation Restrictions  Community Activity;Cleaning;Laundry     Stability/Clinical Decision Making  Evolving/Moderate complexity    Rehab Potential  Good    PT Frequency  3x / week    PT Duration  8 weeks    PT Treatment/Interventions  Compression bandaging;Manual lymph drainage;Patient/family education;ADLs/Self Care Home Management    PT Next Visit Plan  Continue total decongestive techniques and  bandaging to bilateral LE's. Measure on thursdays or Fridays.    PT Home Exercise Plan  ankle pumps, LAQ, hip ab/adduction, marching and diaphragmic breathing.       Patient will benefit from skilled therapeutic intervention in order to improve the following deficits and impairments:  Difficulty walking, Increased edema, Obesity, Pain  Visit Diagnosis: Difficulty in walking, not elsewhere classified  Pain in left leg  Pain in right leg     Problem List Patient Active Problem List   Diagnosis Date Noted  . Chronic acquired lymphedema 02/07/2019  . Bilateral shoulder pain 02/07/2019  . Allergies 10/17/2018  . Memory loss 09/16/2018  . Prolonged QT  interval 08/06/2018  . Chronic obstructive pulmonary disease ([place]) 05/06/2018  . Orthostatic hypotension 10/24/2017  . Fatty liver 10/24/2017  . Thrombocytopenia ([place]) 09/29/2017  . Visual hallucinations   . Bipolar I disorder, most recent episode depressed, severe w psychosis ([place]) 06/21/2016  . History of MI (myocardial infarction) 06/21/2016  . Spinal stenosis of lumbar region with neurogenic claudication 03/29/2016  . Severe obesity (BMI >= 40) ([place]) 07/05/2015  . Hyperlipidemia LDL goal <130 07/05/2015  . MDD (major depressive disorder), single episode, severe with psychotic features ([place]) 01/21/2015  . Bilateral knee pain 01/07/2014  . Scar condition and fibrosis of skin 12/11/2013  . Essential hypertension, benign 07/01/2013  . OSA on CPAP 11/04/2012  . Back pain, chronic 08/27/2012  . Vitamin D insufficiency 04/04/2012  . Insomnia due to mental disorder 04/04/2012  . Anxiety 04/04/2012   . OCD (obsessive compulsive disorder) 04/04/2012  . PTSD (post-traumatic stress disorder) 06/28/2011  . Coronary artery disease 08/26/2010  . GERD 03/01/2009  . Morbid obesity ([place]) 02/15/2009  . Hypothyroidism 02/12/2009   Teena Irani, PTA/CLT [phone]  Teena Irani 05/15/2019, 5:54 PM  [facility] [address] [place], [place], [address] Phone: [phone]   Fax:  [phone]  Name: Allison Sharp MRN: [id number] Date of Birth: 06-Sep-1955

## 2019-05-15 NOTE — Telephone Encounter (Signed)
Pt cancelled her appt on 12/23, no reason given

## 2019-05-16 LAB — ANAEROBIC AND AEROBIC CULTURE: Result 1: NEGATIVE — AB

## 2019-05-19 ENCOUNTER — Other Ambulatory Visit: Payer: Self-pay

## 2019-05-19 ENCOUNTER — Ambulatory Visit (HOSPITAL_COMMUNITY): Payer: Medicare Other | Admitting: Physical Therapy

## 2019-05-19 DIAGNOSIS — I890 Lymphedema, not elsewhere classified: Secondary | ICD-10-CM

## 2019-05-19 DIAGNOSIS — R262 Difficulty in walking, not elsewhere classified: Secondary | ICD-10-CM

## 2019-05-19 MED ORDER — LEVOTHYROXINE SODIUM 125 MCG PO TABS: 125.0000 ug | ORAL_TABLET | Freq: Every day | ORAL | 1 refills | Status: DC

## 2019-05-19 NOTE — Therapy (Signed)
[place], [place], [address] Phone: [phone]   Fax:  [phone]  Physical Therapy Treatment  Patient Details  Name: Allison Sharp MRN: [id number] Date of Birth: 1956/03/17 Referring Provider (PT): Ronnald Collum    Encounter Date: 05/19/2019  PT End of Session - 05/19/19 1623    Visit Number  13    Number of Visits  24    Date for PT Re-Evaluation  06/06/19    Authorization - Visit Number  4    Authorization - Number of Visits  10    PT Start Time  [id number]    PT Stop Time  1615    PT Time Calculation (min)  90 min    Activity Tolerance  Patient tolerated treatment well    Behavior During Therapy  [facility] for tasks assessed/performed       Past Medical History:  Diagnosis Date  . Allergy   . Anxiety   . Arthritis   . Asthma   . Bipolar 1 disorder ([place])   . CAD (coronary artery disease)   . Cellulitis   . CHF (congestive heart failure) (HCC)    diastolic dysfunction  . Chronic back pain   . Chronic headaches   . Complication of anesthesia    States she typically gets sick s/p anesthesia  . Contusion of sacrum   . COPD (chronic obstructive pulmonary disease) ([place])   . Dementia ([place])   . Depression   . Dyspnea    PFT 03/05/09 FEV1 2.77(98%), FVC 3.25(86%), FEV1% 85, TLC 5.88(99%), DLCO 60% ,  Methacholine challenge 03/16/09 normal ,  CT chest 03/12/09 no pulmonary disease  . Fungal infection   . GERD (gastroesophageal reflux disease)   . History of colonoscopy 10/17/2002   by Dr Rehman-> distal non-specific proctitis, small ext hemorrhoids,   . HTN (hypertension)   . Hyperlipidemia   . Hyperthyroidism    radioactive - thyroid cancer   . Migraine headache   . Morbid obesity with body mass index of 50.0-59.9 in adult [facility]) JAN 2011 370 LBS   2004 311 BMI 45.9  . Myocardial infarction ([place])    NOV 1997  . OSA on CPAP    she had been on 2L O2 at night but that was stopped  . PONV (postoperative  nausea and vomiting)   . PTSD (post-traumatic stress disorder)   . PTSD (post-traumatic stress disorder)   . Suicidal ideation   . Urine incontinence   . Vitamin D deficiency     Past Surgical History:  Procedure Laterality Date  . ABDOMINAL HYSTERECTOMY     sept 1996  . APPENDECTOMY    . BACK SURGERY  2008  . CARDIAC CATHETERIZATION     nov 1997  . CHOLECYSTECTOMY    . COLONOSCOPY  10/17/2002    Distal proctitis, small external hemorrhoids, otherwise/  normal colonoscopy. Suspect rectal bleeding secondary to hemorrhoids  . ESOPHAGOGASTRODUODENOSCOPY  03/18/09   fundic gland polyps/mild gastritis  . HERNIA REPAIR  1978  . JOINT REPLACEMENT     bil knee replacement  . KNEE ARTHROSCOPY    . LEFT HEART CATH AND CORONARY ANGIOGRAPHY N/A 08/08/2018   Procedure: LEFT HEART CATH AND CORONARY ANGIOGRAPHY;  Surgeon: Jettie Booze, MD;  Location: [facility];  Service: Cardiovascular;  Laterality: N/A;  . MULTIPLE EXTRACTIONS WITH ALVEOLOPLASTY N/A 08/16/2015   Procedure: EXTRACTION OF TEETH THREE, SIX, EIGHT, NINE, ELEVEN, FOURTEEN, FIFTEEN, TWENTY-EIGHT WITH ALVEOLOPLASTY;  Surgeon: Diona Browner, DDS;  Location: [facility];  Service: Oral Surgery;  Laterality: N/A;  . ROTATOR CUFF REPAIR Right   . TONSILLECTOMY    . TOTAL VAGINAL HYSTERECTOMY    . TUBAL LIGATION      There were no vitals filed for this visit.  Subjective Assessment - 05/19/19 1620    Subjective  Pt states that [name] called and left a message Friday about the pump but daughter has not had a chance to call back.    Pertinent History  CAD , COPD    How long can you stand comfortably?  2 minutes    How long can you walk comfortably?  167ft uses a cane and O2    Patient Stated Goals  decreaed edema, to be able to walk and stand longer    Currently in Pain?  No/denies    Pain Onset  More than a month ago                       [facility] Adult PT Treatment/Exercise - 05/19/19 0001      Manual  Therapy   Manual Therapy  Compression Bandaging;Manual Lymphatic Drainage (MLD);Other (comment)    Manual therapy comments  completed seperate from all other aspects of treatment.     Manual Lymphatic Drainage (MLD)  To include supraclavicular, deep and superfical abdominal, routing fluid using inguuinal axillary anastomosis and LE.  Posterior aspect done sidelying.    Compression Bandaging  using foam and multilayer short stretch bandaging from toes  to thigh B.              PT Education - 05/19/19 1622    Education Details  Educated daughter, [name] on how to compression bandage LE with foam and multilayer compression bandages.    Person(s) Educated  Patient    Methods  Explanation;Tactile cues;Verbal cues    Comprehension  Verbalized understanding;Returned demonstration;Need further instruction       PT Short Term Goals - 04/14/19 1448      PT SHORT TERM GOAL #1   Title  PT to lose 3-5 cm volume from both LE to reduce risk of cellulitis    Time  4    Period  Weeks    Status  On-going    Target Date  05/09/19      PT SHORT TERM GOAL #2   Title  PT to know the sign and sx of cellulitis and the importance of getting immediate medical attention.    Time  4    Period  Weeks    Status  On-going        PT Long Term Goals - 04/14/19 1449      PT LONG TERM GOAL #1   Title  Pt to have lost between 5-8 cm of fluid to allow pt to be able to stand for 10 minutes in order to complete self grooming activites.    Time  8    Period  Weeks    Status  On-going      PT LONG TERM GOAL #2   Title  Pt LE pain to be no greater than 2/10  to be able to walk with her cane for ten minutes to allow pt to get to her medical appointments with greater ease.    Time  8    Period  Weeks    Status  On-going      PT LONG TERM GOAL #3   Title  PT to have obtained and be using a compression pump for maintenance phase of treatment.    Time  8    Period  Weeks    Status  On-going      PT LONG  TERM GOAL #4   Title  PT to have thigh high compression garments and to be wearing daily.    Time  8    Period  Weeks    Status  On-going            Plan - 05/19/19 1624    Clinical Impression Statement  Pt daughter further instructed on compression bandaging but will continue to need reinforcement.  Pt distal thighs appear somewhat less indurated.    Personal Factors and Comorbidities  Comorbidity 3+    Comorbidities  B TKR, CAD, COPD, back surgery.    Examination-Activity Limitations  Lift;Locomotion Level;Stand;Transfers    Examination-Participation Restrictions  Community Activity;Cleaning;Laundry    Stability/Clinical Decision Making  Evolving/Moderate complexity    Rehab Potential  Good    PT Frequency  3x / week    PT Duration  8 weeks    PT Treatment/Interventions  Compression bandaging;Manual lymph drainage;Patient/family education;ADLs/Self Care Home Management    PT Next Visit Plan  Continue total decongestive techniques and  bandaging to bilateral LE's. Measure on thursdays or Fridays.    PT Home Exercise Plan  ankle pumps, LAQ, hip ab/adduction, marching and diaphragmic breathing.       Patient will benefit from skilled therapeutic intervention in order to improve the following deficits and impairments:  Difficulty walking, Increased edema, Obesity, Pain  Visit Diagnosis: Difficulty in walking, not elsewhere classified  Lymphedema, not elsewhere classified     Problem List Patient Active Problem List   Diagnosis Date Noted  . Chronic acquired lymphedema 02/07/2019  . Bilateral shoulder pain 02/07/2019  . Allergies 10/17/2018  . Memory loss 09/16/2018  . Prolonged QT interval 08/06/2018  . Chronic obstructive pulmonary disease ([place]) 05/06/2018  . Orthostatic hypotension 10/24/2017  . Fatty liver 10/24/2017  . Thrombocytopenia ([place]) 09/29/2017  . Visual hallucinations   . Bipolar I disorder, most recent episode depressed, severe w psychosis ([place])  06/21/2016  . History of MI (myocardial infarction) 06/21/2016  . Spinal stenosis of lumbar region with neurogenic claudication 03/29/2016  . Severe obesity (BMI >= 40) ([place]) 07/05/2015  . Hyperlipidemia LDL goal <130 07/05/2015  . MDD (major depressive disorder), single episode, severe with psychotic features ([place]) 01/21/2015  . Bilateral knee pain 01/07/2014  . Scar condition and fibrosis of skin 12/11/2013  . Essential hypertension, benign 07/01/2013  . OSA on CPAP 11/04/2012  . Back pain, chronic 08/27/2012  . Vitamin D insufficiency 04/04/2012  . Insomnia due to mental disorder 04/04/2012  . Anxiety 04/04/2012  . OCD (obsessive compulsive disorder) 04/04/2012  . PTSD (post-traumatic stress disorder) 06/28/2011  . Coronary artery disease 08/26/2010  . GERD 03/01/2009  . Morbid obesity ([place]) 02/15/2009  . Hypothyroidism 02/12/2009    [name], PT CLT [phone] 05/19/2019, 4:26 PM  [facility] [address] [place], [place], [address] Phone: [phone]   Fax:  [phone]  Name: KEVIONA KENNEBECK MRN: [id number] Date of Birth: [birth year]

## 2019-05-20 ENCOUNTER — Other Ambulatory Visit: Payer: Self-pay | Admitting: Family Medicine

## 2019-05-21 ENCOUNTER — Ambulatory Visit (HOSPITAL_COMMUNITY): Payer: Medicare Other | Admitting: Physical Therapy

## 2019-05-22 ENCOUNTER — Ambulatory Visit (HOSPITAL_COMMUNITY): Payer: Medicare Other | Admitting: Physical Therapy

## 2019-05-22 ENCOUNTER — Telehealth (HOSPITAL_COMMUNITY): Payer: Self-pay | Admitting: Physical Therapy

## 2019-05-22 NOTE — Telephone Encounter (Signed)
Pt did not show for scheduled appointment after being confirmed on 12/23.  Left message with [name] regarding missed appointment.   Teena Irani, PTA/CLT [phone]

## 2019-05-28 ENCOUNTER — Telehealth (HOSPITAL_COMMUNITY): Payer: Self-pay | Admitting: Physical Therapy

## 2019-05-28 ENCOUNTER — Ambulatory Visit (HOSPITAL_COMMUNITY): Payer: Medicare Other | Admitting: Physical Therapy

## 2019-05-28 NOTE — Telephone Encounter (Signed)
pt's family member called to cancel due to lack of transportation

## 2019-05-29 ENCOUNTER — Other Ambulatory Visit: Payer: Self-pay

## 2019-05-29 ENCOUNTER — Ambulatory Visit (HOSPITAL_COMMUNITY): Payer: Medicare Other | Admitting: Physical Therapy

## 2019-05-29 DIAGNOSIS — I890 Lymphedema, not elsewhere classified: Secondary | ICD-10-CM | POA: Diagnosis not present

## 2019-05-29 DIAGNOSIS — R262 Difficulty in walking, not elsewhere classified: Secondary | ICD-10-CM

## 2019-05-29 DIAGNOSIS — M79605 Pain in left leg: Secondary | ICD-10-CM

## 2019-05-29 NOTE — Therapy (Signed)
[place], [place], [address] Phone: [phone]   Fax:  [phone]  Physical Therapy Treatment  Patient Details  Name: Allison Sharp MRN: [id number] Date of Birth: 11/23/1955 Referring Provider (PT): Ronnald Collum    Encounter Date: 05/29/2019  PT End of Session - 05/29/19 1310    Visit Number  14    Number of Visits  24    Date for PT Re-Evaluation  06/06/19    Authorization - Visit Number  5    Authorization - Number of Visits  10    PT Start Time  1130    PT Stop Time  1255    PT Time Calculation (min)  85 min    Activity Tolerance  Patient tolerated treatment well    Behavior During Therapy  [facility] for tasks assessed/performed       Past Medical History:  Diagnosis Date  . Allergy   . Anxiety   . Arthritis   . Asthma   . Bipolar 1 disorder ([place])   . CAD (coronary artery disease)   . Cellulitis   . CHF (congestive heart failure) (HCC)    diastolic dysfunction  . Chronic back pain   . Chronic headaches   . Complication of anesthesia    States she typically gets sick s/p anesthesia  . Contusion of sacrum   . COPD (chronic obstructive pulmonary disease) ([place])   . Dementia ([place])   . Depression   . Dyspnea    PFT 03/05/09 FEV1 2.77(98%), FVC 3.25(86%), FEV1% 85, TLC 5.88(99%), DLCO 60% ,  Methacholine challenge 03/16/09 normal ,  CT chest 03/12/09 no pulmonary disease  . Fungal infection   . GERD (gastroesophageal reflux disease)   . History of colonoscopy 10/17/2002   by Dr Rehman-> distal non-specific proctitis, small ext hemorrhoids,   . HTN (hypertension)   . Hyperlipidemia   . Hyperthyroidism    radioactive - thyroid cancer   . Migraine headache   . Morbid obesity with body mass index of 50.0-59.9 in adult [facility]) JAN 2011 370 LBS   2004 311 BMI 45.9  . Myocardial infarction ([place])    NOV 1997  . OSA on CPAP    she had been on 2L O2 at night but that was stopped  . PONV (postoperative  nausea and vomiting)   . PTSD (post-traumatic stress disorder)   . PTSD (post-traumatic stress disorder)   . Suicidal ideation   . Urine incontinence   . Vitamin D deficiency     Past Surgical History:  Procedure Laterality Date  . ABDOMINAL HYSTERECTOMY     sept 1996  . APPENDECTOMY    . BACK SURGERY  2008  . CARDIAC CATHETERIZATION     nov 1997  . CHOLECYSTECTOMY    . COLONOSCOPY  10/17/2002    Distal proctitis, small external hemorrhoids, otherwise/  normal colonoscopy. Suspect rectal bleeding secondary to hemorrhoids  . ESOPHAGOGASTRODUODENOSCOPY  03/18/09   fundic gland polyps/mild gastritis  . HERNIA REPAIR  1978  . JOINT REPLACEMENT     bil knee replacement  . KNEE ARTHROSCOPY    . LEFT HEART CATH AND CORONARY ANGIOGRAPHY N/A 08/08/2018   Procedure: LEFT HEART CATH AND CORONARY ANGIOGRAPHY;  Surgeon: Jettie Booze, MD;  Location: [place] [facility];  Service: Cardiovascular;  Laterality: N/A;  . MULTIPLE EXTRACTIONS WITH ALVEOLOPLASTY N/A 08/16/2015   Procedure: EXTRACTION OF TEETH THREE, SIX, EIGHT, NINE, ELEVEN, FOURTEEN, FIFTEEN, TWENTY-EIGHT WITH ALVEOLOPLASTY;  Surgeon: Diona Browner, DDS;  Location: [place];  Service: Oral Surgery;  Laterality: N/A;  . ROTATOR CUFF REPAIR Right   . TONSILLECTOMY    . TOTAL VAGINAL HYSTERECTOMY    . TUBAL LIGATION      There were no vitals filed for this visit.  Subjective Assessment - 05/29/19 1309    Subjective  Pt states that she got her pump and is pumping twice a day when she does not have the bandages on.    Pertinent History  CAD , COPD    How long can you stand comfortably?  2 minutes    How long can you walk comfortably?  179ft uses a cane and O2    Patient Stated Goals  decreaed edema, to be able to walk and stand longer    Pain Onset  More than a month ago            LYMPHEDEMA/ONCOLOGY QUESTIONNAIRE - 05/29/19 1144      Right Lower Extremity Lymphedema   20 cm Proximal to Suprapatella  94.5 cm  (Pended)      10 cm Proximal to Suprapatella  89 cm  (Pended)     At Midpatella/Popliteal Crease  66 cm  (Pended)     30 cm Proximal to Floor at Lateral Plantar Foot  64 cm  (Pended)     20 cm Proximal to Floor at Lateral Plantar Foot  59 1  (Pended)     10 cm Proximal to Floor at Lateral Malleoli  36.7 cm  (Pended)     Circumference of ankle/heel  37.5 cm.  (Pended)     5 cm Proximal to 1st MTP Joint  26.8 cm  (Pended)     Across MTP Joint  26.3 cm  (Pended)       Left Lower Extremity Lymphedema   20 cm Proximal to Suprapatella  91.8 cm  (Pended)     10 cm Proximal to Suprapatella  85.8 cm  (Pended)     At Midpatella/Popliteal Crease  63 cm  (Pended)     30 cm Proximal to Floor at Lateral Plantar Foot  62 cm  (Pended)     20 cm Proximal to Floor at Lateral Plantar Foot  52.5 cm  (Pended)     10 cm Proximal to Floor at Lateral Malleoli  39.8 cm  (Pended)     Circumference of ankle/heel  38.5 cm.  (Pended)     5 cm Proximal to 1st MTP Joint  27.7 cm  (Pended)     Across MTP Joint  27 cm  (Pended)                 [facility] Adult PT Treatment/Exercise - 05/29/19 0001      Manual Therapy   Manual Therapy  Compression Bandaging;Manual Lymphatic Drainage (MLD);Other (comment)    Manual therapy comments  completed seperate from all other aspects of treatment.     Manual Lymphatic Drainage (MLD)  To include supraclavicular, deep and superfical abdominal, routing fluid using inguuinal axillary anastomosis and LE.  Posterior aspect done sidelying.    Compression Bandaging  using foam and multilayer short stretch bandaging from toes  to thigh B.     Other Manual Therapy  measurement               PT Short Term Goals - 04/14/19 1448      PT SHORT TERM GOAL #1   Title  PT to lose 3-5 cm volume  from both LE to reduce risk of cellulitis    Time  4    Period  Weeks    Status  On-going    Target Date  05/09/19      PT SHORT TERM GOAL #2   Title  PT to know the sign and sx of cellulitis  and the importance of getting immediate medical attention.    Time  4    Period  Weeks    Status  On-going        PT Long Term Goals - 04/14/19 1449      PT LONG TERM GOAL #1   Title  Pt to have lost between 5-8 cm of fluid to allow pt to be able to stand for 10 minutes in order to complete self grooming activites.    Time  8    Period  Weeks    Status  On-going      PT LONG TERM GOAL #2   Title  Pt LE pain to be no greater than 2/10  to be able to walk with her cane for ten minutes to allow pt to get to her medical appointments with greater ease.    Time  8    Period  Weeks    Status  On-going      PT LONG TERM GOAL #3   Title  PT to have obtained and be using a compression pump for maintenance phase of treatment.    Time  8    Period  Weeks    Status  On-going      PT LONG TERM GOAL #4   Title  PT to have thigh high compression garments and to be wearing daily.    Time  8    Period  Weeks    Status  On-going            Plan - 05/29/19 1310    Clinical Impression Statement  Measured pt today, with pt using her compression pump she continued to slightly decrease in volumes despite not being at the clinic for a week.    Personal Factors and Comorbidities  Comorbidity 3+    Comorbidities  B TKR, CAD, COPD, back surgery.    Examination-Activity Limitations  Lift;Locomotion Level;Stand;Transfers    Examination-Participation Restrictions  Community Activity;Cleaning;Laundry    Stability/Clinical Decision Making  Evolving/Moderate complexity    Rehab Potential  Good    PT Frequency  3x / week    PT Duration  8 weeks    PT Treatment/Interventions  Compression bandaging;Manual lymph drainage;Patient/family education;ADLs/Self Care Home Management    PT Next Visit Plan  Continue total decongestive techniques and  bandaging to bilateral LE's. Measure on thursdays or Fridays.    PT Home Exercise Plan  ankle pumps, LAQ, hip ab/adduction, marching and diaphragmic breathing.        Patient will benefit from skilled therapeutic intervention in order to improve the following deficits and impairments:  Difficulty walking, Increased edema, Obesity, Pain  Visit Diagnosis: Lymphedema, not elsewhere classified  Difficulty in walking, not elsewhere classified  Pain in left leg     Problem List Patient Active Problem List   Diagnosis Date Noted  . Chronic acquired lymphedema 02/07/2019  . Bilateral shoulder pain 02/07/2019  . Allergies 10/17/2018  . Memory loss 09/16/2018  . Prolonged QT interval 08/06/2018  . Chronic obstructive pulmonary disease ([place]) 05/06/2018  . Orthostatic hypotension 10/24/2017  . Fatty liver 10/24/2017  . Thrombocytopenia ([place]) 09/29/2017  . Visual  hallucinations   . Bipolar I disorder, most recent episode depressed, severe w psychosis ([place]) 06/21/2016  . History of MI (myocardial infarction) 06/21/2016  . Spinal stenosis of lumbar region with neurogenic claudication 03/29/2016  . Severe obesity (BMI >= 40) ([place]) 07/05/2015  . Hyperlipidemia LDL goal <130 07/05/2015  . MDD (major depressive disorder), single episode, severe with psychotic features ([place]) 01/21/2015  . Bilateral knee pain 01/07/2014  . Scar condition and fibrosis of skin 12/11/2013  . Essential hypertension, benign 07/01/2013  . OSA on CPAP 11/04/2012  . Back pain, chronic 08/27/2012  . Vitamin D insufficiency 04/04/2012  . Insomnia due to mental disorder 04/04/2012  . Anxiety 04/04/2012  . OCD (obsessive compulsive disorder) 04/04/2012  . PTSD (post-traumatic stress disorder) 06/28/2011  . Coronary artery disease 08/26/2010  . GERD 03/01/2009  . Morbid obesity ([place]) 02/15/2009  . Hypothyroidism 02/12/2009   [name], PT CLT [phone] 05/29/2019, 1:12 PM  [place] [address] [place], [place], [address] Phone: [phone]   Fax:  [phone]  Name: KYLEN TSCHIDA MRN: [id number] Date of  Birth: [birth year]

## 2019-06-02 ENCOUNTER — Other Ambulatory Visit: Payer: Self-pay

## 2019-06-02 ENCOUNTER — Ambulatory Visit (HOSPITAL_COMMUNITY): Payer: Medicare Other | Attending: Nurse Practitioner | Admitting: Physical Therapy

## 2019-06-02 DIAGNOSIS — I890 Lymphedema, not elsewhere classified: Secondary | ICD-10-CM | POA: Diagnosis not present

## 2019-06-02 DIAGNOSIS — R262 Difficulty in walking, not elsewhere classified: Secondary | ICD-10-CM | POA: Diagnosis present

## 2019-06-02 DIAGNOSIS — M79604 Pain in right leg: Secondary | ICD-10-CM | POA: Insufficient documentation

## 2019-06-02 DIAGNOSIS — M79605 Pain in left leg: Secondary | ICD-10-CM | POA: Insufficient documentation

## 2019-06-02 NOTE — Therapy (Signed)
[place], [place], [address] Phone: [phone]   Fax:  [phone]  Physical Therapy Treatment  Patient Details  Name: Allison Sharp MRN: [id number] Date of Birth: 10/27/1955 Referring Provider (PT): Ronnald Collum    Encounter Date: 06/02/2019  PT End of Session - 06/02/19 1606    Visit Number  15    Number of Visits  24    Date for PT Re-Evaluation  06/06/19    Authorization - Visit Number  6    Authorization - Number of Visits  10    PT Start Time  1320    PT Stop Time  1450    PT Time Calculation (min)  90 min    Activity Tolerance  Patient tolerated treatment well    Behavior During Therapy  [facility] for tasks assessed/performed       Past Medical History:  Diagnosis Date  . Allergy   . Anxiety   . Arthritis   . Asthma   . Bipolar 1 disorder ([place])   . CAD (coronary artery disease)   . Cellulitis   . CHF (congestive heart failure) (HCC)    diastolic dysfunction  . Chronic back pain   . Chronic headaches   . Complication of anesthesia    States she typically gets sick s/p anesthesia  . Contusion of sacrum   . COPD (chronic obstructive pulmonary disease) ([place])   . Dementia ([place])   . Depression   . Dyspnea    PFT 03/05/09 FEV1 2.77(98%), FVC 3.25(86%), FEV1% 85, TLC 5.88(99%), DLCO 60% ,  Methacholine challenge 03/16/09 normal ,  CT chest 03/12/09 no pulmonary disease  . Fungal infection   . GERD (gastroesophageal reflux disease)   . History of colonoscopy 10/17/2002   by Dr Rehman-> distal non-specific proctitis, small ext hemorrhoids,   . HTN (hypertension)   . Hyperlipidemia   . Hyperthyroidism    radioactive - thyroid cancer   . Migraine headache   . Morbid obesity with body mass index of 50.0-59.9 in adult [facility]) JAN 2011 370 LBS   2004 311 BMI 45.9  . Myocardial infarction ([place])    NOV 1997  . OSA on CPAP    she had been on 2L O2 at night but that was stopped  . PONV (postoperative nausea  and vomiting)   . PTSD (post-traumatic stress disorder)   . PTSD (post-traumatic stress disorder)   . Suicidal ideation   . Urine incontinence   . Vitamin D deficiency     Past Surgical History:  Procedure Laterality Date  . ABDOMINAL HYSTERECTOMY     sept 1996  . APPENDECTOMY    . BACK SURGERY  2008  . CARDIAC CATHETERIZATION     nov 1997  . CHOLECYSTECTOMY    . COLONOSCOPY  10/17/2002    Distal proctitis, small external hemorrhoids, otherwise/  normal colonoscopy. Suspect rectal bleeding secondary to hemorrhoids  . ESOPHAGOGASTRODUODENOSCOPY  03/18/09   fundic gland polyps/mild gastritis  . HERNIA REPAIR  1978  . JOINT REPLACEMENT     bil knee replacement  . KNEE ARTHROSCOPY    . LEFT HEART CATH AND CORONARY ANGIOGRAPHY N/A 08/08/2018   Procedure: LEFT HEART CATH AND CORONARY ANGIOGRAPHY;  Surgeon: Jettie Booze, MD;  Location: [place] [facility];  Service: Cardiovascular;  Laterality: N/A;  . MULTIPLE EXTRACTIONS WITH ALVEOLOPLASTY N/A 08/16/2015   Procedure: EXTRACTION OF TEETH THREE, SIX, EIGHT, NINE, ELEVEN, FOURTEEN, FIFTEEN, TWENTY-EIGHT WITH ALVEOLOPLASTY;  Surgeon: Diona Browner, DDS;  Location: [place];  Service: Oral Surgery;  Laterality: N/A;  . ROTATOR CUFF REPAIR Right   . TONSILLECTOMY    . TOTAL VAGINAL HYSTERECTOMY    . TUBAL LIGATION      There were no vitals filed for this visit.  Subjective Assessment - 06/02/19 1604    Subjective  pt accompanied by her daughter today.  States she still has not called [name] back to proceed with measurements for garments.  Urged daughter to do this.    Currently in Pain?  No/denies                       [facility] Adult PT Treatment/Exercise - 06/02/19 0001      Manual Therapy   Manual Therapy  Compression Bandaging;Manual Lymphatic Drainage (MLD);Other (comment)    Manual therapy comments  completed seperate from all other aspects of treatment.     Manual Lymphatic Drainage (MLD)  To include  supraclavicular, deep and superfical abdominal, routing fluid using inguuinal axillary anastomosis and LE anteriorly with LE's elevated    Compression Bandaging  using foam and multilayer short stretch bandaging from toes  to thigh B.                PT Short Term Goals - 04/14/19 1448      PT SHORT TERM GOAL #1   Title  PT to lose 3-5 cm volume from both LE to reduce risk of cellulitis    Time  4    Period  Weeks    Status  On-going    Target Date  05/09/19      PT SHORT TERM GOAL #2   Title  PT to know the sign and sx of cellulitis and the importance of getting immediate medical attention.    Time  4    Period  Weeks    Status  On-going        PT Long Term Goals - 04/14/19 1449      PT LONG TERM GOAL #1   Title  Pt to have lost between 5-8 cm of fluid to allow pt to be able to stand for 10 minutes in order to complete self grooming activites.    Time  8    Period  Weeks    Status  On-going      PT LONG TERM GOAL #2   Title  Pt LE pain to be no greater than 2/10  to be able to walk with her cane for ten minutes to allow pt to get to her medical appointments with greater ease.    Time  8    Period  Weeks    Status  On-going      PT LONG TERM GOAL #3   Title  PT to have obtained and be using a compression pump for maintenance phase of treatment.    Time  8    Period  Weeks    Status  On-going      PT LONG TERM GOAL #4   Title  PT to have thigh high compression garments and to be wearing daily.    Time  8    Period  Weeks    Status  On-going            Plan - 06/02/19 1611    Clinical Impression Statement  continued with manual lymph drainage and bandaging to bilateral LE's.  PT with only minimla induration proximal calf  region.  No induration in thighs or ankles.  Pt is overall improving.  Urged to get on measurements for juxtas as these will also be used for reduction in place of bandaging so she can pump daily.    Personal Factors and Comorbidities   Comorbidity 3+    Comorbidities  B TKR, CAD, COPD, back surgery.    Examination-Activity Limitations  Lift;Locomotion Level;Stand;Transfers    Examination-Participation Restrictions  Community Activity;Cleaning;Laundry    Stability/Clinical Decision Making  Evolving/Moderate complexity    Rehab Potential  Good    PT Frequency  3x / week    PT Duration  8 weeks    PT Treatment/Interventions  Compression bandaging;Manual lymph drainage;Patient/family education;ADLs/Self Care Home Management    PT Next Visit Plan  Continue total decongestive techniques and  bandaging to bilateral LE's. Measure on thursdays or Fridays.    PT Home Exercise Plan  ankle pumps, LAQ, hip ab/adduction, marching and diaphragmic breathing.       Patient will benefit from skilled therapeutic intervention in order to improve the following deficits and impairments:  Difficulty walking, Increased edema, Obesity, Pain  Visit Diagnosis: Lymphedema, not elsewhere classified  Difficulty in walking, not elsewhere classified     Problem List Patient Active Problem List   Diagnosis Date Noted  . Chronic acquired lymphedema 02/07/2019  . Bilateral shoulder pain 02/07/2019  . Allergies 10/17/2018  . Memory loss 09/16/2018  . Prolonged QT interval 08/06/2018  . Chronic obstructive pulmonary disease ([place]) 05/06/2018  . Orthostatic hypotension 10/24/2017  . Fatty liver 10/24/2017  . Thrombocytopenia ([place]) 09/29/2017  . Visual hallucinations   . Bipolar I disorder, most recent episode depressed, severe w psychosis ([place]) 06/21/2016  . History of MI (myocardial infarction) 06/21/2016  . Spinal stenosis of lumbar region with neurogenic claudication 03/29/2016  . Severe obesity (BMI >= 40) ([place]) 07/05/2015  . Hyperlipidemia LDL goal <130 07/05/2015  . MDD (major depressive disorder), single episode, severe with psychotic features ([place]) 01/21/2015  . Bilateral knee pain 01/07/2014  . Scar condition and fibrosis of skin  12/11/2013  . Essential hypertension, benign 07/01/2013  . OSA on CPAP 11/04/2012  . Back pain, chronic 08/27/2012  . Vitamin D insufficiency 04/04/2012  . Insomnia due to mental disorder 04/04/2012  . Anxiety 04/04/2012  . OCD (obsessive compulsive disorder) 04/04/2012  . PTSD (post-traumatic stress disorder) 06/28/2011  . Coronary artery disease 08/26/2010  . GERD 03/01/2009  . Morbid obesity ([place]) 02/15/2009  . Hypothyroidism 02/12/2009   Teena Irani, PTA/CLT [phone]  Teena Irani 06/02/2019, [place] [address] [place], [place], [address] Phone: [phone]   Fax:  [phone]  Name: NOHA SHIPPS MRN: [id number] Date of Birth: 26-Nov-1955

## 2019-06-04 ENCOUNTER — Other Ambulatory Visit: Payer: Self-pay | Admitting: Family Medicine

## 2019-06-04 ENCOUNTER — Ambulatory Visit (HOSPITAL_COMMUNITY): Payer: Medicare Other | Admitting: Physical Therapy

## 2019-06-04 DIAGNOSIS — E559 Vitamin D deficiency, unspecified: Secondary | ICD-10-CM

## 2019-06-06 ENCOUNTER — Other Ambulatory Visit: Payer: Self-pay | Admitting: Family Medicine

## 2019-06-06 ENCOUNTER — Ambulatory Visit (HOSPITAL_COMMUNITY): Payer: Medicare Other | Admitting: Physical Therapy

## 2019-06-06 ENCOUNTER — Other Ambulatory Visit: Payer: Self-pay

## 2019-06-06 DIAGNOSIS — I890 Lymphedema, not elsewhere classified: Secondary | ICD-10-CM

## 2019-06-06 DIAGNOSIS — M79604 Pain in right leg: Secondary | ICD-10-CM

## 2019-06-06 DIAGNOSIS — M79605 Pain in left leg: Secondary | ICD-10-CM

## 2019-06-06 DIAGNOSIS — R262 Difficulty in walking, not elsewhere classified: Secondary | ICD-10-CM

## 2019-06-06 NOTE — Therapy (Signed)
[place], [place], [address] Phone: [phone]   Fax:  [phone]  Physical Therapy Treatment  Patient Details  Name: Allison Sharp MRN: [id number] Date of Birth: April 29, 1956 Referring Provider (PT): Ronnald Collum    Encounter Date: 06/06/2019  PT End of Session - 06/06/19 1456    Visit Number  16    Number of Visits  24    Date for PT Re-Evaluation  06/06/19    Authorization - Visit Number  7    Authorization - Number of Visits  10    PT Start Time  1320    PT Stop Time  1448    PT Time Calculation (min)  88 min    Activity Tolerance  Patient tolerated treatment well    Behavior During Therapy  [facility] for tasks assessed/performed       Past Medical History:  Diagnosis Date  . Allergy   . Anxiety   . Arthritis   . Asthma   . Bipolar 1 disorder ([place])   . CAD (coronary artery disease)   . Cellulitis   . CHF (congestive heart failure) (HCC)    diastolic dysfunction  . Chronic back pain   . Chronic headaches   . Complication of anesthesia    States she typically gets sick s/p anesthesia  . Contusion of sacrum   . COPD (chronic obstructive pulmonary disease) ([place])   . Dementia ([place])   . Depression   . Dyspnea    PFT 03/05/09 FEV1 2.77(98%), FVC 3.25(86%), FEV1% 85, TLC 5.88(99%), DLCO 60% ,  Methacholine challenge 03/16/09 normal ,  CT chest 03/12/09 no pulmonary disease  . Fungal infection   . GERD (gastroesophageal reflux disease)   . History of colonoscopy 10/17/2002   by Dr Rehman-> distal non-specific proctitis, small ext hemorrhoids,   . HTN (hypertension)   . Hyperlipidemia   . Hyperthyroidism    radioactive - thyroid cancer   . Migraine headache   . Morbid obesity with body mass index of 50.0-59.9 in adult [facility]) JAN 2011 370 LBS   2004 311 BMI 45.9  . Myocardial infarction ([place])    NOV 1997  . OSA on CPAP    she had been on 2L O2 at night but that was stopped  . PONV (postoperative nausea  and vomiting)   . PTSD (post-traumatic stress disorder)   . PTSD (post-traumatic stress disorder)   . Suicidal ideation   . Urine incontinence   . Vitamin D deficiency     Past Surgical History:  Procedure Laterality Date  . ABDOMINAL HYSTERECTOMY     sept 1996  . APPENDECTOMY    . BACK SURGERY  2008  . CARDIAC CATHETERIZATION     nov 1997  . CHOLECYSTECTOMY    . COLONOSCOPY  10/17/2002    Distal proctitis, small external hemorrhoids, otherwise/  normal colonoscopy. Suspect rectal bleeding secondary to hemorrhoids  . ESOPHAGOGASTRODUODENOSCOPY  03/18/09   fundic gland polyps/mild gastritis  . HERNIA REPAIR  1978  . JOINT REPLACEMENT     bil knee replacement  . KNEE ARTHROSCOPY    . LEFT HEART CATH AND CORONARY ANGIOGRAPHY N/A 08/08/2018   Procedure: LEFT HEART CATH AND CORONARY ANGIOGRAPHY;  Surgeon: Jettie Booze, MD;  Location: [place] [facility];  Service: Cardiovascular;  Laterality: N/A;  . MULTIPLE EXTRACTIONS WITH ALVEOLOPLASTY N/A 08/16/2015   Procedure: EXTRACTION OF TEETH THREE, SIX, EIGHT, NINE, ELEVEN, FOURTEEN, FIFTEEN, TWENTY-EIGHT WITH ALVEOLOPLASTY;  Surgeon: Diona Browner, DDS;  Location: [place];  Service: Oral Surgery;  Laterality: N/A;  . ROTATOR CUFF REPAIR Right   . TONSILLECTOMY    . TOTAL VAGINAL HYSTERECTOMY    . TUBAL LIGATION      There were no vitals filed for this visit.                    [facility] Adult PT Treatment/Exercise - 06/06/19 0001      Manual Therapy   Manual Therapy  Compression Bandaging;Manual Lymphatic Drainage (MLD);Other (comment)    Manual therapy comments  completed seperate from all other aspects of treatment.     Manual Lymphatic Drainage (MLD)  To include supraclavicular, deep and superfical abdominal, routing fluid using inguuinal axillary anastomosis and LE anteriorly with LE's elevated    Compression Bandaging  using foam and multilayer short stretch bandaging from toes  to thigh B.                 PT Short Term Goals - 04/14/19 1448      PT SHORT TERM GOAL #1   Title  PT to lose 3-5 cm volume from both LE to reduce risk of cellulitis    Time  4    Period  Weeks    Status  On-going    Target Date  05/09/19      PT SHORT TERM GOAL #2   Title  PT to know the sign and sx of cellulitis and the importance of getting immediate medical attention.    Time  4    Period  Weeks    Status  On-going        PT Long Term Goals - 04/14/19 1449      PT LONG TERM GOAL #1   Title  Pt to have lost between 5-8 cm of fluid to allow pt to be able to stand for 10 minutes in order to complete self grooming activites.    Time  8    Period  Weeks    Status  On-going      PT LONG TERM GOAL #2   Title  Pt LE pain to be no greater than 2/10  to be able to walk with her cane for ten minutes to allow pt to get to her medical appointments with greater ease.    Time  8    Period  Weeks    Status  On-going      PT LONG TERM GOAL #3   Title  PT to have obtained and be using a compression pump for maintenance phase of treatment.    Time  8    Period  Weeks    Status  On-going      PT LONG TERM GOAL #4   Title  PT to have thigh high compression garments and to be wearing daily.    Time  8    Period  Weeks    Status  On-going            Plan - 06/06/19 1456    Clinical Impression Statement  continued per POC.  Pt leaving to go out of town for the weekend but plans on keeping her bandaging on until Sunday.  Pt reported overall comfort with bandaging at EOS. CG reports she stil has not called to get appointment and therapist urged to do so.  Therapist also sent text to [facility] regarding the need for pateint to get measured and what product she  is needing.    Personal Factors and Comorbidities  Comorbidity 3+    Comorbidities  B TKR, CAD, COPD, back surgery.    Examination-Activity Limitations  Lift;Locomotion Level;Stand;Transfers    Examination-Participation Restrictions   Community Activity;Cleaning;Laundry    Stability/Clinical Decision Making  Evolving/Moderate complexity    Rehab Potential  Good    PT Frequency  3x / week    PT Duration  8 weeks    PT Treatment/Interventions  Compression bandaging;Manual lymph drainage;Patient/family education;ADLs/Self Care Home Management    PT Next Visit Plan  Continue total decongestive techniques and  bandaging to bilateral LE's. Measure on thursdays or Fridays.    PT Home Exercise Plan  ankle pumps, LAQ, hip ab/adduction, marching and diaphragmic breathing.       Patient will benefit from skilled therapeutic intervention in order to improve the following deficits and impairments:  Difficulty walking, Increased edema, Obesity, Pain  Visit Diagnosis: Lymphedema, not elsewhere classified  Difficulty in walking, not elsewhere classified  Pain in left leg  Pain in right leg     Problem List Patient Active Problem List   Diagnosis Date Noted  . Chronic acquired lymphedema 02/07/2019  . Bilateral shoulder pain 02/07/2019  . Allergies 10/17/2018  . Memory loss 09/16/2018  . Prolonged QT interval 08/06/2018  . Chronic obstructive pulmonary disease ([place]) 05/06/2018  . Orthostatic hypotension 10/24/2017  . Fatty liver 10/24/2017  . Thrombocytopenia ([place]) 09/29/2017  . Visual hallucinations   . Bipolar I disorder, most recent episode depressed, severe w psychosis ([place]) 06/21/2016  . History of MI (myocardial infarction) 06/21/2016  . Spinal stenosis of lumbar region with neurogenic claudication 03/29/2016  . Severe obesity (BMI >= 40) ([place]) 07/05/2015  . Hyperlipidemia LDL goal <130 07/05/2015  . MDD (major depressive disorder), single episode, severe with psychotic features ([place]) 01/21/2015  . Bilateral knee pain 01/07/2014  . Scar condition and fibrosis of skin 12/11/2013  . Essential hypertension, benign 07/01/2013  . OSA on CPAP 11/04/2012  . Back pain, chronic 08/27/2012  . Vitamin D insufficiency  04/04/2012  . Insomnia due to mental disorder 04/04/2012  . Anxiety 04/04/2012  . OCD (obsessive compulsive disorder) 04/04/2012  . PTSD (post-traumatic stress disorder) 06/28/2011  . Coronary artery disease 08/26/2010  . GERD 03/01/2009  . Morbid obesity ([place]) 02/15/2009  . Hypothyroidism 02/12/2009   Teena Irani, PTA/CLT [phone]  Teena Irani 06/06/2019, 3:02 PM  [facility] [address] [place], [place], [address] Phone: [phone]   Fax:  [phone]  Name: CANDIE BUSAM MRN: [id number] Date of Birth: [birth year]

## 2019-06-09 ENCOUNTER — Ambulatory Visit (HOSPITAL_COMMUNITY): Payer: Medicare Other | Admitting: Physical Therapy

## 2019-06-09 ENCOUNTER — Telehealth (HOSPITAL_COMMUNITY): Payer: Self-pay | Admitting: Physical Therapy

## 2019-06-09 NOTE — Telephone Encounter (Signed)
Caregiver and pt have been exposed to someone with Covid -They will be tested and let us know about test results.

## 2019-06-11 ENCOUNTER — Ambulatory Visit (HOSPITAL_COMMUNITY): Payer: Medicare Other | Admitting: Physical Therapy

## 2019-06-13 ENCOUNTER — Encounter (HOSPITAL_COMMUNITY): Payer: Medicare Other | Admitting: Physical Therapy

## 2019-06-16 ENCOUNTER — Ambulatory Visit (HOSPITAL_COMMUNITY): Payer: Medicare Other | Admitting: Physical Therapy

## 2019-06-16 ENCOUNTER — Telehealth (HOSPITAL_COMMUNITY): Payer: Self-pay | Admitting: Physical Therapy

## 2019-06-16 NOTE — Telephone Encounter (Signed)
pt's dtr called to cancel today's appt due to the pt is not feeling well. tHis pt will need to bring in a negative covid test form

## 2019-06-17 ENCOUNTER — Other Ambulatory Visit: Payer: Self-pay | Admitting: Family Medicine

## 2019-06-17 DIAGNOSIS — F411 Generalized anxiety disorder: Secondary | ICD-10-CM

## 2019-06-17 DIAGNOSIS — F3160 Bipolar disorder, current episode mixed, unspecified: Secondary | ICD-10-CM

## 2019-06-18 ENCOUNTER — Other Ambulatory Visit: Payer: Self-pay

## 2019-06-18 ENCOUNTER — Other Ambulatory Visit: Payer: Self-pay | Admitting: Family Medicine

## 2019-06-18 ENCOUNTER — Ambulatory Visit (HOSPITAL_COMMUNITY): Payer: Medicare Other | Admitting: Physical Therapy

## 2019-06-18 DIAGNOSIS — R262 Difficulty in walking, not elsewhere classified: Secondary | ICD-10-CM

## 2019-06-18 DIAGNOSIS — M79605 Pain in left leg: Secondary | ICD-10-CM

## 2019-06-18 DIAGNOSIS — I890 Lymphedema, not elsewhere classified: Secondary | ICD-10-CM

## 2019-06-18 NOTE — Therapy (Signed)
[place], [place], [address] Phone: [phone]   Fax:  [phone]  Physical Therapy Treatment  Patient Details  Name: Allison Sharp MRN: [id number] Date of Birth: Nov 21, 1955 Referring Provider (PT): Ronnald Collum    Encounter Date: 06/18/2019  PT End of Session - 06/18/19 1638    Visit Number  17    Number of Visits  24    Date for PT Re-Evaluation  06/06/19    Authorization - Visit Number  8    Authorization - Number of Visits  10    PT Start Time  1320    PT Stop Time  1445    PT Time Calculation (min)  85 min    Activity Tolerance  Patient tolerated treatment well    Behavior During Therapy  [facility] for tasks assessed/performed       Past Medical History:  Diagnosis Date  . Allergy   . Anxiety   . Arthritis   . Asthma   . Bipolar 1 disorder ([place])   . CAD (coronary artery disease)   . Cellulitis   . CHF (congestive heart failure) (HCC)    diastolic dysfunction  . Chronic back pain   . Chronic headaches   . Complication of anesthesia    States she typically gets sick s/p anesthesia  . Contusion of sacrum   . COPD (chronic obstructive pulmonary disease) ([place])   . Dementia ([place])   . Depression   . Dyspnea    PFT 03/05/09 FEV1 2.77(98%), FVC 3.25(86%), FEV1% 85, TLC 5.88(99%), DLCO 60% ,  Methacholine challenge 03/16/09 normal ,  CT chest 03/12/09 no pulmonary disease  . Fungal infection   . GERD (gastroesophageal reflux disease)   . History of colonoscopy 10/17/2002   by Dr Rehman-> distal non-specific proctitis, small ext hemorrhoids,   . HTN (hypertension)   . Hyperlipidemia   . Hyperthyroidism    radioactive - thyroid cancer   . Migraine headache   . Morbid obesity with body mass index of 50.0-59.9 in adult [facility]) JAN 2011 370 LBS   2004 311 BMI 45.9  . Myocardial infarction ([place])    NOV 1997  . OSA on CPAP    she had been on 2L O2 at night but that was stopped  . PONV (postoperative  nausea and vomiting)   . PTSD (post-traumatic stress disorder)   . PTSD (post-traumatic stress disorder)   . Suicidal ideation   . Urine incontinence   . Vitamin D deficiency     Past Surgical History:  Procedure Laterality Date  . ABDOMINAL HYSTERECTOMY     sept 1996  . APPENDECTOMY    . BACK SURGERY  2008  . CARDIAC CATHETERIZATION     nov 1997  . CHOLECYSTECTOMY    . COLONOSCOPY  10/17/2002    Distal proctitis, small external hemorrhoids, otherwise/  normal colonoscopy. Suspect rectal bleeding secondary to hemorrhoids  . ESOPHAGOGASTRODUODENOSCOPY  03/18/09   fundic gland polyps/mild gastritis  . HERNIA REPAIR  1978  . JOINT REPLACEMENT     bil knee replacement  . KNEE ARTHROSCOPY    . LEFT HEART CATH AND CORONARY ANGIOGRAPHY N/A 08/08/2018   Procedure: LEFT HEART CATH AND CORONARY ANGIOGRAPHY;  Surgeon: Jettie Booze, MD;  Location: [place] [facility];  Service: Cardiovascular;  Laterality: N/A;  . MULTIPLE EXTRACTIONS WITH ALVEOLOPLASTY N/A 08/16/2015   Procedure: EXTRACTION OF TEETH THREE, SIX, EIGHT, NINE, ELEVEN, FOURTEEN, FIFTEEN, TWENTY-EIGHT WITH ALVEOLOPLASTY;  Surgeon: Diona Browner, DDS;  Location: [place];  Service: Oral Surgery;  Laterality: N/A;  . ROTATOR CUFF REPAIR Right   . TONSILLECTOMY    . TOTAL VAGINAL HYSTERECTOMY    . TUBAL LIGATION      There were no vitals filed for this visit.  Subjective Assessment - 06/18/19 1636    Subjective  pt returns today after missing 2 weeks.  STates her CG was exposted to a co-worker with covid and had to get tested/quarantine and because she lives with her did not want to expose the clinic.  Pt states she used her pump daily but did not have any compression on her LE's during this time.    Currently in Pain?  Yes                       [facility] Adult PT Treatment/Exercise - 06/18/19 0001      Manual Therapy   Manual Therapy  Compression Bandaging;Manual Lymphatic Drainage (MLD);Other (comment)     Manual therapy comments  completed seperate from all other aspects of treatment.     Manual Lymphatic Drainage (MLD)  To include supraclavicular, deep and superfical abdominal, routing fluid using inguuinal axillary anastomosis and LE anteriorly with LE's elevated    Compression Bandaging  using foam and multilayer short stretch bandaging from toes  to thigh B.                PT Short Term Goals - 04/14/19 1448      PT SHORT TERM GOAL #1   Title  PT to lose 3-5 cm volume from both LE to reduce risk of cellulitis    Time  4    Period  Weeks    Status  On-going    Target Date  05/09/19      PT SHORT TERM GOAL #2   Title  PT to know the sign and sx of cellulitis and the importance of getting immediate medical attention.    Time  4    Period  Weeks    Status  On-going        PT Long Term Goals - 04/14/19 1449      PT LONG TERM GOAL #1   Title  Pt to have lost between 5-8 cm of fluid to allow pt to be able to stand for 10 minutes in order to complete self grooming activites.    Time  8    Period  Weeks    Status  On-going      PT LONG TERM GOAL #2   Title  Pt LE pain to be no greater than 2/10  to be able to walk with her cane for ten minutes to allow pt to get to her medical appointments with greater ease.    Time  8    Period  Weeks    Status  On-going      PT LONG TERM GOAL #3   Title  PT to have obtained and be using a compression pump for maintenance phase of treatment.    Time  8    Period  Weeks    Status  On-going      PT LONG TERM GOAL #4   Title  PT to have thigh high compression garments and to be wearing daily.    Time  8    Period  Weeks    Status  On-going  Plan - 06/18/19 1642    Clinical Impression Statement  pt not measured today as her volumes are up from not wearing compression/missing last 2 weeks.  REsumed manual lymph drainage and compression bandingg.  Pt has still not followed up with [facility] and urged to do so.     Personal Factors and Comorbidities  Comorbidity 3+    Comorbidities  B TKR, CAD, COPD, back surgery.    Examination-Activity Limitations  Lift;Locomotion Level;Stand;Transfers    Examination-Participation Restrictions  Community Activity;Cleaning;Laundry    Stability/Clinical Decision Making  Evolving/Moderate complexity    Rehab Potential  Good    PT Frequency  3x / week    PT Duration  8 weeks    PT Treatment/Interventions  Compression bandaging;Manual lymph drainage;Patient/family education;ADLs/Self Care Home Management    PT Next Visit Plan  Continue total decongestive techniques and  bandaging to bilateral LE's. Measure on thursdays or Fridays.    PT Home Exercise Plan  ankle pumps, LAQ, hip ab/adduction, marching and diaphragmic breathing.       Patient will benefit from skilled therapeutic intervention in order to improve the following deficits and impairments:  Difficulty walking, Increased edema, Obesity, Pain  Visit Diagnosis: Pain in left leg  Lymphedema, not elsewhere classified  Difficulty in walking, not elsewhere classified     Problem List Patient Active Problem List   Diagnosis Date Noted  . Chronic acquired lymphedema 02/07/2019  . Bilateral shoulder pain 02/07/2019  . Allergies 10/17/2018  . Memory loss 09/16/2018  . Prolonged QT interval 08/06/2018  . Chronic obstructive pulmonary disease ([place]) 05/06/2018  . Orthostatic hypotension 10/24/2017  . Fatty liver 10/24/2017  . Thrombocytopenia ([place]) 09/29/2017  . Visual hallucinations   . Bipolar I disorder, most recent episode depressed, severe w psychosis ([place]) 06/21/2016  . History of MI (myocardial infarction) 06/21/2016  . Spinal stenosis of lumbar region with neurogenic claudication 03/29/2016  . Severe obesity (BMI >= 40) ([place]) 07/05/2015  . Hyperlipidemia LDL goal <130 07/05/2015  . MDD (major depressive disorder), single episode, severe with psychotic features ([place]) 01/21/2015  . Bilateral knee  pain 01/07/2014  . Scar condition and fibrosis of skin 12/11/2013  . Essential hypertension, benign 07/01/2013  . OSA on CPAP 11/04/2012  . Back pain, chronic 08/27/2012  . Vitamin D insufficiency 04/04/2012  . Insomnia due to mental disorder 04/04/2012  . Anxiety 04/04/2012  . OCD (obsessive compulsive disorder) 04/04/2012  . PTSD (post-traumatic stress disorder) 06/28/2011  . Coronary artery disease 08/26/2010  . GERD 03/01/2009  . Morbid obesity ([place]) 02/15/2009  . Hypothyroidism 02/12/2009   Teena Irani, PTA/CLT [phone]  Teena Irani 06/18/2019, 4:45 PM  [facility] [address] [place], [place], [address] Phone: [phone]   Fax:  [phone]  Name: Allison Sharp MRN: [id number] Date of Birth: [birth year]

## 2019-06-19 NOTE — Telephone Encounter (Signed)
Last seen Dr Warrick Parisian 05/02/19 for chronic follow up

## 2019-06-20 ENCOUNTER — Ambulatory Visit (HOSPITAL_COMMUNITY): Payer: Medicare Other | Admitting: Physical Therapy

## 2019-06-20 ENCOUNTER — Telehealth (HOSPITAL_COMMUNITY): Payer: Self-pay | Admitting: Physical Therapy

## 2019-06-20 NOTE — Telephone Encounter (Signed)
pt called to cancel appt for today, no reason given

## 2019-06-20 NOTE — Addendum Note (Signed)
Addended by: Leeroy Cha on: 06/20/2019 04:39 PM   Modules accepted: Orders

## 2019-06-23 ENCOUNTER — Telehealth (HOSPITAL_COMMUNITY): Payer: Self-pay | Admitting: Physical Therapy

## 2019-06-23 ENCOUNTER — Ambulatory Visit (HOSPITAL_COMMUNITY): Payer: Medicare Other | Admitting: Physical Therapy

## 2019-06-23 NOTE — Telephone Encounter (Signed)
pt cancelled appt for today because she has a headache

## 2019-06-25 ENCOUNTER — Ambulatory Visit (HOSPITAL_COMMUNITY): Payer: Medicare Other | Admitting: Physical Therapy

## 2019-06-25 ENCOUNTER — Telehealth (HOSPITAL_COMMUNITY): Payer: Self-pay | Admitting: Physical Therapy

## 2019-06-25 NOTE — Telephone Encounter (Signed)
CALLED TO CANCEL THE APPT FOR TODAY DUE TO THE THERAPIST IS OUT OF THE OFFICE

## 2019-06-27 ENCOUNTER — Other Ambulatory Visit: Payer: Self-pay

## 2019-06-27 ENCOUNTER — Encounter (HOSPITAL_COMMUNITY): Payer: Self-pay | Admitting: Physical Therapy

## 2019-06-27 ENCOUNTER — Ambulatory Visit (HOSPITAL_COMMUNITY): Payer: Medicare Other | Admitting: Physical Therapy

## 2019-06-27 DIAGNOSIS — I890 Lymphedema, not elsewhere classified: Secondary | ICD-10-CM | POA: Diagnosis not present

## 2019-06-27 DIAGNOSIS — R262 Difficulty in walking, not elsewhere classified: Secondary | ICD-10-CM

## 2019-06-27 DIAGNOSIS — M79605 Pain in left leg: Secondary | ICD-10-CM

## 2019-06-27 NOTE — Therapy (Signed)
[facility] [place], [place], [address] Phone: [phone]   Fax:  [phone]  Physical Therapy Treatment  Patient Details  Name: Allison Sharp MRN: [id number] Date of Birth: July 25, 1955 Referring Provider (PT): Ronnald Collum    Encounter Date: 06/27/2019  PT End of Session - 06/27/19 1633    Visit Number  18    Number of Visits  24    Date for PT Re-Evaluation  07/17/18    Authorization - Visit Number  9    Authorization - Number of Visits  10    PT Start Time  [id number]    PT Stop Time  1535    PT Time Calculation (min)  90 min    Activity Tolerance  Patient tolerated treatment well    Behavior During Therapy  [facility] for tasks assessed/performed       Past Medical History:  Diagnosis Date  . Allergy   . Anxiety   . Arthritis   . Asthma   . Bipolar 1 disorder ([place])   . CAD (coronary artery disease)   . Cellulitis   . CHF (congestive heart failure) (HCC)    diastolic dysfunction  . Chronic back pain   . Chronic headaches   . Complication of anesthesia    States she typically gets sick s/p anesthesia  . Contusion of sacrum   . COPD (chronic obstructive pulmonary disease) ([place])   . Dementia ([place])   . Depression   . Dyspnea    PFT 03/05/09 FEV1 2.77(98%), FVC 3.25(86%), FEV1% 85, TLC 5.88(99%), DLCO 60% ,  Methacholine challenge 03/16/09 normal ,  CT chest 03/12/09 no pulmonary disease  . Fungal infection   . GERD (gastroesophageal reflux disease)   . History of colonoscopy 10/17/2002   by Dr Rehman-> distal non-specific proctitis, small ext hemorrhoids,   . HTN (hypertension)   . Hyperlipidemia   . Hyperthyroidism    radioactive - thyroid cancer   . Migraine headache   . Morbid obesity with body mass index of 50.0-59.9 in adult [facility]) JAN 2011 370 LBS   2004 311 BMI 45.9  . Myocardial infarction ([place])    NOV 1997  . OSA on CPAP    she had been on 2L O2 at night but that was stopped  . PONV (postoperative  nausea and vomiting)   . PTSD (post-traumatic stress disorder)   . PTSD (post-traumatic stress disorder)   . Suicidal ideation   . Urine incontinence   . Vitamin D deficiency     Past Surgical History:  Procedure Laterality Date  . ABDOMINAL HYSTERECTOMY     sept 1996  . APPENDECTOMY    . BACK SURGERY  2008  . CARDIAC CATHETERIZATION     nov 1997  . CHOLECYSTECTOMY    . COLONOSCOPY  10/17/2002    Distal proctitis, small external hemorrhoids, otherwise/  normal colonoscopy. Suspect rectal bleeding secondary to hemorrhoids  . ESOPHAGOGASTRODUODENOSCOPY  03/18/09   fundic gland polyps/mild gastritis  . HERNIA REPAIR  1978  . JOINT REPLACEMENT     bil knee replacement  . KNEE ARTHROSCOPY    . LEFT HEART CATH AND CORONARY ANGIOGRAPHY N/A 08/08/2018   Procedure: LEFT HEART CATH AND CORONARY ANGIOGRAPHY;  Surgeon: Jettie Booze, MD;  Location: [facility];  Service: Cardiovascular;  Laterality: N/A;  . MULTIPLE EXTRACTIONS WITH ALVEOLOPLASTY N/A 08/16/2015   Procedure: EXTRACTION OF TEETH THREE, SIX, EIGHT, NINE, ELEVEN, FOURTEEN, FIFTEEN, TWENTY-EIGHT WITH ALVEOLOPLASTY;  Surgeon: Diona Browner, DDS;  Location: [facility];  Service: Oral Surgery;  Laterality: N/A;  . ROTATOR CUFF REPAIR Right   . TONSILLECTOMY    . TOTAL VAGINAL HYSTERECTOMY    . TUBAL LIGATION      There were no vitals filed for this visit.  Subjective Assessment - 06/27/19 1626    Subjective  Pt was seen on 1/20 after missing 2 weeks due to COVID exposure.  Pt has not been back since 1/20 due to transportation issues.  Thus, pt has been seen 2 x in 23 days.  Therapist and pt discussed that even coming one time a week would not achieve decongestion.  Pt verbalized understanding and is attempting to secure transportation.  Pt states that [name] was suppose to be at clinic today to measure for juxtafit.  Therapist stated that pt family member needs to call and make a new appointment.  Pt is only exercising once a  week therapist encouraged pt to be completing her exercises daily.  Pt is pumping approximately two times a day.    Pertinent History  CAD , COPD    How long can you stand comfortably?  2 minutes    How long can you walk comfortably?  137ft uses a cane and O2    Patient Stated Goals  decreaed edema, to be able to walk and stand longer    Currently in Pain?  No/denies    Pain Onset  More than a month ago            LYMPHEDEMA/ONCOLOGY QUESTIONNAIRE - 06/27/19 1401      Right Lower Extremity Lymphedema   20 cm Proximal to Suprapatella  92 cm  (Pended)     10 cm Proximal to Suprapatella  88.5 cm  (Pended)     At Midpatella/Popliteal Crease  65 cm  (Pended)     30 cm Proximal to Floor at Lateral Plantar Foot  65 cm  (Pended)     20 cm Proximal to Floor at Lateral Plantar Foot  59.8 1  (Pended)     10 cm Proximal to Floor at Lateral Malleoli  43 cm  (Pended)     Circumference of ankle/heel  37.4 cm.  (Pended)     5 cm Proximal to 1st MTP Joint  26.8 cm  (Pended)     Across MTP Joint  25.9 cm  (Pended)       Left Lower Extremity Lymphedema   20 cm Proximal to Suprapatella  90 cm  (Pended)     10 cm Proximal to Suprapatella  84.5 cm  (Pended)     30 cm Proximal to Floor at Lateral Plantar Foot  63.3 cm  (Pended)     20 cm Proximal to Floor at Lateral Plantar Foot  55 cm  (Pended)     10 cm Proximal to Floor at Lateral Malleoli  42.5 cm  (Pended)     Circumference of ankle/heel  40.8 cm.  (Pended)     5 cm Proximal to 1st MTP Joint  27 cm  (Pended)     Across MTP Joint  27.5 cm  (Pended)                 [facility] Adult PT Treatment/Exercise - 06/27/19 0001      Manual Therapy   Manual Therapy  Compression Bandaging;Manual Lymphatic Drainage (MLD);Other (comment)    Manual therapy comments  completed seperate from all other aspects of treatment.  Manual Lymphatic Drainage (MLD)  To include supraclavicular, deep and superfical abdominal, routing fluid using inguuinal  axillary anastomosis and LE anteriorly with LE's elevated    Compression Bandaging  using foam and multilayer short stretch bandaging from toes  to thigh B.     Other Manual Therapy  measurement               PT Short Term Goals - 04/14/19 1448      PT SHORT TERM GOAL #1   Title  PT to lose 3-5 cm volume from both LE to reduce risk of cellulitis    Time  4    Period  Weeks    Status  On-going    Target Date  05/09/19      PT SHORT TERM GOAL #2   Title  PT to know the sign and sx of cellulitis and the importance of getting immediate medical attention.    Time  4    Period  Weeks    Status  On-going        PT Long Term Goals - 04/14/19 1449      PT LONG TERM GOAL #1   Title  Pt to have lost between 5-8 cm of fluid to allow pt to be able to stand for 10 minutes in order to complete self grooming activites.    Time  8    Period  Weeks    Status  On-going      PT LONG TERM GOAL #2   Title  Pt LE pain to be no greater than 2/10  to be able to walk with her cane for ten minutes to allow pt to get to her medical appointments with greater ease.    Time  8    Period  Weeks    Status  On-going      PT LONG TERM GOAL #3   Title  PT to have obtained and be using a compression pump for maintenance phase of treatment.    Time  8    Period  Weeks    Status  On-going      PT LONG TERM GOAL #4   Title  PT to have thigh high compression garments and to be wearing daily.    Time  8    Period  Weeks    Status  On-going            Plan - 06/27/19 1634    Clinical Impression Statement  Pt measured today with slight decrease in thigh area but increase in LE.  This is to be expected with pt being seen 2 x since 1/4.  Pt will be remeasured next week and see if volumes have decreased or if pt is stabilizing.  Pt to be measured for Juxtafit ASAP    Personal Factors and Comorbidities  Comorbidity 3+    Comorbidities  B TKR, CAD, COPD, back surgery.    Examination-Activity  Limitations  Lift;Locomotion Level;Stand;Transfers    Examination-Participation Restrictions  Community Activity;Cleaning;Laundry    Stability/Clinical Decision Making  Evolving/Moderate complexity    Rehab Potential  Good    PT Frequency  3x / week    PT Duration  8 weeks    PT Treatment/Interventions  Compression bandaging;Manual lymph drainage;Patient/family education;ADLs/Self Care Home Management    PT Next Visit Plan  Continue total decongestive techniques until pt has recieved juxtafits.    PT Home Exercise Plan  ankle pumps, LAQ, hip ab/adduction, marching and diaphragmic breathing.  Patient will benefit from skilled therapeutic intervention in order to improve the following deficits and impairments:  Difficulty walking, Increased edema, Obesity, Pain  Visit Diagnosis: Pain in left leg  Difficulty in walking, not elsewhere classified  Lymphedema, not elsewhere classified     Problem List Patient Active Problem List   Diagnosis Date Noted  . Chronic acquired lymphedema 02/07/2019  . Bilateral shoulder pain 02/07/2019  . Allergies 10/17/2018  . Memory loss 09/16/2018  . Prolonged QT interval 08/06/2018  . Chronic obstructive pulmonary disease ([place]) 05/06/2018  . Orthostatic hypotension 10/24/2017  . Fatty liver 10/24/2017  . Thrombocytopenia ([place]) 09/29/2017  . Visual hallucinations   . Bipolar I disorder, most recent episode depressed, severe w psychosis ([place]) 06/21/2016  . History of MI (myocardial infarction) 06/21/2016  . Spinal stenosis of lumbar region with neurogenic claudication 03/29/2016  . Severe obesity (BMI >= 40) ([place]) 07/05/2015  . Hyperlipidemia LDL goal <130 07/05/2015  . MDD (major depressive disorder), single episode, severe with psychotic features ([place]) 01/21/2015  . Bilateral knee pain 01/07/2014  . Scar condition and fibrosis of skin 12/11/2013  . Essential hypertension, benign 07/01/2013  . OSA on CPAP 11/04/2012  . Back pain, chronic  08/27/2012  . Vitamin D insufficiency 04/04/2012  . Insomnia due to mental disorder 04/04/2012  . Anxiety 04/04/2012  . OCD (obsessive compulsive disorder) 04/04/2012  . PTSD (post-traumatic stress disorder) 06/28/2011  . Coronary artery disease 08/26/2010  . GERD 03/01/2009  . Morbid obesity ([place]) 02/15/2009  . Hypothyroidism 02/12/2009    [name], PT CLT [phone] 06/27/2019, 4:37 PM  [facility] [address] [place], [place], [address] Phone: [phone]   Fax:  [phone]  Name: JOYEL PILTZ MRN: [id number] Date of Birth: 08-31-1955

## 2019-06-30 ENCOUNTER — Other Ambulatory Visit: Payer: Self-pay | Admitting: Family Medicine

## 2019-07-01 ENCOUNTER — Other Ambulatory Visit: Payer: Self-pay | Admitting: Family Medicine

## 2019-07-09 ENCOUNTER — Other Ambulatory Visit: Payer: Self-pay

## 2019-07-09 ENCOUNTER — Ambulatory Visit (HOSPITAL_COMMUNITY): Payer: Medicare Other | Admitting: Psychiatry

## 2019-07-10 ENCOUNTER — Ambulatory Visit (INDEPENDENT_AMBULATORY_CARE_PROVIDER_SITE_OTHER): Payer: Medicare Other | Admitting: Psychiatry

## 2019-07-10 ENCOUNTER — Encounter (HOSPITAL_COMMUNITY): Payer: Self-pay | Admitting: Psychiatry

## 2019-07-10 ENCOUNTER — Other Ambulatory Visit: Payer: Self-pay

## 2019-07-10 DIAGNOSIS — F411 Generalized anxiety disorder: Secondary | ICD-10-CM | POA: Diagnosis not present

## 2019-07-10 DIAGNOSIS — F3160 Bipolar disorder, current episode mixed, unspecified: Secondary | ICD-10-CM | POA: Diagnosis not present

## 2019-07-10 MED ORDER — BENZTROPINE MESYLATE 1 MG PO TABS: 1.0000 mg | ORAL_TABLET | Freq: Every day | ORAL | 1 refills | Status: DC

## 2019-07-10 MED ORDER — DULOXETINE HCL 60 MG PO CPEP: 60.0000 mg | ORAL_CAPSULE | Freq: Every day | ORAL | 1 refills | Status: DC

## 2019-07-10 MED ORDER — DIVALPROEX SODIUM 500 MG PO DR TAB: DELAYED_RELEASE_TABLET | ORAL | 0 refills | Status: DC

## 2019-07-10 MED ORDER — TRAZODONE HCL 50 MG PO TABS: ORAL_TABLET | ORAL | 1 refills | Status: DC

## 2019-07-10 MED ORDER — RISPERIDONE 2 MG PO TABS: 2.0000 mg | ORAL_TABLET | Freq: Two times a day (BID) | ORAL | 1 refills | Status: DC

## 2019-07-10 NOTE — Progress Notes (Signed)
Virtual Visit via Telephone Note  I connected with Allison Sharp on 07/10/19 at  1:40 PM EST by telephone and verified that I am speaking with the correct person using two identifiers.   I discussed the limitations, risks, security and privacy concerns of performing an evaluation and management service by telephone and the availability of in person appointments. I also discussed with the patient that there may be a patient responsible charge related to this service. The patient expressed understanding and agreed to proceed.   History of Present Illness: Patient was evaluated by phone session.  Her niece Allison Sharp was also present.  Patient is concerned about gradual declining in her memory.  She also noticed irritability, easily getting frustrated and paranoia.  Her niece told that she gets some time paranoid believes people are watching her.  She gets short temper.  She also gain weight in past few weeks.  Patient has lymphedema.  She reported compliant with medication.  She has mild tremors but does not interfere in her daily activities.  She denies any hallucination or any suicidal thoughts.  Patient admitted having paranoia and sometimes angry episodes.  She has recently seen PCP and her blood work was done.  PCP had increase trazodone from 69m to 100 mg to help her sleep.  Patient noticed marginal improvement in her sleep.  She is compliant with Depakote, Risperdal, Cogentin and Cymbalta.  I reviewed blood work results.  Her Depakote level is 40 which is subtherapeutic.   Past Psychiatric History:Reviewed. H/Opsychiatric illness with multiple inpatient treatment. [facility]H/Osuicidal attempt by taking overdose. Diagnosed with PTSD, depression, bipolar disorder. Tried Paxil, Prozac, Wellbutrin, Effexor, Lexapro, amitriptyline, Cymbalta, Abilify (Tremors), Neurontin, trazodone, Thorazine, hydroxyzine, Luvox, [place], Zyprexa and Latuda. Best responded  on Risperdal and Cymbalta.  Recent Results (from the past 2160 hour(s))  CBC with Differential/Platelet     Status: Abnormal   Collection Time: 05/12/19 11:47 AM  Result Value Ref Range   WBC 6.5 3.4 - 10.8 x10E3/uL   RBC 4.60 3.77 - 5.28 x10E6/uL   Hemoglobin 14.4 11.1 - 15.9 g/dL   Hematocrit 42.5 34.0 - 46.6 %   MCV 92 79 - 97 fL   MCH 31.3 26.6 - 33.0 pg   MCHC 33.9 31.5 - 35.7 g/dL   RDW 13.5 11.7 - 15.4 %   Platelets 104 (L) 150 - 450 x10E3/uL   Neutrophils 62 Not Estab. %   Lymphs 25 Not Estab. %   Monocytes 9 Not Estab. %   Eos 3 Not Estab. %   Basos 1 Not Estab. %   Neutrophils Absolute 4.1 1.4 - 7.0 x10E3/uL   Lymphocytes Absolute 1.6 0.7 - 3.1 x10E3/uL   Monocytes Absolute 0.6 0.1 - 0.9 x10E3/uL   EOS (ABSOLUTE) 0.2 0.0 - 0.4 x10E3/uL   Basophils Absolute 0.0 0.0 - 0.2 x10E3/uL   Immature Granulocytes 0 Not Estab. %   Immature Grans (Abs) 0.0 0.0 - 0.1 x10E3/uL  CMP14+EGFR     Status: Abnormal   Collection Time: 05/12/19 11:47 AM  Result Value Ref Range   Glucose 107 (H) 65 - 99 mg/dL   BUN 13 8 - 27 mg/dL   Creatinine, Ser 0.68 0.57 - 1.00 mg/dL   GFR calc non Af Amer 92 >59 mL/min/1.73   GFR calc Af Amer 106 >59 mL/min/1.73   BUN/Creatinine Ratio 19 12 - 28   Sodium 146 (H) 134 - 144 mmol/L   Potassium 4.3 3.5 - 5.2 mmol/L  Chloride 103 96 - 106 mmol/L   CO2 23 20 - 29 mmol/L   Calcium 9.7 8.7 - 10.3 mg/dL   Total Protein 7.1 6.0 - 8.5 g/dL   Albumin 4.3 3.8 - 4.8 g/dL   Globulin, Total 2.8 1.5 - 4.5 g/dL   Albumin/Globulin Ratio 1.5 1.2 - 2.2   Bilirubin Total 0.3 0.0 - 1.2 mg/dL   Alkaline Phosphatase 86 39 - 117 IU/L   AST 15 0 - 40 IU/L   ALT 8 0 - 32 IU/L  TSH     Status: Abnormal   Collection Time: 05/12/19 11:47 AM  Result Value Ref Range   TSH 6.600 (H) 0.450 - 4.500 uIU/mL  Lipid panel     Status: Abnormal   Collection Time: 05/12/19 11:47 AM  Result Value Ref Range   Cholesterol, Total 181 100 - 199 mg/dL   Triglycerides 205 (H) 0 -  149 mg/dL   HDL 42 >39 mg/dL   VLDL Cholesterol Cal 35 5 - 40 mg/dL   LDL Chol Calc (NIH) 104 (H) 0 - 99 mg/dL   Chol/HDL Ratio 4.3 0.0 - 4.4 ratio    Comment:                                   T. Chol/HDL Ratio                                             Men  Women                               1/2 Avg.Risk  3.4    3.3                                   Avg.Risk  5.0    4.4                                2X Avg.Risk  9.6    7.1                                3X Avg.Risk 23.4   11.0   Valproic acid level     Status: Abnormal   Collection Time: 05/12/19 11:47 AM  Result Value Ref Range   Valproic Acid Lvl 40 (L) 50 - 100 ug/mL    Comment:                                 Detection Limit = 4                            <4 indicates None Detected Toxicity may occur at levels of 100-500. Measurements of free unbound valproic acid may improve the assess- ment of clinical response.   wound culture     Status: Abnormal   Collection Time: 05/12/19 11:54 AM   Specimen: Neck; Other   NK  Result Value Ref Range   Anaerobic Culture Final  report (A)    Result 1 Finegoldia magna (A)     Comment: Light growth   Aerobic Culture Final report (A)    Result 1 Staphylococcus aureus (A)     Comment: Scant growth Based on susceptibility to oxacillin this isolate would be susceptible to: *Penicillinase-stable penicillins, such as:   Cloxacillin, Dicloxacillin, Nafcillin *Beta-lactam combination agents, such as:   Amoxicillin-clavulanic acid, Ampicillin-sulbactam,   Piperacillin-tazobactam *Oral cephems, such as:   Cefaclor, Cefdinir, Cefpodoxime, Cefprozil, Cefuroxime,   Cephalexin, Loracarbef *Parenteral cephems, such as:   Cefazolin, Cefepime, Cefotaxime, Cefotetan, Ceftaroline,   Ceftizoxime, Ceftriaxone, Cefuroxime *Carbapenems, such as:   Doripenem, Ertapenem, Imipenem, Meropenem    Result 2 Mixed skin flora     Comment: Scant growth   Antimicrobial Susceptibility Comment     Comment:        ** S = Susceptible; I = Intermediate; R = Resistant **                    P = Positive; N = Negative             MICS are expressed in micrograms per mL    Antibiotic                 RSLT#1    RSLT#2    RSLT#3    RSLT#4 Ciprofloxacin                  R Clindamycin                    S Erythromycin                   R Gentamicin                     S Levofloxacin                   I Linezolid                      S Moxifloxacin                   I Oxacillin                      S Penicillin                     R Quinupristin/Dalfopristin      S Rifampin                       S Tetracycline                   S Trimethoprim/Sulfa             S Vancomycin                     S      Psychiatric Specialty Exam: Physical Exam  Review of Systems  Last menstrual period 02/18/1995.There is no height or weight on file to calculate BMI.  General Appearance: NA  Eye Contact:  NA  Speech:  Slow  Volume:  Decreased  Mood:  Anxious  Affect:  NA  Thought Process:  Descriptions of Associations: Intact  Orientation:  Full (Time, Place, and Person)  Thought Content:  Paranoid Ideation  Suicidal Thoughts:  No  Homicidal Thoughts:  No  Memory:  Immediate;  Fair Recent;   Fair Remote;   Fair  Judgement:  Intact  Insight:  Present  Psychomotor Activity:  NA  Concentration:  Concentration: Fair and Attention Span: Fair  Recall:  [employer] of Knowledge:  Fair  Language:  Fair  Akathisia:  No  Handed:  Right  AIMS (if indicated):     Assets:  Communication Skills Desire for Improvement Housing Resilience Social Support  ADL's:  Intact  Cognition:  Impaired,  Mild  Sleep:   fair      Assessment and Plan: Bipolar disorder type I.  Generalized anxiety disorder.  Mild cognitive impairment.  I review her blood work results.  Patient is not sure what trigger recently that she is more upset irritable and having paranoia.  Her Depakote level is subtherapeutic.  We will increase  the Depakote and recommend to take 500 mg in the morning and continue to take 1500 mg at bedtime.  I also recommend that she should follow-up with her PCP about sudden weight gain.  I also believe she should get neurology consult for gradual decline of memory.  She is already taking a moderate dose of Risperdal 2 mg twice a day and I am afraid increasing further dose.  Continue Cogentin 1 mg daily, Cymbalta 60 mg daily and new dose of Depakote 500 mg in the morning and 1050 mg at bedtime.  Follow-up in 4 weeks.  I recommended to have blood work of Depakote level in 3 to 4 weeks and patient and her niece agreed to have it done at her primary care physician.  They have appointment in few weeks.  Recommended to call us back if she has any question or any concern.  Follow-up in 6 weeks.    Follow Up Instructions:    I discussed the assessment and treatment plan with the patient. The patient was provided an opportunity to ask questions and all were answered. The patient agreed with the plan and demonstrated an understanding of the instructions.   The patient was advised to call back or seek an in-person evaluation if the symptoms worsen or if the condition fails to improve as anticipated.  I provided 20 minutes of non-face-to-face time during this encounter.   Kathlee Nations, MD

## 2019-07-14 ENCOUNTER — Encounter: Payer: Self-pay | Admitting: Family Medicine

## 2019-07-15 ENCOUNTER — Other Ambulatory Visit: Payer: Self-pay | Admitting: Family Medicine

## 2019-07-22 ENCOUNTER — Telehealth: Payer: Self-pay | Admitting: Family Medicine

## 2019-07-22 NOTE — Chronic Care Management (AMB) (Signed)
  Chronic Care Management   Outreach Note  07/22/2019 Name: Allison Sharp MRN: [id number] DOB: [birth year]  Allison Sharp is a 64 y.o. year old female who is a primary care patient of Dettinger, Fransisca Kaufmann, MD. I reached out to [name] by phone today in response to a referral sent by [name]'s health plan.     Spoke with patients caregiver [name] and discussed the Care management team. [name] states that she will speak to [name] and return my call. The patient was referred to the case management team for assistance with care management and care coordination.   Follow Up Plan: The care management team will reach out to the patient again over the next [employer]  days.  If patient returns call to provider office, please advise to call [facility]  at [place], [place], [place], [place] [address] Direct Dial: [phone] [email or web address]: [email or web address]

## 2019-07-25 ENCOUNTER — Ambulatory Visit: Payer: Medicare Other | Attending: Internal Medicine

## 2019-07-25 DIAGNOSIS — Z23 Encounter for immunization: Secondary | ICD-10-CM | POA: Insufficient documentation

## 2019-07-25 NOTE — Progress Notes (Signed)
   Covid-19 Vaccination Clinic  Name:  Allison Sharp    MRN: [id number] DOB: 11/11/1955  07/25/2019  [name] was observed post Covid-19 immunization for 15 minutes without incidence. She was provided with Vaccine Information Sheet and instruction to access the V-Safe system.   [name] was instructed to call 911 with any severe reactions post vaccine: Marland Kitchen Difficulty breathing  . Swelling of your face and throat  . A fast heartbeat  . A bad rash all over your body  . Dizziness and weakness    Immunizations Administered    Name Date Dose VIS Date Route   Pfizer COVID-19 Vaccine 07/25/2019  3:02 PM 0.3 mL 05/09/2019 Intramuscular   Manufacturer: [place]   Lot: [id number]   [place]: [id number]

## 2019-07-28 NOTE — Chronic Care Management (AMB) (Signed)
  Chronic Care Management   Outreach Note  07/28/2019 Name: [name] MRN: [id number] DOB: 1955/10/17  Allison Sharp is a 64 y.o. year old female who is a primary care patient of Dettinger, Fransisca Kaufmann, MD. I reached out to [name] by phone today in response to a referral sent by Ms. Colvin Caroli Farrington's health plan.     An unsuccessful telephone outreach was attempted today. The patient was referred to the case management team for assistance with care management and care coordination.   Follow Up Plan: A HIPPA compliant phone message was left for the patient's caregiver [name]  providing contact information and requesting a return call.  The care management team will reach out to the patient again over the next 7 days.  If patient's caregiver [name] returns call to provider office, please advise to call [facility]  at [place], [place], [place], [place] [address] Direct Dial: [phone] [email or web address] Website: [email or web address]

## 2019-07-30 ENCOUNTER — Other Ambulatory Visit: Payer: Self-pay | Admitting: Family Medicine

## 2019-07-31 ENCOUNTER — Other Ambulatory Visit: Payer: Self-pay | Admitting: Family Medicine

## 2019-07-31 NOTE — Chronic Care Management (AMB) (Signed)
°  Chronic Care Management   Outreach Note  07/31/2019 Name: [name] MRN: [id number] DOB: 29-Jun-1955  Allison Sharp is a 64 y.o. year old female who is a primary care patient of Dettinger, Fransisca Kaufmann, MD. I reached out to [name] by phone today in response to a referral sent by Ms. Colvin Caroli Ranieri's health plan.     Third unsuccessful telephone outreach was attempted today. The patient was referred to the case management team for assistance with care management and care coordination. The patient's primary care provider has been notified of our unsuccessful attempts to make or maintain contact with the patient. The care management team is pleased to engage with this patient at any time in the future should he/she be interested in assistance from the care management team.   Follow Up Plan: The care management team is available to follow up with the patient after provider conversation with the patient regarding recommendation for care management engagement and subsequent re-referral to the care management team.   Noreene Larsson, [place], [place], [place] [address] Direct Dial: [phone] [email or web address] Website: [email or web address]

## 2019-08-11 ENCOUNTER — Ambulatory Visit: Payer: Medicare Other | Admitting: Family Medicine

## 2019-08-13 ENCOUNTER — Encounter: Payer: Self-pay | Admitting: Family Medicine

## 2019-08-20 ENCOUNTER — Ambulatory Visit: Payer: Medicare Other | Attending: Internal Medicine

## 2019-08-20 DIAGNOSIS — Z23 Encounter for immunization: Secondary | ICD-10-CM

## 2019-08-20 NOTE — Progress Notes (Signed)
   Covid-19 Vaccination Clinic  Name:  Allison Sharp    MRN: [id number] DOB: 1955-07-20  08/20/2019  Allison Sharp was observed post Covid-19 immunization for 15 minutes without incident. She was provided with Vaccine Information Sheet and instruction to access the V-Safe system.   Allison Sharp was instructed to call 911 with any severe reactions post vaccine: Marland Kitchen Difficulty breathing  . Swelling of face and throat  . A fast heartbeat  . A bad rash all over body  . Dizziness and weakness   Immunizations Administered    Name Date Dose VIS Date Route   Pfizer COVID-19 Vaccine 08/20/2019  3:28 PM 0.3 mL 05/09/2019 Intramuscular   Manufacturer: [place]   Lot: [id number]   [place]: [id number]

## 2019-08-21 ENCOUNTER — Other Ambulatory Visit: Payer: Self-pay

## 2019-08-21 ENCOUNTER — Ambulatory Visit (HOSPITAL_COMMUNITY): Payer: Medicare Other | Admitting: Psychiatry

## 2019-08-25 ENCOUNTER — Other Ambulatory Visit: Payer: Self-pay | Admitting: Family Medicine

## 2019-09-03 ENCOUNTER — Other Ambulatory Visit (HOSPITAL_COMMUNITY): Payer: Self-pay | Admitting: Psychiatry

## 2019-09-03 DIAGNOSIS — F3160 Bipolar disorder, current episode mixed, unspecified: Secondary | ICD-10-CM

## 2019-09-09 ENCOUNTER — Other Ambulatory Visit: Payer: Self-pay | Admitting: Family Medicine

## 2019-09-10 ENCOUNTER — Telehealth: Payer: Self-pay | Admitting: Family Medicine

## 2019-09-10 DIAGNOSIS — F3160 Bipolar disorder, current episode mixed, unspecified: Secondary | ICD-10-CM

## 2019-09-10 MED ORDER — DIVALPROEX SODIUM 500 MG PO DR TAB: DELAYED_RELEASE_TABLET | ORAL | 0 refills | Status: DC

## 2019-09-10 NOTE — Telephone Encounter (Signed)
Pts daughter is requesting that Dr Dettinger refill patients Depakote Rx for 30 days until patient can get an appt with her Psychiatrist, who will then refill it. Daughter says she has tried getting in touch with pt's psychiatrist to schedule an appt but has not been able to reach anyone.

## 2019-09-10 NOTE — Telephone Encounter (Signed)
Patient aware.

## 2019-09-10 NOTE — Telephone Encounter (Signed)
I am okay to go ahead and refill her Depakote for 30 days at the dose that she has currently what ever it is.  Go ahead and send it [name], MD [facility] 09/10/2019, 4:15 PM

## 2019-09-10 NOTE — Telephone Encounter (Signed)
I sent Depakote refill as it is in the computer which is 1 in the morning and 3 in the evening

## 2019-09-11 ENCOUNTER — Other Ambulatory Visit: Payer: Self-pay | Admitting: Family Medicine

## 2019-09-23 MED ORDER — TRAZODONE HCL 50 MG PO TABS
50.00 | ORAL_TABLET | ORAL | Status: DC
Start: 2019-09-23 — End: 2019-09-23

## 2019-09-23 MED ORDER — DIVALPROEX SODIUM 500 MG PO DR TAB
1500.00 | DELAYED_RELEASE_TABLET | ORAL | Status: DC
Start: 2019-09-23 — End: 2019-09-23

## 2019-09-23 MED ORDER — RISPERIDONE 2 MG PO TABS
2.00 | ORAL_TABLET | ORAL | Status: DC
Start: 2019-09-23 — End: 2019-09-23

## 2019-09-23 MED ORDER — ATORVASTATIN CALCIUM 20 MG PO TABS
20.00 | ORAL_TABLET | ORAL | Status: DC
Start: 2019-09-24 — End: 2019-09-23

## 2019-09-23 MED ORDER — LEVOTHYROXINE SODIUM 25 MCG PO TABS
25.00 | ORAL_TABLET | ORAL | Status: DC
Start: 2019-09-24 — End: 2019-09-23

## 2019-09-23 MED ORDER — BUDESONIDE-FORMOTEROL FUMARATE 80-4.5 MCG/ACT IN AERO
2.00 | INHALATION_SPRAY | RESPIRATORY_TRACT | Status: DC
Start: 2019-09-23 — End: 2019-09-23

## 2019-09-23 MED ORDER — PANTOPRAZOLE SODIUM 40 MG PO TBEC
40.00 | DELAYED_RELEASE_TABLET | ORAL | Status: DC
Start: 2019-09-24 — End: 2019-09-23

## 2019-09-28 DIAGNOSIS — I5032 Chronic diastolic (congestive) heart failure: Secondary | ICD-10-CM | POA: Diagnosis not present

## 2019-09-28 DIAGNOSIS — R1312 Dysphagia, oropharyngeal phase: Secondary | ICD-10-CM | POA: Diagnosis not present

## 2019-09-28 DIAGNOSIS — I2510 Atherosclerotic heart disease of native coronary artery without angina pectoris: Secondary | ICD-10-CM | POA: Diagnosis not present

## 2019-09-28 DIAGNOSIS — D693 Immune thrombocytopenic purpura: Secondary | ICD-10-CM | POA: Diagnosis not present

## 2019-09-28 DIAGNOSIS — I10 Essential (primary) hypertension: Secondary | ICD-10-CM | POA: Diagnosis not present

## 2019-09-29 DIAGNOSIS — G4733 Obstructive sleep apnea (adult) (pediatric): Secondary | ICD-10-CM | POA: Diagnosis not present

## 2019-09-30 DIAGNOSIS — K5900 Constipation, unspecified: Secondary | ICD-10-CM | POA: Diagnosis not present

## 2019-09-30 DIAGNOSIS — R1312 Dysphagia, oropharyngeal phase: Secondary | ICD-10-CM | POA: Diagnosis not present

## 2019-09-30 DIAGNOSIS — I10 Essential (primary) hypertension: Secondary | ICD-10-CM | POA: Diagnosis not present

## 2019-09-30 DIAGNOSIS — D693 Immune thrombocytopenic purpura: Secondary | ICD-10-CM | POA: Diagnosis not present

## 2019-09-30 DIAGNOSIS — I5032 Chronic diastolic (congestive) heart failure: Secondary | ICD-10-CM | POA: Diagnosis not present

## 2019-09-30 MED ORDER — BUDESONIDE-FORMOTEROL FUMARATE 80-4.5 MCG/ACT IN AERO
2.00 | INHALATION_SPRAY | RESPIRATORY_TRACT | Status: DC
Start: 2019-10-01 — End: 2019-09-30

## 2019-09-30 MED ORDER — DICLOFENAC SODIUM 1 % EX GEL
2.00 | CUTANEOUS | Status: DC
Start: ? — End: 2019-09-30

## 2019-09-30 MED ORDER — LORATADINE 10 MG PO TABS
10.00 | ORAL_TABLET | ORAL | Status: DC
Start: 2019-10-02 — End: 2019-09-30

## 2019-09-30 MED ORDER — GENERIC EXTERNAL MEDICATION
4.00 | Status: DC
Start: 2019-10-01 — End: 2019-09-30

## 2019-09-30 MED ORDER — ALBUTEROL SULFATE HFA 108 (90 BASE) MCG/ACT IN AERS
2.00 | INHALATION_SPRAY | RESPIRATORY_TRACT | Status: DC
Start: ? — End: 2019-09-30

## 2019-09-30 MED ORDER — SODIUM CHLORIDE 0.9 % IV SOLN
10.00 | INTRAVENOUS | Status: DC
Start: ? — End: 2019-09-30

## 2019-09-30 MED ORDER — DULOXETINE HCL 30 MG PO CPEP
60.00 | ORAL_CAPSULE | ORAL | Status: DC
Start: 2019-10-02 — End: 2019-09-30

## 2019-09-30 MED ORDER — PANTOPRAZOLE SODIUM 40 MG PO TBEC
40.00 | DELAYED_RELEASE_TABLET | ORAL | Status: DC
Start: 2019-10-02 — End: 2019-09-30

## 2019-09-30 MED ORDER — GENERIC EXTERNAL MEDICATION
Status: DC
Start: ? — End: 2019-09-30

## 2019-09-30 MED ORDER — POLYETHYLENE GLYCOL 3350 17 GM/SCOOP PO POWD
17.00 | ORAL | Status: DC
Start: ? — End: 2019-09-30

## 2019-09-30 MED ORDER — ATORVASTATIN CALCIUM 20 MG PO TABS
20.00 | ORAL_TABLET | ORAL | Status: DC
Start: 2019-10-02 — End: 2019-09-30

## 2019-09-30 MED ORDER — TRAZODONE HCL 100 MG PO TABS
100.00 | ORAL_TABLET | ORAL | Status: DC
Start: 2019-10-01 — End: 2019-09-30

## 2019-09-30 MED ORDER — ACETAMINOPHEN 325 MG PO TABS
650.00 | ORAL_TABLET | ORAL | Status: DC
Start: ? — End: 2019-09-30

## 2019-09-30 MED ORDER — SENNOSIDES-DOCUSATE SODIUM 8.6-50 MG PO TABS
2.00 | ORAL_TABLET | ORAL | Status: DC
Start: 2019-10-02 — End: 2019-09-30

## 2019-09-30 MED ORDER — TRAZODONE HCL 50 MG PO TABS
50.00 | ORAL_TABLET | ORAL | Status: DC
Start: ? — End: 2019-09-30

## 2019-09-30 MED ORDER — CLOTRIMAZOLE-BETAMETHASONE 1-0.05 % EX CREA
1.00 | TOPICAL_CREAM | CUTANEOUS | Status: DC
Start: 2019-10-01 — End: 2019-09-30

## 2019-09-30 MED ORDER — BENZTROPINE MESYLATE 1 MG PO TABS
1.00 | ORAL_TABLET | ORAL | Status: DC
Start: 2019-10-01 — End: 2019-09-30

## 2019-09-30 MED ORDER — RISPERIDONE 1 MG PO TABS
1.00 | ORAL_TABLET | ORAL | Status: DC
Start: 2019-10-02 — End: 2019-09-30

## 2019-09-30 MED ORDER — GENERIC EXTERNAL MEDICATION
125.00 | Status: DC
Start: 2019-10-02 — End: 2019-09-30

## 2019-09-30 MED ORDER — ALUM & MAG HYDROXIDE-SIMETH 200-200-20 MG/5ML PO SUSP
30.00 | ORAL | Status: DC
Start: ? — End: 2019-09-30

## 2019-09-30 MED ORDER — FUROSEMIDE 20 MG PO TABS
20.00 | ORAL_TABLET | ORAL | Status: DC
Start: ? — End: 2019-09-30

## 2019-09-30 MED ORDER — ENOXAPARIN SODIUM 40 MG/0.4ML ~~LOC~~ SOLN
40.00 | SUBCUTANEOUS | Status: DC
Start: 2019-10-01 — End: 2019-09-30

## 2019-09-30 MED ORDER — DIVALPROEX SODIUM 500 MG PO DR TAB
1500.00 | DELAYED_RELEASE_TABLET | ORAL | Status: DC
Start: 2019-10-01 — End: 2019-09-30

## 2019-09-30 MED ORDER — LISINOPRIL 5 MG PO TABS
5.00 | ORAL_TABLET | ORAL | Status: DC
Start: 2019-10-02 — End: 2019-09-30

## 2019-09-30 MED ORDER — CLONIDINE HCL 0.1 MG PO TABS
0.10 | ORAL_TABLET | ORAL | Status: DC
Start: 2019-10-01 — End: 2019-09-30

## 2019-10-01 DIAGNOSIS — R1312 Dysphagia, oropharyngeal phase: Secondary | ICD-10-CM | POA: Diagnosis not present

## 2019-10-01 DIAGNOSIS — I10 Essential (primary) hypertension: Secondary | ICD-10-CM | POA: Diagnosis not present

## 2019-10-01 DIAGNOSIS — G4733 Obstructive sleep apnea (adult) (pediatric): Secondary | ICD-10-CM | POA: Diagnosis not present

## 2019-10-01 DIAGNOSIS — D693 Immune thrombocytopenic purpura: Secondary | ICD-10-CM | POA: Diagnosis not present

## 2019-10-01 DIAGNOSIS — I5032 Chronic diastolic (congestive) heart failure: Secondary | ICD-10-CM | POA: Diagnosis not present

## 2019-10-03 ENCOUNTER — Telehealth (INDEPENDENT_AMBULATORY_CARE_PROVIDER_SITE_OTHER): Payer: Medicare Other | Admitting: Psychiatry

## 2019-10-03 ENCOUNTER — Other Ambulatory Visit: Payer: Self-pay

## 2019-10-03 DIAGNOSIS — F411 Generalized anxiety disorder: Secondary | ICD-10-CM | POA: Diagnosis not present

## 2019-10-03 DIAGNOSIS — G3184 Mild cognitive impairment, so stated: Secondary | ICD-10-CM | POA: Diagnosis not present

## 2019-10-03 DIAGNOSIS — F3160 Bipolar disorder, current episode mixed, unspecified: Secondary | ICD-10-CM | POA: Diagnosis not present

## 2019-10-03 MED ORDER — RISPERIDONE 1 MG PO TABS: 1.0000 mg | ORAL_TABLET | Freq: Every day | ORAL | 1 refills | Status: DC

## 2019-10-03 MED ORDER — TRAZODONE HCL 100 MG PO TABS: 100.0000 mg | ORAL_TABLET | Freq: Every day | ORAL | 1 refills | Status: DC

## 2019-10-03 MED ORDER — BENZTROPINE MESYLATE 1 MG PO TABS: 1.0000 mg | ORAL_TABLET | Freq: Every day | ORAL | 1 refills | Status: DC

## 2019-10-03 MED ORDER — RISPERIDONE 4 MG PO TABS: 4.0000 mg | ORAL_TABLET | Freq: Every day | ORAL | 1 refills | Status: DC

## 2019-10-03 MED ORDER — DIVALPROEX SODIUM 500 MG PO DR TAB: DELAYED_RELEASE_TABLET | ORAL | 0 refills | Status: DC

## 2019-10-03 MED ORDER — GENERIC EXTERNAL MEDICATION
Status: DC
Start: ? — End: 2019-10-03

## 2019-10-03 MED ORDER — LACTULOSE 10 GM/15ML PO SOLN
20.00 | ORAL | Status: DC
Start: 2019-10-01 — End: 2019-10-03

## 2019-10-03 MED ORDER — DULOXETINE HCL 60 MG PO CPEP: 60.0000 mg | ORAL_CAPSULE | Freq: Every day | ORAL | 1 refills | Status: DC

## 2019-10-03 NOTE — Progress Notes (Signed)
Virtual Visit via Telephone Note  I connected with Allison Sharp on 10/03/19 at  9:40 AM EDT by telephone and verified that I am speaking with the correct person using two identifiers.   I discussed the limitations, risks, security and privacy concerns of performing an evaluation and management service by telephone and the availability of in person appointments. I also discussed with the patient that there may be a patient responsible charge related to this service. The patient expressed understanding and agreed to proceed.   History of Present Illness: Patient is evaluated by phone section.  She was recently admitted at [facility] due to change in her mental status.  She was confused, agitated, irritable and having freak suicidal thoughts.  She was admitted on psychiatry and found to have low platelet.  She was given transfusion and her medicines were changed and she is now taking Risperdal 4 mg at bedtime and 1 mg in the morning.  She is also taking trazodone 100 mg every night which is helping her sleep.  I review her blood work results.  Her platelet is improved to 149.  Her Depakote level is 63 which was drawn on April 27.  Her Cogentin, Cymbalta and Depakote was unchanged.  Patient feels her sleep is improved since taking the trazodone every night 100 mg.  She does not hear any more voices but admitted gradually decline in her memory.  Her niece Allison Sharp helps her a lot and involved in her daily functioning.  I also spoke to Allison Sharp who mentioned that she is doing much better since sleeping better.  She has no tremors or shakes.  She endorsed some paranoia but denies any suicidal thoughts, active hallucination or any agitation.   Past Psychiatric History:Reviewed. H/Opsychiatric illness with multiple inpatient treatment. Last admissions inMay 2020 at [facility].H/Osuicidal attempt by taking overdose. Diagnosed with PTSD, depression, bipolar disorder. Tried Paxil, Prozac,  Wellbutrin, Effexor, Lexapro, amitriptyline, Cymbalta, Abilify (Tremors), Neurontin, trazodone, Thorazine, hydroxyzine, Luvox, Sinequan, Zyprexa and Latuda. Best responded on Risperdal and Cymbalta.  No results found for this or any previous visit (from the past 2160 hour(s)).  Psychiatric Specialty Exam: Physical Exam  Review of Systems  Last menstrual period 02/18/1995.There is no height or weight on file to calculate BMI.  General Appearance: NA  Eye Contact:  NA  Speech:  Slow  Volume:  Decreased  Mood:  tired  Affect:  NA  Thought Process:  Descriptions of Associations: Intact  Orientation:  Full (Time, Place, and Person)  Thought Content:  Poverty of thought content but no paranoia, delusion or hallucination.  Suicidal Thoughts:  No  Homicidal Thoughts:  No  Memory:  Immediate;   Fair Recent;   Fair Remote;   Fair  Judgement:  Fair  Insight:  Fair  Psychomotor Activity:  NA  Concentration:  Concentration: Fair and Attention Span: Fair  Recall:  [employer] of Knowledge:  Fair  Language:  Good  Akathisia:  No  Handed:  Right  AIMS (if indicated):     Assets:  Desire for Improvement Housing Resilience Social Support  ADL's:  Intact  Cognition:  Impaired,  Mild  Sleep:   Improved      Assessment and Plan: Bipolar disorder type I.  Generalized anxiety disorder.  Cognitive impairment  I reviewed blood work from her recent hospitalization at [facility].  Her Depakote level is 66 and her platelet is improved.  I spoke to patient and her niece Allison Sharp that Depakote may cause low  platelet and we should consider lowering the dose.  Patient and her niece resistant since she already responded well on Depakote but after some discussion willing to try cutting down the dose of Depakote.  Her Risperdal is increased from 4 mg to now 5 mg.  She is taking Lasix those days when she gained more than 3 pounds.  She is also on oxygen.  We will continue Cymbalta 60 mg daily, Cogentin 1 mg  daily, trazodone 100 mg at bedtime and continue recent adjustment of Risperdal to take 1 mg in the morning and 4 mg at bedtime.  We will reduce Depakote to take five 1 mg in the morning and positive milligrams at bedtime.  However I recommended if symptoms started to get worse then she should call us immediately.  Follow-up in 6 weeks.  Time spent 25 minutes.     Follow Up Instructions:    I discussed the assessment and treatment plan with the patient. The patient was provided an opportunity to ask questions and all were answered. The patient agreed with the plan and demonstrated an understanding of the instructions.   The patient was advised to call back or seek an in-person evaluation if the symptoms worsen or if the condition fails to improve as anticipated.  I provided 25 minutes of non-face-to-face time during this encounter.   Kathlee Nations, MD

## 2019-10-07 ENCOUNTER — Other Ambulatory Visit: Payer: Self-pay | Admitting: Family Medicine

## 2019-10-08 ENCOUNTER — Encounter: Payer: Self-pay | Admitting: Family Medicine

## 2019-10-08 ENCOUNTER — Ambulatory Visit (INDEPENDENT_AMBULATORY_CARE_PROVIDER_SITE_OTHER): Payer: Medicare Other | Admitting: Family Medicine

## 2019-10-08 ENCOUNTER — Other Ambulatory Visit: Payer: Self-pay

## 2019-10-08 VITALS — BP 108/66 | HR 79 | Temp 98.0°F | Ht 69.0 in | Wt 398.0 lb

## 2019-10-08 DIAGNOSIS — I10 Essential (primary) hypertension: Secondary | ICD-10-CM | POA: Diagnosis not present

## 2019-10-08 DIAGNOSIS — M545 Low back pain, unspecified: Secondary | ICD-10-CM

## 2019-10-08 DIAGNOSIS — E785 Hyperlipidemia, unspecified: Secondary | ICD-10-CM

## 2019-10-08 DIAGNOSIS — E039 Hypothyroidism, unspecified: Secondary | ICD-10-CM | POA: Diagnosis not present

## 2019-10-08 DIAGNOSIS — E559 Vitamin D deficiency, unspecified: Secondary | ICD-10-CM

## 2019-10-08 DIAGNOSIS — G8929 Other chronic pain: Secondary | ICD-10-CM | POA: Diagnosis not present

## 2019-10-08 DIAGNOSIS — D696 Thrombocytopenia, unspecified: Secondary | ICD-10-CM

## 2019-10-08 MED ORDER — TRAMADOL HCL 50 MG PO TABS: 50.0000 mg | ORAL_TABLET | Freq: Three times a day (TID) | ORAL | 2 refills | Status: DC | PRN

## 2019-10-08 NOTE — Progress Notes (Signed)
BP 108/66   Pulse 79   Temp 98 F (36.7 C)   Ht '5\' 9"'  (1.753 m)   Wt (!) 398 lb (180.5 kg)   LMP 02/18/1995   SpO2 97%   BMI 58.77 kg/m    Subjective:   Patient ID: Allison Sharp, female    DOB: Nov 08, 1955, 64 y.o.   MRN: [id number]  HPI: Allison Sharp is a 64 y.o. female presenting on 10/08/2019 for Medical Management of Chronic Issues, Manic Behavior, Hypothyroidism, and Hypotension   HPI Hypothyroidism recheck Patient is coming in for thyroid recheck today as well. They deny any issues with hair changes or heat or cold problems or diarrhea or constipation. They deny any chest pain or palpitations. They are currently on levothyroxine 125 micrograms   Hypertension Patient is currently on clonidine and lisinopril, and their blood pressure today is 108/66. Patient denies any lightheadedness or dizziness. Patient denies headaches, blurred vision, chest pains, shortness of breath, or weakness. Denies any side effects from medication and is content with current medication.   Patient's hypertension and hypothyroidism and arthritis are more complicated by the patient's morbid obesity.  Discussed weight loss and lifestyle modification and exercise with the patient.   Patient is coming for vitamin D deficiency recheck.  Patient had low platelets and she thinks the reason that she was in the hospital with hallucinations is because her platelets were low, will go ahead and order a repeat CBC.  Pain assessment: Cause of pain-chronic back pain and arthritis in both of her legs and hips and knees due to morbid obesity Pain location-hips and back and knees Pain on scale of 1-10-mild, 2 out of 10 Frequency-flares up frequently but not every day What increases pain-weightbearing, patient stays in wheelchair most of the time now What makes pain Better-tramadol and rest Effects on ADL -patient is mostly limited to a wheelchair because of morbid obesity and pain Any change in general  medical condition-none  Current opioids rx-tramadol 50 mg every 8 hours as needed # meds rx-90 Effectiveness of current meds-works well, does not use every day Adverse reactions form pain meds-none Morphine equivalent-15  Pill count performed-No Last drug screen -N/A ( high risk q51m moderate risk q622mlow risk yearly ) Urine drug [place] today- Yes [place]  If yes were their any concerning findings? -None    No flowsheet data found.   Pain contract signed on: Today  Relevant past medical, surgical, family and social history reviewed and updated as indicated. Interim medical history since our last visit reviewed. Allergies and medications reviewed and updated.  Review of Systems  Constitutional: Negative for chills and fever.  Eyes: Negative for visual disturbance.  Respiratory: Negative for chest tightness and shortness of breath.   Cardiovascular: Negative for chest pain and leg swelling.  Musculoskeletal: Negative for back pain and gait problem.  Skin: Negative for rash.  Neurological: Negative for light-headedness and headaches.  Psychiatric/Behavioral: Positive for hallucinations. Negative for agitation, behavioral problems, self-injury, sleep disturbance and suicidal ideas. The patient is not nervous/anxious.   All other systems reviewed and are negative.   Per HPI unless specifically indicated above   Allergies as of 10/08/2019      Reactions   Abilify [aripiprazole] Other (See Comments)   hallucinations   Naproxen Nausea And Vomiting, Swelling   Ativan [lorazepam] Other (See Comments)   Delirium   Haldol [haloperidol] Other (See Comments)   Hallucinating    Hydroxyzine Other (See Comments)  hallucinations      Medication List       Accurate as of Oct 08, 2019  4:13 PM. If you have any questions, ask your nurse or doctor.        STOP taking these medications   sulfamethoxazole-trimethoprim 800-160 MG tablet Commonly known as:  BACTRIM DS Stopped by: Fransisca Kaufmann Waymon Laser, MD     TAKE these medications   albuterol (2.5 MG/3ML) 0.083% nebulizer solution Commonly known as: PROVENTIL Take 3 mLs (2.5 mg total) by nebulization every 4 (four) hours as needed for wheezing or shortness of breath.   albuterol 108 (90 Base) MCG/ACT inhaler Commonly known as: ProAir HFA USE 2 PUFF EVERY 4 HOURS AS NEEDED FOR WHEEZING OR SHORTNESS OF BREATH   atorvastatin 20 MG tablet Commonly known as: LIPITOR TAKE 1 TABLET DAILY   benztropine 1 MG tablet Commonly known as: COGENTIN Take 1 tablet (1 mg total) by mouth at bedtime.   Breo Ellipta 100-25 MCG/INH Aepb Generic drug: fluticasone furoate-vilanterol INHALE 1 PUFF INTO THE LUNGS DAILY   budesonide-formoterol 80-4.5 MCG/ACT inhaler Commonly known as: SYMBICORT Inhale 2 puffs into the lungs in the morning and at bedtime.   celecoxib 200 MG capsule Commonly known as: CELEBREX TAKE (1) CAPSULE TWICE DAILY.   cetirizine 10 MG tablet Commonly known as: ZYRTEC TAKE 1 TABLET ONCE DAILY   cloNIDine 0.1 MG tablet Commonly known as: CATAPRES TAKE ONE TABLET AT BEDTIME   clotrimazole-betamethasone cream Commonly known as: LOTRISONE Apply 1 application topically continuous as needed.   diclofenac sodium 1 % Gel Commonly known as: Voltaren Apply to affected area twice daily as needed What changed:   how much to take  how to take this  when to take this  reasons to take this  additional instructions   divalproex 500 MG DR tablet Commonly known as: DEPAKOTE Take one tab in the AM and two at bed time   DULoxetine 60 MG capsule Commonly known as: CYMBALTA Take 1 capsule (60 mg total) by mouth daily.   fluticasone 50 MCG/ACT nasal spray Commonly known as: FLONASE Place 1 spray 2 (two) times daily as needed into both nostrils for allergies or rhinitis.   furosemide 20 MG tablet Commonly known as: LASIX Take 20 mg by mouth as needed. Takes if gaines 3 pounds    levothyroxine 112 MCG tablet Commonly known as: SYNTHROID TAKE 1 TABLET DAILY   levothyroxine 125 MCG tablet Commonly known as: SYNTHROID Take 1 tablet (125 mcg total) by mouth daily.   lisinopril 5 MG tablet Commonly known as: ZESTRIL Take 5 mg by mouth daily.   nystatin cream Commonly known as: MYCOSTATIN APPLY TO THE AFFECTED AREA THREE TIMES DAILY AS NEEDED .Marland KitchenFOR TOPICAL USE ONLY... What changed: See the new instructions.   omeprazole 20 MG capsule Commonly known as: PRILOSEC TAKE (1) CAPSULE DAILY   ondansetron 4 MG disintegrating tablet Commonly known as: ZOFRAN-ODT DISSOLVE 1 TABLET IN MOUTH EVERY 8 HOURS AS NEEDED FOR NAUSEA AND VOMITING   OXYGEN Inhale 2 L into the lungs daily.   polyethylene glycol powder 17 GM/SCOOP powder Commonly known as: GLYCOLAX/MIRALAX DISSOLVE 17 GM SCOOP IN 8 OZ. OF WATER AND DRINK ONCE DAILY AS NEEDED FOR CONSTIPATION   promethazine 25 MG tablet Commonly known as: PHENERGAN Take 1 tablet (25 mg total) by mouth every 8 (eight) hours as needed for nausea or vomiting.   risperiDONE 1 MG tablet Commonly known as: RISPERDAL Take 1 tablet (1 mg total) by mouth daily.  risperidone 4 MG tablet Commonly known as: RISPERDAL Take 1 tablet (4 mg total) by mouth at bedtime.   therapeutic multivitamin-minerals tablet Take 1 tablet by mouth daily.   traMADol 50 MG tablet Commonly known as: ULTRAM Take 1 tablet (50 mg total) by mouth every 8 (eight) hours as needed for moderate pain.   traZODone 100 MG tablet Commonly known as: DESYREL Take 1 tablet (100 mg total) by mouth at bedtime.   Vitamin D (Ergocalciferol) 1.25 MG (50000 UNIT) Caps capsule Commonly known as: DRISDOL TAKE 1 CAPSULE EVERY 7 SEVEN DAYS        Objective:   BP 108/66   Pulse 79   Temp 98 F (36.7 C)   Ht '5\' 9"'  (1.753 m)   Wt (!) 398 lb (180.5 kg)   LMP 02/18/1995   SpO2 97%   BMI 58.77 kg/m   Wt Readings from Last 3 Encounters:  10/08/19 (!) 398 lb  (180.5 kg)  05/12/19 (!) 399 lb 12.8 oz (181.3 kg)  02/07/19 (!) 396 lb (179.6 kg)    Physical Exam Vitals and nursing note reviewed.  Constitutional:      General: She is not in acute distress.    Appearance: She is well-developed. She is not diaphoretic.  Eyes:     Conjunctiva/sclera: Conjunctivae normal.     Pupils: Pupils are equal, round, and reactive to light.  Cardiovascular:     Rate and Rhythm: Normal rate and regular rhythm.     Heart sounds: Normal heart sounds. No murmur.  Pulmonary:     Effort: Pulmonary effort is normal. No respiratory distress.     Breath sounds: Normal breath sounds. No wheezing.  Musculoskeletal:        General: No tenderness. Normal range of motion.  Skin:    General: Skin is warm and dry.     Findings: No rash.  Neurological:     Mental Status: She is alert and oriented to person, place, and time.     Coordination: Coordination normal.  Psychiatric:        Behavior: Behavior normal.     Assessment & Plan:   Problem List Items Addressed This Visit      Cardiovascular and Mediastinum   Essential hypertension, benign (Chronic)   Relevant Medications   furosemide (LASIX) 20 MG tablet   lisinopril (ZESTRIL) 5 MG tablet   Other Relevant Orders   CBC with Differential/Platelet   CMP14+EGFR     Endocrine   Hypothyroidism - Primary (Chronic)   Relevant Orders   CBC with Differential/Platelet   TSH     Other   Morbid obesity (HCC) (Chronic)   Relevant Orders   Lipid panel   Vitamin D insufficiency (Chronic)   Relevant Orders   VITAMIN D 25 Hydroxy (Vit-D Deficiency, Fractures)   Back pain, chronic   Relevant Medications   traMADol (ULTRAM) 50 MG tablet   Other Relevant Orders   ToxASSURE Select 13 (MW), Urine   Hyperlipidemia LDL goal <130   Relevant Medications   furosemide (LASIX) 20 MG tablet   lisinopril (ZESTRIL) 5 MG tablet   Thrombocytopenia (HCC)      Continue current medication, refill tramadol  We will do blood  work today, recheck her thyroid levels, she is on the higher dose of 125.  Follow up plan: Return in about 3 months (around 01/08/2020), or if symptoms worsen or fail to improve, for Hypothyroidism.  Counseling provided for all of the vaccine components Orders Placed This Encounter  Procedures  . ToxASSURE Select 13 (MW), Urine  . CBC with Differential/Platelet  . CMP14+EGFR  . Lipid panel  . VITAMIN D 25 Hydroxy (Vit-D Deficiency, Fractures)  . TSH    [name], MD [facility] 10/08/2019, 4:13 PM

## 2019-10-08 NOTE — Addendum Note (Signed)
Addended by: [name] on: 10/08/2019 04:47 PM   Modules accepted: Orders

## 2019-10-09 LAB — CBC WITH DIFFERENTIAL/PLATELET

## 2019-10-09 LAB — CMP14+EGFR
ALT: 11 IU/L (ref 0–32)
AST: 16 IU/L (ref 0–40)
Albumin/Globulin Ratio: 1.5 (ref 1.2–2.2)
Albumin: 3.8 g/dL (ref 3.8–4.8)
Alkaline Phosphatase: 70 IU/L (ref 39–117)
BUN/Creatinine Ratio: 23 (ref 12–28)
BUN: 21 mg/dL (ref 8–27)
Bilirubin Total: 0.3 mg/dL (ref 0.0–1.2)
CO2: 27 mmol/L (ref 20–29)
Calcium: 9.4 mg/dL (ref 8.7–10.3)
Chloride: 102 mmol/L (ref 96–106)
Creatinine, Ser: 0.93 mg/dL (ref 0.57–1.00)
GFR calc Af Amer: 75 mL/min/{1.73_m2} (ref >59)
GFR calc non Af Amer: 65 mL/min/{1.73_m2} (ref >59)
Globulin, Total: 2.6 g/dL (ref 1.5–4.5)
Glucose: 113 mg/dL — ABNORMAL HIGH (ref 65–99)
Potassium: 5.4 mmol/L — ABNORMAL HIGH (ref 3.5–5.2)
Sodium: 138 mmol/L (ref 134–144)
Total Protein: 6.4 g/dL (ref 6.0–8.5)

## 2019-10-09 LAB — LIPID PANEL
Chol/HDL Ratio: 4.3 ratio (ref 0.0–4.4)
Cholesterol, Total: 160 mg/dL (ref 100–199)
HDL: 37 mg/dL — ABNORMAL LOW (ref >39)
LDL Chol Calc (NIH): 75 mg/dL (ref 0–99)
Triglycerides: 297 mg/dL — ABNORMAL HIGH (ref 0–149)
VLDL Cholesterol Cal: 48 mg/dL — ABNORMAL HIGH (ref 5–40)

## 2019-10-09 LAB — VITAMIN D 25 HYDROXY (VIT D DEFICIENCY, FRACTURES): Vit D, 25-Hydroxy: 24 ng/mL — ABNORMAL LOW (ref 30.0–100.0)

## 2019-10-09 LAB — TSH: TSH: 2.12 u[IU]/mL (ref 0.450–4.500)

## 2019-10-10 LAB — DRUG SCREEN 10 W/CONF, SERUM
Amphetamines, IA: NEGATIVE ng/mL
Barbiturates, IA: NEGATIVE ug/mL
Benzodiazepines, IA: NEGATIVE ng/mL
Cocaine & Metabolite, IA: NEGATIVE ng/mL
Methadone, IA: NEGATIVE ng/mL
Opiates, IA: NEGATIVE ng/mL
Oxycodones, IA: NEGATIVE ng/mL
Phencyclidine, IA: NEGATIVE ng/mL
Propoxyphene, IA: NEGATIVE ng/mL
THC(Marijuana) Metabolite, IA: NEGATIVE ng/mL

## 2019-10-22 ENCOUNTER — Other Ambulatory Visit: Payer: Self-pay | Admitting: Family Medicine

## 2019-10-23 ENCOUNTER — Other Ambulatory Visit: Payer: Self-pay | Admitting: Family Medicine

## 2019-10-23 ENCOUNTER — Other Ambulatory Visit: Payer: Self-pay | Admitting: *Deleted

## 2019-10-24 ENCOUNTER — Other Ambulatory Visit: Payer: Self-pay

## 2019-10-24 ENCOUNTER — Other Ambulatory Visit: Payer: Self-pay | Admitting: *Deleted

## 2019-10-24 MED ORDER — LISINOPRIL 5 MG PO TABS: 5.0000 mg | ORAL_TABLET | Freq: Every day | ORAL | 0 refills | Status: DC

## 2019-10-24 MED ORDER — POLYETHYLENE GLYCOL 3350 17 GM/SCOOP PO POWD: ORAL | 0 refills | Status: DC

## 2019-10-24 NOTE — Telephone Encounter (Addendum)
Pharmacy has sent request for senna plus tablet. I do not see on patients medication list. Patient also needs refill on lisinopril rx is pending

## 2019-10-24 NOTE — Telephone Encounter (Signed)
Prescription sent to pharmacy.

## 2019-10-30 ENCOUNTER — Other Ambulatory Visit (HOSPITAL_COMMUNITY): Payer: Self-pay | Admitting: Psychiatry

## 2019-10-30 ENCOUNTER — Other Ambulatory Visit: Payer: Self-pay | Admitting: Family Medicine

## 2019-10-30 DIAGNOSIS — F3160 Bipolar disorder, current episode mixed, unspecified: Secondary | ICD-10-CM

## 2019-11-12 ENCOUNTER — Other Ambulatory Visit (HOSPITAL_COMMUNITY): Payer: Self-pay | Admitting: *Deleted

## 2019-11-12 DIAGNOSIS — F3160 Bipolar disorder, current episode mixed, unspecified: Secondary | ICD-10-CM

## 2019-11-12 MED ORDER — DIVALPROEX SODIUM 500 MG PO DR TAB: DELAYED_RELEASE_TABLET | ORAL | 0 refills | Status: DC

## 2019-11-19 ENCOUNTER — Other Ambulatory Visit: Payer: Self-pay

## 2019-11-19 ENCOUNTER — Telehealth (INDEPENDENT_AMBULATORY_CARE_PROVIDER_SITE_OTHER): Payer: Medicare Other | Admitting: Psychiatry

## 2019-11-19 ENCOUNTER — Encounter (HOSPITAL_COMMUNITY): Payer: Self-pay | Admitting: Psychiatry

## 2019-11-19 ENCOUNTER — Other Ambulatory Visit: Payer: Self-pay | Admitting: Family

## 2019-11-19 ENCOUNTER — Telehealth: Payer: Self-pay | Admitting: Family Medicine

## 2019-11-19 DIAGNOSIS — E559 Vitamin D deficiency, unspecified: Secondary | ICD-10-CM

## 2019-11-19 DIAGNOSIS — F411 Generalized anxiety disorder: Secondary | ICD-10-CM | POA: Diagnosis not present

## 2019-11-19 DIAGNOSIS — F3160 Bipolar disorder, current episode mixed, unspecified: Secondary | ICD-10-CM | POA: Diagnosis not present

## 2019-11-19 MED ORDER — TRAZODONE HCL 100 MG PO TABS: 100.0000 mg | ORAL_TABLET | Freq: Every day | ORAL | 1 refills | Status: DC

## 2019-11-19 MED ORDER — DIVALPROEX SODIUM 500 MG PO DR TAB: 500.0000 mg | DELAYED_RELEASE_TABLET | Freq: Two times a day (BID) | ORAL | 1 refills | Status: DC

## 2019-11-19 MED ORDER — RISPERIDONE 4 MG PO TABS: 4.0000 mg | ORAL_TABLET | Freq: Every day | ORAL | 1 refills | Status: DC

## 2019-11-19 MED ORDER — DULOXETINE HCL 60 MG PO CPEP: 60.0000 mg | ORAL_CAPSULE | Freq: Every day | ORAL | 1 refills | Status: DC

## 2019-11-19 MED ORDER — BENZTROPINE MESYLATE 1 MG PO TABS: 1.0000 mg | ORAL_TABLET | Freq: Every day | ORAL | 1 refills | Status: DC

## 2019-11-19 NOTE — Telephone Encounter (Signed)
Pt states that her psychologist would like her white blood cell count rechecked and pt would like to speak to a nurse regarding and to get an order to have that completed.

## 2019-11-19 NOTE — Progress Notes (Signed)
Virtual Visit via Telephone Note  I connected with Allison Sharp on 11/19/19 at 10:00 AM EDT by telephone and verified that I am speaking with the correct person using two identifiers.  Location: Patient: home Provider: home office   I discussed the limitations, risks, security and privacy concerns of performing an evaluation and management service by telephone and the availability of in person appointments. I also discussed with the patient that there may be a patient responsible charge related to this service. The patient expressed understanding and agreed to proceed.   History of Present Illness: Patient is evaluated by phone session.  She moved to her older sister Allison Sharp 3 weeks ago.  Patient told she was more comfortable living with her sister.  Also spoke to her Allison Sharp who mention that her living condition was not good when she was living with her niece Allison Sharp.  Even though Allison Sharp was helping her every day but also involve too much in her daily functioning.  Patient feels things are going well.  She is sleeping good.  She is helping her sister and more active.  She has no paranoia, anger, crying spells or any suicidal thoughts.  She is trying to lose weight and since the last visit she had lost 10 pounds.  She is eating healthy.  She is taking Depakote 500 mg twice a day and Respinol 4 mg only at bedtime.  She has no tremors shakes or any EPS.  She denies any mania, psychosis, hallucination or any anger.  Her cognition is also improved.  She is more active, walking every day.  She has no new side effects from the medication.  She like to keep her current medication.   Past Psychiatric History:Reviewed. H/Opsychiatric illness with multiple inpatient treatment. Last admissions inMay 2020 at [facility].H/Osuicidal attempt by taking overdose. Diagnosed with PTSD, depression, bipolar disorder. Tried Paxil, Prozac, Wellbutrin, Effexor, Lexapro, amitriptyline, Cymbalta, Abilify  (Tremors), Neurontin, trazodone, Thorazine, hydroxyzine, Luvox, Sinequan, Zyprexa and Latuda. Best responded on Risperdal and Cymbalta.  Recent Results (from the past 2160 hour(s))  CBC with Differential/Platelet     Status: None   Collection Time: 10/08/19  4:37 PM  Result Value Ref Range   WBC CANCELED x10E3/uL    Comment: [facility] was unable to collect sufficient specimen to perform the following test(s), and is providing the patient with re-collection instructions.  Result canceled by the ancillary.    RBC CANCELED     Comment: Test not performed  Result canceled by the ancillary.    Hemoglobin CANCELED     Comment: Test not performed  Result canceled by the ancillary.    Hematocrit CANCELED     Comment: Test not performed  Result canceled by the ancillary.    Platelets CANCELED     Comment: Test not performed  Result canceled by the ancillary.    Neutrophils CANCELED     Comment: Test not performed  Result canceled by the ancillary.    Lymphs CANCELED     Comment: Test not performed  Result canceled by the ancillary.    Monocytes CANCELED     Comment: Test not performed  Result canceled by the ancillary.    Eos CANCELED     Comment: Test not performed  Result canceled by the ancillary.    Lymphocytes Absolute CANCELED     Comment: Test not performed  Result canceled by the ancillary.    EOS (ABSOLUTE) CANCELED     Comment: Test not performed  Result  canceled by the ancillary.    Basophils Absolute CANCELED     Comment: Test not performed  Result canceled by the ancillary.   CMP14+EGFR     Status: Abnormal   Collection Time: 10/08/19  4:37 PM  Result Value Ref Range   Glucose 113 (H) 65 - 99 mg/dL   BUN 21 8 - 27 mg/dL   Creatinine, Ser 0.93 0.57 - 1.00 mg/dL   GFR calc non Af Amer 65 >59 mL/min/1.73   GFR calc Af Amer 75 >59 mL/min/1.73    Comment: **[facility] currently reports eGFR in compliance with the current**   recommendations of  the [employer]. [facility] will   update reporting as new guidelines are published from the NKF-ASN   Task force.    BUN/Creatinine Ratio 23 12 - 28   Sodium 138 134 - 144 mmol/L   Potassium 5.4 (H) 3.5 - 5.2 mmol/L   Chloride 102 96 - 106 mmol/L   CO2 27 20 - 29 mmol/L   Calcium 9.4 8.7 - 10.3 mg/dL   Total Protein 6.4 6.0 - 8.5 g/dL   Albumin 3.8 3.8 - 4.8 g/dL   Globulin, Total 2.6 1.5 - 4.5 g/dL   Albumin/Globulin Ratio 1.5 1.2 - 2.2   Bilirubin Total 0.3 0.0 - 1.2 mg/dL   Alkaline Phosphatase 70 39 - 117 IU/L   AST 16 0 - 40 IU/L   ALT 11 0 - 32 IU/L  Lipid panel     Status: Abnormal   Collection Time: 10/08/19  4:37 PM  Result Value Ref Range   Cholesterol, Total 160 100 - 199 mg/dL   Triglycerides 297 (H) 0 - 149 mg/dL   HDL 37 (L) >39 mg/dL   VLDL Cholesterol Cal 48 (H) 5 - 40 mg/dL   LDL Chol Calc (NIH) 75 0 - 99 mg/dL   Chol/HDL Ratio 4.3 0.0 - 4.4 ratio    Comment:                                   T. Chol/HDL Ratio                                             Men  Women                               1/2 Avg.Risk  3.4    3.3                                   Avg.Risk  5.0    4.4                                2X Avg.Risk  9.6    7.1                                3X Avg.Risk 23.4   11.0   VITAMIN D 25 Hydroxy (Vit-D Deficiency, Fractures)     Status: Abnormal   Collection Time: 10/08/19  4:37 PM  Result Value Ref Range  Vit D, 25-Hydroxy 24.0 (L) 30.0 - 100.0 ng/mL    Comment: Vitamin D deficiency has been defined by the [place] practice guideline as a level of serum 25-OH vitamin D less than 20 ng/mL (1,2). The Endocrine Society went on to further define vitamin D insufficiency as a level between 21 and 29 ng/mL (2). 1. IOM (Institute of Medicine). 2010. Dietary reference    intakes for calcium and D. [place]: The    [employer]. 2. Holick MF, Binkley Kensett, Bischoff-Ferrari HA, et al.     Evaluation, treatment, and prevention of vitamin D    deficiency: an Endocrine Society clinical practice    guideline. JCEM. 2011 Jul; 96(7):1911-30.   TSH     Status: None   Collection Time: 10/08/19  4:37 PM  Result Value Ref Range   TSH 2.120 0.450 - 4.500 uIU/mL  Drug Screen 10 W/Conf, Se     Status: None   Collection Time: 10/08/19  4:51 PM  Result Value Ref Range   Amphetamines, IA Negative Cutoff:50 ng/mL   Barbiturates, IA Negative Cutoff:0.1 ug/mL   Benzodiazepines, IA Negative Cutoff:20 ng/mL   Cocaine & Metabolite, IA Negative Cutoff:25 ng/mL   Phencyclidine, IA Negative Cutoff:8 ng/mL   THC(Marijuana) Metabolite, IA Negative Cutoff:5 ng/mL   Opiates, IA Negative Cutoff:5 ng/mL   Oxycodones, IA Negative Cutoff:5 ng/mL   Methadone, IA Negative Cutoff:25 ng/mL   Propoxyphene, IA Negative Cutoff:50 ng/mL    Comment: This test was developed and its performance characteristics determined by [facility].  It has not been cleared or approved by the Food and Drug Administration.       Psychiatric Specialty Exam: Physical Exam  Review of Systems  Weight (!) 388 lb (176 kg), last menstrual period 02/18/1995.Body mass index is 57.3 kg/m.  General Appearance: NA  Eye Contact:  NA  Speech:  Slow  Volume:  Decreased  Mood:  Euthymic  Affect:  NA  Thought Process:  Descriptions of Associations: Intact  Orientation:  Full (Time, Place, and Person)  Thought Content:  Logical  Suicidal Thoughts:  No  Homicidal Thoughts:  No  Memory:  Immediate;   Fair Recent;   Fair Remote;   Fair  Judgement:  Intact  Insight:  Fair  Psychomotor Activity:  NA  Concentration:  Concentration: Fair and Attention Span: Fair  Recall:  [employer] of Knowledge:  Fair  Language:  Good  Akathisia:  No  Handed:  Right  AIMS (if indicated):     Assets:  Communication Skills Desire for Improvement Housing Social Support  ADL's:  Intact  Cognition:  Impaired,  Mild  Sleep:   good       Assessment and Plan: Bipolar disorder type I.  Generalized anxiety disorder.  Mild cognitive impairment.   I reviewed blood work results.  Her TSH is normal.  Patient has cut down her Depakote and taking 500 mg twice a day.  She also cut down her Risperdal and only taking 4 mg at bedtime.  She is not taking 1 mg in the morning.  She is on oxygen.  Her sleep is improved.  We discussed polypharmacy.  I believe reducing the dose of medication helped her cognition.  I reminded that she need to get Depakote level and platelet count.  I reminded that Depakote can cause low platelet count.  She elected to change the Depakote since it does work very well.  Continue Risperdal 4 mg at bedtime,  Cymbalta 60 mg daily, Cogentin 1 mg daily, trazodone 100 mg at bedtime and Depakote 5 mg twice a day.  Discussed medication side effects and benefits.  Recommended to call us back if she has any question or any concern.  Follow-up in 3 months.  Time spent 25 minutes.  Follow Up Instructions:    I discussed the assessment and treatment plan with the patient. The patient was provided an opportunity to ask questions and all were answered. The patient agreed with the plan and demonstrated an understanding of the instructions.   The patient was advised to call back or seek an in-person evaluation if the symptoms worsen or if the condition fails to improve as anticipated.  I provided 25 minutes of non-face-to-face time during this encounter.   Kathlee Nations, MD

## 2019-11-19 NOTE — Telephone Encounter (Signed)
Pt advised to come between 8-4:30 M-F.  95m follow up also made with Dr. Warrick Parisian in August

## 2019-11-19 NOTE — Progress Notes (Deleted)
Virtual Visit via Telephone Note  I connected with ELINE GENG on 11/19/19 at 10:00 AM EDT by telephone and verified that I am speaking with the correct person using two identifiers.  Location: Patient: Home Provider: Home office   I discussed the limitations, risks, security and privacy concerns of performing an evaluation and management service by telephone and the availability of in person appointments. I also discussed with the patient that there may be a patient responsible charge related to this service. The patient expressed understanding and agreed to proceed.   History of Present Illness: Patient is evaluated by phone session.  She now moved to her older sister [name].  She moved 3 weeks ago because she is more comfortable living with her sister.  She feels things are going well.  She is taking Depakote 500 mg twice a day and Risperdal only at bedtime.  She feels her memory is improved.  She denies any crying spells or any feeling of hopelessness.  Her sleep is improved.  She has no paranoia, anger, feelings of hopelessness or any suicidal thoughts.  She is trying to lose weight and she had lost 10 pounds since the last visit.  I also spoke to her sister [name] who mentioned that her living condition was not good when she was living with her niece and her niece was helping her but also involved in her daily functioning.  Since she moved to her sister's house she is more active, walking and helping in daily routine to her sister.  Her anxiety is under control and her mania and mood swings are also much better with the current medication.  She has no tremors or shakes.  She takes Cogentin.   Past Psychiatric History:Reviewed. H/Opsychiatric illness with multiple inpatient treatment. Last admissions inMay 2020 at [facility].H/Osuicidal attempt by taking overdose. Diagnosed with PTSD, depression, bipolar disorder. Tried Paxil, Prozac, Wellbutrin, Effexor, Lexapro,  amitriptyline, Cymbalta, Abilify (Tremors), Neurontin, trazodone, Thorazine, hydroxyzine, Luvox, Sinequan, Zyprexa and Latuda. Best responded on Risperdal and Cymbalta.  Recent Results (from the past 2160 hour(s))  CBC with Differential/Platelet     Status: None   Collection Time: 10/08/19  4:37 PM  Result Value Ref Range   WBC CANCELED x10E3/uL    Comment: [facility] was unable to collect sufficient specimen to perform the following test(s), and is providing the patient with re-collection instructions.  Result canceled by the ancillary.    RBC CANCELED     Comment: Test not performed  Result canceled by the ancillary.    Hemoglobin CANCELED     Comment: Test not performed  Result canceled by the ancillary.    Hematocrit CANCELED     Comment: Test not performed  Result canceled by the ancillary.    Platelets CANCELED     Comment: Test not performed  Result canceled by the ancillary.    Neutrophils CANCELED     Comment: Test not performed  Result canceled by the ancillary.    Lymphs CANCELED     Comment: Test not performed  Result canceled by the ancillary.    Monocytes CANCELED     Comment: Test not performed  Result canceled by the ancillary.    Eos CANCELED     Comment: Test not performed  Result canceled by the ancillary.    Lymphocytes Absolute CANCELED     Comment: Test not performed  Result canceled by the ancillary.    EOS (ABSOLUTE) CANCELED     Comment: Test not performed  Result canceled by the ancillary.    Basophils Absolute CANCELED     Comment: Test not performed  Result canceled by the ancillary.   CMP14+EGFR     Status: Abnormal   Collection Time: 10/08/19  4:37 PM  Result Value Ref Range   Glucose 113 (H) 65 - 99 mg/dL   BUN 21 8 - 27 mg/dL   Creatinine, Ser 0.93 0.57 - 1.00 mg/dL   GFR calc non Af Amer 65 >59 mL/min/1.73   GFR calc Af Amer 75 >59 mL/min/1.73    Comment: **[facility] currently reports eGFR in compliance with  the current**   recommendations of the [employer]. [facility] will   update reporting as new guidelines are published from the NKF-ASN   Task force.    BUN/Creatinine Ratio 23 12 - 28   Sodium 138 134 - 144 mmol/L   Potassium 5.4 (H) 3.5 - 5.2 mmol/L   Chloride 102 96 - 106 mmol/L   CO2 27 20 - 29 mmol/L   Calcium 9.4 8.7 - 10.3 mg/dL   Total Protein 6.4 6.0 - 8.5 g/dL   Albumin 3.8 3.8 - 4.8 g/dL   Globulin, Total 2.6 1.5 - 4.5 g/dL   Albumin/Globulin Ratio 1.5 1.2 - 2.2   Bilirubin Total 0.3 0.0 - 1.2 mg/dL   Alkaline Phosphatase 70 39 - 117 IU/L   AST 16 0 - 40 IU/L   ALT 11 0 - 32 IU/L  Lipid panel     Status: Abnormal   Collection Time: 10/08/19  4:37 PM  Result Value Ref Range   Cholesterol, Total 160 100 - 199 mg/dL   Triglycerides 297 (H) 0 - 149 mg/dL   HDL 37 (L) >39 mg/dL   VLDL Cholesterol Cal 48 (H) 5 - 40 mg/dL   LDL Chol Calc (NIH) 75 0 - 99 mg/dL   Chol/HDL Ratio 4.3 0.0 - 4.4 ratio    Comment:                                   T. Chol/HDL Ratio                                             Men  Women                               1/2 Avg.Risk  3.4    3.3                                   Avg.Risk  5.0    4.4                                2X Avg.Risk  9.6    7.1                                3X Avg.Risk 23.4   11.0   VITAMIN D 25 Hydroxy (Vit-D Deficiency, Fractures)     Status: Abnormal   Collection Time: 10/08/19  4:37 PM  Result Value Ref Range  Vit D, 25-Hydroxy 24.0 (L) 30.0 - 100.0 ng/mL    Comment: Vitamin D deficiency has been defined by the [place] practice guideline as a level of serum 25-OH vitamin D less than 20 ng/mL (1,2). The Endocrine Society went on to further define vitamin D insufficiency as a level between 21 and 29 ng/mL (2). 1. IOM (Institute of Medicine). 2010. Dietary reference    intakes for calcium and D. [place]: The    [employer]. 2. Holick MF, Binkley  [place], Bischoff-Ferrari HA, et al.    Evaluation, treatment, and prevention of vitamin D    deficiency: an Endocrine Society clinical practice    guideline. JCEM. 2011 Jul; 96(7):1911-30.   TSH     Status: None   Collection Time: 10/08/19  4:37 PM  Result Value Ref Range   TSH 2.120 0.450 - 4.500 uIU/mL  Drug Screen 10 W/Conf, Se     Status: None   Collection Time: 10/08/19  4:51 PM  Result Value Ref Range   Amphetamines, IA Negative Cutoff:50 ng/mL   Barbiturates, IA Negative Cutoff:0.1 ug/mL   Benzodiazepines, IA Negative Cutoff:20 ng/mL   Cocaine & Metabolite, IA Negative Cutoff:25 ng/mL   Phencyclidine, IA Negative Cutoff:8 ng/mL   THC(Marijuana) Metabolite, IA Negative Cutoff:5 ng/mL   Opiates, IA Negative Cutoff:5 ng/mL   Oxycodones, IA Negative Cutoff:5 ng/mL   Methadone, IA Negative Cutoff:25 ng/mL   Propoxyphene, IA Negative Cutoff:50 ng/mL    Comment: This test was developed and its performance characteristics determined by [facility].  It has not been cleared or approved by the Food and Drug Administration.      Psychiatric Specialty Exam: Physical Exam  Review of Systems  Weight (!) 388 lb (176 kg), last menstrual period 02/18/1995.There is no height or weight on file to calculate BMI.  General Appearance: NA  Eye Contact:  NA  Speech:  Slow  Volume:  Decreased  Mood:  Euthymic  Affect:  NA  Thought Process:  Goal Directed  Orientation:  Full (Time, Place, and Person)  Thought Content:  Logical  Suicidal Thoughts:  No  Homicidal Thoughts:  No  Memory:  Immediate;   Fair Recent;   Fair Remote;   Fair  Judgement:  Intact  Insight:  Present  Psychomotor Activity:  NA  Concentration:  Concentration: Fair and Attention Span: Fair  Recall:  [employer] of Knowledge:  Fair  Language:  Good  Akathisia:  No  Handed:  Right  AIMS (if indicated):     Assets:  Communication Skills Desire for Improvement Housing Social Support  ADL's:  Intact  Cognition:  WNL   Sleep:   good      Assessment and Plan: Bipolar disorder type I.  Generalized anxiety disorder.  Mild cognitive impairment.  Patient has cut down her Depakote and only take 500 mg twice a day and Risperdal 4 mg at bedtime.  She is more active during the day and not taking the Risperdal in the morning.  She still requires oxygen all the time.  Her sleep is improved.  We discussed polypharmacy.  I do believe cutting down the rest Adderall in the morning and Depakote at night helped her cognition.  I do remind her that she need to get the Depakote level and platelet count which was low as Depakote can cause low platelet.  Patient is reluctant to change the Depakote since it does work radiographs.  I agree.  Continue Risperdal 4  mg at bedtime, Cymbalta 60 mg daily, Cogentin 1 mg daily, trazodone 100 mg at bedtime and Depakote 500 mg twice a day.  Recommended to call us back if she is any question or any concern.  She is also taking other medicine for her basic Needs.  I reviewed blood work results.  Her TSH is normal.  Recommended to call us back if she is any question or any concern.  Follow-up in 2 months.  Follow Up Instructions:    I discussed the assessment and treatment plan with the patient. The patient was provided an opportunity to ask questions and all were answered. The patient agreed with the plan and demonstrated an understanding of the instructions.   The patient was advised to call back or seek an in-person evaluation if the symptoms worsen or if the condition fails to improve as anticipated.  I provided 20 minutes of non-face-to-face time during this encounter.   Kathlee Nations, MD

## 2019-11-19 NOTE — Addendum Note (Signed)
Addended by: Alphonzo Dublin on: 11/19/2019 10:22 AM   Modules accepted: Orders

## 2019-11-20 ENCOUNTER — Other Ambulatory Visit: Payer: Self-pay | Admitting: Family Medicine

## 2019-11-26 ENCOUNTER — Other Ambulatory Visit: Payer: Self-pay | Admitting: Family Medicine

## 2019-12-09 ENCOUNTER — Other Ambulatory Visit: Payer: Self-pay

## 2019-12-09 ENCOUNTER — Other Ambulatory Visit: Payer: Medicare Other

## 2019-12-09 DIAGNOSIS — E559 Vitamin D deficiency, unspecified: Secondary | ICD-10-CM | POA: Diagnosis not present

## 2019-12-10 LAB — CBC WITH DIFFERENTIAL/PLATELET
Basophils Absolute: 0.1 10*3/uL (ref 0.0–0.2)
Basos: 1 %
EOS (ABSOLUTE): 0.3 10*3/uL (ref 0.0–0.4)
Eos: 4 %
Hematocrit: 40.1 % (ref 34.0–46.6)
Hemoglobin: 13.3 g/dL (ref 11.1–15.9)
Immature Grans (Abs): 0 10*3/uL (ref 0.0–0.1)
Immature Granulocytes: 0 %
Lymphocytes Absolute: 2.7 10*3/uL (ref 0.7–3.1)
Lymphs: 39 %
MCH: 31.4 pg (ref 26.6–33.0)
MCHC: 33.2 g/dL (ref 31.5–35.7)
MCV: 95 fL (ref 79–97)
Monocytes Absolute: 0.6 10*3/uL (ref 0.1–0.9)
Monocytes: 8 %
Neutrophils Absolute: 3.3 10*3/uL (ref 1.4–7.0)
Neutrophils: 48 %
Platelets: 154 10*3/uL (ref 150–450)
RBC: 4.23 x10E6/uL (ref 3.77–5.28)
RDW: 13.7 % (ref 11.7–15.4)
WBC: 6.9 10*3/uL (ref 3.4–10.8)

## 2020-01-02 ENCOUNTER — Other Ambulatory Visit: Payer: Self-pay | Admitting: Family Medicine

## 2020-01-13 ENCOUNTER — Other Ambulatory Visit: Payer: Self-pay | Admitting: Family

## 2020-01-14 ENCOUNTER — Ambulatory Visit: Payer: Medicare Other | Admitting: Family Medicine

## 2020-01-15 ENCOUNTER — Other Ambulatory Visit (HOSPITAL_COMMUNITY): Payer: Self-pay | Admitting: Psychiatry

## 2020-01-15 DIAGNOSIS — F3160 Bipolar disorder, current episode mixed, unspecified: Secondary | ICD-10-CM

## 2020-01-19 ENCOUNTER — Other Ambulatory Visit: Payer: Self-pay

## 2020-01-19 ENCOUNTER — Telehealth (INDEPENDENT_AMBULATORY_CARE_PROVIDER_SITE_OTHER): Payer: Medicare Other | Admitting: Psychiatry

## 2020-01-19 ENCOUNTER — Encounter (HOSPITAL_COMMUNITY): Payer: Self-pay | Admitting: Psychiatry

## 2020-01-19 VITALS — Wt 368.0 lb

## 2020-01-19 DIAGNOSIS — F411 Generalized anxiety disorder: Secondary | ICD-10-CM

## 2020-01-19 DIAGNOSIS — F3160 Bipolar disorder, current episode mixed, unspecified: Secondary | ICD-10-CM

## 2020-01-19 DIAGNOSIS — G3184 Mild cognitive impairment, so stated: Secondary | ICD-10-CM

## 2020-01-19 MED ORDER — DULOXETINE HCL 60 MG PO CPEP: 60.0000 mg | ORAL_CAPSULE | Freq: Every morning | ORAL | 1 refills | Status: DC

## 2020-01-19 MED ORDER — DIVALPROEX SODIUM 500 MG PO DR TAB: 500.0000 mg | DELAYED_RELEASE_TABLET | Freq: Two times a day (BID) | ORAL | 1 refills | Status: DC

## 2020-01-19 MED ORDER — RISPERIDONE 4 MG PO TABS: 4.0000 mg | ORAL_TABLET | Freq: Every day | ORAL | 1 refills | Status: DC

## 2020-01-19 MED ORDER — BENZTROPINE MESYLATE 1 MG PO TABS: 1.0000 mg | ORAL_TABLET | Freq: Every day | ORAL | 1 refills | Status: DC

## 2020-01-19 MED ORDER — TRAZODONE HCL 100 MG PO TABS: 100.0000 mg | ORAL_TABLET | Freq: Every day | ORAL | 1 refills | Status: DC

## 2020-01-19 NOTE — Progress Notes (Signed)
Virtual Visit via Telephone Note  I connected with Allison Sharp on 01/19/20 at  2:00 PM EDT by telephone and verified that I am speaking with the correct person using two identifiers.  Location: Patient: home Provider: home office   I discussed the limitations, risks, security and privacy concerns of performing an evaluation and management service by telephone and the availability of in person appointments. I also discussed with the patient that there may be a patient responsible charge related to this service. The patient expressed understanding and agreed to proceed.   History of Present Illness: Patient is evaluated by phone session.  She is doing well but during the day she feels tired and sometimes groggy.  But overall she is more active and happy as she is very happy living with her older Sister Allison Sharp.  She denies any hallucination, paranoia or any suicidal thoughts.  She has difficulty remembering things.  She lost another 10 pounds since the last visit.  She has no tremors, shakes or any EPS.  She denies any crying spells.  She denies any highs and lows.  Her niece and he is not as much involved in her daily life.  When I question about medication dosage and compliance patient told that she is a still taking Risperdal 1 mg in the morning but not sure why because her pharmacy keep giving it to her.  All her other medicine she is taking as prescribed.   Past Psychiatric History:Reviewed. H/Opsychiatric illness with multiple inpatient treatment. [facility] admissions [facility]May 2020 at [facility].H/Osuicidal attempt by taking overdose. Diagnosed with PTSD, depression, bipolar disorder. Tried Paxil, Prozac, Wellbutrin, Effexor, Lexapro, amitriptyline, Cymbalta, Abilify (Tremors), Neurontin, trazodone, Thorazine, hydroxyzine, Luvox, Sinequan, Zyprexa and Latuda. Best responded on Risperdal and Cymbalta.   Psychiatric Specialty Exam: Physical Exam  Review of Systems  Weight (!)  368 lb (166.9 kg), last menstrual period 02/18/1995.There is no height or weight on file to calculate BMI.  General Appearance: NA  Eye Contact:  NA  Speech:  Slow  Volume:  Decreased  Mood:  Anxious  Affect:  NA  Thought Process:  Descriptions of Associations: Intact  Orientation:  Full (Time, Place, and Person)  Thought Content:  Rumination  Suicidal Thoughts:  No  Homicidal Thoughts:  No  Memory:  Immediate;   Fair Recent;   Fair Remote;   Fair  Judgement:  Intact  Insight:  Present  Psychomotor Activity:  NA  Concentration:  Concentration: Fair and Attention Span: Fair  Recall:  [employer] of Knowledge:  Fair  Language:  Good  Akathisia:  No  Handed:  Right  AIMS (if indicated):     Assets:  Communication Skills Desire for Improvement Housing Resilience Social Support  ADL's:  Intact  Cognition:  Impaired,  Mild  Sleep:   fair      Assessment and Plan: Bipolar disorder type I.  Generalized anxiety disorder.  Mild cognitive impairment.  I reviewed the medication list with her.  Recommended not to take Respinol 1 mg in the morning and just stay on 4 mg at bedtime.  I explained this may be a reason that she is more tired during the day.  We discussed polypharmacy but patient does not like to cut down any more medication since she is doing well.  She is walking every day and she is more motivated to do things.  She uses oxygen at bedtime.  We talked about getting Depakote level and platelet count.  Patient is scheduled  to see her PCP on October 30.  Continue Risperdal 4 mg only at bedtime, trazodone 100 mg at bedtime, Depakote 500 mg twice a day, Cymbalta 60 mg daily and Cogentin 1 mg at bedtime.  Discussed medication side effects and benefits.  Recommended to call us back if she has any question or any concern.  Follow-up in 2 months.  Patient like to keep the appointment in 2 months for now.  Follow Up Instructions:    I discussed the assessment and treatment plan with  the patient. The patient was provided an opportunity to ask questions and all were answered. The patient agreed with the plan and demonstrated an understanding of the instructions.   The patient was advised to call back or seek an in-person evaluation if the symptoms worsen or if the condition fails to improve as anticipated.  I provided 20 minutes of non-face-to-face time during this encounter.   Kathlee Nations, MD

## 2020-01-26 ENCOUNTER — Other Ambulatory Visit: Payer: Self-pay

## 2020-01-26 ENCOUNTER — Ambulatory Visit (INDEPENDENT_AMBULATORY_CARE_PROVIDER_SITE_OTHER): Payer: Medicare Other | Admitting: Family Medicine

## 2020-01-26 ENCOUNTER — Encounter: Payer: Self-pay | Admitting: Family Medicine

## 2020-01-26 VITALS — HR 83 | Temp 97.5°F | Ht 69.0 in | Wt 380.0 lb

## 2020-01-26 DIAGNOSIS — K219 Gastro-esophageal reflux disease without esophagitis: Secondary | ICD-10-CM

## 2020-01-26 DIAGNOSIS — J4531 Mild persistent asthma with (acute) exacerbation: Secondary | ICD-10-CM

## 2020-01-26 DIAGNOSIS — M545 Low back pain, unspecified: Secondary | ICD-10-CM

## 2020-01-26 DIAGNOSIS — I10 Essential (primary) hypertension: Secondary | ICD-10-CM

## 2020-01-26 DIAGNOSIS — E785 Hyperlipidemia, unspecified: Secondary | ICD-10-CM | POA: Diagnosis not present

## 2020-01-26 DIAGNOSIS — E039 Hypothyroidism, unspecified: Secondary | ICD-10-CM

## 2020-01-26 DIAGNOSIS — J0110 Acute frontal sinusitis, unspecified: Secondary | ICD-10-CM

## 2020-01-26 DIAGNOSIS — G8929 Other chronic pain: Secondary | ICD-10-CM

## 2020-01-26 MED ORDER — LISINOPRIL 5 MG PO TABS
5.0000 mg | ORAL_TABLET | Freq: Every day | ORAL | 3 refills | Status: DC
Start: 2020-01-26 — End: 2021-03-14

## 2020-01-26 MED ORDER — OMEPRAZOLE 20 MG PO CPDR: 20.0000 mg | DELAYED_RELEASE_CAPSULE | Freq: Every day | ORAL | 3 refills | Status: DC

## 2020-01-26 MED ORDER — CLONIDINE HCL 0.1 MG PO TABS
0.1000 mg | ORAL_TABLET | Freq: Every day | ORAL | 3 refills | Status: DC
Start: 2020-01-26 — End: 2021-02-10

## 2020-01-26 MED ORDER — LEVOTHYROXINE SODIUM 125 MCG PO TABS
125.0000 ug | ORAL_TABLET | Freq: Every day | ORAL | 3 refills | Status: DC
Start: 2020-01-26 — End: 2021-03-11

## 2020-01-26 MED ORDER — ALBUTEROL SULFATE HFA 108 (90 BASE) MCG/ACT IN AERS: INHALATION_SPRAY | RESPIRATORY_TRACT | 5 refills | Status: DC

## 2020-01-26 MED ORDER — BREO ELLIPTA 100-25 MCG/INH IN AEPB: 1.0000 | INHALATION_SPRAY | Freq: Every day | RESPIRATORY_TRACT | 3 refills | Status: DC

## 2020-01-26 MED ORDER — ATORVASTATIN CALCIUM 20 MG PO TABS: 20.0000 mg | ORAL_TABLET | Freq: Every day | ORAL | 3 refills | Status: DC

## 2020-01-26 MED ORDER — TRAMADOL HCL 50 MG PO TABS: 50.0000 mg | ORAL_TABLET | Freq: Three times a day (TID) | ORAL | 2 refills | Status: DC | PRN

## 2020-01-26 MED ORDER — CELECOXIB 200 MG PO CAPS
200.0000 mg | ORAL_CAPSULE | Freq: Two times a day (BID) | ORAL | 3 refills | Status: DC
Start: 2020-01-26 — End: 2021-07-14

## 2020-01-26 NOTE — Progress Notes (Signed)
Pulse 83   Temp (!) 97.5 F (36.4 C)   Ht '5\' 9"'  (1.753 m)   Wt (!) 380 lb (172.4 kg)   LMP 02/18/1995   SpO2 96%   BMI 56.12 kg/m    Subjective:   Patient ID: Allison Sharp, female    DOB: [birth year], 64 y.o.   MRN: [id number]  HPI: Allison Sharp is a 64 y.o. female presenting on 01/26/2020 for Medical Management of Chronic Issues and Hypothyroidism   HPI COPD Patient is coming in for COPD recheck today.  He is currently on albuterol and Breo.  He has a mild chronic cough but denies any major coughing spells or wheezing spells.  He has 0nighttime symptoms per week and 0daytime symptoms per week currently.  She says she is doing a lot better since she is now living with her sister  Hyperlipidemia Patient is coming in for recheck of his hyperlipidemia. The patient is currently taking atorvastatin. They deny any issues with myalgias or history of liver damage from it. They deny any focal numbness or weakness or chest pain.   Hypothyroidism recheck Patient is coming in for thyroid recheck today as well. They deny any issues with hair changes or heat or cold problems or diarrhea or constipation. They deny any chest pain or palpitations. They are currently on levothyroxine 125 micrograms   GERD Patient is currently on promethazine and omeprazole.  She denies any major symptoms or abdominal pain or belching or burping. She denies any blood in her stool or lightheadedness or dizziness.   Hypertension Patient is currently on clonidine and lisinopril, and their blood pressure today is 108/66. Patient denies any lightheadedness or dizziness. Patient denies headaches, blurred vision, chest pains, shortness of breath, or weakness. Denies any side effects from medication and is content with current medication.   Pain assessment: Cause of pain-chronic back pain and hip pain and knee pain due to morbid obesity Pain location-mostly back but also hips and knees Pain on scale of 1-10-  2 Frequency-most days What increases pain-movement or being on her feet. What makes pain Better-rest and diclofenac Effects on ADL -due to obesity, has limited mobility but is starting to walk more Any change in general medical condition-none currently  Current opioids rx-tramadol 50 mg 3 times daily as needed # meds rx-90 Effectiveness of current meds-works well Adverse reactions form pain meds-none Morphine equivalent-15  Pill count performed-No Last drug screen -10/14/2019 ( high risk q82m moderate risk q627mlow risk yearly ) Urine drug screen today- No Was the [place]-yes  If yes were their any concerning findings? -None  No flowsheet data found.   Pain contract signed on: 10/14/19  Relevant past medical, surgical, family and social history reviewed and updated as indicated. Interim medical history since our last visit reviewed. Allergies and medications reviewed and updated.  Review of Systems  Constitutional: Negative for chills and fever.  Eyes: Negative for visual disturbance.  Respiratory: Negative for chest tightness and shortness of breath.   Cardiovascular: Negative for chest pain and leg swelling.  Musculoskeletal: Positive for arthralgias, back pain and gait problem.  Skin: Negative for rash.  Neurological: Negative for light-headedness and headaches.  Psychiatric/Behavioral: Negative for agitation, behavioral problems, self-injury, sleep disturbance and suicidal ideas. The patient is not nervous/anxious.   All other systems reviewed and are negative.   Per HPI unless specifically indicated above   Allergies as of 01/26/2020      Reactions   Abilify [aripiprazole] Other (  See Comments)   hallucinations   Naproxen Nausea And Vomiting, Swelling   Ativan [lorazepam] Other (See Comments)   Delirium   Haldol [haloperidol] Other (See Comments)   Hallucinating    Hydroxyzine Other (See Comments)   hallucinations      Medication List       Accurate  as of January 26, 2020  3:30 PM. If you have any questions, ask your nurse or doctor.        albuterol (2.5 MG/3ML) 0.083% nebulizer solution Commonly known as: PROVENTIL Take 3 mLs (2.5 mg total) by nebulization every 4 (four) hours as needed for wheezing or shortness of breath.   albuterol 108 (90 Base) MCG/ACT inhaler Commonly known as: ProAir HFA USE 2 PUFF EVERY 4 HOURS AS NEEDED FOR WHEEZING OR SHORTNESS OF BREATH   atorvastatin 20 MG tablet Commonly known as: LIPITOR TAKE 1 TABLET DAILY   benztropine 1 MG tablet Commonly known as: COGENTIN Take 1 tablet (1 mg total) by mouth at bedtime.   Breo Ellipta 100-25 MCG/INH Aepb Generic drug: fluticasone furoate-vilanterol INHALE 1 PUFF INTO THE LUNGS DAILY   budesonide-formoterol 80-4.5 MCG/ACT inhaler Commonly known as: SYMBICORT Inhale 2 puffs into the lungs in the morning and at bedtime.   celecoxib 200 MG capsule Commonly known as: CELEBREX TAKE (1) CAPSULE TWICE DAILY.   cetirizine 10 MG tablet Commonly known as: ZYRTEC TAKE 1 TABLET ONCE DAILY   cloNIDine 0.1 MG tablet Commonly known as: CATAPRES TAKE ONE TABLET AT BEDTIME   diclofenac sodium 1 % Gel Commonly known as: Voltaren Apply to affected area twice daily as needed What changed:   how much to take  how to take this  when to take this  reasons to take this  additional instructions   divalproex 500 MG DR tablet Commonly known as: DEPAKOTE Take 1 tablet (500 mg total) by mouth 2 (two) times daily.   DULoxetine 60 MG capsule Commonly known as: CYMBALTA Take 1 capsule (60 mg total) by mouth in the morning.   fluticasone 50 MCG/ACT nasal spray Commonly known as: FLONASE Place 1 spray 2 (two) times daily as needed into both nostrils for allergies or rhinitis.   levothyroxine 125 MCG tablet Commonly known as: SYNTHROID TAKE 1 TABLET DAILY   lisinopril 5 MG tablet Commonly known as: ZESTRIL TAKE 1 TABLET ONCE DAILY   nystatin  cream Commonly known as: MYCOSTATIN APPLY TO THE AFFECTED AREA THREE TIMES DAILY AS NEEDED .Marland KitchenFOR TOPICAL USE ONLY... What changed: See the new instructions.   omeprazole 20 MG capsule Commonly known as: PRILOSEC TAKE (1) CAPSULE DAILY   ondansetron 4 MG disintegrating tablet Commonly known as: ZOFRAN-ODT DISSOLVE 1 TABLET IN MOUTH EVERY 8 HOURS AS NEEDED FOR NAUSEA AND VOMITING   OXYGEN Inhale 2 L into the lungs daily.   polyethylene glycol powder 17 GM/SCOOP powder Commonly known as: GLYCOLAX/MIRALAX DISSOLVE 17 GM SCOOP IN 8 OZ. OF WATER AND DRINK ONCE DAILY AS NEEDED FOR CONSTIPATION   promethazine 25 MG tablet Commonly known as: PHENERGAN Take 1 tablet (25 mg total) by mouth every 8 (eight) hours as needed for nausea or vomiting.   risperidone 4 MG tablet Commonly known as: RISPERDAL Take 1 tablet (4 mg total) by mouth at bedtime.   therapeutic multivitamin-minerals tablet Take 1 tablet by mouth daily.   traMADol 50 MG tablet Commonly known as: ULTRAM Take 1 tablet (50 mg total) by mouth every 8 (eight) hours as needed for moderate pain.   traZODone 100  MG tablet Commonly known as: DESYREL Take 1 tablet (100 mg total) by mouth at bedtime.   Vitamin D (Ergocalciferol) 1.25 MG (50000 UNIT) Caps capsule Commonly known as: DRISDOL TAKE 1 CAPSULE EVERY 7 SEVEN DAYS        Objective:   Pulse 83   Temp (!) 97.5 F (36.4 C)   Ht '5\' 9"'  (1.753 m)   Wt (!) 380 lb (172.4 kg)   LMP 02/18/1995   SpO2 96%   BMI 56.12 kg/m   Wt Readings from Last 3 Encounters:  01/26/20 (!) 380 lb (172.4 kg)  10/08/19 (!) 398 lb (180.5 kg)  05/12/19 (!) 399 lb 12.8 oz (181.3 kg)    Physical Exam Vitals and nursing note reviewed.  Constitutional:      General: She is not in acute distress.    Appearance: She is well-developed. She is not diaphoretic.  Eyes:     Conjunctiva/sclera: Conjunctivae normal.  Cardiovascular:     Rate and Rhythm: Normal rate and regular rhythm.      Heart sounds: Normal heart sounds. No murmur heard.   Pulmonary:     Effort: Pulmonary effort is normal. No respiratory distress.     Breath sounds: Normal breath sounds. No wheezing.  Skin:    General: Skin is warm and dry.     Findings: No rash.  Neurological:     Mental Status: She is alert and oriented to person, place, and time.     Coordination: Coordination normal.  Psychiatric:        Behavior: Behavior normal.       Assessment & Plan:   Problem List Items Addressed This Visit      Cardiovascular and Mediastinum   Essential hypertension, benign (Chronic)   Relevant Medications   atorvastatin (LIPITOR) 20 MG tablet   lisinopril (ZESTRIL) 5 MG tablet   cloNIDine (CATAPRES) 0.1 MG tablet   Other Relevant Orders   CMP14+EGFR (Completed)     Digestive   GERD (Chronic)   Relevant Medications   omeprazole (PRILOSEC) 20 MG capsule   Other Relevant Orders   CBC with Differential/Platelet (Completed)     Endocrine   Hypothyroidism - Primary (Chronic)   Relevant Medications   levothyroxine (SYNTHROID) 125 MCG tablet   Other Relevant Orders   TSH (Completed)     Other   Morbid obesity (HCC) (Chronic)   Back pain, chronic   Relevant Medications   traMADol (ULTRAM) 50 MG tablet   celecoxib (CELEBREX) 200 MG capsule   Hyperlipidemia LDL goal <130   Relevant Medications   atorvastatin (LIPITOR) 20 MG tablet   lisinopril (ZESTRIL) 5 MG tablet   cloNIDine (CATAPRES) 0.1 MG tablet   Other Relevant Orders   Lipid panel (Completed)    Other Visit Diagnoses    Mild persistent asthma with acute exacerbation       Added Breo to see if that would help   Relevant Medications   fluticasone furoate-vilanterol (BREO ELLIPTA) 100-25 MCG/INH AEPB   albuterol (PROAIR HFA) 108 (90 Base) MCG/ACT inhaler   Acute non-recurrent frontal sinusitis       Relevant Medications   albuterol (PROAIR HFA) 108 (90 Base) MCG/ACT inhaler      Continue current medication, no  changes Follow up plan: Return in about 3 months (around 04/27/2020), or if symptoms worsen or fail to improve, for thyroid and pain.  Counseling provided for all of the vaccine components No orders of the defined types were placed in this encounter.  [name], MD [facility] 01/26/2020, 3:30 PM

## 2020-01-27 LAB — CMP14+EGFR
ALT: 10 IU/L (ref 0–32)
AST: 15 IU/L (ref 0–40)
Albumin/Globulin Ratio: 2 (ref 1.2–2.2)
Albumin: 4.3 g/dL (ref 3.8–4.8)
Alkaline Phosphatase: 82 IU/L (ref 48–121)
BUN/Creatinine Ratio: 15 (ref 12–28)
BUN: 13 mg/dL (ref 8–27)
Bilirubin Total: 0.3 mg/dL (ref 0.0–1.2)
CO2: 27 mmol/L (ref 20–29)
Calcium: 9.5 mg/dL (ref 8.7–10.3)
Chloride: 102 mmol/L (ref 96–106)
Creatinine, Ser: 0.86 mg/dL (ref 0.57–1.00)
GFR calc Af Amer: 82 mL/min/{1.73_m2} (ref >59)
GFR calc non Af Amer: 71 mL/min/{1.73_m2} (ref >59)
Globulin, Total: 2.2 g/dL (ref 1.5–4.5)
Glucose: 104 mg/dL — ABNORMAL HIGH (ref 65–99)
Potassium: 4.1 mmol/L (ref 3.5–5.2)
Sodium: 141 mmol/L (ref 134–144)
Total Protein: 6.5 g/dL (ref 6.0–8.5)

## 2020-01-27 LAB — CBC WITH DIFFERENTIAL/PLATELET
Basophils Absolute: 0.1 10*3/uL (ref 0.0–0.2)
Basos: 1 %
EOS (ABSOLUTE): 0.2 10*3/uL (ref 0.0–0.4)
Eos: 3 %
Hematocrit: 38.2 % (ref 34.0–46.6)
Hemoglobin: 13.3 g/dL (ref 11.1–15.9)
Immature Grans (Abs): 0 10*3/uL (ref 0.0–0.1)
Immature Granulocytes: 0 %
Lymphocytes Absolute: 2.3 10*3/uL (ref 0.7–3.1)
Lymphs: 33 %
MCH: 31.6 pg (ref 26.6–33.0)
MCHC: 34.8 g/dL (ref 31.5–35.7)
MCV: 91 fL (ref 79–97)
Monocytes Absolute: 0.5 10*3/uL (ref 0.1–0.9)
Monocytes: 7 %
Neutrophils Absolute: 3.9 10*3/uL (ref 1.4–7.0)
Neutrophils: 56 %
Platelets: 154 10*3/uL (ref 150–450)
RBC: 4.21 x10E6/uL (ref 3.77–5.28)
RDW: 12.8 % (ref 11.7–15.4)
WBC: 7 10*3/uL (ref 3.4–10.8)

## 2020-01-27 LAB — LIPID PANEL
Chol/HDL Ratio: 3.9 ratio (ref 0.0–4.4)
Cholesterol, Total: 154 mg/dL (ref 100–199)
HDL: 40 mg/dL (ref >39)
LDL Chol Calc (NIH): 75 mg/dL (ref 0–99)
Triglycerides: 240 mg/dL — ABNORMAL HIGH (ref 0–149)
VLDL Cholesterol Cal: 39 mg/dL (ref 5–40)

## 2020-01-27 LAB — TSH: TSH: 2.48 u[IU]/mL (ref 0.450–4.500)

## 2020-01-30 ENCOUNTER — Telehealth: Payer: Self-pay | Admitting: Family Medicine

## 2020-01-30 NOTE — Telephone Encounter (Signed)
Pt aware of lab results and verbalize understanding.

## 2020-02-13 ENCOUNTER — Other Ambulatory Visit (HOSPITAL_COMMUNITY): Payer: Self-pay | Admitting: Psychiatry

## 2020-02-13 DIAGNOSIS — F3160 Bipolar disorder, current episode mixed, unspecified: Secondary | ICD-10-CM

## 2020-02-23 ENCOUNTER — Encounter (HOSPITAL_COMMUNITY): Payer: Self-pay | Admitting: Physical Therapy

## 2020-02-23 NOTE — Therapy (Signed)
[facility] [address] [place], [place], [address] Phone: [phone]   Fax:  [phone]  Patient Details  Name: Allison Sharp MRN: [id number] Date of Birth: Jul 10, 1955 Referring Provider:  No ref. provider found  Encounter Date: 02/23/2020   PHYSICAL THERAPY DISCHARGE SUMMARY  Visits from Start of Care: 18  Current functional level related to goals / functional outcomes: PT continues to have significant edema in LE    Remaining deficits: Edema    Education / Equipment: EX, use of pump and garments.  Plan: Patient agrees to discharge.  Patient goals were partially met. Patient is being discharged due to not returning since the last visit.  ?????      [name], PT CLT [phone] 02/23/2020, 4:42 PM  [facility] [address] [place], [place], [address] Phone: [phone]   Fax:  [phone]

## 2020-03-12 ENCOUNTER — Other Ambulatory Visit (HOSPITAL_COMMUNITY): Payer: Self-pay | Admitting: Psychiatry

## 2020-03-12 DIAGNOSIS — F3160 Bipolar disorder, current episode mixed, unspecified: Secondary | ICD-10-CM

## 2020-03-15 ENCOUNTER — Other Ambulatory Visit: Payer: Self-pay

## 2020-03-15 ENCOUNTER — Telehealth (INDEPENDENT_AMBULATORY_CARE_PROVIDER_SITE_OTHER): Payer: Medicare Other | Admitting: Psychiatry

## 2020-03-15 ENCOUNTER — Encounter (HOSPITAL_COMMUNITY): Payer: Self-pay | Admitting: Psychiatry

## 2020-03-15 ENCOUNTER — Telehealth (HOSPITAL_COMMUNITY): Payer: Medicare Other | Admitting: Psychiatry

## 2020-03-15 VITALS — Wt 360.0 lb

## 2020-03-15 DIAGNOSIS — F3160 Bipolar disorder, current episode mixed, unspecified: Secondary | ICD-10-CM | POA: Diagnosis not present

## 2020-03-15 DIAGNOSIS — F411 Generalized anxiety disorder: Secondary | ICD-10-CM | POA: Diagnosis not present

## 2020-03-15 DIAGNOSIS — G3184 Mild cognitive impairment, so stated: Secondary | ICD-10-CM | POA: Diagnosis not present

## 2020-03-15 MED ORDER — DULOXETINE HCL 60 MG PO CPEP: 60.0000 mg | ORAL_CAPSULE | Freq: Every morning | ORAL | 1 refills | Status: DC

## 2020-03-15 MED ORDER — TRAZODONE HCL 100 MG PO TABS: 100.0000 mg | ORAL_TABLET | Freq: Every day | ORAL | 1 refills | Status: DC

## 2020-03-15 MED ORDER — DIVALPROEX SODIUM 500 MG PO DR TAB: 500.0000 mg | DELAYED_RELEASE_TABLET | Freq: Two times a day (BID) | ORAL | 1 refills | Status: DC

## 2020-03-15 MED ORDER — BENZTROPINE MESYLATE 1 MG PO TABS: 1.0000 mg | ORAL_TABLET | Freq: Every day | ORAL | 1 refills | Status: DC

## 2020-03-15 NOTE — Progress Notes (Signed)
Virtual Visit via Telephone Note  I connected with Allison Sharp on 03/15/20 at 11:40 AM EDT by telephone and verified that I am speaking with the correct person using two identifiers.  Location: Patient: Home Provider: Home office   I discussed the limitations, risks, security and privacy concerns of performing an evaluation and management service by telephone and the availability of in person appointments. I also discussed with the patient that there may be a patient responsible charge related to this service. The patient expressed understanding and agreed to proceed.   History of Present Illness: Patient is evaluated by phone session.  On the last visit we cut down her Risperdal 1 mg in the morning however she stayed 4 mg at bedtime.  She was complaining of feeling tired and sometimes she is sleeping during the day.  She noticed that helped her motivation, energy level but she feels some time depressed.  She lost another 10 pounds in 6 weeks.  She feels her Sister [name] is helping her a lot and she is more motivated.  She is still struggle with cognition but able to do her ADLs without any issues.  Her niece is not involved as much in her daily life.  She is taking multiple medication and recently she had blood work.  Her cholesterol is improved.  Her thyroid is normal.  We do not have Depakote level.  However she is going next month for her PCP appointment.  I recommend that she can have Depakote level there.  Patient reported no major concerns from the medication.  She denies any paranoia, hallucination or any suicidal thoughts.  Past Psychiatric History:Reviewed. H/Opsychiatric illness with multiple inpatient treatment. [facility] admissions inMay 2020 at [facility].H/Osuicidal attempt by taking overdose. Diagnosed with PTSD, depression, bipolar disorder. Tried Paxil, Prozac, Wellbutrin, Effexor, Lexapro, amitriptyline, Cymbalta, Abilify (Tremors), Neurontin, trazodone, Thorazine,  hydroxyzine, Luvox, Sinequan, Zyprexa and Latuda. Best responded on Risperdal and Cymbalta.   Recent Results (from the past 2160 hour(s))  CBC with Differential/Platelet     Status: None   Collection Time: 01/26/20  4:00 PM  Result Value Ref Range   WBC 7.0 3.4 - 10.8 x10E3/uL   RBC 4.21 3.77 - 5.28 x10E6/uL   Hemoglobin 13.3 11.1 - 15.9 g/dL   Hematocrit 38.2 34.0 - 46.6 %   MCV 91 79 - 97 fL   MCH 31.6 26.6 - 33.0 pg   MCHC 34.8 31 - 35 g/dL   RDW 12.8 11.7 - 15.4 %   Platelets 154 150 - 450 x10E3/uL   Neutrophils 56 Not Estab. %   Lymphs 33 Not Estab. %   Monocytes 7 Not Estab. %   Eos 3 Not Estab. %   Basos 1 Not Estab. %   Neutrophils Absolute 3.9 1 - 7 x10E3/uL   Lymphocytes Absolute 2.3 0 - 3 x10E3/uL   Monocytes Absolute 0.5 0 - 0 x10E3/uL   EOS (ABSOLUTE) 0.2 0.0 - 0.4 x10E3/uL   Basophils Absolute 0.1 0 - 0 x10E3/uL   Immature Granulocytes 0 Not Estab. %   Immature Grans (Abs) 0.0 0.0 - 0.1 x10E3/uL  CMP14+EGFR     Status: Abnormal   Collection Time: 01/26/20  4:00 PM  Result Value Ref Range   Glucose 104 (H) 65 - 99 mg/dL   BUN 13 8 - 27 mg/dL   Creatinine, Ser 0.86 0.57 - 1.00 mg/dL   GFR calc non Af Amer 71 >59 mL/min/1.73   GFR calc Af Amer 82 >59 mL/min/1.73  Comment: **[facility] currently reports eGFR in compliance with the current**   recommendations of the [employer]. [facility] will   update reporting as new guidelines are published from the NKF-ASN   Task force.    BUN/Creatinine Ratio 15 12 - 28   Sodium 141 134 - 144 mmol/L   Potassium 4.1 3.5 - 5.2 mmol/L   Chloride 102 96 - 106 mmol/L   CO2 27 20 - 29 mmol/L   Calcium 9.5 8.7 - 10.3 mg/dL   Total Protein 6.5 6.0 - 8.5 g/dL   Albumin 4.3 3.8 - 4.8 g/dL   Globulin, Total 2.2 1.5 - 4.5 g/dL   Albumin/Globulin Ratio 2.0 1.2 - 2.2   Bilirubin Total 0.3 0.0 - 1.2 mg/dL   Alkaline Phosphatase 82 48 - 121 IU/L   AST 15 0 - 40 IU/L   ALT 10 0 - 32 IU/L  Lipid panel     Status:  Abnormal   Collection Time: 01/26/20  4:00 PM  Result Value Ref Range   Cholesterol, Total 154 100 - 199 mg/dL   Triglycerides 240 (H) 0 - 149 mg/dL   HDL 40 >39 mg/dL   VLDL Cholesterol Cal 39 5 - 40 mg/dL   LDL Chol Calc (NIH) 75 0 - 99 mg/dL   Chol/HDL Ratio 3.9 0.0 - 4.4 ratio    Comment:                                   T. Chol/HDL Ratio                                             Men  Women                               1/2 Avg.Risk  3.4    3.3                                   Avg.Risk  5.0    4.4                                2X Avg.Risk  9.6    7.1                                3X Avg.Risk 23.4   11.0   TSH     Status: None   Collection Time: 01/26/20  4:00 PM  Result Value Ref Range   TSH 2.480 0.450 - 4.500 uIU/mL    Psychiatric Specialty Exam: Physical Exam  Review of Systems  Weight (!) 360 lb (163.3 kg), last menstrual period 02/18/1995.There is no height or weight on file to calculate BMI.  General Appearance: NA  Eye Contact:  NA  Speech:  Slow  Volume:  Normal  Mood:  Euthymic  Affect:  NA  Thought Process:  Descriptions of Associations: Intact  Orientation:  Full (Time, Place, and Person)  Thought Content:  Rumination  Suicidal Thoughts:  No  Homicidal Thoughts:  No  Memory:  Immediate;   Fair Recent;  Fair Remote;   Fair  Judgement:  Intact  Insight:  Shallow  Psychomotor Activity:  NA  Concentration:  Concentration: Fair and Attention Span: Fair  Recall:  [employer] of Knowledge:  Fair  Language:  Good  Akathisia:  No  Handed:  Right  AIMS (if indicated):     Assets:  Communication Skills Desire for Improvement Housing Resilience Social Support  ADL's:  Intact  Cognition:  Impaired,  Mild  Sleep:   ok      Assessment and Plan: Bipolar disorder type I.  Generalized anxiety disorder.  Mild cognitive impairment.  Discussed polypharmacy.  Discussed recent blood work results.  Recommended to stay on Risperdal 4 mg at bedtime only  since she is taking other medication.  She had lost weight.  I recommend cutting down the medication may help her memory and she agreed.  Reminded to have a Depakote level on her next appointment with PCP.  Continue trazodone 100 mg at bedtime, Depakote 500 mg twice a day, Cymbalta 60 mg daily, Cogentin 1 mg at bedtime and Risperdal 4 mg at bedtime.  Recommended to call us back if she has any question or any concern.  Follow-up in 2 months.  Follow Up Instructions:    I discussed the assessment and treatment plan with the patient. The patient was provided an opportunity to ask questions and all were answered. The patient agreed with the plan and demonstrated an understanding of the instructions.   The patient was advised to call back or seek an in-person evaluation if the symptoms worsen or if the condition fails to improve as anticipated.  I provided 17 minutes of non-face-to-face time during this encounter.   Kathlee Nations, MD

## 2020-04-07 DIAGNOSIS — J449 Chronic obstructive pulmonary disease, unspecified: Secondary | ICD-10-CM | POA: Diagnosis not present

## 2020-04-07 DIAGNOSIS — G4733 Obstructive sleep apnea (adult) (pediatric): Secondary | ICD-10-CM | POA: Diagnosis not present

## 2020-04-26 ENCOUNTER — Encounter: Payer: Self-pay | Admitting: Family Medicine

## 2020-04-26 ENCOUNTER — Ambulatory Visit (INDEPENDENT_AMBULATORY_CARE_PROVIDER_SITE_OTHER): Payer: Medicare Other | Admitting: Family Medicine

## 2020-04-26 DIAGNOSIS — E039 Hypothyroidism, unspecified: Secondary | ICD-10-CM | POA: Diagnosis not present

## 2020-04-26 DIAGNOSIS — I10 Essential (primary) hypertension: Secondary | ICD-10-CM

## 2020-04-26 DIAGNOSIS — K219 Gastro-esophageal reflux disease without esophagitis: Secondary | ICD-10-CM

## 2020-04-26 DIAGNOSIS — E785 Hyperlipidemia, unspecified: Secondary | ICD-10-CM

## 2020-04-26 MED ORDER — NAPROXEN 500 MG PO TBEC: 500.0000 mg | DELAYED_RELEASE_TABLET | Freq: Two times a day (BID) | ORAL | 11 refills | Status: DC

## 2020-04-26 NOTE — Progress Notes (Signed)
Virtual Visit via telephone Note  I connected with Allison Sharp on 04/26/20 at [place] by telephone and verified that I am speaking with the correct person using two identifiers. Allison Sharp is currently located at home and no other people are currently with her during visit. The provider, Fransisca Kaufmann Welborn Keena, MD is located in their office at time of visit.  Call ended at 1514  I discussed the limitations, risks, security and privacy concerns of performing an evaluation and management service by telephone and the availability of in person appointments. I also discussed with the patient that there may be a patient responsible charge related to this service. The patient expressed understanding and agreed to proceed.   History and Present Illness: Pain and arthritis in hands and wants refill of naproxen  Hypertension Patient is currently on lisinopril, and their blood pressure today is unknown. Patient denies any lightheadedness or dizziness. Patient denies headaches, blurred vision, chest pains, shortness of breath, or weakness. Denies any side effects from medication and is content with current medication.   Hypothyroidism recheck Patient is coming in for thyroid recheck today as well. They deny any issues with hair changes or heat or cold problems or diarrhea or constipation. They deny any chest pain or palpitations. They are currently on levothyroxine 115micrograms   GERD Patient is currently on omeprazole.  She denies any major symptoms or abdominal pain or belching or burping. She denies any blood in her stool or lightheadedness or dizziness.   1. Acquired hypothyroidism   2. Gastroesophageal reflux disease without esophagitis   3. Essential hypertension, benign   4. Hyperlipidemia LDL goal <130     Outpatient Encounter Medications as of 04/26/2020  Medication Sig  . albuterol (PROAIR HFA) 108 (90 Base) MCG/ACT inhaler USE 2 PUFF EVERY 4 HOURS AS NEEDED FOR WHEEZING OR  SHORTNESS OF BREATH  . albuterol (PROVENTIL) (2.5 MG/3ML) 0.083% nebulizer solution Take 3 mLs (2.5 mg total) by nebulization every 4 (four) hours as needed for wheezing or shortness of breath.  Marland Kitchen atorvastatin (LIPITOR) 20 MG tablet Take 1 tablet (20 mg total) by mouth daily.  . benztropine (COGENTIN) 1 MG tablet Take 1 tablet (1 mg total) by mouth at bedtime.  . budesonide-formoterol (SYMBICORT) 80-4.5 MCG/ACT inhaler Inhale 2 puffs into the lungs in the morning and at bedtime.  . celecoxib (CELEBREX) 200 MG capsule Take 1 capsule (200 mg total) by mouth 2 (two) times daily.  . cetirizine (ZYRTEC) 10 MG tablet TAKE 1 TABLET ONCE DAILY  . cloNIDine (CATAPRES) 0.1 MG tablet Take 1 tablet (0.1 mg total) by mouth at bedtime.  . diclofenac sodium (VOLTAREN) 1 % GEL Apply to affected area twice daily as needed (Patient taking differently: Apply 2 g topically 2 (two) times daily as needed (PAIN). )  . divalproex (DEPAKOTE) 500 MG DR tablet Take 1 tablet (500 mg total) by mouth 2 (two) times daily.  . DULoxetine (CYMBALTA) 60 MG capsule Take 1 capsule (60 mg total) by mouth in the morning.  . fluticasone (FLONASE) 50 MCG/ACT nasal spray Place 1 spray 2 (two) times daily as needed into both nostrils for allergies or rhinitis.  . fluticasone furoate-vilanterol (BREO ELLIPTA) 100-25 MCG/INH AEPB Inhale 1 puff into the lungs daily.  Marland Kitchen levothyroxine (SYNTHROID) 125 MCG tablet Take 1 tablet (125 mcg total) by mouth daily.  Marland Kitchen lisinopril (ZESTRIL) 5 MG tablet Take 1 tablet (5 mg total) by mouth daily.  . naproxen (EC NAPROSYN) 500 MG EC tablet  Take 1 tablet (500 mg total) by mouth 2 (two) times daily with a meal.  . nystatin cream (MYCOSTATIN) APPLY TO THE AFFECTED AREA THREE TIMES DAILY AS NEEDED .Marland KitchenFOR TOPICAL USE ONLY... (Patient taking differently: Apply 1 application topically 3 (three) times daily as needed for dry skin. )  . omeprazole (PRILOSEC) 20 MG capsule Take 1 capsule (20 mg total) by mouth daily.  .  ondansetron (ZOFRAN-ODT) 4 MG disintegrating tablet DISSOLVE 1 TABLET IN MOUTH EVERY 8 HOURS AS NEEDED FOR NAUSEA AND VOMITING  . OXYGEN Inhale 2 L into the lungs daily.  . polyethylene glycol powder (GLYCOLAX/MIRALAX) 17 GM/SCOOP powder DISSOLVE 17 GM SCOOP IN 8 OZ. OF WATER AND DRINK ONCE DAILY AS NEEDED FOR CONSTIPATION  . promethazine (PHENERGAN) 25 MG tablet Take 1 tablet (25 mg total) by mouth every 8 (eight) hours as needed for nausea or vomiting.  . risperidone (RISPERDAL) 4 MG tablet Take 1 tablet (4 mg total) by mouth at bedtime.  Marland Kitchen therapeutic multivitamin-minerals (THERAGRAN-M) tablet Take 1 tablet by mouth daily.  . traMADol (ULTRAM) 50 MG tablet Take 1 tablet (50 mg total) by mouth every 8 (eight) hours as needed for moderate pain.  . traZODone (DESYREL) 100 MG tablet Take 1 tablet (100 mg total) by mouth at bedtime.  . Vitamin D, Ergocalciferol, (DRISDOL) 1.25 MG (50000 UT) CAPS capsule TAKE 1 CAPSULE EVERY 7 SEVEN DAYS   No facility-administered encounter medications on file as of 04/26/2020.    Review of Systems  Constitutional: Negative for chills and fever.  Eyes: Negative for visual disturbance.  Respiratory: Negative for chest tightness and shortness of breath.   Cardiovascular: Negative for chest pain and leg swelling.  Musculoskeletal: Negative for back pain and gait problem.  Skin: Negative for rash.  Neurological: Negative for light-headedness and headaches.  Psychiatric/Behavioral: Negative for agitation and behavioral problems.  All other systems reviewed and are negative.   Observations/Objective: Patient sounds comfortable and in no acute distress  Assessment and Plan: Problem List Items Addressed This Visit      Cardiovascular and Mediastinum   Essential hypertension, benign (Chronic)     Digestive   GERD (Chronic)     Endocrine   Hypothyroidism - Primary (Chronic)     Other   Hyperlipidemia LDL goal <130     continue current medications and no  changes  Follow up plan: Return in about 3 months (around 07/26/2020), or if symptoms worsen or fail to improve, for htn and thyroid.     I discussed the assessment and treatment plan with the patient. The patient was provided an opportunity to ask questions and all were answered. The patient agreed with the plan and demonstrated an understanding of the instructions.   The patient was advised to call back or seek an in-person evaluation if the symptoms worsen or if the condition fails to improve as anticipated.  The above assessment and management plan was discussed with the patient. The patient verbalized understanding of and has agreed to the management plan. Patient is aware to call the clinic if symptoms persist or worsen. Patient is aware when to return to the clinic for a follow-up visit. Patient educated on when it is appropriate to go to the emergency department.    I provided 11 minutes of non-face-to-face time during this encounter.    Worthy Rancher, MD

## 2020-05-10 ENCOUNTER — Other Ambulatory Visit (HOSPITAL_COMMUNITY): Payer: Self-pay | Admitting: Psychiatry

## 2020-05-10 ENCOUNTER — Other Ambulatory Visit: Payer: Self-pay | Admitting: Family Medicine

## 2020-05-10 DIAGNOSIS — F3160 Bipolar disorder, current episode mixed, unspecified: Secondary | ICD-10-CM

## 2020-05-12 ENCOUNTER — Other Ambulatory Visit: Payer: Self-pay | Admitting: Family Medicine

## 2020-05-12 DIAGNOSIS — Z1231 Encounter for screening mammogram for malignant neoplasm of breast: Secondary | ICD-10-CM

## 2020-05-20 ENCOUNTER — Other Ambulatory Visit (HOSPITAL_COMMUNITY): Payer: Self-pay | Admitting: *Deleted

## 2020-05-20 DIAGNOSIS — F3160 Bipolar disorder, current episode mixed, unspecified: Secondary | ICD-10-CM

## 2020-05-20 MED ORDER — DIVALPROEX SODIUM 500 MG PO DR TAB: 500.0000 mg | DELAYED_RELEASE_TABLET | Freq: Two times a day (BID) | ORAL | 0 refills | Status: DC

## 2020-05-25 ENCOUNTER — Ambulatory Visit (INDEPENDENT_AMBULATORY_CARE_PROVIDER_SITE_OTHER): Payer: Medicare Other | Admitting: Family Medicine

## 2020-05-25 ENCOUNTER — Other Ambulatory Visit: Payer: Self-pay

## 2020-05-25 ENCOUNTER — Encounter: Payer: Self-pay | Admitting: Family Medicine

## 2020-05-25 VITALS — BP 116/77 | HR 92 | Temp 97.3°F | Ht 69.0 in | Wt 363.2 lb

## 2020-05-25 DIAGNOSIS — R399 Unspecified symptoms and signs involving the genitourinary system: Secondary | ICD-10-CM | POA: Diagnosis not present

## 2020-05-25 LAB — MICROSCOPIC EXAMINATION: RBC, Urine: NONE SEEN /hpf (ref 0–2)

## 2020-05-25 LAB — URINALYSIS, COMPLETE
Bilirubin, UA: NEGATIVE
Glucose, UA: NEGATIVE
Leukocytes,UA: NEGATIVE
Nitrite, UA: NEGATIVE
Protein,UA: NEGATIVE
RBC, UA: NEGATIVE
Specific Gravity, UA: 1.02 (ref 1.005–1.030)
Urobilinogen, Ur: 4 mg/dL — ABNORMAL HIGH (ref 0.2–1.0)
pH, UA: 8.5 — ABNORMAL HIGH (ref 5.0–7.5)

## 2020-05-25 MED ORDER — AMOXICILLIN 500 MG PO CAPS: 500.0000 mg | ORAL_CAPSULE | Freq: Three times a day (TID) | ORAL | 0 refills | Status: DC

## 2020-05-25 NOTE — Progress Notes (Signed)
Chief Complaint  Patient presents with  . Dysuria    HPI  Patient presents today for burning with urination and frequency for several days. Denies fever . No flank pain. No nausea, vomiting.   PMH: Smoking status noted ROS: Per HPI  Objective: BP 116/77   Pulse 92   Temp (!) 97.3 F (36.3 C) (Temporal)   Ht 5\' 9"  (1.753 m)   Wt (!) 363 lb 4 oz (164.8 kg)   LMP 02/18/1995   BMI 53.64 kg/m  Gen: NAD, alert, cooperative with exam HEENT: NCAT, EOMI, PERRL CV: RRR, good S1/S2, no murmur Resp: CTABL, no wheezes, non-labored Abd: SNTND, BS present, no guarding or organomegaly Ext: No edema, warm Neuro: Alert and oriented, No gross deficits  Assessment and plan:  1. UTI symptoms     Meds ordered this encounter  Medications  . amoxicillin (AMOXIL) 500 MG capsule    Sig: Take 1 capsule (500 mg total) by mouth 3 (three) times daily.    Dispense:  21 capsule    Refill:  0    Orders Placed This Encounter  Procedures  . Urine Culture  . Urinalysis, Complete    Follow up as needed.  02/20/1995, MD

## 2020-05-27 LAB — URINE CULTURE

## 2020-06-04 ENCOUNTER — Other Ambulatory Visit (HOSPITAL_COMMUNITY): Payer: Self-pay | Admitting: Psychiatry

## 2020-06-04 DIAGNOSIS — F3160 Bipolar disorder, current episode mixed, unspecified: Secondary | ICD-10-CM

## 2020-06-11 ENCOUNTER — Ambulatory Visit (INDEPENDENT_AMBULATORY_CARE_PROVIDER_SITE_OTHER): Payer: Medicare Other | Admitting: *Deleted

## 2020-06-11 VITALS — BP 116/77 | Ht 69.0 in | Wt 363.0 lb

## 2020-06-11 DIAGNOSIS — Z0000 Encounter for general adult medical examination without abnormal findings: Secondary | ICD-10-CM | POA: Diagnosis not present

## 2020-06-11 NOTE — Progress Notes (Signed)
MEDICARE ANNUAL WELLNESS VISIT  06/11/2020  Telephone Visit Disclaimer This Medicare AWV was conducted by telephone due to national recommendations for restrictions regarding the COVID-19 Pandemic (e.g. social distancing).  I verified, using two identifiers, that I am speaking with Allison Sharp or their authorized healthcare agent. I discussed the limitations, risks, security, and privacy concerns of performing an evaluation and management service by telephone and the potential availability of an in-person appointment in the future. The patient expressed understanding and agreed to proceed.  Location of Patient: in her home Location of Provider (nurse):  In office  Subjective:    Allison Sharp is a 65 y.o. female patient of Dettinger, Fransisca Kaufmann, MD who had a Medicare Annual Wellness Visit today via telephone. Allison Sharp is Disabled and lives with their family. she has 2 children. she reports that she is socially active and does interact with friends/family regularly. she is minimally physically active and enjoys watching TV.  Patient Care Team: Dettinger, Fransisca Kaufmann, MD as PCP - General (Family Medicine) Fay Records, MD as PCP - Cardiology (Cardiology) Adele Schilder, Arlyce Harman, MD (Psychiatry) Danie Binder, MD (Inactive) (Gastroenterology) Fay Records, MD as Consulting Physician (Cardiology) Michel Bickers, MD as Consulting Physician (Infectious Diseases) Harlen Labs, MD as Referring Physician (Optometry) Marcial Pacas, MD as Consulting Physician (Neurology)  Advanced Directives 06/11/2020 04/11/2019 10/14/2018 09/30/2018 08/06/2018 08/06/2018 01/25/2018  Does Patient Have a Medical Advance Directive? No No No No No No No  Type of Advance Directive - - - - - - -  Does patient want to make changes to medical advance directive? - - - - - - -  Copy of [place] in Chart? - - - - - - -  Would patient like information on creating a medical advance directive? No - Patient  declined No - Patient declined - - No - Patient declined Yes (ED - Information included in AVS) No - Patient declined  Pre-existing out of facility DNR order (yellow form or pink MOST form) - - - - - - -  Some encounter information is confidential and restricted. Go to Review Flowsheets activity to see all data.    Hospital Utilization Over the Past 12 Months: # of hospitalizations or ER visits: 0 # of surgeries: 0  Review of Systems    Patient reports that her overall health is better compared to last year.  General ROS: neg.  Patient Reported Readings (BP, Pulse, CBG, Weight, etc) BP 116/77   Ht 5\' 9"  (1.753 m)   Wt (!) 363 lb (164.7 kg)   LMP 02/18/1995   BMI 53.61 kg/m    Pain Assessment       Current Medications & Allergies (verified) Allergies as of 06/11/2020      Reactions   Abilify [aripiprazole] Other (See Comments)   hallucinations   Naproxen Nausea And Vomiting, Swelling   Ativan [lorazepam] Other (See Comments)   Delirium   Haldol [haloperidol] Other (See Comments)   Hallucinating    Hydroxyzine Other (See Comments)   hallucinations      Medication List       Accurate as of June 11, 2020 11:42 AM. If you have any questions, ask your nurse or doctor.        STOP taking these medications   amoxicillin 500 MG capsule Commonly known as: AMOXIL   Breo Ellipta 100-25 MCG/INH Aepb Generic drug: fluticasone furoate-vilanterol   polyethylene glycol powder 17 GM/SCOOP powder  Commonly known as: GLYCOLAX/MIRALAX     TAKE these medications   albuterol 108 (90 Base) MCG/ACT inhaler Commonly known as: ProAir HFA USE 2 PUFF EVERY 4 HOURS AS NEEDED FOR WHEEZING OR SHORTNESS OF BREATH What changed: Another medication with the same name was removed. Continue taking this medication, and follow the directions you see here.   atorvastatin 20 MG tablet Commonly known as: LIPITOR Take 1 tablet (20 mg total) by mouth daily.   benztropine 1 MG  tablet Commonly known as: COGENTIN Take 1 tablet (1 mg total) by mouth at bedtime.   budesonide-formoterol 80-4.5 MCG/ACT inhaler Commonly known as: SYMBICORT Inhale 2 puffs into the lungs in the morning and at bedtime.   celecoxib 200 MG capsule Commonly known as: CELEBREX Take 1 capsule (200 mg total) by mouth 2 (two) times daily.   cetirizine 10 MG tablet Commonly known as: ZYRTEC TAKE 1 TABLET ONCE DAILY   cloNIDine 0.1 MG tablet Commonly known as: CATAPRES Take 1 tablet (0.1 mg total) by mouth at bedtime.   diclofenac sodium 1 % Gel Commonly known as: Voltaren Apply to affected area twice daily as needed What changed:   how much to take  how to take this  when to take this  reasons to take this  additional instructions   divalproex 500 MG DR tablet Commonly known as: DEPAKOTE Take 1 tablet (500 mg total) by mouth 2 (two) times daily.   DULoxetine 60 MG capsule Commonly known as: CYMBALTA Take 1 capsule (60 mg total) by mouth in the morning.   fluticasone 50 MCG/ACT nasal spray Commonly known as: FLONASE Place 1 spray 2 (two) times daily as needed into both nostrils for allergies or rhinitis.   levothyroxine 125 MCG tablet Commonly known as: SYNTHROID Take 1 tablet (125 mcg total) by mouth daily.   lisinopril 5 MG tablet Commonly known as: ZESTRIL Take 1 tablet (5 mg total) by mouth daily.   naproxen 500 MG EC tablet Commonly known as: EC NAPROSYN Take 1 tablet (500 mg total) by mouth 2 (two) times daily with a meal.   nystatin cream Commonly known as: MYCOSTATIN APPLY TO THE AFFECTED AREA THREE TIMES DAILY AS NEEDED .Marland KitchenFOR TOPICAL USE ONLY... What changed: See the new instructions.   omeprazole 20 MG capsule Commonly known as: PRILOSEC Take 1 capsule (20 mg total) by mouth daily.   ondansetron 4 MG disintegrating tablet Commonly known as: ZOFRAN-ODT DISSOLVE 1 TABLET IN MOUTH EVERY 8 HOURS AS NEEDED FOR NAUSEA AND VOMITING   OXYGEN Inhale 2  L into the lungs daily.   promethazine 25 MG tablet Commonly known as: PHENERGAN Take 1 tablet (25 mg total) by mouth every 8 (eight) hours as needed for nausea or vomiting.   risperidone 4 MG tablet Commonly known as: RISPERDAL Take 1 tablet (4 mg total) by mouth at bedtime.   therapeutic multivitamin-minerals tablet Take 1 tablet by mouth daily.   traMADol 50 MG tablet Commonly known as: ULTRAM Take 1 tablet (50 mg total) by mouth every 8 (eight) hours as needed for moderate pain.   traZODone 100 MG tablet Commonly known as: DESYREL Take 1 tablet (100 mg total) by mouth at bedtime.   Vitamin D (Ergocalciferol) 1.25 MG (50000 UNIT) Caps capsule Commonly known as: DRISDOL TAKE 1 CAPSULE EVERY 7 SEVEN DAYS       History (reviewed): Past Medical History:  Diagnosis Date  . Allergy   . Anxiety   . Arthritis   . Asthma   .  Bipolar 1 disorder ([place])   . CAD (coronary artery disease)   . Cellulitis   . CHF (congestive heart failure) ([facility])    diastolic dysfunction  . Chronic back pain   . Chronic headaches   . Complication of anesthesia    States she typically gets sick s/p anesthesia  . Contusion of sacrum   . COPD (chronic obstructive pulmonary disease) ([place])   . Dementia ([place])   . Depression   . Dyspnea    PFT 03/05/09 FEV1 2.77(98%), FVC 3.25(86%), FEV1% 85, TLC 5.88(99%), DLCO 60% ,  Methacholine challenge 03/16/09 normal ,  CT chest 03/12/09 no pulmonary disease  . Fungal infection   . GERD (gastroesophageal reflux disease)   . History of colonoscopy 10/17/2002   by Dr Rehman-> distal non-specific proctitis, small ext hemorrhoids,   . HTN (hypertension)   . Hyperlipidemia   . Hyperthyroidism    radioactive - thyroid cancer   . Migraine headache   . Morbid obesity with body mass index of 50.0-59.9 in adult [facility]) JAN 2011 370 LBS   2004 311 BMI 45.9  . Myocardial infarction ([place])    NOV 1997  . OSA on CPAP    she had been on 2L O2 at night but that was  stopped  . PONV (postoperative nausea and vomiting)   . PTSD (post-traumatic stress disorder)   . PTSD (post-traumatic stress disorder)   . Suicidal ideation   . Urine incontinence   . Vitamin D deficiency    Past Surgical History:  Procedure Laterality Date  . ABDOMINAL HYSTERECTOMY     sept 1996  . APPENDECTOMY    . BACK SURGERY  2008  . CARDIAC CATHETERIZATION     nov 1997  . CHOLECYSTECTOMY    . COLONOSCOPY  10/17/2002    Distal proctitis, small external hemorrhoids, otherwise/  normal colonoscopy. Suspect rectal bleeding secondary to hemorrhoids  . ESOPHAGOGASTRODUODENOSCOPY  03/18/09   fundic gland polyps/mild gastritis  . HERNIA REPAIR  1978  . JOINT REPLACEMENT     bil knee replacement  . KNEE ARTHROSCOPY    . LEFT HEART CATH AND CORONARY ANGIOGRAPHY N/A 08/08/2018   Procedure: LEFT HEART CATH AND CORONARY ANGIOGRAPHY;  Surgeon: Jettie Booze, MD;  Location: [place] [facility];  Service: Cardiovascular;  Laterality: N/A;  . MULTIPLE EXTRACTIONS WITH ALVEOLOPLASTY N/A 08/16/2015   Procedure: EXTRACTION OF TEETH THREE, SIX, EIGHT, NINE, ELEVEN, FOURTEEN, FIFTEEN, TWENTY-EIGHT WITH ALVEOLOPLASTY;  Surgeon: Diona Browner, DDS;  Location: [place];  Service: Oral Surgery;  Laterality: N/A;  . ROTATOR CUFF REPAIR Right   . TONSILLECTOMY    . TOTAL VAGINAL HYSTERECTOMY    . TUBAL LIGATION     Family History  Adopted: Yes  Problem Relation Age of Onset  . Hypertension Mother   . Bipolar disorder Mother   . Dementia Mother   . Depression Mother   . AAA (abdominal aortic aneurysm) Mother   . Coronary artery disease Father   . Alcohol abuse Father   . Hypertension Brother   . Coronary artery disease Brother   . Bipolar disorder Brother   . Depression Brother   . Depression Sister   . Paranoid behavior Sister   . Cancer Sister        breast  . Bipolar disorder Sister   . Depression Sister   . Hypertension Sister   . Arthritis Sister        knee and hand   .  Irregular heart beat Sister  takes eliquis   . Cancer Son        thyroid  . Drug abuse Son   . Alcohol abuse Son   . Cancer Maternal Grandfather        throat   . Cancer Maternal Aunt        breast metastatized to brain  . Anesthesia problems Neg Hx   . Hypotension Neg Hx   . Malignant hyperthermia Neg Hx   . Pseudochol deficiency Neg Hx    Social History   Socioeconomic History  . Marital status: Single    Spouse name: Not on file  . Number of children: 2  . Years of education: Not on file  . Highest education level: Not on file  Occupational History  . 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 Smoking status: Never Smoker  . Smokeless tobacco: Never Used  Vaping Use  . Vaping Use: Never used  Substance and Sexual Activity  . Alcohol use: No    Alcohol/week: 0.0 standard drinks  . Drug use: No  . Sexual activity: Never    Birth control/protection: Abstinence  Other Topics Concern  . Not on file  Social History Narrative   Ms.Guhl  is disabled and lives with her sister.    She has two grown sons that she does not see often, as they live in other states. She has 5 grand children.   She has a long history of mental illness including depression, PTSD, suicidal and homicidal ideation.   She has been obese most all of her life. Her weight has significantly impacted her QOL. She recently lost 20 lbs. By decreasing portion size & increasing proteins.    Social Determinants of Health   Financial Resource Strain: Not on file  Food Insecurity: Not on file  Transportation Needs: Not on file  Physical Activity: Not on file  Stress: Not on file  Social Connections: Not on file    Activities of Daily Living In your present state of health, do you have any difficulty performing the following activities: 06/11/2020  Hearing? N  Vision? Y  Comment rx glasses  Difficulty concentrating or making decisions? Y  Comment  sometimes  Walking or climbing stairs? N  Dressing or bathing? N  Doing errands, shopping? Y  Preparing Food and eating ? Y  Comment sister cook  Using the Toilet? N  In the past six months, have you accidently leaked urine? N  Do you have problems with loss of bowel control? N  Managing your Medications? Y  Comment pharmacy does it  Managing your Finances? N  Housekeeping or managing your Housekeeping? N  Some recent data might be hidden    Patient Education/ Literacy    Exercise Current Exercise Habits: Home exercise routine, Type of exercise: walking;strength training/weights, Time (Minutes): 60, Frequency (Times/Week): 1, Weekly Exercise (Minutes/Week): 60, Intensity: Mild, Exercise limited by: None identified  Diet Patient reports consuming 1 meals a day and 1 snack(s) a day Patient reports that her primary diet is: Regular Patient reports that she does have regular access to food.   Depression Screen PHQ 2/9 Scores 06/11/2020 01/26/2020 10/08/2019 05/12/2019 02/07/2019 10/25/2018 09/30/2018  PHQ - 2 Score 1 1 4  - 4 1 0  PHQ- 9 Score - - 12 - 15 - -  Exception Documentation - - - Patient refusal - - -     Fall Risk Fall Risk  06/11/2020 01/26/2020 10/08/2019  05/12/2019 02/07/2019  Falls in the past year? 1 0 1 0 1  Number falls in past yr: 0 - 1 - 0  Injury with Fall? 1 - 0 - 0  Risk Factor Category  - - - - -  Risk for fall due to : Impaired balance/gait - Impaired balance/gait - -  Follow up Education provided - Falls evaluation completed - -     Objective:  Allison Sharp seemed alert and oriented and she participated appropriately during our telephone visit.  Blood Pressure Weight BMI  BP Readings from Last 3 Encounters:  06/11/20 116/77  05/25/20 116/77  10/08/19 108/66   Wt Readings from Last 3 Encounters:  06/11/20 (!) 363 lb (164.7 kg)  05/25/20 (!) 363 lb 4 oz (164.8 kg)  01/26/20 (!) 380 lb (172.4 kg)   BMI Readings from Last 1 Encounters:  06/11/20 53.61  kg/m    *Unable to obtain current vital signs, weight, and BMI due to telephone visit type  Hearing/Vision  . Allison Sharp did not seem to have difficulty with hearing/understanding during the telephone conversation . Reports that she has not had a formal eye exam by an eye care professional within the past year . Reports that she has not had a formal hearing evaluation within the past year *Unable to fully assess hearing and vision during telephone visit type  Cognitive Function: 6CIT Screen 06/11/2020 09/30/2018  What Year? 0 points 0 points  What month? 0 points 0 points  What time? 0 points 0 points  Count back from 20 0 points 0 points  Months in reverse 0 points 0 points  Repeat phrase 2 points 2 points  Total Score 2 2   (Normal:0-7, Significant for Dysfunction: >8)  Normal Cognitive Function Screening: Yes   Immunization & Health Maintenance Record Immunization History  Administered Date(s) Administered  . Influenza Split 04/09/2013  . Influenza,inj,Quad PF,6+ Mos 03/03/2014, 03/01/2015, 03/01/2015, 03/14/2017, 03/13/2018, 04/11/2019  . Influenza-Unspecified 03/01/2016, 03/15/2017, 03/29/2018  . PFIZER SARS-COV-2 Vaccination 07/25/2019, 08/20/2019, 02/27/2020  . Pneumococcal Conjugate-13 05/30/2011, 12/29/2016  . Pneumococcal Polysaccharide-23 05/12/2019  . Pneumococcal-Unspecified 05/12/2019  . Tdap 09/30/2012  . Zoster 07/02/2015    Health Maintenance  Topic Date Due  . DEXA SCAN  Never done  . INFLUENZA VACCINE  12/28/2019  . MAMMOGRAM  05/24/2020  . COLONOSCOPY (Pts 45-53yrs Insurance coverage will need to be confirmed)  08/25/2020 (Originally 05/29/2017)  . TETANUS/TDAP  10/01/2022  . COVID-19 Vaccine  Completed  . Hepatitis C Screening  Completed  . PNA vac Low Risk Adult  Completed       Assessment  This is a routine wellness examination for Allison Sharp.  Health Maintenance: Due or Overdue Health Maintenance Due  Topic Date Due  . DEXA SCAN  Never  done  . INFLUENZA VACCINE  12/28/2019  . MAMMOGRAM  05/24/2020    Allison Sharp does not need a referral for Community Assistance: Care Management:   no Social Work:    no Prescription Assistance:  no Nutrition/Diabetes Education:  no   Plan:  Personalized Goals Goals Addressed            This Visit's Progress   . 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  Encouraged her to join this exercise group, and try to exercise for 30 minutes at least 3 times per week    . Have 3  meals a day   No change    Include mostly lean proteins, vegetables, whole grains and fruits in moderation      Personalized Health Maintenance & Screening Recommendations  up to date  Lung Cancer Screening Recommended: no (Low Dose CT Chest recommended if Age 6-80 years, 30 pack-year currently smoking OR have quit w/in past 15 years) Hepatitis C Screening recommended: no HIV Screening recommended: no  Advanced Directives: Written information was not prepared per patient's request.  Referrals & Orders No orders of the defined types were placed in this encounter.   Follow-up Plan . Follow-up with Dettinger, Fransisca Kaufmann, MD as planned    I have personally reviewed and noted the following in the patient's chart:   . Medical and social history . Use of alcohol, tobacco or illicit drugs  . Current medications and supplements . Functional ability and status . Nutritional status . Physical activity . Advanced directives . List of other physicians . Hospitalizations, surgeries, and ER visits in previous 12 months . Vitals . Screenings to include cognitive, depression, and falls . Referrals and appointments  In addition, I have reviewed and discussed with Allison Sharp certain preventive protocols, quality metrics, and best practice recommendations. A written personalized care plan for preventive services as well as general  preventive health recommendations is available and can be mailed to the patient at her request.      [place] Dec  06/11/2020

## 2020-06-15 ENCOUNTER — Ambulatory Visit (INDEPENDENT_AMBULATORY_CARE_PROVIDER_SITE_OTHER): Payer: Medicare Other | Admitting: Psychiatry

## 2020-06-15 ENCOUNTER — Other Ambulatory Visit: Payer: Self-pay

## 2020-06-15 ENCOUNTER — Encounter (HOSPITAL_COMMUNITY): Payer: Self-pay | Admitting: Psychiatry

## 2020-06-15 VITALS — Wt 363.0 lb

## 2020-06-15 DIAGNOSIS — F3160 Bipolar disorder, current episode mixed, unspecified: Secondary | ICD-10-CM | POA: Diagnosis not present

## 2020-06-15 DIAGNOSIS — G3184 Mild cognitive impairment, so stated: Secondary | ICD-10-CM | POA: Diagnosis not present

## 2020-06-15 DIAGNOSIS — F411 Generalized anxiety disorder: Secondary | ICD-10-CM

## 2020-06-15 MED ORDER — DULOXETINE HCL 60 MG PO CPEP: 60.0000 mg | ORAL_CAPSULE | Freq: Every morning | ORAL | 2 refills | Status: DC

## 2020-06-15 MED ORDER — TRAZODONE HCL 100 MG PO TABS: 100.0000 mg | ORAL_TABLET | Freq: Every day | ORAL | 2 refills | Status: DC

## 2020-06-15 MED ORDER — RISPERIDONE 4 MG PO TABS: 4.0000 mg | ORAL_TABLET | Freq: Every day | ORAL | 2 refills | Status: DC

## 2020-06-15 MED ORDER — DIVALPROEX SODIUM 500 MG PO DR TAB: 500.0000 mg | DELAYED_RELEASE_TABLET | Freq: Two times a day (BID) | ORAL | 2 refills | Status: DC

## 2020-06-15 MED ORDER — BENZTROPINE MESYLATE 0.5 MG PO TABS: 0.5000 mg | ORAL_TABLET | Freq: Every day | ORAL | 2 refills | Status: DC

## 2020-06-15 NOTE — Progress Notes (Signed)
Virtual Visit via Telephone Note  I connected with Allison Sharp on 06/15/20 at 10:00 AM EST by telephone and verified that I am speaking with the correct person using two identifiers.  Location: Patient: Home Provider: Home Office   I discussed the limitations, risks, security and privacy concerns of performing an evaluation and management service by telephone and the availability of in person appointments. I also discussed with the patient that there may be a patient responsible charge related to this service. The patient expressed understanding and agreed to proceed.   History of Present Illness: Patient is evaluated by phone session.  She is on the phone by herself.  She had a very good Christmas and she spent time with her sister and friends.  She is happy because she bought a car and sometimes she take her car to the grocery store.  She still have mild memory impairment but overall she feels her mood is stable.  She also started walking regularly.  She does not need oxygen during the day but she does use oxygen at nighttime.  She has mild tremors but it does not interfere in her daily activities.  We have cut down the Risperdal morning dose and that help her as she is not feeling as tired during the day.  She is able to do her ADLs without any issue.  Recently she had UTI but she is now without symptoms.  We have recommended Depakote level but it was not done.  I will forward my note to her PCP for Depakote level.  Patient denies any paranoia, hallucination, anger or any suicidal thoughts.  She is trying to lose weight.  She denies any crying spells or panic attack.  Past Psychiatric History:Reviewed. H/Opsychiatric illness with multiple inpatient treatment. [facility] admissions [facility].H/Osuicidal attempt by taking overdose. Diagnosed with PTSD, depression, bipolar disorder. Tried Paxil, Prozac, Wellbutrin, Effexor, Lexapro, amitriptyline, Cymbalta, Abilify  (Tremors), Neurontin, trazodone, Thorazine, hydroxyzine, Luvox, Sinequan, Zyprexa and Latuda. Best responded on Risperdal and Cymbalta.   Recent Results (from the past 2160 hour(s))  Urinalysis, Complete     Status: Abnormal   Collection Time: 05/25/20  3:18 PM  Result Value Ref Range   Specific Gravity, UA 1.020 1.005 - 1.030   pH, UA 8.5 (H) 5.0 - 7.5   Color, UA Yellow Yellow   Appearance Ur Clear Clear   Leukocytes,UA Negative Negative   Protein,UA Negative Negative/Trace   Glucose, UA Negative Negative   Ketones, UA Trace (A) Negative   RBC, UA Negative Negative   Bilirubin, UA Negative Negative   Urobilinogen, Ur 4.0 (H) 0.2 - 1.0 mg/dL   Nitrite, UA Negative Negative   Microscopic Examination See below:   Microscopic Examination     Status: None   Collection Time: 05/25/20  3:18 PM   Urine  Result Value Ref Range   WBC, UA 0-5 0 - 5 /hpf   RBC None seen 0 - 2 /hpf   Epithelial Cells (non renal) 0-10 0 - 10 /hpf   Bacteria, UA Few None seen/Few  Urine Culture     Status: None   Collection Time: 05/25/20  3:37 PM   Specimen: Urine   UR  Result Value Ref Range   Urine Culture, Routine Final report    Organism ID, Bacteria Comment     Comment: Mixed urogenital flora Less than 10,000 colonies/mL     Psychiatric Specialty Exam: Physical Exam  Review of Systems  Weight (!) 363 lb (  164.7 kg), last menstrual period 02/18/1995.There is no height or weight on file to calculate BMI.  General Appearance: NA  Eye Contact:  NA  Speech:  Slow  Volume:  Normal  Mood:  Euthymic  Affect:  NA  Thought Process:  Goal Directed  Orientation:  Full (Time, Place, and Person)  Thought Content:  Logical  Suicidal Thoughts:  No  Homicidal Thoughts:  No  Memory:  Immediate;   Fair Recent;   Fair Remote;   Fair  Judgement:  Intact  Insight:  Present  Psychomotor Activity:  Tremor  Concentration:  Concentration: Fair and Attention Span: Fair  Recall:  [employer] of Knowledge:   Fair  Language:  Good  Akathisia:  No  Handed:  Right  AIMS (if indicated):     Assets:  Communication Skills Desire for Improvement Housing Resilience Social Support  ADL's:  Intact  Cognition:  Impaired,  Mild  Sleep:   ok      Assessment and Plan: Bipolar disorder type I.  Generalized anxiety disorder.  Mild cognitive impairment.  Patient doing better since we cut down her morning dose of Risperdal.  She is still taking multiple medication and I recommend that she can try cutting down the Cogentin from 1 mg to 0.5 mg at bedtime.  She agreed to give a try.  However I recommend if tremors come back then she can take the full dose.  Discussed medication side effects and benefits.  She has not have Depakote level and we will forward my note to her PCP to get Depakote level.  Continue trazodone 100 mg at bedtime, Depakote 500 mg twice a day, Cymbalta 60 mg daily, reduce Cogentin 0.5 mg at bedtime and continue Risperdal 4 mg at bedtime.  Recommended to call us back if she is any question or any concern.  Follow-up in 3 months.  Follow Up Instructions:    I discussed the assessment and treatment plan with the patient. The patient was provided an opportunity to ask questions and all were answered. The patient agreed with the plan and demonstrated an understanding of the instructions.   The patient was advised to call back or seek an in-person evaluation if the symptoms worsen or if the condition fails to improve as anticipated.  I provided 18 minutes of non-face-to-face time during this encounter.   Kathlee Nations, MD

## 2020-06-23 ENCOUNTER — Other Ambulatory Visit: Payer: Self-pay

## 2020-06-23 ENCOUNTER — Ambulatory Visit
Admission: RE | Admit: 2020-06-23 | Discharge: 2020-06-23 | Disposition: A | Discharge status: Home / Self Care | Payer: Medicare Other | Source: Ambulatory Visit | Attending: Family Medicine | Admitting: Family Medicine

## 2020-06-23 DIAGNOSIS — Z1231 Encounter for screening mammogram for malignant neoplasm of breast: Secondary | ICD-10-CM

## 2020-07-07 DIAGNOSIS — G4733 Obstructive sleep apnea (adult) (pediatric): Secondary | ICD-10-CM | POA: Diagnosis not present

## 2020-07-07 DIAGNOSIS — J449 Chronic obstructive pulmonary disease, unspecified: Secondary | ICD-10-CM | POA: Diagnosis not present

## 2020-07-14 ENCOUNTER — Other Ambulatory Visit: Payer: Self-pay

## 2020-07-14 ENCOUNTER — Encounter: Payer: Self-pay | Admitting: Family Medicine

## 2020-07-14 ENCOUNTER — Ambulatory Visit (INDEPENDENT_AMBULATORY_CARE_PROVIDER_SITE_OTHER): Payer: Medicare Other | Admitting: Family Medicine

## 2020-07-14 VITALS — BP 111/74 | HR 91 | Temp 97.6°F | Resp 20 | Ht 69.0 in | Wt 362.0 lb

## 2020-07-14 DIAGNOSIS — E039 Hypothyroidism, unspecified: Secondary | ICD-10-CM

## 2020-07-14 DIAGNOSIS — H539 Unspecified visual disturbance: Secondary | ICD-10-CM | POA: Diagnosis not present

## 2020-07-14 DIAGNOSIS — R519 Headache, unspecified: Secondary | ICD-10-CM | POA: Diagnosis not present

## 2020-07-14 DIAGNOSIS — I10 Essential (primary) hypertension: Secondary | ICD-10-CM | POA: Diagnosis not present

## 2020-07-14 DIAGNOSIS — E785 Hyperlipidemia, unspecified: Secondary | ICD-10-CM

## 2020-07-14 NOTE — Progress Notes (Signed)
BP 111/74   Pulse 91   Temp 97.6 F (36.4 C) (Temporal)   Resp 20   Ht '5\' 9"'  (1.753 m)   Wt (!) 362 lb (164.2 kg)   LMP 02/18/1995   SpO2 96%   BMI 53.46 kg/m    Subjective:   Patient ID: Allison Sharp, female    DOB: 18-Aug-1955, 65 y.o.   MRN: [id number]  HPI: Allison Sharp is a 65 y.o. female presenting on 07/14/2020 for Migraine (Knot under chin/)   HPI Hypertension Patient is currently on clonidine and lisinopril, and their blood pressure today is 111/74. Patient denies any lightheadedness or dizziness. Patient denies headaches, blurred vision, chest pains, shortness of breath, or weakness. Denies any side effects from medication and is content with current medication.   Hyperlipidemia Patient is coming in for recheck of his hyperlipidemia. The patient is currently taking atorvastatin. They deny any issues with myalgias or history of liver damage from it. They deny any focal numbness or weakness or chest pain.   Hypothyroidism recheck Patient is coming in for thyroid recheck today as well. They deny any issues with hair changes or heat or cold problems or diarrhea or constipation. They deny any chest pain or palpitations. They are currently on levothyroxine 125 micrograms   patient comes in complaining of headaches and visual disturbances.  She says is been going on for about 1 month.  She says sometimes the headaches will be in the back of her head then sometimes they will be in the frontal region.  Sometimes she is been having some sinus congestion.  She says over the past month the headaches been happening about every other day but not always the same.  She has been using some Tylenol and naproxen for them which have been helping.  She denies any numbness or weakness.  Patient is also coming in for recheck on the lymph node on the left side of her neck just below her jawline.  She feels like it may have gotten bigger but is still mobile.  Relevant past medical, surgical,  family and social history reviewed and updated as indicated. Interim medical history since our last visit reviewed. Allergies and medications reviewed and updated.  Review of Systems  Constitutional: Negative for chills and fever.  Eyes: Negative for visual disturbance.  Respiratory: Negative for chest tightness and shortness of breath.   Cardiovascular: Negative for chest pain and leg swelling.  Musculoskeletal: Negative for back pain and gait problem.  Skin: Negative for rash.  Neurological: Negative for light-headedness and headaches.  Psychiatric/Behavioral: Negative for agitation and behavioral problems.  All other systems reviewed and are negative.   Per HPI unless specifically indicated above   Allergies as of 07/14/2020      Reactions   Abilify [aripiprazole] Other (See Comments)   hallucinations   Naproxen Nausea And Vomiting, Swelling   Ativan [lorazepam] Other (See Comments)   Delirium   Haldol [haloperidol] Other (See Comments)   Hallucinating    Hydroxyzine Other (See Comments)   hallucinations      Medication List       Accurate as of July 14, 2020  9:30 AM. If you have any questions, ask your nurse or doctor.        albuterol 108 (90 Base) MCG/ACT inhaler Commonly known as: ProAir HFA USE 2 PUFF EVERY 4 HOURS AS NEEDED FOR WHEEZING OR SHORTNESS OF BREATH   atorvastatin 20 MG tablet Commonly known as: LIPITOR Take 1 tablet (  20 mg total) by mouth daily.   benztropine 0.5 MG tablet Commonly known as: COGENTIN Take 1 tablet (0.5 mg total) by mouth at bedtime.   budesonide-formoterol 80-4.5 MCG/ACT inhaler Commonly known as: SYMBICORT Inhale 2 puffs into the lungs in the morning and at bedtime.   celecoxib 200 MG capsule Commonly known as: CELEBREX Take 1 capsule (200 mg total) by mouth 2 (two) times daily.   cetirizine 10 MG tablet Commonly known as: ZYRTEC TAKE 1 TABLET ONCE DAILY   cloNIDine 0.1 MG tablet Commonly known as: CATAPRES Take  1 tablet (0.1 mg total) by mouth at bedtime.   diclofenac sodium 1 % Gel Commonly known as: Voltaren Apply to affected area twice daily as needed What changed:   how much to take  how to take this  when to take this  reasons to take this  additional instructions   divalproex 500 MG DR tablet Commonly known as: DEPAKOTE Take 1 tablet (500 mg total) by mouth 2 (two) times daily.   DULoxetine 60 MG capsule Commonly known as: CYMBALTA Take 1 capsule (60 mg total) by mouth in the morning.   fluticasone 50 MCG/ACT nasal spray Commonly known as: FLONASE Place 1 spray 2 (two) times daily as needed into both nostrils for allergies or rhinitis.   levothyroxine 125 MCG tablet Commonly known as: SYNTHROID Take 1 tablet (125 mcg total) by mouth daily.   lisinopril 5 MG tablet Commonly known as: ZESTRIL Take 1 tablet (5 mg total) by mouth daily.   naproxen 500 MG EC tablet Commonly known as: EC NAPROSYN Take 1 tablet (500 mg total) by mouth 2 (two) times daily with a meal.   nystatin cream Commonly known as: MYCOSTATIN APPLY TO THE AFFECTED AREA THREE TIMES DAILY AS NEEDED .Marland KitchenFOR TOPICAL USE ONLY... What changed: See the new instructions.   omeprazole 20 MG capsule Commonly known as: PRILOSEC Take 1 capsule (20 mg total) by mouth daily.   OXYGEN Inhale 2 L into the lungs daily.   risperidone 4 MG tablet Commonly known as: RISPERDAL Take 1 tablet (4 mg total) by mouth at bedtime.   therapeutic multivitamin-minerals tablet Take 1 tablet by mouth daily.   traZODone 100 MG tablet Commonly known as: DESYREL Take 1 tablet (100 mg total) by mouth at bedtime.   Vitamin D (Ergocalciferol) 1.25 MG (50000 UNIT) Caps capsule Commonly known as: DRISDOL TAKE 1 CAPSULE EVERY 7 SEVEN DAYS        Objective:   BP 111/74   Pulse 91   Temp 97.6 F (36.4 C) (Temporal)   Resp 20   Ht '5\' 9"'  (1.753 m)   Wt (!) 362 lb (164.2 kg)   LMP 02/18/1995   SpO2 96%   BMI 53.46 kg/m    Wt Readings from Last 3 Encounters:  07/14/20 (!) 362 lb (164.2 kg)  06/11/20 (!) 363 lb (164.7 kg)  05/25/20 (!) 363 lb 4 oz (164.8 kg)    Physical Exam Vitals and nursing note reviewed.  Constitutional:      General: She is not in acute distress.    Appearance: She is well-developed and well-nourished. She is not diaphoretic.  Eyes:     Extraocular Movements: Extraocular movements intact and EOM normal.     Conjunctiva/sclera: Conjunctivae normal.     Pupils: Pupils are equal, round, and reactive to light.  Cardiovascular:     Rate and Rhythm: Normal rate and regular rhythm.     Pulses: Intact distal pulses.  Heart sounds: Normal heart sounds. No murmur heard.   Pulmonary:     Effort: Pulmonary effort is normal. No respiratory distress.     Breath sounds: Normal breath sounds. No wheezing.  Musculoskeletal:        General: No tenderness or edema. Normal range of motion.  Skin:    General: Skin is warm and dry.     Findings: No rash.  Neurological:     Mental Status: She is alert and oriented to person, place, and time.     Cranial Nerves: No cranial nerve deficit.     Sensory: No sensory deficit.     Motor: No weakness.     Coordination: Coordination normal.  Psychiatric:        Mood and Affect: Mood and affect normal.        Behavior: Behavior normal.       Assessment & Plan:   Problem List Items Addressed This Visit      Cardiovascular and Mediastinum   Essential hypertension, benign (Chronic)   Relevant Orders   CMP14+EGFR     Endocrine   Hypothyroidism - Primary (Chronic)   Relevant Orders   CBC with Differential/Platelet   TSH     Other   Hyperlipidemia LDL goal <130   Relevant Orders   Lipid panel    Other Visit Diagnoses    Frequent headaches       Visual disturbance          Recommend to get vision checked.  Headaches likely due to that, versus sinus, also recommended Flonase and continue with Tylenol.  After she gets a visual check  if she still having issues then come discuss it with [name].  But I do think the most important would be the vision first. Follow up plan: Return in about 6 months (around 01/11/2021), or if symptoms worsen or fail to improve, for Hypertension and thyroid and cholesterol.  Counseling provided for all of the vaccine components No orders of the defined types were placed in this encounter.   [name], MD [facility] 07/14/2020, 9:30 AM

## 2020-07-15 ENCOUNTER — Other Ambulatory Visit: Payer: Self-pay | Admitting: *Deleted

## 2020-07-15 ENCOUNTER — Telehealth: Payer: Self-pay

## 2020-07-15 LAB — CBC WITH DIFFERENTIAL/PLATELET
Basophils Absolute: 0.1 10*3/uL (ref 0.0–0.2)
Basos: 1 %
EOS (ABSOLUTE): 0.2 10*3/uL (ref 0.0–0.4)
Eos: 3 %
Hematocrit: 39.6 % (ref 34.0–46.6)
Hemoglobin: 13.6 g/dL (ref 11.1–15.9)
Immature Grans (Abs): 0 10*3/uL (ref 0.0–0.1)
Immature Granulocytes: 0 %
Lymphocytes Absolute: 2.3 10*3/uL (ref 0.7–3.1)
Lymphs: 36 %
MCH: 31.1 pg (ref 26.6–33.0)
MCHC: 34.3 g/dL (ref 31.5–35.7)
MCV: 90 fL (ref 79–97)
Monocytes Absolute: 0.6 10*3/uL (ref 0.1–0.9)
Monocytes: 9 %
Neutrophils Absolute: 3.3 10*3/uL (ref 1.4–7.0)
Neutrophils: 51 %
Platelets: 160 10*3/uL (ref 150–450)
RBC: 4.38 x10E6/uL (ref 3.77–5.28)
RDW: 13 % (ref 11.7–15.4)
WBC: 6.5 10*3/uL (ref 3.4–10.8)

## 2020-07-15 LAB — CMP14+EGFR
ALT: 12 IU/L (ref 0–32)
AST: 16 IU/L (ref 0–40)
Albumin/Globulin Ratio: 1.8 (ref 1.2–2.2)
Albumin: 4.2 g/dL (ref 3.8–4.8)
Alkaline Phosphatase: 82 IU/L (ref 44–121)
BUN/Creatinine Ratio: 10 — ABNORMAL LOW (ref 12–28)
BUN: 10 mg/dL (ref 8–27)
Bilirubin Total: 0.4 mg/dL (ref 0.0–1.2)
CO2: 26 mmol/L (ref 20–29)
Calcium: 9.8 mg/dL (ref 8.7–10.3)
Chloride: 102 mmol/L (ref 96–106)
Creatinine, Ser: 0.97 mg/dL (ref 0.57–1.00)
GFR calc Af Amer: 70 mL/min/{1.73_m2} (ref >59)
GFR calc non Af Amer: 61 mL/min/{1.73_m2} (ref >59)
Globulin, Total: 2.3 g/dL (ref 1.5–4.5)
Glucose: 87 mg/dL (ref 65–99)
Potassium: 4.8 mmol/L (ref 3.5–5.2)
Sodium: 141 mmol/L (ref 134–144)
Total Protein: 6.5 g/dL (ref 6.0–8.5)

## 2020-07-15 LAB — LIPID PANEL
Chol/HDL Ratio: 4.1 ratio (ref 0.0–4.4)
Cholesterol, Total: 155 mg/dL (ref 100–199)
HDL: 38 mg/dL — ABNORMAL LOW (ref >39)
LDL Chol Calc (NIH): 84 mg/dL (ref 0–99)
Triglycerides: 191 mg/dL — ABNORMAL HIGH (ref 0–149)
VLDL Cholesterol Cal: 33 mg/dL (ref 5–40)

## 2020-07-15 LAB — TSH: TSH: 3.42 u[IU]/mL (ref 0.450–4.500)

## 2020-07-15 MED ORDER — FUROSEMIDE 20 MG PO TABS: ORAL_TABLET | ORAL | 3 refills | Status: DC

## 2020-07-15 NOTE — Telephone Encounter (Signed)
done

## 2020-07-15 NOTE — Telephone Encounter (Signed)
Pt saw Dettinger yesterday and she says that she forgot to tell him that she needs a refill on Lasix 20 MG. Please send to [facility]

## 2020-07-15 NOTE — Telephone Encounter (Signed)
Yes go ahead and send a refill for 6 months for her

## 2020-07-21 ENCOUNTER — Other Ambulatory Visit: Payer: Self-pay | Admitting: Family Medicine

## 2020-07-21 ENCOUNTER — Telehealth: Payer: Self-pay

## 2020-07-21 DIAGNOSIS — G8929 Other chronic pain: Secondary | ICD-10-CM

## 2020-07-21 DIAGNOSIS — M545 Low back pain, unspecified: Secondary | ICD-10-CM

## 2020-07-21 NOTE — Telephone Encounter (Signed)
Per Dettinger, since Tramadol was not discussed at the last visit pt needs to be seen in the office.  Appt scheduled for 07/22/20 at 8:55

## 2020-07-22 ENCOUNTER — Ambulatory Visit (INDEPENDENT_AMBULATORY_CARE_PROVIDER_SITE_OTHER): Payer: Medicare Other | Admitting: Family Medicine

## 2020-07-22 ENCOUNTER — Encounter: Payer: Self-pay | Admitting: Family Medicine

## 2020-07-22 DIAGNOSIS — J440 Chronic obstructive pulmonary disease with acute lower respiratory infection: Secondary | ICD-10-CM | POA: Diagnosis not present

## 2020-07-22 DIAGNOSIS — J209 Acute bronchitis, unspecified: Secondary | ICD-10-CM

## 2020-07-22 LAB — VERITOR FLU A/B WAIVED
Influenza A: NEGATIVE
Influenza B: NEGATIVE

## 2020-07-22 MED ORDER — AMOXICILLIN-POT CLAVULANATE 875-125 MG PO TABS: 1.0000 | ORAL_TABLET | Freq: Two times a day (BID) | ORAL | 0 refills | Status: DC

## 2020-07-22 MED ORDER — PREDNISONE 20 MG PO TABS: ORAL_TABLET | ORAL | 0 refills | Status: DC

## 2020-07-22 NOTE — Addendum Note (Signed)
Addended by: Liliane Bade on: 07/22/2020 09:39 AM   Modules accepted: Orders

## 2020-07-22 NOTE — Progress Notes (Signed)
Virtual Visit via telephone Note  I connected with Allison Sharp on 07/22/20 at 0913 by telephone and verified that I am speaking with the correct person using two identifiers. Allison Sharp is currently located at home and guardian are currently with her during visit. The provider, Fransisca Kaufmann Deaundra Dupriest, MD is located in their office at time of visit.  Call ended at [phone]  I discussed the limitations, risks, security and privacy concerns of performing an evaluation and management service by telephone and the availability of in person appointments. I also discussed with the patient that there may be a patient responsible charge related to this service. The patient expressed understanding and agreed to proceed.   History and Present Illness: Patient is having cough and congestion and dizziness when getting up from bed when getting up.  She has productive cough and these are symptoms for 3 days.  She denies fevers but is cold and has chills. She denies body aches.  She denies sick contacts that she knows of.  She says she has shortness of breath and wheezing.    1. Acute bronchitis with COPD [facility])     Outpatient Encounter Medications as of 07/22/2020  Medication Sig  . amoxicillin-clavulanate (AUGMENTIN) 875-125 MG tablet Take 1 tablet by mouth 2 (two) times daily.  . predniSONE (DELTASONE) 20 MG tablet 2 po at same time daily for 5 days  . albuterol (PROAIR HFA) 108 (90 Base) MCG/ACT inhaler USE 2 PUFF EVERY 4 HOURS AS NEEDED FOR WHEEZING OR SHORTNESS OF BREATH  . atorvastatin (LIPITOR) 20 MG tablet Take 1 tablet (20 mg total) by mouth daily.  . benztropine (COGENTIN) 0.5 MG tablet Take 1 tablet (0.5 mg total) by mouth at bedtime.  . budesonide-formoterol (SYMBICORT) 80-4.5 MCG/ACT inhaler Inhale 2 puffs into the lungs in the morning and at bedtime.  . celecoxib (CELEBREX) 200 MG capsule Take 1 capsule (200 mg total) by mouth 2 (two) times daily.  . cetirizine (ZYRTEC) 10 MG tablet TAKE 1  TABLET ONCE DAILY  . cloNIDine (CATAPRES) 0.1 MG tablet Take 1 tablet (0.1 mg total) by mouth at bedtime.  . diclofenac sodium (VOLTAREN) 1 % GEL Apply to affected area twice daily as needed (Patient taking differently: Apply 2 g topically 2 (two) times daily as needed (PAIN).)  . divalproex (DEPAKOTE) 500 MG DR tablet Take 1 tablet (500 mg total) by mouth 2 (two) times daily.  . DULoxetine (CYMBALTA) 60 MG capsule Take 1 capsule (60 mg total) by mouth in the morning.  . fluticasone (FLONASE) 50 MCG/ACT nasal spray Place 1 spray 2 (two) times daily as needed into both nostrils for allergies or rhinitis.  . furosemide (LASIX) 20 MG tablet Take 20 mg by mouth as needed. Takes if gaines 3 pounds  . levothyroxine (SYNTHROID) 125 MCG tablet Take 1 tablet (125 mcg total) by mouth daily.  Marland Kitchen lisinopril (ZESTRIL) 5 MG tablet Take 1 tablet (5 mg total) by mouth daily.  . naproxen (EC NAPROSYN) 500 MG EC tablet Take 1 tablet (500 mg total) by mouth 2 (two) times daily with a meal.  . nystatin cream (MYCOSTATIN) APPLY TO THE AFFECTED AREA THREE TIMES DAILY AS NEEDED .Marland KitchenFOR TOPICAL USE ONLY... (Patient taking differently: Apply 1 application topically 3 (three) times daily as needed for dry skin.)  . omeprazole (PRILOSEC) 20 MG capsule Take 1 capsule (20 mg total) by mouth daily.  . OXYGEN Inhale 2 L into the lungs daily.  . risperidone (RISPERDAL) 4 MG  tablet Take 1 tablet (4 mg total) by mouth at bedtime.  Marland Kitchen therapeutic multivitamin-minerals (THERAGRAN-M) tablet Take 1 tablet by mouth daily.  . traZODone (DESYREL) 100 MG tablet Take 1 tablet (100 mg total) by mouth at bedtime.  . Vitamin D, Ergocalciferol, (DRISDOL) 1.25 MG (50000 UT) CAPS capsule TAKE 1 CAPSULE EVERY 7 SEVEN DAYS   No facility-administered encounter medications on file as of 07/22/2020.    Review of Systems  Constitutional: Positive for chills. Negative for fever.  HENT: Positive for congestion, postnasal drip, rhinorrhea, sinus pressure,  sneezing and sore throat. Negative for ear discharge and ear pain.   Eyes: Negative for pain, redness and visual disturbance.  Respiratory: Positive for cough, shortness of breath and wheezing. Negative for chest tightness.   Cardiovascular: Negative for chest pain and leg swelling.  Musculoskeletal: Negative for back pain, gait problem and myalgias.  Skin: Negative for rash.  Neurological: Negative for light-headedness and headaches.  Psychiatric/Behavioral: Negative for agitation and behavioral problems.  All other systems reviewed and are negative.   Observations/Objective: Patient sounds comfortable and in no acute distress  Assessment and Plan: Problem List Items Addressed This Visit   None   Visit Diagnoses    Acute bronchitis with COPD ([place])    -  Primary   Relevant Medications   amoxicillin-clavulanate (AUGMENTIN) 875-125 MG tablet   predniSONE (DELTASONE) 20 MG tablet   Other Relevant Orders   Veritor Flu A/B Waived   Novel Coronavirus, NAA ([facility])       Follow up plan: Return if symptoms worsen or fail to improve.     I discussed the assessment and treatment plan with the patient. The patient was provided an opportunity to ask questions and all were answered. The patient agreed with the plan and demonstrated an understanding of the instructions.   The patient was advised to call back or seek an in-person evaluation if the symptoms worsen or if the condition fails to improve as anticipated.  The above assessment and management plan was discussed with the patient. The patient verbalized understanding of and has agreed to the management plan. Patient is aware to call the clinic if symptoms persist or worsen. Patient is aware when to return to the clinic for a follow-up visit. Patient educated on when it is appropriate to go to the emergency department.    I provided 8 minutes of non-face-to-face time during this encounter.    Worthy Rancher, MD

## 2020-07-23 LAB — SARS-COV-2, NAA 2 DAY TAT

## 2020-07-23 LAB — NOVEL CORONAVIRUS, NAA: SARS-CoV-2, NAA: NOT DETECTED

## 2020-07-27 ENCOUNTER — Ambulatory Visit: Payer: Medicare Other | Admitting: Orthopaedic Surgery

## 2020-07-27 ENCOUNTER — Ambulatory Visit: Payer: Medicare Other

## 2020-07-27 ENCOUNTER — Encounter: Payer: Self-pay | Admitting: Orthopaedic Surgery

## 2020-07-27 ENCOUNTER — Other Ambulatory Visit: Payer: Self-pay

## 2020-07-27 VITALS — BP 143/93 | HR 97 | Ht 69.0 in | Wt 357.0 lb

## 2020-07-27 DIAGNOSIS — M79672 Pain in left foot: Secondary | ICD-10-CM | POA: Diagnosis not present

## 2020-07-27 DIAGNOSIS — M25572 Pain in left ankle and joints of left foot: Secondary | ICD-10-CM | POA: Diagnosis not present

## 2020-07-27 DIAGNOSIS — Z6841 Body Mass Index (BMI) 40.0 and over, adult: Secondary | ICD-10-CM | POA: Diagnosis not present

## 2020-07-27 MED ORDER — NAPROXEN 500 MG PO TABS: 500.0000 mg | ORAL_TABLET | Freq: Two times a day (BID) | ORAL | 5 refills | Status: DC

## 2020-07-27 NOTE — Progress Notes (Signed)
Subjective:    Patient ID: Allison Sharp, female    DOB: [birth year], 65 y.o.   MRN: [id number]  HPI She has pain of the left foot and ankle, more of the ankle.  She had trauma in 2014 and was in a CAM walker for six weeks.  She says the pain in the ankle started about three months ago. She has tried rest, elevation and limited activity.  It is not getting better.  She has swelling and lateral pain. She has no new trauma, no redness, no numbness.   Review of Systems  Constitutional: Positive for activity change.  Respiratory: Positive for shortness of breath.   Endocrine: Positive for cold intolerance and heat intolerance.  Musculoskeletal: Positive for arthralgias, gait problem, joint swelling and myalgias.  Allergic/Immunologic: Positive for environmental allergies and food allergies.  Neurological: Positive for headaches.  All other systems reviewed and are negative.  For Review of Systems, all other systems reviewed and are negative.  The following is a summary of the past history medically, past history surgically, known current medicines, social history and family history.  This information is gathered electronically by the computer from prior information and documentation.  I review this each visit and have found including this information at this point in the chart is beneficial and informative.   Past Medical History:  Diagnosis Date   Allergy    Anxiety    Arthritis    Asthma    Bipolar 1 disorder ([place])    CAD (coronary artery disease)    Cellulitis    CHF (congestive heart failure) (HCC)    diastolic dysfunction   Chronic back pain    Chronic headaches    Complication of anesthesia    States she typically gets sick s/p anesthesia   Contusion of sacrum    COPD (chronic obstructive pulmonary disease) (HCC)    Dementia (HCC)    Depression    Dyspnea    PFT 03/05/09 FEV1 2.77(98%), FVC 3.25(86%), FEV1% 85, TLC 5.88(99%), DLCO 60% ,  Methacholine  challenge 03/16/09 normal ,  CT chest 03/12/09 no pulmonary disease   Fungal infection    GERD (gastroesophageal reflux disease)    History of colonoscopy 10/17/2002   by Dr Laural Golden distal non-specific proctitis, small ext hemorrhoids,    HTN (hypertension)    Hyperlipidemia    Hyperthyroidism    radioactive - thyroid cancer    Migraine headache    Morbid obesity with body mass index of 50.0-59.9 in adult ([place]) JAN 2011 370 LBS   2004 311 BMI 45.9   Myocardial infarction ([place])    NOV 1997   OSA on CPAP    she had been on 2L O2 at night but that was stopped   PONV (postoperative nausea and vomiting)    PTSD (post-traumatic stress disorder)    PTSD (post-traumatic stress disorder)    Suicidal ideation    Urine incontinence    Vitamin D deficiency     Past Surgical History:  Procedure Laterality Date   ABDOMINAL HYSTERECTOMY     sept 1996   APPENDECTOMY     BACK SURGERY  2008   CARDIAC CATHETERIZATION     nov 1997   CHOLECYSTECTOMY     COLONOSCOPY  10/17/2002    Distal proctitis, small external hemorrhoids, otherwise/  normal colonoscopy. Suspect rectal bleeding secondary to hemorrhoids   ESOPHAGOGASTRODUODENOSCOPY  03/18/09   fundic gland polyps/mild gastritis   HERNIA REPAIR  1978   JOINT REPLACEMENT  bil knee replacement   KNEE ARTHROSCOPY     LEFT HEART CATH AND CORONARY ANGIOGRAPHY N/A 08/08/2018   Procedure: LEFT HEART CATH AND CORONARY ANGIOGRAPHY;  Surgeon: Jettie Booze, MD;  Location: [facility];  Service: Cardiovascular;  Laterality: N/A;   MULTIPLE EXTRACTIONS WITH ALVEOLOPLASTY N/A 08/16/2015   Procedure: EXTRACTION OF TEETH THREE, SIX, EIGHT, NINE, ELEVEN, FOURTEEN, FIFTEEN, TWENTY-EIGHT WITH ALVEOLOPLASTY;  Surgeon: Diona Browner, DDS;  Location: [facility];  Service: Oral Surgery;  Laterality: N/A;   ROTATOR CUFF REPAIR Right    TONSILLECTOMY     TOTAL VAGINAL HYSTERECTOMY     TUBAL LIGATION      Current  Outpatient Medications on File Prior to Visit  Medication Sig Dispense Refill   albuterol (PROAIR HFA) 108 (90 Base) MCG/ACT inhaler USE 2 PUFF EVERY 4 HOURS AS NEEDED FOR WHEEZING OR SHORTNESS OF BREATH 8.5 g 5   amoxicillin-clavulanate (AUGMENTIN) 875-125 MG tablet Take 1 tablet by mouth 2 (two) times daily. 20 tablet 0   atorvastatin (LIPITOR) 20 MG tablet Take 1 tablet (20 mg total) by mouth daily. 90 tablet 3   benztropine (COGENTIN) 0.5 MG tablet Take 1 tablet (0.5 mg total) by mouth at bedtime. 30 tablet 2   budesonide-formoterol (SYMBICORT) 80-4.5 MCG/ACT inhaler Inhale 2 puffs into the lungs in the morning and at bedtime.     celecoxib (CELEBREX) 200 MG capsule Take 1 capsule (200 mg total) by mouth 2 (two) times daily. 180 capsule 3   cetirizine (ZYRTEC) 10 MG tablet TAKE 1 TABLET ONCE DAILY 30 tablet 5   cloNIDine (CATAPRES) 0.1 MG tablet Take 1 tablet (0.1 mg total) by mouth at bedtime. 90 tablet 3   diclofenac sodium (VOLTAREN) 1 % GEL Apply to affected area twice daily as needed (Patient taking differently: Apply 2 g topically 2 (two) times daily as needed (PAIN).) 100 g 5   divalproex (DEPAKOTE) 500 MG DR tablet Take 1 tablet (500 mg total) by mouth 2 (two) times daily. 60 tablet 2   DULoxetine (CYMBALTA) 60 MG capsule Take 1 capsule (60 mg total) by mouth in the morning. 30 capsule 2   fluticasone (FLONASE) 50 MCG/ACT nasal spray Place 1 spray 2 (two) times daily as needed into both nostrils for allergies or rhinitis. 16 g 6   furosemide (LASIX) 20 MG tablet Take 20 mg by mouth as needed. Takes if gaines 3 pounds 30 tablet 3   levothyroxine (SYNTHROID) 125 MCG tablet Take 1 tablet (125 mcg total) by mouth daily. 90 tablet 3   lisinopril (ZESTRIL) 5 MG tablet Take 1 tablet (5 mg total) by mouth daily. 90 tablet 3   naproxen (EC NAPROSYN) 500 MG EC tablet Take 1 tablet (500 mg total) by mouth 2 (two) times daily with a meal. 60 tablet 11   nystatin cream (MYCOSTATIN)  APPLY TO THE AFFECTED AREA THREE TIMES DAILY AS NEEDED .Marland KitchenFOR TOPICAL USE ONLY... (Patient taking differently: Apply 1 application topically 3 (three) times daily as needed for dry skin.) 30 g 0   omeprazole (PRILOSEC) 20 MG capsule Take 1 capsule (20 mg total) by mouth daily. 90 capsule 3   OXYGEN Inhale 2 L into the lungs daily.     therapeutic multivitamin-minerals (THERAGRAN-M) tablet Take 1 tablet by mouth daily.     traZODone (DESYREL) 100 MG tablet Take 1 tablet (100 mg total) by mouth at bedtime. 30 tablet 2   Vitamin D, Ergocalciferol, (DRISDOL) 1.25 MG (50000 UT) CAPS capsule TAKE 1  CAPSULE EVERY 7 SEVEN DAYS 9 capsule 0   predniSONE (DELTASONE) 20 MG tablet 2 po at same time daily for 5 days (Patient not taking: Reported on 07/27/2020) 10 tablet 0   risperidone (RISPERDAL) 4 MG tablet Take 1 tablet (4 mg total) by mouth at bedtime. 30 tablet 2   No current facility-administered medications on file prior to visit.    Social History   Socioeconomic History   Marital status: Single    Spouse name: Not on file   Number of children: 2   Years of education: Not on file   Highest education level: Not on file  Occupational History   Occupation: Disabled    Employer: UNEMPLOYED    Comment: back problems  Tobacco Use   Smoking status: Never Smoker   Smokeless tobacco: Never Used  Scientific laboratory technician Use: Never used  Substance and Sexual Activity   Alcohol use: No    Alcohol/week: 0.0 standard drinks   Drug use: No   Sexual activity: Never    Birth control/protection: Abstinence  Other Topics Concern   Not on file  Social History Narrative   Ms.Loos  is disabled and lives with her sister.    She has two grown sons that she does not see often, as they live in other states. She has 5 grand children.   She has a long history of mental illness including depression, PTSD, suicidal and homicidal ideation.   She has been obese most all of her life. Her weight has  significantly impacted her QOL. She recently lost 20 lbs. By decreasing portion size & increasing proteins.    Social Determinants of Health   Financial Resource Strain: Not on file  Food Insecurity: Not on file  Transportation Needs: Not on file  Physical Activity: Not on file  Stress: Not on file  Social Connections: Not on file  Intimate Partner Violence: Not on file    Family History  Adopted: Yes  Problem Relation Age of Onset   Hypertension Mother    Bipolar disorder Mother    Dementia Mother    Depression Mother    AAA (abdominal aortic aneurysm) Mother    Coronary artery disease Father    Alcohol abuse Father    Hypertension Brother    Coronary artery disease Brother    Bipolar disorder Brother    Depression Brother    Depression Sister    Paranoid behavior Sister    Cancer Sister        breast   Bipolar disorder Sister    Depression Sister    Hypertension Sister    Arthritis Sister        knee and hand    Irregular heart beat Sister        takes eliquis    Cancer Son        thyroid   Drug abuse Son    Alcohol abuse Son    Cancer Maternal Grandfather        throat    Cancer Maternal Aunt        breast metastatized to brain   Anesthesia problems Neg Hx    Hypotension Neg Hx    Malignant hyperthermia Neg Hx    Pseudochol deficiency Neg Hx     BP (!) 143/93    Pulse 97    Ht 5\' 9"  (1.753 m)    Wt (!) 357 lb (161.9 kg)    LMP 02/18/1995    BMI 52.72 kg/m  Body mass index is 52.72 kg/m.      Objective:   Physical Exam Vitals and nursing note reviewed. Exam conducted with a chaperone present.  Constitutional:      Appearance: She is well-developed and well-nourished.  HENT:     Head: Normocephalic and atraumatic.  Eyes:     Extraocular Movements: EOM normal.     Conjunctiva/sclera: Conjunctivae normal.     Pupils: Pupils are equal, round, and reactive to light.  Cardiovascular:     Rate and Rhythm: Normal rate and  regular rhythm.     Pulses: Intact distal pulses.  Pulmonary:     Effort: Pulmonary effort is normal.  Abdominal:     Palpations: Abdomen is soft.  Musculoskeletal:     Cervical back: Normal range of motion and neck supple.       Feet:  Skin:    General: Skin is warm and dry.  Neurological:     Mental Status: She is alert and oriented to person, place, and time.     Cranial Nerves: No cranial nerve deficit.     Motor: No abnormal muscle tone.     Coordination: Coordination normal.     Deep Tendon Reflexes: Reflexes are normal and symmetric. Reflexes normal.  Psychiatric:        Mood and Affect: Mood and affect normal.        Behavior: Behavior normal.        Thought Content: Thought content normal.        Judgment: Judgment normal.   X-rays were done of the left ankle and foot, reported separately.        Assessment & Plan:   Encounter Diagnoses  Name Primary?   Pain in left ankle and joints of left foot Yes   Pain in left foot    Body mass index 50.0-59.9, adult (HCC)    Morbid obesity ([place])    I will get MRI of the left ankle as she has not improved over three months and has prior trauma.  I want to make sure no loose body is in the joint or ligamentous injury.  Return in two weeks.  Call if any problem.  Precautions discussed.   Electronically Signed Sanjuana Kava, MD 3/1/202210:49 AM

## 2020-07-28 ENCOUNTER — Ambulatory Visit (INDEPENDENT_AMBULATORY_CARE_PROVIDER_SITE_OTHER): Payer: Medicare Other | Admitting: Family Medicine

## 2020-07-28 ENCOUNTER — Encounter: Payer: Self-pay | Admitting: Family Medicine

## 2020-07-28 VITALS — BP 114/72 | HR 100 | Ht 69.0 in | Wt 358.0 lb

## 2020-07-28 DIAGNOSIS — K5909 Other constipation: Secondary | ICD-10-CM

## 2020-07-28 MED ORDER — LINACLOTIDE 290 MCG PO CAPS
290.0000 ug | ORAL_CAPSULE | Freq: Every day | ORAL | 3 refills | Status: DC
Start: 2020-07-28 — End: 2021-07-14

## 2020-07-28 NOTE — Progress Notes (Signed)
BP 114/72   Pulse 100   Ht 5\' 9"  (1.753 m)   Wt (!) 358 lb (162.4 kg)   LMP 02/18/1995   SpO2 97%   BMI 52.87 kg/m    Subjective:   Patient ID: Allison Sharp, female    DOB: 1955-10-29, 65 y.o.   MRN: [id number]  HPI: Allison Sharp is a 65 y.o. female presenting on 07/28/2020 for Constipation (All her life. No bleeding. Does have abdominal pain in lower abdomen.)   HPI Constipation Patient is coming in complaining of chronic constipation.  She says she has been having it for all of her life off and on.  But now she is gotten to where she only has a bowel movement every couple weeks.  She does have to push and strain for it.  She does say that she has a little bit of stomach cramping from the antibiotic she was put on but otherwise denies any abdominal pain.  She is still passing gas on a daily basis.  Relevant past medical, surgical, family and social history reviewed and updated as indicated. Interim medical history since our last visit reviewed. Allergies and medications reviewed and updated.  Review of Systems  Constitutional: Negative for chills and fever.  Respiratory: Negative for chest tightness and shortness of breath.   Cardiovascular: Negative for chest pain and leg swelling.  Gastrointestinal: Positive for constipation. Negative for abdominal pain, diarrhea, nausea and vomiting.  Musculoskeletal: Negative for back pain and gait problem.  Skin: Negative for rash.  Neurological: Negative for light-headedness and headaches.  Psychiatric/Behavioral: Negative for agitation and behavioral problems.  All other systems reviewed and are negative.   Per HPI unless specifically indicated above   Allergies as of 07/28/2020      Reactions   Abilify [aripiprazole] Other (See Comments)   hallucinations   Naproxen Nausea And Vomiting, Swelling   Ativan [lorazepam] Other (See Comments)   Delirium   Haldol [haloperidol] Other (See Comments)   Hallucinating    Hydroxyzine  Other (See Comments)   hallucinations      Medication List       Accurate as of July 28, 2020 11:18 AM. If you have any questions, ask your nurse or doctor.        STOP taking these medications   predniSONE 20 MG tablet Commonly known as: DELTASONE Stopped by: Fransisca Kaufmann Hagar Sadiq, MD     TAKE these medications   albuterol 108 (90 Base) MCG/ACT inhaler Commonly known as: ProAir HFA USE 2 PUFF EVERY 4 HOURS AS NEEDED FOR WHEEZING OR SHORTNESS OF BREATH   amoxicillin-clavulanate 875-125 MG tablet Commonly known as: AUGMENTIN Take 1 tablet by mouth 2 (two) times daily.   atorvastatin 20 MG tablet Commonly known as: LIPITOR Take 1 tablet (20 mg total) by mouth daily.   benztropine 0.5 MG tablet Commonly known as: COGENTIN Take 1 tablet (0.5 mg total) by mouth at bedtime.   budesonide-formoterol 80-4.5 MCG/ACT inhaler Commonly known as: SYMBICORT Inhale 2 puffs into the lungs in the morning and at bedtime.   celecoxib 200 MG capsule Commonly known as: CELEBREX Take 1 capsule (200 mg total) by mouth 2 (two) times daily.   cetirizine 10 MG tablet Commonly known as: ZYRTEC TAKE 1 TABLET ONCE DAILY   cloNIDine 0.1 MG tablet Commonly known as: CATAPRES Take 1 tablet (0.1 mg total) by mouth at bedtime.   diclofenac sodium 1 % Gel Commonly known as: Voltaren Apply to affected area twice daily as  needed What changed:   how much to take  how to take this  when to take this  reasons to take this  additional instructions   divalproex 500 MG DR tablet Commonly known as: DEPAKOTE Take 1 tablet (500 mg total) by mouth 2 (two) times daily.   DULoxetine 60 MG capsule Commonly known as: CYMBALTA Take 1 capsule (60 mg total) by mouth in the morning.   fluticasone 50 MCG/ACT nasal spray Commonly known as: FLONASE Place 1 spray 2 (two) times daily as needed into both nostrils for allergies or rhinitis.   furosemide 20 MG tablet Commonly known as: LASIX Take 20 mg  by mouth as needed. Takes if gaines 3 pounds   levothyroxine 125 MCG tablet Commonly known as: SYNTHROID Take 1 tablet (125 mcg total) by mouth daily.   lisinopril 5 MG tablet Commonly known as: ZESTRIL Take 1 tablet (5 mg total) by mouth daily.   naproxen 500 MG tablet Commonly known as: NAPROSYN Take 1 tablet (500 mg total) by mouth 2 (two) times daily with a meal.   nystatin cream Commonly known as: MYCOSTATIN APPLY TO THE AFFECTED AREA THREE TIMES DAILY AS NEEDED .Marland KitchenFOR TOPICAL USE ONLY... What changed: See the new instructions.   omeprazole 20 MG capsule Commonly known as: PRILOSEC Take 1 capsule (20 mg total) by mouth daily.   OXYGEN Inhale 2 L into the lungs daily.   risperidone 4 MG tablet Commonly known as: RISPERDAL Take 1 tablet (4 mg total) by mouth at bedtime.   therapeutic multivitamin-minerals tablet Take 1 tablet by mouth daily.   traZODone 100 MG tablet Commonly known as: DESYREL Take 1 tablet (100 mg total) by mouth at bedtime.   Vitamin D (Ergocalciferol) 1.25 MG (50000 UNIT) Caps capsule Commonly known as: DRISDOL TAKE 1 CAPSULE EVERY 7 SEVEN DAYS        Objective:   BP 114/72   Pulse 100   Ht 5\' 9"  (1.753 m)   Wt (!) 358 lb (162.4 kg)   LMP 02/18/1995   SpO2 97%   BMI 52.87 kg/m   Wt Readings from Last 3 Encounters:  07/28/20 (!) 358 lb (162.4 kg)  07/27/20 (!) 357 lb (161.9 kg)  07/14/20 (!) 362 lb (164.2 kg)    Physical Exam Vitals and nursing note reviewed.  Constitutional:      General: She is not in acute distress.    Appearance: She is well-developed and well-nourished. She is not diaphoretic.  Cardiovascular:     Pulses: Intact distal pulses.  Abdominal:     Tenderness: There is no abdominal tenderness. There is no right CVA tenderness, left CVA tenderness, guarding or rebound.  Musculoskeletal:        General: No edema.  Neurological:     Mental Status: She is alert and oriented to person, place, and time.      Coordination: Coordination normal.  Psychiatric:        Mood and Affect: Mood and affect normal.        Behavior: Behavior normal.       Assessment & Plan:   Problem List Items Addressed This Visit   None   Visit Diagnoses    Chronic constipation    -  Primary   Relevant Medications   linaclotide (LINZESS) 290 MCG CAPS capsule    Will try Linzess and see how she does.  Sent in the prescription for her.  Follow up plan: Return if symptoms worsen or fail to improve.  Counseling provided for all of the vaccine components No orders of the defined types were placed in this encounter.   [name], MD [facility] 07/28/2020, 11:18 AM

## 2020-07-29 DIAGNOSIS — Z029 Encounter for administrative examinations, unspecified: Secondary | ICD-10-CM

## 2020-08-04 DIAGNOSIS — Z981 Arthrodesis status: Secondary | ICD-10-CM | POA: Diagnosis not present

## 2020-08-04 DIAGNOSIS — M533 Sacrococcygeal disorders, not elsewhere classified: Secondary | ICD-10-CM | POA: Diagnosis not present

## 2020-08-04 DIAGNOSIS — G8929 Other chronic pain: Secondary | ICD-10-CM | POA: Diagnosis not present

## 2020-08-06 ENCOUNTER — Other Ambulatory Visit: Payer: Self-pay

## 2020-08-06 ENCOUNTER — Ambulatory Visit (HOSPITAL_COMMUNITY)
Admission: RE | Admit: 2020-08-06 | Discharge: 2020-08-06 | Disposition: A | Discharge status: Home / Self Care | Payer: Medicare Other | Source: Ambulatory Visit | Attending: Orthopaedic Surgery | Admitting: Orthopaedic Surgery

## 2020-08-06 DIAGNOSIS — M25572 Pain in left ankle and joints of left foot: Secondary | ICD-10-CM | POA: Diagnosis not present

## 2020-08-06 DIAGNOSIS — M79672 Pain in left foot: Secondary | ICD-10-CM

## 2020-08-06 DIAGNOSIS — M65872 Other synovitis and tenosynovitis, left ankle and foot: Secondary | ICD-10-CM | POA: Diagnosis not present

## 2020-08-06 DIAGNOSIS — M19072 Primary osteoarthritis, left ankle and foot: Secondary | ICD-10-CM | POA: Diagnosis not present

## 2020-08-09 ENCOUNTER — Telehealth (INDEPENDENT_AMBULATORY_CARE_PROVIDER_SITE_OTHER): Payer: Medicare Other | Admitting: Psychiatry

## 2020-08-09 ENCOUNTER — Encounter (HOSPITAL_COMMUNITY): Payer: Self-pay | Admitting: Psychiatry

## 2020-08-09 ENCOUNTER — Other Ambulatory Visit: Payer: Self-pay

## 2020-08-09 VITALS — Wt 358.0 lb

## 2020-08-09 DIAGNOSIS — G3184 Mild cognitive impairment, so stated: Secondary | ICD-10-CM

## 2020-08-09 DIAGNOSIS — F3160 Bipolar disorder, current episode mixed, unspecified: Secondary | ICD-10-CM

## 2020-08-09 DIAGNOSIS — F411 Generalized anxiety disorder: Secondary | ICD-10-CM | POA: Diagnosis not present

## 2020-08-09 MED ORDER — BENZTROPINE MESYLATE 0.5 MG PO TABS: 0.5000 mg | ORAL_TABLET | Freq: Every day | ORAL | 2 refills | Status: DC

## 2020-08-09 MED ORDER — DIVALPROEX SODIUM 500 MG PO DR TAB
500.0000 mg | DELAYED_RELEASE_TABLET | Freq: Two times a day (BID) | ORAL | 2 refills | Status: DC
Start: 2020-08-09 — End: 2020-11-09

## 2020-08-09 MED ORDER — DULOXETINE HCL 60 MG PO CPEP: 60.0000 mg | ORAL_CAPSULE | Freq: Every morning | ORAL | 2 refills | Status: DC

## 2020-08-09 MED ORDER — TRAZODONE HCL 100 MG PO TABS: 100.0000 mg | ORAL_TABLET | Freq: Every day | ORAL | 2 refills | Status: DC

## 2020-08-09 MED ORDER — RISPERIDONE 4 MG PO TABS: 4.0000 mg | ORAL_TABLET | Freq: Every day | ORAL | 2 refills | Status: DC

## 2020-08-09 NOTE — Progress Notes (Addendum)
Virtual Visit via Telephone Note  I connected with Allison Sharp on 08/09/20 at  2:40 PM EDT by telephone and verified that I am speaking with the correct person using two identifiers.  Location: Patient: Home Provider: Home Office   I discussed the limitations, risks, security and privacy concerns of performing an evaluation and management service by telephone and the availability of in person appointments. I also discussed with the patient that there may be a patient responsible charge related to this service. The patient expressed understanding and agreed to proceed.   History of Present Illness: Patient is evaluated by phone session.  She is doing better on the phone.  She admitted lately very worried, anxious and nervous about the work situation.  She was watching the television very late but now she quit watching TV.  She was worried about the war in [place].  She is sleeping better now.  She is excited about going to [place] in few weeks to visit her family member.  She is happy that she can drive without any problem.  We have cut down her Risperdal on last visit because she was feeling groggy but now she feels it was a good decision.  She is more active.  She lost few pounds since the last visit.  She is not as tired.  She is able to do her ADLs without any issues.  She feels as more independent.  She has mild tremors but that does not interfere in her daily activities.  She has chronic memory issues and she was seen by neurologist but never had any follow-up.  She is also seeing pain management and getting injections which is helping her chronic pain.  We have not had any Depakote level which we recommended to be done by PCP as patient is scheduled to have blood work.  Her labs were drawn and her cholesterol LDLs are improved but there were no Depakote level.  Patient like to keep the current medication.  Patient denies any mania, psychosis, hallucination or any severe mood swings.    Past Psychiatric History: Reviewed. H/O psychiatric illness with multiple inpatient treatment.  Last admissions in May 2020 at [facility]. H/O suicidal attempt by taking overdose.  Diagnosed with PTSD, depression, bipolar disorder.  Tried Paxil, Prozac, Wellbutrin, Effexor, Lexapro, amitriptyline, Cymbalta, Abilify (Tremors), Neurontin, trazodone, Thorazine, hydroxyzine, Luvox, Sinequan, Zyprexa and Latuda.  Best responded on Risperdal and Cymbalta.    Recent Results (from the past 2160 hour(s))  Urinalysis, Complete     Status: Abnormal   Collection Time: 05/25/20  3:18 PM  Result Value Ref Range   Specific Gravity, UA 1.020 1.005 - 1.030   pH, UA 8.5 (H) 5.0 - 7.5   Color, UA Yellow Yellow   Appearance Ur Clear Clear   Leukocytes,UA Negative Negative   Protein,UA Negative Negative/Trace   Glucose, UA Negative Negative   Ketones, UA Trace (A) Negative   RBC, UA Negative Negative   Bilirubin, UA Negative Negative   Urobilinogen, Ur 4.0 (H) 0.2 - 1.0 mg/dL   Nitrite, UA Negative Negative   Microscopic Examination See below:   Microscopic Examination     Status: None   Collection Time: 05/25/20  3:18 PM   Urine  Result Value Ref Range   WBC, UA 0-5 0 - 5 /hpf   RBC None seen 0 - 2 /hpf   Epithelial Cells (non renal) 0-10 0 - 10 /hpf   Bacteria, UA Few None seen/Few  Urine Culture  Status: None   Collection Time: 05/25/20  3:37 PM   Specimen: Urine   UR  Result Value Ref Range   Urine Culture, Routine Final report    Organism ID, Bacteria Comment     Comment: Mixed urogenital flora Less than 10,000 colonies/mL   CBC with Differential/Platelet     Status: None   Collection Time: 07/14/20 10:07 AM  Result Value Ref Range   WBC 6.5 3.4 - 10.8 x10E3/uL   RBC 4.38 3.77 - 5.28 x10E6/uL   Hemoglobin 13.6 11.1 - 15.9 g/dL   Hematocrit 39.6 34.0 - 46.6 %   MCV 90 79 - 97 fL   MCH 31.1 26.6 - 33.0 pg   MCHC 34.3 31.5 - 35.7 g/dL   RDW 13.0 11.7 - 15.4 %   Platelets  160 150 - 450 x10E3/uL   Neutrophils 51 Not Estab. %   Lymphs 36 Not Estab. %   Monocytes 9 Not Estab. %   Eos 3 Not Estab. %   Basos 1 Not Estab. %   Neutrophils Absolute 3.3 1.4 - 7.0 x10E3/uL   Lymphocytes Absolute 2.3 0.7 - 3.1 x10E3/uL   Monocytes Absolute 0.6 0.1 - 0.9 x10E3/uL   EOS (ABSOLUTE) 0.2 0.0 - 0.4 x10E3/uL   Basophils Absolute 0.1 0.0 - 0.2 x10E3/uL   Immature Granulocytes 0 Not Estab. %   Immature Grans (Abs) 0.0 0.0 - 0.1 x10E3/uL  CMP14+EGFR     Status: Abnormal   Collection Time: 07/14/20 10:07 AM  Result Value Ref Range   Glucose 87 65 - 99 mg/dL   BUN 10 8 - 27 mg/dL   Creatinine, Ser 0.97 0.57 - 1.00 mg/dL   GFR calc non Af Amer 61 >59 mL/min/1.73   GFR calc Af Amer 70 >59 mL/min/1.73    Comment: **In accordance with recommendations from the NKF-ASN Task force,**   [facility] is in the process of updating its eGFR calculation to the   2021 CKD-EPI creatinine equation that estimates kidney function   without a race variable.    BUN/Creatinine Ratio 10 (L) 12 - 28   Sodium 141 134 - 144 mmol/L   Potassium 4.8 3.5 - 5.2 mmol/L   Chloride 102 96 - 106 mmol/L   CO2 26 20 - 29 mmol/L   Calcium 9.8 8.7 - 10.3 mg/dL   Total Protein 6.5 6.0 - 8.5 g/dL   Albumin 4.2 3.8 - 4.8 g/dL   Globulin, Total 2.3 1.5 - 4.5 g/dL   Albumin/Globulin Ratio 1.8 1.2 - 2.2   Bilirubin Total 0.4 0.0 - 1.2 mg/dL   Alkaline Phosphatase 82 44 - 121 IU/L   AST 16 0 - 40 IU/L   ALT 12 0 - 32 IU/L  Lipid panel     Status: Abnormal   Collection Time: 07/14/20 10:07 AM  Result Value Ref Range   Cholesterol, Total 155 100 - 199 mg/dL   Triglycerides 191 (H) 0 - 149 mg/dL   HDL 38 (L) >39 mg/dL   VLDL Cholesterol Cal 33 5 - 40 mg/dL   LDL Chol Calc (NIH) 84 0 - 99 mg/dL   Chol/HDL Ratio 4.1 0.0 - 4.4 ratio    Comment:                                   T. Chol/HDL Ratio  Men  Women                               1/2 Avg.Risk  3.4    3.3                                    Avg.Risk  5.0    4.4                                2X Avg.Risk  9.6    7.1                                3X Avg.Risk 23.4   11.0   TSH     Status: None   Collection Time: 07/14/20 10:07 AM  Result Value Ref Range   TSH 3.420 0.450 - 4.500 uIU/mL  Novel Coronavirus, NAA ([facility])     Status: None   Collection Time: 07/22/20  9:30 AM   Specimen: Nasopharyngeal(NP) swabs in vial transport medium  Result Value Ref Range   SARS-CoV-2, NAA Not Detected Not Detected    Comment: This nucleic acid amplification test was developed and its performance characteristics determined by [employer]. Nucleic acid amplification tests include RT-PCR and TMA. This test has not been FDA cleared or approved. This test has been authorized by FDA under an Emergency Use Authorization (EUA). This test is only authorized for the duration of time the declaration that circumstances exist justifying the authorization of the emergency use of in vitro diagnostic tests for detection of SARS-CoV-2 virus and/or diagnosis of COVID-19 infection under section 564(b)(1) of the Act, 21 U.S.C. 878MVE-7(M) (1), unless the authorization is terminated or revoked sooner. When diagnostic testing is negative, the possibility of a false negative result should be considered in the context of a patient's recent exposures and the presence of clinical signs and symptoms consistent with COVID-19. An individual without symptoms of COVID-19 and who is not shedding SARS-CoV-2 virus wo uld expect to have a negative (not detected) result in this assay.   SARS-COV-2, NAA 2 DAY TAT     Status: None   Collection Time: 07/22/20  9:30 AM  Result Value Ref Range   SARS-CoV-2, NAA 2 DAY TAT Performed   Veritor Flu A/B Waived     Status: None   Collection Time: 07/22/20  9:39 AM  Result Value Ref Range   Influenza A Negative Negative   Influenza B Negative Negative    Comment: If the test is negative for  the presence of influenza A or influenza B antigen, infection due to influenza cannot be ruled-out because the antigen present in the sample may be below the detection limit of the test. It is recommended that these results be confirmed by viral culture or an FDA-cleared influenza A and B molecular assay.     Psychiatric Specialty Exam: Physical Exam  Review of Systems  Weight (!) 358 lb (162.4 kg), last menstrual period 02/18/1995.There is no height or weight on file to calculate BMI.  General Appearance: NA  Eye Contact:  NA  Speech:  Slow  Volume:  Decreased  Mood:  Anxious  Affect:  NA  Thought Process:  Descriptions of Associations: Intact  Orientation:  Full (Time, Place,  and Person)  Thought Content:  WDL  Suicidal Thoughts:  No  Homicidal Thoughts:  No  Memory:  Immediate;   Fair Recent;   Fair Remote;   Fair  Judgement:  Fair  Insight:  Fair  Psychomotor Activity:  NA  Concentration:  Concentration: Fair and Attention Span: Fair  Recall:  [employer] of Knowledge:  Good  Language:  Good  Akathisia:  No  Handed:  Right  AIMS (if indicated):     Assets:  Communication Skills Desire for Improvement Resilience Social Support  ADL's:  Intact  Cognition:  Impaired,  Mild  Sleep:   6 hrs      Assessment and Plan: Bipolar disorder type I.  Generalized anxiety disorder.  Mild cognitive impairment.  Patient doing better on her medication.  She does not want to cut down further medication as she feels current regimen is working well.  We will do Depakote level and also contact the neurologist for follow-up appointment.  Continue trazodone 100 mg at bedtime, Depakote 500 mg twice a day, Cymbalta 60 mg daily, Cogentin 0.5 mg at bedtime and Risperdal 4 mg at bedtime.  Recommended to call us back if is any question or any concern.  Follow-up in 3 months.  Follow Up Instructions:    I discussed the assessment and treatment plan with the patient. The patient was provided  an opportunity to ask questions and all were answered. The patient agreed with the plan and demonstrated an understanding of the instructions.   The patient was advised to call back or seek an in-person evaluation if the symptoms worsen or if the condition fails to improve as anticipated.  I provided 18 minutes of non-face-to-face time during this encounter.   Kathlee Nations, MD

## 2020-08-10 ENCOUNTER — Ambulatory Visit: Payer: Medicare Other | Admitting: Orthopaedic Surgery

## 2020-08-10 ENCOUNTER — Encounter: Payer: Self-pay | Admitting: Orthopaedic Surgery

## 2020-08-10 ENCOUNTER — Other Ambulatory Visit: Payer: Self-pay

## 2020-08-10 ENCOUNTER — Telehealth: Payer: Self-pay | Admitting: Orthopaedic Surgery

## 2020-08-10 DIAGNOSIS — Z6841 Body Mass Index (BMI) 40.0 and over, adult: Secondary | ICD-10-CM

## 2020-08-10 DIAGNOSIS — M25572 Pain in left ankle and joints of left foot: Secondary | ICD-10-CM

## 2020-08-10 NOTE — Progress Notes (Signed)
Patient Allison Sharp, female DOB:February 21, 1956, 65 y.o. IEP:[id number]  Chief Complaint  Patient presents with  . Ankle Pain    Go over MRI left ankle. Patient reports about the same not change    HPI  Allison Sharp is a 65 y.o. female who has persistent left ankle pain.  She had MRI which showed: IMPRESSION: 1. Mild tendinopathy and moderate tenosynovitis involving the peroneus longus and brevis tendons. No tear/rupture. 2. Suspect chronic anterior tibiofibular ligament tear (high ankle sprain) with moderate degenerative type changes at the tibiofibular joint and moderate interposed fluid. 3. Intact medial and other lateral ankle ligaments. 4. Mild tibiotalar, subtalar and midfoot degenerative changes but no osteochondral abnormality. 5. Mild fatty atrophy of the foot and ankle musculature but no muscle tear or myositis.  I have explained the findings to her.  I would not recommend any surgery.  I will give Rx for a CAM walker. We do not have one her size in office.  There is no height or weight on file to calculate BMI.  ROS  Review of Systems  All other systems reviewed and are negative.  The following is a summary of the past history medically, past history surgically, known current medicines, social history and family history.  This information is gathered electronically by the computer from prior information and documentation.  I review this each visit and have found including this information at this point in the chart is beneficial and informative.    Past Medical History:  Diagnosis Date  . Allergy   . Anxiety   . Arthritis   . Asthma   . Bipolar 1 disorder ([place])   . CAD (coronary artery disease)   . Cellulitis   . CHF (congestive heart failure) (H[facility])    diastolic dysfunction  . Chronic back pain   . Chronic headaches   . Complication of anesthesia    States she typically gets sick s/p anesthesia  . Contusion of sacrum   . COPD (chronic obstructive  pulmonary disease) ([place])   . Dementia ([place])   . Depression   . Dyspnea    PFT 03/05/09 FEV1 2.77(98%), FVC 3.25(86%), FEV1% 85, TLC 5.88(99%), DLCO 60% ,  Methacholine challenge 03/16/09 normal ,  CT chest 03/12/09 no pulmonary disease  . Fungal infection   . GERD (gastroesophageal reflux disease)   . History of colonoscopy 10/17/2002   by Dr Rehman-> distal non-specific proctitis, small ext hemorrhoids,   . HTN (hypertension)   . Hyperlipidemia   . Hyperthyroidism    radioactive - thyroid cancer   . Migraine headache   . Morbid obesity with body mass index of 50.0-59.9 in adult [facility]) JAN 2011 370 LBS   2004 311 BMI 45.9  . Myocardial infarction ([place])    NOV 1997  . OSA on CPAP    she had been on 2L O2 at night but that was stopped  . PONV (postoperative nausea and vomiting)   . PTSD (post-traumatic stress disorder)   . PTSD (post-traumatic stress disorder)   . Suicidal ideation   . Urine incontinence   . Vitamin D deficiency     Past Surgical History:  Procedure Laterality Date  . ABDOMINAL HYSTERECTOMY     sept 1996  . APPENDECTOMY    . BACK SURGERY  2008  . CARDIAC CATHETERIZATION     nov 1997  . CHOLECYSTECTOMY    . COLONOSCOPY  10/17/2002    Distal proctitis, small external hemorrhoids, otherwise/  normal colonoscopy. Suspect  rectal bleeding secondary to hemorrhoids  . ESOPHAGOGASTRODUODENOSCOPY  03/18/09   fundic gland polyps/mild gastritis  . HERNIA REPAIR  1978  . JOINT REPLACEMENT     bil knee replacement  . KNEE ARTHROSCOPY    . LEFT HEART CATH AND CORONARY ANGIOGRAPHY N/A 08/08/2018   Procedure: LEFT HEART CATH AND CORONARY ANGIOGRAPHY;  Surgeon: Jettie Booze, MD;  Location: [facility];  Service: Cardiovascular;  Laterality: N/A;  . MULTIPLE EXTRACTIONS WITH ALVEOLOPLASTY N/A 08/16/2015   Procedure: EXTRACTION OF TEETH THREE, SIX, EIGHT, NINE, ELEVEN, FOURTEEN, FIFTEEN, TWENTY-EIGHT WITH ALVEOLOPLASTY;  Surgeon: Diona Browner, DDS;  Location: [facility];  Service: Oral Surgery;  Laterality: N/A;  . ROTATOR CUFF REPAIR Right   . TONSILLECTOMY    . TOTAL VAGINAL HYSTERECTOMY    . TUBAL LIGATION      Family History  Adopted: Yes  Problem Relation Age of Onset  . Hypertension Mother   . Bipolar disorder Mother   . Dementia Mother   . Depression Mother   . AAA (abdominal aortic aneurysm) Mother   . Coronary artery disease Father   . Alcohol abuse Father   . Hypertension Brother   . Coronary artery disease Brother   . Bipolar disorder Brother   . Depression Brother   . Depression Sister   . Paranoid behavior Sister   . Cancer Sister        breast  . Bipolar disorder Sister   . Depression Sister   . Hypertension Sister   . Arthritis Sister        knee and hand   . Irregular heart beat Sister        takes eliquis   . Cancer Son        thyroid  . Drug abuse Son   . Alcohol abuse Son   . Cancer Maternal Grandfather        throat   . Cancer Maternal Aunt        breast metastatized to brain  . Anesthesia problems Neg Hx   . Hypotension Neg Hx   . Malignant hyperthermia Neg Hx   . Pseudochol deficiency Neg Hx     Social History Social History   Tobacco Use  . Smoking status: Never Smoker  . Smokeless tobacco: Never Used  Vaping Use  . Vaping Use: Never used  Substance Use Topics  . Alcohol use: No    Alcohol/week: 0.0 standard drinks  . Drug use: No    Allergies  Allergen Reactions  . Abilify [Aripiprazole] Other (See Comments)    hallucinations  . Naproxen Nausea And Vomiting and Swelling  . Ativan [Lorazepam] Other (See Comments)    Delirium  . Haldol [Haloperidol] Other (See Comments)    Hallucinating   . Hydroxyzine Other (See Comments)    hallucinations    Current Outpatient Medications  Medication Sig Dispense Refill  . albuterol (PROAIR HFA) 108 (90 Base) MCG/ACT inhaler USE 2 PUFF EVERY 4 HOURS AS NEEDED FOR WHEEZING OR SHORTNESS OF BREATH 8.5 g 5  . atorvastatin (LIPITOR) 20 MG tablet Take 1  tablet (20 mg total) by mouth daily. 90 tablet 3  . benztropine (COGENTIN) 0.5 MG tablet Take 1 tablet (0.5 mg total) by mouth at bedtime. 30 tablet 2  . budesonide-formoterol (SYMBICORT) 80-4.5 MCG/ACT inhaler Inhale 2 puffs into the lungs in the morning and at bedtime.    . celecoxib (CELEBREX) 200 MG capsule Take 1 capsule (200 mg total) by mouth 2 (two)  times daily. 180 capsule 3  . cetirizine (ZYRTEC) 10 MG tablet TAKE 1 TABLET ONCE DAILY 30 tablet 5  . cloNIDine (CATAPRES) 0.1 MG tablet Take 1 tablet (0.1 mg total) by mouth at bedtime. 90 tablet 3  . diclofenac sodium (VOLTAREN) 1 % GEL Apply to affected area twice daily as needed (Patient taking differently: Apply 2 g topically 2 (two) times daily as needed (PAIN).) 100 g 5  . divalproex (DEPAKOTE) 500 MG DR tablet Take 1 tablet (500 mg total) by mouth 2 (two) times daily. 60 tablet 2  . DULoxetine (CYMBALTA) 60 MG capsule Take 1 capsule (60 mg total) by mouth in the morning. 30 capsule 2  . fluticasone (FLONASE) 50 MCG/ACT nasal spray Place 1 spray 2 (two) times daily as needed into both nostrils for allergies or rhinitis. 16 g 6  . furosemide (LASIX) 20 MG tablet Take 20 mg by mouth as needed. Takes if gaines 3 pounds 30 tablet 3  . levothyroxine (SYNTHROID) 125 MCG tablet Take 1 tablet (125 mcg total) by mouth daily. 90 tablet 3  . linaclotide (LINZESS) 290 MCG CAPS capsule Take 1 capsule (290 mcg total) by mouth daily before breakfast. 30 capsule 3  . lisinopril (ZESTRIL) 5 MG tablet Take 1 tablet (5 mg total) by mouth daily. 90 tablet 3  . naproxen (NAPROSYN) 500 MG tablet Take 1 tablet (500 mg total) by mouth 2 (two) times daily with a meal. 60 tablet 5  . nystatin cream (MYCOSTATIN) APPLY TO THE AFFECTED AREA THREE TIMES DAILY AS NEEDED .Marland KitchenFOR TOPICAL USE ONLY... (Patient taking differently: Apply 1 application topically 3 (three) times daily as needed for dry skin.) 30 g 0  . omeprazole (PRILOSEC) 20 MG capsule Take 1 capsule (20 mg  total) by mouth daily. 90 capsule 3  . OXYGEN Inhale 2 L into the lungs daily.    . risperidone (RISPERDAL) 4 MG tablet Take 1 tablet (4 mg total) by mouth at bedtime. 30 tablet 2  . therapeutic multivitamin-minerals (THERAGRAN-M) tablet Take 1 tablet by mouth daily.    . traZODone (DESYREL) 100 MG tablet Take 1 tablet (100 mg total) by mouth at bedtime. 30 tablet 2  . Vitamin D, Ergocalciferol, (DRISDOL) 1.25 MG (50000 UT) CAPS capsule TAKE 1 CAPSULE EVERY 7 SEVEN DAYS 9 capsule 0   No current facility-administered medications for this visit.     Physical Exam  Last menstrual period 02/18/1995.  Constitutional: overall normal hygiene, normal nutrition, well developed, normal grooming, normal body habitus. Assistive device:none  Musculoskeletal: gait and station Limp left, muscle tone and strength are normal, no tremors or atrophy is present.  .  Neurological: coordination overall normal.  Deep tendon reflex/nerve stretch intact.  Sensation normal.  Cranial nerves II-XII intact.   Skin:   Normal overall no scars, lesions, ulcers or rashes. No psoriasis.  Psychiatric: Alert and oriented x 3.  Recent memory intact, remote memory unclear.  Normal mood and affect. Well groomed.  Good eye contact.  Cardiovascular: overall no swelling, no varicosities, no edema bilaterally, normal temperatures of the legs and arms, no clubbing, cyanosis and good capillary refill.  Lymphatic: palpation is normal.  Left ankle is tender laterally and over anterior talofibular ligament with some swelling.  She has no redness.  NV intact. ROM is full.  All other systems reviewed and are negative   The patient has been educated about the nature of the problem(s) and counseled on treatment options.  The patient appeared to understand what  I have discussed and is in agreement with it.  Encounter Diagnoses  Name Primary?  . Pain in left ankle and joints of left foot Yes  . Body mass index 50.0-59.9, adult  ([place])   . Morbid obesity ([place])     PLAN Call if any problems.  Precautions discussed.  Continue current medications.   Return to clinic 2 weeks   Get CAM walker  Electronically Signed Sanjuana Kava, MD 3/15/20221:44 PM

## 2020-08-10 NOTE — Telephone Encounter (Signed)
Update: patient requests if possible to not have to go to [facility] in [place]. Is there an option of our clinic special ordering through [facility]?

## 2020-08-10 NOTE — Telephone Encounter (Signed)
Call received from [place] at [facility], [phone], relaying that they have received a written prescription for a Cam walker boot for extra wide calf - they do not carry there. States it would be a custom brace, and that [facility] in [place] can provide it there. Please advise if Dr Luna Glasgow would like to have patient go to [facility]. If so, patient would need to take her "face to face" notes, or we can fax referral there.

## 2020-08-11 ENCOUNTER — Ambulatory Visit: Payer: Medicare Other | Admitting: Physical Therapy

## 2020-08-11 NOTE — Telephone Encounter (Signed)
Is this something that we can have done through [facility]?

## 2020-08-11 NOTE — Telephone Encounter (Signed)
Not sure, call or Email Chrys Racer to see [email or web address] or you can call her, I do not have her number, but if you email her, she will let you know, or she will email the phone number.   She ordered the extenders, if you want to try that, they should be in soon.   If you want to use [facility] their number is [phone] the fax number there is [phone]

## 2020-08-11 NOTE — Telephone Encounter (Signed)
Allison Sharp, I think you routed this to me and Dr Luna Glasgow in error. You can contact Chrys Racer with [facility] to find out.

## 2020-08-12 NOTE — Telephone Encounter (Signed)
Patient called this morning.  She asks that you call her please

## 2020-08-12 NOTE — Telephone Encounter (Signed)
Please call her and let her know I am working on her brace issue and after clinic I will be glad to call her.  Thanks

## 2020-08-13 NOTE — Telephone Encounter (Signed)
Patient called back (08/13/20); I called back to relay per Betty's note; aware.

## 2020-08-16 DIAGNOSIS — M533 Sacrococcygeal disorders, not elsewhere classified: Secondary | ICD-10-CM | POA: Diagnosis not present

## 2020-08-23 NOTE — Telephone Encounter (Signed)
Will our extensions help her fit into our CAM walker, if not, then have her come in and I will have a cast applied, the old fashioned method of doing things.

## 2020-08-23 NOTE — Telephone Encounter (Addendum)
Patient called to relay that after last communication, she was given an appointment with [facility] in [place] for her brace, but that it is not till 09/07/20.  Patient does not feel this is satisfactory.  States that she may be "best to go ahead and have surgery." Is there any other option for a custom brace that would be quicker, since [facility] cannot do?  Patient lives in [place].  Is an telephone visit an option if needed, since she has concerns and lives an hour away?

## 2020-08-24 ENCOUNTER — Ambulatory Visit: Payer: Medicare Other | Admitting: Orthopaedic Surgery

## 2020-08-26 ENCOUNTER — Ambulatory Visit (INDEPENDENT_AMBULATORY_CARE_PROVIDER_SITE_OTHER): Payer: Medicare Other | Admitting: Orthopaedic Surgery

## 2020-08-26 ENCOUNTER — Encounter: Payer: Self-pay | Admitting: Orthopaedic Surgery

## 2020-08-26 ENCOUNTER — Other Ambulatory Visit: Payer: Self-pay

## 2020-08-26 VITALS — BP 158/91 | HR 83 | Ht 69.0 in | Wt 345.8 lb

## 2020-08-26 DIAGNOSIS — S93432A Sprain of tibiofibular ligament of left ankle, initial encounter: Secondary | ICD-10-CM

## 2020-08-26 DIAGNOSIS — Z6841 Body Mass Index (BMI) 40.0 and over, adult: Secondary | ICD-10-CM | POA: Diagnosis not present

## 2020-08-26 DIAGNOSIS — M25572 Pain in left ankle and joints of left foot: Secondary | ICD-10-CM | POA: Diagnosis not present

## 2020-08-26 DIAGNOSIS — M79672 Pain in left foot: Secondary | ICD-10-CM

## 2020-08-26 NOTE — Progress Notes (Signed)
My left foot and ankle still hurt  She was unable to find a CAM walker that would fit her. We have tried various sources but cannot find one.  She has been patient waiting and working with [name].   I will place a short leg walking cast on the left ankle and foot, lower leg.  She is agreeable to this. She continues to have pain.  Encounter Diagnoses  Name Primary?  . Pain in left ankle and joints of left foot Yes  . Body mass index 50.0-59.9, adult ([place])   . Morbid obesity ([place])   . Pain in left foot    Short leg walking cast applied.  Return in one week for cast check.  Call if any problem.  Precautions discussed.   Electronically Signed Sanjuana Kava, MD 3/31/202210:55 AM

## 2020-08-27 ENCOUNTER — Telehealth: Payer: Self-pay | Admitting: Orthopaedic Surgery

## 2020-08-27 NOTE — Telephone Encounter (Signed)
Called again, still no answer.  If she wants to come in Monday or Tuesday for new cast can do it for her. Otherwise keep appointment for Thursday   If she calls back let her know.

## 2020-08-27 NOTE — Telephone Encounter (Signed)
Never had a cast just come off before, so I called her to see what is going on. Mailbox not set up yet unable to leave message  No answer.

## 2020-08-27 NOTE — Telephone Encounter (Signed)
Patient called to speak with [name], her cast came off.  I spoke with [name] she said to make appt for Monday.  Patient states she can not come on Monday.  Please call patient back  [phone]

## 2020-09-02 ENCOUNTER — Encounter: Payer: Self-pay | Admitting: Orthopaedic Surgery

## 2020-09-02 ENCOUNTER — Ambulatory Visit: Payer: Medicare Other | Admitting: Orthopaedic Surgery

## 2020-09-02 ENCOUNTER — Other Ambulatory Visit: Payer: Self-pay

## 2020-09-02 VITALS — BP 135/91 | HR 79 | Ht 69.0 in

## 2020-09-02 DIAGNOSIS — M25572 Pain in left ankle and joints of left foot: Secondary | ICD-10-CM

## 2020-09-02 DIAGNOSIS — Z6841 Body Mass Index (BMI) 40.0 and over, adult: Secondary | ICD-10-CM

## 2020-09-02 NOTE — Progress Notes (Signed)
Her cast came off the day after it was applied.  She called but the office was closed.  She waited until today.  I told her she could have been seen earlier.  She still has left ankle pain.  Encounter Diagnoses  Name Primary?  . Pain in left ankle and joints of left foot Yes  . Body mass index 50.0-59.9, adult ([place])   . Morbid obesity ([place])    A new short leg cast applied.  Return in one week.  Call if any problem.  Precautions discussed.   Electronically Signed Sanjuana Kava, MD 4/7/202210:17 AM

## 2020-09-07 ENCOUNTER — Ambulatory Visit: Payer: Medicare Other | Admitting: Orthopaedic Surgery

## 2020-09-08 ENCOUNTER — Telehealth: Payer: Self-pay

## 2020-09-08 NOTE — Telephone Encounter (Signed)
Allison Sharp canceled her appointment for 09/09/20, stating that her cast had came off again and she was going for a second opinion.

## 2020-09-09 ENCOUNTER — Telehealth: Payer: Self-pay

## 2020-09-09 ENCOUNTER — Ambulatory Visit: Payer: Medicare Other | Admitting: Orthopaedic Surgery

## 2020-09-09 NOTE — Telephone Encounter (Signed)
Patient canceled appt for 09/09/20. States she is going for a second opinion, because the cast came off again.   Dr. Luna Glasgow is aware of this.

## 2020-09-13 ENCOUNTER — Telehealth (HOSPITAL_COMMUNITY): Payer: Medicare Other | Admitting: Psychiatry

## 2020-09-20 ENCOUNTER — Telehealth: Payer: Self-pay

## 2020-09-20 DIAGNOSIS — Z9989 Dependence on other enabling machines and devices: Secondary | ICD-10-CM

## 2020-09-20 DIAGNOSIS — G4733 Obstructive sleep apnea (adult) (pediatric): Secondary | ICD-10-CM

## 2020-09-21 NOTE — Telephone Encounter (Signed)
Pt checking back about CPAP supplies

## 2020-09-22 NOTE — Telephone Encounter (Signed)
Faxed order to [facility]  Pt made aware

## 2020-09-22 NOTE — Telephone Encounter (Signed)
Pt needs fax sent to [facility] in [place] for her cpap supplies

## 2020-09-22 NOTE — Telephone Encounter (Signed)
Printed order for CPAP supplies, we can fax it for her

## 2020-09-27 ENCOUNTER — Telehealth: Payer: Self-pay

## 2020-09-27 NOTE — Telephone Encounter (Signed)
  Prescription Request  09/27/2020  What is the name of the medication or equipment? Pt needs new c-pap machine  Have you contacted your pharmacy to request a refill? (if applicable) yes  Which pharmacy would you like this sent to? [facility]   Patient notified that their request is being sent to the clinical staff for review and that they should receive a response within 2 business days.

## 2020-09-27 NOTE — Telephone Encounter (Signed)
Pt said [employer] said she was eligible for a new machine. I was wanting to make her an appointment with Dr. Warrick Parisian because there are no notes in her last couple of appointments about her diagnosis or use & benefit of her C-PAP machine, she said that was ok, she was changing to a new doctor on the 10th and would just get them to do it and I was disconnected before I could explore the topic anymroe

## 2020-09-28 DIAGNOSIS — M25572 Pain in left ankle and joints of left foot: Secondary | ICD-10-CM | POA: Diagnosis not present

## 2020-10-05 ENCOUNTER — Ambulatory Visit: Payer: Medicare Other | Admitting: Internal Medicine

## 2020-10-05 DIAGNOSIS — G4733 Obstructive sleep apnea (adult) (pediatric): Secondary | ICD-10-CM | POA: Diagnosis not present

## 2020-10-05 DIAGNOSIS — J449 Chronic obstructive pulmonary disease, unspecified: Secondary | ICD-10-CM | POA: Diagnosis not present

## 2020-10-12 ENCOUNTER — Ambulatory Visit: Payer: Medicare Other | Admitting: Physical Therapy

## 2020-11-04 DIAGNOSIS — J029 Acute pharyngitis, unspecified: Secondary | ICD-10-CM | POA: Diagnosis not present

## 2020-11-04 DIAGNOSIS — R52 Pain, unspecified: Secondary | ICD-10-CM | POA: Diagnosis not present

## 2020-11-04 DIAGNOSIS — J329 Chronic sinusitis, unspecified: Secondary | ICD-10-CM | POA: Diagnosis not present

## 2020-11-04 DIAGNOSIS — K120 Recurrent oral aphthae: Secondary | ICD-10-CM | POA: Diagnosis not present

## 2020-11-04 DIAGNOSIS — R5383 Other fatigue: Secondary | ICD-10-CM | POA: Diagnosis not present

## 2020-11-09 ENCOUNTER — Encounter (HOSPITAL_COMMUNITY): Payer: Self-pay | Admitting: Psychiatry

## 2020-11-09 ENCOUNTER — Other Ambulatory Visit (HOSPITAL_COMMUNITY): Payer: Self-pay | Admitting: *Deleted

## 2020-11-09 ENCOUNTER — Other Ambulatory Visit: Payer: Self-pay

## 2020-11-09 ENCOUNTER — Telehealth (INDEPENDENT_AMBULATORY_CARE_PROVIDER_SITE_OTHER): Payer: Medicare Other | Admitting: Psychiatry

## 2020-11-09 VITALS — Wt 345.0 lb

## 2020-11-09 DIAGNOSIS — F319 Bipolar disorder, unspecified: Secondary | ICD-10-CM

## 2020-11-09 DIAGNOSIS — G3184 Mild cognitive impairment, so stated: Secondary | ICD-10-CM | POA: Diagnosis not present

## 2020-11-09 DIAGNOSIS — F411 Generalized anxiety disorder: Secondary | ICD-10-CM | POA: Diagnosis not present

## 2020-11-09 DIAGNOSIS — F3160 Bipolar disorder, current episode mixed, unspecified: Secondary | ICD-10-CM

## 2020-11-09 MED ORDER — DULOXETINE HCL 60 MG PO CPEP
60.0000 mg | ORAL_CAPSULE | Freq: Every morning | ORAL | 2 refills | Status: DC
Start: 2020-11-09 — End: 2021-01-27

## 2020-11-09 MED ORDER — TRAZODONE HCL 100 MG PO TABS: 100.0000 mg | ORAL_TABLET | Freq: Every day | ORAL | 2 refills | Status: DC

## 2020-11-09 MED ORDER — DIVALPROEX SODIUM 500 MG PO DR TAB: 500.0000 mg | DELAYED_RELEASE_TABLET | Freq: Two times a day (BID) | ORAL | 2 refills | Status: DC

## 2020-11-09 MED ORDER — RISPERIDONE 4 MG PO TABS: 4.0000 mg | ORAL_TABLET | Freq: Every day | ORAL | 2 refills | Status: DC

## 2020-11-09 MED ORDER — BENZTROPINE MESYLATE 0.5 MG PO TABS: 0.5000 mg | ORAL_TABLET | Freq: Every day | ORAL | 2 refills | Status: DC

## 2020-11-09 NOTE — Progress Notes (Signed)
Virtual Visit via Telephone Note  I connected with Allison Sharp on 11/09/20 at  2:20 PM EDT by telephone and verified that I am speaking with the correct person using two identifiers.  Location: Patient: Home Provider: Home Office   I discussed the limitations, risks, security and privacy concerns of performing an evaluation and management service by telephone and the availability of in person appointments. I also discussed with the patient that there may be a patient responsible charge related to this service. The patient expressed understanding and agreed to proceed.   History of Present Illness: Patient is evaluated by phone session.  She is taking all her medication as prescribed.  She admitted sometimes difficulty sleeping but does not feel this happen every night.  She is more active and feel have more energy.  She is living with her sister and so far things are going well.  She forgot to see the neurologist and had not done Depakote level which was recommended.  She has chronic memory issues.  She denies any hallucination, mania, psychosis, impulsive behavior.  Her appetite is okay and her weight is stable.  Recently she was seen in the urgent care because of headaches but now she is feeling better.   Past Psychiatric History: Reviewed. H/O psychiatric illness with multiple inpatient treatment.  Last admissions in May 2020 at [facility]. H/O suicidal attempt by taking overdose.  Diagnosed with PTSD, depression, bipolar disorder.  Tried Paxil, Prozac, Wellbutrin, Effexor, Lexapro, amitriptyline, Cymbalta, Abilify (Tremors), Neurontin, trazodone, Thorazine, hydroxyzine, Luvox, Sinequan, Zyprexa and Latuda.  Best responded on Risperdal and Cymbalta.    Psychiatric Specialty Exam: Physical Exam  Review of Systems  Weight (!) 345 lb (156.5 kg), last menstrual period 02/18/1995.There is no height or weight on file to calculate BMI.  General Appearance: NA  Eye Contact:  NA   Speech:  Slow  Volume:  Decreased  Mood:  Euthymic  Affect:  NA  Thought Process:  Descriptions of Associations: Intact  Orientation:  Full (Time, Place, and Person)  Thought Content:  WDL  Suicidal Thoughts:  No  Homicidal Thoughts:  No  Memory:  Immediate;   Fair Recent;   Fair Remote;   Fair  Judgement:  Fair  Insight:  Shallow  Psychomotor Activity:  NA  Concentration:  Concentration: Fair and Attention Span: Fair  Recall:  [employer] of Knowledge:  Fair  Language:  Fair  Akathisia:  No  Handed:  Right  AIMS (if indicated):     Assets:  Communication Skills Desire for Improvement Housing Social Support  ADL's:  Intact  Cognition:  Impaired,  Mild  Sleep:   fair      Assessment and Plan: Bipolar disorder type I.  Generalized anxiety disorder.  Mild cognitive impairment.  Reminded to have a Depakote level.  Discussed sleep hygiene and recommend to avoid watching late TV.  Recommend walking every day.  I also suggest she should call the neurologist to schedule appointment and if she has difficulty getting appointment that she should call us back.  Continue trazodone 100 mg at bedtime, Depakote 500 mg twice a day, Cymbalta 60 mg daily, Cogentin 0.5 mg at bedtime and Risperdal 4 mg at bedtime recommended to call us back if she has any question or any concern.  Follow-up in 3 months.  Follow Up Instructions:    I discussed the assessment and treatment plan with the patient. The patient was provided an opportunity to ask questions and all were answered.  The patient agreed with the plan and demonstrated an understanding of the instructions.   The patient was advised to call back or seek an in-person evaluation if the symptoms worsen or if the condition fails to improve as anticipated.  I provided 17 minutes of non-face-to-face time during this encounter.   Kathlee Nations, MD

## 2020-11-10 ENCOUNTER — Telehealth (HOSPITAL_COMMUNITY): Payer: Self-pay | Admitting: *Deleted

## 2020-11-10 NOTE — Telephone Encounter (Signed)
Spoke with pt to advise that lab orders have been sent to [facility]. Pt verbalizes understanding.

## 2020-12-14 DIAGNOSIS — R404 Transient alteration of awareness: Secondary | ICD-10-CM | POA: Diagnosis not present

## 2020-12-14 DIAGNOSIS — M25561 Pain in right knee: Secondary | ICD-10-CM | POA: Diagnosis not present

## 2020-12-14 DIAGNOSIS — R52 Pain, unspecified: Secondary | ICD-10-CM | POA: Diagnosis not present

## 2020-12-14 DIAGNOSIS — R262 Difficulty in walking, not elsewhere classified: Secondary | ICD-10-CM | POA: Diagnosis not present

## 2020-12-14 DIAGNOSIS — E860 Dehydration: Secondary | ICD-10-CM | POA: Diagnosis not present

## 2020-12-14 DIAGNOSIS — M4802 Spinal stenosis, cervical region: Secondary | ICD-10-CM | POA: Diagnosis not present

## 2020-12-14 DIAGNOSIS — E079 Disorder of thyroid, unspecified: Secondary | ICD-10-CM | POA: Diagnosis not present

## 2020-12-14 DIAGNOSIS — M47812 Spondylosis without myelopathy or radiculopathy, cervical region: Secondary | ICD-10-CM | POA: Diagnosis not present

## 2020-12-14 DIAGNOSIS — S139XXA Sprain of joints and ligaments of unspecified parts of neck, initial encounter: Secondary | ICD-10-CM | POA: Diagnosis not present

## 2020-12-14 DIAGNOSIS — R4020 Unspecified coma: Secondary | ICD-10-CM | POA: Diagnosis not present

## 2020-12-14 DIAGNOSIS — I6782 Cerebral ischemia: Secondary | ICD-10-CM | POA: Diagnosis not present

## 2020-12-14 DIAGNOSIS — Z743 Need for continuous supervision: Secondary | ICD-10-CM | POA: Diagnosis not present

## 2020-12-14 DIAGNOSIS — R55 Syncope and collapse: Secondary | ICD-10-CM | POA: Diagnosis not present

## 2020-12-14 DIAGNOSIS — Z9181 History of falling: Secondary | ICD-10-CM | POA: Diagnosis not present

## 2020-12-14 DIAGNOSIS — S161XXA Strain of muscle, fascia and tendon at neck level, initial encounter: Secondary | ICD-10-CM | POA: Diagnosis not present

## 2020-12-14 DIAGNOSIS — R42 Dizziness and giddiness: Secondary | ICD-10-CM | POA: Diagnosis not present

## 2020-12-14 DIAGNOSIS — Z79899 Other long term (current) drug therapy: Secondary | ICD-10-CM | POA: Diagnosis not present

## 2020-12-14 DIAGNOSIS — S0003XA Contusion of scalp, initial encounter: Secondary | ICD-10-CM | POA: Diagnosis not present

## 2020-12-14 DIAGNOSIS — S199XXA Unspecified injury of neck, initial encounter: Secondary | ICD-10-CM | POA: Diagnosis not present

## 2020-12-14 DIAGNOSIS — M25562 Pain in left knee: Secondary | ICD-10-CM | POA: Diagnosis not present

## 2020-12-14 DIAGNOSIS — R9431 Abnormal electrocardiogram [ECG] [EKG]: Secondary | ICD-10-CM | POA: Diagnosis not present

## 2020-12-14 DIAGNOSIS — Z7409 Other reduced mobility: Secondary | ICD-10-CM | POA: Diagnosis not present

## 2020-12-14 DIAGNOSIS — R002 Palpitations: Secondary | ICD-10-CM | POA: Insufficient documentation

## 2020-12-14 DIAGNOSIS — R2689 Other abnormalities of gait and mobility: Secondary | ICD-10-CM | POA: Diagnosis not present

## 2020-12-14 DIAGNOSIS — R519 Headache, unspecified: Secondary | ICD-10-CM | POA: Diagnosis not present

## 2020-12-14 DIAGNOSIS — E785 Hyperlipidemia, unspecified: Secondary | ICD-10-CM | POA: Diagnosis not present

## 2020-12-14 DIAGNOSIS — R0689 Other abnormalities of breathing: Secondary | ICD-10-CM | POA: Diagnosis not present

## 2020-12-14 DIAGNOSIS — W1839XA Other fall on same level, initial encounter: Secondary | ICD-10-CM | POA: Diagnosis not present

## 2020-12-14 DIAGNOSIS — I951 Orthostatic hypotension: Secondary | ICD-10-CM | POA: Diagnosis not present

## 2020-12-14 DIAGNOSIS — I10 Essential (primary) hypertension: Secondary | ICD-10-CM | POA: Diagnosis not present

## 2020-12-14 DIAGNOSIS — Z20822 Contact with and (suspected) exposure to covid-19: Secondary | ICD-10-CM | POA: Diagnosis not present

## 2020-12-14 DIAGNOSIS — M542 Cervicalgia: Secondary | ICD-10-CM | POA: Diagnosis not present

## 2020-12-15 DIAGNOSIS — I6523 Occlusion and stenosis of bilateral carotid arteries: Secondary | ICD-10-CM | POA: Diagnosis not present

## 2020-12-15 DIAGNOSIS — I4891 Unspecified atrial fibrillation: Secondary | ICD-10-CM | POA: Diagnosis not present

## 2020-12-15 DIAGNOSIS — I5189 Other ill-defined heart diseases: Secondary | ICD-10-CM | POA: Diagnosis not present

## 2020-12-15 DIAGNOSIS — R55 Syncope and collapse: Secondary | ICD-10-CM | POA: Diagnosis not present

## 2020-12-15 DIAGNOSIS — I517 Cardiomegaly: Secondary | ICD-10-CM | POA: Diagnosis not present

## 2020-12-15 DIAGNOSIS — I951 Orthostatic hypotension: Secondary | ICD-10-CM | POA: Diagnosis not present

## 2020-12-17 ENCOUNTER — Other Ambulatory Visit: Payer: Self-pay | Admitting: Family Medicine

## 2020-12-17 ENCOUNTER — Telehealth: Payer: Self-pay

## 2020-12-17 DIAGNOSIS — I5030 Unspecified diastolic (congestive) heart failure: Secondary | ICD-10-CM | POA: Insufficient documentation

## 2020-12-17 DIAGNOSIS — Z9229 Personal history of other drug therapy: Secondary | ICD-10-CM | POA: Insufficient documentation

## 2020-12-17 DIAGNOSIS — Z8719 Personal history of other diseases of the digestive system: Secondary | ICD-10-CM | POA: Insufficient documentation

## 2020-12-17 DIAGNOSIS — D693 Immune thrombocytopenic purpura: Secondary | ICD-10-CM | POA: Insufficient documentation

## 2020-12-17 DIAGNOSIS — S0003XA Contusion of scalp, initial encounter: Secondary | ICD-10-CM | POA: Insufficient documentation

## 2020-12-17 NOTE — Telephone Encounter (Signed)
Transition Care Management Follow-up Telephone Call Date of discharge and from where: 12/16/20 - [facility] Diagnosis: Syncope How have you been since you were released from the hospital? Feeling okay overall, weak, tired, headache Any questions or concerns? No  Items Reviewed: Did the pt receive and understand the discharge instructions provided? Yes  Medications obtained and verified? Yes  Other? No  Any new allergies since your discharge? No  Dietary orders reviewed? Yes Do you have support at home? Yes   Home Care and Equipment/Supplies: Were home health services ordered? no If so, what is the name of the agency? N/a  Has the agency set up a time to come to the patient's home? not applicable Were any new equipment or medical supplies ordered?  No What is the name of the medical supply agency? N/a Were you able to get the supplies/equipment? not applicable Do you have any questions related to the use of the equipment or supplies? No  Functional Questionnaire: (I = Independent and D = Dependent) ADLs: I  Bathing/Dressing- I  Meal Prep- D  Eating- I  Maintaining continence- I  Transferring/Ambulation- I WITH CANE  Managing Meds- I  Follow up appointments reviewed:  PCP Hospital f/u appt confirmed?  Yes - refused to see another provider and soonest Dr Dettinger has is 85/10, so this will not be TCM, just regular hospital f/u.  Scheduled to see Dettinger on 8/10 @ 2:55. [facility] f/u appt confirmed? No   Are transportation arrangements needed? No  If their condition worsens, is the pt aware to call PCP or go to the Emergency Dept.? Yes Was the patient provided with contact information for the PCP's office or ED? Yes Was to pt encouraged to call back with questions or concerns? Yes

## 2020-12-22 ENCOUNTER — Ambulatory Visit: Payer: Medicare Other | Admitting: Internal Medicine

## 2020-12-27 DIAGNOSIS — Z87898 Personal history of other specified conditions: Secondary | ICD-10-CM | POA: Diagnosis not present

## 2020-12-27 DIAGNOSIS — I472 Ventricular tachycardia: Secondary | ICD-10-CM | POA: Diagnosis not present

## 2020-12-28 ENCOUNTER — Ambulatory Visit: Payer: Medicare Other | Admitting: Nurse Practitioner

## 2020-12-28 ENCOUNTER — Encounter: Payer: Self-pay | Admitting: Family Medicine

## 2020-12-29 DIAGNOSIS — R262 Difficulty in walking, not elsewhere classified: Secondary | ICD-10-CM | POA: Diagnosis not present

## 2020-12-29 DIAGNOSIS — I10 Essential (primary) hypertension: Secondary | ICD-10-CM | POA: Diagnosis not present

## 2020-12-29 DIAGNOSIS — I499 Cardiac arrhythmia, unspecified: Secondary | ICD-10-CM | POA: Diagnosis not present

## 2020-12-29 DIAGNOSIS — M25561 Pain in right knee: Secondary | ICD-10-CM | POA: Diagnosis not present

## 2020-12-29 DIAGNOSIS — Z96651 Presence of right artificial knee joint: Secondary | ICD-10-CM | POA: Diagnosis not present

## 2021-01-03 DIAGNOSIS — R55 Syncope and collapse: Secondary | ICD-10-CM | POA: Diagnosis not present

## 2021-01-05 ENCOUNTER — Inpatient Hospital Stay: Payer: Medicare Other | Admitting: Family Medicine

## 2021-01-06 DIAGNOSIS — J449 Chronic obstructive pulmonary disease, unspecified: Secondary | ICD-10-CM | POA: Diagnosis not present

## 2021-01-06 DIAGNOSIS — G4733 Obstructive sleep apnea (adult) (pediatric): Secondary | ICD-10-CM | POA: Diagnosis not present

## 2021-01-17 ENCOUNTER — Other Ambulatory Visit: Payer: Self-pay | Admitting: Family Medicine

## 2021-01-21 ENCOUNTER — Other Ambulatory Visit (HOSPITAL_COMMUNITY): Payer: Self-pay | Admitting: Psychiatry

## 2021-01-21 DIAGNOSIS — F3160 Bipolar disorder, current episode mixed, unspecified: Secondary | ICD-10-CM

## 2021-01-25 DIAGNOSIS — R0600 Dyspnea, unspecified: Secondary | ICD-10-CM | POA: Diagnosis not present

## 2021-01-25 DIAGNOSIS — R0789 Other chest pain: Secondary | ICD-10-CM | POA: Diagnosis not present

## 2021-01-25 DIAGNOSIS — I480 Paroxysmal atrial fibrillation: Secondary | ICD-10-CM | POA: Diagnosis not present

## 2021-01-25 DIAGNOSIS — Z8249 Family history of ischemic heart disease and other diseases of the circulatory system: Secondary | ICD-10-CM | POA: Diagnosis not present

## 2021-01-25 DIAGNOSIS — I10 Essential (primary) hypertension: Secondary | ICD-10-CM | POA: Diagnosis not present

## 2021-01-25 DIAGNOSIS — R002 Palpitations: Secondary | ICD-10-CM | POA: Diagnosis not present

## 2021-01-25 DIAGNOSIS — E782 Mixed hyperlipidemia: Secondary | ICD-10-CM | POA: Diagnosis not present

## 2021-01-27 ENCOUNTER — Other Ambulatory Visit (HOSPITAL_COMMUNITY): Payer: Self-pay | Admitting: *Deleted

## 2021-01-27 DIAGNOSIS — F3160 Bipolar disorder, current episode mixed, unspecified: Secondary | ICD-10-CM

## 2021-01-27 MED ORDER — DULOXETINE HCL 60 MG PO CPEP: 60.0000 mg | ORAL_CAPSULE | Freq: Every morning | ORAL | 0 refills | Status: DC

## 2021-01-28 ENCOUNTER — Other Ambulatory Visit (HOSPITAL_COMMUNITY): Payer: Self-pay | Admitting: Psychiatry

## 2021-01-28 DIAGNOSIS — F411 Generalized anxiety disorder: Secondary | ICD-10-CM

## 2021-01-28 DIAGNOSIS — F3160 Bipolar disorder, current episode mixed, unspecified: Secondary | ICD-10-CM

## 2021-02-09 ENCOUNTER — Telehealth (HOSPITAL_COMMUNITY): Payer: Medicare Other | Admitting: Psychiatry

## 2021-02-10 ENCOUNTER — Telehealth (INDEPENDENT_AMBULATORY_CARE_PROVIDER_SITE_OTHER): Payer: Medicare Other | Admitting: Psychiatry

## 2021-02-10 ENCOUNTER — Encounter (HOSPITAL_COMMUNITY): Payer: Self-pay | Admitting: Psychiatry

## 2021-02-10 ENCOUNTER — Other Ambulatory Visit: Payer: Self-pay

## 2021-02-10 DIAGNOSIS — F411 Generalized anxiety disorder: Secondary | ICD-10-CM

## 2021-02-10 DIAGNOSIS — F3160 Bipolar disorder, current episode mixed, unspecified: Secondary | ICD-10-CM

## 2021-02-10 MED ORDER — DULOXETINE HCL 60 MG PO CPEP: 60.0000 mg | ORAL_CAPSULE | Freq: Every morning | ORAL | 2 refills | Status: DC

## 2021-02-10 MED ORDER — RISPERIDONE 4 MG PO TABS: 4.0000 mg | ORAL_TABLET | Freq: Every day | ORAL | 2 refills | Status: DC

## 2021-02-10 MED ORDER — TRAZODONE HCL 50 MG PO TABS: 50.0000 mg | ORAL_TABLET | Freq: Every day | ORAL | 2 refills | Status: DC

## 2021-02-10 MED ORDER — DIVALPROEX SODIUM 500 MG PO DR TAB: 500.0000 mg | DELAYED_RELEASE_TABLET | Freq: Two times a day (BID) | ORAL | 2 refills | Status: DC

## 2021-02-10 NOTE — Progress Notes (Signed)
Virtual Visit via Telephone Note  I connected with Allison Sharp on 02/10/21 at 10:40 AM EDT by telephone and verified that I am speaking with the correct person using two identifiers.  Location: Patient: Home Provider: Office   I discussed the limitations, risks, security and privacy concerns of performing an evaluation and management service by telephone and the availability of in person appointments. I also discussed with the patient that there may be a patient responsible charge related to this service. The patient expressed understanding and agreed to proceed.   History of Present Illness: Patient is evaluated by phone session.  She was in the hospital for syncopal episode and she stayed in the hospital for a few days.  She had orthostatic hypotension and her blood pressure medicines were discontinued.  She is also not taking clonidine and Cogentin.  She is not taking metoprolol and Eliquis.  She is scheduled to see a cardiologist Dr. Sherlynn Stalls in [place].  She endorsed since her medicine has been cut down she is not sleeping very well.  She again forgot to do the Depakote level which was recommended in the last visit.  Patient has chronic memory issues and she is sometimes forgetful.  She wants to try a Qvivq for sleep however discussing the side effects possible hallucination patient decided not to pursue.  She is also on trazodone but lately not taking as she forgot.  Patient denies any paranoia, anger, suicidal thoughts.  She lives with her sister is very supportive.  Patient denies any impulsive behavior and she feels rested all Depakote and Cymbalta seems to be working very well.  Her appetite is okay.  Her weight is stable.  She does not drive and usually stays home.  Past Psychiatric History: Reviewed. H/O psychiatric illness with multiple inpatient treatment.  Last admissions in May 2020 at [facility]. H/O suicidal attempt by taking overdose.  Diagnosed with PTSD, depression,  bipolar disorder.  Tried Paxil, Prozac, Wellbutrin, Effexor, Lexapro, amitriptyline, Cymbalta, Abilify (Tremors), Neurontin, trazodone, Thorazine, hydroxyzine, Luvox, Sinequan, Zyprexa and Latuda.  Best responded on Risperdal and Cymbalta.    No results found for this or any previous visit (from the past 2160 hour(s)).   Psychiatric Specialty Exam: Physical Exam  Review of Systems  Weight (!) 347 lb (157.4 kg), last menstrual period 02/18/1995.There is no height or weight on file to calculate BMI.  General Appearance: NA  Eye Contact:  NA  Speech:  Slow  Volume:  Decreased  Mood:  Anxious  Affect:  NA  Thought Process:  Descriptions of Associations: Intact  Orientation:  Full (Time, Place, and Person)  Thought Content:  Rumination  Suicidal Thoughts:  No  Homicidal Thoughts:  No  Memory:  Immediate;   Fair Recent;   Fair Remote;   Fair  Judgement:  Fair  Insight:  Shallow  Psychomotor Activity:  NA  Concentration:  Concentration: Fair and Attention Span: Fair  Recall:  [employer] of Knowledge:  Fair  Language:  Fair  Akathisia:  No  Handed:  Right  AIMS (if indicated):     Assets:  Communication Skills Desire for Improvement Housing Social Support  ADL's:  Intact  Cognition:  Impaired,  Mild  Sleep:   fair      Assessment and Plan: Bipolar disorder type I.  Generalized anxiety disorder.  Mild cognitive impairment.  I reviewed blood work results from recent hospitalization.  Her lisinopril, clonidine, Cogentin, tramadol and hydrochlorothiazide has been discontinued as patient having orthostatic  hypotension.  She is now on metoprolol and Eliquis.  She is supposed to see a cardiologist in few weeks.  We talk about current medication and I recommend she can go back on trazodone but take only 50 mg at bedtime.  Encouraged to have a Depakote level which is due.  For now continue Depakote 500 mg twice a day, continue Cymbalta 60 mg daily, Risperdal 4 mg at bedtime.  If the  trazodone does not help and she can try over-the-counter melatonin but she need to be very careful about her blood pressure.  Encouraged to monitor her blood pressure regularly.  Follow up in 3 months.  Follow Up Instructions:    I discussed the assessment and treatment plan with the patient. The patient was provided an opportunity to ask questions and all were answered. The patient agreed with the plan and demonstrated an understanding of the instructions.   The patient was advised to call back or seek an in-person evaluation if the symptoms worsen or if the condition fails to improve as anticipated.  I provided 26 minutes of non-face-to-face time during this encounter.   Allison Nations, MD

## 2021-02-18 ENCOUNTER — Other Ambulatory Visit (HOSPITAL_COMMUNITY): Payer: Self-pay | Admitting: Psychiatry

## 2021-02-18 DIAGNOSIS — F3160 Bipolar disorder, current episode mixed, unspecified: Secondary | ICD-10-CM

## 2021-02-21 DIAGNOSIS — E039 Hypothyroidism, unspecified: Secondary | ICD-10-CM | POA: Diagnosis not present

## 2021-02-21 DIAGNOSIS — E785 Hyperlipidemia, unspecified: Secondary | ICD-10-CM | POA: Diagnosis not present

## 2021-02-21 DIAGNOSIS — N390 Urinary tract infection, site not specified: Secondary | ICD-10-CM | POA: Diagnosis not present

## 2021-02-21 DIAGNOSIS — I10 Essential (primary) hypertension: Secondary | ICD-10-CM | POA: Diagnosis not present

## 2021-02-24 ENCOUNTER — Other Ambulatory Visit (HOSPITAL_COMMUNITY): Payer: Self-pay | Admitting: Psychiatry

## 2021-02-24 DIAGNOSIS — F3160 Bipolar disorder, current episode mixed, unspecified: Secondary | ICD-10-CM

## 2021-03-03 ENCOUNTER — Telehealth (HOSPITAL_COMMUNITY): Payer: Self-pay | Admitting: *Deleted

## 2021-03-03 NOTE — Telephone Encounter (Signed)
Lab results received from [facility]. Most are WNL with the exception of Triglycerides high @291 , VLDL Cholesterol high @ 47, BUN low @ 7, and BUN/Creatinine low @ 8. Results to be scanned to chart.

## 2021-03-09 ENCOUNTER — Other Ambulatory Visit: Payer: Self-pay | Admitting: Family Medicine

## 2021-03-11 ENCOUNTER — Other Ambulatory Visit: Payer: Self-pay | Admitting: Family Medicine

## 2021-03-25 ENCOUNTER — Other Ambulatory Visit: Payer: Self-pay | Admitting: Family Medicine

## 2021-03-28 NOTE — Telephone Encounter (Signed)
Dettinger. NTBS 20 days given 03/09/21

## 2021-03-29 ENCOUNTER — Ambulatory Visit: Payer: Medicare Other | Admitting: Internal Medicine

## 2021-04-07 ENCOUNTER — Other Ambulatory Visit: Payer: Self-pay | Admitting: Family Medicine

## 2021-04-08 ENCOUNTER — Other Ambulatory Visit (HOSPITAL_COMMUNITY): Payer: Self-pay | Admitting: Psychiatry

## 2021-04-08 DIAGNOSIS — F3160 Bipolar disorder, current episode mixed, unspecified: Secondary | ICD-10-CM

## 2021-04-08 DIAGNOSIS — F411 Generalized anxiety disorder: Secondary | ICD-10-CM

## 2021-04-12 ENCOUNTER — Other Ambulatory Visit: Payer: Self-pay | Admitting: Cardiology

## 2021-04-12 ENCOUNTER — Other Ambulatory Visit (HOSPITAL_COMMUNITY): Payer: Self-pay | Admitting: Cardiology

## 2021-05-02 ENCOUNTER — Telehealth (HOSPITAL_BASED_OUTPATIENT_CLINIC_OR_DEPARTMENT_OTHER): Payer: Medicare Other | Admitting: Psychiatry

## 2021-05-02 ENCOUNTER — Other Ambulatory Visit: Payer: Self-pay

## 2021-05-02 ENCOUNTER — Encounter (HOSPITAL_COMMUNITY): Payer: Self-pay | Admitting: Psychiatry

## 2021-05-02 VITALS — Wt 335.0 lb

## 2021-05-02 DIAGNOSIS — F3160 Bipolar disorder, current episode mixed, unspecified: Secondary | ICD-10-CM

## 2021-05-02 DIAGNOSIS — F411 Generalized anxiety disorder: Secondary | ICD-10-CM

## 2021-05-02 DIAGNOSIS — F5105 Insomnia due to other mental disorder: Secondary | ICD-10-CM

## 2021-05-02 MED ORDER — ZOLPIDEM TARTRATE 5 MG PO TABS: 5.0000 mg | ORAL_TABLET | Freq: Every evening | ORAL | 0 refills | Status: DC | PRN

## 2021-05-02 MED ORDER — DIVALPROEX SODIUM 500 MG PO DR TAB
1000.0000 mg | DELAYED_RELEASE_TABLET | Freq: Every day | ORAL | 2 refills | Status: DC
Start: 2021-05-02 — End: 2021-07-28

## 2021-05-02 MED ORDER — RISPERIDONE 4 MG PO TABS: 4.0000 mg | ORAL_TABLET | Freq: Every day | ORAL | 2 refills | Status: DC

## 2021-05-02 MED ORDER — DULOXETINE HCL 60 MG PO CPEP: 60.0000 mg | ORAL_CAPSULE | Freq: Every morning | ORAL | 2 refills | Status: DC

## 2021-05-02 NOTE — Progress Notes (Signed)
Virtual Visit via Telephone Note  I connected with Allison Sharp on 05/02/21 at  9:20 AM EST by telephone and verified that I am speaking with the correct person using two identifiers.  Location: Patient: Home  Provider: Home Office   I discussed the limitations, risks, security and privacy concerns of performing an evaluation and management service by telephone and the availability of in person appointments. I also discussed with the patient that there may be a patient responsible charge related to this service. The patient expressed understanding and agreed to proceed.   History of Present Illness: Patient is evaluated by phone session.  She reported her mood is stable but she is still struggling with insomnia.  She tried trazodone but she is only getting 2 to 3 hours of sleep.  Recently she had a visit with her PCP and had blood work.  Her triglycerides were high but we do not receive Depakote level.  She reported her Thanksgiving was very good because her friend came and they have a nice gathering.  Patient told she may go to [place] for a vacation and to visit her sister who lives there.  Patient denies any dizziness, fall, syncopal episode.  She like to go back on Ambien to help her sleep.  She feels her anxiety is much better and she does go outside and try to walk.  She had lost weight since the last visit because she feels more active.  She has no tremor or shakes or any EPS.  She denies any anger and she is getting along with her sister much better.  However she has chronic memory issues and sometimes she has difficulty remembering things.  She endorsed her short-term memory is not as good but able to remember old things.  She used to go with her sister outside for groceries.  Past Psychiatric History: Reviewed. H/O psychiatric illness with multiple inpatient treatment.  Last admissions in May 2020 at [facility]. H/O suicidal attempt by taking overdose.  Diagnosed with PTSD,  depression, bipolar disorder.  Tried Paxil, Prozac, Wellbutrin, Effexor, Lexapro, amitriptyline, Cymbalta, Abilify (Tremors), Neurontin, trazodone, Thorazine, hydroxyzine, Luvox, Sinequan, Zyprexa and Latuda.  Best responded on Risperdal and Cymbalta.     Psychiatric Specialty Exam: Physical Exam  Review of Systems  Weight (!) 335 lb (152 kg), last menstrual period 02/18/1995.There is no height or weight on file to calculate BMI.  General Appearance: NA  Eye Contact:  NA  Speech:  Slow  Volume:  Normal  Mood:  Anxious  Affect:  NA  Thought Process:  Goal Directed  Orientation:  Full (Time, Place, and Person)  Thought Content:  Logical  Suicidal Thoughts:  No  Homicidal Thoughts:  No  Memory:  Immediate;   Fair Recent;   Fair Remote;   Fair  Judgement:  Intact  Insight:  Present  Psychomotor Activity:  NA  Concentration:  Concentration: Fair and Attention Span: Fair  Recall:  [employer] of Knowledge:  Good  Language:  Good  Akathisia:  No  Handed:  Right  AIMS (if indicated):     Assets:  Communication Skills Desire for Improvement Housing Resilience Social Support  ADL's:  Intact  Cognition:  WNL  Sleep:   2-3 hrs      Assessment and Plan: Bipolar disorder type I.  Generalized anxiety disorder.  Insomnia due to mental disorder.  I reviewed blood work results however we do not have a Depakote level.  I recommend if she is not  sleeping well then she can take a total dose of Depakote 1000 mg at bedtime.  Currently she is taking 500 mg twice a day.  She insists to try Ambien which had helped her in the past.  I recommend if changing the time of the Depakote did not help and she can take the Ambien 5 mg as needed for insomnia.  She will discontinue trazodone.  I also encouraged she should contact her physician for Depakote level since we do not receive it.  Patient promised to give them a call.  Continue Risperdal 4 mg at bedtime, Cymbalta 60 mg daily and she will take the  Depakote 1000 mg at bedtime.  We will only provide Ambien to take as needed and she will call us back if she needs more refills.  Follow-up in 3 months.  I recommend if she continues to have issues with her memory then she should consider seeing a neurologist.  Follow Up Instructions:    I discussed the assessment and treatment plan with the patient. The patient was provided an opportunity to ask questions and all were answered. The patient agreed with the plan and demonstrated an understanding of the instructions.   The patient was advised to call back or seek an in-person evaluation if the symptoms worsen or if the condition fails to improve as anticipated.  I provided 32 minutes of non-face-to-face time during this encounter.   Kathlee Nations, MD

## 2021-05-04 ENCOUNTER — Other Ambulatory Visit: Payer: Self-pay | Admitting: Family Medicine

## 2021-05-06 ENCOUNTER — Ambulatory Visit: Payer: Medicare Other | Admitting: Cardiology

## 2021-05-11 ENCOUNTER — Telehealth (HOSPITAL_COMMUNITY): Payer: Medicare Other | Admitting: Psychiatry

## 2021-05-25 ENCOUNTER — Telehealth (HOSPITAL_COMMUNITY): Payer: Self-pay | Admitting: *Deleted

## 2021-05-25 DIAGNOSIS — F5105 Insomnia due to other mental disorder: Secondary | ICD-10-CM

## 2021-05-25 NOTE — Telephone Encounter (Signed)
Writer returned pt message regarding refill of Ambien 5 mg filled on 05/02/21. Pt stated that it's helped and that she is sleeping every night. On return call writer spoke to sister Allison Sharp, whom pt has approved to give and receive information, as pt was not available. Writer has spoken with both pt and sister together multiple times previously. Writer advised that it's too early to fill prescription. Writer also advised that medication is written as PRN however pt has been taking qhs as it helps so much. Pat asked if you would pend an order until 06/02/21? Pt next appointment is scheduled for 07/28/21. Please review and advise.

## 2021-05-26 ENCOUNTER — Telehealth (HOSPITAL_COMMUNITY): Payer: Self-pay | Admitting: *Deleted

## 2021-05-26 NOTE — Telephone Encounter (Signed)
Lab results received from [facility]. Labs collected on 05/24/21. Valproic acid level low @ 42 ug/ml. Triglycerides high @ 291 mg/dl. VLDL cholesterol high @ 47 mg/dl. Otherwise all WNL. Will scan to chart.

## 2021-06-01 ENCOUNTER — Other Ambulatory Visit (HOSPITAL_COMMUNITY): Payer: Self-pay | Admitting: Psychiatry

## 2021-06-01 DIAGNOSIS — F5105 Insomnia due to other mental disorder: Secondary | ICD-10-CM

## 2021-06-03 ENCOUNTER — Ambulatory Visit: Payer: Medicare Other | Admitting: Internal Medicine

## 2021-06-16 ENCOUNTER — Other Ambulatory Visit: Payer: Self-pay | Admitting: Family Medicine

## 2021-06-17 ENCOUNTER — Other Ambulatory Visit: Payer: Self-pay | Admitting: Family

## 2021-06-17 DIAGNOSIS — Z1231 Encounter for screening mammogram for malignant neoplasm of breast: Secondary | ICD-10-CM

## 2021-07-01 ENCOUNTER — Ambulatory Visit: Payer: Medicare Other

## 2021-07-14 ENCOUNTER — Encounter: Payer: Self-pay | Admitting: Internal Medicine

## 2021-07-14 ENCOUNTER — Ambulatory Visit (INDEPENDENT_AMBULATORY_CARE_PROVIDER_SITE_OTHER): Payer: Medicare Other | Admitting: Internal Medicine

## 2021-07-14 ENCOUNTER — Other Ambulatory Visit: Payer: Self-pay

## 2021-07-14 VITALS — BP 143/95 | HR 82 | Ht 69.0 in | Wt 343.0 lb

## 2021-07-14 DIAGNOSIS — I25118 Atherosclerotic heart disease of native coronary artery with other forms of angina pectoris: Secondary | ICD-10-CM

## 2021-07-14 DIAGNOSIS — R0609 Other forms of dyspnea: Secondary | ICD-10-CM | POA: Diagnosis not present

## 2021-07-14 DIAGNOSIS — Z79899 Other long term (current) drug therapy: Secondary | ICD-10-CM | POA: Diagnosis not present

## 2021-07-14 DIAGNOSIS — I10 Essential (primary) hypertension: Secondary | ICD-10-CM | POA: Diagnosis not present

## 2021-07-14 NOTE — Progress Notes (Signed)
Cardiology Office Note   Date:  07/14/2021   ID:  Allison Sharp, DOB [birth year], MRN [id number]  PCP:  Wannetta Sender, FNP  Cardiologist:   Dorris Carnes, MD       History of Present Illness: Allison Sharp is a 66 y.o. female with a history of chest pressure, palpitations, PAF (CHADS2VASc of 3), HTN ,  orthostatic hypotension. HL Cath in 2020 showed no significant CAD   The pt was hospitalized  in Aug 2022 with orthostatic hypotension and syncope   [name] and passed out  Clonidine and lasix stopped.  Echo showed normal biventricular function   Sent home with event monitor  This showed atrial fibrillation as well as NSVT  After D/c she was seen in cardiology clinic at [facility] in Aug    Recomm a PET CT perfusion scan due to size.    Monitor showed atrial fibrillation.  Recomm Eliquis and Toprol XL    The pt denies dizziness.   Breathing is OK   Denies palpitations      Says she is feeling better   No CP    Current Meds  Medication Sig   apixaban (ELIQUIS) 5 MG TABS tablet Take by mouth.   atorvastatin (LIPITOR) 20 MG tablet Take 1 tablet (20 mg total) by mouth daily. (NEEDS TO BE SEEN BEFORE NEXT REFILL)   cetirizine (ZYRTEC) 10 MG tablet TAKE 1 TABLET ONCE DAILY   diclofenac sodium (VOLTAREN) 1 % GEL Apply to affected area twice daily as needed (Patient taking differently: Apply 2 g topically 2 (two) times daily as needed (PAIN).)   divalproex (DEPAKOTE) 500 MG DR tablet Take 2 tablets (1,000 mg total) by mouth at bedtime.   DULoxetine (CYMBALTA) 60 MG capsule Take 1 capsule (60 mg total) by mouth in the morning.   furosemide (LASIX) 20 MG tablet Take 20 mg by mouth as needed. Takes if gaines 3 pounds   levothyroxine (SYNTHROID) 125 MCG tablet TAKE 1 TABLET DAILY   metoprolol succinate (TOPROL-XL) 25 MG 24 hr tablet Take 1 tablet by mouth daily.   therapeutic multivitamin-minerals (THERAGRAN-M) tablet Take 1 tablet by mouth daily.   traZODone  (DESYREL) 50 MG tablet Take 1 tablet (50 mg total) by mouth at bedtime.   [DISCONTINUED] albuterol (PROAIR HFA) 108 (90 Base) MCG/ACT inhaler USE 2 PUFF EVERY 4 HOURS AS NEEDED FOR WHEEZING OR SHORTNESS OF BREATH   [DISCONTINUED] budesonide-formoterol (SYMBICORT) 80-4.5 MCG/ACT inhaler Inhale 2 puffs into the lungs in the morning and at bedtime.   [DISCONTINUED] celecoxib (CELEBREX) 200 MG capsule Take 1 capsule (200 mg total) by mouth 2 (two) times daily.   [DISCONTINUED] fluticasone (FLONASE) 50 MCG/ACT nasal spray Place 1 spray 2 (two) times daily as needed into both nostrils for allergies or rhinitis.   [DISCONTINUED] ipratropium (ATROVENT) 0.06 % nasal spray 2 sprays into each nostril Three (3) times a day.   [DISCONTINUED] linaclotide (LINZESS) 290 MCG CAPS capsule Take 1 capsule (290 mcg total) by mouth daily before breakfast.   [DISCONTINUED] lisinopril (ZESTRIL) 5 MG tablet Take 1 tablet (5 mg total) by mouth daily. (NEEDS TO BE SEEN BEFORE NEXT REFILL)   [DISCONTINUED] omeprazole (PRILOSEC) 20 MG capsule TAKE (1) CAPSULE DAILY   [DISCONTINUED] OXYGEN Inhale 2 L into the lungs daily.   [DISCONTINUED] Vitamin D, Ergocalciferol, (DRISDOL) 1.25 MG (50000 UT) CAPS capsule TAKE 1 CAPSULE EVERY 7 SEVEN DAYS   [DISCONTINUED] zolpidem (AMBIEN) 5 MG tablet  Take 1 tablet (5 mg total) by mouth at bedtime as needed for sleep.     Allergies:   Abilify [aripiprazole], Naproxen, Ativan [lorazepam], Haldol [haloperidol], and Hydroxyzine   Past Medical History:  Diagnosis Date   Allergy    Anxiety    Arthritis    Asthma    Bipolar 1 disorder (Standard)    CAD (coronary artery disease)    Cellulitis    CHF (congestive heart failure) (HCC)    diastolic dysfunction   Chronic back pain    Chronic headaches    Complication of anesthesia    States she typically gets sick s/p anesthesia   Contusion of sacrum    COPD (chronic obstructive pulmonary disease) (HCC)    Dementia (HCC)    Depression     Dyspnea    PFT 03/05/09 FEV1 2.77(98%), FVC 3.25(86%), FEV1% 85, TLC 5.88(99%), DLCO 60% ,  Methacholine challenge 03/16/09 normal ,  CT chest 03/12/09 no pulmonary disease   Fungal infection    GERD (gastroesophageal reflux disease)    History of colonoscopy 10/17/2002   by Dr Laural Golden distal non-specific proctitis, small ext hemorrhoids,    HTN (hypertension)    Hyperlipidemia    Hyperthyroidism    radioactive - thyroid cancer    Migraine headache    Morbid obesity with body mass index of 50.0-59.9 in adult ([place]) JAN 2011 370 LBS   2004 311 BMI 45.9   Myocardial infarction ([place])    NOV 1997   OSA on CPAP    she had been on 2L O2 at night but that was stopped   PONV (postoperative nausea and vomiting)    PTSD (post-traumatic stress disorder)    PTSD (post-traumatic stress disorder)    Suicidal ideation    Urine incontinence    Vitamin D deficiency     Past Surgical History:  Procedure Laterality Date   ABDOMINAL HYSTERECTOMY     sept 1996   APPENDECTOMY     BACK SURGERY  2008   CARDIAC CATHETERIZATION     nov 1997   CHOLECYSTECTOMY     COLONOSCOPY  10/17/2002    Distal proctitis, small external hemorrhoids, otherwise/  normal colonoscopy. Suspect rectal bleeding secondary to hemorrhoids   ESOPHAGOGASTRODUODENOSCOPY  03/18/09   fundic gland polyps/mild gastritis   HERNIA REPAIR  1978   JOINT REPLACEMENT     bil knee replacement   KNEE ARTHROSCOPY     LEFT HEART CATH AND CORONARY ANGIOGRAPHY N/A 08/08/2018   Procedure: LEFT HEART CATH AND CORONARY ANGIOGRAPHY;  Surgeon: Jettie Booze, MD;  Location: [place] [facility];  Service: Cardiovascular;  Laterality: N/A;   MULTIPLE EXTRACTIONS WITH ALVEOLOPLASTY N/A 08/16/2015   Procedure: EXTRACTION OF TEETH THREE, SIX, EIGHT, NINE, ELEVEN, FOURTEEN, FIFTEEN, TWENTY-EIGHT WITH ALVEOLOPLASTY;  Surgeon: Diona Browner, DDS;  Location: [facility] [place] [facility];  Service: Oral Surgery;  Laterality: N/A;   ROTATOR CUFF REPAIR Right    TONSILLECTOMY      TOTAL VAGINAL HYSTERECTOMY     TUBAL LIGATION       Social History:  The patient  reports that she has never smoked. She has never used smokeless tobacco. She reports that she does not drink alcohol and does not use drugs.   Family History:  The patient's family history includes AAA (abdominal aortic aneurysm) in her mother; Alcohol abuse in her father and son; Arthritis in her sister; Bipolar disorder in her brother, mother, and sister; Cancer in her maternal aunt, maternal grandfather, sister, and son;  Coronary artery disease in her brother and father; Dementia in her mother; Depression in her brother, mother, sister, and sister; Drug abuse in her son; Hypertension in her brother, mother, and sister; Irregular heart beat in her sister; Paranoid behavior in her sister. She was adopted.    ROS:  Please see the history of present illness. All other systems are reviewed and  Negative to the above problem except as noted.    PHYSICAL EXAM: VS:  BP (!) 143/95    Pulse 82    Ht 5\' 9"  (1.753 m)    Wt (!) 343 lb (155.6 kg)    LMP 02/18/1995    BMI 50.65 kg/m   GEN: Morbidly obese 66 yo in no acute distress  HEENT: normal  Neck: no JVD.  No carotid bruits Cardiac: RRR; no murmurs  No LE  edema  Respiratory:  clear to auscultation bilaterally, GI: soft, nontender, nondistended, + BS  No hepatomegaly  MS: no deformity Moving all extremities   Skin: warm and dry, no rash Neuro:  Strength and sensation are intact Psych: euthymic mood, full affect   EKG:  EKG is ordered today.  SR 82 bpm  Nonspecific ST changes    L Heart cath   08/08/18  Mid LAD lesion is 10% stenosed. The left ventricular systolic function is normal. LV end diastolic pressure is normal. The left ventricular ejection fraction is 55-65% by visual estimate. There is no aortic valve stenosis.   No significant CAD.   Continue preventive therapy.  Lipid Panel    Component Value Date/Time   CHOL 155 07/14/2020 1007    TRIG 191 (H) 07/14/2020 1007   HDL 38 (L) 07/14/2020 1007   CHOLHDL 4.1 07/14/2020 1007   CHOLHDL 4.3 08/08/2018 0548   VLDL 23 08/08/2018 0548   LDLCALC 84 07/14/2020 1007      Wt Readings from Last 3 Encounters:  07/14/21 (!) 343 lb (155.6 kg)  08/26/20 (!) 345 lb 12.8 oz (156.9 kg)  07/28/20 (!) 358 lb (162.4 kg)      ASSESSMENT AND PLAN:  1   Chest pressure  Patient denies     I would follow   Min CAD at cath  2   Dizziness/ sycnope   Currently doing OK on current regimen   No dizziness   Will check labs  Encouraged her to stay hydrated  3  PAF   Keep on current regimen of metoprolol and eliquis     4  HTN   BP is a little high today   with hx of syncope I would recomm she follow      5  HL  Continue lipitor      Current medicines are reviewed at length with the patient today.  The patient does not have concerns regarding medicines.  Signed, Dorris Carnes, MD  07/14/2021 2:28 PM    [facility] [address], [place], [place]  [address] Phone: [phone]; Fax: [phone]

## 2021-07-14 NOTE — Patient Instructions (Signed)
Medication Instructions:  Your physician recommends that you continue on your current medications as directed. Please refer to the Current Medication list given to you today.  *If you need a refill on your cardiac medications before your next appointment, please call your pharmacy*   Lab Work: BMET, PRO BNP, UA, CBC TODAY  If you have labs (blood work) drawn today and your tests are completely normal, you will receive your results only by: [place] (if you have MyChart) OR A paper copy in the mail If you have any lab test that is abnormal or we need to change your treatment, we will call you to review the results.   Testing/Procedures: Your physician has requested that you have an echocardiogram. Echocardiography is a painless test that uses sound waves to create images of your heart. It provides your doctor with information about the size and shape of your heart and how well your hearts chambers and valves are working. This procedure takes approximately one hour. There are no restrictions for this procedure.    Follow-Up: At [facility], you and your health needs are our priority.  As part of our continuing mission to provide you with exceptional heart care, we have created designated Provider Care Teams.  These Care Teams include your primary Cardiologist (physician) and Advanced Practice Providers (APPs -  Physician Assistants and Nurse Practitioners) who all work together to provide you with the care you need, when you need it.  We recommend signing up for the patient portal called "MyChart".  Sign up information is provided on this After Visit Summary.  MyChart is used to connect with patients for Virtual Visits (Telemedicine).  Patients are able to view lab/test results, encounter notes, upcoming appointments, etc.  Non-urgent messages can be sent to your provider as well.   To learn more about what you can do with MyChart, go to [email or web address].

## 2021-07-15 LAB — BASIC METABOLIC PANEL
BUN/Creatinine Ratio: 9 — ABNORMAL LOW (ref 12–28)
BUN: 9 mg/dL (ref 8–27)
CO2: 23 mmol/L (ref 20–29)
Calcium: 10.1 mg/dL (ref 8.7–10.3)
Chloride: 103 mmol/L (ref 96–106)
Creatinine, Ser: 0.98 mg/dL (ref 0.57–1.00)
Glucose: 99 mg/dL (ref 70–99)
Potassium: 4.5 mmol/L (ref 3.5–5.2)
Sodium: 144 mmol/L (ref 134–144)
eGFR: 63 mL/min/{1.73_m2} (ref >59)

## 2021-07-15 LAB — CBC
Hematocrit: 44.5 % (ref 34.0–46.6)
Hemoglobin: 14.8 g/dL (ref 11.1–15.9)
MCH: 31 pg (ref 26.6–33.0)
MCHC: 33.3 g/dL (ref 31.5–35.7)
MCV: 93 fL (ref 79–97)
Platelets: 193 10*3/uL (ref 150–450)
RBC: 4.77 x10E6/uL (ref 3.77–5.28)
RDW: 12.9 % (ref 11.7–15.4)
WBC: 8.1 10*3/uL (ref 3.4–10.8)

## 2021-07-15 LAB — PRO B NATRIURETIC PEPTIDE: NT-Pro BNP: 105 pg/mL (ref 0–301)

## 2021-07-18 ENCOUNTER — Telehealth: Payer: Self-pay | Admitting: Internal Medicine

## 2021-07-18 NOTE — Telephone Encounter (Signed)
Fay Records, MD  07/17/2021 10:22 PM EST Back to Top    CBC is normal  Electrolytes and kidney unction are normal Overall luid appears normal     The patient has been notified of the result and verbalized understanding.  All questions (if any) were answered. Nuala Alpha, LPN 9/80/0123 93:59 AM

## 2021-07-18 NOTE — Telephone Encounter (Signed)
° °  Pt said she received a message that her lab result is on mychart, she said she cant get in to her mychart and asking if she can get a call to get the result

## 2021-07-28 ENCOUNTER — Other Ambulatory Visit: Payer: Self-pay

## 2021-07-28 ENCOUNTER — Telehealth (HOSPITAL_BASED_OUTPATIENT_CLINIC_OR_DEPARTMENT_OTHER): Payer: Medicare Other | Admitting: Psychiatry

## 2021-07-28 ENCOUNTER — Encounter (HOSPITAL_COMMUNITY): Payer: Self-pay | Admitting: Psychiatry

## 2021-07-28 VITALS — Wt 343.0 lb

## 2021-07-28 DIAGNOSIS — F3160 Bipolar disorder, current episode mixed, unspecified: Secondary | ICD-10-CM

## 2021-07-28 DIAGNOSIS — F411 Generalized anxiety disorder: Secondary | ICD-10-CM | POA: Diagnosis not present

## 2021-07-28 DIAGNOSIS — F5105 Insomnia due to other mental disorder: Secondary | ICD-10-CM | POA: Diagnosis not present

## 2021-07-28 MED ORDER — DIVALPROEX SODIUM 500 MG PO DR TAB: 1000.0000 mg | DELAYED_RELEASE_TABLET | Freq: Every day | ORAL | 2 refills | Status: DC

## 2021-07-28 MED ORDER — DULOXETINE HCL 60 MG PO CPEP: 60.0000 mg | ORAL_CAPSULE | Freq: Every morning | ORAL | 2 refills | Status: DC

## 2021-07-28 NOTE — Progress Notes (Signed)
Virtual Visit via Telephone Note ? ?I connected with MELESA LECY on 07/28/21 at  2:20 PM EST by telephone and verified that I am speaking with the correct person using two identifiers. ? ?Location: ?Patient: Home ?Provider: Home Office ?  ?I discussed the limitations, risks, security and privacy concerns of performing an evaluation and management service by telephone and the availability of in person appointments. I also discussed with the patient that there may be a patient responsible charge related to this service. The patient expressed understanding and agreed to proceed. ? ? ?History of Present Illness: ?Patient is evaluated by phone session.  She reported her mood is a stable.  We have tried Ambien because she was complaining of poor sleep but her insurance did not approve.  Now her PCP is giving temazepam 7.5 mg at bedtime.  She changed her PCP and not seeing Dr. Evelina Dun at [facility].  She denies any dizziness or any fall.  She feels her mood is stable and denies any mania, hallucination, suicidal thoughts.  Her anxiety remains the same and sometimes she does not like to go outside.  She is getting along with her sister.  She has no tremor or shakes or any EPS.  She had blood work on December 12 and her Depakote level was 42.  She has chronic memory issues but it is stable.  She wants to keep the current medication.   ? ?Past Psychiatric History: Reviewed. ?H/O multiple inpatient. Last admissions in May 2020 at [facility]. H/O suicidal attempt by taking overdose.  Diagnosed with PTSD, depression, bipolar disorder.  Tried Paxil, Prozac, Wellbutrin, Effexor, Lexapro, amitriptyline, Cymbalta, Abilify (Tremors), Neurontin, trazodone, Thorazine, hydroxyzine, Luvox, Sinequan, Zyprexa, Ambien and Latuda.  Best responded on Risperdal and Cymbalta.   ? ?Psychiatric Specialty Exam: ?Physical Exam  ?Review of Systems  ?Weight (!) 343 lb (155.6 kg), last menstrual period 02/18/1995.Body mass  index is 50.65 kg/m?.  ?General Appearance: NA  ?Eye Contact:  NA  ?Speech:  Slow  ?Volume:  Normal  ?Mood:  Euthymic  ?Affect:  NA  ?Thought Process:  Goal Directed  ?Orientation:  Full (Time, Place, and Person)  ?Thought Content:  Logical  ?Suicidal Thoughts:  No  ?Homicidal Thoughts:  No  ?Memory:  Immediate;   Fair ?Recent;   Fair ?Remote;   Fair  ?Judgement:  Fair  ?Insight:  Present  ?Psychomotor Activity:  NA  ?Concentration:  Concentration: Fair and Attention Span: Fair  ?Recall:  Fair  ?Fund of Knowledge:  Fair  ?Language:  Good  ?Akathisia:  No  ?Handed:  Right  ?AIMS (if indicated):     ?Assets:  Communication Skills ?Desire for Improvement ?Housing ?Social Support  ?ADL's:  Intact  ?Cognition:  WNL  ?Sleep:   5-6 hrs  ? ?Assessment and Plan: ?Bipolar disorder type I.  Generalized anxiety disorder.  Insomnia due to mental disorder. ? ?Patient is a stable on her current medication.  Her last Depakote level was 42 which was done on December 21.  She is now seeing a new PCP.  She is taking temazepam 7.5 mg from her new PCP.  We will discontinue Ambien and trazodone.  Patient like to keep the current medication.  We will continue Depakote 100 mg at bedtime and Cymbalta 60 mg daily.  Recommended to call us back if she has any question or any concern.  Follow-up in 3 months. ? ?Follow Up Instructions: ? ?  ?I discussed the assessment and treatment plan with  the patient. The patient was provided an opportunity to ask questions and all were answered. The patient agreed with the plan and demonstrated an understanding of the instructions. ?  ?The patient was advised to call back or seek an in-person evaluation if the symptoms worsen or if the condition fails to improve as anticipated. ? ?I provided 16 minutes of non-face-to-face time during this encounter. ? ? ?Kathlee Nations, MD  ?

## 2021-08-01 ENCOUNTER — Ambulatory Visit (HOSPITAL_COMMUNITY): Payer: Medicare Other | Attending: Internal Medicine

## 2021-08-01 ENCOUNTER — Other Ambulatory Visit: Payer: Self-pay

## 2021-08-01 DIAGNOSIS — I10 Essential (primary) hypertension: Secondary | ICD-10-CM

## 2021-08-01 DIAGNOSIS — Z79899 Other long term (current) drug therapy: Secondary | ICD-10-CM

## 2021-08-01 DIAGNOSIS — R0609 Other forms of dyspnea: Secondary | ICD-10-CM | POA: Diagnosis not present

## 2021-08-01 LAB — ECHOCARDIOGRAM COMPLETE
Area-P 1/2: 3.22 cm2
S' Lateral: 3.5 cm

## 2021-08-01 MED ORDER — PERFLUTREN LIPID MICROSPHERE
1.0000 mL | INTRAVENOUS | Status: AC | PRN
  Administered 2021-08-01: 2 mL via INTRAVENOUS

## 2021-08-02 ENCOUNTER — Ambulatory Visit: Payer: Medicare Other

## 2021-08-02 ENCOUNTER — Telehealth: Payer: Self-pay | Admitting: Internal Medicine

## 2021-08-02 NOTE — Telephone Encounter (Signed)
I spoke with patient and told her we would call her with results once reviewed by Dr Harrington Challenger ?

## 2021-08-02 NOTE — Telephone Encounter (Signed)
Follow Up: ? ? ? ?Patient said her Echo results came on My Chart from yesterday. She says she needs the results please, because she does not know how to use My Chart.s ?

## 2021-08-03 NOTE — Telephone Encounter (Signed)
Pt and her sister on DPR advised results and follow up appt made.  ?

## 2021-09-07 ENCOUNTER — Other Ambulatory Visit (HOSPITAL_COMMUNITY): Payer: Self-pay | Admitting: Psychiatry

## 2021-09-07 DIAGNOSIS — F3160 Bipolar disorder, current episode mixed, unspecified: Secondary | ICD-10-CM

## 2021-09-08 ENCOUNTER — Other Ambulatory Visit (HOSPITAL_COMMUNITY): Payer: Self-pay | Admitting: *Deleted

## 2021-09-08 DIAGNOSIS — F3160 Bipolar disorder, current episode mixed, unspecified: Secondary | ICD-10-CM

## 2021-09-08 MED ORDER — RISPERIDONE 4 MG PO TABS: 4.0000 mg | ORAL_TABLET | Freq: Every day | ORAL | 0 refills | Status: DC

## 2021-10-11 ENCOUNTER — Other Ambulatory Visit (HOSPITAL_COMMUNITY): Payer: Self-pay | Admitting: Psychiatry

## 2021-10-11 DIAGNOSIS — F3160 Bipolar disorder, current episode mixed, unspecified: Secondary | ICD-10-CM

## 2021-10-26 ENCOUNTER — Telehealth (HOSPITAL_BASED_OUTPATIENT_CLINIC_OR_DEPARTMENT_OTHER): Payer: Medicare Other | Admitting: Psychiatry

## 2021-10-26 ENCOUNTER — Encounter (HOSPITAL_COMMUNITY): Payer: Self-pay | Admitting: Psychiatry

## 2021-10-26 VITALS — Wt 350.0 lb

## 2021-10-26 DIAGNOSIS — F411 Generalized anxiety disorder: Secondary | ICD-10-CM

## 2021-10-26 DIAGNOSIS — F5105 Insomnia due to other mental disorder: Secondary | ICD-10-CM

## 2021-10-26 DIAGNOSIS — F3160 Bipolar disorder, current episode mixed, unspecified: Secondary | ICD-10-CM

## 2021-10-26 MED ORDER — DULOXETINE HCL 60 MG PO CPEP: 60.0000 mg | ORAL_CAPSULE | Freq: Every morning | ORAL | 1 refills | Status: DC

## 2021-10-26 MED ORDER — TRAZODONE HCL 50 MG PO TABS: 100.0000 mg | ORAL_TABLET | Freq: Every day | ORAL | 1 refills | Status: DC

## 2021-10-26 MED ORDER — DIVALPROEX SODIUM 500 MG PO DR TAB: 1000.0000 mg | DELAYED_RELEASE_TABLET | Freq: Every day | ORAL | 1 refills | Status: DC

## 2021-10-26 MED ORDER — RISPERIDONE 4 MG PO TABS: 4.0000 mg | ORAL_TABLET | Freq: Every day | ORAL | 1 refills | Status: DC

## 2021-10-26 NOTE — Progress Notes (Signed)
Virtual Visit via Telephone Note  I connected with Allison Sharp on 10/26/21 at  2:00 PM EDT by telephone and verified that I am speaking with the correct person using two identifiers.  Location: Patient: Home Provider: Home Office   I discussed the limitations, risks, security and privacy concerns of performing an evaluation and management service by telephone and the availability of in person appointments. I also discussed with the patient that there may be a patient responsible charge related to this service. The patient expressed understanding and agreed to proceed.   History of Present Illness: Patient is evaluated by phone session.  She reported not doing very well and not sleeping well.  She struggled with depression, irritability and admitted getting short tempered with her sister.  She is sleeping only few hours.  She is taking temazepam 7.5 mg prescribed by her PCP Dr. Hart Carwin at [facility] however her PCP is left the practice and she is struggling to find a new primary care doctor.  We have given in the past trazodone and sometimes Ambien for sleep but it was discontinued after she started taking the temazepam.  She denies any dizziness or any fall.  She denies any suicidal thoughts but admitted more isolated, withdrawn, irritable.  Her last Depakote level was 42 which was done last year.  She also struggled with chronic memory issues and sometimes not able to remember things.  She denies any hallucination, paranoia.  She does not go outside.  She admitted few pounds weight gain because she is not active.   Past Psychiatric History: Reviewed. H/O multiple inpatient. Last admissions in May 2020 at [facility]. H/O suicidal attempt by taking overdose.  Diagnosed with PTSD, depression, bipolar disorder.  Tried Paxil, Prozac, Wellbutrin, Effexor, Lexapro, amitriptyline, Cymbalta, Abilify (Tremors), Neurontin, trazodone, Thorazine, hydroxyzine, Luvox, Sinequan, Zyprexa, Ambien and  Latuda.  Best responded on Risperdal and Cymbalta.    Psychiatric Specialty Exam: Physical Exam  Review of Systems  Weight (!) 350 lb (158.8 kg), last menstrual period 02/18/1995.There is no height or weight on file to calculate BMI.  General Appearance: NA  Eye Contact:  NA  Speech:  Slow  Volume:  Decreased  Mood:  Dysphoric  Affect:  NA  Thought Process:  Descriptions of Associations: Intact  Orientation:  Full (Time, Place, and Person)  Thought Content:  Rumination  Suicidal Thoughts:  No  Homicidal Thoughts:  No  Memory:  Immediate;   Fair Recent;   Fair Remote;   Fair  Judgement:  Intact  Insight:  Present  Psychomotor Activity:  NA  Concentration:  Concentration: Fair and Attention Span: Fair  Recall:  [employer] of Knowledge:  Good  Language:  Good  Akathisia:  No  Handed:  Right  AIMS (if indicated):     Assets:  Communication Skills Desire for Improvement Housing Social Support  ADL's:  Intact  Cognition:  WNL  Sleep:   poor      Assessment and Plan: Bipolar disorder type I.  Generalized anxiety disorder.  Insomnia due to mental disorder.  I recommend to discontinue temazepam since it is not working.  We will restart the trazodone 50 mg at bedtime.  We will do Depakote level as last level was 42 which was done in December.  She is trying to find a new PCP.  We will continue Depakote 1000 mg at bedtime, Cymbalta 60 mg daily, Risperdal 4 mg at bedtime.  Follow up in 4 weeks to see the response  of the medication.  If patient do not improve we will do further medication adjustment.  I encouraged to call us back if she has any question, concern or if she feels worsening of the symptoms.  Discuss safety concerns at any time having active suicidal thoughts or homicidal thought then she need to call 911 or go to local emergency room.  Follow Up Instructions:    I discussed the assessment and treatment plan with the patient. The patient was provided an opportunity to  ask questions and all were answered. The patient agreed with the plan and demonstrated an understanding of the instructions.   The patient was advised to call back or seek an in-person evaluation if the symptoms worsen or if the condition fails to improve as anticipated.  Collaboration of Care: Primary Care Provider AEB notes are available in epic to review.  Patient/Guardian was advised Release of Information must be obtained prior to any record release in order to collaborate their care with an outside provider. Patient/Guardian was advised if they have not already done so to contact the registration department to sign all necessary forms in order for [name] to release information regarding their care.   Consent: Patient/Guardian gives verbal consent for treatment and assignment of benefits for services provided during this visit. Patient/Guardian expressed understanding and agreed to proceed.    I provided 28 minutes of non-face-to-face time during this encounter.   Kathlee Nations, MD

## 2021-10-28 ENCOUNTER — Other Ambulatory Visit (HOSPITAL_COMMUNITY): Payer: Self-pay | Admitting: Psychiatry

## 2021-10-28 ENCOUNTER — Other Ambulatory Visit (HOSPITAL_COMMUNITY): Payer: Self-pay | Admitting: *Deleted

## 2021-10-28 DIAGNOSIS — F3160 Bipolar disorder, current episode mixed, unspecified: Secondary | ICD-10-CM

## 2021-10-28 DIAGNOSIS — F5105 Insomnia due to other mental disorder: Secondary | ICD-10-CM

## 2021-10-28 DIAGNOSIS — F411 Generalized anxiety disorder: Secondary | ICD-10-CM

## 2021-10-28 MED ORDER — TRAZODONE HCL 50 MG PO TABS: 100.0000 mg | ORAL_TABLET | Freq: Every day | ORAL | 1 refills | Status: DC

## 2021-11-16 ENCOUNTER — Ambulatory Visit: Payer: Medicare Other

## 2021-11-21 ENCOUNTER — Telehealth (HOSPITAL_COMMUNITY): Payer: Medicare Other | Admitting: Psychiatry

## 2021-11-24 ENCOUNTER — Telehealth (HOSPITAL_BASED_OUTPATIENT_CLINIC_OR_DEPARTMENT_OTHER): Payer: Medicare Other | Admitting: Psychiatry

## 2021-11-24 ENCOUNTER — Telehealth (HOSPITAL_COMMUNITY): Payer: Medicare Other | Admitting: Psychiatry

## 2021-11-24 ENCOUNTER — Encounter (HOSPITAL_COMMUNITY): Payer: Self-pay | Admitting: Psychiatry

## 2021-11-24 DIAGNOSIS — F5105 Insomnia due to other mental disorder: Secondary | ICD-10-CM

## 2021-11-24 DIAGNOSIS — F411 Generalized anxiety disorder: Secondary | ICD-10-CM | POA: Diagnosis not present

## 2021-11-24 DIAGNOSIS — F3160 Bipolar disorder, current episode mixed, unspecified: Secondary | ICD-10-CM | POA: Diagnosis not present

## 2021-11-24 MED ORDER — TRAZODONE HCL 50 MG PO TABS: 50.0000 mg | ORAL_TABLET | Freq: Every day | ORAL | 2 refills | Status: DC

## 2021-11-24 MED ORDER — RISPERIDONE 4 MG PO TABS: 4.0000 mg | ORAL_TABLET | Freq: Every day | ORAL | 2 refills | Status: DC

## 2021-11-24 MED ORDER — DULOXETINE HCL 60 MG PO CPEP: 60.0000 mg | ORAL_CAPSULE | Freq: Every morning | ORAL | 2 refills | Status: DC

## 2021-11-24 MED ORDER — DIVALPROEX SODIUM 500 MG PO DR TAB: 1000.0000 mg | DELAYED_RELEASE_TABLET | Freq: Every day | ORAL | 2 refills | Status: DC

## 2021-11-24 NOTE — Progress Notes (Signed)
Virtual Visit via Video Note  I connected with Allison Sharp on 11/24/21 at  3:00 PM EDT by a video enabled telemedicine application and verified that I am speaking with the correct person using two identifiers.  Location: Patient: Home Provider: Home Office   I discussed the limitations of evaluation and management by telemedicine and the availability of in person appointments. The patient expressed understanding and agreed to proceed.  History of Present Illness: Patient is evaluated by video session.  On the last visit we discontinue temazepam and try trazodone which had helped her in the past.  She is sleeping much better.  She is taking 50 mg but sometimes he takes 2 pills.  She admitted sometime forgetful and does not remember things very well.  Lately her phone is also not working very well.  Today she came into the office did not realize it was a virtual visit.  She is getting along with her sister who actually drove her to appointments.  She is sleeping at least 7 hours.  She denies any mania, psychosis, hallucination.  She denies any dizziness or fall but admitted having tremors and shakes which are chronic but sometimes he noticed more.  He does not drive and does not go outside.  She denies any paranoia or any suicidal thoughts.  Her appetite is okay.  Patient has upcoming appointment with her new PCP Dr. Kittie Plater in August.    Past Psychiatric History: Reviewed. H/O multiple inpatient. Last admissions in May 2020 at [facility]. H/O suicidal attempt by taking overdose.  Diagnosed with PTSD, depression, bipolar disorder.  Tried Paxil, Prozac, Wellbutrin, Effexor, Lexapro, amitriptyline, Cymbalta, Abilify (Tremors), Neurontin, trazodone, Thorazine, hydroxyzine, Luvox, Sinequan, Zyprexa, Ambien and Latuda.  Best responded on Risperdal and Cymbalta.     Psychiatric Specialty Exam: Physical Exam  Review of Systems  Weight (!) 350 lb (158.8 kg), last menstrual period  02/18/1995.There is no height or weight on file to calculate BMI.  General Appearance: Casual  Eye Contact:  Fair  Speech:  Slow  Volume:  Decreased  Mood:  Anxious  Affect:  Congruent  Thought Process:  Descriptions of Associations: Intact  Orientation:  Full (Time, Place, and Person)  Thought Content:  Rumination  Suicidal Thoughts:  No  Homicidal Thoughts:  No  Memory:  Immediate;   Fair Recent;   Fair Remote;   Fair  Judgement:  Fair  Insight:  Shallow  Psychomotor Activity:  Tremor  Concentration:  Concentration: Fair and Attention Span: Fair  Recall:  [employer] of Knowledge:  Fair  Language:  Fair  Akathisia:  NA  Handed:  Right  AIMS (if indicated):     Assets:  Communication Skills Desire for Improvement Housing Social Support  ADL's:  Intact  Cognition:  Impaired,  Mild  Sleep:   7 years      Assessment and Plan: Bipolar disorder type I.  Generalized anxiety disorder.  Insomnia due to mental disorder.  Patient doing better with trazodone 50 mg at bedtime.  She did not do the blood work and Depakote level.  I encouraged to keep the appointment with the new PCP and we will forward a message so she can get the Depakote level at her appointment.  I also recommend she should see a neurologist for tremors which she noticed recently got worse.  She is on Cymbalta 60 mg daily, Risperdal 4 mg at bedtime, Depakote 1000 mg at bedtime and now she is taking trazodone 50 mg at  bedtime.  Recommended to call us back if is any question or any concern.  Follow-up in 3 months.  Follow Up Instructions:    I discussed the assessment and treatment plan with the patient. The patient was provided an opportunity to ask questions and all were answered. The patient agreed with the plan and demonstrated an understanding of the instructions.   The patient was advised to call back or seek an in-person evaluation if the symptoms worsen or if the condition fails to improve as  anticipated.  Collaboration of Care: Primary Care Provider AEB patient is scheduled to see her new PCP in August.  We will forward the note and recommended to Depakote level.  Patient/Guardian was advised Release of Information must be obtained prior to any record release in order to collaborate their care with an outside provider. Patient/Guardian was advised if they have not already done so to contact the registration department to sign all necessary forms in order for [name] to release information regarding their care.   Consent: Patient/Guardian gives verbal consent for treatment and assignment of benefits for services provided during this visit. Patient/Guardian expressed understanding and agreed to proceed.    I provided 25 minutes of non-face-to-face time during this encounter.   Kathlee Nations, MD

## 2021-12-02 ENCOUNTER — Ambulatory Visit: Payer: Medicare Other

## 2021-12-14 ENCOUNTER — Other Ambulatory Visit: Payer: Self-pay | Admitting: Family Medicine

## 2021-12-29 ENCOUNTER — Other Ambulatory Visit: Payer: Self-pay | Admitting: Family Medicine

## 2022-01-17 ENCOUNTER — Other Ambulatory Visit (HOSPITAL_COMMUNITY): Payer: Self-pay | Admitting: Psychiatry

## 2022-01-17 DIAGNOSIS — F3160 Bipolar disorder, current episode mixed, unspecified: Secondary | ICD-10-CM

## 2022-01-18 ENCOUNTER — Encounter: Payer: Self-pay | Admitting: Emergency Medicine

## 2022-01-18 ENCOUNTER — Ambulatory Visit (INDEPENDENT_AMBULATORY_CARE_PROVIDER_SITE_OTHER): Payer: Medicare Other | Admitting: Emergency Medicine

## 2022-01-18 DIAGNOSIS — F315 Bipolar disorder, current episode depressed, severe, with psychotic features: Secondary | ICD-10-CM

## 2022-01-18 DIAGNOSIS — I10 Essential (primary) hypertension: Secondary | ICD-10-CM

## 2022-01-18 DIAGNOSIS — I890 Lymphedema, not elsewhere classified: Secondary | ICD-10-CM

## 2022-01-18 DIAGNOSIS — J438 Other emphysema: Secondary | ICD-10-CM

## 2022-01-18 DIAGNOSIS — I25118 Atherosclerotic heart disease of native coronary artery with other forms of angina pectoris: Secondary | ICD-10-CM

## 2022-01-18 DIAGNOSIS — Z9989 Dependence on other enabling machines and devices: Secondary | ICD-10-CM

## 2022-01-18 DIAGNOSIS — I5032 Chronic diastolic (congestive) heart failure: Secondary | ICD-10-CM | POA: Diagnosis not present

## 2022-01-18 DIAGNOSIS — E039 Hypothyroidism, unspecified: Secondary | ICD-10-CM

## 2022-01-18 DIAGNOSIS — Z7689 Persons encountering health services in other specified circumstances: Secondary | ICD-10-CM

## 2022-01-18 DIAGNOSIS — Z8673 Personal history of transient ischemic attack (TIA), and cerebral infarction without residual deficits: Secondary | ICD-10-CM

## 2022-01-18 DIAGNOSIS — G4733 Obstructive sleep apnea (adult) (pediatric): Secondary | ICD-10-CM

## 2022-01-18 NOTE — Patient Instructions (Signed)
Health Maintenance After Age 68 After age 68, you are at a higher risk for certain long-term diseases and infections as well as injuries from falls. Falls are a major cause of broken bones and head injuries in people who are older than age 68. Getting regular preventive care can help to keep you healthy and well. Preventive care includes getting regular testing and making lifestyle changes as recommended by your health care provider. Talk with your health care provider about: Which screenings and tests you should have. A screening is a test that checks for a disease when you have no symptoms. A diet and exercise plan that is right for you. What should I know about screenings and tests to prevent falls? Screening and testing are the best ways to find a health problem early. Early diagnosis and treatment give you the best chance of managing medical conditions that are common after age 68. Certain conditions and lifestyle choices may make you more likely to have a fall. Your health care provider may recommend: Regular vision checks. Poor vision and conditions such as cataracts can make you more likely to have a fall. If you wear glasses, make sure to get your prescription updated if your vision changes. Medicine review. Work with your health care provider to regularly review all of the medicines you are taking, including over-the-counter medicines. Ask your health care provider about any side effects that may make you more likely to have a fall. Tell your health care provider if any medicines that you take make you feel dizzy or sleepy. Strength and balance checks. Your health care provider may recommend certain tests to check your strength and balance while standing, walking, or changing positions. Foot health exam. Foot pain and numbness, as well as not wearing proper footwear, can make you more likely to have a fall. Screenings, including: Osteoporosis screening. Osteoporosis is a condition that causes  the bones to get weaker and break more easily. Blood pressure screening. Blood pressure changes and medicines to control blood pressure can make you feel dizzy. Depression screening. You may be more likely to have a fall if you have a fear of falling, feel depressed, or feel unable to do activities that you used to do. Alcohol use screening. Using too much alcohol can affect your balance and may make you more likely to have a fall. Follow these instructions at home: Lifestyle Do not drink alcohol if: Your health care provider tells you not to drink. If you drink alcohol: Limit how much you have to: 0-1 drink a day for women. 0-2 drinks a day for men. Know how much alcohol is in your drink. In the [place] one drink equals one 12 oz bottle of beer (355 mL), one 5 oz glass of wine (148 mL), or one 1 oz glass of hard liquor (44 mL). Do not use any products that contain nicotine or tobacco. These products include cigarettes, chewing tobacco, and vaping devices, such as e-cigarettes. If you need help quitting, ask your health care provider. Activity  Follow a regular exercise program to stay fit. This will help you maintain your balance. Ask your health care provider what types of exercise are appropriate for you. If you need a cane or walker, use it as recommended by your health care provider. Wear supportive shoes that have nonskid soles. Safety  Remove any tripping hazards, such as rugs, cords, and clutter. Install safety equipment such as grab bars in bathrooms and safety rails on stairs. Keep rooms and walkways   well-lit. General instructions Talk with your health care provider about your risks for falling. Tell your health care provider if: You fall. Be sure to tell your health care provider about all falls, even ones that seem minor. You feel dizzy, tiredness (fatigue), or off-balance. Take over-the-counter and prescription medicines only as told by your health care provider. These include  supplements. Eat a healthy diet and maintain a healthy weight. A healthy diet includes low-fat dairy products, low-fat (lean) meats, and fiber from whole grains, beans, and lots of fruits and vegetables. Stay current with your vaccines. Schedule regular health, dental, and eye exams. Summary Having a healthy lifestyle and getting preventive care can help to protect your health and wellness after age 68. Screening and testing are the best way to find a health problem early and help you avoid having a fall. Early diagnosis and treatment give you the best chance for managing medical conditions that are more common for people who are older than age 68. Falls are a major cause of broken bones and head injuries in people who are older than age 68. Take precautions to prevent a fall at home. Work with your health care provider to learn what changes you can make to improve your health and wellness and to prevent falls. This information is not intended to replace advice given to you by your health care provider. Make sure you discuss any questions you have with your health care provider. Document Revised: 10/04/2020 Document Reviewed: 10/04/2020 Elsevier Patient Education  2023 Elsevier Inc.  

## 2022-01-18 NOTE — Progress Notes (Signed)
Allison Sharp 66 y.o.   Chief Complaint  Patient presents with  . New Patient (Initial Visit)    Pt needs a referral to [facility] due to Hx stroke Referral to Rheumatology     HISTORY OF PRESENT ILLNESS: This is a 66 y.o. female first visit to this office, here to establish care with me. Has the following chronic medical problems: 1.  Bipolar disorder.  Sees psychiatrist on a regular basis.  On multiple medications managed by psychiatrist. 2.  History of hypertension 3.  History of coronary artery disease.  Sees cardiologist on a regular basis. 4.  COPD on Trelegy 5.  History of GERD 6.  History of stroke last year.  On Eliquis. 7.  History of hypothyroidism on Synthroid  HPI   Prior to Admission medications   Medication Sig Start Date End Date Taking? Authorizing Provider  apixaban (ELIQUIS) 5 MG TABS tablet Take by mouth. 01/25/21  Yes [provider]  atorvastatin (LIPITOR) 20 MG tablet Take 1 tablet (20 mg total) by mouth daily. (NEEDS TO BE SEEN BEFORE NEXT REFILL) 04/07/21  Yes Dettinger, Fransisca Kaufmann, MD  cetirizine (ZYRTEC) 10 MG tablet TAKE 1 TABLET ONCE DAILY 12/17/20  Yes Dettinger, Fransisca Kaufmann, MD  diclofenac sodium (VOLTAREN) 1 % GEL Apply to affected area twice daily as needed Patient taking differently: Apply 2 g topically 2 (two) times daily as needed (PAIN). 03/15/16  Yes Dettinger, Fransisca Kaufmann, MD  divalproex (DEPAKOTE) 500 MG DR tablet Take 2 tablets (1,000 mg total) by mouth at bedtime. 11/24/21  Yes Arfeen, Arlyce Harman, MD  DULoxetine (CYMBALTA) 60 MG capsule Take 1 capsule (60 mg total) by mouth in the morning. 11/24/21  Yes Arfeen, Arlyce Harman, MD  furosemide (LASIX) 20 MG tablet Take 20 mg by mouth as needed. Takes if gaines 3 pounds 07/15/20  Yes Dettinger, Fransisca Kaufmann, MD  levothyroxine (SYNTHROID) 125 MCG tablet TAKE 1 TABLET DAILY 03/11/21  Yes Dettinger, Fransisca Kaufmann, MD  metoprolol succinate (TOPROL-XL) 25 MG 24 hr tablet Take 1 tablet by mouth daily.  01/25/21  Yes [provider]  risperidone (RISPERDAL) 4 MG tablet Take 1 tablet (4 mg total) by mouth at bedtime. 11/24/21 02/22/22 Yes Arfeen, Arlyce Harman, MD  traZODone (DESYREL) 50 MG tablet Take 1 tablet (50 mg total) by mouth at bedtime. 11/24/21  Yes Arfeen, Arlyce Harman, MD  temazepam (RESTORIL) 7.5 MG capsule Take 7.5 mg by mouth at bedtime as needed. Patient not taking: Reported on 10/26/2021    Drue Second IV, FNP    Allergies  Allergen Reactions  . Abilify [Aripiprazole] Other (See Comments)    hallucinations  . Naproxen Nausea And Vomiting and Swelling  . Ativan [Lorazepam] Other (See Comments)    Delirium  . Haldol [Haloperidol] Other (See Comments)    Hallucinating   . Hydroxyzine Other (See Comments)    hallucinations    Patient Active Problem List   Diagnosis Date Noted  . Chronic idiopathic thrombocytopenia ([place]) 12/17/2020  . Diastolic congestive heart failure ([place]) 12/17/2020  . Chronic acquired lymphedema 02/07/2019  . Allergies 10/17/2018  . Memory loss 09/16/2018  . Prolonged QT interval 08/06/2018  . Chronic obstructive pulmonary disease ([place]) 05/06/2018  . Orthostatic hypotension 10/24/2017  . Fatty liver 10/24/2017  . Splenic varices 10/22/2017  . Thrombocytopenia ([place]) 09/29/2017  . Visual hallucinations   . Sacroiliac pain 11/13/2016  . Bipolar I disorder, most recent episode depressed, severe w psychosis ([place]) 06/21/2016  . History  of MI (myocardial infarction) 06/21/2016  . Hypertension 06/21/2016  . Spinal stenosis of lumbar region with neurogenic claudication 03/29/2016  . Severe obesity (BMI >= 40) ([place]) 07/05/2015  . Hyperlipidemia LDL goal <130 07/05/2015  . MDD (major depressive disorder), single episode, severe with psychotic features ([place]) 01/21/2015  . Bilateral knee pain 01/07/2014  . Scar condition and fibrosis of skin 12/11/2013  . Essential hypertension, benign 07/01/2013  . OSA on CPAP 11/04/2012  . Back pain, chronic  08/27/2012  . Vitamin D insufficiency 04/04/2012  . Insomnia due to mental disorder 04/04/2012  . Anxiety 04/04/2012  . OCD (obsessive compulsive disorder) 04/04/2012  . PTSD (post-traumatic stress disorder) 06/28/2011  . Coronary artery disease 08/26/2010  . GERD 03/01/2009  . Morbid obesity ([place]) 02/15/2009  . Hypothyroidism 02/12/2009    Past Medical History:  Diagnosis Date  . Allergy   . Anxiety   . Arthritis   . Asthma   . Bipolar 1 disorder ([place])   . CAD (coronary artery disease)   . Cellulitis   . CHF (congestive heart failure) ([facility])    diastolic dysfunction  . Chronic back pain   . Chronic headaches   . Complication of anesthesia    States she typically gets sick s/p anesthesia  . Contusion of sacrum   . COPD (chronic obstructive pulmonary disease) ([place])   . Dementia ([place])   . Depression   . Dyspnea    PFT 03/05/09 FEV1 2.77(98%), FVC 3.25(86%), FEV1% 85, TLC 5.88(99%), DLCO 60% ,  Methacholine challenge 03/16/09 normal ,  CT chest 03/12/09 no pulmonary disease  . Fungal infection   . GERD (gastroesophageal reflux disease)   . History of colonoscopy 10/17/2002   by Dr Rehman-> distal non-specific proctitis, small ext hemorrhoids,   . HTN (hypertension)   . Hyperlipidemia   . Hyperthyroidism    radioactive - thyroid cancer   . Migraine headache   . Morbid obesity with body mass index of 50.0-59.9 in adult [facility]) JAN 2011 370 LBS   2004 311 BMI 45.9  . Myocardial infarction ([place])    NOV 1997  . OSA on CPAP    she had been on 2L O2 at night but that was stopped  . PONV (postoperative nausea and vomiting)   . PTSD (post-traumatic stress disorder)   . PTSD (post-traumatic stress disorder)   . Suicidal ideation   . Urine incontinence   . Vitamin D deficiency     Past Surgical History:  Procedure Laterality Date  . ABDOMINAL HYSTERECTOMY     sept 1996  . APPENDECTOMY    . BACK SURGERY  2008  . CARDIAC CATHETERIZATION     nov 1997  . CHOLECYSTECTOMY     . COLONOSCOPY  10/17/2002    Distal proctitis, small external hemorrhoids, otherwise/  normal colonoscopy. Suspect rectal bleeding secondary to hemorrhoids  . ESOPHAGOGASTRODUODENOSCOPY  03/18/09   fundic gland polyps/mild gastritis  . HERNIA REPAIR  1978  . JOINT REPLACEMENT     bil knee replacement  . KNEE ARTHROSCOPY    . LEFT HEART CATH AND CORONARY ANGIOGRAPHY N/A 08/08/2018   Procedure: LEFT HEART CATH AND CORONARY ANGIOGRAPHY;  Surgeon: Jettie Booze, MD;  Location: [facility];  Service: Cardiovascular;  Laterality: N/A;  . MULTIPLE EXTRACTIONS WITH ALVEOLOPLASTY N/A 08/16/2015   Procedure: EXTRACTION OF TEETH THREE, SIX, EIGHT, NINE, ELEVEN, FOURTEEN, FIFTEEN, TWENTY-EIGHT WITH ALVEOLOPLASTY;  Surgeon: Diona Browner, DDS;  Location: [facility];  Service: Oral Surgery;  Laterality: N/A;  .  ROTATOR CUFF REPAIR Right   . TONSILLECTOMY    . TOTAL VAGINAL HYSTERECTOMY    . TUBAL LIGATION      Social History   Socioeconomic History  . Marital status: Single    Spouse name: Not on file  . Number of children: 2  . Years of education: Not on file  . Highest education level: Not on file  Occupational History  . 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 Smoking status: Never  . Smokeless tobacco: Never  Vaping Use  . Vaping Use: Never used  Substance and Sexual Activity  . Alcohol use: No    Alcohol/week: 0.0 standard drinks of alcohol  . Drug use: No  . Sexual activity: Never    Birth control/protection: Abstinence  Other Topics Concern  . Not on file  Social History Narrative   Ms.Stanis  is disabled and lives with her sister.    She has two grown sons that she does not see often, as they live in other states. She has 5 grand children.   She has a long history of mental illness including depression, PTSD, suicidal and homicidal ideation.   She has been obese most all of her life. Her weight has significantly impacted  her QOL. She recently lost 20 lbs. By decreasing portion size & increasing proteins.    Social Determinants of Health   Financial Resource Strain: Not on file  Food Insecurity: Not on file  Transportation Needs: Not on file  Physical Activity: Not on file  Stress: Not on file  Social Connections: Not on file  Intimate Partner Violence: Not on file    Family History  Adopted: Yes  Problem Relation Age of Onset  . Hypertension Mother   . Bipolar disorder Mother   . Dementia Mother   . Depression Mother   . AAA (abdominal aortic aneurysm) Mother   . Coronary artery disease Father   . Alcohol abuse Father   . Hypertension Brother   . Coronary artery disease Brother   . Bipolar disorder Brother   . Depression Brother   . Depression Sister   . Paranoid behavior Sister   . Cancer Sister        breast  . Bipolar disorder Sister   . Depression Sister   . Hypertension Sister   . Arthritis Sister        knee and hand   . Irregular heart beat Sister        takes eliquis   . Cancer Son        thyroid  . Drug abuse Son   . Alcohol abuse Son   . Cancer Maternal Grandfather        throat   . Cancer Maternal Aunt        breast metastatized to brain  . Anesthesia problems Neg Hx   . Hypotension Neg Hx   . Malignant hyperthermia Neg Hx   . Pseudochol deficiency Neg Hx      Review of Systems  Constitutional: Negative.  Negative for chills and fever.  HENT: Negative.  Negative for congestion and sore throat.   Respiratory: Negative.  Negative for cough and shortness of breath.   Cardiovascular: Negative.  Negative for chest pain and palpitations.  Gastrointestinal: Negative.  Negative for abdominal pain, diarrhea, nausea and vomiting.  Genitourinary: Negative.  Negative for dysuria and hematuria.  Skin: Negative.  Negative for rash.  Neurological:  Negative for dizziness and headaches.  All other systems reviewed and are negative.   Physical Exam Vitals reviewed.   Constitutional:      Appearance: Normal appearance. She is obese.  HENT:     Head: Normocephalic.     Mouth/Throat:     Mouth: Mucous membranes are moist.     Pharynx: Oropharynx is clear.  Eyes:     Extraocular Movements: Extraocular movements intact.     Conjunctiva/sclera: Conjunctivae normal.     Pupils: Pupils are equal, round, and reactive to light.  Cardiovascular:     Rate and Rhythm: Normal rate and regular rhythm.     Pulses: Normal pulses.     Heart sounds: Normal heart sounds.  Pulmonary:     Effort: Pulmonary effort is normal.     Breath sounds: Normal breath sounds.  Musculoskeletal:     Cervical back: No tenderness.     Right lower leg: Edema present.     Left lower leg: Edema present.     Comments: Lymphedema lower extremities  Lymphadenopathy:     Cervical: No cervical adenopathy.  Skin:    General: Skin is warm and dry.     Capillary Refill: Capillary refill takes less than 2 seconds.  Neurological:     General: No focal deficit present.     Mental Status: She is alert and oriented to person, place, and time.  Psychiatric:        Mood and Affect: Mood normal.        Behavior: Behavior normal.    ASSESSMENT & PLAN: ***   Agustina Caroli, MD [facility]

## 2022-01-19 DIAGNOSIS — Z8673 Personal history of transient ischemic attack (TIA), and cerebral infarction without residual deficits: Secondary | ICD-10-CM | POA: Insufficient documentation

## 2022-01-19 LAB — VALPROIC ACID LEVEL: Valproic Acid Lvl: 60.7 mg/L (ref 50.0–100.0)

## 2022-01-19 NOTE — Assessment & Plan Note (Signed)
Chronic and stable.   

## 2022-01-19 NOTE — Assessment & Plan Note (Signed)
Stable.  No recent anginal episodes. Current continue metoprolol succinate 25 mg daily. Sees cardiologist on a regular basis.

## 2022-01-19 NOTE — Assessment & Plan Note (Addendum)
Stable.  Continue daily Trelegy.

## 2022-01-19 NOTE — Assessment & Plan Note (Signed)
Well-controlled hypertension. BP Readings from Last 3 Encounters:  01/18/22 126/88  07/14/21 (!) 143/95  09/02/20 (!) 135/91  Continue metoprolol succinate 25 mg daily and Lasix 20 mg daily.

## 2022-01-19 NOTE — Assessment & Plan Note (Signed)
No signs or symptoms of acute CHF. Continue Lasix 20 mg daily.

## 2022-01-19 NOTE — Assessment & Plan Note (Signed)
Diet and nutrition discussed.  Advised to decrease amount of daily carbohydrate intake and daily calories and increase amount of plant-based protein in her diet. 

## 2022-01-19 NOTE — Assessment & Plan Note (Signed)
Stable on CPAP 

## 2022-01-19 NOTE — Assessment & Plan Note (Signed)
Clinically euthyroid. Continue Synthroid 125 mcg daily.

## 2022-01-19 NOTE — Assessment & Plan Note (Signed)
Stable. Continue Eliquis 5mg daily

## 2022-01-19 NOTE — Assessment & Plan Note (Signed)
Stable.  Sees psychiatrist on a regular basis. Medications handled by them.

## 2022-01-30 NOTE — Progress Notes (Deleted)
Cardiology Office Note   Date:  01/30/2022   ID:  Allison Sharp, DOB 18-Nov-1955, MRN [id number]  PCP:  Horald Pollen, MD  Cardiologist:   Dorris Carnes, MD       History of Present Illness: Allison Sharp is a 66 y.o. female with a history of chest pressure, palpitations, PAF (CHADS2VASc of 3), HTN ,  orthostatic hypotension. HL Cath in 2020 showed no significant CAD   The pt was hospitalized  in Aug 2022 with orthostatic hypotension and syncope   [name] and passed out  Clonidine and lasix stopped.  Echo showed normal biventricular function   Sent home with event monitor  This showed atrial fibrillation as well as NSVT  After D/c she was seen in cardiology clinic at [facility] in Aug    Recomm a PET CT perfusion scan due to size.    Monitor showed atrial fibrillation.  Recomm Eliquis and Toprol XL    I saw the pt in clinic in Feb 2023    No outpatient medications have been marked as taking for the 01/31/22 encounter (Appointment) with Fay Records, MD.     Allergies:   Abilify [aripiprazole], Naproxen, Ativan [lorazepam], Haldol [haloperidol], and Hydroxyzine   Past Medical History:  Diagnosis Date   Allergy    Anxiety    Arthritis    Asthma    Bipolar 1 disorder ([place])    CAD (coronary artery disease)    Cellulitis    CHF (congestive heart failure) (HCC)    diastolic dysfunction   Chronic back pain    Chronic headaches    Complication of anesthesia    States she typically gets sick s/p anesthesia   Contusion of sacrum    COPD (chronic obstructive pulmonary disease) ([place])    Dementia (HCC)    Depression    Dyspnea    PFT 03/05/09 FEV1 2.77(98%), FVC 3.25(86%), FEV1% 85, TLC 5.88(99%), DLCO 60% ,  Methacholine challenge 03/16/09 normal ,  CT chest 03/12/09 no pulmonary disease   Fungal infection    GERD (gastroesophageal reflux disease)    History of colonoscopy 10/17/2002   by Dr Laural Golden distal non-specific proctitis, small ext  hemorrhoids,    HTN (hypertension)    Hyperlipidemia    Hyperthyroidism    radioactive - thyroid cancer    Migraine headache    Morbid obesity with body mass index of 50.0-59.9 in adult [facility]) JAN 2011 370 LBS   2004 311 BMI 45.9   Myocardial infarction ([place])    NOV 1997   OSA on CPAP    she had been on 2L O2 at night but that was stopped   PONV (postoperative nausea and vomiting)    PTSD (post-traumatic stress disorder)    PTSD (post-traumatic stress disorder)    Suicidal ideation    Urine incontinence    Vitamin D deficiency     Past Surgical History:  Procedure Laterality Date   ABDOMINAL HYSTERECTOMY     sept 1996   APPENDECTOMY     BACK SURGERY  2008   CARDIAC CATHETERIZATION     nov 1997   CHOLECYSTECTOMY     COLONOSCOPY  10/17/2002    Distal proctitis, small external hemorrhoids, otherwise/  normal colonoscopy. Suspect rectal bleeding secondary to hemorrhoids   ESOPHAGOGASTRODUODENOSCOPY  03/18/09   fundic gland polyps/mild gastritis   HERNIA REPAIR  1978   JOINT REPLACEMENT     bil knee replacement  KNEE ARTHROSCOPY     LEFT HEART CATH AND CORONARY ANGIOGRAPHY N/A 08/08/2018   Procedure: LEFT HEART CATH AND CORONARY ANGIOGRAPHY;  Surgeon: Jettie Booze, MD;  Location: [place] [facility];  Service: Cardiovascular;  Laterality: N/A;   MULTIPLE EXTRACTIONS WITH ALVEOLOPLASTY N/A 08/16/2015   Procedure: EXTRACTION OF TEETH THREE, SIX, EIGHT, NINE, ELEVEN, FOURTEEN, FIFTEEN, TWENTY-EIGHT WITH ALVEOLOPLASTY;  Surgeon: Diona Browner, DDS;  Location: [facility];  Service: Oral Surgery;  Laterality: N/A;   ROTATOR CUFF REPAIR Right    TONSILLECTOMY     TOTAL VAGINAL HYSTERECTOMY     TUBAL LIGATION       Social History:  The patient  reports that she has never smoked. She has never used smokeless tobacco. She reports that she does not drink alcohol and does not use drugs.   Family History:  The patient's family history includes AAA (abdominal aortic aneurysm) in her  mother; Alcohol abuse in her father and son; Arthritis in her sister; Bipolar disorder in her brother, mother, and sister; Cancer in her maternal aunt, maternal grandfather, sister, and son; Coronary artery disease in her brother and father; Dementia in her mother; Depression in her brother, mother, sister, and sister; Drug abuse in her son; Hypertension in her brother, mother, and sister; Irregular heart beat in her sister; Paranoid behavior in her sister. She was adopted.    ROS:  Please see the history of present illness. All other systems are reviewed and  Negative to the above problem except as noted.    PHYSICAL EXAM: VS:  LMP 02/18/1995   GEN: Morbidly obese 66 yo in no acute distress  HEENT: normal  Neck: no JVD.  No carotid bruits Cardiac: RRR; no murmurs  No LE  edema  Respiratory:  clear to auscultation bilaterally, GI: soft, nontender, nondistended, + BS  No hepatomegaly  MS: no deformity Moving all extremities   Skin: warm and dry, no rash Neuro:  Strength and sensation are intact Psych: euthymic mood, full affect   EKG:  EKG is ordered today.  SR 82 bpm  Nonspecific ST changes    L Heart cath   08/08/18  Mid LAD lesion is 10% stenosed. The left ventricular systolic function is normal. LV end diastolic pressure is normal. The left ventricular ejection fraction is 55-65% by visual estimate. There is no aortic valve stenosis.   No significant CAD.   Continue preventive therapy.  Lipid Panel    Component Value Date/Time   CHOL 155 07/14/2020 1007   TRIG 191 (H) 07/14/2020 1007   HDL 38 (L) 07/14/2020 1007   CHOLHDL 4.1 07/14/2020 1007   CHOLHDL 4.3 08/08/2018 0548   VLDL 23 08/08/2018 0548   LDLCALC 84 07/14/2020 1007      Wt Readings from Last 3 Encounters:  01/18/22 (!) 341 lb (154.7 kg)  07/14/21 (!) 343 lb (155.6 kg)  08/26/20 (!) 345 lb 12.8 oz (156.9 kg)      ASSESSMENT AND PLAN:  1   Chest pressure  Patient denies     I would follow   Min CAD at  cath  2   Dizziness/ sycnope   Currently doing OK on current regimen   No dizziness   Will check labs  Encouraged her to stay hydrated  3  PAF   Keep on current regimen of metoprolol and eliquis     4  HTN   BP is a little high today   with hx of syncope I would recomm  she follow      5  HL  Continue lipitor      Current medicines are reviewed at length with the patient today.  The patient does not have concerns regarding medicines.  Signed, Dorris Carnes, MD  01/30/2022 8:16 PM    [facility] [place], [place], [place]  [address] Phone: [phone]; Fax: [phone]

## 2022-01-31 ENCOUNTER — Ambulatory Visit: Payer: Medicare Other | Admitting: Internal Medicine

## 2022-02-17 ENCOUNTER — Other Ambulatory Visit (HOSPITAL_COMMUNITY): Payer: Self-pay | Admitting: Psychiatry

## 2022-02-17 DIAGNOSIS — F411 Generalized anxiety disorder: Secondary | ICD-10-CM

## 2022-02-17 DIAGNOSIS — F3160 Bipolar disorder, current episode mixed, unspecified: Secondary | ICD-10-CM

## 2022-02-20 ENCOUNTER — Telehealth (HOSPITAL_COMMUNITY): Payer: Self-pay

## 2022-02-20 ENCOUNTER — Other Ambulatory Visit (HOSPITAL_COMMUNITY): Payer: Self-pay | Admitting: Psychiatry

## 2022-02-20 DIAGNOSIS — F411 Generalized anxiety disorder: Secondary | ICD-10-CM

## 2022-02-20 DIAGNOSIS — F3160 Bipolar disorder, current episode mixed, unspecified: Secondary | ICD-10-CM

## 2022-02-20 MED ORDER — DULOXETINE HCL 60 MG PO CPEP: 60.0000 mg | ORAL_CAPSULE | Freq: Every morning | ORAL | 2 refills | Status: DC

## 2022-02-20 NOTE — Telephone Encounter (Signed)
sent 

## 2022-02-20 NOTE — Telephone Encounter (Signed)
Refill request from pharmacyfor Duloxetine  last appt 11/24/21 next appt 9/28/231    Disp Refills Start End   DULoxetine (CYMBALTA) 60 MG capsule 30 capsule 2 11/24/2021    Sig - Route: Take 1 capsule (60 mg total) by mouth in the morning. - Oral   Sent to pharmacy as: DULoxetine (CYMBALTA) 60 MG capsule   E-Prescribing Status: Receipt confirmed by pharmacy (11/24/2021  3:13 PM EDT)

## 2022-02-23 ENCOUNTER — Telehealth (HOSPITAL_BASED_OUTPATIENT_CLINIC_OR_DEPARTMENT_OTHER): Payer: Medicare Other | Admitting: Psychiatry

## 2022-02-23 ENCOUNTER — Encounter (HOSPITAL_COMMUNITY): Payer: Self-pay | Admitting: Psychiatry

## 2022-02-23 DIAGNOSIS — F3160 Bipolar disorder, current episode mixed, unspecified: Secondary | ICD-10-CM | POA: Diagnosis not present

## 2022-02-23 DIAGNOSIS — F411 Generalized anxiety disorder: Secondary | ICD-10-CM | POA: Diagnosis not present

## 2022-02-23 DIAGNOSIS — F5105 Insomnia due to other mental disorder: Secondary | ICD-10-CM | POA: Diagnosis not present

## 2022-02-23 MED ORDER — RISPERIDONE 4 MG PO TABS: 4.0000 mg | ORAL_TABLET | Freq: Every day | ORAL | 2 refills | Status: DC

## 2022-02-23 MED ORDER — DIVALPROEX SODIUM 500 MG PO DR TAB: 1000.0000 mg | DELAYED_RELEASE_TABLET | Freq: Every day | ORAL | 2 refills | Status: DC

## 2022-02-23 MED ORDER — DULOXETINE HCL 60 MG PO CPEP: 60.0000 mg | ORAL_CAPSULE | Freq: Every morning | ORAL | 2 refills | Status: DC

## 2022-02-23 MED ORDER — TRAZODONE HCL 100 MG PO TABS: 100.0000 mg | ORAL_TABLET | Freq: Every day | ORAL | 2 refills | Status: DC

## 2022-02-23 NOTE — Progress Notes (Signed)
Virtual Visit via Telephone Note  I connected with Allison Sharp on 02/23/22 at  2:20 PM EDT by telephone and verified that I am speaking with the correct person using two identifiers.  Location: Patient: Home Provider: Office   I discussed the limitations, risks, security and privacy concerns of performing an evaluation and management service by telephone and the availability of in person appointments. I also discussed with the patient that there may be a patient responsible charge related to this service. The patient expressed understanding and agreed to proceed.   History of Present Illness: Patient is evaluated by phone session.  She reported there are nights when she does not sleep as good and she has a lot of racing thoughts.  She is taking trazodone 50 mg at bedtime.  She denies any crying spells but admitted having ruminative thoughts and she thinks a lot about her past.  Her living situation is very well.  She and her sister gets along very well and now together they are going to [place] to visit their family member for 4 days.  Patient excited about the trip.  She denies any dizziness, fall, suicidal thoughts.  Her tremors are much better but is still have chronic and comes on and off.  Patient does not drive and does not go outside.  Sometimes she feels bored and needs someone to talk.  We have blood work done by his PCP for Depakote level.  Her level is 60 and therapeutic.  Patient compliant with Depakote, Risperdal, Cymbalta and trazodone.  She is trying to lose weight and she had lost another 10 pounds since the last visit.  She is watching her calorie intake.  She denies any mania, psychosis or any impulsive behavior.  She tried to stay home as gets very anxious around people.  Past Psychiatric History: Reviewed. H/O multiple inpatient. Last admissions in May 2020 at [facility]. H/O suicidal attempt by taking overdose.  Diagnosed with PTSD, depression, bipolar disorder.   Tried Paxil, Prozac, Wellbutrin, Effexor, Lexapro, amitriptyline, Cymbalta, Abilify (Tremors), Neurontin, trazodone, Thorazine, hydroxyzine, Luvox, Sinequan, Zyprexa, Ambien and Latuda.  Best responded on Risperdal and Cymbalta.    Recent Results (from the past 2160 hour(s))  Valproic Acid level     Status: None   Collection Time: 01/18/22  2:37 PM  Result Value Ref Range   Valproic Acid Lvl 60.7 50.0 - 100.0 mg/L     Psychiatric Specialty Exam: Physical Exam  Review of Systems  Weight (!) 340 lb (154.2 kg), last menstrual period 02/18/1995.There is no height or weight on file to calculate BMI.  General Appearance: NA  Eye Contact:  NA  Speech:  Slow  Volume:  Decreased  Mood:  Anxious  Affect:  NA  Thought Process:  Descriptions of Associations: Intact  Orientation:  Full (Time, Place, and Person)  Thought Content:  Rumination  Suicidal Thoughts:  No  Homicidal Thoughts:  No  Memory:  Immediate;   Fair Recent;   Fair Remote;   Fair  Judgement:  Fair  Insight:  Shallow  Psychomotor Activity:  NA  Concentration:  Concentration: Fair and Attention Span: Fair  Recall:  [employer] of Knowledge:  Fair  Language:  Fair  Akathisia:  No  Handed:  Right  AIMS (if indicated):     Assets:  Communication Skills Desire for Improvement Housing Social Support  ADL's:  Intact  Cognition:  WNL  Sleep:   fair      Assessment and Plan:  Bipolar disorder type I.  Generalized anxiety disorder.  Insomnia due to mental disorder.  I reviewed blood work results.  Her Depakote level is therapeutic.  She endorsed some time ruminative thoughts and poor sleep.  She feels boredom and I recommend should consider seeing a therapist for coping and social skills.  She lives in [place] and difficult to come for therapy visits but like to try virtual therapy visits.  We will give her names of the therapist who can do virtual.  I will increase trazodone 100 mg at bedtime to help her insomnia.   Continue Depakote 1000 mg at bedtime, Cymbalta 60 mg daily and Risperdal 4 mg at bedtime.  Recommended to call us back if she has any question or any concern.  Follow-up in 3 months.  I offered if she is interested to have next visit in person if she needed.  Follow Up Instructions:    I discussed the assessment and treatment plan with the patient. The patient was provided an opportunity to ask questions and all were answered. The patient agreed with the plan and demonstrated an understanding of the instructions.   The patient was advised to call back or seek an in-person evaluation if the symptoms worsen or if the condition fails to improve as anticipated.  Collaboration of Care: Primary Care Provider AEB notes are available in epic to review.  Patient/Guardian was advised Release of Information must be obtained prior to any record release in order to collaborate their care with an outside provider. Patient/Guardian was advised if they have not already done so to contact the registration department to sign all necessary forms in order for [name] to release information regarding their care.   Consent: Patient/Guardian gives verbal consent for treatment and assignment of benefits for services provided during this visit. Patient/Guardian expressed understanding and agreed to proceed.    I provided 25 minutes of non-face-to-face time during this encounter.   Kathlee Nations, MD

## 2022-03-23 ENCOUNTER — Ambulatory Visit (INDEPENDENT_AMBULATORY_CARE_PROVIDER_SITE_OTHER): Payer: Medicare Other | Admitting: Clinical

## 2022-03-23 ENCOUNTER — Encounter (HOSPITAL_COMMUNITY): Payer: Self-pay

## 2022-03-23 DIAGNOSIS — F4310 Post-traumatic stress disorder, unspecified: Secondary | ICD-10-CM | POA: Diagnosis not present

## 2022-03-23 DIAGNOSIS — F315 Bipolar disorder, current episode depressed, severe, with psychotic features: Secondary | ICD-10-CM | POA: Diagnosis not present
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
## 2022-03-23 NOTE — Plan of Care (Signed)
Verbal Consent 

## 2022-04-06 ENCOUNTER — Other Ambulatory Visit (HOSPITAL_COMMUNITY): Payer: Self-pay | Admitting: Psychiatry

## 2022-04-19 ENCOUNTER — Telehealth: Payer: Self-pay | Admitting: Internal Medicine

## 2022-04-19 DIAGNOSIS — G4733 Obstructive sleep apnea (adult) (pediatric): Secondary | ICD-10-CM

## 2022-04-19 DIAGNOSIS — J449 Chronic obstructive pulmonary disease, unspecified: Secondary | ICD-10-CM | POA: Diagnosis not present

## 2022-04-19 DIAGNOSIS — I10 Essential (primary) hypertension: Secondary | ICD-10-CM

## 2022-04-19 NOTE — Telephone Encounter (Signed)
Patient has been advised to call [facility] and ask for a loaner machine and at her appointment with Dr Harrington Challenger she should ask Dr Harrington Challenger to note she was using and benefiting from her machine until it broke. Patient is agreeable to treatment.

## 2022-04-19 NOTE — Telephone Encounter (Signed)
Patient states her CPAP broke and this morning she contacted [facility]. They advised her that she is eligible for a new CPAP and she would like to have it ordered. She also assumes a she will need to have another sleep study soon.

## 2022-04-19 NOTE — Addendum Note (Signed)
Addended by: Freada Bergeron on: 04/19/2022 02:17 PM   Modules accepted: Orders

## 2022-04-25 ENCOUNTER — Ambulatory Visit (INDEPENDENT_AMBULATORY_CARE_PROVIDER_SITE_OTHER): Payer: Medicare Other | Admitting: Clinical

## 2022-04-25 DIAGNOSIS — F4310 Post-traumatic stress disorder, unspecified: Secondary | ICD-10-CM | POA: Diagnosis not present

## 2022-04-25 DIAGNOSIS — F315 Bipolar disorder, current episode depressed, severe, with psychotic features: Secondary | ICD-10-CM

## 2022-04-25 NOTE — Progress Notes (Deleted)
Cardiology Office Note   Date:  04/25/2022   ID:  CLARITY CISZEK, DOB 1956-05-20, MRN [id number]  PCP:  Horald Pollen, MD  Cardiologist:   Dorris Carnes, MD       History of Present Illness: Allison Sharp is a 66 y.o. female with a history of chest pressure, palpitations, PAF (CHADS2VASc of 3), HTN ,  orthostatic hypotension. HL Cath in 2020 showed no significant CAD   The pt was hospitalized  in Aug 2022 with orthostatic hypotension and syncope   [name] and passed out  Clonidine and lasix stopped.  Echo showed normal biventricular function   Sent home with event monitor  This showed atrial fibrillation as well as NSVT  After D/c she was seen in cardiology clinic at [facility] in Aug    Recomm a PET CT perfusion scan due to size.    Monitor showed atrial fibrillation.  Recomm Eliquis and Toprol XL    The pt denies dizziness.   Breathing is OK   Denies palpitations      Says she is feeling better   No CP    I saw he pt in Feb 2023    No outpatient medications have been marked as taking for the 04/26/22 encounter (Appointment) with Fay Records, MD.     Allergies:   Abilify [aripiprazole], Naproxen, Ativan [lorazepam], Haldol [haloperidol], and Hydroxyzine   Past Medical History:  Diagnosis Date   Allergy    Anxiety    Arthritis    Asthma    Bipolar 1 disorder ([place])    CAD (coronary artery disease)    Cellulitis    CHF (congestive heart failure) (HCC)    diastolic dysfunction   Chronic back pain    Chronic headaches    Complication of anesthesia    States she typically gets sick s/p anesthesia   Contusion of sacrum    COPD (chronic obstructive pulmonary disease) ([place])    Dementia (HCC)    Depression    Dyspnea    PFT 03/05/09 FEV1 2.77(98%), FVC 3.25(86%), FEV1% 85, TLC 5.88(99%), DLCO 60% ,  Methacholine challenge 03/16/09 normal ,  CT chest 03/12/09 no pulmonary disease   Fungal infection    GERD (gastroesophageal reflux disease)     History of colonoscopy 10/17/2002   by Dr Laural Golden distal non-specific proctitis, small ext hemorrhoids,    HTN (hypertension)    Hyperlipidemia    Hyperthyroidism    radioactive - thyroid cancer    Migraine headache    Morbid obesity with body mass index of 50.0-59.9 in adult [facility]) JAN 2011 370 LBS   2004 311 BMI 45.9   Myocardial infarction ([place])    NOV 1997   OSA on CPAP    she had been on 2L O2 at night but that was stopped   PONV (postoperative nausea and vomiting)    PTSD (post-traumatic stress disorder)    PTSD (post-traumatic stress disorder)    Suicidal ideation    Urine incontinence    Vitamin D deficiency     Past Surgical History:  Procedure Laterality Date   ABDOMINAL HYSTERECTOMY     sept 1996   APPENDECTOMY     BACK SURGERY  2008   CARDIAC CATHETERIZATION     nov 1997   CHOLECYSTECTOMY     COLONOSCOPY  10/17/2002    Distal proctitis, small external hemorrhoids, otherwise/  normal colonoscopy. Suspect rectal bleeding secondary to hemorrhoids  ESOPHAGOGASTRODUODENOSCOPY  03/18/09   fundic gland polyps/mild gastritis   HERNIA REPAIR  1978   JOINT REPLACEMENT     bil knee replacement   KNEE ARTHROSCOPY     LEFT HEART CATH AND CORONARY ANGIOGRAPHY N/A 08/08/2018   Procedure: LEFT HEART CATH AND CORONARY ANGIOGRAPHY;  Surgeon: Jettie Booze, MD;  Location: [facility];  Service: Cardiovascular;  Laterality: N/A;   MULTIPLE EXTRACTIONS WITH ALVEOLOPLASTY N/A 08/16/2015   Procedure: EXTRACTION OF TEETH THREE, SIX, EIGHT, NINE, ELEVEN, FOURTEEN, FIFTEEN, TWENTY-EIGHT WITH ALVEOLOPLASTY;  Surgeon: Diona Browner, DDS;  Location: [facility];  Service: Oral Surgery;  Laterality: N/A;   ROTATOR CUFF REPAIR Right    TONSILLECTOMY     TOTAL VAGINAL HYSTERECTOMY     TUBAL LIGATION       Social History:  The patient  reports that she has never smoked. She has never used smokeless tobacco. She reports that she does not drink alcohol and does not use drugs.    Family History:  The patient's family history includes AAA (abdominal aortic aneurysm) in her mother; Alcohol abuse in her father and son; Arthritis in her sister; Bipolar disorder in her brother, mother, and sister; Cancer in her maternal aunt, maternal grandfather, sister, and son; Coronary artery disease in her brother and father; Dementia in her mother; Depression in her brother, mother, sister, and sister; Drug abuse in her son; Hypertension in her brother, mother, and sister; Irregular heart beat in her sister; Paranoid behavior in her sister. She was adopted.    ROS:  Please see the history of present illness. All other systems are reviewed and  Negative to the above problem except as noted.    PHYSICAL EXAM: VS:  LMP 02/18/1995   GEN: Morbidly obese 66 yo in no acute distress  HEENT: normal  Neck: no JVD.  No carotid bruits Cardiac: RRR; no murmurs  No LE  edema  Respiratory:  clear to auscultation bilaterally, GI: soft, nontender, nondistended, + BS  No hepatomegaly  MS: no deformity Moving all extremities   Skin: warm and dry, no rash Neuro:  Strength and sensation are intact Psych: euthymic mood, full affect   EKG:  EKG is ordered today.  SR 82 bpm  Nonspecific ST changes    L Heart cath   08/08/18  Mid LAD lesion is 10% stenosed. The left ventricular systolic function is normal. LV end diastolic pressure is normal. The left ventricular ejection fraction is 55-65% by visual estimate. There is no aortic valve stenosis.   No significant CAD.   Continue preventive therapy.  Lipid Panel    Component Value Date/Time   CHOL 155 07/14/2020 1007   TRIG 191 (H) 07/14/2020 1007   HDL 38 (L) 07/14/2020 1007   CHOLHDL 4.1 07/14/2020 1007   CHOLHDL 4.3 08/08/2018 0548   VLDL 23 08/08/2018 0548   LDLCALC 84 07/14/2020 1007      Wt Readings from Last 3 Encounters:  01/18/22 (!) 341 lb (154.7 kg)  07/14/21 (!) 343 lb (155.6 kg)  08/26/20 (!) 345 lb 12.8 oz (156.9 kg)       ASSESSMENT AND PLAN:  1   Chest pressure  Patient denies     I would follow   Min CAD at cath  2   Dizziness/ sycnope   Currently doing OK on current regimen   No dizziness   Will check labs  Encouraged her to stay hydrated  3  PAF   Keep on current regimen  of metoprolol and eliquis     4  HTN   BP is a little high today   with hx of syncope I would recomm she follow      5  HL  Continue lipitor      Current medicines are reviewed at length with the patient today.  The patient does not have concerns regarding medicines.  Signed, Dorris Carnes, MD  04/25/2022 6:00 PM    [address], [place], [place]  [id number] Phone: [phone]; Fax: [phone]

## 2022-04-25 NOTE — Progress Notes (Signed)
Virtual Visit via Telephone Note  I connected with Allison Sharp on 04/25/22 at  1:00 PM EST by telephone and verified that I am speaking with the correct person using two identifiers.  Location: Patient: Home Provider: Office   I discussed the limitations, risks, security and privacy concerns of performing an evaluation and management service by telephone and the availability of in person appointments. I also discussed with the patient that there may be a patient responsible charge related to this service. The patient expressed understanding and agreed to proceed.   THERAPY PROGRESS NOTE   Session Time:  1:00 PM-1:30 PM   Participation Level: Active   Behavioral Response: CasualAlertDepressed   Type of Therapy: Individual Therapy   Treatment Goals addressed: Coping   Interventions: CBT, Motivational Interviewing, Strength-based and Supportive   Summary: Allison Sharp is a 66 y.o. female who presents with PTSD and Bi-Polar Disorder.The OPT therapist worked with the patient for her OPT treatment. The OPT therapist utilized Motivational Interviewing to assist in creating therapeutic repore. The patient in the session was engaged and work in collaboration giving feedback about her triggers and symptoms over the past few weeks.The patient spoke about recent trip to [place] to visit family as a mini vacation, as well as reviewed her recent Thanksgiving holiday.The patient spoke about plans for the upcoming Christmas holiday .The patient worked with the [place] therapist on coping skills for in home. The patient spoke about social interactions with the next door neighbors who join them for holidays and are around the same age as the patient. The OPT therapist overviewed the upcoming appointments for the patient as listed in the patients MyChart.     Suicidal/Homicidal: Nowithout intent/plan   Therapist Response: The OPT therapist worked with the patient for the patients scheduled session.  The patient was engaged in her session and gave feedback in relation to triggers, symptoms, and behavior responses over the past few weeks. The patient reviewed her recent vacation in visiting family in [place] since her last session as well as celebrating Thanksgiving at home with neighbors/friends. The patient spoke about her interactions with family and friends and her plans for the upcoming  Christmas holiday. The OPT therapist worked with the patient utilizing an in session Cognitive Behavioral Therapy exercise. The patient was responsive in the session and verbalized, " I had such a wonderful time in [place] with my sister and we are planning on going back to visit again in the Spring  ".The OPT therapist provided support, encouragement and worked with the patient on maintaining positive thinking and reviewed the patients adjustment in season change and time change adjustment. The patient verbalized feeling she is adjusting well with the time change and has got into rhythm with her sleep cycle .The OPT therapist will continue treatment work with the patient in her next scheduled session.   Plan: Return again 2/3 weeks   Diagnosis:      Axis I: PTSD/ Bi-Polar I Disorder                         Axis II: No diagnosis   I discussed the assessment and treatment plan with the patient. The patient was provided an opportunity to ask questions and all were answered. The patient agreed with the plan and demonstrated an understanding of the instructions.   The patient was advised to call back or seek an in-person evaluation if the symptoms worsen or if the condition fails to  improve as anticipated.   I provided 30 minutes of non-face-to-face time during this encounter.    Lennox Grumbles, LCSW   04/25/2022

## 2022-04-26 ENCOUNTER — Ambulatory Visit: Payer: Medicare Other | Admitting: Internal Medicine

## 2022-04-27 ENCOUNTER — Other Ambulatory Visit: Payer: Self-pay | Admitting: Emergency Medicine

## 2022-05-04 ENCOUNTER — Encounter (HOSPITAL_COMMUNITY): Payer: Self-pay | Admitting: Psychiatry

## 2022-05-04 ENCOUNTER — Telehealth (HOSPITAL_BASED_OUTPATIENT_CLINIC_OR_DEPARTMENT_OTHER): Payer: Medicare Other | Admitting: Psychiatry

## 2022-05-04 DIAGNOSIS — F411 Generalized anxiety disorder: Secondary | ICD-10-CM

## 2022-05-04 DIAGNOSIS — F3160 Bipolar disorder, current episode mixed, unspecified: Secondary | ICD-10-CM | POA: Diagnosis not present

## 2022-05-04 DIAGNOSIS — F5105 Insomnia due to other mental disorder: Secondary | ICD-10-CM

## 2022-05-04 MED ORDER — TRAZODONE HCL 100 MG PO TABS: 100.0000 mg | ORAL_TABLET | Freq: Every day | ORAL | 2 refills | Status: DC

## 2022-05-04 MED ORDER — RISPERIDONE 4 MG PO TABS: 4.0000 mg | ORAL_TABLET | Freq: Every day | ORAL | 2 refills | Status: DC

## 2022-05-04 MED ORDER — DULOXETINE HCL 60 MG PO CPEP: 60.0000 mg | ORAL_CAPSULE | Freq: Every morning | ORAL | 2 refills | Status: DC

## 2022-05-04 MED ORDER — DIVALPROEX SODIUM 500 MG PO DR TAB: 1000.0000 mg | DELAYED_RELEASE_TABLET | Freq: Every day | ORAL | 2 refills | Status: DC

## 2022-05-04 NOTE — Progress Notes (Signed)
Virtual Visit via Telephone Note  I connected with Allison Sharp on 05/04/22 at  3:00 PM EST by telephone and verified that I am speaking with the correct person using two identifiers.  Location: Patient: Home Provider: Office   I discussed the limitations, risks, security and privacy concerns of performing an evaluation and management service by telephone and the availability of in person appointments. I also discussed with the patient that there may be a patient responsible charge related to this service. The patient expressed understanding and agreed to proceed.   History of Present Illness: Patient is evaluated by phone session.  On the last visit we increased trazodone and she is now taking 100 mg.  She shown marginal improvement but is still struggle staying in sleep.  Overall she reported her mood is good.  Recently she had a visit to [place] to visit her sister 1 week before Thanksgiving.  Patient reported visit was very good.  She denies any mania, psychosis or any hallucination.  She denies any tremors or shakes.  She has mild tremors but they are under control.  She is getting along with her sister much better than she anticipated.  Her energy level is good.  She does not drive and stays home.  She has some anxiety but under control.  She uses CPAP but she feels machine may not be working as good and she recently talked to her doctor about getting another sleep study but it was declined.   Past Psychiatric History: Reviewed. H/O multiple inpatient. Last admissions in May 2020 at [facility]. H/O suicidal attempt by taking overdose.  Diagnosed with PTSD, depression, bipolar disorder.  Tried Paxil, Prozac, Wellbutrin, Effexor, Lexapro, amitriptyline, Cymbalta, Abilify (Tremors), Neurontin, trazodone, Thorazine, hydroxyzine, Luvox, Sinequan, Zyprexa, Ambien and Latuda.  Best responded on Risperdal and Cymbalta.    Psychiatric Specialty Exam: Physical Exam  Review of  Systems  Weight (!) 340 lb (154.2 kg), last menstrual period 02/18/1995.There is no height or weight on file to calculate BMI.  General Appearance: NA  Eye Contact:  NA  Speech:  Slow  Volume:  Decreased  Mood:  Euthymic  Affect:  NA  Thought Process:  Descriptions of Associations: Intact  Orientation:  Full (Time, Place, and Person)  Thought Content:  Rumination  Suicidal Thoughts:  No  Homicidal Thoughts:  No  Memory:  Immediate;   Fair Recent;   Fair Remote;   Fair  Judgement:  Intact  Insight:  Shallow  Psychomotor Activity:  NA  Concentration:  Concentration: Fair and Attention Span: Fair  Recall:  [employer] of Knowledge:  Fair  Language:  Good  Akathisia:  No  Handed:  Right  AIMS (if indicated):     Assets:  Communication Skills Desire for Improvement Housing Social Support  ADL's:  Intact  Cognition:  WNL  Sleep:   5 hrs, uses CPAP      Assessment and Plan: Bipolar disorder type I.  Generalized anxiety disorder.  Insomnia due to mental disorder.  Patient noticed despite increase trazodone marginal improvement in her sleep.  I encouraged should contact the sleep doctor and discussed that she is not sleeping well despite increased trazodone.  She recalled her last sleep study was 17 years ago.  I encourage provided these information to the doctor to see if they can do another sleep study or adjustment of the CPAP.  Overall patient doing well.  She started therapy with social worker and that is going well.  Continue Depakote 1000 mg at bedtime, Cymbalta 60 mg daily, trazodone 4 mg at bedtime and we will keep the trazodone 100 mg at bedtime.  Higher dose in the past has caused dizziness and constipation.  Her last Depakote level was 60 which was done 3 months ago.  Recommended to call us back if she has any question or any concern.  Follow-up in 3 months.  Follow Up Instructions:    I discussed the assessment and treatment plan with the patient. The patient was  provided an opportunity to ask questions and all were answered. The patient agreed with the plan and demonstrated an understanding of the instructions.   The patient was advised to call back or seek an in-person evaluation if the symptoms worsen or if the condition fails to improve as anticipated.  Collaboration of Care: Other provider involved in patient's care AEB notes are available in epic to review.  Patient/Guardian was advised Release of Information must be obtained prior to any record release in order to collaborate their care with an outside provider. Patient/Guardian was advised if they have not already done so to contact the registration department to sign all necessary forms in order for [name] to release information regarding their care.   Consent: Patient/Guardian gives verbal consent for treatment and assignment of benefits for services provided during this visit. Patient/Guardian expressed understanding and agreed to proceed.    I provided 12 minutes of non-face-to-face time during this encounter.   Kathlee Nations, MD

## 2022-05-23 ENCOUNTER — Ambulatory Visit (HOSPITAL_COMMUNITY): Payer: Medicare Other | Admitting: Clinical

## 2022-06-04 NOTE — Progress Notes (Deleted)
Cardiology Office Note   Date:  06/04/2022   ID:  Allison Sharp, DOB 10-10-1955, MRN [id number]  PCP:  Horald Pollen, MD  Cardiologist:   Dorris Carnes, MD       History of Present Illness: Allison Sharp is a 67 y.o. female with a history of chest pressure, palpitations, PAF (CHADS2VASc of 3), HTN ,  orthostatic hypotension. HL Cath in 2020 showed no significant CAD   The pt was hospitalized  in Aug 2022 with orthostatic hypotension and syncope   [name] and passed out  Clonidine and lasix stopped.  Echo showed normal biventricular function   Sent home with event monitor  This showed atrial fibrillation as well as NSVT  After D/c she was seen in cardiology clinic at [facility] in Aug    Recomm a PET CT perfusion scan due to size.    Monitor showed atrial fibrillation.  Recomm Eliquis and Toprol XL    I saw the pt in clinic in Feb 2023  No outpatient medications have been marked as taking for the 06/05/22 encounter (Appointment) with Fay Records, MD.     Allergies:   Abilify [aripiprazole], Naproxen, Ativan [lorazepam], Haldol [haloperidol], and Hydroxyzine   Past Medical History:  Diagnosis Date   Allergy    Anxiety    Arthritis    Asthma    Bipolar 1 disorder ([place])    CAD (coronary artery disease)    Cellulitis    CHF (congestive heart failure) (HCC)    diastolic dysfunction   Chronic back pain    Chronic headaches    Complication of anesthesia    States she typically gets sick s/p anesthesia   Contusion of sacrum    COPD (chronic obstructive pulmonary disease) ([place])    Dementia (HCC)    Depression    Dyspnea    PFT 03/05/09 FEV1 2.77(98%), FVC 3.25(86%), FEV1% 85, TLC 5.88(99%), DLCO 60% ,  Methacholine challenge 03/16/09 normal ,  CT chest 03/12/09 no pulmonary disease   Fungal infection    GERD (gastroesophageal reflux disease)    History of colonoscopy 10/17/2002   by Dr Laural Golden distal non-specific proctitis, small ext hemorrhoids,     HTN (hypertension)    Hyperlipidemia    Hyperthyroidism    radioactive - thyroid cancer    Migraine headache    Morbid obesity with body mass index of 50.0-59.9 in adult [facility]) JAN 2011 370 LBS   2004 311 BMI 45.9   Myocardial infarction ([place])    NOV 1997   OSA on CPAP    she had been on 2L O2 at night but that was stopped   PONV (postoperative nausea and vomiting)    PTSD (post-traumatic stress disorder)    PTSD (post-traumatic stress disorder)    Suicidal ideation    Urine incontinence    Vitamin D deficiency     Past Surgical History:  Procedure Laterality Date   ABDOMINAL HYSTERECTOMY     sept 1996   APPENDECTOMY     BACK SURGERY  2008   CARDIAC CATHETERIZATION     nov 1997   CHOLECYSTECTOMY     COLONOSCOPY  10/17/2002    Distal proctitis, small external hemorrhoids, otherwise/  normal colonoscopy. Suspect rectal bleeding secondary to hemorrhoids   ESOPHAGOGASTRODUODENOSCOPY  03/18/09   fundic gland polyps/mild gastritis   HERNIA REPAIR  1978   JOINT REPLACEMENT     bil knee replacement   KNEE ARTHROSCOPY  LEFT HEART CATH AND CORONARY ANGIOGRAPHY N/A 08/08/2018   Procedure: LEFT HEART CATH AND CORONARY ANGIOGRAPHY;  Surgeon: Jettie Booze, MD;  Location: [facility];  Service: Cardiovascular;  Laterality: N/A;   MULTIPLE EXTRACTIONS WITH ALVEOLOPLASTY N/A 08/16/2015   Procedure: EXTRACTION OF TEETH THREE, SIX, EIGHT, NINE, ELEVEN, FOURTEEN, FIFTEEN, TWENTY-EIGHT WITH ALVEOLOPLASTY;  Surgeon: Diona Browner, DDS;  Location: [facility];  Service: Oral Surgery;  Laterality: N/A;   ROTATOR CUFF REPAIR Right    TONSILLECTOMY     TOTAL VAGINAL HYSTERECTOMY     TUBAL LIGATION       Social History:  The patient  reports that she has never smoked. She has never used smokeless tobacco. She reports that she does not drink alcohol and does not use drugs.   Family History:  The patient's family history includes AAA (abdominal aortic aneurysm) in her mother; Alcohol  abuse in her father and son; Arthritis in her sister; Bipolar disorder in her brother, mother, and sister; Cancer in her maternal aunt, maternal grandfather, sister, and son; Coronary artery disease in her brother and father; Dementia in her mother; Depression in her brother, mother, sister, and sister; Drug abuse in her son; Hypertension in her brother, mother, and sister; Irregular heart beat in her sister; Paranoid behavior in her sister. She was adopted.    ROS:  Please see the history of present illness. All other systems are reviewed and  Negative to the above problem except as noted.    PHYSICAL EXAM: VS:  LMP 02/18/1995   GEN: Morbidly obese 67 yo in no acute distress  HEENT: normal  Neck: no JVD.  No carotid bruits Cardiac: RRR; no murmurs  No LE  edema  Respiratory:  clear to auscultation bilaterally, GI: soft, nontender, nondistended, + BS  No hepatomegaly  MS: no deformity Moving all extremities   Skin: warm and dry, no rash Neuro:  Strength and sensation are intact Psych: euthymic mood, full affect   EKG:  EKG is ordered today.  SR 82 bpm  Nonspecific ST changes    L Heart cath   08/08/18  Mid LAD lesion is 10% stenosed. The left ventricular systolic function is normal. LV end diastolic pressure is normal. The left ventricular ejection fraction is 55-65% by visual estimate. There is no aortic valve stenosis.   No significant CAD.   Continue preventive therapy.  Lipid Panel    Component Value Date/Time   CHOL 155 07/14/2020 1007   TRIG 191 (H) 07/14/2020 1007   HDL 38 (L) 07/14/2020 1007   CHOLHDL 4.1 07/14/2020 1007   CHOLHDL 4.3 08/08/2018 0548   VLDL 23 08/08/2018 0548   LDLCALC 84 07/14/2020 1007      Wt Readings from Last 3 Encounters:  01/18/22 (!) 341 lb (154.7 kg)  07/14/21 (!) 343 lb (155.6 kg)  08/26/20 (!) 345 lb 12.8 oz (156.9 kg)      ASSESSMENT AND PLAN:  1   Chest pressure  Patient denies     I would follow   Min CAD at cath  2    Dizziness/ sycnope   Currently doing OK on current regimen   No dizziness   Will check labs  Encouraged her to stay hydrated  3  PAF   Keep on current regimen of metoprolol and eliquis     4  HTN   BP is a little high today   with hx of syncope I would recomm she follow  5  HL  Continue lipitor      Current medicines are reviewed at length with the patient today.  The patient does not have concerns regarding medicines.  Signed, Dorris Carnes, MD  06/04/2022 11:17 PM    [address], [place], [place]  [address] Phone: [phone]; Fax: [phone]

## 2022-06-05 ENCOUNTER — Ambulatory Visit: Payer: Medicare Other | Admitting: Internal Medicine

## 2022-06-07 ENCOUNTER — Ambulatory Visit (HOSPITAL_COMMUNITY): Payer: Medicare Other | Admitting: Clinical

## 2022-07-10 DIAGNOSIS — M961 Postlaminectomy syndrome, not elsewhere classified: Secondary | ICD-10-CM | POA: Diagnosis not present

## 2022-07-10 DIAGNOSIS — D6869 Other thrombophilia: Secondary | ICD-10-CM | POA: Diagnosis not present

## 2022-07-10 DIAGNOSIS — Z8673 Personal history of transient ischemic attack (TIA), and cerebral infarction without residual deficits: Secondary | ICD-10-CM | POA: Diagnosis not present

## 2022-07-13 NOTE — Progress Notes (Deleted)
Cardiology Office Note   Date:  07/13/2022   ID:  Allison Sharp, DOB 04-11-1956, MRN [id number]  PCP:  Horald Pollen, MD  Cardiologist:   Dorris Carnes, MD       History of Present Illness: Allison Sharp is a 67 y.o. female with a history of chest pressure, palpitations, PAF (CHADS2VASc of 3), HTN ,  orthostatic hypotension. HL Cath in 2020 showed no significant CAD   The pt was hospitalized  in Aug 2022 with orthostatic hypotension and syncope   [name] and passed out  Clonidine and lasix stopped.  Echo showed normal biventricular function   Sent home with event monitor  This showed atrial fibrillation as well as NSVT  After D/c she was seen in cardiology clinic at [facility] in Aug    Recomm a PET CT perfusion scan due to size.    Monitor showed atrial fibrillation.  Recomm Eliquis and Toprol XL    The pt denies dizziness.   Breathing is OK   Denies palpitations      Says she is feeling better   No CP    I last saw the pt in clinic in Feb 2023  No outpatient medications have been marked as taking for the 07/14/22 encounter (Appointment) with Fay Records, MD.     Allergies:   Abilify [aripiprazole], Naproxen, Ativan [lorazepam], Haldol [haloperidol], and Hydroxyzine   Past Medical History:  Diagnosis Date   Allergy    Anxiety    Arthritis    Asthma    Bipolar 1 disorder ([place])    CAD (coronary artery disease)    Cellulitis    CHF (congestive heart failure) (HCC)    diastolic dysfunction   Chronic back pain    Chronic headaches    Complication of anesthesia    States she typically gets sick s/p anesthesia   Contusion of sacrum    COPD (chronic obstructive pulmonary disease) ([place])    Dementia (HCC)    Depression    Dyspnea    PFT 03/05/09 FEV1 2.77(98%), FVC 3.25(86%), FEV1% 85, TLC 5.88(99%), DLCO 60% ,  Methacholine challenge 03/16/09 normal ,  CT chest 03/12/09 no pulmonary disease   Fungal infection    GERD (gastroesophageal reflux  disease)    History of colonoscopy 10/17/2002   by Dr Laural Golden distal non-specific proctitis, small ext hemorrhoids,    HTN (hypertension)    Hyperlipidemia    Hyperthyroidism    radioactive - thyroid cancer    Migraine headache    Morbid obesity with body mass index of 50.0-59.9 in adult [facility]) JAN 2011 370 LBS   2004 311 BMI 45.9   Myocardial infarction ([place])    NOV 1997   OSA on CPAP    she had been on 2L O2 at night but that was stopped   PONV (postoperative nausea and vomiting)    PTSD (post-traumatic stress disorder)    PTSD (post-traumatic stress disorder)    Suicidal ideation    Urine incontinence    Vitamin D deficiency     Past Surgical History:  Procedure Laterality Date   ABDOMINAL HYSTERECTOMY     sept 1996   APPENDECTOMY     BACK SURGERY  2008   CARDIAC CATHETERIZATION     nov 1997   CHOLECYSTECTOMY     COLONOSCOPY  10/17/2002    Distal proctitis, small external hemorrhoids, otherwise/  normal colonoscopy. Suspect rectal bleeding secondary to hemorrhoids  ESOPHAGOGASTRODUODENOSCOPY  03/18/09   fundic gland polyps/mild gastritis   HERNIA REPAIR  1978   JOINT REPLACEMENT     bil knee replacement   KNEE ARTHROSCOPY     LEFT HEART CATH AND CORONARY ANGIOGRAPHY N/A 08/08/2018   Procedure: LEFT HEART CATH AND CORONARY ANGIOGRAPHY;  Surgeon: Jettie Booze, MD;  Location: [place] [facility];  Service: Cardiovascular;  Laterality: N/A;   MULTIPLE EXTRACTIONS WITH ALVEOLOPLASTY N/A 08/16/2015   Procedure: EXTRACTION OF TEETH THREE, SIX, EIGHT, NINE, ELEVEN, FOURTEEN, FIFTEEN, TWENTY-EIGHT WITH ALVEOLOPLASTY;  Surgeon: Diona Browner, DDS;  Location: [place];  Service: Oral Surgery;  Laterality: N/A;   ROTATOR CUFF REPAIR Right    TONSILLECTOMY     TOTAL VAGINAL HYSTERECTOMY     TUBAL LIGATION       Social History:  The patient  reports that she has never smoked. She has never used smokeless tobacco. She reports that she does not drink alcohol and does not use  drugs.   Family History:  The patient's family history includes AAA (abdominal aortic aneurysm) in her mother; Alcohol abuse in her father and son; Arthritis in her sister; Bipolar disorder in her brother, mother, and sister; Cancer in her maternal aunt, maternal grandfather, sister, and son; Coronary artery disease in her brother and father; Dementia in her mother; Depression in her brother, mother, sister, and sister; Drug abuse in her son; Hypertension in her brother, mother, and sister; Irregular heart beat in her sister; Paranoid behavior in her sister. She was adopted.    ROS:  Please see the history of present illness. All other systems are reviewed and  Negative to the above problem except as noted.    PHYSICAL EXAM: VS:  LMP 02/18/1995   GEN: Morbidly obese 67 yo in no acute distress  HEENT: normal  Neck: no JVD.  No carotid bruits Cardiac: RRR; no murmurs  No LE  edema  Respiratory:  clear to auscultation bilaterally, GI: soft, nontender, nondistended, + BS  No hepatomegaly  MS: no deformity Moving all extremities   Skin: warm and dry, no rash Neuro:  Strength and sensation are intact Psych: euthymic mood, full affect   EKG:  EKG is ordered today.  SR 82 bpm  Nonspecific ST changes    L Heart cath   08/08/18  Mid LAD lesion is 10% stenosed. The left ventricular systolic function is normal. LV end diastolic pressure is normal. The left ventricular ejection fraction is 55-65% by visual estimate. There is no aortic valve stenosis.   No significant CAD.   Continue preventive therapy.  Lipid Panel    Component Value Date/Time   CHOL 155 07/14/2020 1007   TRIG 191 (H) 07/14/2020 1007   HDL 38 (L) 07/14/2020 1007   CHOLHDL 4.1 07/14/2020 1007   CHOLHDL 4.3 08/08/2018 0548   VLDL 23 08/08/2018 0548   LDLCALC 84 07/14/2020 1007      Wt Readings from Last 3 Encounters:  01/18/22 (!) 341 lb (154.7 kg)  07/14/21 (!) 343 lb (155.6 kg)  08/26/20 (!) 345 lb 12.8 oz (156.9  kg)      ASSESSMENT AND PLAN:  1   Chest pressure  Patient denies     I would follow   Min CAD at cath  2   Dizziness/ sycnope   Currently doing OK on current regimen   No dizziness   Will check labs  Encouraged her to stay hydrated  3  PAF   Keep on current regimen  of metoprolol and eliquis     4  HTN   BP is a little high today   with hx of syncope I would recomm she follow      5  HL  Continue lipitor      Current medicines are reviewed at length with the patient today.  The patient does not have concerns regarding medicines.  Signed, Dorris Carnes, MD  07/13/2022 9:45 PM    [facility] [place], [place], [place]  [address] Phone: [phone]; Fax: [phone]     Cardiology Office Note   Date:  07/13/2022   ID:  AMILLIA SORIA, DOB 29-Nov-1955, MRN [id number]  PCP:  Horald Pollen, MD  Cardiologist:   Dorris Carnes, MD       History of Present Illness: Allison Sharp is a 67 y.o. female with a history of chest pressure, palpitations, PAF (CHADS2VASc of 3), HTN ,  orthostatic hypotension. HL Cath in 2020 showed no significant CAD   The pt was hospitalized  in Aug 2022 with orthostatic hypotension and syncope   [name] and passed out  Clonidine and lasix stopped.  Echo showed normal biventricular function   Sent home with event monitor  This showed atrial fibrillation as well as NSVT  After D/c she was seen in cardiology clinic at [facility] in Aug    Recomm a PET CT perfusion scan due to size.    Monitor showed atrial fibrillation.  Recomm Eliquis and Toprol XL    The pt denies dizziness.   Breathing is OK   Denies palpitations      Says she is feeling better   No CP    I saw hte pt in Feb 2023 No outpatient medications have been marked as taking for the 07/14/22 encounter (Appointment) with Fay Records, MD.     Allergies:   Abilify [aripiprazole], Naproxen, Ativan [lorazepam], Haldol [haloperidol], and  Hydroxyzine   Past Medical History:  Diagnosis Date   Allergy    Anxiety    Arthritis    Asthma    Bipolar 1 disorder ([place])    CAD (coronary artery disease)    Cellulitis    CHF (congestive heart failure) (HCC)    diastolic dysfunction   Chronic back pain    Chronic headaches    Complication of anesthesia    States she typically gets sick s/p anesthesia   Contusion of sacrum    COPD (chronic obstructive pulmonary disease) ([place])    Dementia (HCC)    Depression    Dyspnea    PFT 03/05/09 FEV1 2.77(98%), FVC 3.25(86%), FEV1% 85, TLC 5.88(99%), DLCO 60% ,  Methacholine challenge 03/16/09 normal ,  CT chest 03/12/09 no pulmonary disease   Fungal infection    GERD (gastroesophageal reflux disease)    History of colonoscopy 10/17/2002   by Dr Laural Golden distal non-specific proctitis, small ext hemorrhoids,    HTN (hypertension)    Hyperlipidemia    Hyperthyroidism    radioactive - thyroid cancer    Migraine headache    Morbid obesity with body mass index of 50.0-59.9 in adult ([place]) JAN 2011 370 LBS   2004 311 BMI 45.9   Myocardial infarction ([place])    NOV 1997   OSA on CPAP    she had been on 2L O2 at night but that was stopped   PONV (postoperative nausea and vomiting)    PTSD (post-traumatic stress  disorder)    PTSD (post-traumatic stress disorder)    Suicidal ideation    Urine incontinence    Vitamin D deficiency     Past Surgical History:  Procedure Laterality Date   ABDOMINAL HYSTERECTOMY     sept 1996   APPENDECTOMY     BACK SURGERY  2008   CARDIAC CATHETERIZATION     nov 1997   CHOLECYSTECTOMY     COLONOSCOPY  10/17/2002    Distal proctitis, small external hemorrhoids, otherwise/  normal colonoscopy. Suspect rectal bleeding secondary to hemorrhoids   ESOPHAGOGASTRODUODENOSCOPY  03/18/09   fundic gland polyps/mild gastritis   HERNIA REPAIR  1978   JOINT REPLACEMENT     bil knee replacement   KNEE ARTHROSCOPY     LEFT HEART CATH AND CORONARY ANGIOGRAPHY N/A  08/08/2018   Procedure: LEFT HEART CATH AND CORONARY ANGIOGRAPHY;  Surgeon: Jettie Booze, MD;  Location: [place] [facility];  Service: Cardiovascular;  Laterality: N/A;   MULTIPLE EXTRACTIONS WITH ALVEOLOPLASTY N/A 08/16/2015   Procedure: EXTRACTION OF TEETH THREE, SIX, EIGHT, NINE, ELEVEN, FOURTEEN, FIFTEEN, TWENTY-EIGHT WITH ALVEOLOPLASTY;  Surgeon: Diona Browner, DDS;  Location: [place];  Service: Oral Surgery;  Laterality: N/A;   ROTATOR CUFF REPAIR Right    TONSILLECTOMY     TOTAL VAGINAL HYSTERECTOMY     TUBAL LIGATION       Social History:  The patient  reports that she has never smoked. She has never used smokeless tobacco. She reports that she does not drink alcohol and does not use drugs.   Family History:  The patient's family history includes AAA (abdominal aortic aneurysm) in her mother; Alcohol abuse in her father and son; Arthritis in her sister; Bipolar disorder in her brother, mother, and sister; Cancer in her maternal aunt, maternal grandfather, sister, and son; Coronary artery disease in her brother and father; Dementia in her mother; Depression in her brother, mother, sister, and sister; Drug abuse in her son; Hypertension in her brother, mother, and sister; Irregular heart beat in her sister; Paranoid behavior in her sister. She was adopted.    ROS:  Please see the history of present illness. All other systems are reviewed and  Negative to the above problem except as noted.    PHYSICAL EXAM: VS:  LMP 02/18/1995   GEN: Morbidly obese 67 yo in no acute distress  HEENT: normal  Neck: no JVD.  No carotid bruits Cardiac: RRR; no murmurs  No LE  edema  Respiratory:  clear to auscultation bilaterally, GI: soft, nontender, nondistended, + BS  No hepatomegaly  MS: no deformity Moving all extremities   Skin: warm and dry, no rash Neuro:  Strength and sensation are intact Psych: euthymic mood, full affect   EKG:  EKG is ordered today.  SR 82 bpm  Nonspecific ST changes     L Heart cath   08/08/18  Mid LAD lesion is 10% stenosed. The left ventricular systolic function is normal. LV end diastolic pressure is normal. The left ventricular ejection fraction is 55-65% by visual estimate. There is no aortic valve stenosis.   No significant CAD.   Continue preventive therapy.  Lipid Panel    Component Value Date/Time   CHOL 155 07/14/2020 1007   TRIG 191 (H) 07/14/2020 1007   HDL 38 (L) 07/14/2020 1007   CHOLHDL 4.1 07/14/2020 1007   CHOLHDL 4.3 08/08/2018 0548   VLDL 23 08/08/2018 0548   LDLCALC 84 07/14/2020 1007      Wt Readings  from Last 3 Encounters:  01/18/22 (!) 341 lb (154.7 kg)  07/14/21 (!) 343 lb (155.6 kg)  08/26/20 (!) 345 lb 12.8 oz (156.9 kg)      ASSESSMENT AND PLAN:  1   Chest pressure  Patient denies     I would follow   Min CAD at cath  2   Dizziness/ sycnope   Currently doing OK on current regimen   No dizziness   Will check labs  Encouraged her to stay hydrated  3  PAF   Keep on current regimen of metoprolol and eliquis     4  HTN   BP is a little high today   with hx of syncope I would recomm she follow      5  HL  Continue lipitor      Current medicines are reviewed at length with the patient today.  The patient does not have concerns regarding medicines.  Signed, Dorris Carnes, MD  07/13/2022 9:44 PM    [facility] [place], [place], [place]  [address] Phone: [phone]; Fax: [phone]

## 2022-07-14 ENCOUNTER — Ambulatory Visit: Payer: Medicare Other | Admitting: Internal Medicine

## 2022-07-18 DIAGNOSIS — G4733 Obstructive sleep apnea (adult) (pediatric): Secondary | ICD-10-CM | POA: Diagnosis not present

## 2022-07-18 DIAGNOSIS — J449 Chronic obstructive pulmonary disease, unspecified: Secondary | ICD-10-CM | POA: Diagnosis not present

## 2022-07-27 ENCOUNTER — Encounter: Payer: Self-pay | Admitting: Radiology

## 2022-07-28 ENCOUNTER — Telehealth: Payer: Medicare Other | Admitting: Cardiology

## 2022-08-03 ENCOUNTER — Encounter (HOSPITAL_COMMUNITY): Payer: Self-pay | Admitting: Psychiatry

## 2022-08-03 ENCOUNTER — Telehealth (HOSPITAL_BASED_OUTPATIENT_CLINIC_OR_DEPARTMENT_OTHER): Payer: Medicare Other | Admitting: Psychiatry

## 2022-08-03 VITALS — Wt 332.0 lb

## 2022-08-03 DIAGNOSIS — F411 Generalized anxiety disorder: Secondary | ICD-10-CM | POA: Diagnosis not present

## 2022-08-03 DIAGNOSIS — F3160 Bipolar disorder, current episode mixed, unspecified: Secondary | ICD-10-CM | POA: Diagnosis not present

## 2022-08-03 DIAGNOSIS — F5105 Insomnia due to other mental disorder: Secondary | ICD-10-CM | POA: Diagnosis not present

## 2022-08-03 MED ORDER — DIVALPROEX SODIUM 500 MG PO DR TAB: 1000.0000 mg | DELAYED_RELEASE_TABLET | Freq: Every day | ORAL | 2 refills | Status: DC

## 2022-08-03 MED ORDER — DULOXETINE HCL 60 MG PO CPEP: 60.0000 mg | ORAL_CAPSULE | Freq: Every morning | ORAL | 2 refills | Status: DC

## 2022-08-03 MED ORDER — RISPERIDONE 1 MG PO TABS: 1.0000 mg | ORAL_TABLET | Freq: Every day | ORAL | 2 refills | Status: DC

## 2022-08-03 MED ORDER — RISPERIDONE 4 MG PO TABS: 4.0000 mg | ORAL_TABLET | Freq: Every day | ORAL | 2 refills | Status: DC

## 2022-08-03 NOTE — Progress Notes (Signed)
[facility] MD Virtual Progress Note   Patient Location: Home Provider Location: Office  I connect with patient by telephone and verified that I am speaking with correct person by using two identifiers. I discussed the limitations of evaluation and management by telemedicine and the availability of in person appointments. I also discussed with the patient that there may be a patient responsible charge related to this service. The patient expressed understanding and agreed to proceed.  Allison Sharp [id number] 66 y.o.  08/03/2022 2:47 PM  History of Present Illness:  Patient is evaluated by phone session.  She is no longer taking trazodone after did not see any improvement in her sleep.  Despite stopping the trazodone her sleep remains 4 to 5 hours but admitted not using CPAP on a regular basis because she Suffocated.  She has sinus infection and hoping once he resolves she can go back to used the CPAP all night.  She reported during the daytime started to have hallucinations and she started talking to herself.  She reported these voices are chattering and not comfortable.  She reported her mania is a stable denies any impulsive behavior but like to get rid of these voices during the daytime.  She reported holidays were quiet and good.  She still lives with her Sister Chong Sicilian.  She is trying to lose weight and lost few pounds since the last visit.  She started a new primary care Dr. Rolinda Roan and she is happy with the new PCP.  She denies any irritability, anger, suicidal thoughts.  She has no tremors or shakes.  Past Psychiatric History: H/O multiple inpatient. Last admissions in May 2020 at [facility]. H/O suicidal attempt by taking overdose.  Diagnosed with PTSD, depression, bipolar disorder.  Tried Paxil, Prozac, Wellbutrin, Effexor, Lexapro, amitriptyline, Cymbalta, Abilify (Tremors), Neurontin, trazodone, Thorazine, hydroxyzine, Luvox, Sinequan, Zyprexa, Ambien and  Latuda.  Best responded on Risperdal and Cymbalta.      Outpatient Encounter Medications as of 08/03/2022  Medication Sig   apixaban (ELIQUIS) 5 MG TABS tablet Take by mouth.   atorvastatin (LIPITOR) 20 MG tablet TAKE ONE TABLET AT BEDTIME   cetirizine (ZYRTEC) 10 MG tablet TAKE 1 TABLET ONCE DAILY   diclofenac sodium (VOLTAREN) 1 % GEL Apply to affected area twice daily as needed (Patient taking differently: Apply 2 g topically 2 (two) times daily as needed (PAIN).)   divalproex (DEPAKOTE) 500 MG DR tablet Take 2 tablets (1,000 mg total) by mouth at bedtime.   DULoxetine (CYMBALTA) 60 MG capsule Take 1 capsule (60 mg total) by mouth in the morning.   furosemide (LASIX) 20 MG tablet Take 20 mg by mouth as needed. Takes if gaines 3 pounds   levothyroxine (SYNTHROID) 125 MCG tablet TAKE 1 TABLET DAILY   metoprolol succinate (TOPROL-XL) 25 MG 24 hr tablet Take 1 tablet by mouth daily.   risperidone (RISPERDAL) 4 MG tablet Take 1 tablet (4 mg total) by mouth at bedtime.   traZODone (DESYREL) 100 MG tablet Take 1 tablet (100 mg total) by mouth at bedtime.   No facility-administered encounter medications on file as of 08/03/2022.    No results found for this or any previous visit (from the past 2160 hour(s)).   Psychiatric Specialty Exam: Physical Exam  Review of Systems  Weight (!) 332 lb (150.6 kg), last menstrual period 02/18/1995.Body mass index is 49.03 kg/m.  General Appearance: NA  Eye Contact:  NA  Speech:  Slow  Volume:  Decreased  Mood:  NA  Affect:  NA  Thought Process:  Descriptions of Associations: Intact  Orientation:  Full (Time, Place, and Person)  Thought Content:  Hallucinations: Auditory and Rumination  Suicidal Thoughts:  No  Homicidal Thoughts:  No  Memory:  Immediate;   Good Recent;   Fair Remote;   Fair  Judgement:  Intact  Insight:  Present  Psychomotor Activity:  NA  Concentration:  Concentration: Fair and Attention Span: Fair  Recall:  [employer] of  Knowledge:  Fair  Language:  Good  Akathisia:  No  Handed:  Right  AIMS (if indicated):     Assets:  Communication Skills Desire for Improvement Housing Social Support  ADL's:  Intact  Cognition:  WNL  Sleep:  uses CPAP     Assessment/Plan: Mixed bipolar I disorder ([place]) - Plan: risperidone (RISPERDAL) 4 MG tablet, DULoxetine (CYMBALTA) 60 MG capsule, divalproex (DEPAKOTE) 500 MG DR tablet, risperiDONE (RISPERDAL) 1 MG tablet  Generalized anxiety disorder - Plan: DULoxetine (CYMBALTA) 60 MG capsule  Insomnia due to mental disorder  Discussed current medication.  Patient no longer taking trazodone.  Her sleep remains unchanged but promised that she will use her CPAP more frequently and all night once her sinus infection get better.  She does not want to go back to trazodone but like something to help during the day to help these hallucinations.  She had a good response with risperidone.  I recommend she can try 1 mg of risperidone in the morning and keep the 4 mg risperidone at bedtime.  Continue Depakote 1000 mg at bedtime and Cymbalta 60 mg daily.  Her last Depakote level was done in August which was 60.  We will do Depakote level on her next appointment.  Recommend to call us back if she has any question or any concern.  Follow-up in 3 months.   Follow Up Instructions:     I discussed the assessment and treatment plan with the patient. The patient was provided an opportunity to ask questions and all were answered. The patient agreed with the plan and demonstrated an understanding of the instructions.   The patient was advised to call back or seek an in-person evaluation if the symptoms worsen or if the condition fails to improve as anticipated.    Collaboration of Care: Other provider involved in patient's care AEB notes are available in epic to review  Patient/Guardian was advised Release of Information must be obtained prior to any record release in order to collaborate their  care with an outside provider. Patient/Guardian was advised if they have not already done so to contact the registration department to sign all necessary forms in order for [name] to release information regarding their care.   Consent: Patient/Guardian gives verbal consent for treatment and assignment of benefits for services provided during this visit. Patient/Guardian expressed understanding and agreed to proceed.     I provided 28 minutes of non face to face time during this encounter.  Kathlee Nations, MD 08/03/2022

## 2022-08-11 ENCOUNTER — Encounter: Payer: Self-pay | Admitting: Physician Assistant

## 2022-08-16 ENCOUNTER — Ambulatory Visit: Payer: Medicare Other | Admitting: Gastroenterology

## 2022-08-25 ENCOUNTER — Ambulatory Visit: Payer: Medicare Other | Admitting: Cardiology

## 2022-08-30 ENCOUNTER — Ambulatory Visit: Payer: Medicare Other | Admitting: Cardiology

## 2022-09-25 ENCOUNTER — Ambulatory Visit: Payer: Medicare Other | Admitting: Physician Assistant

## 2022-10-04 ENCOUNTER — Ambulatory Visit: Payer: Medicare Other | Admitting: Cardiology

## 2022-10-11 ENCOUNTER — Other Ambulatory Visit (HOSPITAL_COMMUNITY): Payer: Self-pay | Admitting: Psychiatry

## 2022-10-11 DIAGNOSIS — F3160 Bipolar disorder, current episode mixed, unspecified: Secondary | ICD-10-CM

## 2022-10-16 ENCOUNTER — Other Ambulatory Visit (HOSPITAL_COMMUNITY): Payer: Self-pay | Admitting: Psychiatry

## 2022-10-16 DIAGNOSIS — F3160 Bipolar disorder, current episode mixed, unspecified: Secondary | ICD-10-CM

## 2022-11-02 ENCOUNTER — Telehealth (HOSPITAL_BASED_OUTPATIENT_CLINIC_OR_DEPARTMENT_OTHER): Payer: Medicare Other | Admitting: Psychiatry

## 2022-11-02 ENCOUNTER — Encounter (HOSPITAL_COMMUNITY): Payer: Self-pay | Admitting: Psychiatry

## 2022-11-02 VITALS — Wt 332.0 lb

## 2022-11-02 DIAGNOSIS — F5105 Insomnia due to other mental disorder: Secondary | ICD-10-CM | POA: Diagnosis not present

## 2022-11-02 DIAGNOSIS — F411 Generalized anxiety disorder: Secondary | ICD-10-CM | POA: Diagnosis not present

## 2022-11-02 DIAGNOSIS — F3160 Bipolar disorder, current episode mixed, unspecified: Secondary | ICD-10-CM | POA: Diagnosis not present

## 2022-11-02 MED ORDER — DULOXETINE HCL 60 MG PO CPEP
60.0000 mg | ORAL_CAPSULE | Freq: Every morning | ORAL | 2 refills | Status: DC
Start: 2022-11-02 — End: 2023-01-25

## 2022-11-02 MED ORDER — RISPERIDONE 1 MG PO TABS
1.0000 mg | ORAL_TABLET | Freq: Every day | ORAL | 2 refills | Status: DC
Start: 2022-11-02 — End: 2023-01-25

## 2022-11-02 MED ORDER — DIVALPROEX SODIUM 500 MG PO DR TAB: 1000.0000 mg | DELAYED_RELEASE_TABLET | Freq: Every day | ORAL | 2 refills | Status: DC

## 2022-11-02 MED ORDER — RISPERIDONE 4 MG PO TABS
4.0000 mg | ORAL_TABLET | Freq: Every day | ORAL | 2 refills | Status: DC
Start: 2022-11-02 — End: 2023-01-25

## 2022-11-02 NOTE — Progress Notes (Signed)
[facility] MD Virtual Progress Note   Patient Location: Home Provider Location: Office  I connect with patient by telephone and verified that I am speaking with correct person by using two identifiers. I discussed the limitations of evaluation and management by telemedicine and the availability of in person appointments. I also discussed with the patient that there may be a patient responsible charge related to this service. The patient expressed understanding and agreed to proceed.  Allison Sharp [id number] 67 y.o.  11/02/2022 2:58 PM  History of Present Illness:  Patient is evaluated by phone session.  Patient told she was seen in the emergency room at [facility] on May 1.  Patient thought that she had a stroke but I review the information and it appears she has a chest pain and headaches.  She was having caffeine withdrawal.  Patient reported her headaches are now much better but she has struggle with sleep and now she is taking Risperdal 1 mg at bedtime with 4 mg.  She is sleeping better.  She still have occasionally auditory hallucination  which she described hearing animal voices and sometimes visual hallucinations of seeing bugs.  Patient reported these are chronic and manageable but does not let her sleep sometimes.  She denies any irritability, anger but endorsed paranoia.  She does not leave the house unless it is important.  She is supposed to see the neurology but has not scheduled yet.  Patient is going to [place] in October 2 see her sister and to celebrate her birthday.  Patient is seeing a new PCP Dr. Fransisco Hertz.  She is happy with the current medication.  She denies any mania, impulsive behavior, aggression, violence or any suicidal thoughts.  Her appetite is okay.  Her weight is unchanged from the past.  Past Psychiatric History: H/O multiple inpatient. Last admissions in May 2020 at [facility]. H/O suicidal attempt by taking overdose.  Diagnosed with  PTSD, depression, bipolar disorder.  Tried Paxil, Prozac, Wellbutrin, Effexor, Lexapro, amitriptyline, Cymbalta, Abilify (Tremors), Neurontin, trazodone, Thorazine, hydroxyzine, Luvox, Sinequan, Zyprexa, Ambien and Latuda.  Best responded on Risperdal and Cymbalta.      Outpatient Encounter Medications as of 11/02/2022  Medication Sig   apixaban (ELIQUIS) 5 MG TABS tablet Take by mouth.   atorvastatin (LIPITOR) 20 MG tablet TAKE ONE TABLET AT BEDTIME   cetirizine (ZYRTEC) 10 MG tablet TAKE 1 TABLET ONCE DAILY   diclofenac sodium (VOLTAREN) 1 % GEL Apply to affected area twice daily as needed (Patient taking differently: Apply 2 g topically 2 (two) times daily as needed (PAIN).)   divalproex (DEPAKOTE) 500 MG DR tablet Take 2 tablets (1,000 mg total) by mouth at bedtime.   DULoxetine (CYMBALTA) 60 MG capsule Take 1 capsule (60 mg total) by mouth in the morning.   furosemide (LASIX) 20 MG tablet Take 20 mg by mouth as needed. Takes if gaines 3 pounds   levothyroxine (SYNTHROID) 125 MCG tablet TAKE 1 TABLET DAILY   metoprolol succinate (TOPROL-XL) 25 MG 24 hr tablet Take 1 tablet by mouth daily.   risperiDONE (RISPERDAL) 1 MG tablet Take 1 tablet (1 mg total) by mouth daily.   risperidone (RISPERDAL) 4 MG tablet Take 1 tablet (4 mg total) by mouth at bedtime.   traZODone (DESYREL) 100 MG tablet Take 1 tablet (100 mg total) by mouth at bedtime. (Patient not taking: Reported on 08/03/2022)   No facility-administered encounter medications on file as of 11/02/2022.    No results found for  this or any previous visit (from the past 2160 hour(s)).   Psychiatric Specialty Exam: Physical Exam  Review of Systems  Weight (!) 332 lb (150.6 kg), last menstrual period 02/18/1995.There is no height or weight on file to calculate BMI.  General Appearance: NA  Eye Contact:  NA  Speech:  Slow  Volume:  Decreased  Mood:  NA  Affect:  NA  Thought Process:  Descriptions of Associations: Intact  Orientation:   Full (Time, Place, and Person)  Thought Content:  Hallucinations: Auditory, Paranoid Ideation, and Rumination  Suicidal Thoughts:  No  Homicidal Thoughts:  No  Memory:  Immediate;   Fair Recent;   Fair Remote;   Fair  Judgement:  Fair  Insight:  Fair  Psychomotor Activity:  Decreased  Concentration:  Concentration: Fair and Attention Span: Fair  Recall:  [employer] of Knowledge:  Fair  Language:  Good  Akathisia:  No  Handed:  Right  AIMS (if indicated):     Assets:  Communication Skills Desire for Improvement Housing Social Support  ADL's:  Intact  Cognition:  WNL  Sleep:  use CPAP     Assessment/Plan: Mixed bipolar I disorder (HCC) - Plan: DULoxetine (CYMBALTA) 60 MG capsule, risperidone (RISPERDAL) 4 MG tablet, risperiDONE (RISPERDAL) 1 MG tablet, divalproex (DEPAKOTE) 500 MG DR tablet  Generalized anxiety disorder - Plan: DULoxetine (CYMBALTA) 60 MG capsule  Insomnia due to mental disorder  I reviewed blood work results and notes from [facility].  She was seen in the emergency room for chest pain and headaches but presumed that she had a stroke.  She did not have any weakness other than numbness which is chronic.  I encouraged her to see neurology and call them to schedule appointment.  I provided phone number of neurology at [facility] [phone].  We discussed medication and recommend to avoid taking all the Risperdal at bedtime due to excessive sedation in the morning.  We have recommended to cut down the Risperdal 1 mg in the morning but patient insists that she need to 5 mg altogether to help the symptoms.  Continue Depakote 1000 mg at bedtime and I encouraged to contact her PCP to get the Depakote level otherwise be have to put the orders.  Patient agree that she will see her PCP in few days and get the Depakote level and send [name] the results.  Continue Cymbalta 60 mg daily.  Recommend to call us back if she has any question or any concern.  Follow-up in 3  months.   Follow Up Instructions:     I discussed the assessment and treatment plan with the patient. The patient was provided an opportunity to ask questions and all were answered. The patient agreed with the plan and demonstrated an understanding of the instructions.   The patient was advised to call back or seek an in-person evaluation if the symptoms worsen or if the condition fails to improve as anticipated.    Collaboration of Care: Other provider involved in patient's care AEB notes are available in epic to review.  Patient/Guardian was advised Release of Information must be obtained prior to any record release in order to collaborate their care with an outside provider. Patient/Guardian was advised if they have not already done so to contact the registration department to sign all necessary forms in order for [name] to release information regarding their care.   Consent: Patient/Guardian gives verbal consent for treatment and assignment of benefits for services provided during this  visit. Patient/Guardian expressed understanding and agreed to proceed.     I provided 26 minutes of non face to face time during this encounter.  Note: This document was prepared by [employer] voice dictation technology and any errors that results from this process are unintentional.    Cleotis Nipper, MD 11/02/2022

## 2022-11-09 ENCOUNTER — Encounter (HOSPITAL_BASED_OUTPATIENT_CLINIC_OR_DEPARTMENT_OTHER): Payer: Self-pay

## 2022-11-09 ENCOUNTER — Ambulatory Visit (HOSPITAL_BASED_OUTPATIENT_CLINIC_OR_DEPARTMENT_OTHER): Payer: Medicare Other | Admitting: Family

## 2022-12-12 ENCOUNTER — Other Ambulatory Visit (HOSPITAL_COMMUNITY): Payer: Self-pay | Admitting: Psychiatry

## 2022-12-12 DIAGNOSIS — F5105 Insomnia due to other mental disorder: Secondary | ICD-10-CM

## 2022-12-12 DIAGNOSIS — F411 Generalized anxiety disorder: Secondary | ICD-10-CM

## 2022-12-12 DIAGNOSIS — F3160 Bipolar disorder, current episode mixed, unspecified: Secondary | ICD-10-CM

## 2022-12-26 ENCOUNTER — Ambulatory Visit: Payer: Medicare Other | Admitting: Cardiology

## 2023-01-12 ENCOUNTER — Other Ambulatory Visit (HOSPITAL_COMMUNITY): Payer: Self-pay | Admitting: Psychiatry

## 2023-01-12 DIAGNOSIS — F3160 Bipolar disorder, current episode mixed, unspecified: Secondary | ICD-10-CM

## 2023-01-25 ENCOUNTER — Encounter (HOSPITAL_COMMUNITY): Payer: Self-pay | Admitting: Psychiatry

## 2023-01-25 ENCOUNTER — Telehealth (HOSPITAL_BASED_OUTPATIENT_CLINIC_OR_DEPARTMENT_OTHER): Payer: Medicare Other | Admitting: Psychiatry

## 2023-01-25 VITALS — Wt 332.0 lb

## 2023-01-25 DIAGNOSIS — F3160 Bipolar disorder, current episode mixed, unspecified: Secondary | ICD-10-CM

## 2023-01-25 DIAGNOSIS — F5105 Insomnia due to other mental disorder: Secondary | ICD-10-CM

## 2023-01-25 DIAGNOSIS — F411 Generalized anxiety disorder: Secondary | ICD-10-CM | POA: Diagnosis not present

## 2023-01-25 MED ORDER — RISPERIDONE 4 MG PO TABS
4.0000 mg | ORAL_TABLET | Freq: Every day | ORAL | 2 refills | Status: DC
Start: 2023-01-25 — End: 2023-04-27

## 2023-01-25 MED ORDER — DULOXETINE HCL 60 MG PO CPEP
60.0000 mg | ORAL_CAPSULE | Freq: Every morning | ORAL | 2 refills | Status: DC
Start: 2023-01-25 — End: 2023-04-27

## 2023-01-25 MED ORDER — DIVALPROEX SODIUM 500 MG PO DR TAB: 1000.0000 mg | DELAYED_RELEASE_TABLET | Freq: Every day | ORAL | 2 refills | Status: DC

## 2023-01-25 MED ORDER — RISPERIDONE 1 MG PO TABS
1.0000 mg | ORAL_TABLET | Freq: Every day | ORAL | 2 refills | Status: DC
Start: 2023-01-25 — End: 2023-04-27

## 2023-01-25 NOTE — Progress Notes (Signed)
[facility] MD Virtual Progress Note   Patient Location: Home Provider Location: Home Office  I connect with patient by telephone and verified that I am speaking with correct person by using two identifiers. I discussed the limitations of evaluation and management by telemedicine and the availability of in person appointments. I also discussed with the patient that there may be a patient responsible charge related to this service. The patient expressed understanding and agreed to proceed.  Allison Sharp [id number] 67 y.o.  01/25/2023 3:00 PM  History of Present Illness:  Patient is evaluated by phone session.  She is doing very well and she feels her symptoms are manageable.  She is disappointed because cannot go to [place] to visit her sister because her sister she was going with her needed surgery.  Patient reported that her sister needs some help and she cannot leave her.  They live together.  She reported sleep is good.  She denies any paranoia but is still have some time chronic hallucination where she described hearing animal voices and seeing bugs.  But these hallucinations are very chronic and for a long time and she know how to manage that.  She denies any irritability, anger, mood swings.  She sleeps good with the help of Depakote.  She had not had a Depakote level since last year.  She has not seen her primary care doctor Dr. Fransisco Hertz.  She has no tremor or shakes or any EPS.  Her appetite is okay.  She denies any impulsive behavior, aggression, violence.  Her last Depakote level was drawn in August 2023 which was 60.7.  She is compliant with risperidone, Depakote and Cymbalta.  She sleeps good.  Past Psychiatric History: H/O multiple inpatient. Last admissions in May 2020 at [facility]. H/O suicidal attempt by taking overdose.  Diagnosed with PTSD, depression, bipolar disorder.  Tried Paxil, Prozac, Wellbutrin, Effexor, Lexapro, amitriptyline, Cymbalta, Abilify  (Tremors), Neurontin, trazodone, Thorazine, hydroxyzine, Luvox, Sinequan, Zyprexa, Ambien and Latuda.  Best responded on Risperdal and Cymbalta.      Outpatient Encounter Medications as of 01/25/2023  Medication Sig   apixaban (ELIQUIS) 5 MG TABS tablet Take by mouth.   atorvastatin (LIPITOR) 20 MG tablet TAKE ONE TABLET AT BEDTIME   cetirizine (ZYRTEC) 10 MG tablet TAKE 1 TABLET ONCE DAILY   diclofenac sodium (VOLTAREN) 1 % GEL Apply to affected area twice daily as needed (Patient taking differently: Apply 2 g topically 2 (two) times daily as needed (PAIN).)   divalproex (DEPAKOTE) 500 MG DR tablet Take 2 tablets (1,000 mg total) by mouth at bedtime.   DULoxetine (CYMBALTA) 60 MG capsule Take 1 capsule (60 mg total) by mouth in the morning.   furosemide (LASIX) 20 MG tablet Take 20 mg by mouth as needed. Takes if gaines 3 pounds   levothyroxine (SYNTHROID) 125 MCG tablet TAKE 1 TABLET DAILY   metoprolol succinate (TOPROL-XL) 25 MG 24 hr tablet Take 1 tablet by mouth daily.   risperiDONE (RISPERDAL) 1 MG tablet Take 1 tablet (1 mg total) by mouth daily.   risperidone (RISPERDAL) 4 MG tablet Take 1 tablet (4 mg total) by mouth at bedtime.   traZODone (DESYREL) 100 MG tablet Take 1 tablet (100 mg total) by mouth at bedtime. (Patient not taking: Reported on 08/03/2022)   No facility-administered encounter medications on file as of 01/25/2023.    No results found for this or any previous visit (from the past 2160 hour(s)).   Psychiatric Specialty Exam: Physical Exam  Review of Systems  Weight (!) 332 lb (150.6 kg), last menstrual period 02/18/1995.There is no height or weight on file to calculate BMI.  General Appearance: NA  Eye Contact:  NA  Speech:  Slow  Volume:  Normal  Mood:  Euthymic  Affect:  NA  Thought Process:  Descriptions of Associations: Intact  Orientation:  Full (Time, Place, and Person)  Thought Content:  Hallucinations: chronic hallucination and paranoia  Suicidal  Thoughts:  No  Homicidal Thoughts:  No  Memory:  Immediate;   Fair Recent;   Fair Remote;   Fair  Judgement:  Intact  Insight:  Present  Psychomotor Activity:  NA  Concentration:  Concentration: Fair and Attention Span: Fair  Recall:  Good  Fund of Knowledge:  Good  Language:  Good  Akathisia:  No  Handed:  Right  AIMS (if indicated):     Assets:  Communication Skills Desire for Improvement Housing Social Support  ADL's:  Intact  Cognition:  WNL  Sleep:  use CPAP     Assessment/Plan: Mixed bipolar I disorder (HCC) - Plan: divalproex (DEPAKOTE) 500 MG DR tablet, DULoxetine (CYMBALTA) 60 MG capsule, risperiDONE (RISPERDAL) 1 MG tablet, risperidone (RISPERDAL) 4 MG tablet  Generalized anxiety disorder - Plan: DULoxetine (CYMBALTA) 60 MG capsule  Insomnia due to mental disorder - Plan: divalproex (DEPAKOTE) 500 MG DR tablet  Patient is stable on current medication.  She does not want to change the current dose since it is working well.  I emphasized that she need to cut the Depakote level and if she had difficulty getting Depakote level and we need to order it.  Patient agree and acknowledge and promised to have it done before the next appointment.  Continue Depakote 1000 mg at bedtime, risperidone 1 mg in the morning and 4 mg at bedtime and Cymbalta 60 mg daily.  I also recommend to have next visit in person and she agreed with the plan.  Follow-up in 3 months   Follow Up Instructions:     I discussed the assessment and treatment plan with the patient. The patient was provided an opportunity to ask questions and all were answered. The patient agreed with the plan and demonstrated an understanding of the instructions.   The patient was advised to call back or seek an in-person evaluation if the symptoms worsen or if the condition fails to improve as anticipated.    Collaboration of Care: Other provider involved in patient's care AEB notes are available in epic to  review.  Patient/Guardian was advised Release of Information must be obtained prior to any record release in order to collaborate their care with an outside provider. Patient/Guardian was advised if they have not already done so to contact the registration department to sign all necessary forms in order for [name] to release information regarding their care.   Consent: Patient/Guardian gives verbal consent for treatment and assignment of benefits for services provided during this visit. Patient/Guardian expressed understanding and agreed to proceed.     I provided 21 minutes of non face to face time during this encounter.  Note: This document was prepared by [employer] voice dictation technology and any errors that results from this process are unintentional.    Cleotis Nipper, MD 01/25/2023

## 2023-01-31 ENCOUNTER — Telehealth (HOSPITAL_COMMUNITY): Payer: Medicare Other | Admitting: Psychiatry

## 2023-02-05 NOTE — Progress Notes (Deleted)
Cardiology Office Note   Date:  02/05/2023   ID:  Allison Sharp, DOB [birth year], MRN [id number]  PCP:  Allison Quint, MD  Cardiologist:   Dietrich Pates, MD       History of Present Illness: Allison Sharp is a 67 y.o. female with a history of chest pressure, palpitations, PAF (CHADS2VASc of 3), HTN ,  orthostatic hypotension. HL Cath in 2020 showed no significant CAD   The pt was hospitalized  in Aug 2022 with orthostatic hypotension and syncope   [name] and passed out  Clonidine and lasix stopped.  Echo showed normal biventricular function   Sent home with event monitor  This showed atrial fibrillation as well as NSVT  After D/c she was seen in cardiology clinic at [facility] in Aug    Recomm a PET CT perfusion scan due to size.    Monitor showed atrial fibrillation.  Recomm Eliquis and Toprol XL    The pt denies dizziness.   Breathing is OK   Denies palpitations      Says she is feeling better   No CP    [name]   No outpatient medications have been marked as taking for the 02/07/23 encounter (Appointment) with Pricilla Riffle, MD.     Allergies:   Abilify [aripiprazole], Naproxen, Ativan [lorazepam], Haldol [haloperidol], and Hydroxyzine   Past Medical History:  Diagnosis Date   Allergy    Anxiety    Arthritis    Asthma    Bipolar 1 disorder (HCC)    CAD (coronary artery disease)    Cellulitis    CHF (congestive heart failure) (HCC)    diastolic dysfunction   Chronic back pain    Chronic headaches    Complication of anesthesia    States she typically gets sick s/p anesthesia   Contusion of sacrum    COPD (chronic obstructive pulmonary disease) (HCC)    Dementia (HCC)    Depression    Dyspnea    PFT 03/05/09 FEV1 2.77(98%), FVC 3.25(86%), FEV1% 85, TLC 5.88(99%), DLCO 60% ,  Methacholine challenge 03/16/09 normal ,  CT chest 03/12/09 no pulmonary disease   Fungal infection    GERD (gastroesophageal reflux disease)     History of colonoscopy 10/17/2002   by Dr Karilyn Cota distal non-specific proctitis, small ext hemorrhoids,    HTN (hypertension)    Hyperlipidemia    Hyperthyroidism    radioactive - thyroid cancer    Migraine headache    Morbid obesity with body mass index of 50.0-59.9 in adult [facility]) JAN 2011 370 LBS   2004 311 BMI 45.9   Myocardial infarction (HCC)    NOV 1997   OSA on CPAP    she had been on 2L O2 at night but that was stopped   PONV (postoperative nausea and vomiting)    PTSD (post-traumatic stress disorder)    PTSD (post-traumatic stress disorder)    Suicidal ideation    Urine incontinence    Vitamin D deficiency     Past Surgical History:  Procedure Laterality Date   ABDOMINAL HYSTERECTOMY     sept 1996   APPENDECTOMY     BACK SURGERY  2008   CARDIAC CATHETERIZATION     nov 1997   CHOLECYSTECTOMY     COLONOSCOPY  10/17/2002    Distal proctitis, small external hemorrhoids, otherwise/  normal colonoscopy. Suspect rectal bleeding secondary to hemorrhoids   ESOPHAGOGASTRODUODENOSCOPY  03/18/09   fundic gland polyps/mild gastritis   HERNIA REPAIR  1978   JOINT REPLACEMENT     bil knee replacement   KNEE ARTHROSCOPY     LEFT HEART CATH AND CORONARY ANGIOGRAPHY N/A 08/08/2018   Procedure: LEFT HEART CATH AND CORONARY ANGIOGRAPHY;  Surgeon: Corky Crafts, MD;  Location: [facility] INVASIVE CV LAB;  Service: Cardiovascular;  Laterality: N/A;   MULTIPLE EXTRACTIONS WITH ALVEOLOPLASTY N/A 08/16/2015   Procedure: EXTRACTION OF TEETH THREE, SIX, EIGHT, NINE, ELEVEN, FOURTEEN, FIFTEEN, TWENTY-EIGHT WITH ALVEOLOPLASTY;  Surgeon: Ocie Doyne, DDS;  Location: [facility] OR;  Service: Oral Surgery;  Laterality: N/A;   ROTATOR CUFF REPAIR Right    TONSILLECTOMY     TOTAL VAGINAL HYSTERECTOMY     TUBAL LIGATION       Social History:  The patient  reports that she has never smoked. She has never used smokeless tobacco. She reports that she does not drink alcohol and does not use drugs.    Family History:  The patient's family history includes AAA (abdominal aortic aneurysm) in her mother; Alcohol abuse in her father and son; Arthritis in her sister; Bipolar disorder in her brother, mother, and sister; Cancer in her maternal aunt, maternal grandfather, sister, and son; Coronary artery disease in her brother and father; Dementia in her mother; Depression in her brother, mother, sister, and sister; Drug abuse in her son; Hypertension in her brother, mother, and sister; Irregular heart beat in her sister; Paranoid behavior in her sister. She was adopted.    ROS:  Please see the history of present illness. All other systems are reviewed and  Negative to the above problem except as noted.    PHYSICAL EXAM: VS:  LMP 02/18/1995   GEN: Morbidly obese 67 yo in no acute distress  HEENT: normal  Neck: no JVD.  No carotid bruits Cardiac: RRR; no murmurs  No LE  edema  Respiratory:  clear to auscultation bilaterally, GI: soft, nontender, nondistended, + BS  No hepatomegaly  MS: no deformity Moving all extremities   Skin: warm and dry, no rash Neuro:  Strength and sensation are intact Psych: euthymic mood, full affect   EKG:  EKG is ordered today.  SR 82 bpm  Nonspecific ST changes    L Heart cath   08/08/18  Mid LAD lesion is 10% stenosed. The left ventricular systolic function is normal. LV end diastolic pressure is normal. The left ventricular ejection fraction is 55-65% by visual estimate. There is no aortic valve stenosis.   No significant CAD.   Continue preventive therapy.  Lipid Panel    Component Value Date/Time   CHOL 155 07/14/2020 1007   TRIG 191 (H) 07/14/2020 1007   HDL 38 (L) 07/14/2020 1007   CHOLHDL 4.1 07/14/2020 1007   CHOLHDL 4.3 08/08/2018 0548   VLDL 23 08/08/2018 0548   LDLCALC 84 07/14/2020 1007      Wt Readings from Last 3 Encounters:  01/18/22 (!) 341 lb (154.7 kg)  07/14/21 (!) 343 lb (155.6 kg)  08/26/20 (!) 345 lb 12.8 oz (156.9 kg)       ASSESSMENT AND PLAN:  1   Chest pressure  Patient denies     I would follow   Min CAD at cath  2   Dizziness/ sycnope   Currently doing OK on current regimen   No dizziness   Will check labs  Encouraged her to stay hydrated  3  PAF   Keep on current regimen of metoprolol  and eliquis     4  HTN   BP is a little high today   with hx of syncope I would recomm she follow      5  HL  Continue lipitor      Current medicines are reviewed at length with the patient today.  The patient does not have concerns regarding medicines.  Signed, Dietrich Pates, MD  02/05/2023 9:09 PM    [facility] [address] [place], [place], [place]  [address] Phone: [phone]; Fax: [phone]

## 2023-02-07 ENCOUNTER — Ambulatory Visit: Payer: Medicare Other | Admitting: Internal Medicine

## 2023-03-19 ENCOUNTER — Ambulatory Visit: Payer: Medicare Other | Admitting: Nurse Practitioner

## 2023-03-19 NOTE — Progress Notes (Deleted)
New Patient Office Visit  Subjective    Patient ID: Allison Sharp, female    DOB: [birth year]  Age: 67 y.o. MRN: [id number]  CC: No chief complaint on file.   HPI Allison Sharp presents to establish care ***  Outpatient Encounter Medications as of 03/19/2023  Medication Sig   apixaban (ELIQUIS) 5 MG TABS tablet Take by mouth.   atorvastatin (LIPITOR) 20 MG tablet TAKE ONE TABLET AT BEDTIME   cetirizine (ZYRTEC) 10 MG tablet TAKE 1 TABLET ONCE DAILY   diclofenac sodium (VOLTAREN) 1 % GEL Apply to affected area twice daily as needed (Patient taking differently: Apply 2 g topically 2 (two) times daily as needed (PAIN).)   divalproex (DEPAKOTE) 500 MG DR tablet Take 2 tablets (1,000 mg total) by mouth at bedtime.   DULoxetine (CYMBALTA) 60 MG capsule Take 1 capsule (60 mg total) by mouth in the morning.   furosemide (LASIX) 20 MG tablet Take 20 mg by mouth as needed. Takes if gaines 3 pounds   levothyroxine (SYNTHROID) 125 MCG tablet TAKE 1 TABLET DAILY   metoprolol succinate (TOPROL-XL) 25 MG 24 hr tablet Take 1 tablet by mouth daily.   risperiDONE (RISPERDAL) 1 MG tablet Take 1 tablet (1 mg total) by mouth daily.   risperidone (RISPERDAL) 4 MG tablet Take 1 tablet (4 mg total) by mouth at bedtime.   traZODone (DESYREL) 100 MG tablet Take 1 tablet (100 mg total) by mouth at bedtime. (Patient not taking: Reported on 08/03/2022)   No facility-administered encounter medications on file as of 03/19/2023.    Past Medical History:  Diagnosis Date   Allergy    Anxiety    Arthritis    Asthma    Bipolar 1 disorder (HCC)    CAD (coronary artery disease)    Cellulitis    CHF (congestive heart failure) (HCC)    diastolic dysfunction   Chronic back pain    Chronic headaches    Complication of anesthesia    States she typically gets sick s/p anesthesia   Contusion of sacrum    COPD (chronic obstructive pulmonary disease) (HCC)    Dementia (HCC)    Depression    Dyspnea    PFT  03/05/09 FEV1 2.77(98%), FVC 3.25(86%), FEV1% 85, TLC 5.88(99%), DLCO 60% ,  Methacholine challenge 03/16/09 normal ,  CT chest 03/12/09 no pulmonary disease   Fungal infection    GERD (gastroesophageal reflux disease)    History of colonoscopy 10/17/2002   by Dr Karilyn Cota distal non-specific proctitis, small ext hemorrhoids,    HTN (hypertension)    Hyperlipidemia    Hyperthyroidism    radioactive - thyroid cancer    Migraine headache    Morbid obesity with body mass index of 50.0-59.9 in adult (HCC) JAN 2011 370 LBS   2004 311 BMI 45.9   Myocardial infarction (HCC)    NOV 1997   OSA on CPAP    she had been on 2L O2 at night but that was stopped   PONV (postoperative nausea and vomiting)    PTSD (post-traumatic stress disorder)    PTSD (post-traumatic stress disorder)    Suicidal ideation    Urine incontinence    Vitamin D deficiency     Past Surgical History:  Procedure Laterality Date   ABDOMINAL HYSTERECTOMY     sept 1996   APPENDECTOMY     BACK SURGERY  2008   CARDIAC CATHETERIZATION     nov 1997   CHOLECYSTECTOMY  COLONOSCOPY  10/17/2002    Distal proctitis, small external hemorrhoids, otherwise/  normal colonoscopy. Suspect rectal bleeding secondary to hemorrhoids   ESOPHAGOGASTRODUODENOSCOPY  03/18/09   fundic gland polyps/mild gastritis   HERNIA REPAIR  1978   JOINT REPLACEMENT     bil knee replacement   KNEE ARTHROSCOPY     LEFT HEART CATH AND CORONARY ANGIOGRAPHY N/A 08/08/2018   Procedure: LEFT HEART CATH AND CORONARY ANGIOGRAPHY;  Surgeon: Corky Crafts, MD;  Location[facility] INVASIVE CV LAB;  Service: Cardiovascular;  Laterality: N/A;   MULTIPLE EXTRACTIONS WITH ALVEOLOPLASTY N/A 08/16/2015   Procedure: EXTRACTION OF TEETH THREE, SIX, EIGHT, NINE, ELEVEN, FOURTEEN, FIFTEEN, TWENTY-EIGHT WITH ALVEOLOPLASTY;  Surgeon: Ocie Doyne, DDS;  Location: [facility] OR;  Service: Oral Surgery;  Laterality: N/A;   ROTATOR CUFF REPAIR Right    TONSILLECTOMY     TOTAL  VAGINAL HYSTERECTOMY     TUBAL LIGATION      Family History  Adopted: Yes  Problem Relation Age of Onset   Hypertension Mother    Bipolar disorder Mother    Dementia Mother    Depression Mother    AAA (abdominal aortic aneurysm) Mother    Coronary artery disease Father    Alcohol abuse Father    Hypertension Brother    Coronary artery disease Brother    Bipolar disorder Brother    Depression Brother    Depression Sister    Paranoid behavior Sister    Cancer Sister        breast   Bipolar disorder Sister    Depression Sister    Hypertension Sister    Arthritis Sister        knee and hand    Irregular heart beat Sister        takes eliquis    Cancer Son        thyroid   Drug abuse Son    Alcohol abuse Son    Cancer Maternal Grandfather        throat    Cancer Maternal Aunt        breast metastatized to brain   Anesthesia problems Neg Hx    Hypotension Neg Hx    Malignant hyperthermia Neg Hx    Pseudochol deficiency Neg Hx     Social History   Socioeconomic History   Marital status: Single    Spouse name: Not on file   Number of children: 2   Years of education: Not on file   Highest education level: Not on file  Occupational History   Occupation: Disabled    Employer: UNEMPLOYED    Comment: back problems  Tobacco Use   Smoking status: Never   Smokeless tobacco: Never  Vaping Use   Vaping status: Never Used  Substance and Sexual Activity   Alcohol use: No    Alcohol/week: 0.0 standard drinks of alcohol   Drug use: No   Sexual activity: Never    Birth control/protection: Abstinence  Other Topics Concern   Not on file  Social History Narrative   [name]  is disabled and lives with her sister.    She has two grown sons that she does not see often, as they live in other states. She has 5 grand children.   She has a long history of mental illness including depression, PTSD, suicidal and homicidal ideation.   She has been obese most all of her life.  Her weight has significantly impacted her QOL. She recently lost 20 lbs. By decreasing portion  size & increasing proteins.    Social Determinants of Health   Financial Resource Strain: Medium Risk (12/15/2020)   Received from [facility], [facility]   Overall Financial Resource Strain (CARDIA)    Difficulty of Paying Living Expenses: Somewhat hard  Food Insecurity: Food Insecurity Present (12/15/2020)   Received from [facility], [facility]   Hunger Vital Sign    Worried About Running Out of Food in the Last Year: Sometimes true    Ran Out of Food in the Last Year: Sometimes true  Transportation Needs: No Transportation Needs (12/15/2020)   Received from [facility], [facility]   [facility] - Transportation    Lack of Transportation (Medical): No    Lack of Transportation (Non-Medical): No  Physical Activity: Not on file  Stress: Not on file  Social Connections: Unknown (10/08/2021)   Received from [facility], [facility]   Social Network    Social Network: Not on file  Intimate Partner Violence: Unknown (08/31/2021)   Received from [facility], [facility]   HITS    Physically Hurt: Not on file    Insult or Talk Down To: Not on file    Threaten Physical Harm: Not on file    Scream or Curse: Not on file    ROS Negative unless indicated in HPI   Objective    LMP 02/18/1995   Physical Exam  Last CBC Lab Results  Component Value Date   WBC 8.1 07/14/2021   HGB 14.8 07/14/2021   HCT 44.5 07/14/2021   MCV 93 07/14/2021   MCH 31.0 07/14/2021   RDW 12.9 07/14/2021   PLT 193 07/14/2021   Last metabolic panel Lab Results  Component Value Date   GLUCOSE 99 07/14/2021   NA 144 07/14/2021   K 4.5 07/14/2021   CL 103 07/14/2021   CO2 23 07/14/2021   BUN 9 07/14/2021   CREATININE 0.98 07/14/2021   EGFR 63 07/14/2021   CALCIUM 10.1 07/14/2021   PHOS 3.3 09/27/2017   PROT 6.5 07/14/2020   ALBUMIN 4.2 07/14/2020   LABGLOB 2.3 07/14/2020    AGRATIO 1.8 07/14/2020   BILITOT 0.4 07/14/2020   ALKPHOS 82 07/14/2020   AST 16 07/14/2020   ALT 12 07/14/2020   ANIONGAP 11 10/14/2018   Last lipids Lab Results  Component Value Date   CHOL 155 07/14/2020   HDL 38 (L) 07/14/2020   LDLCALC 84 07/14/2020   TRIG 191 (H) 07/14/2020   CHOLHDL 4.1 07/14/2020   Last hemoglobin A1c Lab Results  Component Value Date   HGBA1C 5.3 08/08/2018   Last thyroid functions Lab Results  Component Value Date   TSH 3.420 07/14/2020   T3TOTAL 91.9 01/07/2012   T4TOTAL 8.5 09/25/2018        Assessment & Plan:  Routine medical exam  Continue healthy lifestyle choices, including diet (rich in fruits, vegetables, and lean proteins, and low in salt and simple carbohydrates) and exercise (at least 30 minutes of moderate physical activity daily).     The above assessment and management plan was discussed with the patient. The patient verbalized understanding of and has agreed to the management plan. Patient is aware to call the clinic if they develop any new symptoms or if symptoms persist or worsen. Patient is aware when to return to the clinic for a follow-up visit. Patient educated on when it is appropriate to go to the emergency department.   No follow-ups on file.  Arrie Aran Santa Lighter, DNP [facility] [address] [place], [place] [address] [phone]

## 2023-03-26 ENCOUNTER — Ambulatory Visit: Payer: Medicare Other | Attending: Cardiology | Admitting: Cardiology

## 2023-03-26 ENCOUNTER — Encounter: Payer: Self-pay | Admitting: Cardiology

## 2023-03-26 VITALS — BP 130/100 | HR 99 | Ht 69.0 in | Wt 342.0 lb

## 2023-03-26 DIAGNOSIS — G4733 Obstructive sleep apnea (adult) (pediatric): Secondary | ICD-10-CM

## 2023-03-26 DIAGNOSIS — I10 Essential (primary) hypertension: Secondary | ICD-10-CM

## 2023-03-26 NOTE — Patient Instructions (Signed)
Medication Instructions:  Your physician recommends that you continue on your current medications as directed. Please refer to the Current Medication list given to you today.  *If you need a refill on your cardiac medications before your next appointment, please call your pharmacy*   Lab Work: None.  If you have labs (blood work) drawn today and your tests are completely normal, you will receive your results only by: MyChart Message (if you have MyChart) OR A paper copy in the mail If you have any lab test that is abnormal or we need to change your treatment, we will call you to review the results.   Testing/Procedures: None.   Follow-Up: At [facility], you and your health needs are our priority.  As part of our continuing mission to provide you with exceptional heart care, we have created designated Provider Care Teams.  These Care Teams include your primary Cardiologist (physician) and Advanced Practice Providers (APPs -  Physician Assistants and Nurse Practitioners) who all work together to provide you with the care you need, when you need it.  We recommend signing up for the patient portal called "MyChart".  Sign up information is provided on this After Visit Summary.  MyChart is used to connect with patients for Virtual Visits (Telemedicine).  Patients are able to view lab/test results, encounter notes, upcoming appointments, etc.  Non-urgent messages can be sent to your provider as well.   To learn more about what you can do with MyChart, go to [email or web address].    Your next appointment:   1 year(s)  Provider:   Dr. Armanda Magic, MD   Other Instructions Dr. Mayford Knife has sent orders for new equipment to your DME company. Someone will call you to set up pick up/delivery.

## 2023-03-26 NOTE — Progress Notes (Signed)
Sleep Medicine CONSULT Note    Date:  03/26/2023   ID:  Allison Sharp, DOB Dec 24, 1955, MRN [id number]  PCP:  Allison Quint, MD  Cardiologist: Allison Pates, MD   Chief Complaint  Patient presents with   New Patient (Initial Visit)    Obstructive sleep apnea    History of Present Illness:  Allison Sharp is a 67 y.o. female who is being seen today for the evaluation of obstructive sleep apnea at the request of Allison Pates, MD.  This is a 67 year old female with a history of obstructive sleep apnea. She underwent PSG 2017 showing mild OSA with an AHI of 7.7/hr and oxygen desaturations as low as 83%. She underwent CPAP titration to 11cm H2O. when she was seen back in 2017 she felt like she was not getting any benefit from her CPAP even though her AHI was 1.6/h.    She then saw neurology complaining that she was feeling tired during the day and they repeated a sleep study 2019 showing mild to moderate obstructive sleep apnea worse during REM sleep.  A formal CPAP titration was recommended.   She underwent CPAP titration 08/09/2017 and CPAP at 10 cm H2O was recommended.  Patient never followed through with sleep consultation but has been using her CPAP for years.    Unfortunately her CPAP stopped working last week and she needs it to sleep. She uses it every night.  She was doing well with her PAP device up until it stopped working. She tolerates the full face mask.  She says that sometimes she feels like she cannot breathe at night and takes it off for a few minutes and then goes back on the mask.  She says that it feels like her mask is stuck to her face and she cannot breath. She used to have O2 bled into her CPAP but no longer and feels she may need O2.   She has a lot of problems with significant mouth or nasal dryness or nasal congestion but has never tried to adjust the humidity.   She does not think that she snores.    She is now referred for sleep medicine consultation to  establish sleep care.  Past Medical History:  Diagnosis Date   Allergy    Anxiety    Arthritis    Asthma    Bipolar 1 disorder (HCC)    CAD (coronary artery disease)    Cellulitis    CHF (congestive heart failure) (HCC)    diastolic dysfunction   Chronic back pain    Chronic headaches    Complication of anesthesia    States she typically gets sick s/p anesthesia   Contusion of sacrum    COPD (chronic obstructive pulmonary disease) (HCC)    Dementia (HCC)    Depression    Dyspnea    PFT 03/05/09 FEV1 2.77(98%), FVC 3.25(86%), FEV1% 85, TLC 5.88(99%), DLCO 60% ,  Methacholine challenge 03/16/09 normal ,  CT chest 03/12/09 no pulmonary disease   Fungal infection    GERD (gastroesophageal reflux disease)    History of colonoscopy 10/17/2002   by Dr Karilyn Cota distal non-specific proctitis, small ext hemorrhoids,    HTN (hypertension)    Hyperlipidemia    Hyperthyroidism    radioactive - thyroid cancer    Migraine headache    Morbid obesity with body mass index of 50.0-59.9 in adult [facility]) JAN 2011 370 LBS   2004 311 BMI 45.9   Myocardial infarction (HCC)  NOV 1997   OSA on CPAP    she had been on 2L O2 at night but that was stopped   PONV (postoperative nausea and vomiting)    PTSD (post-traumatic stress disorder)    PTSD (post-traumatic stress disorder)    Suicidal ideation    Urine incontinence    Vitamin D deficiency     Past Surgical History:  Procedure Laterality Date   ABDOMINAL HYSTERECTOMY     sept 1996   APPENDECTOMY     BACK SURGERY  2008   CARDIAC CATHETERIZATION     nov 1997   CHOLECYSTECTOMY     COLONOSCOPY  10/17/2002    Distal proctitis, small external hemorrhoids, otherwise/  normal colonoscopy. Suspect rectal bleeding secondary to hemorrhoids   ESOPHAGOGASTRODUODENOSCOPY  03/18/09   fundic gland polyps/mild gastritis   HERNIA REPAIR  1978   JOINT REPLACEMENT     bil knee replacement   KNEE ARTHROSCOPY     LEFT HEART CATH AND CORONARY  ANGIOGRAPHY N/A 08/08/2018   Procedure: LEFT HEART CATH AND CORONARY ANGIOGRAPHY;  Surgeon: Corky Crafts, MD;  Location: [facility] INVASIVE CV LAB;  Service: Cardiovascular;  Laterality: N/A;   MULTIPLE EXTRACTIONS WITH ALVEOLOPLASTY N/A 08/16/2015   Procedure: EXTRACTION OF TEETH THREE, SIX, EIGHT, NINE, ELEVEN, FOURTEEN, FIFTEEN, TWENTY-EIGHT WITH ALVEOLOPLASTY;  Surgeon: Ocie Doyne, DDS;  Location: [facility] OR;  Service: Oral Surgery;  Laterality: N/A;   ROTATOR CUFF REPAIR Right    TONSILLECTOMY     TOTAL VAGINAL HYSTERECTOMY     TUBAL LIGATION      Current Medications: Current Meds  Medication Sig   apixaban (ELIQUIS) 5 MG TABS tablet Take 5 mg by mouth 2 (two) times daily.   atorvastatin (LIPITOR) 20 MG tablet TAKE ONE TABLET AT BEDTIME   BREZTRI AEROSPHERE 160-9-4.8 MCG/ACT AERO daily. Per patient taking 1 puff daily   cetirizine (ZYRTEC) 10 MG tablet TAKE 1 TABLET ONCE DAILY   divalproex (DEPAKOTE) 500 MG DR tablet Take 2 tablets (1,000 mg total) by mouth at bedtime.   DULoxetine (CYMBALTA) 60 MG capsule Take 1 capsule (60 mg total) by mouth in the morning.   furosemide (LASIX) 20 MG tablet Take 20 mg by mouth as needed. Takes if gaines 3 pounds   levothyroxine (SYNTHROID) 125 MCG tablet TAKE 1 TABLET DAILY   metoprolol succinate (TOPROL-XL) 25 MG 24 hr tablet Take 1 tablet by mouth daily.   risperiDONE (RISPERDAL) 1 MG tablet Take 1 tablet (1 mg total) by mouth daily.   risperidone (RISPERDAL) 4 MG tablet Take 1 tablet (4 mg total) by mouth at bedtime.   traZODone (DESYREL) 100 MG tablet Take 1 tablet (100 mg total) by mouth at bedtime.   [DISCONTINUED] apixaban (ELIQUIS) 5 MG TABS tablet Take by mouth.    Allergies:   Abilify [aripiprazole], Naproxen, Ativan [lorazepam], Haldol [haloperidol], and Hydroxyzine   Social History   Socioeconomic History   Marital status: Single    Spouse name: Not on file   Number of children: 2   Years of education: Not on file   Highest  education level: Not on file  Occupational History   Occupation: Disabled    Employer: UNEMPLOYED    Comment: back problems  Tobacco Use   Smoking status: Never   Smokeless tobacco: Never  Vaping Use   Vaping status: Never Used  Substance and Sexual Activity   Alcohol use: No    Alcohol/week: 0.0 standard drinks of alcohol   Drug use: No  Sexual activity: Never    Birth control/protection: Abstinence  Other Topics Concern   Not on file  Social History Narrative   Ms.Prato  is disabled and lives with her sister.    She has two grown sons that she does not see often, as they live in other states. She has 5 grand children.   She has a long history of mental illness including depression, PTSD, suicidal and homicidal ideation.   She has been obese most all of her life. Her weight has significantly impacted her QOL. She recently lost 20 lbs. By decreasing portion size & increasing proteins.    Social Determinants of Health   Financial Resource Strain: Medium Risk (12/15/2020)   Received from [facility], [facility]   Overall Financial Resource Strain (CARDIA)    Difficulty of Paying Living Expenses: Somewhat hard  Food Insecurity: Food Insecurity Present (12/15/2020)   Received from [facility], [facility]   Hunger Vital Sign    Worried About Running Out of Food in the Last Year: Sometimes true    Ran Out of Food in the Last Year: Sometimes true  Transportation Needs: No Transportation Needs (12/15/2020)   Received from [facility], [facility]   [facility] - Transportation    Lack of Transportation (Medical): No    Lack of Transportation (Non-Medical): No  Physical Activity: Not on file  Stress: Not on file  Social Connections: Unknown (10/08/2021)   Received from [facility], [facility]   Social Network    Social Network: Not on file     Family History:  The patient's family history includes AAA (abdominal aortic aneurysm) in her mother; Alcohol  abuse in her father and son; Arthritis in her sister; Bipolar disorder in her brother, mother, and sister; Cancer in her maternal aunt, maternal grandfather, sister, and son; Coronary artery disease in her brother and father; Dementia in her mother; Depression in her brother, mother, sister, and sister; Drug abuse in her son; Hypertension in her brother, mother, and sister; Irregular heart beat in her sister; Paranoid behavior in her sister. She was adopted.   ROS:   Please see the history of present illness.    ROS All other systems reviewed and are negative.      No data to display         EKG Interpretation Date/Time:  Monday March 26 2023 15:10:33 EDT Ventricular Rate:  99 PR Interval:  144 QRS Duration:  102 QT Interval:  388 QTC Calculation: 497 R Axis:   7  Text Interpretation: Normal sinus rhythm Prolonged QT When compared with ECG of 15-Oct-2018 15:39, Questionable change in QRS axis Nonspecific T wave abnormality no longer evident in Anterior leads Confirmed by Armanda Magic ([id number]) on 03/26/2023 3:34:04 PM      PHYSICAL EXAM:   VS:  BP (!) 130/100   Pulse 99   Ht 5\' 9"  (1.753 m)   Wt (!) 342 lb (155.1 kg)   LMP 02/18/1995   SpO2 99%   BMI 50.50 kg/m    GEN: Well nourished, well developed, in no acute distress  HEENT: normal  Neck: no JVD, carotid bruits, or masses Cardiac: RRR; no murmurs, rubs, or gallops,no edema.  Intact distal pulses bilaterally.  Respiratory:  clear to auscultation bilaterally, normal work of breathing GI: soft, nontender, nondistended, + BS MS: no deformity or atrophy  Skin: warm and dry, no rash Neuro:  Alert and Oriented x 3, Strength and sensation  are intact Psych: euthymic mood, full affect  Wt Readings from Last 3 Encounters:  03/26/23 (!) 342 lb (155.1 kg)  01/18/22 (!) 341 lb (154.7 kg)  07/14/21 (!) 343 lb (155.6 kg)      Studies/Labs Reviewed:   Sleep study 2017/2019 and CPAP titration 2019  Recent Labs: No results  found for requested labs within last 365 days.      ASSESSMENT:    1. OSA on CPAP   2. Essential hypertension, benign      PLAN:  In order of problems listed above:  OSA - The patient is tolerating PAP therapy well without any problems. The PAP download performed by his DME was personally reviewed and interpreted by me today and showed an AHI of 3.6 /hr on 10 cm H2O with 93% compliance in using more than 4 hours nightly.  The patient has been using and benefiting from PAP use and will continue to benefit from therapy.  -I will  order her a new ResMed CPAP at 10cm H2O with heated humidity and mask of choice -check ONO on her new CPAP to make sure that she does not need supplemental O2 on CPAP  2.  Hypertension -BP borderline controlled on exam today -Continue prescription drug management with Toprol-XL 25 mg daily with as needed refills -I have asked her to check her BP twice daily for a week and send to Dr. Tenny Craw for review  Time Spent: 20 minutes total time of encounter, including 15 minutes spent in face-to-face patient care on the date of this encounter. This time includes coordination of care and counseling regarding above mentioned problem list. Remainder of non-face-to-face time involved reviewing chart documents/testing relevant to the patient encounter and documentation in the medical record. I have independently reviewed documentation from referring provider  Medication Adjustments/Labs and Tests Ordered: Current medicines are reviewed at length with the patient today.  Concerns regarding medicines are outlined above.  Medication changes, Labs and Tests ordered today are listed in the Patient Instructions below.  There are no Patient Instructions on file for this visit.   Signed, Armanda Magic, MD  03/26/2023 3:26 PM    [facility] [address] [place], [place], [place]  [address] Phone: [phone]; Fax: [phone]

## 2023-03-27 ENCOUNTER — Telehealth: Payer: Self-pay | Admitting: *Deleted

## 2023-03-27 DIAGNOSIS — G4733 Obstructive sleep apnea (adult) (pediatric): Secondary | ICD-10-CM

## 2023-03-27 DIAGNOSIS — I10 Essential (primary) hypertension: Secondary | ICD-10-CM

## 2023-03-27 NOTE — Telephone Encounter (Addendum)
Allison Reichert, MD order her a new ResMed CPAP at 10cm H2O with heated humidity and mask of choice and then will need followup 6 weeks after getting new device>>she needs this device ASAP as her CPAP has stopped working and she is dependent on it.  Also once she has the new PAP device please get an ONO on CPAP to make sure supplemental O2 is not needed  I will  order her a new ResMed CPAP at 10cm H2O with heated humidity and mask of choice  -check ONO on her new CPAP to make sure that she does not need supplemental O2 on CPAP  Upon patient request DME selection is [facility]. Patient understands he will be contacted by [facility] to set up his cpap. Patient understands to call if [facility] does not contact him with new setup in a timely manner. Patient understands they will be called once confirmation has been received from [facility]/ that they have received their new machine to schedule 10 week follow up appointment.   [facility] notified of new cpap order  Please add to airview Patient was grateful for the call and thanked me.

## 2023-04-04 NOTE — Telephone Encounter (Addendum)
Reached out to patient and informed her it takes 15 business to get a decision back from her insurance and the dme will contact her at that time.Patient understands his AHI showed normal. Pt is agreeable to treatment.

## 2023-04-04 NOTE — Telephone Encounter (Signed)
Pt calling to get update on cpap, requesting cb

## 2023-04-17 ENCOUNTER — Other Ambulatory Visit (HOSPITAL_COMMUNITY): Payer: Self-pay | Admitting: Psychiatry

## 2023-04-17 DIAGNOSIS — F3160 Bipolar disorder, current episode mixed, unspecified: Secondary | ICD-10-CM

## 2023-04-19 ENCOUNTER — Telehealth (HOSPITAL_COMMUNITY): Payer: Medicare Other | Admitting: Psychiatry

## 2023-04-27 ENCOUNTER — Telehealth (HOSPITAL_BASED_OUTPATIENT_CLINIC_OR_DEPARTMENT_OTHER): Payer: Medicare Other | Admitting: Psychiatry

## 2023-04-27 ENCOUNTER — Encounter (HOSPITAL_COMMUNITY): Payer: Self-pay | Admitting: Psychiatry

## 2023-04-27 VITALS — Wt 342.0 lb

## 2023-04-27 DIAGNOSIS — F3160 Bipolar disorder, current episode mixed, unspecified: Secondary | ICD-10-CM

## 2023-04-27 DIAGNOSIS — F5105 Insomnia due to other mental disorder: Secondary | ICD-10-CM

## 2023-04-27 DIAGNOSIS — F411 Generalized anxiety disorder: Secondary | ICD-10-CM

## 2023-04-27 MED ORDER — DULOXETINE HCL 60 MG PO CPEP
60.0000 mg | ORAL_CAPSULE | Freq: Every morning | ORAL | 2 refills | Status: DC
Start: 2023-04-27 — End: 2023-07-27

## 2023-04-27 MED ORDER — DIVALPROEX SODIUM 500 MG PO DR TAB: 1000.0000 mg | DELAYED_RELEASE_TABLET | Freq: Every day | ORAL | 2 refills | Status: DC

## 2023-04-27 MED ORDER — RISPERIDONE 1 MG PO TABS: 1.0000 mg | ORAL_TABLET | Freq: Every day | ORAL | 2 refills | Status: DC

## 2023-04-27 MED ORDER — RISPERIDONE 4 MG PO TABS: 4.0000 mg | ORAL_TABLET | Freq: Every day | ORAL | 2 refills | Status: DC

## 2023-04-27 NOTE — Progress Notes (Signed)
[facility] MD Virtual Progress Note   Patient Location: Home Provider Location: Home Office  I connect with patient by telephone and verified that I am speaking with correct person by using two identifiers. I discussed the limitations of evaluation and management by telemedicine and the availability of in person appointments. I also discussed with the patient that there may be a patient responsible charge related to this service. The patient expressed understanding and agreed to proceed.  Allison Sharp [id number] 67 y.o.  04/27/2023 9:15 AM  History of Present Illness:  Patient is evaluated by video session.  She is taking all her medication as prescribed.  She reported some anxiety because financial strain.  Recently they had to replace heater which caused her a lot and not sure how she is going to pay.  She does not drive.  She admitted sedentary lifestyle and gained weight since the last visit.  Now she had decided to see [facility].  She has appointment coming up on Monday.  She had no blood work including Depakote level in a while.  She denies hallucinations are not as bad.  She did get sometimes paranoid.  She is not sleeping well because her CPAP machine is not working.  Sometimes she talks to herself that calm her down.  She is not walking, exercising and gained weight.  She has mild tremors.  Her sister is doing better after the surgery.  Patient has chronic back pain and pain in the knee joint.  She denies any irritability, anger, suicidal thoughts.  She is taking Cymbalta or Depakote and risperidone.  She like to get Valium so she can sleep better and to help with anxiety.  She is not interested in therapy.  She denies any major panic attack.  Past Psychiatric History: H/O multiple inpatient. Last admissions in May 2020 at [facility]. H/O suicidal attempt by taking overdose.  Diagnosed with PTSD, depression, bipolar disorder.  Tried Paxil,  Prozac, Wellbutrin, Effexor, Lexapro, amitriptyline, Cymbalta, Abilify (Tremors), Neurontin, trazodone, Thorazine, hydroxyzine, Luvox, Sinequan, Zyprexa, Ambien and Latuda.  Best responded on Risperdal and Cymbalta.      Outpatient Encounter Medications as of 04/27/2023  Medication Sig   apixaban (ELIQUIS) 5 MG TABS tablet Take 5 mg by mouth 2 (two) times daily.   atorvastatin (LIPITOR) 20 MG tablet TAKE ONE TABLET AT BEDTIME   BREZTRI AEROSPHERE 160-9-4.8 MCG/ACT AERO daily. Per patient taking 1 puff daily   cetirizine (ZYRTEC) 10 MG tablet TAKE 1 TABLET ONCE DAILY   diclofenac sodium (VOLTAREN) 1 % GEL Apply to affected area twice daily as needed (Patient taking differently: Apply 2 g topically 2 (two) times daily as needed (PAIN).)   divalproex (DEPAKOTE) 500 MG DR tablet Take 2 tablets (1,000 mg total) by mouth at bedtime.   DULoxetine (CYMBALTA) 60 MG capsule Take 1 capsule (60 mg total) by mouth in the morning.   furosemide (LASIX) 20 MG tablet Take 20 mg by mouth as needed. Takes if gaines 3 pounds   levothyroxine (SYNTHROID) 125 MCG tablet TAKE 1 TABLET DAILY   metoprolol succinate (TOPROL-XL) 25 MG 24 hr tablet Take 1 tablet by mouth daily.   risperiDONE (RISPERDAL) 1 MG tablet Take 1 tablet (1 mg total) by mouth daily.   risperidone (RISPERDAL) 4 MG tablet Take 1 tablet (4 mg total) by mouth at bedtime.   traZODone (DESYREL) 100 MG tablet Take 1 tablet (100 mg total) by mouth at bedtime.   No facility-administered  encounter medications on file as of 04/27/2023.    No results found for this or any previous visit (from the past 2160 hour(s)).   Psychiatric Specialty Exam: Physical Exam  Review of Systems  Constitutional:        Weight gain  Psychiatric/Behavioral:  Positive for decreased concentration and sleep disturbance. The patient is nervous/anxious.     Weight (!) 342 lb (155.1 kg), last menstrual period 02/18/1995.There is no height or weight on file to calculate BMI.   General Appearance: Casual  Eye Contact:  Fair  Speech:  Slow  Volume:  Decreased  Mood:  Anxious and Dysphoric  Affect:  Constricted and Restricted  Thought Process:  Descriptions of Associations: Intact  Orientation:  Full (Time, Place, and Person)  Thought Content:  Rumination  Suicidal Thoughts:  No  Homicidal Thoughts:  No  Memory:  Immediate;   Fair Recent;   Fair Remote;   Fair  Judgement:  Intact  Insight:  Present  Psychomotor Activity:  Decreased and Tremor  Concentration:  Concentration: Fair and Attention Span: Fair  Recall:  [employer] of Knowledge:  Good  Language:  Good  Akathisia:  No  Handed:  Right  AIMS (if indicated):     Assets:  Communication Skills Desire for Improvement Housing Social Support  ADL's:  Intact  Cognition:  WNL  Sleep:  fair, not using CPAP     Assessment/Plan: Mixed bipolar I disorder (HCC) - Plan: risperiDONE (RISPERDAL) 1 MG tablet, risperidone (RISPERDAL) 4 MG tablet, DULoxetine (CYMBALTA) 60 MG capsule, divalproex (DEPAKOTE) 500 MG DR tablet  Generalized anxiety disorder - Plan: DULoxetine (CYMBALTA) 60 MG capsule  Insomnia due to mental disorder - Plan: divalproex (DEPAKOTE) 500 MG DR tablet  I reviewed current medication.  Patient already taking moderate dose of Risperdal along with Depakote and Cymbalta.  I encouraged to have blood work done including Depakote level and hemoglobin A1c.  Patient has appointment coming up on Monday.  She is hoping her CPAP machine fits so she can sleep better.  I recommend not to do any further medication adjustment at this time until we have Depakote level.  Encourage walking, watching her calorie intake.  She had gained weight in the past few months because her sedentary lifestyle.  Discussed financial strain as recently have to replace water heater.  I offered therapy but patient not interested.  She does not leave her house until it is required for doctor's appointment.  I encourage at least  walking in the house since she has a long deck at the back of the house.  Patient agreed that she will start walking in the house.  She admitted having some balance problem and does required cain when she go outside to avoid fall.  I encouraged keep the appointment and get the blood work.  I will also forward my note to her new PCP to get the Depakote level.  I discussed once I receive the blood work results and we will think if patient need to adjust the medication dose.  I do believe she need to use the CPAP once it is fixed that will help her sleep.  Follow up in 3 months unless patient required an earlier appointment.   Follow Up Instructions:     I discussed the assessment and treatment plan with the patient. The patient was provided an opportunity to ask questions and all were answered. The patient agreed with the plan and demonstrated an understanding of the instructions.  The patient was advised to call back or seek an in-person evaluation if the symptoms worsen or if the condition fails to improve as anticipated.    Collaboration of Care: Other provider involved in patient's care AEB notes are available in epic to review  Patient/Guardian was advised Release of Information must be obtained prior to any record release in order to collaborate their care with an outside provider. Patient/Guardian was advised if they have not already done so to contact the registration department to sign all necessary forms in order for [name] to release information regarding their care.   Consent: Patient/Guardian gives verbal consent for treatment and assignment of benefits for services provided during this visit. Patient/Guardian expressed understanding and agreed to proceed.     I provided 27 minutes of non face to face time during this encounter.  Note: This document was prepared by [employer] voice dictation technology and any errors that results from this process are unintentional.    Cleotis Nipper,  MD 04/27/2023

## 2023-04-29 NOTE — Progress Notes (Unsigned)
New Patient Office Visit  Subjective    Patient ID: Allison Sharp, female    DOB: 12/07/1955  Age: 67 y.o. MRN: [id number]  CC: No chief complaint on file.   HPI [name] presents to establish care ***  Outpatient Encounter Medications as of 04/30/2023  Medication Sig   apixaban (ELIQUIS) 5 MG TABS tablet Take 5 mg by mouth 2 (two) times daily.   atorvastatin (LIPITOR) 20 MG tablet TAKE ONE TABLET AT BEDTIME   BREZTRI AEROSPHERE 160-9-4.8 MCG/ACT AERO daily. Per patient taking 1 puff daily   cetirizine (ZYRTEC) 10 MG tablet TAKE 1 TABLET ONCE DAILY   divalproex (DEPAKOTE) 500 MG DR tablet Take 2 tablets (1,000 mg total) by mouth at bedtime.   DULoxetine (CYMBALTA) 60 MG capsule Take 1 capsule (60 mg total) by mouth in the morning.   furosemide (LASIX) 20 MG tablet Take 20 mg by mouth as needed. Takes if gaines 3 pounds   levothyroxine (SYNTHROID) 125 MCG tablet TAKE 1 TABLET DAILY   metoprolol succinate (TOPROL-XL) 25 MG 24 hr tablet Take 1 tablet by mouth daily.   risperiDONE (RISPERDAL) 1 MG tablet Take 1 tablet (1 mg total) by mouth daily.   risperidone (RISPERDAL) 4 MG tablet Take 1 tablet (4 mg total) by mouth at bedtime.   traZODone (DESYREL) 100 MG tablet Take 1 tablet (100 mg total) by mouth at bedtime.   No facility-administered encounter medications on file as of 04/30/2023.    Past Medical History:  Diagnosis Date   Allergy    Anxiety    Arthritis    Asthma    Bipolar 1 disorder (HCC)    CAD (coronary artery disease)    Cellulitis    CHF (congestive heart failure) (HCC)    diastolic dysfunction   Chronic back pain    Chronic headaches    Complication of anesthesia    States she typically gets sick s/p anesthesia   Contusion of sacrum    COPD (chronic obstructive pulmonary disease) (HCC)    Dementia (HCC)    Depression    Dyspnea    PFT 03/05/09 FEV1 2.77(98%), FVC 3.25(86%), FEV1% 85, TLC 5.88(99%), DLCO 60% ,  Methacholine challenge 03/16/09  normal ,  CT chest 03/12/09 no pulmonary disease   Fungal infection    GERD (gastroesophageal reflux disease)    History of colonoscopy 10/17/2002   by Dr Karilyn Cota distal non-specific proctitis, small ext hemorrhoids,    HTN (hypertension)    Hyperlipidemia    Hyperthyroidism    radioactive - thyroid cancer    Migraine headache    Morbid obesity with body mass index of 50.0-59.9 in adult (HCC) JAN 2011 370 LBS   2004 311 BMI 45.9   Myocardial infarction (HCC)    NOV 1997   OSA on CPAP    she had been on 2L O2 at night but that was stopped   PONV (postoperative nausea and vomiting)    PTSD (post-traumatic stress disorder)    PTSD (post-traumatic stress disorder)    Suicidal ideation    Urine incontinence    Vitamin D deficiency     Past Surgical History:  Procedure Laterality Date   ABDOMINAL HYSTERECTOMY     sept 1996   APPENDECTOMY     BACK SURGERY  2008   CARDIAC CATHETERIZATION     nov 1997   CHOLECYSTECTOMY     COLONOSCOPY  10/17/2002    Distal proctitis, small external hemorrhoids, otherwise/  normal colonoscopy.  Suspect rectal bleeding secondary to hemorrhoids   ESOPHAGOGASTRODUODENOSCOPY  03/18/09   fundic gland polyps/mild gastritis   HERNIA REPAIR  1978   JOINT REPLACEMENT     bil knee replacement   KNEE ARTHROSCOPY     LEFT HEART CATH AND CORONARY ANGIOGRAPHY N/A 08/08/2018   Procedure: LEFT HEART CATH AND CORONARY ANGIOGRAPHY;  Surgeon: Corky Crafts, MD;  Location: [facility] INVASIVE CV LAB;  Service: Cardiovascular;  Laterality: N/A;   MULTIPLE EXTRACTIONS WITH ALVEOLOPLASTY N/A 08/16/2015   Procedure: EXTRACTION OF TEETH THREE, SIX, EIGHT, NINE, ELEVEN, FOURTEEN, FIFTEEN, TWENTY-EIGHT WITH ALVEOLOPLASTY;  Surgeon: Ocie Doyne, DDS;  Location: [facility] OR;  Service: Oral Surgery;  Laterality: N/A;   ROTATOR CUFF REPAIR Right    TONSILLECTOMY     TOTAL VAGINAL HYSTERECTOMY     TUBAL LIGATION      Family History  Adopted: Yes  Problem Relation Age of Onset    Hypertension Mother    Bipolar disorder Mother    Dementia Mother    Depression Mother    AAA (abdominal aortic aneurysm) Mother    Coronary artery disease Father    Alcohol abuse Father    Hypertension Brother    Coronary artery disease Brother    Bipolar disorder Brother    Depression Brother    Depression Sister    Paranoid behavior Sister    Cancer Sister        breast   Bipolar disorder Sister    Depression Sister    Hypertension Sister    Arthritis Sister        knee and hand    Irregular heart beat Sister        takes eliquis    Cancer Son        thyroid   Drug abuse Son    Alcohol abuse Son    Cancer Maternal Grandfather        throat    Cancer Maternal Aunt        breast metastatized to brain   Anesthesia problems Neg Hx    Hypotension Neg Hx    Malignant hyperthermia Neg Hx    Pseudochol deficiency Neg Hx     Social History   Socioeconomic History   Marital status: Single    Spouse name: Not on file   Number of children: 2   Years of education: Not on file   Highest education level: Not on file  Occupational History   Occupation: Disabled    Employer: UNEMPLOYED    Comment: back problems  Tobacco Use   Smoking status: Never   Smokeless tobacco: Never  Vaping Use   Vaping status: Never Used  Substance and Sexual Activity   Alcohol use: No    Alcohol/week: 0.0 standard drinks of alcohol   Drug use: No   Sexual activity: Never    Birth control/protection: Abstinence  Other Topics Concern   Not on file  Social History Narrative   [name]  is disabled and lives with her sister.    She has two grown sons that she does not see often, as they live in other states. She has 5 grand children.   She has a long history of mental illness including depression, PTSD, suicidal and homicidal ideation.   She has been obese most all of her life. Her weight has significantly impacted her QOL. She recently lost 20 lbs. By decreasing portion size & increasing  proteins.    Social Determinants of Corporate investment banker  Strain: Medium Risk (12/15/2020)   Received from [facility], [facility]   Overall Financial Resource Strain (CARDIA)    Difficulty of Paying Living Expenses: Somewhat hard  Food Insecurity: Food Insecurity Present (12/15/2020)   Received from [facility], [facility]   Hunger Vital Sign    Worried About Running Out of Food in the Last Year: Sometimes true    Ran Out of Food in the Last Year: Sometimes true  Transportation Needs: No Transportation Needs (12/15/2020)   Received from [facility], [facility]   [facility] - Transportation    Lack of Transportation (Medical): No    Lack of Transportation (Non-Medical): No  Physical Activity: Not on file  Stress: Not on file  Social Connections: Unknown (10/08/2021)   Received from [facility], [facility]   Social Network    Social Network: Not on file  Intimate Partner Violence: Unknown (08/31/2021)   Received from [facility], [facility]   HITS    Physically Hurt: Not on file    Insult or Talk Down To: Not on file    Threaten Physical Harm: Not on file    Scream or Curse: Not on file    ROS Negative unless indicated in HPI   Objective    LMP 02/18/1995   Physical Exam  Last CBC Lab Results  Component Value Date   WBC 8.1 07/14/2021   HGB 14.8 07/14/2021   HCT 44.5 07/14/2021   MCV 93 07/14/2021   MCH 31.0 07/14/2021   RDW 12.9 07/14/2021   PLT 193 07/14/2021   Last metabolic panel Lab Results  Component Value Date   GLUCOSE 99 07/14/2021   NA 144 07/14/2021   K 4.5 07/14/2021   CL 103 07/14/2021   CO2 23 07/14/2021   BUN 9 07/14/2021   CREATININE 0.98 07/14/2021   EGFR 63 07/14/2021   CALCIUM 10.1 07/14/2021   PHOS 3.3 09/27/2017   PROT 6.5 07/14/2020   ALBUMIN 4.2 07/14/2020   LABGLOB 2.3 07/14/2020   AGRATIO 1.8 07/14/2020   BILITOT 0.4 07/14/2020   ALKPHOS 82 07/14/2020   AST 16 07/14/2020   ALT 12  07/14/2020   ANIONGAP 11 10/14/2018   Last lipids Lab Results  Component Value Date   CHOL 155 07/14/2020   HDL 38 (L) 07/14/2020   LDLCALC 84 07/14/2020   TRIG 191 (H) 07/14/2020   CHOLHDL 4.1 07/14/2020   Last hemoglobin A1c Lab Results  Component Value Date   HGBA1C 5.3 08/08/2018   Last thyroid functions Lab Results  Component Value Date   TSH 3.420 07/14/2020   T3TOTAL 91.9 01/07/2012   T4TOTAL 8.5 09/25/2018        Assessment & Plan:  There are no diagnoses linked to this encounter. Continue healthy lifestyle choices, including diet (rich in fruits, vegetables, and lean proteins, and low in salt and simple carbohydrates) and exercise (at least 30 minutes of moderate physical activity daily).     The above assessment and management plan was discussed with the patient. The patient verbalized understanding of and has agreed to the management plan. Patient is aware to call the clinic if they develop any new symptoms or if symptoms persist or worsen. Patient is aware when to return to the clinic for a follow-up visit. Patient educated on when it is appropriate to go to the emergency department.  No follow-ups on file.   Arrie Aran Santa Lighter, [facility] [address]  [address] [place], [place] [address] [phone]  Note: This document was prepared by Dragon voice dictation technology and any errors that results from this process are unintentional.

## 2023-04-30 ENCOUNTER — Ambulatory Visit: Payer: Medicare Other | Admitting: Nurse Practitioner

## 2023-05-03 ENCOUNTER — Ambulatory Visit: Payer: Medicare Other | Admitting: Nurse Practitioner

## 2023-05-09 ENCOUNTER — Ambulatory Visit: Payer: Medicare Other | Admitting: Emergency Medicine

## 2023-05-14 ENCOUNTER — Ambulatory Visit: Payer: Medicare Other | Admitting: Emergency Medicine

## 2023-05-28 ENCOUNTER — Telehealth: Payer: Self-pay | Admitting: Internal Medicine

## 2023-05-28 NOTE — Telephone Encounter (Signed)
I saw patient as does T Turner  She was started on risperidol by another physician Need to follow her EKG  Her QT was a little long in past      Please have her come in for ekg

## 2023-05-28 NOTE — Telephone Encounter (Signed)
Spoke with patient and she will come in on Friday at 3 for EKG

## 2023-06-01 ENCOUNTER — Ambulatory Visit: Payer: Medicare Other

## 2023-06-01 ENCOUNTER — Telehealth: Payer: Self-pay | Admitting: Internal Medicine

## 2023-06-01 NOTE — Telephone Encounter (Signed)
 Patient states she has diarrhea and would need to rescheduled. She has been rescheduled.

## 2023-06-01 NOTE — Telephone Encounter (Signed)
 Pt is calling to r/s nurse visit for today at 3:00

## 2023-06-04 ENCOUNTER — Ambulatory Visit: Payer: Medicare Other

## 2023-06-04 ENCOUNTER — Telehealth: Payer: Self-pay | Admitting: Internal Medicine

## 2023-06-04 NOTE — Telephone Encounter (Signed)
 Nurse visit per pt request rescheduled for 06/11/23... she needs an ECG to check her QT due to taking Risperdal ... the pt declined coming in this week after I urged her not to put off too much longer... she will monitor for any palpitations and if any changes will call us  and let us  know.

## 2023-06-04 NOTE — Telephone Encounter (Signed)
 Patient is calling to get her Nurse Visit for 1/6 rescheduled due to weather.

## 2023-06-11 ENCOUNTER — Ambulatory Visit: Payer: Medicare Other

## 2023-06-11 ENCOUNTER — Telehealth: Payer: Self-pay | Admitting: Internal Medicine

## 2023-06-11 NOTE — Telephone Encounter (Signed)
 Returned call. Moved nurse visit to 1/14.

## 2023-06-11 NOTE — Telephone Encounter (Signed)
 Pt would like a c/b to reschedule Nurse visit that was for this afternoon at 2 pm. Please advise

## 2023-06-12 ENCOUNTER — Ambulatory Visit: Payer: Medicare Other | Attending: Cardiology

## 2023-06-12 VITALS — BP 138/86 | HR 84 | Resp 11 | Wt 342.0 lb

## 2023-06-12 DIAGNOSIS — Z79899 Other long term (current) drug therapy: Secondary | ICD-10-CM

## 2023-06-12 DIAGNOSIS — I25118 Atherosclerotic heart disease of native coronary artery with other forms of angina pectoris: Secondary | ICD-10-CM

## 2023-06-12 NOTE — Progress Notes (Signed)
   Nurse Visit   Date of Encounter: 06/12/2023 ID: Allison Sharp, DOB 02-27-1956, MRN [id number]  PCP:  Allison Emil Schanz, MD   [facility] Providers Cardiologist:  Allison Gull, MD      Visit Details   VS:  BP 138/86 (BP Location: Right Arm, Patient Position: Sitting, Cuff Size: Large)   Pulse 84   Resp 11   LMP 02/18/1995   SpO2 97%  , BMI There is no height or weight on file to calculate BMI.  Wt Readings from Last 3 Encounters:  03/26/23 (!) 342 lb (155.1 kg)  01/18/22 (!) 341 lb (154.7 kg)  07/14/21 (!) 343 lb (155.6 kg)     Reason for visit: ECG to check her intervals for med management Performed today: Vitals, EKG, Provider consulted:Dr Allison Sharp, and Education Changes (medications, testing, etc.) : no change in meds  Length of Visit: 15 minutes    Medications Adjustments/Labs and Tests Ordered: Orders Placed This Encounter  Procedures   EKG 12-Lead   No orders of the defined types were placed in this encounter.   Pt needed ECG after starting Risperdal  with her Psychiatric MD for sleep... need to check her QT per Dr Sharp.   QT today 386/456... improved from her previous ECG. NO change in her meds.    Allison Jenkins CHRISTELLA Acquanetta, RN  06/12/2023 3:34 PM

## 2023-06-12 NOTE — Patient Instructions (Signed)
 Medication Instructions:   *If you need a refill on your cardiac medications before your next appointment, please call your pharmacy*   Lab Work:  If you have labs (blood work) drawn today and your tests are completely normal, you will receive your results only by: MyChart Message (if you have MyChart) OR A paper copy in the mail If you have any lab test that is abnormal or we need to change your treatment, we will call you to review the results.   Testing/Procedures:    Follow-Up: At [facility], you and your health needs are our priority.  As part of our continuing mission to provide you with exceptional heart care, we have created designated Provider Care Teams.  These Care Teams include your primary Cardiologist (physician) and Advanced Practice Providers (APPs -  Physician Assistants and Nurse Practitioners) who all work together to provide you with the care you need, when you need it.  We recommend signing up for the patient portal called "MyChart".  Sign up information is provided on this After Visit Summary.  MyChart is used to connect with patients for Virtual Visits (Telemedicine).  Patients are able to view lab/test results, encounter notes, upcoming appointments, etc.  Non-urgent messages can be sent to your provider as well.   To learn more about what you can do with MyChart, go to [email or web address].

## 2023-06-17 NOTE — Progress Notes (Deleted)
New Patient Office Visit  Subjective   Patient ID: Allison Sharp, female    DOB: 01/05/1956  Age: 68 y.o. MRN: [id number]  CC: No chief complaint on file.   HPI Allison Sharp presents to establish care ***  Outpatient Encounter Medications as of 06/21/2023  Medication Sig   apixaban (ELIQUIS) 5 MG TABS tablet Take 5 mg by mouth 2 (two) times daily.   atorvastatin (LIPITOR) 20 MG tablet TAKE ONE TABLET AT BEDTIME   BREZTRI AEROSPHERE 160-9-4.8 MCG/ACT AERO daily. Per patient taking 1 puff daily   cetirizine (ZYRTEC) 10 MG tablet TAKE 1 TABLET ONCE DAILY   divalproex (DEPAKOTE) 500 MG DR tablet Take 2 tablets (1,000 mg total) by mouth at bedtime.   DULoxetine (CYMBALTA) 60 MG capsule Take 1 capsule (60 mg total) by mouth in the morning.   furosemide (LASIX) 20 MG tablet Take 20 mg by mouth as needed. Takes if gaines 3 pounds   levothyroxine (SYNTHROID) 125 MCG tablet TAKE 1 TABLET DAILY   metoprolol succinate (TOPROL-XL) 25 MG 24 hr tablet Take 1 tablet by mouth daily.   risperiDONE (RISPERDAL) 1 MG tablet Take 1 tablet (1 mg total) by mouth daily.   risperidone (RISPERDAL) 4 MG tablet Take 1 tablet (4 mg total) by mouth at bedtime.   traZODone (DESYREL) 100 MG tablet Take 1 tablet (100 mg total) by mouth at bedtime.   No facility-administered encounter medications on file as of 06/21/2023.    Past Medical History:  Diagnosis Date   Allergy    Anxiety    Arthritis    Asthma    Bipolar 1 disorder (HCC)    CAD (coronary artery disease)    Cellulitis    CHF (congestive heart failure) (HCC)    diastolic dysfunction   Chronic back pain    Chronic headaches    Complication of anesthesia    States she typically gets sick s/p anesthesia   Contusion of sacrum    COPD (chronic obstructive pulmonary disease) (HCC)    Dementia (HCC)    Depression    Dyspnea    PFT 03/05/09 FEV1 2.77(98%), FVC 3.25(86%), FEV1% 85, TLC 5.88(99%), DLCO 60% ,  Methacholine challenge 03/16/09  normal ,  CT chest 03/12/09 no pulmonary disease   Fungal infection    GERD (gastroesophageal reflux disease)    History of colonoscopy 10/17/2002   by Dr Karilyn Cota distal non-specific proctitis, small ext hemorrhoids,    HTN (hypertension)    Hyperlipidemia    Hyperthyroidism    radioactive - thyroid cancer    Migraine headache    Morbid obesity with body mass index of 50.0-59.9 in adult (HCC) JAN 2011 370 LBS   2004 311 BMI 45.9   Myocardial infarction (HCC)    NOV 1997   OSA on CPAP    she had been on 2L O2 at night but that was stopped   PONV (postoperative nausea and vomiting)    PTSD (post-traumatic stress disorder)    PTSD (post-traumatic stress disorder)    Suicidal ideation    Urine incontinence    Vitamin D deficiency     Past Surgical History:  Procedure Laterality Date   ABDOMINAL HYSTERECTOMY     sept 1996   APPENDECTOMY     BACK SURGERY  2008   CARDIAC CATHETERIZATION     nov 1997   CHOLECYSTECTOMY     COLONOSCOPY  10/17/2002    Distal proctitis, small external hemorrhoids, otherwise/  normal colonoscopy. Suspect  rectal bleeding secondary to hemorrhoids   ESOPHAGOGASTRODUODENOSCOPY  03/18/09   fundic gland polyps/mild gastritis   HERNIA REPAIR  1978   JOINT REPLACEMENT     bil knee replacement   KNEE ARTHROSCOPY     LEFT HEART CATH AND CORONARY ANGIOGRAPHY N/A 08/08/2018   Procedure: LEFT HEART CATH AND CORONARY ANGIOGRAPHY;  Surgeon: Corky Crafts, MD;  Location: [facility] INVASIVE CV LAB;  Service: Cardiovascular;  Laterality: N/A;   MULTIPLE EXTRACTIONS WITH ALVEOLOPLASTY N/A 08/16/2015   Procedure: EXTRACTION OF TEETH THREE, SIX, EIGHT, NINE, ELEVEN, FOURTEEN, FIFTEEN, TWENTY-EIGHT WITH ALVEOLOPLASTY;  Surgeon: Ocie Doyne, DDS;  Location: [facility] OR;  Service: Oral Surgery;  Laterality: N/A;   ROTATOR CUFF REPAIR Right    TONSILLECTOMY     TOTAL VAGINAL HYSTERECTOMY     TUBAL LIGATION      Family History  Adopted: Yes  Problem Relation Age of Onset    Hypertension Mother    Bipolar disorder Mother    Dementia Mother    Depression Mother    AAA (abdominal aortic aneurysm) Mother    Coronary artery disease Father    Alcohol abuse Father    Hypertension Brother    Coronary artery disease Brother    Bipolar disorder Brother    Depression Brother    Depression Sister    Paranoid behavior Sister    Cancer Sister        breast   Bipolar disorder Sister    Depression Sister    Hypertension Sister    Arthritis Sister        knee and hand    Irregular heart beat Sister        takes eliquis    Cancer Son        thyroid   Drug abuse Son    Alcohol abuse Son    Cancer Maternal Grandfather        throat    Cancer Maternal Aunt        breast metastatized to brain   Anesthesia problems Neg Hx    Hypotension Neg Hx    Malignant hyperthermia Neg Hx    Pseudochol deficiency Neg Hx     Social History   Socioeconomic History   Marital status: Single    Spouse name: Not on file   Number of children: 2   Years of education: Not on file   Highest education level: Not on file  Occupational History   Occupation: Disabled    Employer: UNEMPLOYED    Comment: back problems  Tobacco Use   Smoking status: Never   Smokeless tobacco: Never  Vaping Use   Vaping status: Never Used  Substance and Sexual Activity   Alcohol use: No    Alcohol/week: 0.0 standard drinks of alcohol   Drug use: No   Sexual activity: Never    Birth control/protection: Abstinence  Other Topics Concern   Not on file  Social History Narrative   Ms.Cheatham  is disabled and lives with her sister.    She has two grown sons that she does not see often, as they live in other states. She has 5 grand children.   She has a long history of mental illness including depression, PTSD, suicidal and homicidal ideation.   She has been obese most all of her life. Her weight has significantly impacted her QOL. She recently lost 20 lbs. By decreasing portion size & increasing  proteins.    Social Drivers of Corporate investment banker Strain:  Medium Risk (12/15/2020)   Received from [facility], [facility]   Overall Financial Resource Strain (CARDIA)    Difficulty of Paying Living Expenses: Somewhat hard  Food Insecurity: Food Insecurity Present (12/15/2020)   Received from [facility], [facility]   Hunger Vital Sign    Worried About Running Out of Food in the Last Year: Sometimes true    Ran Out of Food in the Last Year: Sometimes true  Transportation Needs: No Transportation Needs (12/15/2020)   Received from [facility], [facility]   [facility] - Transportation    Lack of Transportation (Medical): No    Lack of Transportation (Non-Medical): No  Physical Activity: Not on file  Stress: Not on file  Social Connections: Unknown (10/08/2021)   Received from [facility], [facility]   Social Network    Social Network: Not on file  Intimate Partner Violence: Unknown (08/31/2021)   Received from [facility], [facility]   HITS    Physically Hurt: Not on file    Insult or Talk Down To: Not on file    Threaten Physical Harm: Not on file    Scream or Curse: Not on file    ROS Negative unless indicated in HPI    Objective   LMP 02/18/1995   Physical Exam  Last CBC Lab Results  Component Value Date   WBC 8.1 07/14/2021   HGB 14.8 07/14/2021   HCT 44.5 07/14/2021   MCV 93 07/14/2021   MCH 31.0 07/14/2021   RDW 12.9 07/14/2021   PLT 193 07/14/2021   Last metabolic panel Lab Results  Component Value Date   GLUCOSE 99 07/14/2021   NA 144 07/14/2021   K 4.5 07/14/2021   CL 103 07/14/2021   CO2 23 07/14/2021   BUN 9 07/14/2021   CREATININE 0.98 07/14/2021   EGFR 63 07/14/2021   CALCIUM 10.1 07/14/2021   PHOS 3.3 09/27/2017   PROT 6.5 07/14/2020   ALBUMIN 4.2 07/14/2020   LABGLOB 2.3 07/14/2020   AGRATIO 1.8 07/14/2020   BILITOT 0.4 07/14/2020   ALKPHOS 82 07/14/2020   AST 16 07/14/2020   ALT 12 07/14/2020    ANIONGAP 11 10/14/2018   Last lipids Lab Results  Component Value Date   CHOL 155 07/14/2020   HDL 38 (L) 07/14/2020   LDLCALC 84 07/14/2020   TRIG 191 (H) 07/14/2020   CHOLHDL 4.1 07/14/2020   Last hemoglobin A1c Lab Results  Component Value Date   HGBA1C 5.3 08/08/2018   Last thyroid functions Lab Results  Component Value Date   TSH 3.420 07/14/2020   T3TOTAL 91.9 01/07/2012   T4TOTAL 8.5 09/25/2018        Assessment & Plan:  There are no diagnoses linked to this encounter. Continue healthy lifestyle choices, including diet (rich in fruits, vegetables, and lean proteins, and low in salt and simple carbohydrates) and exercise (at least 30 minutes of moderate physical activity daily).     The above assessment and management plan was discussed with the patient. The patient verbalized understanding of and has agreed to the management plan. Patient is aware to call the clinic if they develop any new symptoms or if symptoms persist or worsen. Patient is aware when to return to the clinic for a follow-up visit. Patient educated on when it is appropriate to go to the emergency department.  No follow-ups on file.   Arrie Aran Santa Lighter, [facility] [address]  [address] [place], [place] [address] [phone]  Note: This document was prepared by Dragon voice dictation technology and any errors that results from this process are unintentional.

## 2023-06-21 ENCOUNTER — Ambulatory Visit: Payer: Medicare Other | Admitting: Nurse Practitioner

## 2023-06-30 ENCOUNTER — Other Ambulatory Visit (HOSPITAL_COMMUNITY): Payer: Self-pay | Admitting: Psychiatry

## 2023-06-30 DIAGNOSIS — F3160 Bipolar disorder, current episode mixed, unspecified: Secondary | ICD-10-CM

## 2023-07-02 ENCOUNTER — Encounter: Payer: Self-pay | Admitting: Emergency Medicine

## 2023-07-02 ENCOUNTER — Ambulatory Visit: Payer: Medicare Other | Admitting: Emergency Medicine

## 2023-07-02 VITALS — BP 134/94 | HR 79 | Temp 98.0°F | Ht 69.0 in | Wt 346.0 lb

## 2023-07-02 DIAGNOSIS — I5032 Chronic diastolic (congestive) heart failure: Secondary | ICD-10-CM

## 2023-07-02 DIAGNOSIS — G4733 Obstructive sleep apnea (adult) (pediatric): Secondary | ICD-10-CM

## 2023-07-02 DIAGNOSIS — K5909 Other constipation: Secondary | ICD-10-CM

## 2023-07-02 DIAGNOSIS — Z131 Encounter for screening for diabetes mellitus: Secondary | ICD-10-CM

## 2023-07-02 DIAGNOSIS — E039 Hypothyroidism, unspecified: Secondary | ICD-10-CM

## 2023-07-02 DIAGNOSIS — I10 Essential (primary) hypertension: Secondary | ICD-10-CM | POA: Diagnosis not present

## 2023-07-02 DIAGNOSIS — Z130 Encounter for screening for diseases of the blood and blood-forming organs and certain disorders involving the immune mechanism: Secondary | ICD-10-CM

## 2023-07-02 DIAGNOSIS — Z8673 Personal history of transient ischemic attack (TIA), and cerebral infarction without residual deficits: Secondary | ICD-10-CM | POA: Diagnosis not present

## 2023-07-02 DIAGNOSIS — E785 Hyperlipidemia, unspecified: Secondary | ICD-10-CM

## 2023-07-02 DIAGNOSIS — Z1211 Encounter for screening for malignant neoplasm of colon: Secondary | ICD-10-CM

## 2023-07-02 DIAGNOSIS — J438 Other emphysema: Secondary | ICD-10-CM | POA: Diagnosis not present

## 2023-07-02 DIAGNOSIS — F315 Bipolar disorder, current episode depressed, severe, with psychotic features: Secondary | ICD-10-CM

## 2023-07-02 DIAGNOSIS — I890 Lymphedema, not elsewhere classified: Secondary | ICD-10-CM | POA: Diagnosis not present

## 2023-07-02 DIAGNOSIS — Z0001 Encounter for general adult medical examination with abnormal findings: Secondary | ICD-10-CM | POA: Diagnosis not present

## 2023-07-02 DIAGNOSIS — Z1329 Encounter for screening for other suspected endocrine disorder: Secondary | ICD-10-CM

## 2023-07-02 DIAGNOSIS — I25118 Atherosclerotic heart disease of native coronary artery with other forms of angina pectoris: Secondary | ICD-10-CM

## 2023-07-02 DIAGNOSIS — D693 Immune thrombocytopenic purpura: Secondary | ICD-10-CM

## 2023-07-02 LAB — CBC WITH DIFFERENTIAL/PLATELET
Basophils Absolute: 0 10*3/uL (ref 0.0–0.1)
Basophils Relative: 0.6 % (ref 0.0–3.0)
Eosinophils Absolute: 0.1 10*3/uL (ref 0.0–0.7)
Eosinophils Relative: 1.7 % (ref 0.0–5.0)
HCT: 43.1 % (ref 36.0–46.0)
Hemoglobin: 14.4 g/dL (ref 12.0–15.0)
Lymphocytes Relative: 27.2 % (ref 12.0–46.0)
Lymphs Abs: 1.8 10*3/uL (ref 0.7–4.0)
MCHC: 33.4 g/dL (ref 30.0–36.0)
MCV: 93.6 fL (ref 78.0–100.0)
Monocytes Absolute: 0.6 10*3/uL (ref 0.1–1.0)
Monocytes Relative: 8.9 % (ref 3.0–12.0)
Neutro Abs: 4.2 10*3/uL (ref 1.4–7.7)
Neutrophils Relative %: 61.6 % (ref 43.0–77.0)
Platelets: 162 10*3/uL (ref 150.0–400.0)
RBC: 4.6 Mil/uL (ref 3.87–5.11)
RDW: 13.5 % (ref 11.5–15.5)
WBC: 6.7 10*3/uL (ref 4.0–10.5)

## 2023-07-02 LAB — LIPID PANEL
Cholesterol: 171 mg/dL (ref 0–200)
HDL: 45.4 mg/dL (ref >39.00)
LDL Cholesterol: 92 mg/dL (ref 0–99)
NonHDL: 125.29
Total CHOL/HDL Ratio: 4
Triglycerides: 168 mg/dL — ABNORMAL HIGH (ref 0.0–149.0)
VLDL: 33.6 mg/dL (ref 0.0–40.0)

## 2023-07-02 LAB — COMPREHENSIVE METABOLIC PANEL
ALT: 10 U/L (ref 0–35)
AST: 17 U/L (ref 0–37)
Albumin: 4.2 g/dL (ref 3.5–5.2)
Alkaline Phosphatase: 66 U/L (ref 39–117)
BUN: 14 mg/dL (ref 6–23)
CO2: 30 meq/L (ref 19–32)
Calcium: 9.5 mg/dL (ref 8.4–10.5)
Chloride: 103 meq/L (ref 96–112)
Creatinine, Ser: 0.84 mg/dL (ref 0.40–1.20)
GFR: 70.93 mL/min (ref >60.00)
Glucose, Bld: 92 mg/dL (ref 70–99)
Potassium: 4.4 meq/L (ref 3.5–5.1)
Sodium: 139 meq/L (ref 135–145)
Total Bilirubin: 0.5 mg/dL (ref 0.2–1.2)
Total Protein: 6.8 g/dL (ref 6.0–8.3)

## 2023-07-02 LAB — TSH: TSH: 2.91 u[IU]/mL (ref 0.35–5.50)

## 2023-07-02 LAB — HEMOGLOBIN A1C: Hgb A1c MFr Bld: 5.5 % (ref 4.6–6.5)

## 2023-07-02 MED ORDER — LINACLOTIDE 145 MCG PO CAPS: 145.0000 ug | ORAL_CAPSULE | Freq: Every day | ORAL | 3 refills | Status: AC

## 2023-07-02 MED ORDER — FUROSEMIDE 20 MG PO TABS: ORAL_TABLET | ORAL | 3 refills | Status: AC

## 2023-07-02 NOTE — Assessment & Plan Note (Signed)
Stable.  Sees psychiatrist on a regular basis. Medications handled by them.

## 2023-07-02 NOTE — Assessment & Plan Note (Signed)
 Diet and nutrition discussed.  Advised to decrease amount of daily carbohydrate intake and daily calories and increase amount of plant-based protein in her diet.

## 2023-07-02 NOTE — Assessment & Plan Note (Signed)
 Stable.  No concerns.

## 2023-07-02 NOTE — Assessment & Plan Note (Signed)
Stable.  Continue Eliquis 5 mg daily. Secondary stroke prevention measures discussed

## 2023-07-02 NOTE — Assessment & Plan Note (Signed)
Well-controlled hypertension Continue metoprolol succinate 25 mg daily and Lasix 20 mg daily

## 2023-07-02 NOTE — Assessment & Plan Note (Signed)
No signs or symptoms of acute CHF. Continue Lasix 20 mg daily.

## 2023-07-02 NOTE — Assessment & Plan Note (Signed)
Stable.  Continue daily Trelegy. 

## 2023-07-02 NOTE — Assessment & Plan Note (Signed)
 Stable on CPAP

## 2023-07-02 NOTE — Patient Instructions (Signed)

## 2023-07-02 NOTE — Assessment & Plan Note (Signed)
Stable chronic condition. Continue atorvastatin 20 mg daily.

## 2023-07-02 NOTE — Progress Notes (Signed)
Allison Sharp 68 y.o.   Chief Complaint  Patient presents with   Annual Exam    LOV 01/18/2022. Patient states having a stroke 2 years ago and is seeing a cardiologist. Wants a refill for lasix 20mg , linzess, and something for sinus. Referral for Endoscopy     HISTORY OF PRESENT ILLNESS: This is a 68 y.o. female here for annual exam and follow-up on multiple chronic medical conditions Chronic medical conditions limiting her ability to move around and walk more but she is trying States she is eating better No other complaints or medical concerns today.   HPI   Prior to Admission medications   Medication Sig Start Date End Date Taking? Authorizing Provider  apixaban (ELIQUIS) 5 MG TABS tablet Take 5 mg by mouth 2 (two) times daily.   Yes [provider]  atorvastatin (LIPITOR) 20 MG tablet TAKE ONE TABLET AT BEDTIME 04/29/22  Yes Aberdeen Hafen, Eilleen Kempf, MD  BREZTRI AEROSPHERE 160-9-4.8 MCG/ACT AERO daily. Per patient taking 1 puff daily   Yes [provider]  divalproex (DEPAKOTE) 500 MG DR tablet Take 2 tablets (1,000 mg total) by mouth at bedtime. 04/27/23  Yes Arfeen, Phillips Grout, MD  DULoxetine (CYMBALTA) 60 MG capsule Take 1 capsule (60 mg total) by mouth in the morning. 04/27/23  Yes Arfeen, Phillips Grout, MD  furosemide (LASIX) 20 MG tablet Take 20 mg by mouth as needed. Takes if gaines 3 pounds 07/15/20  Yes Dettinger, Elige Radon, MD  levothyroxine (SYNTHROID) 125 MCG tablet TAKE 1 TABLET DAILY 03/11/21  Yes Dettinger, Elige Radon, MD  metoprolol succinate (TOPROL-XL) 25 MG 24 hr tablet Take 1 tablet by mouth daily. 01/25/21  Yes [provider]  omeprazole (PRILOSEC) 20 MG capsule Take 40 mg by mouth daily. 04/30/23  Yes [provider]  risperiDONE (RISPERDAL) 1 MG tablet Take 1 tablet (1 mg total) by mouth daily. 04/27/23 07/26/23 Yes Arfeen, Phillips Grout, MD  risperidone (RISPERDAL) 4 MG tablet Take 1 tablet (4 mg total) by mouth at bedtime. 04/27/23 07/26/23 Yes  Arfeen, Phillips Grout, MD  traZODone (DESYREL) 100 MG tablet Take 1 tablet (100 mg total) by mouth at bedtime. 05/04/22  Yes Arfeen, Phillips Grout, MD  cetirizine (ZYRTEC) 10 MG tablet TAKE 1 TABLET ONCE DAILY Patient not taking: Reported on 07/02/2023 12/17/20   Dettinger, Elige Radon, MD    Allergies  Allergen Reactions   Abilify [Aripiprazole] Other (See Comments)    hallucinations   Naproxen Nausea And Vomiting and Swelling   Ativan [Lorazepam] Other (See Comments)    Delirium   Haldol [Haloperidol] Other (See Comments)    Hallucinating    Hydroxyzine Other (See Comments)    hallucinations    Patient Active Problem List   Diagnosis Date Noted   History of stroke 01/19/2022   Chronic idiopathic thrombocytopenia (HCC) 12/17/2020   Diastolic congestive heart failure (HCC) 12/17/2020   Chronic acquired lymphedema 02/07/2019   Allergies 10/17/2018   Memory loss 09/16/2018   Prolonged QT interval 08/06/2018   Chronic obstructive pulmonary disease (HCC) 05/06/2018   Fatty liver 10/24/2017   Splenic varices 10/22/2017   Thrombocytopenia (HCC) 09/29/2017   Visual hallucinations    Sacroiliac pain 11/13/2016   Bipolar I disorder, most recent episode depressed, severe w psychosis (HCC) 06/21/2016   History of MI (myocardial infarction) 06/21/2016   Hypertension 06/21/2016   Spinal stenosis of lumbar region with neurogenic claudication 03/29/2016   Severe obesity (BMI >= 40) (HCC) 07/05/2015   Hyperlipidemia LDL goal <  130 07/05/2015   MDD (major depressive disorder), single episode, severe with psychotic features (HCC) 01/21/2015   Bilateral knee pain 01/07/2014   Scar condition and fibrosis of skin 12/11/2013   Essential hypertension, benign 07/01/2013   OSA on CPAP 11/04/2012   Back pain, chronic 08/27/2012   Vitamin D insufficiency 04/04/2012   Insomnia due to mental disorder 04/04/2012   Anxiety 04/04/2012   OCD (obsessive compulsive disorder) 04/04/2012   PTSD (post-traumatic stress  disorder) 06/28/2011   Coronary artery disease 08/26/2010   GERD 03/01/2009   Morbid obesity (HCC) 02/15/2009   Hypothyroidism 02/12/2009    Past Medical History:  Diagnosis Date   Allergy    Anxiety    Arthritis    Asthma    Bipolar 1 disorder (HCC)    CAD (coronary artery disease)    Cellulitis    CHF (congestive heart failure) (HCC)    diastolic dysfunction   Chronic back pain    Chronic headaches    Complication of anesthesia    States she typically gets sick s/p anesthesia   Contusion of sacrum    COPD (chronic obstructive pulmonary disease) (HCC)    Dementia (HCC)    Depression    Dyspnea    PFT 03/05/09 FEV1 2.77(98%), FVC 3.25(86%), FEV1% 85, TLC 5.88(99%), DLCO 60% ,  Methacholine challenge 03/16/09 normal ,  CT chest 03/12/09 no pulmonary disease   Fungal infection    GERD (gastroesophageal reflux disease)    History of colonoscopy 10/17/2002   by Dr Karilyn Cota distal non-specific proctitis, small ext hemorrhoids,    HTN (hypertension)    Hyperlipidemia    Hyperthyroidism    radioactive - thyroid cancer    Migraine headache    Morbid obesity with body mass index of 50.0-59.9 in adult (HCC) JAN 2011 370 LBS   2004 311 BMI 45.9   Myocardial infarction (HCC)    NOV 1997   OSA on CPAP    she had been on 2L O2 at night but that was stopped   PONV (postoperative nausea and vomiting)    PTSD (post-traumatic stress disorder)    PTSD (post-traumatic stress disorder)    Suicidal ideation    Urine incontinence    Vitamin D deficiency     Past Surgical History:  Procedure Laterality Date   ABDOMINAL HYSTERECTOMY     sept 1996   APPENDECTOMY     BACK SURGERY  2008   CARDIAC CATHETERIZATION     nov 1997   CHOLECYSTECTOMY     COLONOSCOPY  10/17/2002    Distal proctitis, small external hemorrhoids, otherwise/  normal colonoscopy. Suspect rectal bleeding secondary to hemorrhoids   ESOPHAGOGASTRODUODENOSCOPY  03/18/09   fundic gland polyps/mild gastritis   HERNIA  REPAIR  1978   JOINT REPLACEMENT     bil knee replacement   KNEE ARTHROSCOPY     LEFT HEART CATH AND CORONARY ANGIOGRAPHY N/A 08/08/2018   Procedure: LEFT HEART CATH AND CORONARY ANGIOGRAPHY;  Surgeon: Corky Crafts, MD;  Location: [facility] INVASIVE CV LAB;  Service: Cardiovascular;  Laterality: N/A;   MULTIPLE EXTRACTIONS WITH ALVEOLOPLASTY N/A 08/16/2015   Procedure: EXTRACTION OF TEETH THREE, SIX, EIGHT, NINE, ELEVEN, FOURTEEN, FIFTEEN, TWENTY-EIGHT WITH ALVEOLOPLASTY;  Surgeon: Ocie Doyne, DDS;  Location: [facility] OR;  Service: Oral Surgery;  Laterality: N/A;   ROTATOR CUFF REPAIR Right    TONSILLECTOMY     TOTAL VAGINAL HYSTERECTOMY     TUBAL LIGATION      Social History   Socioeconomic History  Marital status: Single    Spouse name: Not on file   Number of children: 2   Years of education: Not on file   Highest education level: Not on file  Occupational History   Occupation: Disabled    Employer: UNEMPLOYED    Comment: back problems  Tobacco Use   Smoking status: Never   Smokeless tobacco: Never  Vaping Use   Vaping status: Never Used  Substance and Sexual Activity   Alcohol use: No    Alcohol/week: 0.0 standard drinks of alcohol   Drug use: No   Sexual activity: Never    Birth control/protection: Abstinence  Other Topics Concern   Not on file  Social History Narrative   Ms.Fiorentino  is disabled and lives with her sister.    She has two grown sons that she does not see often, as they live in other states. She has 5 grand children.   She has a long history of mental illness including depression, PTSD, suicidal and homicidal ideation.   She has been obese most all of her life. Her weight has significantly impacted her QOL. She recently lost 20 lbs. By decreasing portion size & increasing proteins.    Social Drivers of Health   Financial Resource Strain: Medium Risk (12/15/2020)   Received from [facility], [facility]   Overall Financial Resource Strain (CARDIA)     Difficulty of Paying Living Expenses: Somewhat hard  Food Insecurity: Food Insecurity Present (12/15/2020)   Received from [facility], [facility]   Hunger Vital Sign    Worried About Running Out of Food in the Last Year: Sometimes true    Ran Out of Food in the Last Year: Sometimes true  Transportation Needs: No Transportation Needs (12/15/2020)   Received from [facility], [facility]   [facility] - Transportation    Lack of Transportation (Medical): No    Lack of Transportation (Non-Medical): No  Physical Activity: Not on file  Stress: Not on file  Social Connections: Unknown (10/08/2021)   Received from [facility], [facility]   Social Network    Social Network: Not on file  Intimate Partner Violence: Unknown (08/31/2021)   Received from [facility], [facility]   HITS    Physically Hurt: Not on file    Insult or Talk Down To: Not on file    Threaten Physical Harm: Not on file    Scream or Curse: Not on file    Family History  Adopted: Yes  Problem Relation Age of Onset   Hypertension Mother    Bipolar disorder Mother    Dementia Mother    Depression Mother    AAA (abdominal aortic aneurysm) Mother    Coronary artery disease Father    Alcohol abuse Father    Hypertension Brother    Coronary artery disease Brother    Bipolar disorder Brother    Depression Brother    Depression Sister    Paranoid behavior Sister    Cancer Sister        breast   Bipolar disorder Sister    Depression Sister    Hypertension Sister    Arthritis Sister        knee and hand    Irregular heart beat Sister        takes eliquis    Cancer Son        thyroid   Drug abuse Son    Alcohol abuse Son  Cancer Maternal Grandfather        throat    Cancer Maternal Aunt        breast metastatized to brain   Anesthesia problems Neg Hx    Hypotension Neg Hx    Malignant hyperthermia Neg Hx    Pseudochol deficiency Neg Hx      Review of Systems  Constitutional:  Negative.  Negative for chills and fever.  HENT: Negative.  Negative for congestion and sore throat.   Respiratory: Negative.  Negative for cough and shortness of breath.   Cardiovascular: Negative.  Negative for chest pain and palpitations.  Gastrointestinal:  Positive for constipation. Negative for abdominal pain, diarrhea, nausea and vomiting.  Genitourinary: Negative.  Negative for dysuria.  Skin: Negative.  Negative for rash.  Neurological: Negative.  Negative for dizziness and headaches.  All other systems reviewed and are negative.   Vitals:   07/02/23 1050  BP: (!) 134/94  Pulse: 79  Temp: 98 F (36.7 C)  SpO2: 96%    Physical Exam Constitutional:      Appearance: She is obese.  HENT:     Head: Normocephalic.     Mouth/Throat:     Mouth: Mucous membranes are moist.     Pharynx: Oropharynx is clear.  Eyes:     Extraocular Movements: Extraocular movements intact.     Pupils: Pupils are equal, round, and reactive to light.  Cardiovascular:     Rate and Rhythm: Normal rate and regular rhythm.     Pulses: Normal pulses.     Heart sounds: Normal heart sounds.  Pulmonary:     Effort: Pulmonary effort is normal.     Breath sounds: Normal breath sounds.  Abdominal:     Palpations: Abdomen is soft.     Tenderness: There is no abdominal tenderness.  Musculoskeletal:     Cervical back: No tenderness.     Right lower leg: Edema present.     Left lower leg: Edema present.     Comments: Chronic lymphedema lower extremities  Lymphadenopathy:     Cervical: No cervical adenopathy.  Neurological:     General: No focal deficit present.     Mental Status: She is alert and oriented to person, place, and time. Mental status is at baseline.  Psychiatric:        Mood and Affect: Mood normal.        Behavior: Behavior normal.      ASSESSMENT & PLAN: Problem List Items Addressed This Visit       Cardiovascular and Mediastinum   Essential hypertension, benign (Chronic)    Well-controlled hypertension Continue metoprolol succinate 25 mg daily and Lasix 20 mg daily       Relevant Medications   furosemide (LASIX) 20 MG tablet   Other Relevant Orders   Comprehensive metabolic panel   Coronary artery disease   Stable.  No recent anginal episodes. Current continue metoprolol succinate 25 mg daily. Sees cardiologist on a regular basis.      Relevant Medications   furosemide (LASIX) 20 MG tablet   Diastolic congestive heart failure (HCC)   No signs or symptoms of acute CHF. Continue Lasix 20 mg daily.      Relevant Medications   furosemide (LASIX) 20 MG tablet     Respiratory   OSA on CPAP   Stable on CPAP.      Chronic obstructive pulmonary disease (HCC)   Stable.  Continue daily Trelegy.        Endocrine  Hypothyroidism (Chronic)   Clinically euthyroid. Continue Synthroid 125 mcg daily.      Relevant Orders   TSH     Musculoskeletal and Integument   Chronic idiopathic thrombocytopenia (HCC)   Stable.  No concerns.      Relevant Orders   CBC with Differential/Platelet     Other   Morbid obesity (HCC) (Chronic)   Diet and nutrition discussed. Advised to decrease amount of daily carbohydrate intake and daily calories and increase amount of plant based protein in her diet.      Relevant Orders   Comprehensive metabolic panel   Hemoglobin A1c   Lipid panel   TSH   Dyslipidemia   Stable chronic condition Continue atorvastatin 20 mg daily      Relevant Orders   Comprehensive metabolic panel   Hemoglobin A1c   Lipid panel   Bipolar I disorder, most recent episode depressed, severe w psychosis (HCC)   Stable.  Sees psychiatrist on a regular basis. Medications handled by them.      Relevant Orders   Valproic Acid level   Chronic acquired lymphedema   Chronic and stable.      History of stroke   Stable.  Continue Eliquis 5 mg daily. Secondary stroke prevention measures discussed      Other Visit Diagnoses        Encounter for general adult medical examination with abnormal findings    -  Primary   Relevant Orders   Comprehensive metabolic panel   CBC with Differential/Platelet   Hemoglobin A1c   Lipid panel   TSH   Valproic Acid level   Ambulatory referral to Gastroenterology     Screening for deficiency anemia       Relevant Orders   CBC with Differential/Platelet     Screening for endocrine, metabolic and immunity disorder       Relevant Orders   Comprehensive metabolic panel   Hemoglobin A1c     Chronic constipation       Relevant Medications   linaclotide (LINZESS) 145 MCG CAPS capsule     Screening for colon cancer       Relevant Orders   Ambulatory referral to Gastroenterology          Modifiable risk factors discussed with patient. Anticipatory guidance according to age provided. The following topics were also discussed: Social Determinants of Health Smoking. Non-smoker Diet and nutrition Benefits of exercise Cancer screening and need for colan and breast cancer screening Vaccinations review and recommendations Cardiovascular risk assessment The 10-year ASCVD risk score (Arnett DK, et al., 2019) is: 12%   Values used to calculate the score:     Age: 41 years     Sex: Female     Is Non-Hispanic African American: No     Diabetic: No     Tobacco smoker: No     Systolic Blood Pressure: 134 mmHg     Is BP treated: Yes     HDL Cholesterol: 43 mg/dL     Total Cholesterol: 154 mg/dL  Mental health including depression and anxiety Fall and accident prevention  Patient Instructions  Health Maintenance, Female Adopting a healthy lifestyle and getting preventive care are important in promoting health and wellness. Ask your health care provider about: The right schedule for you to have regular tests and exams. Things you can do on your own to prevent diseases and keep yourself healthy. What should I know about diet, weight, and exercise? Eat a healthy diet  Eat a  diet  that includes plenty of vegetables, fruits, low-fat dairy products, and lean protein. Do not eat a lot of foods that are high in solid fats, added sugars, or sodium. Maintain a healthy weight Body mass index (BMI) is used to identify weight problems. It estimates body fat based on height and weight. Your health care provider can help determine your BMI and help you achieve or maintain a healthy weight. Get regular exercise Get regular exercise. This is one of the most important things you can do for your health. Most adults should: Exercise for at least 150 minutes each week. The exercise should increase your heart rate and make you sweat (moderate-intensity exercise). Do strengthening exercises at least twice a week. This is in addition to the moderate-intensity exercise. Spend less time sitting. Even light physical activity can be beneficial. Watch cholesterol and blood lipids Have your blood tested for lipids and cholesterol at 70 years of age, then have this test every 5 years. Have your cholesterol levels checked more often if: Your lipid or cholesterol levels are high. You are older than 70 years of age. You are at high risk for heart disease. What should I know about cancer screening? Depending on your health history and family history, you may need to have cancer screening at various ages. This may include screening for: Breast cancer. Cervical cancer. Colorectal cancer. Skin cancer. Lung cancer. What should I know about heart disease, diabetes, and high blood pressure? Blood pressure and heart disease High blood pressure causes heart disease and increases the risk of stroke. This is more likely to develop in people who have high blood pressure readings or are overweight. Have your blood pressure checked: Every 3-5 years if you are 71-65 years of age. Every year if you are 7 years old or older. Diabetes Have regular diabetes screenings. This checks your fasting blood sugar  level. Have the screening done: Once every three years after age 12 if you are at a normal weight and have a low risk for diabetes. More often and at a younger age if you are overweight or have a high risk for diabetes. What should I know about preventing infection? Hepatitis B If you have a higher risk for hepatitis B, you should be screened for this virus. Talk with your health care provider to find out if you are at risk for hepatitis B infection. Hepatitis C Testing is recommended for: Everyone born from [birth year] through 1967. Anyone with known risk factors for hepatitis C. Sexually transmitted infections (STIs) Get screened for STIs, including gonorrhea and chlamydia, if: You are sexually active and are younger than 70 years of age. You are older than 69 years of age and your health care provider tells you that you are at risk for this type of infection. Your sexual activity has changed since you were last screened, and you are at increased risk for chlamydia or gonorrhea. Ask your health care provider if you are at risk. Ask your health care provider about whether you are at high risk for HIV. Your health care provider may recommend a prescription medicine to help prevent HIV infection. If you choose to take medicine to prevent HIV, you should first get tested for HIV. You should then be tested every 3 months for as long as you are taking the medicine. Pregnancy If you are about to stop having your period (premenopausal) and you may become pregnant, seek counseling before you get pregnant. Take 400 to 800 micrograms (mcg) of folic  acid every day if you become pregnant. Ask for birth control (contraception) if you want to prevent pregnancy. Osteoporosis and menopause Osteoporosis is a disease in which the bones lose minerals and strength with aging. This can result in bone fractures. If you are 85 years old or older, or if you are at risk for osteoporosis and fractures, ask your health care  provider if you should: Be screened for bone loss. Take a calcium or vitamin D supplement to lower your risk of fractures. Be given hormone replacement therapy (HRT) to treat symptoms of menopause. Follow these instructions at home: Alcohol use Do not drink alcohol if: Your health care provider tells you not to drink. You are pregnant, may be pregnant, or are planning to become pregnant. If you drink alcohol: Limit how much you have to: 0-1 drink a day. Know how much alcohol is in your drink. In the [place] one drink equals one 12 oz bottle of beer (355 mL), one 5 oz glass of wine (148 mL), or one 1 oz glass of hard liquor (44 mL). Lifestyle Do not use any products that contain nicotine or tobacco. These products include cigarettes, chewing tobacco, and vaping devices, such as e-cigarettes. If you need help quitting, ask your health care provider. Do not use street drugs. Do not share needles. Ask your health care provider for help if you need support or information about quitting drugs. General instructions Schedule regular health, dental, and eye exams. Stay current with your vaccines. Tell your health care provider if: You often feel depressed. You have ever been abused or do not feel safe at home. Summary Adopting a healthy lifestyle and getting preventive care are important in promoting health and wellness. Follow your health care provider's instructions about healthy diet, exercising, and getting tested or screened for diseases. Follow your health care provider's instructions on monitoring your cholesterol and blood pressure. This information is not intended to replace advice given to you by your health care provider. Make sure you discuss any questions you have with your health care provider. Document Revised: 10/04/2020 Document Reviewed: 10/04/2020 Elsevier Patient Education  2024 Elsevier Inc.    Edwina Barth, MD [facility]

## 2023-07-02 NOTE — Assessment & Plan Note (Signed)
 Clinically euthyroid. Continue Synthroid 125 mcg daily.

## 2023-07-02 NOTE — Assessment & Plan Note (Signed)
 Chronic and stable.

## 2023-07-02 NOTE — Assessment & Plan Note (Signed)
Stable.  No recent anginal episodes. Current continue metoprolol succinate 25 mg daily. Sees cardiologist on a regular basis.

## 2023-07-03 LAB — VALPROIC ACID LEVEL: Valproic Acid Lvl: 65.1 mg/L (ref 50.0–100.0)

## 2023-07-05 ENCOUNTER — Ambulatory Visit: Payer: Medicare Other | Admitting: Nurse Practitioner

## 2023-07-13 ENCOUNTER — Other Ambulatory Visit (HOSPITAL_COMMUNITY): Payer: Self-pay | Admitting: Psychiatry

## 2023-07-13 DIAGNOSIS — F3160 Bipolar disorder, current episode mixed, unspecified: Secondary | ICD-10-CM

## 2023-07-27 ENCOUNTER — Encounter (HOSPITAL_COMMUNITY): Payer: Self-pay | Admitting: Psychiatry

## 2023-07-27 ENCOUNTER — Telehealth (HOSPITAL_COMMUNITY): Payer: Medicare Other | Admitting: Psychiatry

## 2023-07-27 VITALS — Wt 346.0 lb

## 2023-07-27 DIAGNOSIS — F3160 Bipolar disorder, current episode mixed, unspecified: Secondary | ICD-10-CM

## 2023-07-27 DIAGNOSIS — F411 Generalized anxiety disorder: Secondary | ICD-10-CM

## 2023-07-27 DIAGNOSIS — F5105 Insomnia due to other mental disorder: Secondary | ICD-10-CM

## 2023-07-27 MED ORDER — RISPERIDONE 3 MG PO TABS: 6.0000 mg | ORAL_TABLET | Freq: Every day | ORAL | 0 refills | Status: DC

## 2023-07-27 MED ORDER — TRAZODONE HCL 100 MG PO TABS: 100.0000 mg | ORAL_TABLET | Freq: Every day | ORAL | 0 refills | Status: DC

## 2023-07-27 MED ORDER — DIVALPROEX SODIUM 500 MG PO DR TAB: 1000.0000 mg | DELAYED_RELEASE_TABLET | Freq: Every day | ORAL | 0 refills | Status: DC

## 2023-07-27 MED ORDER — DULOXETINE HCL 60 MG PO CPEP: 60.0000 mg | ORAL_CAPSULE | Freq: Every morning | ORAL | 0 refills | Status: DC

## 2023-07-27 NOTE — Progress Notes (Signed)
 [facility] MD Virtual Progress Note   Patient Location: Home Provider Location: Office  I connect with patient by telephone and verified that I am speaking with correct person by using two identifiers. I discussed the limitations of evaluation and management by telemedicine and the availability of in person appointments. I also discussed with the patient that there may be a patient responsible charge related to this service. The patient expressed understanding and agreed to proceed.  Allison Sharp [id number] 68 y.o.  07/27/2023 10:02 AM  History of Present Illness:  Patient is evaluated by phone session.  She do not want to do video session.  She reported things are not going very well.  She is feeling very sad depressed and has no motivation to do things.  She stays most of the time at home with her sister.  Her sister had suggested to go out but she refused to go.  She admitted a lot of anxiety, irritability, frustration but denies any suicidal thoughts.  Sometimes she feels hopeless and worthless.  Recently she had a labs and Depakote level 65.  She has mild tremors but they are chronic.  She also had a lot of chronic back pain but not happy as not getting any pain medication.  She refused to see therapist.  She does not walk, exercise or watch her calorie intake.  Sometimes she does not care about her weight gain.  She like to go to hospital to change her medication.  She admitted hallucination, paranoia but they are chronic.  She want Valium.  She is compliant with Cymbalta, Depakote, risperidone and trazodone.  Despite taking all these medication she is only getting 4 hours sleep.  She admitted easily get short temper.  She denies drinking or using any illegal substances.  Past Psychiatric History: H/O multiple inpatient. Last admissions in May 2020 at [facility]. H/O suicidal attempt by taking overdose.  Diagnosed with PTSD, depression, bipolar disorder.  Tried  Paxil, Prozac, Wellbutrin, Effexor, Lexapro, amitriptyline, Cymbalta, Abilify (Tremors), Neurontin, trazodone, Thorazine, hydroxyzine, Luvox, Sinequan, Zyprexa, Ambien and Latuda.  Best responded on Risperdal and Cymbalta.      Outpatient Encounter Medications as of 07/27/2023  Medication Sig   apixaban (ELIQUIS) 5 MG TABS tablet Take 5 mg by mouth 2 (two) times daily.   atorvastatin (LIPITOR) 20 MG tablet TAKE ONE TABLET AT BEDTIME   BREZTRI AEROSPHERE 160-9-4.8 MCG/ACT AERO daily. Per patient taking 1 puff daily   cetirizine (ZYRTEC) 10 MG tablet TAKE 1 TABLET ONCE DAILY (Patient not taking: Reported on 07/02/2023)   divalproex (DEPAKOTE) 500 MG DR tablet Take 2 tablets (1,000 mg total) by mouth at bedtime.   DULoxetine (CYMBALTA) 60 MG capsule Take 1 capsule (60 mg total) by mouth in the morning.   furosemide (LASIX) 20 MG tablet Take 20 mg by mouth as needed. Takes if gaines 3 pounds   levothyroxine (SYNTHROID) 125 MCG tablet TAKE 1 TABLET DAILY   linaclotide (LINZESS) 145 MCG CAPS capsule Take 1 capsule (145 mcg total) by mouth daily before breakfast.   metoprolol succinate (TOPROL-XL) 25 MG 24 hr tablet Take 1 tablet by mouth daily.   omeprazole (PRILOSEC) 20 MG capsule Take 40 mg by mouth daily.   risperiDONE (RISPERDAL) 1 MG tablet Take 1 tablet (1 mg total) by mouth daily.   risperidone (RISPERDAL) 4 MG tablet Take 1 tablet (4 mg total) by mouth at bedtime.   traZODone (DESYREL) 100 MG tablet Take 1 tablet (100 mg total) by  mouth at bedtime.   No facility-administered encounter medications on file as of 07/27/2023.    Recent Results (from the past 2160 hours)  Valproic Acid level     Status: None   Collection Time: 07/02/23 11:46 AM  Result Value Ref Range   Valproic Acid Lvl 65.1 50.0 - 100.0 mg/L  TSH     Status: None   Collection Time: 07/02/23 11:46 AM  Result Value Ref Range   TSH 2.91 0.35 - 5.50 uIU/mL  Lipid panel     Status: Abnormal   Collection Time: 07/02/23 11:46 AM   Result Value Ref Range   Cholesterol 171 0 - 200 mg/dL    Comment: ATP III Classification       Desirable:  < 200 mg/dL               Borderline High:  200 - 239 mg/dL          High:  > = 161 mg/dL   Triglycerides 096.0 (H) 0.0 - 149.0 mg/dL    Comment: Normal:  <454 mg/dLBorderline High:  150 - 199 mg/dL   HDL 09.81 >19.14 mg/dL   VLDL 78.2 0.0 - 95.6 mg/dL   LDL Cholesterol 92 0 - 99 mg/dL   Total CHOL/HDL Ratio 4     Comment:                Men          Women1/2 Average Risk     3.4          3.3Average Risk          5.0          4.42X Average Risk          9.6          7.13X Average Risk          15.0          11.0                       NonHDL 125.29     Comment: NOTE:  Non-HDL goal should be 30 mg/dL higher than patient's LDL goal (i.e. LDL goal of < 70 mg/dL, would have non-HDL goal of < 100 mg/dL)  Hemoglobin O1H     Status: None   Collection Time: 07/02/23 11:46 AM  Result Value Ref Range   Hgb A1c MFr Bld 5.5 4.6 - 6.5 %    Comment: Glycemic Control Guidelines for People with Diabetes:Non Diabetic:  <6%Goal of Therapy: <7%Additional Action Suggested:  >8%   CBC with Differential/Platelet     Status: None   Collection Time: 07/02/23 11:46 AM  Result Value Ref Range   WBC 6.7 4.0 - 10.5 K/uL   RBC 4.60 3.87 - 5.11 Mil/uL   Hemoglobin 14.4 12.0 - 15.0 g/dL   HCT 08.6 57.8 - 46.9 %   MCV 93.6 78.0 - 100.0 fl   MCHC 33.4 30.0 - 36.0 g/dL   RDW 62.9 52.8 - 41.3 %   Platelets 162.0 150.0 - 400.0 K/uL   Neutrophils Relative % 61.6 43.0 - 77.0 %   Lymphocytes Relative 27.2 12.0 - 46.0 %   Monocytes Relative 8.9 3.0 - 12.0 %   Eosinophils Relative 1.7 0.0 - 5.0 %   Basophils Relative 0.6 0.0 - 3.0 %   Neutro Abs 4.2 1.4 - 7.7 K/uL   Lymphs Abs 1.8 0.7 - 4.0 K/uL   Monocytes Absolute 0.6 0.1 -  1.0 K/uL   Eosinophils Absolute 0.1 0.0 - 0.7 K/uL   Basophils Absolute 0.0 0.0 - 0.1 K/uL  Comprehensive metabolic panel     Status: None   Collection Time: 07/02/23 11:46 AM  Result  Value Ref Range   Sodium 139 135 - 145 mEq/L   Potassium 4.4 3.5 - 5.1 mEq/L   Chloride 103 96 - 112 mEq/L   CO2 30 19 - 32 mEq/L   Glucose, Bld 92 70 - 99 mg/dL   BUN 14 6 - 23 mg/dL   Creatinine, Ser 6.96 0.40 - 1.20 mg/dL   Total Bilirubin 0.5 0.2 - 1.2 mg/dL   Alkaline Phosphatase 66 39 - 117 U/L   AST 17 0 - 37 U/L   ALT 10 0 - 35 U/L   Total Protein 6.8 6.0 - 8.3 g/dL   Albumin 4.2 3.5 - 5.2 g/dL   GFR 29.52 >84.13 mL/min    Comment: Calculated using the CKD-EPI Creatinine Equation (2021)   Calcium 9.5 8.4 - 10.5 mg/dL     Psychiatric Specialty Exam: Physical Exam  Review of Systems  Musculoskeletal:  Positive for back pain.  Neurological:  Positive for numbness.    Weight (!) 346 lb (156.9 kg), last menstrual period 02/18/1995.There is no height or weight on file to calculate BMI.  General Appearance: NA  Eye Contact:  NA  Speech:  Slow  Volume:  Decreased  Mood:  Dysphoric and Irritable  Affect:  NA  Thought Process:  Descriptions of Associations: Intact  Orientation:  Full (Time, Place, and Person)  Thought Content:  Hallucinations: Auditory, Paranoid Ideation, and Rumination  Suicidal Thoughts:  No  Homicidal Thoughts:  No  Memory:  Immediate;   Fair Recent;   Fair Remote;   Fair  Judgement:  Fair  Insight:  Shallow  Psychomotor Activity:  Decreased and Tremor  Concentration:  Concentration: Fair and Attention Span: Fair  Recall:  Good  Fund of Knowledge:  Fair  Language:  Good  Akathisia:  No  Handed:  Right  AIMS (if indicated):     Assets:  Communication Skills Desire for Improvement Housing Social Support  ADL's:  Intact  Cognition:  WNL  Sleep:  4 hrs       07/27/2023   10:15 AM 07/02/2023   10:58 AM 03/23/2022    2:17 PM 01/18/2022    2:09 PM 07/28/2020   10:54 AM  Depression screen PHQ 2/9  Decreased Interest 2 3 3  0 1  Down, Depressed, Hopeless 2 3 3 1  0  PHQ - 2 Score 4 6 6 1 1   Altered sleeping 2 3 3     Tired, decreased energy  3 3     Change in appetite 0 0 1    Feeling bad or failure about yourself  1 3 3     Trouble concentrating 1 3 3     Moving slowly or fidgety/restless 1 0 0    Suicidal thoughts 0 0 0    PHQ-9 Score 9 18 19     Difficult doing work/chores Somewhat difficult Not difficult at all Somewhat difficult      Flowsheet Row Video Visit from 07/27/2023 in [facility] Counselor from 03/23/2022 in [facility] [place] Video Visit from 08/09/2020 in [facility]  C-SSRS RISK CATEGORY Low Risk No Risk No Risk       Assessment/Plan: Mixed bipolar I disorder (HCC) - Plan: divalproex (DEPAKOTE) 500 MG DR tablet,  DULoxetine (CYMBALTA) 60 MG capsule, risperidone (RISPERDAL) 3 MG tablet, traZODone (DESYREL) 100 MG tablet  Insomnia due to mental disorder - Plan: divalproex (DEPAKOTE) 500 MG DR tablet, traZODone (DESYREL) 100 MG tablet  Generalized anxiety disorder - Plan: DULoxetine (CYMBALTA) 60 MG capsule, traZODone (DESYREL) 100 MG tablet  I reviewed blood work results.  Hemoglobin A1c normal.  Labs are stable and Depakote 65.  No new medication added by primary care.  She is using CPAP and taking trazodone but is still getting 4 hours sleep.  She wants to go to hospital to change her medication.  When asked about suicidal thoughts patient denied and reported she does not want to take her life.  She wants these hallucinations to get under control.  She reported these hallucinations are like running commentary but no command hallucinations.  She is refusing to go outside, refusing to get therapy, refusing to watch her calorie intake and walking.  I discussed we can optimize the dose of risperidone from 5 mg to 6 mg to help these hallucinations.  I also emphasized that she need to make effort to engage in her daily activities.  I explained risk and benefits of the hospitalization.  In the past she had tried  numerous medication but current combination work better.  I explained hoping increase risperidone dose may help her mood and irritability.  Patient agreed to give a try.  She like to get Valium however I deferred due to polypharmacy, risk of cognitive impairment, fall risk.  She is not happy but agreed to give a try to higher respite on.  She will keep the Cymbalta 60 mg and trazodone 100 mg along with Depakote 1000 mg at bedtime.  Encouraged to use CPAP every night.  Follow-up in 2 weeks.  Recommend to call 911 if any time having active suicidal thoughts or homicidal thoughts.  I also emphasized to have in person visit in 2 weeks.  Follow Up Instructions:     I discussed the assessment and treatment plan with the patient. The patient was provided an opportunity to ask questions and all were answered. The patient agreed with the plan and demonstrated an understanding of the instructions.   The patient was advised to call back or seek an in-person evaluation if the symptoms worsen or if the condition fails to improve as anticipated.    Collaboration of Care: Other provider involved in patient's care AEB notes are available in epic to review  Patient/Guardian was advised Release of Information must be obtained prior to any record release in order to collaborate their care with an outside provider. Patient/Guardian was advised if they have not already done so to contact the registration department to sign all necessary forms in order for [name] to release information regarding their care.   Consent: Patient/Guardian gives verbal consent for treatment and assignment of benefits for services provided during this visit. Patient/Guardian expressed understanding and agreed to proceed.     I provided 25 minutes of non face to face time during this encounter.  Note: This document was prepared by [employer] voice dictation technology and any errors that results from this process are unintentional.    Cleotis Nipper, MD 07/27/2023

## 2023-08-08 ENCOUNTER — Encounter (HOSPITAL_COMMUNITY): Payer: Self-pay | Admitting: Psychiatry

## 2023-08-08 ENCOUNTER — Telehealth (HOSPITAL_BASED_OUTPATIENT_CLINIC_OR_DEPARTMENT_OTHER): Payer: Medicare Other | Admitting: Psychiatry

## 2023-08-08 VITALS — Wt 346.0 lb

## 2023-08-08 DIAGNOSIS — F3160 Bipolar disorder, current episode mixed, unspecified: Secondary | ICD-10-CM | POA: Diagnosis not present

## 2023-08-08 DIAGNOSIS — F5105 Insomnia due to other mental disorder: Secondary | ICD-10-CM

## 2023-08-08 DIAGNOSIS — F411 Generalized anxiety disorder: Secondary | ICD-10-CM

## 2023-08-08 MED ORDER — DULOXETINE HCL 60 MG PO CPEP: 60.0000 mg | ORAL_CAPSULE | Freq: Every morning | ORAL | 1 refills | Status: DC

## 2023-08-08 MED ORDER — DIVALPROEX SODIUM 500 MG PO DR TAB: 1000.0000 mg | DELAYED_RELEASE_TABLET | Freq: Every day | ORAL | 1 refills | Status: DC

## 2023-08-08 MED ORDER — TRAZODONE HCL 100 MG PO TABS: 100.0000 mg | ORAL_TABLET | Freq: Every day | ORAL | 1 refills | Status: DC

## 2023-08-08 MED ORDER — DULOXETINE HCL 20 MG PO CPEP: 20.0000 mg | ORAL_CAPSULE | Freq: Every day | ORAL | 1 refills | Status: DC

## 2023-08-08 MED ORDER — RISPERIDONE 3 MG PO TABS: 6.0000 mg | ORAL_TABLET | Freq: Every day | ORAL | 1 refills | Status: DC

## 2023-08-08 NOTE — Progress Notes (Signed)
 [facility] MD Virtual Progress Note   Patient Location: Home Provider Location: Home Office  I connect with patient by telephone and verified that I am speaking with correct person by using two identifiers. I discussed the limitations of evaluation and management by telemedicine and the availability of in person appointments. I also discussed with the patient that there may be a patient responsible charge related to this service. The patient expressed understanding and agreed to proceed.  Allison Sharp [id number] 68 y.o.  08/08/2023 9:04 AM  History of Present Illness:  Patient is evaluated by phone session.  We have requested in person visit but patient could not come.  She cannot do video as not familiar with technology.  On the last visit we increased risperidone because she struggled with hallucination, self-neglect, insomnia and feeling very sad and depressed.  She also have a lot of pain and not able to get the pain medicine.  We have recommended to see her primary care and patient is trying to get appointment.  She noticed increasing respite on how to help some of her hallucination, paranoia.  She did go to grocery store with her sister and she feels good.  However she still struggle with chronic fatigue, tiredness, insomnia and anxiety.  She denies any suicidal thoughts and her hallucinations are not as intense.  She is taking Depakote, Risperdal, trazodone and Cymbalta.  She has no tremors, shakes or any EPS.  She is trying to watch her calorie intake but has not noticed any weight loss since the last visit.  She denies any aggression, violence, mania.  She denies any exercise and does not go for walking which was recommended in the past.  She uses CPAP.  She is not interested in therapy.  Past Psychiatric History: H/O multiple inpatient. Last admissions in May 2020 at [facility]. H/O suicidal attempt by taking overdose.  Diagnosed with PTSD, depression, bipolar  disorder.  Tried Paxil, Prozac, Wellbutrin, Effexor, Lexapro, amitriptyline, Cymbalta, Abilify (Tremors), Neurontin, trazodone, Thorazine, hydroxyzine, Luvox, Sinequan, Zyprexa, Ambien and Latuda.  Best responded on Risperdal and Cymbalta.      Outpatient Encounter Medications as of 08/08/2023  Medication Sig   apixaban (ELIQUIS) 5 MG TABS tablet Take 5 mg by mouth 2 (two) times daily.   atorvastatin (LIPITOR) 20 MG tablet TAKE ONE TABLET AT BEDTIME   BREZTRI AEROSPHERE 160-9-4.8 MCG/ACT AERO daily. Per patient taking 1 puff daily   cetirizine (ZYRTEC) 10 MG tablet TAKE 1 TABLET ONCE DAILY (Patient not taking: Reported on 07/02/2023)   divalproex (DEPAKOTE) 500 MG DR tablet Take 2 tablets (1,000 mg total) by mouth at bedtime.   DULoxetine (CYMBALTA) 60 MG capsule Take 1 capsule (60 mg total) by mouth in the morning.   furosemide (LASIX) 20 MG tablet Take 20 mg by mouth as needed. Takes if gaines 3 pounds   levothyroxine (SYNTHROID) 125 MCG tablet TAKE 1 TABLET DAILY   linaclotide (LINZESS) 145 MCG CAPS capsule Take 1 capsule (145 mcg total) by mouth daily before breakfast.   metoprolol succinate (TOPROL-XL) 25 MG 24 hr tablet Take 1 tablet by mouth daily.   omeprazole (PRILOSEC) 20 MG capsule Take 40 mg by mouth daily.   risperidone (RISPERDAL) 3 MG tablet Take 2 tablets (6 mg total) by mouth at bedtime.   traZODone (DESYREL) 100 MG tablet Take 1 tablet (100 mg total) by mouth at bedtime.   No facility-administered encounter medications on file as of 08/08/2023.    Recent Results (from  the past 2160 hours)  Valproic Acid level     Status: None   Collection Time: 07/02/23 11:46 AM  Result Value Ref Range   Valproic Acid Lvl 65.1 50.0 - 100.0 mg/L  TSH     Status: None   Collection Time: 07/02/23 11:46 AM  Result Value Ref Range   TSH 2.91 0.35 - 5.50 uIU/mL  Lipid panel     Status: Abnormal   Collection Time: 07/02/23 11:46 AM  Result Value Ref Range   Cholesterol 171 0 - 200 mg/dL     Comment: ATP III Classification       Desirable:  < 200 mg/dL               Borderline High:  200 - 239 mg/dL          High:  > = 161 mg/dL   Triglycerides 096.0 (H) 0.0 - 149.0 mg/dL    Comment: Normal:  <454 mg/dLBorderline High:  150 - 199 mg/dL   HDL 09.81 >19.14 mg/dL   VLDL 78.2 0.0 - 95.6 mg/dL   LDL Cholesterol 92 0 - 99 mg/dL   Total CHOL/HDL Ratio 4     Comment:                Men          Women1/2 Average Risk     3.4          3.3Average Risk          5.0          4.42X Average Risk          9.6          7.13X Average Risk          15.0          11.0                       NonHDL 125.29     Comment: NOTE:  Non-HDL goal should be 30 mg/dL higher than patient's LDL goal (i.e. LDL goal of < 70 mg/dL, would have non-HDL goal of < 100 mg/dL)  Hemoglobin O1H     Status: None   Collection Time: 07/02/23 11:46 AM  Result Value Ref Range   Hgb A1c MFr Bld 5.5 4.6 - 6.5 %    Comment: Glycemic Control Guidelines for People with Diabetes:Non Diabetic:  <6%Goal of Therapy: <7%Additional Action Suggested:  >8%   CBC with Differential/Platelet     Status: None   Collection Time: 07/02/23 11:46 AM  Result Value Ref Range   WBC 6.7 4.0 - 10.5 K/uL   RBC 4.60 3.87 - 5.11 Mil/uL   Hemoglobin 14.4 12.0 - 15.0 g/dL   HCT 08.6 57.8 - 46.9 %   MCV 93.6 78.0 - 100.0 fl   MCHC 33.4 30.0 - 36.0 g/dL   RDW 62.9 52.8 - 41.3 %   Platelets 162.0 150.0 - 400.0 K/uL   Neutrophils Relative % 61.6 43.0 - 77.0 %   Lymphocytes Relative 27.2 12.0 - 46.0 %   Monocytes Relative 8.9 3.0 - 12.0 %   Eosinophils Relative 1.7 0.0 - 5.0 %   Basophils Relative 0.6 0.0 - 3.0 %   Neutro Abs 4.2 1.4 - 7.7 K/uL   Lymphs Abs 1.8 0.7 - 4.0 K/uL   Monocytes Absolute 0.6 0.1 - 1.0 K/uL   Eosinophils Absolute 0.1 0.0 - 0.7 K/uL   Basophils Absolute 0.0 0.0 - 0.1  K/uL  Comprehensive metabolic panel     Status: None   Collection Time: 07/02/23 11:46 AM  Result Value Ref Range   Sodium 139 135 - 145 mEq/L   Potassium 4.4  3.5 - 5.1 mEq/L   Chloride 103 96 - 112 mEq/L   CO2 30 19 - 32 mEq/L   Glucose, Bld 92 70 - 99 mg/dL   BUN 14 6 - 23 mg/dL   Creatinine, Ser 1.19 0.40 - 1.20 mg/dL   Total Bilirubin 0.5 0.2 - 1.2 mg/dL   Alkaline Phosphatase 66 39 - 117 U/L   AST 17 0 - 37 U/L   ALT 10 0 - 35 U/L   Total Protein 6.8 6.0 - 8.3 g/dL   Albumin 4.2 3.5 - 5.2 g/dL   GFR 14.78 >29.56 mL/min    Comment: Calculated using the CKD-EPI Creatinine Equation (2021)   Calcium 9.5 8.4 - 10.5 mg/dL     Psychiatric Specialty Exam: Physical Exam  Review of Systems  Musculoskeletal:  Positive for back pain.    Weight (!) 346 lb (156.9 kg), last menstrual period 02/18/1995.There is no height or weight on file to calculate BMI.  General Appearance: NA  Eye Contact:  NA  Speech:  Slow  Volume:  Decreased  Mood:  Anxious and Dysphoric  Affect:  NA  Thought Process:  Descriptions of Associations: Intact  Orientation:  Full (Time, Place, and Person)  Thought Content:  Rumination  Suicidal Thoughts:  No  Homicidal Thoughts:  No  Memory:  Immediate;   Fair Recent;   Fair Remote;   Fair  Judgement:  Fair  Insight:  Shallow  Psychomotor Activity:  Decreased  Concentration:  Concentration: Fair and Attention Span: Fair  Recall:  [employer] of Knowledge:  Good  Language:  Good  Akathisia:  No  Handed:  Right  AIMS (if indicated):     Assets:  Communication Skills Desire for Improvement Housing Social Support  ADL's:  Intact  Cognition:  WNL  Sleep: 4 to 5 hours.  Uses CPAP     Assessment/Plan: Patient is 68 year old female with multiple health issues more noticeably obesity, coronary artery disease, chronic pain, hypertension, sleep apnea hypothyroidism.  She noticed increased risperidone had help some of her hallucination but is still struggling with poor sleep and self care.  She has taken only 1 time shower when she went outside with her sister.  I encourage see the primary care for her chronic health  issues and address her weight.  Recommend to try Cymbalta 90 mg to help her chronic pain and help with anxiety and depression.  Patient agreed to give a try.  She is not interested in therapy.  We will continue Depakote 1000 mg at bedtime, Risperdal 6 mg and trazodone 100 mg.  In the past she had tried higher dose of trazodone but it did not help.  We will add 30 mg Cymbalta to total dose of 90 mg.  I encouraged to have a follow-up visit in 2 months in person.  Encouraged to call us back if she is any question or any concern.  Mixed bipolar I disorder (HCC) - Plan: divalproex (DEPAKOTE) 500 MG DR tablet, DULoxetine (CYMBALTA) 60 MG capsule, risperiDONE (RISPERDAL) 3 MG tablet, traZODone (DESYREL) 100 MG tablet  Insomnia due to mental disorder - Plan: divalproex (DEPAKOTE) 500 MG DR tablet, traZODone (DESYREL) 100 MG tablet  Generalized anxiety disorder - Plan: DULoxetine (CYMBALTA) 60 MG capsule, traZODone (DESYREL) 100 MG tablet, DULoxetine (  CYMBALTA) 20 MG capsule    Follow Up Instructions:     I discussed the assessment and treatment plan with the patient. The patient was provided an opportunity to ask questions and all were answered. The patient agreed with the plan and demonstrated an understanding of the instructions.   The patient was advised to call back or seek an in-person evaluation if the symptoms worsen or if the condition fails to improve as anticipated.    Collaboration of Care: Other provider involved in patient's care AEB notes are available in epic to review  Patient/Guardian was advised Release of Information must be obtained prior to any record release in order to collaborate their care with an outside provider. Patient/Guardian was advised if they have not already done so to contact the registration department to sign all necessary forms in order for [name] to release information regarding their care.   Consent: Patient/Guardian gives verbal consent for treatment and  assignment of benefits for services provided during this visit. Patient/Guardian expressed understanding and agreed to proceed.     I provided 27 minutes of non face to face time during this encounter.  Note: This document was prepared by [employer] voice dictation technology and any errors that results from this process are unintentional.    Cleotis Nipper, MD 08/08/2023

## 2023-08-10 DIAGNOSIS — R300 Dysuria: Secondary | ICD-10-CM | POA: Diagnosis not present

## 2023-08-10 DIAGNOSIS — K5909 Other constipation: Secondary | ICD-10-CM | POA: Diagnosis not present

## 2023-08-10 DIAGNOSIS — R350 Frequency of micturition: Secondary | ICD-10-CM | POA: Diagnosis not present

## 2023-08-29 ENCOUNTER — Ambulatory Visit: Payer: Self-pay

## 2023-08-29 ENCOUNTER — Ambulatory Visit: Admitting: Family Medicine

## 2023-08-29 NOTE — Telephone Encounter (Signed)
 Received secure chat message from clinic Allison Sharp that patient will need to come in person for appointment due to needing a urine sample. Attempted to contact Allison Sharp via cell phone she did not answer and mailbox is full. Called home line and spoke with Allison Sharp, explained she will need to provide another urine sample, she is agreeable to in person follow up to discuss stones in urine and care planning. Scheduled in person visit on Monday, September 03 2023. Reviewed reason to seek evaluation at urgent care. Discussed reasons to call back office and to call back with any new questions.

## 2023-08-29 NOTE — Telephone Encounter (Signed)
  Chief Complaint: Continued urinary symptoms Symptoms: frequency, pelvic pain Frequency: constant Pertinent Negatives: Patient denies fever, new symptoms Disposition: [] ED /[] Urgent Care (no appt availability in office) / [x] Appointment(In office/virtual)/ []  [place] [facility]/ [] Home Care/ [] Refused Recommended Disposition /[] [facility]/ []  Follow-up with PCP Additional Notes:  Approximately 08/03/23 she developed urinary infection symptoms. She was evaluated at urgent care on 08/10/23, and prescribed antibiotics X 7 days, which she finished on Monday 08/27/23. Allison Sharp states they said she had blood in her urine and stones in her kidney. Calling today to request follow up as she is still experiencing urinary frequency and pelvic pain. No new symptoms. Virtual acute office visit scheduled today, patient declined in office visit as she is unavailable to come into the office until Monday. Educated on care advice as documented in protocol, patient verbalized understanding.    Copied from CRM [id number]. Topic: Clinical - Red Word Triage >> Aug 29, 2023 10:34 AM Allison Sharp wrote: [facility] that prompted transfer to Nurse Triage: Patient has burning and some pain from UTI-went to see Dr. Terence Lux but pt claims she was not prescribed any medication therefore had to go to Urgent Care but still not feeling better Reason for Disposition  Urinating more frequently than usual (i.e., frequency)  Protocols used: Urinary Symptoms-Sharp-[facility]

## 2023-09-03 ENCOUNTER — Ambulatory Visit: Admitting: Family Medicine

## 2023-09-04 NOTE — Progress Notes (Deleted)
   Acute Office Visit  Subjective:     Patient ID: Allison Sharp, female    DOB: 30-May-1955, 68 y.o.   MRN: [id number]  No chief complaint on file.   HPI Patient is in today for ***  ROS Per HPI      Objective:    LMP 02/18/1995    Physical Exam Vitals and nursing note reviewed.  Constitutional:      General: She is not in acute distress.    Appearance: Normal appearance. She is normal weight.  HENT:     Head: Normocephalic and atraumatic.     Right Ear: External ear normal.     Left Ear: External ear normal.     Nose: Nose normal.     Mouth/Throat:     Mouth: Mucous membranes are moist.     Pharynx: Oropharynx is clear.  Eyes:     Extraocular Movements: Extraocular movements intact.     Pupils: Pupils are equal, round, and reactive to light.  Cardiovascular:     Rate and Rhythm: Normal rate and regular rhythm.     Pulses: Normal pulses.     Heart sounds: Normal heart sounds.  Pulmonary:     Effort: Pulmonary effort is normal. No respiratory distress.     Breath sounds: Normal breath sounds. No wheezing, rhonchi or rales.  Musculoskeletal:        General: Normal range of motion.     Cervical back: Normal range of motion.     Right lower leg: No edema.     Left lower leg: No edema.  Lymphadenopathy:     Cervical: No cervical adenopathy.  Neurological:     General: No focal deficit present.     Mental Status: She is alert and oriented to person, place, and time.  Psychiatric:        Mood and Affect: Mood normal.        Thought Content: Thought content normal.   No results found for any visits on 09/05/23.      Assessment & Plan:   There are no diagnoses linked to this encounter.   No orders of the defined types were placed in this encounter.   No follow-ups on file.  Sherald Barge, FNP

## 2023-09-05 ENCOUNTER — Ambulatory Visit: Admitting: Family Medicine

## 2023-09-05 DIAGNOSIS — R300 Dysuria: Secondary | ICD-10-CM

## 2023-09-12 ENCOUNTER — Ambulatory Visit: Admitting: Emergency Medicine

## 2023-09-12 ENCOUNTER — Encounter: Payer: Self-pay | Admitting: Emergency Medicine

## 2023-09-12 VITALS — BP 134/80 | HR 85 | Temp 97.9°F | Resp 18 | Ht 69.0 in | Wt 357.0 lb

## 2023-09-12 DIAGNOSIS — F315 Bipolar disorder, current episode depressed, severe, with psychotic features: Secondary | ICD-10-CM

## 2023-09-12 DIAGNOSIS — I890 Lymphedema, not elsewhere classified: Secondary | ICD-10-CM | POA: Diagnosis not present

## 2023-09-12 DIAGNOSIS — Z87440 Personal history of urinary (tract) infections: Secondary | ICD-10-CM | POA: Diagnosis not present

## 2023-09-12 DIAGNOSIS — Z8673 Personal history of transient ischemic attack (TIA), and cerebral infarction without residual deficits: Secondary | ICD-10-CM

## 2023-09-12 DIAGNOSIS — E039 Hypothyroidism, unspecified: Secondary | ICD-10-CM | POA: Diagnosis not present

## 2023-09-12 DIAGNOSIS — J438 Other emphysema: Secondary | ICD-10-CM | POA: Diagnosis not present

## 2023-09-12 DIAGNOSIS — I25118 Atherosclerotic heart disease of native coronary artery with other forms of angina pectoris: Secondary | ICD-10-CM

## 2023-09-12 DIAGNOSIS — I10 Essential (primary) hypertension: Secondary | ICD-10-CM | POA: Diagnosis not present

## 2023-09-12 DIAGNOSIS — I5032 Chronic diastolic (congestive) heart failure: Secondary | ICD-10-CM

## 2023-09-12 DIAGNOSIS — E785 Hyperlipidemia, unspecified: Secondary | ICD-10-CM

## 2023-09-12 NOTE — Assessment & Plan Note (Signed)
 Chronic and stable.

## 2023-09-12 NOTE — Assessment & Plan Note (Signed)
 Asymptomatic at present time. No available recent urine culture We will repeat urinalysis and urine culture today.

## 2023-09-12 NOTE — Patient Instructions (Signed)
 Health Maintenance After Age 70 After age 4, you are at a higher risk for certain long-term diseases and infections as well as injuries from falls. Falls are a major cause of broken bones and head injuries in people who are older than age 47. Getting regular preventive care can help to keep you healthy and well. Preventive care includes getting regular testing and making lifestyle changes as recommended by your health care provider. Talk with your health care provider about: Which screenings and tests you should have. A screening is a test that checks for a disease when you have no symptoms. A diet and exercise plan that is right for you. What should I know about screenings and tests to prevent falls? Screening and testing are the best ways to find a health problem early. Early diagnosis and treatment give you the best chance of managing medical conditions that are common after age 37. Certain conditions and lifestyle choices may make you more likely to have a fall. Your health care provider may recommend: Regular vision checks. Poor vision and conditions such as cataracts can make you more likely to have a fall. If you wear glasses, make sure to get your prescription updated if your vision changes. Medicine review. Work with your health care provider to regularly review all of the medicines you are taking, including over-the-counter medicines. Ask your health care provider about any side effects that may make you more likely to have a fall. Tell your health care provider if any medicines that you take make you feel dizzy or sleepy. Strength and balance checks. Your health care provider may recommend certain tests to check your strength and balance while standing, walking, or changing positions. Foot health exam. Foot pain and numbness, as well as not wearing proper footwear, can make you more likely to have a fall. Screenings, including: Osteoporosis screening. Osteoporosis is a condition that causes  the bones to get weaker and break more easily. Blood pressure screening. Blood pressure changes and medicines to control blood pressure can make you feel dizzy. Depression screening. You may be more likely to have a fall if you have a fear of falling, feel depressed, or feel unable to do activities that you used to do. Alcohol use screening. Using too much alcohol can affect your balance and may make you more likely to have a fall. Follow these instructions at home: Lifestyle Do not drink alcohol if: Your health care provider tells you not to drink. If you drink alcohol: Limit how much you have to: 0-1 drink a day for women. 0-2 drinks a day for men. Know how much alcohol is in your drink. In the [place] one drink equals one 12 oz bottle of beer (355 mL), one 5 oz glass of wine (148 mL), or one 1 oz glass of hard liquor (44 mL). Do not use any products that contain nicotine or tobacco. These products include cigarettes, chewing tobacco, and vaping devices, such as e-cigarettes. If you need help quitting, ask your health care provider. Activity  Follow a regular exercise program to stay fit. This will help you maintain your balance. Ask your health care provider what types of exercise are appropriate for you. If you need a cane or walker, use it as recommended by your health care provider. Wear supportive shoes that have nonskid soles. Safety  Remove any tripping hazards, such as rugs, cords, and clutter. Install safety equipment such as grab bars in bathrooms and safety rails on stairs. Keep rooms and walkways  well-lit. General instructions Talk with your health care provider about your risks for falling. Tell your health care provider if: You fall. Be sure to tell your health care provider about all falls, even ones that seem minor. You feel dizzy, tiredness (fatigue), or off-balance. Take over-the-counter and prescription medicines only as told by your health care provider. These include  supplements. Eat a healthy diet and maintain a healthy weight. A healthy diet includes low-fat dairy products, low-fat (lean) meats, and fiber from whole grains, beans, and lots of fruits and vegetables. Stay current with your vaccines. Schedule regular health, dental, and eye exams. Summary Having a healthy lifestyle and getting preventive care can help to protect your health and wellness after age 11. Screening and testing are the best way to find a health problem early and help you avoid having a fall. Early diagnosis and treatment give you the best chance for managing medical conditions that are more common for people who are older than age 28. Falls are a major cause of broken bones and head injuries in people who are older than age 48. Take precautions to prevent a fall at home. Work with your health care provider to learn what changes you can make to improve your health and wellness and to prevent falls. This information is not intended to replace advice given to you by your health care provider. Make sure you discuss any questions you have with your health care provider. Document Revised: 10/04/2020 Document Reviewed: 10/04/2020 [employer] Patient Education  2024 [employer].

## 2023-09-12 NOTE — Assessment & Plan Note (Signed)
Stable.  Continue daily Trelegy. 

## 2023-09-12 NOTE — Assessment & Plan Note (Signed)
 Diet and nutrition discussed.  Advised to decrease amount of daily carbohydrate intake and daily calories and increase amount of plant-based protein in her diet.

## 2023-09-12 NOTE — Progress Notes (Signed)
 Allison Sharp 68 y.o.   Chief Complaint  Patient presents with   Follow-up    Previous UTI. Patient stated "No longer having burning or urinary frequency"    HISTORY OF PRESENT ILLNESS: This is a 68 y.o. female here for follow-up of recent urinary tract infection Asymptomatic at present time Has history of chronic palpitations and occasional chest pain.  Has seen cardiologist in the past.  Requesting referral again.   HPI   Prior to Admission medications   Medication Sig Start Date End Date Taking? Authorizing Provider  apixaban (ELIQUIS) 5 MG TABS tablet Take 5 mg by mouth 2 (two) times daily.   Yes [provider]  atorvastatin (LIPITOR) 20 MG tablet TAKE ONE TABLET AT BEDTIME 04/29/22  Yes Sadi Arave, Isidro Margo, MD  BREZTRI AEROSPHERE 160-9-4.8 MCG/ACT AERO daily. Per patient taking 1 puff daily   Yes [provider]  cetirizine (ZYRTEC) 10 MG tablet TAKE 1 TABLET ONCE DAILY 12/17/20  Yes Dettinger, Joshua A, MD  divalproex (DEPAKOTE) 500 MG DR tablet Take 2 tablets (1,000 mg total) by mouth at bedtime. 08/08/23  Yes Arfeen, Bronson Canny, MD  DULoxetine (CYMBALTA) 20 MG capsule Take 1 capsule (20 mg total) by mouth daily. 08/08/23 10/07/23 Yes Arfeen, Bronson Canny, MD  DULoxetine (CYMBALTA) 60 MG capsule Take 1 capsule (60 mg total) by mouth in the morning. 08/08/23  Yes Arfeen, Bronson Canny, MD  furosemide (LASIX) 20 MG tablet Take 20 mg by mouth as needed. Takes if gaines 3 pounds 07/02/23  Yes Xana Bradt Jose, MD  levothyroxine (SYNTHROID) 125 MCG tablet TAKE 1 TABLET DAILY 03/11/21  Yes Dettinger, Lucio Sabin, MD  linaclotide [facility]) 145 MCG CAPS capsule Take 1 capsule (145 mcg total) by mouth daily before breakfast. 07/02/23  Yes Salvador Bigbee, Isidro Margo, MD  metoprolol succinate (TOPROL-XL) 25 MG 24 hr tablet Take 1 tablet by mouth daily. 01/25/21  Yes [provider]  omeprazole (PRILOSEC) 20 MG capsule Take 40 mg by mouth daily. 04/30/23  Yes [provider]   risperiDONE (RISPERDAL) 3 MG tablet Take 2 tablets (6 mg total) by mouth at bedtime. 08/08/23 10/07/23 Yes Arfeen, Bronson Canny, MD  traZODone (DESYREL) 100 MG tablet Take 1 tablet (100 mg total) by mouth at bedtime. 08/08/23  Yes Arfeen, Bronson Canny, MD    Allergies  Allergen Reactions   Abilify [Aripiprazole] Other (See Comments)    hallucinations   Naproxen Nausea And Vomiting and Swelling   Ativan [Lorazepam] Other (See Comments)    Delirium   Haldol [Haloperidol] Other (See Comments)    Hallucinating    Hydroxyzine Other (See Comments)    hallucinations    Patient Active Problem List   Diagnosis Date Noted   History of stroke 01/19/2022   Chronic idiopathic thrombocytopenia (HCC) 12/17/2020   Diastolic congestive heart failure (HCC) 12/17/2020   Chronic acquired lymphedema 02/07/2019   Allergies 10/17/2018   Memory loss 09/16/2018   Prolonged QT interval 08/06/2018   Chronic obstructive pulmonary disease (HCC) 05/06/2018   Fatty liver 10/24/2017   Splenic varices 10/22/2017   Thrombocytopenia (HCC) 09/29/2017   Visual hallucinations    Sacroiliac pain 11/13/2016   Bipolar I disorder, most recent episode depressed, severe w psychosis (HCC) 06/21/2016   History of MI (myocardial infarction) 06/21/2016   Hypertension 06/21/2016   Spinal stenosis of lumbar region with neurogenic claudication 03/29/2016   Severe obesity (BMI >= 40) (HCC) 07/05/2015   Dyslipidemia 07/05/2015   MDD (major depressive disorder), single episode, severe  with psychotic features (HCC) 01/21/2015   Bilateral knee pain 01/07/2014   Scar condition and fibrosis of skin 12/11/2013   Essential hypertension, benign 07/01/2013   OSA on CPAP 11/04/2012   Back pain, chronic 08/27/2012   Vitamin D insufficiency 04/04/2012   Insomnia due to mental disorder 04/04/2012   Anxiety 04/04/2012   OCD (obsessive compulsive disorder) 04/04/2012   PTSD (post-traumatic stress disorder) 06/28/2011   Coronary artery disease  08/26/2010   GERD 03/01/2009   Morbid obesity (HCC) 02/15/2009   Hypothyroidism 02/12/2009    Past Medical History:  Diagnosis Date   Allergy    Anxiety    Arthritis    Asthma    Bipolar 1 disorder (HCC)    CAD (coronary artery disease)    Cellulitis    CHF (congestive heart failure) (HCC)    diastolic dysfunction   Chronic back pain    Chronic headaches    Complication of anesthesia    States she typically gets sick s/p anesthesia   Contusion of sacrum    COPD (chronic obstructive pulmonary disease) (HCC)    Dementia (HCC)    Depression    Dyspnea    PFT 03/05/09 FEV1 2.77(98%), FVC 3.25(86%), FEV1% 85, TLC 5.88(99%), DLCO 60% ,  Methacholine challenge 03/16/09 normal ,  CT chest 03/12/09 no pulmonary disease   Fungal infection    GERD (gastroesophageal reflux disease)    History of colonoscopy 10/17/2002   by Dr Homero Luster distal non-specific proctitis, small ext hemorrhoids,    HTN (hypertension)    Hyperlipidemia    Hyperthyroidism    radioactive - thyroid cancer    Migraine headache    Morbid obesity with body mass index of 50.0-59.9 in adult (HCC) JAN 2011 370 LBS   2004 311 BMI 45.9   Myocardial infarction (HCC)    NOV 1997   OSA on CPAP    she had been on 2L O2 at night but that was stopped   PONV (postoperative nausea and vomiting)    PTSD (post-traumatic stress disorder)    PTSD (post-traumatic stress disorder)    Suicidal ideation    Urine incontinence    Vitamin D deficiency     Past Surgical History:  Procedure Laterality Date   ABDOMINAL HYSTERECTOMY     sept 1996   APPENDECTOMY     BACK SURGERY  2008   CARDIAC CATHETERIZATION     nov 1997   CHOLECYSTECTOMY     COLONOSCOPY  10/17/2002    Distal proctitis, small external hemorrhoids, otherwise/  normal colonoscopy. Suspect rectal bleeding secondary to hemorrhoids   ESOPHAGOGASTRODUODENOSCOPY  03/18/09   fundic gland polyps/mild gastritis   HERNIA REPAIR  1978   JOINT REPLACEMENT     bil knee  replacement   KNEE ARTHROSCOPY     LEFT HEART CATH AND CORONARY ANGIOGRAPHY N/A 08/08/2018   Procedure: LEFT HEART CATH AND CORONARY ANGIOGRAPHY;  Surgeon: Lucendia Rusk, MD;  Location: [facility] INVASIVE CV LAB;  Service: Cardiovascular;  Laterality: N/A;   MULTIPLE EXTRACTIONS WITH ALVEOLOPLASTY N/A 08/16/2015   Procedure: EXTRACTION OF TEETH THREE, SIX, EIGHT, NINE, ELEVEN, FOURTEEN, FIFTEEN, TWENTY-EIGHT WITH ALVEOLOPLASTY;  Surgeon: Ascencion Lava, DDS;  Location: [facility] OR;  Service: Oral Surgery;  Laterality: N/A;   ROTATOR CUFF REPAIR Right    TONSILLECTOMY     TOTAL VAGINAL HYSTERECTOMY     TUBAL LIGATION      Social History   Socioeconomic History   Marital status: Single    Spouse name: Not  on file   Number of children: 2   Years of education: Not on file   Highest education level: Not on file  Occupational History   Occupation: Disabled    Employer: UNEMPLOYED    Comment: back problems  Tobacco Use   Smoking status: Never   Smokeless tobacco: Never  Vaping Use   Vaping status: Never Used  Substance and Sexual Activity   Alcohol use: No    Alcohol/week: 0.0 standard drinks of alcohol   Drug use: No   Sexual activity: Never    Birth control/protection: Abstinence  Other Topics Concern   Not on file  Social History Narrative   Ms.Viele  is disabled and lives with her sister.    She has two grown sons that she does not see often, as they live in other states. She has 5 grand children.   She has a long history of mental illness including depression, PTSD, suicidal and homicidal ideation.   She has been obese most all of her life. Her weight has significantly impacted her QOL. She recently lost 20 lbs. By decreasing portion size & increasing proteins.    Social Drivers of Health   Financial Resource Strain: Medium Risk (12/15/2020)   Received from [facility], [facility]   Overall Financial Resource Strain (CARDIA)    Difficulty of Paying Living Expenses:  Somewhat hard  Food Insecurity: Food Insecurity Present (12/15/2020)   Received [facility], [facility]   Hunger Vital Sign    Worried About Running Out of Food in the Last Year: Sometimes true    Ran Out of Food in the Last Year: Sometimes true  Transportation Needs: No Transportation Needs (12/15/2020)   Received from [facility], [facility]   [facility] - Transportation    Lack of Transportation (Medical): No    Lack of Transportation (Non-Medical): No  Physical Activity: Not on file  Stress: Not on file  Social Connections: Unknown (10/08/2021)   Received from [facility], [facility]   Social Network    Social Network: Not on file  Intimate Partner Violence: Unknown (08/31/2021)   Received from [facility], [facility]   HITS    Physically Hurt: Not on file    Insult or Talk Down To: Not on file    Threaten Physical Harm: Not on file    Scream or Curse: Not on file    Family History  Adopted: Yes  Problem Relation Age of Onset   Hypertension Mother    Bipolar disorder Mother    Dementia Mother    Depression Mother    AAA (abdominal aortic aneurysm) Mother    Coronary artery disease Father    Alcohol abuse Father    Hypertension Brother    Coronary artery disease Brother    Bipolar disorder Brother    Depression Brother    Depression Sister    Paranoid behavior Sister    Cancer Sister        breast   Bipolar disorder Sister    Depression Sister    Hypertension Sister    Arthritis Sister        knee and hand    Irregular heart beat Sister        takes eliquis    Cancer Son        thyroid   Drug abuse Son    Alcohol abuse Son    Cancer Maternal Grandfather  throat    Cancer Maternal Aunt        breast metastatized to brain   Anesthesia problems Neg Hx    Hypotension Neg Hx    Malignant hyperthermia Neg Hx    Pseudochol deficiency Neg Hx      Review of Systems  Constitutional:  Negative for chills and fever.  HENT:  Negative.  Negative for congestion and sore throat.   Respiratory: Negative.  Negative for cough and shortness of breath.   Cardiovascular: Negative.  Negative for chest pain.  Gastrointestinal:  Negative for abdominal pain, diarrhea, nausea and vomiting.  Genitourinary: Negative.  Negative for dysuria, frequency and hematuria.  Skin: Negative.  Negative for rash.  Neurological: Negative.  Negative for dizziness and headaches.  All other systems reviewed and are negative.   Vitals:   09/12/23 1507  BP: 134/80  Pulse: 85  Resp: 18  Temp: 97.9 F (36.6 C)  SpO2: 97%    Physical Exam Vitals reviewed.  Constitutional:      Appearance: Normal appearance.  HENT:     Head: Normocephalic.     Mouth/Throat:     Mouth: Mucous membranes are moist.     Pharynx: Oropharynx is clear.  Eyes:     Extraocular Movements: Extraocular movements intact.     Pupils: Pupils are equal, round, and reactive to light.  Cardiovascular:     Rate and Rhythm: Normal rate and regular rhythm.     Pulses: Normal pulses.     Heart sounds: Normal heart sounds.  Pulmonary:     Effort: Pulmonary effort is normal.     Breath sounds: Normal breath sounds.  Abdominal:     Palpations: Abdomen is soft.     Tenderness: There is no abdominal tenderness.  Musculoskeletal:     Cervical back: No tenderness.  Lymphadenopathy:     Cervical: No cervical adenopathy.  Skin:    General: Skin is warm and dry.     Capillary Refill: Capillary refill takes less than 2 seconds.     Comments: Chronic lymphedema lower extremities  Neurological:     General: No focal deficit present.     Mental Status: She is alert and oriented to person, place, and time.  Psychiatric:        Mood and Affect: Mood normal.        Behavior: Behavior normal.      ASSESSMENT & PLAN: A total of 44 minutes was spent with the patient and counseling/coordination of care regarding preparing for this visit, review of most recent office visit  notes, review of multiple chronic medical conditions and their management, review of all medications, review of most recent bloodwork results, review of health maintenance items, education on nutrition, prognosis, documentation, and need for follow up.   Problem List Items Addressed This Visit       Cardiovascular and Mediastinum   Essential hypertension, benign (Chronic)   BP Readings from Last 3 Encounters:  09/12/23 134/80  07/02/23 (!) 134/94  06/12/23 138/86   Well-controlled hypertension Continue metoprolol succinate 25 mg daily and Lasix 20 mg daily       Relevant Orders   Ambulatory referral to Cardiology   Coronary artery disease   Stable.  No recent anginal episodes. Current continue metoprolol succinate 25 mg daily. Sees cardiologist on a regular basis.      Relevant Orders   Ambulatory referral to Cardiology   Diastolic congestive heart failure (HCC)   No signs or symptoms of acute  CHF. Continue Lasix 20 mg daily.      Relevant Orders   Ambulatory referral to Cardiology     Respiratory   Chronic obstructive pulmonary disease (HCC)   Stable.  Continue daily Trelegy.        Endocrine   Hypothyroidism (Chronic)   Clinically euthyroid. Continue Synthroid 125 mcg daily.        Other   Morbid obesity (HCC) (Chronic)   Diet and nutrition discussed. Advised to decrease amount of daily carbohydrate intake and daily calories and increase amount of plant based protein in her diet.      Dyslipidemia   Stable chronic condition Continue atorvastatin 20 mg daily      Bipolar I disorder, most recent episode depressed, severe w psychosis (HCC)   Stable.  Sees psychiatrist on a regular basis. Medications handled by them.      Chronic acquired lymphedema   Chronic and stable.      History of stroke   Stable.  Continue Eliquis 5 mg daily. Secondary stroke prevention measures discussed      Recent urinary tract infection - Primary   Asymptomatic at  present time. No available recent urine culture We will repeat urinalysis and urine culture today.      Relevant Orders   Urinalysis   Urine Culture   Patient Instructions  Health Maintenance After Age 55 After age 70, you are at a higher risk for certain long-term diseases and infections as well as injuries from falls. Falls are a major cause of broken bones and head injuries in people who are older than age 61. Getting regular preventive care can help to keep you healthy and well. Preventive care includes getting regular testing and making lifestyle changes as recommended by your health care provider. Talk with your health care provider about: Which screenings and tests you should have. A screening is a test that checks for a disease when you have no symptoms. A diet and exercise plan that is right for you. What should I know about screenings and tests to prevent falls? Screening and testing are the best ways to find a health problem early. Early diagnosis and treatment give you the best chance of managing medical conditions that are common after age 52. Certain conditions and lifestyle choices may make you more likely to have a fall. Your health care provider may recommend: Regular vision checks. Poor vision and conditions such as cataracts can make you more likely to have a fall. If you wear glasses, make sure to get your prescription updated if your vision changes. Medicine review. Work with your health care provider to regularly review all of the medicines you are taking, including over-the-counter medicines. Ask your health care provider about any side effects that may make you more likely to have a fall. Tell your health care provider if any medicines that you take make you feel dizzy or sleepy. Strength and balance checks. Your health care provider may recommend certain tests to check your strength and balance while standing, walking, or changing positions. Foot health exam. Foot pain and  numbness, as well as not wearing proper footwear, can make you more likely to have a fall. Screenings, including: Osteoporosis screening. Osteoporosis is a condition that causes the bones to get weaker and break more easily. Blood pressure screening. Blood pressure changes and medicines to control blood pressure can make you feel dizzy. Depression screening. You may be more likely to have a fall if you have a fear of falling,  feel depressed, or feel unable to do activities that you used to do. Alcohol use screening. Using too much alcohol can affect your balance and may make you more likely to have a fall. Follow these instructions at home: Lifestyle Do not drink alcohol if: Your health care provider tells you not to drink. If you drink alcohol: Limit how much you have to: 0-1 drink a day for women. 0-2 drinks a day for men. Know how much alcohol is in your drink. In the [place] one drink equals one 12 oz bottle of beer (355 mL), one 5 oz glass of wine (148 mL), or one 1 oz glass of hard liquor (44 mL). Do not use any products that contain nicotine or tobacco. These products include cigarettes, chewing tobacco, and vaping devices, such as e-cigarettes. If you need help quitting, ask your health care provider. Activity  Follow a regular exercise program to stay fit. This will help you maintain your balance. Ask your health care provider what types of exercise are appropriate for you. If you need a cane or walker, use it as recommended by your health care provider. Wear supportive shoes that have nonskid soles. Safety  Remove any tripping hazards, such as rugs, cords, and clutter. Install safety equipment such as grab bars in bathrooms and safety rails on stairs. Keep rooms and walkways well-lit. General instructions Talk with your health care provider about your risks for falling. Tell your health care provider if: You fall. Be sure to tell your health care provider about all falls, even  ones that seem minor. You feel dizzy, tiredness (fatigue), or off-balance. Take over-the-counter and prescription medicines only as told by your health care provider. These include supplements. Eat a healthy diet and maintain a healthy weight. A healthy diet includes low-fat dairy products, low-fat (lean) meats, and fiber from whole grains, beans, and lots of fruits and vegetables. Stay current with your vaccines. Schedule regular health, dental, and eye exams. Summary Having a healthy lifestyle and getting preventive care can help to protect your health and wellness after age 73. Screening and testing are the best way to find a health problem early and help you avoid having a fall. Early diagnosis and treatment give you the best chance for managing medical conditions that are more common for people who are older than age 21. Falls are a major cause of broken bones and head injuries in people who are older than age 69. Take precautions to prevent a fall at home. Work with your health care provider to learn what changes you can make to improve your health and wellness and to prevent falls. This information is not intended to replace advice given to you by your health care provider. Make sure you discuss any questions you have with your health care provider. Document Revised: 10/04/2020 Document Reviewed: 10/04/2020 Elsevier Patient Education  2024 [employer]vier Inc.     Maryagnes Small, MD [facility]

## 2023-09-12 NOTE — Assessment & Plan Note (Signed)
No signs or symptoms of acute CHF. Continue Lasix 20 mg daily.

## 2023-09-12 NOTE — Assessment & Plan Note (Signed)
Stable.  Sees psychiatrist on a regular basis. Medications handled by them.

## 2023-09-12 NOTE — Assessment & Plan Note (Signed)
 Stable.  Continue Eliquis 5 mg daily. Secondary stroke prevention measures discussed

## 2023-09-12 NOTE — Assessment & Plan Note (Signed)
Stable.  No recent anginal episodes. Current continue metoprolol succinate 25 mg daily. Sees cardiologist on a regular basis.

## 2023-09-12 NOTE — Assessment & Plan Note (Signed)
Stable chronic condition. Continue atorvastatin 20 mg daily.

## 2023-09-12 NOTE — Assessment & Plan Note (Signed)
 Clinically euthyroid. Continue Synthroid 125 mcg daily.

## 2023-09-12 NOTE — Assessment & Plan Note (Signed)
 BP Readings from Last 3 Encounters:  09/12/23 134/80  07/02/23 (!) 134/94  06/12/23 138/86   Well-controlled hypertension Continue metoprolol succinate 25 mg daily and Lasix 20 mg daily

## 2023-10-04 ENCOUNTER — Encounter (HOSPITAL_COMMUNITY): Payer: Self-pay | Admitting: Psychiatry

## 2023-10-04 ENCOUNTER — Telehealth (HOSPITAL_COMMUNITY): Admitting: Psychiatry

## 2023-10-04 DIAGNOSIS — F3160 Bipolar disorder, current episode mixed, unspecified: Secondary | ICD-10-CM

## 2023-10-04 DIAGNOSIS — F5105 Insomnia due to other mental disorder: Secondary | ICD-10-CM | POA: Diagnosis not present

## 2023-10-04 DIAGNOSIS — F411 Generalized anxiety disorder: Secondary | ICD-10-CM

## 2023-10-04 MED ORDER — DIVALPROEX SODIUM 500 MG PO DR TAB: 1000.0000 mg | DELAYED_RELEASE_TABLET | Freq: Every day | ORAL | 2 refills | Status: DC

## 2023-10-04 MED ORDER — TRAZODONE HCL 100 MG PO TABS: 100.0000 mg | ORAL_TABLET | Freq: Every day | ORAL | 2 refills | Status: DC

## 2023-10-04 MED ORDER — DULOXETINE HCL 60 MG PO CPEP: 60.0000 mg | ORAL_CAPSULE | Freq: Every morning | ORAL | 2 refills | Status: DC

## 2023-10-04 MED ORDER — RISPERIDONE 3 MG PO TABS: 6.0000 mg | ORAL_TABLET | Freq: Every day | ORAL | 2 refills | Status: DC

## 2023-10-04 MED ORDER — DULOXETINE HCL 20 MG PO CPEP: 20.0000 mg | ORAL_CAPSULE | Freq: Every day | ORAL | 2 refills | Status: DC

## 2023-10-04 NOTE — Progress Notes (Signed)
 [facility] MD Virtual Progress Note   Patient Location: Home Provider Location: Office  I connect with patient by telephone and verified that I am speaking with correct person by using two identifiers. I discussed the limitations of evaluation and management by telemedicine and the availability of in person appointments. I also discussed with the patient that there may be a patient responsible charge related to this service. The patient expressed understanding and agreed to proceed.  Allison Sharp [id number] 68 y.o.  10/04/2023 11:28 AM  History of Present Illness:  Patient is evaluated via phone session.  She supposed to have in person visit but 30 minutes before her visit she called office and requested to be virtual.  She was not able to login video and preferred to be on a phone visit.  She reported things are going well otherwise.  We have increased Cymbalta  and she is taking 80 mg Cymbalta .  She is sleeping better since dose adjusted.  She denies any crying spells or any feeling of hopelessness or worthlessness.  However she does complain of chronic fatigue, tiredness.  We have tried cut down the medication in the past to help her fatigue but her symptoms come back.  She is happy with the current medication has no longer having hallucination, paranoia.  She is anxious about her 51 year old sister who is having a lot of health issues.  Patient stays with her sister Allison Sharp.  She admitted does not go outside, exercise or walk which she needs to do.  She promised that she will try to be more active.  She uses CPAP which is helping her sleep along with psychotropic medication.  She takes trazodone .  She is not interested in therapy.  She is happy that she did go to grocery stores with the sister and she is trying to be more active.  She denies drinking or using any illegal substances.  Her last Depakote  was 28 which was done earlier this year.  She is on trazodone , Depakote , respite on,  Cymbalta .  Past Psychiatric History: H/O multiple inpatient. Last admissions in May 2020 at [facility]. H/O suicidal attempt by taking overdose.  Diagnosed with PTSD, depression, bipolar disorder.  Tried Paxil, Prozac, Wellbutrin , Effexor , Lexapro, amitriptyline , Cymbalta , Abilify  (Tremors), Neurontin , trazodone , Thorazine , hydroxyzine , Luvox , Sinequan , Zyprexa , Ambien  and Latuda .  Best responded on Risperdal  and Cymbalta .        Outpatient Encounter Medications as of 10/04/2023  Medication Sig   apixaban (ELIQUIS) 5 MG TABS tablet Take 5 mg by mouth 2 (two) times daily.   atorvastatin  (LIPITOR) 20 MG tablet TAKE ONE TABLET AT BEDTIME   BREZTRI AEROSPHERE 160-9-4.8 MCG/ACT AERO daily. Per patient taking 1 puff daily   cetirizine  (ZYRTEC ) 10 MG tablet TAKE 1 TABLET ONCE DAILY   divalproex  (DEPAKOTE ) 500 MG DR tablet Take 2 tablets (1,000 mg total) by mouth at bedtime.   DULoxetine  (CYMBALTA ) 20 MG capsule Take 1 capsule (20 mg total) by mouth daily.   DULoxetine  (CYMBALTA ) 60 MG capsule Take 1 capsule (60 mg total) by mouth in the morning.   furosemide  (LASIX ) 20 MG tablet Take 20 mg by mouth as needed. Takes if gaines 3 pounds   levothyroxine  (SYNTHROID ) 125 MCG tablet TAKE 1 TABLET DAILY   linaclotide  (LINZESS ) 145 MCG CAPS capsule Take 1 capsule (145 mcg total) by mouth daily before breakfast.   metoprolol succinate (TOPROL-XL) 25 MG 24 hr tablet Take 1 tablet by mouth daily.   omeprazole  (PRILOSEC) 20 MG capsule  Take 40 mg by mouth daily.   risperiDONE  (RISPERDAL ) 3 MG tablet Take 2 tablets (6 mg total) by mouth at bedtime.   traZODone  (DESYREL ) 100 MG tablet Take 1 tablet (100 mg total) by mouth at bedtime.   No facility-administered encounter medications on file as of 10/04/2023.    No results found for this or any previous visit (from the past 2160 hours).   Psychiatric Specialty Exam: Physical Exam  Review of Systems  Constitutional:  Positive for fatigue.    Weight  (!) 367 lb (166.5 kg), last menstrual period 02/18/1995.There is no height or weight on file to calculate BMI.  General Appearance: NA  Eye Contact:  NA  Speech:  Slow  Volume:  Decreased  Mood:  Anxious and tired  Affect:  NA  Thought Process:  Goal Directed  Orientation:  Full (Time, Place, and Person)  Thought Content:  Rumination  Suicidal Thoughts:  No  Homicidal Thoughts:  No  Memory:  Immediate;   Fair Recent;   Fair Remote;   Fair  Judgement:  Fair  Insight:  Shallow  Psychomotor Activity:  Decreased  Concentration:  Concentration: Fair and Attention Span: Fair  Recall:  Good  Fund of Knowledge:  Good  Language:  Good  Akathisia:  No  Handed:  Right  AIMS (if indicated):     Assets:  Communication Skills Desire for Improvement Housing Social Support  ADL's:  Intact  Cognition:  WNL  Sleep:  uses CPAP better sleep but still waking up       10/04/2023   11:33 AM 09/12/2023    3:17 PM 07/27/2023   10:15 AM 07/02/2023   10:58 AM 03/23/2022    2:17 PM  Depression screen PHQ 2/9  Decreased Interest 1 2 2 3 3   Down, Depressed, Hopeless 1 2 2 3 3   PHQ - 2 Score 2 4 4 6 6   Altered sleeping 1 2 2 3 3   Tired, decreased energy 1 2  3 3   Change in appetite 0 2 0 0 1  Feeling bad or failure about yourself  0 2 1 3 3   Trouble concentrating 1 1 1 3 3   Moving slowly or fidgety/restless 0 0 1 0 0  Suicidal thoughts 0 0 0 0 0  PHQ-9 Score 5 13 9 18 19   Difficult doing work/chores Not difficult at all Somewhat difficult Somewhat difficult Not difficult at all Somewhat difficult    Assessment/Plan: Mixed bipolar I disorder (HCC) - Plan: DULoxetine  (CYMBALTA ) 60 MG capsule, risperiDONE  (RISPERDAL ) 3 MG tablet, traZODone  (DESYREL ) 100 MG tablet, divalproex  (DEPAKOTE ) 500 MG DR tablet  Generalized anxiety disorder - Plan: DULoxetine  (CYMBALTA ) 60 MG capsule, traZODone  (DESYREL ) 100 MG tablet, DULoxetine  (CYMBALTA ) 20 MG capsule  Insomnia due to mental disorder - Plan: traZODone   (DESYREL ) 100 MG tablet, divalproex  (DEPAKOTE ) 500 MG DR tablet  Review collateral formation.  Patient doing better since dose adjusted.  She promised that she will do in person visit.  Continue risperidone  6 mg, trazodone  100 g at bedtime, Depakote  1000 mg at bedtime, Cymbalta  80 mg.  Discussed medication side effects and benefits.  Even though patient feeling tired but does not want to change the medication as in the past we cut down the dose and her symptoms come back.  Patient not interested in therapy.  Will follow-up in 3 months in person visit.  Recommend to call us  back if she has any question or any concern.   Follow Up Instructions:  I discussed the assessment and treatment plan with the patient. The patient was provided an opportunity to ask questions and all were answered. The patient agreed with the plan and demonstrated an understanding of the instructions.   The patient was advised to call back or seek an in-person evaluation if the symptoms worsen or if the condition fails to improve as anticipated.    Collaboration of Care: Other provider involved in patient's care AEB notes are available in epic to review  Patient/Guardian was advised Release of Information must be obtained prior to any record release in order to collaborate their care with an outside provider. Patient/Guardian was advised if they have not already done so to contact the registration department to sign all necessary forms in order for us  to release information regarding their care.   Consent: Patient/Guardian gives verbal consent for treatment and assignment of benefits for services provided during this visit. Patient/Guardian expressed understanding and agreed to proceed.     Total encounter time 13 minutes which includes Sharp-to-Sharp time, chart reviewed, care coordination, order entry and documentation during this encounter.   Note: This document was prepared by [employer] voice dictation technology and any  errors that results from this process are unintentional.    Arturo Late, MD 10/04/2023

## 2023-10-09 ENCOUNTER — Ambulatory Visit (INDEPENDENT_AMBULATORY_CARE_PROVIDER_SITE_OTHER)

## 2023-10-09 VITALS — Ht 69.0 in | Wt 367.0 lb

## 2023-10-09 DIAGNOSIS — Z1231 Encounter for screening mammogram for malignant neoplasm of breast: Secondary | ICD-10-CM

## 2023-10-09 DIAGNOSIS — Z0000 Encounter for general adult medical examination without abnormal findings: Secondary | ICD-10-CM | POA: Diagnosis not present

## 2023-10-09 DIAGNOSIS — Z1211 Encounter for screening for malignant neoplasm of colon: Secondary | ICD-10-CM

## 2023-10-09 NOTE — Progress Notes (Signed)
 Subjective:   Allison Sharp is a 68 y.o. who presents for a Medicare Wellness preventive visit.  As a reminder, Annual Wellness Visits don't include a physical exam, and some assessments may be limited, especially if this visit is performed virtually. We may recommend an in-person visit if needed.  Visit Complete: Virtual I connected with  Allison Sharp on 10/09/23 by a audio enabled telemedicine application and verified that I am speaking with the correct person using two identifiers.  Patient Location: Home  Provider Location: Office/Clinic  I discussed the limitations of evaluation and management by telemedicine. The patient expressed understanding and agreed to proceed.  Vital Signs: Because this visit was a virtual/telehealth visit, some criteria may be missing or patient reported. Any vitals not documented were not able to be obtained and vitals that have been documented are patient reported.  VideoDeclined- This patient declined Librarian, academic. Therefore the visit was completed with audio only.  Persons Participating in Visit: Patient.  AWV Questionnaire: No: Patient Medicare AWV questionnaire was not completed prior to this visit.  Cardiac Risk Factors include: advanced age (>37men, >29 women);hypertension;dyslipidemia;obesity (BMI >30kg/m2)     Objective:     Today's Vitals   10/09/23 1049  Weight: (!) 367 lb (166.5 kg)  Height: 5\' 9"  (1.753 m)   Body mass index is 54.2 kg/m.     10/09/2023   10:48 AM 06/11/2020   11:33 AM 04/11/2019    4:23 PM 10/14/2018    7:36 PM 10/14/2018    7:31 PM 09/30/2018    2:48 PM 08/06/2018    5:05 PM  Advanced Directives  Does Patient Have a Medical Advance Directive? No No No  No No No  Would patient like information on creating a medical advance directive? Yes (MAU/Ambulatory/Procedural Areas - Information given) No - Patient declined No - Patient declined    No - Patient declined      Information is confidential and restricted. Go to Review Flowsheets to unlock data.    Current Medications (verified) Outpatient Encounter Medications as of 10/09/2023  Medication Sig   apixaban (ELIQUIS) 5 MG TABS tablet Take 5 mg by mouth 2 (two) times daily.   atorvastatin  (LIPITOR) 20 MG tablet TAKE ONE TABLET AT BEDTIME   BREZTRI AEROSPHERE 160-9-4.8 MCG/ACT AERO daily. Per patient taking 1 puff daily   cetirizine  (ZYRTEC ) 10 MG tablet TAKE 1 TABLET ONCE DAILY   divalproex  (DEPAKOTE ) 500 MG DR tablet Take 2 tablets (1,000 mg total) by mouth at bedtime.   DULoxetine  (CYMBALTA ) 20 MG capsule Take 1 capsule (20 mg total) by mouth daily.   DULoxetine  (CYMBALTA ) 60 MG capsule Take 1 capsule (60 mg total) by mouth in the morning.   furosemide  (LASIX ) 20 MG tablet Take 20 mg by mouth as needed. Takes if gaines 3 pounds   levothyroxine  (SYNTHROID ) 125 MCG tablet TAKE 1 TABLET DAILY   linaclotide  (LINZESS ) 145 MCG CAPS capsule Take 1 capsule (145 mcg total) by mouth daily before breakfast.   metoprolol succinate (TOPROL-XL) 25 MG 24 hr tablet Take 1 tablet by mouth daily.   omeprazole  (PRILOSEC) 20 MG capsule Take 40 mg by mouth daily.   risperiDONE  (RISPERDAL ) 3 MG tablet Take 2 tablets (6 mg total) by mouth at bedtime.   traZODone  (DESYREL ) 100 MG tablet Take 1 tablet (100 mg total) by mouth at bedtime.   No facility-administered encounter medications on file as of 10/09/2023.    Allergies (verified) Abilify  [aripiprazole ], Naproxen , Ativan [  lorazepam], Haldol [haloperidol], and Hydroxyzine    History: Past Medical History:  Diagnosis Date   Allergy    Anxiety    Arthritis    Asthma    Bipolar 1 disorder (HCC)    CAD (coronary artery disease)    Cellulitis    CHF (congestive heart failure) (HCC)    diastolic dysfunction   Chronic back pain    Chronic headaches    Complication of anesthesia    States she typically gets sick s/p anesthesia   Contusion of sacrum    COPD (chronic  obstructive pulmonary disease) (HCC)    Dementia (HCC)    Depression    Dyspnea    PFT 03/05/09 FEV1 2.77(98%), FVC 3.25(86%), FEV1% 85, TLC 5.88(99%), DLCO 60% ,  Methacholine challenge 03/16/09 normal ,  CT chest 03/12/09 no pulmonary disease   Fungal infection    GERD (gastroesophageal reflux disease)    History of colonoscopy 10/17/2002   by Dr Homero Luster distal non-specific proctitis, small ext hemorrhoids,    HTN (hypertension)    Hyperlipidemia    Hyperthyroidism    radioactive - thyroid  cancer    Migraine headache    Morbid obesity with body mass index of 50.0-59.9 in adult (HCC) JAN 2011 370 LBS   2004 311 BMI 45.9   Myocardial infarction (HCC)    NOV 1997   OSA on CPAP    she had been on 2L O2 at night but that was stopped   PONV (postoperative nausea and vomiting)    PTSD (post-traumatic stress disorder)    PTSD (post-traumatic stress disorder)    Suicidal ideation    Urine incontinence    Vitamin D  deficiency    Past Surgical History:  Procedure Laterality Date   ABDOMINAL HYSTERECTOMY     sept 1996   APPENDECTOMY     BACK SURGERY  2008   CARDIAC CATHETERIZATION     nov 1997   CHOLECYSTECTOMY     COLONOSCOPY  10/17/2002    Distal proctitis, small external hemorrhoids, otherwise/  normal colonoscopy. Suspect rectal bleeding secondary to hemorrhoids   ESOPHAGOGASTRODUODENOSCOPY  03/18/09   fundic gland polyps/mild gastritis   HERNIA REPAIR  1978   JOINT REPLACEMENT     bil knee replacement   KNEE ARTHROSCOPY     LEFT HEART CATH AND CORONARY ANGIOGRAPHY N/A 08/08/2018   Procedure: LEFT HEART CATH AND CORONARY ANGIOGRAPHY;  Surgeon: Lucendia Rusk, MD;  Location: [facility] INVASIVE CV LAB;  Service: Cardiovascular;  Laterality: N/A;   MULTIPLE EXTRACTIONS WITH ALVEOLOPLASTY N/A 08/16/2015   Procedure: EXTRACTION OF TEETH THREE, SIX, EIGHT, NINE, ELEVEN, FOURTEEN, FIFTEEN, TWENTY-EIGHT WITH ALVEOLOPLASTY;  Surgeon: Ascencion Lava, DDS;  Location: [facility] OR;  Service: Oral  Surgery;  Laterality: N/A;   ROTATOR CUFF REPAIR Right    TONSILLECTOMY     TOTAL VAGINAL HYSTERECTOMY     TUBAL LIGATION     Family History  Adopted: Yes  Problem Relation Age of Onset   Hypertension Mother    Bipolar disorder Mother    Dementia Mother    Depression Mother    AAA (abdominal aortic aneurysm) Mother    Coronary artery disease Father    Alcohol abuse Father    Hypertension Brother    Coronary artery disease Brother    Bipolar disorder Brother    Depression Brother    Depression Sister    Paranoid behavior Sister    Cancer Sister        breast   Bipolar disorder Sister  Depression Sister    Hypertension Sister    Arthritis Sister        knee and hand    Irregular heart beat Sister        takes eliquis    Cancer Son        thyroid    Drug abuse Son    Alcohol abuse Son    Cancer Maternal Grandfather        throat    Cancer Maternal Aunt        breast metastatized to brain   Anesthesia problems Neg Hx    Hypotension Neg Hx    Malignant hyperthermia Neg Hx    Pseudochol deficiency Neg Hx    Social History   Socioeconomic History   Marital status: Single    Spouse name: Not on file   Number of children: 2   Years of education: Not on file   Highest education level: Not on file  Occupational History   Occupation: Disabled    Employer: UNEMPLOYED    Comment: back problems  Tobacco Use   Smoking status: Never    Passive exposure: Never   Smokeless tobacco: Never  Vaping Use   Vaping status: Never Used  Substance and Sexual Activity   Alcohol use: Yes    Alcohol/week: 2.0 standard drinks of alcohol    Types: 1 Glasses of wine, 1 Cans of beer per week    Comment: occ   Drug use: No   Sexual activity: Never    Birth control/protection: Abstinence  Other Topics Concern   Not on file  Social History Narrative   Ms.Mosko  is disabled and lives with her sister.    She has two grown sons that she does not see often, as they live in other  states. She has 5 grand children.   She has a long history of mental illness including depression, PTSD, suicidal and homicidal ideation.   She has been obese most all of her life. Her weight has significantly impacted her QOL. She recently lost 20 lbs. By decreasing portion size & increasing proteins.    Social Drivers of Corporate investment banker Strain: Low Risk  (10/09/2023)   Overall Financial Resource Strain (CARDIA)    Difficulty of Paying Living Expenses: Not hard at all  Food Insecurity: No Food Insecurity (10/09/2023)   Hunger Vital Sign    Worried About Running Out of Food in the Last Year: Never true    Ran Out of Food in the Last Year: Never true  Transportation Needs: No Transportation Needs (10/09/2023)   PRAPARE - Administrator, Civil Service (Medical): No    Lack of Transportation (Non-Medical): No  Physical Activity: Insufficiently Active (10/09/2023)   Exercise Vital Sign    Days of Exercise per Week: 3 days    Minutes of Exercise per Session: 10 min  Stress: No Stress Concern Present (10/09/2023)   [employer] of Occupational Health - Occupational Stress Questionnaire    Feeling of Stress : Not at all  Social Connections: Socially Isolated (10/09/2023)   Social Connection and Isolation Panel [NHANES]    Frequency of Communication with Friends and Family: Once a week    Frequency of Social Gatherings with Friends and Family: Never    Attends Religious Services: Never    Database administrator or Organizations: No    Attends Banker Meetings: Never    Marital Status: Never married    Tobacco Counseling Counseling  given: No    Clinical Intake:  Pre-visit preparation completed: Yes  Pain : No/denies pain     BMI - recorded: 54.2 Nutritional Status: BMI > 30  Obese Nutritional Risks: None Diabetes: No  Lab Results  Component Value Date   HGBA1C 5.5 07/02/2023   HGBA1C 5.3 08/08/2018   HGBA1C 5.4 10/22/2017     How  often do you need to have someone help you when you read instructions, pamphlets, or other written materials from your doctor or pharmacy?: 1 - Never  Interpreter Needed?: No  Information entered by :: Kandy Orris, CMA   Activities of Daily Living     10/09/2023   10:51 AM  In your present state of health, do you have any difficulty performing the following activities:  Hearing? 0  Vision? 0  Difficulty concentrating or making decisions? 0  Walking or climbing stairs? 0  Dressing or bathing? 0  Doing errands, shopping? 0  Preparing Food and eating ? N  Using the Toilet? N  In the past six months, have you accidently leaked urine? Y  Comment wears a depend  Do you have problems with loss of bowel control? N  Managing your Medications? N  Managing your Finances? N  Housekeeping or managing your Housekeeping? N    Patient Care Team: Elvira Hammersmith, MD as PCP - General (Internal Medicine) Elmyra Haggard, MD as PCP - Cardiology (Cardiology) Arfeen, Bronson Canny, MD (Psychiatry) Alyce Jubilee, MD (Inactive) (Gastroenterology) Elmyra Haggard, MD as Consulting Physician (Cardiology) Cory Dingwall, MD (Inactive) as Consulting Physician (Infectious Diseases) Hyland Mailman, MD as Referring Physician (Optometry) Phebe Brasil, MD as Consulting Physician (Neurology)  Indicate any recent Medical Services you may have received from other than [facility] providers in the past year (date may be approximate).     Assessment:    This is a routine wellness examination for D[place].  Hearing/Vision screen Hearing Screening - Comments:: Denies hearing difficulties   Vision Screening - Comments:: Wears rx glasses - up to date with routine eye exams with Dr Eugene Hertz   Goals Addressed               This Visit's Progress     Patient Stated (pt-stated)        Patient stated that she plans to eat healthier and manage her blood pressure.         Depression Screen     10/09/2023   10:58 AM  10/04/2023   11:33 AM 09/12/2023    3:17 PM 07/27/2023   10:15 AM 07/02/2023   10:58 AM 03/23/2022    2:17 PM 01/18/2022    2:09 PM  PHQ 2/9 Scores  PHQ - 2 Score 1  4  6  1   PHQ- 9 Score 6  13  18        Information is confidential and restricted. Go to Review Flowsheets to unlock data.    Fall Risk     10/09/2023   10:52 AM 09/12/2023    3:16 PM 07/02/2023   10:57 AM 01/18/2022    2:08 PM 07/28/2020   10:54 AM  Fall Risk   Falls in the past year? 0 1 1 1 1   Number falls in past yr: 0 1 0 0 0  Comment   right knee issues is seeing otho in next month    Injury with Fall? 0 0 1 1 1   Risk for fall due to : No Fall Risks No Fall  Risks No Fall Risks History of fall(s);Impaired balance/gait History of fall(s);Impaired balance/gait  Follow up Falls prevention discussed;Falls evaluation completed Falls evaluation completed Falls evaluation completed Falls evaluation completed Falls evaluation completed    MEDICARE RISK AT HOME:  Medicare Risk at Home Any stairs in or around the home?: No If so, are there any without handrails?: No Home free of loose throw rugs in walkways, pet beds, electrical cords, etc?: Yes Adequate lighting in your home to reduce risk of falls?: Yes Life alert?: No Use of a cane, walker or w/c?: No Grab bars in the bathroom?: Yes Shower chair or bench in shower?: Yes Elevated toilet seat or a handicapped toilet?: Yes  TIMED UP AND GO:  Was the test performed?  No  Cognitive Function: 6CIT completed    09/16/2018   10:00 AM 11/08/2016    2:59 PM 06/28/2015    8:28 AM  MMSE - Mini Mental State Exam  Orientation to time 2 5 5   Orientation to Place 3 5 5   Registration 3 3 3   Attention/ Calculation 5 5 5   Recall 3 3 2   Language- name 2 objects 2 2 2   Language- repeat 1 1 1   Language- follow 3 step command 3 3 3   Language- read & follow direction 1 1 1   Write a sentence 0 1 1  Copy design 0 1 1  Total score 23 30 29         10/09/2023   11:00 AM 06/11/2020    11:36 AM 09/30/2018    2:57 PM  6CIT Screen  What Year? 0 points 0 points 0 points  What month? 0 points 0 points 0 points  What time? 0 points 0 points 0 points  Count back from 20 0 points 0 points 0 points  Months in reverse 0 points 0 points 0 points  Repeat phrase 0 points 2 points 2 points  Total Score 0 points 2 points 2 points    Immunizations Immunization History  Administered Date(s) Administered   Fluad Quad(high Dose 65+) 03/15/2020   Influenza Split 04/09/2013   Influenza, High Dose Seasonal PF 04/28/2020   Influenza,inj,Quad PF,6+ Mos 03/03/2014, 03/01/2015, 03/01/2015, 03/14/2017, 03/13/2018, 04/11/2019   Influenza-Unspecified 03/01/2016, 03/15/2017, 03/29/2018, 03/10/2023   Moderna Sars-Covid-2 Vaccination 09/07/2020   PFIZER(Purple Top)SARS-COV-2 Vaccination 07/25/2019, 08/20/2019, 02/27/2020   Pneumococcal Conjugate-13 05/30/2011, 12/29/2016   Pneumococcal Polysaccharide-23 05/12/2019   Pneumococcal-Unspecified 05/12/2019   Tdap 09/30/2012   Unspecified SARS-COV-2 Vaccination 03/10/2023   Zoster Recombinant(Shingrix) 03/15/2020, 05/24/2020   Zoster, Live 07/02/2015    Screening Tests Health Maintenance  Topic Date Due   Colonoscopy  05/29/2017   MAMMOGRAM  06/23/2021   DTaP/Tdap/Td (2 - Td or Tdap) 04/15/2024 (Originally 10/01/2022)   COVID-19 Vaccine (6 - Pfizer risk 2024-25 season) 05/20/2024 (Originally 09/08/2023)   DEXA SCAN  10/08/2024 (Originally 02/18/2019)   INFLUENZA VACCINE  12/28/2023   Medicare Annual Wellness (AWV)  10/08/2024   Pneumonia Vaccine 75+ Years old  Completed   Hepatitis C Screening  Completed   Zoster Vaccines- Shingrix  Completed   HPV VACCINES  Aged Out   Meningococcal B Vaccine  Aged Out    Health Maintenance  Health Maintenance Due  Topic Date Due   Colonoscopy  05/29/2017   MAMMOGRAM  06/23/2021   Health Maintenance Items Addressed:  Mammogram ordered, Referral sent to GI for colonoscopy  Additional  Screening:  Vision Screening: Recommended annual ophthalmology exams for early detection of glaucoma and other disorders of the eye.  Dental  Screening: Recommended annual dental exams for proper oral hygiene  Community Resource Referral / Chronic Care Management: CRR required this visit?  No   CCM required this visit?  No   Plan:    I have personally reviewed and noted the following in the patient's chart:   Medical and social history Use of alcohol, tobacco or illicit drugs  Current medications and supplements including opioid prescriptions. Patient is not currently taking opioid prescriptions. Functional ability and status Nutritional status Physical activity Advanced directives List of other physicians Hospitalizations, surgeries, and ER visits in previous 12 months Vitals Screenings to include cognitive, depression, and falls Referrals and appointments  In addition, I have reviewed and discussed with patient certain preventive protocols, quality metrics, and best practice recommendations. A written personalized care plan for preventive services as well as general preventive health recommendations were provided to patient.   Patria Bookbinder, CMA   10/09/2023   After Visit Summary: (MyChart) Due to this being a telephonic visit, the after visit summary with patients personalized plan was offered to patient via MyChart   Notes: Please refer to Routing Comments.

## 2023-10-09 NOTE — Patient Instructions (Signed)
 Ms. Allison Sharp , Thank you for taking time out of your busy schedule to complete your Annual Wellness Visit with me. I enjoyed our conversation and look forward to speaking with you again next year. I, as well as your care team,  appreciate your ongoing commitment to your health goals. Please review the following plan we discussed and let me know if I can assist you in the future. Your Game plan/ To Do List    Referrals: If you haven't heard from the office you've been referred to, please reach out to them at the phone provided.  Referral to [facility] for a Screening Colonoscopy and [facility] for a Mammogram Follow up Visits: Next Medicare AWV with our clinical staff: 10/10/2024   Have you seen your provider in the last 6 months (3 months if uncontrolled diabetes)? Yes Next Office Visit with your provider: 10/29/2023  Clinician Recommendations:  Aim for 30 minutes of exercise or brisk walking, 6-8 glasses of water , and 5 servings of fruits and vegetables each day. Educated and advised on getting the Tdap (Tetenus) and COVID vaccines in 2025.      This is a list of the screening recommended for you and due dates:  Health Maintenance  Topic Date Due   Colon Cancer Screening  05/29/2017   Mammogram  06/23/2021   DTaP/Tdap/Td vaccine (2 - Td or Tdap) 04/15/2024*   COVID-19 Vaccine (6 - Pfizer risk 2024-25 season) 05/20/2024*   DEXA scan (bone density measurement)  10/08/2024*   Flu Shot  12/28/2023   Medicare Annual Wellness Visit  10/08/2024   Pneumonia Vaccine  Completed   Hepatitis C Screening  Completed   Zoster (Shingles) Vaccine  Completed   HPV Vaccine  Aged Out   Meningitis B Vaccine  Aged Out  *Topic was postponed. The date shown is not the original due date.    Advanced directives: (Provided) Advance directive discussed with you today. I have provided a copy for you to complete at home and have notarized. Once this is complete, please bring a copy in to our office so we can  scan it into your chart.  Advance Care Planning is important because it:  [x]  Makes sure you receive the medical care that is consistent with your values, goals, and preferences  [x]  It provides guidance to your family and loved ones and reduces their decisional burden about whether or not they are making the right decisions based on your wishes.  Follow the link provided in your after visit summary or read over the paperwork we have mailed to you to help you started getting your Advance Directives in place. If you need assistance in completing these, please reach out to us  so that we can help you!

## 2023-10-29 ENCOUNTER — Encounter: Payer: Self-pay | Admitting: Emergency Medicine

## 2023-10-29 ENCOUNTER — Ambulatory Visit: Admitting: Emergency Medicine

## 2023-10-31 ENCOUNTER — Other Ambulatory Visit: Payer: Self-pay | Admitting: Emergency Medicine

## 2023-11-12 ENCOUNTER — Other Ambulatory Visit: Payer: Self-pay | Admitting: Emergency Medicine

## 2023-11-15 ENCOUNTER — Telehealth (HOSPITAL_COMMUNITY): Payer: Self-pay

## 2023-11-15 ENCOUNTER — Ambulatory Visit (HOSPITAL_COMMUNITY): Admitting: Psychiatry

## 2023-11-15 NOTE — Telephone Encounter (Signed)
 Patient is calling to let you know that she may meed a medication change, patient states that she is hearing voices and they are driving her crazy. Patient does not currently have a follow up until August. She said that she is taking all of her medications as prescribed. Please review and advise, thank you

## 2023-11-15 NOTE — Telephone Encounter (Signed)
 She needs an earlier appointment in person only.  No change in the medication until had a in person visit.

## 2023-11-16 DIAGNOSIS — G9389 Other specified disorders of brain: Secondary | ICD-10-CM | POA: Diagnosis not present

## 2023-11-16 DIAGNOSIS — Z79899 Other long term (current) drug therapy: Secondary | ICD-10-CM | POA: Diagnosis not present

## 2023-11-16 DIAGNOSIS — M25561 Pain in right knee: Secondary | ICD-10-CM | POA: Diagnosis not present

## 2023-11-16 DIAGNOSIS — M25461 Effusion, right knee: Secondary | ICD-10-CM | POA: Diagnosis not present
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
## 2023-11-17 DIAGNOSIS — I498 Other specified cardiac arrhythmias: Secondary | ICD-10-CM | POA: Diagnosis not present

## 2023-11-29 ENCOUNTER — Ambulatory Visit (HOSPITAL_COMMUNITY): Admitting: Psychiatry

## 2023-12-03 ENCOUNTER — Telehealth (HOSPITAL_COMMUNITY): Payer: Self-pay | Admitting: Psychiatry
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
## 2023-12-04 ENCOUNTER — Other Ambulatory Visit: Payer: Self-pay | Admitting: Emergency Medicine

## 2023-12-31 ENCOUNTER — Ambulatory Visit: Payer: Medicare Other | Admitting: Emergency Medicine

## 2024-01-02 ENCOUNTER — Other Ambulatory Visit (HOSPITAL_COMMUNITY): Payer: Self-pay | Admitting: Psychiatry

## 2024-01-02 ENCOUNTER — Other Ambulatory Visit: Payer: Self-pay | Admitting: Emergency Medicine

## 2024-01-02 DIAGNOSIS — F3160 Bipolar disorder, current episode mixed, unspecified: Secondary | ICD-10-CM

## 2024-01-02 DIAGNOSIS — F5105 Insomnia due to other mental disorder: Secondary | ICD-10-CM

## 2024-01-02 DIAGNOSIS — F411 Generalized anxiety disorder: Secondary | ICD-10-CM

## 2024-01-04 ENCOUNTER — Telehealth (HOSPITAL_COMMUNITY): Admitting: Psychiatry

## 2024-01-10 ENCOUNTER — Telehealth (HOSPITAL_BASED_OUTPATIENT_CLINIC_OR_DEPARTMENT_OTHER): Admitting: Psychiatry

## 2024-01-10 ENCOUNTER — Telehealth: Payer: Self-pay | Admitting: Cardiology

## 2024-01-10 ENCOUNTER — Encounter (HOSPITAL_COMMUNITY): Payer: Self-pay | Admitting: Psychiatry

## 2024-01-10 VITALS — Wt 367.0 lb

## 2024-01-10 DIAGNOSIS — I10 Essential (primary) hypertension: Secondary | ICD-10-CM

## 2024-01-10 DIAGNOSIS — G4733 Obstructive sleep apnea (adult) (pediatric): Secondary | ICD-10-CM

## 2024-01-10 DIAGNOSIS — F3160 Bipolar disorder, current episode mixed, unspecified: Secondary | ICD-10-CM | POA: Diagnosis not present

## 2024-01-10 DIAGNOSIS — F5105 Insomnia due to other mental disorder: Secondary | ICD-10-CM

## 2024-01-10 DIAGNOSIS — F4310 Post-traumatic stress disorder, unspecified: Secondary | ICD-10-CM

## 2024-01-10 DIAGNOSIS — I25118 Atherosclerotic heart disease of native coronary artery with other forms of angina pectoris: Secondary | ICD-10-CM

## 2024-01-10 DIAGNOSIS — F411 Generalized anxiety disorder: Secondary | ICD-10-CM

## 2024-01-10 DIAGNOSIS — R251 Tremor, unspecified: Secondary | ICD-10-CM

## 2024-01-10 MED ORDER — DULOXETINE HCL 60 MG PO CPEP: 60.0000 mg | ORAL_CAPSULE | Freq: Every morning | ORAL | 2 refills | Status: DC

## 2024-01-10 MED ORDER — TRAZODONE HCL 100 MG PO TABS
100.0000 mg | ORAL_TABLET | Freq: Every day | ORAL | 2 refills | Status: DC
Start: 2024-01-10 — End: 2024-03-27

## 2024-01-10 MED ORDER — BENZTROPINE MESYLATE 0.5 MG PO TABS: 0.5000 mg | ORAL_TABLET | Freq: Every day | ORAL | 2 refills | Status: DC

## 2024-01-10 MED ORDER — DIVALPROEX SODIUM 500 MG PO DR TAB: 1000.0000 mg | DELAYED_RELEASE_TABLET | Freq: Every day | ORAL | 2 refills | Status: DC

## 2024-01-10 MED ORDER — DULOXETINE HCL 30 MG PO CPEP: 30.0000 mg | ORAL_CAPSULE | Freq: Every day | ORAL | 2 refills | Status: DC

## 2024-01-10 MED ORDER — RISPERIDONE 3 MG PO TABS: 6.0000 mg | ORAL_TABLET | Freq: Every day | ORAL | 2 refills | Status: DC

## 2024-01-10 NOTE — Telephone Encounter (Signed)
 Patient says she switched companies for CPAP supplies. New company is [employer] in [place] . They need updated information for new supplies: chart notes, sleep study, and prescription for supplies. Must be faxed to [phone].
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
## 2024-01-23 NOTE — Progress Notes (Deleted)
 Pt no show for appt.

## 2024-01-23 NOTE — Telephone Encounter (Signed)
 NEW DME=[facility] [place]   All documents sent to [facility] via fax# [phone].

## 2024-01-24 ENCOUNTER — Ambulatory Visit: Attending: Cardiology | Admitting: Internal Medicine

## 2024-01-24 NOTE — Telephone Encounter (Signed)
 Rx sent to [facility] to fax # [phone].

## 2024-01-24 NOTE — Telephone Encounter (Signed)
 [facility] calling to request a prescription with pressure settings be faxed to [phone]

## 2024-01-24 NOTE — Addendum Note (Signed)
 Addended by: JOSHUA DALTON MATSU on: 01/24/2024 01:13 PM   Modules accepted: Orders

## 2024-01-31 ENCOUNTER — Other Ambulatory Visit: Payer: Self-pay | Admitting: Emergency Medicine

## 2024-02-06 ENCOUNTER — Telehealth (HOSPITAL_COMMUNITY): Payer: Self-pay | Admitting: *Deleted

## 2024-02-06 NOTE — Telephone Encounter (Signed)
 Need to take Cymbalta  in am. Needs in person visit for further medication adjustment. Jelina Paulsen

## 2024-02-06 NOTE — Telephone Encounter (Signed)
 Pt called to advise that she has been having low energy and decreased sleep since starting the new medicine. Cymbalta  was increased to 90 mg and Cogentin  0.5 mg added during last visit on 01/10/24. Pt has f/u scheduled for 04/03/24. Please review.

## 2024-02-06 NOTE — Telephone Encounter (Signed)
 Pt advised and verbalizes understanding.

## 2024-02-29 ENCOUNTER — Other Ambulatory Visit: Payer: Self-pay | Admitting: Emergency Medicine

## 2024-03-04 ENCOUNTER — Ambulatory Visit: Payer: Self-pay

## 2024-03-04 NOTE — Telephone Encounter (Signed)
 FYI Only or Action Required?: Action required by provider: request for appointment and clinical question for provider.  Patient was last seen in primary care on 09/12/2023 by Purcell Emil Schanz, MD.  Called Nurse Triage reporting Fall.  Symptoms began a week ago.  Interventions attempted: OTC medications: tylenol .  Symptoms are: unchanged.  Triage Disposition: See PCP When Office is Open (Within 3 Days)  Patient/caregiver understands and will follow disposition?: No, wishes to speak with PCP  Copied from CRM #[id number]. Topic: Clinical - Red Word Triage >> Mar 04, 2024 11:42 AM Allison Sharp wrote: Red Word that prompted transfer to Nurse Triage: fell last week and today, back pain & headaches Reason for Disposition  MILD weakness (e.g., does not interfere with ability to work, go to school, normal activities)  (Exception: Mild weakness is a chronic symptom.)  Answer Assessment - Initial Assessment Questions Offered appts today and until Thursday, patient declined and reports would like to be seen on 03/24/24, with sister appt. Pt's sister request appt for herself and patient for next week, except 03/13/24; lives an hour away.  Advised UC/ED if symptoms worsen.  1. MECHANISM: How did the fall happen?     Week ago fell, trying to take trash can and fell down; with sister.  3. ONSET: When did the fall happen? (e.g., minutes, hours, or days ago)     Week ago 4. LOCATION: What part of the body hit the ground? (e.g., back, buttocks, head, hips, knees, hands, head, stomach)     Back, knees; twisted us  and caused back to hurt 5. INJURY: Did you hurt (injure) yourself when you fell? If Yes, ask: What did you injure? Tell me more about this? (e.g., body area; type of injury; pain severity)     injury 6. PAIN: Is there any pain? If Yes, ask: How bad is the pain? (e.g., Scale 0-10; or none, mild,      8/10; tylenol , not helptng 7. SIZE: For cuts, bruises, or swelling, ask: How  large is it? (e.g., inches or centimeters)      Denies cuts, bruising on arms and knees  9. OTHER SYMPTOMS: Do you have any other symptoms? (e.g., dizziness, fever, weakness; new-onset or worsening).      Feel weak from pain, still able to get up and walk around, take care of self it just takes a while 10. CAUSE: What do you think caused the fall (or falling)? (e.g., dizzy spell, tripped)       Tripped Denies head injury, unconscious, dizziness  Protocols used: Falls and [facility]

## 2024-03-07 NOTE — Telephone Encounter (Signed)
 Scheduled patient with [facility] on 03/19/24 @ 11:20 for fall f/u

## 2024-03-13 ENCOUNTER — Other Ambulatory Visit: Payer: Self-pay | Admitting: Emergency Medicine

## 2024-03-19 ENCOUNTER — Emergency Department (HOSPITAL_COMMUNITY)
Admission: EM | Admit: 2024-03-19 | Discharge: 2024-03-20 | Disposition: A | Discharge status: Home / Self Care | Attending: Emergency Medicine | Admitting: Emergency Medicine

## 2024-03-19 ENCOUNTER — Other Ambulatory Visit: Payer: Self-pay

## 2024-03-19 ENCOUNTER — Encounter (HOSPITAL_COMMUNITY): Payer: Self-pay | Admitting: Emergency Medicine

## 2024-03-19 ENCOUNTER — Emergency Department (HOSPITAL_COMMUNITY)

## 2024-03-19 ENCOUNTER — Ambulatory Visit: Admitting: Family Medicine

## 2024-03-19 DIAGNOSIS — J4489 Other specified chronic obstructive pulmonary disease: Secondary | ICD-10-CM | POA: Diagnosis not present

## 2024-03-19 DIAGNOSIS — Y92009 Unspecified place in unspecified non-institutional (private) residence as the place of occurrence of the external cause: Secondary | ICD-10-CM | POA: Insufficient documentation

## 2024-03-19 DIAGNOSIS — I6782 Cerebral ischemia: Secondary | ICD-10-CM | POA: Diagnosis not present

## 2024-03-19 DIAGNOSIS — W19XXXA Unspecified fall, initial encounter: Secondary | ICD-10-CM

## 2024-03-19 DIAGNOSIS — I110 Hypertensive heart disease with heart failure: Secondary | ICD-10-CM | POA: Insufficient documentation

## 2024-03-19 DIAGNOSIS — W01198A Fall on same level from slipping, tripping and stumbling with subsequent striking against other object, initial encounter: Secondary | ICD-10-CM | POA: Insufficient documentation

## 2024-03-19 DIAGNOSIS — G319 Degenerative disease of nervous system, unspecified: Secondary | ICD-10-CM | POA: Diagnosis not present

## 2024-03-19 DIAGNOSIS — I509 Heart failure, unspecified: Secondary | ICD-10-CM | POA: Diagnosis not present

## 2024-03-19 DIAGNOSIS — S199XXA Unspecified injury of neck, initial encounter: Secondary | ICD-10-CM | POA: Diagnosis not present

## 2024-03-19 DIAGNOSIS — S0990XA Unspecified injury of head, initial encounter: Secondary | ICD-10-CM | POA: Insufficient documentation

## 2024-03-19 DIAGNOSIS — Z7901 Long term (current) use of anticoagulants: Secondary | ICD-10-CM | POA: Insufficient documentation

## 2024-03-19 DIAGNOSIS — R55 Syncope and collapse: Secondary | ICD-10-CM | POA: Insufficient documentation

## 2024-03-19 DIAGNOSIS — Z79899 Other long term (current) drug therapy: Secondary | ICD-10-CM | POA: Diagnosis not present

## 2024-03-19 DIAGNOSIS — F0390 Unspecified dementia without behavioral disturbance: Secondary | ICD-10-CM | POA: Diagnosis not present

## 2024-03-19 DIAGNOSIS — I2510 Atherosclerotic heart disease of native coronary artery without angina pectoris: Secondary | ICD-10-CM | POA: Insufficient documentation

## 2024-03-19 DIAGNOSIS — S299XXA Unspecified injury of thorax, initial encounter: Secondary | ICD-10-CM | POA: Diagnosis not present

## 2024-03-19 LAB — CBC
HCT: 43.3 % (ref 36.0–46.0)
Hemoglobin: 14.4 g/dL (ref 12.0–15.0)
MCH: 31.1 pg (ref 26.0–34.0)
MCHC: 33.3 g/dL (ref 30.0–36.0)
MCV: 93.5 fL (ref 80.0–100.0)
Platelets: 156 K/uL (ref 150–400)
RBC: 4.63 MIL/uL (ref 3.87–5.11)
RDW: 12.4 % (ref 11.5–15.5)
WBC: 8.3 K/uL (ref 4.0–10.5)
nRBC: 0 % (ref 0.0–0.2)

## 2024-03-19 LAB — I-STAT CHEM 8, ED
BUN: 15 mg/dL (ref 8–23)
Calcium, Ion: 1.18 mmol/L (ref 1.15–1.40)
Chloride: 98 mmol/L (ref 98–111)
Creatinine, Ser: 1 mg/dL (ref 0.44–1.00)
Glucose, Bld: 122 mg/dL — ABNORMAL HIGH (ref 70–99)
HCT: 43 % (ref 36.0–46.0)
Hemoglobin: 14.6 g/dL (ref 12.0–15.0)
Potassium: 3.6 mmol/L (ref 3.5–5.1)
Sodium: 138 mmol/L (ref 135–145)
TCO2: 29 mmol/L (ref 22–32)

## 2024-03-19 LAB — BASIC METABOLIC PANEL WITH GFR
Anion gap: 13 (ref 5–15)
BUN: 14 mg/dL (ref 8–23)
CO2: 27 mmol/L (ref 22–32)
Calcium: 9 mg/dL (ref 8.9–10.3)
Chloride: 95 mmol/L — ABNORMAL LOW (ref 98–111)
Creatinine, Ser: 0.92 mg/dL (ref 0.44–1.00)
GFR, Estimated: >60 mL/min (ref >60)
Glucose, Bld: 119 mg/dL — ABNORMAL HIGH (ref 70–99)
Potassium: 3.6 mmol/L (ref 3.5–5.1)
Sodium: 135 mmol/L (ref 135–145)

## 2024-03-19 LAB — TROPONIN I (HIGH SENSITIVITY): Troponin I (High Sensitivity): 4 ng/L (ref <18)

## 2024-03-19 LAB — I-STAT CG4 LACTIC ACID, ED: Lactic Acid, Venous: 1.4 mmol/L (ref 0.5–1.9)

## 2024-03-19 NOTE — ED Triage Notes (Addendum)
 Pt BIB [facility] from home, fall on thinners from bilateral leg weakness and dizziness, denies LOC, on Eliquis, hx of stroke, no deficits, v/s en route 136/78, HR 78, CBG 177, c/o headache, pt hit back of head on refrigerator, EDP at bedside

## 2024-03-19 NOTE — Progress Notes (Signed)
 Orthopedic Tech Progress Note Patient Details:  Allison Sharp 09-11-1955 [id number]  Patient ID: [name], female   DOB: 1955/06/07, 68 y.o.   MRN: [id number] LV2T FOT. No obvious injuries. Not needed at this time. Giovanni LITTIE Lukes 03/19/2024, 10:29 PM
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
## 2024-03-19 NOTE — ED Provider Notes (Incomplete)
 Fall, cray  Stand and walk > if no MRI > PT/OT Yes > bye

## 2024-03-19 NOTE — ED Provider Notes (Signed)
 [place] EMERGENCY DEPARTMENT AT [facility] Provider Note   CSN: [id number] Arrival date & time: 03/19/24  2221     Patient presents with: Fall (ON THINNERS)   Allison Sharp is a 68 y.o. female who presents the emergency department as a level 2 trauma for fall on blood thinners patient arrives with [facility].  Gathered at bedside from the patient and from the paramedic Charlie Ku.  Patient apparently called out for fall.  She reports that she was feeling dizzy and lightheaded fell backward and hit her head on her refrigerator.  EMS states that she has no obvious trauma but is on Eliquis.  Patient denies any black or tarry stools but it states that she sees some [facility] right now which is abnormal for her.  She denies loss of consciousness, nausea, vomiting and has a mild headache since hitting her head.    Fall       Prior to Admission medications   Medication Sig Start Date End Date Taking? Authorizing Provider  atorvastatin  (LIPITOR) 20 MG tablet TAKE ONE TABLET AT BEDTIME 03/13/24   Purcell Emil Schanz, MD  benztropine  (COGENTIN ) 0.5 MG tablet Take 1 tablet (0.5 mg total) by mouth at bedtime. 01/10/24   Arfeen, Leni DASEN, MD  BREZTRI AEROSPHERE 160-9-4.8 MCG/ACT AERO daily. Per patient taking 1 puff daily    [provider]  cetirizine  (ZYRTEC ) 10 MG tablet TAKE 1 TABLET ONCE DAILY 12/17/20   Dettinger, Fonda LABOR, MD  clotrimazole -betamethasone  (LOTRISONE ) cream APPLY TO AFFECTED AREA 2 TIMES A DAY 10/31/23   Sagardia, Emil Schanz, MD  divalproex  (DEPAKOTE ) 500 MG DR tablet Take 2 tablets (1,000 mg total) by mouth at bedtime. 01/10/24   Arfeen, Leni DASEN, MD  DULoxetine  (CYMBALTA ) 30 MG capsule Take 1 capsule (30 mg total) by mouth daily. 01/10/24 04/09/24  Arfeen, Leni DASEN, MD  DULoxetine  (CYMBALTA ) 60 MG capsule Take 1 capsule (60 mg total) by mouth in the morning. 01/10/24   Arfeen, Leni DASEN, MD  ELIQUIS 5 MG TABS tablet TAKE ONE TABLET TWICE DAILY  02/29/24   Sagardia, Miguel Jose, MD  furosemide  (LASIX ) 20 MG tablet Take 20 mg by mouth as needed. Takes if gaines 3 pounds 07/02/23   Purcell Emil Schanz, MD  levothyroxine  (SYNTHROID ) 125 MCG tablet TAKE ONE TABLET BY MOUTH EVERY DAY 02/29/24   Purcell Emil Schanz, MD  linaclotide  (LINZESS ) 145 MCG CAPS capsule Take 1 capsule (145 mcg total) by mouth daily before breakfast. 07/02/23   Purcell Emil Schanz, MD  metoprolol succinate (TOPROL-XL) 25 MG 24 hr tablet TAKE ONE TABLET ONCE DAILY 11/12/23   Sagardia, Miguel Jose, MD  omeprazole  (PRILOSEC) 20 MG capsule TAKE TWO CAPSULES DAILY 11/12/23   Purcell Emil Schanz, MD  risperiDONE  (RISPERDAL ) 3 MG tablet Take 2 tablets (6 mg total) by mouth at bedtime. 01/10/24 04/09/24  Arfeen, Leni DASEN, MD  traZODone  (DESYREL ) 100 MG tablet Take 1 tablet (100 mg total) by mouth at bedtime. 01/10/24   Arfeen, Leni DASEN, MD    Allergies: Abilify  [aripiprazole ], Naproxen , Ativan [lorazepam], Haldol [haloperidol], and Hydroxyzine     Review of Systems  Updated Vital Signs BP (!) 146/82   LMP 02/18/1995   Physical Exam Vitals and nursing note reviewed.  Constitutional:      General: She is not in acute distress.    Appearance: She is well-developed. She is not diaphoretic.  HENT:     Head: Normocephalic and atraumatic.     Right Ear:  External ear normal.     Left Ear: External ear normal.     Nose: Nose normal.     Mouth/Throat:     Mouth: Mucous membranes are moist.     Pharynx: No oropharyngeal exudate or posterior oropharyngeal erythema.  Eyes:     General: No scleral icterus.    Extraocular Movements: Extraocular movements intact.     Conjunctiva/sclera: Conjunctivae normal.     Pupils: Pupils are equal, round, and reactive to light.  Cardiovascular:     Rate and Rhythm: Normal rate and regular rhythm.     Heart sounds: Normal heart sounds. No murmur heard.    No friction rub. No gallop.  Pulmonary:     Effort: Pulmonary effort is normal. No  respiratory distress.     Breath sounds: Normal breath sounds.  Abdominal:     General: Bowel sounds are normal. There is no distension.     Palpations: Abdomen is soft. There is no mass.     Tenderness: There is no abdominal tenderness. There is no guarding.  Musculoskeletal:     Cervical back: Normal range of motion.  Skin:    General: Skin is warm and dry.  Neurological:     Mental Status: She is alert and oriented to person, place, and time.  Psychiatric:        Behavior: Behavior normal.     (all labs ordered are listed, but only abnormal results are displayed) Labs Reviewed - No data to display  EKG: None  Radiology: No results found.   Procedures   Medications Ordered in the ED - No data to display                                  Medical Decision Making Amount and/or Complexity of Data Reviewed Labs: ordered. Radiology: ordered.   This patient presents to the ED for concern of trauma in the setting of near syncope, this involves an extensive number of treatment options, and is a complaint that carries with it a high risk of complications and morbidity.  Differential diagnosis includes near syncope, stroke, vertigo, orthostatic hypotension due to bleeding, arrhythmia, head injury, subarachnoid hemorrhage, subdural hematoma, skull fracture less likely.  Co morbidities:   has a past medical history of Allergy, Anxiety, Arthritis, Asthma, Bipolar 1 disorder (HCC), CAD (coronary artery disease), Cellulitis, CHF (congestive heart failure) (HCC), Chronic back pain, Chronic headaches, Complication of anesthesia, Contusion of sacrum, COPD (chronic obstructive pulmonary disease) (HCC), Dementia (HCC), Depression, Dyspnea, Fungal infection, GERD (gastroesophageal reflux disease), History of colonoscopy (10/17/2002), HTN (hypertension), Hyperlipidemia, Hyperthyroidism, Migraine headache, Morbid obesity with body mass index of 50.0-59.9 in adult [facility]) (JAN 2011 370 LBS),  Myocardial infarction (HCC), OSA on CPAP, PONV (postoperative nausea and vomiting), PTSD (post-traumatic stress disorder), PTSD (post-traumatic stress disorder), Suicidal ideation, Urine incontinence, and Vitamin D  deficiency.   Social Determinants of Health:  = SDOH Screenings   Food Insecurity: No Food Insecurity (10/09/2023)  Housing: Unknown (10/09/2023)  Transportation Needs: No Transportation Needs (10/09/2023)  Utilities: Not At Risk (10/09/2023)  Alcohol Screen: Low Risk  (10/09/2023)  Depression (PHQ2-9): Medium Risk (10/09/2023)  Financial Resource Strain: Low Risk  (10/09/2023)  Physical Activity: Insufficiently Active (10/09/2023)  Social Connections: Socially Isolated (10/09/2023)  Stress: No Stress Concern Present (10/09/2023)  Tobacco Use: Low Risk  (03/19/2024)  Health Literacy: Adequate Health Literacy (10/09/2023)     Additional history:  {Additional history obtained from  EMS Reviewed.  Previous records from the electronic medical records Lab Tests:  I Ordered, and personally interpreted labs.  The pertinent results include:   Labs reviewed,  Imaging Studies:  I ordered imaging studies including chest x-ray CT head CT C-spine I independently visualized and interpreted imaging which showed no acute findings I agree with the radiologist interpretation  Cardiac Monitoring/ECG:  The patient was maintained on a cardiac monitor.  I personally viewed and interpreted the cardiac monitored which showed an underlying rhythm of:    Critical Interventions:    Consultations Obtained:   Problem List / ED Course:     ICD-10-CM   1. Fall, initial encounter  W19.XXXA     2. Minor head injury, initial encounter  S09.90XA       MDM:  Patient here after acute fall, evaluated in the emergency department for symptoms.  She was monitored on cardiac monitor with normal sinus rhythm.  Patient was able to ambulate to and from the bathroom with minimal assistance.  She is  walking at her baseline.  She has some baseline ambulatory dysfunction due to her obesity and age.  Patient signout given to PA [facility] waiting for second troponin, initial labs are reassuring.        Final diagnoses:  Fall, initial encounter  Minor head injury, initial encounter    ED Discharge Orders     None          Arloa Chroman, PA-C 03/22/24 1123    Tegeler, Lonni PARAS, MD 03/26/24 [phone]

## 2024-03-19 NOTE — ED Provider Notes (Incomplete)
 [place] EMERGENCY DEPARTMENT AT [facility] Provider Note   CSN: [id number] Arrival date & time: 03/19/24  2221     Patient presents with: Fall (ON THINNERS)   Allison Sharp is a 68 y.o. female who presents the emergency department as a level 2 trauma for fall on blood thinners patient arrives with [facility].  Gathered at bedside from the patient and from the paramedic Charlie Ku.  Patient apparently called out for fall.  She reports that she was feeling dizzy and lightheaded fell backward and hit her head on her refrigerator.  EMS states that she has no obvious trauma but is on Eliquis.  Patient denies any black or tarry stools but it states that she sees some [facility] right now which is abnormal for her.  She denies loss of consciousness, nausea, vomiting and has a mild headache since hitting her head.  {Add pertinent medical, surgical, social history, OB history to YEP:67052}  Fall       Prior to Admission medications   Medication Sig Start Date End Date Taking? Authorizing Provider  atorvastatin  (LIPITOR) 20 MG tablet TAKE ONE TABLET AT BEDTIME 03/13/24   Purcell Emil Schanz, MD  benztropine  (COGENTIN ) 0.5 MG tablet Take 1 tablet (0.5 mg total) by mouth at bedtime. 01/10/24   Arfeen, Leni DASEN, MD  BREZTRI AEROSPHERE 160-9-4.8 MCG/ACT AERO daily. Per patient taking 1 puff daily    [provider]  cetirizine  (ZYRTEC ) 10 MG tablet TAKE 1 TABLET ONCE DAILY 12/17/20   Dettinger, Fonda LABOR, MD  clotrimazole -betamethasone  (LOTRISONE ) cream APPLY TO AFFECTED AREA 2 TIMES A DAY 10/31/23   Sagardia, Emil Schanz, MD  divalproex  (DEPAKOTE ) 500 MG DR tablet Take 2 tablets (1,000 mg total) by mouth at bedtime. 01/10/24   Arfeen, Leni DASEN, MD  DULoxetine  (CYMBALTA ) 30 MG capsule Take 1 capsule (30 mg total) by mouth daily. 01/10/24 04/09/24  Arfeen, Leni DASEN, MD  DULoxetine  (CYMBALTA ) 60 MG capsule Take 1 capsule (60 mg total) by mouth in the morning. 01/10/24    Arfeen, Leni DASEN, MD  ELIQUIS 5 MG TABS tablet TAKE ONE TABLET TWICE DAILY 02/29/24   Sagardia, Miguel Jose, MD  furosemide  (LASIX ) 20 MG tablet Take 20 mg by mouth as needed. Takes if gaines 3 pounds 07/02/23   Purcell Emil Schanz, MD  levothyroxine  (SYNTHROID ) 125 MCG tablet TAKE ONE TABLET BY MOUTH EVERY DAY 02/29/24   Purcell Emil Schanz, MD  linaclotide  (LINZESS ) 145 MCG CAPS capsule Take 1 capsule (145 mcg total) by mouth daily before breakfast. 07/02/23   Purcell Emil Schanz, MD  metoprolol succinate (TOPROL-XL) 25 MG 24 hr tablet TAKE ONE TABLET ONCE DAILY 11/12/23   Sagardia, Miguel Jose, MD  omeprazole  (PRILOSEC) 20 MG capsule TAKE TWO CAPSULES DAILY 11/12/23   Purcell Emil Schanz, MD  risperiDONE  (RISPERDAL ) 3 MG tablet Take 2 tablets (6 mg total) by mouth at bedtime. 01/10/24 04/09/24  Arfeen, Leni DASEN, MD  traZODone  (DESYREL ) 100 MG tablet Take 1 tablet (100 mg total) by mouth at bedtime. 01/10/24   Arfeen, Leni DASEN, MD    Allergies: Abilify  [aripiprazole ], Naproxen , Ativan [lorazepam], Haldol [haloperidol], and Hydroxyzine     Review of Systems  Updated Vital Signs BP (!) 146/82   LMP 02/18/1995   Physical Exam Vitals and nursing note reviewed.  Constitutional:      General: She is not in acute distress.    Appearance: She is well-developed. She is not diaphoretic.  HENT:     Head:  Normocephalic and atraumatic.     Right Ear: External ear normal.     Left Ear: External ear normal.     Nose: Nose normal.     Mouth/Throat:     Mouth: Mucous membranes are moist.     Pharynx: No oropharyngeal exudate or posterior oropharyngeal erythema.  Eyes:     General: No scleral icterus.    Extraocular Movements: Extraocular movements intact.     Conjunctiva/sclera: Conjunctivae normal.     Pupils: Pupils are equal, round, and reactive to light.  Cardiovascular:     Rate and Rhythm: Normal rate and regular rhythm.     Heart sounds: Normal heart sounds. No murmur heard.    No friction  rub. No gallop.  Pulmonary:     Effort: Pulmonary effort is normal. No respiratory distress.     Breath sounds: Normal breath sounds.  Abdominal:     General: Bowel sounds are normal. There is no distension.     Palpations: Abdomen is soft. There is no mass.     Tenderness: There is no abdominal tenderness. There is no guarding.  Musculoskeletal:     Cervical back: Normal range of motion.  Skin:    General: Skin is warm and dry.  Neurological:     Mental Status: She is alert and oriented to person, place, and time.  Psychiatric:        Behavior: Behavior normal.     (all labs ordered are listed, but only abnormal results are displayed) Labs Reviewed - No data to display  EKG: None  Radiology: No results found.  {Document cardiac monitor, telemetry assessment procedure when appropriate:32947} Procedures   Medications Ordered in the ED - No data to display    {Click here for ABCD2, HEART and other calculators REFRESH Note before signing:1}                              Medical Decision Making  This patient presents to the ED for concern of trauma in the setting of near syncope, this involves an extensive number of treatment options, and is a complaint that carries with it a high risk of complications and morbidity.  Differential diagnosis includes near syncope, stroke, vertigo, orthostatic hypotension due to bleeding, arrhythmia, head injury, subarachnoid hemorrhage, subdural hematoma, skull fracture less likely.  Co morbidities: .  has a past medical history of Allergy, Anxiety, Arthritis, Asthma, Bipolar 1 disorder (HCC), CAD (coronary artery disease), Cellulitis, CHF (congestive heart failure) (HCC), Chronic back pain, Chronic headaches, Complication of anesthesia, Contusion of sacrum, COPD (chronic obstructive pulmonary disease) (HCC), Dementia (HCC), Depression, Dyspnea, Fungal infection, GERD (gastroesophageal reflux disease), History of colonoscopy (10/17/2002), HTN  (hypertension), Hyperlipidemia, Hyperthyroidism, Migraine headache, Morbid obesity with body mass index of 50.0-59.9 in adult [facility]) (JAN 2011 370 LBS), Myocardial infarction (HCC), OSA on CPAP, PONV (postoperative nausea and vomiting), PTSD (post-traumatic stress disorder), PTSD (post-traumatic stress disorder), Suicidal ideation, Urine incontinence, and Vitamin D  deficiency.   Social Determinants of Health:  .= SDOH Screenings   Food Insecurity: No Food Insecurity (10/09/2023)  Housing: Unknown (10/09/2023)  Transportation Needs: No Transportation Needs (10/09/2023)  Utilities: Not At Risk (10/09/2023)  Alcohol Screen: Low Risk  (10/09/2023)  Depression (PHQ2-9): Medium Risk (10/09/2023)  Financial Resource Strain: Low Risk  (10/09/2023)  Physical Activity: Insufficiently Active (10/09/2023)  Social Connections: Socially Isolated (10/09/2023)  Stress: No Stress Concern Present (10/09/2023)  Tobacco Use: Low Risk  (03/19/2024)  Health  Literacy: Adequate Health Literacy (10/09/2023)     Additional history:  {Additional history obtained from EMS Reviewed.  Previous records from the electronic medical records Lab Tests:  I Ordered, and personally interpreted labs.  The pertinent results include:   Labs reviewed,  Imaging Studies:  I ordered imaging studies including chest x-ray CT head CT C-spine I independently visualized and interpreted imaging which showed no acute findings I agree with the radiologist interpretation  Cardiac Monitoring/ECG:  .The patient was maintained on a cardiac monitor.  I personally viewed and interpreted the cardiac monitored which showed an underlying rhythm of: ***  Medicines ordered and prescription drug management:  I ordered medication including Medications - No data to display for *** Reevaluation of the patient after these medicines showed that the patient {resolved/improved/worsened:23923::improved} I have reviewed the patients home medicines and  have made adjustments as needed  Test Considered:  .***  Critical Interventions:  .***  Consultations Obtained: ***  Problem List / ED Course:  .No diagnosis found.  MDM: ***   Dispostion:  After consideration of the diagnostic results and the patients response to treatment, I feel that the patent would benefit from ***. ,  {Document critical care time when appropriate  Document review of labs and clinical decision tools ie CHADS2VASC2, etc  Document your independent review of radiology images and any outside records  Document your discussion with family members, caretakers and with consultants  Document social determinants of health affecting pt's care  Document your decision making why or why not admission, treatments were needed:32947:::1}   Final diagnoses:  None    ED Discharge Orders     None

## 2024-03-19 NOTE — ED Provider Notes (Signed)
 Patient signed out to myself at shift change from [facility], PA-C pending delta troponin and ambulation.  In short, patient was activated as a level 2 trauma fall on thinners when she fell and hit the back of her head against her fridge after having a presyncopal like episode.  Patient reported headache on ED arrival.  Patient able to ambulate with steady gait and still asymptomatic.  Delta troponin 4, 3.  Considered for admission or further workup however patient's vital signs, physical exam, labs, and imaging are reassuring.  Patient able to ambulate without issue prior to discharge.  Patient given return precautions.  I feel patient safe for discharge at this time.    Francis Ileana SAILOR, PA-C 03/20/24 0140    Trine Raynell Moder, MD 03/20/24 [phone]

## 2024-03-20 LAB — TROPONIN I (HIGH SENSITIVITY): Troponin I (High Sensitivity): 3 ng/L (ref <18)

## 2024-03-20 NOTE — ED Notes (Signed)
 Pt ambulated to BR with steady gait, pt denies changes from baseline

## 2024-03-20 NOTE — Discharge Instructions (Signed)
 Today you were seen after a fall.  You may alternate Tylenol  Motrin  as needed for pain.  Please return to the ED if you have uncontrollable vomiting, double vision, or numbness or weakness.  Thank you for letting us  treat you today. After reviewing your labs and imaging, I feel you are safe to go home. Please follow up with your PCP in the next several days and provide them with your records from this visit. Return to the Emergency Room if pain becomes severe or symptoms worsen.

## 2024-03-20 NOTE — ED Notes (Signed)
 Ptar called

## 2024-03-24 ENCOUNTER — Telehealth: Payer: Self-pay

## 2024-03-24 NOTE — Telephone Encounter (Signed)
 Copied from CRM [id number]. Topic: Clinical - Medical Advice >> Mar 24, 2024  4:41 PM Nessti S wrote: Reason for CRM: pt called to receive flu shot and pneumococcal shot during appt

## 2024-03-25 ENCOUNTER — Telehealth: Payer: Self-pay

## 2024-03-25 ENCOUNTER — Telehealth: Payer: Self-pay | Admitting: *Deleted

## 2024-03-25 DIAGNOSIS — F419 Anxiety disorder, unspecified: Secondary | ICD-10-CM

## 2024-03-25 NOTE — Telephone Encounter (Signed)
 Noted in encounter for visit this week.

## 2024-03-25 NOTE — Progress Notes (Signed)
 Complex Care Management Note  Care Guide Note 03/25/2024 Name: Allison Sharp MRN: [id number] DOB: May 07, 1956  Allison Sharp is a 68 y.o. year old female who sees Sagardia, Emil Schanz, MD for primary care. I reached out to [name] by phone today to offer complex care management services.  [name] was given information about Complex Care Management services today including:   The Complex Care Management services include support from the care team which includes your Nurse Care Manager, Clinical Social Worker, or Pharmacist.  The Complex Care Management team is here to help remove barriers to the health concerns and goals most important to you. Complex Care Management services are voluntary, and the patient may decline or stop services at any time by request to their care team member.   Complex Care Management Consent Status: Patient agreed to services and verbal consent obtained.   Follow up plan:  Telephone appointment with complex care management team member scheduled for:  04/17/2024  Encounter Outcome:  Patient Scheduled  Thedford Franks, CMA [place]  [facility], [facility] Guide Direct Dial: [phone]  Fax: [phone] Website: [email or web address]

## 2024-03-26 ENCOUNTER — Other Ambulatory Visit (HOSPITAL_COMMUNITY): Payer: Self-pay | Admitting: Psychiatry

## 2024-03-26 ENCOUNTER — Other Ambulatory Visit: Payer: Self-pay | Admitting: Emergency Medicine

## 2024-03-26 DIAGNOSIS — F411 Generalized anxiety disorder: Secondary | ICD-10-CM

## 2024-03-26 DIAGNOSIS — R251 Tremor, unspecified: Secondary | ICD-10-CM

## 2024-03-27 ENCOUNTER — Other Ambulatory Visit (HOSPITAL_COMMUNITY): Payer: Self-pay | Admitting: *Deleted

## 2024-03-27 ENCOUNTER — Telehealth: Payer: Self-pay

## 2024-03-27 DIAGNOSIS — F5105 Insomnia due to other mental disorder: Secondary | ICD-10-CM

## 2024-03-27 DIAGNOSIS — F411 Generalized anxiety disorder: Secondary | ICD-10-CM

## 2024-03-27 DIAGNOSIS — R251 Tremor, unspecified: Secondary | ICD-10-CM

## 2024-03-27 DIAGNOSIS — F3160 Bipolar disorder, current episode mixed, unspecified: Secondary | ICD-10-CM

## 2024-03-27 MED ORDER — TRAZODONE HCL 100 MG PO TABS: 100.0000 mg | ORAL_TABLET | Freq: Every day | ORAL | 1 refills | Status: DC

## 2024-03-27 MED ORDER — RISPERIDONE 3 MG PO TABS: 6.0000 mg | ORAL_TABLET | Freq: Every day | ORAL | 1 refills | Status: DC

## 2024-03-27 MED ORDER — DULOXETINE HCL 30 MG PO CPEP: 30.0000 mg | ORAL_CAPSULE | Freq: Every day | ORAL | 2 refills | Status: DC

## 2024-03-27 MED ORDER — DIVALPROEX SODIUM 500 MG PO DR TAB: 1000.0000 mg | DELAYED_RELEASE_TABLET | Freq: Every day | ORAL | 1 refills | Status: DC

## 2024-03-27 MED ORDER — DULOXETINE HCL 60 MG PO CPEP: 60.0000 mg | ORAL_CAPSULE | Freq: Every morning | ORAL | 1 refills | Status: AC

## 2024-03-27 MED ORDER — BENZTROPINE MESYLATE 0.5 MG PO TABS: 0.5000 mg | ORAL_TABLET | Freq: Every day | ORAL | 1 refills | Status: DC

## 2024-03-27 NOTE — Transitions of Care (Post Inpatient/ED Visit) (Signed)
   03/27/2024  Name: Allison Sharp MRN: [id number] DOB: 1955/06/30  Today's TOC FU Call Status: Today's TOC FU Call Status:: Successful TOC FU Call Completed Patient's Name and Date of Birth confirmed.  Transition Care Management Follow-up Telephone Call Date of Discharge: 03/19/24 Discharge Facility: [facility]) Type of Discharge: Emergency Department Reason for ED Visit: Other: How have you been since you were released from the hospital?: Worse Any questions or concerns?: No  Items Reviewed: Did you receive and understand the discharge instructions provided?: Yes Medications obtained,verified, and reconciled?: Yes (Medications Reviewed) Any new allergies since your discharge?: No Dietary orders reviewed?: NA Do you have support at home?: Yes People in Home [RPT]: sibling(s)  Medications Reviewed Today: Medications Reviewed Today   Medications were not reviewed in this encounter     Home Care and Equipment/Supplies: Were Home Health Services Ordered?: No Any new equipment or medical supplies ordered?: No  Functional Questionnaire: Do you need assistance with bathing/showering or dressing?: No Do you need assistance with meal preparation?: No Do you need assistance with eating?: No Do you have difficulty maintaining continence: No Do you need assistance with getting out of bed/getting out of a chair/moving?: No Do you have difficulty managing or taking your medications?: No  Follow up appointments reviewed: PCP Follow-up appointment confirmed?: Yes Date of PCP follow-up appointment?: 03/28/24 Follow-up Provider: [facility] Follow-up appointment confirmed?: NA Do you need transportation to your follow-up appointment?: No Do you understand care options if your condition(s) worsen?: Yes-patient verbalized understanding    SIGNATURE Edsel Kerns, CMA

## 2024-03-28 ENCOUNTER — Ambulatory Visit: Admitting: Internal Medicine

## 2024-03-28 ENCOUNTER — Other Ambulatory Visit: Payer: Self-pay | Admitting: Emergency Medicine

## 2024-03-28 ENCOUNTER — Ambulatory Visit

## 2024-04-03 ENCOUNTER — Ambulatory Visit (HOSPITAL_COMMUNITY): Admitting: Psychiatry

## 2024-04-17 ENCOUNTER — Other Ambulatory Visit: Payer: Self-pay | Admitting: *Deleted

## 2024-04-17 NOTE — Patient Outreach (Signed)
 EMMI referral received related to Anxiety. Patient contacted, reason for referral discussed. Patient states that she is active with a psychiatrist-Dr. Curry denies need for ongoing follow up with a therapist.  Patient further states that she will be following up with a new primary care doctor in [facility]. Patient denied any community resource needs at this time and agrees to contact this child psychotherapist if the need arises.  Allison Campton, LCSW [place]  [facility], [facility] Licensed Clinical Social Worker  Direct Dial: [phone]

## 2024-04-25 ENCOUNTER — Other Ambulatory Visit: Payer: Self-pay | Admitting: Emergency Medicine

## 2024-04-29 ENCOUNTER — Other Ambulatory Visit: Payer: Self-pay | Admitting: Emergency Medicine

## 2024-05-08 ENCOUNTER — Ambulatory Visit (HOSPITAL_COMMUNITY): Admitting: Psychiatry

## 2024-05-11 NOTE — Progress Notes (Deleted)
 Cardiology Office Note   Date:  05/11/2024   ID:  Allison Sharp, DOB 02-28-1956, MRN [id number]  PCP:  Purcell Emil Schanz, MD  Cardiologist:   Vina Gull, MD       History of Present Illness: Allison Sharp is a 68 y.o. female with a history of chest pressure, palpitations, PAF (CHADS2VASc of 3), HTN ,  orthostatic hypotension. HL Cath in 2020 showed no significant CAD   The pt was hospitalized  in Aug 2022 with orthostatic hypotension and syncope   [name] and passed out  Clonidine  and lasix  stopped.  Echo showed normal biventricular function   Sent home with event monitor  This showed atrial fibrillation as well as NSVT  After D/c she was seen in cardiology clinic at [facility] in Aug    Recomm a PET CT perfusion scan due to size.    Monitor showed atrial fibrillation.  Recomm Eliquis and Toprol XL    The pt denies dizziness.   Breathing is OK   Denies palpitations      Says she is feeling better   No CP    I saw the pt in 2023   She was seen by T Turner in the interval    No outpatient medications have been marked as taking for the 05/13/24 encounter (Appointment) with Gull Vina GAILS, MD.     Allergies:   Abilify  [aripiprazole ], Naproxen , Ativan [lorazepam], Haldol [haloperidol], and Hydroxyzine    Past Medical History:  Diagnosis Date   Allergy    Anxiety    Arthritis    Asthma    Bipolar 1 disorder (HCC)    CAD (coronary artery disease)    Cellulitis    CHF (congestive heart failure) (HCC)    diastolic dysfunction   Chronic back pain    Chronic headaches    Complication of anesthesia    States she typically gets sick s/p anesthesia   Contusion of sacrum    COPD (chronic obstructive pulmonary disease) (HCC)    Dementia (HCC)    Depression    Dyspnea    PFT 03/05/09 FEV1 2.77(98%), FVC 3.25(86%), FEV1% 85, TLC 5.88(99%), DLCO 60% ,  Methacholine challenge 03/16/09 normal ,  CT chest 03/12/09 no pulmonary disease   Fungal infection     GERD (gastroesophageal reflux disease)    History of colonoscopy 10/17/2002   by Dr Classie distal non-specific proctitis, small ext hemorrhoids,    HTN (hypertension)    Hyperlipidemia    Hyperthyroidism    radioactive - thyroid  cancer    Migraine headache    Morbid obesity with body mass index of 50.0-59.9 in adult [facility]) JAN 2011 370 LBS   2004 311 BMI 45.9   Myocardial infarction (HCC)    NOV 1997   OSA on CPAP    she had been on 2L O2 at night but that was stopped   PONV (postoperative nausea and vomiting)    PTSD (post-traumatic stress disorder)    PTSD (post-traumatic stress disorder)    Suicidal ideation    Urine incontinence    Vitamin D  deficiency     Past Surgical History:  Procedure Laterality Date   ABDOMINAL HYSTERECTOMY     sept 1996   APPENDECTOMY     BACK SURGERY  2008   CARDIAC CATHETERIZATION     nov 1997   CHOLECYSTECTOMY     COLONOSCOPY  10/17/2002    Distal proctitis, small external hemorrhoids, otherwise/  normal colonoscopy. Suspect rectal bleeding secondary to hemorrhoids   ESOPHAGOGASTRODUODENOSCOPY  03/18/09   fundic gland polyps/mild gastritis   HERNIA REPAIR  1978   JOINT REPLACEMENT     bil knee replacement   KNEE ARTHROSCOPY     LEFT HEART CATH AND CORONARY ANGIOGRAPHY N/A 08/08/2018   Procedure: LEFT HEART CATH AND CORONARY ANGIOGRAPHY;  Surgeon: Dann Candyce RAMAN, MD;  Location: [facility] INVASIVE CV LAB;  Service: Cardiovascular;  Laterality: N/A;   MULTIPLE EXTRACTIONS WITH ALVEOLOPLASTY N/A 08/16/2015   Procedure: EXTRACTION OF TEETH THREE, SIX, EIGHT, NINE, ELEVEN, FOURTEEN, FIFTEEN, TWENTY-EIGHT WITH ALVEOLOPLASTY;  Surgeon: Glendia Primrose, DDS;  Location: [facility] OR;  Service: Oral Surgery;  Laterality: N/A;   ROTATOR CUFF REPAIR Right    TONSILLECTOMY     TOTAL VAGINAL HYSTERECTOMY     TUBAL LIGATION       Social History:  The patient  reports that she has never smoked. She has never been exposed to tobacco smoke. She has never used  smokeless tobacco. She reports current alcohol use of about 2.0 standard drinks of alcohol per week. She reports that she does not use drugs.   Family History:  The patient's family history includes AAA (abdominal aortic aneurysm) in her mother; Alcohol abuse in her father and son; Arthritis in her sister; Bipolar disorder in her brother, mother, and sister; Cancer in her maternal aunt, maternal grandfather, sister, and son; Coronary artery disease in her brother and father; Dementia in her mother; Depression in her brother, mother, sister, and sister; Drug abuse in her son; Hypertension in her brother, mother, and sister; Irregular heart beat in her sister; Paranoid behavior in her sister. She was adopted.    ROS:  Please see the history of present illness. All other systems are reviewed and  Negative to the above problem except as noted.    PHYSICAL EXAM: VS:  LMP 02/18/1995   GEN: Morbidly obese 68 yo in no acute distress  HEENT: normal  Neck: no JVD.  No carotid bruits Cardiac: RRR; no murmurs  No LE  edema  Respiratory:  clear to auscultation bilaterally, GI: soft, nontender, nondistended, + BS  No hepatomegaly  MS: no deformity Moving all extremities   Skin: warm and dry, no rash Neuro:  Strength and sensation are intact Psych: euthymic mood, full affect   EKG:  EKG is ordered today.  SR 82 bpm  Nonspecific ST changes    L Heart cath   08/08/18  Mid LAD lesion is 10% stenosed. The left ventricular systolic function is normal. LV end diastolic pressure is normal. The left ventricular ejection fraction is 55-65% by visual estimate. There is no aortic valve stenosis.   No significant CAD.   Continue preventive therapy.  Lipid Panel    Component Value Date/Time   CHOL 171 07/02/2023 1146   CHOL 155 07/14/2020 1007   TRIG 168.0 (H) 07/02/2023 1146   HDL 45.40 07/02/2023 1146   HDL 38 (L) 07/14/2020 1007   CHOLHDL 4 07/02/2023 1146   VLDL 33.6 07/02/2023 1146   LDLCALC 92  07/02/2023 1146   LDLCALC 84 07/14/2020 1007      Wt Readings from Last 3 Encounters:  03/19/24 (!) 360 lb (163.3 kg)  10/09/23 (!) 367 lb (166.5 kg)  09/12/23 (!) 357 lb (161.9 kg)      ASSESSMENT AND PLAN:  1   Chest pressure  Patient denies     I would follow   Min CAD at cath  2  Dizziness/ sycnope   Currently doing OK on current regimen   No dizziness   Will check labs  Encouraged her to stay hydrated  3  PAF   Keep on current regimen of metoprolol and eliquis     4  HTN   BP is a little high today   with hx of syncope I would recomm she follow      5  HL  Continue lipitor      Current medicines are reviewed at length with the patient today.  The patient does not have concerns regarding medicines.  Signed, Vina Gull, MD  05/11/2024 8:12 PM    [facility] [address] [place], [place], [place]  [address] Phone: [phone]; Fax: [phone]

## 2024-05-13 ENCOUNTER — Ambulatory Visit: Admitting: Internal Medicine

## 2024-05-21 ENCOUNTER — Other Ambulatory Visit (HOSPITAL_COMMUNITY): Payer: Self-pay | Admitting: Psychiatry

## 2024-05-21 ENCOUNTER — Other Ambulatory Visit: Payer: Self-pay | Admitting: Emergency Medicine

## 2024-05-21 DIAGNOSIS — F5105 Insomnia due to other mental disorder: Secondary | ICD-10-CM

## 2024-05-21 DIAGNOSIS — F3160 Bipolar disorder, current episode mixed, unspecified: Secondary | ICD-10-CM

## 2024-05-21 DIAGNOSIS — F411 Generalized anxiety disorder: Secondary | ICD-10-CM

## 2024-06-05 ENCOUNTER — Other Ambulatory Visit: Payer: Self-pay

## 2024-06-05 ENCOUNTER — Encounter (HOSPITAL_COMMUNITY): Payer: Self-pay | Admitting: Psychiatry

## 2024-06-05 ENCOUNTER — Ambulatory Visit (HOSPITAL_BASED_OUTPATIENT_CLINIC_OR_DEPARTMENT_OTHER): Admitting: Psychiatry

## 2024-06-05 VITALS — BP 155/87 | HR 106 | Ht 69.0 in | Wt 357.0 lb

## 2024-06-05 DIAGNOSIS — F411 Generalized anxiety disorder: Secondary | ICD-10-CM | POA: Diagnosis not present

## 2024-06-05 DIAGNOSIS — Z79899 Other long term (current) drug therapy: Secondary | ICD-10-CM

## 2024-06-05 DIAGNOSIS — F5105 Insomnia due to other mental disorder: Secondary | ICD-10-CM | POA: Diagnosis not present

## 2024-06-05 DIAGNOSIS — R251 Tremor, unspecified: Secondary | ICD-10-CM | POA: Diagnosis not present

## 2024-06-05 DIAGNOSIS — F3160 Bipolar disorder, current episode mixed, unspecified: Secondary | ICD-10-CM

## 2024-06-05 MED ORDER — TRAZODONE HCL 100 MG PO TABS: 100.0000 mg | ORAL_TABLET | Freq: Every day | ORAL | 1 refills | Status: AC

## 2024-06-05 MED ORDER — VORTIOXETINE HBR 10 MG PO TABS: 10.0000 mg | ORAL_TABLET | Freq: Every day | ORAL | 1 refills | Status: AC

## 2024-06-05 MED ORDER — DIVALPROEX SODIUM 500 MG PO DR TAB: 1000.0000 mg | DELAYED_RELEASE_TABLET | Freq: Every day | ORAL | 1 refills | Status: AC

## 2024-06-05 MED ORDER — BENZTROPINE MESYLATE 0.5 MG PO TABS: 0.5000 mg | ORAL_TABLET | Freq: Every day | ORAL | 1 refills | Status: AC

## 2024-06-05 MED ORDER — RISPERIDONE 3 MG PO TABS: 6.0000 mg | ORAL_TABLET | Freq: Every day | ORAL | 1 refills | Status: AC

## 2024-06-05 MED ORDER — DULOXETINE HCL 30 MG PO CPEP: 30.0000 mg | ORAL_CAPSULE | Freq: Every day | ORAL | 0 refills | Status: AC

## 2024-06-05 NOTE — Progress Notes (Signed)
 [facility] MD/PA/NP OP Progress Note  06/05/2024 1:11 PM Allison Sharp  MRN:  [id number]  Chief Complaint:  Chief Complaint  Patient presents with   Follow-up   HPI: Patient came to the office for her appointment.  This is the in person visit after a while.  She reported things are not going very well because her 71-year-old sister Odetta showing signs of dementia and she is very concerned about her sister.  She feel more stressed on her shoulder and she tried to take her to the doctor's appointment but patient herself also very anxious about driving.  Patient told that she does not want her sister to be in nursing home but also admitted sister do not have evaluation from neurology.  Patient reported lack of energy, motivation.  She is taking Cymbalta , trazodone , respite all and Depakote .  She has tremors mostly in the right hand.  She like to try a different medication because sometimes she feel current medicine is not working.  She also takes Cogentin  to hold the tremors.  She denies any hallucination, paranoia, mood swings.  She denies any active or passive suicidal thoughts but reported a lot of general anxiety and fear.  Her sleep is on and off.  Recently her CPAP machine broke.  She has a visit with her PCP Dr. Purcell and she is going to discuss with PCP if replacement can be done.  She is taking a good dose of Risperdal .  In the past when does try to reduce her symptoms come back.  Patient was seen in the emergency room in October and cardiac workup was done.  She did not require hospitalization.  She was having chest pain which subsided.  Visit Diagnosis:    ICD-10-CM   1. Insomnia due to mental disorder  F51.05 divalproex  (DEPAKOTE ) 500 MG DR tablet    traZODone  (DESYREL ) 100 MG tablet    vortioxetine  HBr (TRINTELLIX ) 10 MG TABS tablet    2. Generalized anxiety disorder  F41.1 DULoxetine  (CYMBALTA ) 30 MG capsule    traZODone  (DESYREL ) 100 MG tablet    vortioxetine  HBr (TRINTELLIX ) 10 MG TABS  tablet    Valproic acid  level    Valproic acid  level    3. Mixed bipolar I disorder (HCC)  F31.60 divalproex  (DEPAKOTE ) 500 MG DR tablet    traZODone  (DESYREL ) 100 MG tablet    risperiDONE  (RISPERDAL ) 3 MG tablet    vortioxetine  HBr (TRINTELLIX ) 10 MG TABS tablet    Valproic acid  level    Valproic acid  level    4. Tremor of right hand  R25.1 benztropine  (COGENTIN ) 0.5 MG tablet    5. Encounter for long-term (current) use of high-risk medication  Z79.899 Valproic acid  level    Valproic acid  level      Past Psychiatric History: Reviewed H/O multiple inpatient. Last admissions in May 2020 at [facility]. H/O suicidal attempt by taking overdose.  Diagnosed with PTSD, depression, bipolar disorder.  Tried Paxil, Prozac, Wellbutrin , Effexor , Lexapro, amitriptyline , Cymbalta , Abilify  (Tremors), Neurontin , trazodone , Thorazine , hydroxyzine , Luvox , Sinequan , Zyprexa , Ambien  and Latuda .  Best responded on Risperdal  and Cymbalta .     Past Medical History:  Past Medical History:  Diagnosis Date   Allergy    Anxiety    Arthritis    Asthma    Bipolar 1 disorder (HCC)    CAD (coronary artery disease)    Cellulitis    CHF (congestive heart failure) (HCC)    diastolic dysfunction   Chronic back pain  Chronic headaches    Complication of anesthesia    States she typically gets sick s/p anesthesia   Contusion of sacrum    COPD (chronic obstructive pulmonary disease) (HCC)    Dementia (HCC)    Depression    Dyspnea    PFT 03/05/09 FEV1 2.77(98%), FVC 3.25(86%), FEV1% 85, TLC 5.88(99%), DLCO 60% ,  Methacholine challenge 03/16/09 normal ,  CT chest 03/12/09 no pulmonary disease   Fungal infection    GERD (gastroesophageal reflux disease)    History of colonoscopy 10/17/2002   by Dr Classie distal non-specific proctitis, small ext hemorrhoids,    HTN (hypertension)    Hyperlipidemia    Hyperthyroidism    radioactive - thyroid  cancer    Migraine headache    Morbid obesity with  body mass index of 50.0-59.9 in adult [facility]) JAN 2011 370 LBS   2004 311 BMI 45.9   Myocardial infarction (HCC)    NOV 1997   OSA on CPAP    she had been on 2L O2 at night but that was stopped   PONV (postoperative nausea and vomiting)    PTSD (post-traumatic stress disorder)    PTSD (post-traumatic stress disorder)    Suicidal ideation    Urine incontinence    Vitamin D  deficiency     Past Surgical History:  Procedure Laterality Date   ABDOMINAL HYSTERECTOMY     sept 1996   APPENDECTOMY     BACK SURGERY  2008   CARDIAC CATHETERIZATION     nov 1997   CHOLECYSTECTOMY     COLONOSCOPY  10/17/2002    Distal proctitis, small external hemorrhoids, otherwise/  normal colonoscopy. Suspect rectal bleeding secondary to hemorrhoids   ESOPHAGOGASTRODUODENOSCOPY  03/18/09   fundic gland polyps/mild gastritis   HERNIA REPAIR  1978   JOINT REPLACEMENT     bil knee replacement   KNEE ARTHROSCOPY     LEFT HEART CATH AND CORONARY ANGIOGRAPHY N/A 08/08/2018   Procedure: LEFT HEART CATH AND CORONARY ANGIOGRAPHY;  Surgeon: Dann Candyce RAMAN, MD;  Location: [facility] INVASIVE CV LAB;  Service: Cardiovascular;  Laterality: N/A;   MULTIPLE EXTRACTIONS WITH ALVEOLOPLASTY N/A 08/16/2015   Procedure: EXTRACTION OF TEETH THREE, SIX, EIGHT, NINE, ELEVEN, FOURTEEN, FIFTEEN, TWENTY-EIGHT WITH ALVEOLOPLASTY;  Surgeon: Glendia Primrose, DDS;  Location: [facility] OR;  Service: Oral Surgery;  Laterality: N/A;   ROTATOR CUFF REPAIR Right    TONSILLECTOMY     TOTAL VAGINAL HYSTERECTOMY     TUBAL LIGATION      Family Psychiatric History: Reviewed  Family History:  Family History  Adopted: Yes  Problem Relation Age of Onset   Hypertension Mother    Bipolar disorder Mother    Dementia Mother    Depression Mother    AAA (abdominal aortic aneurysm) Mother    Coronary artery disease Father    Alcohol abuse Father    Hypertension Brother    Coronary artery disease Brother    Bipolar disorder Brother    Depression Brother     Depression Sister    Paranoid behavior Sister    Cancer Sister        breast   Bipolar disorder Sister    Depression Sister    Hypertension Sister    Arthritis Sister        knee and hand    Irregular heart beat Sister        takes eliquis    Cancer Son        thyroid    Drug abuse  Son    Alcohol abuse Son    Cancer Maternal Grandfather        throat    Cancer Maternal Aunt        breast metastatized to brain   Anesthesia problems Neg Hx    Hypotension Neg Hx    Malignant hyperthermia Neg Hx    Pseudochol deficiency Neg Hx     Social History:  Social History   Socioeconomic History   Marital status: Single    Spouse name: Not on file   Number of children: 2   Years of education: Not on file   Highest education level: Not on file  Occupational History   Occupation: Disabled    Employer: UNEMPLOYED    Comment: back problems  Tobacco Use   Smoking status: Never    Passive exposure: Never   Smokeless tobacco: Never  Vaping Use   Vaping status: Never Used  Substance and Sexual Activity   Alcohol use: Yes    Alcohol/week: 2.0 standard drinks of alcohol    Types: 1 Glasses of wine, 1 Cans of beer per week    Comment: occ   Drug use: No   Sexual activity: Never    Birth control/protection: Abstinence  Other Topics Concern   Not on file  Social History Narrative   Ms.Shukla  is disabled and lives with her sister.    She has two grown sons that she does not see often, as they live in other states. She has 5 grand children.   She has a long history of mental illness including depression, PTSD, suicidal and homicidal ideation.   She has been obese most all of her life. Her weight has significantly impacted her QOL. She recently lost 20 lbs. By decreasing portion size & increasing proteins.    Social Drivers of Health   Tobacco Use: Low Risk (06/05/2024)   Patient History    Smoking Tobacco Use: Never    Smokeless Tobacco Use: Never    Passive Exposure: Never   Financial Resource Strain: Low Risk (10/09/2023)   Overall Financial Resource Strain (CARDIA)    Difficulty of Paying Living Expenses: Not hard at all  Food Insecurity: No Food Insecurity (10/09/2023)   Hunger Vital Sign    Worried About Running Out of Food in the Last Year: Never true    Ran Out of Food in the Last Year: Never true  Transportation Needs: No Transportation Needs (10/09/2023)   PRAPARE - Administrator, Civil Service (Medical): No    Lack of Transportation (Non-Medical): No  Physical Activity: Insufficiently Active (10/09/2023)   Exercise Vital Sign    Days of Exercise per Week: 3 days    Minutes of Exercise per Session: 10 min  Stress: No Stress Concern Present (10/09/2023)   [employer] of Occupational Health - Occupational Stress Questionnaire    Feeling of Stress : Not at all  Social Connections: Socially Isolated (10/09/2023)   Social Connection and Isolation Panel    Frequency of Communication with Friends and Family: Once a week    Frequency of Social Gatherings with Friends and Family: Never    Attends Religious Services: Never    Database Administrator or Organizations: No    Attends Banker Meetings: Never    Marital Status: Never married  Depression (PHQ2-9): Medium Risk (10/09/2023)   Depression (PHQ2-9)    PHQ-2 Score: 6  Alcohol Screen: Low Risk (10/09/2023)   Alcohol Screen  Last Alcohol Screening Score (AUDIT): 1  Housing: Unknown (10/09/2023)   Housing Stability Vital Sign    Unable to Pay for Housing in the Last Year: No    Number of Times Moved in the Last Year: Not on file    Homeless in the Last Year: No  Utilities: Not At Risk (10/09/2023)   AHC Utilities    Threatened with loss of utilities: No  Health Literacy: Adequate Health Literacy (10/09/2023)   B1300 Health Literacy    Frequency of need for help with medical instructions: Never    Allergies: Allergies[1]  Metabolic Disorder Labs: Lab Results   Component Value Date   HGBA1C 5.5 07/02/2023   MPG 105.41 08/08/2018   MPG 108.28 10/22/2017   No results found for: PROLACTIN Lab Results  Component Value Date   CHOL 171 07/02/2023   TRIG 168.0 (H) 07/02/2023   HDL 45.40 07/02/2023   CHOLHDL 4 07/02/2023   VLDL 33.6 07/02/2023   LDLCALC 92 07/02/2023   LDLCALC 84 07/14/2020   Lab Results  Component Value Date   TSH 2.91 07/02/2023   TSH 3.420 07/14/2020    Therapeutic Level Labs: No results found for: LITHIUM Lab Results  Component Value Date   VALPROATE 65.1 07/02/2023   VALPROATE 60.7 01/18/2022   No results found for: CBMZ  Current Medications: Current Outpatient Medications  Medication Sig Dispense Refill   atorvastatin  (LIPITOR) 20 MG tablet TAKE ONE TABLET AT BEDTIME 100 tablet 0   benztropine  (COGENTIN ) 0.5 MG tablet Take 1 tablet (0.5 mg total) by mouth at bedtime. 30 tablet 1   BREZTRI AEROSPHERE 160-9-4.8 MCG/ACT AERO daily. Per patient taking 1 puff daily     cetirizine  (ZYRTEC ) 10 MG tablet TAKE 1 TABLET ONCE DAILY 90 tablet 3   clotrimazole -betamethasone  (LOTRISONE ) cream APPLY TO AFFECTED AREA 2 TIMES A DAY 60 g 0   divalproex  (DEPAKOTE ) 500 MG DR tablet Take 2 tablets (1,000 mg total) by mouth at bedtime. 60 tablet 1   DULoxetine  (CYMBALTA ) 30 MG capsule Take 1 capsule (30 mg total) by mouth daily. 30 capsule 2   DULoxetine  (CYMBALTA ) 60 MG capsule Take 1 capsule (60 mg total) by mouth in the morning. 30 capsule 1   ELIQUIS 5 MG TABS tablet TAKE ONE TABLET TWICE DAILY 60 tablet 0   furosemide  (LASIX ) 20 MG tablet Take 20 mg by mouth as needed. Takes if gaines 3 pounds 30 tablet 3   levothyroxine  (SYNTHROID ) 125 MCG tablet TAKE ONE TABLET BY MOUTH EVERY DAY 90 tablet 3   linaclotide  (LINZESS ) 145 MCG CAPS capsule Take 1 capsule (145 mcg total) by mouth daily before breakfast. 30 capsule 3   metoprolol succinate (TOPROL-XL) 25 MG 24 hr tablet TAKE ONE TABLET ONCE DAILY 90 tablet 1   omeprazole   (PRILOSEC) 20 MG capsule TAKE TWO CAPSULES DAILY 180 capsule 1   risperiDONE  (RISPERDAL ) 3 MG tablet Take 2 tablets (6 mg total) by mouth at bedtime. 60 tablet 1   traZODone  (DESYREL ) 100 MG tablet Take 1 tablet (100 mg total) by mouth at bedtime. 30 tablet 1   No current facility-administered medications for this visit.     Musculoskeletal: Strength & Muscle Tone: decreased Gait & Station: unsteady, needs cain for help walking Patient leans: N/A  Psychiatric Specialty Exam: Review of Systems  Blood pressure (!) 155/87, pulse (!) 106, height 5' 9 (1.753 m), weight (!) 357 lb (161.9 kg), last menstrual period 02/18/1995.Body mass index is 52.72 kg/m.  General Appearance: Casual  Eye Contact:  Fair  Speech:  Slow  Volume:  Decreased  Mood:  Anxious and Dysphoric  Affect:  Depressed  Thought Process:  Descriptions of Associations: Intact  Orientation:  Full (Time, Place, and Person)  Thought Content: Rumination   Suicidal Thoughts:  No  Homicidal Thoughts:  No  Memory:  Immediate;   Fair Recent;   Fair Remote;   Fair  Judgement:  Fair  Insight:  Shallow  Psychomotor Activity:  Decreased and Tremor  Concentration:  Concentration: Fair and Attention Span: Fair  Recall:  [employer] of Knowledge: Fair  Language: Good  Akathisia:  No  Handed:  Right  AIMS (if indicated): not done  Assets:  Communication Skills Desire for Improvement Housing Transportation  ADL's:  Intact  Cognition: WNL  Sleep:  ok but CPAP broke   Screenings: AIMS    Flowsheet Row [facility]  AIMS Total Score 0   AUDIT    Flowsheet Row [facility] Ad[facility] (Dis[facility]  Alcohol Use Disorder Identification Test Final Score (AUDIT) 0 0   GAD-7    Flowsheet Row Office Visit from  09/12/2023 in [facility] [place] Counselor from 03/23/2022 in [facility] [place]  Total GAD-7 Score 14 14   Mini-Mental    Flowsheet Row Office Visit from 09/16/2018 in [facility] Clinical Support from 11/08/2016 in [facility] Clinical Support from 06/28/2015 in [facility]  Total Score (max 30 points ) 23 30 29    PHQ2-9    Flowsheet Row Clinical Support from 10/09/2023 in [facility] Video Visit from [facility]20[facility] Office Visit from 09[facility]12/2023 in [facility] Video Visit from 07/27/2023 in [facility] Office Visit from 07/02/2023 in [facility] [place]  PHQ-2 Total Score 1 2 4 4 6   PHQ-9 Total Score 6 5 13 9 18    Flowsheet Row ED from 03/19/2024 in [facility] at [facility] Video Visit from 07/27/2023 in [facility] Counselor from 03/23/2022 in [facility] [place]  C-SSRS RISK CATEGORY No Risk Low Risk No Risk     Assessment and Plan: Patient is 69 year old Caucasian female with history of multiple health issues.  She has fatty liver, hypothyroidism, morbid obesity, vitamin D  deficiency, chronic pain, bipolar disorder, PTSD, insomnia and anxiety disorder.  I reviewed blood work results, current medication.  Discussed psychosocial stressors as 70-year-old sister Patty having memory issues.  I encourage her to have evaluation of her sister to establish diagnosis of the memory problem.  The patient is concerned about her sister's memory and promised that she will talk to her sister's PCP soon.  She had not had a Depakote  level since last year.  Will do Depakote   level today.  Continue Cogentin  0.5 mg at bedtime and Risperdal  6 mg daily.  Continue trazodone  1 mg at bedtime.  Patient like to try different medication.  Recommend to cut down Cymbalta  from 90 mg to only 30 mg and we will try Trintellix  5 mg daily for 1 week and then 10 mg.  Explained possibility of withdrawals from Cymbalta .  Historically  patient did very well on Cymbalta  and Depakote  but today she like to try a different medication.  Discussed concerns of withdrawal and some time lack of efficacy.  Patient agreed to give a try.  Will follow-up in 4 to 6 weeks in person.  Collaboration of Care: Collaboration of Care: Other provider involved in patient's care AEB notes are available in epic to review  Patient/Guardian was advised Release of Information must be obtained prior to any record release in order to collaborate their care with an outside provider. Patient/Guardian was advised if they have not already done so to contact the registration department to sign all necessary forms in order for us  to release information regarding their care.   Consent: Patient/Guardian gives verbal consent for treatment and assignment of benefits for services provided during this visit. Patient/Guardian expressed understanding and agreed to proceed.    Leni ONEIDA Client, MD 06/05/2024, 1:11 PM     [1]  Allergies Allergen Reactions   Abilify  [Aripiprazole ] Other (See Comments)    hallucinations   Naproxen  Nausea And Vomiting and Swelling   Ativan [Lorazepam] Other (See Comments)    Delirium   Haldol [Haloperidol] Other (See Comments)    Hallucinating    Hydroxyzine  Other (See Comments)    hallucinations

## 2024-06-05 NOTE — Addendum Note (Signed)
 Addended by: Jerod Mcquain E on: 06/05/2024 01:59 PM   Modules accepted: Orders

## 2024-06-06 ENCOUNTER — Telehealth (HOSPITAL_COMMUNITY): Payer: Self-pay | Admitting: *Deleted

## 2024-06-06 LAB — VALPROIC ACID LEVEL: Valproic Acid Lvl: 51 ug/mL (ref 50–100)

## 2024-06-06 NOTE — Telephone Encounter (Signed)
 Valproic acid  level drawn 06/05/24 resulted this morning @ 51 micrograms/ml.

## 2024-06-10 ENCOUNTER — Ambulatory Visit: Admitting: Emergency Medicine

## 2024-06-20 ENCOUNTER — Encounter: Payer: Self-pay | Admitting: Internal Medicine

## 2024-06-26 ENCOUNTER — Other Ambulatory Visit: Payer: Self-pay | Admitting: Emergency Medicine

## 2024-07-02 ENCOUNTER — Encounter: Admitting: Emergency Medicine

## 2024-07-10 ENCOUNTER — Ambulatory Visit (HOSPITAL_COMMUNITY): Admitting: Psychiatry

## 2024-07-28 ENCOUNTER — Encounter: Admitting: Emergency Medicine

## 2024-10-10 ENCOUNTER — Ambulatory Visit
